# Patient Record
Sex: Female | Born: 1950
Health system: Southern US, Community
[De-identification: ages and names within clinical notes are randomized; demographics above are authoritative.]

## PROBLEM LIST (undated history)

## (undated) DIAGNOSIS — T8859XA Other complications of anesthesia, initial encounter: Secondary | ICD-10-CM

## (undated) DIAGNOSIS — G4730 Sleep apnea, unspecified: Secondary | ICD-10-CM

## (undated) DIAGNOSIS — J45909 Unspecified asthma, uncomplicated: Secondary | ICD-10-CM

## (undated) DIAGNOSIS — R112 Nausea with vomiting, unspecified: Secondary | ICD-10-CM

## (undated) DIAGNOSIS — I10 Essential (primary) hypertension: Secondary | ICD-10-CM

## (undated) DIAGNOSIS — Z0000 Encounter for general adult medical examination without abnormal findings: Secondary | ICD-10-CM

## (undated) DIAGNOSIS — B019 Varicella without complication: Secondary | ICD-10-CM

## (undated) DIAGNOSIS — I2510 Atherosclerotic heart disease of native coronary artery without angina pectoris: Secondary | ICD-10-CM

## (undated) DIAGNOSIS — T4145XA Adverse effect of unspecified anesthetic, initial encounter: Secondary | ICD-10-CM

## (undated) DIAGNOSIS — E049 Nontoxic goiter, unspecified: Secondary | ICD-10-CM

## (undated) DIAGNOSIS — Z87442 Personal history of urinary calculi: Secondary | ICD-10-CM

## (undated) DIAGNOSIS — Z9221 Personal history of antineoplastic chemotherapy: Secondary | ICD-10-CM

## (undated) DIAGNOSIS — K219 Gastro-esophageal reflux disease without esophagitis: Secondary | ICD-10-CM

## (undated) DIAGNOSIS — B059 Measles without complication: Secondary | ICD-10-CM

## (undated) DIAGNOSIS — K439 Ventral hernia without obstruction or gangrene: Secondary | ICD-10-CM

## (undated) DIAGNOSIS — J300 Vasomotor rhinitis: Secondary | ICD-10-CM

## (undated) DIAGNOSIS — F411 Generalized anxiety disorder: Secondary | ICD-10-CM

## (undated) DIAGNOSIS — M1710 Unilateral primary osteoarthritis, unspecified knee: Secondary | ICD-10-CM

## (undated) DIAGNOSIS — K5900 Constipation, unspecified: Secondary | ICD-10-CM

## (undated) DIAGNOSIS — Z860100 Personal history of colon polyps, unspecified: Secondary | ICD-10-CM

## (undated) DIAGNOSIS — G4700 Insomnia, unspecified: Secondary | ICD-10-CM

## (undated) DIAGNOSIS — F419 Anxiety disorder, unspecified: Secondary | ICD-10-CM

## (undated) DIAGNOSIS — R011 Cardiac murmur, unspecified: Secondary | ICD-10-CM

## (undated) DIAGNOSIS — E1165 Type 2 diabetes mellitus with hyperglycemia: Secondary | ICD-10-CM

## (undated) DIAGNOSIS — R062 Wheezing: Secondary | ICD-10-CM

## (undated) DIAGNOSIS — B269 Mumps without complication: Secondary | ICD-10-CM

## (undated) DIAGNOSIS — E785 Hyperlipidemia, unspecified: Secondary | ICD-10-CM

## (undated) DIAGNOSIS — I471 Supraventricular tachycardia: Secondary | ICD-10-CM

## (undated) DIAGNOSIS — Z923 Personal history of irradiation: Secondary | ICD-10-CM

## (undated) DIAGNOSIS — D649 Anemia, unspecified: Secondary | ICD-10-CM

## (undated) DIAGNOSIS — Z9889 Other specified postprocedural states: Secondary | ICD-10-CM

## (undated) DIAGNOSIS — B354 Tinea corporis: Secondary | ICD-10-CM

## (undated) DIAGNOSIS — M2550 Pain in unspecified joint: Secondary | ICD-10-CM

## (undated) DIAGNOSIS — IMO0001 Reserved for inherently not codable concepts without codable children: Secondary | ICD-10-CM

## (undated) DIAGNOSIS — C50412 Malignant neoplasm of upper-outer quadrant of left female breast: Secondary | ICD-10-CM

## (undated) DIAGNOSIS — I77819 Aortic ectasia, unspecified site: Secondary | ICD-10-CM

## (undated) DIAGNOSIS — R300 Dysuria: Secondary | ICD-10-CM

## (undated) DIAGNOSIS — C50919 Malignant neoplasm of unspecified site of unspecified female breast: Secondary | ICD-10-CM

## (undated) DIAGNOSIS — F32A Depression, unspecified: Secondary | ICD-10-CM

## (undated) DIAGNOSIS — I4719 Other supraventricular tachycardia: Secondary | ICD-10-CM

## (undated) DIAGNOSIS — I5032 Chronic diastolic (congestive) heart failure: Secondary | ICD-10-CM

## (undated) DIAGNOSIS — I77810 Thoracic aortic ectasia: Secondary | ICD-10-CM

## (undated) DIAGNOSIS — M549 Dorsalgia, unspecified: Secondary | ICD-10-CM

## (undated) DIAGNOSIS — I493 Ventricular premature depolarization: Secondary | ICD-10-CM

## (undated) DIAGNOSIS — E119 Type 2 diabetes mellitus without complications: Secondary | ICD-10-CM

## (undated) DIAGNOSIS — E669 Obesity, unspecified: Secondary | ICD-10-CM

## (undated) DIAGNOSIS — F329 Major depressive disorder, single episode, unspecified: Secondary | ICD-10-CM

## (undated) DIAGNOSIS — R945 Abnormal results of liver function studies: Secondary | ICD-10-CM

## (undated) HISTORY — DX: Unspecified asthma, uncomplicated: J45.909

## (undated) HISTORY — DX: Constipation, unspecified: K59.00

## (undated) HISTORY — DX: Atherosclerotic heart disease of native coronary artery without angina pectoris: I25.10

## (undated) HISTORY — DX: Major depressive disorder, single episode, unspecified: F32.9

## (undated) HISTORY — DX: Gastro-esophageal reflux disease without esophagitis: K21.9

## (undated) HISTORY — DX: Abnormal results of liver function studies: R94.5

## (undated) HISTORY — DX: Type 2 diabetes mellitus without complications: E11.9

## (undated) HISTORY — PX: CHOLECYSTECTOMY: SHX55

## (undated) HISTORY — DX: Anemia, unspecified: D64.9

## (undated) HISTORY — DX: Anxiety disorder, unspecified: F41.9

## (undated) HISTORY — DX: Pain in unspecified joint: M25.50

## (undated) HISTORY — DX: Other supraventricular tachycardia: I47.19

## (undated) HISTORY — PX: TUBAL LIGATION: SHX77

## (undated) HISTORY — DX: Ventricular premature depolarization: I49.3

## (undated) HISTORY — DX: Hyperlipidemia, unspecified: E78.5

## (undated) HISTORY — DX: Personal history of irradiation: Z92.3

## (undated) HISTORY — DX: Ventral hernia without obstruction or gangrene: K43.9

## (undated) HISTORY — DX: Thoracic aortic ectasia: I77.810

## (undated) HISTORY — DX: Supraventricular tachycardia: I47.1

## (undated) HISTORY — DX: Wheezing: R06.2

## (undated) HISTORY — DX: Measles without complication: B05.9

## (undated) HISTORY — DX: Encounter for general adult medical examination without abnormal findings: Z00.00

## (undated) HISTORY — DX: Malignant neoplasm of upper-outer quadrant of left female breast: C50.412

## (undated) HISTORY — DX: Personal history of colon polyps, unspecified: Z86.0100

## (undated) HISTORY — DX: Depression, unspecified: F32.A

## (undated) HISTORY — DX: Varicella without complication: B01.9

## (undated) HISTORY — PX: JOINT REPLACEMENT: SHX530

## (undated) HISTORY — DX: Tinea corporis: B35.4

## (undated) HISTORY — DX: Obesity, unspecified: E66.9

## (undated) HISTORY — DX: Personal history of colonic polyps: Z86.010

## (undated) HISTORY — DX: Essential (primary) hypertension: I10

## (undated) HISTORY — DX: Vasomotor rhinitis: J30.0

## (undated) HISTORY — DX: Mumps without complication: B26.9

## (undated) HISTORY — DX: Dorsalgia, unspecified: M54.9

## (undated) HISTORY — DX: Aortic ectasia, unspecified site: I77.819

## (undated) HISTORY — PX: CARDIAC CATHETERIZATION: SHX172

## (undated) HISTORY — PX: CARDIOVASCULAR STRESS TEST: SHX262

## (undated) HISTORY — DX: Generalized anxiety disorder: F41.1

## (undated) HISTORY — DX: Chronic diastolic (congestive) heart failure: I50.32

## (undated) HISTORY — DX: Dysuria: R30.0

## (undated) HISTORY — DX: Sleep apnea, unspecified: G47.30

## (undated) HISTORY — DX: Unilateral primary osteoarthritis, unspecified knee: M17.10

## (undated) HISTORY — DX: Malignant neoplasm of unspecified site of unspecified female breast: C50.919

## (undated) HISTORY — DX: Type 2 diabetes mellitus with hyperglycemia: E11.65

## (undated) HISTORY — DX: Insomnia, unspecified: G47.00

## (undated) HISTORY — PX: MOUTH SURGERY: SHX715

---

## 1998-06-24 HISTORY — PX: WISDOM TOOTH EXTRACTION: SHX21

## 1999-03-12 ENCOUNTER — Emergency Department (HOSPITAL_COMMUNITY): Admission: EM | Admit: 1999-03-12 | Discharge: 1999-03-12 | Payer: Self-pay | Admitting: Emergency Medicine

## 1999-11-14 ENCOUNTER — Encounter: Admission: RE | Admit: 1999-11-14 | Discharge: 1999-11-14 | Payer: Self-pay | Admitting: Obstetrics and Gynecology

## 1999-11-14 ENCOUNTER — Encounter: Payer: Self-pay | Admitting: Obstetrics and Gynecology

## 2001-03-12 ENCOUNTER — Encounter: Payer: Self-pay | Admitting: Obstetrics and Gynecology

## 2001-03-12 ENCOUNTER — Encounter: Admission: RE | Admit: 2001-03-12 | Discharge: 2001-03-12 | Payer: Self-pay | Admitting: Obstetrics and Gynecology

## 2001-10-21 ENCOUNTER — Encounter: Payer: Self-pay | Admitting: Internal Medicine

## 2002-04-12 ENCOUNTER — Encounter: Admission: RE | Admit: 2002-04-12 | Discharge: 2002-04-12 | Payer: Self-pay | Admitting: Internal Medicine

## 2002-04-12 ENCOUNTER — Encounter: Payer: Self-pay | Admitting: Internal Medicine

## 2002-08-17 ENCOUNTER — Encounter: Payer: Self-pay | Admitting: Internal Medicine

## 2002-09-29 ENCOUNTER — Encounter: Payer: Self-pay | Admitting: Cardiology

## 2002-09-29 ENCOUNTER — Ambulatory Visit (HOSPITAL_COMMUNITY): Admission: RE | Admit: 2002-09-29 | Discharge: 2002-09-29 | Payer: Self-pay | Admitting: Cardiology

## 2002-10-18 ENCOUNTER — Ambulatory Visit (HOSPITAL_COMMUNITY): Admission: RE | Admit: 2002-10-18 | Discharge: 2002-10-18 | Payer: Self-pay | Admitting: Cardiology

## 2002-10-18 ENCOUNTER — Encounter (INDEPENDENT_AMBULATORY_CARE_PROVIDER_SITE_OTHER): Payer: Self-pay | Admitting: Cardiology

## 2003-02-22 ENCOUNTER — Other Ambulatory Visit: Admission: RE | Admit: 2003-02-22 | Discharge: 2003-02-22 | Payer: Self-pay | Admitting: *Deleted

## 2004-03-13 ENCOUNTER — Other Ambulatory Visit: Admission: RE | Admit: 2004-03-13 | Discharge: 2004-03-13 | Payer: Self-pay | Admitting: Obstetrics and Gynecology

## 2004-05-02 ENCOUNTER — Ambulatory Visit: Payer: Self-pay | Admitting: Gastroenterology

## 2004-05-02 ENCOUNTER — Encounter: Payer: Self-pay | Admitting: Internal Medicine

## 2004-05-09 ENCOUNTER — Encounter (INDEPENDENT_AMBULATORY_CARE_PROVIDER_SITE_OTHER): Payer: Self-pay | Admitting: *Deleted

## 2004-05-09 ENCOUNTER — Ambulatory Visit (HOSPITAL_COMMUNITY): Admission: RE | Admit: 2004-05-09 | Discharge: 2004-05-09 | Payer: Self-pay | Admitting: Obstetrics and Gynecology

## 2004-06-01 ENCOUNTER — Encounter: Payer: Self-pay | Admitting: Internal Medicine

## 2004-06-01 ENCOUNTER — Ambulatory Visit: Payer: Self-pay | Admitting: Pulmonary Disease

## 2004-06-03 ENCOUNTER — Ambulatory Visit: Payer: Self-pay | Admitting: Pulmonary Disease

## 2004-06-03 ENCOUNTER — Ambulatory Visit (HOSPITAL_BASED_OUTPATIENT_CLINIC_OR_DEPARTMENT_OTHER): Admission: RE | Admit: 2004-06-03 | Discharge: 2004-06-03 | Payer: Self-pay | Admitting: Pulmonary Disease

## 2004-06-27 ENCOUNTER — Ambulatory Visit: Payer: Self-pay | Admitting: Pulmonary Disease

## 2004-07-26 ENCOUNTER — Ambulatory Visit: Payer: Self-pay | Admitting: Pulmonary Disease

## 2004-09-12 ENCOUNTER — Ambulatory Visit: Payer: Self-pay | Admitting: Internal Medicine

## 2004-09-24 ENCOUNTER — Ambulatory Visit: Payer: Self-pay | Admitting: Internal Medicine

## 2004-12-10 ENCOUNTER — Encounter (INDEPENDENT_AMBULATORY_CARE_PROVIDER_SITE_OTHER): Payer: Self-pay | Admitting: *Deleted

## 2004-12-10 ENCOUNTER — Encounter: Payer: Self-pay | Admitting: Internal Medicine

## 2004-12-10 ENCOUNTER — Ambulatory Visit (HOSPITAL_COMMUNITY): Admission: RE | Admit: 2004-12-10 | Discharge: 2004-12-10 | Payer: Self-pay | Admitting: Gastroenterology

## 2005-01-11 ENCOUNTER — Ambulatory Visit: Payer: Self-pay | Admitting: Internal Medicine

## 2005-04-17 ENCOUNTER — Ambulatory Visit: Payer: Self-pay | Admitting: Internal Medicine

## 2005-04-24 ENCOUNTER — Ambulatory Visit: Payer: Self-pay | Admitting: Internal Medicine

## 2005-05-08 ENCOUNTER — Other Ambulatory Visit: Admission: RE | Admit: 2005-05-08 | Discharge: 2005-05-08 | Payer: Self-pay | Admitting: Obstetrics and Gynecology

## 2005-05-15 ENCOUNTER — Ambulatory Visit: Payer: Self-pay | Admitting: Internal Medicine

## 2005-06-14 ENCOUNTER — Ambulatory Visit: Payer: Self-pay | Admitting: Internal Medicine

## 2005-08-05 ENCOUNTER — Ambulatory Visit: Payer: Self-pay | Admitting: Internal Medicine

## 2005-10-03 ENCOUNTER — Ambulatory Visit: Payer: Self-pay | Admitting: Internal Medicine

## 2006-01-22 ENCOUNTER — Ambulatory Visit: Payer: Self-pay | Admitting: Internal Medicine

## 2006-01-29 ENCOUNTER — Ambulatory Visit: Payer: Self-pay | Admitting: Internal Medicine

## 2006-05-20 ENCOUNTER — Emergency Department (HOSPITAL_COMMUNITY): Admission: EM | Admit: 2006-05-20 | Discharge: 2006-05-20 | Payer: Self-pay | Admitting: Emergency Medicine

## 2006-07-30 ENCOUNTER — Ambulatory Visit: Payer: Self-pay | Admitting: Internal Medicine

## 2006-07-30 LAB — CONVERTED CEMR LAB
ALT: 32 units/L (ref 0–40)
AST: 27 units/L (ref 0–37)
Albumin: 3.6 g/dL (ref 3.5–5.2)
Alkaline Phosphatase: 69 units/L (ref 39–117)
BUN: 17 mg/dL (ref 6–23)
Basophils Absolute: 0 10*3/uL (ref 0.0–0.1)
Basophils Relative: 0.4 % (ref 0.0–1.0)
Bilirubin, Direct: 0.1 mg/dL (ref 0.0–0.3)
CO2: 31 meq/L (ref 19–32)
Calcium: 9.6 mg/dL (ref 8.4–10.5)
Chloride: 102 meq/L (ref 96–112)
Cholesterol: 211 mg/dL (ref 0–200)
Creatinine, Ser: 0.8 mg/dL (ref 0.4–1.2)
Direct LDL: 130.6 mg/dL
Eosinophils Absolute: 0.1 10*3/uL (ref 0.0–0.6)
Eosinophils Relative: 1 % (ref 0.0–5.0)
GFR calc Af Amer: 96 mL/min
GFR calc non Af Amer: 79 mL/min
Glucose, Bld: 73 mg/dL (ref 70–99)
HCT: 40.1 % (ref 36.0–46.0)
HDL: 31.8 mg/dL — ABNORMAL LOW (ref >39.0)
Hemoglobin: 14.1 g/dL (ref 12.0–15.0)
Lymphocytes Relative: 30 % (ref 12.0–46.0)
MCHC: 35.2 g/dL (ref 30.0–36.0)
MCV: 90.7 fL (ref 78.0–100.0)
Monocytes Absolute: 0.3 10*3/uL (ref 0.2–0.7)
Monocytes Relative: 5.8 % (ref 3.0–11.0)
Neutro Abs: 3.4 10*3/uL (ref 1.4–7.7)
Neutrophils Relative %: 62.8 % (ref 43.0–77.0)
Platelets: 243 10*3/uL (ref 150–400)
Potassium: 3.7 meq/L (ref 3.5–5.1)
RBC: 4.42 M/uL (ref 3.87–5.11)
RDW: 12.7 % (ref 11.5–14.6)
Sodium: 143 meq/L (ref 135–145)
TSH: 1.66 microintl units/mL (ref 0.35–5.50)
Total Bilirubin: 0.6 mg/dL (ref 0.3–1.2)
Total CHOL/HDL Ratio: 6.6
Total Protein: 7.1 g/dL (ref 6.0–8.3)
Triglycerides: 193 mg/dL — ABNORMAL HIGH (ref 0–149)
VLDL: 39 mg/dL (ref 0–40)
WBC: 5.4 10*3/uL (ref 4.5–10.5)

## 2006-08-06 ENCOUNTER — Ambulatory Visit: Payer: Self-pay | Admitting: Internal Medicine

## 2006-08-20 ENCOUNTER — Encounter: Admission: RE | Admit: 2006-08-20 | Discharge: 2006-11-18 | Payer: Self-pay | Admitting: Internal Medicine

## 2006-09-17 ENCOUNTER — Ambulatory Visit: Payer: Self-pay | Admitting: Internal Medicine

## 2006-09-17 LAB — CONVERTED CEMR LAB
ALT: 28 units/L (ref 0–40)
AST: 22 units/L (ref 0–37)
Albumin: 3.7 g/dL (ref 3.5–5.2)
Alkaline Phosphatase: 70 units/L (ref 39–117)
Bilirubin, Direct: 0.1 mg/dL (ref 0.0–0.3)
Cholesterol: 180 mg/dL (ref 0–200)
Direct LDL: 114.7 mg/dL
HDL: 31.3 mg/dL — ABNORMAL LOW (ref >39.0)
Total Bilirubin: 0.7 mg/dL (ref 0.3–1.2)
Total CHOL/HDL Ratio: 5.8
Total Protein: 6.8 g/dL (ref 6.0–8.3)
Triglycerides: 218 mg/dL (ref 0–149)
VLDL: 44 mg/dL — ABNORMAL HIGH (ref 0–40)

## 2006-10-17 ENCOUNTER — Ambulatory Visit: Payer: Self-pay | Admitting: Internal Medicine

## 2007-03-26 ENCOUNTER — Telehealth: Payer: Self-pay | Admitting: Internal Medicine

## 2007-05-25 ENCOUNTER — Telehealth: Payer: Self-pay | Admitting: Internal Medicine

## 2007-06-25 HISTORY — PX: MENISCUS REPAIR: SHX5179

## 2007-06-25 LAB — CONVERTED CEMR LAB: Pap Smear: NORMAL

## 2007-09-02 ENCOUNTER — Ambulatory Visit: Payer: Self-pay | Admitting: Internal Medicine

## 2007-09-02 LAB — CONVERTED CEMR LAB
ALT: 25 units/L (ref 0–35)
Albumin: 3.8 g/dL (ref 3.5–5.2)
Alkaline Phosphatase: 81 units/L (ref 39–117)
BUN: 14 mg/dL (ref 6–23)
Basophils Absolute: 0 10*3/uL (ref 0.0–0.1)
Bilirubin, Direct: 0.1 mg/dL (ref 0.0–0.3)
Blood in Urine, dipstick: NEGATIVE
Calcium: 9.8 mg/dL (ref 8.4–10.5)
Chloride: 107 meq/L (ref 96–112)
Cholesterol: 190 mg/dL (ref 0–200)
Eosinophils Absolute: 0.1 10*3/uL (ref 0.0–0.6)
GFR calc Af Amer: 95 mL/min
GFR calc non Af Amer: 79 mL/min
Glucose, Bld: 98 mg/dL (ref 70–99)
HDL: 26.4 mg/dL — ABNORMAL LOW (ref >39.0)
Lymphocytes Relative: 22.8 % (ref 12.0–46.0)
MCHC: 33.7 g/dL (ref 30.0–36.0)
MCV: 90.6 fL (ref 78.0–100.0)
Monocytes Relative: 5.6 % (ref 3.0–11.0)
Neutro Abs: 5 10*3/uL (ref 1.4–7.7)
Nitrite: NEGATIVE
Platelets: 244 10*3/uL (ref 150–400)
RBC: 4.63 M/uL (ref 3.87–5.11)
Specific Gravity, Urine: 1.025
Triglycerides: 212 mg/dL (ref 0–149)
Urobilinogen, UA: 0.2

## 2007-09-11 DIAGNOSIS — N200 Calculus of kidney: Secondary | ICD-10-CM | POA: Insufficient documentation

## 2007-09-11 DIAGNOSIS — Z860100 Personal history of colon polyps, unspecified: Secondary | ICD-10-CM | POA: Insufficient documentation

## 2007-09-11 DIAGNOSIS — F329 Major depressive disorder, single episode, unspecified: Secondary | ICD-10-CM

## 2007-09-11 DIAGNOSIS — F419 Anxiety disorder, unspecified: Secondary | ICD-10-CM | POA: Insufficient documentation

## 2007-09-11 DIAGNOSIS — I10 Essential (primary) hypertension: Secondary | ICD-10-CM | POA: Insufficient documentation

## 2007-09-11 DIAGNOSIS — N281 Cyst of kidney, acquired: Secondary | ICD-10-CM | POA: Insufficient documentation

## 2007-09-11 DIAGNOSIS — F411 Generalized anxiety disorder: Secondary | ICD-10-CM

## 2007-09-11 DIAGNOSIS — K219 Gastro-esophageal reflux disease without esophagitis: Secondary | ICD-10-CM | POA: Insufficient documentation

## 2007-09-11 DIAGNOSIS — E785 Hyperlipidemia, unspecified: Secondary | ICD-10-CM | POA: Insufficient documentation

## 2007-09-11 DIAGNOSIS — F32A Depression, unspecified: Secondary | ICD-10-CM | POA: Insufficient documentation

## 2007-09-11 HISTORY — DX: Generalized anxiety disorder: F41.1

## 2007-09-14 ENCOUNTER — Ambulatory Visit: Payer: Self-pay | Admitting: Internal Medicine

## 2007-09-14 ENCOUNTER — Telehealth (INDEPENDENT_AMBULATORY_CARE_PROVIDER_SITE_OTHER): Payer: Self-pay | Admitting: *Deleted

## 2007-09-14 LAB — HM MAMMOGRAPHY

## 2007-11-06 ENCOUNTER — Ambulatory Visit: Payer: Self-pay | Admitting: Internal Medicine

## 2007-11-06 DIAGNOSIS — M25569 Pain in unspecified knee: Secondary | ICD-10-CM | POA: Insufficient documentation

## 2007-11-09 ENCOUNTER — Ambulatory Visit: Payer: Self-pay | Admitting: Internal Medicine

## 2007-11-11 ENCOUNTER — Telehealth (INDEPENDENT_AMBULATORY_CARE_PROVIDER_SITE_OTHER): Payer: Self-pay | Admitting: *Deleted

## 2007-11-27 ENCOUNTER — Ambulatory Visit (HOSPITAL_COMMUNITY): Admission: RE | Admit: 2007-11-27 | Discharge: 2007-11-27 | Payer: Self-pay | Admitting: Specialist

## 2007-12-06 ENCOUNTER — Emergency Department (HOSPITAL_COMMUNITY): Admission: EM | Admit: 2007-12-06 | Discharge: 2007-12-06 | Payer: Self-pay | Admitting: Family Medicine

## 2007-12-11 ENCOUNTER — Ambulatory Visit (HOSPITAL_BASED_OUTPATIENT_CLINIC_OR_DEPARTMENT_OTHER): Admission: RE | Admit: 2007-12-11 | Discharge: 2007-12-11 | Payer: Self-pay | Admitting: Specialist

## 2007-12-22 ENCOUNTER — Encounter: Admission: RE | Admit: 2007-12-22 | Discharge: 2008-03-01 | Payer: Self-pay | Admitting: Specialist

## 2008-06-08 ENCOUNTER — Encounter: Payer: Self-pay | Admitting: Internal Medicine

## 2008-06-14 ENCOUNTER — Encounter (HOSPITAL_COMMUNITY): Admission: RE | Admit: 2008-06-14 | Discharge: 2008-06-21 | Payer: Self-pay | Admitting: Cardiology

## 2008-06-24 LAB — HM COLONOSCOPY

## 2008-07-13 ENCOUNTER — Encounter: Payer: Self-pay | Admitting: Internal Medicine

## 2008-07-16 ENCOUNTER — Ambulatory Visit (HOSPITAL_BASED_OUTPATIENT_CLINIC_OR_DEPARTMENT_OTHER): Admission: RE | Admit: 2008-07-16 | Discharge: 2008-07-16 | Payer: Self-pay | Admitting: Cardiology

## 2008-07-23 ENCOUNTER — Ambulatory Visit: Payer: Self-pay | Admitting: Internal Medicine

## 2008-10-06 ENCOUNTER — Encounter: Payer: Self-pay | Admitting: Pulmonary Disease

## 2008-10-13 ENCOUNTER — Encounter: Payer: Self-pay | Admitting: Internal Medicine

## 2008-10-21 ENCOUNTER — Ambulatory Visit (HOSPITAL_COMMUNITY): Admission: RE | Admit: 2008-10-21 | Discharge: 2008-10-21 | Payer: Self-pay | Admitting: Cardiology

## 2008-10-26 ENCOUNTER — Ambulatory Visit (HOSPITAL_COMMUNITY): Admission: RE | Admit: 2008-10-26 | Discharge: 2008-10-26 | Payer: Self-pay | Admitting: Cardiology

## 2008-11-14 ENCOUNTER — Encounter: Payer: Self-pay | Admitting: Internal Medicine

## 2008-11-16 ENCOUNTER — Ambulatory Visit: Payer: Self-pay | Admitting: Internal Medicine

## 2008-11-16 LAB — CONVERTED CEMR LAB
Cholesterol, target level: 200 mg/dL
LDL Goal: 130 mg/dL

## 2008-11-22 LAB — CONVERTED CEMR LAB
Alkaline Phosphatase: 66 units/L (ref 39–117)
BUN: 25 mg/dL — ABNORMAL HIGH (ref 6–23)
Bilirubin, Direct: 0 mg/dL (ref 0.0–0.3)
Calcium: 9.6 mg/dL (ref 8.4–10.5)
Chloride: 107 meq/L (ref 96–112)
Cholesterol: 188 mg/dL (ref 0–200)
Creatinine, Ser: 0.8 mg/dL (ref 0.4–1.2)
LDL Cholesterol: 113 mg/dL — ABNORMAL HIGH (ref 0–99)
Rhuematoid fact SerPl-aCnc: <20 intl units/mL (ref 0.0–20.0)
Total CHOL/HDL Ratio: 5
Total Protein: 7.6 g/dL (ref 6.0–8.3)

## 2008-11-28 ENCOUNTER — Telehealth: Payer: Self-pay | Admitting: Internal Medicine

## 2008-11-29 ENCOUNTER — Telehealth: Payer: Self-pay | Admitting: Internal Medicine

## 2008-12-23 ENCOUNTER — Emergency Department (HOSPITAL_COMMUNITY): Admission: EM | Admit: 2008-12-23 | Discharge: 2008-12-23 | Payer: Self-pay | Admitting: Family Medicine

## 2009-01-27 ENCOUNTER — Telehealth: Payer: Self-pay | Admitting: Internal Medicine

## 2009-06-15 ENCOUNTER — Ambulatory Visit: Payer: Self-pay | Admitting: Internal Medicine

## 2009-06-21 LAB — CONVERTED CEMR LAB
Alkaline Phosphatase: 67 units/L (ref 39–117)
BUN: 19 mg/dL (ref 6–23)
Bilirubin, Direct: 0 mg/dL (ref 0.0–0.3)
CO2: 31 meq/L (ref 19–32)
Chloride: 104 meq/L (ref 96–112)
Creatinine, Ser: 0.8 mg/dL (ref 0.4–1.2)
LDL Cholesterol: 100 mg/dL — ABNORMAL HIGH (ref 0–99)
Total CHOL/HDL Ratio: 5

## 2009-06-24 HISTORY — PX: TOTAL KNEE ARTHROPLASTY: SHX125

## 2009-08-16 ENCOUNTER — Ambulatory Visit: Payer: Self-pay | Admitting: Family Medicine

## 2009-12-06 ENCOUNTER — Encounter: Payer: Self-pay | Admitting: Internal Medicine

## 2010-01-03 ENCOUNTER — Ambulatory Visit (HOSPITAL_COMMUNITY): Admission: RE | Admit: 2010-01-03 | Discharge: 2010-01-03 | Payer: Self-pay | Admitting: Gastroenterology

## 2010-02-06 ENCOUNTER — Telehealth: Payer: Self-pay | Admitting: Internal Medicine

## 2010-03-08 ENCOUNTER — Telehealth: Payer: Self-pay | Admitting: Internal Medicine

## 2010-03-09 ENCOUNTER — Emergency Department (HOSPITAL_COMMUNITY): Admission: EM | Admit: 2010-03-09 | Discharge: 2010-03-09 | Payer: Self-pay | Admitting: Family Medicine

## 2010-03-22 ENCOUNTER — Telehealth: Payer: Self-pay | Admitting: Internal Medicine

## 2010-03-27 ENCOUNTER — Ambulatory Visit: Payer: Self-pay | Admitting: Internal Medicine

## 2010-03-27 LAB — HM COLONOSCOPY

## 2010-04-18 ENCOUNTER — Ambulatory Visit (HOSPITAL_COMMUNITY): Admission: RE | Admit: 2010-04-18 | Discharge: 2010-04-18 | Payer: Self-pay | Admitting: Specialist

## 2010-04-27 ENCOUNTER — Inpatient Hospital Stay (HOSPITAL_COMMUNITY): Admission: RE | Admit: 2010-04-27 | Discharge: 2010-04-30 | Payer: Self-pay | Admitting: Specialist

## 2010-05-03 ENCOUNTER — Telehealth: Payer: Self-pay | Admitting: Internal Medicine

## 2010-05-21 ENCOUNTER — Encounter
Admission: RE | Admit: 2010-05-21 | Discharge: 2010-06-21 | Payer: Self-pay | Source: Home / Self Care | Attending: Specialist | Admitting: Specialist

## 2010-05-24 HISTORY — PX: KNEE ARTHROSCOPY: SUR90

## 2010-06-05 ENCOUNTER — Ambulatory Visit
Admission: RE | Admit: 2010-06-05 | Discharge: 2010-06-05 | Payer: Self-pay | Source: Home / Self Care | Attending: Specialist | Admitting: Specialist

## 2010-06-25 ENCOUNTER — Encounter
Admission: RE | Admit: 2010-06-25 | Discharge: 2010-07-24 | Payer: Self-pay | Source: Home / Self Care | Attending: Specialist | Admitting: Specialist

## 2010-07-26 NOTE — Assessment & Plan Note (Signed)
Summary: SURGICAL CLEARANCE PER DR SWORDS//CCM   Vital Signs:  Patient profile:   60 year old female Weight:      300 pounds BMI:     51.68 Temp:     98.7 degrees F oral Pulse rate:   60 / minute Pulse rhythm:   regular Resp:     12 per minute BP sitting:   160 / 94  (left arm) Cuff size:   large  Vitals Entered By: Sid Falcon LPN (March 27, 2010 8:34 AM)  Serial Vital Signs/Assessments:  Time      Position  BP       Pulse  Resp  Temp     By                     130/90                         Birdie Sons MD   History of Present Illness: referred for preop evaluation by dr Thomasena Edis She has endstage OA of left knee likely secondary to morbid obesity She is scheduled for surgery She has never had trouble with General Anesthesia she denies chest pain , SOB. She has DOE---chronic and unchanged  She uses a cane/crutch for ambulation  HTN---tolerating meds, monitoring BP at home 120s/mid-80s  All other systems reviewed and were negative   Current Problems (verified): 1)  Knee Pain  (ICD-719.46) 2)  Health Screening  (ICD-V70.0) 3)  Anxiety  (ICD-300.00) 4)  Renal Cyst  (ICD-593.2) 5)  Renal Calculus  (ICD-592.0) 6)  Hypertension  (ICD-401.9) 7)  Hyperlipidemia  (ICD-272.4) 8)  Gerd  (ICD-530.81) 9)  Colonic Polyps, Hx of  (ICD-V12.72)  Current Medications (verified): 1)  Aspir-81 81 Mg Tbec (Aspirin) .... Take 1 Tablet By Mouth Once A Day 2)  Celexa 20 Mg Tabs (Citalopram Hydrobromide) .... Take 1 Tablet By Mouth Once A Day 3)  Metoprolol Tartrate 50 Mg Tabs (Metoprolol Tartrate) .... Take 1 Tablet By Mouth Twice A Day 4)  Nexium 40 Mg Cpdr (Esomeprazole Magnesium) .... Take 1 Tab Each Morning 5)  Simvastatin 40 Mg Tabs (Simvastatin) .... Take 1 Tablet By Mouth At Bedtime 6)  Multivitamins   Tabs (Multiple Vitamin) .... Once Daily 7)  Hydralazine Hcl 25 Mg Tabs (Hydralazine Hcl) .Marland Kitchen.. 1 Two Times A Day 8)  Diclofenac Sodium 75 Mg Tbec (Diclofenac Sodium) .... One  Bid 9)  Losartan Potassium-Hctz 100-25 Mg Tabs (Losartan Potassium-Hctz) .... Take 1 Tablet By Mouth Once A Day 10)  Fluticasone Propionate 50 Mcg/act  Susp (Fluticasone Propionate) .... 2 Sprays Each Nostril Once Daily 11)  Hydrocodone-Homatropine 5-1.5 Mg/63ml Syrp (Hydrocodone-Homatropine) .... One Tsp By Mouth Q 4-6 Hours As Needed Cough  Allergies (verified): No Known Drug Allergies  Past History:  Past Medical History: Last updated: 09/11/2007 Colonic polyps, hx of GERD Hyperlipidemia Hypertension kidney stone "kidney cyst" Anxiety---insomnia  Past Surgical History: Last updated: 09/11/2007 Caesarean section x3 Cholecystectomy  Family History: Last updated: 11/16/2008 Father: MI age 63,  HTN< rheumatoid arthritis  alive 2002 Mother: thyroid problems,  high cholesterol  alive 2002 Siblings: 1 brother healthy Family History Hypertension Family History High cholesterol Family History of Colon CA 1st degree relative <60--grandmother and maternal aunt Father CAD Brother CAD  Social History: Last updated: 11/06/2007 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   Eyes:  pupils equal and pupils round.   Ears:  R ear normal and L ear normal.   Nose:  no external deformity and no external erythema.   Neck:  No deformities, masses, or tenderness noted. Chest Wall:  No deformities, masses, or tenderness noted. Lungs:  Normal respiratory effort, chest expands symmetrically. Lungs are clear to auscultation, no crackles or wheezes. Heart:  Normal rate and regular rhythm. S1 and S2 normal without gallop, murmur, click, rub or other extra sounds. Abdomen:  Actonel sounds, soft and nontender.  Morbidly  obese. Msk:  No deformity or scoliosis noted of thoracic or lumbar spine.   Neurologic:  cranial nerves II-XII intact and gait normal.   Skin:  turgor normal and color normal.   Psych:  good eye contact and not anxious appearing.     Impression & Recommendations:  Problem # 1:  KNEE PAIN (YQM-578.46) Assessment Deteriorated End stage OA scheduled for surgery she is cleared for surgery and sh accepts arr associated risks Her updated medication list for this problem includes:    Aspir-81 81 Mg Tbec (Aspirin) .Marland Kitchen... Take 1 tablet by mouth once a day    Diclofenac Sodium 75 Mg Tbec (Diclofenac sodium) ..... One two times a day  has also been evaluated by CV---cleared for surgery  Problem # 2:  HYPERTENSION (ICD-401.9)  repeat bp was ok continue current medications  Her updated medication list for this problem includes:    Metoprolol Tartrate 50 Mg Tabs (Metoprolol tartrate) .Marland Kitchen... Take 1 tablet by mouth twice a day    Hydralazine Hcl 25 Mg Tabs (Hydralazine hcl) .Marland Kitchen... 1 two times a day    Losartan Potassium-hctz 100-25 Mg Tabs (Losartan potassium-hctz) .Marland Kitchen... Take 1 tablet by mouth once a day  BP today: 160/94 Prior BP: 130/88 (08/16/2009)  Prior 10 Yr Risk Heart Disease: Not enough information (11/16/2008)  Labs Reviewed: K+: 3.9 (06/15/2009) Creat: : 0.8 (06/15/2009)   Chol: 172 (06/15/2009)   HDL: 32.80 (06/15/2009)   LDL: 100 (06/15/2009)   TG: 197.0 (06/15/2009)  Problem # 3:  HYPERLIPIDEMIA (ICD-272.4) controlled continue current medications  Her updated medication list for this problem includes:    Simvastatin 40 Mg Tabs (Simvastatin) .Marland Kitchen... Take 1 tablet by mouth at bedtime  Labs Reviewed: SGOT: 30 (06/15/2009)   SGPT: 33 (06/15/2009)  Lipid Goals: Chol Goal: 200 (11/16/2008)   HDL Goal: 40 (11/16/2008)   LDL Goal: 130 (11/16/2008)   TG Goal: 150 (11/16/2008)  Prior 10 Yr Risk Heart Disease: Not enough information (11/16/2008)   HDL:32.80 (06/15/2009), 35.20  (11/16/2008)  LDL:100 (06/15/2009), 113 (11/16/2008)  Chol:172 (06/15/2009), 188 (11/16/2008)  Trig:197.0 (06/15/2009), 197.0 (11/16/2008)  Complete Medication List: 1)  Aspir-81 81 Mg Tbec (Aspirin) .... Take 1 tablet by mouth once a day 2)  Celexa 20 Mg Tabs (Citalopram hydrobromide) .... Take 1 tablet by mouth once a day 3)  Metoprolol Tartrate 50 Mg Tabs (Metoprolol tartrate) .... Take 1 tablet by mouth twice a day 4)  Nexium 40 Mg Cpdr (Esomeprazole magnesium) .... Take 1 tab each morning 5)  Simvastatin 40 Mg Tabs (Simvastatin) .... Take 1 tablet by mouth at bedtime 6)  Multivitamins Tabs (Multiple vitamin) .... Once daily 7)  Hydralazine Hcl 25 Mg Tabs (Hydralazine hcl) .Marland Kitchen.. 1 two times a day 8)  Diclofenac Sodium 75 Mg Tbec (Diclofenac sodium) .... One bid 9)  Losartan Potassium-hctz 100-25 Mg Tabs (Losartan potassium-hctz) .... Take  1 tablet by mouth once a day 10)  Fluticasone Propionate 50 Mcg/act Susp (Fluticasone propionate) .... 2 sprays each nostril once daily  Preventive Care Screening  Colonoscopy:    Date:  12/22/2009    Next Due:  12/2019    Results:  normal-dr mann   Prescriptions: FLUTICASONE PROPIONATE 50 MCG/ACT  SUSP (FLUTICASONE PROPIONATE) 2 sprays each nostril once daily  #1 vial x 3   Entered and Authorized by:   Birdie Sons MD   Signed by:   Birdie Sons MD on 03/27/2010   Method used:   Electronically to        [facility]* (retail)       [address]       [address] Shipping/mailing       [place], [place]  [address]       Ph: [phone]       Fax: [phone]   RxID:   [id number] LOSARTAN POTASSIUM-HCTZ 100-25 MG TABS (LOSARTAN POTASSIUM-HCTZ) Take 1 tablet by mouth once a day  #90 x 3   Entered and Authorized by:   Birdie Sons MD   Signed by:   Birdie Sons MD on 03/27/2010   Method used:   Electronically to        [facility]* (retail)       [address]       [address]  Shipping/mailing       [place], [place]  [address]       Ph: [phone]       Fax: [phone]   RxID:   [id number] DICLOFENAC SODIUM 75 MG TBEC (DICLOFENAC SODIUM) One bid  #180 x 1   Entered and Authorized by:   Birdie Sons MD   Signed by:   Birdie Sons MD on 03/27/2010   Method used:   Electronically to        [facility]* (retail)       [address]       [address] Shipping/mailing       [place], [place]  [address]       Ph: [phone]       Fax: [phone]   RxID:   [id number] HYDRALAZINE HCL 25 MG TABS (HYDRALAZINE HCL) 1 two times a day  #180 Tablet x 3   Entered and Authorized by:   Birdie Sons MD   Signed by:   Birdie Sons MD on 03/27/2010   Method used:   Electronically to        [facility]* (retail)       [address]       [address] Shipping/mailing       [place], [place]  [address]       Ph: [phone]       Fax: [phone]   RxID:   [id number] SIMVASTATIN 40 MG TABS (SIMVASTATIN) Take 1 tablet by mouth at bedtime  #90 Tablet x 3   Entered and Authorized by:   Birdie Sons MD   Signed by:   Birdie Sons MD on 03/27/2010   Method used:   Electronically to        [facility]* (retail)       [address]       [address] Shipping/mailing       [place], [place]  [address]       Ph: [phone]  Fax: [phone]   RxID:   [id number] NEXIUM 40 MG CPDR (ESOMEPRAZOLE MAGNESIUM) Take 1 tab each morning  #90 Capsule x 3   Entered and Authorized by:   Birdie Sons MD   Signed by:   Birdie Sons MD on 03/27/2010   Method used:   Electronically to        [facility]* (retail)       [address]       [address] Shipping/mailing       [place], [place]  [address]       Ph: [phone]       Fax: [phone]   RxID:   [id number] METOPROLOL TARTRATE 50 MG TABS (METOPROLOL TARTRATE) Take 1 tablet by mouth twice a day  #180 Tablet x 3   Entered and  Authorized by:   Birdie Sons MD   Signed by:   Birdie Sons MD on 03/27/2010   Method used:   Electronically to        [facility]* (retail)       [address]       [address] Shipping/mailing       [place], [place]  [address]       Ph: [phone]       Fax: [phone]   RxID:   [id number] CELEXA 20 MG TABS (CITALOPRAM HYDROBROMIDE) Take 1 tablet by mouth once a day  #90 x 3   Entered and Authorized by:   Birdie Sons MD   Signed by:   Birdie Sons MD on 03/27/2010   Method used:   Electronically to        [facility]* (retail)       [address]       [address] Shipping/mailing       [place], [place]  [address]       Ph: [phone]       Fax: [phone]   RxID:   [id number]

## 2010-07-26 NOTE — Progress Notes (Signed)
Summary: diclofenac refill  Phone Note Refill Request Message from:  Fax from Pharmacy on March 08, 2010 5:12 PM  Refills Requested: Medication #1:  DICLOFENAC SODIUM 75 MG TBEC One bid Initial call taken by: Kern Reap CMA Duncan Dull),  March 08, 2010 5:13 PM    Prescriptions: DICLOFENAC SODIUM 75 MG TBEC (DICLOFENAC SODIUM) One bid  #60 Tablet x 0   Entered by:   Kern Reap CMA (AAMA)   Authorized by:   Birdie Sons MD   Signed by:   Kern Reap CMA (AAMA) on 03/08/2010   Method used:   Electronically to        [facility]* (retail)       [address]       [address] Shipping/mailing       [place], [place]  [address]       Ph: [phone]       Fax: [phone]   RxID:   [id number]

## 2010-07-26 NOTE — Progress Notes (Signed)
Summary: needs approval  Phone Note Call from Patient Call back at Work Phone [phone]   Caller: Patient--live call Summary of Call: need status of approval to have knee replacement. A form was dropped off to Dr Cato Mulligan on Friday. Initial call taken by: Warnell Forester,  March 22, 2010 9:52 AM  Follow-up for Phone Call        need OV for surgical clearance Follow-up by: Birdie Sons MD,  March 23, 2010 2:07 PM  Additional Follow-up for Phone Call Additional follow up Details #1::        Pt will come on Tuesday, 03-27-2010. Additional Follow-up by: Warnell Forester,  March 23, 2010 2:14 PM

## 2010-07-26 NOTE — Progress Notes (Signed)
Summary: Refill Diclofenac SOD  Phone Note Refill Request Message from:  Pharmacy on February 06, 2010 4:22 PM  Refills Requested: Medication #1:  DICLOFENAC SODIUM 75 MG TBEC One bid   Dosage confirmed as above?Dosage Confirmed Initial call taken by: Kathrynn Speed CMA,  February 06, 2010 4:23 PM    Prescriptions: DICLOFENAC SODIUM 75 MG TBEC (DICLOFENAC SODIUM) One bid  #60 Tablet x 0   Entered by:   Kathrynn Speed CMA   Authorized by:   Birdie Sons MD   Signed by:   Kathrynn Speed CMA on 02/06/2010   Method used:   Electronically to        [facility]* (retail)       [address]       [address] Shipping/mailing       [place], [place]  [address]       Ph: [phone]       Fax: [phone]   RxID:   [id number]

## 2010-07-26 NOTE — Progress Notes (Signed)
  Phone Note Call from Patient   Caller: Patient Call For: [name] Summary of Call: Pt had total knee replacement and wants a prescription of Xanax for anxiety. [phone] Initial call taken by: [facility] CMA AAMA,  May 03, 2010 1:04 PM  Follow-up for Phone Call        alprazolam 0.25 mg 1/2 - 1 by mouth every other day as needed anxiety. #15/0 refills Follow-up by: [name],  May 03, 2010 4:19 PM  Additional Follow-up for Phone Call Additional follow up Details #1::        Phone Call Completed, Rx Called In Additional Follow-up by: Alfred Levins, CMA,  May 04, 2010 8:46 AM    New/Updated Medications: ALPRAZOLAM 0.25 MG TABS (ALPRAZOLAM) 1 by mouth every other day as needed for anxiety Prescriptions: ALPRAZOLAM 0.25 MG TABS (ALPRAZOLAM) 1 by mouth every other day as needed for anxiety  #15 x 0   Entered by:   Alfred Levins, CMA   Authorized by:   [name]   Signed by:   Alfred Levins, CMA on 05/04/2010   Method used:   Telephoned to ...       [facility]* (retail)       [address]       [address] Shipping/mailing       [place], [place]  [address]       Ph: [phone]       Fax: [phone]   RxID:   [id number]

## 2010-07-26 NOTE — Assessment & Plan Note (Signed)
Summary: CONCERNED ABOUT STREP THROAT / SINUSITIS // RS   Vital Signs:  Patient profile:   60 year old female Temp:     98.8 degrees F oral BP sitting:   130 / 88  (left arm) Cuff size:   large  Vitals Entered By: Sid Falcon LPN (August 16, 2009 2:18 PM) CC: concerned about strep throat, cough   History of Present Illness: Acute visit. Onset Sunday of cough. Temperature up to 101 at night. Moderate to severe sore throat at times. Cough mostly nonproductive. Fever mostly at night.  has taken Delsym cough syrup without relief. Denies any nausea, vomiting, or diarrhea.  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  Otoscopic examination reveals clear canals, tympanic membranes are intact bilaterally without bulging, retraction, inflammation or discharge. Hearing is grossly normal bilaterally. Nose:  minimal nasal mucosa erythema otherwise normal Mouth:  Oral mucosa and oropharynx without lesions or exudates.  Teeth in good repair. Neck:  No deformities, masses, or tenderness noted. Lungs:  Normal respiratory effort, chest expands symmetrically. Lungs are clear to auscultation, no crackles or wheezes. Heart:  Normal rate and regular rhythm. S1 and S2 normal without gallop, murmur, click, rub or other extra sounds.   Impression & Recommendations:  Problem # 1:  VIRAL URI  (ICD-465.9) rapid strep negative. Prescribed cough medication for symptom relief Her updated medication list for this problem includes:    Aspir-81 81 Mg Tbec (Aspirin) .Marland Kitchen... Take 1 tablet by mouth once a day    Diclofenac Sodium 75 Mg Tbec (Diclofenac sodium) ..... One bid    Hydrocodone-homatropine 5-1.5 Mg/53ml Syrp (Hydrocodone-homatropine) ..... One tsp by mouth q 4-6 hours as needed cough  Complete Medication List: 1)  Aspir-81 81 Mg Tbec (Aspirin) .... Take 1 tablet by mouth once a day 2)  Celexa 20 Mg Tabs (Citalopram hydrobromide) .... Take 1 tablet by mouth once a day 3)  Metoprolol Tartrate 50 Mg Tabs (Metoprolol tartrate) .... Take 1 tablet by mouth twice a day 4)  Nexium 40 Mg Cpdr (Esomeprazole magnesium) .... Take 1 tab each morning 5)  Simvastatin 40 Mg Tabs (Simvastatin) .... Take 1 tablet by mouth at bedtime 6)  Multivitamins Tabs (Multiple vitamin) .... Once daily 7)  Hydralazine Hcl 25 Mg Tabs (Hydralazine hcl) .Marland Kitchen.. 1 two times a day 8)  Diclofenac Sodium 75 Mg Tbec (Diclofenac sodium) .... One bid 9)  Losartan Potassium-hctz 100-25 Mg Tabs (Losartan potassium-hctz) .... Take 1 tablet by mouth once a day 10)  Fluticasone Propionate 50 Mcg/act Susp (Fluticasone propionate) .... 2 sprays each nostril once daily 11)  Hydrocodone-homatropine 5-1.5 Mg/44ml Syrp (Hydrocodone-homatropine) .... One tsp by mouth q 4-6 hours as needed cough  Other Orders: Rapid Strep ([address])  Patient Instructions: 1)  Your rapid strep test was negative 2)  Get plenty of rest, drink lots of clear liquids, and use Tylenol or Ibuprofen for fever and comfort. Return in 7-10 days if you're not better: sooner if you'er feeling worse.  3)  Prescribed cough  medication may be sedating.  Avoid use with driving or work Prescriptions: HYDROCODONE-HOMATROPINE 5-1.5 MG/5ML SYRP (HYDROCODONE-HOMATROPINE) one tsp by mouth q 4-6 hours as needed cough  #120 ml x 0   Entered and Authorized by:   Evelena Peat  MD   Signed by:   Evelena Peat MD on 08/16/2009   Method used:   Print then Give to Patient   RxID:   [id number]   Laboratory Results    Other Tests  Rapid Strep: negative Comments: Rita Ohara  August 16, 2009 2:52 PM   Kit Test Internal QC: Negative   (Normal Range: Negative)

## 2010-08-23 LAB — HM PAP SMEAR

## 2010-09-04 LAB — BASIC METABOLIC PANEL
BUN: 12 mg/dL (ref 6–23)
BUN: 9 mg/dL (ref 6–23)
CO2: 28 mEq/L (ref 19–32)
Chloride: 100 mEq/L (ref 96–112)
GFR calc non Af Amer: 41 mL/min — ABNORMAL LOW (ref >60)
Glucose, Bld: 140 mg/dL — ABNORMAL HIGH (ref 70–99)
Glucose, Bld: 166 mg/dL — ABNORMAL HIGH (ref 70–99)
Potassium: 3.9 mEq/L (ref 3.5–5.1)
Potassium: 4 mEq/L (ref 3.5–5.1)

## 2010-09-04 LAB — CBC
HCT: 31.9 % — ABNORMAL LOW (ref 36.0–46.0)
HCT: 33.3 % — ABNORMAL LOW (ref 36.0–46.0)
HCT: 33.3 % — ABNORMAL LOW (ref 36.0–46.0)
MCH: 31 pg (ref 26.0–34.0)
MCHC: 33.7 g/dL (ref 30.0–36.0)
MCV: 91.8 fL (ref 78.0–100.0)
MCV: 91.9 fL (ref 78.0–100.0)
MCV: 92.3 fL (ref 78.0–100.0)
Platelets: 244 10*3/uL (ref 150–400)
RBC: 3.45 MIL/uL — ABNORMAL LOW (ref 3.87–5.11)
RDW: 13.5 % (ref 11.5–15.5)
RDW: 14 % (ref 11.5–15.5)
WBC: 13.8 10*3/uL — ABNORMAL HIGH (ref 4.0–10.5)
WBC: 8.2 10*3/uL (ref 4.0–10.5)

## 2010-09-04 LAB — POCT I-STAT 4, (NA,K, GLUC, HGB,HCT)
HCT: 45 % (ref 36.0–46.0)
Hemoglobin: 15.3 g/dL — ABNORMAL HIGH (ref 12.0–15.0)
Potassium: 3.7 mEq/L (ref 3.5–5.1)
Sodium: 141 mEq/L (ref 135–145)

## 2010-09-04 LAB — URINALYSIS, ROUTINE W REFLEX MICROSCOPIC
Nitrite: NEGATIVE
Specific Gravity, Urine: 1.02 (ref 1.005–1.030)
pH: 5.5 (ref 5.0–8.0)

## 2010-09-04 LAB — ABO/RH: ABO/RH(D): A POS

## 2010-09-05 LAB — URINALYSIS, ROUTINE W REFLEX MICROSCOPIC
Bilirubin Urine: NEGATIVE
Glucose, UA: NEGATIVE mg/dL
Hgb urine dipstick: NEGATIVE
Nitrite: NEGATIVE
Protein, ur: NEGATIVE mg/dL
Specific Gravity, Urine: 1.028 (ref 1.005–1.030)
Urobilinogen, UA: 1 mg/dL (ref 0.0–1.0)
pH: 6.5 (ref 5.0–8.0)

## 2010-09-05 LAB — COMPREHENSIVE METABOLIC PANEL
CO2: 31 mEq/L (ref 19–32)
Calcium: 9.9 mg/dL (ref 8.4–10.5)
Creatinine, Ser: 0.78 mg/dL (ref 0.4–1.2)
GFR calc non Af Amer: >60 mL/min (ref >60)
Glucose, Bld: 89 mg/dL (ref 70–99)
Total Bilirubin: 0.6 mg/dL (ref 0.3–1.2)

## 2010-09-05 LAB — CBC
HCT: 40.5 % (ref 36.0–46.0)
Hemoglobin: 14 g/dL (ref 12.0–15.0)
MCH: 31.4 pg (ref 26.0–34.0)
MCHC: 34.5 g/dL (ref 30.0–36.0)
MCV: 91 fL (ref 78.0–100.0)

## 2010-09-05 LAB — SURGICAL PCR SCREEN
MRSA, PCR: NEGATIVE
Staphylococcus aureus: NEGATIVE

## 2010-09-05 LAB — URINE MICROSCOPIC-ADD ON

## 2010-09-05 LAB — PROTIME-INR
INR: 0.89 (ref 0.00–1.49)
Prothrombin Time: 12.3 seconds (ref 11.6–15.2)

## 2010-09-05 LAB — DIFFERENTIAL
Basophils Absolute: 0 10*3/uL (ref 0.0–0.1)
Eosinophils Absolute: 0 10*3/uL (ref 0.0–0.7)
Lymphocytes Relative: 23 % (ref 12–46)
Lymphs Abs: 2 10*3/uL (ref 0.7–4.0)
Neutrophils Relative %: 71 % (ref 43–77)

## 2010-09-30 LAB — CULTURE, ROUTINE-ABSCESS

## 2010-10-03 LAB — CBC
HCT: 41.8 % (ref 36.0–46.0)
Hemoglobin: 14.6 g/dL (ref 12.0–15.0)
MCV: 91.4 fL (ref 78.0–100.0)
Platelets: 218 10*3/uL (ref 150–400)
WBC: 5.5 10*3/uL (ref 4.0–10.5)

## 2010-10-03 LAB — BASIC METABOLIC PANEL
BUN: 14 mg/dL (ref 6–23)
Chloride: 103 mEq/L (ref 96–112)
GFR calc non Af Amer: >60 mL/min (ref >60)
Potassium: 4.1 mEq/L (ref 3.5–5.1)
Sodium: 141 mEq/L (ref 135–145)

## 2010-10-03 LAB — URINALYSIS, ROUTINE W REFLEX MICROSCOPIC
Glucose, UA: NEGATIVE mg/dL
Nitrite: NEGATIVE
Specific Gravity, Urine: 1.017 (ref 1.005–1.030)
pH: 7 (ref 5.0–8.0)

## 2010-10-03 LAB — APTT: aPTT: 31 seconds (ref 24–37)

## 2010-10-10 ENCOUNTER — Encounter: Payer: Self-pay | Admitting: Internal Medicine

## 2010-10-11 ENCOUNTER — Ambulatory Visit (INDEPENDENT_AMBULATORY_CARE_PROVIDER_SITE_OTHER): Payer: 59 | Admitting: Internal Medicine

## 2010-10-11 ENCOUNTER — Encounter: Payer: Self-pay | Admitting: Internal Medicine

## 2010-10-11 DIAGNOSIS — K469 Unspecified abdominal hernia without obstruction or gangrene: Secondary | ICD-10-CM

## 2010-10-13 ENCOUNTER — Encounter: Payer: Self-pay | Admitting: Internal Medicine

## 2010-10-13 DIAGNOSIS — K469 Unspecified abdominal hernia without obstruction or gangrene: Secondary | ICD-10-CM | POA: Insufficient documentation

## 2010-10-13 NOTE — Progress Notes (Signed)
  Subjective:    [name], female    DOB: 1951/01/14, 60 y.o.   MRN: [id number]  HPI Patient presents to clinic for evaluation of possible hernia. Notes 2 week history of not noted above the umbilicus. Notices more upon standing. Area is nontender and is not changing in size. There's been no injury or trauma to the area. No erythema or warmth noted in the overlying skin. Denies fever or chills. No exacerbating or alleviating factors. Recently status post left TKR is doing well ambulating without assistance and denies falls.Denies other complaint.  Reviewed past medical history, medications and allergies  Review of Systems see history of present illness     Objective:   Physical Exam  [nursing notereviewed. Constitutional: She appears well-developed and well-nourished. No distress.  HENT:  Head: Normocephalic and atraumatic.  Right Ear: External ear normal.  Left Ear: External ear normal.  Eyes: Conjunctivae are normal. No scleral icterus.  Abdominal: Soft. Bowel sounds are normal. She exhibits no distension. There is no tenderness. There is no rebound and no guarding.       Mild soft tissue prominence noted above umbilicus. More noticeable upon standing. nontender and not mobile.  Neurological: She is alert.  Skin: Skin is warm and dry. She is not diaphoretic.  Psychiatric: She has a normal mood and affect.          Assessment & Plan:

## 2010-10-13 NOTE — Assessment & Plan Note (Signed)
Consider possible mild hernia symptoms versus soft tissue prominence. Given lack of symptoms recommend close observation. If area becomes tender or painful red hot or warm recommend immediate evaluation and possible surgical consultation. States understanding and agreement.

## 2010-11-06 NOTE — Op Note (Signed)
NAMEMELVA, FAUX             ACCOUNT NO.:  [id number]   MEDICAL RECORD NO.:  [id number]          PATIENT TYPE:  AMB   LOCATION:  [facility]                         FACILITY:  [facility]   PHYSICIAN:  Erasmo Leventhal, M.D.DATE OF BIRTH:  April 28, 1951   DATE OF PROCEDURE:  DATE OF DISCHARGE:                               OPERATIVE REPORT   PREOPERATIVE DIAGNOSIS:  Left knee torn medial meniscus, early  osteoarthritis.   POSTOPERATIVE DIAGNOSIS:  1. Left knee complex tear, posterior horn medial meniscus, early      osteoarthritis with grade 3 chondromalacia of the medial femoral      condyle.  2. Grade 3 femoral trochlea.  3. Grade 2 patella.   PROCEDURE:  Left knee posterior medial meniscectomy, chondroplasty of  femoral trochlea, chondroplasty of the patella, and chondroplasty of the  medial femoral condyle.   SURGEON:  Erasmo Leventhal, M.D.   ASSISTANT:  Jaquelyn Bitter. Chabon, P.A.   ANESTHESIA:  Local block with general.   ESTIMATED BLOOD LOSS:  10 cc.   DRAINS:  None.   COMPLICATIONS:  None.   TOURNIQUET TIME:  None.   DISPOSITION:  To PACU stable.   OPERATIVE DETAILS:  Patient and family counseled in the holding area.  Block had been administered.  IV antibiotics had been given.  Taken to  the operating room, placed in supine position under anesthesia.  The  left lower extremity was placed in a thigh holder.  The right leg was  placed in clinical position, well-padded, and bumped.  The left foot was  exsanguinated and prepped with DuraPrep and draped in a sterile fashion.   Arthroscopic portals were established, proximal medial, anterior medial,  and anterolateral.  Diagnostic arthroscopy with the following findings:  Normal patellofemoral tracking, grade 2 chondromalacia of the patella,  light mechanical chondroplasty performed, grade 3 femoral trochlea, 2 x  1 cm, light chondroplasty performed.  Hemorrhage was present in the  ligamentum mucosum.  It was  debrided back to healthy tissue.  ACL and  PCL appeared to be intact.  The lateral side was inspected with normal  articular cartilage on the lateral meniscus.  The medial side was  inspected.  There was grade 3 chondromalacia of the medial femoral  condyle, some unstable flaps.  Mechanical chondroplasty was performed  back to a stable base.  There was a large complex tear of the posterior  horn of the medial meniscus.  It was repaired, we utilized baskets and  motorized shaver.  A partial medial meniscectomy was performed back to a  stable base, properly beveled and contoured, and this smoothed down with  the ArthroCare MeniVac system.  I was very careful to touch the meniscus  only and not the articular cartilage of the knee back.  The knee was  then sequentially reinspected.  There were no other abnormalities noted.  All irrigation and arthroscopic equipment was removed.  The three  portals were closed with a #1 suture, and another 25 cc of 1:5% Marcaine  with epi, and 4 mg of morphine sulfate was injected into the knee for  planned hemostasis.  A sterile dressing was applied to the knee with TED  hose and an ice pack.  No complications.  She was awakened and taken to  the operating room and PACU in stable condition.   PLAN:  Stabilization to PACU and overnight stay due to sleep apnea.           ______________________________  Erasmo Leventhal, M.D.     RAC/MEDQ  D:  12/11/2007  T:  12/11/2007  Job:  [id number]

## 2010-11-06 NOTE — Cardiovascular Report (Signed)
[name], [name]             ACCOUNT NO.:  [id number]   MEDICAL RECORD NO.:  [id number]          PATIENT TYPE:  OIB   LOCATION:  2899                         FACILITY:  [facility]   PHYSICIAN:  Sheliah Mends, MD      DATE OF BIRTH:  Feb 05, 1951   DATE OF PROCEDURE:  10/26/2008  DATE OF DISCHARGE:  10/26/2008                            CARDIAC CATHETERIZATION   INDICATIONS:  [name] is a 61 year old lady with strong family  history of coronary artery disease, abnormal myocardial perfusion scan,  complains of chest pain and multiple risk factors for coronary artery  disease.  Given her symptoms, risk factors and abnormal ancillary  studies, the decision was made to proceed with left heart  catheterization.   PROCEDURE:  After informed consent was obtained, the patient was brought  to the Second Floor Cardiac Catheterization Lab at [facility]  in the postabsorptive state.  She was draped in a sterile fashion and a  5-French arterial sheath was placed using local anesthesia with 1%  Xylocaine.  Conscious sedation was started using Versed and fentanyl.  Subsequently, left heart catheterization including coronary angiography  and left ventriculography was performed using a 5-French Judkins 4 left  and right catheter as well as a pigtail catheter.   FINDINGS:  Left main artery.  The left main artery is a medium-size  vessel that bifurcates into the LAD and left circumflex artery.  The  left main coronary artery has a mild, 10% proximal lesion but no  hemodynamic significant lesions.   Left anterior descending artery.  The left anterior descending artery is  a medium-size artery that gives off a diagonal 1 and diagonal 2 branch.  The LAD has a mild, 20% lesion in the mid segment.  The diagonal 1 is a  medium size artery and the diagonal 2 is a small artery.  There is no  angiographic significant disease noted in the diagonal arteries.   Left circumflex artery.  The left  circumflex artery is a medium-size  vessel.  It gives a small obtuse marginal branch off.  There is a 10%  mild lesion in the mid segment of the left circumflex artery.  There is  no hemodynamic significant disease seen.   RCA.  The RCA is a medium sized vessel that gives off a PDA.  There is a  10% proximal lesion noted but no hemodynamic significant disease.   HEMODYNAMICS:  Left ventricular pressure 145/14, the aortic pressure  150/18 mmHg.   The left ventriculogram was obtained in the RAO projection and shows  normal LV function with an ejection fraction of 65%.   Aortic root.  The aortic root appears normal.   SUMMARY:  1. Mild coronary artery disease without hemodynamic significant      lesion.  2. Mildly elevated end-diastolic pressures.  3. Normal aortic root anatomy.   The procedure was completed without complications.      Sheliah Mends, MD  Electronically Signed     JE/MEDQ  D:  10/26/2008  T:  10/27/2008  Job:  [id number]

## 2010-11-06 NOTE — Procedures (Signed)
NAME:  [name], [name]             ACCOUNT NO.:  [id number]   MEDICAL RECORD NO.:  [id number]          PATIENT TYPE:  OUT   LOCATION:  SLEEP CENTER                 FACILITY:  [facility] [place] [facility]   PHYSICIAN:  Clinton D. Maple Hudson, MD, FCCP, FACPDATE OF BIRTH:  Dec 30, 1950   DATE OF STUDY:  07/16/2008                            NOCTURNAL POLYSOMNOGRAM   REFERRING PHYSICIAN:  Sheliah Mends, MD   INDICATION FOR STUDY:  Hypersomnia with sleep apnea.   EPWORTH SLEEPINESS SCORE:  Epworth sleepiness score 10/24.  BMI 46.  Weight 280 pounds.  Height 65 inches.  Neck 17 inches.   MEDICATIONS:  Home medications charted and reviewed.  A baseline  diagnostic NPSG on June 03, 2004, recorded an AHI of 40 per hour.  CPAP titration is requested.   SLEEP ARCHITECTURE:  Total sleep time 222 minutes with sleep efficiency  57.9%.  Stage I was 32%.  Stage II 68%.  Stages III and REM were absent.  Sleep latency 6.5 minutes.  Awake after sleep onset 150 minutes.  Arousal index 12.4.  No bedtime medication was taken.  Sleep was  markedly fragmented until nearly 2 a.m. with a sustained interval of  wakefulness between 3 and 3:45 a.m.   RESPIRATORY DATA:  CPAP titration protocol.  Titration was quite  difficult because of the prolonged multiple wakings.  Recommend initial  trial at 20 CWP, AHI 0 per hour.  She chose a medium ResMed full face  Quattro mask with heated humidifier.   OXYGEN DATA:  Snoring was suppressed by CPAP with mean oxygen saturation  on CPAP control at 94.7% on room air.   CARDIAC DATA:  Normal sinus rhythm.   MOVEMENT-PARASOMNIA:  No significant movement disturbance.  No bathroom  trips.   IMPRESSIONS-RECOMMENDATIONS:  1. Difficult continuous positive airway pressure titration because of      frequent and prolonged wakings.  The technician tried pressures up      to 24 centimeters of water pressure and tried some bilevel      pressures. Suggest initial trial at 20 centimeters of water  pressure, apnea-hypopnea index 0 per hour.  She used a medium      ResMed full face Quattro mask with heated humidifier.  2. Baseline diagnostic nocturnal polysomnogram on June 03, 2004,      had recorded an apnea-hypopnea index of 40 per hour.  The patient      had reported that she had tried continuous positive airway pressure      after her initial sleep study, but could not tolerate it because of      noisy machine.  3. Sleep architecture was markedly fragmented with difficulty in      maintaining sleep.  Consider providing an      appropriate sedative hypnotic during initial adjustment to home      continuous positive airway pressure, if clinically indicated.      Clinton D. Maple Hudson, MD, [facility], FACP  Diplomate, Biomedical engineer of Sleep Medicine  Electronically Signed     CDY/MEDQ  D:  07/23/2008 13:05:41  T:  07/24/2008 01:57:48  Job:  [id number]

## 2010-11-09 NOTE — Procedures (Signed)
NAMELORILEE, [name]             ACCOUNT NO.:  [id number]   MEDICAL RECORD NO.:  [id number]          PATIENT TYPE:  OUT   LOCATION:  SLEEP CENTER                 FACILITY[facility]  [facility]   PHYSICIAN:  Marcelyn Bruins, M.D. [facility] DATE OF BIRTH:  06/20/1951   DATE OF STUDY:  06/03/2004                              NOCTURNAL POLYSOMNOGRAM   REFERRING PHYSICIAN:  Marcelyn Bruins, MD   INDICATION FOR STUDY:  Hypersomnia with sleep apnea.  Epworth score is 14.   SLEEP ARCHITECTURE:  The patient had a total sleep time of 355 minutes but  never achieved REM or slow wave sleep.  Sleep efficiency was only 76%.  Sleep onset latency was prolonged at 42 minutes.   IMPRESSION:  1.  Severe obstructive sleep apnea/hypopnea syndrome with a respiratory      disturbance index of 40 events per hour and desaturation as low as 87%.      There was no REM or slow wave sleep achieved.  Events were not      positional.  2.  Very loud snoring noted throughout the study.  3.  No clinical significant cardiac arrhythmias.  4.  Small numbers of leg jerks with some sleep disruption.  Clinical      correlation is suggested if the patient fails to respond adequately to      treatment of her obstructive sleep apnea.      KC/MEDQ  D:  06/11/2004 13:59:56  T:  06/11/2004 22:32:44  Job:  [id number]

## 2010-11-09 NOTE — Op Note (Signed)
[name], [name]             ACCOUNT NO.:  [id number]   MEDICAL RECORD NO.:  [id number]          PATIENT TYPE:  AMB   LOCATION:  SDC                           [facility]  [facility]   PHYSICIAN:  Guy Sandifer. Tomblin II, M.D.DATE OF BIRTH:  [birth year]   DATE OF PROCEDURE:  05/09/2004  DATE OF DISCHARGE:                                 OPERATIVE REPORT   PREOPERATIVE DIAGNOSIS:  Postmenopausal bleeding.   POSTOPERATIVE DIAGNOSIS:  Postmenopausal bleeding.   PROCEDURE:  Hysteroscopy with dilatation and curettage and 1% Xylocaine  paracervical block.   SURGEON:  Guy Sandifer. Henderson Cloud, M.D.   ANESTHESIA:  LMA converted to general with endotracheal intubation, Raul Del, M.D.   ESTIMATED BLOOD LOSS:  Less than 50 mL.   SPECIMENS:  Endometrial curettings.   INTAKE AND OUTPUT OF DISTENDING MEDIA:  400 mL deficit with some of that on  the floor.   INDICATIONS AND CONSENT:  This patient is a 60 year old lady with irregular  perimenopausal bleeding.  Details are dictated in the history and physical.  Hysteroscopy with resectoscope, D&C is discussed with the patient.  The  potential risks and complications have been discussed, including but not  limited to infection, bowel, bladder, or ureteral damage, uterine  perforation, bleeding requiring transfusion of blood products with possible  transfusion reaction, HIV and hepatitis acquisition, DVT, PE, pneumonia,  hysterectomy, and recurrent abnormal bleeding.  All questions are answered  and consent is signed on the chart.   FINDINGS:  There is approximately a 1 cm submucous structure consistent with  a fibroid in the center of the anterior endometrial canal.   PROCEDURE:  The patient is taken to the operating room, where she is placed  in a dorsal supine position and general anesthesia is induced via LMA.  She  is then placed in the dorsal lithotomy position, where she is prepped, the  bladder is straight-catheterized, and she is draped  in a sterile fashion.  A  bivalve speculum is placed in the vagina and the anterior cervical lip is  infiltrated with 1% Xylocaine and grasped with a single-tooth tenaculum.  A  paracervical block is placed at the 2, 4, 8, and 10 o'clock positions with  approximately 10 mL total of 1% plain Xylocaine.  While beginning to dilate  the cervix, the patient apparently underwent a laryngospasm.  Instruments  were withdrawn.  The patient was converted to endotracheal intubation per  Dr. Tacy Dura.  The patient was then stable and the procedure continued.  The  cervix was gently progressively dilated to a 27 Pratt dilator.  The  diagnostic hysteroscope was placed in the endocervical canal and advanced  under direct visualization using distending media.  The cervix is acutely  anteflexed at the internal os.  In order to enter the endometrial cavity,  the hysteroscope is placed in the endocervical canal and the bivalve  speculum is removed to allow adequate movement of the scope.  However, the  endometrial canal is entered under direct visualization without difficulty  and the above findings are noted.  Due to the acute anteflexion of the  uterus, sharp curettage is then carried out initially in attempt to remove  the above structure.  Reinspection with the diagnostic hysteroscope reveals  the structure to be consistent with a submucous leiomyoma.  The cervix is  then dilated to a 33 Pratt dilator.  Repeated attempts at entering the  endometrial cavity with the resectoscope with a single right angle wire loop  are unsuccessful.  It is felt that the acute anteflexion of the uterus makes  it difficult to pass the scope.  By this time the deficit on the distending  media is approximately 400 mL.  Some of that is on the floor.  However, at  this point the procedure is ended.  All instruments are removed, all counts  are correct, and the patient is taken to the recovery room in stable  condition.       JET/MEDQ  D:  05/09/2004  T:  05/09/2004  Job:  [id number]

## 2010-11-09 NOTE — H&P (Signed)
[name], [name]             ACCOUNT NO.:  [id number]   MEDICAL RECORD NO.:  [id number]          PATIENT TYPE:  AMB   LOCATION:  SDC                           [facility]ACILITY:  [facility]   PHYSICIAN:  Guy Sandifer. Tomblin II, M.D.DATE OF BIRTH:  1950/11/30   DATE OF ADMISSION:  05/09/2004  DATE OF DISCHARGE:                                HISTORY & PHYSICAL   CHIEF COMPLAINT:  Heavy regular bleeding.   HISTORY OF PRESENT ILLNESS:  This patient is a 60 year old divorced white  female, G3, P3, who has been having menses about every 6 months for the past  2 years.  Pelvic ultrasound on March 29, 2004 revealed the uterus measuring  8.6 x 4.0 x 4.1 cm.  Sonohystogram was consistent with a thickened area on  the anterior wall of the endometrial lining, possibly consistent with a  polyp.  After discussion with the patient, she is being admitted for  hysteroscopy with resectoscope and dilatation and curettage.  The potential  risks and complications have been discussed with the patient preoperatively.   PAST MEDICAL HISTORY:  1.  Chronic hypertension.  2.  Esophageal reflux disease.  3.  History of kidney stone.   PAST SURGICAL HISTORY:  1.  Tubal ligation.  2.  Cholecystectomy.   OBSTETRIC HISTORY:  Cesarean section x3.   MEDICATIONS:  1.  Zocor.  2.  Toprol.  3.  Diovan.  4.  Prilosec.   ALLERGIES:  No known drug allergies.   FAMILY HISTORY:  Positive for colon cancer in aunt and paternal grandmother.  Chronic hypertension in mother and father.  Coronary artery disease in  father.  Asthma in daughter.  Migraine headaches in daughter.  Thyroid  abnormality in maternal grandmother, daughter, and mother.  Heavy menses in  maternal grandmother and mother.   REVIEW OF SYSTEMS:  NEUROLOGIC:  Denies change in headache.  CARDIOVASCULAR:  Denies recent chest pain.  PULMONARY:  Denies shortness of breath.   PHYSICAL EXAMINATION:  VITAL SIGNS:  Height 5 feet, 5 inches, weight 279  pounds.   Blood pressure 122/80.  HEENT:  Without thyromegaly.  LUNGS:  Clear to auscultation.  HEART:  Regular rate and rhythm.  BACK:  Without CVA tenderness.  BREASTS:  Without palpable mass, retraction, discharge.  ABDOMEN:  Obese, soft, nontender, without palpable masses.  PELVIC:  Vulva, vagina, and cervix contains a 2 cm dark brown nevus on the  mid portion of the left labia.  Uterus is normal size, mobile, nontender.  Adnexa nontender without masses.  Exam high compromised with patient  habitus.  EXTREMITIES/NEUROLOGIC:  Grossly within normal limits.   ASSESSMENT:  Heavy perimenopausal bleeding.   PLAN:  Hysteroscopy with resectoscope, dilatation, and curettage.      JET/MEDQ  D:  05/08/2004  T:  05/08/2004  Job:  [id number]

## 2010-11-09 NOTE — H&P (Signed)
NAMEYANETTE, TRIPOLI             ACCOUNT NO.:  [id number]   MEDICAL RECORD NO.:  [id number]           PATIENT TYPE:   LOCATION:                                [facility]   PHYSICIAN:  Guy Sandifer. Tomblin II, M.D.DATE OF BIRTH:  [birth year]   DATE OF ADMISSION:  DATE OF DISCHARGE:                                HISTORY & PHYSICAL   CHIEF COMPLAINT:  Postmenopausal bleeding.   HISTORY OF PRESENT ILLNESS:  This patient is a 60 year old female, G3, P3  who has been having menses about every six months for the past two years.  Ultrasound on March 29, 2004 revealed a uterus measuring 8.6 x 4.0 x 4.1  cm.  Sonohysterogram is consistent with a thickened area on the anterior  wall of the uterus possibly consistent with a polyp.  Ovaries appeared  normal.  Hysteroscopy with resectoscope dilation and curettage is discussed  with the patient.  The potential risks and complications have been reviewed  preoperatively.   PAST MEDICAL HISTORY:  1.  Reflux.  2.  Chronic hypertension.  3.  History of gallstones.   PAST SURGICAL HISTORY:  1.  Tubal ligation.  2.  Cholecystectomy.   OBSTETRIC HISTORY:  Cesarean section x3.   MEDICATIONS:  Zocor, Toprol, Diovan, Prilosec.   ALLERGIES:  No known drug allergies.   SOCIAL HISTORY:  The patient denies tobacco, alcohol or drug abuse.   FAMILY HISTORY:  Colon cancer paternal grandmother, colon cancer aunt.  Chronic hypertension mother and father.  Coronary artery disease father,  asthma in daughter, migraine headaches daughter.  Abnormal thyroid daughter,  mother and maternal grandmother.   REVIEW OF SYMPTOMS:  NEUROLOGIC:  Denies headache.  CARDIAC:  Denies recent  chest pain.  PULMONARY:  Denies shortness of breath.  GI:  Denies recent  changes in bowel habits.   PHYSICAL EXAMINATION:  VITAL SIGNS:  Height 5 feet 5 inches, weight 279  pounds, blood pressure 122/80.  HEENT:  Without thyromegaly.  LUNGS:  Clear to auscultation.  HEART:   Regular rate and rhythm.  BACK:  Without CVA tenderness.  BREASTS:  Without mass, traction or discharge.  ABDOMEN:  Obese, soft, nontender without palpable masses.  PELVIC:  Vulva, vagina, cervix without lesions. Uterus normal size, mobile,  nontender, adnexa nontender without masses. Exam compromised by patient  habitus.  EXTREMITIES:  Grossly within normal limits.  NEUROLOGIC:  Grossly within normal limits.   ASSESSMENT:  Postmenopausal bleeding, hysteroscopy with resectoscope,  dilatation curettage.      JET/MEDQ  D:  05/01/2004  T:  05/01/2004  Job:  [id number]

## 2010-11-09 NOTE — Op Note (Signed)
[name], [name]             ACCOUNT NO.:  [id number]   MEDICAL RECORD NO.:  [id number]          PATIENT TYPE:  AMB   LOCATION:  ENDO                         FACILITY:  [facility]   PHYSICIAN:  Anselmo Rod, M.D.  DATE OF BIRTH:  [birth year]   DATE OF PROCEDURE:  12/10/2004  DATE OF DISCHARGE:                                 OPERATIVE REPORT   PROCEDURE PERFORMED:  Colonoscopy with cold biopsies times two.   ENDOSCOPIST:  Charna Elizabeth, M.D.   INSTRUMENT USED:  Olympus video colonoscope.   INDICATIONS FOR PROCEDURE:  The patient is a 60 year old white female with a  family history of colon cancer in a maternal aunt, maternal grandmother and  personal history of rectal bleeding, undergoing screening colonoscopy to  rule out colonic polyps, masses, etc.   PREPROCEDURE PREPARATION:  Informed consent was procured from the patient.  The patient was fasted for eight hours prior to the procedure and prepped  with a bottle of magnesium citrate and a gallon of GoLYTELY the night prior  to the procedure.  The risks and benefits of the procedure including a 10%  miss rate for colon polyps or cancers was discussed with the patient as  well.   PREPROCEDURE PHYSICAL:  The patient had stable vital signs.  Neck supple.  Chest clear to auscultation.  S1 and S2 regular.  Abdomen soft with normal  bowel sounds.   DESCRIPTION OF PROCEDURE:  The patient was placed in left lateral decubitus  position and sedated with 80 mg of Demerol and 8 mg of Versed in slow  incremental doses.  Once the patient was adequately sedated and maintained  on low flow oxygen and continuous cardiac monitoring, the Olympus video  colonoscope was advanced from the rectum to the cecum.  The appendicular  orifice and ileocecal valve were visualized and photographed.  There was  evidence of diverticulosis throughout the colon with more prominent changes  in the sigmoid colon and the proximal right colon.  A cecal  diverticulum was  also noted.  A small sessile polyp was biopsied times two (cold biopsies)  from the proximal transverse colon.  Small internal hemorrhoids were seen on  retroflexion.  The patient tolerated the procedure well without  complication.   IMPRESSION:  1.  Scattered diverticulosis in both right and left colon and in the cecum.  2.  Small sessile polyp biopsied from proximal transverse colon (cold      biopsies times two).  3.  Small internal hemorrhoids seen on retroflexion.   RECOMMENDATIONS:  1.  Await pathology results.  2.  Avoid all nonsteroidals including aspirin for the next two weeks.  3.  Repeat colonoscopy depending on pathology results.  4.  Outpatient followup as need arises in the future.       JNM/MEDQ  D:  12/10/2004  T:  12/10/2004  Job:  [id number]   cc:   Valetta Mole. Swords, M.D. [facility]

## 2010-11-12 ENCOUNTER — Other Ambulatory Visit: Payer: Self-pay | Admitting: Internal Medicine

## 2010-11-20 ENCOUNTER — Inpatient Hospital Stay (INDEPENDENT_AMBULATORY_CARE_PROVIDER_SITE_OTHER)
Admission: RE | Admit: 2010-11-20 | Discharge: 2010-11-20 | Disposition: A | Discharge status: Home / Self Care | Payer: 59 | Source: Ambulatory Visit | Attending: Family Medicine | Admitting: Family Medicine

## 2010-11-20 DIAGNOSIS — J019 Acute sinusitis, unspecified: Secondary | ICD-10-CM

## 2010-11-20 DIAGNOSIS — I10 Essential (primary) hypertension: Secondary | ICD-10-CM

## 2010-12-06 ENCOUNTER — Encounter (INDEPENDENT_AMBULATORY_CARE_PROVIDER_SITE_OTHER): Payer: Self-pay | Admitting: General Surgery

## 2010-12-06 ENCOUNTER — Other Ambulatory Visit (INDEPENDENT_AMBULATORY_CARE_PROVIDER_SITE_OTHER): Payer: Self-pay | Admitting: General Surgery

## 2010-12-06 DIAGNOSIS — R1900 Intra-abdominal and pelvic swelling, mass and lump, unspecified site: Secondary | ICD-10-CM

## 2010-12-10 ENCOUNTER — Ambulatory Visit
Admission: RE | Admit: 2010-12-10 | Discharge: 2010-12-10 | Disposition: A | Discharge status: Home / Self Care | Payer: 59 | Source: Ambulatory Visit | Attending: General Surgery | Admitting: General Surgery

## 2010-12-10 DIAGNOSIS — R1900 Intra-abdominal and pelvic swelling, mass and lump, unspecified site: Secondary | ICD-10-CM

## 2010-12-10 MED ORDER — IOHEXOL 300 MG/ML  SOLN
125.0000 mL | Freq: Once | INTRAMUSCULAR | Status: AC | PRN
  Administered 2010-12-10: 125 mL via INTRAVENOUS

## 2011-01-10 ENCOUNTER — Encounter (INDEPENDENT_AMBULATORY_CARE_PROVIDER_SITE_OTHER): Payer: 59 | Admitting: General Surgery

## 2011-01-17 ENCOUNTER — Encounter (INDEPENDENT_AMBULATORY_CARE_PROVIDER_SITE_OTHER): Payer: Self-pay | Admitting: General Surgery

## 2011-01-17 ENCOUNTER — Ambulatory Visit (INDEPENDENT_AMBULATORY_CARE_PROVIDER_SITE_OTHER): Payer: Commercial Managed Care - PPO | Admitting: General Surgery

## 2011-01-17 VITALS — BP 144/90 | HR 78 | Temp 97.0°F

## 2011-01-17 DIAGNOSIS — K436 Other and unspecified ventral hernia with obstruction, without gangrene: Secondary | ICD-10-CM

## 2011-01-17 DIAGNOSIS — K430 Incisional hernia with obstruction, without gangrene: Secondary | ICD-10-CM

## 2011-01-17 NOTE — Patient Instructions (Signed)
The CT scan shows a hernia just above the umbilicus. There is only fatty tissue in this. It is relatively small. It is advisable to have this repaired at some point in the future. It was your desire to go ahead and schedule this at this time, and so we will set this up to be done laparoscopically either [place] along or [place].

## 2011-01-17 NOTE — Progress Notes (Signed)
Subjective:     [name], female   DOB: 09/28/1950, 60 y.o.   MRN: [id number]  HPI The CT scan shows a 2.6 cm hernia around the umbilicus containing fat only. Slight fatty infiltration of the liver was noted. Otherwise negative CT.  I have talked this over with the patient. She is having pain only when something presses against the hernia. She is advised that repair this is advisable but is selected. It was her decision to go ahead and have this repaired at this time before he gets any better. I feel that this is appropriate.  Past Medical History  Diagnosis Date  . GERD (gastroesophageal reflux disease)   . Anxiety   . Hyperlipidemia   . Hypertension   . Hx of colonic polyps   . Kidney stones     Current Outpatient Prescriptions  Medication Sig Dispense Refill  . aspirin 81 MG tablet Take 81 mg by mouth daily.        . citalopram (CELEXA) 20 MG tablet Take 20 mg by mouth daily.        . diclofenac (VOLTAREN) 75 MG EC tablet TAKE 1 TABLET BY MOUTH TWICE DAILY  180 tablet  1  . esomeprazole (NEXIUM) 40 MG capsule Take 40 mg by mouth daily before breakfast.        . hydrALAZINE (APRESOLINE) 25 MG tablet Take 25 mg by mouth 2 (two) times daily.        Marland Kitchen losartan-hydrochlorothiazide (HYZAAR) 100-25 MG per tablet Take 1 tablet by mouth daily.        . metoprolol (LOPRESSOR) 50 MG tablet Take 50 mg by mouth 2 (two) times daily.        . simvastatin (ZOCOR) 40 MG tablet Take 40 mg by mouth at bedtime.        . ALPRAZolam (XANAX) 0.25 MG tablet Take 0.25 mg by mouth. One tab po every other day prn for anxiety       . fluticasone (VERAMYST) 27.5 MCG/SPRAY nasal spray 2 sprays by Nasal route daily.        . Multiple Vitamin (MULTIVITAMIN) tablet Take 1 tablet by mouth daily.          No Known Allergies  Family History  Problem Relation Age of Onset  . Thyroid disease Mother   . Hyperlipidemia Mother   . Heart attack Father 29  . Hypertension Father   . Arthritis  Father     RA  . Coronary artery disease Father   . Coronary artery disease Brother   . Cancer Maternal Aunt     colon  . Cancer Maternal Grandmother     colon    History  Substance Use Topics  . Smoking status: Never Smoker   . Smokeless tobacco: Not on file  . Alcohol Use: No      Review of Systems 10 system review of systems is negative except as described above.   Objective:   Physical Exam     Assessment:     incarcerated periumbilical ventral incisional hernia.  Status post laparoscopic cholecystectomy.  Status post cesarean section x3.  Hypertension    Plan:     Scheduled for laparoscopic repair of incarcerated ventral incisional hernia with mesh.  I have discussed indications and details of surgery with her. Risks and complications have been outlined, including but not limited to bleeding, infection, conversion to open laparotomy, recurrence of the hernia,injury to adjacent organs such as intestine with major reconstructive  surgery, and other unforeseen problems. She issues well. At this time all of her questions are answered. She is in full agreement with this plan.

## 2011-02-05 ENCOUNTER — Encounter (HOSPITAL_COMMUNITY)
Admission: RE | Admit: 2011-02-05 | Discharge: 2011-02-05 | Disposition: A | Discharge status: Home / Self Care | Payer: 59 | Source: Ambulatory Visit | Attending: General Surgery | Admitting: General Surgery

## 2011-02-05 LAB — DIFFERENTIAL
Basophils Relative: 0 % (ref 0–1)
Lymphs Abs: 1.9 10*3/uL (ref 0.7–4.0)
Monocytes Absolute: 0.5 10*3/uL (ref 0.1–1.0)
Monocytes Relative: 6 % (ref 3–12)
Neutro Abs: 5.3 10*3/uL (ref 1.7–7.7)

## 2011-02-05 LAB — COMPREHENSIVE METABOLIC PANEL
BUN: 24 mg/dL — ABNORMAL HIGH (ref 6–23)
CO2: 32 mEq/L (ref 19–32)
Calcium: 10.3 mg/dL (ref 8.4–10.5)
Creatinine, Ser: 0.83 mg/dL (ref 0.50–1.10)
GFR calc Af Amer: >60 mL/min (ref >60)
GFR calc non Af Amer: >60 mL/min (ref >60)
Glucose, Bld: 105 mg/dL — ABNORMAL HIGH (ref 70–99)
Sodium: 137 mEq/L (ref 135–145)
Total Protein: 7.4 g/dL (ref 6.0–8.3)

## 2011-02-05 LAB — CBC
HCT: 43.4 % (ref 36.0–46.0)
Hemoglobin: 14.6 g/dL (ref 12.0–15.0)
MCH: 30.2 pg (ref 26.0–34.0)
MCHC: 33.6 g/dL (ref 30.0–36.0)
MCV: 89.7 fL (ref 78.0–100.0)
RBC: 4.84 MIL/uL (ref 3.87–5.11)

## 2011-02-05 LAB — SURGICAL PCR SCREEN
MRSA, PCR: NEGATIVE
Staphylococcus aureus: NEGATIVE

## 2011-02-08 ENCOUNTER — Ambulatory Visit (HOSPITAL_COMMUNITY): Admission: RE | Admit: 2011-02-08 | Payer: 59 | Source: Ambulatory Visit | Admitting: General Surgery

## 2011-02-08 HISTORY — PX: HERNIA REPAIR: SHX51

## 2011-02-12 ENCOUNTER — Ambulatory Visit (HOSPITAL_COMMUNITY)
Admission: RE | Admit: 2011-02-12 | Discharge: 2011-02-13 | Disposition: A | Discharge status: Home / Self Care | Payer: 59 | Source: Ambulatory Visit | Attending: General Surgery | Admitting: General Surgery

## 2011-02-12 DIAGNOSIS — K430 Incisional hernia with obstruction, without gangrene: Secondary | ICD-10-CM

## 2011-02-12 DIAGNOSIS — K219 Gastro-esophageal reflux disease without esophagitis: Secondary | ICD-10-CM | POA: Insufficient documentation

## 2011-02-12 DIAGNOSIS — I10 Essential (primary) hypertension: Secondary | ICD-10-CM | POA: Insufficient documentation

## 2011-02-12 DIAGNOSIS — G4733 Obstructive sleep apnea (adult) (pediatric): Secondary | ICD-10-CM | POA: Insufficient documentation

## 2011-02-12 DIAGNOSIS — M1990 Unspecified osteoarthritis, unspecified site: Secondary | ICD-10-CM | POA: Insufficient documentation

## 2011-02-12 DIAGNOSIS — Z01810 Encounter for preprocedural cardiovascular examination: Secondary | ICD-10-CM | POA: Insufficient documentation

## 2011-02-12 DIAGNOSIS — Z01812 Encounter for preprocedural laboratory examination: Secondary | ICD-10-CM | POA: Insufficient documentation

## 2011-02-12 LAB — URINALYSIS, ROUTINE W REFLEX MICROSCOPIC
Ketones, ur: NEGATIVE mg/dL
Protein, ur: NEGATIVE mg/dL
Urobilinogen, UA: 0.2 mg/dL (ref 0.0–1.0)

## 2011-02-12 LAB — URINE MICROSCOPIC-ADD ON

## 2011-02-13 ENCOUNTER — Telehealth (INDEPENDENT_AMBULATORY_CARE_PROVIDER_SITE_OTHER): Payer: Self-pay

## 2011-02-13 NOTE — Op Note (Signed)
[name], [name]             ACCOUNT NO.:  [id number]  MEDICAL RECORD NO.:  [id number]  LOCATION:  SDSC                         FACILITY:  [facility]  PHYSICIAN:  Angelia Mould. Derrell Lolling, M.D.DATE OF BIRTH:  04-21-1951  DATE OF PROCEDURE:  02/12/2011 DATE OF DISCHARGE:                              OPERATIVE REPORT   PREOPERATIVE DIAGNOSIS:  Incarcerated ventral incisional hernia.  POSTOPERATIVE DIAGNOSIS:  Incarcerated ventral incisional hernia.  OPERATION PERFORMED: 1. Laparoscopic lysis of adhesions (15-minute time). 2. Laparoscopic repair of incarcerated ventral incisional hernia with     Physio mesh.  SURGEON:  Angelia Mould. Derrell Lolling, MD  OPERATIVE INDICATIONS:  This is a 60 year old Caucasian female who has had 3 cesarean sections and a laparoscopic cholecystectomy.  She has hypertension and morbid obesity.  She presented in June 2012, with a bulge above her umbilicus.  It has been uncomfortable but she has not had any severe pain.  She wants to have this repaired before it gets much bigger.  She was evaluated in the office.  A CT scan was done which showed a small hernia above the umbilicus in the midline containing omentum and fat.  Otherwise, no abnormalities were noted.  She is brought to the operating room electively.  OPERATIVE TECHNIQUE:  Following the induction of general endotracheal anesthesia, intravenous antibiotics were given.  A Foley catheter was inserted.  The patient's abdomen was prepped and draped in a sterile fashion.  Surgical time-out was held identifying correct patient and correct procedure.  Marcaine 0.5% with epinephrine was used as a local infiltration anesthetic.  A 5-mm optical trocar was placed in the left subcostal region.  The optical entry was uneventful.  Pneumoperitoneum was created.  Video cam was inserted.  I found that she had an incisional hernia above the umbilicus in the midline.  The defect and weakness was probably about 4-5 cm in  size.  I put a 12-mm trocar in the left midabdomen and a 5-mm trocar in the left lower quadrant.  Using the Harmonic scalpel, I carefully reduced all of the omentum out of the hernia sac and debrided all of the adhesions of omentum back.  There was no bleeding from the omentum.  There were no other hernias.  In the suprapubic area, there was one adhesion of small bowel to midline but this was far away from the hernia sac and so I left that undissected.  I used a spinal needle to mark the limits of the hernia.  I chose to repair this with a Physio mesh.  I used a 15 x 20 cm piece of mesh and so I got at least 5 cm overlap in all directions and vertically I covered more than that.  Using a marking pen, I marked a template on the abdominal wall for suture fixation.  I planned 8 equidistant suture fixation sites along the mesh.  In each of these 8 suture fixation sites, I put a stitch of #1 Novafil.  The mesh was then rolled up and inserted into the abdominal cavity.  It was then positioned.  At each of the suture fixation sites, I made a small puncture wound, spread the tissues with a hemostat.  I drew  the sutures up through the suture fixation sites using a suture passer.  I tried to take at least a 1 cm bite of tissue.  After all 8 sutures were brought through the abdominal wall, I lifted them up and we seemed to have good coverage and no redundancy or folding of the mesh.  I reduced the pneumoperitoneum to 5 mmHg, then tied all of the sutures.  I then reestablished the pneumoperitoneum at 15 mmHg.  I used a Secure Strap device to further fix the mesh to the undersurface of the abdominal wall.  I did this in a double crown technique.  The outer rim was near the edge of the mesh and great care was taken to take the fixation device no more than 1 cm apart.  An inner crown was used.  I used 75 firings of the Secure Strap to fix the mesh.  The mesh was inspected.  There did not appear to  be any gaps or defects in the closure.  There was no bleeding.  The intestine and omentum looked good.  The pneumoperitoneum was released. The trocars were removed.  All the skin incisions were closed with subcuticular sutures of 4-0 Monocryl and Dermabond.  A Velcro binder was placed.  The patient tolerated the procedure well and was taken to recovery room in stable condition.  Estimated blood loss was about 20 mL or less.  Complications none.  Sponge, needle and instrument counts were correct.     Angelia Mould. Derrell Lolling, M.D.     HMI/MEDQ  D:  02/12/2011  T:  02/12/2011  Job:  [id number]  cc:   Guy Sandifer. Henderson Cloud, M.D. Bruce Rexene Edison Swords, MD  Electronically Signed by Claud Kelp M.D. on 02/13/2011 06:22:43 AM

## 2011-02-13 NOTE — Telephone Encounter (Signed)
Unable to reach pt at po call.LMOM with f/u ov and to call to confirm.

## 2011-02-27 ENCOUNTER — Encounter (INDEPENDENT_AMBULATORY_CARE_PROVIDER_SITE_OTHER): Payer: Self-pay | Admitting: General Surgery

## 2011-03-07 ENCOUNTER — Telehealth (INDEPENDENT_AMBULATORY_CARE_PROVIDER_SITE_OTHER): Payer: Self-pay

## 2011-03-07 NOTE — Telephone Encounter (Signed)
Pt called and left msg [id number] f/u appt. I called pt. Pt advised per Dr Derrell Lolling that 04-01-11 ov will be ok as long as pt doing well. Pt states she is doing fine and ok with this appt date.

## 2011-03-21 LAB — BASIC METABOLIC PANEL
BUN: 16
Chloride: 99
GFR calc non Af Amer: >60
Glucose, Bld: 92
Potassium: 4.8
Sodium: 139

## 2011-03-21 LAB — HEMOGLOBIN: Hemoglobin: 15

## 2011-03-29 ENCOUNTER — Encounter (INDEPENDENT_AMBULATORY_CARE_PROVIDER_SITE_OTHER): Payer: Self-pay | Admitting: General Surgery

## 2011-04-01 ENCOUNTER — Encounter (INDEPENDENT_AMBULATORY_CARE_PROVIDER_SITE_OTHER): Payer: Self-pay | Admitting: General Surgery

## 2011-04-01 ENCOUNTER — Ambulatory Visit (INDEPENDENT_AMBULATORY_CARE_PROVIDER_SITE_OTHER): Payer: 59 | Admitting: General Surgery

## 2011-04-01 VITALS — BP 142/94 | HR 72 | Temp 97.6°F | Resp 20 | Ht 66.0 in | Wt 281.2 lb

## 2011-04-01 DIAGNOSIS — Z9889 Other specified postprocedural states: Secondary | ICD-10-CM

## 2011-04-01 NOTE — Patient Instructions (Signed)
Your wounds have healed very well. The hernia repair appears solid. You may resume all normal physical activities without restriction. Return to see me if there are any further problems.

## 2011-04-01 NOTE — Progress Notes (Signed)
Subjective:     [name], female   DOB: December 08, 1950, 60 y.o.   MRN: [id number]  HPI This patient underwent laparoscopic repair of a ventral hernia with mesh on February 12, 2011. We used a large 15 x 20 cm piece of mesh because of the size of the hernia and her morbid obesity. She returned to work 2 weeks postop. She is having no pain now. She has no complaints about her wound. Review of Systems     Objective:   Physical Exam Patient looks well. She is in no distress.  Abdomen is  obese. All the trocar sites are well healed. The hernia repair appears solid. There is no weakness. There does not appear to be any seroma. She is nontender.    Assessment:     Incarcerated ventral incisional hernia, recovering uneventfully following laparoscopic repair with mesh.  Morbid obesity.  Hypertension  Hyperlipidemia  Status post cesarean section x3.  Status post laparoscopic cholecystectomy.    Plan:     Okay to resume all normal physical activities without restriction.  Return to see me p.r.n.

## 2011-04-11 ENCOUNTER — Other Ambulatory Visit: Payer: Self-pay | Admitting: Internal Medicine

## 2011-04-19 ENCOUNTER — Encounter: Payer: Self-pay | Admitting: Internal Medicine

## 2011-04-19 ENCOUNTER — Ambulatory Visit (INDEPENDENT_AMBULATORY_CARE_PROVIDER_SITE_OTHER): Payer: 59 | Admitting: Internal Medicine

## 2011-04-19 DIAGNOSIS — N200 Calculus of kidney: Secondary | ICD-10-CM

## 2011-04-19 DIAGNOSIS — E785 Hyperlipidemia, unspecified: Secondary | ICD-10-CM

## 2011-04-19 DIAGNOSIS — I10 Essential (primary) hypertension: Secondary | ICD-10-CM

## 2011-04-19 DIAGNOSIS — Z23 Encounter for immunization: Secondary | ICD-10-CM

## 2011-04-19 NOTE — Assessment & Plan Note (Signed)
lfts recently checked Will not check lipids at this time

## 2011-04-19 NOTE — Assessment & Plan Note (Signed)
Adequate control---continue meds  BP Readings from Last 3 Encounters:  04/19/11 126/90  04/01/11 142/94  01/17/11 144/90

## 2011-04-19 NOTE — Assessment & Plan Note (Signed)
No recurrence. 

## 2011-04-19 NOTE — Progress Notes (Signed)
  Subjective:    [name], female    DOB: September 03, 1950, 60 y.o.   MRN: [id number]  HPI  htn---tlerating meds, no home BPS  GERD---tolerating meds without difficulty  Kidney stones--no recurrence  Lipids---tolerating meds  Past Medical History  Diagnosis Date  . GERD (gastroesophageal reflux disease)   . Anxiety   . Hyperlipidemia   . Hypertension   . Hx of colonic polyps   . Kidney stones   . Ventral hernia    Past Surgical History  Procedure Date  . Cesarean section   . Cholecystectomy   . Meniscus repair 2009  . Total knee arthroplasty 2011    left  . Knee arthroscopy dec 2011    right  . Mouth surgery     teeth implants  . Hernia repair 02/08/11    ventral hernia    reports that she has never smoked. She does not have any smokeless tobacco history on file. She reports that she does not drink alcohol or use illicit drugs. family history includes Arthritis in her father; Cancer in her maternal aunt and maternal grandmother; Coronary artery disease in her brother and father; Heart attack (age of onset:69) in her father; Hyperlipidemia in her mother; Hypertension in her father; and Thyroid disease in her mother. No Known Allergies   Review of Systems  patient denies chest pain, shortness of breath, orthopnea. Denies lower extremity edema, abdominal pain, change in appetite, change in bowel movements. Patient denies rashes, musculoskeletal complaints. No other specific complaints in a complete review of systems.      Objective:   Physical Exam  Well-developed well-nourished female in no acute distress. HEENT exam atraumatic, normocephalic, extraocular muscles are intact. Neck is supple. No jugular venous distention no thyromegaly. Chest clear to auscultation without increased work of breathing. Cardiac exam S1 and S2 are regular. Abdominal exam active bowel sounds, soft, nontender. Extremities no edema. Neurologic exam she is alert without any motor  sensory deficits. Gait is normal.        Assessment & Plan:

## 2011-04-25 ENCOUNTER — Other Ambulatory Visit: Payer: Self-pay | Admitting: *Deleted

## 2011-04-25 MED ORDER — ESOMEPRAZOLE MAGNESIUM 40 MG PO CPDR: 40.0000 mg | DELAYED_RELEASE_CAPSULE | Freq: Every day | ORAL | Status: DC

## 2011-04-25 MED ORDER — METOPROLOL TARTRATE 50 MG PO TABS: 50.0000 mg | ORAL_TABLET | Freq: Two times a day (BID) | ORAL | Status: DC

## 2011-04-25 MED ORDER — DICLOFENAC SODIUM 75 MG PO TBEC: 75.0000 mg | DELAYED_RELEASE_TABLET | Freq: Two times a day (BID) | ORAL | Status: DC

## 2011-04-25 MED ORDER — LOSARTAN POTASSIUM-HCTZ 100-25 MG PO TABS: 1.0000 | ORAL_TABLET | Freq: Every day | ORAL | Status: DC

## 2011-04-25 MED ORDER — SIMVASTATIN 40 MG PO TABS: 40.0000 mg | ORAL_TABLET | Freq: Every day | ORAL | Status: DC

## 2011-04-25 MED ORDER — CITALOPRAM HYDROBROMIDE 20 MG PO TABS: 20.0000 mg | ORAL_TABLET | Freq: Every day | ORAL | Status: DC

## 2011-07-15 ENCOUNTER — Ambulatory Visit (INDEPENDENT_AMBULATORY_CARE_PROVIDER_SITE_OTHER): Payer: 59 | Admitting: Family

## 2011-07-15 ENCOUNTER — Encounter: Payer: Self-pay | Admitting: Family

## 2011-07-15 VITALS — BP 130/90 | Temp 98.7°F | Wt 294.0 lb

## 2011-07-15 DIAGNOSIS — H1045 Other chronic allergic conjunctivitis: Secondary | ICD-10-CM

## 2011-07-15 DIAGNOSIS — J019 Acute sinusitis, unspecified: Secondary | ICD-10-CM

## 2011-07-15 DIAGNOSIS — H6980 Other specified disorders of Eustachian tube, unspecified ear: Secondary | ICD-10-CM

## 2011-07-15 DIAGNOSIS — H1010 Acute atopic conjunctivitis, unspecified eye: Secondary | ICD-10-CM

## 2011-07-15 MED ORDER — AZELASTINE HCL 0.05 % OP SOLN: 1.0000 [drp] | Freq: Two times a day (BID) | OPHTHALMIC | Status: DC

## 2011-07-15 MED ORDER — AMOXICILLIN-POT CLAVULANATE 875-125 MG PO TABS: 1.0000 | ORAL_TABLET | Freq: Two times a day (BID) | ORAL | Status: DC

## 2011-07-15 NOTE — Progress Notes (Signed)
Subjective:    [name], female    DOB: 12/07/1950, 61 y.o.   MRN: [id number]  HPI  61 year old white female, nonsmoker, patient of Dr. Cato Mulligan in today with complaints of sinus pressure and pain, pain in her teeth ears hurting, sore throat, facial pain, and itching in her left eye that's been going on for 4 days, and worsening. She hasn't taken Zyrtec with no relief. She recently had knee replacement surgery. She denies any lightheadedness, dizziness, chest pain, shortness of breath or palpitations.  Review of Systems  Constitutional: Positive for fatigue.  HENT: Positive for ear pain, congestion, sore throat, rhinorrhea and sinus pressure.   Eyes: Positive for itching.       Left eye itching and mildly red.  Respiratory: Positive for cough.   Cardiovascular: Negative.   Gastrointestinal: Negative.   Genitourinary: Negative.   Musculoskeletal: Negative.   Skin: Negative.   Neurological: Negative.   Hematological: Negative.        Past Medical History  Diagnosis Date  . GERD (gastroesophageal reflux disease)   . Anxiety   . Hyperlipidemia   . Hypertension   . Hx of colonic polyps   . Kidney stones   . Ventral hernia     History   Social History  . Marital Status: Divorced    Spouse Name: N/A    Number of Children: N/A  . Years of Education: N/A   Occupational History  . Not on file.   Social History Main Topics  . Smoking status: Never Smoker   . Smokeless tobacco: Not on file  . Alcohol Use: No  . Drug Use: No  . Sexually Active:    Other Topics Concern  . Not on file   Social History Narrative  . No narrative on file    Past Surgical History  Procedure Date  . Cesarean section   . Cholecystectomy   . Meniscus repair 2009  . Total knee arthroplasty 2011    left  . Knee arthroscopy dec 2011    right  . Mouth surgery     teeth implants  . Hernia repair 02/08/11    ventral hernia    Family History  Problem Relation Age of  Onset  . Thyroid disease Mother   . Hyperlipidemia Mother   . Heart attack Father 58  . Hypertension Father   . Arthritis Father     RA  . Coronary artery disease Father   . Coronary artery disease Brother   . Cancer Maternal Aunt     colon  . Cancer Maternal Grandmother     colon    No Known Allergies  Current Outpatient Prescriptions on File Prior to Visit  Medication Sig Dispense Refill  . aspirin 81 MG tablet Take 81 mg by mouth daily.        . cetirizine (ZYRTEC) 10 MG tablet Take 10 mg by mouth daily.        . citalopram (CELEXA) 20 MG tablet Take 1 tablet (20 mg total) by mouth daily.  90 tablet  3  . diclofenac (VOLTAREN) 75 MG EC tablet Take 1 tablet (75 mg total) by mouth 2 (two) times daily.  180 tablet  3  . esomeprazole (NEXIUM) 40 MG capsule Take 1 capsule (40 mg total) by mouth daily before breakfast.  90 capsule  3  . losartan-hydrochlorothiazide (HYZAAR) 100-25 MG per tablet Take 1 tablet by mouth daily.  90 tablet  3  . metoprolol (LOPRESSOR) 50  MG tablet Take 1 tablet (50 mg total) by mouth 2 (two) times daily.  180 tablet  3  . simvastatin (ZOCOR) 40 MG tablet Take 1 tablet (40 mg total) by mouth at bedtime.  90 tablet  3    BP 130/90  Temp(Src) 98.7 F (37.1 C) (Oral)  Wt 294 lb (133.358 kg)chart Objective:   Physical Exam  Constitutional: She is oriented to person, place, and time. She appears well-developed and well-nourished.  HENT:  Right Ear: External ear normal.  Left Ear: External ear normal.  Nose: Nose normal.  Mouth/Throat: Oropharynx is clear and moist.       Small amount of fluid in the ears bilaterally. Maxillary sinus tenderness to palpation bilaterally.  Neck: Normal range of motion.  Cardiovascular: Normal rate, regular rhythm and normal heart sounds.   Pulmonary/Chest: Effort normal.  Musculoskeletal: Normal range of motion.  Neurological: She is alert and oriented to person, place, and time.  Skin: Skin is warm and dry.           Assessment & Plan:  Assessment: Acute sinusitis, eustachian tube dysfunction, allergic conjunctivitis of the left eye  Plan: Augmentin 875 one by mouth twice a day x10 days. Zyrtec over-the-counter. Optivar one drop to the affected eye daily. Rest. Drink plenty of fluids. Call the office if symptoms worsen or persist. Recheck as scheduled, and when necessary.

## 2011-07-15 NOTE — Patient Instructions (Signed)
Allergic Conjunctivitis The conjunctiva is a thin membrane that covers the visible white part of the eyeball and the underside of the eyelids. This membrane protects and lubricates the eye. The membrane has small blood vessels running through it that can normally be seen. When the conjunctiva becomes inflamed, the condition is called conjunctivitis. In response to the inflammation, the conjunctival blood vessels become swollen. The swelling results in redness in the normally white part of the eye. The blood vessels of this membrane also react when a person has allergies and is then called allergic conjunctivitis. This condition usually lasts for as long as the allergy persists. Allergic conjunctivitis cannot be passed to another person (non-contagious). The likelihood of bacterial infection is great and the cause is not likely due to allergies if the inflamed eye has:  A sticky discharge.   Discharge or sticking together of the lids in the morning.   Scaling or flaking of the eyelids where the eyelashes come out.   Red swollen eyelids.  CAUSES   Viruses.   Irritants such as foreign bodies.   Chemicals.   General allergic reactions.   Inflammation or serious diseases in the inside or the outside of the eye or the orbit (the boney cavity in which the eye sits) can cause a "red eye."  SYMPTOMS   Eye redness.   Tearing.   Itchy eyes.   Burning feeling in the eyes.   Clear drainage from the eye.   Allergic reaction due to pollens or ragweed sensitivity. Seasonal allergic conjunctivitis is frequent in the spring when pollens are in the air and in the fall.  DIAGNOSIS  This condition, in its many forms, is usually diagnosed based on the history and an ophthalmological exam. It usually involves both eyes. If your eyes react at the same time every year, allergies may be the cause. While most "red eyes" are due to allergy or an infection, the role of an eye (ophthalmological) exam is  important. The exam can rule out serious diseases of the eye or orbit. TREATMENT   Non-antibiotic eye drops, ointments, or medications by mouth may be prescribed if the ophthalmologist is sure the conjunctivitis is due to allergies alone.   Over-the-counter drops and ointments for allergic symptoms should be used only after other causes of conjunctivitis have been ruled out, or as your caregiver suggests.  Medications by mouth are often prescribed if other allergy-related symptoms are present. If the ophthalmologist is sure that the conjunctivitis is due to allergies alone, treatment is normally limited to drops or ointments to reduce itching and burning. HOME CARE INSTRUCTIONS   Wash hands before and after applying drops or ointments, or touching the inflamed eye(s) or eyelids.   Do not let the eye dropper tip or ointment tube touch the eyelid when putting medicine in your eye.   Stop using your soft contact lenses and throw them away. Use a new pair of lenses when recovery is complete. You should run through sterilizing cycles at least three times before use after complete recovery if the old soft contact lenses are to be used. Hard contact lenses should be stopped. They need to be thoroughly sterilized before use after recovery.   Itching and burning eyes due to allergies is often relieved by using a cool cloth applied to closed eye(s).  SEEK MEDICAL CARE IF:   Your problems do not go away after two or three days of treatment.   Your lids are sticky (especially in the morning when   you wake up) or stick together.   Discharge develops. Antibiotics may be needed either as drops, ointment, or by mouth.   You have extreme light sensitivity.   An oral temperature above 102 F (38.9 C) develops.   Pain in or around the eye or any other visual symptom develops.  MAKE SURE YOU:   Understand these instructions.   Will watch your condition.   Will get help right away if you are not doing  well or get worse.  Document Released: 08/31/2002 Document Revised: 02/20/2011 Document Reviewed: 07/27/2007 [facility] Patient Information 2012 [place], [place]. Sinusitis Sinuses are air pockets within the bones of your face. The growth of bacteria within a sinus leads to infection. The infection prevents the sinuses from draining. This infection is called sinusitis. SYMPTOMS  There will be different areas of pain depending on which sinuses have become infected.  The maxillary sinuses often produce pain beneath the eyes.   Frontal sinusitis may cause pain in the middle of the forehead and above the eyes.  Other problems (symptoms) include:  Toothaches.   Colored, pus-like (purulent) drainage from the nose.   Swelling, warmth, and tenderness over the sinus areas may be signs of infection.  TREATMENT  Sinusitis is most often determined by an exam.X-rays may be taken. If x-rays have been taken, make sure you obtain your results or find out how you are to obtain them. Your caregiver may give you medications (antibiotics). These are medications that will help kill the bacteria causing the infection. You may also be given a medication (decongestant) that helps to reduce sinus swelling.  HOME CARE INSTRUCTIONS   Only take over-the-counter or prescription medicines for pain, discomfort, or fever as directed by your caregiver.   Drink extra fluids. Fluids help thin the mucus so your sinuses can drain more easily.   Applying either moist heat or ice packs to the sinus areas may help relieve discomfort.   Use saline nasal sprays to help moisten your sinuses. The sprays can be found at your local drugstore.  SEEK IMMEDIATE MEDICAL CARE IF:  You have a fever.   You have increasing pain, severe headaches, or toothache.   You have nausea, vomiting, or drowsiness.   You develop unusual swelling around the face or trouble seeing.  MAKE SURE YOU:   Understand these instructions.   Will watch  your condition.   Will get help right away if you are not doing well or get worse.  Document Released: 06/10/2005 Document Revised: 02/20/2011 Document Reviewed: 01/07/2007 [facility] Patient Information 2012 [place], [place].

## 2011-07-29 ENCOUNTER — Other Ambulatory Visit: Payer: Self-pay | Admitting: Internal Medicine

## 2011-11-14 ENCOUNTER — Ambulatory Visit (INDEPENDENT_AMBULATORY_CARE_PROVIDER_SITE_OTHER): Payer: 59 | Admitting: Internal Medicine

## 2011-11-14 ENCOUNTER — Encounter: Payer: Self-pay | Admitting: Internal Medicine

## 2011-11-14 VITALS — BP 146/92 | Wt 303.0 lb

## 2011-11-14 DIAGNOSIS — I10 Essential (primary) hypertension: Secondary | ICD-10-CM

## 2011-11-14 DIAGNOSIS — J069 Acute upper respiratory infection, unspecified: Secondary | ICD-10-CM

## 2011-11-14 MED ORDER — DOXYCYCLINE HYCLATE 100 MG PO TABS: 100.0000 mg | ORAL_TABLET | Freq: Two times a day (BID) | ORAL | Status: AC

## 2011-11-14 NOTE — Patient Instructions (Signed)
Acute sinusitis symptoms for less than 10 days are generally not helped by antibiotic therapy.  Use saline irrigation, warm  moist compresses and over-the-counter decongestants only as directed.  Call if there is no improvement in 5 to 7 days, or sooner if you develop increasing pain, fever, or any new symptoms.  NASONEX  2 puffs each side once daily

## 2011-11-14 NOTE — Progress Notes (Signed)
  Subjective:    [name], female    DOB: 1951-03-08, 61 y.o.   MRN: [id number]  HPI  61 year old patient who has a history of treated hypertension who presents with a five-day history of sinus congestion facial pain including dental pain. She describes some nasal congestion as well as some rhinorrhea she has mild nonproductive cough no fever. She was seen 4 months ago and treated for acute sinusitis with Augmentin.   Review of Systems  Constitutional: Positive for fatigue.  HENT: Positive for congestion, rhinorrhea and sinus pressure. Negative for hearing loss, sore throat, dental problem and tinnitus.   Eyes: Negative for pain, discharge and visual disturbance.  Respiratory: Positive for cough. Negative for shortness of breath.   Cardiovascular: Negative for chest pain, palpitations and leg swelling.  Gastrointestinal: Negative for nausea, vomiting, abdominal pain, diarrhea, constipation, blood in stool and abdominal distention.  Genitourinary: Negative for dysuria, urgency, frequency, hematuria, flank pain, vaginal bleeding, vaginal discharge, difficulty urinating, vaginal pain and pelvic pain.  Musculoskeletal: Negative for joint swelling, arthralgias and gait problem.  Skin: Negative for rash.  Neurological: Negative for dizziness, syncope, speech difficulty, weakness, numbness and headaches.  Hematological: Negative for adenopathy.  Psychiatric/Behavioral: Negative for behavioral problems, dysphoric mood and agitation. The patient is not nervous/anxious.        Objective:   Physical Exam  Constitutional: She is oriented to person, place, and time. She appears well-developed and well-nourished.  HENT:  Head: Normocephalic.  Right Ear: External ear normal.  Left Ear: External ear normal.  Mouth/Throat: Oropharynx is clear and moist.       pansinus tenderness  Eyes: Conjunctivae and EOM are normal. Pupils are equal, round, and reactive to light.  Neck: Normal  range of motion. Neck supple. No thyromegaly present.  Cardiovascular: Normal rate, regular rhythm, normal heart sounds and intact distal pulses.   Pulmonary/Chest: Effort normal and breath sounds normal.  Abdominal: Soft. Bowel sounds are normal. She exhibits no mass. There is no tenderness.  Musculoskeletal: Normal range of motion.  Lymphadenopathy:    She has no cervical adenopathy.  Neurological: She is alert and oriented to person, place, and time.  Skin: Skin is warm and dry. No rash noted.  Psychiatric: She has a normal mood and affect. Her behavior is normal.          Assessment & Plan:   URI with sinus congestion and facial pain.  The patient was made aware that bacterial  infection unlikely with short duration of her illness. Will treat symptomatically and observe. With the long Memorial Day weekend upcoming we'll give a prescription for an antibiotic to take his symptoms worsen

## 2012-01-05 ENCOUNTER — Encounter (HOSPITAL_BASED_OUTPATIENT_CLINIC_OR_DEPARTMENT_OTHER): Payer: Self-pay | Admitting: Emergency Medicine

## 2012-01-05 ENCOUNTER — Emergency Department (HOSPITAL_BASED_OUTPATIENT_CLINIC_OR_DEPARTMENT_OTHER)
Admission: EM | Admit: 2012-01-05 | Discharge: 2012-01-05 | Disposition: A | Discharge status: Home / Self Care | Payer: 59 | Attending: Emergency Medicine | Admitting: Emergency Medicine

## 2012-01-05 ENCOUNTER — Emergency Department (HOSPITAL_BASED_OUTPATIENT_CLINIC_OR_DEPARTMENT_OTHER): Payer: 59

## 2012-01-05 DIAGNOSIS — I10 Essential (primary) hypertension: Secondary | ICD-10-CM | POA: Insufficient documentation

## 2012-01-05 DIAGNOSIS — E785 Hyperlipidemia, unspecified: Secondary | ICD-10-CM | POA: Insufficient documentation

## 2012-01-05 DIAGNOSIS — K219 Gastro-esophageal reflux disease without esophagitis: Secondary | ICD-10-CM | POA: Insufficient documentation

## 2012-01-05 DIAGNOSIS — M546 Pain in thoracic spine: Secondary | ICD-10-CM | POA: Insufficient documentation

## 2012-01-05 MED ORDER — NAPROXEN 500 MG PO TABS: 500.0000 mg | ORAL_TABLET | Freq: Two times a day (BID) | ORAL | Status: DC

## 2012-01-05 MED ORDER — KETOROLAC TROMETHAMINE 60 MG/2ML IM SOLN
60.0000 mg | Freq: Once | INTRAMUSCULAR | Status: AC
  Administered 2012-01-05: 60 mg via INTRAMUSCULAR
  Filled 2012-01-05: qty 2

## 2012-01-05 MED ORDER — HYDROCODONE-ACETAMINOPHEN 5-325 MG PO TABS
2.0000 | ORAL_TABLET | Freq: Once | ORAL | Status: AC
  Administered 2012-01-05: 2 via ORAL
  Filled 2012-01-05: qty 2

## 2012-01-05 MED ORDER — HYDROCODONE-ACETAMINOPHEN 5-500 MG PO TABS: 1.0000 | ORAL_TABLET | Freq: Four times a day (QID) | ORAL | Status: DC | PRN

## 2012-01-05 NOTE — ED Notes (Signed)
The patient is undressed and in a gown. Family is at the bedside and a the call light is within reach.

## 2012-01-05 NOTE — ED Notes (Addendum)
Pt c/o LT upper back pain since Fri pm; no known injury; describes as "pounding"; has been with grandchildren more than usual lately

## 2012-01-05 NOTE — ED Provider Notes (Signed)
History     CSN: [id number]  Arrival date & time 01/05/12  0944   First MD Initiated Contact with Patient 01/05/12 1027      Chief Complaint  Patient presents with  . Back Pain    (Consider location/radiation/quality/duration/timing/severity/associated sxs/prior treatment) HPI Comments: No known injury but she does report her grandkids have been at the house visiting and she has been playing with them.   No radiation or bowel or bladder complaints.    Patient is a 61 y.o. female presenting with back pain. The history is provided by the patient.  Back Pain  This is a new problem. The current episode started 2 days ago. The problem occurs constantly. The problem has been gradually worsening. The pain is associated with no known injury. The pain is present in the thoracic spine. The quality of the pain is described as stabbing. The pain does not radiate. The pain is moderate. The symptoms are aggravated by bending, twisting and certain positions. The pain is the same all the time.    Past Medical History  Diagnosis Date  . GERD (gastroesophageal reflux disease)   . Anxiety   . Hyperlipidemia   . Hypertension   . Hx of colonic polyps   . Kidney stones   . Ventral hernia     Past Surgical History  Procedure Date  . Cesarean section   . Cholecystectomy   . Meniscus repair 2009  . Total knee arthroplasty 2011    left  . Knee arthroscopy dec 2011    right  . Mouth surgery     teeth implants  . Hernia repair 02/08/11    ventral hernia    Family History  Problem Relation Age of Onset  . Thyroid disease Mother   . Hyperlipidemia Mother   . Heart attack Father 49  . Hypertension Father   . Arthritis Father     RA  . Coronary artery disease Father   . Coronary artery disease Brother   . Cancer Maternal Aunt     colon  . Cancer Maternal Grandmother     colon    History  Substance Use Topics  . Smoking status: Never Smoker   . Smokeless tobacco: Not on file  .  Alcohol Use: No    OB History    Grav Para Term Preterm Abortions TAB SAB Ect Mult Living                  Review of Systems  Musculoskeletal: Positive for back pain.  All other systems reviewed and are negative.    Allergies  Review of patient's allergies indicates no known allergies.  Home Medications   Current Outpatient Rx  Name Route Sig Dispense Refill  . ASPIRIN 81 MG PO TABS Oral Take 81 mg by mouth daily.      Marland Kitchen CETIRIZINE HCL 10 MG PO TABS Oral Take 10 mg by mouth daily.      Marland Kitchen CITALOPRAM HYDROBROMIDE 20 MG PO TABS Oral Take 1 tablet (20 mg total) by mouth daily. 90 tablet 3  . DICLOFENAC SODIUM 75 MG PO TBEC Oral Take 1 tablet (75 mg total) by mouth 2 (two) times daily. 180 tablet 3  . ESOMEPRAZOLE MAGNESIUM 40 MG PO CPDR Oral Take 1 capsule (40 mg total) by mouth daily before breakfast. 90 capsule 3  . HYDRALAZINE HCL 25 MG PO TABS  TAKE 1 TABLET BY MOUTH TWICE DAILY 180 tablet 3  . LOSARTAN POTASSIUM-HCTZ 100-25 MG PO  TABS Oral Take 1 tablet by mouth daily. 90 tablet 3  . METOPROLOL TARTRATE 50 MG PO TABS Oral Take 1 tablet (50 mg total) by mouth 2 (two) times daily. 180 tablet 3  . SIMVASTATIN 40 MG PO TABS Oral Take 1 tablet (40 mg total) by mouth at bedtime. 90 tablet 3    BP 176/90  Pulse 66  Temp 98 F (36.7 C) (Oral)  Resp 16  SpO2 96%  Physical Exam  Nursing note and vitals reviewed. Constitutional: She is oriented to person, place, and time. She appears well-developed and well-nourished. No distress.  HENT:  Head: Normocephalic and atraumatic.  Neck: Normal range of motion. Neck supple.  Cardiovascular: Normal rate and regular rhythm.   No murmur heard. Pulmonary/Chest: Effort normal and breath sounds normal. No respiratory distress. She has no wheezes.  Abdominal: Soft. Bowel sounds are normal. She exhibits no distension. There is no tenderness.  Musculoskeletal:       There is ttp in the left side of the mid-thoracic area just medial to the  scapula.  The lue is neurovascularly intact.    Neurological: She is alert and oriented to person, place, and time.  Skin: Skin is warm and dry. She is not diaphoretic.    ED Course  Procedures (including critical care time)  Labs Reviewed - No data to display No results found.   No diagnosis found.    MDM  The patient presents with pain in the upper back that sounds very musculoskeletal in nature.  Her chest xray is unremarkable not showing infiltrate or ptx.  She will be treated with nsaids, lortab, to return prn if she worsens.  I considered pe, but strongly doubt.  She is not hypoxic, tachypneic, or tachycardic.          Geoffery Lyons, MD 01/05/12 [phone]

## 2012-01-13 ENCOUNTER — Encounter: Payer: Self-pay | Admitting: Family

## 2012-01-13 ENCOUNTER — Ambulatory Visit (INDEPENDENT_AMBULATORY_CARE_PROVIDER_SITE_OTHER)
Admission: RE | Admit: 2012-01-13 | Discharge: 2012-01-13 | Disposition: A | Discharge status: Home / Self Care | Payer: 59 | Source: Ambulatory Visit | Attending: Family | Admitting: Family

## 2012-01-13 ENCOUNTER — Ambulatory Visit (INDEPENDENT_AMBULATORY_CARE_PROVIDER_SITE_OTHER): Payer: 59 | Admitting: Family

## 2012-01-13 VITALS — BP 142/90 | HR 87 | Temp 98.5°F | Wt 309.0 lb

## 2012-01-13 DIAGNOSIS — M7552 Bursitis of left shoulder: Secondary | ICD-10-CM

## 2012-01-13 DIAGNOSIS — M67919 Unspecified disorder of synovium and tendon, unspecified shoulder: Secondary | ICD-10-CM

## 2012-01-13 DIAGNOSIS — M719 Bursopathy, unspecified: Secondary | ICD-10-CM

## 2012-01-13 DIAGNOSIS — T148XXA Other injury of unspecified body region, initial encounter: Secondary | ICD-10-CM

## 2012-01-13 MED ORDER — PREDNISONE 20 MG PO TABS: ORAL_TABLET | ORAL | Status: AC

## 2012-01-13 MED ORDER — KETOROLAC TROMETHAMINE 60 MG/2ML IM SOLN
60.0000 mg | Freq: Once | INTRAMUSCULAR | Status: AC
  Administered 2012-01-13: 60 mg via INTRAMUSCULAR

## 2012-01-13 MED ORDER — HYDROCODONE-ACETAMINOPHEN 5-500 MG PO TABS: 1.0000 | ORAL_TABLET | Freq: Three times a day (TID) | ORAL | Status: AC | PRN

## 2012-01-13 NOTE — Progress Notes (Signed)
Subjective:    [name], female    DOB: 11-02-1950, 61 y.o.   MRN: [id number]  HPI 61 year old white female, nonsmoker, patient of Dr. Cato Mulligan is in today with complaints of left shoulder pain, left upper back pain x8 days. She was seen at urgent care clinic had a chest x-ray done that was negative. She rates her pain a 9/10, described as constant, dull ache and throbbing at times. She's taken Vicodin and ibuprofen with relief. She was given Soridol at urgent care clinic and also help. Reports her grandchildren being in town and she was a lot more active than she typically is. She denies any chest pain, shortness of breath, lightheadedness or dizziness, nausea, vomiting or diaphoresis.   Review of Systems  Constitutional: Negative.   Respiratory: Negative.   Cardiovascular: Negative.   Gastrointestinal: Negative.   Musculoskeletal: Positive for back pain and arthralgias. Negative for joint swelling.       Left shoulder pain and pain to the mid left back.  Skin: Negative.   Neurological: Negative.   Hematological: Negative.   Psychiatric/Behavioral: Negative.    Past Medical History  Diagnosis Date  . GERD (gastroesophageal reflux disease)   . Anxiety   . Hyperlipidemia   . Hypertension   . Hx of colonic polyps   . Kidney stones   . Ventral hernia     History   Social History  . Marital Status: Divorced    Spouse Name: N/A    Number of Children: N/A  . Years of Education: N/A   Occupational History  . Not on file.   Social History Main Topics  . Smoking status: Never Smoker   . Smokeless tobacco: Not on file  . Alcohol Use: No  . Drug Use: No  . Sexually Active:    Other Topics Concern  . Not on file   Social History Narrative  . No narrative on file    Past Surgical History  Procedure Date  . Cesarean section   . Cholecystectomy   . Meniscus repair 2009  . Total knee arthroplasty 2011    left  . Knee arthroscopy dec 2011    right  .  Mouth surgery     teeth implants  . Hernia repair 02/08/11    ventral hernia    Family History  Problem Relation Age of Onset  . Thyroid disease Mother   . Hyperlipidemia Mother   . Heart attack Father 68  . Hypertension Father   . Arthritis Father     RA  . Coronary artery disease Father   . Coronary artery disease Brother   . Cancer Maternal Aunt     colon  . Cancer Maternal Grandmother     colon    No Known Allergies  Current Outpatient Prescriptions on File Prior to Visit  Medication Sig Dispense Refill  . aspirin 81 MG tablet Take 81 mg by mouth daily.        . cetirizine (ZYRTEC) 10 MG tablet Take 10 mg by mouth daily.        . citalopram (CELEXA) 20 MG tablet Take 1 tablet (20 mg total) by mouth daily.  90 tablet  3  . diclofenac (VOLTAREN) 75 MG EC tablet Take 1 tablet (75 mg total) by mouth 2 (two) times daily.  180 tablet  3  . esomeprazole (NEXIUM) 40 MG capsule Take 1 capsule (40 mg total) by mouth daily before breakfast.  90 capsule  3  .  hydrALAZINE (APRESOLINE) 25 MG tablet TAKE 1 TABLET BY MOUTH TWICE DAILY  180 tablet  3  . losartan-hydrochlorothiazide (HYZAAR) 100-25 MG per tablet Take 1 tablet by mouth daily.  90 tablet  3  . metoprolol (LOPRESSOR) 50 MG tablet Take 1 tablet (50 mg total) by mouth 2 (two) times daily.  180 tablet  3  . naproxen (NAPROSYN) 500 MG tablet Take 1 tablet (500 mg total) by mouth 2 (two) times daily.  20 tablet  0  . simvastatin (ZOCOR) 40 MG tablet Take 1 tablet (40 mg total) by mouth at bedtime.  90 tablet  3    BP 142/90  Pulse 87  Temp 98.5 F (36.9 C) (Oral)  Wt 309 lb (140.161 kg)  SpO2 99%chart    Objective:   Physical Exam  Constitutional: She appears well-developed and well-nourished.  Neck: Normal range of motion. Neck supple.  Cardiovascular: Normal rate, regular rhythm and normal heart sounds.   Pulmonary/Chest: Effort normal and breath sounds normal.  Abdominal: Soft. Bowel sounds are normal.    Musculoskeletal: She exhibits tenderness.       Tenderness to palpation of the left showed at the bursa. Positive empty can test to the left. Radial pulses are 2 out of 2. No evidence of swelling.  Tenderness to palpation of the left mid back, adjacent to the spinosis process. No tenderness to the spine.  Neurological: She is alert.  Skin: Skin is warm and dry.  Psychiatric: She has a normal mood and affect.          Assessment & Plan:  Assessment: Left shoulder bursitis, thoracic muscle strain  Plan: Refilled Vicodin. Prednisone 60x3, 40x3, 20x3. Toradol 60 mg IM x1 was given in the office today. Patient call the office if her symptoms worsen or persist. Recheck with PCP as scheduled and sooner when necessary.

## 2012-01-13 NOTE — Patient Instructions (Addendum)
Bursitis  Bursitis is a swelling and soreness (inflammation) of a fluid-filled sac (bursa) that overlies and protects a joint. It can be caused by injury, overuse of the joint, arthritis or infection. The joints most likely to be affected are the elbows, shoulders, hips and knees.  HOME CARE INSTRUCTIONS    Apply ice to the affected area for 15 to 20 minutes each hour while awake for 2 days. Put the ice in a plastic bag and place a towel between the bag of ice and your skin.   Rest the injured joint as much as possible, but continue to put the joint through a full range of motion, 4 times per day. (The shoulder joint especially becomes rapidly "frozen" if not used.) When the pain lessens, begin normal slow movements and usual activities.   Only take over-the-counter or prescription medicines for pain, discomfort or fever as directed by your caregiver.   Your caregiver may recommend draining the bursa and injecting medicine into the bursa. This may help the healing process.   Follow all instructions for follow-up with your caregiver. This includes any orthopedic referrals, physical therapy and rehabilitation. Any delay in obtaining necessary care could result in a delay or failure of the bursitis to heal and chronic pain.  SEEK IMMEDIATE MEDICAL CARE IF:    Your pain increases even during treatment.   You develop an oral temperature above 102 F (38.9 C) and have heat and inflammation over the involved bursa.  MAKE SURE YOU:    Understand these instructions.   Will watch your condition.   Will get help right away if you are not doing well or get worse.  Document Released: 06/07/2000 Document Revised: 05/30/2011 Document Reviewed: 05/12/2009  [employer] Patient Information 2012 [employer]

## 2012-01-21 ENCOUNTER — Other Ambulatory Visit: Payer: Self-pay | Admitting: Family

## 2012-01-21 NOTE — Telephone Encounter (Signed)
This was rx'd by Oran Rein

## 2012-04-03 ENCOUNTER — Ambulatory Visit: Payer: 59 | Admitting: Internal Medicine

## 2012-04-03 ENCOUNTER — Ambulatory Visit (INDEPENDENT_AMBULATORY_CARE_PROVIDER_SITE_OTHER): Payer: 59 | Admitting: Family Medicine

## 2012-04-03 ENCOUNTER — Encounter: Payer: Self-pay | Admitting: Family Medicine

## 2012-04-03 VITALS — BP 161/107 | HR 69 | Temp 97.3°F | Ht 66.0 in | Wt 316.4 lb

## 2012-04-03 DIAGNOSIS — R351 Nocturia: Secondary | ICD-10-CM

## 2012-04-03 DIAGNOSIS — F411 Generalized anxiety disorder: Secondary | ICD-10-CM

## 2012-04-03 DIAGNOSIS — J300 Vasomotor rhinitis: Secondary | ICD-10-CM

## 2012-04-03 DIAGNOSIS — I10 Essential (primary) hypertension: Secondary | ICD-10-CM

## 2012-04-03 DIAGNOSIS — G4730 Sleep apnea, unspecified: Secondary | ICD-10-CM

## 2012-04-03 DIAGNOSIS — K219 Gastro-esophageal reflux disease without esophagitis: Secondary | ICD-10-CM

## 2012-04-03 DIAGNOSIS — R7989 Other specified abnormal findings of blood chemistry: Secondary | ICD-10-CM

## 2012-04-03 DIAGNOSIS — F3289 Other specified depressive episodes: Secondary | ICD-10-CM

## 2012-04-03 DIAGNOSIS — IMO0002 Reserved for concepts with insufficient information to code with codable children: Secondary | ICD-10-CM

## 2012-04-03 DIAGNOSIS — M1710 Unilateral primary osteoarthritis, unspecified knee: Secondary | ICD-10-CM

## 2012-04-03 DIAGNOSIS — F32A Depression, unspecified: Secondary | ICD-10-CM

## 2012-04-03 DIAGNOSIS — IMO0001 Reserved for inherently not codable concepts without codable children: Secondary | ICD-10-CM

## 2012-04-03 DIAGNOSIS — F329 Major depressive disorder, single episode, unspecified: Secondary | ICD-10-CM

## 2012-04-03 DIAGNOSIS — E785 Hyperlipidemia, unspecified: Secondary | ICD-10-CM

## 2012-04-03 DIAGNOSIS — M179 Osteoarthritis of knee, unspecified: Secondary | ICD-10-CM

## 2012-04-03 DIAGNOSIS — D649 Anemia, unspecified: Secondary | ICD-10-CM

## 2012-04-03 DIAGNOSIS — J309 Allergic rhinitis, unspecified: Secondary | ICD-10-CM

## 2012-04-03 DIAGNOSIS — M1990 Unspecified osteoarthritis, unspecified site: Secondary | ICD-10-CM

## 2012-04-03 LAB — POCT URINALYSIS DIPSTICK
Bilirubin, UA: NEGATIVE
Blood, UA: NEGATIVE
Glucose, UA: NEGATIVE
Ketones, UA: NEGATIVE
Spec Grav, UA: 1.02
Urobilinogen, UA: 0.2

## 2012-04-03 MED ORDER — ESCITALOPRAM OXALATE 20 MG PO TABS: 20.0000 mg | ORAL_TABLET | Freq: Every day | ORAL | Status: DC

## 2012-04-03 MED ORDER — DICLOFENAC SODIUM 75 MG PO TBEC: 75.0000 mg | DELAYED_RELEASE_TABLET | Freq: Every day | ORAL | Status: DC

## 2012-04-03 MED ORDER — AZELASTINE HCL 0.1 % NA SOLN: 1.0000 | Freq: Two times a day (BID) | NASAL | Status: DC

## 2012-04-03 MED ORDER — NEBIVOLOL HCL 10 MG PO TABS: 10.0000 mg | ORAL_TABLET | Freq: Every day | ORAL | Status: DC

## 2012-04-03 MED ORDER — ALPRAZOLAM 0.25 MG PO TABS: 0.2500 mg | ORAL_TABLET | Freq: Every evening | ORAL | Status: DC | PRN

## 2012-04-03 MED ORDER — RANITIDINE HCL 300 MG PO TABS: 300.0000 mg | ORAL_TABLET | Freq: Every day | ORAL | Status: DC

## 2012-04-03 NOTE — Patient Instructions (Addendum)
Vasomotor rhinitis   Preventive Care for Adults, Female A healthy lifestyle and preventive care can promote health and wellness. Preventive health guidelines for women include the following key practices.  A routine yearly physical is a good way to check with your caregiver about your health and preventive screening. It is a chance to share any concerns and updates on your health, and to receive a thorough exam.  Visit your dentist for a routine exam and preventive care every 6 months. Brush your teeth twice a day and floss once a day. Good oral hygiene prevents tooth decay and gum disease.  The frequency of eye exams is based on your age, health, family medical history, use of contact lenses, and other factors. Follow your caregiver's recommendations for frequency of eye exams.  Eat a healthy diet. Foods like vegetables, fruits, whole grains, low-fat dairy products, and lean protein foods contain the nutrients you need without too many calories. Decrease your intake of foods high in solid fats, added sugars, and salt. Eat the right amount of calories for you.Get information about a proper diet from your caregiver, if necessary.  Regular physical exercise is one of the most important things you can do for your health. Most adults should get at least 150 minutes of moderate-intensity exercise (any activity that increases your heart rate and causes you to sweat) each week. In addition, most adults need muscle-strengthening exercises on 2 or more days a week.  Maintain a healthy weight. The body mass index (BMI) is a screening tool to identify possible weight problems. It provides an estimate of body fat based on height and weight. Your caregiver can help determine your BMI, and can help you achieve or maintain a healthy weight.For adults 20 years and older:  A BMI below 18.5 is considered underweight.  A BMI of 18.5 to 24.9 is normal.  A BMI of 25 to 29.9 is considered overweight.  A BMI of  30 and above is considered obese.  Maintain normal blood lipids and cholesterol levels by exercising and minimizing your intake of saturated fat. Eat a balanced diet with plenty of fruit and vegetables. Blood tests for lipids and cholesterol should begin at age 61 and be repeated every 5 years. If your lipid or cholesterol levels are high, you are over 50, or you are at high risk for heart disease, you may need your cholesterol levels checked more frequently.Ongoing high lipid and cholesterol levels should be treated with medicines if diet and exercise are not effective.  If you smoke, find out from your caregiver how to quit. If you do not use tobacco, do not start.  If you are pregnant, do not drink alcohol. If you are breastfeeding, be very cautious about drinking alcohol. If you are not pregnant and choose to drink alcohol, do not exceed 1 drink per day. One drink is considered to be 12 ounces (355 mL) of beer, 5 ounces (148 mL) of wine, or 1.5 ounces (44 mL) of liquor.  Avoid use of street drugs. Do not share needles with anyone. Ask for help if you need support or instructions about stopping the use of drugs.  High blood pressure causes heart disease and increases the risk of stroke. Your blood pressure should be checked at least every 1 to 2 years. Ongoing high blood pressure should be treated with medicines if weight loss and exercise are not effective.  If you are 48 to 61 years old, ask your caregiver if you should take aspirin to  prevent strokes.  Diabetes screening involves taking a blood sample to check your fasting blood sugar level. This should be done once every 3 years, after age 95, if you are within normal weight and without risk factors for diabetes. Testing should be considered at a younger age or be carried out more frequently if you are overweight and have at least 1 risk factor for diabetes.  Breast cancer screening is essential preventive care for women. You should practice  "breast self-awareness." This means understanding the normal appearance and feel of your breasts and may include breast self-examination. Any changes detected, no matter how small, should be reported to a caregiver. Women in their 64s and 30s should have a clinical breast exam (CBE) by a caregiver as part of a regular health exam every 1 to 3 years. After age 72, women should have a CBE every year. Starting at age 72, women should consider having a mammography (breast X-ray test) every year. Women who have a family history of breast cancer should talk to their caregiver about genetic screening. Women at a high risk of breast cancer should talk to their caregivers about having magnetic resonance imaging (MRI) and a mammography every year.  The Pap test is a screening test for cervical cancer. A Pap test can show cell changes on the cervix that might become cervical cancer if left untreated. A Pap test is a procedure in which cells are obtained and examined from the lower end of the uterus (cervix).  Women should have a Pap test starting at age 60.  Between ages 80 and 62, Pap tests should be repeated every 2 years.  Beginning at age 25, you should have a Pap test every 3 years as long as the past 3 Pap tests have been normal.  Some women have medical problems that increase the chance of getting cervical cancer. Talk to your caregiver about these problems. It is especially important to talk to your caregiver if a new problem develops soon after your last Pap test. In these cases, your caregiver may recommend more frequent screening and Pap tests.  The above recommendations are the same for women who have or have not gotten the vaccine for human papillomavirus (HPV).  If you had a hysterectomy for a problem that was not cancer or a condition that could lead to cancer, then you no longer need Pap tests. Even if you no longer need a Pap test, a regular exam is a good idea to make sure no other problems are  starting.  If you are between ages 65 and 54, and you have had normal Pap tests going back 10 years, you no longer need Pap tests. Even if you no longer need a Pap test, a regular exam is a good idea to make sure no other problems are starting.  If you have had past treatment for cervical cancer or a condition that could lead to cancer, you need Pap tests and screening for cancer for at least 20 years after your treatment.  If Pap tests have been discontinued, risk factors (such as a new sexual partner) need to be reassessed to determine if screening should be resumed.  The HPV test is an additional test that may be used for cervical cancer screening. The HPV test looks for the virus that can cause the cell changes on the cervix. The cells collected during the Pap test can be tested for HPV. The HPV test could be used to screen women aged 30 years  and older, and should be used in women of any age who have unclear Pap test results. After the age of 81, women should have HPV testing at the same frequency as a Pap test.  Colorectal cancer can be detected and often prevented. Most routine colorectal cancer screening begins at the age of 37 and continues through age 83. However, your caregiver may recommend screening at an earlier age if you have risk factors for colon cancer. On a yearly basis, your caregiver may provide home test kits to check for hidden blood in the stool. Use of a small camera at the end of a tube, to directly examine the colon (sigmoidoscopy or colonoscopy), can detect the earliest forms of colorectal cancer. Talk to your caregiver about this at age 74, when routine screening begins. Direct examination of the colon should be repeated every 5 to 10 years through age 46, unless early forms of pre-cancerous polyps or small growths are found.  Hepatitis C blood testing is recommended for all people born from [birth year] through 1965 and any individual with known risks for hepatitis C.  Practice  safe sex. Use condoms and avoid high-risk sexual practices to reduce the spread of sexually transmitted infections (STIs). STIs include gonorrhea, chlamydia, syphilis, trichomonas, herpes, HPV, and human immunodeficiency virus (HIV). Herpes, HIV, and HPV are viral illnesses that have no cure. They can result in disability, cancer, and death. Sexually active women aged 59 and younger should be checked for chlamydia. Older women with new or multiple partners should also be tested for chlamydia. Testing for other STIs is recommended if you are sexually active and at increased risk.  Osteoporosis is a disease in which the bones lose minerals and strength with aging. This can result in serious bone fractures. The risk of osteoporosis can be identified using a bone density scan. Women ages 48 and over and women at risk for fractures or osteoporosis should discuss screening with their caregivers. Ask your caregiver whether you should take a calcium supplement or vitamin D to reduce the rate of osteoporosis.  Menopause can be associated with physical symptoms and risks. Hormone replacement therapy is available to decrease symptoms and risks. You should talk to your caregiver about whether hormone replacement therapy is right for you.  Use sunscreen with sun protection factor (SPF) of 30 or more. Apply sunscreen liberally and repeatedly throughout the day. You should seek shade when your shadow is shorter than you. Protect yourself by wearing long sleeves, pants, a wide-brimmed hat, and sunglasses year round, whenever you are outdoors.  Once a month, do a whole body skin exam, using a mirror to look at the skin on your back. Notify your caregiver of new moles, moles that have irregular borders, moles that are larger than a pencil eraser, or moles that have changed in shape or color.  Stay current with required immunizations.  Influenza. You need a dose every fall (or winter). The composition of the flu vaccine  changes each year, so being vaccinated once is not enough.  Pneumococcal polysaccharide. You need 1 to 2 doses if you smoke cigarettes or if you have certain chronic medical conditions. You need 1 dose at age 29 (or older) if you have never been vaccinated.  Tetanus, diphtheria, pertussis (Tdap, Td). Get 1 dose of Tdap vaccine if you are younger than age 71, are over 76 and have contact with an infant, are a Research scientist (physical sciences), are pregnant, or simply want to be protected from whooping cough. After that,  you need a Td booster dose every 10 years. Consult your caregiver if you have not had at least 3 tetanus and diphtheria-containing shots sometime in your life or have a deep or dirty wound.  HPV. You need this vaccine if you are a woman age 2 or younger. The vaccine is given in 3 doses over 6 months.  Measles, mumps, rubella (MMR). You need at least 1 dose of MMR if you were born in 1957 or later. You may also need a second dose.  Meningococcal. If you are age 29 to 40 and a first-year college student living in a residence hall, or have one of several medical conditions, you need to get vaccinated against meningococcal disease. You may also need additional booster doses.  Zoster (shingles). If you are age 57 or older, you should get this vaccine.  Varicella (chickenpox). If you have never had chickenpox or you were vaccinated but received only 1 dose, talk to your caregiver to find out if you need this vaccine.  Hepatitis A. You need this vaccine if you have a specific risk factor for hepatitis A virus infection or you simply wish to be protected from this disease. The vaccine is usually given as 2 doses, 6 to 18 months apart.  Hepatitis B. You need this vaccine if you have a specific risk factor for hepatitis B virus infection or you simply wish to be protected from this disease. The vaccine is given in 3 doses, usually over 6 months. Preventive Services / Frequency Ages 25 to 27  Blood  pressure check.** / Every 1 to 2 years.  Lipid and cholesterol check.** / Every 5 years beginning at age 61.  Clinical breast exam.** / Every 3 years for women in their 64s and 30s.  Pap test.** / Every 2 years from ages 69 through 43. Every 3 years starting at age 55 through age 42 or 19 with a history of 3 consecutive normal Pap tests.  HPV screening.** / Every 3 years from ages 64 through ages 63 to 71 with a history of 3 consecutive normal Pap tests.  Hepatitis C blood test.** / For any individual with known risks for hepatitis C.  Skin self-exam. / Monthly.  Influenza immunization.** / Every year.  Pneumococcal polysaccharide immunization.** / 1 to 2 doses if you smoke cigarettes or if you have certain chronic medical conditions.  Tetanus, diphtheria, pertussis (Tdap, Td) immunization. / A one-time dose of Tdap vaccine. After that, you need a Td booster dose every 10 years.  HPV immunization. / 3 doses over 6 months, if you are 16 and younger.  Measles, mumps, rubella (MMR) immunization. / You need at least 1 dose of MMR if you were born in 1957 or later. You may also need a second dose.  Meningococcal immunization. / 1 dose if you are age 38 to 88 and a first-year college student living in a residence hall, or have one of several medical conditions, you need to get vaccinated against meningococcal disease. You may also need additional booster doses.  Varicella immunization.** / Consult your caregiver.  Hepatitis A immunization.** / Consult your caregiver. 2 doses, 6 to 18 months apart.  Hepatitis B immunization.** / Consult your caregiver. 3 doses usually over 6 months. Ages 9 to 26  Blood pressure check.** / Every 1 to 2 years.  Lipid and cholesterol check.** / Every 5 years beginning at age 4.  Clinical breast exam.** / Every year after age 39.  Mammogram.** / Every year  beginning at age 79 and continuing for as long as you are in good health. Consult with your  caregiver.  Pap test.** / Every 3 years starting at age 33 through age 62 or 76 with a history of 3 consecutive normal Pap tests.  HPV screening.** / Every 3 years from ages 84 through ages 62 to 61 with a history of 3 consecutive normal Pap tests.  Fecal occult blood test (FOBT) of stool. / Every year beginning at age 52 and continuing until age 56. You may not need to do this test if you get a colonoscopy every 10 years.  Flexible sigmoidoscopy or colonoscopy.** / Every 5 years for a flexible sigmoidoscopy or every 10 years for a colonoscopy beginning at age 47 and continuing until age 29.  Hepatitis C blood test.** / For all people born from [birth year] through 1965 and any individual with known risks for hepatitis C.  Skin self-exam. / Monthly.  Influenza immunization.** / Every year.  Pneumococcal polysaccharide immunization.** / 1 to 2 doses if you smoke cigarettes or if you have certain chronic medical conditions.  Tetanus, diphtheria, pertussis (Tdap, Td) immunization.** / A one-time dose of Tdap vaccine. After that, you need a Td booster dose every 10 years.  Measles, mumps, rubella (MMR) immunization. / You need at least 1 dose of MMR if you were born in 1957 or later. You may also need a second dose.  Varicella immunization.** / Consult your caregiver.  Meningococcal immunization.** / Consult your caregiver.  Hepatitis A immunization.** / Consult your caregiver. 2 doses, 6 to 18 months apart.  Hepatitis B immunization.** / Consult your caregiver. 3 doses, usually over 6 months. Ages 46 and over  Blood pressure check.** / Every 1 to 2 years.  Lipid and cholesterol check.** / Every 5 years beginning at age 80.  Clinical breast exam.** / Every year after age 52.  Mammogram.** / Every year beginning at age 46 and continuing for as long as you are in good health. Consult with your caregiver.  Pap test.** / Every 3 years starting at age 66 through age 24 or 75 with a 3  consecutive normal Pap tests. Testing can be stopped between 65 and 70 with 3 consecutive normal Pap tests and no abnormal Pap or HPV tests in the past 10 years.  HPV screening.** / Every 3 years from ages 25 through ages 57 or 22 with a history of 3 consecutive normal Pap tests. Testing can be stopped between 65 and 70 with 3 consecutive normal Pap tests and no abnormal Pap or HPV tests in the past 10 years.  Fecal occult blood test (FOBT) of stool. / Every year beginning at age 84 and continuing until age 7. You may not need to do this test if you get a colonoscopy every 10 years.  Flexible sigmoidoscopy or colonoscopy.** / Every 5 years for a flexible sigmoidoscopy or every 10 years for a colonoscopy beginning at age 82 and continuing until age 54.  Hepatitis C blood test.** / For all people born from [birth year] through 1965 and any individual with known risks for hepatitis C.  Osteoporosis screening.** / A one-time screening for women ages 32 and over and women at risk for fractures or osteoporosis.  Skin self-exam. / Monthly.  Influenza immunization.** / Every year.  Pneumococcal polysaccharide immunization.** / 1 dose at age 63 (or older) if you have never been vaccinated.  Tetanus, diphtheria, pertussis (Tdap, Td) immunization. / A one-time dose of Tdap vaccine  if you are over 65 and have contact with an infant, are a Research scientist (physical sciences), or simply want to be protected from whooping cough. After that, you need a Td booster dose every 10 years.  Varicella immunization.** / Consult your caregiver.  Meningococcal immunization.** / Consult your caregiver.  Hepatitis A immunization.** / Consult your caregiver. 2 doses, 6 to 18 months apart.  Hepatitis B immunization.** / Check with your caregiver. 3 doses, usually over 6 months. ** Family history and personal history of risk and conditions may change your caregiver's recommendations. Document Released: 08/06/2001 Document Revised: 09/02/2011  Document Reviewed: 11/05/2010 [facility] Patient Information 2013 [place], [place].

## 2012-04-05 ENCOUNTER — Encounter: Payer: Self-pay | Admitting: Family Medicine

## 2012-04-05 DIAGNOSIS — R7989 Other specified abnormal findings of blood chemistry: Secondary | ICD-10-CM

## 2012-04-05 DIAGNOSIS — M1710 Unilateral primary osteoarthritis, unspecified knee: Secondary | ICD-10-CM

## 2012-04-05 DIAGNOSIS — J300 Vasomotor rhinitis: Secondary | ICD-10-CM

## 2012-04-05 DIAGNOSIS — D649 Anemia, unspecified: Secondary | ICD-10-CM

## 2012-04-05 DIAGNOSIS — G4730 Sleep apnea, unspecified: Secondary | ICD-10-CM

## 2012-04-05 DIAGNOSIS — M179 Osteoarthritis of knee, unspecified: Secondary | ICD-10-CM

## 2012-04-05 DIAGNOSIS — G4733 Obstructive sleep apnea (adult) (pediatric): Secondary | ICD-10-CM | POA: Insufficient documentation

## 2012-04-05 HISTORY — DX: Vasomotor rhinitis: J30.0

## 2012-04-05 HISTORY — DX: Other specified abnormal findings of blood chemistry: R79.89

## 2012-04-05 HISTORY — DX: Unilateral primary osteoarthritis, unspecified knee: M17.10

## 2012-04-05 HISTORY — DX: Sleep apnea, unspecified: G47.30

## 2012-04-05 HISTORY — DX: Osteoarthritis of knee, unspecified: M17.9

## 2012-04-05 HISTORY — DX: Anemia, unspecified: D64.9

## 2012-04-05 NOTE — Assessment & Plan Note (Signed)
switch to Lexapro 20 mg daily and may use Alprazolam prn, we will stop Citalopram. Reassess at next visit

## 2012-04-05 NOTE — Assessment & Plan Note (Signed)
Astelin is prescribed to see if it helps.

## 2012-04-05 NOTE — Assessment & Plan Note (Signed)
Continue Nexium and add Ranitidine 300 mg po qhs

## 2012-04-05 NOTE — Assessment & Plan Note (Signed)
Has not been using her CPAP machine, she agrees to try again. She will take out her machine and replace the mask and tubing and we will prescribe meds to help with sleep if she needs this.

## 2012-04-05 NOTE — Assessment & Plan Note (Signed)
Switch to Bystolic 10 mg daily, DASH diet, no other changes today

## 2012-04-05 NOTE — Assessment & Plan Note (Signed)
Tolerating Simvastatin, no changes today, avoid trans fats, start MegaRed.

## 2012-04-05 NOTE — Progress Notes (Signed)
[name], female   DOB: [birth year], 61 y.o.   MRN: [id number] ZAKYRIA METZINGER [id number] [birth year] 04/05/2012      Progress Note New Patient  Subjective  Chief Complaint  Chief Complaint  Patient presents with  . Establish Care    new patient  . Anxiety  . Medication Refill    HPI  This is a 61 year old Caucasian female who is in today to establish care. She does have a great tragedy in the last 2 weeks when her father was hit while driving his vehicle until. She is now living in the home with her mother to help care for her and she also works full-time. She's been under great stress in her citalopram is no longer working. We see multiple family members of hers and so she is transferring her care here to help her get through this rough time. She is struggling with her heartburn. Only partially controlled. She wakes up with dyspepsia and a brackish fluid in her throat frequently. No chest pain or palpitations no significant shortness of breath. She does have sleep apnea but is not using her machine she has restless sleep. She does know when she leans forward she frequently has clear rhinorrhea. She struggles with a lot of osteoarthritis and has significant stiffness and pain in her knees worse on the left than the right. She has trouble intermittently with peripheral edema but this has not worsened recently. She gets her flu shot at work.  Past Medical History  Diagnosis Date  . GERD (gastroesophageal reflux disease)   . Anxiety   . Hyperlipidemia   . Hypertension   . Hx of colonic polyps   . Kidney stones   . Ventral hernia   . Chicken pox as a child  . Measles as a child  . Mumps as a child  . Obesity   . Vasomotor rhinitis 04/05/2012  . Sleep apnea 04/05/2012    Past Surgical History  Procedure Date  . Cholecystectomy   . Meniscus repair 2009  . Total knee arthroplasty 2011    left  . Knee arthroscopy dec 2011    right  . Mouth surgery     teeth  implants  . Hernia repair 02/08/11    ventral hernia  . Cesarean section     X 3  . Wisdom tooth extraction 2000    Family History  Problem Relation Age of Onset  . Thyroid disease Mother   . Hyperlipidemia Mother   . Heart attack Father 92  . Hypertension Father   . Arthritis Father     RA  . Coronary artery disease Father   . Coronary artery disease Brother   . Cancer Maternal Aunt     colon  . Cancer Maternal Grandmother     colon  . Heart attack Maternal Grandfather   . Alcohol abuse Paternal Grandfather     History   Social History  . Marital Status: Divorced    Spouse Name: N/A    Number of Children: N/A  . Years of Education: N/A   Occupational History  . Not on file.   Social History Main Topics  . Smoking status: Never Smoker   . Smokeless tobacco: Never Used  . Alcohol Use: No  . Drug Use: No  . Sexually Active: No   Other Topics Concern  . Not on file   Social History Narrative  . No narrative on file    Current Outpatient Prescriptions on File  Prior to Visit  Medication Sig Dispense Refill  . aspirin 81 MG tablet Take 81 mg by mouth daily.        . cetirizine (ZYRTEC) 10 MG tablet Take 10 mg by mouth daily.        Marland Kitchen esomeprazole (NEXIUM) 40 MG capsule Take 1 capsule (40 mg total) by mouth daily before breakfast.  90 capsule  3  . hydrALAZINE (APRESOLINE) 25 MG tablet TAKE 1 TABLET BY MOUTH TWICE DAILY  180 tablet  3  . losartan-hydrochlorothiazide (HYZAAR) 100-25 MG per tablet Take 1 tablet by mouth daily.  90 tablet  3  . simvastatin (ZOCOR) 40 MG tablet Take 1 tablet (40 mg total) by mouth at bedtime.  90 tablet  3  . azelastine (ASTELIN) 137 MCG/SPRAY nasal spray Place 1 spray into the nose 2 (two) times daily. Use in each nostril as directed  30 mL  12  . escitalopram (LEXAPRO) 20 MG tablet Take 1 tablet (20 mg total) by mouth daily.  30 tablet  2  . nebivolol (BYSTOLIC) 10 MG tablet Take 1 tablet (10 mg total) by mouth daily.  56 tablet  0    . ranitidine (ZANTAC) 300 MG tablet Take 1 tablet (300 mg total) by mouth at bedtime.  30 tablet  5    No Known Allergies  Review of Systems  Review of Systems  Constitutional: Negative for fever, chills and malaise/fatigue.  HENT: Positive for congestion. Negative for hearing loss and nosebleeds.   Eyes: Negative for discharge.  Respiratory: Negative for cough, sputum production, shortness of breath and wheezing.   Cardiovascular: Negative for chest pain, palpitations and leg swelling.  Gastrointestinal: Positive for heartburn. Negative for nausea, vomiting, abdominal pain, diarrhea, constipation and blood in stool.  Genitourinary: Negative for dysuria, urgency, frequency and hematuria.  Musculoskeletal: Positive for joint pain. Negative for myalgias, back pain and falls.  Skin: Negative for rash.  Neurological: Negative for dizziness, tremors, sensory change, focal weakness, loss of consciousness, weakness and headaches.  Endo/Heme/Allergies: Negative for polydipsia. Does not bruise/bleed easily.  Psychiatric/Behavioral: Positive for depression. Negative for suicidal ideas. The patient is nervous/anxious. The patient does not have insomnia.     Objective  BP 161/107  Pulse 69  Temp 97.3 F (36.3 C) (Temporal)  Ht 5\' 6"  (1.676 m)  Wt 316 lb 6.4 oz (143.518 kg)  BMI 51.07 kg/m2  SpO2 92%  Physical Exam  Physical Exam  Constitutional: She is oriented to person, place, and time and well-developed, well-nourished, and in no distress. No distress.       obese  HENT:  Head: Normocephalic and atraumatic.  Right Ear: External ear normal.  Left Ear: External ear normal.  Nose: Nose normal.  Mouth/Throat: Oropharynx is clear and moist. No oropharyngeal exudate.  Eyes: Conjunctivae normal are normal. Pupils are equal, round, and reactive to light. Right eye exhibits no discharge. Left eye exhibits no discharge. No scleral icterus.  Neck: Normal range of motion. Neck supple. No  thyromegaly present.  Cardiovascular: Normal rate, regular rhythm, normal heart sounds and intact distal pulses.   Pulmonary/Chest: Effort normal and breath sounds normal. No respiratory distress. She has no wheezes. She has no rales.  Abdominal: Soft. Bowel sounds are normal. She exhibits no distension and no mass. There is no tenderness.  Musculoskeletal: Normal range of motion. She exhibits no edema and no tenderness.  Lymphadenopathy:    She has no cervical adenopathy.  Neurological: She is alert and oriented to person, place,  and time. She has normal reflexes. No cranial nerve deficit. Coordination normal.  Skin: Skin is warm and dry. No rash noted. She is not diaphoretic.  Psychiatric: Mood, memory and affect normal.       Assessment & Plan  Vasomotor rhinitis Astelin is prescribed to see if it helps.  ANXIETY switch to Lexapro 20 mg daily and may use Alprazolam prn, we will stop Citalopram. Reassess at next visit  HYPERTENSION Switch to Bystolic 10 mg daily, DASH diet, no other changes today  HYPERLIPIDEMIA Tolerating Simvastatin, no changes today, avoid trans fats, start MegaRed.  GERD Continue Nexium and add Ranitidine 300 mg po qhs  Sleep apnea Has not been using her CPAP machine, she agrees to try again. She will take out her machine and replace the mask and tubing and we will prescribe meds to help with sleep if she needs this.

## 2012-04-27 ENCOUNTER — Other Ambulatory Visit: Payer: Self-pay | Admitting: Internal Medicine

## 2012-05-06 ENCOUNTER — Telehealth: Payer: Self-pay | Admitting: Family Medicine

## 2012-05-06 NOTE — Telephone Encounter (Signed)
Patient is due to have bloodwork done. She would like to have it done at the lab at [facility]. Please contact patient to let her know when she can go.

## 2012-05-06 NOTE — Telephone Encounter (Signed)
Patient would like to change dosage back to what she used to take 2/day. She said she is having trouble walking when she only takes one a day due to the pain.

## 2012-05-06 NOTE — Telephone Encounter (Signed)
Please advise 

## 2012-05-06 NOTE — Telephone Encounter (Signed)
OK to give refill on Diclofenac with same strength and same sig to [facility] pharmacy, disp #180 with 1 rf. Looks like she needs annual labs I am willing to order them at [facility], please just check when she wants to do them and how I go ahead and order them and I will do it

## 2012-05-07 ENCOUNTER — Other Ambulatory Visit: Payer: Self-pay | Admitting: Family Medicine

## 2012-05-07 DIAGNOSIS — Z0000 Encounter for general adult medical examination without abnormal findings: Secondary | ICD-10-CM

## 2012-05-07 MED ORDER — DICLOFENAC SODIUM 75 MG PO TBEC: 75.0000 mg | DELAYED_RELEASE_TABLET | Freq: Two times a day (BID) | ORAL | Status: DC

## 2012-05-07 NOTE — Telephone Encounter (Signed)
I sent RX to pharmacy.   Pt would like to go tomorrow to do labs. Please advise

## 2012-05-11 ENCOUNTER — Other Ambulatory Visit: Payer: Self-pay | Admitting: Internal Medicine

## 2012-05-13 ENCOUNTER — Ambulatory Visit (INDEPENDENT_AMBULATORY_CARE_PROVIDER_SITE_OTHER): Payer: 59 | Admitting: Family Medicine

## 2012-05-13 ENCOUNTER — Encounter: Payer: Self-pay | Admitting: Family Medicine

## 2012-05-13 VITALS — BP 144/82 | HR 72 | Temp 97.0°F | Ht 66.0 in | Wt 305.1 lb

## 2012-05-13 DIAGNOSIS — E785 Hyperlipidemia, unspecified: Secondary | ICD-10-CM

## 2012-05-13 DIAGNOSIS — D649 Anemia, unspecified: Secondary | ICD-10-CM

## 2012-05-13 DIAGNOSIS — I10 Essential (primary) hypertension: Secondary | ICD-10-CM

## 2012-05-13 DIAGNOSIS — F411 Generalized anxiety disorder: Secondary | ICD-10-CM

## 2012-05-13 DIAGNOSIS — R7989 Other specified abnormal findings of blood chemistry: Secondary | ICD-10-CM

## 2012-05-13 MED ORDER — NEBIVOLOL HCL 10 MG PO TABS: 10.0000 mg | ORAL_TABLET | Freq: Every day | ORAL | Status: DC

## 2012-05-13 NOTE — Assessment & Plan Note (Signed)
Mild historically and resolved on recent labs. ALT 28, AST 22 on 05/08/12. Will check an acute hepatitis panel with next blood draw

## 2012-05-13 NOTE — Assessment & Plan Note (Signed)
Tolerated Bystolic well, brings in bp log to review systolic ranged from 100 to 126 and diastolic ranged from 56-78. Only exception is when she did not take her med and it was 141/102. Given more samples and a prescription today.

## 2012-05-13 NOTE — Patient Instructions (Addendum)

## 2012-05-13 NOTE — Progress Notes (Signed)
[name], female   DOB: [birth year], 61 y.o.   MRN: [id number] [name] [id number] 1950-07-09 05/13/2012      Progress Note-Follow Up  Subjective  Chief Complaint  Chief Complaint  Patient presents with  . Follow-up    6 week    HPI  Patient is a 60 year old Caucasian female who is in today for followup on her initial patient appointment. She generally feels well and is here today to review labs. She continues to struggle with the recent loss of her father but is doing well. Chest pain palpitations, illness, headache, shortness of breath, GI or GU complaints are noted today. She has tolerated the Bystolic well. Denies any constipation or fatigue.  Past Medical History  Diagnosis Date  . GERD (gastroesophageal reflux disease)   . Anxiety   . Hyperlipidemia   . Hypertension   . Hx of colonic polyps   . Kidney stones   . Ventral hernia   . Chicken pox as a child  . Measles as a child  . Mumps as a child  . Obesity   . Vasomotor rhinitis 04/05/2012  . Sleep apnea 04/05/2012  . Anemia 04/05/2012  . Elevated LFTs 04/05/2012  . OA (osteoarthritis) of knee 04/05/2012    Past Surgical History  Procedure Date  . Cholecystectomy   . Meniscus repair 2009  . Total knee arthroplasty 2011    left  . Knee arthroscopy dec 2011    right  . Mouth surgery     teeth implants  . Hernia repair 02/08/11    ventral hernia  . Cesarean section     X 3  . Wisdom tooth extraction 2000    Family History  Problem Relation Age of Onset  . Thyroid disease Mother   . Hyperlipidemia Mother   . Heart attack Father 15  . Hypertension Father   . Arthritis Father     RA  . Coronary artery disease Father   . Coronary artery disease Brother   . Cancer Maternal Aunt     colon  . Cancer Maternal Grandmother     colon  . Heart attack Maternal Grandfather   . Alcohol abuse Paternal Grandfather     History   Social History  . Marital Status: Divorced   Spouse Name: N/A    Number of Children: N/A  . Years of Education: N/A   Occupational History  . Not on file.   Social History Main Topics  . Smoking status: Never Smoker   . Smokeless tobacco: Never Used  . Alcohol Use: No  . Drug Use: No  . Sexually Active: No   Other Topics Concern  . Not on file   Social History Narrative  . No narrative on file    Current Outpatient Prescriptions on File Prior to Visit  Medication Sig Dispense Refill  . aspirin 81 MG tablet Take 81 mg by mouth daily.        Marland Kitchen azelastine (ASTELIN) 137 MCG/SPRAY nasal spray Place 1 spray into the nose 2 (two) times daily. Use in each nostril as directed  30 mL  12  . cetirizine (ZYRTEC) 10 MG tablet Take 10 mg by mouth daily.        . diclofenac (VOLTAREN) 75 MG EC tablet Take 1 tablet (75 mg total) by mouth 2 (two) times daily.  180 tablet  1  . escitalopram (LEXAPRO) 20 MG tablet Take 1 tablet (20 mg total) by mouth daily.  30  tablet  2  . hydrALAZINE (APRESOLINE) 25 MG tablet TAKE 1 TABLET BY MOUTH TWICE DAILY  180 tablet  3  . losartan-hydrochlorothiazide (HYZAAR) 100-25 MG per tablet TAKE 1 TABLET BY MOUTH DAILY.  90 tablet  3  . NEXIUM 40 MG capsule TAKE 1 CAPSULE BY MOUTH DAILY BEFORE BREAKFAST.  90 capsule  1  . ranitidine (ZANTAC) 300 MG tablet Take 1 tablet (300 mg total) by mouth at bedtime.  30 tablet  5  . simvastatin (ZOCOR) 40 MG tablet Take 1 tablet (40 mg total) by mouth at bedtime.  90 tablet  3  . [DISCONTINUED] nebivolol (BYSTOLIC) 10 MG tablet Take 1 tablet (10 mg total) by mouth daily.  56 tablet  0  . ALPRAZolam (XANAX) 0.25 MG tablet Take 1 tablet (0.25 mg total) by mouth at bedtime as needed for sleep.  30 tablet  0    No Known Allergies  Review of Systems  ROS  Objective  BP 144/82  Pulse 72  Temp 97 F (36.1 C) (Temporal)  Ht 5\' 6"  (1.676 m)  Wt 305 lb 1.9 oz (138.402 kg)  BMI 49.25 kg/m2  SpO2 96%  Physical Exam  Physical Exam  Constitutional: She is oriented to  person, place, and time and well-developed, well-nourished, and in no distress. No distress.  HENT:  Head: Normocephalic and atraumatic.  Eyes: Conjunctivae normal are normal.  Neck: Neck supple. No thyromegaly present.  Cardiovascular: Normal rate, regular rhythm and normal heart sounds.   No murmur heard. Pulmonary/Chest: Effort normal and breath sounds normal. She has no wheezes.  Abdominal: She exhibits no distension and no mass.  Musculoskeletal: She exhibits no edema.  Lymphadenopathy:    She has no cervical adenopathy.  Neurological: She is alert and oriented to person, place, and time.  Skin: Skin is warm and dry. No rash noted. She is not diaphoretic.  Psychiatric: Memory, affect and judgment normal.    Lab Results  Component Value Date   TSH 1.40 06/15/2009   Lab Results  Component Value Date   WBC 7.8 02/05/2011   HGB 14.6 02/05/2011   HCT 43.4 02/05/2011   MCV 89.7 02/05/2011   PLT 272 02/05/2011   Lab Results  Component Value Date   CREATININE 0.83 02/05/2011   BUN 24* 02/05/2011   NA 137 02/05/2011   K 4.6 02/05/2011   CL 99 02/05/2011   CO2 32 02/05/2011   Lab Results  Component Value Date   ALT 36* 02/05/2011   AST 24 02/05/2011   ALKPHOS 84 02/05/2011   BILITOT 0.3 02/05/2011   Lab Results  Component Value Date   CHOL 172 06/15/2009   Lab Results  Component Value Date   HDL 32.80* 06/15/2009   Lab Results  Component Value Date   LDLCALC 100* 06/15/2009   Lab Results  Component Value Date   TRIG 197.0* 06/15/2009   Lab Results  Component Value Date   CHOLHDL 5 06/15/2009     Assessment & Plan  Elevated LFTs Mild historically and resolved on recent labs. ALT 28, AST 22 on 05/08/12. Will check an acute hepatitis panel with next blood draw  HYPERLIPIDEMIA Looks good on lipid panel dated 05/08/12 Total cholesterol 152 TG 142 LDL86 HDL38 VLDL 28 Continue Simvastatin, avoid trans fasts, switch from fish oil to krill  oil  HYPERTENSION Tolerated Bystolic well, brings in bp log to review systolic ranged from 100 to 126 and diastolic ranged from 56-78. Only exception is when she did  not take her med and it was 141/102. Given more samples and a prescription today.  Anemia Resolved on labs HGB 13.3 and HCT 40.7 on 05/08/12  ANXIETY Doing well all things considered despite the recent death of her father

## 2012-05-13 NOTE — Assessment & Plan Note (Signed)
Looks good on lipid panel dated 05/08/12 Total cholesterol 152 TG 142 LDL86 HDL38 VLDL 28 Continue Simvastatin, avoid trans fasts, switch from fish oil to krill oil

## 2012-05-13 NOTE — Assessment & Plan Note (Signed)
Resolved on labs HGB 13.3 and HCT 40.7 on 05/08/12

## 2012-05-13 NOTE — Assessment & Plan Note (Signed)
Doing well all things considered despite the recent death of her father

## 2012-05-15 ENCOUNTER — Ambulatory Visit: Payer: 59 | Admitting: Family Medicine

## 2012-05-22 ENCOUNTER — Encounter: Payer: Self-pay | Admitting: Family Medicine

## 2012-06-25 ENCOUNTER — Other Ambulatory Visit: Payer: Self-pay

## 2012-06-25 DIAGNOSIS — I10 Essential (primary) hypertension: Secondary | ICD-10-CM

## 2012-06-25 MED ORDER — NEBIVOLOL HCL 10 MG PO TABS: 10.0000 mg | ORAL_TABLET | Freq: Every day | ORAL | Status: DC

## 2012-06-26 ENCOUNTER — Telehealth: Payer: Self-pay

## 2012-06-26 MED ORDER — PANTOPRAZOLE SODIUM 40 MG PO TBEC: 40.0000 mg | DELAYED_RELEASE_TABLET | Freq: Every day | ORAL | Status: DC

## 2012-06-26 NOTE — Telephone Encounter (Signed)
Nexium used to be free and is going to 25.00 a month. However Protonix will be available to patients with a co-pay of $0.00. [facility] is suggesting patient go to Protonix 40 mg 1 po qd with quantity 90.  I spoke to patient and she would like to make this change.  Please advise?

## 2012-06-26 NOTE — Telephone Encounter (Signed)
OK to change her to Protonix 40 mg po daily, disp 30 with 5 rf or 90 with 1 rf at her discretion

## 2012-06-26 NOTE — Telephone Encounter (Signed)
RX sent to pharmacy  

## 2012-06-29 ENCOUNTER — Other Ambulatory Visit: Payer: Self-pay

## 2012-06-29 DIAGNOSIS — I10 Essential (primary) hypertension: Secondary | ICD-10-CM

## 2012-06-29 MED ORDER — NEBIVOLOL HCL 10 MG PO TABS: 10.0000 mg | ORAL_TABLET | Freq: Every day | ORAL | Status: DC

## 2012-07-02 ENCOUNTER — Other Ambulatory Visit: Payer: Self-pay | Admitting: Internal Medicine

## 2012-07-06 ENCOUNTER — Other Ambulatory Visit: Payer: Self-pay | Admitting: Family Medicine

## 2012-07-24 ENCOUNTER — Other Ambulatory Visit: Payer: Self-pay

## 2012-07-24 MED ORDER — HYDRALAZINE HCL 25 MG PO TABS: 25.0000 mg | ORAL_TABLET | Freq: Two times a day (BID) | ORAL | Status: DC

## 2012-09-02 ENCOUNTER — Other Ambulatory Visit: Payer: 59

## 2012-09-02 ENCOUNTER — Other Ambulatory Visit (INDEPENDENT_AMBULATORY_CARE_PROVIDER_SITE_OTHER): Payer: 59

## 2012-09-02 DIAGNOSIS — E785 Hyperlipidemia, unspecified: Secondary | ICD-10-CM

## 2012-09-02 DIAGNOSIS — R7989 Other specified abnormal findings of blood chemistry: Secondary | ICD-10-CM

## 2012-09-02 DIAGNOSIS — I10 Essential (primary) hypertension: Secondary | ICD-10-CM

## 2012-09-02 LAB — HEPATIC FUNCTION PANEL
ALT: 28 U/L (ref 0–35)
AST: 25 U/L (ref 0–37)
Bilirubin, Direct: 0.1 mg/dL (ref 0.0–0.3)
Total Bilirubin: 0.6 mg/dL (ref 0.3–1.2)

## 2012-09-02 LAB — CBC
RBC: 4.55 Mil/uL (ref 3.87–5.11)
WBC: 6.3 10*3/uL (ref 4.5–10.5)

## 2012-09-02 LAB — RENAL FUNCTION PANEL
Albumin: 3.6 g/dL (ref 3.5–5.2)
Calcium: 9 mg/dL (ref 8.4–10.5)
Creatinine, Ser: 0.9 mg/dL (ref 0.4–1.2)
Glucose, Bld: 110 mg/dL — ABNORMAL HIGH (ref 70–99)
Sodium: 141 mEq/L (ref 135–145)

## 2012-09-02 LAB — LIPID PANEL
LDL Cholesterol: 94 mg/dL (ref 0–99)
Total CHOL/HDL Ratio: 5

## 2012-09-03 LAB — HEPATITIS PANEL, ACUTE
HCV Ab: NEGATIVE
Hep A IgM: NEGATIVE
Hep B C IgM: NEGATIVE
Hepatitis B Surface Ag: NEGATIVE

## 2012-09-04 NOTE — Progress Notes (Signed)
Quick Note:  Patient Informed and voiced understanding ______ 

## 2012-09-09 ENCOUNTER — Ambulatory Visit: Payer: 59 | Admitting: Family Medicine

## 2012-09-28 ENCOUNTER — Other Ambulatory Visit: Payer: Self-pay | Admitting: Family Medicine

## 2012-09-28 NOTE — Telephone Encounter (Signed)
Rx request to pharmacy; *PATIENT NEEDS OFFICE VISIT PRIOR TO FUTURE REFILLS*/SLS

## 2012-10-19 ENCOUNTER — Other Ambulatory Visit: Payer: Self-pay | Admitting: Family Medicine

## 2012-10-19 NOTE — Telephone Encounter (Signed)
Please advise...  Lexapro 20mg   Take 1 tablet by mouth daily #30 2rf

## 2012-11-02 ENCOUNTER — Other Ambulatory Visit: Payer: Self-pay | Admitting: Family Medicine

## 2012-11-10 ENCOUNTER — Ambulatory Visit (INDEPENDENT_AMBULATORY_CARE_PROVIDER_SITE_OTHER): Payer: 59 | Admitting: Family

## 2012-11-10 ENCOUNTER — Encounter: Payer: Self-pay | Admitting: Family

## 2012-11-10 VITALS — BP 146/96 | HR 58 | Temp 98.6°F | Resp 16 | Ht 66.0 in | Wt 310.1 lb

## 2012-11-10 DIAGNOSIS — R109 Unspecified abdominal pain: Secondary | ICD-10-CM

## 2012-11-10 DIAGNOSIS — M7918 Myalgia, other site: Secondary | ICD-10-CM | POA: Insufficient documentation

## 2012-11-10 LAB — POCT URINALYSIS DIPSTICK
Bilirubin, UA: NEGATIVE
Ketones, UA: NEGATIVE

## 2012-11-10 LAB — URINALYSIS, ROUTINE W REFLEX MICROSCOPIC
Bilirubin Urine: NEGATIVE
Glucose, UA: NEGATIVE mg/dL
Hgb urine dipstick: NEGATIVE
Protein, ur: NEGATIVE mg/dL
Urobilinogen, UA: 1 mg/dL (ref 0.0–1.0)

## 2012-11-10 MED ORDER — CYCLOBENZAPRINE HCL 5 MG PO TABS: 5.0000 mg | ORAL_TABLET | Freq: Every evening | ORAL | Status: DC | PRN

## 2012-11-10 NOTE — Patient Instructions (Addendum)
Back Pain, Adult Low back pain is very common. About 1 in 5 people have back pain.The cause of low back pain is rarely dangerous. The pain often gets better over time.About half of people with a sudden onset of back pain feel better in just 2 weeks. About 8 in 10 people feel better by 6 weeks.  CAUSES Some common causes of back pain include:  Strain of the muscles or ligaments supporting the spine.  Wear and tear (degeneration) of the spinal discs.  Arthritis.  Direct injury to the back. DIAGNOSIS Most of the time, the direct cause of low back pain is not known.However, back pain can be treated effectively even when the exact cause of the pain is unknown.Answering your caregiver's questions about your overall health and symptoms is one of the most accurate ways to make sure the cause of your pain is not dangerous. If your caregiver needs more information, he or she may order lab work or imaging tests (X-rays or MRIs).However, even if imaging tests show changes in your back, this usually does not require surgery. HOME CARE INSTRUCTIONS For many people, back pain returns.Since low back pain is rarely dangerous, it is often a condition that people can learn to manageon their own.   Remain active. It is stressful on the back to sit or stand in one place. Do not sit, drive, or stand in one place for more than 30 minutes at a time. Take short walks on level surfaces as soon as pain allows.Try to increase the length of time you walk each day.  Do not stay in bed.Resting more than 1 or 2 days can delay your recovery.  Do not avoid exercise or work.Your body is made to move.It is not dangerous to be active, even though your back may hurt.Your back will likely heal faster if you return to being active before your pain is gone.  Pay attention to your body when you bend and lift. Many people have less discomfortwhen lifting if they bend their knees, keep the load close to their bodies,and  avoid twisting. Often, the most comfortable positions are those that put less stress on your recovering back.  Find a comfortable position to sleep. Use a firm mattress and lie on your side with your knees slightly bent. If you lie on your back, put a pillow under your knees.  Only take over-the-counter or prescription medicines as directed by your caregiver. Over-the-counter medicines to reduce pain and inflammation are often the most helpful.Your caregiver may prescribe muscle relaxant drugs.These medicines help dull your pain so you can more quickly return to your normal activities and healthy exercise.  Put ice on the injured area.  Put ice in a plastic bag.  Place a towel between your skin and the bag.  Leave the ice on for 15 to 20 minutes, 3 to 4 times a day for the first 2 to 3 days. After that, ice and heat may be alternated to reduce pain and spasms.  Ask your caregiver about trying back exercises and gentle massage. This may be of some benefit.  Avoid feeling anxious or stressed.Stress increases muscle tension and can worsen back pain.It is important to recognize when you are anxious or stressed and learn ways to manage it.Exercise is a great option. SEEK MEDICAL CARE IF:  You have pain that is not relieved with rest or medicine.  You have pain that does not improve in 1 week.  You have new symptoms.  You are generally   not feeling well. SEEK IMMEDIATE MEDICAL CARE IF:   You have pain that radiates from your back into your legs.  You develop new bowel or bladder control problems.  You have unusual weakness or numbness in your arms or legs.  You develop nausea or vomiting.  You develop abdominal pain.  You feel faint. Document Released: 06/10/2005 Document Revised: 12/10/2011 Document Reviewed: 10/29/2010 Exit[employer] Patient Information 2013 [employer].  

## 2012-11-10 NOTE — Assessment & Plan Note (Addendum)
UA trace WBC and trace blood. No urinary symptoms. Reproducible low back pain, likely musculoskeletal. Will send urine for micro and culture to exclude infection.  Recommended that she continue diclofenac and add HS flexeril prn for short term.

## 2012-11-10 NOTE — Progress Notes (Signed)
Subjective:    [name], female    DOB: Jul 22, 1950, 62 y.o.   MRN: [id number]  HPI  [name] is a 62 yr old female who presents today with chief complaint of left sided flank pain.  She reports that pain has been present x 1 month. She has hx of renal cyst and renal calculus per chart review. Pain is intermittent.  Wakes her up from sleep at night.  During the day it is on/off.  Denies dysuria, frequency, hematuria. Notes some urgency at night with dribbling incontinence. Denies fever.   Leg pain- reports that she has had cramping in the right leg. Walking around helps briefly.  Did recently have right knee injections.     Review of Systems    see HPI  Past Medical History  Diagnosis Date  . GERD (gastroesophageal reflux disease)   . Anxiety   . Hyperlipidemia   . Hypertension   . Hx of colonic polyps   . Kidney stones   . Ventral hernia   . Chicken pox as a child  . Measles as a child  . Mumps as a child  . Obesity   . Vasomotor rhinitis 04/05/2012  . Sleep apnea 04/05/2012  . Anemia 04/05/2012  . Elevated LFTs 04/05/2012  . OA (osteoarthritis) of knee 04/05/2012    History   Social History  . Marital Status: Divorced    Spouse Name: N/A    Number of Children: N/A  . Years of Education: N/A   Occupational History  . Not on file.   Social History Main Topics  . Smoking status: Never Smoker   . Smokeless tobacco: Never Used  . Alcohol Use: No  . Drug Use: No  . Sexually Active: No   Other Topics Concern  . Not on file   Social History Narrative  . No narrative on file    Past Surgical History  Procedure Laterality Date  . Cholecystectomy    . Meniscus repair  2009  . Total knee arthroplasty  2011    left  . Knee arthroscopy  dec 2011    right  . Mouth surgery      teeth implants  . Hernia repair  02/08/11    ventral hernia  . Cesarean section      X 3  . Wisdom tooth extraction  2000    Family History  Problem  Relation Age of Onset  . Thyroid disease Mother   . Hyperlipidemia Mother   . Heart attack Father 18  . Hypertension Father   . Arthritis Father     RA  . Coronary artery disease Father   . Coronary artery disease Brother   . Cancer Maternal Aunt     colon  . Cancer Maternal Grandmother     colon  . Heart attack Maternal Grandfather   . Alcohol abuse Paternal Grandfather     No Known Allergies  Current Outpatient Prescriptions on File Prior to Visit  Medication Sig Dispense Refill  . ALPRAZolam (XANAX) 0.25 MG tablet Take 1 tablet (0.25 mg total) by mouth at bedtime as needed for sleep.  30 tablet  0  . aspirin 81 MG tablet Take 81 mg by mouth daily.        Marland Kitchen azelastine (ASTELIN) 137 MCG/SPRAY nasal spray Place 1 spray into the nose 2 (two) times daily. Use in each nostril as directed  30 mL  12  . BYSTOLIC 10 MG tablet TAKE 1 TABLET  BY MOUTH DAILY  30 tablet  1  . cetirizine (ZYRTEC) 10 MG tablet Take 10 mg by mouth daily.        . diclofenac (VOLTAREN) 75 MG EC tablet TAKE 1 TABLET BY MOUTH TWICE DAILY  180 tablet  1  . escitalopram (LEXAPRO) 20 MG tablet TAKE 1 TABLET BY MOUTH DAILY.  30 tablet  2  . hydrALAZINE (APRESOLINE) 25 MG tablet Take 1 tablet (25 mg total) by mouth 2 (two) times daily.  180 tablet  1  . losartan-hydrochlorothiazide (HYZAAR) 100-25 MG per tablet TAKE 1 TABLET BY MOUTH DAILY.  90 tablet  3  . pantoprazole (PROTONIX) 40 MG tablet Take 1 tablet (40 mg total) by mouth daily.  90 tablet  1  . ranitidine (ZANTAC) 300 MG tablet TAKE 1 TABLET BY MOUTH AT BEDTIME.  30 tablet  1  . simvastatin (ZOCOR) 40 MG tablet TAKE 1 TABLET BY MOUTH AT BEDTIME.  90 tablet  PRN   No current facility-administered medications on file prior to visit.    BP 146/96  Pulse 58  Temp(Src) 98.6 F (37 C) (Oral)  Resp 16  Ht 5\' 6"  (1.676 m)  Wt 310 lb 1.3 oz (140.651 kg)  BMI 50.07 kg/m2  SpO2 99%    Objective:   Physical Exam  Constitutional: She appears well-developed  and well-nourished. No distress.  Cardiovascular: Normal rate and regular rhythm.   No murmur heard. Pulmonary/Chest: Effort normal and breath sounds normal. No respiratory distress. She has no wheezes. She has no rales. She exhibits no tenderness.  Genitourinary:  Neg CVAT bilaterally  Musculoskeletal:  R lower back tender to palpation.   Bilateral LE strength is 5/5          Assessment & Plan:

## 2012-11-10 NOTE — Addendum Note (Signed)
Addended by: Mervin Kung A on: 11/10/2012 10:36 AM   Modules accepted: Orders

## 2012-12-03 ENCOUNTER — Other Ambulatory Visit: Payer: Self-pay | Admitting: Family Medicine

## 2012-12-07 ENCOUNTER — Other Ambulatory Visit: Payer: Self-pay | Admitting: Family Medicine

## 2012-12-07 ENCOUNTER — Telehealth: Payer: Self-pay | Admitting: Family Medicine

## 2012-12-07 MED ORDER — NEBIVOLOL HCL 10 MG PO TABS: 10.0000 mg | ORAL_TABLET | Freq: Every day | ORAL | Status: DC

## 2012-12-07 NOTE — Telephone Encounter (Signed)
BYSTOLIC 10 MG TABLET QTY 30 TAKE 1 TABLET BY MOUTH DAILY

## 2013-01-25 ENCOUNTER — Other Ambulatory Visit: Payer: Self-pay | Admitting: Family Medicine

## 2013-01-25 MED ORDER — HYDRALAZINE HCL 25 MG PO TABS: ORAL_TABLET | ORAL | Status: DC

## 2013-01-25 MED ORDER — ESCITALOPRAM OXALATE 20 MG PO TABS: ORAL_TABLET | ORAL | Status: DC

## 2013-01-25 NOTE — Telephone Encounter (Signed)
Rx request to pharmacy/[facility]  

## 2013-02-23 ENCOUNTER — Encounter: Payer: Self-pay | Admitting: Family Medicine

## 2013-02-23 ENCOUNTER — Ambulatory Visit (INDEPENDENT_AMBULATORY_CARE_PROVIDER_SITE_OTHER): Payer: 59 | Admitting: Family Medicine

## 2013-02-23 VITALS — BP 138/98 | HR 68 | Temp 98.4°F | Ht 66.0 in | Wt 316.0 lb

## 2013-02-23 DIAGNOSIS — F329 Major depressive disorder, single episode, unspecified: Secondary | ICD-10-CM

## 2013-02-23 DIAGNOSIS — M179 Osteoarthritis of knee, unspecified: Secondary | ICD-10-CM

## 2013-02-23 DIAGNOSIS — R52 Pain, unspecified: Secondary | ICD-10-CM

## 2013-02-23 DIAGNOSIS — M1710 Unilateral primary osteoarthritis, unspecified knee: Secondary | ICD-10-CM

## 2013-02-23 DIAGNOSIS — E785 Hyperlipidemia, unspecified: Secondary | ICD-10-CM

## 2013-02-23 DIAGNOSIS — F411 Generalized anxiety disorder: Secondary | ICD-10-CM

## 2013-02-23 DIAGNOSIS — Z5189 Encounter for other specified aftercare: Secondary | ICD-10-CM

## 2013-02-23 DIAGNOSIS — B354 Tinea corporis: Secondary | ICD-10-CM

## 2013-02-23 DIAGNOSIS — I10 Essential (primary) hypertension: Secondary | ICD-10-CM

## 2013-02-23 DIAGNOSIS — F3289 Other specified depressive episodes: Secondary | ICD-10-CM

## 2013-02-23 DIAGNOSIS — F32A Depression, unspecified: Secondary | ICD-10-CM

## 2013-02-23 DIAGNOSIS — G4700 Insomnia, unspecified: Secondary | ICD-10-CM

## 2013-02-23 DIAGNOSIS — R7989 Other specified abnormal findings of blood chemistry: Secondary | ICD-10-CM

## 2013-02-23 DIAGNOSIS — R10A0 Flank pain, unspecified side: Secondary | ICD-10-CM

## 2013-02-23 DIAGNOSIS — K219 Gastro-esophageal reflux disease without esophagitis: Secondary | ICD-10-CM

## 2013-02-23 DIAGNOSIS — IMO0002 Reserved for concepts with insufficient information to code with codable children: Secondary | ICD-10-CM

## 2013-02-23 DIAGNOSIS — T7840XD Allergy, unspecified, subsequent encounter: Secondary | ICD-10-CM

## 2013-02-23 DIAGNOSIS — D649 Anemia, unspecified: Secondary | ICD-10-CM

## 2013-02-23 DIAGNOSIS — R109 Unspecified abdominal pain: Secondary | ICD-10-CM

## 2013-02-23 HISTORY — DX: Tinea corporis: B35.4

## 2013-02-23 LAB — CBC
Hemoglobin: 14.1 g/dL (ref 12.0–15.0)
MCHC: 33.3 g/dL (ref 30.0–36.0)
Platelets: 309 10*3/uL (ref 150–400)
RBC: 4.83 MIL/uL (ref 3.87–5.11)

## 2013-02-23 LAB — RENAL FUNCTION PANEL
Albumin: 4.3 g/dL (ref 3.5–5.2)
Calcium: 10 mg/dL (ref 8.4–10.5)
Glucose, Bld: 87 mg/dL (ref 70–99)
Potassium: 3.9 mEq/L (ref 3.5–5.3)
Sodium: 139 mEq/L (ref 135–145)

## 2013-02-23 LAB — URIC ACID: Uric Acid, Serum: 6.2 mg/dL (ref 2.4–7.0)

## 2013-02-23 LAB — HEPATIC FUNCTION PANEL
AST: 19 U/L (ref 0–37)
Alkaline Phosphatase: 82 U/L (ref 39–117)
Bilirubin, Direct: 0.1 mg/dL (ref 0.0–0.3)
Total Bilirubin: 0.5 mg/dL (ref 0.3–1.2)

## 2013-02-23 LAB — LIPID PANEL
HDL: 39 mg/dL — ABNORMAL LOW (ref >39)
LDL Cholesterol: 100 mg/dL — ABNORMAL HIGH (ref 0–99)
Total CHOL/HDL Ratio: 4.5 Ratio

## 2013-02-23 MED ORDER — CYCLOBENZAPRINE HCL 5 MG PO TABS: 5.0000 mg | ORAL_TABLET | Freq: Every evening | ORAL | Status: DC | PRN

## 2013-02-23 MED ORDER — FLUCONAZOLE 150 MG PO TABS: 150.0000 mg | ORAL_TABLET | ORAL | Status: DC

## 2013-02-23 MED ORDER — CETIRIZINE HCL 10 MG PO TABS: 10.0000 mg | ORAL_TABLET | Freq: Every day | ORAL | Status: DC

## 2013-02-23 MED ORDER — CLOTRIMAZOLE-BETAMETHASONE 1-0.05 % EX CREA: TOPICAL_CREAM | Freq: Two times a day (BID) | CUTANEOUS | Status: DC

## 2013-02-23 MED ORDER — NEBIVOLOL HCL 10 MG PO TABS: 10.0000 mg | ORAL_TABLET | Freq: Every day | ORAL | Status: DC

## 2013-02-23 MED ORDER — RANITIDINE HCL 300 MG PO TABS: 300.0000 mg | ORAL_TABLET | Freq: Every day | ORAL | Status: DC

## 2013-02-23 MED ORDER — PANTOPRAZOLE SODIUM 40 MG PO TBEC: 40.0000 mg | DELAYED_RELEASE_TABLET | Freq: Every day | ORAL | Status: DC

## 2013-02-23 MED ORDER — DICLOFENAC SODIUM 75 MG PO TBEC: 75.0000 mg | DELAYED_RELEASE_TABLET | Freq: Two times a day (BID) | ORAL | Status: DC

## 2013-02-23 MED ORDER — ESCITALOPRAM OXALATE 20 MG PO TABS: ORAL_TABLET | ORAL | Status: DC

## 2013-02-23 MED ORDER — SIMVASTATIN 40 MG PO TABS: 40.0000 mg | ORAL_TABLET | Freq: Every day | ORAL | Status: DC

## 2013-02-23 MED ORDER — HYDRALAZINE HCL 25 MG PO TABS: ORAL_TABLET | ORAL | Status: DC

## 2013-02-23 MED ORDER — LOSARTAN POTASSIUM-HCTZ 100-25 MG PO TABS: 1.0000 | ORAL_TABLET | Freq: Every day | ORAL | Status: DC

## 2013-02-23 MED ORDER — ALPRAZOLAM 0.25 MG PO TABS: 0.2500 mg | ORAL_TABLET | Freq: Every evening | ORAL | Status: DC | PRN

## 2013-02-23 NOTE — Patient Instructions (Addendum)
Try Salon Pas patches or Cream for thumb or knee pain  Arthritis, Nonspecific Arthritis is pain, redness, warmth, or puffiness (inflammation) of a joint. The joint may be stiff or hurt when you move it. One or more joints may be affected. There are many types of arthritis. Your doctor may not know what type you have right away. The most common cause of arthritis is wear and tear on the joint (osteoarthritis). HOME CARE   Only take medicine as told by your doctor.  Rest the joint as much as possible.  Raise (elevate) your joint if it is puffy.  Use crutches if the painful joint is in your leg.  Drink enough fluids to keep your pee (urine) clear or pale yellow.  Follow your doctor's diet instructions.  Use cold packs for very bad joint pain for 10 to 15 minutes every hour. Ask your doctor if it is okay for you to use hot packs.  Exercise as told by your doctor.  Take a warm shower if you have stiffness in the morning.  Move your sore joints throughout the day. GET HELP RIGHT AWAY IF:   You have a fever.  You have very bad joint pain, puffiness, or redness.  You have many joints that are painful and puffy.  You are not getting better with treatment.  You have very bad back pain or leg weakness.  You cannot control when you poop (bowel movement) or pee (urinate).  You do not feel better in 24 hours or are getting worse.  You are having side effects from your medicine. MAKE SURE YOU:   Understand these instructions.  Will watch your condition.  Will get help right away if you are not doing well or get worse. Document Released: 09/04/2009 Document Revised: 12/10/2011 Document Reviewed: 09/04/2009 [facility] Patient Information 2014 [place], [place].

## 2013-02-23 NOTE — Assessment & Plan Note (Signed)
Well controlled on current meds, avoid offending foods and add a probiotic 

## 2013-02-23 NOTE — Assessment & Plan Note (Signed)
Check cbc 

## 2013-02-23 NOTE — Assessment & Plan Note (Signed)
Repeat lfts today 

## 2013-02-23 NOTE — Assessment & Plan Note (Signed)
Refill given on zocor and avoid trans fats, add a krill oil cap daily

## 2013-02-23 NOTE — Assessment & Plan Note (Signed)
Doing fairly well but living with her very difficult mother since her father died unexpectedly, continue Lexapro and given Alprazolam to use prn. Has used in past and tolerated well

## 2013-02-23 NOTE — Assessment & Plan Note (Addendum)
Mild elevation today and has been running up some, minimize sodium and caffeine and may need further meds in future, ahs appt with cardiology next week

## 2013-02-23 NOTE — Progress Notes (Signed)
[name], female   DOB: 14-Mar-1951, 62 y.o.   MRN: [id number] DHRUTI GHUMAN [id number] [birth year] 02/23/2013      Progress Note-Follow Up  Subjective  Chief Complaint  Chief Complaint  Patient presents with  . Follow-up    med check    HPI  Patient is a 62 year old Caucasian female who is in today for followup. She struggled with a great stress. Lives with her labile mother since her father died and acknowledges he is very stressful. Her mother yells frequently. She struggled with chronic pain. Has chronic knee pain but also chronic thumb pain sometimes sharp in others. Has myalgias and fatigue as well. Has recurrence every week itchy lesions sometimes on her abdomen. Right now and right lower quadrant. Has been present on her breasts and on her hips in the past. They tend to last several weeks then resolve. No chest pain, palpitations, shortness of breath, GI or GU concerns  Past Medical History  Diagnosis Date  . GERD (gastroesophageal reflux disease)   . Anxiety   . Hyperlipidemia   . Hypertension   . Hx of colonic polyps   . Kidney stones   . Ventral hernia   . Chicken pox as a child  . Measles as a child  . Mumps as a child  . Obesity   . Vasomotor rhinitis 04/05/2012  . Sleep apnea 04/05/2012  . Anemia 04/05/2012  . Elevated LFTs 04/05/2012  . OA (osteoarthritis) of knee 04/05/2012    Past Surgical History  Procedure Laterality Date  . Cholecystectomy    . Meniscus repair  2009  . Total knee arthroplasty  2011    left  . Knee arthroscopy  dec 2011    right  . Mouth surgery      teeth implants  . Hernia repair  02/08/11    ventral hernia  . Cesarean section      X 3  . Wisdom tooth extraction  2000    Family History  Problem Relation Age of Onset  . Thyroid disease Mother   . Hyperlipidemia Mother   . Heart attack Father 32  . Hypertension Father   . Arthritis Father     RA  . Coronary artery disease Father   . Coronary artery  disease Brother   . Cancer Maternal Aunt     colon  . Cancer Maternal Grandmother     colon  . Heart attack Maternal Grandfather   . Alcohol abuse Paternal Grandfather     History   Social History  . Marital Status: Divorced    Spouse Name: N/A    Number of Children: N/A  . Years of Education: N/A   Occupational History  . Not on file.   Social History Main Topics  . Smoking status: Never Smoker   . Smokeless tobacco: Never Used  . Alcohol Use: No  . Drug Use: No  . Sexual Activity: No   Other Topics Concern  . Not on file   Social History Narrative  . No narrative on file    Current Outpatient Prescriptions on File Prior to Visit  Medication Sig Dispense Refill  . aspirin 81 MG tablet Take 81 mg by mouth daily.         No current facility-administered medications on file prior to visit.    No Known Allergies  Review of Systems  Review of Systems  Constitutional: Negative for fever and malaise/fatigue.  HENT: Negative for congestion.   Eyes:  Negative for discharge.  Respiratory: Negative for shortness of breath.   Cardiovascular: Negative for chest pain, palpitations and leg swelling.  Gastrointestinal: Negative for nausea, abdominal pain and diarrhea.  Genitourinary: Negative for dysuria.  Musculoskeletal: Negative for falls.  Skin: Negative for rash.  Neurological: Negative for loss of consciousness and headaches.  Endo/Heme/Allergies: Negative for polydipsia.  Psychiatric/Behavioral: Negative for depression and suicidal ideas. The patient is not nervous/anxious and does not have insomnia.     Objective  BP 138/98  Pulse 68  Temp(Src) 98.4 F (36.9 C) (Oral)  Ht 5\' 6"  (1.676 m)  Wt 316 lb (143.337 kg)  BMI 51.03 kg/m2  SpO2 96%  Physical Exam  Physical Exam  Constitutional: She is oriented to person, place, and time and well-developed, well-nourished, and in no distress. No distress.  HENT:  Head: Normocephalic and atraumatic.  Eyes:  Conjunctivae are normal.  Neck: Neck supple. No thyromegaly present.  Cardiovascular: Normal rate, regular rhythm and normal heart sounds.   No murmur heard. Pulmonary/Chest: Effort normal and breath sounds normal. She has no wheezes.  Abdominal: She exhibits no distension and no mass.  Musculoskeletal: She exhibits no edema.  Lymphadenopathy:    She has no cervical adenopathy.  Neurological: She is alert and oriented to person, place, and time.  Skin: Skin is warm and dry. No rash noted. She is not diaphoretic.  Psychiatric: Memory, affect and judgment normal.    Lab Results  Component Value Date   TSH 0.83 09/02/2012   Lab Results  Component Value Date   WBC 6.3 09/02/2012   HGB 13.3 09/02/2012   HCT 39.9 09/02/2012   MCV 87.7 09/02/2012   PLT 237.0 09/02/2012   Lab Results  Component Value Date   CREATININE 0.9 09/02/2012   BUN 22 09/02/2012   NA 141 09/02/2012   K 4.0 09/02/2012   CL 104 09/02/2012   CO2 26 09/02/2012   Lab Results  Component Value Date   ALT 28 09/02/2012   AST 25 09/02/2012   ALKPHOS 75 09/02/2012   BILITOT 0.6 09/02/2012   Lab Results  Component Value Date   CHOL 159 09/02/2012   Lab Results  Component Value Date   HDL 32.00* 09/02/2012   Lab Results  Component Value Date   LDLCALC 94 09/02/2012   Lab Results  Component Value Date   TRIG 166.0* 09/02/2012   Lab Results  Component Value Date   CHOLHDL 5 09/02/2012     Assessment & Plan  GERD Well controlled on current meds, avoid offending foods and add a probiotic  HYPERLIPIDEMIA Refill given on zocor and avoid trans fats, add a krill oil cap daily  Elevated LFTs Repeat lfts today  Anemia Check cbc  ANXIETY Doing fairly well but living with her very difficult mother since her father died unexpectedly, continue Lexapro and given Alprazolam to use prn. Has used in past and tolerated well  HYPERTENSION Mild elevation today and has been running up some, minimize sodium and caffeine and  may need further meds in future, ahs appt with cardiology next week  OA (osteoarthritis) of knee Given refill on Voltaren, add Glucosamine and MegaRed and try Salon pas sparingly topically  Tinea corporis Diflucan weekly, Lotrisone bid add a probiotic

## 2013-02-23 NOTE — Assessment & Plan Note (Signed)
Given refill on Voltaren, add Glucosamine and MegaRed and try Salon pas sparingly topically

## 2013-02-23 NOTE — Assessment & Plan Note (Signed)
Diflucan weekly, Lotrisone bid add a probiotic

## 2013-02-24 NOTE — Progress Notes (Signed)
Quick Note:  Patient Informed and voiced understanding ______ 

## 2013-06-09 ENCOUNTER — Ambulatory Visit: Payer: 59 | Admitting: Physician Assistant

## 2013-07-25 LAB — HM PAP SMEAR: HM PAP: NORMAL

## 2013-07-25 LAB — HM MAMMOGRAPHY: HM MAMMO: NORMAL

## 2013-08-24 ENCOUNTER — Encounter: Payer: 59 | Admitting: Family Medicine

## 2013-09-02 ENCOUNTER — Encounter: Payer: Self-pay | Admitting: Family Medicine

## 2013-09-02 ENCOUNTER — Ambulatory Visit (INDEPENDENT_AMBULATORY_CARE_PROVIDER_SITE_OTHER): Payer: 59 | Admitting: Family Medicine

## 2013-09-02 ENCOUNTER — Telehealth: Payer: Self-pay | Admitting: Family Medicine

## 2013-09-02 VITALS — BP 138/100 | HR 65 | Temp 98.1°F | Ht 66.0 in | Wt 321.0 lb

## 2013-09-02 DIAGNOSIS — R945 Abnormal results of liver function studies: Secondary | ICD-10-CM

## 2013-09-02 DIAGNOSIS — R0789 Other chest pain: Secondary | ICD-10-CM

## 2013-09-02 DIAGNOSIS — E785 Hyperlipidemia, unspecified: Secondary | ICD-10-CM

## 2013-09-02 DIAGNOSIS — I10 Essential (primary) hypertension: Secondary | ICD-10-CM

## 2013-09-02 DIAGNOSIS — R011 Cardiac murmur, unspecified: Secondary | ICD-10-CM

## 2013-09-02 DIAGNOSIS — F411 Generalized anxiety disorder: Secondary | ICD-10-CM

## 2013-09-02 DIAGNOSIS — G4730 Sleep apnea, unspecified: Secondary | ICD-10-CM

## 2013-09-02 DIAGNOSIS — R7989 Other specified abnormal findings of blood chemistry: Secondary | ICD-10-CM

## 2013-09-02 DIAGNOSIS — D649 Anemia, unspecified: Secondary | ICD-10-CM

## 2013-09-02 DIAGNOSIS — Z0000 Encounter for general adult medical examination without abnormal findings: Secondary | ICD-10-CM

## 2013-09-02 DIAGNOSIS — K219 Gastro-esophageal reflux disease without esophagitis: Secondary | ICD-10-CM

## 2013-09-02 LAB — HEPATIC FUNCTION PANEL
ALT: 23 U/L (ref 0–35)
AST: 24 U/L (ref 0–37)
Albumin: 3.8 g/dL (ref 3.5–5.2)
Alkaline Phosphatase: 70 U/L (ref 39–117)
BILIRUBIN INDIRECT: 0.4 mg/dL (ref 0.2–1.2)
Bilirubin, Direct: 0.1 mg/dL (ref 0.0–0.3)
TOTAL PROTEIN: 6.7 g/dL (ref 6.0–8.3)
Total Bilirubin: 0.5 mg/dL (ref 0.2–1.2)

## 2013-09-02 LAB — RENAL FUNCTION PANEL
ALBUMIN: 3.8 g/dL (ref 3.5–5.2)
BUN: 15 mg/dL (ref 6–23)
CO2: 27 mEq/L (ref 19–32)
Calcium: 9 mg/dL (ref 8.4–10.5)
Chloride: 101 mEq/L (ref 96–112)
Creat: 0.73 mg/dL (ref 0.50–1.10)
GLUCOSE: 92 mg/dL (ref 70–99)
Phosphorus: 3.4 mg/dL (ref 2.3–4.6)
Potassium: 4.4 mEq/L (ref 3.5–5.3)
Sodium: 140 mEq/L (ref 135–145)

## 2013-09-02 LAB — CBC
HCT: 39 % (ref 36.0–46.0)
Hemoglobin: 13.1 g/dL (ref 12.0–15.0)
MCH: 29.2 pg (ref 26.0–34.0)
MCHC: 33.6 g/dL (ref 30.0–36.0)
MCV: 86.9 fL (ref 78.0–100.0)
PLATELETS: 262 10*3/uL (ref 150–400)
RBC: 4.49 MIL/uL (ref 3.87–5.11)
RDW: 14.5 % (ref 11.5–15.5)
WBC: 6.7 10*3/uL (ref 4.0–10.5)

## 2013-09-02 LAB — LIPID PANEL
CHOLESTEROL: 159 mg/dL (ref 0–200)
HDL: 34 mg/dL — ABNORMAL LOW (ref >39)
LDL Cholesterol: 98 mg/dL (ref 0–99)
Total CHOL/HDL Ratio: 4.7 Ratio
Triglycerides: 133 mg/dL (ref <150)
VLDL: 27 mg/dL (ref 0–40)

## 2013-09-02 NOTE — Telephone Encounter (Signed)
LAB ORDER WEEK OF 11-22-2013 LIVER RENAL CBC TSH LIPID

## 2013-09-02 NOTE — Progress Notes (Signed)
Pre visit review using our clinic review tool, if applicable. No additional management support is needed unless otherwise documented below in the visit note. 

## 2013-09-02 NOTE — Progress Notes (Signed)
[name], female   DOB: 27-Jul-1950, 63 y.o.   MRN: [id number] [name] [id number] [birth year] 09/02/2013      Progress Note-Follow Up  Subjective  Chief Complaint  Chief Complaint  Patient presents with  . Annual Exam    physical    HPI  Patient is a 63 year old female in today for routine medical care. Is recovering from a sinus infection. Has some persistent congestion but no f/c/cough/sob. Has been struggling with increased palpitations and reports being told in past that she had a widened qrs complex. Also noting increased indigestion with dyspepsia, epigastric and back pain. Feels most of her sob is related to congestion. Denies CP/GI or GU c/o. Taking meds as prescribed  Past Medical History  Diagnosis Date  . GERD (gastroesophageal reflux disease)   . Anxiety   . Hyperlipidemia   . Hypertension   . Hx of colonic polyps   . Kidney stones   . Ventral hernia   . Chicken pox as a child  . Measles as a child  . Mumps as a child  . Obesity   . Vasomotor rhinitis 04/05/2012  . Sleep apnea 04/05/2012  . Anemia 04/05/2012  . Elevated LFTs 04/05/2012  . OA (osteoarthritis) of knee 04/05/2012  . Tinea corporis 02/23/2013    Past Surgical History  Procedure Laterality Date  . Cholecystectomy    . Meniscus repair  2009  . Total knee arthroplasty  2011    left  . Knee arthroscopy  dec 2011    right  . Mouth surgery      teeth implants  . Hernia repair  02/08/11    ventral hernia  . Cesarean section      X 3  . Wisdom tooth extraction  2000    Family History  Problem Relation Age of Onset  . Thyroid disease Mother   . Hyperlipidemia Mother   . Heart attack Father 44  . Hypertension Father   . Arthritis Father     RA  . Coronary artery disease Father   . Coronary artery disease Brother   . Cancer Maternal Aunt     colon  . Cancer Maternal Grandmother     colon  . Heart attack Maternal Grandfather   . Alcohol abuse Paternal  Grandfather     History   Social History  . Marital Status: Divorced    Spouse Name: N/A    Number of Children: N/A  . Years of Education: N/A   Occupational History  . Not on file.   Social History Main Topics  . Smoking status: Never Smoker   . Smokeless tobacco: Never Used  . Alcohol Use: No  . Drug Use: No  . Sexual Activity: No   Other Topics Concern  . Not on file   Social History Narrative  . No narrative on file    Current Outpatient Prescriptions on File Prior to Visit  Medication Sig Dispense Refill  . ALPRAZolam (XANAX) 0.25 MG tablet Take 1 tablet (0.25 mg total) by mouth at bedtime as needed for sleep or anxiety.  60 tablet  1  . aspirin 81 MG tablet Take 81 mg by mouth daily.        . cetirizine (ZYRTEC) 10 MG tablet Take 1 tablet (10 mg total) by mouth daily.  90 tablet  3  . clotrimazole-betamethasone (LOTRISONE) cream Apply topically 2 (two) times daily.  45 g  2  . cyclobenzaprine (FLEXERIL) 5 MG  tablet Take 1 tablet (5 mg total) by mouth at bedtime as needed for muscle spasms.  30 tablet  5  . diclofenac (VOLTAREN) 75 MG EC tablet Take 1 tablet (75 mg total) by mouth 2 (two) times daily.  180 tablet  1  . escitalopram (LEXAPRO) 20 MG tablet TAKE 1 TABLET BY MOUTH DAILY.  90 tablet  3  . hydrALAZINE (APRESOLINE) 25 MG tablet TAKE 1 TABLET BY MOUTH 2 TIMES DAILY.  180 tablet  3  . losartan-hydrochlorothiazide (HYZAAR) 100-25 MG per tablet Take 1 tablet by mouth daily.  90 tablet  3  . nebivolol (BYSTOLIC) 10 MG tablet Take 1 tablet (10 mg total) by mouth daily.  90 tablet  3  . pantoprazole (PROTONIX) 40 MG tablet Take 1 tablet (40 mg total) by mouth daily.  90 tablet  3  . ranitidine (ZANTAC) 300 MG tablet Take 1 tablet (300 mg total) by mouth at bedtime.  90 tablet  3  . simvastatin (ZOCOR) 40 MG tablet Take 1 tablet (40 mg total) by mouth at bedtime.  90 tablet  3   No current facility-administered medications on file prior to visit.    No Known  Allergies  Review of Systems  Review of Systems  Constitutional: Negative for fever and malaise/fatigue.  HENT: Positive for congestion.   Eyes: Negative for discharge.  Respiratory: Positive for shortness of breath.   Cardiovascular: Positive for chest pain and palpitations. Negative for leg swelling.  Gastrointestinal: Negative for nausea, abdominal pain and diarrhea.  Genitourinary: Negative for dysuria.  Musculoskeletal: Negative for falls.  Skin: Negative for rash.  Neurological: Positive for headaches. Negative for loss of consciousness.  Endo/Heme/Allergies: Negative for polydipsia.  Psychiatric/Behavioral: Negative for depression and suicidal ideas. The patient is not nervous/anxious and does not have insomnia.     Objective  BP 138/100  Pulse 65  Temp(Src) 98.1 F (36.7 C) (Oral)  Ht 5\' 6"  (1.676 m)  Wt 321 lb (145.605 kg)  BMI 51.84 kg/m2  SpO2 96%  Physical Exam  Physical Exam  Constitutional: She is oriented to person, place, and time and well-developed, well-nourished, and in no distress. No distress.  HENT:  Head: Normocephalic and atraumatic.  Eyes: Conjunctivae are normal.  Neck: Neck supple. No thyromegaly present.  Cardiovascular: Normal rate, regular rhythm and normal heart sounds.   No murmur heard. Pulmonary/Chest: Effort normal and breath sounds normal. She has no wheezes.  Abdominal: She exhibits no distension and no mass.  Musculoskeletal: She exhibits no edema.  Lymphadenopathy:    She has no cervical adenopathy.  Neurological: She is alert and oriented to person, place, and time.  Skin: Skin is warm and dry. No rash noted. She is not diaphoretic.  Psychiatric: Memory, affect and judgment normal.    Lab Results  Component Value Date   TSH 0.864 02/23/2013   Lab Results  Component Value Date   WBC 9.0 02/23/2013   HGB 14.1 02/23/2013   HCT 42.4 02/23/2013   MCV 87.8 02/23/2013   PLT 309 02/23/2013   Lab Results  Component Value Date    CREATININE 0.89 02/23/2013   BUN 18 02/23/2013   NA 139 02/23/2013   K 3.9 02/23/2013   CL 100 02/23/2013   CO2 29 02/23/2013   Lab Results  Component Value Date   ALT 22 02/23/2013   AST 19 02/23/2013   ALKPHOS 82 02/23/2013   BILITOT 0.5 02/23/2013   Lab Results  Component Value Date   CHOL  177 02/23/2013   Lab Results  Component Value Date   HDL 39* 02/23/2013   Lab Results  Component Value Date   LDLCALC 100* 02/23/2013   Lab Results  Component Value Date   TRIG 191* 02/23/2013   Lab Results  Component Value Date   CHOLHDL 4.5 02/23/2013     Assessment & Plan  Preventative health care Patient encouraged to maintain heart healthy diet, regular exercise, adequate sleep. Consider daily probiotics. Take medications as prescribed  Undiagnosed cardiac murmurs Check 2 D echo.  Noting increased palpitations as well. Will refer to cardiology for further consideration. No palpitations at visit. Reports h/o wide QRS but computers down during her visit.   HYPERTENSION Poorly controlled will continue current medications, encouraged DASH diet, minimize caffeine and obtain adequate sleep. Report concerning symptoms and follow up as directed and as needed.   HYPERLIPIDEMIA Tolerating statin, encouraged heart healthy diet, avoid trans fats, minimize simple carbs and saturated fats. Increase exercise as tolerated  Sleep apnea Currently not using CPAP referred to pulmonology for further treatment  Anemia resolved  GERD Avoid offending foods, start probiotics. Do not eat large meals in late evening and consider raising head of bed. Continue PPI  ANXIETY Well treated with Lexapro, continue the same

## 2013-09-03 ENCOUNTER — Telehealth: Payer: Self-pay | Admitting: Family Medicine

## 2013-09-03 LAB — TSH: TSH: 0.997 u[IU]/mL (ref 0.350–4.500)

## 2013-09-03 NOTE — Telephone Encounter (Signed)
Relevant patient education assigned to patient using Emmi. ° °

## 2013-09-05 ENCOUNTER — Encounter: Payer: Self-pay | Admitting: Family Medicine

## 2013-09-05 DIAGNOSIS — R011 Cardiac murmur, unspecified: Secondary | ICD-10-CM | POA: Insufficient documentation

## 2013-09-05 DIAGNOSIS — Z0000 Encounter for general adult medical examination without abnormal findings: Secondary | ICD-10-CM

## 2013-09-05 HISTORY — DX: Encounter for general adult medical examination without abnormal findings: Z00.00

## 2013-09-05 NOTE — Assessment & Plan Note (Signed)
resolved 

## 2013-09-05 NOTE — Assessment & Plan Note (Signed)
Poorly controlled will continue current medications, encouraged DASH diet, minimize caffeine and obtain adequate sleep. Report concerning symptoms and follow up as directed and as needed.

## 2013-09-05 NOTE — Assessment & Plan Note (Signed)
Avoid offending foods, start probiotics. Do not eat large meals in late evening and consider raising head of bed. Continue PPI

## 2013-09-05 NOTE — Assessment & Plan Note (Signed)
Currently not using CPAP referred to pulmonology for further treatment

## 2013-09-05 NOTE — Assessment & Plan Note (Addendum)
Check 2 D echo.  Noting increased palpitations as well. Will refer to cardiology for further consideration. No palpitations at visit. Reports h/o wide QRS but computers down during her visit.

## 2013-09-05 NOTE — Assessment & Plan Note (Signed)
Tolerating statin, encouraged heart healthy diet, avoid trans fats, minimize simple carbs and saturated fats. Increase exercise as tolerated 

## 2013-09-05 NOTE — Assessment & Plan Note (Signed)
Well treated with Lexapro, continue the same

## 2013-09-05 NOTE — Assessment & Plan Note (Signed)
Patient encouraged to maintain heart healthy diet, regular exercise, adequate sleep. Consider daily probiotics. Take medications as prescribed 

## 2013-09-08 ENCOUNTER — Ambulatory Visit (HOSPITAL_BASED_OUTPATIENT_CLINIC_OR_DEPARTMENT_OTHER)
Admission: RE | Admit: 2013-09-08 | Discharge: 2013-09-08 | Disposition: A | Discharge status: Home / Self Care | Payer: 59 | Source: Ambulatory Visit | Attending: Family Medicine | Admitting: Family Medicine

## 2013-09-08 DIAGNOSIS — Z6841 Body Mass Index (BMI) 40.0 and over, adult: Secondary | ICD-10-CM | POA: Insufficient documentation

## 2013-09-08 DIAGNOSIS — I517 Cardiomegaly: Secondary | ICD-10-CM

## 2013-09-08 DIAGNOSIS — I10 Essential (primary) hypertension: Secondary | ICD-10-CM | POA: Insufficient documentation

## 2013-09-08 DIAGNOSIS — R011 Cardiac murmur, unspecified: Secondary | ICD-10-CM | POA: Insufficient documentation

## 2013-09-08 NOTE — Progress Notes (Signed)
Echocardiogram 2D Echocardiogram has been performed.  [name] 09/08/2013, 9:35 AM

## 2013-09-20 ENCOUNTER — Encounter: Payer: Self-pay | Admitting: Cardiology

## 2013-09-20 ENCOUNTER — Ambulatory Visit (INDEPENDENT_AMBULATORY_CARE_PROVIDER_SITE_OTHER): Payer: 59 | Admitting: Cardiology

## 2013-09-20 VITALS — BP 142/96 | HR 61 | Ht 66.0 in | Wt 317.0 lb

## 2013-09-20 DIAGNOSIS — I10 Essential (primary) hypertension: Secondary | ICD-10-CM

## 2013-09-20 DIAGNOSIS — R079 Chest pain, unspecified: Secondary | ICD-10-CM

## 2013-09-20 DIAGNOSIS — R002 Palpitations: Secondary | ICD-10-CM

## 2013-09-20 DIAGNOSIS — E785 Hyperlipidemia, unspecified: Secondary | ICD-10-CM

## 2013-09-20 NOTE — Patient Instructions (Signed)
Your physician recommends that you continue on your current medications as directed. Please refer to the Current Medication list given to you today.  Your physician has recommended that you wear an event monitor. Event monitors are medical devices that record the heart's electrical activity. Doctors most often [name] these monitors to diagnose arrhythmias. Arrhythmias are problems with the speed or rhythm of the heartbeat. The monitor is a small, portable device. You can wear one while you do your normal daily activities. This is usually used to diagnose what is causing palpitations/syncope (passing out).   Your physician has requested that you have a lexiscan myoview. For further information please visit [email or web address]. Please follow instruction sheet, as given.  Your physician wants you to follow-up in: 1 year with Dr Mallie Snooks will receive a reminder letter in the mail two months in advance. If you don't receive a letter, please call our office to schedule the follow-up appointment.

## 2013-09-20 NOTE — Progress Notes (Signed)
[place], [facility], [place]  [address] Phone: [phone] Fax:  [phone]  Date:  09/20/2013   ID:  [name], DOB 12-Jun-1951, MRN [id number]  PCP:  Penni Homans, MD  Cardiologist:  Fransico Him, MD     History of Present Illness: [name] is a 63 y.o. female with a history of HTN and dyslipidemia who presents today for evaluation of palpitations.  She says that she has some heart palpitations at times but recently had an episode of prolonged palpitations that was different in that it was a continous quivering in her heart while lying in bed. She says that at time she feels pressure in her chest that usually occurs when she is at work.  There is no radiation of the pressure except occasionally to her back.  She says that she is occasionally feels SOB but it mainly is a feeling that she cannot breathe through her nose and cannot get enough air breathing through her mouth.  She has a history of abnormal nuclear stress test in the past with cath in 2010 showing 20% mid LAD lesion.   She denies any dizziness or syncope.  She denies any LE edema.  She says that the palpitations occur when she is resting.  Both her dad and brother have a history of CAD.  She has been under a lot of stress. Her Dad was killed and she had to move in with her mom which has caused her a lot of stress.   Wt Readings from Last 3 Encounters:  09/20/13 317 lb (143.79 kg)  09/02/13 321 lb (145.605 kg)  02/23/13 316 lb (143.337 kg)     Past Medical History  Diagnosis Date  . GERD (gastroesophageal reflux disease)   . Anxiety   . Hyperlipidemia   . Hypertension   . Hx of colonic polyps   . Kidney stones   . Ventral hernia   . Chicken pox as a child  . Measles as a child  . Mumps as a child  . Obesity   . Vasomotor rhinitis 04/05/2012  . Sleep apnea 04/05/2012  . Anemia 04/05/2012  . Elevated LFTs 04/05/2012  . OA (osteoarthritis) of knee 04/05/2012  . Tinea corporis 02/23/2013    . Preventative health care 09/05/2013    Current Outpatient Prescriptions  Medication Sig Dispense Refill  . ALPRAZolam (XANAX) 0.25 MG tablet Take 1 tablet (0.25 mg total) by mouth at bedtime as needed for sleep or anxiety.  60 tablet  1  . aspirin 81 MG tablet Take 81 mg by mouth daily.        . cetirizine (ZYRTEC) 10 MG tablet Take 1 tablet (10 mg total) by mouth daily.  90 tablet  3  . clotrimazole-betamethasone (LOTRISONE) cream Apply topically 2 (two) times daily.  45 g  2  . diclofenac (VOLTAREN) 75 MG EC tablet Take 1 tablet (75 mg total) by mouth 2 (two) times daily.  180 tablet  1  . escitalopram (LEXAPRO) 20 MG tablet TAKE 1 TABLET BY MOUTH DAILY.  90 tablet  3  . fluticasone (FLONASE) 50 MCG/ACT nasal spray Place into both nostrils daily.      . hydrALAZINE (APRESOLINE) 25 MG tablet TAKE 1 TABLET BY MOUTH 2 TIMES DAILY.  180 tablet  3  . losartan-hydrochlorothiazide (HYZAAR) 100-25 MG per tablet Take 1 tablet by mouth daily.  90 tablet  3  . nebivolol (BYSTOLIC) 10 MG tablet Take 1 tablet (10 mg  total) by mouth daily.  90 tablet  3  . pantoprazole (PROTONIX) 40 MG tablet Take 1 tablet (40 mg total) by mouth daily.  90 tablet  3  . ranitidine (ZANTAC) 300 MG tablet Take 1 tablet (300 mg total) by mouth at bedtime.  90 tablet  3  . simvastatin (ZOCOR) 40 MG tablet Take 1 tablet (40 mg total) by mouth at bedtime.  90 tablet  3   No current facility-administered medications for this visit.    Allergies:   No Known Allergies  Social History:  The patient  reports that she has never smoked. She has never used smokeless tobacco. She reports that she does not drink alcohol or use illicit drugs.   Family History:  The patient's family history includes Alcohol abuse in her paternal grandfather; Arthritis in her father; Cancer in her maternal aunt and maternal grandmother; Coronary artery disease in her brother and father; Heart attack in her maternal grandfather; Heart attack (age of  onset: 75) in her father; Heart disease in her brother; Hyperlipidemia in her mother; Hypertension in her father; Thyroid disease in her mother.   ROS:  Please see the history of present illness.      All other systems reviewed and negative.   PHYSICAL EXAM: VS:  BP 142/96  Pulse 61  Ht 5\' 6"  (1.676 m)  Wt 317 lb (143.79 kg)  BMI 51.19 kg/m2 Well nourished, well developed, in no acute distress HEENT: normal Neck: no JVD Cardiac:  normal S1, S2; RRR; no murmur Lungs:  clear to auscultation bilaterally, no wheezing, rhonchi or rales Abd: soft, nontender, no hepatomegaly Ext: no edema Skin: warm and dry Neuro:  CNs 2-12 intact, no focal abnormalities noted  EKG:  NSR at 61bpm with incomplete RBBB and LAFB  - unchanged form 2012  ASSESSMENT AND PLAN:  1. Atypical chest pain with a strong family history of ASCAD.  She had nonobstructive disease at the time of her cath in 2010.   - 2 day Lexiscan myoview 2. HTN mildly elevated but she has white coat syndrome and is normal at home - continue Hydralazine/Hyzar/bystolic 3. Palpitations - event monitor to assess for arrhythmia 4. Dyslipidemia - continue statin 5. Nonobstructive ASCAD by cath 2010 - continue ASA  Followup with me in 1 year  Signed, Fransico Him, MD 09/20/2013 9:29 AM

## 2013-09-21 ENCOUNTER — Encounter: Payer: Self-pay | Admitting: *Deleted

## 2013-09-21 ENCOUNTER — Encounter (INDEPENDENT_AMBULATORY_CARE_PROVIDER_SITE_OTHER): Payer: 59

## 2013-09-21 DIAGNOSIS — R002 Palpitations: Secondary | ICD-10-CM

## 2013-09-21 NOTE — Progress Notes (Signed)
[name], female   DOB: Nov 16, 1950, 63 y.o.   MRN: [id number] [employer] 30 day cardiac event monitor applied to patient.

## 2013-10-05 ENCOUNTER — Ambulatory Visit (HOSPITAL_COMMUNITY): Payer: 59 | Attending: Cardiology | Admitting: Radiology

## 2013-10-05 VITALS — BP 128/74 | HR 57 | Ht 66.0 in | Wt 316.0 lb

## 2013-10-05 DIAGNOSIS — R0789 Other chest pain: Secondary | ICD-10-CM | POA: Insufficient documentation

## 2013-10-05 DIAGNOSIS — Z8249 Family history of ischemic heart disease and other diseases of the circulatory system: Secondary | ICD-10-CM | POA: Insufficient documentation

## 2013-10-05 DIAGNOSIS — R079 Chest pain, unspecified: Secondary | ICD-10-CM

## 2013-10-05 DIAGNOSIS — R0602 Shortness of breath: Secondary | ICD-10-CM | POA: Insufficient documentation

## 2013-10-05 DIAGNOSIS — R002 Palpitations: Secondary | ICD-10-CM | POA: Insufficient documentation

## 2013-10-05 DIAGNOSIS — I10 Essential (primary) hypertension: Secondary | ICD-10-CM | POA: Insufficient documentation

## 2013-10-05 DIAGNOSIS — E785 Hyperlipidemia, unspecified: Secondary | ICD-10-CM | POA: Insufficient documentation

## 2013-10-05 MED ORDER — REGADENOSON 0.4 MG/5ML IV SOLN
0.4000 mg | Freq: Once | INTRAVENOUS | Status: AC
  Administered 2013-10-05: 0.4 mg via INTRAVENOUS

## 2013-10-05 MED ORDER — TECHNETIUM TC 99M SESTAMIBI GENERIC - CARDIOLITE
30.0000 | Freq: Once | INTRAVENOUS | Status: AC | PRN
  Administered 2013-10-05: 30 via INTRAVENOUS

## 2013-10-05 NOTE — Progress Notes (Signed)
[name] [address] [place], [place] [address] [phone]    Cardiology Nuclear Med Study  [name] is a 63 y.o. female     MRN : [id number]     DOB: July 08, 1950  Procedure Date: 10/05/2013  Nuclear Med Background Indication for Stress Test:  Evaluation for Ischemia History:  CAD (n/o dz), Cath 2012, Echo 2014 EF 55-60%, MPI 2009 (ischemia) EF 68% Cardiac Risk Factors: Family History - CAD, Hypertension and Lipids  Symptoms:  Chest Pressure.  (last date of chest discomfort was last night), Palpitations and SOB   Nuclear Pre-Procedure Caffeine/Decaff Intake:  None > 12 hrs NPO After: 7:00pm   Lungs:  clear O2 Sat: 96% on room air. IV 0.9% NS with Angio Cath:  22g  IV Site: R Antecubital x 1, tolerated well IV Started by:  [name] Baltimore, RN  Chest Size (in):  44 Cup Size: DD  Height: 5\' 6"  (1.676 m)  Weight:  316 lb (143.337 kg)  BMI:  Body mass index is 51.03 kg/(m^2). 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    Nuclear Med Study 1 or 2 day study: 2 day  Stress Test Type:  Lexiscan  Reading MD: N/A  Order Authorizing Provider:  Fransico Him, MD  Resting Radionuclide: Technetium 66m Sestamibi  Resting Radionuclide Dose: 33.0 mCi   On     10-11-13  Stress Radionuclide:  Technetium 72m Sestamibi  Stress Radionuclide Dose: 33.0 mCi   On       10-05-13          Stress Protocol Rest HR: 57 Stress HR: 76  Rest BP: 128/74 Stress BP: 133/101  Exercise Time (min): n/a METS: n/a           Dose of Adenosine (mg):  n/a Dose of Lexiscan: 0.4 mg  Dose of Atropine (mg): n/a Dose of Dobutamine: n/a mcg/kg/min (at max HR)  Stress Test Technologist: [name], BS-ES  Nuclear Technologist:  [name], CNMT     Rest Procedure:  Myocardial perfusion imaging was performed at rest 45 minutes following the intravenous administration of Technetium 2m Sestamibi. Rest ECG: SB 57 bpm   LAFB.  Anteroseptal MI  Poor R wave  progression.  Stress Procedure:  The patient received IV Lexiscan 0.4 mg over 15-seconds.  Technetium 35m Sestamibi injected at 30-seconds.  Quantitative spect images were obtained after a 45 minute delay.  During the infusion of Lexiscan, the patient complained of slight SOB and chest pressure.  These symptoms resolved in recovery.  Stress ECG: No significant change from baseline ECG  QPS Raw Data Images:  Soft tissue (diaphragm, breast, subcutaneous fat) surround heart. Stress Images: Small defect in the distal anteroseptal and apical wall.  Otherwise normal perfusion.   Rest Images:  Normalization of apical defect.   Subtraction (SDS):  No evidence of ischemia. Transient Ischemic Dilatation (Normal <1.22):  1.24 Lung/Heart Ratio (Normal <0.45):  0.44  Quantitative Gated Spect Images QGS EDV:  168 ml QGS ESV:  81 ml  Impression Exercise Capacity:  Lexiscan with no exercise. BP Response:  Normal blood pressure response. Clinical Symptoms:  Mild chest pain/dyspnea. ECG Impression:  No significant ST segment change suggestive of ischemia. Comparison with Prior Nuclear Study:Scan from 2009 reported to show ischemia.   Overall Impression:  Small defect in very distal anteroseptal and apical wall that improves at rest.  Otherwise normal perfusion  May represent some shifting soft tissue (breast), cannot exclude trivial ischemia.  Overall low risk scan.  LV Ejection Fraction: 52%.  LV Wall Motion: Basal inferior/inferolateral hypokinesis.   [name]

## 2013-10-06 ENCOUNTER — Ambulatory Visit (INDEPENDENT_AMBULATORY_CARE_PROVIDER_SITE_OTHER): Payer: 59 | Admitting: Pulmonary Disease

## 2013-10-06 ENCOUNTER — Encounter: Payer: Self-pay | Admitting: Pulmonary Disease

## 2013-10-06 VITALS — BP 128/92 | HR 63 | Ht 66.0 in | Wt 320.0 lb

## 2013-10-06 DIAGNOSIS — G4733 Obstructive sleep apnea (adult) (pediatric): Secondary | ICD-10-CM

## 2013-10-06 NOTE — Progress Notes (Signed)
Chief Complaint  Patient presents with  . Sleep Consult    referred by Dr. Charlett Blake for OSA    History of Present Illness: [name] is a 63 y.o. female for evaluation of sleep problems.  She was diagnosed with sleep apnea in 2005.  She was last on CPAP in 2010.  She had a full face mask.  Her main issues was with noise from the machine.  She continues to have trouble with her sleep.  This has gotten worse since she had to move in with her mother after her father was killed in an accident.  She has been under a lot of stress from this.  She has also noticed palpitations recently and is being evaluated by cardiology for this.  She goes to sleep at 10 pm.  She falls asleep after about an hour.  She wakes up 2 to 3 times to use the bathroom.  She gets out of bed at 730 am.  She feels tired in the morning.  She gets morning headache.  She does not use anything to help her fall sleep or stay awake.  She does snore at night, and wakes up feeling like she can't breath.  She has been told she stops breathing while asleep.  She has fallen asleep at stop signs, and when she is watching TV.  She denies sleep walking, sleep talking, bruxism, or nightmares.  There is no history of restless legs.  She denies sleep hallucinations, sleep paralysis, or cataplexy.  The Epworth score is 17 out of 24.  Tests: PSG 06/03/04 >> AHI 40, SaO2 87% Echo 09/08/13 >> mild LVH, EF 55 to 26%, grade 2 diastolic dysfx    [name]  has a past medical history of GERD (gastroesophageal reflux disease); Anxiety; Hyperlipidemia; Hypertension; colonic polyps; Kidney stones; Ventral hernia; Chicken pox (as a child); Measles (as a child); Mumps (as a child); Obesity; Vasomotor rhinitis (04/05/2012); Sleep apnea (04/05/2012); Anemia (04/05/2012); Elevated LFTs (04/05/2012); OA (osteoarthritis) of knee (04/05/2012); Tinea corporis (02/23/2013); and Preventative health care (09/05/2013).  [name]  has past  surgical history that includes Cholecystectomy; Meniscus repair (2009); Total knee arthroplasty (2011); Knee arthroscopy (dec 2011); Mouth surgery; Hernia repair (02/08/11); Cesarean section; Wisdom tooth extraction (2000); and Cardiac catheterization.  Prior to Admission medications   Medication Sig Start Date End Date Taking? Authorizing Provider  ALPRAZolam (XANAX) 0.25 MG tablet Take 1 tablet (0.25 mg total) by mouth at bedtime as needed for sleep or anxiety. 02/23/13  Yes [name], MD  aspirin 81 MG tablet Take 81 mg by mouth daily.     Yes Historical Provider, MD  cetirizine (ZYRTEC) 10 MG tablet Take 1 tablet (10 mg total) by mouth daily. 02/23/13  Yes [name], MD  clotrimazole-betamethasone (LOTRISONE) cream Apply topically 2 (two) times daily. 02/23/13  Yes [name], MD  diclofenac (VOLTAREN) 75 MG EC tablet Take 1 tablet (75 mg total) by mouth 2 (two) times daily. 02/23/13  Yes [name], MD  escitalopram (LEXAPRO) 20 MG tablet TAKE 1 TABLET BY MOUTH DAILY. 02/23/13  Yes [name], MD  fluticasone (FLONASE) 50 MCG/ACT nasal spray Place into both nostrils daily.   Yes Historical Provider, MD  hydrALAZINE (APRESOLINE) 25 MG tablet TAKE 1 TABLET BY MOUTH 2 TIMES DAILY. 02/23/13  Yes [name], MD  losartan-hydrochlorothiazide (HYZAAR) 100-25 MG per tablet Take 1 tablet by mouth daily. 02/23/13  Yes [name], MD  nebivolol (  BYSTOLIC) 10 MG tablet Take 1 tablet (10 mg total) by mouth daily. 02/23/13  Yes [name], MD  pantoprazole (PROTONIX) 40 MG tablet Take 1 tablet (40 mg total) by mouth daily. 02/23/13  Yes [name], MD  ranitidine (ZANTAC) 300 MG tablet Take 1 tablet (300 mg total) by mouth at bedtime. 02/23/13  Yes [name], MD  simvastatin (ZOCOR) 40 MG tablet Take 1 tablet (40 mg total) by mouth at bedtime. 02/23/13  Yes [name], MD    No Known Allergies  Her family history includes Alcohol abuse in her paternal grandfather; Arthritis in her  father; Cancer in her maternal aunt and maternal grandmother; Coronary artery disease in her brother and father; Heart attack in her maternal grandfather; Heart attack (age of onset: 37) in her father; Heart disease in her brother; Hyperlipidemia in her mother; Hypertension in her father; Thyroid disease in her mother.  She  reports that she has never smoked. She has never used smokeless tobacco. She reports that she does not drink alcohol or use illicit drugs.  Review of Systems  Constitutional: Negative for fever, chills, diaphoresis, activity change, appetite change, fatigue and unexpected weight change.  HENT: Positive for congestion, postnasal drip, rhinorrhea and sneezing. Negative for dental problem, ear discharge, ear pain, facial swelling, hearing loss, mouth sores, nosebleeds, sinus pressure, sore throat, tinnitus, trouble swallowing and voice change.   Eyes: Negative for photophobia, discharge, itching and visual disturbance.  Respiratory: Positive for shortness of breath. Negative for apnea, cough, choking, chest tightness, wheezing and stridor.   Cardiovascular: Negative for chest pain, palpitations and leg swelling.  Gastrointestinal: Negative for nausea, vomiting, abdominal pain, constipation, blood in stool and abdominal distention.  Genitourinary: Negative for dysuria, urgency, frequency, hematuria, flank pain, decreased urine volume and difficulty urinating.  Musculoskeletal: Negative for arthralgias, back pain, gait problem, joint swelling, myalgias, neck pain and neck stiffness.  Skin: Negative for color change, pallor and rash.  Neurological: Negative for dizziness, tremors, seizures, syncope, speech difficulty, weakness, light-headedness, numbness and headaches.  Hematological: Negative for adenopathy. Does not bruise/bleed easily.  Psychiatric/Behavioral: Negative for confusion, sleep disturbance and agitation. The patient is not nervous/anxious.    Physical Exam:  General  - No distress ENT - No sinus tenderness, no oral exudate, no LAN, no thyromegaly, TM clear, pupils equal/reactive, MP 3, 2+ tonsils, elongated uvula Cardiac - s1s2 regular, no murmur, pulses symmetric Chest - No wheeze/rales/dullness, good air entry, normal respiratory excursion Back - No focal tenderness Abd - Soft, non-tender, no organomegaly, + bowel sounds Ext - No edema Neuro - Normal strength, cranial nerves intact Skin - No rashes Psych - Normal mood, and behavior  Assessment/plan:  Chesley Mires, M.D. Pager [phone]

## 2013-10-06 NOTE — Assessment & Plan Note (Signed)
She has prior history of severe sleep apnea.  She continues to have snoring, sleep disruption, witnessed apnea, and daytime sleepiness.  Her BMI is > 35.  I am concerned she still has sleep apnea.  We discussed how sleep apnea can affect various health problems including risks for hypertension, cardiovascular disease, and diabetes.  We also discussed how sleep disruption can increase risks for accident, such as while driving.  Weight loss as a means of improving sleep apnea was also reviewed.  Additional treatment options discussed were CPAP therapy, oral appliance, and surgical intervention.  To further assess will arrange for home sleep study.

## 2013-10-06 NOTE — Progress Notes (Deleted)
   Subjective:    [name], female    DOB: 01-23-1951, 63 y.o.   MRN: [id number]  HPI    Review of Systems  Constitutional: Negative for fever, chills, diaphoresis, activity change, appetite change, fatigue and unexpected weight change.  HENT: Positive for congestion, postnasal drip, rhinorrhea and sneezing. Negative for dental problem, ear discharge, ear pain, facial swelling, hearing loss, mouth sores, nosebleeds, sinus pressure, sore throat, tinnitus, trouble swallowing and voice change.   Eyes: Negative for photophobia, discharge, itching and visual disturbance.  Respiratory: Positive for shortness of breath. Negative for apnea, cough, choking, chest tightness, wheezing and stridor.   Cardiovascular: Negative for chest pain, palpitations and leg swelling.  Gastrointestinal: Negative for nausea, vomiting, abdominal pain, constipation, blood in stool and abdominal distention.  Genitourinary: Negative for dysuria, urgency, frequency, hematuria, flank pain, decreased urine volume and difficulty urinating.  Musculoskeletal: Negative for arthralgias, back pain, gait problem, joint swelling, myalgias, neck pain and neck stiffness.  Skin: Negative for color change, pallor and rash.  Neurological: Negative for dizziness, tremors, seizures, syncope, speech difficulty, weakness, light-headedness, numbness and headaches.  Hematological: Negative for adenopathy. Does not bruise/bleed easily.  Psychiatric/Behavioral: Negative for confusion, sleep disturbance and agitation. The patient is not nervous/anxious.        Objective:   Physical Exam        Assessment & Plan:

## 2013-10-06 NOTE — Patient Instructions (Signed)
Will arrange for home sleep study Will call to arrange for follow up after sleep study reviewed  

## 2013-10-11 ENCOUNTER — Encounter: Payer: Self-pay | Admitting: Cardiology

## 2013-10-11 ENCOUNTER — Ambulatory Visit (HOSPITAL_COMMUNITY): Payer: 59 | Attending: Cardiology

## 2013-10-11 DIAGNOSIS — R0989 Other specified symptoms and signs involving the circulatory and respiratory systems: Secondary | ICD-10-CM

## 2013-10-11 MED ORDER — TECHNETIUM TC 99M SESTAMIBI GENERIC - CARDIOLITE
30.0000 | Freq: Once | INTRAVENOUS | Status: AC | PRN
  Administered 2013-10-11: 30 via INTRAVENOUS

## 2013-10-12 ENCOUNTER — Telehealth: Payer: Self-pay | Admitting: Cardiology

## 2013-10-12 NOTE — Telephone Encounter (Signed)
Returned call to patient Dr.Turner advised to wear for 4 weeks.

## 2013-10-12 NOTE — Telephone Encounter (Signed)
Follow up    Pt would like a return call on her work # then cell.  Pt returning our call for her test result.

## 2013-10-12 NOTE — Telephone Encounter (Signed)
New message     Pt states that she talked to Biddle earlier and have one more question to ask

## 2013-10-12 NOTE — Telephone Encounter (Signed)
Returned call to patient.Myoview results given.Stated she has occasional chest tightness.Advised to call back if chest tightness continues.

## 2013-10-12 NOTE — Telephone Encounter (Signed)
Returned call to patient she stated she wanted to ask Dr.Turner if she can remove monitor.Stated she has been wearing for 3 weeks.Message sent to Dr.Turner for advice.

## 2013-10-12 NOTE — Telephone Encounter (Signed)
I would prefer her to finish out the 4 weeks

## 2013-10-19 ENCOUNTER — Telehealth: Payer: Self-pay | Admitting: Cardiology

## 2013-10-19 ENCOUNTER — Other Ambulatory Visit: Payer: Self-pay | Admitting: General Surgery

## 2013-10-19 DIAGNOSIS — K219 Gastro-esophageal reflux disease without esophagitis: Secondary | ICD-10-CM

## 2013-10-19 MED ORDER — PANTOPRAZOLE SODIUM 40 MG PO TBEC: 40.0000 mg | DELAYED_RELEASE_TABLET | Freq: Two times a day (BID) | ORAL | Status: DC

## 2013-10-19 NOTE — Telephone Encounter (Signed)
Spoke to pt and gave results.

## 2013-10-19 NOTE — Telephone Encounter (Signed)
New Message  Pt returned call//SR

## 2013-10-27 ENCOUNTER — Telehealth: Payer: Self-pay | Admitting: Pulmonary Disease

## 2013-10-27 ENCOUNTER — Telehealth: Payer: Self-pay | Admitting: Cardiology

## 2013-10-27 NOTE — Telephone Encounter (Signed)
auth put into pt's chart for HST Joellen Jersey

## 2013-10-27 NOTE — Telephone Encounter (Signed)
Please let patient know that heart monitor showed sinus bradycardia to NSR 54-78bpm with 1 run of nonsustained atrial tachycardia up to 10 beats corresponding to palpitations. She is already on Bystolic and cannot increase further due to resting bradycardia.  Please find out how symptomatic she is with her palptiations

## 2013-10-28 NOTE — Telephone Encounter (Signed)
The extra heart beats wont hurt her.  They are benign. Her SOB may be coming from diastolic dysfunction noted on echo (stiff heart muscle).  Please have her come in for a BNP and also have her check her BP daily for a week and call with results

## 2013-10-28 NOTE — Telephone Encounter (Signed)
LVM for pt to return call

## 2013-10-28 NOTE — Telephone Encounter (Signed)
Spoke with pt and she hasnt had a really bad spell of fast HR's since the run on the monitor. Pt has some mild palpitations and she wants to know if it is okay to have these palpitations. Pt is feeling okay and she just wanted to make sure she wasn't having anything life-threatening. The main thing that has bothered her lately is being out of breath. It normally happens in the mornings after getting up and she feels like she has to breathe out of her mouth to catch her breath. Sometimes pt does have the palpitations with the SOB.

## 2013-10-29 ENCOUNTER — Telehealth: Payer: Self-pay | Admitting: Pulmonary Disease

## 2013-10-29 NOTE — Telephone Encounter (Signed)
LVM for pt to return call

## 2013-10-29 NOTE — Telephone Encounter (Signed)
Pt HST order was placed 10/06/13. Please advise pcc's pt is checking on this thanks

## 2013-11-01 ENCOUNTER — Telehealth: Payer: Self-pay | Admitting: General Surgery

## 2013-11-01 ENCOUNTER — Other Ambulatory Visit: Payer: Self-pay | Admitting: Family Medicine

## 2013-11-01 NOTE — Telephone Encounter (Signed)
[name], CMA at 10/29/2013 4:25 PM   Status: Signed       LVM for pt to return call.        [name], CMA at 10/28/2013 2:26 PM    Status: Signed        LVM for pt to return call.         [name] Margarita, MD at 10/28/2013 2:20 PM    Status: Signed        The extra heart beats wont hurt her. They are benign. Her SOB may be coming from diastolic dysfunction noted on echo (stiff heart muscle). Please have her come in for a BNP and also have her check her BP daily for a week and call with results

## 2013-11-01 NOTE — Telephone Encounter (Signed)
Spoke to pt HST has been approved by [facility] and she will be called on this week to be set up Joellen Jersey

## 2013-11-02 NOTE — Telephone Encounter (Signed)
LVM for pt to return call

## 2013-11-03 ENCOUNTER — Encounter: Payer: Self-pay | Admitting: Cardiology

## 2013-11-03 ENCOUNTER — Ambulatory Visit (INDEPENDENT_AMBULATORY_CARE_PROVIDER_SITE_OTHER): Payer: 59 | Admitting: Cardiology

## 2013-11-03 ENCOUNTER — Other Ambulatory Visit: Payer: Self-pay | Admitting: Cardiology

## 2013-11-03 VITALS — BP 166/96 | HR 63 | Ht 66.0 in | Wt 322.4 lb

## 2013-11-03 DIAGNOSIS — R0609 Other forms of dyspnea: Secondary | ICD-10-CM | POA: Insufficient documentation

## 2013-11-03 DIAGNOSIS — I10 Essential (primary) hypertension: Secondary | ICD-10-CM

## 2013-11-03 DIAGNOSIS — R079 Chest pain, unspecified: Secondary | ICD-10-CM

## 2013-11-03 DIAGNOSIS — R0602 Shortness of breath: Secondary | ICD-10-CM

## 2013-11-03 DIAGNOSIS — R9439 Abnormal result of other cardiovascular function study: Secondary | ICD-10-CM

## 2013-11-03 LAB — BASIC METABOLIC PANEL
BUN: 16 mg/dL (ref 6–23)
CO2: 31 mEq/L (ref 19–32)
Calcium: 9.5 mg/dL (ref 8.4–10.5)
Chloride: 102 mEq/L (ref 96–112)
Creatinine, Ser: 0.8 mg/dL (ref 0.4–1.2)
GFR: 72.79 mL/min (ref >60.00)
Glucose, Bld: 106 mg/dL — ABNORMAL HIGH (ref 70–99)
POTASSIUM: 4.5 meq/L (ref 3.5–5.1)
SODIUM: 140 meq/L (ref 135–145)

## 2013-11-03 LAB — CBC WITH DIFFERENTIAL/PLATELET
BASOS ABS: 0 10*3/uL (ref 0.0–0.1)
Basophils Relative: 0.3 % (ref 0.0–3.0)
Eosinophils Absolute: 0.1 10*3/uL (ref 0.0–0.7)
Eosinophils Relative: 0.8 % (ref 0.0–5.0)
HCT: 39.8 % (ref 36.0–46.0)
Hemoglobin: 13.1 g/dL (ref 12.0–15.0)
Lymphocytes Relative: 24.4 % (ref 12.0–46.0)
Lymphs Abs: 1.9 10*3/uL (ref 0.7–4.0)
MCHC: 33 g/dL (ref 30.0–36.0)
MCV: 89.3 fl (ref 78.0–100.0)
MONO ABS: 0.4 10*3/uL (ref 0.1–1.0)
Monocytes Relative: 5.7 % (ref 3.0–12.0)
NEUTROS PCT: 68.8 % (ref 43.0–77.0)
Neutro Abs: 5.4 10*3/uL (ref 1.4–7.7)
PLATELETS: 260 10*3/uL (ref 150.0–400.0)
RBC: 4.46 Mil/uL (ref 3.87–5.11)
RDW: 14.5 % (ref 11.5–15.5)
WBC: 7.8 10*3/uL (ref 4.0–10.5)

## 2013-11-03 LAB — BRAIN NATRIURETIC PEPTIDE: PRO B NATRI PEPTIDE: 97 pg/mL (ref 0.0–100.0)

## 2013-11-03 MED ORDER — SODIUM CHLORIDE 0.9 % IV SOLN: INTRAVENOUS | Status: DC

## 2013-11-03 NOTE — H&P (Signed)
[place], [place], [place]  [address] Phone: [phone] Fax:  [phone]  Date:  11/03/2013   ID:  [name], DOB 10-25-1950, MRN [id number]  PCP:  Penni Homans, MD  Cardiologist:  Fransico Him, MD     History of Present Illness: [name] is a 63 y.o. female with a history of HTN and dyslipidemia who presents today for followup of palpitations. She says that she has some heart palpitations at times but recently had an episode of prolonged palpitations that was different in that it was a continous quivering in her heart while lying in bed. She says that at time she feels pressure in her chest that usually occurs when she is at work. There is no radiation of the pressure except occasionally to her back. She says that she is occasionally feels SOB but it mainly is a feeling that she cannot breathe through her nose and cannot get enough air breathing through her mouth. She has a history of abnormal nuclear stress test in the past with cath in 2010 showing 20% mid LAD lesion. She denies any dizziness or syncope. She denies any LE edema. She says that the palpitations occur when she is resting. Both her dad and brother have a history of CAD. She has been under a lot of stress. Her Dad was killed and she had to move in with her mom which has caused her a lot of stress. She underwent recent nuclear stress test which showed small defect in very distal anteroseptal and apical wall that improves at rest. Otherwise normal perfusion and felt to possibly  represent some shifting soft tissue (breast) but could not exclude trivial ischemia. Overall low risk scan.  Heart monitor showed nonsustained atrial tachycardia.  She thinks that the palpitations are better.  She continues to have SOB and chest discomfort.  She says she has never had SOB and is very concerned.  She says that she gets SOB with minimal activity and then gets chest tightness.     Wt Readings from Last 3  Encounters:  10/06/13 320 lb (145.151 kg)  10/05/13 316 lb (143.337 kg)  09/20/13 317 lb (143.79 kg)     Past Medical History  Diagnosis Date  . GERD (gastroesophageal reflux disease)   . Anxiety   . Hyperlipidemia   . Hypertension   . Hx of colonic polyps   . Kidney stones   . Ventral hernia   . Chicken pox as a child  . Measles as a child  . Mumps as a child  . Obesity   . Vasomotor rhinitis 04/05/2012  . Sleep apnea 04/05/2012  . Anemia 04/05/2012  . Elevated LFTs 04/05/2012  . OA (osteoarthritis) of knee 04/05/2012  . Tinea corporis 02/23/2013  . Preventative health care 09/05/2013   Current Outpatient Prescriptions  Medication Sig Dispense Refill  . ALPRAZolam (XANAX) 0.25 MG tablet Take 1 tablet (0.25 mg total) by mouth at bedtime as needed for sleep or anxiety.  60 tablet  1  . aspirin 81 MG tablet Take 81 mg by mouth daily.        . cetirizine (ZYRTEC) 10 MG tablet Take 1 tablet (10 mg total) by mouth daily.  90 tablet  3  . clotrimazole-betamethasone (LOTRISONE) cream Apply topically 2 (two) times daily.  45 g  2  . diclofenac (VOLTAREN) 75 MG EC tablet TAKE 1 TABLET BY MOUTH 2 TIMES DAILY  180 tablet  1  .  escitalopram (LEXAPRO) 20 MG tablet TAKE 1 TABLET BY MOUTH DAILY.  90 tablet  3  . fluticasone (FLONASE) 50 MCG/ACT nasal spray Place into both nostrils daily.      . hydrALAZINE (APRESOLINE) 25 MG tablet TAKE 1 TABLET BY MOUTH 2 TIMES DAILY.  180 tablet  3  . losartan-hydrochlorothiazide (HYZAAR) 100-25 MG per tablet Take 1 tablet by mouth daily.  90 tablet  3  . nebivolol (BYSTOLIC) 10 MG tablet Take 1 tablet (10 mg total) by mouth daily.  90 tablet  3  . pantoprazole (PROTONIX) 40 MG tablet Take 1 tablet (40 mg total) by mouth 2 (two) times daily.  180 tablet  3  . ranitidine (ZANTAC) 300 MG tablet Take 1 tablet (300 mg total) by mouth at bedtime.  90 tablet  3  . simvastatin (ZOCOR) 40 MG tablet Take 1 tablet (40 mg total) by mouth at bedtime.  90 tablet  3    No current facility-administered medications for this visit.    Allergies:   No Known Allergies  Social History:  The patient  reports that she has never smoked. She has never used smokeless tobacco. She reports that she does not drink alcohol or use illicit drugs.   Family History:  The patient's family history includes Alcohol abuse in her paternal grandfather; Arthritis in her father; Cancer in her maternal aunt and maternal grandmother; Coronary artery disease in her brother and father; Heart attack in her maternal grandfather; Heart attack (age of onset: 23) in her father; Heart disease in her brother; Hyperlipidemia in her mother; Hypertension in her father; Thyroid disease in her mother.   ROS:  Please see the history of present illness.      All other systems reviewed and negative.   PHYSICAL EXAM: VS:  There were no vitals taken for this visit. Well nourished, well developed, in no acute distress HEENT: normal Neck: no JVD Cardiac:  normal S1, S2; RRR; no murmur Lungs:  clear to auscultation bilaterally, no wheezing, rhonchi or rales Abd: soft, nontender, no hepatomegaly Ext: no edema Skin: warm and dry Neuro:  CNs 2-12 intact, no focal abnormalities noted  ASSESSMENT AND PLAN:  1.  Exertional SOB with chest pain with a strong family history of ASCAD. She had nonobstructive disease at the time of her cath in 2010. Leane Call showed a very small defect in the distal anteroseptal and apical wall that was felt to be most likely secondary to breast attenuation and was low risk.  This scan is similar to her scan in 2009 at which time cath showed 20% LAD lesion.  She is very concerned that her SOB is new and is very bothersome.  She says that she gets SOB with minimal exertion and then gets chest tightness.  I think that given her abnormal nuclear study and symptoms that are new that we should proceed with right and left heart cath.  I suspect that her SOB is due to obesity and  diastolic dysfunction but her symptoms have persisted and she does have significant CRF. Coronary CTA would be of low yield in her given her significant obesity.   If cath is normal then will refer to Pulmonary.  I will check a BNP today in the office and if elevated will d/c her HCTZ and start Lasix.  We will also be able to evaluate LVEDP at time of cath. 2.  HTN mildly elevated but she has white coat syndrome and is normal at home -  continue Hydralazine/Hyzar/bystolic  3.  Palpitations-with monitor showing nonsustained atrial tachycardia - palpitations have improved - continue beta blocker 4.  Dyslipidemia - continue statin  5.  Nonobstructive ASCAD by cath 2010 - continue ASA   Followup after cath      Signed, Fransico Him, MD 11/03/2013 8:16 AM

## 2013-11-03 NOTE — Patient Instructions (Addendum)
Will obtain labs today and call you with the results (bmet/cbc/bnp)  Your physician recommends that you continue on your current medications as directed. Please refer to the Current Medication list given to you today.  You are scheduled for a cardiac catheterization on Nov 08, 2013 with Dr. Angelena Form.  Go to [facility] 2nd Floor Short Stay on Monday May 18 at 5:30 am.  Enter thru the [place] entrance A No food or drink after midnight on Nov 07, 2013. You may take your medications with a sip of water on the day of your procedure.   Your physician recommends that you schedule a follow-up appointment in: [facility] OF June

## 2013-11-03 NOTE — Progress Notes (Signed)
  [address] [place], [place]  [address] Phone: [phone] Fax:  [phone]  Date:  11/03/2013   ID:  [name], DOB 04/05/1951, MRN [id number]  PCP:  BLYTH, STACEY, MD  Cardiologist:  Traci Turner, MD     History of Present Illness: [name] is a 63 y.o. female with a history of HTN and dyslipidemia who presents today for followup of palpitations. She says that she has some heart palpitations at times but recently had an episode of prolonged palpitations that was different in that it was a continous quivering in her heart while lying in bed. She says that at time she feels pressure in her chest that usually occurs when she is at work. There is no radiation of the pressure except occasionally to her back. She says that she is occasionally feels SOB but it mainly is a feeling that she cannot breathe through her nose and cannot get enough air breathing through her mouth. She has a history of abnormal nuclear stress test in the past with cath in 2010 showing 20% mid LAD lesion. She denies any dizziness or syncope. She denies any LE edema. She says that the palpitations occur when she is resting. Both her dad and brother have a history of CAD. She has been under a lot of stress. Her Dad was killed and she had to move in with her mom which has caused her a lot of stress. She underwent recent nuclear stress test which showed small defect in very distal anteroseptal and apical wall that improves at rest. Otherwise normal perfusion and felt to possibly  represent some shifting soft tissue (breast) but could not exclude trivial ischemia. Overall low risk scan.  Heart monitor showed nonsustained atrial tachycardia.  She thinks that the palpitations are better.  She continues to have SOB and chest discomfort.  She says she has never had SOB and is very concerned.  She says that she gets SOB with minimal activity and then gets chest tightness.     Wt Readings from Last 3  Encounters:  10/06/13 320 lb (145.151 kg)  10/05/13 316 lb (143.337 kg)  09/20/13 317 lb (143.79 kg)     Past Medical History  Diagnosis Date  . GERD (gastroesophageal reflux disease)   . Anxiety   . Hyperlipidemia   . Hypertension   . Hx of colonic polyps   . Kidney stones   . Ventral hernia   . Chicken pox as a child  . Measles as a child  . Mumps as a child  . Obesity   . Vasomotor rhinitis 04/05/2012  . Sleep apnea 04/05/2012  . Anemia 04/05/2012  . Elevated LFTs 04/05/2012  . OA (osteoarthritis) of knee 04/05/2012  . Tinea corporis 02/23/2013  . Preventative health care 09/05/2013   Current Outpatient Prescriptions  Medication Sig Dispense Refill  . ALPRAZolam (XANAX) 0.25 MG tablet Take 1 tablet (0.25 mg total) by mouth at bedtime as needed for sleep or anxiety.  60 tablet  1  . aspirin 81 MG tablet Take 81 mg by mouth daily.        . cetirizine (ZYRTEC) 10 MG tablet Take 1 tablet (10 mg total) by mouth daily.  90 tablet  3  . clotrimazole-betamethasone (LOTRISONE) cream Apply topically 2 (two) times daily.  45 g  2  . diclofenac (VOLTAREN) 75 MG EC tablet TAKE 1 TABLET BY MOUTH 2 TIMES DAILY  180 tablet  1  .   escitalopram (LEXAPRO) 20 MG tablet TAKE 1 TABLET BY MOUTH DAILY.  90 tablet  3  . fluticasone (FLONASE) 50 MCG/ACT nasal spray Place into both nostrils daily.      . hydrALAZINE (APRESOLINE) 25 MG tablet TAKE 1 TABLET BY MOUTH 2 TIMES DAILY.  180 tablet  3  . losartan-hydrochlorothiazide (HYZAAR) 100-25 MG per tablet Take 1 tablet by mouth daily.  90 tablet  3  . nebivolol (BYSTOLIC) 10 MG tablet Take 1 tablet (10 mg total) by mouth daily.  90 tablet  3  . pantoprazole (PROTONIX) 40 MG tablet Take 1 tablet (40 mg total) by mouth 2 (two) times daily.  180 tablet  3  . ranitidine (ZANTAC) 300 MG tablet Take 1 tablet (300 mg total) by mouth at bedtime.  90 tablet  3  . simvastatin (ZOCOR) 40 MG tablet Take 1 tablet (40 mg total) by mouth at bedtime.  90 tablet  3    No current facility-administered medications for this visit.    Allergies:   No Known Allergies  Social History:  The patient  reports that she has never smoked. She has never used smokeless tobacco. She reports that she does not drink alcohol or use illicit drugs.   Family History:  The patient's family history includes Alcohol abuse in her paternal grandfather; Arthritis in her father; Cancer in her maternal aunt and maternal grandmother; Coronary artery disease in her brother and father; Heart attack in her maternal grandfather; Heart attack (age of onset: 69) in her father; Heart disease in her brother; Hyperlipidemia in her mother; Hypertension in her father; Thyroid disease in her mother.   ROS:  Please see the history of present illness.      All other systems reviewed and negative.   PHYSICAL EXAM: VS:  There were no vitals taken for this visit. Well nourished, well developed, in no acute distress HEENT: normal Neck: no JVD Cardiac:  normal S1, S2; RRR; no murmur Lungs:  clear to auscultation bilaterally, no wheezing, rhonchi or rales Abd: soft, nontender, no hepatomegaly Ext: no edema Skin: warm and dry Neuro:  CNs 2-12 intact, no focal abnormalities noted  ASSESSMENT AND PLAN:  1.  Exertional SOB with chest pain with a strong family history of ASCAD. She had nonobstructive disease at the time of her cath in 2010. Lexiscan  myoview showed a very small defect in the distal anteroseptal and apical wall that was felt to be most likely secondary to breast attenuation and was low risk.  This scan is similar to her scan in 2009 at which time cath showed 20% LAD lesion.  She is very concerned that her SOB is new and is very bothersome.  She says that she gets SOB with minimal exertion and then gets chest tightness.  I think that given her abnormal nuclear study and symptoms that are new that we should proceed with right and left heart cath.  I suspect that her SOB is due to obesity and  diastolic dysfunction but her symptoms have persisted and she does have significant CRF. Coronary CTA would be of low yield in her given her significant obesity.   If cath is normal then will refer to Pulmonary.  I will check a BNP today in the office and if elevated will d/c her HCTZ and start Lasix.  We will also be able to evaluate LVEDP at time of cath. 2.  HTN mildly elevated but she has white coat syndrome and is normal at home -   continue Hydralazine/Hyzar/bystolic  3.  Palpitations-with monitor showing nonsustained atrial tachycardia - palpitations have improved - continue beta blocker 4.  Dyslipidemia - continue statin  5.  Nonobstructive ASCAD by cath 2010 - continue ASA   Followup after cath      Signed, Traci Turner, MD 11/03/2013 8:16 AM  

## 2013-11-04 ENCOUNTER — Encounter (HOSPITAL_COMMUNITY): Payer: Self-pay | Admitting: Pharmacy Technician

## 2013-11-05 ENCOUNTER — Other Ambulatory Visit: Payer: Self-pay | Admitting: *Deleted

## 2013-11-05 DIAGNOSIS — R079 Chest pain, unspecified: Secondary | ICD-10-CM

## 2013-11-05 DIAGNOSIS — R0602 Shortness of breath: Secondary | ICD-10-CM

## 2013-11-05 DIAGNOSIS — G4733 Obstructive sleep apnea (adult) (pediatric): Secondary | ICD-10-CM

## 2013-11-05 NOTE — Telephone Encounter (Signed)
Pt has already  Had this done. BNP was normal.

## 2013-11-08 ENCOUNTER — Telehealth: Payer: Self-pay | Admitting: Cardiology

## 2013-11-08 ENCOUNTER — Encounter (HOSPITAL_COMMUNITY)
Admission: RE | Disposition: A | Discharge status: Home / Self Care | Payer: Self-pay | Source: Ambulatory Visit | Attending: Cardiovascular Disease

## 2013-11-08 ENCOUNTER — Ambulatory Visit (HOSPITAL_COMMUNITY)
Admission: RE | Admit: 2013-11-08 | Discharge: 2013-11-08 | Disposition: A | Discharge status: Home / Self Care | Payer: 59 | Source: Ambulatory Visit | Attending: Cardiovascular Disease | Admitting: Cardiovascular Disease

## 2013-11-08 DIAGNOSIS — K219 Gastro-esophageal reflux disease without esophagitis: Secondary | ICD-10-CM | POA: Insufficient documentation

## 2013-11-08 DIAGNOSIS — M1710 Unilateral primary osteoarthritis, unspecified knee: Secondary | ICD-10-CM | POA: Insufficient documentation

## 2013-11-08 DIAGNOSIS — G4730 Sleep apnea, unspecified: Secondary | ICD-10-CM | POA: Insufficient documentation

## 2013-11-08 DIAGNOSIS — I2510 Atherosclerotic heart disease of native coronary artery without angina pectoris: Secondary | ICD-10-CM | POA: Insufficient documentation

## 2013-11-08 DIAGNOSIS — F411 Generalized anxiety disorder: Secondary | ICD-10-CM | POA: Insufficient documentation

## 2013-11-08 DIAGNOSIS — I10 Essential (primary) hypertension: Secondary | ICD-10-CM | POA: Insufficient documentation

## 2013-11-08 DIAGNOSIS — E785 Hyperlipidemia, unspecified: Secondary | ICD-10-CM | POA: Insufficient documentation

## 2013-11-08 DIAGNOSIS — R002 Palpitations: Secondary | ICD-10-CM | POA: Insufficient documentation

## 2013-11-08 DIAGNOSIS — Z7982 Long term (current) use of aspirin: Secondary | ICD-10-CM | POA: Insufficient documentation

## 2013-11-08 DIAGNOSIS — R0989 Other specified symptoms and signs involving the circulatory and respiratory systems: Secondary | ICD-10-CM | POA: Insufficient documentation

## 2013-11-08 DIAGNOSIS — R0609 Other forms of dyspnea: Secondary | ICD-10-CM

## 2013-11-08 DIAGNOSIS — R0602 Shortness of breath: Secondary | ICD-10-CM

## 2013-11-08 DIAGNOSIS — E669 Obesity, unspecified: Secondary | ICD-10-CM | POA: Insufficient documentation

## 2013-11-08 HISTORY — PX: LEFT AND RIGHT HEART CATHETERIZATION WITH CORONARY ANGIOGRAM: SHX5449

## 2013-11-08 LAB — POCT I-STAT 3, ART BLOOD GAS (G3+)
Acid-Base Excess: 4 mmol/L — ABNORMAL HIGH (ref 0.0–2.0)
BICARBONATE: 29.1 meq/L — AB (ref 20.0–24.0)
O2 SAT: 93 %
PO2 ART: 67 mmHg — AB (ref 80.0–100.0)
TCO2: 31 mmol/L (ref 0–100)
pCO2 arterial: 46 mmHg — ABNORMAL HIGH (ref 35.0–45.0)
pH, Arterial: 7.41 (ref 7.350–7.450)

## 2013-11-08 LAB — PROTIME-INR
INR: 0.94 (ref 0.00–1.49)
Prothrombin Time: 12.4 seconds (ref 11.6–15.2)

## 2013-11-08 LAB — POCT I-STAT 3, VENOUS BLOOD GAS (G3P V)
ACID-BASE EXCESS: 3 mmol/L — AB (ref 0.0–2.0)
Bicarbonate: 29 mEq/L — ABNORMAL HIGH (ref 20.0–24.0)
O2 SAT: 69 %
PO2 VEN: 37 mmHg (ref 30.0–45.0)
TCO2: 30 mmol/L (ref 0–100)
pCO2, Ven: 48.9 mmHg (ref 45.0–50.0)
pH, Ven: 7.381 — ABNORMAL HIGH (ref 7.250–7.300)

## 2013-11-08 LAB — POCT ACTIVATED CLOTTING TIME: ACTIVATED CLOTTING TIME: 166 s

## 2013-11-08 SURGERY — LEFT AND RIGHT HEART CATHETERIZATION WITH CORONARY ANGIOGRAM
Anesthesia: LOCAL

## 2013-11-08 MED ORDER — VERAPAMIL HCL 2.5 MG/ML IV SOLN
INTRAVENOUS | Status: AC
  Filled 2013-11-08: qty 2

## 2013-11-08 MED ORDER — MIDAZOLAM HCL 2 MG/2ML IJ SOLN
INTRAMUSCULAR | Status: AC
  Filled 2013-11-08: qty 2

## 2013-11-08 MED ORDER — SODIUM CHLORIDE 0.9 % IV SOLN: 250.0000 mL | INTRAVENOUS | Status: DC | PRN

## 2013-11-08 MED ORDER — LIDOCAINE HCL (PF) 1 % IJ SOLN
INTRAMUSCULAR | Status: AC
  Filled 2013-11-08: qty 30

## 2013-11-08 MED ORDER — FENTANYL CITRATE 0.05 MG/ML IJ SOLN
INTRAMUSCULAR | Status: AC
  Filled 2013-11-08: qty 2

## 2013-11-08 MED ORDER — HEPARIN (PORCINE) IN NACL 2-0.9 UNIT/ML-% IJ SOLN
INTRAMUSCULAR | Status: AC
  Filled 2013-11-08: qty 1000

## 2013-11-08 MED ORDER — SODIUM CHLORIDE 0.9 % IV SOLN: INTRAVENOUS | Status: AC

## 2013-11-08 MED ORDER — SODIUM CHLORIDE 0.9 % IJ SOLN: 3.0000 mL | INTRAMUSCULAR | Status: DC | PRN

## 2013-11-08 MED ORDER — HEPARIN SODIUM (PORCINE) 1000 UNIT/ML IJ SOLN
INTRAMUSCULAR | Status: AC
  Filled 2013-11-08: qty 1

## 2013-11-08 MED ORDER — NITROGLYCERIN 0.2 MG/ML ON CALL CATH LAB
INTRAVENOUS | Status: AC
  Filled 2013-11-08: qty 1

## 2013-11-08 MED ORDER — SODIUM CHLORIDE 0.9 % IJ SOLN: 3.0000 mL | Freq: Two times a day (BID) | INTRAMUSCULAR | Status: DC

## 2013-11-08 MED ORDER — ASPIRIN 81 MG PO CHEW
81.0000 mg | CHEWABLE_TABLET | ORAL | Status: AC
  Administered 2013-11-08: 81 mg via ORAL
  Filled 2013-11-08: qty 1

## 2013-11-08 NOTE — Telephone Encounter (Signed)
Patient had cath showing elevated LVEDP.  Please have her stop her HCTZ and start lasix and take 20mg  2 tablets daily for 3 days then 1 tablet daily.  BMET in 1 week

## 2013-11-08 NOTE — CV Procedure (Signed)
      Cardiac Catheterization Operative Report  [name] [id number] 5/18/20158:41 AM Penni Homans, MD  Procedure Performed:  1. Left Heart Catheterization 2. Selective Coronary Angiography 3. Right Heart Catheterization  Operator: Lauree Chandler, MD  Access: Right radial artery, right antecubital vein  Indication: 63 yo female with recent dyspnea. Cardiac cath to assess pressures and exclude obstructive CAD.                                  Procedure Details: The risks, benefits, complications, treatment options, and expected outcomes were discussed with the patient. The patient and/or family concurred with the proposed plan, giving informed consent. The patient was brought to the cath lab after IV hydration was begun and oral premedication was given. The patient was further sedated with Versed and Fentanyl. The right wrist and right arm were prepped and draped in the usual manner. Using the modified Seldinger access technique, a 5/6 French sheath was placed in the right radial artery. Under U/S guidance a 5 French sheath was placed in the right antecubital vein. A balloon tipped catheter was used to perform a right heart catheterization. Standard diagnostic catheters were used to perform selective coronary angiography. LV pressures were obtained with the JR4 catheter. I was unable to advance a pigtail catheter into the LV due to intense spasm of the radial artery at the end of the case. No LV gram was performed. There were no immediate complications. The patient was taken to the recovery area in stable condition.   Hemodynamic Findings: Ao: 135/84          LV: 134/15/19 RA: 11     RV: 37/13/18 PA:  47/27 (34)      PCWP: 28 Fick Cardiac Output: 7.4 L/min Fick Cardiac Index: 3.1 L/min/m2 Central Aortic Saturation: 93% Pulmonary Artery Saturation: 69%  Angiographic Findings:  Left main: No obstructive disease.   Left Anterior Descending Artery: Large caliber  vessel that courses to the apex. 20% mid stenosis. Moderate caliber first diagonal branch with ostial 20% stenosis.   Circumflex Artery: Large caliber vessel with two moderate caliber OM branches. 20% proximal stenosis.   Right Coronary Artery: Large dominant vessel with 20% ostial stenosis.   Left Ventricular Angiogram: Deferred.   Impression: 1. Mild non-obstructive CAD 2. Elevated filling pressures.  3. Dyspnea  Recommendations: Medical management of mild CAD with statin. Her dyspnea is likely multi-factorial but she may have some improvement in symptoms with mild diuresis. Will discuss with Dr. [name].        Complications:  None; patient tolerated the procedure well.

## 2013-11-08 NOTE — Discharge Instructions (Signed)

## 2013-11-08 NOTE — Interval H&P Note (Signed)
History and Physical Interval Note:  11/08/2013 7:45 AM  [name]  has presented today for cardiac cath with the diagnosis of shortness of breath  The various methods of treatment have been discussed with the patient and family. After consideration of risks, benefits and other options for treatment, the patient has consented to  Procedure(s): LEFT AND RIGHT HEART CATHETERIZATION WITH CORONARY ANGIOGRAM (N/A) as a surgical intervention .  The patient's history has been reviewed, patient examined, no change in status, stable for surgery.  I have reviewed the patient's chart and labs.  Questions were answered to the patient's satisfaction.    Cath Lab Visit (complete for each Cath Lab visit)  Clinical Evaluation Leading to the Procedure:   ACS: no  Non-ACS:    Anginal Classification: CCS II  Anti-ischemic medical therapy: Minimal Therapy (1 class of medications)  Non-Invasive Test Results: No non-invasive testing performed  Prior CABG: No previous CABG        [name]

## 2013-11-08 NOTE — H&P (View-Only) (Signed)
  [address] [place], [place]  [address] Phone: [phone] Fax:  [phone]  Date:  11/03/2013   ID:  [name], DOB 07/04/1950, MRN [id number]  PCP:  BLYTH, STACEY, MD  Cardiologist:  Itzel Lowrimore, MD     History of Present Illness: [name] is a 62 y.o. female with a history of HTN and dyslipidemia who presents today for followup of palpitations. She says that she has some heart palpitations at times but recently had an episode of prolonged palpitations that was different in that it was a continous quivering in her heart while lying in bed. She says that at time she feels pressure in her chest that usually occurs when she is at work. There is no radiation of the pressure except occasionally to her back. She says that she is occasionally feels SOB but it mainly is a feeling that she cannot breathe through her nose and cannot get enough air breathing through her mouth. She has a history of abnormal nuclear stress test in the past with cath in 2010 showing 20% mid LAD lesion. She denies any dizziness or syncope. She denies any LE edema. She says that the palpitations occur when she is resting. Both her dad and brother have a history of CAD. She has been under a lot of stress. Her Dad was killed and she had to move in with her mom which has caused her a lot of stress. She underwent recent nuclear stress test which showed small defect in very distal anteroseptal and apical wall that improves at rest. Otherwise normal perfusion and felt to possibly  represent some shifting soft tissue (breast) but could not exclude trivial ischemia. Overall low risk scan.  Heart monitor showed nonsustained atrial tachycardia.  She thinks that the palpitations are better.  She continues to have SOB and chest discomfort.  She says she has never had SOB and is very concerned.  She says that she gets SOB with minimal activity and then gets chest tightness.     Wt Readings from Last 3  Encounters:  10/06/13 320 lb (145.151 kg)  10/05/13 316 lb (143.337 kg)  09/20/13 317 lb (143.79 kg)     Past Medical History  Diagnosis Date  . GERD (gastroesophageal reflux disease)   . Anxiety   . Hyperlipidemia   . Hypertension   . Hx of colonic polyps   . Kidney stones   . Ventral hernia   . Chicken pox as a child  . Measles as a child  . Mumps as a child  . Obesity   . Vasomotor rhinitis 04/05/2012  . Sleep apnea 04/05/2012  . Anemia 04/05/2012  . Elevated LFTs 04/05/2012  . OA (osteoarthritis) of knee 04/05/2012  . Tinea corporis 02/23/2013  . Preventative health care 09/05/2013   Current Outpatient Prescriptions  Medication Sig Dispense Refill  . ALPRAZolam (XANAX) 0.25 MG tablet Take 1 tablet (0.25 mg total) by mouth at bedtime as needed for sleep or anxiety.  60 tablet  1  . aspirin 81 MG tablet Take 81 mg by mouth daily.        . cetirizine (ZYRTEC) 10 MG tablet Take 1 tablet (10 mg total) by mouth daily.  90 tablet  3  . clotrimazole-betamethasone (LOTRISONE) cream Apply topically 2 (two) times daily.  45 g  2  . diclofenac (VOLTAREN) 75 MG EC tablet TAKE 1 TABLET BY MOUTH 2 TIMES DAILY  180 tablet  1  .   escitalopram (LEXAPRO) 20 MG tablet TAKE 1 TABLET BY MOUTH DAILY.  90 tablet  3  . fluticasone (FLONASE) 50 MCG/ACT nasal spray Place into both nostrils daily.      . hydrALAZINE (APRESOLINE) 25 MG tablet TAKE 1 TABLET BY MOUTH 2 TIMES DAILY.  180 tablet  3  . losartan-hydrochlorothiazide (HYZAAR) 100-25 MG per tablet Take 1 tablet by mouth daily.  90 tablet  3  . nebivolol (BYSTOLIC) 10 MG tablet Take 1 tablet (10 mg total) by mouth daily.  90 tablet  3  . pantoprazole (PROTONIX) 40 MG tablet Take 1 tablet (40 mg total) by mouth 2 (two) times daily.  180 tablet  3  . ranitidine (ZANTAC) 300 MG tablet Take 1 tablet (300 mg total) by mouth at bedtime.  90 tablet  3  . simvastatin (ZOCOR) 40 MG tablet Take 1 tablet (40 mg total) by mouth at bedtime.  90 tablet  3    No current facility-administered medications for this visit.    Allergies:   No Known Allergies  Social History:  The patient  reports that she has never smoked. She has never used smokeless tobacco. She reports that she does not drink alcohol or use illicit drugs.   Family History:  The patient's family history includes Alcohol abuse in her paternal grandfather; Arthritis in her father; Cancer in her maternal aunt and maternal grandmother; Coronary artery disease in her brother and father; Heart attack in her maternal grandfather; Heart attack (age of onset: 69) in her father; Heart disease in her brother; Hyperlipidemia in her mother; Hypertension in her father; Thyroid disease in her mother.   ROS:  Please see the history of present illness.      All other systems reviewed and negative.   PHYSICAL EXAM: VS:  There were no vitals taken for this visit. Well nourished, well developed, in no acute distress HEENT: normal Neck: no JVD Cardiac:  normal S1, S2; RRR; no murmur Lungs:  clear to auscultation bilaterally, no wheezing, rhonchi or rales Abd: soft, nontender, no hepatomegaly Ext: no edema Skin: warm and dry Neuro:  CNs 2-12 intact, no focal abnormalities noted  ASSESSMENT AND PLAN:  1.  Exertional SOB with chest pain with a strong family history of ASCAD. She had nonobstructive disease at the time of her cath in 2010. Lexiscan  myoview showed a very small defect in the distal anteroseptal and apical wall that was felt to be most likely secondary to breast attenuation and was low risk.  This scan is similar to her scan in 2009 at which time cath showed 20% LAD lesion.  She is very concerned that her SOB is new and is very bothersome.  She says that she gets SOB with minimal exertion and then gets chest tightness.  I think that given her abnormal nuclear study and symptoms that are new that we should proceed with right and left heart cath.  I suspect that her SOB is due to obesity and  diastolic dysfunction but her symptoms have persisted and she does have significant CRF. Coronary CTA would be of low yield in her given her significant obesity.   If cath is normal then will refer to Pulmonary.  I will check a BNP today in the office and if elevated will d/c her HCTZ and start Lasix.  We will also be able to evaluate LVEDP at time of cath. 2.  HTN mildly elevated but she has white coat syndrome and is normal at home -   continue Hydralazine/Hyzar/bystolic  3.  Palpitations-with monitor showing nonsustained atrial tachycardia - palpitations have improved - continue beta blocker 4.  Dyslipidemia - continue statin  5.  Nonobstructive ASCAD by cath 2010 - continue ASA   Followup after cath      Signed, Fransico Him, MD 11/03/2013 8:16 AM

## 2013-11-09 ENCOUNTER — Telehealth: Payer: Self-pay | Admitting: Pulmonary Disease

## 2013-11-09 DIAGNOSIS — G4733 Obstructive sleep apnea (adult) (pediatric): Secondary | ICD-10-CM

## 2013-11-09 NOTE — Telephone Encounter (Signed)
ROV has been scheduled for 12/01/13 at 1:30pm.

## 2013-11-09 NOTE — Telephone Encounter (Signed)
HST 11/05/13 >> AHI 68.1, SaO2 low 62%.  Will have my nurse schedule ROV to review results.

## 2013-11-10 ENCOUNTER — Other Ambulatory Visit: Payer: Self-pay | Admitting: General Surgery

## 2013-11-10 MED ORDER — LOSARTAN POTASSIUM 100 MG PO TABS: 100.0000 mg | ORAL_TABLET | Freq: Every day | ORAL | Status: DC

## 2013-11-10 MED ORDER — FUROSEMIDE 20 MG PO TABS: ORAL_TABLET | ORAL | Status: DC

## 2013-11-10 NOTE — Telephone Encounter (Signed)
Continue Losartan 100mg  daily and stop HCTZ portion and start Lasix

## 2013-11-10 NOTE — Telephone Encounter (Signed)
Pt is aware. New meds called in. Bmet scheduled next weds 5/27

## 2013-11-10 NOTE — Telephone Encounter (Signed)
Pt is aware of med change

## 2013-11-10 NOTE — Telephone Encounter (Signed)
I have that pt is taking losartan-hctz. Is that what you want pt to stop and then start the lasix?

## 2013-11-11 ENCOUNTER — Encounter: Payer: Self-pay | Admitting: Pulmonary Disease

## 2013-11-12 ENCOUNTER — Telehealth: Payer: Self-pay | Admitting: Family Medicine

## 2013-11-12 NOTE — Telephone Encounter (Signed)
AZEKASTINE HCL 137 ,CG SPRAY  [facility]

## 2013-11-16 NOTE — Telephone Encounter (Signed)
Can you check if spelling is correct? Nothing is coming up with this name

## 2013-11-17 ENCOUNTER — Encounter: Payer: Self-pay | Admitting: Cardiology

## 2013-11-17 ENCOUNTER — Ambulatory Visit (INDEPENDENT_AMBULATORY_CARE_PROVIDER_SITE_OTHER): Payer: 59 | Admitting: *Deleted

## 2013-11-17 DIAGNOSIS — E785 Hyperlipidemia, unspecified: Secondary | ICD-10-CM

## 2013-11-17 DIAGNOSIS — I10 Essential (primary) hypertension: Secondary | ICD-10-CM

## 2013-11-17 LAB — BASIC METABOLIC PANEL
BUN: 21 mg/dL (ref 6–23)
CALCIUM: 9.4 mg/dL (ref 8.4–10.5)
CO2: 28 mEq/L (ref 19–32)
CREATININE: 0.9 mg/dL (ref 0.4–1.2)
Chloride: 104 mEq/L (ref 96–112)
GFR: 70.84 mL/min (ref >60.00)
GLUCOSE: 103 mg/dL — AB (ref 70–99)
Potassium: 4.3 mEq/L (ref 3.5–5.1)
SODIUM: 141 meq/L (ref 135–145)

## 2013-11-17 NOTE — Telephone Encounter (Signed)
New message      Returning a nurses call---pt had lab drawn today

## 2013-11-17 NOTE — Telephone Encounter (Signed)
This encounter was created in error - please disregard.

## 2013-11-19 ENCOUNTER — Telehealth: Payer: Self-pay | Admitting: Family Medicine

## 2013-11-19 MED ORDER — FLUTICASONE PROPIONATE 50 MCG/ACT NA SUSP
1.0000 | Freq: Every day | NASAL | Status: DC | PRN
Start: 2013-11-19 — End: 2014-07-11

## 2013-11-19 NOTE — Telephone Encounter (Signed)
AZELASTINE HCL SPRAY

## 2013-11-22 ENCOUNTER — Ambulatory Visit (INDEPENDENT_AMBULATORY_CARE_PROVIDER_SITE_OTHER): Payer: 59 | Admitting: Cardiology

## 2013-11-22 ENCOUNTER — Encounter: Payer: Self-pay | Admitting: Cardiology

## 2013-11-22 VITALS — BP 150/88 | HR 72 | Ht 65.0 in | Wt 324.0 lb

## 2013-11-22 DIAGNOSIS — I5032 Chronic diastolic (congestive) heart failure: Secondary | ICD-10-CM

## 2013-11-22 DIAGNOSIS — I10 Essential (primary) hypertension: Secondary | ICD-10-CM

## 2013-11-22 DIAGNOSIS — I2510 Atherosclerotic heart disease of native coronary artery without angina pectoris: Secondary | ICD-10-CM | POA: Insufficient documentation

## 2013-11-22 DIAGNOSIS — R0602 Shortness of breath: Secondary | ICD-10-CM

## 2013-11-22 MED ORDER — DILTIAZEM HCL ER COATED BEADS 120 MG PO CP24: 120.0000 mg | ORAL_CAPSULE | Freq: Every day | ORAL | Status: DC

## 2013-11-22 MED ORDER — FUROSEMIDE 40 MG PO TABS: 40.0000 mg | ORAL_TABLET | Freq: Every day | ORAL | Status: DC

## 2013-11-22 NOTE — Patient Instructions (Signed)
Your physician has recommended you make the following change in your medication: INCREASE LASIX TO 40 MG ONCE A DAY; BEGIN CARDIZEM CD [place]  Your physician recommends that you return for lab work in: [place]. (BMET)  Please make an appointment to return for a blood pressure check with the nurse for in 1 week.  Your physician recommends that you schedule a follow-up appointment in: [employer].

## 2013-11-22 NOTE — Progress Notes (Signed)
[address], [place], [place]  [address] Phone: [phone] Fax:  [phone]  Date:  11/22/2013   ID:  [name], DOB 1951-01-02, MRN [id number]  PCP:  Penni Homans, MD  Cardiologist:  Fransico Him, MD     History of Present Illness:  [name] is a 63 y.o. female with a history of HTN and dyslipidemia who presents today for followup. She saw me recently with complaints of chest pressure and SOB when at work as well as palpitations. Both her dad and brother have a history of CAD. She has been under a lot of stress. Her Dad was killed and she had to moved in with her mom which has caused her a lot of stress. She underwent recent nuclear stress test which showed small defect in very distal anteroseptal and apical wall that improves at rest. Otherwise normal perfusion and felt to possibly represent some shifting soft tissue (breast) but could not exclude trivial ischemia. Heart monitor showed nonsustained atrial tachycardia.  She continued to have SOB and underwent cardiac cath which revealed nonobstructive ASCAD with 20% mid LAD, 2o% prox left circ and 20% ostial RCA with elevated LVEDP.  She was started on Lasix and presents back today for followup.  She says that she has noticed a big difference after going on Lasix.  She still has occasional palpitations.  She says that her LE edema has significantly improved.  She says that she noticed the biggest improvement when she was on Lasix 40mg  daily and increased some after decreasing to 20mg  daily.     Wt Readings from Last 3 Encounters:  11/22/13 324 lb (146.965 kg)  11/08/13 315 lb (142.883 kg)  11/08/13 315 lb (142.883 kg)     Past Medical History  Diagnosis Date  . GERD (gastroesophageal reflux disease)   . Anxiety   . Hyperlipidemia   . Hypertension   . Hx of colonic polyps   . Kidney stones   . Ventral hernia   . Chicken pox as a child  . Measles as a child  . Mumps as a child  . Obesity   .  Vasomotor rhinitis 04/05/2012  . Sleep apnea 04/05/2012  . Anemia 04/05/2012  . Elevated LFTs 04/05/2012  . OA (osteoarthritis) of knee 04/05/2012  . Tinea corporis 02/23/2013  . Preventative health care 09/05/2013    Current Outpatient Prescriptions  Medication Sig Dispense Refill  . aspirin 81 MG tablet Take 81 mg by mouth daily.        . cetirizine (ZYRTEC) 10 MG tablet Take 1 tablet (10 mg total) by mouth daily.  90 tablet  3  . clotrimazole-betamethasone (LOTRISONE) cream Apply 1 application topically 2 (two) times daily as needed (for rash).      . diclofenac (VOLTAREN) 75 MG EC tablet Take 75 mg by mouth 2 (two) times daily.      Marland Kitchen escitalopram (LEXAPRO) 20 MG tablet Take 20 mg by mouth daily. TAKE 1 TABLET BY MOUTH DAILY.      . fluticasone (FLONASE) 50 MCG/ACT nasal spray Place 1 spray into both nostrils daily as needed for allergies.  16 g  1  . furosemide (LASIX) 20 MG tablet 20mg  2 tablets daily for 3 days then 1 tablet daily  96 tablet  3  . Glucosamine HCl (GLUCOSAMINE PO) Take 1 tablet by mouth daily.      Marland Kitchen losartan (COZAAR) 100 MG tablet Take 1 tablet (100 mg total) by mouth daily.  90 tablet  3  . nebivolol (BYSTOLIC) 10 MG tablet Take 1 tablet (10 mg total) by mouth daily.  90 tablet  3  . omega-3 acid ethyl esters (LOVAZA) 1 G capsule Take 1 g by mouth daily.      . pantoprazole (PROTONIX) 40 MG tablet Take 1 tablet (40 mg total) by mouth 2 (two) times daily.  180 tablet  3  . Polyethyl Glycol-Propyl Glycol (SYSTANE OP) Apply 1 drop to eye 2 (two) times daily as needed (for dry eyes).      . simvastatin (ZOCOR) 40 MG tablet Take 1 tablet (40 mg total) by mouth at bedtime.  90 tablet  3   No current facility-administered medications for this visit.    Allergies:   No Known Allergies  Social History:  The patient  reports that she has never smoked. She has never used smokeless tobacco. She reports that she does not drink alcohol or use illicit drugs.   Family History:   The patient's family history includes Alcohol abuse in her paternal grandfather; Arthritis in her father; Cancer in her maternal aunt and maternal grandmother; Coronary artery disease in her brother and father; Heart attack in her maternal grandfather; Heart attack (age of onset: 58) in her father; Heart disease in her brother; Hyperlipidemia in her mother; Hypertension in her father; Thyroid disease in her mother.   ROS:  Please see the history of present illness.      All other systems reviewed and negative.   PHYSICAL EXAM: VS:  BP 150/88  Pulse 72  Ht 5\' 5"  (1.651 m)  Wt 324 lb (146.965 kg)  BMI 53.92 kg/m2 Well nourished, well developed, in no acute distress HEENT: normal Neck: no JVD Cardiac:  normal S1, S2; RRR; no murmur Lungs:  clear to auscultation bilaterally, no wheezing, rhonchi or rales Abd: soft, nontender, no hepatomegaly Ext: no edema Skin: warm and dry Neuro:  CNs 2-12 intact, no focal abnormalities noted      ASSESSMENT AND PLAN:  1. Chronic diastolic CHF - symptoms much improved after going on Lasix although she was better on 40mg  daily - increase Lasix to 40mg  daily - BMET in 1 week 2. HTN with borderline control - continue Bystolic - start Cardizem CD 120mg  daily 3. SOB secondary to #1 - improved on Lasix  4.  Nonsustained atrial tachycardia - still with some palpitations. - starting Cardizem  Followup with nurse in 1 week for BP check  Followup with me in 3 months  Signed, Fransico Him, MD 11/22/2013 10:41 AM

## 2013-11-29 ENCOUNTER — Other Ambulatory Visit (INDEPENDENT_AMBULATORY_CARE_PROVIDER_SITE_OTHER): Payer: 59

## 2013-11-29 ENCOUNTER — Ambulatory Visit: Payer: 59 | Admitting: Family Medicine

## 2013-11-29 ENCOUNTER — Ambulatory Visit (INDEPENDENT_AMBULATORY_CARE_PROVIDER_SITE_OTHER): Payer: 59

## 2013-11-29 VITALS — BP 143/85 | HR 54 | Ht 65.0 in | Wt 322.0 lb

## 2013-11-29 DIAGNOSIS — I10 Essential (primary) hypertension: Secondary | ICD-10-CM

## 2013-11-29 DIAGNOSIS — I5032 Chronic diastolic (congestive) heart failure: Secondary | ICD-10-CM

## 2013-11-29 LAB — BASIC METABOLIC PANEL
BUN: 16 mg/dL (ref 6–23)
CHLORIDE: 104 meq/L (ref 96–112)
CO2: 30 mEq/L (ref 19–32)
Calcium: 9.4 mg/dL (ref 8.4–10.5)
Creatinine, Ser: 0.8 mg/dL (ref 0.4–1.2)
GFR: 74.83 mL/min (ref >60.00)
Glucose, Bld: 125 mg/dL — ABNORMAL HIGH (ref 70–99)
Potassium: 3.8 mEq/L (ref 3.5–5.1)
Sodium: 139 mEq/L (ref 135–145)

## 2013-11-29 NOTE — Progress Notes (Signed)
**Note De-Identified [name] Peaden Obfuscation** 1.) Reason for visit: BP check  2.) Name of MD requesting visit: Dr [name]  3.) H&P: increase lasix to 40 mg qd and start taking Cardizem CD 120 mg daily  4.) ROS related to problem: no complaints  5.) Assessment and plan per MD: will forward BP reading to Dr [name] and f/u with her in 3 months  Note reviewed and agree with above. BP much improved on Cardizem.  Fransico Him, MD 11/29/2013

## 2013-11-30 ENCOUNTER — Telehealth: Payer: Self-pay | Admitting: Cardiology

## 2013-11-30 NOTE — Telephone Encounter (Addendum)
Pt notified of bmet results.

## 2013-11-30 NOTE — Telephone Encounter (Signed)
New message ° ° ° °Returning Amy's call °

## 2013-12-01 ENCOUNTER — Ambulatory Visit (INDEPENDENT_AMBULATORY_CARE_PROVIDER_SITE_OTHER): Payer: 59 | Admitting: Pulmonary Disease

## 2013-12-01 ENCOUNTER — Encounter: Payer: Self-pay | Admitting: Pulmonary Disease

## 2013-12-01 VITALS — BP 124/82 | HR 63 | Ht 65.0 in | Wt 324.0 lb

## 2013-12-01 DIAGNOSIS — G4733 Obstructive sleep apnea (adult) (pediatric): Secondary | ICD-10-CM

## 2013-12-01 NOTE — Progress Notes (Signed)
Chief Complaint  Patient presents with  . Follow-up    review sleep study results.      History of Present Illness: [name] is a 63 y.o. female with OSA.  She is here to review her sleep study.  TESTS: HST 11/05/13 >> AHI 68.1, SaO2 low 62%.  [name]  has a past medical history of GERD (gastroesophageal reflux disease); Anxiety; Hyperlipidemia; Hypertension; colonic polyps; Kidney stones; Ventral hernia; Chicken pox (as a child); Measles (as a child); Mumps (as a child); Obesity; Vasomotor rhinitis (04/05/2012); Sleep apnea (04/05/2012); Anemia (04/05/2012); Elevated LFTs (04/05/2012); OA (osteoarthritis) of knee (04/05/2012); Tinea corporis (02/23/2013); and Preventative health care (09/05/2013).  [name]  has past surgical history that includes Cholecystectomy; Meniscus repair (2009); Total knee arthroplasty (2011); Knee arthroscopy (dec 2011); Mouth surgery; Hernia repair (02/08/11); Cesarean section; Wisdom tooth extraction (2000); and Cardiac catheterization.  Prior to Admission medications   Medication Sig Start Date End Date Taking? Authorizing Provider  aspirin 81 MG tablet Take 81 mg by mouth daily.     Yes Historical Provider, MD  cetirizine (ZYRTEC) 10 MG tablet Take 1 tablet (10 mg total) by mouth daily. 02/23/13  Yes [name], MD  clotrimazole-betamethasone (LOTRISONE) cream Apply 1 application topically 2 (two) times daily as needed (for rash). 02/23/13  Yes [name], MD  diclofenac (VOLTAREN) 75 MG EC tablet Take 75 mg by mouth 2 (two) times daily.   Yes Historical Provider, MD  diltiazem (CARDIZEM CD) 120 MG 24 hr capsule Take 1 capsule (120 mg total) by mouth daily. 11/22/13  Yes Sueanne Margarita, MD  escitalopram (LEXAPRO) 20 MG tablet Take 20 mg by mouth daily. TAKE 1 TABLET BY MOUTH DAILY. 02/23/13  Yes [name], MD  fluticasone (FLONASE) 50 MCG/ACT nasal spray Place 1 spray into both nostrils daily as needed for allergies. 11/19/13  Yes  [name], MD  furosemide (LASIX) 40 MG tablet Take 1 tablet (40 mg total) by mouth daily. 11/22/13  Yes Sueanne Margarita, MD  Glucosamine HCl (GLUCOSAMINE PO) Take 1 tablet by mouth daily.   Yes Historical Provider, MD  losartan (COZAAR) 100 MG tablet Take 1 tablet (100 mg total) by mouth daily. 11/10/13  Yes Sueanne Margarita, MD  nebivolol (BYSTOLIC) 10 MG tablet Take 1 tablet (10 mg total) by mouth daily. 02/23/13  Yes [name], MD  omega-3 acid ethyl esters (LOVAZA) 1 G capsule Take 1 g by mouth daily.   Yes Historical Provider, MD  pantoprazole (PROTONIX) 40 MG tablet Take 1 tablet (40 mg total) by mouth 2 (two) times daily. 10/19/13  Yes Sueanne Margarita, MD  Polyethyl Glycol-Propyl Glycol (SYSTANE OP) Apply 1 drop to eye 2 (two) times daily as needed (for dry eyes).   Yes Historical Provider, MD  simvastatin (ZOCOR) 40 MG tablet Take 1 tablet (40 mg total) by mouth at bedtime. 02/23/13  Yes [name], MD    No Known Allergies   Physical Exam:  General - No distress ENT - No sinus tenderness, no oral exudate, no LAN, MP 3, 2+ tonsils, elongated uvula Cardiac - s1s2 regular, no murmur Chest - No wheeze/rales/dullness Back - No focal tenderness Abd - Soft, non-tender Ext - No edema Neuro - Normal strength Skin - No rashes Psych - normal mood, and behavior   Assessment/Plan:  Chesley Mires, MD [facility]/Critical Care/Sleep Pager:  [phone]

## 2013-12-01 NOTE — Patient Instructions (Signed)
Will arrange for CPAP set up at home Follow up in 2 months 

## 2013-12-01 NOTE — Assessment & Plan Note (Signed)
She has severe sleep apnea.  I have reviewed the recent sleep study results with the patient.  We discussed how sleep apnea can affect various health problems including risks for hypertension, cardiovascular disease, and diabetes.  We also discussed how sleep disruption can increase risks for accident, such as while driving.  Weight loss as a means of improving sleep apnea was also reviewed.  Additional treatment options discussed were CPAP therapy, oral appliance, and surgical intervention.  Will arrange for auto CPAP set up.

## 2013-12-20 ENCOUNTER — Encounter: Payer: Self-pay | Admitting: Physician Assistant

## 2013-12-20 ENCOUNTER — Ambulatory Visit (INDEPENDENT_AMBULATORY_CARE_PROVIDER_SITE_OTHER): Payer: 59 | Admitting: Physician Assistant

## 2013-12-20 ENCOUNTER — Ambulatory Visit (HOSPITAL_BASED_OUTPATIENT_CLINIC_OR_DEPARTMENT_OTHER)
Admission: RE | Admit: 2013-12-20 | Discharge: 2013-12-20 | Disposition: A | Discharge status: Home / Self Care | Payer: 59 | Source: Ambulatory Visit | Attending: Physician Assistant | Admitting: Physician Assistant

## 2013-12-20 VITALS — BP 136/90 | HR 68 | Temp 98.3°F | Resp 20 | Ht 65.0 in | Wt 327.5 lb

## 2013-12-20 DIAGNOSIS — R05 Cough: Secondary | ICD-10-CM | POA: Insufficient documentation

## 2013-12-20 DIAGNOSIS — J019 Acute sinusitis, unspecified: Secondary | ICD-10-CM | POA: Insufficient documentation

## 2013-12-20 DIAGNOSIS — R062 Wheezing: Secondary | ICD-10-CM

## 2013-12-20 DIAGNOSIS — R059 Cough, unspecified: Secondary | ICD-10-CM | POA: Insufficient documentation

## 2013-12-20 MED ORDER — IPRATROPIUM-ALBUTEROL 0.5-2.5 (3) MG/3ML IN SOLN: 3.0000 mL | RESPIRATORY_TRACT | Status: DC

## 2013-12-20 MED ORDER — LEVOFLOXACIN 750 MG PO TABS: 750.0000 mg | ORAL_TABLET | Freq: Every day | ORAL | Status: DC

## 2013-12-20 MED ORDER — ALBUTEROL SULFATE HFA 108 (90 BASE) MCG/ACT IN AERS: 2.0000 | INHALATION_SPRAY | Freq: Four times a day (QID) | RESPIRATORY_TRACT | Status: DC | PRN

## 2013-12-20 MED ORDER — IPRATROPIUM-ALBUTEROL 0.5-2.5 (3) MG/3ML IN SOLN
3.0000 mL | Freq: Once | RESPIRATORY_TRACT | Status: AC
  Administered 2013-12-20: 3 mL via RESPIRATORY_TRACT

## 2013-12-20 NOTE — Assessment & Plan Note (Signed)
Rx Levaquin.  Increase fluids.  Rest.  Saline nasal spray.  Delsym for cough. Humidifier in bedroom.  Continue CPAP machine.

## 2013-12-20 NOTE — Assessment & Plan Note (Signed)
Duoneb given.  Repeat lung exam much improved.  O2 sats at 98%.  Rx Albuterol inhaler.  Will obtain CXR to r/o pneumonia.

## 2013-12-20 NOTE — Patient Instructions (Signed)
Please take Levaquin as directed.  Increase your fluid intake.  Rest.  Use saline nasal spray.  Take Delsym for cough.  Use Albuterol (see instructions below) as directed. Continue CPAP machine at night.  I want you to go to the [facility] for a chest x-ray.  I will call you with your results.  Call or return to clinic if symptoms are not improving within 24-48 hours.   Metered Dose Inhaler (No Spacer Used) Inhaled medicines are the basis of asthma treatment and other breathing problems. Inhaled medicine can only be effective if used properly. Good technique assures that the medicine reaches the lungs. Metered dose inhalers (MDIs) are used to deliver a variety of inhaled medicines. These include quick relief or rescue medicines (such as bronchodilators) and controller medicines (such as corticosteroids). The medicine is delivered by pushing down on a metal canister to release a set amount of spray. If you are using different kinds of inhalers, use your quick relief medicine to open the airways 10-15 minutes before using a steroid if instructed to do so by your health care provider. If you are unsure which inhalers to use and the order of using them, ask your health care provider, nurse, or respiratory therapist. HOW TO USE THE INHALER 1. Remove cap from inhaler. 2. If you are using the inhaler for the first time, you will need to prime it. Shake the inhaler for 5 seconds and release four puffs into the air, away from your face. Ask your health care provider or pharmacist if you have questions about priming your inhaler. 3. Shake inhaler for 5 seconds before each breath in (inhalation). 4. Position the inhaler so that the top of the canister faces up. 5. Put your index finger on the top of the medicine canister. Your thumb supports the bottom of the inhaler. 6. Open your mouth. 7. Either place the inhaler between your teeth and place your lips tightly around the mouthpiece, or hold the inhaler 1-2 inches  away from your open mouth. If you are unsure of which technique to use, ask your health care provider. 8. Breathe out (exhale) normally and as completely as possible. 9. Press the canister down with the index finger to release the medicine. 10. At the same time as the canister is pressed, inhale deeply and slowly until the lungs are completely filled. This should take 4-6 seconds. Keep your tongue down. 11. Hold the medicine in your lungs for up to 5-10 seconds (10 seconds is best). This helps the medicine get into the small airways of your lungs. 12. Breathe out slowly, through pursed lips. Whistling is an example of pursed lips. 13. Wait at least 1 minute between puffs. Continue with the above steps until you have taken the number of puffs your health care provider has ordered. Do not use the inhaler more than your health care provider directs you to. 14. Replace cap on inhaler. 15. Follow the directions from your health care provider or the inhaler insert for cleaning the inhaler. If you are using a steroid inhaler, rinse your mouth with water after your last puff, gargle, and spit out the water. Do not swallow the water. AVOID:  Inhaling before or after starting the spray of medicine. It takes practice to coordinate your breathing with triggering the spray.  Inhaling through the nose (rather than the mouth) when triggering the spray. HOW TO DETERMINE IF YOUR INHALER IS FULL OR NEARLY EMPTY You cannot know when an inhaler is empty by shaking  it. A few inhalers are now being made with dose counters. Ask your health care provider for a prescription that has a dose counter if you feel you need that extra help. If your inhaler does not have a counter, ask your health care provider to help you determine the date you need to refill your inhaler. Write the refill date on a calendar or your inhaler canister. Refill your inhaler 7-10 days before it runs out. Be sure to keep an adequate supply of medicine.  This includes making sure it is not expired, and you have a spare inhaler.  SEEK MEDICAL CARE IF:   Symptoms are only partially relieved with your inhaler.  You are having trouble using your inhaler.  You experience some increase in phlegm. SEEK IMMEDIATE MEDICAL CARE IF:   You feel little or no relief with your inhalers. You are still wheezing and are feeling shortness of breath or tightness in your chest or both.  You have dizziness, headaches, or fast heart rate.  You have chills, fever, or night sweats.  There is a noticeable increase in phlegm production, or there is blood in the phlegm. MAKE SURE YOU:  Understand these instructions.  Will watch your condition.  Will get help right away if you are not doing well or get worse. Document Released: 04/07/2007 Document Revised: 06/15/2013 Document Reviewed: 11/26/2012 [facility] Patient Information 2015 [place], [place]. This information is not intended to replace advice given to you by your health care provider. Make sure you discuss any questions you have with your health care provider.

## 2013-12-20 NOTE — Progress Notes (Signed)
Pre visit review using our clinic review tool, if applicable. No additional management support is needed unless otherwise documented below in the visit note/SLS  

## 2013-12-20 NOTE — Progress Notes (Signed)
Patient presents to clinic today c/o 2 weeks of sinus pressure, sinus pain and nasal congestion.  Patient now with tooth pain and non-productive cough.  Endorses wheezing over the past few days.  Denies hx of asthma.  Denies smoking or exposure to cigarette smoke.  Denies pleuritic chest pain.  Endorses history of allergies.  Past Medical History  Diagnosis Date  . GERD (gastroesophageal reflux disease)   . Anxiety   . Hyperlipidemia   . Hypertension   . Hx of colonic polyps   . Kidney stones   . Ventral hernia   . Chicken pox as a child  . Measles as a child  . Mumps as a child  . Obesity   . Vasomotor rhinitis 04/05/2012  . Sleep apnea 04/05/2012  . Anemia 04/05/2012  . Elevated LFTs 04/05/2012  . OA (osteoarthritis) of knee 04/05/2012  . Tinea corporis 02/23/2013  . Preventative health care 09/05/2013    Current Outpatient Prescriptions on File Prior to Visit  Medication Sig Dispense Refill  . aspirin 81 MG tablet Take 81 mg by mouth daily.        . cetirizine (ZYRTEC) 10 MG tablet Take 1 tablet (10 mg total) by mouth daily.  90 tablet  3  . clotrimazole-betamethasone (LOTRISONE) cream Apply 1 application topically 2 (two) times daily as needed (for rash).      . diclofenac (VOLTAREN) 75 MG EC tablet Take 75 mg by mouth 2 (two) times daily.      Marland Kitchen diltiazem (CARDIZEM CD) 120 MG 24 hr capsule Take 1 capsule (120 mg total) by mouth daily.  90 capsule  3  . escitalopram (LEXAPRO) 20 MG tablet Take 20 mg by mouth daily. TAKE 1 TABLET BY MOUTH DAILY.      . fluticasone (FLONASE) 50 MCG/ACT nasal spray Place 1 spray into both nostrils daily as needed for allergies.  16 g  1  . furosemide (LASIX) 40 MG tablet Take 1 tablet (40 mg total) by mouth daily.  90 tablet  3  . Glucosamine HCl (GLUCOSAMINE PO) Take 1 tablet by mouth daily.      Marland Kitchen losartan (COZAAR) 100 MG tablet Take 1 tablet (100 mg total) by mouth daily.  90 tablet  3  . nebivolol (BYSTOLIC) 10 MG tablet Take 1 tablet (10 mg  total) by mouth daily.  90 tablet  3  . omega-3 acid ethyl esters (LOVAZA) 1 G capsule Take 1 g by mouth daily.      . pantoprazole (PROTONIX) 40 MG tablet Take 1 tablet (40 mg total) by mouth 2 (two) times daily.  180 tablet  3  . Polyethyl Glycol-Propyl Glycol (SYSTANE OP) Apply 1 drop to eye 2 (two) times daily as needed (for dry eyes).      . simvastatin (ZOCOR) 40 MG tablet Take 1 tablet (40 mg total) by mouth at bedtime.  90 tablet  3   No current facility-administered medications on file prior to visit.    No Known Allergies  Family History  Problem Relation Age of Onset  . Thyroid disease Mother   . Hyperlipidemia Mother   . Heart attack Father 30  . Hypertension Father   . Arthritis Father     RA  . Coronary artery disease Father   . Coronary artery disease Brother   . Heart disease Brother   . Cancer Maternal Aunt     colon  . Cancer Maternal Grandmother     colon  . Heart attack Maternal  Grandfather   . Alcohol abuse Paternal Grandfather     History   Social History  . Marital Status: Divorced    Spouse Name: N/A    Number of Children: N/A  . Years of Education: N/A   Social History Main Topics  . Smoking status: Never Smoker   . Smokeless tobacco: Never Used  . Alcohol Use: No  . Drug Use: No  . Sexual Activity: No   Other Topics Concern  . None   Social History Narrative  . None   Review of Systems - See HPI.  All other ROS are negative.  BP 136/90  Pulse 68  Temp(Src) 98.3 F (36.8 C) (Oral)  Resp 20  Ht 5\' 5"  (1.651 m)  Wt 327 lb 8 oz (148.553 kg)  BMI 54.50 kg/m2  SpO2 98%  Physical Exam  Vitals reviewed. Constitutional: She is oriented to person, place, and time and well-developed, well-nourished, and in no distress.  HENT:  Head: Normocephalic and atraumatic.  Right Ear: External ear normal.  Left Ear: External ear normal.  Nose: Nose normal.  Mouth/Throat: Oropharynx is clear and moist. No oropharyngeal exudate.  TM within  normal limits bilaterally.  + TTP of sinuses noted on examination.  Eyes: Conjunctivae are normal. Pupils are equal, round, and reactive to light.  Neck: Neck supple.  Cardiovascular: Normal rate, regular rhythm, normal heart sounds and intact distal pulses.   Pulmonary/Chest: No respiratory distress. She has wheezes. She has no rales. She exhibits no tenderness.  Lymphadenopathy:    She has no cervical adenopathy.  Neurological: She is alert and oriented to person, place, and time.  Skin: Skin is warm and dry. No rash noted.  Psychiatric: Affect normal.    Recent Results (from the past 2160 hour(s))  CBC WITH DIFFERENTIAL     Status: None   Collection Time    11/03/13  8:53 AM      Result Value Ref Range   WBC 7.8  4.0 - 10.5 K/uL   RBC 4.46  3.87 - 5.11 Mil/uL   Hemoglobin 13.1  12.0 - 15.0 g/dL   HCT 39.8  36.0 - 46.0 %   MCV 89.3  78.0 - 100.0 fl   MCHC 33.0  30.0 - 36.0 g/dL   RDW 14.5  11.5 - 15.5 %   Platelets 260.0  150.0 - 400.0 K/uL   Neutrophils Relative % 68.8  43.0 - 77.0 %   Lymphocytes Relative 24.4  12.0 - 46.0 %   Monocytes Relative 5.7  3.0 - 12.0 %   Eosinophils Relative 0.8  0.0 - 5.0 %   Basophils Relative 0.3  0.0 - 3.0 %   Neutro Abs 5.4  1.4 - 7.7 K/uL   Lymphs Abs 1.9  0.7 - 4.0 K/uL   Monocytes Absolute 0.4  0.1 - 1.0 K/uL   Eosinophils Absolute 0.1  0.0 - 0.7 K/uL   Basophils Absolute 0.0  0.0 - 0.1 K/uL  BASIC METABOLIC PANEL     Status: Abnormal   Collection Time    11/03/13  8:53 AM      Result Value Ref Range   Sodium 140  135 - 145 mEq/L   Potassium 4.5  3.5 - 5.1 mEq/L   Chloride 102  96 - 112 mEq/L   CO2 31  19 - 32 mEq/L   Glucose, Bld 106 (*) 70 - 99 mg/dL   BUN 16  6 - 23 mg/dL   Creatinine, Ser 0.8  0.4 -  1.2 mg/dL   Calcium 9.5  8.4 - 10.5 mg/dL   GFR 72.79  >60.00 mL/min  BRAIN NATRIURETIC PEPTIDE     Status: None   Collection Time    11/03/13  8:53 AM      Result Value Ref Range   Pro B Natriuretic peptide (BNP) 97.0  0.0 -  100.0 pg/mL  PROTIME-INR     Status: None   Collection Time    11/08/13  5:58 AM      Result Value Ref Range   Prothrombin Time 12.4  11.6 - 15.2 seconds   INR 0.94  0.00 - 1.49  POCT I-STAT 3, VENOUS BLOOD GAS (G3P V)     Status: Abnormal   Collection Time    11/08/13  8:19 AM      Result Value Ref Range   pH, Ven 7.381 (*) 7.250 - 7.300   pCO2, Ven 48.9  45.0 - 50.0 mmHg   pO2, Ven 37.0  30.0 - 45.0 mmHg   Bicarbonate 29.0 (*) 20.0 - 24.0 mEq/L   TCO2 30  0 - 100 mmol/L   O2 Saturation 69.0     Acid-Base Excess 3.0 (*) 0.0 - 2.0 mmol/L   Sample type VENOUS    POCT I-STAT 3, ART BLOOD GAS (G3+)     Status: Abnormal   Collection Time    11/08/13  8:43 AM      Result Value Ref Range   pH, Arterial 7.410  7.350 - 7.450   pCO2 arterial 46.0 (*) 35.0 - 45.0 mmHg   pO2, Arterial 67.0 (*) 80.0 - 100.0 mmHg   Bicarbonate 29.1 (*) 20.0 - 24.0 mEq/L   TCO2 31  0 - 100 mmol/L   O2 Saturation 93.0     Acid-Base Excess 4.0 (*) 0.0 - 2.0 mmol/L   Sample type ARTERIAL    POCT ACTIVATED CLOTTING TIME     Status: None   Collection Time    11/08/13  8:43 AM      Result Value Ref Range   Activated Clotting Time 627    BASIC METABOLIC PANEL     Status: Abnormal   Collection Time    11/17/13  8:37 AM      Result Value Ref Range   Sodium 141  135 - 145 mEq/L   Potassium 4.3  3.5 - 5.1 mEq/L   Chloride 104  96 - 112 mEq/L   CO2 28  19 - 32 mEq/L   Glucose, Bld 103 (*) 70 - 99 mg/dL   BUN 21  6 - 23 mg/dL   Creatinine, Ser 0.9  0.4 - 1.2 mg/dL   Calcium 9.4  8.4 - 10.5 mg/dL   GFR 70.84  >60.00 mL/min  BASIC METABOLIC PANEL     Status: Abnormal   Collection Time    11/29/13  9:22 AM      Result Value Ref Range   Sodium 139  135 - 145 mEq/L   Potassium 3.8  3.5 - 5.1 mEq/L   Chloride 104  96 - 112 mEq/L   CO2 30  19 - 32 mEq/L   Glucose, Bld 125 (*) 70 - 99 mg/dL   BUN 16  6 - 23 mg/dL   Creatinine, Ser 0.8  0.4 - 1.2 mg/dL   Calcium 9.4  8.4 - 10.5 mg/dL   GFR 74.83  >60.00  mL/min   Assessment/Plan: Wheezing Duoneb given.  Repeat lung exam much improved.  O2 sats at 98%.  Rx  Albuterol inhaler.  Will obtain CXR to r/o pneumonia.  Acute sinusitis with symptoms > 10 days Rx Levaquin.  Increase fluids.  Rest.  Saline nasal spray.  Delsym for cough. Humidifier in bedroom.  Continue CPAP machine.

## 2013-12-20 NOTE — Addendum Note (Signed)
Addended by: Rockwell Germany on: 12/20/2013 05:58 PM   Modules accepted: Orders

## 2013-12-21 ENCOUNTER — Encounter: Payer: Self-pay | Admitting: Family Medicine

## 2013-12-31 ENCOUNTER — Encounter: Payer: Self-pay | Admitting: Family Medicine

## 2013-12-31 ENCOUNTER — Ambulatory Visit (INDEPENDENT_AMBULATORY_CARE_PROVIDER_SITE_OTHER): Payer: 59 | Admitting: Family Medicine

## 2013-12-31 VITALS — BP 148/100 | HR 79 | Temp 98.6°F | Ht 65.0 in | Wt 326.0 lb

## 2013-12-31 DIAGNOSIS — R7309 Other abnormal glucose: Secondary | ICD-10-CM

## 2013-12-31 DIAGNOSIS — J0100 Acute maxillary sinusitis, unspecified: Secondary | ICD-10-CM

## 2013-12-31 DIAGNOSIS — J019 Acute sinusitis, unspecified: Secondary | ICD-10-CM

## 2013-12-31 DIAGNOSIS — E1165 Type 2 diabetes mellitus with hyperglycemia: Secondary | ICD-10-CM

## 2013-12-31 DIAGNOSIS — I10 Essential (primary) hypertension: Secondary | ICD-10-CM

## 2013-12-31 DIAGNOSIS — J0101 Acute recurrent maxillary sinusitis: Secondary | ICD-10-CM

## 2013-12-31 DIAGNOSIS — K219 Gastro-esophageal reflux disease without esophagitis: Secondary | ICD-10-CM

## 2013-12-31 DIAGNOSIS — E785 Hyperlipidemia, unspecified: Secondary | ICD-10-CM

## 2013-12-31 DIAGNOSIS — R739 Hyperglycemia, unspecified: Secondary | ICD-10-CM

## 2013-12-31 DIAGNOSIS — G4733 Obstructive sleep apnea (adult) (pediatric): Secondary | ICD-10-CM

## 2013-12-31 HISTORY — DX: Type 2 diabetes mellitus with hyperglycemia: E11.65

## 2013-12-31 LAB — RENAL FUNCTION PANEL
Albumin: 4 g/dL (ref 3.5–5.2)
BUN: 12 mg/dL (ref 6–23)
CHLORIDE: 101 meq/L (ref 96–112)
CO2: 29 meq/L (ref 19–32)
Calcium: 9.6 mg/dL (ref 8.4–10.5)
Creat: 0.79 mg/dL (ref 0.50–1.10)
GLUCOSE: 102 mg/dL — AB (ref 70–99)
POTASSIUM: 4.8 meq/L (ref 3.5–5.3)
Phosphorus: 3.8 mg/dL (ref 2.3–4.6)
SODIUM: 140 meq/L (ref 135–145)

## 2013-12-31 LAB — CBC
HCT: 40.4 % (ref 36.0–46.0)
HEMOGLOBIN: 13.3 g/dL (ref 12.0–15.0)
MCH: 28.6 pg (ref 26.0–34.0)
MCHC: 32.9 g/dL (ref 30.0–36.0)
MCV: 86.9 fL (ref 78.0–100.0)
PLATELETS: 278 10*3/uL (ref 150–400)
RBC: 4.65 MIL/uL (ref 3.87–5.11)
RDW: 14.4 % (ref 11.5–15.5)
WBC: 7 10*3/uL (ref 4.0–10.5)

## 2013-12-31 LAB — HEPATIC FUNCTION PANEL
ALT: 25 U/L (ref 0–35)
AST: 25 U/L (ref 0–37)
Albumin: 4 g/dL (ref 3.5–5.2)
Alkaline Phosphatase: 80 U/L (ref 39–117)
BILIRUBIN DIRECT: 0.1 mg/dL (ref 0.0–0.3)
BILIRUBIN INDIRECT: 0.4 mg/dL (ref 0.2–1.2)
BILIRUBIN TOTAL: 0.5 mg/dL (ref 0.2–1.2)
Total Protein: 7.1 g/dL (ref 6.0–8.3)

## 2013-12-31 LAB — TSH: TSH: 0.557 u[IU]/mL (ref 0.350–4.500)

## 2013-12-31 LAB — LIPID PANEL
CHOL/HDL RATIO: 4 ratio
Cholesterol: 152 mg/dL (ref 0–200)
HDL: 38 mg/dL — ABNORMAL LOW (ref >39)
LDL Cholesterol: 79 mg/dL (ref 0–99)
Triglycerides: 177 mg/dL — ABNORMAL HIGH (ref <150)
VLDL: 35 mg/dL (ref 0–40)

## 2013-12-31 LAB — HEMOGLOBIN A1C
HEMOGLOBIN A1C: 5.9 % — AB (ref <5.7)
MEAN PLASMA GLUCOSE: 123 mg/dL — AB (ref <117)

## 2013-12-31 MED ORDER — CEFDINIR 300 MG PO CAPS: 300.0000 mg | ORAL_CAPSULE | Freq: Two times a day (BID) | ORAL | Status: DC

## 2013-12-31 NOTE — Assessment & Plan Note (Signed)
Avoid offending foods, start probiotics. Do not eat large meals in late evening and consider raising head of bed.  

## 2013-12-31 NOTE — Assessment & Plan Note (Signed)
Started on Cefdinir and Encouraged increased rest and hydration, add probiotics, zinc such as Coldeze or Xicam. Treat fevers as needed 

## 2013-12-31 NOTE — Progress Notes (Signed)
[name], female   DOB: 02-19-1951, 63 y.o.   MRN: [id number] [name] [id number] 1950-06-26 12/31/2013      Progress Note-Follow Up  Subjective  Chief Complaint  Chief Complaint  Patient presents with  . Follow-up    3 month    HPI  Patient is a 63 year old female in today for routine medical care. Doing much better on CPAP, sleeps better and eels more rested. No HA or malaise, has just been treated for a sinusitis with Levaquin which was helpful but now that it is done she is experiencing increased congestion, cough, wheeze, rhinorrhea. Underwent cardiac cath since last visit. Denies CP/palp/SOB/HA/congestion/fevers/GI or GU c/o. Taking meds as prescribed  Past Medical History  Diagnosis Date  . GERD (gastroesophageal reflux disease)   . Anxiety   . Hyperlipidemia   . Hypertension   . Hx of colonic polyps   . Kidney stones   . Ventral hernia   . Chicken pox as a child  . Measles as a child  . Mumps as a child  . Obesity   . Vasomotor rhinitis 04/05/2012  . Sleep apnea 04/05/2012  . Anemia 04/05/2012  . Elevated LFTs 04/05/2012  . OA (osteoarthritis) of knee 04/05/2012  . Tinea corporis 02/23/2013  . Preventative health care 09/05/2013    Past Surgical History  Procedure Laterality Date  . Cholecystectomy    . Meniscus repair  2009  . Total knee arthroplasty  2011    left  . Knee arthroscopy  dec 2011    right  . Mouth surgery      teeth implants  . Hernia repair  02/08/11    ventral hernia  . Cesarean section      X 3  . Wisdom tooth extraction  2000  . Cardiac catheterization      normal coroary arteries per patient    Family History  Problem Relation Age of Onset  . Thyroid disease Mother   . Hyperlipidemia Mother   . Heart attack Father 6  . Hypertension Father   . Arthritis Father     RA  . Coronary artery disease Father   . Coronary artery disease Brother   . Heart disease Brother   . Cancer Maternal Aunt     colon   . Cancer Maternal Grandmother     colon  . Heart attack Maternal Grandfather   . Alcohol abuse Paternal Grandfather     History   Social History  . Marital Status: Divorced    Spouse Name: N/A    Number of Children: N/A  . Years of Education: N/A   Occupational History  . Not on file.   Social History Main Topics  . Smoking status: Never Smoker   . Smokeless tobacco: Never Used  . Alcohol Use: No  . Drug Use: No  . Sexual Activity: No     Comment: lives with mother currently-stress   Other Topics Concern  . Not on file   Social History Narrative  . No narrative on file    Current Outpatient Prescriptions on File Prior to Visit  Medication Sig Dispense Refill  . albuterol (PROVENTIL HFA;VENTOLIN HFA) 108 (90 BASE) MCG/ACT inhaler Inhale 2 puffs into the lungs every 6 (six) hours as needed for wheezing or shortness of breath.  1 Inhaler  0  . aspirin 81 MG tablet Take 81 mg by mouth daily.        . cetirizine (ZYRTEC) 10 MG  tablet Take 1 tablet (10 mg total) by mouth daily.  90 tablet  3  . clotrimazole-betamethasone (LOTRISONE) cream Apply 1 application topically 2 (two) times daily as needed (for rash).      . diclofenac (VOLTAREN) 75 MG EC tablet Take 75 mg by mouth 2 (two) times daily.      Marland Kitchen diltiazem (CARDIZEM CD) 120 MG 24 hr capsule Take 1 capsule (120 mg total) by mouth daily.  90 capsule  3  . escitalopram (LEXAPRO) 20 MG tablet Take 20 mg by mouth daily. TAKE 1 TABLET BY MOUTH DAILY.      . fluticasone (FLONASE) 50 MCG/ACT nasal spray Place 1 spray into both nostrils daily as needed for allergies.  16 g  1  . furosemide (LASIX) 40 MG tablet Take 1 tablet (40 mg total) by mouth daily.  90 tablet  3  . Glucosamine HCl (GLUCOSAMINE PO) Take 1 tablet by mouth daily.      Marland Kitchen levofloxacin (LEVAQUIN) 750 MG tablet Take 1 tablet (750 mg total) by mouth daily.  7 tablet  0  . losartan (COZAAR) 100 MG tablet Take 1 tablet (100 mg total) by mouth daily.  90 tablet  3  .  nebivolol (BYSTOLIC) 10 MG tablet Take 1 tablet (10 mg total) by mouth daily.  90 tablet  3  . omega-3 acid ethyl esters (LOVAZA) 1 G capsule Take 1 g by mouth daily.      . pantoprazole (PROTONIX) 40 MG tablet Take 1 tablet (40 mg total) by mouth 2 (two) times daily.  180 tablet  3  . Polyethyl Glycol-Propyl Glycol (SYSTANE OP) Apply 1 drop to eye 2 (two) times daily as needed (for dry eyes).      . simvastatin (ZOCOR) 40 MG tablet Take 1 tablet (40 mg total) by mouth at bedtime.  90 tablet  3   No current facility-administered medications on file prior to visit.    No Known Allergies  Review of Systems  Review of Systems  Constitutional: Negative for fever and malaise/fatigue.  HENT: Positive for congestion.   Eyes: Negative for discharge.  Respiratory: Positive for cough, sputum production and wheezing. Negative for shortness of breath.   Cardiovascular: Negative for chest pain, palpitations and leg swelling.  Gastrointestinal: Negative for nausea, abdominal pain and diarrhea.  Genitourinary: Negative for dysuria.  Musculoskeletal: Negative for falls.  Skin: Negative for rash.  Neurological: Positive for headaches. Negative for loss of consciousness.  Endo/Heme/Allergies: Negative for polydipsia.  Psychiatric/Behavioral: Negative for depression and suicidal ideas. The patient is not nervous/anxious and does not have insomnia.     Objective  BP 148/100  Pulse 79  Temp(Src) 98.6 F (37 C) (Oral)  Ht 5\' 5"  (1.651 m)  Wt 326 lb (147.873 kg)  BMI 54.25 kg/m2  SpO2 92%  Physical Exam  Physical Exam  Constitutional: She is oriented to person, place, and time and well-developed, well-nourished, and in no distress. No distress.  HENT:  Head: Normocephalic and atraumatic.  Eyes: Conjunctivae are normal.  Neck: Neck supple. No thyromegaly present.  Cardiovascular: Normal rate, regular rhythm and normal heart sounds.   No murmur heard. Pulmonary/Chest: Effort normal and breath  sounds normal. She has no wheezes.  Abdominal: She exhibits no distension and no mass.  Musculoskeletal: She exhibits no edema.  Lymphadenopathy:    She has no cervical adenopathy.  Neurological: She is alert and oriented to person, place, and time.  Skin: Skin is warm and dry. No rash noted.  She is not diaphoretic.  Psychiatric: Memory, affect and judgment normal.    Lab Results  Component Value Date   TSH 0.997 09/02/2013   Lab Results  Component Value Date   WBC 7.8 11/03/2013   HGB 13.1 11/03/2013   HCT 39.8 11/03/2013   MCV 89.3 11/03/2013   PLT 260.0 11/03/2013   Lab Results  Component Value Date   CREATININE 0.8 11/29/2013   BUN 16 11/29/2013   NA 139 11/29/2013   K 3.8 11/29/2013   CL 104 11/29/2013   CO2 30 11/29/2013   Lab Results  Component Value Date   ALT 23 09/02/2013   AST 24 09/02/2013   ALKPHOS 70 09/02/2013   BILITOT 0.5 09/02/2013   Lab Results  Component Value Date   CHOL 159 09/02/2013   Lab Results  Component Value Date   HDL 34* 09/02/2013   Lab Results  Component Value Date   LDLCALC 98 09/02/2013   Lab Results  Component Value Date   TRIG 133 09/02/2013   Lab Results  Component Value Date   CHOLHDL 4.7 09/02/2013     Assessment & Plan  HYPERTENSION Improved on recheck somewhat, likely elevated due to acute illness. Will recheck at next visit  Acute sinusitis with symptoms > 10 days Started on Cefdinir and Encouraged increased rest and hydration, add probiotics, zinc such as Coldeze or Xicam. Treat fevers as needed  Obstructive sleep apnea Doing well on CPAP, using it nightly. Will continue with pulmonology.  GERD Avoid offending foods, start probiotics. Do not eat large meals in late evening and consider raising head of bed.   HYPERLIPIDEMIA Tolerating statin, encouraged heart healthy diet, avoid trans fats, minimize simple carbs and saturated fats. Increase exercise as tolerated  Hyperglycemia Check hgba1c, minimize simple carbs. Increase  exercise as tolerated. Continu

## 2013-12-31 NOTE — Assessment & Plan Note (Signed)
Doing well on CPAP, using it nightly. Will continue with pulmonology.

## 2013-12-31 NOTE — Progress Notes (Signed)
Pre visit review using our clinic review tool, if applicable. No additional management support is needed unless otherwise documented below in the visit note. 

## 2013-12-31 NOTE — Patient Instructions (Signed)
Encouraged increased rest and hydration, add probiotics, zinc such as Coldeze or Xicam. Treat fevers as needed.  Basic Carbohydrate Counting for Diabetes Mellitus Carbohydrate counting is a method for keeping track of the amount of carbohydrates you eat. Eating carbohydrates naturally increases the level of sugar (glucose) in your blood, so it is important for you to know the amount that is okay for you to have in every meal. Carbohydrate counting helps keep the level of glucose in your blood within normal limits. The amount of carbohydrates allowed is different for every person. A dietitian can help you calculate the amount that is right for you. Once you know the amount of carbohydrates you can have, you can count the carbohydrates in the foods you want to eat. Carbohydrates are found in the following foods:  Grains, such as breads and cereals.  Dried beans and soy products.  Starchy vegetables, such as potatoes, peas, and corn.  Fruit and fruit juices.  Milk and yogurt.  Sweets and snack foods, such as cake, cookies, candy, chips, soft drinks, and fruit drinks. CARBOHYDRATE COUNTING There are two ways to count the carbohydrates in your food. You can use either of the methods or a combination of both. Reading the "Nutrition Facts" on [place] The "Nutrition Facts" is an area that is included on the labels of almost all packaged food and beverages in the [place]. It includes the serving size of that food or beverage and information about the nutrients in each serving of the food, including the grams (g) of carbohydrate per serving.  Decide the number of servings of this food or beverage that you will be able to eat or drink. Multiply that number of servings by the number of grams of carbohydrate that is listed on the label for that serving. The total will be the amount of carbohydrates you will be having when you eat or drink this food or beverage. Learning Standard Serving Sizes of  Food When you eat food that is not packaged or does not include "Nutrition Facts" on the label, you need to measure the servings in order to count the amount of carbohydrates.A serving of most carbohydrate-rich foods contains about 15 g of carbohydrates. The following list includes serving sizes of carbohydrate-rich foods that provide 15 g ofcarbohydrate per serving:   1 slice of bread (1 oz) or 1 six-inch tortilla.    of a hamburger bun or English muffin.  4-6 crackers.   cup unsweetened dry cereal.    cup hot cereal.   cup rice or pasta.    cup mashed potatoes or  of a large baked potato.  1 cup fresh fruit or one small piece of fruit.    cup canned or frozen fruit or fruit juice.  1 cup milk.   cup plain fat-free yogurt or yogurt sweetened with artificial sweeteners.   cup cooked dried beans or starchy vegetable, such as peas, corn, or potatoes.  Decide the number of standard-size servings that you will eat. Multiply that number of servings by 15 (the grams of carbohydrates in that serving). For example, if you eat 2 cups of strawberries, you will have eaten 2 servings and 30 g of carbohydrates (2 servings x 15 g = 30 g). For foods such as soups and casseroles, in which more than one food is mixed in, you will need to count the carbohydrates in each food that is included. EXAMPLE OF CARBOHYDRATE COUNTING Sample Dinner  3 oz chicken breast.   cup  of brown rice.   cup of corn.  1 cup milk.   1 cup strawberries with sugar-free whipped topping.  Carbohydrate Calculation Step 1: Identify the foods that contain carbohydrates:   Rice.   Corn.   Milk.   Strawberries. Step 2:Calculate the number of servings eaten of each:   2 servings of rice.   1 serving of corn.   1 serving of milk.   1 serving of strawberries. Step 3: Multiply each of those number of servings by 15 g:   2 servings of rice x 15 g = 30 g.   1 serving of corn x 15 g  = 15 g.   1 serving of milk x 15 g = 15 g.   1 serving of strawberries x 15 g = 15 g. Step 4: Add together all of the amounts to find the total grams of carbohydrates eaten: 30 g + 15 g + 15 g + 15 g = 75 g. Document Released: 06/10/2005 Document Revised: 06/15/2013 Document Reviewed: 05/07/2013 [facility] Patient Information 2015 [place], [place]. This information is not intended to replace advice given to you by your health care provider. Make sure you discuss any questions you have with your health care provider.

## 2013-12-31 NOTE — Assessment & Plan Note (Signed)
Improved on recheck somewhat, likely elevated due to acute illness. Will recheck at next visit

## 2013-12-31 NOTE — Assessment & Plan Note (Signed)
Check hgba1c, minimize simple carbs. Increase exercise as tolerated. Continu

## 2013-12-31 NOTE — Assessment & Plan Note (Signed)
Tolerating statin, encouraged heart healthy diet, avoid trans fats, minimize simple carbs and saturated fats. Increase exercise as tolerated 

## 2014-01-03 ENCOUNTER — Telehealth: Payer: Self-pay | Admitting: Family Medicine

## 2014-01-03 NOTE — Telephone Encounter (Signed)
Relevant patient education assigned to patient using Emmi. ° °

## 2014-01-10 ENCOUNTER — Telehealth: Payer: Self-pay | Admitting: Family Medicine

## 2014-01-10 ENCOUNTER — Ambulatory Visit (INDEPENDENT_AMBULATORY_CARE_PROVIDER_SITE_OTHER): Payer: 59 | Admitting: Medical

## 2014-01-10 ENCOUNTER — Encounter: Payer: Self-pay | Admitting: Medical

## 2014-01-10 VITALS — BP 160/116 | HR 55 | Temp 98.4°F | Resp 18 | Ht 65.0 in | Wt 322.1 lb

## 2014-01-10 DIAGNOSIS — R062 Wheezing: Secondary | ICD-10-CM

## 2014-01-10 DIAGNOSIS — J019 Acute sinusitis, unspecified: Secondary | ICD-10-CM

## 2014-01-10 MED ORDER — BECLOMETHASONE DIPROPIONATE 80 MCG/ACT IN AERS: 1.0000 | INHALATION_SPRAY | Freq: Two times a day (BID) | RESPIRATORY_TRACT | Status: DC

## 2014-01-10 MED ORDER — CEFDINIR 300 MG PO CAPS: 300.0000 mg | ORAL_CAPSULE | Freq: Two times a day (BID) | ORAL | Status: DC

## 2014-01-10 NOTE — Assessment & Plan Note (Addendum)
Pt has ventolin rx. Advised to use. Add qvar rx 80 mcg. 1 inhalation po bid. If wheezing still then notify us and would add prednisone taper dose.

## 2014-01-10 NOTE — Telephone Encounter (Signed)
Pt scheduled at 2:30 to see Percell Miller

## 2014-01-10 NOTE — Progress Notes (Signed)
   Subjective:    [name], female    DOB: 05-Jun-1951, 63 y.o.   MRN: [id number]  HPI  Pt states prior provider she saw last considered giving her cefdinir.(On July 10th) Pt states she has had increasing wheezing.(Despite her using ventolin) Over weekend it was fairly severe. When she called earlier staff member on the phone could hear audible wheezing and advised her to come in. Pt has been taking mucinex. She notes sinus pressure, sore throat and some rt ear pain.    Review of Systems  Constitutional: Negative for fever, chills and fatigue.  HENT: Positive for congestion, ear pain, sinus pressure and sore throat.   Respiratory: Positive for cough, shortness of breath and wheezing.   Cardiovascular: Negative for chest pain and palpitations.  Genitourinary: Negative.   Musculoskeletal: Negative.   Neurological: Negative.        Objective:   Physical Exam  General  Mental Status - Alert. General Appearance - Well groomed. Not in acute distress.  Skin Rashes- No Rashes.  HEENT Head- Normal. Ear Auditory Canal - Left- Normal. Right - Normal.Tympanic Membrane- Left- Normal. Right- Normal. Eye Sclera/Conjunctiva- Left- Normal. Right- Normal. Nose & Sinuses Nasal Mucosa- Left- Not Boggy or Congested. Right- Not Boggy or Congested. (But bilateral maxillary sinus pressure to palpation) Mouth & Throat Lips: Upper Lip- Normal: no dryness, cracking, pallor, cyanosis, or vesicular eruption. Lower Lip-Normal: no dryness, cracking, pallor, cyanosis or vesicular eruption. Buccal Mucosa- Bilateral- No Aphthous ulcers. Oropharynx- No Discharge or Erythema. Tonsils: Characteristics- Bilateral- No Erythema or Congestion. Size/Enlargement- Bilateral- No enlargement. Discharge- bilateral-None.  Neck Neck- Supple. No Masses.   Chest and Lung Exam Auscultation: Breath Sounds:-Normal, clear even unlabored presently.  Cardiovascular Auscultation:Rythm- Regular.    Murmurs & Other Heart Sounds:Ausculatation of the heart reveal- No Murmurs.  Lymphatic Head & Neck General Head & Neck Lymphatics: Bilateral: Description- No Localized lymphadenopathy.  Lower ext- no pedal edema. Negative homans signs.         Assessment & Plan:  Note bp  Rechecked and 155/95 rt arm sitting ESPAC.

## 2014-01-10 NOTE — Telephone Encounter (Signed)
Patient called stating that she is not feeling any better since last visit and would like something called in.

## 2014-01-10 NOTE — Patient Instructions (Signed)
Pt advised to go ahead and start cefdinir rx. Continue ventolin and add qvar. If she worses with wheezing notify us and could add tapered days of prednisone. Follow up 7-10 days any persisting symptoms or prn worsening symptoms.  Bronchospasm A bronchospasm is when the tubes that carry air in and out of your lungs (airways) spasm or tighten. During a bronchospasm it is hard to breathe. This is because the airways get smaller. A bronchospasm can be triggered by:  Allergies. These may be to animals, pollen, food, or mold.  Infection. This is a common cause of bronchospasm.  Exercise.  Irritants. These include pollution, cigarette smoke, strong odors, aerosol sprays, and paint fumes.  Weather changes.  Stress.  Being emotional. HOME CARE   Always have a plan for getting help. Know when to call your doctor and local emergency services (911 in the [place] Know where you can get emergency care.  Only take medicines as told by your doctor.  If you were prescribed an inhaler or nebulizer machine, ask your doctor how to use it correctly. Always use a spacer with your inhaler if you were given one.  Stay calm during an attack. Try to relax and breathe more slowly.  Control your home environment:  Change your heating and air conditioning filter at least once a month.  Limit your use of fireplaces and wood stoves.  Do not  smoke. Do not  allow smoking in your home.  Avoid perfumes and fragrances.  Get rid of pests (such as roaches and mice) and their droppings.  Throw away plants if you see mold on them.  Keep your house clean and dust free.  Replace carpet with wood, tile, or vinyl flooring. Carpet can trap dander and dust.  Use allergy-proof pillows, mattress covers, and box spring covers.  Wash bed sheets and blankets every week in hot water. Dry them in a dryer.  Use blankets that are made of polyester or cotton.  Wash hands frequently. GET HELP IF:  You have muscle  aches.  You have chest pain.  The thick spit you spit or cough up (sputum) changes from clear or white to yellow, green, gray, or bloody.  The thick spit you spit or cough up gets thicker.  There are problems that may be related to the medicine you are given such as:  A rash.  Itching.  Swelling.  Trouble breathing. GET HELP RIGHT AWAY IF:  You feel you cannot breathe or catch your breath.  You cannot stop coughing.  Your treatment is not helping you breathe better.  You have very bad chest pain. MAKE SURE YOU:   Understand these instructions.  Will watch your condition.  Will get help right away if you are not doing well or get worse. Document Released: 04/07/2009 Document Revised: 06/15/2013 Document Reviewed: 12/01/2012 [facility] Patient Information 2015 [place], [place]. This information is not intended to replace advice given to you by your health care provider. Make sure you discuss any questions you have with your health care provider.  Sinusitis Sinusitis is redness, soreness, and swelling (inflammation) of the paranasal sinuses. Paranasal sinuses are air pockets within the bones of your face (beneath the eyes, the middle of the forehead, or above the eyes). In healthy paranasal sinuses, mucus is able to drain out, and air is able to circulate through them by way of your nose. However, when your paranasal sinuses are inflamed, mucus and air can become trapped. This can allow bacteria and other germs to  grow and cause infection. Sinusitis can develop quickly and last only a short time (acute) or continue over a long period (chronic). Sinusitis that lasts for more than 12 weeks is considered chronic.  CAUSES  Causes of sinusitis include:  Allergies.  Structural abnormalities, such as displacement of the cartilage that separates your nostrils (deviated septum), which can decrease the air flow through your nose and sinuses and affect sinus drainage.  Functional  abnormalities, such as when the small hairs (cilia) that line your sinuses and help remove mucus do not work properly or are not present. SYMPTOMS  Symptoms of acute and chronic sinusitis are the same. The primary symptoms are pain and pressure around the affected sinuses. Other symptoms include:  Upper toothache.  Earache.  Headache.  Bad breath.  Decreased sense of smell and taste.  A cough, which worsens when you are lying flat.  Fatigue.  Fever.  Thick drainage from your nose, which often is green and may contain pus (purulent).  Swelling and warmth over the affected sinuses. DIAGNOSIS  Your caregiver will perform a physical exam. During the exam, your caregiver may:  Look in your nose for signs of abnormal growths in your nostrils (nasal polyps).  Tap over the affected sinus to check for signs of infection.  View the inside of your sinuses (endoscopy) with a special imaging device with a light attached (endoscope), which is inserted into your sinuses. If your caregiver suspects that you have chronic sinusitis, one or more of the following tests may be recommended:  Allergy tests.  Nasal culture--A sample of mucus is taken from your nose and sent to a lab and screened for bacteria.  Nasal cytology--A sample of mucus is taken from your nose and examined by your caregiver to determine if your sinusitis is related to an allergy. TREATMENT  Most cases of acute sinusitis are related to a viral infection and will resolve on their own within 10 days. Sometimes medicines are prescribed to help relieve symptoms (pain medicine, decongestants, nasal steroid sprays, or saline sprays).  However, for sinusitis related to a bacterial infection, your caregiver will prescribe antibiotic medicines. These are medicines that will help kill the bacteria causing the infection.  Rarely, sinusitis is caused by a fungal infection. In theses cases, your caregiver will prescribe antifungal  medicine. For some cases of chronic sinusitis, surgery is needed. Generally, these are cases in which sinusitis recurs more than 3 times per year, despite other treatments. HOME CARE INSTRUCTIONS   Drink plenty of water. Water helps thin the mucus so your sinuses can drain more easily.  Use a humidifier.  Inhale steam 3 to 4 times a day (for example, sit in the bathroom with the shower running).  Apply a warm, moist washcloth to your face 3 to 4 times a day, or as directed by your caregiver.  Use saline nasal sprays to help moisten and clean your sinuses.  Take over-the-counter or prescription medicines for pain, discomfort, or fever only as directed by your caregiver. SEEK IMMEDIATE MEDICAL CARE IF:  You have increasing pain or severe headaches.  You have nausea, vomiting, or drowsiness.  You have swelling around your face.  You have vision problems.  You have a stiff neck.  You have difficulty breathing. MAKE SURE YOU:   Understand these instructions.  Will watch your condition.  Will get help right away if you are not doing well or get worse. Document Released: 06/10/2005 Document Revised: 09/02/2011 Document Reviewed: 06/25/2011 [employer] Patient Information  2015 [employer]. This information is not intended to replace advice given to you by your health care provider. Make sure you discuss any questions you have with your health care provider.  

## 2014-01-10 NOTE — Assessment & Plan Note (Addendum)
Pt states never started cefdini(She tells me she never got rx) So will rx today. Also her rt TM looks suspicious for early om. Take probiotic otc.

## 2014-01-10 NOTE — Progress Notes (Signed)
Pre visit review using our clinic review tool, if applicable. No additional management support is needed unless otherwise documented below in the visit note. 

## 2014-01-17 ENCOUNTER — Encounter: Payer: Self-pay | Admitting: Pulmonary Disease

## 2014-01-27 ENCOUNTER — Other Ambulatory Visit: Payer: Self-pay

## 2014-01-27 MED ORDER — ATORVASTATIN CALCIUM 20 MG PO TABS: 20.0000 mg | ORAL_TABLET | Freq: Every day | ORAL | Status: DC

## 2014-01-28 ENCOUNTER — Telehealth: Payer: Self-pay

## 2014-01-28 NOTE — Telephone Encounter (Signed)
We received paperwork from [facility] stating that patient recently started Cartia XT 120 mg from her cardiologist, Dr [name]  There saying there is a drug interaction between simvastatin and diltizem.   They would like to change simvastatin to atorvastatin 20 mg po daily. They stated Atorvatatin doesn't carry as much of a risk with diltiazem and is a zero copay for patient?  Please advise? Pt would like a 90 day supply

## 2014-01-29 NOTE — Telephone Encounter (Signed)
OK to switch meds I thought I already did this on paper but if not please d/c Simvastatin and start Atorvastatin 20 mg po qhs, disp #30 with 3 rf

## 2014-01-31 ENCOUNTER — Encounter: Payer: Self-pay | Admitting: Pulmonary Disease

## 2014-01-31 ENCOUNTER — Ambulatory Visit (INDEPENDENT_AMBULATORY_CARE_PROVIDER_SITE_OTHER): Payer: 59 | Admitting: Pulmonary Disease

## 2014-01-31 VITALS — BP 122/86 | HR 62 | Temp 98.3°F | Ht 66.0 in | Wt 321.6 lb

## 2014-01-31 DIAGNOSIS — G4733 Obstructive sleep apnea (adult) (pediatric): Secondary | ICD-10-CM

## 2014-01-31 MED ORDER — ATORVASTATIN CALCIUM 20 MG PO TABS: 20.0000 mg | ORAL_TABLET | Freq: Every day | ORAL | Status: DC

## 2014-01-31 NOTE — Patient Instructions (Signed)
Follow up in 1 year.

## 2014-01-31 NOTE — Assessment & Plan Note (Signed)
She is doing well CPAP.  She is compliant and reports benefit.  Will continue auto CPAP set up.

## 2014-01-31 NOTE — Progress Notes (Signed)
Chief Complaint  Patient presents with  . Follow-up    CPAP>> Pt denies problems with mask fit and pressure. Wears CPAP nightly x 7 hrs.     History of Present Illness: [name] is a 63 y.o. female with OSA.  She has done well with CPAP.  She is surprised how much better she is sleeping.  She uses nasal pillows.  She had issues with more allergies when she first started, but this is better now.  She can't sleep w/o using CPAP.  TESTS: HST 11/05/13 >> AHI 68.1, SaO2 low 62%. Auto CPAP 12/06/13 to 01/04/14 >> used on 30 of 30 nights with average 8 hrs and 53 min.  Average AHI is 1 with median CPAP 10 cm H2O and 95 th percentile CPAP 13 cm H20.  PMHx, PSHx, Medications, Allergies, Fhx, Shx reviewed.  Physical Exam:  General - No distress ENT - No sinus tenderness, no oral exudate, no LAN, MP 3, 2+ tonsils, elongated uvula Cardiac - s1s2 regular, no murmur Chest - No wheeze/rales/dullness Back - No focal tenderness Abd - Soft, non-tender Ext - No edema Neuro - Normal strength Skin - No rashes Psych - normal mood, and behavior   Assessment/Plan:  Chesley Mires, MD [place] [facility] Pager:  [phone]

## 2014-02-22 ENCOUNTER — Ambulatory Visit (INDEPENDENT_AMBULATORY_CARE_PROVIDER_SITE_OTHER): Payer: 59 | Admitting: Cardiology

## 2014-02-22 ENCOUNTER — Encounter: Payer: Self-pay | Admitting: Cardiology

## 2014-02-22 VITALS — BP 128/102 | HR 66 | Ht 66.0 in | Wt 322.8 lb

## 2014-02-22 DIAGNOSIS — I5032 Chronic diastolic (congestive) heart failure: Secondary | ICD-10-CM

## 2014-02-22 DIAGNOSIS — I10 Essential (primary) hypertension: Secondary | ICD-10-CM

## 2014-02-22 DIAGNOSIS — I471 Supraventricular tachycardia: Secondary | ICD-10-CM | POA: Insufficient documentation

## 2014-02-22 DIAGNOSIS — R0602 Shortness of breath: Secondary | ICD-10-CM

## 2014-02-22 DIAGNOSIS — I2510 Atherosclerotic heart disease of native coronary artery without angina pectoris: Secondary | ICD-10-CM

## 2014-02-22 NOTE — Patient Instructions (Addendum)
Your physician recommends that you continue on your current medications as directed. Please refer to the Current Medication list given to you today.  Your physician recommends that you return for a FASTING lipid profile and ALT in four weeks. Please schedule with Check out before leaving today.  Your physician wants you to follow-up in: 6 months with Dr Mallie Snooks will receive a reminder letter in the mail two months in advance. If you don't receive a letter, please call our office to schedule the follow-up appointment.

## 2014-02-22 NOTE — Progress Notes (Signed)
[name], [name]  [address] Phone: [phone] Fax:  [phone]  Date:  02/22/2014   ID:  [name], DOB 1951/04/18, MRN [id number]  PCP:  Penni Homans, MD  Cardiologist:  Fransico Him, MD     History of Present Illness: [name] is a 63 y.o. female with a history of HTN, chronic diastolic CHF and dyslipidemia who presents today for followup. She saw me recently with complaints of chest pressure and SOB when at work as well as palpitations. Both her dad and brother have a history of CAD. She has been under a lot of stress. Her Dad was killed and she had to moved in with her mom which has caused her a lot of stress. She underwent recent nuclear stress test which showed small defect in very distal anteroseptal and apical wall that improves at rest. Otherwise normal perfusion and felt to possibly represent some shifting soft tissue (breast) but could not exclude trivial ischemia. Heart monitor showed nonsustained atrial tachycardia. She continued to have SOB and underwent cardiac cath which revealed nonobstructive ASCAD with 20% mid LAD, 2o% prox left circ and 20% ostial RCA with elevated LVEDP. She was started on Lasix and presents back today for followup. She says that she has noticed a big difference after going on Lasix. She denies any chest pain, SOB, DOE, dizziness, palpitations or syncope.  Rarely she will have some LE edema when it gets hot out.    Wt Readings from Last 3 Encounters:  02/22/14 322 lb 12.8 oz (146.421 kg)  01/31/14 321 lb 9.6 oz (145.877 kg)  01/10/14 322 lb 1.3 oz (146.095 kg)     Past Medical History  Diagnosis Date  . GERD (gastroesophageal reflux disease)   . Anxiety   . Hyperlipidemia   . Hypertension   . Hx of colonic polyps   . Kidney stones   . Ventral hernia   . Chicken pox as a child  . Measles as a child  . Mumps as a child  . Obesity   . Vasomotor rhinitis 04/05/2012  . Sleep apnea 04/05/2012  . Anemia  04/05/2012  . Elevated LFTs 04/05/2012  . OA (osteoarthritis) of knee 04/05/2012  . Tinea corporis 02/23/2013  . Preventative health care 09/05/2013  . Chronic diastolic CHF (congestive heart failure), NYHA class 1     Current Outpatient Prescriptions  Medication Sig Dispense Refill  . albuterol (PROVENTIL HFA;VENTOLIN HFA) 108 (90 BASE) MCG/ACT inhaler Inhale 2 puffs into the lungs every 6 (six) hours as needed for wheezing or shortness of breath.  1 Inhaler  0  . aspirin 81 MG tablet Take 81 mg by mouth daily.        Marland Kitchen atorvastatin (LIPITOR) 20 MG tablet Take 1 tablet (20 mg total) by mouth daily.  90 tablet  1  . beclomethasone (QVAR) 80 MCG/ACT inhaler Inhale 1 puff into the lungs 2 (two) times daily.  1 Inhaler  0  . cetirizine (ZYRTEC) 10 MG tablet Take 1 tablet (10 mg total) by mouth daily.  90 tablet  3  . clotrimazole-betamethasone (LOTRISONE) cream Apply 1 application topically 2 (two) times daily as needed (for rash).      . diclofenac (VOLTAREN) 75 MG EC tablet Take 75 mg by mouth 2 (two) times daily.      Marland Kitchen diltiazem (CARDIZEM CD) 120 MG 24 hr capsule Take 1 capsule (120 mg total) by mouth daily.  90 capsule  3  .  escitalopram (LEXAPRO) 20 MG tablet Take 20 mg by mouth daily. TAKE 1 TABLET BY MOUTH DAILY.      . fluticasone (FLONASE) 50 MCG/ACT nasal spray Place 1 spray into both nostrils daily as needed for allergies.  16 g  1  . furosemide (LASIX) 40 MG tablet Take 1 tablet (40 mg total) by mouth daily.  90 tablet  3  . Glucosamine HCl (GLUCOSAMINE PO) Take 1 tablet by mouth daily.      Marland Kitchen losartan (COZAAR) 100 MG tablet Take 1 tablet (100 mg total) by mouth daily.  90 tablet  3  . nebivolol (BYSTOLIC) 10 MG tablet Take 1 tablet (10 mg total) by mouth daily.  90 tablet  3  . omega-3 acid ethyl esters (LOVAZA) 1 G capsule Take 1 g by mouth daily.      . pantoprazole (PROTONIX) 40 MG tablet Take 1 tablet (40 mg total) by mouth 2 (two) times daily.  180 tablet  3  . Polyethyl  Glycol-Propyl Glycol (SYSTANE OP) Apply 1 drop to eye 2 (two) times daily as needed (for dry eyes).       No current facility-administered medications for this visit.    Allergies:   No Known Allergies  Social History:  The patient  reports that she has never smoked. She has never used smokeless tobacco. She reports that she does not drink alcohol or use illicit drugs.   Family History:  The patient's family history includes Alcohol abuse in her paternal grandfather; Arthritis in her father; Cancer in her maternal aunt and maternal grandmother; Coronary artery disease in her brother and father; Heart attack in her maternal grandfather; Heart attack (age of onset: 56) in her father; Heart disease in her brother; Hyperlipidemia in her mother; Hypertension in her father; Thyroid disease in her mother.   ROS:  Please see the history of present illness.      All other systems reviewed and negative.   PHYSICAL EXAM: VS:  BP 128/102  Pulse 66  Ht 5\' 6"  (1.676 m)  Wt 322 lb 12.8 oz (146.421 kg)  BMI 52.13 kg/m2 Well nourished, well developed, in no acute distress HEENT: normal Neck: no JVD Cardiac:  normal S1, S2; RRR; no murmur Lungs:  clear to auscultation bilaterally, no wheezing, rhonchi or rales Abd: soft, nontender, no hepatomegaly Ext: no edema Skin: warm and dry Neuro:  CNs 2-12 intact, no focal abnormalities noted  ASSESSMENT AND PLAN:       1.  Chronic diastolic CHF - symptoms much improved after going on Lasix  - continue Lasix/BB/CCB 2. HTN with borderline control today.  She has not take her Cardizem this am.  She checks her BP at home and it runs around 130/60's.  I have asked her to check her BP daily at home for a week and call with the results. - continue Bystolic/Cardizem/Cozaar 3. SOB secondary to #1 - improved on Lasix        4. Nonsustained atrial tachycardia        5  Nonobstructive ASCAD - continue ASA/statin - recheck lipids after changing to Lipitor  Followup  with me in 6 months       Signed, Fransico Him, MD 02/22/2014 8:25 AM

## 2014-03-01 ENCOUNTER — Other Ambulatory Visit: Payer: Self-pay | Admitting: Family Medicine

## 2014-03-24 ENCOUNTER — Other Ambulatory Visit (INDEPENDENT_AMBULATORY_CARE_PROVIDER_SITE_OTHER): Payer: 59 | Admitting: *Deleted

## 2014-03-24 DIAGNOSIS — I2510 Atherosclerotic heart disease of native coronary artery without angina pectoris: Secondary | ICD-10-CM

## 2014-03-24 LAB — LDL CHOLESTEROL, DIRECT: Direct LDL: 91.5 mg/dL

## 2014-03-24 LAB — LIPID PANEL
CHOL/HDL RATIO: 5
CHOLESTEROL: 164 mg/dL (ref 0–200)
HDL: 31.8 mg/dL — ABNORMAL LOW (ref >39.00)
NonHDL: 132.2
TRIGLYCERIDES: 240 mg/dL — AB (ref 0.0–149.0)
VLDL: 48 mg/dL — ABNORMAL HIGH (ref 0.0–40.0)

## 2014-03-24 LAB — ALT: ALT: 29 U/L (ref 0–35)

## 2014-04-01 ENCOUNTER — Other Ambulatory Visit: Payer: Self-pay | Admitting: Cardiology

## 2014-04-01 DIAGNOSIS — E785 Hyperlipidemia, unspecified: Secondary | ICD-10-CM

## 2014-04-04 ENCOUNTER — Ambulatory Visit (INDEPENDENT_AMBULATORY_CARE_PROVIDER_SITE_OTHER): Payer: 59 | Admitting: Family Medicine

## 2014-04-04 ENCOUNTER — Encounter: Payer: Self-pay | Admitting: Family Medicine

## 2014-04-04 VITALS — BP 134/82 | HR 59 | Temp 98.1°F | Ht 66.0 in | Wt 317.2 lb

## 2014-04-04 DIAGNOSIS — G4733 Obstructive sleep apnea (adult) (pediatric): Secondary | ICD-10-CM

## 2014-04-04 DIAGNOSIS — I2510 Atherosclerotic heart disease of native coronary artery without angina pectoris: Secondary | ICD-10-CM

## 2014-04-04 DIAGNOSIS — M1711 Unilateral primary osteoarthritis, right knee: Secondary | ICD-10-CM

## 2014-04-04 DIAGNOSIS — I10 Essential (primary) hypertension: Secondary | ICD-10-CM

## 2014-04-04 DIAGNOSIS — M179 Osteoarthritis of knee, unspecified: Secondary | ICD-10-CM

## 2014-04-04 DIAGNOSIS — E785 Hyperlipidemia, unspecified: Secondary | ICD-10-CM

## 2014-04-04 DIAGNOSIS — R739 Hyperglycemia, unspecified: Secondary | ICD-10-CM

## 2014-04-04 MED ORDER — ROSUVASTATIN CALCIUM 20 MG PO TABS: 20.0000 mg | ORAL_TABLET | Freq: Every day | ORAL | Status: DC

## 2014-04-04 NOTE — Patient Instructions (Signed)

## 2014-04-04 NOTE — Progress Notes (Signed)
Pre visit review using our clinic review tool, if applicable. No additional management support is needed unless otherwise documented below in the visit note. 

## 2014-04-04 NOTE — Assessment & Plan Note (Addendum)
hgba1c acceptable, minimize simple carbs. Increase exercise as tolerated. Took flu shot 2 weeks ago

## 2014-04-04 NOTE — Assessment & Plan Note (Signed)
Improved on recheck. Well controlled, no changes to meds. Encouraged heart healthy diet such as the DASH diet and exercise as tolerated.  

## 2014-04-04 NOTE — Assessment & Plan Note (Signed)
Tolerating statin, encouraged heart healthy diet, avoid trans fats, minimize simple carbs and saturated fats. Increase exercise as tolerated 

## 2014-04-10 ENCOUNTER — Encounter: Payer: Self-pay | Admitting: Family Medicine

## 2014-04-10 NOTE — Assessment & Plan Note (Signed)
Is scheduled for right TKR on 05/10/2014 with Dr Alvan Dame. She is anxious to proceed and otherwise feels well at this time. Is provided pre op clearance at this time pending cardiology approval

## 2014-04-10 NOTE — Assessment & Plan Note (Addendum)
Is following with pulmonology Uses CPAP nightly

## 2014-04-10 NOTE — Progress Notes (Signed)
[name], female   DOB: 20-Nov-1950, 63 y.o.   MRN: [id number] [name] [id number] March 29, 1951 04/10/2014      Progress Note-Follow Up  Subjective  Chief Complaint  Chief Complaint  Patient presents with  . Follow-up    3 month    HPI  Patient is a 63 year old female in today for routine medical care. She is in today for routine follow up is requesting preop clearance. She is anxious to proceed with right total knee on 05/10/2004 with Dr Alvan Dame. Her pain is very limiting and she is anxious to become more active again. She reports she uses her CPAP nightly. Rested. No recent illness. Denies CP/palp/SOB/HA/congestion/fevers/GI or GU c/o. Taking meds as prescribed  Past Medical History  Diagnosis Date  . GERD (gastroesophageal reflux disease)   . Anxiety   . Hyperlipidemia   . Hypertension   . Hx of colonic polyps   . Kidney stones   . Ventral hernia   . Chicken pox as a child  . Measles as a child  . Mumps as a child  . Obesity   . Vasomotor rhinitis 04/05/2012  . Sleep apnea 04/05/2012  . Anemia 04/05/2012  . Elevated LFTs 04/05/2012  . OA (osteoarthritis) of knee 04/05/2012  . Tinea corporis 02/23/2013  . Preventative health care 09/05/2013  . Chronic diastolic CHF (congestive heart failure), NYHA class 1     Past Surgical History  Procedure Laterality Date  . Cholecystectomy    . Meniscus repair  2009  . Total knee arthroplasty  2011    left  . Knee arthroscopy  dec 2011    right  . Mouth surgery      teeth implants  . Hernia repair  02/08/11    ventral hernia  . Cesarean section      X 3  . Wisdom tooth extraction  2000  . Cardiac catheterization      normal coroary arteries per patient    Family History  Problem Relation Age of Onset  . Thyroid disease Mother   . Hyperlipidemia Mother   . Heart attack Father 46  . Hypertension Father   . Arthritis Father     RA  . Coronary artery disease Father   . Coronary artery disease  Brother   . Heart disease Brother   . Cancer Maternal Aunt     colon  . Cancer Maternal Grandmother     colon  . Heart attack Maternal Grandfather   . Alcohol abuse Paternal Grandfather     History   Social History  . Marital Status: Divorced    Spouse Name: N/A    Number of Children: N/A  . Years of Education: N/A   Occupational History  . Not on file.   Social History Main Topics  . Smoking status: Never Smoker   . Smokeless tobacco: Never Used  . Alcohol Use: No  . Drug Use: No  . Sexual Activity: No     Comment: lives with mother currently-stress   Other Topics Concern  . Not on file   Social History Narrative  . No narrative on file    Current Outpatient Prescriptions on File Prior to Visit  Medication Sig Dispense Refill  . albuterol (PROVENTIL HFA;VENTOLIN HFA) 108 (90 BASE) MCG/ACT inhaler Inhale 2 puffs into the lungs every 6 (six) hours as needed for wheezing or shortness of breath.  1 Inhaler  0  . aspirin 81 MG tablet Take 81  mg by mouth daily.        . beclomethasone (QVAR) 80 MCG/ACT inhaler Inhale 1 puff into the lungs 2 (two) times daily.  1 Inhaler  0  . BYSTOLIC 10 MG tablet TAKE 1 TABLET BY MOUTH DAILY.  90 tablet  PRN  . cetirizine (ZYRTEC) 10 MG tablet Take 1 tablet (10 mg total) by mouth daily.  90 tablet  3  . clotrimazole-betamethasone (LOTRISONE) cream Apply 1 application topically 2 (two) times daily as needed (for rash).      . diclofenac (VOLTAREN) 75 MG EC tablet Take 75 mg by mouth 2 (two) times daily.      Marland Kitchen diltiazem (CARDIZEM CD) 120 MG 24 hr capsule Take 1 capsule (120 mg total) by mouth daily.  90 capsule  3  . escitalopram (LEXAPRO) 20 MG tablet Take 20 mg by mouth daily. TAKE 1 TABLET BY MOUTH DAILY.      . fluticasone (FLONASE) 50 MCG/ACT nasal spray Place 1 spray into both nostrils daily as needed for allergies.  16 g  1  . furosemide (LASIX) 40 MG tablet Take 1 tablet (40 mg total) by mouth daily.  90 tablet  3  . Glucosamine  HCl (GLUCOSAMINE PO) Take 1 tablet by mouth daily.      Marland Kitchen losartan (COZAAR) 100 MG tablet Take 1 tablet (100 mg total) by mouth daily.  90 tablet  3  . omega-3 acid ethyl esters (LOVAZA) 1 G capsule Take 1 g by mouth daily.      . pantoprazole (PROTONIX) 40 MG tablet Take 1 tablet (40 mg total) by mouth 2 (two) times daily.  180 tablet  3  . Polyethyl Glycol-Propyl Glycol (SYSTANE OP) Apply 1 drop to eye 2 (two) times daily as needed (for dry eyes).       No current facility-administered medications on file prior to visit.    No Known Allergies  Review of Systems  Review of Systems  Constitutional: Negative for fever and malaise/fatigue.  HENT: Negative for congestion.   Eyes: Negative for discharge.  Respiratory: Negative for shortness of breath.   Cardiovascular: Negative for chest pain, palpitations and leg swelling.  Gastrointestinal: Negative for nausea, abdominal pain and diarrhea.  Genitourinary: Negative for dysuria.  Musculoskeletal: Positive for joint pain. Negative for falls.  Skin: Negative for rash.  Neurological: Negative for loss of consciousness and headaches.  Endo/Heme/Allergies: Negative for polydipsia.  Psychiatric/Behavioral: Negative for depression and suicidal ideas. The patient is not nervous/anxious and does not have insomnia.     Objective  BP 134/82  Pulse 59  Temp(Src) 98.1 F (36.7 C) (Oral)  Ht 5\' 6"  (1.676 m)  Wt 317 lb 3.2 oz (143.881 kg)  BMI 51.22 kg/m2  SpO2 96%  Physical Exam  Physical Exam  Constitutional: She is oriented to person, place, and time and well-developed, well-nourished, and in no distress. No distress.  HENT:  Head: Normocephalic and atraumatic.  Eyes: Conjunctivae are normal.  Neck: Neck supple. No thyromegaly present.  Cardiovascular: Normal rate, regular rhythm and normal heart sounds.   No murmur heard. Pulmonary/Chest: Effort normal and breath sounds normal. She has no wheezes.  Abdominal: She exhibits no  distension and no mass.  Musculoskeletal: She exhibits no edema.  Lymphadenopathy:    She has no cervical adenopathy.  Neurological: She is alert and oriented to person, place, and time.  Skin: Skin is warm and dry. No rash noted. She is not diaphoretic.  Psychiatric: Memory, affect and judgment normal.  Lab Results  Component Value Date   TSH 0.557 12/31/2013   Lab Results  Component Value Date   WBC 7.0 12/31/2013   HGB 13.3 12/31/2013   HCT 40.4 12/31/2013   MCV 86.9 12/31/2013   PLT 278 12/31/2013   Lab Results  Component Value Date   CREATININE 0.79 12/31/2013   BUN 12 12/31/2013   NA 140 12/31/2013   K 4.8 12/31/2013   CL 101 12/31/2013   CO2 29 12/31/2013   Lab Results  Component Value Date   ALT 29 03/24/2014   AST 25 12/31/2013   ALKPHOS 80 12/31/2013   BILITOT 0.5 12/31/2013   Lab Results  Component Value Date   CHOL 164 03/24/2014   Lab Results  Component Value Date   HDL 31.80* 03/24/2014   Lab Results  Component Value Date   LDLCALC 79 12/31/2013   Lab Results  Component Value Date   TRIG 240.0* 03/24/2014   Lab Results  Component Value Date   CHOLHDL 5 03/24/2014     Assessment & Plan  Essential hypertension Improved on recheck. Well controlled, no changes to meds. Encouraged heart healthy diet such as the DASH diet and exercise as tolerated.   Hyperglycemia hgba1c acceptable, minimize simple carbs. Increase exercise as tolerated. Took flu shot 2 weeks ago  Hyperlipidemia Tolerating statin, encouraged heart healthy diet, avoid trans fats, minimize simple carbs and saturated fats. Increase exercise as tolerated  OA (osteoarthritis) of knee Is scheduled for right TKR on 05/10/2014 with Dr Alvan Dame. She is anxious to proceed and otherwise feels well at this time. Is provided pre op clearance at this time pending cardiology approval  Obstructive sleep apnea Is following with pulmonology Uses CPAP nightly

## 2014-04-21 ENCOUNTER — Telehealth: Payer: Self-pay | Admitting: Cardiology

## 2014-04-21 NOTE — Telephone Encounter (Signed)
New message    Patient calling in reference to fax that was sent over - knee replacement.    [phone] attention Sherri .Dr.Olin.

## 2014-04-21 NOTE — Telephone Encounter (Signed)
Patient asking if surgical clearance fax was received. Informed patient that fax was received, but Dr. [name] will not be in the office until tomorrow afternoon to review the paperwork. Patient agreeable.

## 2014-04-22 NOTE — H&P (Signed)
TOTAL KNEE ADMISSION H&P  Patient is being admitted for right total knee arthroplasty.  Subjective:  Chief Complaint:    Right knee primary OA / pain.  HPI: CINDIA HUSTEAD, 63 y.o. female, has a history of pain and functional disability in the right knee due to arthritis and has failed non-surgical conservative treatments for greater than 12 weeks to includeNSAID's and/or analgesics, corticosteriod injections, viscosupplementation injections, use of assistive devices and activity modification.  Onset of symptoms was gradual, starting 5+ years ago with gradually worsening course since that time. The patient noted prior procedures on the knee to include  arthroscopy and menisectomy on the right knee(s).  Patient currently rates pain in the right knee(s) at 10 out of 10 with activity. Patient has night pain, worsening of pain with activity and weight bearing, pain that interferes with activities of daily living, pain with passive range of motion, crepitus and joint swelling.  Patient has evidence of periarticular osteophytes and joint space narrowing by imaging studies. There is no active infection.  Risks, benefits and expectations were discussed with the patient.  Risks including but not limited to the risk of anesthesia, blood clots, nerve damage, blood vessel damage, failure of the prosthesis, infection and up to and including death.  Patient understand the risks, benefits and expectations and wishes to proceed with surgery.  PCP: Penni Homans, MD  D/C Plans:      Home with HHPT (Daughter's house)  Post-op Meds:       No Rx given   Tranexamic Acid:      To be given - IV    Decadron:      Is to be given  FYI:     ASA post-op   Norco post-op  CPAP    Patient Active Problem List   Diagnosis Date Noted  . Atrial tachycardia, paroxysmal 02/22/2014  . Hyperglycemia 12/31/2013  . Acute sinusitis with symptoms > 10 days 12/20/2013  . Wheezing 12/20/2013  . Chronic diastolic CHF  (congestive heart failure), NYHA class 1 11/22/2013  . CAD (coronary artery disease), native coronary artery 11/22/2013  . SOB (shortness of breath) 11/03/2013  . Undiagnosed cardiac murmurs 09/05/2013  . Preventative health care 09/05/2013  . Tinea corporis 02/23/2013  . Musculoskeletal pain 11/10/2012  . Vasomotor rhinitis 04/05/2012  . Obstructive sleep apnea 04/05/2012  . Anemia 04/05/2012  . Elevated LFTs 04/05/2012  . OA (osteoarthritis) of knee 04/05/2012  . Hernia 10/13/2010  . Hyperlipidemia 09/11/2007  . ANXIETY 09/11/2007  . Essential hypertension 09/11/2007  . GERD 09/11/2007  . RENAL CALCULUS 09/11/2007  . RENAL CYST 09/11/2007  . COLONIC POLYPS, HX OF 09/11/2007   Past Medical History  Diagnosis Date  . GERD (gastroesophageal reflux disease)   . Anxiety   . Hyperlipidemia   . Hypertension   . Hx of colonic polyps   . Kidney stones   . Ventral hernia   . Chicken pox as a child  . Measles as a child  . Mumps as a child  . Obesity   . Vasomotor rhinitis 04/05/2012  . Sleep apnea 04/05/2012  . Anemia 04/05/2012  . Elevated LFTs 04/05/2012  . OA (osteoarthritis) of knee 04/05/2012  . Tinea corporis 02/23/2013  . Preventative health care 09/05/2013  . Chronic diastolic CHF (congestive heart failure), NYHA class 1     Past Surgical History  Procedure Laterality Date  . Cholecystectomy    . Meniscus repair  2009  . Total knee arthroplasty  2011  left  . Knee arthroscopy  dec 2011    right  . Mouth surgery      teeth implants  . Hernia repair  02/08/11    ventral hernia  . Cesarean section      X 3  . Wisdom tooth extraction  2000  . Cardiac catheterization      normal coroary arteries per patient    No prescriptions prior to admission   No Known Allergies  History  Substance Use Topics  . Smoking status: Never Smoker   . Smokeless tobacco: Never Used  . Alcohol Use: No    Family History  Problem Relation Age of Onset  . Thyroid disease  Mother   . Hyperlipidemia Mother   . Heart attack Father 92  . Hypertension Father   . Arthritis Father     RA  . Coronary artery disease Father   . Coronary artery disease Brother   . Heart disease Brother   . Cancer Maternal Aunt     colon  . Cancer Maternal Grandmother     colon  . Heart attack Maternal Grandfather   . Alcohol abuse Paternal Grandfather      Review of Systems  Constitutional: Positive for malaise/fatigue.  HENT: Negative.   Eyes: Negative.   Respiratory: Positive for shortness of breath.   Cardiovascular: Negative.   Gastrointestinal: Positive for heartburn.  Genitourinary: Negative.   Musculoskeletal: Positive for back pain, joint pain and myalgias.  Skin: Positive for rash.  Neurological: Negative.   Endo/Heme/Allergies: Positive for environmental allergies.  Psychiatric/Behavioral: The patient is nervous/anxious.     Objective:  Physical Exam  Constitutional: She is oriented to person, place, and time. She appears well-developed and well-nourished.  HENT:  Head: Normocephalic and atraumatic.  Eyes: Pupils are equal, round, and reactive to light.  Neck: Neck supple. No JVD present. No tracheal deviation present. No thyromegaly present.  Cardiovascular: Normal rate, regular rhythm and intact distal pulses.   Respiratory: Effort normal and breath sounds normal.  GI: Soft. There is no tenderness. There is no guarding.  Musculoskeletal:       Right knee: She exhibits decreased range of motion, swelling and bony tenderness. She exhibits no ecchymosis, no deformity, no laceration and no erythema. Tenderness found.  Lymphadenopathy:    She has no cervical adenopathy.  Neurological: She is alert and oriented to person, place, and time.  Skin: Skin is warm and dry.  Psychiatric: She has a normal mood and affect.     Labs:  Estimated body mass index is 52.13 kg/(m^2) as calculated from the following:   Height as of 02/22/14: 5\' 6"  (1.676 m).   Weight  as of 02/22/14: 146.421 kg (322 lb 12.8 oz).   Imaging Review Plain radiographs demonstrate severe degenerative joint disease of the right knee(s). The overall alignment is neutral. The bone quality appears to be good for age and reported activity level.  Assessment/Plan:  End stage arthritis, right knee   The patient history, physical examination, clinical judgment of the provider and imaging studies are consistent with end stage degenerative joint disease of the right knee(s) and total knee arthroplasty is deemed medically necessary. The treatment options including medical management, injection therapy arthroscopy and arthroplasty were discussed at length. The risks and benefits of total knee arthroplasty were presented and reviewed. The risks due to aseptic loosening, infection, stiffness, patella tracking problems, thromboembolic complications and other imponderables were discussed. The patient acknowledged the explanation, agreed to proceed with the plan  and consent was signed. Patient is being admitted for inpatient treatment for surgery, pain control, PT, OT, prophylactic antibiotics, VTE prophylaxis, progressive ambulation and ADL's and discharge planning. The patient is planning to be discharged home with home health services.       West Pugh Judyann Casasola   PA-C  04/22/2014, 12:47 PM

## 2014-04-25 ENCOUNTER — Other Ambulatory Visit: Payer: Self-pay | Admitting: Family Medicine

## 2014-05-03 ENCOUNTER — Encounter (HOSPITAL_COMMUNITY): Payer: Self-pay

## 2014-05-03 ENCOUNTER — Encounter (HOSPITAL_COMMUNITY)
Admission: RE | Admit: 2014-05-03 | Discharge: 2014-05-03 | Disposition: A | Discharge status: Home / Self Care | Payer: 59 | Source: Ambulatory Visit | Attending: Orthopedic Surgery | Admitting: Orthopedic Surgery

## 2014-05-03 DIAGNOSIS — Z01812 Encounter for preprocedural laboratory examination: Secondary | ICD-10-CM | POA: Insufficient documentation

## 2014-05-03 HISTORY — DX: Other complications of anesthesia, initial encounter: T88.59XA

## 2014-05-03 HISTORY — DX: Nausea with vomiting, unspecified: R11.2

## 2014-05-03 HISTORY — DX: Reserved for inherently not codable concepts without codable children: IMO0001

## 2014-05-03 HISTORY — DX: Cardiac murmur, unspecified: R01.1

## 2014-05-03 HISTORY — DX: Other specified postprocedural states: Z98.890

## 2014-05-03 HISTORY — DX: Adverse effect of unspecified anesthetic, initial encounter: T41.45XA

## 2014-05-03 LAB — PROTIME-INR
INR: 1.01 (ref 0.00–1.49)
Prothrombin Time: 13.4 seconds (ref 11.6–15.2)

## 2014-05-03 LAB — BASIC METABOLIC PANEL
ANION GAP: 14 (ref 5–15)
BUN: 21 mg/dL (ref 6–23)
CALCIUM: 10 mg/dL (ref 8.4–10.5)
CO2: 28 meq/L (ref 19–32)
Chloride: 101 mEq/L (ref 96–112)
Creatinine, Ser: 0.84 mg/dL (ref 0.50–1.10)
GFR calc non Af Amer: 72 mL/min — ABNORMAL LOW (ref >90)
GFR, EST AFRICAN AMERICAN: 84 mL/min — AB (ref >90)
Glucose, Bld: 120 mg/dL — ABNORMAL HIGH (ref 70–99)
Potassium: 4.6 mEq/L (ref 3.7–5.3)
Sodium: 143 mEq/L (ref 137–147)

## 2014-05-03 LAB — URINALYSIS, ROUTINE W REFLEX MICROSCOPIC
Bilirubin Urine: NEGATIVE
Glucose, UA: NEGATIVE mg/dL
Hgb urine dipstick: NEGATIVE
KETONES UR: NEGATIVE mg/dL
LEUKOCYTES UA: NEGATIVE
Nitrite: NEGATIVE
PH: 7 (ref 5.0–8.0)
Protein, ur: NEGATIVE mg/dL
Specific Gravity, Urine: 1.009 (ref 1.005–1.030)
Urobilinogen, UA: 0.2 mg/dL (ref 0.0–1.0)

## 2014-05-03 LAB — APTT: APTT: 31 s (ref 24–37)

## 2014-05-03 LAB — SURGICAL PCR SCREEN
MRSA, PCR: NEGATIVE
Staphylococcus aureus: NEGATIVE

## 2014-05-03 LAB — CBC
HEMATOCRIT: 42.6 % (ref 36.0–46.0)
Hemoglobin: 13.7 g/dL (ref 12.0–15.0)
MCH: 29.1 pg (ref 26.0–34.0)
MCHC: 32.2 g/dL (ref 30.0–36.0)
MCV: 90.6 fL (ref 78.0–100.0)
Platelets: 285 10*3/uL (ref 150–400)
RBC: 4.7 MIL/uL (ref 3.87–5.11)
RDW: 14.4 % (ref 11.5–15.5)
WBC: 7.8 10*3/uL (ref 4.0–10.5)

## 2014-05-03 NOTE — Patient Instructions (Addendum)
[name]  05/03/2014   Your procedure is scheduled on:    Tuesday-- November 17,2015  Report to [facility] Main Entrance and follow signs to  [place] arrive at 10:00 AM.  Call this number if you have problems the morning of surgery [phone] or Presurgical Testing [phone].   Remember:  Do not eat food After Midnight but may have clear liquids up to 7:00am morning of surgery then nothing by mouth.  For Living Will and/or Health Care Power Attorney Forms: please provide copy for your medical record, may bring AM of surgery (forms should be already notarized-we do not provide this service).   For Cpap use: bring mask and tubing only.     Take these medicines the morning of surgery with A SIP OF WATER: Albuterol Inhaler,Qvar Inhaler (bring inhalers with you day of surgery);Certrizine(Zyrtec);Cartizem;Lexapro;Flonase (bring with you day of surgery);Protonix.                               You may not have any metal on your body including hair pins and piercings  Do not wear jewelry, make-up, lotions, powders, or deodorant.  Do not shave body hair  48 hours(2 days) of CHG soap use.                Do not bring valuables to the hospital. [place].  Contacts, dentures or bridgework may not be worn into surgery.  Leave suitcase in the car. After surgery it may be brought to your room.  For patients admitted to the hospital, checkout time is 11:00 AM the day of discharge.     Special Instructions: review fact sheets for MRSA information, Blood Transfusion fact sheet, Incentive Spirometry.  Remember: Type/Screen "Blue armsbands"- may not be removed once applied(would result in being retested AM of surgery, if removed). ________________________________________________________________________  [facility] - Preparing for Surgery Before surgery, you can play an important role.  Because skin is not sterile, your skin needs  to be as free of germs as possible.  You can reduce the number of germs on your skin by washing with CHG (chlorahexidine gluconate) soap before surgery.  CHG is an antiseptic cleaner which kills germs and bonds with the skin to continue killing germs even after washing. Please DO NOT use if you have an allergy to CHG or antibacterial soaps.  If your skin becomes reddened/irritated stop using the CHG and inform your nurse when you arrive at Short Stay. Do not shave (including legs and underarms) for at least 48 hours prior to the first CHG shower.  You may shave your face/neck. Please follow these instructions carefully:  1.  Shower with CHG Soap the night before surgery and the  morning of Surgery.  2.  If you choose to wash your hair, wash your hair first as usual with your  normal  shampoo.  3.  After you shampoo, rinse your hair and body thoroughly to remove the  shampoo.                           4.  Use CHG as you would any other liquid soap.  You can apply chg directly  to the skin and wash                       Gently with a  scrungie or clean washcloth.  5.  Apply the CHG Soap to your body ONLY FROM THE NECK DOWN.   Do not use on face/ open                           Wound or open sores. Avoid contact with eyes, ears mouth and genitals (private parts).                       Wash face,  Genitals (private parts) with your normal soap.             6.  Wash thoroughly, paying special attention to the area where your surgery  will be performed.  7.  Thoroughly rinse your body with warm water from the neck down.  8.  DO NOT shower/wash with your normal soap after using and rinsing off  the CHG Soap.                9.  Pat yourself dry with a clean towel.            10.  Wear clean pajamas.            11.  Place clean sheets on your bed the night of your first shower and do not  sleep with pets. Day of Surgery : Do not apply any lotions/deodorants the morning of surgery.  Please wear clean clothes  to the hospital/surgery center.  FAILURE TO FOLLOW THESE INSTRUCTIONS MAY RESULT IN THE CANCELLATION OF YOUR SURGERY PATIENT SIGNATURE_________________________________  NURSE SIGNATURE__________________________________  ________________________________________________________________________    CLEAR LIQUID DIET   Foods Allowed                                                                     Foods Excluded  Coffee and tea, regular and decaf                             liquids that you cannot  Plain Jell-O in any flavor                                             see through such as: Fruit ices (not with fruit pulp)                                     milk, soups, orange juice  Iced Popsicles                                    All solid food Carbonated beverages, regular and diet                                    Cranberry, grape and apple juices Sports drinks like Gatorade Lightly seasoned clear broth or consume(fat free) Sugar, honey syrup  Sample Menu Breakfast  Lunch                                     Supper Cranberry juice                    Beef broth                            Chicken broth Jell-O                                     Grape juice                           Apple juice Coffee or tea                        Jell-O                                      Popsicle                                                Coffee or tea                        Coffee or tea  _____________________________________________________________________    Incentive Spirometer  An incentive spirometer is a tool that can help keep your lungs clear and active. This tool measures how well you are filling your lungs with each breath. Taking long deep breaths may help reverse or decrease the chance of developing breathing (pulmonary) problems (especially infection) following:  A long period of time when you are unable to move or be active. BEFORE THE  PROCEDURE   If the spirometer includes an indicator to show your best effort, your nurse or respiratory therapist will set it to a desired goal.  If possible, sit up straight or lean slightly forward. Try not to slouch.  Hold the incentive spirometer in an upright position. INSTRUCTIONS FOR USE   Sit on the edge of your bed if possible, or sit up as far as you can in bed or on a chair.  Hold the incentive spirometer in an upright position.  Breathe out normally.  Place the mouthpiece in your mouth and seal your lips tightly around it.  Breathe in slowly and as deeply as possible, raising the piston or the ball toward the top of the column.  Hold your breath for 3-5 seconds or for as long as possible. Allow the piston or ball to fall to the bottom of the column.  Remove the mouthpiece from your mouth and breathe out normally.  Rest for a few seconds and repeat Steps 1 through 7 at least 10 times every 1-2 hours when you are awake. Take your time and take a few normal breaths between deep breaths.  The spirometer may include an indicator to show your best effort. Use the indicator as a goal to work toward during each repetition.  After each set of 10 deep breaths,  practice coughing to be sure your lungs are clear. If you have an incision (the cut made at the time of surgery), support your incision when coughing by placing a pillow or rolled up towels firmly against it. Once you are able to get out of bed, walk around indoors and cough well. You may stop using the incentive spirometer when instructed by your caregiver.  RISKS AND COMPLICATIONS  Take your time so you do not get dizzy or light-headed.  If you are in pain, you may need to take or ask for pain medication before doing incentive spirometry. It is harder to take a deep breath if you are having pain. AFTER USE  Rest and breathe slowly and easily.  It can be helpful to keep track of a log of your progress. Your caregiver  can provide you with a simple table to help with this. If you are using the spirometer at home, follow these instructions: [place] IF:   You are having difficultly using the spirometer.  You have trouble using the spirometer as often as instructed.  Your pain medication is not giving enough relief while using the spirometer.  You develop fever of 100.5 F (38.1 C) or higher. SEEK IMMEDIATE MEDICAL CARE IF:   You cough up bloody sputum that had not been present before.  You develop fever of 102 F (38.9 C) or greater.  You develop worsening pain at or near the incision site. MAKE SURE YOU:   Understand these instructions.  Will watch your condition.  Will get help right away if you are not doing well or get worse. Document Released: 10/21/2006 Document Revised: 09/02/2011 Document Reviewed: 12/22/2006 Exit[employer] Patient Information 2014 [employer], [place].   ________________________________________________________________________  WHAT IS A BLOOD TRANSFUSION? Blood Transfusion Information  A transfusion is the replacement of blood or some of its parts. Blood is made up of multiple cells which provide different functions.  Red blood cells carry oxygen and are used for blood loss replacement.  White blood cells fight against infection.  Platelets control bleeding.  Plasma helps clot blood.  Other blood products are available for specialized needs, such as hemophilia or other clotting disorders. BEFORE THE TRANSFUSION  Who gives blood for transfusions?   Healthy volunteers who are fully evaluated to make sure their blood is safe. This is blood bank blood. Transfusion therapy is the safest it has ever been in the practice of medicine. Before blood is taken from a donor, a complete history is taken to make sure that person has no history of diseases nor engages in risky social behavior (examples are intravenous drug use or sexual activity with multiple partners). The  donor's travel history is screened to minimize risk of transmitting infections, such as malaria. The donated blood is tested for signs of infectious diseases, such as HIV and hepatitis. The blood is then tested to be sure it is compatible with you in order to minimize the chance of a transfusion reaction. If you or a relative donates blood, this is often done in anticipation of surgery and is not appropriate for emergency situations. It takes many days to process the donated blood. RISKS AND COMPLICATIONS Although transfusion therapy is very safe and saves many lives, the main dangers of transfusion include:   Getting an infectious disease.  Developing a transfusion reaction. This is an allergic reaction to something in the blood you were given. Every precaution is taken to prevent this. The decision to have a blood transfusion has been  considered carefully by your caregiver before blood is given. Blood is not given unless the benefits outweigh the risks. AFTER THE TRANSFUSION  Right after receiving a blood transfusion, you will usually feel much better and more energetic. This is especially true if your red blood cells have gotten low (anemic). The transfusion raises the level of the red blood cells which carry oxygen, and this usually causes an energy increase.  The nurse administering the transfusion will monitor you carefully for complications. HOME CARE INSTRUCTIONS  No special instructions are needed after a transfusion. You may find your energy is better. Speak with your caregiver about any limitations on activity for underlying diseases you may have. SEEK MEDICAL CARE IF:   Your condition is not improving after your transfusion.  You develop redness or irritation at the intravenous (IV) site. SEEK IMMEDIATE MEDICAL CARE IF:  Any of the following symptoms occur over the next 12 hours:  Shaking chills.  You have a temperature by mouth above 102 F (38.9 C), not controlled by  medicine.  Chest, back, or muscle pain.  People around you feel you are not acting correctly or are confused.  Shortness of breath or difficulty breathing.  Dizziness and fainting.  You get a rash or develop hives.  You have a decrease in urine output.  Your urine turns a dark color or changes to pink, red, or brown. Any of the following symptoms occur over the next 10 days:  You have a temperature by mouth above 102 F (38.9 C), not controlled by medicine.  Shortness of breath.  Weakness after normal activity.  The white part of the eye turns yellow (jaundice).  You have a decrease in the amount of urine or are urinating less often.  Your urine turns a dark color or changes to pink, red, or brown. Document Released: 06/07/2000 Document Revised: 09/02/2011 Document Reviewed: 01/25/2008 [facility] Patient Information 2014 [place], [place].  _______________________________________________________________________

## 2014-05-03 NOTE — Progress Notes (Signed)
CXR epic 12/20/2013 EKG epic 11/08/2013 ECHO epic 09/08/2013 Stress test 10/12/2013 LOV Dr [name] epic 02/22/2014 (cardiology) Pulmonary Dr Halford Chessman epic 01/31/2014  LOV 04/04/2014 Dr Charlett Blake epic  Clearance note chart Dr [name] 04/18/2014 Clearance note chart Dr Gracelyn Nurse 04/12/2014

## 2014-05-09 MED ORDER — CEFAZOLIN SODIUM 10 G IJ SOLR
3.0000 g | INTRAMUSCULAR | Status: AC
  Administered 2014-05-10: 3 g via INTRAVENOUS
  Filled 2014-05-09: qty 3000

## 2014-05-10 ENCOUNTER — Inpatient Hospital Stay (HOSPITAL_COMMUNITY)
Admission: RE | Admit: 2014-05-10 | Discharge: 2014-05-11 | DRG: 470 | Disposition: A | Discharge status: Home Health | Payer: 59 | Source: Ambulatory Visit | Attending: Orthopedic Surgery | Admitting: Orthopedic Surgery

## 2014-05-10 ENCOUNTER — Encounter (HOSPITAL_COMMUNITY): Payer: Self-pay | Admitting: *Deleted

## 2014-05-10 ENCOUNTER — Inpatient Hospital Stay (HOSPITAL_COMMUNITY): Payer: 59 | Admitting: *Deleted

## 2014-05-10 ENCOUNTER — Encounter (HOSPITAL_COMMUNITY)
Admission: RE | Disposition: A | Discharge status: Home Health | Payer: Self-pay | Source: Ambulatory Visit | Attending: Orthopedic Surgery

## 2014-05-10 DIAGNOSIS — Z96659 Presence of unspecified artificial knee joint: Secondary | ICD-10-CM

## 2014-05-10 DIAGNOSIS — E785 Hyperlipidemia, unspecified: Secondary | ICD-10-CM | POA: Diagnosis present

## 2014-05-10 DIAGNOSIS — Z6841 Body Mass Index (BMI) 40.0 and over, adult: Secondary | ICD-10-CM | POA: Diagnosis not present

## 2014-05-10 DIAGNOSIS — G4733 Obstructive sleep apnea (adult) (pediatric): Secondary | ICD-10-CM | POA: Diagnosis present

## 2014-05-10 DIAGNOSIS — Z01812 Encounter for preprocedural laboratory examination: Secondary | ICD-10-CM

## 2014-05-10 DIAGNOSIS — K219 Gastro-esophageal reflux disease without esophagitis: Secondary | ICD-10-CM | POA: Diagnosis present

## 2014-05-10 DIAGNOSIS — M659 Synovitis and tenosynovitis, unspecified: Secondary | ICD-10-CM | POA: Diagnosis present

## 2014-05-10 DIAGNOSIS — M1711 Unilateral primary osteoarthritis, right knee: Secondary | ICD-10-CM | POA: Diagnosis present

## 2014-05-10 DIAGNOSIS — I5032 Chronic diastolic (congestive) heart failure: Secondary | ICD-10-CM | POA: Diagnosis present

## 2014-05-10 DIAGNOSIS — I10 Essential (primary) hypertension: Secondary | ICD-10-CM | POA: Diagnosis present

## 2014-05-10 DIAGNOSIS — Z96651 Presence of right artificial knee joint: Secondary | ICD-10-CM

## 2014-05-10 DIAGNOSIS — I2510 Atherosclerotic heart disease of native coronary artery without angina pectoris: Secondary | ICD-10-CM | POA: Diagnosis present

## 2014-05-10 DIAGNOSIS — M25561 Pain in right knee: Secondary | ICD-10-CM | POA: Diagnosis present

## 2014-05-10 HISTORY — PX: TOTAL KNEE ARTHROPLASTY: SHX125

## 2014-05-10 LAB — TYPE AND SCREEN
ABO/RH(D): A POS
ANTIBODY SCREEN: NEGATIVE

## 2014-05-10 SURGERY — ARTHROPLASTY, KNEE, TOTAL
Anesthesia: Spinal | Site: Knee | Laterality: Right

## 2014-05-10 MED ORDER — ESCITALOPRAM OXALATE 20 MG PO TABS
20.0000 mg | ORAL_TABLET | Freq: Every day | ORAL | Status: DC
  Administered 2014-05-11: 20 mg via ORAL
  Filled 2014-05-10: qty 1

## 2014-05-10 MED ORDER — SODIUM CHLORIDE 0.9 % IR SOLN
Status: DC | PRN
  Administered 2014-05-10: 1000 mL

## 2014-05-10 MED ORDER — LOSARTAN POTASSIUM 50 MG PO TABS
100.0000 mg | ORAL_TABLET | Freq: Every day | ORAL | Status: DC
  Administered 2014-05-10 – 2014-05-11 (×2): 100 mg via ORAL
  Filled 2014-05-10 (×2): qty 2

## 2014-05-10 MED ORDER — DILTIAZEM HCL ER COATED BEADS 120 MG PO CP24
120.0000 mg | ORAL_CAPSULE | Freq: Every day | ORAL | Status: DC
  Administered 2014-05-11: 120 mg via ORAL
  Filled 2014-05-10: qty 1

## 2014-05-10 MED ORDER — HYDROMORPHONE HCL 1 MG/ML IJ SOLN
0.5000 mg | INTRAMUSCULAR | Status: DC | PRN
  Administered 2014-05-10 (×2): 1 mg via INTRAVENOUS
  Filled 2014-05-10 (×2): qty 1

## 2014-05-10 MED ORDER — METHOCARBAMOL 500 MG PO TABS
500.0000 mg | ORAL_TABLET | Freq: Four times a day (QID) | ORAL | Status: DC | PRN
  Administered 2014-05-10 – 2014-05-11 (×2): 500 mg via ORAL
  Filled 2014-05-10 (×2): qty 1

## 2014-05-10 MED ORDER — DEXAMETHASONE SODIUM PHOSPHATE 10 MG/ML IJ SOLN
INTRAMUSCULAR | Status: AC
  Filled 2014-05-10: qty 1

## 2014-05-10 MED ORDER — POTASSIUM CHLORIDE 2 MEQ/ML IV SOLN
INTRAVENOUS | Status: DC
  Administered 2014-05-10: 19:00:00 via INTRAVENOUS
  Filled 2014-05-10 (×3): qty 1000

## 2014-05-10 MED ORDER — PANTOPRAZOLE SODIUM 40 MG PO TBEC
40.0000 mg | DELAYED_RELEASE_TABLET | Freq: Two times a day (BID) | ORAL | Status: DC
  Administered 2014-05-10 – 2014-05-11 (×2): 40 mg via ORAL
  Filled 2014-05-10 (×3): qty 1

## 2014-05-10 MED ORDER — NEBIVOLOL HCL 10 MG PO TABS
10.0000 mg | ORAL_TABLET | Freq: Every day | ORAL | Status: DC
  Administered 2014-05-10: 10 mg via ORAL
  Filled 2014-05-10 (×2): qty 1

## 2014-05-10 MED ORDER — KETOROLAC TROMETHAMINE 30 MG/ML IJ SOLN
INTRAMUSCULAR | Status: DC | PRN
  Administered 2014-05-10: 30 mg via INTRAVENOUS

## 2014-05-10 MED ORDER — ALBUTEROL SULFATE (2.5 MG/3ML) 0.083% IN NEBU: 2.5000 mg | INHALATION_SOLUTION | Freq: Four times a day (QID) | RESPIRATORY_TRACT | Status: DC | PRN

## 2014-05-10 MED ORDER — METOPROLOL TARTRATE 1 MG/ML IV SOLN
INTRAVENOUS | Status: DC | PRN
Start: 2014-05-10 — End: 2014-05-10
  Administered 2014-05-10 (×2): 1 mg via INTRAVENOUS
  Administered 2014-05-10: 2 mg via INTRAVENOUS
  Administered 2014-05-10 (×4): 1 mg via INTRAVENOUS

## 2014-05-10 MED ORDER — FLUTICASONE PROPIONATE HFA 44 MCG/ACT IN AERO: 2.0000 | INHALATION_SPRAY | Freq: Two times a day (BID) | RESPIRATORY_TRACT | Status: DC | PRN

## 2014-05-10 MED ORDER — CEFAZOLIN SODIUM-DEXTROSE 2-3 GM-% IV SOLR
2.0000 g | Freq: Four times a day (QID) | INTRAVENOUS | Status: AC
  Administered 2014-05-10 – 2014-05-11 (×2): 2 g via INTRAVENOUS
  Filled 2014-05-10 (×2): qty 50

## 2014-05-10 MED ORDER — PHENOL 1.4 % MT LIQD: 1.0000 | OROMUCOSAL | Status: DC | PRN

## 2014-05-10 MED ORDER — ALUM & MAG HYDROXIDE-SIMETH 200-200-20 MG/5ML PO SUSP: 30.0000 mL | ORAL | Status: DC | PRN

## 2014-05-10 MED ORDER — ASPIRIN EC 325 MG PO TBEC
325.0000 mg | DELAYED_RELEASE_TABLET | Freq: Two times a day (BID) | ORAL | Status: DC
  Administered 2014-05-11: 325 mg via ORAL
  Filled 2014-05-10 (×3): qty 1

## 2014-05-10 MED ORDER — METOPROLOL TARTRATE 1 MG/ML IV SOLN: INTRAVENOUS | Status: DC | PRN

## 2014-05-10 MED ORDER — ONDANSETRON HCL 4 MG/2ML IJ SOLN
INTRAMUSCULAR | Status: DC | PRN
  Administered 2014-05-10: 4 mg via INTRAVENOUS

## 2014-05-10 MED ORDER — BUPIVACAINE-EPINEPHRINE (PF) 0.25% -1:200000 IJ SOLN
INTRAMUSCULAR | Status: DC | PRN
  Administered 2014-05-10: 30 mL

## 2014-05-10 MED ORDER — ONDANSETRON HCL 4 MG/2ML IJ SOLN
4.0000 mg | Freq: Four times a day (QID) | INTRAMUSCULAR | Status: DC | PRN
Start: 2014-05-10 — End: 2014-05-11

## 2014-05-10 MED ORDER — BISACODYL 10 MG RE SUPP: 10.0000 mg | Freq: Every day | RECTAL | Status: DC | PRN

## 2014-05-10 MED ORDER — FERROUS SULFATE 325 (65 FE) MG PO TABS
325.0000 mg | ORAL_TABLET | Freq: Three times a day (TID) | ORAL | Status: DC
  Administered 2014-05-11 (×2): 325 mg via ORAL
  Filled 2014-05-10 (×5): qty 1

## 2014-05-10 MED ORDER — HYDROCODONE-ACETAMINOPHEN 7.5-325 MG PO TABS
1.0000 | ORAL_TABLET | ORAL | Status: DC
  Administered 2014-05-10 – 2014-05-11 (×6): 2 via ORAL
  Filled 2014-05-10 (×6): qty 2

## 2014-05-10 MED ORDER — MIDAZOLAM HCL 5 MG/5ML IJ SOLN
INTRAMUSCULAR | Status: DC | PRN
  Administered 2014-05-10: .1 mg via INTRAVENOUS
  Administered 2014-05-10 (×2): 1 mg via INTRAVENOUS
  Administered 2014-05-10: .1 mg via INTRAVENOUS
  Administered 2014-05-10: .2 mg via INTRAVENOUS
  Administered 2014-05-10: .1 mg via INTRAVENOUS
  Administered 2014-05-10: .2 mg via INTRAVENOUS
  Administered 2014-05-10: .1 mg via INTRAVENOUS
  Administered 2014-05-10: .2 mg via INTRAVENOUS

## 2014-05-10 MED ORDER — SODIUM CHLORIDE 0.9 % IJ SOLN
INTRAMUSCULAR | Status: DC | PRN
  Administered 2014-05-10: 30 mL via INTRAVENOUS

## 2014-05-10 MED ORDER — 0.9 % SODIUM CHLORIDE (POUR BTL) OPTIME
TOPICAL | Status: DC | PRN
Start: 2014-05-10 — End: 2014-05-10
  Administered 2014-05-10: 1000 mL

## 2014-05-10 MED ORDER — LACTATED RINGERS IV SOLN
INTRAVENOUS | Status: DC
  Administered 2014-05-10: 1000 mL via INTRAVENOUS

## 2014-05-10 MED ORDER — METOCLOPRAMIDE HCL 5 MG/ML IJ SOLN
INTRAMUSCULAR | Status: DC | PRN
  Administered 2014-05-10: 10 mg via INTRAVENOUS

## 2014-05-10 MED ORDER — DIPHENHYDRAMINE HCL 25 MG PO CAPS: 25.0000 mg | ORAL_CAPSULE | Freq: Four times a day (QID) | ORAL | Status: DC | PRN

## 2014-05-10 MED ORDER — MIDAZOLAM HCL 2 MG/2ML IJ SOLN
INTRAMUSCULAR | Status: AC
  Filled 2014-05-10: qty 2

## 2014-05-10 MED ORDER — METOCLOPRAMIDE HCL 10 MG PO TABS: 5.0000 mg | ORAL_TABLET | Freq: Three times a day (TID) | ORAL | Status: DC | PRN

## 2014-05-10 MED ORDER — ONDANSETRON HCL 4 MG PO TABS: 4.0000 mg | ORAL_TABLET | Freq: Four times a day (QID) | ORAL | Status: DC | PRN

## 2014-05-10 MED ORDER — GLYCOPYRROLATE 0.2 MG/ML IJ SOLN
INTRAMUSCULAR | Status: AC
  Filled 2014-05-10: qty 1

## 2014-05-10 MED ORDER — CLOTRIMAZOLE-BETAMETHASONE 1-0.05 % EX CREA
1.0000 "application " | TOPICAL_CREAM | Freq: Two times a day (BID) | CUTANEOUS | Status: DC | PRN
  Filled 2014-05-10: qty 15

## 2014-05-10 MED ORDER — GLYCOPYRROLATE 0.2 MG/ML IJ SOLN
INTRAMUSCULAR | Status: DC | PRN
  Administered 2014-05-10 (×2): 0.1 mg via INTRAVENOUS

## 2014-05-10 MED ORDER — TRANEXAMIC ACID 100 MG/ML IV SOLN
1000.0000 mg | Freq: Once | INTRAVENOUS | Status: AC
  Administered 2014-05-10: 1000 mg via INTRAVENOUS
  Filled 2014-05-10: qty 10

## 2014-05-10 MED ORDER — HYDROMORPHONE HCL 1 MG/ML IJ SOLN: 0.2500 mg | INTRAMUSCULAR | Status: DC | PRN

## 2014-05-10 MED ORDER — POLYETHYLENE GLYCOL 3350 17 G PO PACK
17.0000 g | PACK | Freq: Two times a day (BID) | ORAL | Status: DC
  Administered 2014-05-10 – 2014-05-11 (×2): 17 g via ORAL

## 2014-05-10 MED ORDER — SODIUM CHLORIDE 0.9 % IJ SOLN
INTRAMUSCULAR | Status: AC
  Filled 2014-05-10: qty 50

## 2014-05-10 MED ORDER — CHLORHEXIDINE GLUCONATE 4 % EX LIQD: 60.0000 mL | Freq: Once | CUTANEOUS | Status: DC

## 2014-05-10 MED ORDER — PROPOFOL 10 MG/ML IV BOLUS
INTRAVENOUS | Status: AC
  Filled 2014-05-10: qty 20

## 2014-05-10 MED ORDER — DEXAMETHASONE SODIUM PHOSPHATE 10 MG/ML IJ SOLN
10.0000 mg | Freq: Once | INTRAMUSCULAR | Status: AC
  Administered 2014-05-11: 10 mg via INTRAVENOUS
  Filled 2014-05-10: qty 1

## 2014-05-10 MED ORDER — LIDOCAINE HCL (CARDIAC) 20 MG/ML IV SOLN
INTRAVENOUS | Status: DC | PRN
  Administered 2014-05-10: 50 mg via INTRAVENOUS

## 2014-05-10 MED ORDER — METOCLOPRAMIDE HCL 5 MG/ML IJ SOLN
5.0000 mg | Freq: Three times a day (TID) | INTRAMUSCULAR | Status: DC | PRN
Start: 2014-05-10 — End: 2014-05-11

## 2014-05-10 MED ORDER — KETOROLAC TROMETHAMINE 30 MG/ML IJ SOLN
INTRAMUSCULAR | Status: AC
  Filled 2014-05-10: qty 1

## 2014-05-10 MED ORDER — METOPROLOL TARTRATE 1 MG/ML IV SOLN
INTRAVENOUS | Status: AC
  Filled 2014-05-10: qty 5

## 2014-05-10 MED ORDER — LORATADINE 10 MG PO TABS
10.0000 mg | ORAL_TABLET | Freq: Every day | ORAL | Status: DC
  Administered 2014-05-11: 10 mg via ORAL
  Filled 2014-05-10: qty 1

## 2014-05-10 MED ORDER — ROSUVASTATIN CALCIUM 20 MG PO TABS
20.0000 mg | ORAL_TABLET | Freq: Every day | ORAL | Status: DC
  Administered 2014-05-10: 20 mg via ORAL
  Filled 2014-05-10 (×2): qty 1

## 2014-05-10 MED ORDER — BUPIVACAINE IN DEXTROSE 0.75-8.25 % IT SOLN
INTRATHECAL | Status: DC | PRN
  Administered 2014-05-10: 2 mL via INTRATHECAL

## 2014-05-10 MED ORDER — BUPIVACAINE-EPINEPHRINE (PF) 0.25% -1:200000 IJ SOLN
INTRAMUSCULAR | Status: AC
  Filled 2014-05-10: qty 30

## 2014-05-10 MED ORDER — FUROSEMIDE 40 MG PO TABS
40.0000 mg | ORAL_TABLET | Freq: Every day | ORAL | Status: DC
  Administered 2014-05-11: 40 mg via ORAL
  Filled 2014-05-10 (×2): qty 1

## 2014-05-10 MED ORDER — ONDANSETRON HCL 4 MG/2ML IJ SOLN
INTRAMUSCULAR | Status: AC
  Filled 2014-05-10: qty 2

## 2014-05-10 MED ORDER — METOCLOPRAMIDE HCL 5 MG/ML IJ SOLN
INTRAMUSCULAR | Status: AC
  Filled 2014-05-10: qty 2

## 2014-05-10 MED ORDER — DEXAMETHASONE SODIUM PHOSPHATE 10 MG/ML IJ SOLN
INTRAMUSCULAR | Status: DC | PRN
  Administered 2014-05-10: 10 mg via INTRAVENOUS

## 2014-05-10 MED ORDER — PROMETHAZINE HCL 25 MG/ML IJ SOLN: 6.2500 mg | INTRAMUSCULAR | Status: DC | PRN

## 2014-05-10 MED ORDER — PROPOFOL INFUSION 10 MG/ML OPTIME
INTRAVENOUS | Status: DC | PRN
  Administered 2014-05-10: 200 ug/kg/min via INTRAVENOUS

## 2014-05-10 MED ORDER — MAGNESIUM CITRATE PO SOLN: 1.0000 | Freq: Once | ORAL | Status: AC | PRN

## 2014-05-10 MED ORDER — MENTHOL 3 MG MT LOZG
1.0000 | LOZENGE | OROMUCOSAL | Status: DC | PRN
Start: 2014-05-10 — End: 2014-05-11

## 2014-05-10 MED ORDER — DEXAMETHASONE SODIUM PHOSPHATE 10 MG/ML IJ SOLN: 10.0000 mg | Freq: Once | INTRAMUSCULAR | Status: DC

## 2014-05-10 MED ORDER — PHENYLEPHRINE HCL 10 MG/ML IJ SOLN
INTRAMUSCULAR | Status: AC
  Filled 2014-05-10: qty 1

## 2014-05-10 MED ORDER — CELECOXIB 200 MG PO CAPS
200.0000 mg | ORAL_CAPSULE | Freq: Two times a day (BID) | ORAL | Status: DC
  Administered 2014-05-10 – 2014-05-11 (×2): 200 mg via ORAL
  Filled 2014-05-10 (×3): qty 1

## 2014-05-10 MED ORDER — DEXTROSE 5 % IV SOLN
10.0000 mg | INTRAVENOUS | Status: DC | PRN
  Administered 2014-05-10: 10 ug/min via INTRAVENOUS

## 2014-05-10 MED ORDER — DOCUSATE SODIUM 100 MG PO CAPS
100.0000 mg | ORAL_CAPSULE | Freq: Two times a day (BID) | ORAL | Status: DC
  Administered 2014-05-10 – 2014-05-11 (×2): 100 mg via ORAL

## 2014-05-10 MED ORDER — LIDOCAINE HCL (CARDIAC) 20 MG/ML IV SOLN
INTRAVENOUS | Status: AC
  Filled 2014-05-10: qty 5

## 2014-05-10 MED ORDER — METHOCARBAMOL 1000 MG/10ML IJ SOLN
500.0000 mg | Freq: Four times a day (QID) | INTRAVENOUS | Status: DC | PRN
  Filled 2014-05-10: qty 5

## 2014-05-10 MED ORDER — FLUTICASONE PROPIONATE 50 MCG/ACT NA SUSP
1.0000 | Freq: Every day | NASAL | Status: DC | PRN
  Filled 2014-05-10: qty 16

## 2014-05-10 SURGICAL SUPPLY — 54 items
ADH SKN CLS APL DERMABOND .7 (GAUZE/BANDAGES/DRESSINGS) ×1
BAG ZIPLOCK 12X15 (MISCELLANEOUS) IMPLANT
BANDAGE ELASTIC 6 VELCRO ST LF (GAUZE/BANDAGES/DRESSINGS) ×2 IMPLANT
BANDAGE ESMARK 6X9 LF (GAUZE/BANDAGES/DRESSINGS) ×1 IMPLANT
BLADE SAW SGTL 13.0X1.19X90.0M (BLADE) ×2 IMPLANT
BNDG ESMARK 6X9 LF (GAUZE/BANDAGES/DRESSINGS) ×2
BONE CEMENT GENTAMICIN (Cement) ×4 IMPLANT
BOWL SMART MIX CTS (DISPOSABLE) ×2 IMPLANT
CAP UPCHARGE REVISION TRAY ×2 IMPLANT
CAPT RP KNEE ×2 IMPLANT
CEMENT BONE GENTAMICIN 40 (Cement) ×2 IMPLANT
CUFF TOURN SGL QUICK 34 (TOURNIQUET CUFF) ×1
CUFF TRNQT CYL 34X4X40X1 (TOURNIQUET CUFF) ×1 IMPLANT
DECANTER SPIKE VIAL GLASS SM (MISCELLANEOUS) ×2 IMPLANT
DERMABOND ADVANCED (GAUZE/BANDAGES/DRESSINGS) ×1
DERMABOND ADVANCED .7 DNX12 (GAUZE/BANDAGES/DRESSINGS) ×1 IMPLANT
DRAPE EXTREMITY TIBURON (DRAPES) ×2 IMPLANT
DRAPE POUCH INSTRU U-SHP 10X18 (DRAPES) ×2 IMPLANT
DRAPE U-SHAPE 47X51 STRL (DRAPES) ×2 IMPLANT
DRSG AQUACEL AG ADV 3.5X10 (GAUZE/BANDAGES/DRESSINGS) ×2 IMPLANT
DURAPREP 26ML APPLICATOR (WOUND CARE) ×4 IMPLANT
ELECT REM PT RETURN 9FT ADLT (ELECTROSURGICAL) ×2
ELECTRODE REM PT RTRN 9FT ADLT (ELECTROSURGICAL) ×1 IMPLANT
FACESHIELD WRAPAROUND (MASK) ×10 IMPLANT
GLOVE BIOGEL PI IND STRL 7.5 (GLOVE) ×1 IMPLANT
GLOVE BIOGEL PI IND STRL 8.5 (GLOVE) ×1 IMPLANT
GLOVE BIOGEL PI INDICATOR 7.5 (GLOVE) ×1
GLOVE BIOGEL PI INDICATOR 8.5 (GLOVE) ×1
GLOVE ECLIPSE 8.0 STRL XLNG CF (GLOVE) ×2 IMPLANT
GLOVE ORTHO TXT STRL SZ7.5 (GLOVE) ×4 IMPLANT
GOWN SPEC L3 XXLG W/TWL (GOWN DISPOSABLE) ×2 IMPLANT
GOWN STRL REUS W/TWL LRG LVL3 (GOWN DISPOSABLE) ×2 IMPLANT
HANDPIECE INTERPULSE COAX TIP (DISPOSABLE) ×1
KIT BASIN OR (CUSTOM PROCEDURE TRAY) ×2 IMPLANT
LIQUID BAND (GAUZE/BANDAGES/DRESSINGS) ×2 IMPLANT
MANIFOLD NEPTUNE II (INSTRUMENTS) ×2 IMPLANT
NDL SAFETY ECLIPSE 18X1.5 (NEEDLE) ×1 IMPLANT
NEEDLE HYPO 18GX1.5 SHARP (NEEDLE) ×1
PACK TOTAL JOINT (CUSTOM PROCEDURE TRAY) ×2 IMPLANT
POSITIONER SURGICAL ARM (MISCELLANEOUS) ×2 IMPLANT
SET HNDPC FAN SPRY TIP SCT (DISPOSABLE) ×1 IMPLANT
SET PAD KNEE POSITIONER (MISCELLANEOUS) ×2 IMPLANT
SUCTION FRAZIER 12FR DISP (SUCTIONS) ×2 IMPLANT
SUT MNCRL AB 4-0 PS2 18 (SUTURE) ×2 IMPLANT
SUT VIC AB 1 CT1 36 (SUTURE) ×2 IMPLANT
SUT VIC AB 2-0 CT1 27 (SUTURE) ×3
SUT VIC AB 2-0 CT1 TAPERPNT 27 (SUTURE) ×3 IMPLANT
SUT VLOC 180 0 24IN GS25 (SUTURE) ×2 IMPLANT
SYR 50ML LL SCALE MARK (SYRINGE) ×2 IMPLANT
TOWEL OR 17X26 10 PK STRL BLUE (TOWEL DISPOSABLE) ×2 IMPLANT
TOWEL OR NON WOVEN STRL DISP B (DISPOSABLE) IMPLANT
TRAY FOLEY CATH 14FRSI W/METER (CATHETERS) ×2 IMPLANT
WATER STERILE IRR 1500ML POUR (IV SOLUTION) ×2 IMPLANT
WRAP KNEE MAXI GEL POST OP (GAUZE/BANDAGES/DRESSINGS) ×2 IMPLANT

## 2014-05-10 NOTE — Plan of Care (Signed)
Problem: Consults Goal: Total Joint Replacement Patient Education See Patient Education Module for education specifics. Outcome: Completed/Met Date Met:  05/10/14     

## 2014-05-10 NOTE — Anesthesia Preprocedure Evaluation (Addendum)
Anesthesia Evaluation  Patient identified by MRN, date of birth, ID band Patient awake    Reviewed: Allergy & Precautions, H&P , NPO status , Patient's Chart, lab work & pertinent test results  History of Anesthesia Complications (+) PONV and history of anesthetic complications  Airway Mallampati: II  TM Distance: >3 FB Neck ROM: Full    Dental no notable dental hx.    Pulmonary shortness of breath and with exertion, sleep apnea ,  breath sounds clear to auscultation  Pulmonary exam normal       Cardiovascular hypertension, Pt. on medications and Pt. on home beta blockers + CAD and +CHF + dysrhythmias + Valvular Problems/Murmurs Rhythm:Regular Rate:Normal     Neuro/Psych Anxiety negative neurological ROS     GI/Hepatic Neg liver ROS, GERD-  ,  Endo/Other  negative endocrine ROS  Renal/GU Renal disease  negative genitourinary   Musculoskeletal  (+) Arthritis -,   Abdominal   Peds negative pediatric ROS (+)  Hematology  (+) anemia ,   Anesthesia Other Findings   Reproductive/Obstetrics negative OB ROS                            Anesthesia Physical Anesthesia Plan  ASA: III  Anesthesia Plan: Spinal   Post-op Pain Management:    Induction: Intravenous  Airway Management Planned:   Additional Equipment:   Intra-op Plan:   Post-operative Plan:   Informed Consent: I have reviewed the patients History and Physical, chart, labs and discussed the procedure including the risks, benefits and alternatives for the proposed anesthesia with the patient or authorized representative who has indicated his/her understanding and acceptance.   Dental advisory given  Plan Discussed with: CRNA  Anesthesia Plan Comments: (Discussed spinal and general. Discussed risks/benefits of spinal including headache, backache, failure, bleeding, infection, and nerve damage. Patient consents to spinal.  Questions answered. Coagulation studies and platelet count acceptable.)        Anesthesia Quick Evaluation

## 2014-05-10 NOTE — Interval H&P Note (Signed)
History and Physical Interval Note:  05/10/2014 11:36 AM  [name]  has presented today for surgery, with the diagnosis of RIGHT KNEE OA  The various methods of treatment have been discussed with the patient and family. After consideration of risks, benefits and other options for treatment, the patient has consented to  Procedure(s): RIGHT TOTAL KNEE ARTHROPLASTY (Right) as a surgical intervention .  The patient's history has been reviewed, patient examined, no change in status, stable for surgery.  I have reviewed the patient's chart and labs.  Questions were answered to the patient's satisfaction.     Mauri Pole

## 2014-05-10 NOTE — Anesthesia Postprocedure Evaluation (Signed)
  Anesthesia Post-op Note  Patient: [name]  Procedure(s) Performed: Procedure(s) (LRB): RIGHT TOTAL KNEE ARTHROPLASTY (Right)  Patient Location: PACU  Anesthesia Type: Spinal  Level of Consciousness: awake and alert   Airway and Oxygen Therapy: Patient Spontanous Breathing  Post-op Pain: mild  Post-op Assessment: Post-op Vital signs reviewed, Patient's Cardiovascular Status Stable, Respiratory Function Stable, Patent Airway and No signs of Nausea or vomiting  Last Vitals:  Filed Vitals:   05/10/14 1646  BP: 137/76  Pulse: 70  Temp: 36.8 C  Resp: 16    Post-op Vital Signs: stable   Complications: No apparent anesthesia complications

## 2014-05-10 NOTE — Progress Notes (Signed)
RT placed patient on CPAP. Home setting is Auto titrate ( min 5 and max 15 ). Sterile water added to water chamber for humidification. 2 liters oxygen bleed into tubing.Patient tolerating well at this time. RT will continue to monitor as needed.

## 2014-05-10 NOTE — Op Note (Signed)
NAME:  [name] RECORD NO.:  [id number]                             FACILITY:  [facility]      PHYSICIAN:  Pietro Cassis. Alvan Dame, M.D.  DATE OF BIRTH:  08-Jan-1951      DATE OF PROCEDURE:  05/10/2014                                     OPERATIVE REPORT         PREOPERATIVE DIAGNOSIS:  Right knee primary osteoarthritis.      POSTOPERATIVE DIAGNOSIS: 1.  Right knee primary osteoarthritis. 2. Morbid obesity (BMI - 51)     FINDINGS:  The patient was noted to have complete loss of cartilage and   bone-on-bone arthritis with associated osteophytes in all three compartments of   the knee with a significant synovitis and associated effusion.      PROCEDURE:  Right total knee replacement.      COMPONENTS USED:  DePuy rotating platform posterior stabilized knee   system, a size 3 PS femur, 3 MBT revision tibia, 15 mm PS insert, and 35 patellar   button.      SURGEON:  Pietro Cassis. Alvan Dame, M.D.      ASSISTANT:  Danae Orleans, PA-C.      ANESTHESIA:  Spinal.      SPECIMENS:  None.      COMPLICATION:  None.      DRAINS:  None.  EBL: <100cc      TOURNIQUET TIME:   Total Tourniquet Time Documented: Thigh (Right) - 41 minutes Total: Thigh (Right) - 41 minutes  .      The patient was stable to the recovery room.      INDICATION FOR PROCEDURE:  [name] is a 63 y.o. female patient of   mine.  The patient had been seen, evaluated, and treated conservatively in the   office with medication, activity modification, and injections.  The patient had   radiographic changes of bone-on-bone arthritis with endplate sclerosis and osteophytes noted.      The patient failed conservative measures including medication, injections, and activity modification, and at this point was ready for more definitive measures.   Based on the radiographic changes and failed conservative measures, the patient   decided to proceed with total knee replacement.  Risks of  infection,   DVT, component failure, need for revision surgery, postop course, and   expectations were all   discussed and reviewed.  Consent was obtained for benefit of pain   relief.      PROCEDURE IN DETAIL:  The patient was brought to the operative theater.   Once adequate anesthesia, preoperative antibiotics, 3 gm of Ancef administered, the patient was positioned supine with the right thigh tourniquet placed.  The  right lower extremity was prepped and draped in sterile fashion.  A time-   out was performed identifying the patient, planned procedure, and   extremity.      The right lower extremity was placed in the [facility] leg holder.  The leg was   exsanguinated, tourniquet elevated to 250 mmHg.  A midline incision was   made followed by median parapatellar arthrotomy.  Following initial   exposure, attention was first directed to the patella.  Precut   measurement was noted to be 22 mm.  I resected down to 14 mm and used a   35 patellar button to restore patellar height as well as cover the cut   surface.      The lug holes were drilled and a metal shim was placed to protect the   patella from retractors and saw blades.      At this point, attention was now directed to the femur.  The femoral   canal was opened with a drill, irrigated to try to prevent fat emboli.  An   intramedullary rod was passed at 3 degrees valgus, 10 mm of bone was   resected off the distal femur.  Following this resection, the tibia was   subluxated anteriorly.  Using the extramedullary guide, 2 mm of bone was resected off   the proximal medial tibia.  We confirmed the gap would be   stable medially and laterally with a 10 mm insert as well as confirmed   the cut was perpendicular in the coronal plane, checking with an alignment rod.      Once this was done, I sized the femur to be a size 3 in the anterior-   posterior dimension, chose a standard component based on medial and   lateral dimension.  The  size 3 rotation block was then pinned in   position anterior referenced using the C-clamp to set rotation.  The   anterior, posterior, and  chamfer cuts were made without difficulty nor   notching making certain that I was along the anterior cortex to help   with flexion gap stability.      The final box cut was made off the lateral aspect of distal femur.      At this point, the tibia was sized to be a size 3, the size 3 tray was   then pinned in position through the medial third of the tubercle,   drilled, and prepared for the revision tray due to her size.  Trial reduction was now carried with a 3 femur,  3 MBT revision tibia, a 12.5 the 15 mm insert, and the 35 patella botton.  The knee was brought to   extension, full extension with good flexion stability with the patella   tracking through the trochlea without application of pressure.  Given   all these findings, the trial components removed.  Final components were   opened and cement was mixed.  The knee was irrigated with normal saline   solution and pulse lavage.  The synovial lining was   then injected with 30cc of 0.25% Marcaine with epinephrine and 1 cc of Toradol plus 30cc of NS for a  total of 61 cc.      The knee was irrigated.  Final implants were then cemented onto clean and   dried cut surfaces of bone with the knee brought to extension with a 15 mm trial insert.      Once the cement had fully cured, the excess cement was removed   throughout the knee.  I confirmed I was satisfied with the range of   motion and stability, and the final 15 mm PS insert was chosen.  It was   placed into the knee.      The tourniquet had been let down at 41 minutes.  No significant   hemostasis required.  The   extensor  mechanism was then reapproximated using #1 Vicryl and #0 V-lock sutureswith the knee   in flexion.  The   remaining wound was closed with 2-0 Vicryl and running 4-0 Monocryl.   The knee was cleaned, dried, dressed  sterilely using Dermabond and   Aquacel dressing.  The patient was then   brought to recovery room in stable condition, tolerating the procedure   well.   Please note that Physician Assistant, Danae Orleans, PA-C, was present for the entirety of the case, and was utilized for pre-operative positioning, peri-operative retractor management, general facilitation of the procedure.  He was also utilized for primary wound closure at the end of the case.              Pietro Cassis Alvan Dame, M.D.    05/10/2014 2:16 PM

## 2014-05-10 NOTE — Transfer of Care (Signed)
Immediate Anesthesia Transfer of Care Note  Patient: [name]  Procedure(s) Performed: Procedure(s): RIGHT TOTAL KNEE ARTHROPLASTY (Right)  Patient Location: PACU  Anesthesia Type:MAC and Spinal  Level of Consciousness: Patient easily awoken, sedated, comfortable, cooperative, following commands, responds to stimulation.   Airway & Oxygen Therapy: Patient spontaneously breathing, ventilating well, oxygen via simple oxygen mask.  Post-op Assessment: Report given to PACU RN, vital signs reviewed and stable, spinal L1.   Post vital signs: Reviewed and stable.  Complications: No apparent anesthesia complications

## 2014-05-10 NOTE — Anesthesia Procedure Notes (Signed)
Spinal Patient location during procedure: OR Staffing Anesthesiologist: Salley Scarlet Performed by: anesthesiologist  Preanesthetic Checklist Completed: patient identified, site marked, surgical consent, pre-op evaluation, timeout performed, IV checked, risks and benefits discussed and monitors and equipment checked Spinal Block Patient position: sitting Prep: Betadine Patient monitoring: heart rate, continuous pulse ox and blood pressure Approach: midline Location: L3-4 Injection technique: single-shot Needle Needle type: Sprotte  Needle gauge: 24 G Needle length: 10 cm Additional Notes Expiration date of kit checked and confirmed. Patient tolerated procedure well, without complications. Meaningful verbal contact maintained throughout spinal administration. CSF clear. No paresthesia.

## 2014-05-11 LAB — BASIC METABOLIC PANEL
Anion gap: 12 (ref 5–15)
BUN: 11 mg/dL (ref 6–23)
CHLORIDE: 100 meq/L (ref 96–112)
CO2: 27 mEq/L (ref 19–32)
CREATININE: 0.71 mg/dL (ref 0.50–1.10)
Calcium: 9.4 mg/dL (ref 8.4–10.5)
GFR calc Af Amer: >90 mL/min (ref >90)
GFR calc non Af Amer: 90 mL/min — ABNORMAL LOW (ref >90)
GLUCOSE: 161 mg/dL — AB (ref 70–99)
Potassium: 4.7 mEq/L (ref 3.7–5.3)
Sodium: 139 mEq/L (ref 137–147)

## 2014-05-11 LAB — CBC
HEMATOCRIT: 38.6 % (ref 36.0–46.0)
HEMOGLOBIN: 12.1 g/dL (ref 12.0–15.0)
MCH: 28.5 pg (ref 26.0–34.0)
MCHC: 31.3 g/dL (ref 30.0–36.0)
MCV: 90.8 fL (ref 78.0–100.0)
Platelets: 245 10*3/uL (ref 150–400)
RBC: 4.25 MIL/uL (ref 3.87–5.11)
RDW: 14.2 % (ref 11.5–15.5)
WBC: 12.5 10*3/uL — ABNORMAL HIGH (ref 4.0–10.5)

## 2014-05-11 MED ORDER — DSS 100 MG PO CAPS: 100.0000 mg | ORAL_CAPSULE | Freq: Two times a day (BID) | ORAL | Status: DC

## 2014-05-11 MED ORDER — ASPIRIN 325 MG PO TBEC: 325.0000 mg | DELAYED_RELEASE_TABLET | Freq: Two times a day (BID) | ORAL | Status: AC

## 2014-05-11 MED ORDER — METHOCARBAMOL 500 MG PO TABS: 500.0000 mg | ORAL_TABLET | Freq: Four times a day (QID) | ORAL | Status: DC | PRN

## 2014-05-11 MED ORDER — FERROUS SULFATE 325 (65 FE) MG PO TABS: 325.0000 mg | ORAL_TABLET | Freq: Three times a day (TID) | ORAL | Status: DC

## 2014-05-11 MED ORDER — POLYETHYLENE GLYCOL 3350 17 G PO PACK: 17.0000 g | PACK | Freq: Two times a day (BID) | ORAL | Status: DC

## 2014-05-11 MED ORDER — HYDROCODONE-ACETAMINOPHEN 7.5-325 MG PO TABS: 1.0000 | ORAL_TABLET | ORAL | Status: DC | PRN

## 2014-05-11 NOTE — Plan of Care (Signed)
Problem: Consults Goal: Diagnosis- Total Joint Replacement Outcome: Completed/Met Date Met:  05/11/14 Primary Total Knee RIGHT Goal: Skin Care Protocol Initiated - if Braden Score 18 or less If consults are not indicated, leave blank or document N/A  Outcome: Not Applicable Date Met:  95/32/02 Goal: Nutrition Consult-if indicated Outcome: Not Applicable Date Met:  33/43/56 Goal: Diabetes Guidelines if Diabetic/Glucose > 140 If diabetic or lab glucose is > 140 mg/dl - Initiate Diabetes/Hyperglycemia Guidelines & Document Interventions  Outcome: Not Applicable Date Met:  86/16/83  Problem: Phase I Progression Outcomes Goal: CMS/Neurovascular status WDL Outcome: Completed/Met Date Met:  05/11/14 Goal: Pain controlled with appropriate interventions Outcome: Completed/Met Date Met:  05/11/14 Goal: Dangle or out of bed evening of surgery Outcome: Completed/Met Date Met:  05/11/14 Goal: Initial discharge plan identified Outcome: Completed/Met Date Met:  05/11/14 Goal: Hemodynamically stable Outcome: Completed/Met Date Met:  05/11/14 Goal: Other Phase I Outcomes/Goals Outcome: Not Applicable Date Met:  72/90/21

## 2014-05-11 NOTE — Progress Notes (Signed)
     Subjective: 1 Day Post-Op Procedure(s) (LRB): RIGHT TOTAL KNEE ARTHROPLASTY (Right)   Seen by Dr. Alvan Dame. Patient reports pain as mild, pain controlled. No events throughout. Ready to be discharged home if she does well with PT and pain stays well controlled.   Objective:   VITALS:   Filed Vitals:   05/11/14 0615  BP: 134/68  Pulse: 56  Temp: 97.6 F (36.4 C)  Resp: 20    Dorsiflexion/Plantar flexion intact Incision: dressing C/D/I No cellulitis present Compartment soft  LABS  Recent Labs  05/11/14 0435  HGB 12.1  HCT 38.6  WBC 12.5*  PLT 245     Recent Labs  05/11/14 0435  NA 139  K 4.7  BUN 11  CREATININE 0.71  GLUCOSE 161*     Assessment/Plan: 1 Day Post-Op Procedure(s) (LRB): RIGHT TOTAL KNEE ARTHROPLASTY (Right) Foley cath d/c'ed Advance diet Up with therapy D/C IV fluids Discharge home with home health  Morbid Obesity (BMI >40)  Estimated body mass index is 51.35 kg/(m^2) as calculated from the following:   Height as of this encounter: 5\' 6"  (1.676 m).   Weight as of this encounter: 144.244 kg (318 lb). Patient also counseled that weight may inhibit the healing process Patient counseled that losing weight will help with future health issues       West Pugh. Detrick Dani   PAC  05/11/2014, 9:12 AM

## 2014-05-11 NOTE — Progress Notes (Signed)
OT Cancellation Note  Patient Details Name: [name] MRN: [id number] DOB: 09/05/1950   Cancelled Treatment:    Reason Eval/Treat Not Completed: OT screened, no needs identified, will sign off  Spoke with pt regarding OT role.  No needs at this time.    Betsy Pries 05/11/2014, 12:25 PM

## 2014-05-11 NOTE — Progress Notes (Signed)
Physical Therapy Treatment Patient Details Name: [name] MRN: [id number] DOB: 09-Jun-1951 Today's Date: 06-05-2014    History of Present Illness R TKR    PT Comments    Pt progressing well.  Reviewed stairs with written instructions provided.  Follow Up Recommendations  Home health PT     Equipment Recommendations  None recommended by PT    Recommendations for Other Services OT consult     Precautions / Restrictions Precautions Precautions: Knee;Fall Restrictions Weight Bearing Restrictions: No Other Position/Activity Restrictions: WBAT    Mobility  Bed Mobility Overal bed mobility: Needs Assistance Bed Mobility: Supine to Sit     Supine to sit: Min guard     General bed mobility comments: cues for sequence and use of L LE to self assist.   Transfers Overall transfer level: Needs assistance Equipment used: Rolling walker (2 wheeled) Transfers: Sit to/from Stand Sit to Stand: Min guard;Supervision         General transfer comment: cues for LE management and use of UEs to self assist  Ambulation/Gait Ambulation/Gait assistance: Min guard;Supervision Ambulation Distance (Feet): 100 Feet (and 20' from bathroom) Assistive device: Rolling walker (2 wheeled) Gait Pattern/deviations: Step-to pattern;Decreased step length - right;Decreased step length - left;Shuffle;Trunk flexed Gait velocity: decr   General Gait Details: cues for posture, position from RW and sequence   Stairs            Wheelchair Mobility    Modified Rankin (Stroke Patients Only)       Balance                                    Cognition Arousal/Alertness: Awake/alert Behavior During Therapy: WFL for tasks assessed/performed Overall Cognitive Status: Within Functional Limits for tasks assessed                      Exercises      General Comments        Pertinent Vitals/Pain Pain Assessment: 0-10 Pain Score: 6  Pain Location: R  knee Pain Descriptors / Indicators: Aching;Sore Pain Intervention(s): Limited activity within patient's tolerance;Monitored during session;Premedicated before session;Ice applied    Home Living                      Prior Function            PT Goals (current goals can now be found in the care plan section) Acute Rehab PT Goals Patient Stated Goal: Resume previous lifestyle with decreased pain PT Goal Formulation: With patient Time For Goal Achievement: 05/18/14 Potential to Achieve Goals: Good    Frequency  7X/week    PT Plan Current plan remains appropriate    Co-evaluation             End of Session Equipment Utilized During Treatment: Gait belt Activity Tolerance: Patient tolerated treatment well Patient left: in chair;with call bell/phone within reach;with family/visitor present     Time: 1417-1440 PT Time Calculation (min) (ACUTE ONLY): 23 min  Charges:  $Gait Training: 8-22 mins $Therapeutic Activity: 8-22 mins                    G Codes:      Melbert Botelho 06/05/14, 5:42 PM

## 2014-05-11 NOTE — Care Management Note (Addendum)
    Page 1 of 2   05/11/2014     2:07:38 PM CARE MANAGEMENT NOTE 05/11/2014  Patient:  Lohr,Locklyn A   Account Number:  [id number]  Date Initiated:  05/11/2014  Documentation initiated by:  [facility]  Subjective/Objective Assessment:   adm: RIGHT TOTAL KNEE ARTHROPLASTY (Right)     Action/Plan:   discharge planning   Anticipated DC Date:  05/11/2014   Anticipated DC Plan:  [place]  CM consult      [facility] Choice  HOME HEALTH   Choice offered to / List presented to:  C-1 Patient   DME arranged  3-N-1  Vassie Moselle      DME agency  [place] arranged  [place].   Status of service:  Completed, signed off Medicare Important Message given?   (If response is "NO", the following Medicare IM given date fields will be blank) Date Medicare IM given:   Medicare IM given by:   Date Additional Medicare IM given:   Additional Medicare IM given by:    Discharge Disposition:  [place]  Per UR Regulation:  Reviewed for med. necessity/level of care/duration of stay  If discussed at [place] of Stay Meetings, dates discussed:    Comments:  05/11/14 07:40 CM met with pt in room to offer choice of home health agency. Pt chooses [facility] to render HHPT. Address and contact information verified with pt.  Pt will be going to  [place].   Lecretia of [facility] delivered 3n1 and rolling walker to room.  Referral called to [facility] for HHPT.  No other CM needs were communicated.  Mariane Masters, BSN, CM [phone].

## 2014-05-11 NOTE — Evaluation (Signed)
Physical Therapy Evaluation Patient Details Name: [name] MRN: [id number] DOB: 1951/02/06 Today's Date: 05/11/2014   History of Present Illness  R TKR  Clinical Impression  Pt s/p R TKR presents with decreased R LE strength/ROM and post op pain limiting functional mobility.  Pt should progress to dc home with family assist and HHPT follow up.   Follow Up Recommendations Home health PT    Equipment Recommendations  None recommended by PT    Recommendations for Other Services OT consult     Precautions / Restrictions Precautions Precautions: Knee;Fall Restrictions Weight Bearing Restrictions: No Other Position/Activity Restrictions: WBAT      Mobility  Bed Mobility Overal bed mobility: Needs Assistance Bed Mobility: Supine to Sit     Supine to sit: Min assist;Mod assist     General bed mobility comments: cues for sequence and use of L LE to self assist.  Physical assist with R LE and to bring trunk to upright  Transfers Overall transfer level: Needs assistance Equipment used: Rolling walker (2 wheeled) Transfers: Sit to/from Stand Sit to Stand: Min assist         General transfer comment: cues for LE management and use of UEs to self assist  Ambulation/Gait Ambulation/Gait assistance: Min assist Ambulation Distance (Feet): 60 Feet Assistive device: Rolling walker (2 wheeled) Gait Pattern/deviations: Step-to pattern;Decreased step length - right;Decreased step length - left;Shuffle;Trunk flexed Gait velocity: decr   General Gait Details: cues for posture, position from RW and sequence  Stairs            Wheelchair Mobility    Modified Rankin (Stroke Patients Only)       Balance                                             Pertinent Vitals/Pain Pain Assessment: 0-10 Pain Score: 5  Pain Location: R knee Pain Descriptors / Indicators: Aching;Sore Pain Intervention(s): Limited activity within patient's  tolerance;Monitored during session;Premedicated before session;Ice applied    Home Living Family/patient expects to be discharged to:: Private residence Living Arrangements: Parent Available Help at Discharge: Family Type of Home: House Home Access: Stairs to enter Entrance Stairs-Rails: None Technical brewer of Steps: 2 Home Layout: One level Home Equipment: Environmental consultant - 2 wheels Additional Comments: Going to daughters home    Prior Function Level of Independence: Independent               Hand Dominance        Extremity/Trunk Assessment   Upper Extremity Assessment: Overall WFL for tasks assessed           Lower Extremity Assessment: RLE deficits/detail RLE Deficits / Details: 3/5 quads with AAROM at knee -10 - 90    Cervical / Trunk Assessment: Normal  Communication   Communication: No difficulties  Cognition Arousal/Alertness: Awake/alert Behavior During Therapy: WFL for tasks assessed/performed Overall Cognitive Status: Within Functional Limits for tasks assessed                      General Comments      Exercises Total Joint Exercises Ankle Circles/Pumps: AROM;10 reps;Supine;Both Quad Sets: AROM;Both;Supine;10 reps Heel Slides: AAROM;15 reps;Supine;Right Straight Leg Raises: Supine;15 reps;AAROM;AROM;Right      Assessment/Plan    PT Assessment Patient needs continued PT services  PT Diagnosis Difficulty walking   PT Problem List Decreased strength;Decreased range  of motion;Decreased activity tolerance;Decreased mobility;Decreased knowledge of use of DME;Pain  PT Treatment Interventions DME instruction;Gait training;Stair training;Functional mobility training;Therapeutic activities;Therapeutic exercise;Patient/family education   PT Goals (Current goals can be found in the Care Plan section) Acute Rehab PT Goals Patient Stated Goal: Resume previous lifestyle with decreased pain PT Goal Formulation: With patient Time For Goal  Achievement: 05/18/14 Potential to Achieve Goals: Good    Frequency 7X/week   Barriers to discharge        Co-evaluation               End of Session Equipment Utilized During Treatment: Gait belt Activity Tolerance: Patient tolerated treatment well Patient left: in chair;with call bell/phone within reach;with family/visitor present Nurse Communication: Mobility status         Time: 7408-1448 PT Time Calculation (min) (ACUTE ONLY): 34 min   Charges:   PT Evaluation $Initial PT Evaluation Tier I: 1 Procedure PT Treatments $Gait Training: 8-22 mins $Therapeutic Exercise: 8-22 mins   PT G Codes:          Louetta Hollingshead 05/11/2014, 12:30 PM

## 2014-05-12 ENCOUNTER — Encounter (HOSPITAL_COMMUNITY): Payer: Self-pay | Admitting: Orthopedic Surgery

## 2014-05-17 NOTE — Discharge Summary (Signed)
Physician Discharge Summary  Patient ID: NEHA WAIGHT MRN: [id number] DOB/AGE: 1951/04/08 63 y.o.  Admit date: 05/10/2014 Discharge date: 05/11/2014   Procedures:  Procedure(s) (LRB): RIGHT TOTAL KNEE ARTHROPLASTY (Right)  Attending Physician:  Dr. Paralee Cancel   Admission Diagnoses:   Right knee primary OA / pain  Discharge Diagnoses:  Principal Problem:   S/P right TKA Active Problems:   S/P knee replacement  Past Medical History  Diagnosis Date  . GERD (gastroesophageal reflux disease)   . Anxiety   . Hyperlipidemia   . Hypertension   . Hx of colonic polyps   . Kidney stones   . Ventral hernia   . Chicken pox as a child  . Measles as a child  . Mumps as a child  . Obesity   . Vasomotor rhinitis 04/05/2012  . Sleep apnea 04/05/2012  . Anemia 04/05/2012  . Elevated LFTs 04/05/2012  . OA (osteoarthritis) of knee 04/05/2012  . Tinea corporis 02/23/2013  . Preventative health care 09/05/2013  . Chronic diastolic CHF (congestive heart failure), NYHA class 1   . Complication of anesthesia     pt states feels different in her body after anesthesia when waking up and also experiences a smell of burnt plastic for approx a wk   . PONV (postoperative nausea and vomiting)   . Dysrhythmia     occas palpitations  . Heart murmur     hx of one at birth   . Shortness of breath dyspnea     walking distances / climbing stairs    HPI: [name], 63 y.o. female, has a history of pain and functional disability in the right knee due to arthritis and has failed non-surgical conservative treatments for greater than 12 weeks to includeNSAID's and/or analgesics, corticosteriod injections, viscosupplementation injections, use of assistive devices and activity modification. Onset of symptoms was gradual, starting 5+ years ago with gradually worsening course since that time. The patient noted prior procedures on the knee to include arthroscopy and menisectomy on the right  knee(s). Patient currently rates pain in the right knee(s) at 10 out of 10 with activity. Patient has night pain, worsening of pain with activity and weight bearing, pain that interferes with activities of daily living, pain with passive range of motion, crepitus and joint swelling. Patient has evidence of periarticular osteophytes and joint space narrowing by imaging studies. There is no active infection. Risks, benefits and expectations were discussed with the patient. Risks including but not limited to the risk of anesthesia, blood clots, nerve damage, blood vessel damage, failure of the prosthesis, infection and up to and including death. Patient understand the risks, benefits and expectations and wishes to proceed with surgery.  PCP: Penni Homans, MD   Discharged Condition: good  Hospital Course:  Patient underwent the above stated procedure on 05/10/2014. Patient tolerated the procedure well and brought to the recovery room in good condition and subsequently to the floor.  POD #1 BP: 134/68 ; Pulse: 56 ; Temp: 97.6 F (36.4 C) ; Resp: 20 Patient reports pain as mild, pain controlled. No events throughout. Ready to be discharged home. Dorsiflexion/plantar flexion intact, incision: dressing C/D/I, no cellulitis present and compartment soft.   LABS  Basename    HGB  12.1  HCT  38.6    Discharge Exam: General appearance: alert, cooperative and no distress Extremities: Homans sign is negative, no sign of DVT, no edema, redness or tenderness in the calves or thighs and no ulcers, gangrene or  trophic changes  Disposition: Home with follow up in 2 weeks   Follow-up Information    Follow up with [facility].   Why:  3n1 (commode) and rolling walker   Contact information:   [address] [place] [address] [phone]       Follow up with Mauri Pole, MD. Schedule an appointment as soon as possible for a visit in 2 weeks.   Specialty:  Orthopedic  Surgery   Contact information:   [address] [place] [address] [phone]       Follow up with [facility].   Why:  home health physical therapy   Contact information:   [address] [place] [address] [phone]       Discharge Instructions    Call MD / Call 911    Complete by:  As directed   If you experience chest pain or shortness of breath, CALL 911 and be transported to the hospital emergency room.  If you develope a fever above 101 F, pus (white drainage) or increased drainage or redness at the wound, or calf pain, call your surgeon's office.     Change dressing    Complete by:  As directed   Maintain surgical dressing for 10-14 days, or until follow up in the clinic.     Constipation Prevention    Complete by:  As directed   Drink plenty of fluids.  Prune juice may be helpful.  You may use a stool softener, such as Colace (over the counter) 100 mg twice a day.  Use MiraLax (over the counter) for constipation as needed.     Diet - low sodium heart healthy    Complete by:  As directed      Discharge instructions    Complete by:  As directed   Maintain surgical dressing for 10-14 days, or until follow up in the clinic. Follow up in 2 weeks at [facility]. Call with any questions or concerns.     Driving restrictions    Complete by:  As directed   No driving for 4 weeks     Increase activity slowly as tolerated    Complete by:  As directed      TED hose    Complete by:  As directed   Use stockings (TED hose) for 2 weeks on both leg(s).  You may remove them at night for sleeping.     Weight bearing as tolerated    Complete by:  As directed   Laterality:  right  Extremity:  Lower             Medication List    STOP taking these medications        aspirin 81 MG tablet  Replaced by:  aspirin 325 MG EC tablet     diclofenac 75 MG EC tablet  Commonly known as:  VOLTAREN      TAKE these medications         albuterol 108 (90 BASE) MCG/ACT inhaler  Commonly known as:  PROVENTIL HFA;VENTOLIN HFA  Inhale 2 puffs into the lungs every 6 (six) hours as needed for wheezing or shortness of breath.     aspirin 325 MG EC tablet  Take 1 tablet (325 mg total) by mouth 2 (two) times daily.     beclomethasone 80 MCG/ACT inhaler  Commonly known as:  QVAR  Inhale 1 puff into the lungs 2 (two) times daily.  cetirizine 10 MG tablet  Commonly known as:  ZYRTEC  Take 1 tablet (10 mg total) by mouth daily.     clotrimazole-betamethasone cream  Commonly known as:  LOTRISONE  Apply 1 application topically 2 (two) times daily as needed (for rash).     diltiazem 120 MG 24 hr capsule  Commonly known as:  CARDIZEM CD  Take 1 capsule (120 mg total) by mouth daily.     DSS 100 MG Caps  Take 100 mg by mouth 2 (two) times daily.     escitalopram 20 MG tablet  Commonly known as:  LEXAPRO  TAKE 1 TABLET BY MOUTH DAILY.     ferrous sulfate 325 (65 FE) MG tablet  Take 1 tablet (325 mg total) by mouth 3 (three) times daily after meals.     fluticasone 50 MCG/ACT nasal spray  Commonly known as:  FLONASE  Place 1 spray into both nostrils daily as needed for allergies.     furosemide 40 MG tablet  Commonly known as:  LASIX  Take 1 tablet (40 mg total) by mouth daily.     GLUCOSAMINE PO  Take 1 tablet by mouth at bedtime.     HYDROcodone-acetaminophen 7.5-325 MG per tablet  Commonly known as:  NORCO  Take 1-2 tablets by mouth every 4 (four) hours as needed for moderate pain.     losartan 100 MG tablet  Commonly known as:  COZAAR  Take 1 tablet (100 mg total) by mouth daily.     methocarbamol 500 MG tablet  Commonly known as:  ROBAXIN  Take 1 tablet (500 mg total) by mouth every 6 (six) hours as needed for muscle spasms.     nebivolol 10 MG tablet  Commonly known as:  BYSTOLIC  Take 10 mg by mouth at bedtime.     omega-3 acid ethyl esters 1 G capsule  Commonly known as:  LOVAZA  Take 1  g by mouth at bedtime.     pantoprazole 40 MG tablet  Commonly known as:  PROTONIX  Take 1 tablet (40 mg total) by mouth 2 (two) times daily.     polyethylene glycol packet  Commonly known as:  MIRALAX / GLYCOLAX  Take 17 g by mouth 2 (two) times daily.     rosuvastatin 20 MG tablet  Commonly known as:  CRESTOR  Take 1 tablet (20 mg total) by mouth daily.     SYSTANE OP  Apply 1 drop to eye 2 (two) times daily as needed (for dry eyes).         Signed: West Pugh. Hasset Chaviano   PA-C  05/17/2014, 3:08 PM

## 2014-06-02 ENCOUNTER — Encounter (HOSPITAL_COMMUNITY): Payer: Self-pay | Admitting: Cardiovascular Disease

## 2014-06-08 ENCOUNTER — Ambulatory Visit: Payer: 59 | Attending: Orthopedic Surgery | Admitting: Physical Therapy

## 2014-06-08 DIAGNOSIS — M25661 Stiffness of right knee, not elsewhere classified: Secondary | ICD-10-CM | POA: Insufficient documentation

## 2014-06-08 DIAGNOSIS — M25561 Pain in right knee: Secondary | ICD-10-CM | POA: Insufficient documentation

## 2014-06-10 ENCOUNTER — Ambulatory Visit: Payer: 59 | Admitting: *Deleted

## 2014-06-10 DIAGNOSIS — M25561 Pain in right knee: Secondary | ICD-10-CM | POA: Diagnosis not present

## 2014-06-13 ENCOUNTER — Ambulatory Visit: Payer: 59 | Admitting: Physical Therapy

## 2014-06-13 DIAGNOSIS — M25561 Pain in right knee: Secondary | ICD-10-CM | POA: Diagnosis not present

## 2014-06-21 ENCOUNTER — Ambulatory Visit: Payer: 59 | Admitting: Physical Therapy

## 2014-06-21 DIAGNOSIS — M25561 Pain in right knee: Secondary | ICD-10-CM | POA: Diagnosis not present

## 2014-06-23 ENCOUNTER — Ambulatory Visit: Payer: 59 | Admitting: Physical Therapy

## 2014-06-23 DIAGNOSIS — M25561 Pain in right knee: Secondary | ICD-10-CM | POA: Diagnosis not present

## 2014-06-28 ENCOUNTER — Ambulatory Visit: Payer: 59 | Attending: Orthopedic Surgery | Admitting: Physical Therapy

## 2014-06-28 DIAGNOSIS — M25661 Stiffness of right knee, not elsewhere classified: Secondary | ICD-10-CM | POA: Diagnosis not present

## 2014-06-28 DIAGNOSIS — M25561 Pain in right knee: Secondary | ICD-10-CM | POA: Diagnosis present

## 2014-06-30 ENCOUNTER — Ambulatory Visit: Payer: 59 | Admitting: Physical Therapy

## 2014-06-30 DIAGNOSIS — M25561 Pain in right knee: Secondary | ICD-10-CM | POA: Diagnosis not present

## 2014-07-04 ENCOUNTER — Encounter: Payer: Self-pay | Admitting: Cardiology

## 2014-07-04 ENCOUNTER — Other Ambulatory Visit: Payer: 59

## 2014-07-11 ENCOUNTER — Ambulatory Visit (INDEPENDENT_AMBULATORY_CARE_PROVIDER_SITE_OTHER): Payer: 59 | Admitting: Family Medicine

## 2014-07-11 ENCOUNTER — Encounter: Payer: Self-pay | Admitting: Family Medicine

## 2014-07-11 VITALS — BP 136/80 | HR 69 | Temp 98.6°F | Ht 66.0 in | Wt 321.2 lb

## 2014-07-11 DIAGNOSIS — E669 Obesity, unspecified: Secondary | ICD-10-CM | POA: Insufficient documentation

## 2014-07-11 DIAGNOSIS — K219 Gastro-esophageal reflux disease without esophagitis: Secondary | ICD-10-CM

## 2014-07-11 DIAGNOSIS — I471 Supraventricular tachycardia: Secondary | ICD-10-CM

## 2014-07-11 DIAGNOSIS — M179 Osteoarthritis of knee, unspecified: Secondary | ICD-10-CM

## 2014-07-11 DIAGNOSIS — E785 Hyperlipidemia, unspecified: Secondary | ICD-10-CM

## 2014-07-11 DIAGNOSIS — I2510 Atherosclerotic heart disease of native coronary artery without angina pectoris: Secondary | ICD-10-CM

## 2014-07-11 DIAGNOSIS — G4733 Obstructive sleep apnea (adult) (pediatric): Secondary | ICD-10-CM

## 2014-07-11 DIAGNOSIS — I10 Essential (primary) hypertension: Secondary | ICD-10-CM

## 2014-07-11 DIAGNOSIS — M1711 Unilateral primary osteoarthritis, right knee: Secondary | ICD-10-CM

## 2014-07-11 DIAGNOSIS — R739 Hyperglycemia, unspecified: Secondary | ICD-10-CM

## 2014-07-11 HISTORY — DX: Obesity, unspecified: E66.9

## 2014-07-11 LAB — CBC
HCT: 40.5 % (ref 36.0–46.0)
Hemoglobin: 12.8 g/dL (ref 12.0–15.0)
MCHC: 31.5 g/dL (ref 30.0–36.0)
MCV: 88 fl (ref 78.0–100.0)
PLATELETS: 289 10*3/uL (ref 150.0–400.0)
RBC: 4.6 Mil/uL (ref 3.87–5.11)
RDW: 15.1 % (ref 11.5–15.5)
WBC: 7.3 10*3/uL (ref 4.0–10.5)

## 2014-07-11 LAB — COMPREHENSIVE METABOLIC PANEL
ALBUMIN: 4 g/dL (ref 3.5–5.2)
ALT: 28 U/L (ref 0–35)
AST: 28 U/L (ref 0–37)
Alkaline Phosphatase: 98 U/L (ref 39–117)
BILIRUBIN TOTAL: 0.4 mg/dL (ref 0.2–1.2)
BUN: 17 mg/dL (ref 6–23)
CALCIUM: 9.8 mg/dL (ref 8.4–10.5)
CO2: 28 mEq/L (ref 19–32)
CREATININE: 0.78 mg/dL (ref 0.40–1.20)
Chloride: 103 mEq/L (ref 96–112)
GFR: 79.12 mL/min (ref >60.00)
GLUCOSE: 124 mg/dL — AB (ref 70–99)
Potassium: 4.6 mEq/L (ref 3.5–5.1)
Sodium: 138 mEq/L (ref 135–145)
Total Protein: 7.4 g/dL (ref 6.0–8.3)

## 2014-07-11 LAB — LIPID PANEL
Cholesterol: 163 mg/dL (ref 0–200)
HDL: 39 mg/dL — ABNORMAL LOW (ref >39.00)
LDL Cholesterol: 88 mg/dL (ref 0–99)
NonHDL: 124
Total CHOL/HDL Ratio: 4
Triglycerides: 182 mg/dL — ABNORMAL HIGH (ref 0.0–149.0)
VLDL: 36.4 mg/dL (ref 0.0–40.0)

## 2014-07-11 LAB — TSH: TSH: 1.34 u[IU]/mL (ref 0.35–4.50)

## 2014-07-11 LAB — HEMOGLOBIN A1C: HEMOGLOBIN A1C: 6.4 % (ref 4.6–6.5)

## 2014-07-11 MED ORDER — FLUTICASONE PROPIONATE 50 MCG/ACT NA SUSP: 1.0000 | Freq: Every day | NASAL | Status: DC | PRN

## 2014-07-11 MED ORDER — CLOTRIMAZOLE-BETAMETHASONE 1-0.05 % EX CREA: 1.0000 "application " | TOPICAL_CREAM | Freq: Two times a day (BID) | CUTANEOUS | Status: DC | PRN

## 2014-07-11 NOTE — Progress Notes (Signed)
[name]  [id number] 1950/09/17 07/11/2014      Progress Note-Follow Up  Subjective  Chief Complaint  Chief Complaint  Patient presents with  . Follow-up    3 mos    HPI  Patient is a 64 y.o. female in today for routine medical care. Patient is doing well. Had TKR in November and has healed well. No recent illness. No acute concerns. Denies CP/palp/SOB/HA/congestion/fevers/GI or GU c/o. Taking meds as prescribed  Past Medical History  Diagnosis Date  . GERD (gastroesophageal reflux disease)   . Anxiety   . Hyperlipidemia   . Hypertension   . Hx of colonic polyps   . Kidney stones   . Ventral hernia   . Chicken pox as a child  . Measles as a child  . Mumps as a child  . Obesity   . Vasomotor rhinitis 04/05/2012  . Sleep apnea 04/05/2012  . Anemia 04/05/2012  . Elevated LFTs 04/05/2012  . OA (osteoarthritis) of knee 04/05/2012  . Tinea corporis 02/23/2013  . Preventative health care 09/05/2013  . Chronic diastolic CHF (congestive heart failure), NYHA class 1   . Complication of anesthesia     pt states feels different in her body after anesthesia when waking up and also experiences a smell of burnt plastic for approx a wk   . PONV (postoperative nausea and vomiting)   . Dysrhythmia     occas palpitations  . Heart murmur     hx of one at birth   . Shortness of breath dyspnea     walking distances / climbing stairs    Past Surgical History  Procedure Laterality Date  . Cholecystectomy    . Meniscus repair  2009  . Total knee arthroplasty  2011    left  . Knee arthroscopy  dec 2011    right  . Mouth surgery      teeth implants  . Hernia repair  02/08/11    ventral hernia  . Cesarean section      X 3  . Wisdom tooth extraction  2000  . Cardiac catheterization      normal coroary arteries per patient  . Tubal ligation    . Cardiovascular stress test      10/12/2013  . Total knee arthroplasty Right 05/10/2014    Procedure: RIGHT TOTAL KNEE  ARTHROPLASTY;  Surgeon: Mauri Pole, MD;  Location: [facility];  Service: Orthopedics;  Laterality: Right;  . Left and right heart catheterization with coronary angiogram N/A 11/08/2013    Procedure: LEFT AND RIGHT HEART CATHETERIZATION WITH CORONARY ANGIOGRAM;  Surgeon: [name], MD;  Location: [facility] CATH LAB;  Service: Cardiovascular;  Laterality: N/A;    Family History  Problem Relation Age of Onset  . Thyroid disease Mother   . Hyperlipidemia Mother   . Heart attack Father 62  . Hypertension Father   . Arthritis Father     RA  . Coronary artery disease Father   . Coronary artery disease Brother   . Heart disease Brother   . Cancer Maternal Aunt     colon  . Cancer Maternal Grandmother     colon  . Heart attack Maternal Grandfather   . Alcohol abuse Paternal Grandfather     History   Social History  . Marital Status: Divorced    Spouse Name: N/A    Number of Children: N/A  . Years of Education: N/A   Occupational History  . Not on file.  Social History Main Topics  . Smoking status: Never Smoker   . Smokeless tobacco: Never Used  . Alcohol Use: No  . Drug Use: No  . Sexual Activity: No     Comment: lives with mother currently-stress   Other Topics Concern  . Not on file   Social History Narrative    Current Outpatient Prescriptions on File Prior to Visit  Medication Sig Dispense Refill  . albuterol (PROVENTIL HFA;VENTOLIN HFA) 108 (90 BASE) MCG/ACT inhaler Inhale 2 puffs into the lungs every 6 (six) hours as needed for wheezing or shortness of breath. 1 Inhaler 0  . beclomethasone (QVAR) 80 MCG/ACT inhaler Inhale 1 puff into the lungs 2 (two) times daily. (Patient taking differently: Inhale 1 puff into the lungs 2 (two) times daily as needed (shortness of breath.). ) 1 Inhaler 0  . cetirizine (ZYRTEC) 10 MG tablet Take 1 tablet (10 mg total) by mouth daily. 90 tablet 3  . clotrimazole-betamethasone (LOTRISONE) cream Apply 1 application topically 2  (two) times daily as needed (for rash).    Marland Kitchen diltiazem (CARDIZEM CD) 120 MG 24 hr capsule Take 1 capsule (120 mg total) by mouth daily. 90 capsule 3  . escitalopram (LEXAPRO) 20 MG tablet TAKE 1 TABLET BY MOUTH DAILY. 90 tablet 0  . ferrous sulfate 325 (65 FE) MG tablet Take 1 tablet (325 mg total) by mouth 3 (three) times daily after meals.  3  . fluticasone (FLONASE) 50 MCG/ACT nasal spray Place 1 spray into both nostrils daily as needed for allergies. 16 g 1  . furosemide (LASIX) 40 MG tablet Take 1 tablet (40 mg total) by mouth daily. 90 tablet 3  . Glucosamine HCl (GLUCOSAMINE PO) Take 1 tablet by mouth at bedtime.     Marland Kitchen losartan (COZAAR) 100 MG tablet Take 1 tablet (100 mg total) by mouth daily. 90 tablet 3  . nebivolol (BYSTOLIC) 10 MG tablet Take 10 mg by mouth at bedtime.    Marland Kitchen omega-3 acid ethyl esters (LOVAZA) 1 G capsule Take 1 g by mouth at bedtime.     . pantoprazole (PROTONIX) 40 MG tablet Take 1 tablet (40 mg total) by mouth 2 (two) times daily. 180 tablet 3  . Polyethyl Glycol-Propyl Glycol (SYSTANE OP) Apply 1 drop to eye 2 (two) times daily as needed (for dry eyes).    . rosuvastatin (CRESTOR) 20 MG tablet Take 1 tablet (20 mg total) by mouth daily. (Patient taking differently: Take 20 mg by mouth at bedtime. ) 90 tablet 1   No current facility-administered medications on file prior to visit.    No Known Allergies  Review of Systems  Review of Systems  Constitutional: Negative for fever, chills and malaise/fatigue.  HENT: Positive for congestion. Negative for sore throat.   Eyes: Negative for discharge.  Respiratory: Negative for shortness of breath.   Cardiovascular: Negative for chest pain, palpitations and leg swelling.  Gastrointestinal: Negative for nausea, abdominal pain and diarrhea.  Genitourinary: Negative for dysuria.  Musculoskeletal: Positive for joint pain. Negative for falls.       Right knee TKR on 05/10/14 doing well  Skin: Negative for rash.    Neurological: Negative for loss of consciousness and headaches.  Endo/Heme/Allergies: Negative for polydipsia.  Psychiatric/Behavioral: Negative for depression and suicidal ideas. The patient is not nervous/anxious and does not have insomnia.     Objective  BP 171/101 mmHg  Pulse 69  Temp(Src) 98.6 F (37 C) (Oral)  Ht 5\' 6"  (1.676 m)  Wt  321 lb 3.2 oz (145.695 kg)  BMI 51.87 kg/m2  SpO2 94%  Physical Exam  Physical Exam  Constitutional: She is oriented to person, place, and time and well-developed, well-nourished, and in no distress. No distress.  HENT:  Head: Normocephalic and atraumatic.  TMs dull and retracted  Eyes: Conjunctivae are normal.  Neck: Neck supple. No thyromegaly present.  Cardiovascular: Normal rate, regular rhythm and normal heart sounds.   No murmur heard. Pulmonary/Chest: Effort normal and breath sounds normal. She has no wheezes.  Abdominal: Soft. Bowel sounds are normal. She exhibits no distension and no mass.  Musculoskeletal: She exhibits no edema.   Right knee well healed scar vertically. No erythema  Lymphadenopathy:    She has no cervical adenopathy.  Neurological: She is alert and oriented to person, place, and time.  Skin: Skin is warm and dry. No rash noted. She is not diaphoretic.  Psychiatric: Memory, affect and judgment normal.    Lab Results  Component Value Date   TSH 0.557 12/31/2013   Lab Results  Component Value Date   WBC 12.5* 05/11/2014   HGB 12.1 05/11/2014   HCT 38.6 05/11/2014   MCV 90.8 05/11/2014   PLT 245 05/11/2014   Lab Results  Component Value Date   CREATININE 0.71 05/11/2014   BUN 11 05/11/2014   NA 139 05/11/2014   K 4.7 05/11/2014   CL 100 05/11/2014   CO2 27 05/11/2014   Lab Results  Component Value Date   ALT 29 03/24/2014   AST 25 12/31/2013   ALKPHOS 80 12/31/2013   BILITOT 0.5 12/31/2013   Lab Results  Component Value Date   CHOL 164 03/24/2014   Lab Results  Component Value Date    HDL 31.80* 03/24/2014   Lab Results  Component Value Date   LDLCALC 79 12/31/2013   Lab Results  Component Value Date   TRIG 240.0* 03/24/2014   Lab Results  Component Value Date   CHOLHDL 5 03/24/2014     Assessment & Plan   Atrial tachycardia, paroxysmal RRR today   Essential hypertension Well controlled, no changes to meds. Encouraged heart healthy diet such as the DASH diet and exercise as tolerated.    Obstructive sleep apnea Uses CPAP regularly   GERD Avoid offending foods, start probiotics. Do not eat large meals in late evening and consider raising head of bed.    Hyperlipidemia Tolerating Crestor. Tolerating statin, encouraged heart healthy diet, avoid trans fats, minimize simple carbs and saturated fats. Increase exercise as tolerated   Hyperglycemia Check an A1C   Obesity Encouraged DASH diet, decrease po intake and increase exercise as tolerated. Needs 7-8 hours of sleep nightly. Avoid trans fats, eat small, frequent meals every 4-5 hours with lean proteins, complex carbs and healthy fats. Minimize simple carbs, GMO foods.   CAD (coronary artery disease), native coronary artery Doing well a this time   OA (osteoarthritis) of knee Doing well s/p TKR

## 2014-07-11 NOTE — Progress Notes (Signed)
Pre visit review using our clinic review tool, if applicable. No additional management support is needed unless otherwise documented below in the visit note. 

## 2014-07-11 NOTE — Assessment & Plan Note (Signed)
Well controlled, no changes to meds. Encouraged heart healthy diet such as the DASH diet and exercise as tolerated.  °

## 2014-07-11 NOTE — Assessment & Plan Note (Signed)
Check an A1C

## 2014-07-11 NOTE — Assessment & Plan Note (Signed)
Encouraged DASH diet, decrease po intake and increase exercise as tolerated. Needs 7-8 hours of sleep nightly. Avoid trans fats, eat small, frequent meals every 4-5 hours with lean proteins, complex carbs and healthy fats. Minimize simple carbs, GMO foods. 

## 2014-07-11 NOTE — Assessment & Plan Note (Signed)
Uses CPAP regularly °

## 2014-07-11 NOTE — Assessment & Plan Note (Signed)
Tolerating Crestor. Tolerating statin, encouraged heart healthy diet, avoid trans fats, minimize simple carbs and saturated fats. Increase exercise as tolerated

## 2014-07-11 NOTE — Assessment & Plan Note (Signed)
RRR today 

## 2014-07-11 NOTE — Patient Instructions (Signed)
Minimize sodium increase sleep to 6-8 hours  Hypertension Hypertension, commonly called high blood pressure, is when the force of blood pumping through your arteries is too strong. Your arteries are the blood vessels that carry blood from your heart throughout your body. A blood pressure reading consists of a higher number over a lower number, such as 110/72. The higher number (systolic) is the pressure inside your arteries when your heart pumps. The lower number (diastolic) is the pressure inside your arteries when your heart relaxes. Ideally you want your blood pressure below 120/80. Hypertension forces your heart to work harder to pump blood. Your arteries may become narrow or stiff. Having hypertension puts you at risk for heart disease, stroke, and other problems.  RISK FACTORS Some risk factors for high blood pressure are controllable. Others are not.  Risk factors you cannot control include:   Race. You may be at higher risk if you are African American.  Age. Risk increases with age.  Gender. Men are at higher risk than women before age 31 years. After age 43, women are at higher risk than men. Risk factors you can control include:  Not getting enough exercise or physical activity.  Being overweight.  Getting too much fat, sugar, calories, or salt in your diet.  Drinking too much alcohol. SIGNS AND SYMPTOMS Hypertension does not usually cause signs or symptoms. Extremely high blood pressure (hypertensive crisis) may cause headache, anxiety, shortness of breath, and nosebleed. DIAGNOSIS  To check if you have hypertension, your health care provider will measure your blood pressure while you are seated, with your arm held at the level of your heart. It should be measured at least twice using the same arm. Certain conditions can cause a difference in blood pressure between your right and left arms. A blood pressure reading that is higher than normal on one occasion does not mean that  you need treatment. If one blood pressure reading is high, ask your health care provider about having it checked again. TREATMENT  Treating high blood pressure includes making lifestyle changes and possibly taking medicine. Living a healthy lifestyle can help lower high blood pressure. You may need to change some of your habits. Lifestyle changes may include:  Following the DASH diet. This diet is high in fruits, vegetables, and whole grains. It is low in salt, red meat, and added sugars.  Getting at least 2 hours of brisk physical activity every week.  Losing weight if necessary.  Not smoking.  Limiting alcoholic beverages.  Learning ways to reduce stress. If lifestyle changes are not enough to get your blood pressure under control, your health care provider may prescribe medicine. You may need to take more than one. Work closely with your health care provider to understand the risks and benefits. HOME CARE INSTRUCTIONS  Have your blood pressure rechecked as directed by your health care provider.   Take medicines only as directed by your health care provider. Follow the directions carefully. Blood pressure medicines must be taken as prescribed. The medicine does not work as well when you skip doses. Skipping doses also puts you at risk for problems.   Do not smoke.   Monitor your blood pressure at home as directed by your health care provider. SEEK MEDICAL CARE IF:   You think you are having a reaction to medicines taken.  You have recurrent headaches or feel dizzy.  You have swelling in your ankles.  You have trouble with your vision. SEEK IMMEDIATE MEDICAL CARE IF:  You develop a severe headache or confusion.  You have unusual weakness, numbness, or feel faint.  You have severe chest or abdominal pain.  You vomit repeatedly.  You have trouble breathing. MAKE SURE YOU:   Understand these instructions.  Will watch your condition.  Will get help right away if  you are not doing well or get worse. Document Released: 06/10/2005 Document Revised: 10/25/2013 Document Reviewed: 04/02/2013 [facility] Patient Information 2015 [place], [place]. This information is not intended to replace advice given to you by your health care provider. Make sure you discuss any questions you have with your health care provider.

## 2014-07-11 NOTE — Assessment & Plan Note (Signed)
Avoid offending foods, start probiotics. Do not eat large meals in late evening and consider raising head of bed.  

## 2014-07-17 NOTE — Assessment & Plan Note (Signed)
Doing well s/p TKR

## 2014-07-17 NOTE — Assessment & Plan Note (Signed)
Doing well a this time

## 2014-08-08 ENCOUNTER — Other Ambulatory Visit: Payer: Self-pay | Admitting: Family Medicine

## 2014-09-13 NOTE — Progress Notes (Signed)
Cardiology Office Note   Date:  09/14/2014   ID:  [name], DOB May 27, 1951, MRN [id number]  PCP:  Penni Homans, MD  Cardiologist:   Sueanne Margarita, MD   Chief Complaint  Patient presents with  . Coronary Artery Disease  . Hypertension      History of Present Illness: [name] is a 64 y.o. female with a history of HTN, chronic diastolic CHF and dyslipidemia who presents today for followup. She saw me recently with complaints of chest pressure and SOB when at work as well as palpitations. Both her dad and brother have a history of CAD. She has been under a lot of stress. Her Dad was killed and she had to moved in with her mom which has caused her a lot of stress. She underwent recent nuclear stress test which showed small defect in very distal anteroseptal and apical wall that improves at rest. Otherwise normal perfusion and felt to possibly represent some shifting soft tissue (breast) but could not exclude trivial ischemia. Heart monitor showed nonsustained atrial tachycardia. She continued to have SOB and underwent cardiac cath which revealed nonobstructive ASCAD with 20% mid LAD, 20% prox left circ and 20% ostial RCA with elevated LVEDP. She was started on Lasix and presents back today for followup. She says that she has noticed a big difference after going on Lasix. Since I saw her last she had knee surgery and had some problems with SOB after that but it has resolved.  She denies any chest pain, SOB, DOE, palpitations or syncope. Rarely she will have some LE edema when it gets hot out.  She occasionally has some dizziness in the am when going from sitting to standing.       Past Medical History  Diagnosis Date  . GERD (gastroesophageal reflux disease)   . Anxiety   . Hyperlipidemia   . Hypertension   . Hx of colonic polyps   . Kidney stones   . Ventral hernia   . Chicken pox as a child  . Measles as a child  . Mumps as a child  . Obesity   . Vasomotor  rhinitis 04/05/2012  . Sleep apnea 04/05/2012  . Anemia 04/05/2012  . Elevated LFTs 04/05/2012  . OA (osteoarthritis) of knee 04/05/2012  . Tinea corporis 02/23/2013  . Preventative health care 09/05/2013  . Chronic diastolic CHF (congestive heart failure), NYHA class 1   . Complication of anesthesia     pt states feels different in her body after anesthesia when waking up and also experiences a smell of burnt plastic for approx a wk   . PONV (postoperative nausea and vomiting)   . Dysrhythmia     occas palpitations  . Heart murmur     hx of one at birth   . Shortness of breath dyspnea     walking distances / climbing stairs  . Obesity 07/11/2014    Past Surgical History  Procedure Laterality Date  . Cholecystectomy    . Meniscus repair  2009  . Total knee arthroplasty  2011    left  . Knee arthroscopy  dec 2011    right  . Mouth surgery      teeth implants  . Hernia repair  02/08/11    ventral hernia  . Cesarean section      X 3  . Wisdom tooth extraction  2000  . Cardiac catheterization      normal coroary arteries per patient  .  Tubal ligation    . Cardiovascular stress test      10/12/2013  . Total knee arthroplasty Right 05/10/2014    Procedure: RIGHT TOTAL KNEE ARTHROPLASTY;  Surgeon: Mauri Pole, MD;  Location: [facility];  Service: Orthopedics;  Laterality: Right;  . Left and right heart catheterization with coronary angiogram N/A 11/08/2013    Procedure: LEFT AND RIGHT HEART CATHETERIZATION WITH CORONARY ANGIOGRAM;  Surgeon: [name], MD;  Location: [facility] CATH LAB;  Service: Cardiovascular;  Laterality: N/A;     Current Outpatient Prescriptions  Medication Sig Dispense Refill  . albuterol (PROVENTIL HFA;VENTOLIN HFA) 108 (90 BASE) MCG/ACT inhaler Inhale 2 puffs into the lungs every 6 (six) hours as needed for wheezing or shortness of breath. 1 Inhaler 0  . beclomethasone (QVAR) 80 MCG/ACT inhaler Inhale 1 puff into the lungs 2 (two) times daily. (Patient  taking differently: Inhale 1 puff into the lungs 2 (two) times daily as needed (shortness of breath.). ) 1 Inhaler 0  . cetirizine (ZYRTEC) 10 MG tablet Take 1 tablet (10 mg total) by mouth daily. 90 tablet 3  . clotrimazole-betamethasone (LOTRISONE) cream Apply 1 application topically 2 (two) times daily as needed (for rash). 30 g 0  . diclofenac (VOLTAREN) 75 MG EC tablet TAKE 1 TABLET BY MOUTH 2 TIMES DAILY 180 tablet 1  . diltiazem (CARDIZEM CD) 120 MG 24 hr capsule Take 1 capsule (120 mg total) by mouth daily. 90 capsule 3  . escitalopram (LEXAPRO) 20 MG tablet TAKE 1 TABLET BY MOUTH DAILY. 90 tablet 3  . fluticasone (FLONASE) 50 MCG/ACT nasal spray Place 1 spray into both nostrils daily as needed for allergies. 16 g 1  . furosemide (LASIX) 40 MG tablet Take 1 tablet (40 mg total) by mouth daily. 90 tablet 3  . Glucosamine HCl (GLUCOSAMINE PO) Take 1 tablet by mouth at bedtime.     Marland Kitchen losartan (COZAAR) 100 MG tablet Take 1 tablet (100 mg total) by mouth daily. 90 tablet 3  . nebivolol (BYSTOLIC) 10 MG tablet Take 10 mg by mouth at bedtime.    Marland Kitchen omega-3 acid ethyl esters (LOVAZA) 1 G capsule Take 1 g by mouth at bedtime.     . pantoprazole (PROTONIX) 40 MG tablet Take 1 tablet (40 mg total) by mouth 2 (two) times daily. 180 tablet 3  . Polyethyl Glycol-Propyl Glycol (SYSTANE OP) Apply 1 drop to eye 2 (two) times daily as needed (for dry eyes).    . rosuvastatin (CRESTOR) 20 MG tablet Take 1 tablet (20 mg total) by mouth daily. (Patient taking differently: Take 20 mg by mouth at bedtime. ) 90 tablet 1   No current facility-administered medications for this visit.    Allergies:   Review of patient's allergies indicates no known allergies.    Social History:  The patient  reports that she has never smoked. She has never used smokeless tobacco. She reports that she does not drink alcohol or use illicit drugs.   Family History:  The patient's family history includes Alcohol abuse in her  paternal grandfather; Arthritis in her father; Cancer in her maternal aunt and maternal grandmother; Coronary artery disease in her brother and father; Heart attack in her maternal grandfather; Heart attack (age of onset: 71) in her father; Heart disease in her brother; Hyperlipidemia in her mother; Hypertension in her father; Thyroid disease in her mother.    ROS:  Please see the history of present illness.   Otherwise, review of systems are positive  for none.   All other systems are reviewed and negative.    PHYSICAL EXAM: VS:  BP 132/80 mmHg  Pulse 58  Ht 5\' 6"  (1.676 m)  Wt 304 lb 9.6 oz (138.166 kg)  BMI 49.19 kg/m2 , BMI Body mass index is 49.19 kg/(m^2). GEN: Well nourished, well developed, in no acute distress HEENT: normal Neck: no JVD, carotid bruits, or masses Cardiac: RRR; no murmurs, rubs, or gallops,no edema  Respiratory:  clear to auscultation bilaterally, normal work of breathing GI: soft, nontender, nondistended, + BS MS: no deformity or atrophy Skin: warm and dry, no rash Neuro:  Strength and sensation are intact Psych: euthymic mood, full affect   EKG:  EKG is not ordered today.    Recent Labs: 11/03/2013: Pro B Natriuretic peptide (BNP) 97.0 07/11/2014: ALT 28; BUN 17; Creatinine 0.78; Hemoglobin 12.8; Platelets 289.0; Potassium 4.6; Sodium 138; TSH 1.34    Lipid Panel    Component Value Date/Time   CHOL 163 07/11/2014 0834   TRIG 182.0* 07/11/2014 0834   HDL 39.00* 07/11/2014 0834   CHOLHDL 4 07/11/2014 0834   VLDL 36.4 07/11/2014 0834   LDLCALC 88 07/11/2014 0834   LDLDIRECT 91.5 03/24/2014 0738      Wt Readings from Last 3 Encounters:  09/14/14 304 lb 9.6 oz (138.166 kg)  07/11/14 321 lb 3.2 oz (145.695 kg)  05/10/14 318 lb (144.244 kg)    ASSESSMENT AND PLAN:   1. Chronic diastolic CHF - symptoms much improved after going on Lasix  - continue Lasix/BB/CCB - decrease Lasix to 20mg  daily since she has some dizziness with standing and has  lost weight.  She is euvolemic on exam.        2.  HTN  controlled today. - continue Bystolic/Cardizem/Cozaar 3. SOB secondary to #1 - resolved  4. Nonsustained atrial tachycardia - controlled on BB   5 Nonobstructive ASCAD - continue ASA/statin - recheck FLP and ALT in 6 months since she is dieting       6.  Obesity - she is on a diet and has lost 17 pounds.  I have encouraged her to increase her exercise.     Current medicines are reviewed at length with the patient today.  The patient does not have concerns regarding medicines.  The following changes have been made:  no change  Labs/ tests ordered today include:None No orders of the defined types were placed in this encounter.     Disposition:   FU with me in 6 months   Signed, Sueanne Margarita, MD  09/14/2014 8:50 AM    [facility] [place], [place], [place]  [address] Phone: [phone]; Fax: [phone]

## 2014-09-14 ENCOUNTER — Ambulatory Visit (INDEPENDENT_AMBULATORY_CARE_PROVIDER_SITE_OTHER): Payer: 59 | Admitting: Cardiology

## 2014-09-14 ENCOUNTER — Encounter: Payer: Self-pay | Admitting: Cardiology

## 2014-09-14 VITALS — BP 132/80 | HR 58 | Ht 66.0 in | Wt 304.6 lb

## 2014-09-14 DIAGNOSIS — I4719 Other supraventricular tachycardia: Secondary | ICD-10-CM

## 2014-09-14 DIAGNOSIS — E785 Hyperlipidemia, unspecified: Secondary | ICD-10-CM

## 2014-09-14 DIAGNOSIS — I5032 Chronic diastolic (congestive) heart failure: Secondary | ICD-10-CM | POA: Diagnosis not present

## 2014-09-14 DIAGNOSIS — I2510 Atherosclerotic heart disease of native coronary artery without angina pectoris: Secondary | ICD-10-CM

## 2014-09-14 DIAGNOSIS — I471 Supraventricular tachycardia: Secondary | ICD-10-CM

## 2014-09-14 DIAGNOSIS — I10 Essential (primary) hypertension: Secondary | ICD-10-CM

## 2014-09-14 MED ORDER — FUROSEMIDE 20 MG PO TABS: 20.0000 mg | ORAL_TABLET | Freq: Every day | ORAL | Status: DC

## 2014-09-14 NOTE — Patient Instructions (Addendum)
Your physician has recommended you make the following change in your medication:  1) DECREASE LASIX to 20 mg daily  Your physician recommends that you return for FASTING lab work in: 6 months.  Your physician wants you to follow-up in: 6 months with Dr. [name]. You will receive a reminder letter in the mail two months in advance. If you don't receive a letter, please call our office to schedule the follow-up appointment.

## 2014-09-27 ENCOUNTER — Ambulatory Visit (INDEPENDENT_AMBULATORY_CARE_PROVIDER_SITE_OTHER): Payer: 59 | Admitting: Family Medicine

## 2014-09-27 ENCOUNTER — Encounter: Payer: Self-pay | Admitting: Family Medicine

## 2014-09-27 ENCOUNTER — Ambulatory Visit (HOSPITAL_BASED_OUTPATIENT_CLINIC_OR_DEPARTMENT_OTHER)
Admission: RE | Admit: 2014-09-27 | Discharge: 2014-09-27 | Disposition: A | Discharge status: Home / Self Care | Payer: 59 | Source: Ambulatory Visit | Attending: Family Medicine | Admitting: Family Medicine

## 2014-09-27 VITALS — BP 134/78 | HR 59 | Temp 98.1°F | Wt 298.1 lb

## 2014-09-27 DIAGNOSIS — M79605 Pain in left leg: Secondary | ICD-10-CM | POA: Diagnosis not present

## 2014-09-27 DIAGNOSIS — M79604 Pain in right leg: Secondary | ICD-10-CM | POA: Diagnosis not present

## 2014-09-27 DIAGNOSIS — M5134 Other intervertebral disc degeneration, thoracic region: Secondary | ICD-10-CM | POA: Diagnosis not present

## 2014-09-27 DIAGNOSIS — E669 Obesity, unspecified: Secondary | ICD-10-CM

## 2014-09-27 DIAGNOSIS — M5489 Other dorsalgia: Secondary | ICD-10-CM

## 2014-09-27 DIAGNOSIS — I10 Essential (primary) hypertension: Secondary | ICD-10-CM

## 2014-09-27 DIAGNOSIS — R739 Hyperglycemia, unspecified: Secondary | ICD-10-CM

## 2014-09-27 DIAGNOSIS — M545 Low back pain: Secondary | ICD-10-CM | POA: Insufficient documentation

## 2014-09-27 DIAGNOSIS — K219 Gastro-esophageal reflux disease without esophagitis: Secondary | ICD-10-CM | POA: Diagnosis not present

## 2014-09-27 MED ORDER — TRAMADOL HCL 50 MG PO TABS: 50.0000 mg | ORAL_TABLET | Freq: Three times a day (TID) | ORAL | Status: DC | PRN

## 2014-09-27 MED ORDER — METHOCARBAMOL 500 MG PO TABS: 500.0000 mg | ORAL_TABLET | Freq: Three times a day (TID) | ORAL | Status: DC | PRN

## 2014-09-27 NOTE — Progress Notes (Signed)
Pre visit review using our clinic review tool, if applicable. No additional management support is needed unless otherwise documented below in the visit note. 

## 2014-09-27 NOTE — Patient Instructions (Signed)
Salon pas patches twice daily, moist heat to backBack Pain, Adult Low back pain is very common. About 1 in 5 people have back pain.The cause of low back pain is rarely dangerous. The pain often gets better over time.About half of people with a sudden onset of back pain feel better in just 2 weeks. About 8 in 10 people feel better by 6 weeks.  CAUSES Some common causes of back pain include:  Strain of the muscles or ligaments supporting the spine.  Wear and tear (degeneration) of the spinal discs.  Arthritis.  Direct injury to the back. DIAGNOSIS Most of the time, the direct cause of low back pain is not known.However, back pain can be treated effectively even when the exact cause of the pain is unknown.Answering your caregiver's questions about your overall health and symptoms is one of the most accurate ways to make sure the cause of your pain is not dangerous. If your caregiver needs more information, he or she may order lab work or imaging tests (X-rays or MRIs).However, even if imaging tests show changes in your back, this usually does not require surgery. HOME CARE INSTRUCTIONS For many people, back pain returns.Since low back pain is rarely dangerous, it is often a condition that people can learn to [facility] their own.   Remain active. It is stressful on the back to sit or stand in one place. Do not sit, drive, or stand in one place for more than 30 minutes at a time. Take short walks on level surfaces as soon as pain allows.Try to increase the length of time you walk each day.  Do not stay in bed.Resting more than 1 or 2 days can delay your recovery.  Do not avoid exercise or work.Your body is made to move.It is not dangerous to be active, even though your back may hurt.Your back will likely heal faster if you return to being active before your pain is gone.  Pay attention to your body when you bend and lift. Many people have less discomfortwhen lifting if they bend their  knees, keep the load close to their bodies,and avoid twisting. Often, the most comfortable positions are those that put less stress on your recovering back.  Find a comfortable position to sleep. Use a firm mattress and lie on your side with your knees slightly bent. If you lie on your back, put a pillow under your knees.  Only take over-the-counter or prescription medicines as directed by your caregiver. Over-the-counter medicines to reduce pain and inflammation are often the most helpful.Your caregiver may prescribe muscle relaxant drugs.These medicines help dull your pain so you can more quickly return to your normal activities and healthy exercise.  Put ice on the injured area.  Put ice in a plastic bag.  Place a towel between your skin and the bag.  Leave the ice on for 15-20 minutes, 03-04 times a day for the first 2 to 3 days. After that, ice and heat may be alternated to reduce pain and spasms.  Ask your caregiver about trying back exercises and gentle massage. This may be of some benefit.  Avoid feeling anxious or stressed.Stress increases muscle tension and can worsen back pain.It is important to recognize when you are anxious or stressed and learn ways to manage it.Exercise is a great option. SEEK MEDICAL CARE IF:  You have pain that is not relieved with rest or medicine.  You have pain that does not improve in 1 week.  You have new symptoms.  You are generally not feeling well. SEEK IMMEDIATE MEDICAL CARE IF:   You have pain that radiates from your back into your legs.  You develop new bowel or bladder control problems.  You have unusual weakness or numbness in your arms or legs.  You develop nausea or vomiting.  You develop abdominal pain.  You feel faint. Document Released: 06/10/2005 Document Revised: 12/10/2011 Document Reviewed: 10/12/2013 [facility] Patient Information 2015 [place], [place]. This information is not intended to replace advice given to you  by your health care provider. Make sure you discuss any questions you have with your health care provider.

## 2014-09-28 LAB — CBC
HEMATOCRIT: 41.2 % (ref 36.0–46.0)
Hemoglobin: 13.5 g/dL (ref 12.0–15.0)
MCHC: 32.9 g/dL (ref 30.0–36.0)
MCV: 83.8 fl (ref 78.0–100.0)
PLATELETS: 310 10*3/uL (ref 150.0–400.0)
RBC: 4.91 Mil/uL (ref 3.87–5.11)
RDW: 15.4 % (ref 11.5–15.5)
WBC: 8.9 10*3/uL (ref 4.0–10.5)

## 2014-09-28 LAB — URINALYSIS, ROUTINE W REFLEX MICROSCOPIC
BILIRUBIN URINE: NEGATIVE
Hgb urine dipstick: NEGATIVE
KETONES UR: NEGATIVE
NITRITE: NEGATIVE
RBC / HPF: NONE SEEN (ref 0–?)
Specific Gravity, Urine: 1.02 (ref 1.000–1.030)
TOTAL PROTEIN, URINE-UPE24: NEGATIVE
Urine Glucose: NEGATIVE
Urobilinogen, UA: 0.2 (ref 0.0–1.0)
pH: 5.5 (ref 5.0–8.0)

## 2014-09-28 LAB — COMPREHENSIVE METABOLIC PANEL
ALBUMIN: 4.1 g/dL (ref 3.5–5.2)
ALT: 26 U/L (ref 0–35)
AST: 25 U/L (ref 0–37)
Alkaline Phosphatase: 92 U/L (ref 39–117)
BUN: 21 mg/dL (ref 6–23)
CALCIUM: 10 mg/dL (ref 8.4–10.5)
CO2: 28 mEq/L (ref 19–32)
Chloride: 103 mEq/L (ref 96–112)
Creatinine, Ser: 0.98 mg/dL (ref 0.40–1.20)
GFR: 60.76 mL/min (ref >60.00)
Glucose, Bld: 95 mg/dL (ref 70–99)
POTASSIUM: 4.8 meq/L (ref 3.5–5.1)
Sodium: 137 mEq/L (ref 135–145)
Total Bilirubin: 0.5 mg/dL (ref 0.2–1.2)
Total Protein: 7.7 g/dL (ref 6.0–8.3)

## 2014-09-28 LAB — URINE CULTURE: Colony Count: 50000

## 2014-10-02 ENCOUNTER — Encounter: Payer: Self-pay | Admitting: Family Medicine

## 2014-10-02 DIAGNOSIS — M549 Dorsalgia, unspecified: Secondary | ICD-10-CM | POA: Insufficient documentation

## 2014-10-02 HISTORY — DX: Dorsalgia, unspecified: M54.9

## 2014-10-02 NOTE — Assessment & Plan Note (Signed)
Discussed need for weight loss likely contributes to back pain. Encouraged DASH diet, decrease po intake and increase exercise as tolerated. Needs 7-8 hours of sleep nightly. Avoid trans fats, eat small, frequent meals every 4-5 hours with lean proteins, complex carbs and healthy fats. Minimize simple carbs, GMO foods.

## 2014-10-02 NOTE — Progress Notes (Signed)
[name]  [id number] 1951-05-19 10/02/2014      Progress Note-Follow Up  Subjective  Chief Complaint  Chief Complaint  Patient presents with  . Back Pain    x1 week, bilateral back pain, Was informed years ago (unsure exact date) that she had Kidney cyst  . Leg Heavines    Started around same time as back pain  . Nausea    Morningtime only, denies fever, chills, vomiting    HPI  Patient is a 64 y.o. female in today for routine medical care. Patient is in today for evaluation of back pain she has had for about the last week. She denies any falls or injury. Pain is more right-sided than left-sided and is mid to low back. No recent fevers or chills. No recent injury or trauma. Takes Diclofenac routinely for chronic pain, no new meds. Denies CP/palp/SOB/HA/congestion/fevers/GI or GU c/o. Taking meds as prescribed  Past Medical History  Diagnosis Date  . GERD (gastroesophageal reflux disease)   . Anxiety   . Hyperlipidemia   . Hypertension   . Hx of colonic polyps   . Kidney stones   . Ventral hernia   . Chicken pox as a child  . Measles as a child  . Mumps as a child  . Obesity   . Vasomotor rhinitis 04/05/2012  . Sleep apnea 04/05/2012  . Anemia 04/05/2012  . Elevated LFTs 04/05/2012  . OA (osteoarthritis) of knee 04/05/2012  . Tinea corporis 02/23/2013  . Preventative health care 09/05/2013  . Chronic diastolic CHF (congestive heart failure), NYHA class 1   . Complication of anesthesia     pt states feels different in her body after anesthesia when waking up and also experiences a smell of burnt plastic for approx a wk   . PONV (postoperative nausea and vomiting)   . Dysrhythmia     occas palpitations  . Heart murmur     hx of one at birth   . Shortness of breath dyspnea     walking distances / climbing stairs  . Obesity 07/11/2014  . Back pain 10/02/2014    Past Surgical History  Procedure Laterality Date  . Cholecystectomy    . Meniscus repair  2009    . Total knee arthroplasty  2011    left  . Knee arthroscopy  dec 2011    right  . Mouth surgery      teeth implants  . Hernia repair  02/08/11    ventral hernia  . Cesarean section      X 3  . Wisdom tooth extraction  2000  . Cardiac catheterization      normal coroary arteries per patient  . Tubal ligation    . Cardiovascular stress test      10/12/2013  . Total knee arthroplasty Right 05/10/2014    Procedure: RIGHT TOTAL KNEE ARTHROPLASTY;  Surgeon: Mauri Pole, MD;  Location: [facility];  Service: Orthopedics;  Laterality: Right;  . Left and right heart catheterization with coronary angiogram N/A 11/08/2013    Procedure: LEFT AND RIGHT HEART CATHETERIZATION WITH CORONARY ANGIOGRAM;  Surgeon: [name], MD;  Location: [facility] CATH LAB;  Service: Cardiovascular;  Laterality: N/A;    Family History  Problem Relation Age of Onset  . Thyroid disease Mother   . Hyperlipidemia Mother   . Heart attack Father 40  . Hypertension Father   . Arthritis Father     RA  . Coronary artery disease Father   .  Coronary artery disease Brother   . Heart disease Brother   . Cancer Maternal Aunt     colon  . Cancer Maternal Grandmother     colon  . Heart attack Maternal Grandfather   . Alcohol abuse Paternal Grandfather     History   Social History  . Marital Status: Divorced    Spouse Name: N/A  . Number of Children: N/A  . Years of Education: N/A   Occupational History  . Not on file.   Social History Main Topics  . Smoking status: Never Smoker   . Smokeless tobacco: Never Used  . Alcohol Use: No  . Drug Use: No  . Sexual Activity: No     Comment: lives with mother currently-stress   Other Topics Concern  . Not on file   Social History Narrative    Current Outpatient Prescriptions on File Prior to Visit  Medication Sig Dispense Refill  . cetirizine (ZYRTEC) 10 MG tablet Take 1 tablet (10 mg total) by mouth daily. 90 tablet 3  . diclofenac (VOLTAREN) 75 MG EC  tablet TAKE 1 TABLET BY MOUTH 2 TIMES DAILY 180 tablet 1  . diltiazem (CARDIZEM CD) 120 MG 24 hr capsule Take 1 capsule (120 mg total) by mouth daily. 90 capsule 3  . escitalopram (LEXAPRO) 20 MG tablet TAKE 1 TABLET BY MOUTH DAILY. 90 tablet 3  . fluticasone (FLONASE) 50 MCG/ACT nasal spray Place 1 spray into both nostrils daily as needed for allergies. 16 g 1  . furosemide (LASIX) 20 MG tablet Take 1 tablet (20 mg total) by mouth daily. 180 tablet 2  . Glucosamine HCl (GLUCOSAMINE PO) Take 1 tablet by mouth at bedtime.     Marland Kitchen losartan (COZAAR) 100 MG tablet Take 1 tablet (100 mg total) by mouth daily. 90 tablet 3  . nebivolol (BYSTOLIC) 10 MG tablet Take 10 mg by mouth at bedtime.    Marland Kitchen omega-3 acid ethyl esters (LOVAZA) 1 G capsule Take 1 g by mouth at bedtime.     . pantoprazole (PROTONIX) 40 MG tablet Take 1 tablet (40 mg total) by mouth 2 (two) times daily. 180 tablet 3  . Polyethyl Glycol-Propyl Glycol (SYSTANE OP) Apply 1 drop to eye 2 (two) times daily as needed (for dry eyes).    . rosuvastatin (CRESTOR) 20 MG tablet Take 1 tablet (20 mg total) by mouth daily. (Patient taking differently: Take 20 mg by mouth at bedtime. ) 90 tablet 1  . albuterol (PROVENTIL HFA;VENTOLIN HFA) 108 (90 BASE) MCG/ACT inhaler Inhale 2 puffs into the lungs every 6 (six) hours as needed for wheezing or shortness of breath. (Patient not taking: Reported on 09/27/2014) 1 Inhaler 0  . beclomethasone (QVAR) 80 MCG/ACT inhaler Inhale 1 puff into the lungs 2 (two) times daily. (Patient not taking: Reported on 09/27/2014) 1 Inhaler 0  . clotrimazole-betamethasone (LOTRISONE) cream Apply 1 application topically 2 (two) times daily as needed (for rash). (Patient not taking: Reported on 09/27/2014) 30 g 0   No current facility-administered medications on file prior to visit.    No Known Allergies  Review of Systems  Review of Systems  Constitutional: Negative for fever and malaise/fatigue.  HENT: Negative for congestion.     Eyes: Negative for discharge.  Respiratory: Negative for shortness of breath.   Cardiovascular: Negative for chest pain, palpitations and leg swelling.  Gastrointestinal: Negative for nausea, abdominal pain and diarrhea.  Genitourinary: Negative for dysuria.  Musculoskeletal: Positive for back pain. Negative for joint pain,  falls and neck pain.  Skin: Negative for rash.  Neurological: Negative for loss of consciousness and headaches.  Endo/Heme/Allergies: Negative for polydipsia.  Psychiatric/Behavioral: Negative for depression and suicidal ideas. The patient is not nervous/anxious and does not have insomnia.     Objective  BP 134/78 mmHg  Pulse 59  Temp(Src) 98.1 F (36.7 C) (Oral)  Wt 298 lb 2 oz (135.229 kg)  SpO2 97%  Physical Exam  Physical Exam  Constitutional: She is oriented to person, place, and time and well-developed, well-nourished, and in no distress. No distress.  HENT:  Head: Normocephalic and atraumatic.  Eyes: Conjunctivae are normal.  Neck: Neck supple. No thyromegaly present.  Cardiovascular: Normal rate, regular rhythm and normal heart sounds.   No murmur heard. Pulmonary/Chest: Effort normal and breath sounds normal. She has no wheezes.  Abdominal: She exhibits no distension and no mass.  Musculoskeletal: She exhibits tenderness. She exhibits no edema.  along paravertebral muscles of right lumbar spine tenderness with palpation  Lymphadenopathy:    She has no cervical adenopathy.  Neurological: She is alert and oriented to person, place, and time.  Skin: Skin is warm and dry. No rash noted. She is not diaphoretic.  Psychiatric: Memory, affect and judgment normal.    Lab Results  Component Value Date   TSH 1.34 07/11/2014   Lab Results  Component Value Date   WBC 8.9 09/27/2014   HGB 13.5 09/27/2014   HCT 41.2 09/27/2014   MCV 83.8 09/27/2014   PLT 310.0 09/27/2014   Lab Results  Component Value Date   CREATININE 0.98 09/27/2014   BUN 21  09/27/2014   NA 137 09/27/2014   K 4.8 09/27/2014   CL 103 09/27/2014   CO2 28 09/27/2014   Lab Results  Component Value Date   ALT 26 09/27/2014   AST 25 09/27/2014   ALKPHOS 92 09/27/2014   BILITOT 0.5 09/27/2014   Lab Results  Component Value Date   CHOL 163 07/11/2014   Lab Results  Component Value Date   HDL 39.00* 07/11/2014   Lab Results  Component Value Date   LDLCALC 88 07/11/2014   Lab Results  Component Value Date   TRIG 182.0* 07/11/2014   Lab Results  Component Value Date   CHOLHDL 4 07/11/2014     Assessment & Plan  Essential hypertension Well controlled, no changes to meds. Encouraged heart healthy diet such as the DASH diet and exercise as tolerated.    GERD Avoid offending foods, start probiotics. Do not eat large meals in late evening and consider raising head of bed. Will need to minimize NSAIDs   Obesity Discussed need for weight loss likely contributes to back pain. Encouraged DASH diet, decrease po intake and increase exercise as tolerated. Needs 7-8 hours of sleep nightly. Avoid trans fats, eat small, frequent meals every 4-5 hours with lean proteins, complex carbs and healthy fats. Minimize simple carbs, GMO foods.   Hyperglycemia hgba1c acceptable, minimize simple carbs. Increase exercise as tolerated. Continue current meds.   Back pain Encouraged moist heat and gentle stretching as tolerated. May try NSAIDs and prescription meds as directed and report if symptoms worsen or seek immediate care. Given Tramadol, muscle relaxer and try Salon pas topically.

## 2014-10-02 NOTE — Assessment & Plan Note (Signed)
Well controlled, no changes to meds. Encouraged heart healthy diet such as the DASH diet and exercise as tolerated.  °

## 2014-10-02 NOTE — Assessment & Plan Note (Signed)
Encouraged moist heat and gentle stretching as tolerated. May try NSAIDs and prescription meds as directed and report if symptoms worsen or seek immediate care. Given Tramadol, muscle relaxer and try Salon pas topically.

## 2014-10-02 NOTE — Assessment & Plan Note (Addendum)
hgba1c acceptable, minimize simple carbs. Increase exercise as tolerated. Continue current meds 

## 2014-10-02 NOTE — Assessment & Plan Note (Signed)
Avoid offending foods, start probiotics. Do not eat large meals in late evening and consider raising head of bed. Will need to minimize NSAIDs

## 2014-10-10 ENCOUNTER — Ambulatory Visit: Payer: 59 | Admitting: Family Medicine

## 2014-10-13 ENCOUNTER — Encounter: Payer: Self-pay | Admitting: Family Medicine

## 2014-10-13 ENCOUNTER — Ambulatory Visit: Payer: 59 | Admitting: Family Medicine

## 2014-10-13 ENCOUNTER — Ambulatory Visit (INDEPENDENT_AMBULATORY_CARE_PROVIDER_SITE_OTHER): Payer: 59 | Admitting: Family Medicine

## 2014-10-13 VITALS — BP 117/69 | HR 58 | Temp 98.7°F | Resp 20 | Ht 65.0 in | Wt 297.0 lb

## 2014-10-13 DIAGNOSIS — E785 Hyperlipidemia, unspecified: Secondary | ICD-10-CM

## 2014-10-13 DIAGNOSIS — M170 Bilateral primary osteoarthritis of knee: Secondary | ICD-10-CM

## 2014-10-13 DIAGNOSIS — F419 Anxiety disorder, unspecified: Secondary | ICD-10-CM

## 2014-10-13 DIAGNOSIS — F32A Depression, unspecified: Secondary | ICD-10-CM

## 2014-10-13 DIAGNOSIS — K219 Gastro-esophageal reflux disease without esophagitis: Secondary | ICD-10-CM

## 2014-10-13 DIAGNOSIS — F418 Other specified anxiety disorders: Secondary | ICD-10-CM

## 2014-10-13 DIAGNOSIS — D649 Anemia, unspecified: Secondary | ICD-10-CM

## 2014-10-13 DIAGNOSIS — R739 Hyperglycemia, unspecified: Secondary | ICD-10-CM | POA: Diagnosis not present

## 2014-10-13 DIAGNOSIS — F329 Major depressive disorder, single episode, unspecified: Secondary | ICD-10-CM

## 2014-10-13 DIAGNOSIS — I10 Essential (primary) hypertension: Secondary | ICD-10-CM | POA: Diagnosis not present

## 2014-10-13 DIAGNOSIS — E669 Obesity, unspecified: Secondary | ICD-10-CM

## 2014-10-13 LAB — HEMOGLOBIN A1C: HEMOGLOBIN A1C: 6 % (ref 4.6–6.5)

## 2014-10-13 LAB — CBC
HCT: 39.7 % (ref 36.0–46.0)
Hemoglobin: 13 g/dL (ref 12.0–15.0)
MCHC: 32.9 g/dL (ref 30.0–36.0)
MCV: 83.3 fl (ref 78.0–100.0)
Platelets: 275 10*3/uL (ref 150.0–400.0)
RBC: 4.77 Mil/uL (ref 3.87–5.11)
RDW: 15.6 % — ABNORMAL HIGH (ref 11.5–15.5)
WBC: 6.9 10*3/uL (ref 4.0–10.5)

## 2014-10-13 LAB — COMPREHENSIVE METABOLIC PANEL
ALK PHOS: 94 U/L (ref 39–117)
ALT: 25 U/L (ref 0–35)
AST: 23 U/L (ref 0–37)
Albumin: 4 g/dL (ref 3.5–5.2)
BUN: 19 mg/dL (ref 6–23)
CHLORIDE: 103 meq/L (ref 96–112)
CO2: 30 mEq/L (ref 19–32)
Calcium: 9.9 mg/dL (ref 8.4–10.5)
Creatinine, Ser: 0.89 mg/dL (ref 0.40–1.20)
GFR: 67.89 mL/min (ref >60.00)
Glucose, Bld: 98 mg/dL (ref 70–99)
POTASSIUM: 4 meq/L (ref 3.5–5.1)
SODIUM: 139 meq/L (ref 135–145)
TOTAL PROTEIN: 7.5 g/dL (ref 6.0–8.3)
Total Bilirubin: 0.5 mg/dL (ref 0.2–1.2)

## 2014-10-13 LAB — TSH: TSH: 1.2 u[IU]/mL (ref 0.35–4.50)

## 2014-10-13 LAB — LIPID PANEL
CHOL/HDL RATIO: 5
Cholesterol: 151 mg/dL (ref 0–200)
HDL: 32.7 mg/dL — AB (ref >39.00)
NonHDL: 118.3
Triglycerides: 223 mg/dL — ABNORMAL HIGH (ref 0.0–149.0)
VLDL: 44.6 mg/dL — AB (ref 0.0–40.0)

## 2014-10-13 LAB — LDL CHOLESTEROL, DIRECT: LDL DIRECT: 84 mg/dL

## 2014-10-13 MED ORDER — ESCITALOPRAM OXALATE 20 MG PO TABS
20.0000 mg | ORAL_TABLET | Freq: Every day | ORAL | Status: DC
Start: 2014-10-13 — End: 2015-10-16

## 2014-10-13 MED ORDER — DILTIAZEM HCL ER COATED BEADS 120 MG PO CP24: 120.0000 mg | ORAL_CAPSULE | Freq: Every day | ORAL | Status: DC

## 2014-10-13 MED ORDER — LOSARTAN POTASSIUM 100 MG PO TABS: 100.0000 mg | ORAL_TABLET | Freq: Every day | ORAL | Status: DC

## 2014-10-13 MED ORDER — PANTOPRAZOLE SODIUM 40 MG PO TBEC: 40.0000 mg | DELAYED_RELEASE_TABLET | Freq: Two times a day (BID) | ORAL | Status: DC

## 2014-10-13 MED ORDER — NEBIVOLOL HCL 10 MG PO TABS: 10.0000 mg | ORAL_TABLET | Freq: Every day | ORAL | Status: DC

## 2014-10-13 MED ORDER — DICLOFENAC SODIUM 75 MG PO TBEC: 75.0000 mg | DELAYED_RELEASE_TABLET | Freq: Two times a day (BID) | ORAL | Status: DC

## 2014-10-13 NOTE — Patient Instructions (Signed)
Try 1/2 Lasix daily for next week and see if that helps your heavy legs and fatigue, can hold med completely if needed  Hypertension Hypertension, commonly called high blood pressure, is when the force of blood pumping through your arteries is too strong. Your arteries are the blood vessels that carry blood from your heart throughout your body. A blood pressure reading consists of a higher number over a lower number, such as 110/72. The higher number (systolic) is the pressure inside your arteries when your heart pumps. The lower number (diastolic) is the pressure inside your arteries when your heart relaxes. Ideally you want your blood pressure below 120/80. Hypertension forces your heart to work harder to pump blood. Your arteries may become narrow or stiff. Having hypertension puts you at risk for heart disease, stroke, and other problems.  RISK FACTORS Some risk factors for high blood pressure are controllable. Others are not.  Risk factors you cannot control include:   Race. You may be at higher risk if you are African American.  Age. Risk increases with age.  Gender. Men are at higher risk than women before age 79 years. After age 29, women are at higher risk than men. Risk factors you can control include:  Not getting enough exercise or physical activity.  Being overweight.  Getting too much fat, sugar, calories, or salt in your diet.  Drinking too much alcohol. SIGNS AND SYMPTOMS Hypertension does not usually cause signs or symptoms. Extremely high blood pressure (hypertensive crisis) may cause headache, anxiety, shortness of breath, and nosebleed. DIAGNOSIS  To check if you have hypertension, your health care provider will measure your blood pressure while you are seated, with your arm held at the level of your heart. It should be measured at least twice using the same arm. Certain conditions can cause a difference in blood pressure between your right and left arms. A blood  pressure reading that is higher than normal on one occasion does not mean that you need treatment. If one blood pressure reading is high, ask your health care provider about having it checked again. TREATMENT  Treating high blood pressure includes making lifestyle changes and possibly taking medicine. Living a healthy lifestyle can help lower high blood pressure. You may need to change some of your habits. Lifestyle changes may include:  Following the DASH diet. This diet is high in fruits, vegetables, and whole grains. It is low in salt, red meat, and added sugars.  Getting at least 2 hours of brisk physical activity every week.  Losing weight if necessary.  Not smoking.  Limiting alcoholic beverages.  Learning ways to reduce stress. If lifestyle changes are not enough to get your blood pressure under control, your health care provider may prescribe medicine. You may need to take more than one. Work closely with your health care provider to understand the risks and benefits. HOME CARE INSTRUCTIONS  Have your blood pressure rechecked as directed by your health care provider.   Take medicines only as directed by your health care provider. Follow the directions carefully. Blood pressure medicines must be taken as prescribed. The medicine does not work as well when you skip doses. Skipping doses also puts you at risk for problems.   Do not smoke.   Monitor your blood pressure at home as directed by your health care provider. SEEK MEDICAL CARE IF:   You think you are having a reaction to medicines taken.  You have recurrent headaches or feel dizzy.  You have  swelling in your ankles.  You have trouble with your vision. SEEK IMMEDIATE MEDICAL CARE IF:  You develop a severe headache or confusion.  You have unusual weakness, numbness, or feel faint.  You have severe chest or abdominal pain.  You vomit repeatedly.  You have trouble breathing. MAKE SURE YOU:   Understand  these instructions.  Will watch your condition.  Will get help right away if you are not doing well or get worse. Document Released: 06/10/2005 Document Revised: 10/25/2013 Document Reviewed: 04/02/2013 [facility] Patient Information 2015 [place], [place]. This information is not intended to replace advice given to you by your health care provider. Make sure you discuss any questions you have with your health care provider.

## 2014-10-13 NOTE — Progress Notes (Signed)
Pre visit review using our clinic review tool, if applicable. No additional management support is needed unless otherwise documented below in the visit note. 

## 2014-10-14 ENCOUNTER — Other Ambulatory Visit: Payer: Self-pay | Admitting: Family Medicine

## 2014-10-14 DIAGNOSIS — E785 Hyperlipidemia, unspecified: Secondary | ICD-10-CM

## 2014-10-14 MED ORDER — ROSUVASTATIN CALCIUM 20 MG PO TABS: ORAL_TABLET | ORAL | Status: DC

## 2014-10-23 ENCOUNTER — Encounter: Payer: Self-pay | Admitting: Family Medicine

## 2014-10-23 NOTE — Assessment & Plan Note (Signed)
Continues to struggle with significant life stressors with work and caring for her parents. Lexapro 20 mg daily and consider adding counseling.

## 2014-10-23 NOTE — Assessment & Plan Note (Signed)
Increase leafy greens, consider increased lean red meat and using cast iron cookware. Continue to monitor, report any concerns 

## 2014-10-23 NOTE — Progress Notes (Signed)
[name]  [id number] 1950/10/24 10/23/2014      Progress Note-Follow Up  Subjective  Chief Complaint  Chief Complaint  Patient presents with  . Follow-up    BP. Also feels like theres a crawling sensation to the right shoulder for at least 6 months.     HPI  Patient is a 64 y.o. female in today for routine medical care. Patient is in today for follow-up. Has been continually dealing with stressors both at work and at home. Overall she is managing but she acknowledges it feels overwhelming at times. No suicidal ideation but there is some anhedonia. No recent acute illness. Continues to struggle with knee pain although it is slowly improving. Denies CP/palp/SOB/HA/congestion/fevers/GI or GU c/o. Taking meds as prescribed  Past Medical History  Diagnosis Date  . GERD (gastroesophageal reflux disease)   . Anxiety   . Hyperlipidemia   . Hypertension   . Hx of colonic polyps   . Kidney stones   . Ventral hernia   . Chicken pox as a child  . Measles as a child  . Mumps as a child  . Obesity   . Vasomotor rhinitis 04/05/2012  . Sleep apnea 04/05/2012  . Anemia 04/05/2012  . Elevated LFTs 04/05/2012  . OA (osteoarthritis) of knee 04/05/2012  . Tinea corporis 02/23/2013  . Preventative health care 09/05/2013  . Chronic diastolic CHF (congestive heart failure), NYHA class 1   . Complication of anesthesia     pt states feels different in her body after anesthesia when waking up and also experiences a smell of burnt plastic for approx a wk   . PONV (postoperative nausea and vomiting)   . Dysrhythmia     occas palpitations  . Heart murmur     hx of one at birth   . Shortness of breath dyspnea     walking distances / climbing stairs  . Obesity 07/11/2014  . Back pain 10/02/2014  . Anxiety state 09/11/2007    Qualifier: Diagnosis of  By: Scherrie Gerlach      Past Surgical History  Procedure Laterality Date  . Cholecystectomy    . Meniscus repair  2009  . Total knee  arthroplasty  2011    left  . Knee arthroscopy  dec 2011    right  . Mouth surgery      teeth implants  . Hernia repair  02/08/11    ventral hernia  . Cesarean section      X 3  . Wisdom tooth extraction  2000  . Cardiac catheterization      normal coroary arteries per patient  . Tubal ligation    . Cardiovascular stress test      10/12/2013  . Total knee arthroplasty Right 05/10/2014    Procedure: RIGHT TOTAL KNEE ARTHROPLASTY;  Surgeon: Mauri Pole, MD;  Location: [facility];  Service: Orthopedics;  Laterality: Right;  . Left and right heart catheterization with coronary angiogram N/A 11/08/2013    Procedure: LEFT AND RIGHT HEART CATHETERIZATION WITH CORONARY ANGIOGRAM;  Surgeon: [name], MD;  Location: [facility] CATH LAB;  Service: Cardiovascular;  Laterality: N/A;    Family History  Problem Relation Age of Onset  . Thyroid disease Mother   . Hyperlipidemia Mother   . Heart attack Father 37  . Hypertension Father   . Arthritis Father     RA  . Coronary artery disease Father   . Coronary artery disease Brother   . Heart disease  Brother   . Cancer Maternal Aunt     colon  . Cancer Maternal Grandmother     colon  . Heart attack Maternal Grandfather   . Alcohol abuse Paternal Grandfather     History   Social History  . Marital Status: Divorced    Spouse Name: N/A  . Number of Children: N/A  . Years of Education: N/A   Occupational History  . Not on file.   Social History Main Topics  . Smoking status: Never Smoker   . Smokeless tobacco: Never Used  . Alcohol Use: No  . Drug Use: No  . Sexual Activity: No     Comment: lives with mother currently-stress   Other Topics Concern  . Not on file   Social History Narrative    Current Outpatient Prescriptions on File Prior to Visit  Medication Sig Dispense Refill  . albuterol (PROVENTIL HFA;VENTOLIN HFA) 108 (90 BASE) MCG/ACT inhaler Inhale 2 puffs into the lungs every 6 (six) hours as needed for wheezing  or shortness of breath. (Patient not taking: Reported on 09/27/2014) 1 Inhaler 0  . beclomethasone (QVAR) 80 MCG/ACT inhaler Inhale 1 puff into the lungs 2 (two) times daily. (Patient not taking: Reported on 09/27/2014) 1 Inhaler 0  . cetirizine (ZYRTEC) 10 MG tablet Take 1 tablet (10 mg total) by mouth daily. 90 tablet 3  . clotrimazole-betamethasone (LOTRISONE) cream Apply 1 application topically 2 (two) times daily as needed (for rash). (Patient not taking: Reported on 09/27/2014) 30 g 0  . fluticasone (FLONASE) 50 MCG/ACT nasal spray Place 1 spray into both nostrils daily as needed for allergies. 16 g 1  . furosemide (LASIX) 20 MG tablet Take 1 tablet (20 mg total) by mouth daily. 180 tablet 2  . Glucosamine HCl (GLUCOSAMINE PO) Take 1 tablet by mouth at bedtime.     . methocarbamol (ROBAXIN) 500 MG tablet Take 1 tablet (500 mg total) by mouth every 8 (eight) hours as needed for muscle spasms. 60 tablet 1  . omega-3 acid ethyl esters (LOVAZA) 1 G capsule Take 1 g by mouth at bedtime.     Vladimir Faster Glycol-Propyl Glycol (SYSTANE OP) Apply 1 drop to eye 2 (two) times daily as needed (for dry eyes).    . traMADol (ULTRAM) 50 MG tablet Take 1 tablet (50 mg total) by mouth every 8 (eight) hours as needed for moderate pain or severe pain. 60 tablet 0   No current facility-administered medications on file prior to visit.    No Known Allergies  Review of Systems  Review of Systems  Constitutional: Positive for malaise/fatigue. Negative for fever.  HENT: Negative for congestion.   Eyes: Negative for discharge.  Respiratory: Negative for shortness of breath.   Cardiovascular: Negative for chest pain, palpitations and leg swelling.  Gastrointestinal: Negative for nausea, abdominal pain and diarrhea.  Genitourinary: Negative for dysuria.  Musculoskeletal: Positive for joint pain. Negative for falls.  Skin: Negative for rash.  Neurological: Negative for loss of consciousness and headaches.    Endo/Heme/Allergies: Negative for polydipsia.  Psychiatric/Behavioral: Positive for depression. Negative for suicidal ideas. The patient is nervous/anxious. The patient does not have insomnia.     Objective  BP 117/69 mmHg  Pulse 58  Temp(Src) 98.7 F (37.1 C) (Oral)  Resp 20  Ht 5\' 5"  (1.651 m)  Wt 297 lb (134.718 kg)  BMI 49.42 kg/m2  SpO2 97%  Physical Exam  Physical Exam  Constitutional: She is oriented to person, place, and time and  well-developed, well-nourished, and in no distress. No distress.  HENT:  Head: Normocephalic and atraumatic.  Right Ear: External ear normal.  Left Ear: External ear normal.  Nose: Nose normal.  Mouth/Throat: Oropharynx is clear and moist. No oropharyngeal exudate.  Eyes: Conjunctivae are normal. Pupils are equal, round, and reactive to light. Right eye exhibits no discharge. Left eye exhibits no discharge. No scleral icterus.  Neck: Normal range of motion. Neck supple. No thyromegaly present.  Cardiovascular: Normal rate, regular rhythm, normal heart sounds and intact distal pulses.   No murmur heard. Pulmonary/Chest: Effort normal and breath sounds normal. No respiratory distress. She has no wheezes. She has no rales.  Abdominal: Soft. Bowel sounds are normal. She exhibits no distension and no mass. There is no tenderness.  Musculoskeletal: Normal range of motion. She exhibits no edema or tenderness.  Lymphadenopathy:    She has no cervical adenopathy.  Neurological: She is alert and oriented to person, place, and time. She has normal reflexes. No cranial nerve deficit. Coordination normal.  Skin: Skin is warm and dry. No rash noted. She is not diaphoretic.  Psychiatric: Mood, memory and affect normal.    Lab Results  Component Value Date   TSH 1.20 10/13/2014   Lab Results  Component Value Date   WBC 6.9 10/13/2014   HGB 13.0 10/13/2014   HCT 39.7 10/13/2014   MCV 83.3 10/13/2014   PLT 275.0 10/13/2014   Lab Results   Component Value Date   CREATININE 0.89 10/13/2014   BUN 19 10/13/2014   NA 139 10/13/2014   K 4.0 10/13/2014   CL 103 10/13/2014   CO2 30 10/13/2014   Lab Results  Component Value Date   ALT 25 10/13/2014   AST 23 10/13/2014   ALKPHOS 94 10/13/2014   BILITOT 0.5 10/13/2014   Lab Results  Component Value Date   CHOL 151 10/13/2014   Lab Results  Component Value Date   HDL 32.70* 10/13/2014   Lab Results  Component Value Date   LDLCALC 88 07/11/2014   Lab Results  Component Value Date   TRIG 223.0* 10/13/2014   Lab Results  Component Value Date   CHOLHDL 5 10/13/2014     Assessment & Plan  Essential hypertension Well controlled, no changes to meds. Encouraged heart healthy diet such as the DASH diet and exercise as tolerated.    GERD Avoid offending foods, start probiotics. Do not eat large meals in late evening and consider raising head of bed. Continue PPI for now.   Obesity Encouraged DASH diet, decrease po intake and increase exercise as tolerated. Needs 7-8 hours of sleep nightly. Avoid trans fats, eat small, frequent meals every 4-5 hours with lean proteins, complex carbs and healthy fats. Minimize simple carbs, GMO foods.   Anxiety and depression Continues to struggle with significant life stressors with work and caring for her parents. Lexapro 20 mg daily and consider adding counseling.   Anemia Increase leafy greens, consider increased lean red meat and using cast iron cookware. Continue to monitor, report any concerns   OA (osteoarthritis) of knee Healing gradually from her TKR

## 2014-10-23 NOTE — Assessment & Plan Note (Signed)
Well controlled, no changes to meds. Encouraged heart healthy diet such as the DASH diet and exercise as tolerated.  °

## 2014-10-23 NOTE — Assessment & Plan Note (Signed)
Encouraged DASH diet, decrease po intake and increase exercise as tolerated. Needs 7-8 hours of sleep nightly. Avoid trans fats, eat small, frequent meals every 4-5 hours with lean proteins, complex carbs and healthy fats. Minimize simple carbs, GMO foods. 

## 2014-10-23 NOTE — Assessment & Plan Note (Signed)
Healing gradually from her TKR

## 2014-10-23 NOTE — Assessment & Plan Note (Signed)
Avoid offending foods, start probiotics. Do not eat large meals in late evening and consider raising head of bed. Continue PPI for now.

## 2014-12-23 ENCOUNTER — Encounter: Payer: 59 | Admitting: Family Medicine

## 2014-12-29 ENCOUNTER — Encounter: Payer: Self-pay | Admitting: Cardiology

## 2015-01-13 ENCOUNTER — Encounter: Payer: Self-pay | Admitting: Gastroenterology

## 2015-01-18 ENCOUNTER — Other Ambulatory Visit: Payer: Self-pay | Admitting: Gastroenterology

## 2015-02-06 ENCOUNTER — Encounter: Payer: 59 | Admitting: Family Medicine

## 2015-03-16 ENCOUNTER — Encounter (HOSPITAL_COMMUNITY): Payer: Self-pay | Admitting: *Deleted

## 2015-03-27 ENCOUNTER — Telehealth: Payer: Self-pay | Admitting: Family Medicine

## 2015-03-27 NOTE — Telephone Encounter (Signed)
Pre visit letter mailed 03/27/15  °

## 2015-03-28 ENCOUNTER — Ambulatory Visit (HOSPITAL_COMMUNITY): Payer: 59 | Admitting: Anesthesiology

## 2015-03-28 ENCOUNTER — Encounter (HOSPITAL_COMMUNITY)
Admission: RE | Disposition: A | Discharge status: Home / Self Care | Payer: Self-pay | Source: Ambulatory Visit | Attending: Gastroenterology

## 2015-03-28 ENCOUNTER — Encounter (HOSPITAL_COMMUNITY): Payer: Self-pay

## 2015-03-28 ENCOUNTER — Ambulatory Visit (HOSPITAL_COMMUNITY)
Admission: RE | Admit: 2015-03-28 | Discharge: 2015-03-28 | Disposition: A | Discharge status: Home / Self Care | Payer: 59 | Source: Ambulatory Visit | Attending: Gastroenterology | Admitting: Gastroenterology

## 2015-03-28 DIAGNOSIS — M179 Osteoarthritis of knee, unspecified: Secondary | ICD-10-CM | POA: Insufficient documentation

## 2015-03-28 DIAGNOSIS — Z791 Long term (current) use of non-steroidal anti-inflammatories (NSAID): Secondary | ICD-10-CM | POA: Insufficient documentation

## 2015-03-28 DIAGNOSIS — E785 Hyperlipidemia, unspecified: Secondary | ICD-10-CM | POA: Insufficient documentation

## 2015-03-28 DIAGNOSIS — Z7951 Long term (current) use of inhaled steroids: Secondary | ICD-10-CM | POA: Insufficient documentation

## 2015-03-28 DIAGNOSIS — I5032 Chronic diastolic (congestive) heart failure: Secondary | ICD-10-CM | POA: Diagnosis not present

## 2015-03-28 DIAGNOSIS — Z86010 Personal history of colonic polyps: Secondary | ICD-10-CM | POA: Diagnosis not present

## 2015-03-28 DIAGNOSIS — F419 Anxiety disorder, unspecified: Secondary | ICD-10-CM | POA: Diagnosis not present

## 2015-03-28 DIAGNOSIS — I10 Essential (primary) hypertension: Secondary | ICD-10-CM | POA: Diagnosis not present

## 2015-03-28 DIAGNOSIS — Z6841 Body Mass Index (BMI) 40.0 and over, adult: Secondary | ICD-10-CM | POA: Insufficient documentation

## 2015-03-28 DIAGNOSIS — Z79899 Other long term (current) drug therapy: Secondary | ICD-10-CM | POA: Insufficient documentation

## 2015-03-28 DIAGNOSIS — G4730 Sleep apnea, unspecified: Secondary | ICD-10-CM | POA: Insufficient documentation

## 2015-03-28 DIAGNOSIS — Z800 Family history of malignant neoplasm of digestive organs: Secondary | ICD-10-CM | POA: Insufficient documentation

## 2015-03-28 DIAGNOSIS — Z1211 Encounter for screening for malignant neoplasm of colon: Secondary | ICD-10-CM | POA: Insufficient documentation

## 2015-03-28 DIAGNOSIS — I2510 Atherosclerotic heart disease of native coronary artery without angina pectoris: Secondary | ICD-10-CM | POA: Insufficient documentation

## 2015-03-28 DIAGNOSIS — K219 Gastro-esophageal reflux disease without esophagitis: Secondary | ICD-10-CM | POA: Insufficient documentation

## 2015-03-28 DIAGNOSIS — K5730 Diverticulosis of large intestine without perforation or abscess without bleeding: Secondary | ICD-10-CM | POA: Insufficient documentation

## 2015-03-28 DIAGNOSIS — Z96651 Presence of right artificial knee joint: Secondary | ICD-10-CM | POA: Diagnosis not present

## 2015-03-28 DIAGNOSIS — D123 Benign neoplasm of transverse colon: Secondary | ICD-10-CM | POA: Insufficient documentation

## 2015-03-28 HISTORY — PX: COLONOSCOPY WITH PROPOFOL: SHX5780

## 2015-03-28 SURGERY — COLONOSCOPY WITH PROPOFOL
Anesthesia: Monitor Anesthesia Care

## 2015-03-28 MED ORDER — ONDANSETRON HCL 4 MG/2ML IJ SOLN
INTRAMUSCULAR | Status: AC
  Filled 2015-03-28: qty 2

## 2015-03-28 MED ORDER — ONDANSETRON HCL 4 MG/2ML IJ SOLN
INTRAMUSCULAR | Status: DC | PRN
Start: 2015-03-28 — End: 2015-03-28
  Administered 2015-03-28: 4 mg via INTRAVENOUS

## 2015-03-28 MED ORDER — LACTATED RINGERS IV SOLN
INTRAVENOUS | Status: DC
  Administered 2015-03-28: 07:00:00 via INTRAVENOUS

## 2015-03-28 MED ORDER — PROPOFOL 500 MG/50ML IV EMUL
INTRAVENOUS | Status: DC | PRN
  Administered 2015-03-28: 200 ug/kg/min via INTRAVENOUS

## 2015-03-28 MED ORDER — PROPOFOL 10 MG/ML IV BOLUS
INTRAVENOUS | Status: AC
  Filled 2015-03-28: qty 20

## 2015-03-28 MED ORDER — SODIUM CHLORIDE 0.9 % IV SOLN: INTRAVENOUS | Status: DC

## 2015-03-28 SURGICAL SUPPLY — 21 items

## 2015-03-28 NOTE — Anesthesia Postprocedure Evaluation (Signed)
  Anesthesia Post-op Note  Patient: [name]  Procedure(s) Performed: Procedure(s) (LRB): COLONOSCOPY WITH PROPOFOL (N/A)  Patient Location: PACU  Anesthesia Type: MAC  Level of Consciousness: awake and alert   Airway and Oxygen Therapy: Patient Spontanous Breathing  Post-op Pain: mild  Post-op Assessment: Post-op Vital signs reviewed, Patient's Cardiovascular Status Stable, Respiratory Function Stable, Patent Airway and No signs of Nausea or vomiting  Last Vitals:  Filed Vitals:   03/28/15 0759  BP: 120/53  Pulse:   Temp:   Resp: 14    Post-op Vital Signs: stable   Complications: No apparent anesthesia complications

## 2015-03-28 NOTE — Op Note (Signed)
[facility] [place], [address]   OPERATIVE PROCEDURE REPORT  PATIENT: [name] [name]  MR#: [id number] BIRTHDATE: Jul 03, 1950 GENDER: female ENDOSCOPIST: Edmonia James, MD ASSISTANT:   Sharon Mt, technician & Tory Emerald, RN. PROCEDURE DATE: April 27, 2015 PRE-PROCEDURE PREPARATION: The patient was prepped with a gallon of Golytely the night prior to the procedure.  The patient has fasted for 4 hours prior to the procedure. PRE-PROCEDURE PHYSICAL: Patient has stable vital signs.  Neck is supple.  There is no JVD, thyromegaly or LAD.  Chest clear to auscultation.  S1 and S2 regular.  Abdomen soft, morbidly obese, non-distended, non-tender with NABS. PROCEDURE:     Colonoscopy with cold biopsy x 1. ASA CLASS:     Class III INDICATIONS:     1.  Colorectal cancer screening 2. Family history of colon cancer. MEDICATIONS:     Monitored anesthesia care  DESCRIPTION OF PROCEDURE: After the risks, benefits, and alternatives of the procedure were thoroughly explained [including a 10% missed rate of cancer and polyps], informed consent was obtained. Digital rectal exam was performed. he Pentax adult Colonscope [id number]  was introduced through the anus  and advanced to the terminal ileum which was intubated for a short distance , limited by No adverse events experienced. The quality of the prep was adequate . Multiple washes were done. Small lesions could be missed. The instrument was then slowly withdrawn as the colon was fully examined.     COLON FINDINGS: There were scattered diverticula, small and large, noted throughout the entire examined colon. Inspissated stool was noted in xseveral of the diverticula in the sigmoid colon. A single dimunitive polyp was found in the distal transverse colon and was removed by cold biopsy x 1. The examined terminal ileum appeared to be normal.  The rest of the colonic mucosa appeared healthy with a normal  vascular pattern. No masses or AVMs were noted. The appendiceal orifice and the ICV were identified and photographed. The terminal ileum appeared normal.  Retroflexed views revealed no abnormalities.  The patient tolerated the procedure without immediate complications.  The scope was then withdrawn from the patient and the procedure terminated.  TIME TO CECUM:   6 minutes 00 seconds WITHDRAW TIME:  6 minutes 00 seconds  IMPRESSION:     1) Diverticulosis was noted throughout the entire examined colon 2) One dimunitive polyp removed by cold biopsy x 1, from the distal transverse colon. 3) Otherwise normal exam upto the terminal ileum.  RECOMMENDATIONS:     1.  Await pathology results. 2.  Continue current medications. 3.  Continue surveillance. 4.  High fiber diet with liberal fluid intake. 5.  OP follow-up is advised on a PRN basis.  REPEAT EXAM:      In 7 years  for a repeat colonoscopy.  If the patient has any abnormal GI symptoms in the interim, she has been advised to contact the office as soon as possible for further recommendations.   REFERRED ZD:GUYQI Charlett Blake, M.D. Everlene Farrier, M.D. eSigned:  Edmonia James, MD 04/27/15 7:56 AM  CPT CODES:     [phone] Colonoscopy, flexible, proximal to splenic flexure; with biopsy, single or multiple ICD CODES:     Z12.11 CRC screening Z80.0 Family history of colon cancer K63.5 colonic polyp K57.30 Diverticulosis  The ICD and CPT codes recommended by this software are interpretations from the data that the clinical staff has captured with the software.  The verification of the translation of this report  to the ICD and CPT codes and modifiers is the sole responsibility of the health care institution and practicing physician where this report was generated.  [facility] will not be held responsible for the validity of the ICD and CPT codes included on this report.  AMA assumes no liability for data contained or not  contained herein. CPT is a Designer, television/film set of the [employer].  PATIENT NAME:  [name] [name] MR#: [id number]

## 2015-03-28 NOTE — Anesthesia Preprocedure Evaluation (Addendum)
Anesthesia Evaluation  Patient identified by MRN, date of birth, ID band Patient awake    Reviewed: Allergy & Precautions, H&P , NPO status , Patient's Chart, lab work & pertinent test results  History of Anesthesia Complications (+) PONV  Airway Mallampati: II  TM Distance: >3 FB Neck ROM: Full    Dental no notable dental hx.    Pulmonary shortness of breath and with exertion, sleep apnea ,    Pulmonary exam normal breath sounds clear to auscultation       Cardiovascular hypertension, Pt. on medications and Pt. on home beta blockers + CAD and +CHF  Normal cardiovascular exam+ dysrhythmias + Valvular Problems/Murmurs  Rhythm:Regular Rate:Normal     Neuro/Psych Anxiety negative neurological ROS     GI/Hepatic Neg liver ROS, GERD  ,  Endo/Other  Morbid obesity  Renal/GU Renal disease  negative genitourinary   Musculoskeletal  (+) Arthritis ,   Abdominal   Peds negative pediatric ROS (+)  Hematology  (+) anemia ,   Anesthesia Other Findings   Reproductive/Obstetrics negative OB ROS                            Anesthesia Physical  Anesthesia Plan  ASA: III  Anesthesia Plan: MAC   Post-op Pain Management:    Induction:   Airway Management Planned: Simple Face Mask  Additional Equipment:   Intra-op Plan:   Post-operative Plan:   Informed Consent: I have reviewed the patients History and Physical, chart, labs and discussed the procedure including the risks, benefits and alternatives for the proposed anesthesia with the patient or authorized representative who has indicated his/her understanding and acceptance.   Dental advisory given  Plan Discussed with: CRNA  Anesthesia Plan Comments: (Discussed spinal and general. Discussed risks/benefits of spinal including headache, backache, failure, bleeding, infection, and nerve damage. Patient consents to spinal. Questions answered.  Coagulation studies and platelet count acceptable.)        Anesthesia Quick Evaluation

## 2015-03-28 NOTE — H&P (Signed)
[name] is an 64 y.o. female.   Chief Complaint: Colorectal cancer screening. HPI: 64 year old white female, here for colorectal cancer screening. She has severe sleep apnea along with multiple medical problems listed below. See office notes for details.  Past Medical History  Diagnosis Date  . GERD (gastroesophageal reflux disease)   . Anxiety   . Hyperlipidemia   . Hypertension   . Hx of colonic polyps   . Ventral hernia   . Chicken pox as a child  . Measles as a child  . Mumps as a child  . Obesity   . Vasomotor rhinitis 04/05/2012  . Sleep apnea 04/05/2012  . Elevated LFTs 04/05/2012  . OA (osteoarthritis) of knee 04/05/2012  . Tinea corporis 02/23/2013  . Preventative health care 09/05/2013  . Chronic diastolic CHF (congestive heart failure), NYHA class 1 ([place])   . Complication of anesthesia     pt states feels different in her body after anesthesia when waking up and also experiences a smell of burnt plastic for approx a wk   . PONV (postoperative nausea and vomiting)   . Dysrhythmia     occas palpitations  . Heart murmur     hx of one at birth   . Shortness of breath dyspnea     walking distances / climbing stairs  . Obesity 07/11/2014  . Back pain 10/02/2014  . Anxiety state 09/11/2007    Qualifier: Diagnosis of  By: Scherrie Gerlach    . Kidney stones     cyst on kidney Dr Dorina Hoyer  . Anemia 04/05/2012   Past Surgical History  Procedure Laterality Date  . Cholecystectomy    . Meniscus repair  2009  . Total knee arthroplasty  2011    left  . Knee arthroscopy  dec 2011    right  . Mouth surgery      teeth implants  . Hernia repair  02/08/11    ventral hernia  . Cesarean section      X 3  . Wisdom tooth extraction  2000  . Cardiac catheterization      normal coroary arteries per patient  . Tubal ligation    . Cardiovascular stress test      10/12/2013  . Total knee arthroplasty Right 05/10/2014    Procedure: RIGHT TOTAL KNEE ARTHROPLASTY;  Surgeon:  Mauri Pole, MD;  Location: [facility];  Service: Orthopedics;  Laterality: Right;  . Left and right heart catheterization with coronary angiogram N/A 11/08/2013    Procedure: LEFT AND RIGHT HEART CATHETERIZATION WITH CORONARY ANGIOGRAM;  Surgeon: [name], MD;  Location: [facility] CATH LAB;  Service: Cardiovascular;  Laterality: N/A;  . Joint replacement      bilateral   Family History  Problem Relation Age of Onset  . Thyroid disease Mother   . Hyperlipidemia Mother   . Heart attack Father 55  . Hypertension Father   . Arthritis Father     RA  . Coronary artery disease Father   . Coronary artery disease Brother   . Heart disease Brother   . Cancer Maternal Aunt     colon  . Cancer Maternal Grandmother     colon  . Heart attack Maternal Grandfather   . Alcohol abuse Paternal Grandfather    Social History:  reports that she has never smoked. She has never used smokeless tobacco. She reports that she does not drink alcohol or use illicit drugs.  Allergies: No Known Allergies  Medications Prior to Admission  Medication Sig Dispense Refill  . diclofenac (VOLTAREN) 75 MG EC tablet Take 1 tablet (75 mg total) by mouth 2 (two) times daily. 180 tablet 1  . diltiazem (CARDIZEM CD) 120 MG 24 hr capsule Take 1 capsule (120 mg total) by mouth daily. (Patient taking differently: Take 120 mg by mouth every morning. ) 90 capsule 3  . escitalopram (LEXAPRO) 20 MG tablet Take 1 tablet (20 mg total) by mouth daily. 90 tablet 3  . fexofenadine (ALLEGRA) 180 MG tablet Take 180 mg by mouth daily.    . furosemide (LASIX) 20 MG tablet Take 1 tablet (20 mg total) by mouth daily. (Patient taking differently: Take 20 mg by mouth every morning. ) 180 tablet 2  . losartan (COZAAR) 100 MG tablet Take 1 tablet (100 mg total) by mouth daily. (Patient taking differently: Take 100 mg by mouth every morning. ) 90 tablet 3  . LOTEMAX 0.5 % GEL INSTILLL 1 DROP INTO BOTH EYES THREE TIMES DAILY  0  . nebivolol  (BYSTOLIC) 10 MG tablet Take 1 tablet (10 mg total) by mouth at bedtime. Take 10 mg by mouth at bedtime. 90 tablet 1  . pantoprazole (PROTONIX) 40 MG tablet Take 1 tablet (40 mg total) by mouth 2 (two) times daily. 180 tablet 3  . Polyethyl Glycol-Propyl Glycol (SYSTANE OP) Apply 1 drop to eye 2 (two) times daily as needed (for dry eyes).    . rosuvastatin (CRESTOR) 20 MG tablet Take 1 tablet by 5 days a week and add an extra 1/2 by mouth twice a week. (Patient taking differently: Take 20 mg by mouth at bedtime. -.) 135 tablet 1  . traMADol (ULTRAM) 50 MG tablet Take 1 tablet (50 mg total) by mouth every 8 (eight) hours as needed for moderate pain or severe pain. 60 tablet 0  . albuterol (PROVENTIL HFA;VENTOLIN HFA) 108 (90 BASE) MCG/ACT inhaler Inhale 2 puffs into the lungs every 6 (six) hours as needed for wheezing or shortness of breath. (Patient not taking: Reported on 03/08/2015) 1 Inhaler 0  . beclomethasone (QVAR) 80 MCG/ACT inhaler Inhale 1 puff into the lungs 2 (two) times daily. (Patient not taking: Reported on 09/27/2014) 1 Inhaler 0  . cetirizine (ZYRTEC) 10 MG tablet Take 1 tablet (10 mg total) by mouth daily. (Patient not taking: Reported on 03/08/2015) 90 tablet 3  . clotrimazole-betamethasone (LOTRISONE) cream Apply 1 application topically 2 (two) times daily as needed (for rash). (Patient not taking: Reported on 09/27/2014) 30 g 0  . fluticasone (FLONASE) 50 MCG/ACT nasal spray Place 1 spray into both nostrils daily as needed for allergies. (Patient not taking: Reported on 03/08/2015) 16 g 1  . methocarbamol (ROBAXIN) 500 MG tablet Take 1 tablet (500 mg total) by mouth every 8 (eight) hours as needed for muscle spasms. (Patient not taking: Reported on 03/08/2015) 60 tablet 1   Review of Systems  Constitutional: Negative.   HENT: Negative.   Eyes: Negative.   Respiratory: Negative.   Cardiovascular: Negative.   Gastrointestinal: Positive for heartburn.  Musculoskeletal: Positive for joint  pain.  Skin: Negative.   Neurological: Negative.   Endo/Heme/Allergies: Negative.    Blood pressure 169/83, pulse 67, temperature 98.5 F (36.9 C), temperature source Oral, resp. rate 19, height 5\' 5"  (1.651 m), weight 134.718 kg (297 lb), SpO2 96 %. Physical Exam  Constitutional: She is oriented to person, place, and time. She appears well-developed and well-nourished.  HENT:  Head: Normocephalic and atraumatic.  Eyes:  Conjunctivae and EOM are normal. Pupils are equal, round, and reactive to light.  Neck: Normal range of motion. Neck supple.  Cardiovascular: Normal rate and regular rhythm.   Respiratory: Effort normal and breath sounds normal.  GI: Soft. Bowel sounds are normal.  Morbidly obese  Musculoskeletal: Normal range of motion.  Neurological: She is alert and oriented to person, place, and time.  Skin: Skin is warm and dry.  Psychiatric: She has a normal mood and affect. Her behavior is normal. Judgment and thought content normal.   Assessment/Plan Colorectal cancer screening/Family history of colon cancer: proceed with a colonoscopy at this time.   Azel Gumina 03/28/2015, 7:06 AM

## 2015-03-28 NOTE — Transfer of Care (Signed)
Immediate Anesthesia Transfer of Care Note  Patient: [name]  Procedure(s) Performed: Procedure(s): COLONOSCOPY WITH PROPOFOL (N/A)  Patient Location: PACU  Anesthesia Type:MAC  Level of Consciousness: awake, alert , oriented and patient cooperative  Airway & Oxygen Therapy: Patient Spontanous Breathing and Patient connected to face mask oxygen  Post-op Assessment: Report given to RN, Post -op Vital signs reviewed and stable and Patient moving all extremities X 4  Post vital signs: stable  Last Vitals:  Filed Vitals:   03/28/15 0747  BP: 119/49  Pulse: 67  Temp:   Resp: 20    Complications: No apparent anesthesia complications

## 2015-03-28 NOTE — Discharge Instructions (Signed)
Colonoscopy, Care After °These instructions give you information on caring for yourself after your procedure. Your doctor may also give you more specific instructions. Call your doctor if you have any problems or questions after your procedure. °HOME CARE °· Do not drive for 24 hours. °· Do not sign important papers or use machinery for 24 hours. °· You may shower. °· You may go back to your usual activities, but go slower for the first 24 hours. °· Take rest breaks often during the first 24 hours. °· Walk around or use warm packs on your belly (abdomen) if you have belly cramping or gas. °· Drink enough fluids to keep your pee (urine) clear or pale yellow. °· Resume your normal diet. Avoid heavy or fried foods. °· Avoid drinking alcohol for 24 hours or as told by your doctor. °· Only take medicines as told by your doctor. °If a tissue sample (biopsy) was taken during the procedure:  °· Do not take aspirin or blood thinners for 7 days, or as told by your doctor. °· Do not drink alcohol for 7 days, or as told by your doctor. °· Eat soft foods for the first 24 hours. °GET HELP IF: °You still have a small amount of blood in your poop (stool) 2-3 days after the procedure. °GET HELP RIGHT AWAY IF: °· You have more than a small amount of blood in your poop. °· You see clumps of tissue (blood clots) in your poop. °· Your belly is puffy (swollen). °· You feel sick to your stomach (nauseous) or throw up (vomit). °· You have a fever. °· You have belly pain that gets worse and medicine does not help. °MAKE SURE YOU: °· Understand these instructions. °· Will watch your condition. °· Will get help right away if you are not doing well or get worse. °Document Released: 07/13/2010 Document Revised: 06/15/2013 Document Reviewed: 02/15/2013 °[employer]® Patient Information ©2015 [employer]. This information is not intended to replace advice given to you by your health care provider. Make sure you discuss any questions you have with  your health care provider. ° °

## 2015-03-29 ENCOUNTER — Encounter (HOSPITAL_COMMUNITY): Payer: Self-pay | Admitting: Gastroenterology

## 2015-04-17 ENCOUNTER — Encounter: Payer: Self-pay | Admitting: Family Medicine

## 2015-04-17 ENCOUNTER — Ambulatory Visit (INDEPENDENT_AMBULATORY_CARE_PROVIDER_SITE_OTHER): Payer: 59 | Admitting: Family Medicine

## 2015-04-17 VITALS — BP 140/88 | HR 77 | Temp 98.4°F | Ht 65.0 in | Wt 310.1 lb

## 2015-04-17 DIAGNOSIS — I10 Essential (primary) hypertension: Secondary | ICD-10-CM | POA: Diagnosis not present

## 2015-04-17 DIAGNOSIS — E785 Hyperlipidemia, unspecified: Secondary | ICD-10-CM | POA: Diagnosis not present

## 2015-04-17 DIAGNOSIS — Z1239 Encounter for other screening for malignant neoplasm of breast: Secondary | ICD-10-CM

## 2015-04-17 DIAGNOSIS — T7840XD Allergy, unspecified, subsequent encounter: Secondary | ICD-10-CM

## 2015-04-17 DIAGNOSIS — R739 Hyperglycemia, unspecified: Secondary | ICD-10-CM

## 2015-04-17 DIAGNOSIS — J4520 Mild intermittent asthma, uncomplicated: Secondary | ICD-10-CM

## 2015-04-17 DIAGNOSIS — E669 Obesity, unspecified: Secondary | ICD-10-CM

## 2015-04-17 DIAGNOSIS — K219 Gastro-esophageal reflux disease without esophagitis: Secondary | ICD-10-CM

## 2015-04-17 DIAGNOSIS — Z0000 Encounter for general adult medical examination without abnormal findings: Secondary | ICD-10-CM

## 2015-04-17 DIAGNOSIS — G4733 Obstructive sleep apnea (adult) (pediatric): Secondary | ICD-10-CM

## 2015-04-17 DIAGNOSIS — L578 Other skin changes due to chronic exposure to nonionizing radiation: Secondary | ICD-10-CM

## 2015-04-17 MED ORDER — FUROSEMIDE 20 MG PO TABS: 20.0000 mg | ORAL_TABLET | Freq: Every day | ORAL | Status: DC

## 2015-04-17 MED ORDER — LOSARTAN POTASSIUM 100 MG PO TABS: 100.0000 mg | ORAL_TABLET | Freq: Every day | ORAL | Status: DC

## 2015-04-17 MED ORDER — BECLOMETHASONE DIPROPIONATE 80 MCG/ACT IN AERS: 1.0000 | INHALATION_SPRAY | Freq: Two times a day (BID) | RESPIRATORY_TRACT | Status: DC

## 2015-04-17 MED ORDER — ROSUVASTATIN CALCIUM 20 MG PO TABS: 20.0000 mg | ORAL_TABLET | Freq: Every day | ORAL | Status: DC

## 2015-04-17 MED ORDER — PANTOPRAZOLE SODIUM 40 MG PO TBEC: 40.0000 mg | DELAYED_RELEASE_TABLET | Freq: Two times a day (BID) | ORAL | Status: DC

## 2015-04-17 MED ORDER — DICLOFENAC SODIUM 75 MG PO TBEC: 75.0000 mg | DELAYED_RELEASE_TABLET | Freq: Two times a day (BID) | ORAL | Status: DC

## 2015-04-17 MED ORDER — NEBIVOLOL HCL 10 MG PO TABS: 10.0000 mg | ORAL_TABLET | Freq: Every day | ORAL | Status: DC

## 2015-04-17 MED ORDER — MONTELUKAST SODIUM 10 MG PO TABS: 10.0000 mg | ORAL_TABLET | Freq: Every day | ORAL | Status: DC

## 2015-04-17 NOTE — Assessment & Plan Note (Signed)
Using CPAP daily now

## 2015-04-17 NOTE — Patient Instructions (Signed)
Preventive Care for Adults, Female A healthy lifestyle and preventive care can promote health and wellness. Preventive health guidelines for women include the following key practices.  A routine yearly physical is a good way to check with your health care provider about your health and preventive screening. It is a chance to share any concerns and updates on your health and to receive a thorough exam.  Visit your dentist for a routine exam and preventive care every 6 months. Brush your teeth twice a day and floss once a day. Good oral hygiene prevents tooth decay and gum disease.  The frequency of eye exams is based on your age, health, family medical history, use of contact lenses, and other factors. Follow your health care provider's recommendations for frequency of eye exams.  Eat a healthy diet. Foods like vegetables, fruits, whole grains, low-fat dairy products, and lean protein foods contain the nutrients you need without too many calories. Decrease your intake of foods high in solid fats, added sugars, and salt. Eat the right amount of calories for you.Get information about a proper diet from your health care provider, if necessary.  Regular physical exercise is one of the most important things you can do for your health. Most adults should get at least 150 minutes of moderate-intensity exercise (any activity that increases your heart rate and causes you to sweat) each week. In addition, most adults need muscle-strengthening exercises on 2 or more days a week.  Maintain a healthy weight. The body mass index (BMI) is a screening tool to identify possible weight problems. It provides an estimate of body fat based on height and weight. Your health care provider can find your BMI and can help you achieve or maintain a healthy weight.For adults 20 years and older:  A BMI below 18.5 is considered underweight.  A BMI of 18.5 to 24.9 is normal.  A BMI of 25 to 29.9 is considered overweight.  A  BMI of 30 and above is considered obese.  Maintain normal blood lipids and cholesterol levels by exercising and minimizing your intake of saturated fat. Eat a balanced diet with plenty of fruit and vegetables. Blood tests for lipids and cholesterol should begin at age 20 and be repeated every 5 years. If your lipid or cholesterol levels are high, you are over 50, or you are at high risk for heart disease, you may need your cholesterol levels checked more frequently.Ongoing high lipid and cholesterol levels should be treated with medicines if diet and exercise are not working.  If you smoke, find out from your health care provider how to quit. If you do not use tobacco, do not start.  Lung cancer screening is recommended for adults aged 13-80 years who are at high risk for developing lung cancer because of a history of smoking. A yearly low-dose CT scan of the lungs is recommended for people who have at least a 30-pack-year history of smoking and are a current smoker or have quit within the past 15 years. A pack year of smoking is smoking an average of 1 pack of cigarettes a day for 1 year (for example: 1 pack a day for 30 years or 2 packs a day for 15 years). Yearly screening should continue until the smoker has stopped smoking for at least 15 years. Yearly screening should be stopped for people who develop a health problem that would prevent them from having lung cancer treatment.  If you are pregnant, do not drink alcohol. If you are  breastfeeding, be very cautious about drinking alcohol. If you are not pregnant and choose to drink alcohol, do not have more than 1 drink per day. One drink is considered to be 12 ounces (355 mL) of beer, 5 ounces (148 mL) of wine, or 1.5 ounces (44 mL) of liquor.  Avoid use of street drugs. Do not share needles with anyone. Ask for help if you need support or instructions about stopping the use of drugs.  High blood pressure causes heart disease and increases the risk  of stroke. Your blood pressure should be checked at least every 1 to 2 years. Ongoing high blood pressure should be treated with medicines if weight loss and exercise do not work.  If you are 27-32 years old, ask your health care provider if you should take aspirin to prevent strokes.  Diabetes screening is done by taking a blood sample to check your blood glucose level after you have not eaten for a certain period of time (fasting). If you are not overweight and you do not have risk factors for diabetes, you should be screened once every 3 years starting at age 46. If you are overweight or obese and you are 88-45 years of age, you should be screened for diabetes every year as part of your cardiovascular risk assessment.  Breast cancer screening is essential preventive care for women. You should practice "breast self-awareness." This means understanding the normal appearance and feel of your breasts and may include breast self-examination. Any changes detected, no matter how small, should be reported to a health care provider. Women in their 41s and 30s should have a clinical breast exam (CBE) by a health care provider as part of a regular health exam every 1 to 3 years. After age 64, women should have a CBE every year. Starting at age 56, women should consider having a mammogram (breast X-ray test) every year. Women who have a family history of breast cancer should talk to their health care provider about genetic screening. Women at a high risk of breast cancer should talk to their health care providers about having an MRI and a mammogram every year.  Breast cancer gene (BRCA)-related cancer risk assessment is recommended for women who have family members with BRCA-related cancers. BRCA-related cancers include breast, ovarian, tubal, and peritoneal cancers. Having family members with these cancers may be associated with an increased risk for harmful changes (mutations) in the breast cancer genes BRCA1 and  BRCA2. Results of the assessment will determine the need for genetic counseling and BRCA1 and BRCA2 testing.  Your health care provider may recommend that you be screened regularly for cancer of the pelvic organs (ovaries, uterus, and vagina). This screening involves a pelvic examination, including checking for microscopic changes to the surface of your cervix (Pap test). You may be encouraged to have this screening done every 3 years, beginning at age 58.  For women ages 75-65, health care providers may recommend pelvic exams and Pap testing every 3 years, or they may recommend the Pap and pelvic exam, combined with testing for human papilloma virus (HPV), every 5 years. Some types of HPV increase your risk of cervical cancer. Testing for HPV may also be done on women of any age with unclear Pap test results.  Other health care providers may not recommend any screening for nonpregnant women who are considered low risk for pelvic cancer and who do not have symptoms. Ask your health care provider if a screening pelvic exam is right for  you.  If you have had past treatment for cervical cancer or a condition that could lead to cancer, you need Pap tests and screening for cancer for at least 20 years after your treatment. If Pap tests have been discontinued, your risk factors (such as having a new sexual partner) need to be reassessed to determine if screening should resume. Some women have medical problems that increase the chance of getting cervical cancer. In these cases, your health care provider may recommend more frequent screening and Pap tests.  Colorectal cancer can be detected and often prevented. Most routine colorectal cancer screening begins at the age of 50 years and continues through age 75 years. However, your health care provider may recommend screening at an earlier age if you have risk factors for colon cancer. On a yearly basis, your health care provider may provide home test kits to check  for hidden blood in the stool. Use of a small camera at the end of a tube, to directly examine the colon (sigmoidoscopy or colonoscopy), can detect the earliest forms of colorectal cancer. Talk to your health care provider about this at age 50, when routine screening begins. Direct exam of the colon should be repeated every 5-10 years through age 75 years, unless early forms of precancerous polyps or small growths are found.  People who are at an increased risk for hepatitis B should be screened for this virus. You are considered at high risk for hepatitis B if:  You were born in a country where hepatitis B occurs often. Talk with your health care provider about which countries are considered high risk.  Your parents were born in a high-risk country and you have not received a shot to protect against hepatitis B (hepatitis B vaccine).  You have HIV or AIDS.  You use needles to inject street drugs.  You live with, or have sex with, someone who has hepatitis B.  You get hemodialysis treatment.  You take certain medicines for conditions like cancer, organ transplantation, and autoimmune conditions.  Hepatitis C blood testing is recommended for all people born from 1945 through 1965 and any individual with known risks for hepatitis C.  Practice safe sex. Use condoms and avoid high-risk sexual practices to reduce the spread of sexually transmitted infections (STIs). STIs include gonorrhea, chlamydia, syphilis, trichomonas, herpes, HPV, and human immunodeficiency virus (HIV). Herpes, HIV, and HPV are viral illnesses that have no cure. They can result in disability, cancer, and death.  You should be screened for sexually transmitted illnesses (STIs) including gonorrhea and chlamydia if:  You are sexually active and are younger than 24 years.  You are older than 24 years and your health care provider tells you that you are at risk for this type of infection.  Your sexual activity has changed  since you were last screened and you are at an increased risk for chlamydia or gonorrhea. Ask your health care provider if you are at risk.  If you are at risk of being infected with HIV, it is recommended that you take a prescription medicine daily to prevent HIV infection. This is called preexposure prophylaxis (PrEP). You are considered at risk if:  You are sexually active and do not regularly use condoms or know the HIV status of your partner(s).  You take drugs by injection.  You are sexually active with a partner who has HIV.  Talk with your health care provider about whether you are at high risk of being infected with HIV. If   you choose to begin PrEP, you should first be tested for HIV. You should then be tested every 3 months for as long as you are taking PrEP.  Osteoporosis is a disease in which the bones lose minerals and strength with aging. This can result in serious bone fractures or breaks. The risk of osteoporosis can be identified using a bone density scan. Women ages 67 years and over and women at risk for fractures or osteoporosis should discuss screening with their health care providers. Ask your health care provider whether you should take a calcium supplement or vitamin D to reduce the rate of osteoporosis.  Menopause can be associated with physical symptoms and risks. Hormone replacement therapy is available to decrease symptoms and risks. You should talk to your health care provider about whether hormone replacement therapy is right for you.  Use sunscreen. Apply sunscreen liberally and repeatedly throughout the day. You should seek shade when your shadow is shorter than you. Protect yourself by wearing long sleeves, pants, a wide-brimmed hat, and sunglasses year round, whenever you are outdoors.  Once a month, do a whole body skin exam, using a mirror to look at the skin on your back. Tell your health care provider of new moles, moles that have irregular borders, moles that  are larger than a pencil eraser, or moles that have changed in shape or color.  Stay current with required vaccines (immunizations).  Influenza vaccine. All adults should be immunized every year.  Tetanus, diphtheria, and acellular pertussis (Td, Tdap) vaccine. Pregnant women should receive 1 dose of Tdap vaccine during each pregnancy. The dose should be obtained regardless of the length of time since the last dose. Immunization is preferred during the 27th-36th week of gestation. An adult who has not previously received Tdap or who does not know her vaccine status should receive 1 dose of Tdap. This initial dose should be followed by tetanus and diphtheria toxoids (Td) booster doses every 10 years. Adults with an unknown or incomplete history of completing a 3-dose immunization series with Td-containing vaccines should begin or complete a primary immunization series including a Tdap dose. Adults should receive a Td booster every 10 years.  Varicella vaccine. An adult without evidence of immunity to varicella should receive 2 doses or a second dose if she has previously received 1 dose. Pregnant females who do not have evidence of immunity should receive the first dose after pregnancy. This first dose should be obtained before leaving the health care facility. The second dose should be obtained 4-8 weeks after the first dose.  Human papillomavirus (HPV) vaccine. Females aged 13-26 years who have not received the vaccine previously should obtain the 3-dose series. The vaccine is not recommended for use in pregnant females. However, pregnancy testing is not needed before receiving a dose. If a female is found to be pregnant after receiving a dose, no treatment is needed. In that case, the remaining doses should be delayed until after the pregnancy. Immunization is recommended for any person with an immunocompromised condition through the age of 61 years if she did not get any or all doses earlier. During the  3-dose series, the second dose should be obtained 4-8 weeks after the first dose. The third dose should be obtained 24 weeks after the first dose and 16 weeks after the second dose.  Zoster vaccine. One dose is recommended for adults aged 30 years or older unless certain conditions are present.  Measles, mumps, and rubella (MMR) vaccine. Adults born  before 1957 generally are considered immune to measles and mumps. Adults born in 1957 or later should have 1 or more doses of MMR vaccine unless there is a contraindication to the vaccine or there is laboratory evidence of immunity to each of the three diseases. A routine second dose of MMR vaccine should be obtained at least 28 days after the first dose for students attending postsecondary schools, health care workers, or international travelers. People who received inactivated measles vaccine or an unknown type of measles vaccine during 1963-1967 should receive 2 doses of MMR vaccine. People who received inactivated mumps vaccine or an unknown type of mumps vaccine before 1979 and are at high risk for mumps infection should consider immunization with 2 doses of MMR vaccine. For females of childbearing age, rubella immunity should be determined. If there is no evidence of immunity, females who are not pregnant should be vaccinated. If there is no evidence of immunity, females who are pregnant should delay immunization until after pregnancy. Unvaccinated health care workers born before 1957 who lack laboratory evidence of measles, mumps, or rubella immunity or laboratory confirmation of disease should consider measles and mumps immunization with 2 doses of MMR vaccine or rubella immunization with 1 dose of MMR vaccine.  Pneumococcal 13-valent conjugate (PCV13) vaccine. When indicated, a person who is uncertain of his immunization history and has no record of immunization should receive the PCV13 vaccine. All adults 65 years of age and older should receive this  vaccine. An adult aged 19 years or older who has certain medical conditions and has not been previously immunized should receive 1 dose of PCV13 vaccine. This PCV13 should be followed with a dose of pneumococcal polysaccharide (PPSV23) vaccine. Adults who are at high risk for pneumococcal disease should obtain the PPSV23 vaccine at least 8 weeks after the dose of PCV13 vaccine. Adults older than 65 years of age who have normal immune system function should obtain the PPSV23 vaccine dose at least 1 year after the dose of PCV13 vaccine.  Pneumococcal polysaccharide (PPSV23) vaccine. When PCV13 is also indicated, PCV13 should be obtained first. All adults aged 65 years and older should be immunized. An adult younger than age 65 years who has certain medical conditions should be immunized. Any person who resides in a nursing home or long-term care facility should be immunized. An adult smoker should be immunized. People with an immunocompromised condition and certain other conditions should receive both PCV13 and PPSV23 vaccines. People with human immunodeficiency virus (HIV) infection should be immunized as soon as possible after diagnosis. Immunization during chemotherapy or radiation therapy should be avoided. Routine use of PPSV23 vaccine is not recommended for American Indians, [place] Natives, or people younger than 65 years unless there are medical conditions that require PPSV23 vaccine. When indicated, people who have unknown immunization and have no record of immunization should receive PPSV23 vaccine. One-time revaccination 5 years after the first dose of PPSV23 is recommended for people aged 19-64 years who have chronic kidney failure, nephrotic syndrome, asplenia, or immunocompromised conditions. People who received 1-2 doses of PPSV23 before age 65 years should receive another dose of PPSV23 vaccine at age 65 years or later if at least 5 years have passed since the previous dose. Doses of PPSV23 are not  needed for people immunized with PPSV23 at or after age 65 years.  Meningococcal vaccine. Adults with asplenia or persistent complement component deficiencies should receive 2 doses of quadrivalent meningococcal conjugate (MenACWY-D) vaccine. The doses should be obtained   at least 2 months apart. Microbiologists working with certain meningococcal bacteria, [employer] recruits, people at risk during an outbreak, and people who travel to or live in countries with a high rate of meningitis should be immunized. A first-year college student up through age 41 years who is living in a residence hall should receive a dose if she did not receive a dose on or after her 16th birthday. Adults who have certain high-risk conditions should receive one or more doses of vaccine.  Hepatitis A vaccine. Adults who wish to be protected from this disease, have certain high-risk conditions, work with hepatitis A-infected animals, work in hepatitis A research labs, or travel to or work in countries with a high rate of hepatitis A should be immunized. Adults who were previously unvaccinated and who anticipate close contact with an international adoptee during the first 60 days after arrival in the [place] from a country with a high rate of hepatitis A should be immunized.  Hepatitis B vaccine. Adults who wish to be protected from this disease, have certain high-risk conditions, may be exposed to blood or other infectious body fluids, are household contacts or sex partners of hepatitis B positive people, are clients or workers in certain care facilities, or travel to or work in countries with a high rate of hepatitis B should be immunized.  Haemophilus influenzae type b (Hib) vaccine. A previously unvaccinated person with asplenia or sickle cell disease or having a scheduled splenectomy should receive 1 dose of Hib vaccine. Regardless of previous immunization, a recipient of a hematopoietic stem cell transplant should receive a  3-dose series 6-12 months after her successful transplant. Hib vaccine is not recommended for adults with HIV infection. Preventive Services / Frequency Ages 61 to 14 years  Blood pressure check.** / Every 3-5 years.  Lipid and cholesterol check.** / Every 5 years beginning at age 73.  Clinical breast exam.** / Every 3 years for women in their 68s and 76s.  BRCA-related cancer risk assessment.** / For women who have family members with a BRCA-related cancer (breast, ovarian, tubal, or peritoneal cancers).  Pap test.** / Every 2 years from ages 86 through 55. Every 3 years starting at age 19 through age 40 or 90 with a history of 3 consecutive normal Pap tests.  HPV screening.** / Every 3 years from ages 31 through ages 11 to 57 with a history of 3 consecutive normal Pap tests.  Hepatitis C blood test.** / For any individual with known risks for hepatitis C.  Skin self-exam. / Monthly.  Influenza vaccine. / Every year.  Tetanus, diphtheria, and acellular pertussis (Tdap, Td) vaccine.** / Consult your health care provider. Pregnant women should receive 1 dose of Tdap vaccine during each pregnancy. 1 dose of Td every 10 years.  Varicella vaccine.** / Consult your health care provider. Pregnant females who do not have evidence of immunity should receive the first dose after pregnancy.  HPV vaccine. / 3 doses over 6 months, if 44 and younger. The vaccine is not recommended for use in pregnant females. However, pregnancy testing is not needed before receiving a dose.  Measles, mumps, rubella (MMR) vaccine.** / You need at least 1 dose of MMR if you were born in 1957 or later. You may also need a 2nd dose. For females of childbearing age, rubella immunity should be determined. If there is no evidence of immunity, females who are not pregnant should be vaccinated. If there is no evidence of immunity, females who are  pregnant should delay immunization until after pregnancy.  Pneumococcal  13-valent conjugate (PCV13) vaccine.** / Consult your health care provider.  Pneumococcal polysaccharide (PPSV23) vaccine.** / 1 to 2 doses if you smoke cigarettes or if you have certain conditions.  Meningococcal vaccine.** / 1 dose if you are age 68 to 8 years and a Market researcher living in a residence hall, or have one of several medical conditions, you need to get vaccinated against meningococcal disease. You may also need additional booster doses.  Hepatitis A vaccine.** / Consult your health care provider.  Hepatitis B vaccine.** / Consult your health care provider.  Haemophilus influenzae type b (Hib) vaccine.** / Consult your health care provider. Ages 7 to 53 years  Blood pressure check.** / Every year.  Lipid and cholesterol check.** / Every 5 years beginning at age 25 years.  Lung cancer screening. / Every year if you are aged 11-80 years and have a 30-pack-year history of smoking and currently smoke or have quit within the past 15 years. Yearly screening is stopped once you have quit smoking for at least 15 years or develop a health problem that would prevent you from having lung cancer treatment.  Clinical breast exam.** / Every year after age 48 years.  BRCA-related cancer risk assessment.** / For women who have family members with a BRCA-related cancer (breast, ovarian, tubal, or peritoneal cancers).  Mammogram.** / Every year beginning at age 41 years and continuing for as long as you are in good health. Consult with your health care provider.  Pap test.** / Every 3 years starting at age 65 years through age 37 or 70 years with a history of 3 consecutive normal Pap tests.  HPV screening.** / Every 3 years from ages 72 years through ages 60 to 40 years with a history of 3 consecutive normal Pap tests.  Fecal occult blood test (FOBT) of stool. / Every year beginning at age 21 years and continuing until age 5 years. You may not need to do this test if you get  a colonoscopy every 10 years.  Flexible sigmoidoscopy or colonoscopy.** / Every 5 years for a flexible sigmoidoscopy or every 10 years for a colonoscopy beginning at age 35 years and continuing until age 48 years.  Hepatitis C blood test.** / For all people born from [birth year] through 1965 and any individual with known risks for hepatitis C.  Skin self-exam. / Monthly.  Influenza vaccine. / Every year.  Tetanus, diphtheria, and acellular pertussis (Tdap/Td) vaccine.** / Consult your health care provider. Pregnant women should receive 1 dose of Tdap vaccine during each pregnancy. 1 dose of Td every 10 years.  Varicella vaccine.** / Consult your health care provider. Pregnant females who do not have evidence of immunity should receive the first dose after pregnancy.  Zoster vaccine.** / 1 dose for adults aged 30 years or older.  Measles, mumps, rubella (MMR) vaccine.** / You need at least 1 dose of MMR if you were born in 1957 or later. You may also need a second dose. For females of childbearing age, rubella immunity should be determined. If there is no evidence of immunity, females who are not pregnant should be vaccinated. If there is no evidence of immunity, females who are pregnant should delay immunization until after pregnancy.  Pneumococcal 13-valent conjugate (PCV13) vaccine.** / Consult your health care provider.  Pneumococcal polysaccharide (PPSV23) vaccine.** / 1 to 2 doses if you smoke cigarettes or if you have certain conditions.  Meningococcal vaccine.** /  Consult your health care provider.  Hepatitis A vaccine.** / Consult your health care provider.  Hepatitis B vaccine.** / Consult your health care provider.  Haemophilus influenzae type b (Hib) vaccine.** / Consult your health care provider. Ages 64 years and over  Blood pressure check.** / Every year.  Lipid and cholesterol check.** / Every 5 years beginning at age 23 years.  Lung cancer screening. / Every year if you  are aged 16-80 years and have a 30-pack-year history of smoking and currently smoke or have quit within the past 15 years. Yearly screening is stopped once you have quit smoking for at least 15 years or develop a health problem that would prevent you from having lung cancer treatment.  Clinical breast exam.** / Every year after age 74 years.  BRCA-related cancer risk assessment.** / For women who have family members with a BRCA-related cancer (breast, ovarian, tubal, or peritoneal cancers).  Mammogram.** / Every year beginning at age 44 years and continuing for as long as you are in good health. Consult with your health care provider.  Pap test.** / Every 3 years starting at age 58 years through age 22 or 39 years with 3 consecutive normal Pap tests. Testing can be stopped between 65 and 70 years with 3 consecutive normal Pap tests and no abnormal Pap or HPV tests in the past 10 years.  HPV screening.** / Every 3 years from ages 64 years through ages 70 or 61 years with a history of 3 consecutive normal Pap tests. Testing can be stopped between 65 and 70 years with 3 consecutive normal Pap tests and no abnormal Pap or HPV tests in the past 10 years.  Fecal occult blood test (FOBT) of stool. / Every year beginning at age 40 years and continuing until age 27 years. You may not need to do this test if you get a colonoscopy every 10 years.  Flexible sigmoidoscopy or colonoscopy.** / Every 5 years for a flexible sigmoidoscopy or every 10 years for a colonoscopy beginning at age 7 years and continuing until age 32 years.  Hepatitis C blood test.** / For all people born from [birth year] through 1965 and any individual with known risks for hepatitis C.  Osteoporosis screening.** / A one-time screening for women ages 30 years and over and women at risk for fractures or osteoporosis.  Skin self-exam. / Monthly.  Influenza vaccine. / Every year.  Tetanus, diphtheria, and acellular pertussis (Tdap/Td)  vaccine.** / 1 dose of Td every 10 years.  Varicella vaccine.** / Consult your health care provider.  Zoster vaccine.** / 1 dose for adults aged 35 years or older.  Pneumococcal 13-valent conjugate (PCV13) vaccine.** / Consult your health care provider.  Pneumococcal polysaccharide (PPSV23) vaccine.** / 1 dose for all adults aged 46 years and older.  Meningococcal vaccine.** / Consult your health care provider.  Hepatitis A vaccine.** / Consult your health care provider.  Hepatitis B vaccine.** / Consult your health care provider.  Haemophilus influenzae type b (Hib) vaccine.** / Consult your health care provider. ** Family history and personal history of risk and conditions may change your health care provider's recommendations.   This information is not intended to replace advice given to you by your health care provider. Make sure you discuss any questions you have with your health care provider.   Document Released: 08/06/2001 Document Revised: 07/01/2014 Document Reviewed: 11/05/2010 Elsevier Interactive Patient Education [employer].

## 2015-04-17 NOTE — Assessment & Plan Note (Signed)
Well controlled, no changes to meds. Encouraged heart healthy diet such as the DASH diet and exercise as tolerated.  °

## 2015-04-17 NOTE — Progress Notes (Signed)
Subjective:    [name], female    DOB: 04/27/1951, 63 y.o.   MRN: [id number]  Chief Complaint  Patient presents with  . Annual Exam    HPI Patient is in today for annual exam. She has struggled with some congestion and notes some wheezing at times. She has been trying to use her CPAP with her wheezing and congestion in his been more difficult. As a general rule she uses her CPAP regularly. She follows with [facility] for her GYN care. She had a colonoscopy with Dr. Collene Mares earlier this month. One benign polyp was found and she was told to repeat in 10 years. No febrile illness. Denies CP/palp/SOB/HA/congestion/fevers/GI or GU c/o. Taking meds as prescribed  Past Medical History  Diagnosis Date  . GERD (gastroesophageal reflux disease)   . Anxiety   . Hyperlipidemia   . Hypertension   . Hx of colonic polyps   . Ventral hernia   . Chicken pox as a child  . Measles as a child  . Mumps as a child  . Obesity   . Vasomotor rhinitis 04/05/2012  . Sleep apnea 04/05/2012  . Elevated LFTs 04/05/2012  . OA (osteoarthritis) of knee 04/05/2012  . Tinea corporis 02/23/2013  . Preventative health care 09/05/2013  . Chronic diastolic CHF (congestive heart failure), NYHA class 1 ([place])   . Complication of anesthesia     pt states feels different in her body after anesthesia when waking up and also experiences a smell of burnt plastic for approx a wk   . PONV (postoperative nausea and vomiting)   . Dysrhythmia     occas palpitations  . Heart murmur     hx of one at birth   . Shortness of breath dyspnea     walking distances / climbing stairs  . Obesity 07/11/2014  . Back pain 10/02/2014  . Anxiety state 09/11/2007    Qualifier: Diagnosis of  By: Scherrie Gerlach    . Kidney stones     cyst on kidney Dr Dorina Hoyer  . Anemia 04/05/2012    Past Surgical History  Procedure Laterality Date  . Cholecystectomy    . Meniscus repair  2009  . Total knee arthroplasty   2011    left  . Knee arthroscopy  dec 2011    right  . Mouth surgery      teeth implants  . Hernia repair  02/08/11    ventral hernia  . Cesarean section      X 3  . Wisdom tooth extraction  2000  . Cardiac catheterization      normal coroary arteries per patient  . Tubal ligation    . Cardiovascular stress test      10/12/2013  . Total knee arthroplasty Right 05/10/2014    Procedure: RIGHT TOTAL KNEE ARTHROPLASTY;  Surgeon: Mauri Pole, MD;  Location: [facility];  Service: Orthopedics;  Laterality: Right;  . Left and right heart catheterization with coronary angiogram N/A 11/08/2013    Procedure: LEFT AND RIGHT HEART CATHETERIZATION WITH CORONARY ANGIOGRAM;  Surgeon: [name], MD;  Location: [facility] CATH LAB;  Service: Cardiovascular;  Laterality: N/A;  . Joint replacement      bilateral  . Colonoscopy with propofol N/A 03/28/2015    Procedure: COLONOSCOPY WITH PROPOFOL;  Surgeon: Juanita Craver, MD;  Location: [facility] ENDOSCOPY;  Service: Endoscopy;  Laterality: N/A;    Family History  Problem Relation Age of Onset  . Thyroid  disease Mother   . Hyperlipidemia Mother   . Heart attack Father 70  . Hypertension Father   . Arthritis Father     RA  . Coronary artery disease Father   . Coronary artery disease Brother   . Heart disease Brother   . Cancer Maternal Aunt     colon  . Cancer Maternal Grandmother     colon  . Heart attack Maternal Grandfather   . Alcohol abuse Paternal Grandfather     Social History   Social History  . Marital Status: Divorced    Spouse Name: N/A  . Number of Children: N/A  . Years of Education: N/A   Occupational History  . Not on file.   Social History Main Topics  . Smoking status: Never Smoker   . Smokeless tobacco: Never Used  . Alcohol Use: No  . Drug Use: No  . Sexual Activity: No     Comment: lives with mother currently-stress   Other Topics Concern  . Not on file   Social History Narrative    Outpatient Prescriptions  Prior to Visit  Medication Sig Dispense Refill  . albuterol (PROVENTIL HFA;VENTOLIN HFA) 108 (90 BASE) MCG/ACT inhaler Inhale 2 puffs into the lungs every 6 (six) hours as needed for wheezing or shortness of breath. 1 Inhaler 0  . diltiazem (CARDIZEM CD) 120 MG 24 hr capsule Take 1 capsule (120 mg total) by mouth daily. (Patient taking differently: Take 120 mg by mouth every morning. ) 90 capsule 3  . escitalopram (LEXAPRO) 20 MG tablet Take 1 tablet (20 mg total) by mouth daily. 90 tablet 3  . fexofenadine (ALLEGRA) 180 MG tablet Take 180 mg by mouth daily.    . fluticasone (FLONASE) 50 MCG/ACT nasal spray Place 1 spray into both nostrils daily as needed for allergies. 16 g 1  . beclomethasone (QVAR) 80 MCG/ACT inhaler Inhale 1 puff into the lungs 2 (two) times daily. 1 Inhaler 0  . cetirizine (ZYRTEC) 10 MG tablet Take 1 tablet (10 mg total) by mouth daily. 90 tablet 3  . diclofenac (VOLTAREN) 75 MG EC tablet Take 1 tablet (75 mg total) by mouth 2 (two) times daily. 180 tablet 1  . furosemide (LASIX) 20 MG tablet Take 1 tablet (20 mg total) by mouth daily. (Patient taking differently: Take 20 mg by mouth every morning. ) 180 tablet 2  . losartan (COZAAR) 100 MG tablet Take 1 tablet (100 mg total) by mouth daily. (Patient taking differently: Take 100 mg by mouth every morning. ) 90 tablet 3  . LOTEMAX 0.5 % GEL INSTILLL 1 DROP INTO BOTH EYES THREE TIMES DAILY  0  . methocarbamol (ROBAXIN) 500 MG tablet Take 1 tablet (500 mg total) by mouth every 8 (eight) hours as needed for muscle spasms. 60 tablet 1  . nebivolol (BYSTOLIC) 10 MG tablet Take 1 tablet (10 mg total) by mouth at bedtime. Take 10 mg by mouth at bedtime. 90 tablet 1  . pantoprazole (PROTONIX) 40 MG tablet Take 1 tablet (40 mg total) by mouth 2 (two) times daily. 180 tablet 3  . Polyethyl Glycol-Propyl Glycol (SYSTANE OP) Apply 1 drop to eye 2 (two) times daily as needed (for dry eyes).    . rosuvastatin (CRESTOR) 20 MG tablet Take 1  tablet by 5 days a week and add an extra 1/2 by mouth twice a week. (Patient taking differently: Take 20 mg by mouth at bedtime. -.) 135 tablet 1  . traMADol (ULTRAM) 50 MG  tablet Take 1 tablet (50 mg total) by mouth every 8 (eight) hours as needed for moderate pain or severe pain. 60 tablet 0  . clotrimazole-betamethasone (LOTRISONE) cream Apply 1 application topically 2 (two) times daily as needed (for rash). (Patient not taking: Reported on 04/17/2015) 30 g 0   No facility-administered medications prior to visit.    No Known Allergies  Review of Systems  Constitutional: Negative for fever, chills and malaise/fatigue.  HENT: Negative for congestion and hearing loss.   Eyes: Negative for discharge.  Respiratory: Positive for shortness of breath and wheezing. Negative for cough and sputum production.   Cardiovascular: Negative for chest pain, palpitations and leg swelling.  Gastrointestinal: Negative for heartburn, nausea, vomiting, abdominal pain, diarrhea, constipation and blood in stool.  Genitourinary: Negative for dysuria, urgency, frequency and hematuria.  Musculoskeletal: Negative for myalgias, back pain and falls.  Skin: Negative for rash.  Neurological: Negative for dizziness, sensory change, loss of consciousness, weakness and headaches.  Endo/Heme/Allergies: Negative for environmental allergies. Does not bruise/bleed easily.  Psychiatric/Behavioral: Negative for depression and suicidal ideas. The patient is not nervous/anxious and does not have insomnia.        Objective:    Physical Exam  Constitutional: She is oriented to person, place, and time. She appears well-developed and well-nourished. No distress.  HENT:  Head: Normocephalic and atraumatic.  Eyes: Conjunctivae are normal.  Neck: Neck supple. No thyromegaly present.  Cardiovascular: Normal rate, regular rhythm and normal heart sounds.   No murmur heard. Pulmonary/Chest: Effort normal and breath sounds normal. No  respiratory distress.  Abdominal: Soft. Bowel sounds are normal. She exhibits no distension and no mass. There is no tenderness.  Musculoskeletal: She exhibits no edema.  Lymphadenopathy:    She has no cervical adenopathy.  Neurological: She is alert and oriented to person, place, and time.  Skin: Skin is warm and dry.  Psychiatric: She has a normal mood and affect. Her behavior is normal.    BP 140/88 mmHg  Pulse 77  Temp(Src) 98.4 F (36.9 C) (Oral)  Ht 5\' 5"  (1.651 m)  Wt 310 lb 2 oz (140.672 kg)  BMI 51.61 kg/m2  SpO2 94% Wt Readings from Last 3 Encounters:  04/17/15 310 lb 2 oz (140.672 kg)  03/28/15 297 lb (134.718 kg)  10/13/14 297 lb (134.718 kg)     Lab Results  Component Value Date   WBC 8.5 04/17/2015   HGB 13.1 04/17/2015   HCT 40.6 04/17/2015   PLT 283.0 04/17/2015   GLUCOSE 79 04/17/2015   CHOL 164 04/17/2015   TRIG 149.0 04/17/2015   HDL 43.90 04/17/2015   LDLDIRECT 84.0 10/13/2014   LDLCALC 90 04/17/2015   ALT 18 04/17/2015   AST 16 04/17/2015   NA 142 04/17/2015   K 4.3 04/17/2015   CL 103 04/17/2015   CREATININE 0.69 04/17/2015   BUN 12 04/17/2015   CO2 26 04/17/2015   TSH 0.89 04/17/2015   INR 1.01 05/03/2014   HGBA1C 6.1 04/17/2015    Lab Results  Component Value Date   TSH 0.89 04/17/2015   Lab Results  Component Value Date   WBC 8.5 04/17/2015   HGB 13.1 04/17/2015   HCT 40.6 04/17/2015   MCV 83.5 04/17/2015   PLT 283.0 04/17/2015   Lab Results  Component Value Date   NA 142 04/17/2015   K 4.3 04/17/2015   CO2 26 04/17/2015   GLUCOSE 79 04/17/2015   BUN 12 04/17/2015   CREATININE 0.69 04/17/2015  BILITOT 0.5 04/17/2015   ALKPHOS 92 04/17/2015   AST 16 04/17/2015   ALT 18 04/17/2015   PROT 7.4 04/17/2015   ALBUMIN 3.9 04/17/2015   CALCIUM 9.8 04/17/2015   ANIONGAP 12 05/11/2014   GFR 90.92 04/17/2015   Lab Results  Component Value Date   CHOL 164 04/17/2015   Lab Results  Component Value Date   HDL 43.90  04/17/2015   Lab Results  Component Value Date   LDLCALC 90 04/17/2015   Lab Results  Component Value Date   TRIG 149.0 04/17/2015   Lab Results  Component Value Date   CHOLHDL 4 04/17/2015   Lab Results  Component Value Date   HGBA1C 6.1 04/17/2015       Assessment & Plan:   Problem List Items Addressed This Visit    Preventative health care    Patient encouraged to maintain heart healthy diet, regular exercise, adequate sleep. Consider daily probiotics. Take medications as prescribed. Labs reviewed.sees GYN for paps      Obstructive sleep apnea    Using CPAP daily now      Obesity    Encouraged DASH diet, decrease po intake and increase exercise as tolerated. Needs 7-8 hours of sleep nightly. Avoid trans fats, eat small, frequent meals every 4-5 hours with lean proteins, complex carbs and healthy fats. Minimize simple carbs, GMO foods.      Hyperlipidemia - Primary   Relevant Medications   nebivolol (BYSTOLIC) 10 MG tablet   losartan (COZAAR) 100 MG tablet   furosemide (LASIX) 20 MG tablet   rosuvastatin (CRESTOR) 20 MG tablet   diclofenac (VOLTAREN) 75 MG EC tablet   Other Relevant Orders   TSH (Completed)   CBC (Completed)   Hemoglobin A1c (Completed)   Lipid panel (Completed)   Comprehensive metabolic panel (Completed)   MM SCREENING BREAST TOMO BILATERAL   Hyperglycemia    hgba1c acceptable, minimize simple carbs. Increase exercise as tolerated.       Relevant Medications   diclofenac (VOLTAREN) 75 MG EC tablet   Other Relevant Orders   TSH (Completed)   CBC (Completed)   Hemoglobin A1c (Completed)   Lipid panel (Completed)   Comprehensive metabolic panel (Completed)   MM SCREENING BREAST TOMO BILATERAL   GERD   Relevant Medications   pantoprazole (PROTONIX) 40 MG tablet   diclofenac (VOLTAREN) 75 MG EC tablet   Other Relevant Orders   TSH (Completed)   CBC (Completed)   Hemoglobin A1c (Completed)   Lipid panel (Completed)   Comprehensive  metabolic panel (Completed)   MM SCREENING BREAST TOMO BILATERAL   Essential hypertension    Well controlled, no changes to meds. Encouraged heart healthy diet such as the DASH diet and exercise as tolerated.       Relevant Medications   nebivolol (BYSTOLIC) 10 MG tablet   losartan (COZAAR) 100 MG tablet   furosemide (LASIX) 20 MG tablet   rosuvastatin (CRESTOR) 20 MG tablet   diclofenac (VOLTAREN) 75 MG EC tablet   Other Relevant Orders   TSH (Completed)   CBC (Completed)   Hemoglobin A1c (Completed)   Lipid panel (Completed)   Comprehensive metabolic panel (Completed)   MM SCREENING BREAST TOMO BILATERAL    Other Visit Diagnoses    Breast cancer screening        Relevant Medications    diclofenac (VOLTAREN) 75 MG EC tablet    Other Relevant Orders    TSH (Completed)    CBC (Completed)  Hemoglobin A1c (Completed)    Lipid panel (Completed)    Comprehensive metabolic panel (Completed)    MM SCREENING BREAST TOMO BILATERAL    Sun-damaged skin        Relevant Orders    Ambulatory referral to Dermatology    Asthma, mild intermittent, uncomplicated        Relevant Medications    beclomethasone (QVAR) 80 MCG/ACT inhaler    montelukast (SINGULAIR) 10 MG tablet    Allergic state, subsequent encounter        Relevant Medications    montelukast (SINGULAIR) 10 MG tablet       I have discontinued [name] cetirizine, Polyethyl Glycol-Propyl Glycol (SYSTANE OP), methocarbamol, traMADol, and LOTEMAX. I have also changed her nebivolol, rosuvastatin, and beclomethasone. Additionally, I am having her start on montelukast. Lastly, I am having her maintain her albuterol, clotrimazole-betamethasone, fluticasone, diltiazem, escitalopram, fexofenadine, losartan, furosemide, pantoprazole, and diclofenac.  Meds ordered this encounter  Medications  . nebivolol (BYSTOLIC) 10 MG tablet    Sig: Take 1 tablet (10 mg total) by mouth at bedtime.    Dispense:  90 tablet    Refill:  3     DISCONTINUE ALL PREVIOUS REFILLS FOR THIS MEDICATION  . losartan (COZAAR) 100 MG tablet    Sig: Take 1 tablet (100 mg total) by mouth daily.    Dispense:  90 tablet    Refill:  3    DISCONTINUE ALL PREVIOUS REFILLS FOR THIS MEDICATION  . furosemide (LASIX) 20 MG tablet    Sig: Take 1 tablet (20 mg total) by mouth daily.    Dispense:  90 tablet    Refill:  3  . rosuvastatin (CRESTOR) 20 MG tablet    Sig: Take 1 tablet (20 mg total) by mouth at bedtime. -.    Dispense:  90 tablet    Refill:  3    D/C PREVIOUS SCRIPTS FOR THIS MEDICATION  . pantoprazole (PROTONIX) 40 MG tablet    Sig: Take 1 tablet (40 mg total) by mouth 2 (two) times daily.    Dispense:  180 tablet    Refill:  3    DISCONTINUE ALL PREVIOUS REFILLS FOR THIS MEDICATION  . diclofenac (VOLTAREN) 75 MG EC tablet    Sig: Take 1 tablet (75 mg total) by mouth 2 (two) times daily.    Dispense:  180 tablet    Refill:  1    DISCONTINUE ALL PREVIOUS REFILLS FOR THIS MEDICATION  . beclomethasone (QVAR) 80 MCG/ACT inhaler    Sig: Inhale 1-2 puffs into the lungs 2 (two) times daily.    Dispense:  1 Inhaler    Refill:  5  . montelukast (SINGULAIR) 10 MG tablet    Sig: Take 1 tablet (10 mg total) by mouth at bedtime.    Dispense:  30 tablet    Refill:  3     Penni Homans, MD

## 2015-04-17 NOTE — Progress Notes (Signed)
Pre visit review using our clinic review tool, if applicable. No additional management support is needed unless otherwise documented below in the visit note. 

## 2015-04-17 NOTE — Assessment & Plan Note (Signed)
Encouraged DASH diet, decrease po intake and increase exercise as tolerated. Needs 7-8 hours of sleep nightly. Avoid trans fats, eat small, frequent meals every 4-5 hours with lean proteins, complex carbs and healthy fats. Minimize simple carbs, GMO foods. 

## 2015-04-18 LAB — LIPID PANEL
CHOL/HDL RATIO: 4
Cholesterol: 164 mg/dL (ref 0–200)
HDL: 43.9 mg/dL (ref >39.00)
LDL CALC: 90 mg/dL (ref 0–99)
NonHDL: 119.92
TRIGLYCERIDES: 149 mg/dL (ref 0.0–149.0)
VLDL: 29.8 mg/dL (ref 0.0–40.0)

## 2015-04-18 LAB — TSH: TSH: 0.89 u[IU]/mL (ref 0.35–4.50)

## 2015-04-18 LAB — COMPREHENSIVE METABOLIC PANEL
ALT: 18 U/L (ref 0–35)
AST: 16 U/L (ref 0–37)
Albumin: 3.9 g/dL (ref 3.5–5.2)
Alkaline Phosphatase: 92 U/L (ref 39–117)
BILIRUBIN TOTAL: 0.5 mg/dL (ref 0.2–1.2)
BUN: 12 mg/dL (ref 6–23)
CALCIUM: 9.8 mg/dL (ref 8.4–10.5)
CHLORIDE: 103 meq/L (ref 96–112)
CO2: 26 meq/L (ref 19–32)
Creatinine, Ser: 0.69 mg/dL (ref 0.40–1.20)
GFR: 90.92 mL/min (ref >60.00)
Glucose, Bld: 79 mg/dL (ref 70–99)
Potassium: 4.3 mEq/L (ref 3.5–5.1)
Sodium: 142 mEq/L (ref 135–145)
Total Protein: 7.4 g/dL (ref 6.0–8.3)

## 2015-04-18 LAB — CBC
HCT: 40.6 % (ref 36.0–46.0)
HEMOGLOBIN: 13.1 g/dL (ref 12.0–15.0)
MCHC: 32.3 g/dL (ref 30.0–36.0)
MCV: 83.5 fl (ref 78.0–100.0)
PLATELETS: 283 10*3/uL (ref 150.0–400.0)
RBC: 4.87 Mil/uL (ref 3.87–5.11)
RDW: 15.5 % (ref 11.5–15.5)
WBC: 8.5 10*3/uL (ref 4.0–10.5)

## 2015-04-18 LAB — HEMOGLOBIN A1C: Hgb A1c MFr Bld: 6.1 % (ref 4.6–6.5)

## 2015-04-24 NOTE — Assessment & Plan Note (Signed)
hgba1c acceptable, minimize simple carbs. Increase exercise as tolerated.  

## 2015-04-24 NOTE — Assessment & Plan Note (Addendum)
Patient encouraged to maintain heart healthy diet, regular exercise, adequate sleep. Consider daily probiotics. Take medications as prescribed. Labs reviewed.sees GYN for paps

## 2015-04-25 ENCOUNTER — Ambulatory Visit (HOSPITAL_BASED_OUTPATIENT_CLINIC_OR_DEPARTMENT_OTHER)
Admission: RE | Admit: 2015-04-25 | Discharge: 2015-04-25 | Disposition: A | Discharge status: Home / Self Care | Payer: 59 | Source: Ambulatory Visit | Attending: Family Medicine | Admitting: Family Medicine

## 2015-04-25 DIAGNOSIS — I10 Essential (primary) hypertension: Secondary | ICD-10-CM

## 2015-04-25 DIAGNOSIS — R739 Hyperglycemia, unspecified: Secondary | ICD-10-CM

## 2015-04-25 DIAGNOSIS — K219 Gastro-esophageal reflux disease without esophagitis: Secondary | ICD-10-CM

## 2015-04-25 DIAGNOSIS — Z1239 Encounter for other screening for malignant neoplasm of breast: Secondary | ICD-10-CM

## 2015-04-25 DIAGNOSIS — Z1231 Encounter for screening mammogram for malignant neoplasm of breast: Secondary | ICD-10-CM | POA: Diagnosis present

## 2015-04-25 DIAGNOSIS — E785 Hyperlipidemia, unspecified: Secondary | ICD-10-CM

## 2015-05-05 ENCOUNTER — Encounter: Payer: Self-pay | Admitting: Family Medicine

## 2015-06-25 HISTORY — PX: BREAST LUMPECTOMY: SHX2

## 2015-06-27 ENCOUNTER — Encounter: Payer: Self-pay | Admitting: Physician Assistant

## 2015-06-27 ENCOUNTER — Ambulatory Visit (INDEPENDENT_AMBULATORY_CARE_PROVIDER_SITE_OTHER): Payer: 59 | Admitting: Physician Assistant

## 2015-06-27 VITALS — BP 148/80 | HR 63 | Temp 98.4°F | Ht 65.0 in | Wt 314.2 lb

## 2015-06-27 DIAGNOSIS — J019 Acute sinusitis, unspecified: Secondary | ICD-10-CM | POA: Diagnosis not present

## 2015-06-27 DIAGNOSIS — R062 Wheezing: Secondary | ICD-10-CM

## 2015-06-27 DIAGNOSIS — B9689 Other specified bacterial agents as the cause of diseases classified elsewhere: Secondary | ICD-10-CM

## 2015-06-27 LAB — POCT RAPID STREP A (OFFICE): Rapid Strep A Screen: NEGATIVE

## 2015-06-27 MED ORDER — METHYLPREDNISOLONE ACETATE 80 MG/ML IJ SUSP
80.0000 mg | Freq: Once | INTRAMUSCULAR | Status: AC
  Administered 2015-06-27: 80 mg via INTRAMUSCULAR

## 2015-06-27 MED ORDER — ALBUTEROL SULFATE HFA 108 (90 BASE) MCG/ACT IN AERS: 2.0000 | INHALATION_SPRAY | Freq: Four times a day (QID) | RESPIRATORY_TRACT | Status: DC | PRN

## 2015-06-27 MED ORDER — AMOXICILLIN-POT CLAVULANATE 875-125 MG PO TABS: 1.0000 | ORAL_TABLET | Freq: Two times a day (BID) | ORAL | Status: DC

## 2015-06-27 MED FILL — AMOX-CLAV 875-125 MG TABLET: 875-125 | 7 days supply | Qty: 14 | Fill #0

## 2015-06-27 MED FILL — VENTOLIN HFA 90 MCG INHALER: 108 (90 BAS | 25 days supply | Qty: 18 | Fill #0

## 2015-06-27 NOTE — Assessment & Plan Note (Signed)
Rapid strep negative. R otitis media noted as well. Rx Augmenting. Depo medrol shot given due to wheezing and tonsillar enlargement. Supportive measures reviewed. Follow-up 1 week.

## 2015-06-27 NOTE — Progress Notes (Signed)
Pre visit review using our clinic review tool, if applicable. No additional management support is needed unless otherwise documented below in the visit note. 

## 2015-06-27 NOTE — Addendum Note (Signed)
Addended by: Harl Bowie on: 06/27/2015 05:09 PM   Modules accepted: Orders

## 2015-06-27 NOTE — Progress Notes (Signed)
Patient presents to clinic today c/o sore throat x 5 days severe in nature associated with headache. Since that time, she has noted nasal congestion and rhinorrhea with PND. Has noted some wheezing over the past couple of days with productive cough. Is using CPAP to help with breathing. Endorses fever at onset of symptoms that has since resolved. Endorses some mild aching.   Past Medical History  Diagnosis Date  . GERD (gastroesophageal reflux disease)   . Anxiety   . Hyperlipidemia   . Hypertension   . Hx of colonic polyps   . Ventral hernia   . Chicken pox as a child  . Measles as a child  . Mumps as a child  . Obesity   . Vasomotor rhinitis 04/05/2012  . Sleep apnea 04/05/2012  . Elevated LFTs 04/05/2012  . OA (osteoarthritis) of knee 04/05/2012  . Tinea corporis 02/23/2013  . Preventative health care 09/05/2013  . Chronic diastolic CHF (congestive heart failure), NYHA class 1 ([place])   . Complication of anesthesia     pt states feels different in her body after anesthesia when waking up and also experiences a smell of burnt plastic for approx a wk   . PONV (postoperative nausea and vomiting)   . Dysrhythmia     occas palpitations  . Heart murmur     hx of one at birth   . Shortness of breath dyspnea     walking distances / climbing stairs  . Obesity 07/11/2014  . Back pain 10/02/2014  . Anxiety state 09/11/2007    Qualifier: Diagnosis of  By: Scherrie Gerlach    . Kidney stones     cyst on kidney Dr Dorina Hoyer  . Anemia 04/05/2012    Current Outpatient Prescriptions on File Prior to Visit  Medication Sig Dispense Refill  . beclomethasone (QVAR) 80 MCG/ACT inhaler Inhale 1-2 puffs into the lungs 2 (two) times daily. 1 Inhaler 5  . clotrimazole-betamethasone (LOTRISONE) cream Apply 1 application topically 2 (two) times daily as needed (for rash). 30 g 0  . diclofenac (VOLTAREN) 75 MG EC tablet Take 1 tablet (75 mg total) by mouth 2 (two) times daily. 180 tablet 1  . diltiazem  (CARDIZEM CD) 120 MG 24 hr capsule Take 1 capsule (120 mg total) by mouth daily. (Patient taking differently: Take 120 mg by mouth every morning. ) 90 capsule 3  . escitalopram (LEXAPRO) 20 MG tablet Take 1 tablet (20 mg total) by mouth daily. 90 tablet 3  . fexofenadine (ALLEGRA) 180 MG tablet Take 180 mg by mouth daily.    . fluticasone (FLONASE) 50 MCG/ACT nasal spray Place 1 spray into both nostrils daily as needed for allergies. 16 g 1  . furosemide (LASIX) 20 MG tablet Take 1 tablet (20 mg total) by mouth daily. 90 tablet 3  . losartan (COZAAR) 100 MG tablet Take 1 tablet (100 mg total) by mouth daily. 90 tablet 3  . montelukast (SINGULAIR) 10 MG tablet Take 1 tablet (10 mg total) by mouth at bedtime. 30 tablet 3  . nebivolol (BYSTOLIC) 10 MG tablet Take 1 tablet (10 mg total) by mouth at bedtime. 90 tablet 3  . pantoprazole (PROTONIX) 40 MG tablet Take 1 tablet (40 mg total) by mouth 2 (two) times daily. 180 tablet 3  . rosuvastatin (CRESTOR) 20 MG tablet Take 1 tablet (20 mg total) by mouth at bedtime. -. 90 tablet 3   No current facility-administered medications on file prior to visit.  No Known Allergies  Family History  Problem Relation Age of Onset  . Thyroid disease Mother   . Hyperlipidemia Mother   . Heart attack Father 24  . Hypertension Father   . Arthritis Father     RA  . Coronary artery disease Father   . Coronary artery disease Brother   . Heart disease Brother   . Cancer Maternal Aunt     colon  . Cancer Maternal Grandmother     colon  . Heart attack Maternal Grandfather   . Alcohol abuse Paternal Grandfather     Social History   Social History  . Marital Status: Divorced    Spouse Name: N/A  . Number of Children: N/A  . Years of Education: N/A   Social History Main Topics  . Smoking status: Never Smoker   . Smokeless tobacco: Never Used  . Alcohol Use: No  . Drug Use: No  . Sexual Activity: No     Comment: lives with mother currently-stress    Other Topics Concern  . None   Social History Narrative   Review of Systems - See HPI.  All other ROS are negative.  BP 148/80 mmHg  Pulse 63  Temp(Src) 98.4 F (36.9 C) (Oral)  Ht 5\' 5"  (1.651 m)  Wt 314 lb 3.2 oz (142.52 kg)  BMI 52.29 kg/m2  SpO2 96%  Physical Exam  Constitutional: She is oriented to person, place, and time and well-developed, well-nourished, and in no distress.  HENT:  Head: Normocephalic and atraumatic.  Right Ear: External ear normal. Tympanic membrane is erythematous and bulging.  Left Ear: Tympanic membrane and external ear normal.  Nose: Nose normal.  Mouth/Throat: Uvula is midline and mucous membranes are normal. Posterior oropharyngeal edema and posterior oropharyngeal erythema present. No oropharyngeal exudate or tonsillar abscesses.  Eyes: Conjunctivae are normal.  Neck: Neck supple.  Cardiovascular: Normal rate, regular rhythm, normal heart sounds and intact distal pulses.   Pulmonary/Chest: Effort normal. No respiratory distress. She has wheezes. She has no rales. She exhibits no tenderness.  Neurological: She is alert and oriented to person, place, and time.  Skin: Skin is warm and dry. No rash noted.  Psychiatric: Affect normal.    Recent Results (from the past 2160 hour(s))  TSH     Status: None   Collection Time: 04/17/15  2:14 PM  Result Value Ref Range   TSH 0.89 0.35 - 4.50 uIU/mL  CBC     Status: None   Collection Time: 04/17/15  2:14 PM  Result Value Ref Range   WBC 8.5 4.0 - 10.5 K/uL   RBC 4.87 3.87 - 5.11 Mil/uL   Platelets 283.0 150.0 - 400.0 K/uL   Hemoglobin 13.1 12.0 - 15.0 g/dL   HCT 40.6 36.0 - 46.0 %   MCV 83.5 78.0 - 100.0 fl   MCHC 32.3 30.0 - 36.0 g/dL   RDW 15.5 11.5 - 15.5 %  Hemoglobin A1c     Status: None   Collection Time: 04/17/15  2:14 PM  Result Value Ref Range   Hgb A1c MFr Bld 6.1 4.6 - 6.5 %    Comment: Glycemic Control Guidelines for People with Diabetes:Non Diabetic:  <6%Goal of Therapy:  <7%Additional Action Suggested:  >8%   Lipid panel     Status: None   Collection Time: 04/17/15  2:14 PM  Result Value Ref Range   Cholesterol 164 0 - 200 mg/dL    Comment: ATP III Classification  Desirable:  < 200 mg/dL               Borderline High:  200 - 239 mg/dL          High:  > = 240 mg/dL   Triglycerides 149.0 0.0 - 149.0 mg/dL    Comment: Normal:  <150 mg/dLBorderline High:  150 - 199 mg/dL   HDL 43.90 >39.00 mg/dL   VLDL 29.8 0.0 - 40.0 mg/dL   LDL Cholesterol 90 0 - 99 mg/dL   Total CHOL/HDL Ratio 4     Comment:                Men          Women1/2 Average Risk     3.4          3.3Average Risk          5.0          4.42X Average Risk          9.6          7.13X Average Risk          15.0          11.0                       NonHDL 119.92     Comment: NOTE:  Non-HDL goal should be 30 mg/dL higher than patient's LDL goal (i.e. LDL goal of < 70 mg/dL, would have non-HDL goal of < 100 mg/dL)  Comprehensive metabolic panel     Status: None   Collection Time: 04/17/15  2:14 PM  Result Value Ref Range   Sodium 142 135 - 145 mEq/L   Potassium 4.3 3.5 - 5.1 mEq/L   Chloride 103 96 - 112 mEq/L   CO2 26 19 - 32 mEq/L   Glucose, Bld 79 70 - 99 mg/dL   BUN 12 6 - 23 mg/dL   Creatinine, Ser 0.69 0.40 - 1.20 mg/dL   Total Bilirubin 0.5 0.2 - 1.2 mg/dL   Alkaline Phosphatase 92 39 - 117 U/L   AST 16 0 - 37 U/L   ALT 18 0 - 35 U/L   Total Protein 7.4 6.0 - 8.3 g/dL   Albumin 3.9 3.5 - 5.2 g/dL   Calcium 9.8 8.4 - 10.5 mg/dL   GFR 90.92 >60.00 mL/min  POCT rapid strep A     Status: Normal   Collection Time: 06/27/15  4:49 PM  Result Value Ref Range   Rapid Strep A Screen Negative Negative    Assessment/Plan: Acute sinusitis with symptoms > 10 days Rapid strep negative. R otitis media noted as well. Rx Augmenting. Depo medrol shot given due to wheezing and tonsillar enlargement. Supportive measures reviewed. Follow-up 1 week.  Wheezing Continue QVar. Restart Albuterol.  Depomedrol shot given to open airways. Follow-up as scheduled.

## 2015-06-27 NOTE — Assessment & Plan Note (Signed)
Continue QVar. Restart Albuterol. Depomedrol shot given to open airways. Follow-up as scheduled.

## 2015-06-27 NOTE — Patient Instructions (Signed)
Please take antibiotic as directed. The steroid shot given today should help breathing and shrink your tonsils. Please get some Mucinex and use twice daily. Tylenol for fever and aches. Resume your Qvar as directed by Dr. Charlett Blake. Do not forget to use your albuterol if needed.  Follow-up 1 week. If anything is worsening, please return to clinic.

## 2015-07-07 ENCOUNTER — Ambulatory Visit (HOSPITAL_BASED_OUTPATIENT_CLINIC_OR_DEPARTMENT_OTHER)
Admission: RE | Admit: 2015-07-07 | Discharge: 2015-07-07 | Disposition: A | Discharge status: Home / Self Care | Payer: 59 | Source: Ambulatory Visit | Attending: Family Medicine | Admitting: Family Medicine

## 2015-07-07 ENCOUNTER — Other Ambulatory Visit: Payer: Self-pay | Admitting: Family Medicine

## 2015-07-07 ENCOUNTER — Ambulatory Visit (INDEPENDENT_AMBULATORY_CARE_PROVIDER_SITE_OTHER): Payer: 59 | Admitting: Family Medicine

## 2015-07-07 ENCOUNTER — Encounter: Payer: Self-pay | Admitting: Family Medicine

## 2015-07-07 VITALS — BP 120/82 | HR 62 | Temp 98.3°F | Ht 66.0 in | Wt 317.1 lb

## 2015-07-07 DIAGNOSIS — J351 Hypertrophy of tonsils: Secondary | ICD-10-CM | POA: Diagnosis not present

## 2015-07-07 DIAGNOSIS — R739 Hyperglycemia, unspecified: Secondary | ICD-10-CM | POA: Diagnosis not present

## 2015-07-07 DIAGNOSIS — R062 Wheezing: Secondary | ICD-10-CM | POA: Insufficient documentation

## 2015-07-07 DIAGNOSIS — I10 Essential (primary) hypertension: Secondary | ICD-10-CM | POA: Diagnosis not present

## 2015-07-07 DIAGNOSIS — R0602 Shortness of breath: Secondary | ICD-10-CM | POA: Diagnosis not present

## 2015-07-07 DIAGNOSIS — J984 Other disorders of lung: Secondary | ICD-10-CM | POA: Diagnosis not present

## 2015-07-07 DIAGNOSIS — G4733 Obstructive sleep apnea (adult) (pediatric): Secondary | ICD-10-CM

## 2015-07-07 DIAGNOSIS — R05 Cough: Secondary | ICD-10-CM | POA: Diagnosis not present

## 2015-07-07 DIAGNOSIS — R918 Other nonspecific abnormal finding of lung field: Secondary | ICD-10-CM | POA: Insufficient documentation

## 2015-07-07 DIAGNOSIS — E785 Hyperlipidemia, unspecified: Secondary | ICD-10-CM

## 2015-07-07 DIAGNOSIS — E669 Obesity, unspecified: Secondary | ICD-10-CM

## 2015-07-07 DIAGNOSIS — R911 Solitary pulmonary nodule: Secondary | ICD-10-CM

## 2015-07-07 MED ORDER — AZITHROMYCIN 250 MG PO TABS: ORAL_TABLET | ORAL | Status: DC

## 2015-07-07 MED FILL — AZITHROMYCIN 250 MG TABLET: 250 | 5 days supply | Qty: 6 | Fill #0

## 2015-07-07 NOTE — Assessment & Plan Note (Signed)
Well controlled, no changes to meds. Encouraged heart healthy diet such as the DASH diet and exercise as tolerated.  °

## 2015-07-07 NOTE — Assessment & Plan Note (Signed)
3 + tonsils b/l with sob and wheezing. Will refer to ENT for further consideration and start a course of azithromycin

## 2015-07-07 NOTE — Patient Instructions (Signed)
Tonsillitis °Tonsillitis is an infection of the throat that causes the tonsils to become red, tender, and swollen. Tonsils are collections of lymphoid tissue at the back of the throat. Each tonsil has crevices (crypts). Tonsils help fight nose and throat infections and keep infection from spreading to other parts of the body for the first 18 months of life.  °CAUSES °Sudden (acute) tonsillitis is usually caused by infection with streptococcal bacteria. Long-lasting (chronic) tonsillitis occurs when the crypts of the tonsils become filled with pieces of food and bacteria, which makes it easy for the tonsils to become repeatedly infected. °SYMPTOMS  °Symptoms of tonsillitis include: °· A sore throat, with possible difficulty swallowing. °· White patches on the tonsils. °· Fever. °· Tiredness. °· New episodes of snoring during sleep, when you did not snore before. °· Small, foul-smelling, yellowish-white pieces of material (tonsilloliths) that you occasionally cough up or spit out. The tonsilloliths can also cause you to have bad breath. °DIAGNOSIS °Tonsillitis can be diagnosed through a physical exam. Diagnosis can be confirmed with the results of lab tests, including a throat culture. °TREATMENT  °The goals of tonsillitis treatment include the reduction of the severity and duration of symptoms and prevention of associated conditions. Symptoms of tonsillitis can be improved with the use of steroids to reduce the swelling. Tonsillitis caused by bacteria can be treated with antibiotic medicines. Usually, treatment with antibiotic medicines is started before the cause of the tonsillitis is known. However, if it is determined that the cause is not bacterial, antibiotic medicines will not treat the tonsillitis. If attacks of tonsillitis are severe and frequent, your health care provider may recommend surgery to remove the tonsils (tonsillectomy). °HOME CARE INSTRUCTIONS  °· Rest as much as possible and get plenty of  sleep. °· Drink plenty of fluids. While the throat is very sore, eat soft foods or liquids, such as sherbet, soups, or instant breakfast drinks. °· Eat frozen ice pops. °· Gargle with a warm or cold liquid to help soothe the throat. Mix 1/4 teaspoon of salt and 1/4 teaspoon of baking soda in 8 oz of water. °SEEK MEDICAL CARE IF:  °· Large, tender lumps develop in your neck. °· A rash develops. °· A green, yellow-brown, or bloody substance is coughed up. °· You are unable to swallow liquids or food for 24 hours. °· You notice that only one of the tonsils is swollen. °SEEK IMMEDIATE MEDICAL CARE IF:  °· You develop any new symptoms such as vomiting, severe headache, stiff neck, chest pain, or trouble breathing or swallowing. °· You have severe throat pain along with drooling or voice changes. °· You have severe pain, unrelieved with recommended medications. °· You are unable to fully open the mouth. °· You develop redness, swelling, or severe pain anywhere in the neck. °· You have a fever. °MAKE SURE YOU:  °· Understand these instructions. °· Will watch your condition. °· Will get help right away if you are not doing well or get worse. °  °This information is not intended to replace advice given to you by your health care provider. Make sure you discuss any questions you have with your health care provider. °  °Document Released: 03/20/2005 Document Revised: 07/01/2014 Document Reviewed: 11/27/2012 °Elsevier Interactive Patient Education ©2016 [employer] ° °

## 2015-07-07 NOTE — Progress Notes (Signed)
Pre visit review using our clinic review tool, if applicable. No additional management support is needed unless otherwise documented below in the visit note. 

## 2015-07-07 NOTE — Progress Notes (Signed)
Subjective:    [name], female    DOB: [birth year], 65 y.o.   MRN: [id number]  Chief Complaint  Patient presents with  . Wheezing  . Shortness of Breath    HPI Patient is in today for follow up on recent SOB, Wheezing and tonsillitis. Is feeling much better but not completely back to baseline. No fevers and chills. No acute concern today. Previously nose was procductive of sphlemg. Marland Kitchen   Past Medical History  Diagnosis Date  . GERD (gastroesophageal reflux disease)   . Anxiety   . Hyperlipidemia   . Hypertension   . Hx of colonic polyps   . Ventral hernia   . Chicken pox as a child  . Measles as a child  . Mumps as a child  . Obesity   . Vasomotor rhinitis 04/05/2012  . Sleep apnea 04/05/2012  . Elevated LFTs 04/05/2012  . OA (osteoarthritis) of knee 04/05/2012  . Tinea corporis 02/23/2013  . Preventative health care 09/05/2013  . Chronic diastolic CHF (congestive heart failure), NYHA class 1 ([place])   . Complication of anesthesia     pt states feels different in her body after anesthesia when waking up and also experiences a smell of burnt plastic for approx a wk   . PONV (postoperative nausea and vomiting)   . Dysrhythmia     occas palpitations  . Heart murmur     hx of one at birth   . Shortness of breath dyspnea     walking distances / climbing stairs  . Obesity 07/11/2014  . Back pain 10/02/2014  . Anxiety state 09/11/2007    Qualifier: Diagnosis of  By: Scherrie Gerlach    . Kidney stones     cyst on kidney Dr Dorina Hoyer  . Anemia 04/05/2012    Past Surgical History  Procedure Laterality Date  . Cholecystectomy    . Meniscus repair  2009  . Total knee arthroplasty  2011    left  . Knee arthroscopy  dec 2011    right  . Mouth surgery      teeth implants  . Hernia repair  02/08/11    ventral hernia  . Cesarean section      X 3  . Wisdom tooth extraction  2000  . Cardiac catheterization      normal coroary arteries per patient  . Tubal  ligation    . Cardiovascular stress test      10/12/2013  . Total knee arthroplasty Right 05/10/2014    Procedure: RIGHT TOTAL KNEE ARTHROPLASTY;  Surgeon: Mauri Pole, MD;  Location: [facility];  Service: Orthopedics;  Laterality: Right;  . Left and right heart catheterization with coronary angiogram N/A 11/08/2013    Procedure: LEFT AND RIGHT HEART CATHETERIZATION WITH CORONARY ANGIOGRAM;  Surgeon: [name], MD;  Location: [facility];  Service: Cardiovascular;  Laterality: N/A;  . Joint replacement      bilateral  . Colonoscopy with propofol N/A 03/28/2015    Procedure: COLONOSCOPY WITH PROPOFOL;  Surgeon: Juanita Craver, MD;  Location: [facility] END[facility]COPY;  Service: Endoscopy;  Laterality: N/A;    Family History  Problem Relation Age of Onset  . Thyroid disease Mother   . Hyperlipidemia Mother   . Heart attack Father 43  . Hypertension Father   . Arthritis Father     RA  . Coronary artery disease Father   . Coronary artery disease Brother   . Heart disease Brother   .  Cancer Maternal Aunt     colon  . Cancer Maternal Grandmother     colon  . Heart attack Maternal Grandfather   . Alcohol abuse Paternal Grandfather     Social History   Social History  . Marital Status: Divorced    Spouse Name: N/A  . Number of Children: N/A  . Years of Education: N/A   Occupational History  . Not on file.   Social History Main Topics  . Smoking status: Never Smoker   . Smokeless tobacco: Never Used  . Alcohol Use: No  . Drug Use: No  . Sexual Activity: No     Comment: lives with mother currently-stress   Other Topics Concern  . Not on file   Social History Narrative    Outpatient Prescriptions Prior to Visit  Medication Sig Dispense Refill  . albuterol (PROVENTIL HFA;VENTOLIN HFA) 108 (90 Base) MCG/ACT inhaler Inhale 2 puffs into the lungs every 6 (six) hours as needed for wheezing or shortness of breath. 1 Inhaler 0  . beclomethasone (QVAR) 80 MCG/ACT inhaler Inhale 1-2  puffs into the lungs 2 (two) times daily. 1 Inhaler 5  . clotrimazole-betamethasone (LOTRISONE) cream Apply 1 application topically 2 (two) times daily as needed (for rash). 30 g 0  . diclofenac (VOLTAREN) 75 MG EC tablet Take 1 tablet (75 mg total) by mouth 2 (two) times daily. 180 tablet 1  . diltiazem (CARDIZEM CD) 120 MG 24 hr capsule Take 1 capsule (120 mg total) by mouth daily. (Patient taking differently: Take 120 mg by mouth every morning. ) 90 capsule 3  . escitalopram (LEXAPRO) 20 MG tablet Take 1 tablet (20 mg total) by mouth daily. 90 tablet 3  . fexofenadine (ALLEGRA) 180 MG tablet Take 180 mg by mouth daily.    . fluticasone (FLONASE) 50 MCG/ACT nasal spray Place 1 spray into both nostrils daily as needed for allergies. 16 g 1  . furosemide (LASIX) 20 MG tablet Take 1 tablet (20 mg total) by mouth daily. 90 tablet 3  . losartan (COZAAR) 100 MG tablet Take 1 tablet (100 mg total) by mouth daily. 90 tablet 3  . montelukast (SINGULAIR) 10 MG tablet Take 1 tablet (10 mg total) by mouth at bedtime. 30 tablet 3  . nebivolol (BYSTOLIC) 10 MG tablet Take 1 tablet (10 mg total) by mouth at bedtime. 90 tablet 3  . pantoprazole (PROTONIX) 40 MG tablet Take 1 tablet (40 mg total) by mouth 2 (two) times daily. 180 tablet 3  . rosuvastatin (CRESTOR) 20 MG tablet Take 1 tablet (20 mg total) by mouth at bedtime. -. 90 tablet 3  . amoxicillin-clavulanate (AUGMENTIN) 875-125 MG tablet Take 1 tablet by mouth 2 (two) times daily. 14 tablet 0   No facility-administered medications prior to visit.    No Known Allergies  Review of Systems  Constitutional: Negative for fever and malaise/fatigue.  HENT: Negative for congestion.   Eyes: Negative for discharge.  Respiratory: Positive for shortness of breath and wheezing.   Cardiovascular: Negative for chest pain, palpitations and leg swelling.  Gastrointestinal: Negative for nausea and abdominal pain.  Genitourinary: Negative for dysuria.    Musculoskeletal: Negative for falls.  Skin: Negative for rash.  Neurological: Negative for loss of consciousness and headaches.  Endo/Heme/Allergies: Negative for environmental allergies.  Psychiatric/Behavioral: Negative for depression. The patient is not nervous/anxious.        Objective:    Physical Exam  Constitutional: She is oriented to person, place, and time. She appears  well-developed and well-nourished. No distress.  HENT:  Head: Normocephalic and atraumatic.  Right Ear: External ear normal.  Left Ear: External ear normal.  Nose: Nose normal.  Mouth/Throat: Oropharynx is clear and moist.  Eyes: Conjunctivae and EOM are normal. Pupils are equal, round, and reactive to light. Right eye exhibits no discharge. Left eye exhibits no discharge.  Neck: Normal range of motion. Neck supple. No JVD present. No thyromegaly present.  Cardiovascular: Normal rate, regular rhythm and intact distal pulses.   No murmur heard. Pulmonary/Chest: Effort normal and breath sounds normal. No respiratory distress. She has no wheezes. She has no rales. She exhibits no tenderness.  Abdominal: Soft. Bowel sounds are normal. She exhibits no distension and no mass. There is no tenderness. There is no rebound and no guarding.  Genitourinary: Vagina normal and uterus normal. Guaiac negative stool. No vaginal discharge found.  Musculoskeletal: Normal range of motion. She exhibits no edema or tenderness.  Lymphadenopathy:    She has no cervical adenopathy.  Neurological: She is alert and oriented to person, place, and time. She has normal reflexes. No cranial nerve deficit.  Skin: Skin is warm and dry. No rash noted. She is not diaphoretic. No erythema.  Psychiatric: She has a normal mood and affect. Her behavior is normal. Judgment and thought content normal.  Nursing note and vitals reviewed.   BP 120/82 mmHg  Pulse 62  Temp(Src) 98.3 F (36.8 C) (Oral)  Ht 5\' 6"  (1.676 m)  Wt 317 lb 2 oz (143.847  kg)  BMI 51.21 kg/m2  SpO2 97% Wt Readings from Last 3 Encounters:  07/07/15 317 lb 2 oz (143.847 kg)  06/27/15 314 lb 3.2 oz (142.52 kg)  04/17/15 310 lb 2 oz (140.672 kg)     Lab Results  Component Value Date   WBC 8.5 04/17/2015   HGB 13.1 04/17/2015   HCT 40.6 04/17/2015   PLT 283.0 04/17/2015   GLUCOSE 79 04/17/2015   CHOL 164 04/17/2015   TRIG 149.0 04/17/2015   HDL 43.90 04/17/2015   LDLDIRECT 84.0 10/13/2014   LDLCALC 90 04/17/2015   ALT 18 04/17/2015   AST 16 04/17/2015   NA 142 04/17/2015   K 4.3 04/17/2015   CL 103 04/17/2015   CREATININE 0.69 04/17/2015   BUN 12 04/17/2015   CO2 26 04/17/2015   TSH 0.89 04/17/2015   INR 1.01 05/03/2014   HGBA1C 6.1 04/17/2015    Lab Results  Component Value Date   TSH 0.89 04/17/2015   Lab Results  Component Value Date   WBC 8.5 04/17/2015   HGB 13.1 04/17/2015   HCT 40.6 04/17/2015   MCV 83.5 04/17/2015   PLT 283.0 04/17/2015   Lab Results  Component Value Date   NA 142 04/17/2015   K 4.3 04/17/2015   CO2 26 04/17/2015   GLUCOSE 79 04/17/2015   BUN 12 04/17/2015   CREATININE 0.69 04/17/2015   BILITOT 0.5 04/17/2015   ALKPHOS 92 04/17/2015   AST 16 04/17/2015   ALT 18 04/17/2015   PROT 7.4 04/17/2015   ALBUMIN 3.9 04/17/2015   CALCIUM 9.8 04/17/2015   ANIONGAP 12 05/11/2014   GFR 90.92 04/17/2015   Lab Results  Component Value Date   CHOL 164 04/17/2015   Lab Results  Component Value Date   HDL 43.90 04/17/2015   Lab Results  Component Value Date   LDLCALC 90 04/17/2015   Lab Results  Component Value Date   TRIG 149.0 04/17/2015   Lab Results  Component Value Date   CHOLHDL 4 04/17/2015   Lab Results  Component Value Date   HGBA1C 6.1 04/17/2015       Assessment & Plan:   Problem List Items Addressed This Visit    Essential hypertension    Well controlled, no changes to meds. Encouraged heart healthy diet such as the DASH diet and exercise as tolerated.       Hyperglycemia       minimize simple carbs. Increase exercise as tolerated.       Hyperlipidemia    Tolerating statin, encouraged heart healthy diet, avoid trans fats, minimize simple carbs and saturated fats. Increase exercise as tolerated      Obesity    Encouraged DASH diet, decrease po intake and increase exercise as tolerated. Needs 7-8 hours of sleep nightly. Avoid trans fats, eat small, frequent meals every 4-5 hours with lean proteins, complex carbs and healthy fats. Minimize simple carbs      Obstructive sleep apnea    Continue routine CPAP use      SOB (shortness of breath) - Primary   Relevant Orders   Ambulatory referral to ENT   DG Chest 2 View (Completed)   Tonsillar hypertrophy    3 + tonsils b/l with sob and wheezing. Will refer to ENT for further consideration and start a course of azithromycin      Relevant Orders   Ambulatory referral to ENT   Wheezing    Concern is it is multifactorial, likely bronchitis. Start Zpak continue Albuterol prn and continue Qvar 80, 2 puffs po bid         I have discontinued Ms. Shippy's amoxicillin-clavulanate. I am also having her start on azithromycin. Additionally, I am having her maintain her clotrimazole-betamethasone, fluticasone, diltiazem, escitalopram, fexofenadine, nebivolol, losartan, furosemide, rosuvastatin, pantoprazole, diclofenac, beclomethasone, montelukast, and albuterol.  Meds ordered this encounter  Medications  . azithromycin (ZITHROMAX) 250 MG tablet    Sig: Take 2 pills the first day and then one tablet daily for the next 4 days.    Dispense:  6 tablet    Refill:  0     Willette Alma, MD

## 2015-07-07 NOTE — Assessment & Plan Note (Signed)
minimize simple carbs. Increase exercise as tolerated.  

## 2015-07-07 NOTE — Assessment & Plan Note (Signed)
Continue routine CPAP use

## 2015-07-07 NOTE — Assessment & Plan Note (Signed)
Concern is it is multifactorial, likely bronchitis. Start Zpak continue Albuterol prn and continue Qvar 80, 2 puffs po bid

## 2015-07-10 ENCOUNTER — Ambulatory Visit (HOSPITAL_BASED_OUTPATIENT_CLINIC_OR_DEPARTMENT_OTHER)
Admission: RE | Admit: 2015-07-10 | Discharge: 2015-07-10 | Disposition: A | Discharge status: Home / Self Care | Payer: 59 | Source: Ambulatory Visit | Attending: Family Medicine | Admitting: Family Medicine

## 2015-07-10 DIAGNOSIS — R911 Solitary pulmonary nodule: Secondary | ICD-10-CM | POA: Insufficient documentation

## 2015-07-10 DIAGNOSIS — R05 Cough: Secondary | ICD-10-CM | POA: Diagnosis not present

## 2015-07-10 DIAGNOSIS — E010 Iodine-deficiency related diffuse (endemic) goiter: Secondary | ICD-10-CM | POA: Diagnosis not present

## 2015-07-10 DIAGNOSIS — R062 Wheezing: Secondary | ICD-10-CM | POA: Insufficient documentation

## 2015-07-16 ENCOUNTER — Encounter: Payer: Self-pay | Admitting: Family Medicine

## 2015-07-16 NOTE — Assessment & Plan Note (Signed)
Encouraged DASH diet, decrease po intake and increase exercise as tolerated. Needs 7-8 hours of sleep nightly. Avoid trans fats, eat small, frequent meals every 4-5 hours with lean proteins, complex carbs and healthy fats. Minimize simple carbs 

## 2015-07-16 NOTE — Assessment & Plan Note (Signed)
Tolerating statin, encouraged heart healthy diet, avoid trans fats, minimize simple carbs and saturated fats. Increase exercise as tolerated 

## 2015-07-19 DIAGNOSIS — K210 Gastro-esophageal reflux disease with esophagitis: Secondary | ICD-10-CM | POA: Diagnosis not present

## 2015-07-28 DIAGNOSIS — G4733 Obstructive sleep apnea (adult) (pediatric): Secondary | ICD-10-CM | POA: Diagnosis not present

## 2015-07-28 DIAGNOSIS — Z96651 Presence of right artificial knee joint: Secondary | ICD-10-CM | POA: Diagnosis not present

## 2015-08-03 MED FILL — DICLOFENAC SOD EC 75 MG TAB: 75 | 90 days supply | Qty: 180 | Fill #1

## 2015-08-03 MED FILL — LOSARTAN POTASSIUM 100 MG T: 100 | 90 days supply | Qty: 90 | Fill #3

## 2015-08-03 MED FILL — ESCITALOPRAM 20 MG TABLET: 20 | 90 days supply | Qty: 90 | Fill #0

## 2015-08-15 ENCOUNTER — Ambulatory Visit: Payer: 59 | Admitting: Family Medicine

## 2015-08-23 ENCOUNTER — Other Ambulatory Visit: Payer: Self-pay | Admitting: Family Medicine

## 2015-08-23 MED FILL — QVAR 80 MCG ORAL INHALER: 80 | 30 days supply | Qty: 9 | Fill #2

## 2015-08-23 MED FILL — AMOXICILLIN 500 MG CAPSULE: 500 | 30 days supply | Qty: 12 | Fill #0

## 2015-08-24 MED FILL — MONTELUKAST SOD 10 MG TAB: 10 | 90 days supply | Qty: 90 | Fill #0

## 2015-08-29 ENCOUNTER — Ambulatory Visit: Payer: 59 | Admitting: Family Medicine

## 2015-08-31 MED FILL — FUROSEMIDE 20 MG TABLET: 20 | 90 days supply | Qty: 90 | Fill #1

## 2015-08-31 MED FILL — CARTIA XT 120 MG CAPSULE: 120 | 90 days supply | Qty: 90 | Fill #3

## 2015-08-31 MED FILL — BYSTOLIC 10 MG TABLET: 10 | 90 days supply | Qty: 90 | Fill #0

## 2015-08-31 MED FILL — ROSUVASTATIN CALCIUM 20 MG: 20 | 90 days supply | Qty: 90 | Fill #1

## 2015-10-16 ENCOUNTER — Encounter: Payer: Self-pay | Admitting: Family Medicine

## 2015-10-16 ENCOUNTER — Ambulatory Visit (INDEPENDENT_AMBULATORY_CARE_PROVIDER_SITE_OTHER): Payer: 59 | Admitting: Family Medicine

## 2015-10-16 VITALS — BP 132/86 | HR 63 | Temp 98.3°F | Ht 66.0 in | Wt 326.5 lb

## 2015-10-16 DIAGNOSIS — F329 Major depressive disorder, single episode, unspecified: Secondary | ICD-10-CM

## 2015-10-16 DIAGNOSIS — R739 Hyperglycemia, unspecified: Secondary | ICD-10-CM

## 2015-10-16 DIAGNOSIS — Z1239 Encounter for other screening for malignant neoplasm of breast: Secondary | ICD-10-CM | POA: Diagnosis not present

## 2015-10-16 DIAGNOSIS — R7989 Other specified abnormal findings of blood chemistry: Secondary | ICD-10-CM

## 2015-10-16 DIAGNOSIS — F32A Depression, unspecified: Secondary | ICD-10-CM

## 2015-10-16 DIAGNOSIS — R062 Wheezing: Secondary | ICD-10-CM

## 2015-10-16 DIAGNOSIS — I10 Essential (primary) hypertension: Secondary | ICD-10-CM

## 2015-10-16 DIAGNOSIS — K219 Gastro-esophageal reflux disease without esophagitis: Secondary | ICD-10-CM | POA: Diagnosis not present

## 2015-10-16 DIAGNOSIS — E785 Hyperlipidemia, unspecified: Secondary | ICD-10-CM

## 2015-10-16 DIAGNOSIS — G4733 Obstructive sleep apnea (adult) (pediatric): Secondary | ICD-10-CM

## 2015-10-16 DIAGNOSIS — R945 Abnormal results of liver function studies: Secondary | ICD-10-CM

## 2015-10-16 DIAGNOSIS — F418 Other specified anxiety disorders: Secondary | ICD-10-CM

## 2015-10-16 DIAGNOSIS — F419 Anxiety disorder, unspecified: Secondary | ICD-10-CM

## 2015-10-16 DIAGNOSIS — E669 Obesity, unspecified: Secondary | ICD-10-CM

## 2015-10-16 LAB — CBC
HCT: 42.5 % (ref 36.0–46.0)
Hemoglobin: 13.7 g/dL (ref 12.0–15.0)
MCHC: 32.3 g/dL (ref 30.0–36.0)
MCV: 85.8 fl (ref 78.0–100.0)
Platelets: 291 10*3/uL (ref 150.0–400.0)
RBC: 4.96 Mil/uL (ref 3.87–5.11)
RDW: 15.7 % — ABNORMAL HIGH (ref 11.5–15.5)
WBC: 9.5 10*3/uL (ref 4.0–10.5)

## 2015-10-16 LAB — LIPID PANEL
CHOL/HDL RATIO: 4
Cholesterol: 172 mg/dL (ref 0–200)
HDL: 38.6 mg/dL — ABNORMAL LOW (ref >39.00)
NonHDL: 133.11
TRIGLYCERIDES: 205 mg/dL — AB (ref 0.0–149.0)
VLDL: 41 mg/dL — ABNORMAL HIGH (ref 0.0–40.0)

## 2015-10-16 LAB — COMPREHENSIVE METABOLIC PANEL
ALT: 26 U/L (ref 0–35)
AST: 21 U/L (ref 0–37)
Albumin: 4 g/dL (ref 3.5–5.2)
Alkaline Phosphatase: 92 U/L (ref 39–117)
BUN: 16 mg/dL (ref 6–23)
CHLORIDE: 100 meq/L (ref 96–112)
CO2: 29 meq/L (ref 19–32)
CREATININE: 0.75 mg/dL (ref 0.40–1.20)
Calcium: 9.5 mg/dL (ref 8.4–10.5)
GFR: 82.45 mL/min (ref >60.00)
Glucose, Bld: 128 mg/dL — ABNORMAL HIGH (ref 70–99)
Potassium: 4.1 mEq/L (ref 3.5–5.1)
SODIUM: 139 meq/L (ref 135–145)
Total Bilirubin: 0.4 mg/dL (ref 0.2–1.2)
Total Protein: 7.5 g/dL (ref 6.0–8.3)

## 2015-10-16 LAB — HEMOGLOBIN A1C: Hgb A1c MFr Bld: 6.5 % (ref 4.6–6.5)

## 2015-10-16 LAB — TSH: TSH: 1.22 u[IU]/mL (ref 0.35–4.50)

## 2015-10-16 LAB — LDL CHOLESTEROL, DIRECT: LDL DIRECT: 102 mg/dL

## 2015-10-16 MED ORDER — DICLOFENAC SODIUM 75 MG PO TBEC: 75.0000 mg | DELAYED_RELEASE_TABLET | Freq: Two times a day (BID) | ORAL | Status: DC

## 2015-10-16 MED ORDER — LOSARTAN POTASSIUM 100 MG PO TABS: 100.0000 mg | ORAL_TABLET | Freq: Every day | ORAL | Status: DC

## 2015-10-16 MED ORDER — BECLOMETHASONE DIPROPIONATE 80 MCG/ACT IN AERS: 1.0000 | INHALATION_SPRAY | Freq: Two times a day (BID) | RESPIRATORY_TRACT | Status: DC

## 2015-10-16 MED ORDER — FLUTICASONE PROPIONATE 50 MCG/ACT NA SUSP: 1.0000 | Freq: Every day | NASAL | Status: DC | PRN

## 2015-10-16 MED ORDER — MONTELUKAST SODIUM 10 MG PO TABS: 10.0000 mg | ORAL_TABLET | Freq: Every day | ORAL | Status: DC

## 2015-10-16 MED ORDER — DILTIAZEM HCL ER COATED BEADS 120 MG PO CP24: 120.0000 mg | ORAL_CAPSULE | Freq: Every day | ORAL | Status: DC

## 2015-10-16 MED ORDER — NEBIVOLOL HCL 10 MG PO TABS: 10.0000 mg | ORAL_TABLET | Freq: Every day | ORAL | Status: DC

## 2015-10-16 MED ORDER — FUROSEMIDE 20 MG PO TABS: 20.0000 mg | ORAL_TABLET | Freq: Every day | ORAL | Status: DC

## 2015-10-16 MED ORDER — ALBUTEROL SULFATE HFA 108 (90 BASE) MCG/ACT IN AERS: 2.0000 | INHALATION_SPRAY | Freq: Four times a day (QID) | RESPIRATORY_TRACT | Status: DC | PRN

## 2015-10-16 MED ORDER — ESCITALOPRAM OXALATE 20 MG PO TABS: 20.0000 mg | ORAL_TABLET | Freq: Every day | ORAL | Status: DC

## 2015-10-16 MED ORDER — ESOMEPRAZOLE MAGNESIUM 40 MG PO CPDR: 40.0000 mg | DELAYED_RELEASE_CAPSULE | Freq: Every day | ORAL | Status: DC

## 2015-10-16 MED FILL — ESOMEPRAZOLE MAG DR 40 MG C: 40 | 90 days supply | Qty: 90 | Fill #0

## 2015-10-16 MED FILL — VENTOLIN HFA 90 MCG INHALER: 108 (90 BAS | 25 days supply | Qty: 18 | Fill #0

## 2015-10-16 MED FILL — QVAR 80 MCG ORAL INHALER: 80 | 30 days supply | Qty: 9 | Fill #0

## 2015-10-16 NOTE — Assessment & Plan Note (Signed)
Check CMP, minimize simple carbs

## 2015-10-16 NOTE — Progress Notes (Signed)
Pre visit review using our clinic review tool, if applicable. No additional management support is needed unless otherwise documented below in the visit note. 

## 2015-10-16 NOTE — Assessment & Plan Note (Signed)
hgba1c acceptable, minimize simple carbs. Increase exercise as tolerated.  

## 2015-10-16 NOTE — Assessment & Plan Note (Signed)
Encouraged DASH diet, decrease po intake and increase exercise as tolerated. Needs 7-8 hours of sleep nightly. Avoid trans fats, eat small, frequent meals every 4-5 hours with lean proteins, complex carbs and healthy fats. Minimize simple carbs, GMO foods. 

## 2015-10-16 NOTE — Patient Instructions (Signed)
Allergic Rhinitis Allergic rhinitis is when the mucous membranes in the nose respond to allergens. Allergens are particles in the air that cause your body to have an allergic reaction. This causes you to release allergic antibodies. Through a chain of events, these eventually cause you to release histamine into the blood stream. Although meant to protect the body, it is this release of histamine that causes your discomfort, such as frequent sneezing, congestion, and an itchy, runny nose.  CAUSES Seasonal allergic rhinitis (hay fever) is caused by pollen allergens that may come from grasses, trees, and weeds. Year-round allergic rhinitis (perennial allergic rhinitis) is caused by allergens such as house dust mites, pet dander, and mold spores. SYMPTOMS  Nasal stuffiness (congestion).  Itchy, runny nose with sneezing and tearing of the eyes. DIAGNOSIS Your health care provider can help you determine the allergen or allergens that trigger your symptoms. If you and your health care provider are unable to determine the allergen, skin or blood testing may be used. Your health care provider will diagnose your condition after taking your health history and performing a physical exam. Your health care provider may assess you for other related conditions, such as asthma, pink eye, or an ear infection. TREATMENT Allergic rhinitis does not have a cure, but it can be controlled by:  Medicines that block allergy symptoms. These may include allergy shots, nasal sprays, and oral antihistamines.  Avoiding the allergen. Hay fever may often be treated with antihistamines in pill or nasal spray forms. Antihistamines block the effects of histamine. There are over-the-counter medicines that may help with nasal congestion and swelling around the eyes. Check with your health care provider before taking or giving this medicine. If avoiding the allergen or the medicine prescribed do not work, there are many new medicines  your health care provider can prescribe. Stronger medicine may be used if initial measures are ineffective. Desensitizing injections can be used if medicine and avoidance does not work. Desensitization is when a patient is given ongoing shots until the body becomes less sensitive to the allergen. Make sure you follow up with your health care provider if problems continue. HOME CARE INSTRUCTIONS It is not possible to completely avoid allergens, but you can reduce your symptoms by taking steps to limit your exposure to them. It helps to know exactly what you are allergic to so that you can avoid your specific triggers. SEEK MEDICAL CARE IF:  You have a fever.  You develop a cough that does not stop easily (persistent).  You have shortness of breath.  You start wheezing.  Symptoms interfere with normal daily activities.   This information is not intended to replace advice given to you by your health care provider. Make sure you discuss any questions you have with your health care provider.   Document Released: 03/05/2001 Document Revised: 07/01/2014 Document Reviewed: 02/15/2013 Elsevier Interactive Patient Education 2016 [employer]  

## 2015-10-16 NOTE — Assessment & Plan Note (Signed)
Well controlled, no changes to meds. Encouraged heart healthy diet such as the DASH diet and exercise as tolerated.  °

## 2015-10-16 NOTE — Assessment & Plan Note (Signed)
Avoid offending foods, start probiotics. Do not eat large meals in late evening and consider raising head of bed. Had to switch from Protonix back to Nexium and is doing better with dyspepsia and wheezing

## 2015-10-16 NOTE — Assessment & Plan Note (Signed)
Has been struggling with myalgias on Crestor will hold for a month and see if she improves. Will try to rechallenge next month and go from there.

## 2015-10-16 NOTE — Assessment & Plan Note (Signed)
Doing much better with switch back to Nexium.

## 2015-10-16 NOTE — Assessment & Plan Note (Signed)
Is doing wellm is retiring soon and is looking forward to more time with her kids

## 2015-10-16 NOTE — Assessment & Plan Note (Signed)
Uses CPAP regularly °

## 2015-10-16 NOTE — Progress Notes (Signed)
[name], female   DOB: [birth year], 65 y.o.   MRN: [id number]   Subjective:    [name], female    DOB: 20-Dec-1950, 65 y.o.   MRN: [id number]  Chief Complaint  Patient presents with  . Follow-up    HPI Patient is in today for follow up. She is struggling with right arm pain, she has had trouble off and on for months but recently pain has become more persistent. She does a great deal of heavy lifting at work but she is preparing to retire. She has completed work up with ENT and they have confirmed the exam was c/w reflux, she does note dyspepsia at times but it is improving with switch back to Nexium. Denies CP/palp/SOB/HA/congestion/fevers or GU c/o. Taking meds as prescribed  Past Medical History  Diagnosis Date  . GERD (gastroesophageal reflux disease)   . Anxiety   . Hyperlipidemia   . Hypertension   . Hx of colonic polyps   . Ventral hernia   . Chicken pox as a child  . Measles as a child  . Mumps as a child  . Obesity   . Vasomotor rhinitis 04/05/2012  . Sleep apnea 04/05/2012  . Elevated LFTs 04/05/2012  . OA (osteoarthritis) of knee 04/05/2012  . Tinea corporis 02/23/2013  . Preventative health care 09/05/2013  . Chronic diastolic CHF (congestive heart failure), NYHA class 1 ([place])   . Complication of anesthesia     pt states feels different in her body after anesthesia when waking up and also experiences a smell of burnt plastic for approx a wk   . PONV (postoperative nausea and vomiting)   . Dysrhythmia     occas palpitations  . Heart murmur     hx of one at birth   . Shortness of breath dyspnea     walking distances / climbing stairs  . Obesity 07/11/2014  . Back pain 10/02/2014  . Anxiety state 09/11/2007    Qualifier: Diagnosis of  By: Scherrie Gerlach    . Kidney stones     cyst on kidney Dr Dorina Hoyer  . Anemia 04/05/2012    Past Surgical History  Procedure Laterality Date  . Cholecystectomy    . Meniscus repair  2009    . Total knee arthroplasty  2011    left  . Knee arthroscopy  dec 2011    right  . Mouth surgery      teeth implants  . Hernia repair  02/08/11    ventral hernia  . Cesarean section      X 3  . Wisdom tooth extraction  2000  . Cardiac catheterization      normal coroary arteries per patient  . Tubal ligation    . Cardiovascular stress test      10/12/2013  . Total knee arthroplasty Right 05/10/2014    Procedure: RIGHT TOTAL KNEE ARTHROPLASTY;  Surgeon: Mauri Pole, MD;  Location: [facility];  Service: Orthopedics;  Laterality: Right;  . Left and right heart catheterization with coronary angiogram N/A 11/08/2013    Procedure: LEFT AND RIGHT HEART CATHETERIZATION WITH CORONARY ANGIOGRAM;  Surgeon: [name], MD;  Location: [facility] CATH LAB;  Service: Cardiovascular;  Laterality: N/A;  . Joint replacement      bilateral  . Colonoscopy with propofol N/A 03/28/2015    Procedure: COLONOSCOPY WITH PROPOFOL;  Surgeon: Juanita Craver, MD;  Location: [facility] ENDOSCOPY;  Service: Endoscopy;  Laterality: N/A;  Family History  Problem Relation Age of Onset  . Thyroid disease Mother   . Hyperlipidemia Mother   . Heart attack Father 60  . Hypertension Father   . Arthritis Father     RA  . Coronary artery disease Father   . Coronary artery disease Brother   . Heart disease Brother   . Cancer Maternal Aunt     colon  . Cancer Maternal Grandmother     colon  . Heart attack Maternal Grandfather   . Alcohol abuse Paternal Grandfather     Social History   Social History  . Marital Status: Divorced    Spouse Name: N/A  . Number of Children: N/A  . Years of Education: N/A   Occupational History  . Not on file.   Social History Main Topics  . Smoking status: Never Smoker   . Smokeless tobacco: Never Used  . Alcohol Use: No  . Drug Use: No  . Sexual Activity: No     Comment: lives with mother currently-stress   Other Topics Concern  . Not on file   Social History Narrative     Outpatient Prescriptions Prior to Visit  Medication Sig Dispense Refill  . clotrimazole-betamethasone (LOTRISONE) cream Apply 1 application topically 2 (two) times daily as needed (for rash). 30 g 0  . rosuvastatin (CRESTOR) 20 MG tablet Take 1 tablet (20 mg total) by mouth at bedtime. -. 90 tablet 3  . albuterol (PROVENTIL HFA;VENTOLIN HFA) 108 (90 Base) MCG/ACT inhaler Inhale 2 puffs into the lungs every 6 (six) hours as needed for wheezing or shortness of breath. 1 Inhaler 0  . beclomethasone (QVAR) 80 MCG/ACT inhaler Inhale 1-2 puffs into the lungs 2 (two) times daily. 1 Inhaler 5  . diclofenac (VOLTAREN) 75 MG EC tablet Take 1 tablet (75 mg total) by mouth 2 (two) times daily. 180 tablet 1  . diltiazem (CARDIZEM CD) 120 MG 24 hr capsule Take 1 capsule (120 mg total) by mouth daily. (Patient taking differently: Take 120 mg by mouth every morning. ) 90 capsule 3  . escitalopram (LEXAPRO) 20 MG tablet Take 1 tablet (20 mg total) by mouth daily. 90 tablet 3  . fluticasone (FLONASE) 50 MCG/ACT nasal spray Place 1 spray into both nostrils daily as needed for allergies. 16 g 1  . furosemide (LASIX) 20 MG tablet Take 1 tablet (20 mg total) by mouth daily. 90 tablet 3  . losartan (COZAAR) 100 MG tablet Take 1 tablet (100 mg total) by mouth daily. 90 tablet 3  . montelukast (SINGULAIR) 10 MG tablet TAKE 1 TABLET BY MOUTH AT BEDTIME. 30 tablet 3  . nebivolol (BYSTOLIC) 10 MG tablet Take 1 tablet (10 mg total) by mouth at bedtime. 90 tablet 3  . azithromycin (ZITHROMAX) 250 MG tablet Take 2 pills the first day and then one tablet daily for the next 4 days. 6 tablet 0  . fexofenadine (ALLEGRA) 180 MG tablet Take 180 mg by mouth daily.    . pantoprazole (PROTONIX) 40 MG tablet Take 1 tablet (40 mg total) by mouth 2 (two) times daily. 180 tablet 3   No facility-administered medications prior to visit.    No Known Allergies  Review of Systems  Constitutional: Positive for malaise/fatigue. Negative  for fever.  HENT: Positive for congestion.   Eyes: Negative for blurred vision.  Respiratory: Positive for wheezing. Negative for shortness of breath.   Cardiovascular: Negative for chest pain, palpitations and leg swelling.  Gastrointestinal: Negative for nausea, abdominal  pain and blood in stool.  Genitourinary: Negative for dysuria and frequency.  Musculoskeletal: Positive for myalgias. Negative for falls.  Skin: Negative for rash.  Neurological: Negative for dizziness, loss of consciousness and headaches.  Endo/Heme/Allergies: Negative for environmental allergies.  Psychiatric/Behavioral: Negative for depression. The patient is not nervous/anxious.        Objective:    Physical Exam  Constitutional: She is oriented to person, place, and time. She appears well-developed and well-nourished. No distress.  HENT:  Head: Normocephalic and atraumatic.  Nose: Nose normal.  Eyes: Right eye exhibits no discharge. Left eye exhibits no discharge.  Neck: Normal range of motion. Neck supple.  Cardiovascular: Normal rate and regular rhythm.   No murmur heard. Pulmonary/Chest: Effort normal and breath sounds normal.  Abdominal: Soft. Bowel sounds are normal. There is no tenderness.  Musculoskeletal: She exhibits no edema.  Neurological: She is alert and oriented to person, place, and time.  Skin: Skin is warm and dry.  Psychiatric: She has a normal mood and affect.  Nursing note and vitals reviewed.   BP 132/86 mmHg  Pulse 63  Temp(Src) 98.3 F (36.8 C) (Oral)  Ht 5\' 6"  (1.676 m)  Wt 326 lb 8 oz (148.099 kg)  BMI 52.72 kg/m2  SpO2 94% Wt Readings from Last 3 Encounters:  10/16/15 326 lb 8 oz (148.099 kg)  07/07/15 317 lb 2 oz (143.847 kg)  06/27/15 314 lb 3.2 oz (142.52 kg)     Lab Results  Component Value Date   WBC 8.5 04/17/2015   HGB 13.1 04/17/2015   HCT 40.6 04/17/2015   PLT 283.0 04/17/2015   GLUCOSE 79 04/17/2015   CHOL 164 04/17/2015   TRIG 149.0 04/17/2015    HDL 43.90 04/17/2015   LDLDIRECT 84.0 10/13/2014   LDLCALC 90 04/17/2015   ALT 18 04/17/2015   AST 16 04/17/2015   NA 142 04/17/2015   K 4.3 04/17/2015   CL 103 04/17/2015   CREATININE 0.69 04/17/2015   BUN 12 04/17/2015   CO2 26 04/17/2015   TSH 0.89 04/17/2015   INR 1.01 05/03/2014   HGBA1C 6.1 04/17/2015    Lab Results  Component Value Date   TSH 0.89 04/17/2015   Lab Results  Component Value Date   WBC 8.5 04/17/2015   HGB 13.1 04/17/2015   HCT 40.6 04/17/2015   MCV 83.5 04/17/2015   PLT 283.0 04/17/2015   Lab Results  Component Value Date   NA 142 04/17/2015   K 4.3 04/17/2015   CO2 26 04/17/2015   GLUCOSE 79 04/17/2015   BUN 12 04/17/2015   CREATININE 0.69 04/17/2015   BILITOT 0.5 04/17/2015   ALKPHOS 92 04/17/2015   AST 16 04/17/2015   ALT 18 04/17/2015   PROT 7.4 04/17/2015   ALBUMIN 3.9 04/17/2015   CALCIUM 9.8 04/17/2015   ANIONGAP 12 05/11/2014   GFR 90.92 04/17/2015   Lab Results  Component Value Date   CHOL 164 04/17/2015   Lab Results  Component Value Date   HDL 43.90 04/17/2015   Lab Results  Component Value Date   LDLCALC 90 04/17/2015   Lab Results  Component Value Date   TRIG 149.0 04/17/2015   Lab Results  Component Value Date   CHOLHDL 4 04/17/2015   Lab Results  Component Value Date   HGBA1C 6.1 04/17/2015       Assessment & Plan:   Problem List Items Addressed This Visit    Anxiety and depression    Is doing  wellm is retiring soon and is looking forward to more time with her kids      Elevated LFTs    Check CMP, minimize simple carbs      Essential hypertension    Well controlled, no changes to meds. Encouraged heart healthy diet such as the DASH diet and exercise as tolerated.       Relevant Medications   nebivolol (BYSTOLIC) 10 MG tablet   losartan (COZAAR) 100 MG tablet   furosemide (LASIX) 20 MG tablet   diltiazem (CARDIZEM CD) 120 MG 24 hr capsule   Other Relevant Orders   TSH   CBC    Comprehensive metabolic panel   GERD    Avoid offending foods, start probiotics. Do not eat large meals in late evening and consider raising head of bed. Had to switch from Protonix back to Nexium and is doing better with dyspepsia and wheezing      Relevant Medications   esomeprazole (NEXIUM) 40 MG capsule   esomeprazole (NEXIUM) 40 MG capsule   Hyperglycemia    hgba1c acceptable, minimize simple carbs. Increase exercise as tolerated.       Relevant Orders   Hemoglobin A1c   Hyperlipidemia - Primary    Has been struggling with myalgias on Crestor will hold for a month and see if she improves. Will try to rechallenge next month and go from there.      Relevant Medications   nebivolol (BYSTOLIC) 10 MG tablet   losartan (COZAAR) 100 MG tablet   furosemide (LASIX) 20 MG tablet   diltiazem (CARDIZEM CD) 120 MG 24 hr capsule   Other Relevant Orders   Lipid panel   Obesity    Encouraged DASH diet, decrease po intake and increase exercise as tolerated. Needs 7-8 hours of sleep nightly. Avoid trans fats, eat small, frequent meals every 4-5 hours with lean proteins, complex carbs and healthy fats. Minimize simple carbs, GMO foods.      Obstructive sleep apnea    Uses CPAP regularly      Wheezing    Doing much better with switch back to Nexium.       Other Visit Diagnoses    Breast cancer screening           I have discontinued Ms. Killilea fexofenadine, pantoprazole, and azithromycin. I have also changed her montelukast. Additionally, I am having her start on esomeprazole. Lastly, I am having her maintain her clotrimazole-betamethasone, rosuvastatin, cetirizine, esomeprazole, nebivolol, losartan, furosemide, fluticasone, escitalopram, diltiazem, diclofenac, beclomethasone, and albuterol.  Meds ordered this encounter  Medications  . cetirizine (ZYRTEC) 10 MG tablet    Sig: Take 10 mg by mouth daily.  Marland Kitchen esomeprazole (NEXIUM) 40 MG capsule    Sig: Take 40 mg by mouth daily at  12 noon.  . nebivolol (BYSTOLIC) 10 MG tablet    Sig: Take 1 tablet (10 mg total) by mouth at bedtime.    Dispense:  90 tablet    Refill:  1    DISCONTINUE ALL PREVIOUS REFILLS FOR THIS MEDICATION  . montelukast (SINGULAIR) 10 MG tablet    Sig: Take 1 tablet (10 mg total) by mouth at bedtime.    Dispense:  90 tablet    Refill:  1  . losartan (COZAAR) 100 MG tablet    Sig: Take 1 tablet (100 mg total) by mouth daily.    Dispense:  90 tablet    Refill:  1    DISCONTINUE ALL PREVIOUS REFILLS FOR THIS MEDICATION  . furosemide (  LASIX) 20 MG tablet    Sig: Take 1 tablet (20 mg total) by mouth daily.    Dispense:  90 tablet    Refill:  1  . fluticasone (FLONASE) 50 MCG/ACT nasal spray    Sig: Place 1 spray into both nostrils daily as needed for allergies.    Dispense:  48 g    Refill:  1  . escitalopram (LEXAPRO) 20 MG tablet    Sig: Take 1 tablet (20 mg total) by mouth daily.    Dispense:  90 tablet    Refill:  3    DISCONTINUE ALL PREVIOUS REFILLS FOR THIS MEDICATION  . diltiazem (CARDIZEM CD) 120 MG 24 hr capsule    Sig: Take 1 capsule (120 mg total) by mouth daily.    Dispense:  90 capsule    Refill:  1    DISCONTINUE ALL PREVIOUS REFILLS FOR THIS MEDICATION  . diclofenac (VOLTAREN) 75 MG EC tablet    Sig: Take 1 tablet (75 mg total) by mouth 2 (two) times daily.    Dispense:  180 tablet    Refill:  1    DISCONTINUE ALL PREVIOUS REFILLS FOR THIS MEDICATION  . beclomethasone (QVAR) 80 MCG/ACT inhaler    Sig: Inhale 1-2 puffs into the lungs 2 (two) times daily.    Dispense:  1 Inhaler    Refill:  5  . albuterol (PROVENTIL HFA;VENTOLIN HFA) 108 (90 Base) MCG/ACT inhaler    Sig: Inhale 2 puffs into the lungs every 6 (six) hours as needed for wheezing or shortness of breath.    Dispense:  1 Inhaler    Refill:  6  . esomeprazole (NEXIUM) 40 MG capsule    Sig: Take 1 capsule (40 mg total) by mouth daily.    Dispense:  90 capsule    Refill:  1     Leemon Ayala, MD

## 2015-11-06 MED FILL — DICLOFENAC SOD EC 75 MG TAB: 75 | 90 days supply | Qty: 180 | Fill #0

## 2015-11-06 MED FILL — LOSARTAN POTASSIUM 100 MG T: 100 | 90 days supply | Qty: 90 | Fill #0

## 2015-11-15 ENCOUNTER — Encounter: Payer: Self-pay | Admitting: Medical

## 2015-11-15 ENCOUNTER — Ambulatory Visit (INDEPENDENT_AMBULATORY_CARE_PROVIDER_SITE_OTHER): Payer: 59 | Admitting: Medical

## 2015-11-15 VITALS — BP 128/86 | HR 69 | Temp 98.1°F | Ht 65.0 in | Wt 324.2 lb

## 2015-11-15 DIAGNOSIS — H8110 Benign paroxysmal vertigo, unspecified ear: Secondary | ICD-10-CM

## 2015-11-15 NOTE — Progress Notes (Signed)
Subjective:    [name], female    DOB: 06/26/1950, 65 y.o.   MRN: [id number]  HPI  Pt in today describing some vertigo for about one week. Pt states she is using her meclizine. It does not seem to help much. Pt feels like her head is spinning. Looking to left seems to have spinning sensation. Turning her head to left as well. Pt states she has meclizine on hand for history of motion sickness. Last Tuesday night also mentioned turned over in bed and had spinning sensation.  No association of vertigo with ha, vision changes, no slurred speech, extremity weakness or any gross motor or sensory function deficits.  Pt bp is good today.  No history of strokes. No chest pain. No gross motor or sensory functin deficits on review. Seee ros.  Overall new bout of vertigo little worse than usual since last Tuesday.  Vertigo is transient after head maneuver turning left. Then resolves usually in about 2 minutes.   Review of Systems  Constitutional: Negative for fever, chills, activity change and fatigue.  Respiratory: Negative for cough, chest tightness and shortness of breath.   Cardiovascular: Negative for chest pain, palpitations and leg swelling.  Gastrointestinal: Negative for nausea, vomiting and abdominal pain.  Musculoskeletal: Negative for neck pain and neck stiffness.  Skin: Negative for rash.  Neurological: Positive for dizziness. Negative for syncope, facial asymmetry, speech difficulty, weakness, light-headedness, numbness and headaches.  Psychiatric/Behavioral: Negative for behavioral problems, confusion and agitation. The patient is not nervous/anxious.     Past Medical History  Diagnosis Date  . GERD (gastroesophageal reflux disease)   . Anxiety   . Hyperlipidemia   . Hypertension   . Hx of colonic polyps   . Ventral hernia   . Chicken pox as a child  . Measles as a child  . Mumps as a child  . Obesity   . Vasomotor rhinitis 04/05/2012  . Sleep  apnea 04/05/2012  . Elevated LFTs 04/05/2012  . OA (osteoarthritis) of knee 04/05/2012  . Tinea corporis 02/23/2013  . Preventative health care 09/05/2013  . Chronic diastolic CHF (congestive heart failure), NYHA class 1 ([place])   . Complication of anesthesia     pt states feels different in her body after anesthesia when waking up and also experiences a smell of burnt plastic for approx a wk   . PONV (postoperative nausea and vomiting)   . Dysrhythmia     occas palpitations  . Heart murmur     hx of one at birth   . Shortness of breath dyspnea     walking distances / climbing stairs  . Obesity 07/11/2014  . Back pain 10/02/2014  . Anxiety state 09/11/2007    Qualifier: Diagnosis of  By: Scherrie Gerlach    . Kidney stones     cyst on kidney Dr Dorina Hoyer  . Anemia 04/05/2012     Social History   Social History  . Marital Status: Divorced    Spouse Name: N/A  . Number of Children: N/A  . Years of Education: N/A   Occupational History  . Not on file.   Social History Main Topics  . Smoking status: Never Smoker   . Smokeless tobacco: Never Used  . Alcohol Use: No  . Drug Use: No  . Sexual Activity: No     Comment: lives with mother currently-stress   Other Topics Concern  . Not on file   Social History Narrative  Past Surgical History  Procedure Laterality Date  . Cholecystectomy    . Meniscus repair  2009  . Total knee arthroplasty  2011    left  . Knee arthroscopy  dec 2011    right  . Mouth surgery      teeth implants  . Hernia repair  02/08/11    ventral hernia  . Cesarean section      X 3  . Wisdom tooth extraction  2000  . Cardiac catheterization      normal coroary arteries per patient  . Tubal ligation    . Cardiovascular stress test      10/12/2013  . Total knee arthroplasty Right 05/10/2014    Procedure: RIGHT TOTAL KNEE ARTHROPLASTY;  Surgeon: Mauri Pole, MD;  Location: [facility];  Service: Orthopedics;  Laterality: Right;  . Left and right heart  catheterization with coronary angiogram N/A 11/08/2013    Procedure: LEFT AND RIGHT HEART CATHETERIZATION WITH CORONARY ANGIOGRAM;  Surgeon: [name], MD;  Location: [facility] CATH LAB;  Service: Cardiovascular;  Laterality: N/A;  . Joint replacement      bilateral  . Colonoscopy with propofol N/A 03/28/2015    Procedure: COLONOSCOPY WITH PROPOFOL;  Surgeon: Juanita Craver, MD;  Location: [facility] ENDOS[facility];  Service: Endoscopy;  Laterality: N/A;    Family History  Problem Relation Age of Onset  . Thyroid disease Mother   . Hyperlipidemia Mother   . Heart attack Father 55  . Hypertension Father   . Arthritis Father     RA  . Coronary artery disease Father   . Coronary artery disease Brother   . Heart disease Brother   . Cancer Maternal Aunt     colon  . Cancer Maternal Grandmother     colon  . Heart attack Maternal Grandfather   . Alcohol abuse Paternal Grandfather     No Known Allergies  Current Outpatient Prescriptions on File Prior to Visit  Medication Sig Dispense Refill  . albuterol (PROVENTIL HFA;VENTOLIN HFA) 108 (90 Base) MCG/ACT inhaler Inhale 2 puffs into the lungs every 6 (six) hours as needed for wheezing or shortness of breath. 1 Inhaler 6  . beclomethasone (QVAR) 80 MCG/ACT inhaler Inhale 1-2 puffs into the lungs 2 (two) times daily. 1 Inhaler 5  . cetirizine (ZYRTEC) 10 MG tablet Take 10 mg by mouth daily.    . clotrimazole-betamethasone (LOTRISONE) cream Apply 1 application topically 2 (two) times daily as needed (for rash). 30 g 0  . diclofenac (VOLTAREN) 75 MG EC tablet Take 1 tablet (75 mg total) by mouth 2 (two) times daily. 180 tablet 1  . diltiazem (CARDIZEM CD) 120 MG 24 hr capsule Take 1 capsule (120 mg total) by mouth daily. 90 capsule 1  . esomeprazole (NEXIUM) 40 MG capsule Take 1 capsule (40 mg total) by mouth daily. 90 capsule 1  . fluticasone (FLONASE) 50 MCG/ACT nasal spray Place 1 spray into both nostrils daily as needed for allergies. 48 g 1  .  furosemide (LASIX) 20 MG tablet Take 1 tablet (20 mg total) by mouth daily. 90 tablet 1  . losartan (COZAAR) 100 MG tablet Take 1 tablet (100 mg total) by mouth daily. 90 tablet 1  . montelukast (SINGULAIR) 10 MG tablet Take 1 tablet (10 mg total) by mouth at bedtime. 90 tablet 1  . nebivolol (BYSTOLIC) 10 MG tablet Take 1 tablet (10 mg total) by mouth at bedtime. 90 tablet 1  . rosuvastatin (CRESTOR) 20 MG tablet Take 1  tablet (20 mg total) by mouth at bedtime. -. 90 tablet 3   No current facility-administered medications on file prior to visit.    BP 128/86 mmHg  Pulse 69  Temp(Src) 98.1 F (36.7 C) (Oral)  Ht 5\' 5"  (1.651 m)  Wt 324 lb 3.2 oz (147.056 kg)  BMI 53.95 kg/m2  SpO2 98%       Objective:   Physical Exam  General Mental Status- Alert. General Appearance- Not in acute distress.   Skin General: Color- Normal Color. Moisture- Normal Moisture.  Neck Carotid Arteries- Normal color. Moisture- Normal Moisture. No carotid bruits. No JVD.  Chest and Lung Exam Auscultation: Breath Sounds:-Normal.  Cardiovascular Auscultation:Rythm- Regular. Murmurs & Other Heart Sounds:Auscultation of the heart reveals- No Murmurs.  Abdomen Inspection:-Inspeection Normal. Palpation/Percussion:Note:No mass. Palpation and Percussion of the abdomen reveal- Non Tender, Non Distended + BS, no rebound or guarding.    Neurologic Cranial Nerve exam:- CN III-XII intact(No nystagmus), symmetric smile. Drift Test:- No drift. Romberg Exam:- Negative.  Heal to Toe Gait exam:-for the steps did fine then gait looked shaky. Finger to Nose:- Normal/Intact Strength:- 5/5 equal and symmetric strength both upper and lower extremities. On standing after sitting some vertigo. Head movement to the left and eye movement to left cause vertigo.      Assessment & Plan:  For your recent vertigo on position change and head turning will refer you to PT.  I want you to continue meclizine.  If  during the interim you get neurologic type signs and symptoms as we discussed today then go to ED immediately.  Follow up in 7-10 days or as needed.(after therapy you could call and update [name] on how you are doing)  Marvie Brevik, Percell Miller, [employer]

## 2015-11-15 NOTE — Progress Notes (Signed)
Pre visit review using our clinic review tool, if applicable. No additional management support is needed unless otherwise documented below in the visit note. 

## 2015-11-15 NOTE — Patient Instructions (Signed)
For your recent vertigo on position change and head turning will refer you to PT.  I want you to continue meclizine.  If during the interim you get neurologic type signs and symptoms as we discussed today then go to ED immediately.  Follow up in 7-10 days or as needed.(after therapy you could call and update [name] on how you are doing)

## 2015-11-24 MED FILL — MONTELUKAST SOD 10 MG TAB: 10 | 90 days supply | Qty: 90 | Fill #0

## 2015-11-24 MED FILL — ESCITALOPRAM 20 MG TABLET: 20 | 90 days supply | Qty: 90 | Fill #0

## 2015-11-24 MED FILL — FUROSEMIDE 20 MG TABLET: 20 | 90 days supply | Qty: 90 | Fill #2

## 2015-11-24 MED FILL — BYSTOLIC 10 MG TABLET: 10 | 90 days supply | Qty: 90 | Fill #1

## 2015-11-24 MED FILL — CARTIA XT 120 MG CAPSULE SA: 120 | 90 days supply | Qty: 90 | Fill #0

## 2015-11-28 ENCOUNTER — Ambulatory Visit: Payer: 59 | Attending: Medical | Admitting: Physical Therapy

## 2015-11-28 ENCOUNTER — Encounter: Payer: Self-pay | Admitting: Physical Therapy

## 2015-11-28 DIAGNOSIS — H8111 Benign paroxysmal vertigo, right ear: Secondary | ICD-10-CM | POA: Insufficient documentation

## 2015-11-28 DIAGNOSIS — R42 Dizziness and giddiness: Secondary | ICD-10-CM | POA: Diagnosis not present

## 2015-11-28 DIAGNOSIS — H8112 Benign paroxysmal vertigo, left ear: Secondary | ICD-10-CM | POA: Diagnosis not present

## 2015-11-28 NOTE — Therapy (Signed)
[place] [address] [place], [place], [address] Phone: [phone]   Fax:  [phone]  Physical Therapy Evaluation  Patient Details  Name: [name] MRN: [id number] Date of Birth: 1951/04/27 Referring Provider: Mackie Pai, PA-C  Encounter Date: 11/28/2015      PT End of Session - 11/28/15 1117    Visit Number 1   Number of Visits 9  eval + 8 visits   Date for PT Re-Evaluation 01/12/16   Authorization Type UMR   Authorization Time Period G Codes required   PT Start Time 1021   PT Stop Time 1100   PT Time Calculation (min) 39 min   Activity Tolerance Patient tolerated treatment well   Behavior During Therapy [facility] for tasks assessed/performed      Past Medical History  Diagnosis Date  . GERD (gastroesophageal reflux disease)   . Anxiety   . Hyperlipidemia   . Hypertension   . Hx of colonic polyps   . Ventral hernia   . Chicken pox as a child  . Measles as a child  . Mumps as a child  . Obesity   . Vasomotor rhinitis 04/05/2012  . Sleep apnea 04/05/2012  . Elevated LFTs 04/05/2012  . OA (osteoarthritis) of knee 04/05/2012  . Tinea corporis 02/23/2013  . Preventative health care 09/05/2013  . Chronic diastolic CHF (congestive heart failure), NYHA class 1 ([place])   . Complication of anesthesia     pt states feels different in her body after anesthesia when waking up and also experiences a smell of burnt plastic for approx a wk   . PONV (postoperative nausea and vomiting)   . Dysrhythmia     occas palpitations  . Heart murmur     hx of one at birth   . Shortness of breath dyspnea     walking distances / climbing stairs  . Obesity 07/11/2014  . Back pain 10/02/2014  . Anxiety state 09/11/2007    Qualifier: Diagnosis of  By: Scherrie Gerlach    . Kidney stones     cyst on kidney Dr Dorina Hoyer  . Anemia 04/05/2012    Past Surgical History  Procedure Laterality Date  . Cholecystectomy    .  Meniscus repair  2009  . Total knee arthroplasty  2011    left  . Knee arthroscopy  dec 2011    right  . Mouth surgery      teeth implants  . Hernia repair  02/08/11    ventral hernia  . Cesarean section      X 3  . Wisdom tooth extraction  2000  . Cardiac catheterization      normal coroary arteries per patient  . Tubal ligation    . Cardiovascular stress test      10/12/2013  . Total knee arthroplasty Right 05/10/2014    Procedure: RIGHT TOTAL KNEE ARTHROPLASTY;  Surgeon: Mauri Pole, MD;  Location: [facility];  Service: Orthopedics;  Laterality: Right;  . Left and right heart catheterization with coronary angiogram N/A 11/08/2013    Procedure: LEFT AND RIGHT HEART CATHETERIZATION WITH CORONARY ANGIOGRAM;  Surgeon: [name], MD;  Location: [facility] CATH LAB;  Service: Cardiovascular;  Laterality: N/A;  . Joint replacement      bilateral  . Colonoscopy with propofol N/A 03/28/2015    Procedure: COLONOSCOPY WITH PROPOFOL;  Surgeon: Juanita Craver, MD;  Location: [facility];  Service: Endoscopy;  Laterality: N/A;    There were  no vitals filed for this visit.       Subjective Assessment - 11/28/15 1027    Subjective Vertigo began approx. 1 month ago when pt turned over in bed. Pt reports "heavy feelings of nausea and the room started spinning." Pt saw PA; prescribed more meclizine after office exam. Pt reports dizziness has gotten a lot better in recent 1-2 weeks. Pt has not taken meclizine in a few days.    Pertinent History PMG significant for: HTN, HLD, CHF (per chart, but pt unaware), OSA with CPAP, B knee arthroscopy, B TKR, cardiac catheterization   Patient Stated Goals "I want to find out the steps to fix this vertigo if it comes back."   Currently in Pain? No/denies            [facility] PT Assessment - 11/28/15 0001    Assessment   Medical Diagnosis Benign paroxysmal positional vertigo, unspecified laterality   Referring Provider Mackie Pai, PA-C   Onset  Date/Surgical Date 10/28/15   Precautions   Precautions None   Restrictions   Weight Bearing Restrictions No   Balance Screen   Has the patient fallen in the past 6 months No   Has the patient had a decrease in activity level because of a fear of falling?  No   Is the patient reluctant to leave their home because of a fear of falling?  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   Cognition   Overall Cognitive Status Within Functional Limits for tasks assessed            Vestibular Assessment - 11/28/15 0001    Symptom Behavior   Type of Dizziness Spinning   Frequency of Dizziness daily   Duration of Dizziness < 1 minute   Aggravating Factors Turning body quickly;Turning head quickly;Rolling to left   Relieving Factors Head stationary   Occulomotor Exam   Occulomotor Alignment Normal   Spontaneous Absent   Gaze-induced Right beating nystagmus with R gaze  and symptomatic   Smooth Pursuits Intact   Comment (+) and symptomatic R Head Thrust.   Vestibulo-Occular Reflex   VOR 1 Head Only (x 1 viewing) Visual target blurred with horizontal head turns   Comment Convergence appears WNL.   Positional Testing   Dix-Hallpike Dix-Hallpike Left   Sidelying Test --   Horizontal Canal Testing Horizontal Canal Right;Horizontal Canal Left;Horizontal Canal Right Intensity;Horizontal Canal Left Intensity   Dix-Hallpike Left   Dix-Hallpike Left Duration Approx. 45 seconds   Dix-Hallpike Left Symptoms Upbeat, left rotatory nystagmus   Horizontal Canal Right   Horizontal Canal Right Duration Approx 5 seconds   Horizontal Canal Right Symptoms Geotrophic;Nystagmus;Other (comment)   Horizontal Canal Left   Horizontal Canal Left Duration Approx 10-15 seconds   Horizontal Canal Left  Symptoms Geotrophic;Nystagmus;Other (comment)  appears L torsional with gaze in directon of fast component   Horizontal Canal Right Intensity   Horizontal Canal Right Intensity Mild   Horizontal Canal Left Intensity   Horizontal Canal Left Intensity Moderate                Vestibular Treatment/Exercise - 11/28/15 0001    Vestibular Treatment/Exercise   Vestibular Treatment Provided Canalith Repositioning   Canalith Repositioning Epley Manuever Left    EPLEY MANUEVER LEFT   Number of Reps  2   Overall Response  Improved Symptoms    RESPONSE DETAILS LEFT Reassessment of L Dix-Hallpike revealed decreased duration of nystagmus to approx. 30 seconds with less severe vertigo, per pt. Did not reassess after second Epley maneuver due to time constraint.               PT Education - 11/28/15 1251    Education provided Yes   Education Details PT eval findings, goals, and POC. Educated on BPPV and what to expect after this session.    Person(s) Educated Patient   Methods Explanation   Comprehension Verbalized understanding          PT Short Term Goals - 11/28/15 1252    PT SHORT TERM GOAL #1   Title STG's = LTG's           PT Long Term Goals - 11/28/15 1253    PT LONG TERM GOAL #1   Title Positional vertigo testing will be negative to indicate resolved BPPV.   (Target date: 12/29/15)   PT LONG TERM GOAL #2   Title Pt will independently perform vestibular HEP to maximize functional gains made in PT.    PT LONG TERM GOAL #3   Title Pt will decrease DHI score from 74 to < / =  56 to indicate significant decrease in pt-perceived disability due to dizziness.    PT LONG TERM GOAL #4   Title Assess strength and balance, if indicated, after vertigo clears.                Plan - 11/28/15 1302    Clinical Impression Statement Pt is a 65 y/o F referred to vestibular PT to address vertigo. PMH significant for: HTN, HLD, CHF (per chart, but pt unaware), OSA with CPAP,  B knee arthroscopy, B TKR, cardiac catheterization. PT evaluation reveals the following: (+) and symptomatic R Head Thrust Test; R gaze-evoked nystagmus; impaired VOR; L Dix-Hallpike with L upbeating torsional nystagmus x45 seconds; horizontal canal testing with geotropic nystagmus (intensity of symptoms, nystagmus on L > R). Based on findings, unable to rule out L posterior canalithasis, L horizontal canalithasis, and R vestibular hypofunction. Therefore, treated with L Epley x2 trials with improved symptoms. Pt will continue to benefit from skilled outpatient PT 2x/week for 4 weeks to address said impairments.    Rehab Potential Good   Clinical Impairments Affecting Rehab Potential Positive: pt motivation and compliance; Negative: unable to rule out multi-canal BPPV on evaluation; complex PMH   PT Frequency 2x / week   PT Duration 4 weeks   PT Treatment/Interventions Canalith Repostioning;ADLs/Self Care Home Management;Balance training;Therapeutic exercise;Therapeutic activities;Patient/family education;Functional mobility training;Neuromuscular re-education;Stair training;Gait training;Vestibular   Consulted and Agree with Plan of Care Patient      Patient will benefit from skilled therapeutic intervention in order to improve the following deficits and impairments:  Dizziness  Visit Diagnosis: BPPV (benign paroxysmal positional vertigo), left - Plan: PT plan of care cert/re-cert  Dizziness and giddiness - Plan: PT plan of care cert/re-cert      G-Codes - [id number]    Functional Assessment Tool Used DHI = 74   Functional Limitation Self care   Self Care Current Status [id number]) At least 60 percent but less than 80 percent impaired, limited or restricted   Self Care Goal Status [id number]) At least 40 percent but less than 60 percent impaired, limited or restricted       Problem List Patient Active Problem List   Diagnosis Date Noted  .  Tonsillar hypertrophy 07/07/2015  . Back pain  10/02/2014  . Obesity 07/11/2014  . S/P right TKA 05/10/2014  . S/P knee replacement 05/10/2014  . Atrial tachycardia, paroxysmal ([place]) 02/22/2014  . Hyperglycemia 12/31/2013  . Acute sinusitis with symptoms > 10 days 12/20/2013  . Wheezing 12/20/2013  . CAD (coronary artery disease), native coronary artery 11/22/2013  . SOB (shortness of breath) 11/03/2013  . Undiagnosed cardiac murmurs 09/05/2013  . Preventative health care 09/05/2013  . Tinea corporis 02/23/2013  . Musculoskeletal pain 11/10/2012  . Vasomotor rhinitis 04/05/2012  . Obstructive sleep apnea 04/05/2012  . Anemia 04/05/2012  . Elevated LFTs 04/05/2012  . OA (osteoarthritis) of knee 04/05/2012  . Hernia 10/13/2010  . Hyperlipidemia 09/11/2007  . Anxiety and depression 09/11/2007  . Essential hypertension 09/11/2007  . GERD 09/11/2007  . RENAL CALCULUS 09/11/2007  . RENAL CYST 09/11/2007  . COLONIC POLYPS, HX OF 09/11/2007   Billie Ruddy, PT, DPT [facility] [address] [place], [place], [address] Phone: [phone]   Fax:  [phone] 11/28/2015, 1:15 PM  Name: [name] MRN: [id number] Date of Birth: 10/20/1950

## 2015-11-29 ENCOUNTER — Ambulatory Visit: Payer: 59 | Admitting: Physical Therapy

## 2015-11-29 DIAGNOSIS — H8111 Benign paroxysmal vertigo, right ear: Secondary | ICD-10-CM | POA: Diagnosis not present

## 2015-11-29 DIAGNOSIS — R42 Dizziness and giddiness: Secondary | ICD-10-CM | POA: Diagnosis not present

## 2015-11-29 DIAGNOSIS — H8112 Benign paroxysmal vertigo, left ear: Secondary | ICD-10-CM

## 2015-11-29 NOTE — Patient Instructions (Signed)
Tip Card 1.The goal of habituation training is to assist in decreasing symptoms of vertigo, dizziness, or nausea provoked by specific head and body motions. 2.These exercises may initially increase symptoms; however, be persistent and work through symptoms. With repetition and time, the exercises will assist in reducing or eliminating symptoms. 3.Exercises should be stopped and discussed with the therapist if you experience any of the following: - Sudden change or fluctuation in hearing - New onset of ringing in the ears, or increase in current intensity - Any fluid discharge from the ear - Severe pain in neck or back - Extreme nausea   Copyright  VHI. All rights reserved.  Sit to Side-Lying   Sit on edge of bed. Lie down onto the right side and hold until dizziness stops, plus 20 seconds.  Return to sitting and wait until dizziness stops, plus 20 seconds.  Repeat to the left side. Repeat sequence 5 times per session. Do 2 sessions per day.     

## 2015-11-29 NOTE — Therapy (Signed)
[facility] [address] [place], [place], [address] Phone: [phone]   Fax:  [phone]  Physical Therapy Treatment  Patient Details  Name: [name] MRN: [id number] Date of Birth: 08-28-1950 Referring Provider: Mackie Pai, PA-C  Encounter Date: 11/29/2015      PT End of Session - 11/29/15 1633    Visit Number 2   Number of Visits 9   Date for PT Re-Evaluation 01/12/16   Authorization Type UMR   Authorization Time Period G Codes required   PT Start Time 1016   PT Stop Time 1102   PT Time Calculation (min) 46 min   Activity Tolerance Other (comment)  Limited by nausea today.   Behavior During Therapy [facility] for tasks assessed/performed      Past Medical History  Diagnosis Date  . GERD (gastroesophageal reflux disease)   . Anxiety   . Hyperlipidemia   . Hypertension   . Hx of colonic polyps   . Ventral hernia   . Chicken pox as a child  . Measles as a child  . Mumps as a child  . Obesity   . Vasomotor rhinitis 04/05/2012  . Sleep apnea 04/05/2012  . Elevated LFTs 04/05/2012  . OA (osteoarthritis) of knee 04/05/2012  . Tinea corporis 02/23/2013  . Preventative health care 09/05/2013  . Chronic diastolic CHF (congestive heart failure), NYHA class 1 ([place])   . Complication of anesthesia     pt states feels different in her body after anesthesia when waking up and also experiences a smell of burnt plastic for approx a wk   . PONV (postoperative nausea and vomiting)   . Dysrhythmia     occas palpitations  . Heart murmur     hx of one at birth   . Shortness of breath dyspnea     walking distances / climbing stairs  . Obesity 07/11/2014  . Back pain 10/02/2014  . Anxiety state 09/11/2007    Qualifier: Diagnosis of  By: Scherrie Gerlach    . Kidney stones     cyst on kidney Dr Dorina Hoyer  . Anemia 04/05/2012    Past Surgical History  Procedure Laterality Date  . Cholecystectomy    . Meniscus  repair  2009  . Total knee arthroplasty  2011    left  . Knee arthroscopy  dec 2011    right  . Mouth surgery      teeth implants  . Hernia repair  02/08/11    ventral hernia  . Cesarean section      X 3  . Wisdom tooth extraction  2000  . Cardiac catheterization      normal coroary arteries per patient  . Tubal ligation    . Cardiovascular stress test      10/12/2013  . Total knee arthroplasty Right 05/10/2014    Procedure: RIGHT TOTAL KNEE ARTHROPLASTY;  Surgeon: Mauri Pole, MD;  Location: [facility] ORS;  Service: Orthopedics;  Laterality: Right;  . Left and right heart catheterization with coronary angiogram N/A 11/08/2013    Procedure: LEFT AND RIGHT HEART CATHETERIZATION WITH CORONARY ANGIOGRAM;  Surgeon: [name], MD;  Location: [facility] CATH LAB;  Service: Cardiovascular;  Laterality: N/A;  . Joint replacement      bilateral  . Colonoscopy with propofol N/A 03/28/2015    Procedure: COLONOSCOPY WITH PROPOFOL;  Surgeon: Juanita Craver, MD;  Location: [facility] ENDOSCOPY;  Service: Endoscopy;  Laterality: N/A;    There were no vitals  filed for this visit.      Subjective Assessment - 11/29/15 1018    Subjective "My head actually feels crazier today than it did yesterday."   Pertinent History PMH significant for: HTN, HLD, CHF (per chart, but pt unaware), OSA with CPAP, B knee arthroscopy, B TKR, cardiac catheterization   Patient Stated Goals "I want to find out the steps to fix this vertigo if it comes back."   Currently in Pain? No/denies                Vestibular Assessment - 11/29/15 0001    Symptom Behavior   Type of Dizziness "Funny feeling in head"   Positional Testing   Dix-Hallpike Dix-Hallpike Right;Dix-Hallpike Left   Dix-Hallpike Right   Dix-Hallpike Right Duration >50 seconds   Dix-Hallpike Right Symptoms Upbeat, right rotatory nystagmus   Dix-Hallpike Left   Dix-Hallpike Left Duration Approx. 5 seconds of nystagmus; vertiginous symptoms for approx. 10  seconds   Dix-Hallpike Left Symptoms Upbeat, left rotatory nystagmus                  Vestibular Treatment/Exercise - 11/29/15 0001    Vestibular Treatment/Exercise   Vestibular Treatment Provided Canalith Repositioning;Habituation   Canalith Repositioning Epley Manuever Left;Semont Procedure Right Posterior   Habituation Exercises Brandt Daroff    EPLEY MANUEVER LEFT   Number of Reps  1   Overall Response  No change    RESPONSE DETAILS LEFT When in position 2 of Epley (R Dix-Hallpike position), noted R upbeating torsional nystagmus >50 seconds accompanied by vertigo.   Semont Procedure Right Posterior   Number of Reps  1   Overall Response  Symptoms Worsened   Response Details  After prolonged seated rest break (due to nausea), reassessment of R Dix-Hallpike revealed R upbeating torsional nystagmus x45 seconds. Therefore, educated pt on Nestor Lewandowsky for habituation; see below.   Nestor Lewandowsky   Number of Reps  5   Symptom Description  Initial trial: approx. 35 seconds of R upbeating torsional nystagmus in R [employer]; approx. 30 seconds of L upbeating torsional nystagmus in L Nestor Lewandowsky. On final trial, no nystagmus and pt asymptomatic.               PT Education - 11/29/15 1638    Education provided Yes          PT Short Term Goals - 11/28/15 1252    PT SHORT TERM GOAL #1   Title STG's = LTG's           PT Long Term Goals - 11/28/15 1253    PT LONG TERM GOAL #1   Title Positional vertigo testing will be negative to indicate resolved BPPV.   (Target date: 12/29/15)   PT LONG TERM GOAL #2   Title Pt will independently perform vestibular HEP to maximize functional gains made in PT.    PT LONG TERM GOAL #3   Title Pt will decrease DHI score from 74 to < / =  56 to indicate significant decrease in pt-perceived disability due to dizziness.    PT LONG TERM GOAL #4   Title Assess strength and balance, if indicated, after vertigo clears.                 Plan - 11/29/15 1634    Clinical Impression Statement Testing of posterior canals reveals R upbeating torsional nystagmus > 50 seconds in R Dix Hallpike, 5 seconds of L upbeating torsional nystagmus in L Dix-Hallpike. Unable  to rule out R posterior canal cupulolithasis. Therefore, treated with R PC Semont Maneuver x1 trial, then educated pt on Nestor Lewandowsky for habituation. After Nestor Lewandowsky x5 reps per side, noted no nystagmus and pt asymptomatic.  Pt required multiple prolonged seated rest breaks due to nausea. Contacted referring provider to determine if it would be appropriate for patient to be pre-medicated for nausea prior to next PT session.    Rehab Potential Good   Clinical Impairments Affecting Rehab Potential Positive: pt motivation and compliance; Negative: unable to rule out multi-canal BPPV on evaluation; complex PMH   PT Frequency 2x / week   PT Duration 4 weeks   PT Treatment/Interventions Canalith Repostioning;ADLs/Self Care Home Management;Balance training;Therapeutic exercise;Therapeutic activities;Patient/family education;Functional mobility training;Neuromuscular re-education;Stair training;Gait training;Vestibular   PT Next Visit Plan Ask if pt premedicated for nausea. Reassess for BPPV (R PC > L PC > L HC) and treat accordingly.   Consulted and Agree with Plan of Care Patient      Patient will benefit from skilled therapeutic intervention in order to improve the following deficits and impairments:  Dizziness  Visit Diagnosis: BPPV (benign paroxysmal positional vertigo), right  BPPV (benign paroxysmal positional vertigo), left  Dizziness and giddiness       G-Codes - 12/27/15 1256    Functional Assessment Tool Used DHI = 74   Functional Limitation Self care   Self Care Current Status [id number]) At least 60 percent but less than 80 percent impaired, limited or restricted   Self Care Goal Status [id number]) At least 40 percent but less than 60 percent  impaired, limited or restricted      Problem List Patient Active Problem List   Diagnosis Date Noted  . Tonsillar hypertrophy 07/07/2015  . Back pain 10/02/2014  . Obesity 07/11/2014  . S/P right TKA 05/10/2014  . S/P knee replacement 05/10/2014  . Atrial tachycardia, paroxysmal ([place]) 02/22/2014  . Hyperglycemia 12/31/2013  . Acute sinusitis with symptoms > 10 days 12/20/2013  . Wheezing 12/20/2013  . CAD (coronary artery disease), native coronary artery 11/22/2013  . SOB (shortness of breath) 11/03/2013  . Undiagnosed cardiac murmurs 09/05/2013  . Preventative health care 09/05/2013  . Tinea corporis 02/23/2013  . Musculoskeletal pain 11/10/2012  . Vasomotor rhinitis 04/05/2012  . Obstructive sleep apnea 04/05/2012  . Anemia 04/05/2012  . Elevated LFTs 04/05/2012  . OA (osteoarthritis) of knee 04/05/2012  . Hernia 10/13/2010  . Hyperlipidemia 09/11/2007  . Anxiety and depression 09/11/2007  . Essential hypertension 09/11/2007  . GERD 09/11/2007  . RENAL CALCULUS 09/11/2007  . RENAL CYST 09/11/2007  . COLONIC POLYPS, HX OF 09/11/2007    Billie Ruddy, PT, DPT [facility] [address] [place], [place], [address] Phone: [phone]   Fax:  [phone] 11/29/2015, 4:41 PM  Name: [name] MRN: [id number] Date of Birth: 1950/11/10

## 2015-12-13 ENCOUNTER — Ambulatory Visit: Payer: 59 | Admitting: Physical Therapy

## 2015-12-15 ENCOUNTER — Encounter: Payer: 59 | Admitting: Physical Therapy

## 2015-12-20 ENCOUNTER — Encounter: Payer: Self-pay | Admitting: Physical Therapy

## 2015-12-20 ENCOUNTER — Ambulatory Visit: Payer: 59 | Admitting: Physical Therapy

## 2015-12-20 NOTE — Therapy (Signed)
[facility] [address] [place], [place], [address] Phone: [phone]   Fax:  [phone]  Patient Details  Name: [name] MRN: [id number] Date of Birth: 06-Mar-1951 Referring Provider:  Mackie Pai, PA-C  Encounter Date: 12/20/2015   PHYSICAL THERAPY DISCHARGE SUMMARY  Visits from Start of Care: 2  Current functional level related to goals / functional outcomes: Unknown, as patient did not return to PT after initial 2 sessions due to pt perception of vertigo resolving.      PT Long Term Goals - 11/28/15 1253    PT LONG TERM GOAL #1   Title Positional vertigo testing will be negative to indicate resolved BPPV.   (Target date: 12/29/15)   PT LONG TERM GOAL #2   Title Pt will independently perform vestibular HEP to maximize functional gains made in PT.    PT LONG TERM GOAL #3   Title Pt will decrease DHI score from 74 to < / =  56 to indicate significant decrease in pt-perceived disability due to dizziness.    PT LONG TERM GOAL #4   Title Assess strength and balance, if indicated, after vertigo clears.          Remaining deficits: Unknown, as patient cancelled remaining PT sessions due to feeling better.   Education / Equipment: HEP for habituation; education on BPPV  Plan: Patient agrees to discharge.    Patient is being discharged due to being pleased with the current functional level.  ?????     Billie Ruddy, PT, DPT [facility] [address] [place], [place], [address] Phone: [phone]   Fax:  [phone] 12/20/2015, 1:58 PM

## 2015-12-22 ENCOUNTER — Encounter: Payer: 59 | Admitting: Physical Therapy

## 2015-12-27 ENCOUNTER — Encounter: Payer: 59 | Admitting: Physical Therapy

## 2015-12-29 ENCOUNTER — Encounter: Payer: 59 | Admitting: Physical Therapy

## 2016-01-26 MED FILL — LOSARTAN POTASSIUM 100 MG T: 100 | 90 days supply | Qty: 90 | Fill #1

## 2016-01-26 MED FILL — DICLOFENAC SOD EC 75 MG TAB: 75 | 90 days supply | Qty: 180 | Fill #1

## 2016-01-26 MED FILL — ESOMEPRAZOLE MAG DR 40 MG C: 40 | 90 days supply | Qty: 90 | Fill #1

## 2016-01-26 MED FILL — ROSUVASTATIN CALCIUM 20 MG: 20 | 90 days supply | Qty: 90 | Fill #2

## 2016-02-15 ENCOUNTER — Ambulatory Visit: Payer: 59 | Admitting: Family Medicine

## 2016-02-27 MED FILL — FUROSEMIDE 20 MG TABLET: 20 | 90 days supply | Qty: 90 | Fill #3

## 2016-02-27 MED FILL — ESCITALOPRAM 20 MG TABLET: 20 | 90 days supply | Qty: 90 | Fill #1

## 2016-02-27 MED FILL — MONTELUKAST SOD 10 MG TAB: 10 | 90 days supply | Qty: 90 | Fill #1

## 2016-02-27 MED FILL — CARTIA XT 120 MG CAPSULE SA: 120 | 90 days supply | Qty: 90 | Fill #1

## 2016-02-27 MED FILL — BYSTOLIC 10 MG TABLET: 10 | 90 days supply | Qty: 90 | Fill #2

## 2016-04-18 ENCOUNTER — Encounter: Payer: Self-pay | Admitting: Family Medicine

## 2016-04-18 ENCOUNTER — Ambulatory Visit (INDEPENDENT_AMBULATORY_CARE_PROVIDER_SITE_OTHER): Payer: Commercial Managed Care - HMO | Admitting: Family Medicine

## 2016-04-18 VITALS — BP 143/83 | HR 67 | Temp 98.9°F | Ht 65.0 in | Wt 328.2 lb

## 2016-04-18 DIAGNOSIS — Z1239 Encounter for other screening for malignant neoplasm of breast: Secondary | ICD-10-CM

## 2016-04-18 DIAGNOSIS — E6609 Other obesity due to excess calories: Secondary | ICD-10-CM

## 2016-04-18 DIAGNOSIS — Z0000 Encounter for general adult medical examination without abnormal findings: Secondary | ICD-10-CM | POA: Diagnosis not present

## 2016-04-18 DIAGNOSIS — Z23 Encounter for immunization: Secondary | ICD-10-CM

## 2016-04-18 DIAGNOSIS — D508 Other iron deficiency anemias: Secondary | ICD-10-CM

## 2016-04-18 DIAGNOSIS — E782 Mixed hyperlipidemia: Secondary | ICD-10-CM | POA: Diagnosis not present

## 2016-04-18 DIAGNOSIS — IMO0001 Reserved for inherently not codable concepts without codable children: Secondary | ICD-10-CM

## 2016-04-18 DIAGNOSIS — Z1231 Encounter for screening mammogram for malignant neoplasm of breast: Secondary | ICD-10-CM

## 2016-04-18 DIAGNOSIS — R0602 Shortness of breath: Secondary | ICD-10-CM

## 2016-04-18 DIAGNOSIS — R739 Hyperglycemia, unspecified: Secondary | ICD-10-CM | POA: Diagnosis not present

## 2016-04-18 DIAGNOSIS — I110 Hypertensive heart disease with heart failure: Secondary | ICD-10-CM

## 2016-04-18 DIAGNOSIS — R221 Localized swelling, mass and lump, neck: Secondary | ICD-10-CM

## 2016-04-18 DIAGNOSIS — K219 Gastro-esophageal reflux disease without esophagitis: Secondary | ICD-10-CM

## 2016-04-18 DIAGNOSIS — E2839 Other primary ovarian failure: Secondary | ICD-10-CM

## 2016-04-18 DIAGNOSIS — I10 Essential (primary) hypertension: Secondary | ICD-10-CM | POA: Diagnosis not present

## 2016-04-18 DIAGNOSIS — I5030 Unspecified diastolic (congestive) heart failure: Secondary | ICD-10-CM

## 2016-04-18 MED ORDER — DICLOFENAC SODIUM 75 MG PO TBEC: 75.0000 mg | DELAYED_RELEASE_TABLET | Freq: Two times a day (BID) | ORAL | 1 refills | Status: DC

## 2016-04-18 MED ORDER — ESOMEPRAZOLE MAGNESIUM 40 MG PO CPDR: 40.0000 mg | DELAYED_RELEASE_CAPSULE | Freq: Every day | ORAL | 2 refills | Status: DC

## 2016-04-18 MED ORDER — DILTIAZEM HCL ER COATED BEADS 120 MG PO CP24: 120.0000 mg | ORAL_CAPSULE | Freq: Every day | ORAL | 2 refills | Status: DC

## 2016-04-18 MED ORDER — NEBIVOLOL HCL 10 MG PO TABS: 10.0000 mg | ORAL_TABLET | Freq: Every day | ORAL | 2 refills | Status: DC

## 2016-04-18 MED ORDER — LOSARTAN POTASSIUM 100 MG PO TABS: 100.0000 mg | ORAL_TABLET | Freq: Every day | ORAL | 2 refills | Status: DC

## 2016-04-18 MED ORDER — MONTELUKAST SODIUM 10 MG PO TABS: 10.0000 mg | ORAL_TABLET | Freq: Every day | ORAL | 2 refills | Status: DC

## 2016-04-18 MED ORDER — ROSUVASTATIN CALCIUM 20 MG PO TABS: 20.0000 mg | ORAL_TABLET | Freq: Every day | ORAL | 2 refills | Status: DC

## 2016-04-18 MED ORDER — FUROSEMIDE 20 MG PO TABS: 20.0000 mg | ORAL_TABLET | Freq: Every day | ORAL | 2 refills | Status: DC

## 2016-04-18 NOTE — Assessment & Plan Note (Signed)
Improving as her mobility improves

## 2016-04-18 NOTE — Assessment & Plan Note (Signed)
Patient encouraged to maintain heart healthy diet, regular exercise, adequate sleep. Consider daily probiotics. Take medications as prescribed 

## 2016-04-18 NOTE — Patient Instructions (Signed)
Preventive Care for Adults, Female A healthy lifestyle and preventive care can promote health and wellness. Preventive health guidelines for women include the following key practices.  A routine yearly physical is a good way to check with your health care provider about your health and preventive screening. It is a chance to share any concerns and updates on your health and to receive a thorough exam.  Visit your dentist for a routine exam and preventive care every 6 months. Brush your teeth twice a day and floss once a day. Good oral hygiene prevents tooth decay and gum disease.  The frequency of eye exams is based on your age, health, family medical history, use of contact lenses, and other factors. Follow your health care provider's recommendations for frequency of eye exams.  Eat a healthy diet. Foods like vegetables, fruits, whole grains, low-fat dairy products, and lean protein foods contain the nutrients you need without too many calories. Decrease your intake of foods high in solid fats, added sugars, and salt. Eat the right amount of calories for you.Get information about a proper diet from your health care provider, if necessary.  Regular physical exercise is one of the most important things you can do for your health. Most adults should get at least 150 minutes of moderate-intensity exercise (any activity that increases your heart rate and causes you to sweat) each week. In addition, most adults need muscle-strengthening exercises on 2 or more days a week.  Maintain a healthy weight. The body mass index (BMI) is a screening tool to identify possible weight problems. It provides an estimate of body fat based on height and weight. Your health care provider can find your BMI and can help you achieve or maintain a healthy weight.For adults 20 years and older:  A BMI below 18.5 is considered underweight.  A BMI of 18.5 to 24.9 is normal.  A BMI of 25 to 29.9 is considered overweight.  A  BMI of 30 and above is considered obese.  Maintain normal blood lipids and cholesterol levels by exercising and minimizing your intake of saturated fat. Eat a balanced diet with plenty of fruit and vegetables. Blood tests for lipids and cholesterol should begin at age 45 and be repeated every 5 years. If your lipid or cholesterol levels are high, you are over 50, or you are at high risk for heart disease, you may need your cholesterol levels checked more frequently.Ongoing high lipid and cholesterol levels should be treated with medicines if diet and exercise are not working.  If you smoke, find out from your health care provider how to quit. If you do not use tobacco, do not start.  Lung cancer screening is recommended for adults aged 45-80 years who are at high risk for developing lung cancer because of a history of smoking. A yearly low-dose CT scan of the lungs is recommended for people who have at least a 30-pack-year history of smoking and are a current smoker or have quit within the past 15 years. A pack year of smoking is smoking an average of 1 pack of cigarettes a day for 1 year (for example: 1 pack a day for 30 years or 2 packs a day for 15 years). Yearly screening should continue until the smoker has stopped smoking for at least 15 years. Yearly screening should be stopped for people who develop a health problem that would prevent them from having lung cancer treatment.  If you are pregnant, do not drink alcohol. If you are  breastfeeding, be very cautious about drinking alcohol. If you are not pregnant and choose to drink alcohol, do not have more than 1 drink per day. One drink is considered to be 12 ounces (355 mL) of beer, 5 ounces (148 mL) of wine, or 1.5 ounces (44 mL) of liquor.  Avoid use of street drugs. Do not share needles with anyone. Ask for help if you need support or instructions about stopping the use of drugs.  High blood pressure causes heart disease and increases the risk  of stroke. Your blood pressure should be checked at least every 1 to 2 years. Ongoing high blood pressure should be treated with medicines if weight loss and exercise do not work.  If you are 71-70 years old, ask your health care provider if you should take aspirin to prevent strokes.  Diabetes screening is done by taking a blood sample to check your blood glucose level after you have not eaten for a certain period of time (fasting). If you are not overweight and you do not have risk factors for diabetes, you should be screened once every 3 years starting at age 65. If you are overweight or obese and you are 9-73 years of age, you should be screened for diabetes every year as part of your cardiovascular risk assessment.  Breast cancer screening is essential preventive care for women. You should practice "breast self-awareness." This means understanding the normal appearance and feel of your breasts and may include breast self-examination. Any changes detected, no matter how small, should be reported to a health care provider. Women in their 32s and 30s should have a clinical breast exam (CBE) by a health care provider as part of a regular health exam every 1 to 3 years. After age 28, women should have a CBE every year. Starting at age 70, women should consider having a mammogram (breast X-ray test) every year. Women who have a family history of breast cancer should talk to their health care provider about genetic screening. Women at a high risk of breast cancer should talk to their health care providers about having an MRI and a mammogram every year.  Breast cancer gene (BRCA)-related cancer risk assessment is recommended for women who have family members with BRCA-related cancers. BRCA-related cancers include breast, ovarian, tubal, and peritoneal cancers. Having family members with these cancers may be associated with an increased risk for harmful changes (mutations) in the breast cancer genes BRCA1 and  BRCA2. Results of the assessment will determine the need for genetic counseling and BRCA1 and BRCA2 testing.  Your health care provider may recommend that you be screened regularly for cancer of the pelvic organs (ovaries, uterus, and vagina). This screening involves a pelvic examination, including checking for microscopic changes to the surface of your cervix (Pap test). You may be encouraged to have this screening done every 3 years, beginning at age 61.  For women ages 45-65, health care providers may recommend pelvic exams and Pap testing every 3 years, or they may recommend the Pap and pelvic exam, combined with testing for human papilloma virus (HPV), every 5 years. Some types of HPV increase your risk of cervical cancer. Testing for HPV may also be done on women of any age with unclear Pap test results.  Other health care providers may not recommend any screening for nonpregnant women who are considered low risk for pelvic cancer and who do not have symptoms. Ask your health care provider if a screening pelvic exam is right for  you.  If you have had past treatment for cervical cancer or a condition that could lead to cancer, you need Pap tests and screening for cancer for at least 20 years after your treatment. If Pap tests have been discontinued, your risk factors (such as having a new sexual partner) need to be reassessed to determine if screening should resume. Some women have medical problems that increase the chance of getting cervical cancer. In these cases, your health care provider may recommend more frequent screening and Pap tests.  Colorectal cancer can be detected and often prevented. Most routine colorectal cancer screening begins at the age of 50 years and continues through age 75 years. However, your health care provider may recommend screening at an earlier age if you have risk factors for colon cancer. On a yearly basis, your health care provider may provide home test kits to check  for hidden blood in the stool. Use of a small camera at the end of a tube, to directly examine the colon (sigmoidoscopy or colonoscopy), can detect the earliest forms of colorectal cancer. Talk to your health care provider about this at age 50, when routine screening begins. Direct exam of the colon should be repeated every 5-10 years through age 75 years, unless early forms of precancerous polyps or small growths are found.  People who are at an increased risk for hepatitis B should be screened for this virus. You are considered at high risk for hepatitis B if:  You were born in a country where hepatitis B occurs often. Talk with your health care provider about which countries are considered high risk.  Your parents were born in a high-risk country and you have not received a shot to protect against hepatitis B (hepatitis B vaccine).  You have HIV or AIDS.  You use needles to inject street drugs.  You live with, or have sex with, someone who has hepatitis B.  You get hemodialysis treatment.  You take certain medicines for conditions like cancer, organ transplantation, and autoimmune conditions.  Hepatitis C blood testing is recommended for all people born from 1945 through 1965 and any individual with known risks for hepatitis C.  Practice safe sex. Use condoms and avoid high-risk sexual practices to reduce the spread of sexually transmitted infections (STIs). STIs include gonorrhea, chlamydia, syphilis, trichomonas, herpes, HPV, and human immunodeficiency virus (HIV). Herpes, HIV, and HPV are viral illnesses that have no cure. They can result in disability, cancer, and death.  You should be screened for sexually transmitted illnesses (STIs) including gonorrhea and chlamydia if:  You are sexually active and are younger than 24 years.  You are older than 24 years and your health care provider tells you that you are at risk for this type of infection.  Your sexual activity has changed  since you were last screened and you are at an increased risk for chlamydia or gonorrhea. Ask your health care provider if you are at risk.  If you are at risk of being infected with HIV, it is recommended that you take a prescription medicine daily to prevent HIV infection. This is called preexposure prophylaxis (PrEP). You are considered at risk if:  You are sexually active and do not regularly use condoms or know the HIV status of your partner(s).  You take drugs by injection.  You are sexually active with a partner who has HIV.  Talk with your health care provider about whether you are at high risk of being infected with HIV. If   you choose to begin PrEP, you should first be tested for HIV. You should then be tested every 3 months for as long as you are taking PrEP.  Osteoporosis is a disease in which the bones lose minerals and strength with aging. This can result in serious bone fractures or breaks. The risk of osteoporosis can be identified using a bone density scan. Women ages 67 years and over and women at risk for fractures or osteoporosis should discuss screening with their health care providers. Ask your health care provider whether you should take a calcium supplement or vitamin D to reduce the rate of osteoporosis.  Menopause can be associated with physical symptoms and risks. Hormone replacement therapy is available to decrease symptoms and risks. You should talk to your health care provider about whether hormone replacement therapy is right for you.  Use sunscreen. Apply sunscreen liberally and repeatedly throughout the day. You should seek shade when your shadow is shorter than you. Protect yourself by wearing long sleeves, pants, a wide-brimmed hat, and sunglasses year round, whenever you are outdoors.  Once a month, do a whole body skin exam, using a mirror to look at the skin on your back. Tell your health care provider of new moles, moles that have irregular borders, moles that  are larger than a pencil eraser, or moles that have changed in shape or color.  Stay current with required vaccines (immunizations).  Influenza vaccine. All adults should be immunized every year.  Tetanus, diphtheria, and acellular pertussis (Td, Tdap) vaccine. Pregnant women should receive 1 dose of Tdap vaccine during each pregnancy. The dose should be obtained regardless of the length of time since the last dose. Immunization is preferred during the 27th-36th week of gestation. An adult who has not previously received Tdap or who does not know her vaccine status should receive 1 dose of Tdap. This initial dose should be followed by tetanus and diphtheria toxoids (Td) booster doses every 10 years. Adults with an unknown or incomplete history of completing a 3-dose immunization series with Td-containing vaccines should begin or complete a primary immunization series including a Tdap dose. Adults should receive a Td booster every 10 years.  Varicella vaccine. An adult without evidence of immunity to varicella should receive 2 doses or a second dose if she has previously received 1 dose. Pregnant females who do not have evidence of immunity should receive the first dose after pregnancy. This first dose should be obtained before leaving the health care facility. The second dose should be obtained 4-8 weeks after the first dose.  Human papillomavirus (HPV) vaccine. Females aged 13-26 years who have not received the vaccine previously should obtain the 3-dose series. The vaccine is not recommended for use in pregnant females. However, pregnancy testing is not needed before receiving a dose. If a female is found to be pregnant after receiving a dose, no treatment is needed. In that case, the remaining doses should be delayed until after the pregnancy. Immunization is recommended for any person with an immunocompromised condition through the age of 61 years if she did not get any or all doses earlier. During the  3-dose series, the second dose should be obtained 4-8 weeks after the first dose. The third dose should be obtained 24 weeks after the first dose and 16 weeks after the second dose.  Zoster vaccine. One dose is recommended for adults aged 30 years or older unless certain conditions are present.  Measles, mumps, and rubella (MMR) vaccine. Adults born  before 1957 generally are considered immune to measles and mumps. Adults born in 1957 or later should have 1 or more doses of MMR vaccine unless there is a contraindication to the vaccine or there is laboratory evidence of immunity to each of the three diseases. A routine second dose of MMR vaccine should be obtained at least 28 days after the first dose for students attending postsecondary schools, health care workers, or international travelers. People who received inactivated measles vaccine or an unknown type of measles vaccine during 1963-1967 should receive 2 doses of MMR vaccine. People who received inactivated mumps vaccine or an unknown type of mumps vaccine before 1979 and are at high risk for mumps infection should consider immunization with 2 doses of MMR vaccine. For females of childbearing age, rubella immunity should be determined. If there is no evidence of immunity, females who are not pregnant should be vaccinated. If there is no evidence of immunity, females who are pregnant should delay immunization until after pregnancy. Unvaccinated health care workers born before 1957 who lack laboratory evidence of measles, mumps, or rubella immunity or laboratory confirmation of disease should consider measles and mumps immunization with 2 doses of MMR vaccine or rubella immunization with 1 dose of MMR vaccine.  Pneumococcal 13-valent conjugate (PCV13) vaccine. When indicated, a person who is uncertain of his immunization history and has no record of immunization should receive the PCV13 vaccine. All adults 65 years of age and older should receive this  vaccine. An adult aged 19 years or older who has certain medical conditions and has not been previously immunized should receive 1 dose of PCV13 vaccine. This PCV13 should be followed with a dose of pneumococcal polysaccharide (PPSV23) vaccine. Adults who are at high risk for pneumococcal disease should obtain the PPSV23 vaccine at least 8 weeks after the dose of PCV13 vaccine. Adults older than 65 years of age who have normal immune system function should obtain the PPSV23 vaccine dose at least 1 year after the dose of PCV13 vaccine.  Pneumococcal polysaccharide (PPSV23) vaccine. When PCV13 is also indicated, PCV13 should be obtained first. All adults aged 65 years and older should be immunized. An adult younger than age 65 years who has certain medical conditions should be immunized. Any person who resides in a nursing home or long-term care facility should be immunized. An adult smoker should be immunized. People with an immunocompromised condition and certain other conditions should receive both PCV13 and PPSV23 vaccines. People with human immunodeficiency virus (HIV) infection should be immunized as soon as possible after diagnosis. Immunization during chemotherapy or radiation therapy should be avoided. Routine use of PPSV23 vaccine is not recommended for American Indians, [place] Natives, or people younger than 65 years unless there are medical conditions that require PPSV23 vaccine. When indicated, people who have unknown immunization and have no record of immunization should receive PPSV23 vaccine. One-time revaccination 5 years after the first dose of PPSV23 is recommended for people aged 19-64 years who have chronic kidney failure, nephrotic syndrome, asplenia, or immunocompromised conditions. People who received 1-2 doses of PPSV23 before age 65 years should receive another dose of PPSV23 vaccine at age 65 years or later if at least 5 years have passed since the previous dose. Doses of PPSV23 are not  needed for people immunized with PPSV23 at or after age 65 years.  Meningococcal vaccine. Adults with asplenia or persistent complement component deficiencies should receive 2 doses of quadrivalent meningococcal conjugate (MenACWY-D) vaccine. The doses should be obtained   at least 2 months apart. Microbiologists working with certain meningococcal bacteria, [place] recruits, people at risk during an outbreak, and people who travel to or live in countries with a high rate of meningitis should be immunized. A first-year college student up through age 34 years who is living in a residence hall should receive a dose if she did not receive a dose on or after her 16th birthday. Adults who have certain high-risk conditions should receive one or more doses of vaccine.  Hepatitis A vaccine. Adults who wish to be protected from this disease, have certain high-risk conditions, work with hepatitis A-infected animals, work in hepatitis A research labs, or travel to or work in countries with a high rate of hepatitis A should be immunized. Adults who were previously unvaccinated and who anticipate close contact with an international adoptee during the first 60 days after arrival in the [place] from a country with a high rate of hepatitis A should be immunized.  Hepatitis B vaccine. Adults who wish to be protected from this disease, have certain high-risk conditions, may be exposed to blood or other infectious body fluids, are household contacts or sex partners of hepatitis B positive people, are clients or workers in certain care facilities, or travel to or work in countries with a high rate of hepatitis B should be immunized.  Haemophilus influenzae type b (Hib) vaccine. A previously unvaccinated person with asplenia or sickle cell disease or having a scheduled splenectomy should receive 1 dose of Hib vaccine. Regardless of previous immunization, a recipient of a hematopoietic stem cell transplant should receive a  3-dose series 6-12 months after her successful transplant. Hib vaccine is not recommended for adults with HIV infection. Preventive Services / Frequency Ages 35 to 4 years  Blood pressure check.** / Every 3-5 years.  Lipid and cholesterol check.** / Every 5 years beginning at age 60.  Clinical breast exam.** / Every 3 years for women in their 71s and 10s.  BRCA-related cancer risk assessment.** / For women who have family members with a BRCA-related cancer (breast, ovarian, tubal, or peritoneal cancers).  Pap test.** / Every 2 years from ages 76 through 26. Every 3 years starting at age 61 through age 76 or 93 with a history of 3 consecutive normal Pap tests.  HPV screening.** / Every 3 years from ages 37 through ages 60 to 51 with a history of 3 consecutive normal Pap tests.  Hepatitis C blood test.** / For any individual with known risks for hepatitis C.  Skin self-exam. / Monthly.  Influenza vaccine. / Every year.  Tetanus, diphtheria, and acellular pertussis (Tdap, Td) vaccine.** / Consult your health care provider. Pregnant women should receive 1 dose of Tdap vaccine during each pregnancy. 1 dose of Td every 10 years.  Varicella vaccine.** / Consult your health care provider. Pregnant females who do not have evidence of immunity should receive the first dose after pregnancy.  HPV vaccine. / 3 doses over 6 months, if 93 and younger. The vaccine is not recommended for use in pregnant females. However, pregnancy testing is not needed before receiving a dose.  Measles, mumps, rubella (MMR) vaccine.** / You need at least 1 dose of MMR if you were born in 1957 or later. You may also need a 2nd dose. For females of childbearing age, rubella immunity should be determined. If there is no evidence of immunity, females who are not pregnant should be vaccinated. If there is no evidence of immunity, females who are  pregnant should delay immunization until after pregnancy.  Pneumococcal  13-valent conjugate (PCV13) vaccine.** / Consult your health care provider.  Pneumococcal polysaccharide (PPSV23) vaccine.** / 1 to 2 doses if you smoke cigarettes or if you have certain conditions.  Meningococcal vaccine.** / 1 dose if you are age 23 to 53 years and a Market researcher living in a residence hall, or have one of several medical conditions, you need to get vaccinated against meningococcal disease. You may also need additional booster doses.  Hepatitis A vaccine.** / Consult your health care provider.  Hepatitis B vaccine.** / Consult your health care provider.  Haemophilus influenzae type b (Hib) vaccine.** / Consult your health care provider. Ages 16 to 76 years  Blood pressure check.** / Every year.  Lipid and cholesterol check.** / Every 5 years beginning at age 54 years.  Lung cancer screening. / Every year if you are aged 82-80 years and have a 30-pack-year history of smoking and currently smoke or have quit within the past 15 years. Yearly screening is stopped once you have quit smoking for at least 15 years or develop a health problem that would prevent you from having lung cancer treatment.  Clinical breast exam.** / Every year after age 31 years.  BRCA-related cancer risk assessment.** / For women who have family members with a BRCA-related cancer (breast, ovarian, tubal, or peritoneal cancers).  Mammogram.** / Every year beginning at age 105 years and continuing for as long as you are in good health. Consult with your health care provider.  Pap test.** / Every 3 years starting at age 15 years through age 53 or 4 years with a history of 3 consecutive normal Pap tests.  HPV screening.** / Every 3 years from ages 9 years through ages 68 to 91 years with a history of 3 consecutive normal Pap tests.  Fecal occult blood test (FOBT) of stool. / Every year beginning at age 51 years and continuing until age 23 years. You may not need to do this test if you get  a colonoscopy every 10 years.  Flexible sigmoidoscopy or colonoscopy.** / Every 5 years for a flexible sigmoidoscopy or every 10 years for a colonoscopy beginning at age 55 years and continuing until age 52 years.  Hepatitis C blood test.** / For all people born from [birth year] through 1965 and any individual with known risks for hepatitis C.  Skin self-exam. / Monthly.  Influenza vaccine. / Every year.  Tetanus, diphtheria, and acellular pertussis (Tdap/Td) vaccine.** / Consult your health care provider. Pregnant women should receive 1 dose of Tdap vaccine during each pregnancy. 1 dose of Td every 10 years.  Varicella vaccine.** / Consult your health care provider. Pregnant females who do not have evidence of immunity should receive the first dose after pregnancy.  Zoster vaccine.** / 1 dose for adults aged 25 years or older.  Measles, mumps, rubella (MMR) vaccine.** / You need at least 1 dose of MMR if you were born in 1957 or later. You may also need a second dose. For females of childbearing age, rubella immunity should be determined. If there is no evidence of immunity, females who are not pregnant should be vaccinated. If there is no evidence of immunity, females who are pregnant should delay immunization until after pregnancy.  Pneumococcal 13-valent conjugate (PCV13) vaccine.** / Consult your health care provider.  Pneumococcal polysaccharide (PPSV23) vaccine.** / 1 to 2 doses if you smoke cigarettes or if you have certain conditions.  Meningococcal vaccine.** /  Consult your health care provider.  Hepatitis A vaccine.** / Consult your health care provider.  Hepatitis B vaccine.** / Consult your health care provider.  Haemophilus influenzae type b (Hib) vaccine.** / Consult your health care provider. Ages 1 years and over  Blood pressure check.** / Every year.  Lipid and cholesterol check.** / Every 5 years beginning at age 45 years.  Lung cancer screening. / Every year if you  are aged 44-80 years and have a 30-pack-year history of smoking and currently smoke or have quit within the past 15 years. Yearly screening is stopped once you have quit smoking for at least 15 years or develop a health problem that would prevent you from having lung cancer treatment.  Clinical breast exam.** / Every year after age 48 years.  BRCA-related cancer risk assessment.** / For women who have family members with a BRCA-related cancer (breast, ovarian, tubal, or peritoneal cancers).  Mammogram.** / Every year beginning at age 38 years and continuing for as long as you are in good health. Consult with your health care provider.  Pap test.** / Every 3 years starting at age 79 years through age 78 or 46 years with 3 consecutive normal Pap tests. Testing can be stopped between 65 and 70 years with 3 consecutive normal Pap tests and no abnormal Pap or HPV tests in the past 10 years.  HPV screening.** / Every 3 years from ages 108 years through ages 14 or 75 years with a history of 3 consecutive normal Pap tests. Testing can be stopped between 65 and 70 years with 3 consecutive normal Pap tests and no abnormal Pap or HPV tests in the past 10 years.  Fecal occult blood test (FOBT) of stool. / Every year beginning at age 17 years and continuing until age 41 years. You may not need to do this test if you get a colonoscopy every 10 years.  Flexible sigmoidoscopy or colonoscopy.** / Every 5 years for a flexible sigmoidoscopy or every 10 years for a colonoscopy beginning at age 54 years and continuing until age 39 years.  Hepatitis C blood test.** / For all people born from [birth year] through 1965 and any individual with known risks for hepatitis C.  Osteoporosis screening.** / A one-time screening for women ages 53 years and over and women at risk for fractures or osteoporosis.  Skin self-exam. / Monthly.  Influenza vaccine. / Every year.  Tetanus, diphtheria, and acellular pertussis (Tdap/Td)  vaccine.** / 1 dose of Td every 10 years.  Varicella vaccine.** / Consult your health care provider.  Zoster vaccine.** / 1 dose for adults aged 25 years or older.  Pneumococcal 13-valent conjugate (PCV13) vaccine.** / Consult your health care provider.  Pneumococcal polysaccharide (PPSV23) vaccine.** / 1 dose for all adults aged 58 years and older.  Meningococcal vaccine.** / Consult your health care provider.  Hepatitis A vaccine.** / Consult your health care provider.  Hepatitis B vaccine.** / Consult your health care provider.  Haemophilus influenzae type b (Hib) vaccine.** / Consult your health care provider. ** Family history and personal history of risk and conditions may change your health care provider's recommendations.   This information is not intended to replace advice given to you by your health care provider. Make sure you discuss any questions you have with your health care provider.   Document Released: 08/06/2001 Document Revised: 07/01/2014 Document Reviewed: 11/05/2010 Elsevier Interactive Patient Education [employer].

## 2016-04-18 NOTE — Assessment & Plan Note (Signed)
Ranitidine added qhs due to symptoms that includes wheezing and cough after meals. If no improvement in a month call for referral to gastroenterology. Pap today, no concerns on exam.

## 2016-04-18 NOTE — Assessment & Plan Note (Signed)
Well controlled, no changes to meds. Encouraged heart healthy diet such as the DASH diet and exercise as tolerated.  °

## 2016-04-18 NOTE — Progress Notes (Signed)
Pre visit review using our clinic review tool, if applicable. No additional management support is needed unless otherwise documented below in the visit note. 

## 2016-04-18 NOTE — Assessment & Plan Note (Addendum)
Encouraged DASH diet, decrease po intake and increase exercise as tolerated. Needs 7-8 hours of sleep nightly. Avoid trans fats, eat small, frequent meals every 4-5 hours with lean proteins, complex carbs and healthy fats. Minimize simple carbs. Is interested in bariatric referral when available

## 2016-04-21 DIAGNOSIS — R221 Localized swelling, mass and lump, neck: Secondary | ICD-10-CM | POA: Insufficient documentation

## 2016-04-21 NOTE — Assessment & Plan Note (Addendum)
Referred to cardiology for further consideration due to worsening SOB

## 2016-04-21 NOTE — Assessment & Plan Note (Signed)
Tolerating statin, encouraged heart healthy diet, avoid trans fats, minimize simple carbs and saturated fats. Increase exercise as tolerated 

## 2016-04-21 NOTE — Assessment & Plan Note (Signed)
minimize simple carbs. Increase exercise as tolerated.  

## 2016-04-21 NOTE — Progress Notes (Signed)
[name], female   DOB: 1950/11/28, 65 y.o.   MRN: [id number]   Subjective:    [name], female    DOB: 1951/04/07, 65 y.o.   MRN: [id number]  Chief Complaint  Patient presents with  . Annual Exam    HPI Patient is in today for annual exam and follow upon numerous medical concerns. No recent hospitalization. Denies polyuria or polydipsia. Has not been following a a heart healthy diet routinely. Denies CP/palp/SOB/HA/congestion/fevers/GI or GU c/o. Taking meds as prescribed. Notes some swelling in her right anterior neck in the past month. No infury or fever.   Past Medical History:  Diagnosis Date  . Anemia 04/05/2012  . Anxiety   . Anxiety state 09/11/2007   Qualifier: Diagnosis of  By: Scherrie Gerlach    . Back pain 10/02/2014  . Chicken pox as a child  . Chronic diastolic CHF (congestive heart failure), NYHA class 1 ([place])   . Complication of anesthesia    pt states feels different in her body after anesthesia when waking up and also experiences a smell of burnt plastic for approx a wk   . Dysrhythmia    occas palpitations  . Elevated LFTs 04/05/2012  . GERD (gastroesophageal reflux disease)   . Heart murmur    hx of one at birth   . Hx of colonic polyps   . Hyperlipidemia   . Hypertension   . Kidney stones    cyst on kidney Dr Dorina Hoyer  . LVH (left ventricular hypertrophy) due to hypertensive disease, with heart failure ([place]) [id number]   Grade diastolic dysfunction  . Measles as a child  . Mumps as a child  . OA (osteoarthritis) of knee 04/05/2012  . Obesity   . Obesity 07/11/2014  . PONV (postoperative nausea and vomiting)   . Preventative health care 09/05/2013  . Shortness of breath dyspnea    walking distances / climbing stairs  . Sleep apnea 04/05/2012  . Tinea corporis 02/23/2013  . Vasomotor rhinitis 04/05/2012  . Ventral hernia     Past Surgical History:  Procedure Laterality Date  . CARDIAC CATHETERIZATION     normal coroary arteries per patient  . CARDIOVASCULAR STRESS TEST     10/12/2013  . CESAREAN SECTION     X 3  . CHOLECYSTECTOMY    . COLONOSCOPY WITH PROPOFOL N/A 03/28/2015   Procedure: COLONOSCOPY WITH PROPOFOL;  Surgeon: Juanita Craver, MD;  Location: [facility];  Service: Endoscopy;  Laterality: N/A;  . HERNIA REPAIR  02/08/11   ventral hernia  . JOINT REPLACEMENT     bilateral  . KNEE ARTHROSCOPY  dec 2011   right  . LEFT AND RIGHT HEART CATHETERIZATION WITH CORONARY ANGIOGRAM N/A 11/08/2013   Procedure: LEFT AND RIGHT HEART CATHETERIZATION WITH CORONARY ANGIOGRAM;  Surgeon: [name], MD;  Location[facility];  Service: Cardiovascular;  Laterality: N/A;  . MENISCUS REPAIR  2009  . MOUTH SURGERY     teeth implants  . TOTAL KNEE ARTHROPLASTY  2011   left  . TOTAL KNEE ARTHROPLASTY Right 05/10/2014   Procedure: RIGHT TOTAL KNEE ARTHROPLASTY;  Surgeon: Mauri Pole, MD;  Location: [facility];  Service: Orthopedics;  Laterality: Right;  . TUBAL LIGATION    . WISDOM TOOTH EXTRACTION  2000    Family History  Problem Relation Age of Onset  . Thyroid disease Mother   . Hyperlipidemia Mother   . Heart attack Father 22  . Hypertension  Father   . Arthritis Father     RA  . Coronary artery disease Father   . Coronary artery disease Brother   . Heart disease Brother   . Cancer Maternal Aunt     colon  . Cancer Maternal Grandmother     colon  . Heart attack Maternal Grandfather   . Alcohol abuse Paternal Grandfather     Social History   Social History  . Marital status: Divorced    Spouse name: N/A  . Number of children: N/A  . Years of education: N/A   Occupational History  . Not on file.   Social History Main Topics  . Smoking status: Never Smoker  . Smokeless tobacco: Never Used  . Alcohol use No  . Drug use: No  . Sexual activity: No     Comment: lives with mother currently-stress   Other Topics Concern  . Not on file   Social History Narrative    . No narrative on file    Outpatient Medications Prior to Visit  Medication Sig Dispense Refill  . albuterol (PROVENTIL HFA;VENTOLIN HFA) 108 (90 Base) MCG/ACT inhaler Inhale 2 puffs into the lungs every 6 (six) hours as needed for wheezing or shortness of breath. 1 Inhaler 6  . beclomethasone (QVAR) 80 MCG/ACT inhaler Inhale 1-2 puffs into the lungs 2 (two) times daily. 1 Inhaler 5  . cetirizine (ZYRTEC) 10 MG tablet Take 10 mg by mouth daily.    . clotrimazole-betamethasone (LOTRISONE) cream Apply 1 application topically 2 (two) times daily as needed (for rash). 30 g 0  . cycloSPORINE (RESTASIS) 0.05 % ophthalmic emulsion 1 drop 2 (two) times daily.    . fluticasone (FLONASE) 50 MCG/ACT nasal spray Place 1 spray into both nostrils daily as needed for allergies. 48 g 1  . diclofenac (VOLTAREN) 75 MG EC tablet Take 1 tablet (75 mg total) by mouth 2 (two) times daily. 180 tablet 1  . diltiazem (CARDIZEM CD) 120 MG 24 hr capsule Take 1 capsule (120 mg total) by mouth daily. 90 capsule 1  . esomeprazole (NEXIUM) 40 MG capsule Take 1 capsule (40 mg total) by mouth daily. 90 capsule 1  . furosemide (LASIX) 20 MG tablet Take 1 tablet (20 mg total) by mouth daily. 90 tablet 1  . losartan (COZAAR) 100 MG tablet Take 1 tablet (100 mg total) by mouth daily. 90 tablet 1  . montelukast (SINGULAIR) 10 MG tablet Take 1 tablet (10 mg total) by mouth at bedtime. 90 tablet 1  . nebivolol (BYSTOLIC) 10 MG tablet Take 1 tablet (10 mg total) by mouth at bedtime. 90 tablet 1  . rosuvastatin (CRESTOR) 20 MG tablet Take 1 tablet (20 mg total) by mouth at bedtime. -. 90 tablet 3   No facility-administered medications prior to visit.     No Known Allergies  Review of Systems  Constitutional: Negative for fever, malaise/fatigue and weight loss.  HENT: Negative for congestion.   Eyes: Negative for blurred vision.  Respiratory: Positive for shortness of breath.   Cardiovascular: Negative for chest pain,  palpitations and leg swelling.  Gastrointestinal: Negative for abdominal pain, blood in stool and nausea.  Genitourinary: Negative for dysuria and frequency.  Musculoskeletal: Positive for neck pain. Negative for falls.  Skin: Negative for rash.  Neurological: Negative for dizziness, loss of consciousness and headaches.  Endo/Heme/Allergies: Negative for environmental allergies.  Psychiatric/Behavioral: Negative for depression. The patient is not nervous/anxious.        Objective:  Physical Exam  Constitutional: She is oriented to person, place, and time. She appears well-developed and well-nourished. No distress.  HENT:  Head: Normocephalic and atraumatic.  Eyes: Conjunctivae are normal.  Neck: Neck supple. No thyromegaly present.  Right anterior neck roughly 1 x 3 inch mobile, nontender lesion.   Cardiovascular: Normal rate, regular rhythm and normal heart sounds.   No murmur heard. Pulmonary/Chest: Effort normal and breath sounds normal. No respiratory distress.  Abdominal: Soft. Bowel sounds are normal. She exhibits no distension and no mass. There is no tenderness. There is no rebound and no guarding.  Periumbilical hernia, nontender.   Musculoskeletal: She exhibits no edema.  Lymphadenopathy:    She has no cervical adenopathy.  Neurological: She is alert and oriented to person, place, and time.  Skin: Skin is warm and dry.  Psychiatric: She has a normal mood and affect. Her behavior is normal.    BP (!) 143/83 (BP Location: Right Arm, Patient Position: Sitting, Cuff Size: Large)   Pulse 67   Temp 98.9 F (37.2 C) (Oral)   Ht 5\' 5"  (1.651 m)   Wt (!) 328 lb 4 oz (148.9 kg)   SpO2 94%   BMI 54.62 kg/m  Wt Readings from Last 3 Encounters:  04/18/16 (!) 328 lb 4 oz (148.9 kg)  11/15/15 (!) 324 lb 3.2 oz (147.1 kg)  10/16/15 (!) 326 lb 8 oz (148.1 kg)     Lab Results  Component Value Date   WBC 9.5 10/16/2015   HGB 13.7 10/16/2015   HCT 42.5 10/16/2015   PLT  291.0 10/16/2015   GLUCOSE 128 (H) 10/16/2015   CHOL 172 10/16/2015   TRIG 205.0 (H) 10/16/2015   HDL 38.60 (L) 10/16/2015   LDLDIRECT 102.0 10/16/2015   LDLCALC 90 04/17/2015   ALT 26 10/16/2015   AST 21 10/16/2015   NA 139 10/16/2015   K 4.1 10/16/2015   CL 100 10/16/2015   CREATININE 0.75 10/16/2015   BUN 16 10/16/2015   CO2 29 10/16/2015   TSH 1.22 10/16/2015   INR 1.01 05/03/2014   HGBA1C 6.5 10/16/2015    Lab Results  Component Value Date   TSH 1.22 10/16/2015   Lab Results  Component Value Date   WBC 9.5 10/16/2015   HGB 13.7 10/16/2015   HCT 42.5 10/16/2015   MCV 85.8 10/16/2015   PLT 291.0 10/16/2015   Lab Results  Component Value Date   NA 139 10/16/2015   K 4.1 10/16/2015   CO2 29 10/16/2015   GLUCOSE 128 (H) 10/16/2015   BUN 16 10/16/2015   CREATININE 0.75 10/16/2015   BILITOT 0.4 10/16/2015   ALKPHOS 92 10/16/2015   AST 21 10/16/2015   ALT 26 10/16/2015   PROT 7.5 10/16/2015   ALBUMIN 4.0 10/16/2015   CALCIUM 9.5 10/16/2015   ANIONGAP 12 05/11/2014   GFR 82.45 10/16/2015   Lab Results  Component Value Date   CHOL 172 10/16/2015   Lab Results  Component Value Date   HDL 38.60 (L) 10/16/2015   Lab Results  Component Value Date   LDLCALC 90 04/17/2015   Lab Results  Component Value Date   TRIG 205.0 (H) 10/16/2015   Lab Results  Component Value Date   CHOLHDL 4 10/16/2015   Lab Results  Component Value Date   HGBA1C 6.5 10/16/2015       Assessment & Plan:   Problem List Items Addressed This Visit    Hyperlipidemia    Tolerating statin, encouraged heart  healthy diet, avoid trans fats, minimize simple carbs and saturated fats. Increase exercise as tolerated      Relevant Medications   rosuvastatin (CRESTOR) 20 MG tablet   nebivolol (BYSTOLIC) 10 MG tablet   losartan (COZAAR) 100 MG tablet   furosemide (LASIX) 20 MG tablet   diltiazem (CARDIZEM CD) 120 MG 24 hr capsule   Other Relevant Orders   Lipid panel   Essential  hypertension    Well controlled, no changes to meds. Encouraged heart healthy diet such as the DASH diet and exercise as tolerated.       Relevant Medications   rosuvastatin (CRESTOR) 20 MG tablet   nebivolol (BYSTOLIC) 10 MG tablet   losartan (COZAAR) 100 MG tablet   furosemide (LASIX) 20 MG tablet   diltiazem (CARDIZEM CD) 120 MG 24 hr capsule   Other Relevant Orders   TSH   CBC   Comprehensive metabolic panel   Ambulatory referral to Cardiology   GERD    Ranitidine added qhs due to symptoms that includes wheezing and cough after meals. If no improvement in a month call for referral to gastroenterology. Pap today, no concerns on exam.       Relevant Medications   esomeprazole (NEXIUM) 40 MG capsule   Anemia - Primary   Preventative health care    Patient encouraged to maintain heart healthy diet, regular exercise, adequate sleep. Consider daily probiotics. Take medications as prescribed      SOB (shortness of breath)    Improving as her mobility improves      Hyperglycemia    minimize simple carbs. Increase exercise as tolerated.       Relevant Orders   Hemoglobin A1c   Obesity    Encouraged DASH diet, decrease po intake and increase exercise as tolerated. Needs 7-8 hours of sleep nightly. Avoid trans fats, eat small, frequent meals every 4-5 hours with lean proteins, complex carbs and healthy fats. Minimize simple carbs. Is interested in bariatric referral when available      LVH (left ventricular hypertrophy) due to hypertensive disease, with heart failure ([place])    Referred to cardiologyf or further consideration.       Relevant Medications   rosuvastatin (CRESTOR) 20 MG tablet   nebivolol (BYSTOLIC) 10 MG tablet   losartan (COZAAR) 100 MG tablet   furosemide (LASIX) 20 MG tablet   diltiazem (CARDIZEM CD) 120 MG 24 hr capsule   Other Relevant Orders   Ambulatory referral to Cardiology    Other Visit Diagnoses    Encounter for immunization       Relevant  Medications   rosuvastatin (CRESTOR) 20 MG tablet   nebivolol (BYSTOLIC) 10 MG tablet   montelukast (SINGULAIR) 10 MG tablet   losartan (COZAAR) 100 MG tablet   furosemide (LASIX) 20 MG tablet   esomeprazole (NEXIUM) 40 MG capsule   diltiazem (CARDIZEM CD) 120 MG 24 hr capsule   diclofenac (VOLTAREN) 75 MG EC tablet   Other Relevant Orders   Flu vaccine HIGH DOSE PF (Completed)   TSH   CBC   Comprehensive metabolic panel   Hemoglobin A1c   Lipid panel   DG Bone Density   MM Digital Screening   Ambulatory referral to Cardiology   US Soft Tissue Head/Neck   Breast cancer screening       Relevant Orders   MM Digital Screening   Estrogen deficiency       Relevant Orders   DG Bone Density   Diastolic congestive heart  failure, unspecified congestive heart failure chronicity (HCC)       Relevant Medications   rosuvastatin (CRESTOR) 20 MG tablet   nebivolol (BYSTOLIC) 10 MG tablet   losartan (COZAAR) 100 MG tablet   furosemide (LASIX) 20 MG tablet   diltiazem (CARDIZEM CD) 120 MG 24 hr capsule   Other Relevant Orders   Ambulatory referral to Cardiology   Neck mass       Relevant Orders   US Soft Tissue Head/Neck   Need for vaccination with 13-polyvalent pneumococcal conjugate vaccine       Relevant Orders   Pneumococcal conjugate vaccine 13-valent (Completed)      I am having Ms. Tow maintain her clotrimazole-betamethasone, cetirizine, fluticasone, beclomethasone, albuterol, cycloSPORINE, rosuvastatin, nebivolol, montelukast, losartan, furosemide, esomeprazole, diltiazem, and diclofenac.  Meds ordered this encounter  Medications  . rosuvastatin (CRESTOR) 20 MG tablet    Sig: Take 1 tablet (20 mg total) by mouth at bedtime. -.    Dispense:  90 tablet    Refill:  2    D/C PREVIOUS SCRIPTS FOR THIS MEDICATION  . nebivolol (BYSTOLIC) 10 MG tablet    Sig: Take 1 tablet (10 mg total) by mouth at bedtime.    Dispense:  90 tablet    Refill:  2    DISCONTINUE ALL PREVIOUS  REFILLS FOR THIS MEDICATION  . montelukast (SINGULAIR) 10 MG tablet    Sig: Take 1 tablet (10 mg total) by mouth at bedtime.    Dispense:  90 tablet    Refill:  2  . losartan (COZAAR) 100 MG tablet    Sig: Take 1 tablet (100 mg total) by mouth daily.    Dispense:  90 tablet    Refill:  2    DISCONTINUE ALL PREVIOUS REFILLS FOR THIS MEDICATION  . furosemide (LASIX) 20 MG tablet    Sig: Take 1 tablet (20 mg total) by mouth daily.    Dispense:  90 tablet    Refill:  2  . esomeprazole (NEXIUM) 40 MG capsule    Sig: Take 1 capsule (40 mg total) by mouth daily.    Dispense:  90 capsule    Refill:  2  . diltiazem (CARDIZEM CD) 120 MG 24 hr capsule    Sig: Take 1 capsule (120 mg total) by mouth daily.    Dispense:  90 capsule    Refill:  2    DISCONTINUE ALL PREVIOUS REFILLS FOR THIS MEDICATION  . diclofenac (VOLTAREN) 75 MG EC tablet    Sig: Take 1 tablet (75 mg total) by mouth 2 (two) times daily.    Dispense:  180 tablet    Refill:  1    DISCONTINUE ALL PREVIOUS REFILLS FOR THIS MEDICATION     Penni Homans, MD

## 2016-04-21 NOTE — Assessment & Plan Note (Signed)
Right sided.proceed with ultrasound

## 2016-04-24 ENCOUNTER — Encounter: Payer: Self-pay | Admitting: Family Medicine

## 2016-04-25 ENCOUNTER — Telehealth: Payer: Self-pay | Admitting: Family Medicine

## 2016-04-25 MED ORDER — ESCITALOPRAM OXALATE 20 MG PO TABS: 20.0000 mg | ORAL_TABLET | Freq: Every day | ORAL | 3 refills | Status: DC

## 2016-04-25 NOTE — Telephone Encounter (Signed)
Called the patient to question if on medication as is not on current list

## 2016-04-25 NOTE — Telephone Encounter (Signed)
She called back and is taking lexapro, sent in to mail order.

## 2016-04-25 NOTE — Telephone Encounter (Signed)
Pharmacy is call requesting refill of patient's Lexapro 20MG . Please advise   Pharmacy: [place], [place] contact: [phone]

## 2016-04-26 ENCOUNTER — Ambulatory Visit: Payer: Commercial Managed Care - HMO | Admitting: Cardiology

## 2016-04-29 ENCOUNTER — Ambulatory Visit (HOSPITAL_BASED_OUTPATIENT_CLINIC_OR_DEPARTMENT_OTHER)
Admission: RE | Admit: 2016-04-29 | Discharge: 2016-04-29 | Disposition: A | Discharge status: Home / Self Care | Payer: Commercial Managed Care - HMO | Source: Ambulatory Visit | Attending: Family Medicine | Admitting: Family Medicine

## 2016-04-29 ENCOUNTER — Other Ambulatory Visit: Payer: Self-pay | Admitting: Family Medicine

## 2016-04-29 DIAGNOSIS — Z23 Encounter for immunization: Secondary | ICD-10-CM

## 2016-04-29 DIAGNOSIS — E2839 Other primary ovarian failure: Secondary | ICD-10-CM

## 2016-04-29 DIAGNOSIS — R221 Localized swelling, mass and lump, neck: Secondary | ICD-10-CM

## 2016-04-29 DIAGNOSIS — M85852 Other specified disorders of bone density and structure, left thigh: Secondary | ICD-10-CM | POA: Diagnosis not present

## 2016-04-29 DIAGNOSIS — E042 Nontoxic multinodular goiter: Secondary | ICD-10-CM | POA: Diagnosis not present

## 2016-04-29 DIAGNOSIS — E049 Nontoxic goiter, unspecified: Secondary | ICD-10-CM | POA: Diagnosis not present

## 2016-04-29 DIAGNOSIS — Z1231 Encounter for screening mammogram for malignant neoplasm of breast: Secondary | ICD-10-CM | POA: Diagnosis not present

## 2016-04-29 DIAGNOSIS — Z1239 Encounter for other screening for malignant neoplasm of breast: Secondary | ICD-10-CM

## 2016-05-01 ENCOUNTER — Other Ambulatory Visit: Payer: Self-pay | Admitting: Family Medicine

## 2016-05-01 DIAGNOSIS — R928 Other abnormal and inconclusive findings on diagnostic imaging of breast: Secondary | ICD-10-CM

## 2016-05-02 ENCOUNTER — Encounter: Payer: Self-pay | Admitting: Cardiology

## 2016-05-02 ENCOUNTER — Ambulatory Visit (INDEPENDENT_AMBULATORY_CARE_PROVIDER_SITE_OTHER): Payer: Commercial Managed Care - HMO | Admitting: Cardiology

## 2016-05-02 VITALS — BP 138/88 | HR 56 | Ht 65.0 in | Wt 327.8 lb

## 2016-05-02 DIAGNOSIS — I471 Supraventricular tachycardia: Secondary | ICD-10-CM

## 2016-05-02 DIAGNOSIS — I493 Ventricular premature depolarization: Secondary | ICD-10-CM | POA: Diagnosis not present

## 2016-05-02 DIAGNOSIS — I2510 Atherosclerotic heart disease of native coronary artery without angina pectoris: Secondary | ICD-10-CM

## 2016-05-02 DIAGNOSIS — I5032 Chronic diastolic (congestive) heart failure: Secondary | ICD-10-CM | POA: Diagnosis not present

## 2016-05-02 DIAGNOSIS — I10 Essential (primary) hypertension: Secondary | ICD-10-CM

## 2016-05-02 DIAGNOSIS — E7800 Pure hypercholesterolemia, unspecified: Secondary | ICD-10-CM | POA: Diagnosis not present

## 2016-05-02 HISTORY — DX: Ventricular premature depolarization: I49.3

## 2016-05-02 LAB — BASIC METABOLIC PANEL
BUN: 19 mg/dL (ref 7–25)
CALCIUM: 9.3 mg/dL (ref 8.6–10.4)
CO2: 25 mmol/L (ref 20–31)
Chloride: 103 mmol/L (ref 98–110)
Creat: 0.77 mg/dL (ref 0.50–0.99)
GLUCOSE: 117 mg/dL — AB (ref 65–99)
POTASSIUM: 4.2 mmol/L (ref 3.5–5.3)
SODIUM: 140 mmol/L (ref 135–146)

## 2016-05-02 LAB — MAGNESIUM: MAGNESIUM: 2 mg/dL (ref 1.5–2.5)

## 2016-05-02 LAB — TSH: TSH: 0.95 m[IU]/L

## 2016-05-02 NOTE — Progress Notes (Signed)
Cardiology Office Note    Date:  05/02/2016   ID:  [name], DOB [birth year], MRN [id number]  PCP:  Penni Homans, MD  Cardiologist:  Fransico Him, MD   Chief Complaint  Patient presents with  . Coronary Artery Disease  . Hypertension  . Hyperlipidemia    History of Present Illness:  [name] is a 65 y.o. female with a history of HTN, chronic diastolic CHF, nonsustained atrial tachycardia,  Cath with nonobstructive ASCAD with 20% mid LAD, 20% prox left circ and 20% ostial RCA with elevated LVEDP and dyslipidemia who presents today for followup. She is doing well.  She denies any chest pain,  Palpitations, dizziness or syncope.She has chronic DOE that she attributes to asthma and obesity.  It is better when she uses her inhaler.    Past Medical History:  Diagnosis Date  . Anemia 04/05/2012  . Anxiety   . Anxiety state 09/11/2007   Qualifier: Diagnosis of  By: Scherrie Gerlach    . Atrial tachycardia, paroxysmal ([place])   . Back pain 10/02/2014  . Chicken pox as a child  . Chronic diastolic CHF (congestive heart failure), NYHA class 1 ([place])   . Complication of anesthesia    pt states feels different in her body after anesthesia when waking up and also experiences a smell of burnt plastic for approx a wk   . Elevated LFTs 04/05/2012  . GERD (gastroesophageal reflux disease)   . Heart murmur    hx of one at birth   . Hx of colonic polyps   . Hyperlipidemia   . Hypertension   . Kidney stones    cyst on kidney Dr Dorina Hoyer  . Measles as a child  . Mumps as a child  . OA (osteoarthritis) of knee 04/05/2012  . Obesity 07/11/2014  . PONV (postoperative nausea and vomiting)   . Preventative health care 09/05/2013  . PVC's (premature ventricular contractions) 05/02/2016  . Shortness of breath dyspnea    walking distances / climbing stairs  . Sleep apnea 04/05/2012  . Tinea corporis 02/23/2013  . Vasomotor rhinitis 04/05/2012  . Ventral hernia     Past Surgical  History:  Procedure Laterality Date  . CARDIAC CATHETERIZATION     normal coroary arteries per patient  . CARDIOVASCULAR STRESS TEST     10/12/2013  . CESAREAN SECTION     X 3  . CHOLECYSTECTOMY    . COLONOSCOPY WITH PROPOFOL N/A 03/28/2015   Procedure: COLONOSCOPY WITH PROPOFOL;  Surgeon: Juanita Craver, MD;  Location: [facility];  Service: Endoscopy;  Laterality: N/A;  . HERNIA REPAIR  02/08/11   ventral hernia  . JOINT REPLACEMENT     bilateral  . KNEE ARTHROSCOPY  dec 2011   right  . LEFT AND RIGHT HEART CATHETERIZATION WITH CORONARY ANGIOGRAM N/A 11/08/2013   Procedure: LEFT AND RIGHT HEART CATHETERIZATION WITH CORONARY ANGIOGRAM;  Surgeon: [name], MD;  Location: [facility] CATH LAB;  Service: Cardiovascular;  Laterality: N/A;  . MENISCUS REPAIR  2009  . MOUTH SURGERY     teeth implants  . TOTAL KNEE ARTHROPLASTY  2011   left  . TOTAL KNEE ARTHROPLASTY Right 05/10/2014   Procedure: RIGHT TOTAL KNEE ARTHROPLASTY;  Surgeon: Mauri Pole, MD;  Location: [facility] ORS;  Service: Orthopedics;  Laterality: Right;  . TUBAL LIGATION    . WISDOM TOOTH EXTRACTION  2000    Current Medications: Outpatient Medications Prior to Visit  Medication Sig Dispense Refill  .  albuterol (PROVENTIL HFA;VENTOLIN HFA) 108 (90 Base) MCG/ACT inhaler Inhale 2 puffs into the lungs every 6 (six) hours as needed for wheezing or shortness of breath. 1 Inhaler 6  . beclomethasone (QVAR) 80 MCG/ACT inhaler Inhale 1-2 puffs into the lungs 2 (two) times daily. (Patient taking differently: Inhale 1-2 puffs into the lungs as needed. ) 1 Inhaler 5  . clotrimazole-betamethasone (LOTRISONE) cream Apply 1 application topically 2 (two) times daily as needed (for rash). 30 g 0  . cycloSPORINE (RESTASIS) 0.05 % ophthalmic emulsion 1 drop 2 (two) times daily.    . diclofenac (VOLTAREN) 75 MG EC tablet Take 1 tablet (75 mg total) by mouth 2 (two) times daily. 180 tablet 1  . diltiazem (CARDIZEM CD) 120 MG 24 hr capsule  Take 1 capsule (120 mg total) by mouth daily. 90 capsule 2  . escitalopram (LEXAPRO) 20 MG tablet TAKE 1 TABLET ONE TIME DAILY 90 tablet 4  . fluticasone (FLONASE) 50 MCG/ACT nasal spray Place 1 spray into both nostrils daily as needed for allergies. 48 g 1  . furosemide (LASIX) 20 MG tablet Take 1 tablet (20 mg total) by mouth daily. 90 tablet 2  . losartan (COZAAR) 100 MG tablet Take 1 tablet (100 mg total) by mouth daily. 90 tablet 2  . montelukast (SINGULAIR) 10 MG tablet Take 1 tablet (10 mg total) by mouth at bedtime. 90 tablet 2  . nebivolol (BYSTOLIC) 10 MG tablet Take 1 tablet (10 mg total) by mouth at bedtime. 90 tablet 2  . rosuvastatin (CRESTOR) 20 MG tablet Take 1 tablet (20 mg total) by mouth at bedtime. -. 90 tablet 2  . cetirizine (ZYRTEC) 10 MG tablet Take 10 mg by mouth daily.    Marland Kitchen esomeprazole (NEXIUM) 40 MG capsule Take 1 capsule (40 mg total) by mouth daily. (Patient not taking: Reported on 05/02/2016) 90 capsule 2   No facility-administered medications prior to visit.      Allergies:   Patient has no known allergies.   Social History   Social History  . Marital status: Divorced    Spouse name: N/A  . Number of children: N/A  . Years of education: N/A   Social History Main Topics  . Smoking status: Never Smoker  . Smokeless tobacco: Never Used  . Alcohol use No  . Drug use: No  . Sexual activity: No     Comment: lives with mother currently-stress   Other Topics Concern  . None   Social History Narrative  . None     Family History:  The patient's family history includes Alcohol abuse in her paternal grandfather; Arthritis in her father; Cancer in her maternal aunt and maternal grandmother; Coronary artery disease in her brother and father; Heart attack in her maternal grandfather; Heart attack (age of onset: 3) in her father; Heart disease in her brother; Hyperlipidemia in her mother; Hypertension in her father; Thyroid disease in her mother.   ROS:     Please see the history of present illness.    ROS All other systems reviewed and are negative.  No flowsheet data found.     PHYSICAL EXAM:   VS:  BP 138/88   Pulse (!) 56   Ht 5\' 5"  (1.651 m)   Wt (!) 327 lb 12.8 oz (148.7 kg)   BMI 54.55 kg/m    GEN: Well nourished, well developed, in no acute distress  HEENT: normal  Neck: no JVD, carotid bruits, or masses Cardiac: RRR; no murmurs, rubs, or  gallops,no edema.  Intact distal pulses bilaterally.  Respiratory:  clear to auscultation bilaterally, normal work of breathing GI: soft, nontender, nondistended, + BS MS: no deformity or atrophy  Skin: warm and dry, no rash Neuro:  Alert and Oriented x 3, Strength and sensation are intact Psych: euthymic mood, full affect  Wt Readings from Last 3 Encounters:  05/02/16 (!) 327 lb 12.8 oz (148.7 kg)  04/18/16 (!) 328 lb 4 oz (148.9 kg)  11/15/15 (!) 324 lb 3.2 oz (147.1 kg)      Studies/Labs Reviewed:   EKG:  EKG is  ordered today and showed sinus bradycardia at 56bpm with bigeminal PVCs, incomplete RBBB and septal infarct and LAFB Recent Labs: 10/16/2015: ALT 26; BUN 16; Creatinine, Ser 0.75; Hemoglobin 13.7; Platelets 291.0; Potassium 4.1; Sodium 139; TSH 1.22   Lipid Panel    Component Value Date/Time   CHOL 172 10/16/2015 0830   TRIG 205.0 (H) 10/16/2015 0830   HDL 38.60 (L) 10/16/2015 0830   CHOLHDL 4 10/16/2015 0830   VLDL 41.0 (H) 10/16/2015 0830   LDLCALC 90 04/17/2015 1414   LDLDIRECT 102.0 10/16/2015 0830    Additional studies/ records that were reviewed today include:  none    ASSESSMENT:    1. Atrial tachycardia, paroxysmal ([place])   2. Coronary artery disease involving native coronary artery of native heart without angina pectoris   3. Essential hypertension   4. Pure hypercholesterolemia   5. Chronic diastolic CHF (congestive heart failure) ([place])   6. PVC's (premature ventricular contractions)      PLAN:  In order of problems listed  above:  1. Paroxysmal atrial tachycardia - suppressed on CCB and BB.  2. Non obstructive ASCAD with no angina.  Continue statin/BB and start ASA 81mg  daily.  3. HTN - BP controlled on current meds.  Continue CCB/ARB/BB. 4. Hyperlipidemia - LDL goal < 70.  Continue statin.  Check FLP and ALT.  5. Chronic diastolic CHF - she appears euvolemic on exam.  Continue Lasix and BB. Check BMET. 6. PVC's - she has bigeminal PVCs on monitor today.  I will get a 24 hour holter to assess PVC load.  Check 2D echo to assess LVF and make sure EF is normal. These are asymptomatic.  Continue BB and CCB.      Medication Adjustments/Labs and Tests Ordered: Current medicines are reviewed at length with the patient today.  Concerns regarding medicines are outlined above.  Medication changes, Labs and Tests ordered today are listed in the Patient Instructions below.  There are no Patient Instructions on file for this visit.   Signed, Fransico Him, MD  05/02/2016 9:20 AM    [facility] [address], [place], [place]  [address] Phone: [phone]; Fax: [phone]

## 2016-05-02 NOTE — Patient Instructions (Signed)
Medication Instructions:  Your physician recommends that you continue on your current medications as directed. Please refer to the Current Medication list given to you today.   Labwork: TODAY: BMET, Mag, TSH  Your physician recommends that you return for FASTING lab work. (LFTs, Lipids)  Testing/Procedures: Your physician has requested that you have an echocardiogram. Echocardiography is a painless test that uses sound waves to create images of your heart. It provides your doctor with information about the size and shape of your heart and how well your heart's chambers and valves are working. This procedure takes approximately one hour. There are no restrictions for this procedure.   Your physician has recommended that you wear a holter monitor. Holter monitors are medical devices that record the heart's electrical activity. Doctors most often use these monitors to diagnose arrhythmias. Arrhythmias are problems with the speed or rhythm of the heartbeat. The monitor is a small, portable device. You can wear one while you do your normal daily activities. This is usually used to diagnose what is causing palpitations/syncope (passing out).  Follow-Up: Your physician wants you to follow-up in: 6 months with Dr. [name]. You will receive a reminder letter in the mail two months in advance. If you don't receive a letter, please call our office to schedule the follow-up appointment.   Any Other Special Instructions Will Be Listed Below (If Applicable).     If you need a refill on your cardiac medications before your next appointment, please call your pharmacy.

## 2016-05-08 ENCOUNTER — Ambulatory Visit
Admission: RE | Admit: 2016-05-08 | Discharge: 2016-05-08 | Disposition: A | Discharge status: Home / Self Care | Payer: Commercial Managed Care - HMO | Source: Ambulatory Visit | Attending: Family Medicine | Admitting: Family Medicine

## 2016-05-08 ENCOUNTER — Other Ambulatory Visit: Payer: Self-pay | Admitting: Family Medicine

## 2016-05-08 DIAGNOSIS — R928 Other abnormal and inconclusive findings on diagnostic imaging of breast: Secondary | ICD-10-CM | POA: Diagnosis not present

## 2016-05-08 DIAGNOSIS — N6320 Unspecified lump in the left breast, unspecified quadrant: Secondary | ICD-10-CM | POA: Diagnosis not present

## 2016-05-13 ENCOUNTER — Ambulatory Visit
Admission: RE | Admit: 2016-05-13 | Discharge: 2016-05-13 | Disposition: A | Discharge status: Home / Self Care | Payer: Commercial Managed Care - HMO | Source: Ambulatory Visit | Attending: Family Medicine | Admitting: Family Medicine

## 2016-05-13 ENCOUNTER — Other Ambulatory Visit: Payer: Self-pay | Admitting: Family Medicine

## 2016-05-13 DIAGNOSIS — R928 Other abnormal and inconclusive findings on diagnostic imaging of breast: Secondary | ICD-10-CM | POA: Diagnosis not present

## 2016-05-13 DIAGNOSIS — N6320 Unspecified lump in the left breast, unspecified quadrant: Secondary | ICD-10-CM

## 2016-05-13 DIAGNOSIS — C50812 Malignant neoplasm of overlapping sites of left female breast: Secondary | ICD-10-CM | POA: Diagnosis not present

## 2016-05-13 DIAGNOSIS — Z170 Estrogen receptor positive status [ER+]: Secondary | ICD-10-CM | POA: Diagnosis not present

## 2016-05-14 ENCOUNTER — Telehealth: Payer: Self-pay | Admitting: *Deleted

## 2016-05-14 ENCOUNTER — Encounter: Payer: Self-pay | Admitting: *Deleted

## 2016-05-14 DIAGNOSIS — C50412 Malignant neoplasm of upper-outer quadrant of left female breast: Secondary | ICD-10-CM

## 2016-05-14 HISTORY — DX: Malignant neoplasm of upper-outer quadrant of left female breast: C50.412

## 2016-05-14 NOTE — Telephone Encounter (Signed)
Confirmed [facility] for 05/22/16 at 1215 .  Instructions and contact information given.

## 2016-05-17 ENCOUNTER — Telehealth: Payer: Self-pay | Admitting: *Deleted

## 2016-05-17 NOTE — Telephone Encounter (Signed)
Mailed [facility] packet to pt. 

## 2016-05-19 ENCOUNTER — Encounter: Payer: Self-pay | Admitting: General Surgery

## 2016-05-21 ENCOUNTER — Telehealth: Payer: Self-pay | Admitting: Cardiology

## 2016-05-21 NOTE — Telephone Encounter (Signed)
New message      Pt had to cancel her upcoming appts because she was newly diagnosed with breast cancer and need to attend other appts.  Talk to the nurse to give update on her health.  Please call

## 2016-05-21 NOTE — Telephone Encounter (Signed)
Called patient. She wants Dr. [name] to know that she is going to appts tomorrow to plan for her treatment of her newly diagnosed breast cancer.  They are all scheduled tomorrow, which was when her echo, labs and holter were scheduled.  She wants to let Dr. [name] know she will call to reschedule these asap.  She wants to have breast surgery asap, and will know more details after tomorrow.

## 2016-05-22 ENCOUNTER — Encounter: Payer: Self-pay | Admitting: Hematology and Oncology

## 2016-05-22 ENCOUNTER — Encounter: Payer: Self-pay | Admitting: Physical Therapy

## 2016-05-22 ENCOUNTER — Other Ambulatory Visit: Payer: Commercial Managed Care - HMO

## 2016-05-22 ENCOUNTER — Other Ambulatory Visit: Payer: Self-pay | Admitting: General Surgery

## 2016-05-22 ENCOUNTER — Ambulatory Visit
Admission: RE | Admit: 2016-05-22 | Discharge: 2016-05-22 | Disposition: A | Discharge status: Home / Self Care | Payer: Commercial Managed Care - HMO | Source: Ambulatory Visit | Attending: Radiation Oncology | Admitting: Radiation Oncology

## 2016-05-22 ENCOUNTER — Ambulatory Visit: Payer: Commercial Managed Care - HMO | Attending: General Surgery | Admitting: Physical Therapy

## 2016-05-22 ENCOUNTER — Other Ambulatory Visit (HOSPITAL_COMMUNITY): Payer: Commercial Managed Care - HMO

## 2016-05-22 ENCOUNTER — Other Ambulatory Visit (HOSPITAL_BASED_OUTPATIENT_CLINIC_OR_DEPARTMENT_OTHER): Payer: Commercial Managed Care - HMO

## 2016-05-22 ENCOUNTER — Ambulatory Visit (HOSPITAL_BASED_OUTPATIENT_CLINIC_OR_DEPARTMENT_OTHER): Payer: Commercial Managed Care - HMO | Admitting: Hematology and Oncology

## 2016-05-22 DIAGNOSIS — G4730 Sleep apnea, unspecified: Secondary | ICD-10-CM | POA: Diagnosis not present

## 2016-05-22 DIAGNOSIS — Z170 Estrogen receptor positive status [ER+]: Secondary | ICD-10-CM | POA: Diagnosis not present

## 2016-05-22 DIAGNOSIS — C50412 Malignant neoplasm of upper-outer quadrant of left female breast: Secondary | ICD-10-CM

## 2016-05-22 DIAGNOSIS — Z800 Family history of malignant neoplasm of digestive organs: Secondary | ICD-10-CM

## 2016-05-22 DIAGNOSIS — E040 Nontoxic diffuse goiter: Secondary | ICD-10-CM | POA: Diagnosis not present

## 2016-05-22 DIAGNOSIS — I110 Hypertensive heart disease with heart failure: Secondary | ICD-10-CM | POA: Diagnosis not present

## 2016-05-22 DIAGNOSIS — M1711 Unilateral primary osteoarthritis, right knee: Secondary | ICD-10-CM | POA: Diagnosis not present

## 2016-05-22 DIAGNOSIS — I10 Essential (primary) hypertension: Secondary | ICD-10-CM | POA: Diagnosis not present

## 2016-05-22 DIAGNOSIS — R293 Abnormal posture: Secondary | ICD-10-CM | POA: Diagnosis not present

## 2016-05-22 DIAGNOSIS — Z9049 Acquired absence of other specified parts of digestive tract: Secondary | ICD-10-CM | POA: Diagnosis not present

## 2016-05-22 DIAGNOSIS — J45909 Unspecified asthma, uncomplicated: Secondary | ICD-10-CM | POA: Diagnosis not present

## 2016-05-22 LAB — COMPREHENSIVE METABOLIC PANEL
ALBUMIN: 3.5 g/dL (ref 3.5–5.0)
ALK PHOS: 110 U/L (ref 40–150)
ALT: 39 U/L (ref 0–55)
ANION GAP: 10 meq/L (ref 3–11)
AST: 49 U/L — ABNORMAL HIGH (ref 5–34)
BILIRUBIN TOTAL: 0.67 mg/dL (ref 0.20–1.20)
BUN: 17.6 mg/dL (ref 7.0–26.0)
CALCIUM: 9.7 mg/dL (ref 8.4–10.4)
CO2: 27 mEq/L (ref 22–29)
Chloride: 103 mEq/L (ref 98–109)
Creatinine: 1 mg/dL (ref 0.6–1.1)
EGFR: 62 mL/min/{1.73_m2} — AB (ref >90)
Glucose: 128 mg/dl (ref 70–140)
Potassium: 4.3 mEq/L (ref 3.5–5.1)
Sodium: 140 mEq/L (ref 136–145)
TOTAL PROTEIN: 7.9 g/dL (ref 6.4–8.3)

## 2016-05-22 LAB — CBC WITH DIFFERENTIAL/PLATELET
BASO%: 0.3 % (ref 0.0–2.0)
Basophils Absolute: 0 10*3/uL (ref 0.0–0.1)
EOS ABS: 0.1 10*3/uL (ref 0.0–0.5)
EOS%: 0.7 % (ref 0.0–7.0)
HCT: 43.1 % (ref 34.8–46.6)
HEMOGLOBIN: 13.9 g/dL (ref 11.6–15.9)
LYMPH%: 21 % (ref 14.0–49.7)
MCH: 27.7 pg (ref 25.1–34.0)
MCHC: 32.3 g/dL (ref 31.5–36.0)
MCV: 85.9 fL (ref 79.5–101.0)
MONO#: 0.4 10*3/uL (ref 0.1–0.9)
MONO%: 4.8 % (ref 0.0–14.0)
NEUT%: 73.2 % (ref 38.4–76.8)
NEUTROS ABS: 6.2 10*3/uL (ref 1.5–6.5)
PLATELETS: 284 10*3/uL (ref 145–400)
RBC: 5.02 10*6/uL (ref 3.70–5.45)
RDW: 15 % — ABNORMAL HIGH (ref 11.2–14.5)
WBC: 8.4 10*3/uL (ref 3.9–10.3)
lymph#: 1.8 10*3/uL (ref 0.9–3.3)

## 2016-05-22 NOTE — Progress Notes (Signed)
[place] NOTE  Patient Care Team: [name], MD as PCP - General (Family Medicine) Fanny Skates, MD as Consulting Physician (General Surgery) Nicholas Lose, MD as Consulting Physician (Hematology and Oncology) Gery Pray, MD as Consulting Physician (Radiation Oncology)  CHIEF COMPLAINTS/PURPOSE OF CONSULTATION:  Newly diagnosed breast cancer  HISTORY OF PRESENTING ILLNESS:  [name] 65 y.o. female is here because of recent diagnosis of left breast invasive ductal carcinoma. Patient had a screening mammogram the detected a left breast mass at 3:00 position measuring 9 mm in size. Axilla was negative. Ultrasound guided biopsy revealed a grade 3 invasive ductal carcinoma that was ER moderate distending the 30% PR 0% Ki-67 40% and HER-2 was positive. She was presented at the multidisciplinary tumor board and she is here today accompanied by her family to discuss the treatment plan.  I reviewed her records extensively and collaborated the history with the patient.  SUMMARY OF ONCOLOGIC HISTORY:   Malignant neoplasm of upper-outer quadrant of left female breast ([place])   05/13/2016 Initial Diagnosis    Left breast biopsy 3:00: IDC, grade 3, ER 30%, PR 0%, Ki-67 40%, HER-2 positive ratio 2.71, screening detected left breast mass 9 x 7 x 6 mm, axilla negative, T1 BN 0 stage IA clinical stage       In terms of breast cancer risk profile:  She menarched at early age of 73 and went to menopause at age 14  She had 3 pregnancy, her first child was born at age 30  She has received birth control pills for approximately 10 years.  She was never exposed to fertility medications or hormone replacement therapy.  She has  family history of Breast/GYN/GI cancer Maternal grandmother and maternal aunt and 28s with colon cancer  MEDICAL HISTORY:  Past Medical History:  Diagnosis Date  . Anemia 04/05/2012  . Anxiety   . Anxiety state 09/11/2007   Qualifier:  Diagnosis of  By: Scherrie Gerlach    . Atrial tachycardia, paroxysmal ([place])   . Back pain 10/02/2014  . Chicken pox as a child  . Chronic diastolic CHF (congestive heart failure), NYHA class 1 ([place])   . Complication of anesthesia    pt states feels different in her body after anesthesia when waking up and also experiences a smell of burnt plastic for approx a wk   . Elevated LFTs 04/05/2012  . GERD (gastroesophageal reflux disease)   . Heart murmur    hx of one at birth   . Hx of colonic polyps   . Hyperlipidemia   . Hypertension   . Kidney stones    cyst on kidney Dr Dorina Hoyer  . Malignant neoplasm of upper-outer quadrant of left female breast ([place]) 05/14/2016  . Measles as a child  . Mumps as a child  . OA (osteoarthritis) of knee 04/05/2012  . Obesity 07/11/2014  . PONV (postoperative nausea and vomiting)   . Preventative health care 09/05/2013  . PVC's (premature ventricular contractions) 05/02/2016  . Shortness of breath dyspnea    walking distances / climbing stairs  . Sleep apnea 04/05/2012  . Tinea corporis 02/23/2013  . Vasomotor rhinitis 04/05/2012  . Ventral hernia     SURGICAL HISTORY: Past Surgical History:  Procedure Laterality Date  . CARDIAC CATHETERIZATION     normal coroary arteries per patient  . CARDIOVASCULAR STRESS TEST     10/12/2013  . CESAREAN SECTION     X 3  . CHOLECYSTECTOMY    .  COLONOSCOPY WITH PROPOFOL N/A 03/28/2015   Procedure: COLONOSCOPY WITH PROPOFOL;  Surgeon: Juanita Craver, MD;  Location: [facility];  Service: Endoscopy;  Laterality: N/A;  . HERNIA REPAIR  02/08/11   ventral hernia  . JOINT REPLACEMENT     bilateral  . KNEE ARTHROSCOPY  dec 2011   right  . LEFT AND RIGHT HEART CATHETERIZATION WITH CORONARY ANGIOGRAM N/A 11/08/2013   Procedure: LEFT AND RIGHT HEART CATHETERIZATION WITH CORONARY ANGIOGRAM;  Surgeon: [name], MD;  Location: [facility] CATH LAB;  Service: Cardiovascular;  Laterality: N/A;  . MENISCUS REPAIR  2009  .  MOUTH SURGERY     teeth implants  . TOTAL KNEE ARTHROPLASTY  2011   left  . TOTAL KNEE ARTHROPLASTY Right 05/10/2014   Procedure: RIGHT TOTAL KNEE ARTHROPLASTY;  Surgeon: Mauri Pole, MD;  Location: [facility];  Service: Orthopedics;  Laterality: Right;  . TUBAL LIGATION    . WISDOM TOOTH EXTRACTION  2000    SOCIAL HISTORY: Social History   Social History  . Marital status: Divorced    Spouse name: N/A  . Number of children: N/A  . Years of education: N/A   Occupational History  . Not on file.   Social History Main Topics  . Smoking status: Never Smoker  . Smokeless tobacco: Never Used  . Alcohol use Yes  . Drug use: No  . Sexual activity: No     Comment: lives with mother currently-stress   Other Topics Concern  . Not on file   Social History Narrative  . No narrative on file    FAMILY HISTORY: Family History  Problem Relation Age of Onset  . Thyroid disease Mother   . Hyperlipidemia Mother   . Heart attack Father 35  . Hypertension Father   . Arthritis Father     RA  . Coronary artery disease Father   . Coronary artery disease Brother   . Heart disease Brother   . Cancer Maternal Aunt     colon  . Cancer Maternal Grandmother     colon  . Heart attack Maternal Grandfather   . Alcohol abuse Paternal Grandfather     ALLERGIES:  has No Known Allergies.  MEDICATIONS:  Current Outpatient Prescriptions  Medication Sig Dispense Refill  . aspirin EC 81 MG tablet Take 81 mg by mouth daily.    . diclofenac (VOLTAREN) 75 MG EC tablet Take 1 tablet (75 mg total) by mouth 2 (two) times daily. 180 tablet 1  . diltiazem (CARDIZEM CD) 120 MG 24 hr capsule Take 1 capsule (120 mg total) by mouth daily. 90 capsule 2  . escitalopram (LEXAPRO) 20 MG tablet TAKE 1 TABLET ONE TIME DAILY 90 tablet 4  . esomeprazole (NEXIUM) 40 MG capsule Take 40 mg by mouth daily at 12 noon.    . furosemide (LASIX) 20 MG tablet Take 1 tablet (20 mg total) by mouth daily. 90 tablet 2  .  losartan (COZAAR) 100 MG tablet Take 1 tablet (100 mg total) by mouth daily. 90 tablet 2  . montelukast (SINGULAIR) 10 MG tablet Take 1 tablet (10 mg total) by mouth at bedtime. 90 tablet 2  . nebivolol (BYSTOLIC) 10 MG tablet Take 1 tablet (10 mg total) by mouth at bedtime. 90 tablet 2  . rosuvastatin (CRESTOR) 20 MG tablet Take 1 tablet (20 mg total) by mouth at bedtime. -. 90 tablet 2  . albuterol (PROVENTIL HFA;VENTOLIN HFA) 108 (90 Base) MCG/ACT inhaler Inhale 2 puffs into the lungs  every 6 (six) hours as needed for wheezing or shortness of breath. (Patient not taking: Reported on 05/22/2016) 1 Inhaler 6  . beclomethasone (QVAR) 80 MCG/ACT inhaler Inhale 1-2 puffs into the lungs 2 (two) times daily. (Patient not taking: Reported on 05/22/2016) 1 Inhaler 5  . clotrimazole-betamethasone (LOTRISONE) cream Apply 1 application topically 2 (two) times daily as needed (for rash). (Patient not taking: Reported on 05/22/2016) 30 g 0  . cycloSPORINE (RESTASIS) 0.05 % ophthalmic emulsion 1 drop 2 (two) times daily.    . fexofenadine (ALLEGRA) 30 MG tablet Take 30 mg by mouth daily.    . fluticasone (FLONASE) 50 MCG/ACT nasal spray Place 1 spray into both nostrils daily as needed for allergies. (Patient not taking: Reported on 05/22/2016) 48 g 1  . pantoprazole (PROTONIX) 40 MG tablet Take 40 mg by mouth daily.     No current facility-administered medications for this visit.     REVIEW OF SYSTEMS:   Constitutional: Denies fevers, chills or abnormal night sweats Eyes: Denies blurriness of vision, double vision or watery eyes Ears, nose, mouth, throat, and face: Denies mucositis or sore throat Respiratory: Denies cough, dyspnea or wheezes Cardiovascular: Denies palpitation, chest discomfort or lower extremity swelling Gastrointestinal:  Denies nausea, heartburn or change in bowel habits Skin: Denies abnormal skin rashes Lymphatics: Denies new lymphadenopathy or easy bruising Neurological:Denies  numbness, tingling or new weaknesses Behavioral/Psych: Mood is stable, no new changes  Breast:  Denies any palpable lumps or discharge All other systems were reviewed with the patient and are negative.  PHYSICAL EXAMINATION: ECOG PERFORMANCE STATUS: 0 - Asymptomatic  Vitals:   05/22/16 1232  BP: (!) 168/92  Pulse: 69  Resp: 18  Temp: 98.9 F (37.2 C)   Filed Weights   05/22/16 1232  Weight: (!) 324 lb 6.4 oz (147.1 kg)    GENERAL:alert, no distress and comfortable SKIN: skin color, texture, turgor are normal, no rashes or significant lesions EYES: normal, conjunctiva are pink and non-injected, sclera clear OROPHARYNX:no exudate, no erythema and lips, buccal mucosa, and tongue normal  NECK: supple, thyroid normal size, non-tender, without nodularity LYMPH:  no palpable lymphadenopathy in the cervical, axillary or inguinal LUNGS: clear to auscultation and percussion with normal breathing effort HEART: regular rate & rhythm and no murmurs and no lower extremity edema ABDOMEN:abdomen soft, non-tender and normal bowel sounds Musculoskeletal:no cyanosis of digits and no clubbing  PSYCH: alert & oriented x 3 with fluent speech NEURO: no focal motor/sensory deficits BREAST: No palpable nodules in breast. No palpable axillary or supraclavicular lymphadenopathy (exam performed in the presence of a chaperone)   LABORATORY DATA:  I have reviewed the data as listed Lab Results  Component Value Date   WBC 8.4 05/22/2016   HGB 13.9 05/22/2016   HCT 43.1 05/22/2016   MCV 85.9 05/22/2016   PLT 284 05/22/2016   Lab Results  Component Value Date   NA 140 05/22/2016   K 4.3 05/22/2016   CL 103 05/02/2016   CO2 27 05/22/2016    RADIOGRAPHIC STUDIES: I have personally reviewed the radiological reports and agreed with the findings in the report.  ASSESSMENT AND PLAN:  Malignant neoplasm of upper-outer quadrant of left female breast ([place]) 05/13/2016: Left breast biopsy 3:00: IDC,  grade 3, ER 30%, PR 0%, Ki-67 40%, HER-2 positive ratio 2.71, screening detected left breast mass 9 x 7 x 6 mm, axilla negative, T1 BN 0 stage IA clinical stage Patient has multiple comorbidities including history of LVH with congestive  heart failure  Pathology and radiology counseling: Discussed with the patient, the details of pathology including the type of breast cancer,the clinical staging, the significance of ER, PR and HER-2/neu receptors and the implications for treatment. After reviewing the pathology in detail, we proceeded to discuss the different treatment options between surgery, radiation, chemotherapy, antiestrogen therapies.  Recommendation based on multidisciplinary tumor board: 1. Breast conserving surgery 2  consideration for adjuvant Taxol with Herceptin if her cardiologist allows [name] to treat her with anti-HER-2 therapy this is only if the final tumor is greater than 5 mm in size. 3. Followed by adjuvant radiation 4. Followed by adjuvant antiestrogen therapy with anastrozole 1 mg daily 5 years (bone density 04/29/2016 T score -1.2) ---------------------------------------------------------------------------------------------- Return to clinic after surgery to discuss the final pathology report. Patient will not undergo port placement until we get more clarity on the final tumor size and cardiologist clearance.    All questions were answered. The patient knows to call the clinic with any problems, questions or concerns.    Rulon Eisenmenger, MD 05/22/16

## 2016-05-22 NOTE — Progress Notes (Signed)
Nutrition Assessment  Reason for Assessment:  Pt seen in Breast Clinic  ASSESSMENT:   65 year old female with new diagnosis of left breast mass. Past medical history of CHF, HTN, CAD, HLD  Patient reports normal intake.  Medications:  reviewed  Labs: reviewed  Anthropometrics:   Height: 65 inches Weight: 324 lb BMI: 54.1   NUTRITION DIAGNOSIS: Food and nutrition related knowledge deficit related to new diagnosis of breast cancer as evidenced by no prior need for nutrition related information.  INTERVENTION:    Discussed and provided packet of information regarding nutritional tips for breast cancer patients.  Questions answered.  Teachback method used.      MONITORING, EVALUATION, and GOAL: Pt will consume a healthy plant based diet to maintain lean body mass throughout treatment.   Delaynie Stetzer B. Zenia Resides, [place], [place] (pager)

## 2016-05-22 NOTE — Therapy (Signed)
[place], [place], [address] Phone: [phone]   Fax:  [phone]  Physical Therapy Evaluation  Patient Details  Name: [name] MRN: [id number] Date of Birth: Sep 14, 1950 Referring Provider: Dr. Fanny Skates  Encounter Date: 05/22/2016      PT End of Session - 05/22/16 1720    Visit Number 1   Number of Visits 1   PT Start Time 6861   PT Stop Time 6837  Also saw pt from [phone] for a total of 27 minutes   PT Time Calculation (min) 19 min   Activity Tolerance Patient tolerated treatment well   Behavior During Therapy [facility] for tasks assessed/performed      Past Medical History:  Diagnosis Date  . Anemia 04/05/2012  . Anxiety   . Anxiety state 09/11/2007   Qualifier: Diagnosis of  By: Scherrie Gerlach    . Atrial tachycardia, paroxysmal ([place])   . Back pain 10/02/2014  . Chicken pox as a child  . Chronic diastolic CHF (congestive heart failure), NYHA class 1 ([place])   . Complication of anesthesia    pt states feels different in her body after anesthesia when waking up and also experiences a smell of burnt plastic for approx a wk   . Elevated LFTs 04/05/2012  . GERD (gastroesophageal reflux disease)   . Heart murmur    hx of one at birth   . Hx of colonic polyps   . Hyperlipidemia   . Hypertension   . Kidney stones    cyst on kidney Dr Dorina Hoyer  . Malignant neoplasm of upper-outer quadrant of left female breast ([place]) 05/14/2016  . Measles as a child  . Mumps as a child  . OA (osteoarthritis) of knee 04/05/2012  . Obesity 07/11/2014  . PONV (postoperative nausea and vomiting)   . Preventative health care 09/05/2013  . PVC's (premature ventricular contractions) 05/02/2016  . Shortness of breath dyspnea    walking distances / climbing stairs  . Sleep apnea 04/05/2012  . Tinea corporis 02/23/2013  . Vasomotor rhinitis 04/05/2012  . Ventral hernia     Past Surgical History:   Procedure Laterality Date  . CARDIAC CATHETERIZATION     normal coroary arteries per patient  . CARDIOVASCULAR STRESS TEST     10/12/2013  . CESAREAN SECTION     X 3  . CHOLECYSTECTOMY    . COLONOSCOPY WITH PROPOFOL N/A 03/28/2015   Procedure: COLONOSCOPY WITH PROPOFOL;  Surgeon: Juanita Craver, MD;  Location: [facility] ENDOSCOPY;  Service: Endoscopy;  Laterality: N/A;  . HERNIA REPAIR  02/08/11   ventral hernia  . JOINT REPLACEMENT     bilateral  . KNEE ARTHROSCOPY  dec 2011   right  . LEFT AND RIGHT HEART CATHETERIZATION WITH CORONARY ANGIOGRAM N/A 11/08/2013   Procedure: LEFT AND RIGHT HEART CATHETERIZATION WITH CORONARY ANGIOGRAM;  Surgeon: [name], MD;  [facility]: [facility] CATH LAB;  Service: Cardiovascular;  Laterality: N/A;  . MENISCUS REPAIR  2009  . MOUTH SURGERY     teeth implants  . TOTAL KNEE ARTHROPLASTY  2011   left  . TOTAL KNEE ARTHROPLASTY Right 05/10/2014   Procedure: RIGHT TOTAL KNEE ARTHROPLASTY;  Surgeon: Mauri Pole, MD;  Location: [facility] ORS;  Service: Orthopedics;  Laterality: Right;  . TUBAL LIGATION    . WISDOM TOOTH EXTRACTION  2000    There were no vitals filed for this visit.       Subjective Assessment - 05/22/16  1709    Subjective Patient reports she is here today to be seen by her medical team for her nely diagnosed left breast cancer.   Patient is accompained by: Family member   Pertinent History Patient was diagnosed on 04/29/16 with left grade 3 invasive ductal carcinoma breast cancer.  It is located in the upper inner quadrant and measures 9 mm.  It is ER positive, PR negative, and HER2 positive.    Patient Stated Goals Reduce lymphedema risk and learn post op shoulder ROM HEP   Currently in Pain? No/denies            [facility] PT Assessment - 05/22/16 0001      Assessment   Medical Diagnosis Left breast cancer   Referring Provider Dr. Fanny Skates   Onset Date/Surgical Date 04/29/16   Hand Dominance Right   Prior Therapy none      Precautions   Precautions Other (comment)   Precaution Comments active breast cancer     Restrictions   Weight Bearing Restrictions No     Balance Screen   Has the patient fallen in the past 6 months No   Has the patient had a decrease in activity level because of a fear of falling?  No   Is the patient reluctant to leave their home because of a fear of falling?  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 and in the [place] She does not exercise     Cognition   Overall Cognitive Status Within Functional Limits for tasks assessed     Posture/Postural Control   Posture/Postural Control Postural limitations   Postural Limitations Rounded Shoulders;Forward head     ROM / Strength   AROM / PROM / Strength AROM;Strength     AROM   AROM Assessment Site Shoulder;Cervical   Right/Left Shoulder Right;Left   Right Shoulder Extension 51 Degrees   Right Shoulder Flexion 142 Degrees   Right Shoulder ABduction 149 Degrees   Right Shoulder Internal Rotation 60 Degrees   Right Shoulder External Rotation 82 Degrees   Left Shoulder Extension 42 Degrees   Left Shoulder Flexion 135 Degrees   Left Shoulder ABduction 128 Degrees   Left Shoulder Internal Rotation 78 Degrees   Left Shoulder External Rotation 80 Degrees   Cervical Flexion WNL   Cervical Extension WNL   Cervical - Right Side Bend WNL   Cervical - Left Side Bend WNL   Cervical - Right Rotation WNL   Cervical - Left Rotation WNL     Strength   Overall Strength Within functional limits for tasks performed           LYMPHEDEMA/ONCOLOGY QUESTIONNAIRE - 05/22/16 1718      Type   Cancer Type Left breast cancer     Lymphedema Assessments   Lymphedema Assessments  Upper extremities     Right Upper Extremity Lymphedema   10 cm Proximal to Olecranon Process 39.3 cm   Olecranon Process 33.2 cm   10 cm Proximal to Ulnar Styloid Process 31.7 cm   Just Proximal to Ulnar Styloid Process 24.1 cm   Across Hand at [employer] 21.7 cm   At [place] of 2nd Digit 7.3 cm  Left Upper Extremity Lymphedema   10 cm Proximal to Olecranon Process 38.6 cm   Olecranon Process 33.6 cm   10 cm Proximal to Ulnar Styloid Process 33 cm   Just Proximal to Ulnar Styloid Process 24.1 cm   Across Hand at [employer] 23.3 cm   At [place] of 2nd Digit 7.2 cm              Patient was instructed today in a home exercise program today for post op shoulder range of motion. These included active assist shoulder flexion in sitting, scapular retraction, wall walking with shoulder abduction, and hands behind head external rotation.  She was encouraged to do these twice a day, holding 3 seconds and repeating 5 times when permitted by her physician.              PT Education - 05/22/16 1719    Education provided Yes   Education Details Lymphedema risk reduction and post op shoulder ROM HEP   Person(s) Educated Patient   Methods Explanation;Demonstration;Handout   Comprehension Returned demonstration;Verbalized understanding              Breast Clinic Goals - 05/22/16 1722      Patient will be able to verbalize understanding of pertinent lymphedema risk reduction practices relevant to her diagnosis specifically related to skin care.   Time 1   Period Days   Status Achieved     Patient will be able to return demonstrate and/or verbalize understanding of the post-op home exercise program related to regaining shoulder range of motion.   Time 1   Period Days   Status Achieved     Patient will be able to verbalize understanding of the importance of attending the postoperative After Breast Cancer Class for further lymphedema risk reduction education and  therapeutic exercise.   Time 1   Period Days   Status Achieved              Plan - 05/22/16 1720    Clinical Impression Statement Patient was diagnosed on 04/29/16 with left grade 3 invasive ductal carcinoma breast cancer.  It is located in the upper inner quadrant and measures 9 mm.  It is ER positive, PR negative, and HER2 positive.  Her multidisciplinary medical team met prior to her assessments to detemine a recommended treatment plan.  She is planning to have a left lumpectomy and sentinel node biopsy followed by radiation and anti-estrogen therapy.  She may benefit from post op PT to regain shoulder ROM and reduce lymphedema risk.  Due to her lack of comorbidities, her eval is of low complexity.   Rehab Potential Excellent   Clinical Impairments Affecting Rehab Potential none   PT Frequency One time visit   PT Treatment/Interventions Patient/family education;Therapeutic exercise   PT Next Visit Plan Will f.u after [place] to determine PT needs   PT Home Exercise Plan Post op shoulder ROM HEP   Consulted and Agree with Plan of Care Patient;Family member/caregiver   Family Member Consulted daughter      Patient will benefit from skilled therapeutic intervention in order to improve the following deficits and impairments:  Postural dysfunction, Decreased knowledge of precautions, Pain, Impaired UE functional use, Decreased range of motion  Visit Diagnosis: Carcinoma of upper-outer quadrant of left breast in female, estrogen receptor positive ([place]) - Plan: PT plan of care cert/re-cert  Abnormal posture - Plan: PT plan of care cert/re-cert      G-Codes - 79/15/05 1722  Functional Assessment Tool Used Clinical Judgement   Functional Limitation Other PT primary   Other PT Primary Current Status (J0929) At least 1 percent but less than 20 percent impaired, limited or restricted   Other PT Primary Goal Status (V7473) At least 1 percent but less than 20 percent impaired, limited or  restricted   Other PT Primary Discharge Status (U0370) At least 1 percent but less than 20 percent impaired, limited or restricted     Patient will follow up at outpatient cancer rehab if needed following surgery.  If the patient requires physical therapy at that time, a specific plan will be dictated and sent to the referring physician for approval. The patient was educated today on appropriate basic range of motion exercises to begin post operatively and the importance of attending the After Breast Cancer class following surgery.  Patient was educated today on lymphedema risk reduction practices as it pertains to recommendations that will benefit the patient immediately following surgery.  She verbalized good understanding.  No additional physical therapy is indicated at this time.     Problem List Patient Active Problem List   Diagnosis Date Noted  . Malignant neoplasm of upper-outer quadrant of left female breast ([place]) 05/14/2016  . PVC's (premature ventricular contractions) 05/02/2016  . Neck mass 04/21/2016  . LVH (left ventricular hypertrophy) due to hypertensive disease, with heart failure ([place]) 04/18/2016  . Tonsillar hypertrophy 07/07/2015  . Back pain 10/02/2014  . Obesity 07/11/2014  . S/P right TKA 05/10/2014  . S/P knee replacement 05/10/2014  . Atrial tachycardia, paroxysmal ([place]) 02/22/2014  . Hyperglycemia 12/31/2013  . Acute sinusitis with symptoms > 10 days 12/20/2013  . Chronic diastolic CHF (congestive heart failure) ([place]) 11/22/2013  . CAD (coronary artery disease), native coronary artery 11/22/2013  . SOB (shortness of breath) 11/03/2013  . Undiagnosed cardiac murmurs 09/05/2013  . Preventative health care 09/05/2013  . Tinea corporis 02/23/2013  . Musculoskeletal pain 11/10/2012  . Vasomotor rhinitis 04/05/2012  . Obstructive sleep apnea 04/05/2012  . Anemia 04/05/2012  . Elevated LFTs 04/05/2012  . OA (osteoarthritis) of knee 04/05/2012  . Hernia 10/13/2010   . Hyperlipidemia 09/11/2007  . Anxiety and depression 09/11/2007  . Essential hypertension 09/11/2007  . GERD 09/11/2007  . RENAL CALCULUS 09/11/2007  . RENAL CYST 09/11/2007  . COLONIC POLYPS, HX OF 09/11/2007   Annia Friendly, PT 05/22/16 5:24 PM  [place], [place], [address] Phone: [phone]   Fax:  [phone]  Name: [name] MRN: [id number] Date of Birth: 07/19/1950

## 2016-05-22 NOTE — Progress Notes (Signed)
ONCBCN DISTRESS SCREENING 05/22/2016  Screening Type Initial Screening  Distress experienced in past week (1-10) 5  Practical problem type Insurance  Family Problem type Children  Emotional problem type Nervousness/Anxiety;Adjusting to illness  Spiritual/Religous concerns type Facing my mortality  Information Concerns Type Lack of info about treatment  Physical Problem type Sleep/insomnia;Breathing;Skin dry/itchy  Referral to support programs Yes   Patient attended Breast Multidisciplinary Clinic. Counselor was not able to meet with patient before she left but packet of support center resources were given to patient.  The patient scored a 5 on the Psychosocial Distress Thermometer which indicates moderate distress.   Rosana Fret, Counseling Intern Supervisor, Lorrin Jackson, lead Chaplain

## 2016-05-22 NOTE — Patient Instructions (Signed)

## 2016-05-22 NOTE — Assessment & Plan Note (Signed)
05/13/2016: Left breast biopsy 3:00: IDC, grade 3, ER 30%, PR 0%, Ki-67 40%, HER-2 positive ratio 2.71, screening detected left breast mass 9 x 7 x 6 mm, axilla negative, T1 BN 0 stage IA clinical stage Patient has multiple comorbidities including history of LVH with congestive heart failure  Pathology and radiology counseling: Discussed with the patient, the details of pathology including the type of breast cancer,the clinical staging, the significance of ER, PR and HER-2/neu receptors and the implications for treatment. After reviewing the pathology in detail, we proceeded to discuss the different treatment options between surgery, radiation, chemotherapy, antiestrogen therapies.  Recommendation based on multidisciplinary tumor board: 1. Breast conserving surgery 2  consideration for adjuvant Taxol with Herceptin if her cardiologist allows [name] to treat her with anti-HER-2 therapy this is only if the final tumor is greater than 5 mm in size. 3. Followed by adjuvant radiation 4. Followed by adjuvant antiestrogen therapy with anastrozole 1 mg daily 5 years (bone density 04/29/2016 T score -1.2) ---------------------------------------------------------------------------------------------- Return to clinic after surgery to discuss the final pathology report. Patient will not undergo port placement until we get more clarity on the final tumor size and cardiologist clearance.

## 2016-05-22 NOTE — Progress Notes (Signed)
Radiation Oncology         [phone] ________________________________  Initial Outpatient Consultation  Name: [name] MRN: [id number]  Date: 05/22/2016  DOB: [birth year]  PN:TIRWE, Erline Levine, MD  Fanny Skates, MD   REFERRING PHYSICIAN: Fanny Skates, MD   CHIEF COMPLAINTS/PURPOSE OF CONSULTATION:  Newly diagnosed breast cancer  DIAGNOSIS: The encounter diagnosis was Malignant neoplasm of upper-outer quadrant of left breast in female, estrogen receptor positive ([place]).   Clinical T1bNxMx, grade 3, invasive ductal carcinoma of the left breast (ER+, PR-, HER2+)  HISTORY OF PRESENT ILLNESS::[name] is a 65 y.o. female who had a screening mammogram on 04/29/16 showing a possible left breast mass.  Diagnostic left breast mammogram on 05/08/16 showed a 0.8 cm mass in the 3:00 position posteriorly. No mass was palpable on physical exam. Ultrasound showed a 0.9 x 0.7 x 0.6 cm mass in the 3:00 position 14 cm from the nipple. US of the left axilla was negative for adenopathy.  Biopsy of the mass revealed grade 3 invasive ductal carcinoma (ER 30% positive, PR 0% negative, HER2 positive, Ki67 40%).  The patient presents today in multidisciplinary breast clinic to discuss treatment options for the management of her disease.  Gynecologic History  Age at first menstrual period? 12  Are you still having periods? No Approximate date of last period? 65 years old  If you no longer have periods: Have you used hormone replacement? No Obstetric History:  How many children have you carried to term? 3 Your age at first live birth? 24  Pregnant now or trying to get pregnant? No  Have you used birth control pills or hormone shots for contraception? Yes  If so, for how long (or approximate dates)? 10 years  Would you be interested in learning more about the options to preserve fertility? No Health Maintenance:  Have you ever had a colonoscopy? Yes If yes, date? Records w/ Dr. Verdia Kuba  Have you ever had a bone density? Yes If yes, date? This month  Date of your last PAP smear? 2 years ago  PREVIOUS RADIATION THERAPY: No  PAST MEDICAL HISTORY:  has a past medical history of Anemia (04/05/2012); Anxiety; Anxiety state (09/11/2007); Atrial tachycardia, paroxysmal ([place]); Back pain (10/02/2014); Chicken pox (as a child); Chronic diastolic CHF (congestive heart failure), NYHA class 1 ([place]); Complication of anesthesia; Elevated LFTs (04/05/2012); GERD (gastroesophageal reflux disease); Heart murmur; colonic polyps; Hyperlipidemia; Hypertension; Kidney stones; Malignant neoplasm of upper-outer quadrant of left female breast ([place]) (05/14/2016); Measles (as a child); Mumps (as a child); OA (osteoarthritis) of knee (04/05/2012); Obesity (07/11/2014); PONV (postoperative nausea and vomiting); Preventative health care (09/05/2013); PVC's (premature ventricular contractions) (05/02/2016); Shortness of breath dyspnea; Sleep apnea (04/05/2012); Tinea corporis (02/23/2013); Vasomotor rhinitis (04/05/2012); and Ventral hernia.    PAST SURGICAL HISTORY: Past Surgical History:  Procedure Laterality Date  . CARDIAC CATHETERIZATION     normal coroary arteries per patient  . CARDIOVASCULAR STRESS TEST     10/12/2013  . CESAREAN SECTION     X 3  . CHOLECYSTECTOMY    . COLONOSCOPY WITH PROPOFOL N/A 03/28/2015   Procedure: COLONOSCOPY WITH PROPOFOL;  Surgeon: Juanita Craver, MD;  Location: [facility];  Service: Endoscopy;  Laterality: N/A;  . HERNIA REPAIR  02/08/11   ventral hernia  . JOINT REPLACEMENT     bilateral  . KNEE ARTHROSCOPY  dec 2011   right  . LEFT AND RIGHT HEART CATHETERIZATION WITH CORONARY ANGIOGRAM N/A 11/08/2013   Procedure: LEFT AND  RIGHT HEART CATHETERIZATION WITH CORONARY ANGIOGRAM;  Surgeon: Christopher D McAlhany, MD;  Location: [facility];  Service: Cardiovascular;  Laterality: N/A;  . MENISCUS REPAIR  2009  . MOUTH SURGERY     teeth implants  . TOTAL KNEE ARTHROPLASTY   2011   left  . TOTAL KNEE ARTHROPLASTY Right 05/10/2014   Procedure: RIGHT TOTAL KNEE ARTHROPLASTY;  Surgeon: Matthew D Olin, MD;  Location: [facility];  Service: Orthopedics;  Laterality: Right;  . TUBAL LIGATION    . WISDOM TOOTH EXTRACTION  2000    FAMILY HISTORY: family history includes Alcohol abuse in her paternal grandfather; Arthritis in her father; Cancer in her maternal aunt and maternal grandmother; Coronary artery disease in her brother and father; Heart attack in her maternal grandfather; Heart attack (age of onset: 69) in her father; Heart disease in her brother; Hyperlipidemia in her mother; Hypertension in her father; Thyroid disease in her mother.  SOCIAL HISTORY:  reports that she has never smoked. She has never used smokeless tobacco. She reports that she drinks alcohol. She reports that she does not use drugs.  ALLERGIES: Patient has no known allergies.  MEDICATIONS:  Current Outpatient Prescriptions  Medication Sig Dispense Refill  . albuterol (PROVENTIL HFA;VENTOLIN HFA) 108 (90 Base) MCG/ACT inhaler Inhale 2 puffs into the lungs every 6 (six) hours as needed for wheezing or shortness of breath. (Patient not taking: Reported on 05/22/2016) 1 Inhaler 6  . aspirin EC 81 MG tablet Take 81 mg by mouth daily.    . beclomethasone (QVAR) 80 MCG/ACT inhaler Inhale 1-2 puffs into the lungs 2 (two) times daily. (Patient not taking: Reported on 05/22/2016) 1 Inhaler 5  . clotrimazole-betamethasone (LOTRISONE) cream Apply 1 application topically 2 (two) times daily as needed (for rash). (Patient not taking: Reported on 05/22/2016) 30 g 0  . cycloSPORINE (RESTASIS) 0.05 % ophthalmic emulsion 1 drop 2 (two) times daily.    . diclofenac (VOLTAREN) 75 MG EC tablet Take 1 tablet (75 mg total) by mouth 2 (two) times daily. 180 tablet 1  . diltiazem (CARDIZEM CD) 120 MG 24 hr capsule Take 1 capsule (120 mg total) by mouth daily. 90 capsule 2  . escitalopram (LEXAPRO) 20 MG tablet TAKE 1 TABLET  ONE TIME DAILY 90 tablet 4  . esomeprazole (NEXIUM) 40 MG capsule Take 40 mg by mouth daily at 12 noon.    . fexofenadine (ALLEGRA) 30 MG tablet Take 30 mg by mouth daily.    . fluticasone (FLONASE) 50 MCG/ACT nasal spray Place 1 spray into both nostrils daily as needed for allergies. (Patient not taking: Reported on 05/22/2016) 48 g 1  . furosemide (LASIX) 20 MG tablet Take 1 tablet (20 mg total) by mouth daily. 90 tablet 2  . losartan (COZAAR) 100 MG tablet Take 1 tablet (100 mg total) by mouth daily. 90 tablet 2  . montelukast (SINGULAIR) 10 MG tablet Take 1 tablet (10 mg total) by mouth at bedtime. 90 tablet 2  . nebivolol (BYSTOLIC) 10 MG tablet Take 1 tablet (10 mg total) by mouth at bedtime. 90 tablet 2  . pantoprazole (PROTONIX) 40 MG tablet Take 40 mg by mouth daily.    . rosuvastatin (CRESTOR) 20 MG tablet Take 1 tablet (20 mg total) by mouth at bedtime. -. 90 tablet 2   No current facility-administered medications for this encounter.     REVIEW OF SYSTEMS:  A 15 point review of systems is documented in the electronic medical record. This was obtained by   the nursing staff. However, I reviewed this with the patient to discuss relevant findings and make appropriate changes.  Pertinent items noted in HPI and remainder of comprehensive ROS otherwise negative.  The patient reports night sweats, fatigue, joint pain, hearing loss, sinus problems, irregular heartbeat, SOB at rest, sleeps on more than one pillow, dry cough, heartburn, hernia, dribbling urine, left breast lump, skin rash, bruise easily, back pain, arthritis, weakness, anxiety, thyroid problem, hot flashes, and anemia.  PHYSICAL EXAM:  Vitals with BMI 05/22/2016  Height 5' 5"  Weight 324 lbs 6 oz  BMI 10.1  Systolic 751  Diastolic 92  Pulse 69  Respirations 18  General: Alert and oriented, in no acute distress HEENT: Head is normocephalic. Extraocular movements are intact. Oropharynx is clear. Neck: Neck is supple, no  palpable cervical or supraclavicular lymphadenopathy. Heart: Regular in rate and rhythm with no murmurs, rubs, or gallops. Chest: Clear to auscultation bilaterally, with no rhonchi, wheezes, or rales. Abdomen: Soft, nontender, nondistended, with no rigidity or guarding. Lymphatics: see Neck Exam Skin: No concerning lesions. Psychiatric: Judgment and insight are intact. Affect is appropriate. Breast exam: Right breast no palpable mass or nipple discharge. Left breast significant bruising in the lateral aspect of the breast, no palpable mass or nipple discharge.  ECOG = 1  LABORATORY DATA:  Lab Results  Component Value Date   WBC 8.4 05/22/2016   HGB 13.9 05/22/2016   HCT 43.1 05/22/2016   MCV 85.9 05/22/2016   PLT 284 05/22/2016   NEUTROABS 6.2 05/22/2016   Lab Results  Component Value Date   NA 140 05/22/2016   K 4.3 05/22/2016   CL 103 05/02/2016   CO2 27 05/22/2016   GLUCOSE 128 05/22/2016   CREATININE 1.0 05/22/2016   CALCIUM 9.7 05/22/2016      RADIOGRAPHY: US Soft Tissue Head/neck  Result Date: 04/29/2016 CLINICAL DATA:  65 year old female with a history of right neck mass EXAM: THYROID ULTRASOUND TECHNIQUE: Ultrasound examination of the thyroid gland and adjacent soft tissues was performed. COMPARISON:  None. FINDINGS: Parenchymal Echotexture: Markedly heterogenous Estimated total number of nodules >/= 1 cm: 0 Number of spongiform nodules >/=  2 cm not described below (TR1): 0 Number of mixed cystic and solid nodules >/= 1.5 cm not described below (TR2): 0 _________________________________________________________ Isthmus: 1.3 cm Heterogeneous parenchyma, enlarged. _________________________________________________________ Right lobe: 11.1 cm x 5.2 cm x 4.6 cm Heterogeneous lobulated parenchyma with no focal nodule. _________________________________________________________ Left lobe: 9.0 cm x 3.6 cm x 6.0 cm Heterogeneous lobulated parenchyma with no focal nodule. IMPRESSION:  Enlarged multilobulated thyroid compatible with multinodular goiter and medical thyroid disease. No focal nodule identified. Signed, Dulcy Fanny. Earleen Newport, DO [facility] Electronically Signed   By: Corrie Mckusick D.O.   On: 04/29/2016 12:01   Dg Bone Density  Result Date: 04/29/2016 EXAM: DUAL X-RAY ABSORPTIOMETRY (DXA) FOR BONE MINERAL DENSITY IMPRESSION: Referring Physician:  [name] PATIENT: Name: Rebeka, Kimble Patient ID: [id number] Birth Date: May 21, 1951 Height: 65.0 in. Sex: Female Measured: 04/29/2016 Weight: 325.0 lbs. Indications: Caucasian, Estrogen Deficiency, Height Loss, Post Menopausal Fractures: Treatments: ASSESSMENT: The BMD measured at Femur Neck Left is 0.867 g/cm2 with a T-score of -1.2. This patient is considered osteopenic according to [facility]) criteria. L- 4 was excluded due to degenerative changes. Site Region Measured Date Measured Age WHO YA BMD Classification T-score AP Spine L1-L3 04/29/2016 65.4 Normal 2.1 1.428 g/cm2 DualFemur Neck Left 4/29/2016 65.4 years Osteopenia -1.2 0.867  g/cm2 World Health Organization [facility]) criteria for post-menopausal, Caucasian Women: Normal       T-score at or above -1 SD Osteopenia   T-score between -1 and -2.5 SD Osteoporosis T-score at or below -2.5 SD RECOMMENDATION: [place] recommends that FDA-approved medical therapies be considered in postmenopausal women and men age 67 or older with a: 1. Hip or vertebral (clinical or morphometric) fracture. 2. T-score of < -2.5 at the spine or hip. 3. Ten-year fracture probability by FRAX of 3% or greater for hip fracture or 20% or greater for major osteoporotic fracture. All treatment decisions require clinical judgment and consideration of individual patient factors, including patient preferences, co-morbidities, previous drug use, risk factors not captured in the FRAX model (e.g. falls, vitamin D  deficiency, increased bone turnover, interval significant decline in bone density) and possible under - or over-estimation of fracture risk by FRAX. All patients should ensure an adequate intake of dietary calcium (1200 mg/d) and vitamin D (800 IU daily) unless contraindicated. FOLLOW-UP: People with diagnosed cases of osteoporosis or at high risk for fracture should have regular bone mineral density tests. For patients eligible for Medicare, routine testing is allowed once every 2 years. The testing frequency can be increased to one year for patients who have rapidly progressing disease, those who are receiving or discontinuing medical therapy to restore bone mass, or have additional risk factors. I have reviewed this report and agree with the above findings. [facility] Patient: [name] Referring Physician: [name] Birth Date: 1951/01/31 [age] Patient ID: [id number] Height: 65.0 in. Weight: 325.0 lbs. Measured: 04/29/2016 10:02:15 AM (16 SP 2) Sex: Female Ethnicity: White Analyzed: 04/29/2016 10:12:23 AM (16 SP 2) FRAX* 10-year Probability of Fracture Based on femoral neck BMD: DualFemur (Left) Major Osteoporotic Fracture: 6.6% Hip Fracture:                0.5% Population:                  [place] (Caucasian) Risk Factors:                None *FRAX is a Materials engineer of the [employer] for Metabolic Bone Disease, a World Pharmacologist (WHO) [facility]. ASSESSMENT: The probability of a major osteoporotic fracture is 6.6% within the next ten years. The probability of a hip fracture is 0.5% within the next ten years. Electronically Signed   By: Lahoma Crocker M.D.   On: 04/29/2016 10:50   US Breast Ltd Uni Left Inc Axilla  Result Date: 05/08/2016 CLINICAL DATA:  Possible mass in the upper outer left breast on a recent screening mammogram. EXAM: 2D DIGITAL DIAGNOSTIC LEFT MAMMOGRAM WITH ADJUNCT TOMO ULTRASOUND LEFT BREAST COMPARISON:   Previous exam(s). ACR Breast Density Category b: There are scattered areas of fibroglandular density. FINDINGS: 2D and 3D tomographic spot compression views of the left breast were obtained. These confirm an 8 mm oval mass in the lateral aspect of the breast posteriorly, in approximately the 3 o'clock position. Some margins are circumscribed and others appears slightly irregular. On physical exam, no mass is palpable in the outer left breast or left axilla. Targeted ultrasound is performed, showing a 9 x 7 x 6 mm oval, irregular, hypoechoic mass with surrounding ill-defined increased echogenicity in the 3 o'clock position of the left breast, 14 cm from the nipple. This corresponds to the mammographic mass. Ultrasound of the left axilla  demonstrated normal appearing left axillary lymph nodes. IMPRESSION: 9 mm mass in the 3 o'clock position of the left breast with imaging features suspicious for malignancy. RECOMMENDATION: Ultrasound-guided core needle biopsy of the 9 mm mass in the 3 o'clock position of the left breast. This has been scheduled at 7:30 a.m. on 05/13/2016. I have discussed the findings and recommendations with the patient. Results were also provided in writing at the conclusion of the visit. If applicable, a reminder letter will be sent to the patient regarding the next appointment. BI-RADS CATEGORY  4: Suspicious. Electronically Signed   By: Steven  Reid M.D.   On: 05/08/2016 10:42   Mm Diag Breast Tomo Uni Left  Result Date: 05/08/2016 CLINICAL DATA:  Possible mass in the upper outer left breast on a recent screening mammogram. EXAM: 2D DIGITAL DIAGNOSTIC LEFT MAMMOGRAM WITH ADJUNCT TOMO ULTRASOUND LEFT BREAST COMPARISON:  Previous exam(s). ACR Breast Density Category b: There are scattered areas of fibroglandular density. FINDINGS: 2D and 3D tomographic spot compression views of the left breast were obtained. These confirm an 8 mm oval mass in the lateral aspect of the breast posteriorly, in  approximately the 3 o'clock position. Some margins are circumscribed and others appears slightly irregular. On physical exam, no mass is palpable in the outer left breast or left axilla. Targeted ultrasound is performed, showing a 9 x 7 x 6 mm oval, irregular, hypoechoic mass with surrounding ill-defined increased echogenicity in the 3 o'clock position of the left breast, 14 cm from the nipple. This corresponds to the mammographic mass. Ultrasound of the left axilla demonstrated normal appearing left axillary lymph nodes. IMPRESSION: 9 mm mass in the 3 o'clock position of the left breast with imaging features suspicious for malignancy. RECOMMENDATION: Ultrasound-guided core needle biopsy of the 9 mm mass in the 3 o'clock position of the left breast. This has been scheduled at 7:30 a.m. on 05/13/2016. I have discussed the findings and recommendations with the patient. Results were also provided in writing at the conclusion of the visit. If applicable, a reminder letter will be sent to the patient regarding the next appointment. BI-RADS CATEGORY  4: Suspicious. Electronically Signed   By: Steven  Reid M.D.   On: 05/08/2016 10:42   Mm Screening Breast Tomo Bilateral  Result Date: 04/30/2016 CLINICAL DATA:  Screening. EXAM: 2D DIGITAL SCREENING BILATERAL MAMMOGRAM WITH CAD AND ADJUNCT TOMO COMPARISON:  Previous exam(s). ACR Breast Density Category b: There are scattered areas of fibroglandular density. FINDINGS: In the left breast, a possible mass warrants further evaluation. In the right breast, no findings suspicious for malignancy. Images were processed with CAD. IMPRESSION: Further evaluation is suggested for possible mass in the left breast. RECOMMENDATION: Diagnostic mammogram and possibly ultrasound of the left breast. (Code:[id number]-L-00M) The patient will be contacted regarding the findings, and additional imaging will be scheduled. BI-RADS CATEGORY  0: Incomplete. Need additional imaging evaluation and/or prior  mammograms for comparison. Electronically Signed   By: Steven  Reid M.D.   On: 04/30/2016 17:01   Mm Clip Placement Left  Result Date: 05/13/2016 CLINICAL DATA:  Post biopsy mammogram of the left breast for clip placement. EXAM: DIAGNOSTIC LEFT MAMMOGRAM POST ULTRASOUND BIOPSY COMPARISON:  Previous exam(s). FINDINGS: Mammographic images were obtained following ultrasound guided biopsy of left breast mass at 3 o'clock. The ribbon shaped biopsy marking clip is visible in the lateral aspect of the left breast at the approximate 3 o'clock position. However, due to the hematoma, the small mass itself is obscured, and it   cannot be confirmed that the clip is within the mass. At the time of localization, a repeat mammogram should be performed, or alternatively, localization can be done with ultrasound guidance. IMPRESSION: Though the ribbon shaped biopsy marking clip is positioned in the left breast at 3 o'clock, due to the overlying hematoma the mass itself cannot be visualized. An additional mammogram may be necessary prior to localization, versus an ultrasound-guided localization to ensure that the mass is localized. Final Assessment: Post Procedure Mammograms for Marker Placement Electronically Signed   By: Ammie Ferrier M.D.   On: 05/13/2016 09:46   [name] Lt Breast Bx W Loc Dev 1st Lesion Img Bx Spec US Guide  Addendum Date: 05/15/2016   ADDENDUM REPORT: 05/14/2016 13:46 ADDENDUM: Pathology revealed GRADE III INVASIVE DUCTAL CARCINOMA of the Left breast at the 3:00 o'clock location. This was found to be concordant by Dr. Ammie Ferrier. Pathology results were discussed with the patient by telephone. The patient reported doing well after the biopsy with tenderness and bruising at the site. Post biopsy instructions and care were reviewed and questions were answered. The patient was encouraged to call [facility] for any additional concerns. The patient was referred to [facility] on May 22, 2016. Pathology results reported by Terie Purser, RN on 05/14/2016. Electronically Signed   By: Ammie Ferrier M.D.   On: 05/14/2016 13:46   Result Date: 05/15/2016 CLINICAL DATA:  65 year old female presenting for ultrasound-guided biopsy of a left breast mass. EXAM: ULTRASOUND GUIDED LEFT BREAST CORE NEEDLE BIOPSY COMPARISON:  Previous exam(s). FINDINGS: I met with the patient and we discussed the procedure of ultrasound-guided biopsy, including benefits and alternatives. We discussed the high likelihood of a successful procedure. We discussed the risks of the procedure, including infection, bleeding, tissue injury, clip migration, and inadequate sampling. Informed written consent was given. The usual time-out protocol was performed immediately prior to the procedure. Using sterile technique and 1% Lidocaine as local anesthetic, under direct ultrasound visualization, a 14 gauge spring-loaded device was used to perform biopsy of mass in the left breast at 3 o'clock using a lateral approach. At the conclusion of the procedure a ribbon shaped tissue marker clip was deployed into the biopsy cavity. Follow up 2 view mammogram was performed and dictated separately. IMPRESSION: Ultrasound guided biopsy of left breast mass at 3 o'clock. The patient did have significant bleeding at the time of biopsy which was controlled with manual compression. There was a broad palpable hematoma adjacent to the biopsy site. Otherwise, no apparent complications. Electronically Signed: By: Ammie Ferrier M.D. On: 05/13/2016 09:39    IMPRESSION: Clinical T1bNxMx, grade 3, invasive ductal carcinoma of the left breast (ER+, PR-, HER2+)  The patient is a candidate for breast conservation therapy with a left lumpectomy and sentinel lymph node biopsy and adjuvant radiation therapy. Even though the patient's tumor is HER2 positive, given the  patient's cardiac issues, she may not be a good candidate for chemotherapy.  I spoke to the patient today regarding her diagnosis and options for treatment. We discussed the equivalence in terms of survival and local failure between mastectomy and breast conservation. We discussed the role of radiation in decreasing local failures in patients who undergo lumpectomy. We discussed the process of simulation and the placement tattoos. We discussed 4-6 weeks of treatment as an outpatient. We discussed the possibility of asymptomatic lung damage. We discussed the low likelihood of secondary  malignancies. We discussed the possible side effects including but not limited to skin redness, fatigue, permanent skin darkening, and breast swelling.  We discussed the use of cardiac sparing with deep inspiration breath hold if needed.  PLAN: The patient will be scheduled for a left lumpectomy and sentinel lymph node biopsy. She will return to me after surgery to further discuss radiation. If she does have chemotherapy, then it would delay her radiation therapy.   ------------------------------------------------  Blair Promise, PhD, MD  This document serves as a record of services personally performed by Gery Pray, MD. It was created on his behalf by Darcus Austin, a trained medical scribe. The creation of this record is based on the scribe's personal observations and the provider's statements to them. This document has been checked and approved by the attending provider.

## 2016-05-23 NOTE — Telephone Encounter (Signed)
Holter has been scheduled 12/1.

## 2016-05-23 NOTE — Telephone Encounter (Signed)
Fasting labs and ECHO have been rescheduled to 12/6.

## 2016-05-24 ENCOUNTER — Other Ambulatory Visit: Payer: Commercial Managed Care - HMO | Admitting: *Deleted

## 2016-05-24 ENCOUNTER — Ambulatory Visit (INDEPENDENT_AMBULATORY_CARE_PROVIDER_SITE_OTHER): Payer: Commercial Managed Care - HMO

## 2016-05-24 DIAGNOSIS — E7800 Pure hypercholesterolemia, unspecified: Secondary | ICD-10-CM

## 2016-05-24 DIAGNOSIS — I493 Ventricular premature depolarization: Secondary | ICD-10-CM

## 2016-05-24 LAB — HEPATIC FUNCTION PANEL
ALBUMIN: 3.9 g/dL (ref 3.6–5.1)
ALT: 37 U/L — ABNORMAL HIGH (ref 6–29)
AST: 49 U/L — ABNORMAL HIGH (ref 10–35)
Alkaline Phosphatase: 90 U/L (ref 33–130)
BILIRUBIN DIRECT: 0.1 mg/dL (ref <=0.2)
BILIRUBIN TOTAL: 0.4 mg/dL (ref 0.2–1.2)
Indirect Bilirubin: 0.3 mg/dL (ref 0.2–1.2)
Total Protein: 6.9 g/dL (ref 6.1–8.1)

## 2016-05-24 LAB — LIPID PANEL
CHOL/HDL RATIO: 4.7 ratio (ref <5.0)
CHOLESTEROL: 169 mg/dL (ref <200)
HDL: 36 mg/dL — AB (ref >50)
LDL Cholesterol: 94 mg/dL (ref <100)
Triglycerides: 194 mg/dL — ABNORMAL HIGH (ref <150)
VLDL: 39 mg/dL — ABNORMAL HIGH (ref <30)

## 2016-05-27 DIAGNOSIS — G4733 Obstructive sleep apnea (adult) (pediatric): Secondary | ICD-10-CM | POA: Diagnosis not present

## 2016-05-27 DIAGNOSIS — Z96651 Presence of right artificial knee joint: Secondary | ICD-10-CM | POA: Diagnosis not present

## 2016-05-28 ENCOUNTER — Telehealth: Payer: Self-pay | Admitting: *Deleted

## 2016-05-28 ENCOUNTER — Telehealth: Payer: Self-pay

## 2016-05-28 DIAGNOSIS — E785 Hyperlipidemia, unspecified: Secondary | ICD-10-CM

## 2016-05-28 MED ORDER — ROSUVASTATIN CALCIUM 40 MG PO TABS: 40.0000 mg | ORAL_TABLET | Freq: Every day | ORAL | 3 refills | Status: DC

## 2016-05-28 NOTE — Telephone Encounter (Signed)
Informed patient of results and verbal understanding expressed.  Instructed patient to INCREASE CRESTOR to 40 mg daily. LFTs scheduled 06/28/16. Patient agrees with treatment plan.

## 2016-05-28 NOTE — Telephone Encounter (Signed)
Spoke to pt concerning [place] from 05/22/16. Denies questions or concerns regarding dx or treatment care plan. Encourage pt to call with needs. Received verbal understanding. Contact information provided.

## 2016-05-28 NOTE — Telephone Encounter (Signed)
-----   Message from Leeroy Bock, [facility] sent at 05/27/2016  5:03 PM EST ----- ALT chronically mildly elevated, does not seem to be statin related. Would recommend increasing Crestor to 40mg  daily and recheck lipids and LFTs in 1 month to ensure that LFTs remain stable.

## 2016-05-28 NOTE — Addendum Note (Signed)
Addended by: Harland German A on: 05/28/2016 06:18 PM   Modules accepted: Orders

## 2016-05-29 ENCOUNTER — Other Ambulatory Visit: Payer: Commercial Managed Care - HMO

## 2016-05-29 ENCOUNTER — Ambulatory Visit (HOSPITAL_COMMUNITY)
Admission: RE | Admit: 2016-05-29 | Discharge: 2016-05-29 | Disposition: A | Discharge status: Home / Self Care | Payer: Commercial Managed Care - HMO | Source: Ambulatory Visit | Attending: Cardiology | Admitting: Cardiology

## 2016-05-29 ENCOUNTER — Telehealth: Payer: Self-pay | Admitting: Cardiology

## 2016-05-29 DIAGNOSIS — Z853 Personal history of malignant neoplasm of breast: Secondary | ICD-10-CM | POA: Diagnosis not present

## 2016-05-29 DIAGNOSIS — E785 Hyperlipidemia, unspecified: Secondary | ICD-10-CM | POA: Insufficient documentation

## 2016-05-29 DIAGNOSIS — Z6841 Body Mass Index (BMI) 40.0 and over, adult: Secondary | ICD-10-CM | POA: Diagnosis not present

## 2016-05-29 DIAGNOSIS — I5032 Chronic diastolic (congestive) heart failure: Secondary | ICD-10-CM | POA: Diagnosis not present

## 2016-05-29 DIAGNOSIS — I493 Ventricular premature depolarization: Secondary | ICD-10-CM | POA: Insufficient documentation

## 2016-05-29 DIAGNOSIS — E669 Obesity, unspecified: Secondary | ICD-10-CM | POA: Diagnosis not present

## 2016-05-29 DIAGNOSIS — G4730 Sleep apnea, unspecified: Secondary | ICD-10-CM | POA: Insufficient documentation

## 2016-05-29 DIAGNOSIS — M549 Dorsalgia, unspecified: Secondary | ICD-10-CM | POA: Diagnosis not present

## 2016-05-29 DIAGNOSIS — I110 Hypertensive heart disease with heart failure: Secondary | ICD-10-CM | POA: Diagnosis not present

## 2016-05-29 NOTE — Telephone Encounter (Signed)
Called patient.  Advised of Holter Monitor result.  Asked if she had any palpatations during or since the test.  She responded that she had not.

## 2016-05-29 NOTE — Progress Notes (Signed)
  Echocardiogram 2D Echocardiogram has been performed.  [name] 05/29/2016, 10:19 AM

## 2016-05-29 NOTE — Telephone Encounter (Signed)
New message  Pt is returning call from today/maybe echo results  Please call back

## 2016-06-03 ENCOUNTER — Other Ambulatory Visit: Payer: Self-pay | Admitting: General Surgery

## 2016-06-03 DIAGNOSIS — C50412 Malignant neoplasm of upper-outer quadrant of left female breast: Secondary | ICD-10-CM

## 2016-06-03 DIAGNOSIS — Z170 Estrogen receptor positive status [ER+]: Principal | ICD-10-CM

## 2016-06-05 ENCOUNTER — Encounter: Payer: Self-pay | Admitting: Radiation Oncology

## 2016-06-12 ENCOUNTER — Other Ambulatory Visit (HOSPITAL_COMMUNITY): Payer: Commercial Managed Care - HMO

## 2016-06-12 NOTE — Pre-Procedure Instructions (Signed)
    [name]  06/12/2016      [facility] - [address], [place] [address] Phone: [phone] Fax: [phone]    Your procedure is scheduled on Friday, December 29th   Report to [facility] Admitting at 9:30 am.             (posted surgery time 11:20 am - 1:10 pm)   Call this number if you have problems the [facility] of surgery:  [phone], otherwise, you can call [phone] between 8 - 4:30.   Remember:  Do not eat food or drink liquids after midnight Thursday.  Take these medicines the morning of surgery with A SIP OF WATER : Lexapro, Nexium. Please use your inhalers that morning.   May use your Flonase if needed.               4-5 days prior to surgery, STOP TAKING any Vitamins, Herbal Supplements, Anti-inflammatories   Do not wear jewelry, make-up or nail polish.  Do not wear lotions, powders, perfumes, or deoderant.   Do not shave underarms & legs 48 hours prior to surgery.    Do not bring valuables to the hospital.  [facility] is not responsible for any belongings or valuables.  Contacts, dentures or bridgework may not be worn into surgery.  Leave your suitcase in the car.  After surgery it may be brought to your room.  For patients admitted to the hospital, discharge time will be determined by your treatment team.  Patients discharged the day of surgery will not be allowed to drive home.   Please read over the following fact sheets that you were given. Pain Booklet and Surgical Site Infection Prevention

## 2016-06-13 ENCOUNTER — Encounter (HOSPITAL_COMMUNITY)
Admission: RE | Admit: 2016-06-13 | Discharge: 2016-06-13 | Disposition: A | Discharge status: Home / Self Care | Payer: Commercial Managed Care - HMO | Source: Ambulatory Visit | Attending: General Surgery | Admitting: General Surgery

## 2016-06-13 ENCOUNTER — Encounter (HOSPITAL_COMMUNITY): Payer: Self-pay

## 2016-06-13 DIAGNOSIS — C50912 Malignant neoplasm of unspecified site of left female breast: Secondary | ICD-10-CM | POA: Diagnosis not present

## 2016-06-13 DIAGNOSIS — Z01812 Encounter for preprocedural laboratory examination: Secondary | ICD-10-CM | POA: Diagnosis not present

## 2016-06-13 HISTORY — DX: Nontoxic goiter, unspecified: E04.9

## 2016-06-13 LAB — CBC
HEMATOCRIT: 42.4 % (ref 36.0–46.0)
Hemoglobin: 13.4 g/dL (ref 12.0–15.0)
MCH: 28 pg (ref 26.0–34.0)
MCHC: 31.6 g/dL (ref 30.0–36.0)
MCV: 88.7 fL (ref 78.0–100.0)
PLATELETS: 279 10*3/uL (ref 150–400)
RBC: 4.78 MIL/uL (ref 3.87–5.11)
RDW: 14.5 % (ref 11.5–15.5)
WBC: 7.1 10*3/uL (ref 4.0–10.5)

## 2016-06-13 LAB — COMPREHENSIVE METABOLIC PANEL
ALBUMIN: 3.7 g/dL (ref 3.5–5.0)
ALT: 43 U/L (ref 14–54)
ANION GAP: 11 (ref 5–15)
AST: 51 U/L — AB (ref 15–41)
Alkaline Phosphatase: 89 U/L (ref 38–126)
BILIRUBIN TOTAL: 0.6 mg/dL (ref 0.3–1.2)
BUN: 14 mg/dL (ref 6–20)
CHLORIDE: 105 mmol/L (ref 101–111)
CO2: 24 mmol/L (ref 22–32)
Calcium: 9.2 mg/dL (ref 8.9–10.3)
Creatinine, Ser: 0.81 mg/dL (ref 0.44–1.00)
GFR calc Af Amer: >60 mL/min (ref >60)
GFR calc non Af Amer: >60 mL/min (ref >60)
GLUCOSE: 114 mg/dL — AB (ref 65–99)
POTASSIUM: 4.2 mmol/L (ref 3.5–5.1)
SODIUM: 140 mmol/L (ref 135–145)
Total Protein: 7.2 g/dL (ref 6.5–8.1)

## 2016-06-13 NOTE — Progress Notes (Signed)
PCP is Dr. Azalee Course  LOV 03/2016 Cardio is/was Dr. Ashok Norris  LOV 04/2016.  Was having some palptations and went to get check out.  Most recent echo was 05/2016 Boost drink given to patient with instructions to drink 2 hrs prior to arrival time (ERAS)

## 2016-06-16 NOTE — H&P (Signed)
[name] Location: [facility] Patient #: [id number] DOB: 03-07-1951 Divorced / Language: Cleophus Molt / Race: White Female        History of Present Illness       This is a 65 year old Caucasian female, referred by Ammie Ferrier at the breast center of [facility] for evaluation and management of a new breast cancer of the left breast at the 9:00 position. Dr. Vivien Rossetti is her PCP. Dr. Fransico Him is her cardiologist. She is being evaluated in the [facility] today by Dr. Lindi Adie, Dr. Sondra Come and me       She has no prior breast problems. Gets annual mammograms. Recent mammograms showed category B density, 9 mm mass in the left breast at the 3:00 position, 14 cm from the nipple. Axillary ultrasound negative. Image guided biopsy shows grade 3 invasive ductal carcinoma, ER 30%, PR negative, Ki-67 40%, HER-2 positive.       Comorbidities include prior cholecystectomy by me. Prior umbilical hernia repair by me. History LVH with CHF, cardiac catheterization. Obesity. Right total knee replacement. Obstructive sleep apnea. Asthma. Depression. Benign hypertension. C-section. Maternal grandmother and maternal aunt had colon cancer. No family history of breast or ovarian cancer. She is divorced. Lives in [place]. Has 3 children. Denies tobacco. Denies alcohol. She is retired but used to work on the [employer] and then after that worked in [employer] for a while.       We have discussed her options for intervention. We've talked about mastectomy with or without reconstruction, lumpectomy, sentinel lymph node biopsy. She clearly wants breast conservation I think she is a good candidate for that.       She is going to be referred to Dr. Fransico Him for cardiac risk assessment and clearance and echocardiogram. Dr. Lindi Adie will consider Herceptin chemotherapy if her cardiac status is good, otherwise he will probably hold off. Likely she will receive adjuvant whole  breast radiation therapy. She is not a good candidate for MammoSite because the tumor is so close to the skin.       Once we receive cardiac clearance, she will be scheduled for left breast lumpectomy with radioactive seed localization and left axillary sentinel lymph node biopsy I discussed the indications, details, techniques, and numerous risk of the surgery with her and her family members. She is aware the risk of bleeding, infection, cosmetic deformity, reoperation for positive margins or positive nodes, nerve damage with chronic pain, and other unforeseen problems. She understands all these issues well. All of her questions are answered. She agrees with this plan.  We will proceed with scheduling as soon as we get cardiac clearance.   Other Problems  Arthritis  Back Pain  Bladder Problems  Breast Cancer  Chest pain  Cholelithiasis  Diverticulosis  Gastroesophageal Reflux Disease  General anesthesia - complications  Heart murmur  Hemorrhoids  High blood pressure  Hypercholesterolemia  Lump In Breast  Sleep Apnea  Thyroid Disease  Umbilical Hernia Repair   Past Surgical History  Breast Biopsy  Left. Cesarean Section - Multiple  Gallbladder Surgery - Laparoscopic  Knee Surgery  Bilateral. Oral Surgery  Ventral / Umbilical Hernia Surgery  Bilateral.  Diagnostic Studies History  Colonoscopy  1-5 years ago Mammogram  within last year Pap Smear  1-5 years ago  Medication History  Medications Reconciled  Social History Alcohol use  Occasional alcohol use. No caffeine use  No drug use  Tobacco use  Never smoker.  Family History  Alcohol Abuse  Family Members In General. Arthritis  Father. Bleeding disorder  Mother. Colon Cancer  Family Members In General. Colon Polyps  Brother. Depression  Mother. Diabetes Mellitus  Family Members In General. Heart Disease  Brother, Father. Hypertension  Brother, Family Members In  [place], Father, Mother. Migraine Headache  Daughter. Respiratory Condition  Brother. Thyroid problems  Daughter, Family Members In General, Mother.  Pregnancy / Birth History  Age at menarche  21 years. Age of menopause  [age] Contraceptive History  Oral contraceptives. Gravida  3 Irregular periods  Maternal age  22-20 Para  3    Review of Systems  General Present- Night Sweats. Not Present- Appetite Loss, Chills, Fatigue, Fever, Weight Gain and Weight Loss. Skin Present- Dryness. Not Present- Change in Wart/Mole, Hives, Jaundice, New Lesions, Non-Healing Wounds, Rash and Ulcer. HEENT Present- Hearing Loss, Seasonal Allergies, Sinus Pain and Wears glasses/contact lenses. Not Present- Earache, Hoarseness, Nose Bleed, Oral Ulcers, Ringing in the Ears, Sore Throat, Visual Disturbances and Yellow Eyes. Respiratory Present- Snoring and Wheezing. Not Present- Bloody sputum, Chronic Cough and Difficulty Breathing. Breast Not Present- Breast Mass, Breast Pain, Nipple Discharge and Skin Changes. Cardiovascular Not Present- Chest Pain, Difficulty Breathing Lying Down, Leg Cramps, Palpitations, Rapid Heart Rate, Shortness of Breath and Swelling of Extremities. Gastrointestinal Present- Indigestion. Not Present- Abdominal Pain, Bloating, Bloody Stool, Change in Bowel Habits, Chronic diarrhea, Constipation, Difficulty Swallowing, Excessive gas, Gets full quickly at meals, Hemorrhoids, Nausea, Rectal Pain and Vomiting. Female Genitourinary Not Present- Frequency, Nocturia, Painful Urination, Pelvic Pain and Urgency. Musculoskeletal Present- Joint Pain and Joint Stiffness. Not Present- Back Pain, Muscle Pain, Muscle Weakness and Swelling of Extremities. Neurological Present- Headaches. Not Present- Decreased Memory, Fainting, Numbness, Seizures, Tingling, Tremor, Trouble walking and Weakness. Psychiatric Not Present- Anxiety, Bipolar, Change in Sleep Pattern, Depression, Fearful and Frequent  crying. Endocrine Present- Hot flashes. Not Present- Cold Intolerance, Excessive Hunger, Hair Changes, Heat Intolerance and New Diabetes. Hematology Present- Easy Bruising. Not Present- Blood Thinners, Excessive bleeding, Gland problems, HIV and Persistent Infections.   Physical Exam  General Mental Status-Alert. General Appearance-Consistent with stated age. Hydration-Well hydrated. Voice-Normal.  Head and Neck Head-normocephalic, atraumatic with no lesions or palpable masses. Trachea-midline. Thyroid Gland Characteristics - normal size and consistency.  Eye Eyeball - Bilateral-Extraocular movements intact. Sclera/Conjunctiva - Bilateral-No scleral icterus.  Chest and Lung Exam Chest and lung exam reveals -quiet, even and easy respiratory effort with no use of accessory muscles and on auscultation, normal breath sounds, no adventitious sounds and normal vocal resonance. Inspection Chest Wall - Normal. Back - normal.  Breast Note: Breasts are large. Small palpable hematoma left breast laterally and inferolaterally. No other mass in either breast. Nipple and areolar complex is normal. No axillary adenopathy.   Cardiovascular Cardiovascular examination reveals -normal heart sounds, regular rate and rhythm with no murmurs and normal pedal pulses bilaterally.  Abdomen Inspection Inspection of the abdomen reveals - No Hernias. Skin - Scar - Note: Laparoscopic scars. Transverse scar at umbilicus. Lower midline scar. No hernia. Palpation/Percussion Palpation and Percussion of the abdomen reveal - Soft, Non Tender, No Rebound tenderness, No Rigidity (guarding) and No hepatosplenomegaly. Auscultation Auscultation of the abdomen reveals - Bowel sounds normal.  Neurologic Neurologic evaluation reveals -alert and oriented x 3 with no impairment of recent or remote memory. Mental Status-Normal.  Musculoskeletal Normal Exam - Left-Upper Extremity Strength  Normal and Lower Extremity Strength Normal. Normal Exam - Right-Upper Extremity Strength Normal and Lower Extremity Strength Normal.  Lymphatic Head & Neck  General Head & Neck Lymphatics: Bilateral - Description - Normal. Axillary  General Axillary Region: Bilateral - Description - Normal. Tenderness - Non Tender. Femoral & Inguinal  Generalized Femoral & Inguinal Lymphatics: Bilateral - Description - Normal. Tenderness - Non Tender.    Assessment & Plan  PRIMARY CANCER OF UPPER OUTER QUADRANT OF LEFT FEMALE BREAST (C50.412).    Your recent imaging studies and left breast biopsy show a grade 3 invasive ductal carcinoma at about the 9:00 position of the left breast. This tumor has estrogen receptor 30%, HER-2 positive.  We have discussed the options for surgical intervention, including mastectomy with or without reconstruction, lumpectomy, and sentinel lymph node biopsy You have decided to proceed with left breast lumpectomy with radioactive seed localization and left axillary sentinel lymph node biopsy I agree that you are an excellent candidate for that We have discussed the indications, techniques, and numerous risk of that surgery. Please read the printed information that we have given you.  You'll be referred to Dr. Fransico Him, your cardiologist, for cardiac risk assessment and clearance for this surgery  Dr. Lindi Adie states that he will need an echocardiogram to decide whether or not you are a candidate for Herceptin chemotherapy. Those decisions will be made after surgery.    HYPERTENSIVE LEFT VENTRICULAR HYPERTROPHY, WITH HEART FAILURE (I11.0) HYPERTENSION, BENIGN (I10) SLEEP APNEA IN ADULT (G47.30) ASTHMA IN ADULT (J45.909) OSTEOARTHRITIS OF RIGHT KNEE, UNSPECIFIED OSTEOARTHRITIS TYPE (M17.11) Impression: History right total knee replacement GOITER, SIMPLE (E04.0) FAMILY HISTORY OF COLON CANCER (Z80.0) Impression: Maternal grandmother and maternal aunt had  colon cancer HISTORY OF LAPAROSCOPIC CHOLECYSTECTOMY (Z90.49) HISTORY OF UMBILICAL HERNIA REPAIR (Z98.890) BMI 38.0-38.9,ADULT (Z68.38)    Philopater Mucha M. Dalbert Batman, M.D., [facility] General and Minimally invasive Surgery Breast and Colorectal Surgery Office:   [phone] Pager:   [phone]

## 2016-06-18 ENCOUNTER — Ambulatory Visit
Admission: RE | Admit: 2016-06-18 | Discharge: 2016-06-18 | Disposition: A | Discharge status: Home / Self Care | Payer: Commercial Managed Care - HMO | Source: Ambulatory Visit | Attending: General Surgery | Admitting: General Surgery

## 2016-06-18 DIAGNOSIS — C50912 Malignant neoplasm of unspecified site of left female breast: Secondary | ICD-10-CM | POA: Diagnosis not present

## 2016-06-18 DIAGNOSIS — C50412 Malignant neoplasm of upper-outer quadrant of left female breast: Secondary | ICD-10-CM

## 2016-06-18 DIAGNOSIS — Z170 Estrogen receptor positive status [ER+]: Principal | ICD-10-CM

## 2016-06-20 MED ORDER — DEXTROSE 5 % IV SOLN
3.0000 g | INTRAVENOUS | Status: AC
  Administered 2016-06-21: 3 g via INTRAVENOUS
  Filled 2016-06-20: qty 3000

## 2016-06-21 ENCOUNTER — Encounter (HOSPITAL_COMMUNITY): Payer: Self-pay | Admitting: *Deleted

## 2016-06-21 ENCOUNTER — Encounter (HOSPITAL_COMMUNITY)
Admission: RE | Admit: 2016-06-21 | Discharge: 2016-06-21 | Disposition: A | Discharge status: Home / Self Care | Payer: Commercial Managed Care - HMO | Source: Ambulatory Visit | Attending: General Surgery | Admitting: General Surgery

## 2016-06-21 ENCOUNTER — Ambulatory Visit (HOSPITAL_COMMUNITY)
Admission: RE | Admit: 2016-06-21 | Discharge: 2016-06-21 | Disposition: A | Discharge status: Home / Self Care | Payer: Commercial Managed Care - HMO | Source: Ambulatory Visit | Attending: General Surgery | Admitting: General Surgery

## 2016-06-21 ENCOUNTER — Ambulatory Visit (HOSPITAL_COMMUNITY): Payer: Commercial Managed Care - HMO | Admitting: Certified Registered Nurse Anesthetist

## 2016-06-21 ENCOUNTER — Ambulatory Visit
Admission: RE | Admit: 2016-06-21 | Discharge: 2016-06-21 | Disposition: A | Discharge status: Home / Self Care | Payer: Commercial Managed Care - HMO | Source: Ambulatory Visit | Attending: General Surgery | Admitting: General Surgery

## 2016-06-21 ENCOUNTER — Encounter (HOSPITAL_COMMUNITY)
Admission: RE | Disposition: A | Discharge status: Home / Self Care | Payer: Self-pay | Source: Ambulatory Visit | Attending: General Surgery

## 2016-06-21 DIAGNOSIS — G8918 Other acute postprocedural pain: Secondary | ICD-10-CM | POA: Diagnosis not present

## 2016-06-21 DIAGNOSIS — Z7982 Long term (current) use of aspirin: Secondary | ICD-10-CM | POA: Diagnosis not present

## 2016-06-21 DIAGNOSIS — E049 Nontoxic goiter, unspecified: Secondary | ICD-10-CM | POA: Diagnosis not present

## 2016-06-21 DIAGNOSIS — I509 Heart failure, unspecified: Secondary | ICD-10-CM | POA: Diagnosis not present

## 2016-06-21 DIAGNOSIS — Z8719 Personal history of other diseases of the digestive system: Secondary | ICD-10-CM | POA: Insufficient documentation

## 2016-06-21 DIAGNOSIS — J45909 Unspecified asthma, uncomplicated: Secondary | ICD-10-CM | POA: Insufficient documentation

## 2016-06-21 DIAGNOSIS — G4733 Obstructive sleep apnea (adult) (pediatric): Secondary | ICD-10-CM | POA: Diagnosis not present

## 2016-06-21 DIAGNOSIS — Z9049 Acquired absence of other specified parts of digestive tract: Secondary | ICD-10-CM | POA: Diagnosis not present

## 2016-06-21 DIAGNOSIS — I5032 Chronic diastolic (congestive) heart failure: Secondary | ICD-10-CM | POA: Diagnosis not present

## 2016-06-21 DIAGNOSIS — F329 Major depressive disorder, single episode, unspecified: Secondary | ICD-10-CM | POA: Diagnosis not present

## 2016-06-21 DIAGNOSIS — F419 Anxiety disorder, unspecified: Secondary | ICD-10-CM | POA: Insufficient documentation

## 2016-06-21 DIAGNOSIS — Z170 Estrogen receptor positive status [ER+]: Principal | ICD-10-CM

## 2016-06-21 DIAGNOSIS — E7800 Pure hypercholesterolemia, unspecified: Secondary | ICD-10-CM | POA: Insufficient documentation

## 2016-06-21 DIAGNOSIS — Z96651 Presence of right artificial knee joint: Secondary | ICD-10-CM | POA: Insufficient documentation

## 2016-06-21 DIAGNOSIS — Z800 Family history of malignant neoplasm of digestive organs: Secondary | ICD-10-CM | POA: Insufficient documentation

## 2016-06-21 DIAGNOSIS — C50412 Malignant neoplasm of upper-outer quadrant of left female breast: Secondary | ICD-10-CM | POA: Diagnosis present

## 2016-06-21 DIAGNOSIS — I110 Hypertensive heart disease with heart failure: Secondary | ICD-10-CM | POA: Insufficient documentation

## 2016-06-21 DIAGNOSIS — M1990 Unspecified osteoarthritis, unspecified site: Secondary | ICD-10-CM | POA: Insufficient documentation

## 2016-06-21 DIAGNOSIS — Z6841 Body Mass Index (BMI) 40.0 and over, adult: Secondary | ICD-10-CM | POA: Insufficient documentation

## 2016-06-21 DIAGNOSIS — C773 Secondary and unspecified malignant neoplasm of axilla and upper limb lymph nodes: Secondary | ICD-10-CM | POA: Insufficient documentation

## 2016-06-21 DIAGNOSIS — C50912 Malignant neoplasm of unspecified site of left female breast: Secondary | ICD-10-CM | POA: Diagnosis not present

## 2016-06-21 DIAGNOSIS — K219 Gastro-esophageal reflux disease without esophagitis: Secondary | ICD-10-CM | POA: Diagnosis not present

## 2016-06-21 HISTORY — PX: BREAST LUMPECTOMY WITH RADIOACTIVE SEED AND SENTINEL LYMPH NODE BIOPSY: SHX6550

## 2016-06-21 HISTORY — DX: Personal history of urinary calculi: Z87.442

## 2016-06-21 SURGERY — BREAST LUMPECTOMY WITH RADIOACTIVE SEED AND SENTINEL LYMPH NODE BIOPSY
Anesthesia: General | Site: Breast | Laterality: Left

## 2016-06-21 MED ORDER — ROCURONIUM BROMIDE 100 MG/10ML IV SOLN
INTRAVENOUS | Status: DC | PRN
  Administered 2016-06-21: 50 mg via INTRAVENOUS

## 2016-06-21 MED ORDER — MIDAZOLAM HCL 2 MG/2ML IJ SOLN
INTRAMUSCULAR | Status: AC
  Filled 2016-06-21: qty 2

## 2016-06-21 MED ORDER — BUPIVACAINE HCL (PF) 0.25 % IJ SOLN
INTRAMUSCULAR | Status: AC
  Filled 2016-06-21: qty 30

## 2016-06-21 MED ORDER — LIDOCAINE HCL (CARDIAC) 20 MG/ML IV SOLN
INTRAVENOUS | Status: DC | PRN
  Administered 2016-06-21: 60 mg via INTRAVENOUS

## 2016-06-21 MED ORDER — SUGAMMADEX SODIUM 500 MG/5ML IV SOLN
INTRAVENOUS | Status: DC | PRN
  Administered 2016-06-21: 297.8 mg via INTRAVENOUS

## 2016-06-21 MED ORDER — OXYCODONE HCL 5 MG PO TABS: 5.0000 mg | ORAL_TABLET | ORAL | Status: DC | PRN

## 2016-06-21 MED ORDER — CHLORHEXIDINE GLUCONATE CLOTH 2 % EX PADS: 6.0000 | MEDICATED_PAD | Freq: Once | CUTANEOUS | Status: DC

## 2016-06-21 MED ORDER — DEXAMETHASONE SODIUM PHOSPHATE 10 MG/ML IJ SOLN
INTRAMUSCULAR | Status: AC
  Filled 2016-06-21: qty 2

## 2016-06-21 MED ORDER — FENTANYL CITRATE (PF) 100 MCG/2ML IJ SOLN: 25.0000 ug | INTRAMUSCULAR | Status: DC | PRN

## 2016-06-21 MED ORDER — BUPIVACAINE-EPINEPHRINE (PF) 0.5% -1:200000 IJ SOLN
INTRAMUSCULAR | Status: DC | PRN
  Administered 2016-06-21: 25 mL via PERINEURAL

## 2016-06-21 MED ORDER — SCOPOLAMINE 1 MG/3DAYS TD PT72: 1.0000 | MEDICATED_PATCH | TRANSDERMAL | Status: DC

## 2016-06-21 MED ORDER — ONDANSETRON HCL 4 MG/2ML IJ SOLN
INTRAMUSCULAR | Status: DC | PRN
Start: 2016-06-21 — End: 2016-06-21
  Administered 2016-06-21: 4 mg via INTRAVENOUS

## 2016-06-21 MED ORDER — SODIUM CHLORIDE 0.9 % IJ SOLN
INTRAVENOUS | Status: DC | PRN
  Administered 2016-06-21: 5 mL via INTRAMUSCULAR

## 2016-06-21 MED ORDER — FENTANYL CITRATE (PF) 100 MCG/2ML IJ SOLN
INTRAMUSCULAR | Status: AC
  Filled 2016-06-21: qty 2

## 2016-06-21 MED ORDER — LACTATED RINGERS IV SOLN
INTRAVENOUS | Status: DC
  Administered 2016-06-21: 10:00:00 via INTRAVENOUS

## 2016-06-21 MED ORDER — SCOPOLAMINE 1 MG/3DAYS TD PT72
MEDICATED_PATCH | TRANSDERMAL | Status: DC | PRN
  Administered 2016-06-21: 1 via TRANSDERMAL

## 2016-06-21 MED ORDER — PROPOFOL 10 MG/ML IV BOLUS
INTRAVENOUS | Status: AC
  Filled 2016-06-21: qty 20

## 2016-06-21 MED ORDER — SODIUM CHLORIDE 0.9 % IJ SOLN
INTRAMUSCULAR | Status: AC
  Filled 2016-06-21: qty 10

## 2016-06-21 MED ORDER — SODIUM CHLORIDE 0.9% FLUSH: 3.0000 mL | Freq: Two times a day (BID) | INTRAVENOUS | Status: DC

## 2016-06-21 MED ORDER — MIDAZOLAM HCL 2 MG/2ML IJ SOLN
1.0000 mg | Freq: Once | INTRAMUSCULAR | Status: AC
  Administered 2016-06-21: 1 mg via INTRAVENOUS

## 2016-06-21 MED ORDER — 0.9 % SODIUM CHLORIDE (POUR BTL) OPTIME
TOPICAL | Status: DC | PRN
  Administered 2016-06-21: 1000 mL

## 2016-06-21 MED ORDER — LIDOCAINE 2% (20 MG/ML) 5 ML SYRINGE
INTRAMUSCULAR | Status: AC
  Filled 2016-06-21: qty 10

## 2016-06-21 MED ORDER — EPHEDRINE SULFATE 50 MG/ML IJ SOLN
INTRAMUSCULAR | Status: DC | PRN
  Administered 2016-06-21 (×3): 5 mg via INTRAVENOUS

## 2016-06-21 MED ORDER — ONDANSETRON HCL 4 MG/2ML IJ SOLN: 4.0000 mg | Freq: Four times a day (QID) | INTRAMUSCULAR | Status: DC | PRN

## 2016-06-21 MED ORDER — FENTANYL CITRATE (PF) 100 MCG/2ML IJ SOLN
INTRAMUSCULAR | Status: AC
  Administered 2016-06-21: 50 ug via INTRAVENOUS
  Filled 2016-06-21: qty 2

## 2016-06-21 MED ORDER — SUGAMMADEX SODIUM 500 MG/5ML IV SOLN
INTRAVENOUS | Status: AC
  Filled 2016-06-21: qty 5

## 2016-06-21 MED ORDER — PHENYLEPHRINE HCL 10 MG/ML IJ SOLN
INTRAMUSCULAR | Status: DC | PRN
  Administered 2016-06-21: 25 ug/min via INTRAVENOUS

## 2016-06-21 MED ORDER — OXYCODONE HCL 5 MG/5ML PO SOLN: 5.0000 mg | Freq: Once | ORAL | Status: DC | PRN

## 2016-06-21 MED ORDER — PHENYLEPHRINE 40 MCG/ML (10ML) SYRINGE FOR IV PUSH (FOR BLOOD PRESSURE SUPPORT)
PREFILLED_SYRINGE | INTRAVENOUS | Status: AC
  Filled 2016-06-21: qty 10

## 2016-06-21 MED ORDER — EPHEDRINE 5 MG/ML INJ
INTRAVENOUS | Status: AC
  Filled 2016-06-21: qty 10

## 2016-06-21 MED ORDER — CELECOXIB 200 MG PO CAPS
400.0000 mg | ORAL_CAPSULE | ORAL | Status: AC
  Administered 2016-06-21: 400 mg via ORAL
  Filled 2016-06-21: qty 2

## 2016-06-21 MED ORDER — OXYCODONE HCL 5 MG PO TABS: 5.0000 mg | ORAL_TABLET | Freq: Once | ORAL | Status: DC | PRN

## 2016-06-21 MED ORDER — ROCURONIUM BROMIDE 50 MG/5ML IV SOSY
PREFILLED_SYRINGE | INTRAVENOUS | Status: AC
  Filled 2016-06-21: qty 10

## 2016-06-21 MED ORDER — SODIUM CHLORIDE 0.9% FLUSH: 3.0000 mL | INTRAVENOUS | Status: DC | PRN

## 2016-06-21 MED ORDER — ACETAMINOPHEN 650 MG RE SUPP: 650.0000 mg | RECTAL | Status: DC | PRN

## 2016-06-21 MED ORDER — ONDANSETRON HCL 4 MG/2ML IJ SOLN
INTRAMUSCULAR | Status: AC
  Filled 2016-06-21: qty 2

## 2016-06-21 MED ORDER — METHYLENE BLUE 0.5 % INJ SOLN
INTRAVENOUS | Status: AC
  Filled 2016-06-21: qty 10

## 2016-06-21 MED ORDER — HYDROMORPHONE HCL 1 MG/ML IJ SOLN: 0.2500 mg | INTRAMUSCULAR | Status: DC | PRN

## 2016-06-21 MED ORDER — SUCCINYLCHOLINE CHLORIDE 20 MG/ML IJ SOLN
INTRAMUSCULAR | Status: DC | PRN
  Administered 2016-06-21: 120 mg via INTRAVENOUS

## 2016-06-21 MED ORDER — ACETAMINOPHEN 500 MG PO TABS
1000.0000 mg | ORAL_TABLET | ORAL | Status: AC
  Administered 2016-06-21: 1000 mg via ORAL
  Filled 2016-06-21: qty 2

## 2016-06-21 MED ORDER — MIDAZOLAM HCL 2 MG/2ML IJ SOLN
INTRAMUSCULAR | Status: AC
  Administered 2016-06-21: 1 mg via INTRAVENOUS
  Filled 2016-06-21: qty 2

## 2016-06-21 MED ORDER — LACTATED RINGERS IV SOLN
INTRAVENOUS | Status: DC | PRN
  Administered 2016-06-21: 11:00:00 via INTRAVENOUS

## 2016-06-21 MED ORDER — SUCCINYLCHOLINE CHLORIDE 200 MG/10ML IV SOSY
PREFILLED_SYRINGE | INTRAVENOUS | Status: AC
  Filled 2016-06-21: qty 10

## 2016-06-21 MED ORDER — HYDROCODONE-ACETAMINOPHEN 5-325 MG PO TABS: 1.0000 | ORAL_TABLET | Freq: Four times a day (QID) | ORAL | 0 refills | Status: DC | PRN

## 2016-06-21 MED ORDER — FENTANYL CITRATE (PF) 100 MCG/2ML IJ SOLN
INTRAMUSCULAR | Status: DC | PRN
  Administered 2016-06-21: 100 ug via INTRAVENOUS

## 2016-06-21 MED ORDER — SODIUM CHLORIDE 0.9 % IV SOLN: 250.0000 mL | INTRAVENOUS | Status: DC | PRN

## 2016-06-21 MED ORDER — ACETAMINOPHEN 325 MG PO TABS: 650.0000 mg | ORAL_TABLET | ORAL | Status: DC | PRN

## 2016-06-21 MED ORDER — FENTANYL CITRATE (PF) 100 MCG/2ML IJ SOLN
50.0000 ug | Freq: Once | INTRAMUSCULAR | Status: AC
  Administered 2016-06-21: 50 ug via INTRAVENOUS

## 2016-06-21 MED ORDER — DEXAMETHASONE SODIUM PHOSPHATE 10 MG/ML IJ SOLN
INTRAMUSCULAR | Status: DC | PRN
  Administered 2016-06-21: 10 mg via INTRAVENOUS

## 2016-06-21 MED ORDER — PROPOFOL 10 MG/ML IV BOLUS
INTRAVENOUS | Status: DC | PRN
  Administered 2016-06-21: 200 mg via INTRAVENOUS

## 2016-06-21 MED ORDER — BUPIVACAINE HCL (PF) 0.25 % IJ SOLN
INTRAMUSCULAR | Status: DC | PRN
  Administered 2016-06-21: 15 mL

## 2016-06-21 MED ORDER — TECHNETIUM TC 99M SULFUR COLLOID FILTERED
1.0000 | Freq: Once | INTRAVENOUS | Status: AC | PRN
  Administered 2016-06-21: 1 via INTRADERMAL

## 2016-06-21 MED ORDER — LACTATED RINGERS IV SOLN: INTRAVENOUS | Status: DC

## 2016-06-21 MED ORDER — GABAPENTIN 300 MG PO CAPS
300.0000 mg | ORAL_CAPSULE | ORAL | Status: AC
  Administered 2016-06-21: 300 mg via ORAL
  Filled 2016-06-21: qty 1

## 2016-06-21 SURGICAL SUPPLY — 52 items
ADH SKN CLS APL DERMABOND .7 (GAUZE/BANDAGES/DRESSINGS) ×2
APPLIER CLIP 9.375 MED OPEN (MISCELLANEOUS) ×2
BINDER BREAST 3XL (BIND) ×2 IMPLANT
BLADE SURG 15 STRL LF DISP TIS (BLADE) ×1 IMPLANT
BLADE SURG 15 STRL SS (BLADE) ×1
CANISTER SUCTION 2500CC (MISCELLANEOUS) ×2 IMPLANT
CHLORAPREP W/TINT 26ML (MISCELLANEOUS) ×2 IMPLANT
CLIP APPLIE 9.375 MED OPEN (MISCELLANEOUS) ×1 IMPLANT
CONT SPEC 4OZ CLIKSEAL STRL BL (MISCELLANEOUS) ×6 IMPLANT
COVER PROBE W GEL 5X96 (DRAPES) ×2 IMPLANT
COVER SURGICAL LIGHT HANDLE (MISCELLANEOUS) ×2 IMPLANT
DERMABOND ADVANCED (GAUZE/BANDAGES/DRESSINGS) ×2
DERMABOND ADVANCED .7 DNX12 (GAUZE/BANDAGES/DRESSINGS) ×2 IMPLANT
DEVICE DUBIN SPECIMEN MAMMOGRA (MISCELLANEOUS) ×2 IMPLANT
DRAPE CHEST BREAST 15X10 FENES (DRAPES) ×2 IMPLANT
DRAPE PROXIMA HALF (DRAPES) ×2 IMPLANT
DRAPE UTILITY XL STRL (DRAPES) ×2 IMPLANT
DRSG PAD ABDOMINAL 8X10 ST (GAUZE/BANDAGES/DRESSINGS) ×4 IMPLANT
ELECT CAUTERY BLADE 6.4 (BLADE) ×2 IMPLANT
ELECT REM PT RETURN 9FT ADLT (ELECTROSURGICAL) ×2
ELECTRODE REM PT RTRN 9FT ADLT (ELECTROSURGICAL) ×1 IMPLANT
GAUZE SPONGE 4X4 12PLY STRL (GAUZE/BANDAGES/DRESSINGS) ×2 IMPLANT
GLOVE BIO SURGEON STRL SZ8 (GLOVE) ×2 IMPLANT
GLOVE BIOGEL PI IND STRL 7.0 (GLOVE) ×1 IMPLANT
GLOVE BIOGEL PI IND STRL 8.5 (GLOVE) ×1 IMPLANT
GLOVE BIOGEL PI INDICATOR 7.0 (GLOVE) ×1
GLOVE BIOGEL PI INDICATOR 8.5 (GLOVE) ×1
GLOVE EUDERMIC 7 POWDERFREE (GLOVE) ×2 IMPLANT
GOWN STRL REUS W/ TWL XL LVL3 (GOWN DISPOSABLE) ×2 IMPLANT
GOWN STRL REUS W/TWL XL LVL3 (GOWN DISPOSABLE) ×4
KIT BASIN OR (CUSTOM PROCEDURE TRAY) ×2 IMPLANT
KIT MARKER MARGIN INK (KITS) ×2 IMPLANT
NDL SAFETY ECLIPSE 18X1.5 (NEEDLE) ×1 IMPLANT
NEEDLE FILTER BLUNT 18X 1/2SAF (NEEDLE) ×1
NEEDLE FILTER BLUNT 18X1 1/2 (NEEDLE) ×1 IMPLANT
NEEDLE HYPO 18GX1.5 SHARP (NEEDLE) ×1
NEEDLE HYPO 25X1 1.5 SAFETY (NEEDLE) ×4 IMPLANT
NS IRRIG 1000ML POUR BTL (IV SOLUTION) ×2 IMPLANT
PACK SURGICAL SETUP 50X90 (CUSTOM PROCEDURE TRAY) ×2 IMPLANT
PENCIL BUTTON HOLSTER BLD 10FT (ELECTRODE) ×2 IMPLANT
SPONGE LAP 18X18 X RAY DECT (DISPOSABLE) ×2 IMPLANT
SPONGE LAP 4X18 X RAY DECT (DISPOSABLE) ×2 IMPLANT
SUT MNCRL AB 4-0 PS2 18 (SUTURE) ×4 IMPLANT
SUT SILK 2 0 SH (SUTURE) ×2 IMPLANT
SUT VIC AB 2-0 SH 27 (SUTURE) ×2
SUT VIC AB 2-0 SH 27X BRD (SUTURE) ×2 IMPLANT
SUT VIC AB 3-0 SH 18 (SUTURE) ×4 IMPLANT
SYR BULB 3OZ (MISCELLANEOUS) ×2 IMPLANT
SYR CONTROL 10ML LL (SYRINGE) ×4 IMPLANT
TOWEL OR 17X26 10 PK STRL BLUE (TOWEL DISPOSABLE) ×2 IMPLANT
TUBE CONNECTING 12X1/4 (SUCTIONS) ×2 IMPLANT
YANKAUER SUCT BULB TIP NO VENT (SUCTIONS) ×2 IMPLANT

## 2016-06-21 NOTE — Anesthesia Preprocedure Evaluation (Signed)
Anesthesia Evaluation  Patient identified by MRN, date of birth, ID band Patient awake    Reviewed: Allergy & Precautions, H&P , NPO status , Patient's Chart, lab work & pertinent test results  History of Anesthesia Complications (+) PONV and history of anesthetic complications  Airway Mallampati: II   Neck ROM: full    Dental   Pulmonary sleep apnea ,    breath sounds clear to auscultation       Cardiovascular hypertension, +CHF   Rhythm:regular Rate:Normal     Neuro/Psych PSYCHIATRIC DISORDERS Anxiety    GI/Hepatic GERD  ,  Endo/Other  Morbid obesity  Renal/GU      Musculoskeletal  (+) Arthritis ,   Abdominal   Peds  Hematology   Anesthesia Other Findings   Reproductive/Obstetrics                             Anesthesia Physical Anesthesia Plan  ASA: III  Anesthesia Plan: General   Post-op Pain Management:  Regional for Post-op pain   Induction: Intravenous  Airway Management Planned: Oral ETT  Additional Equipment:   Intra-op Plan:   Post-operative Plan: Extubation in OR  Informed Consent: I have reviewed the patients History and Physical, chart, labs and discussed the procedure including the risks, benefits and alternatives for the proposed anesthesia with the patient or authorized representative who has indicated his/her understanding and acceptance.     Plan Discussed with: CRNA, Anesthesiologist and Surgeon  Anesthesia Plan Comments:         Anesthesia Quick Evaluation

## 2016-06-21 NOTE — Discharge Instructions (Signed)
[facility] °Office Phone Number [phone] ° °BREAST BIOPSY/ PARTIAL MASTECTOMY: POST OP INSTRUCTIONS ° °Always review your discharge instruction sheet given to you by the facility where your surgery was performed. ° °IF YOU HAVE DISABILITY OR FAMILY LEAVE FORMS, YOU MUST BRING THEM TO THE OFFICE FOR PROCESSING.  DO NOT GIVE THEM TO YOUR DOCTOR. ° °1. A prescription for pain medication may be given to you upon discharge.  Take your pain medication as prescribed, if needed.  If narcotic pain medicine is not needed, then you may take acetaminophen (Tylenol) or ibuprofen (Advil) as needed. °2. Take your usually prescribed medications unless otherwise directed °3. If you need a refill on your pain medication, please contact your pharmacy.  They will contact our office to request authorization.  Prescriptions will not be filled after 5pm or on week-ends. °4. You should eat very light the first 24 hours after surgery, such as soup, crackers, pudding, etc.  Resume your normal diet the day after surgery. °5. Most patients will experience some swelling and bruising in the breast.  Ice packs and a good support bra will help.  Swelling and bruising can take several days to resolve.  °6. It is common to experience some constipation if taking pain medication after surgery.  Increasing fluid intake and taking a stool softener will usually help or prevent this problem from occurring.  A mild laxative (Milk of Magnesia or Miralax) should be taken according to package directions if there are no bowel movements after 48 hours. °7. Unless discharge instructions indicate otherwise, you may remove your bandages 24-48 hours after surgery, and you may shower at that time.  You may have steri-strips (small skin tapes) in place directly over the incision.  These strips should be left on the skin for 7-10 days.  If your surgeon used skin glue on the incision, you may shower in 24 hours.  The glue will flake off over the  next 2-3 weeks.  Any sutures or staples will be removed at the office during your follow-up visit. °8. ACTIVITIES:  You may resume regular daily activities (gradually increasing) beginning the next day.  Wearing a good support bra or sports bra minimizes pain and swelling.  You may have sexual intercourse when it is comfortable. °a. You may drive when you no longer are taking prescription pain medication, you can comfortably wear a seatbelt, and you can safely maneuver your car and apply brakes. °b. RETURN TO WORK:  ______________________________________________________________________________________ °9. You should see your doctor in the office for a follow-up appointment approximately two weeks after your surgery.  Your doctor’s nurse will typically make your follow-up appointment when she calls you with your pathology report.  Expect your pathology report 2-3 business days after your surgery.  You may call to check if you do not hear from us after three days. °10. OTHER INSTRUCTIONS: _______________________________________________________________________________________________ _____________________________________________________________________________________________________________________________________ °_____________________________________________________________________________________________________________________________________ °_____________________________________________________________________________________________________________________________________ ° °WHEN TO CALL YOUR DOCTOR: °1. Fever over 101.0 °2. Nausea and/or vomiting. °3. Extreme swelling or bruising. °4. Continued bleeding from incision. °5. Increased pain, redness, or drainage from the incision. ° °The clinic staff is available to answer your questions during regular business hours.  Please don’t hesitate to call and ask to speak to one of the nurses for clinical concerns.  If you have a medical emergency, go to the nearest  emergency room or call 911.  A surgeon from [facility] is always on call at the hospital. ° °For further questions, please visit [email or web address]  °

## 2016-06-21 NOTE — Transfer of Care (Signed)
Immediate Anesthesia Transfer of Care Note  Patient: [name]  Procedure(s) Performed: Procedure(s): LEFT BREAST LUMPECTOMY WITH RADIOACTIVE SEED AND SENTINEL LYMPH NODE BIOPSY, INJECT BLUE DYE LEFT BREAST (Left)  Patient Location: PACU  Anesthesia Type:General  Level of Consciousness: awake, patient cooperative and responds to stimulation  Airway & Oxygen Therapy: Patient Spontanous Breathing and Patient connected to face mask oxygen  Post-op Assessment: Report given to RN, Post -op Vital signs reviewed and stable and Patient moving all extremities X 4  Post vital signs: Reviewed and stable  Last Vitals:  Vitals:   06/21/16 1106 06/21/16 1305  BP: 130/74   Pulse: 70   Resp: (!) 23   Temp:  37 C    Last Pain:  Vitals:   06/21/16 1105  TempSrc:   PainSc: 0-No pain         Complications: No apparent anesthesia complications

## 2016-06-21 NOTE — Anesthesia Procedure Notes (Signed)
Procedure Name: Intubation Date/Time: 06/21/2016 11:36 AM Performed by: Tressia Miners LEFFEW Pre-anesthesia Checklist: Patient identified, Patient being monitored, Timeout performed, Emergency Drugs available and Suction available Patient Re-evaluated:Patient Re-evaluated prior to inductionOxygen Delivery Method: Circle System Utilized Preoxygenation: Pre-oxygenation with 100% oxygen Intubation Type: IV induction Laryngoscope Size: 3 and Glidescope Grade View: Grade I Tube type: Oral Tube size: 7.5 mm Number of attempts: 1 Airway Equipment and Method: Rigid stylet Placement Confirmation: ETT inserted through vocal cords under direct vision,  positive ETCO2 and breath sounds checked- equal and bilateral Secured at: 22 cm Tube secured with: Tape Dental Injury: Teeth and Oropharynx as per pre-operative assessment

## 2016-06-21 NOTE — Anesthesia Procedure Notes (Signed)
Anesthesia Regional Block:  Pectoralis block  Pre-Anesthetic Checklist: ,, timeout performed, Correct Patient, Correct Site, Correct Laterality, Correct Procedure, Correct Position, site marked, Risks and benefits discussed,  Surgical consent,  Pre-op evaluation,  At surgeon's request and post-op pain management  Laterality: Left  Prep: chloraprep       Needles:  Injection technique: Single-shot  Needle Type: Echogenic Needle     Needle Length: 9cm 9 cm Needle Gauge: 21 and 21 G    Additional Needles:  Procedures: ultrasound guided (picture in chart) Pectoralis block Narrative:  Start time: 06/21/2016 10:52 AM End time: 06/21/2016 10:59 AM Injection made incrementally with aspirations every 5 mL.  Performed by: Personally  Anesthesiologist: Georgeanna Radziewicz  Additional Notes: Pt tolerated the procedure well.

## 2016-06-21 NOTE — Interval H&P Note (Signed)
History and Physical Interval Note:  06/21/2016 9:45 AM  [name]  has presented today for surgery, with the diagnosis of LEFT BREAST CANCER  The various methods of treatment have been discussed with the patient and family. After consideration of risks, benefits and other options for treatment, the patient has consented to  Procedure(s): LEFT BREAST LUMPECTOMY WITH RADIOACTIVE SEED AND SENTINEL LYMPH NODE BIOPSY, INJECT BLUE DYE LEFT BREAST (Left) as a surgical intervention .  The patient's history has been reviewed, patient examined, no change in status, stable for surgery.  I have reviewed the patient's chart and labs.  Questions were answered to the patient's satisfaction.     [name]

## 2016-06-21 NOTE — Anesthesia Postprocedure Evaluation (Signed)
Anesthesia Post Note  Patient: [name]  Procedure(s) Performed: Procedure(s) (LRB): LEFT BREAST LUMPECTOMY WITH RADIOACTIVE SEED AND SENTINEL LYMPH NODE BIOPSY, INJECT BLUE DYE LEFT BREAST (Left)  Patient location during evaluation: PACU Anesthesia Type: General Level of consciousness: awake and alert and patient cooperative Pain management: pain level controlled Vital Signs Assessment: post-procedure vital signs reviewed and stable Respiratory status: spontaneous breathing and respiratory function stable Cardiovascular status: stable Anesthetic complications: no       Last Vitals:  Vitals:   06/21/16 1415 06/21/16 1422  BP:  129/79  Pulse: 73 75  Resp: 20   Temp:      Last Pain:  Vitals:   06/21/16 1105  TempSrc:   PainSc: 0-No pain                 Remington Skalsky S

## 2016-06-21 NOTE — Op Note (Signed)
Patient Name:           [name]   Date of Surgery:        06/21/2016  Pre op Diagnosis:      Grade 3 invasive ductal carcinoma left breast, upper outer quadrant, ER 30%, HER-2 positive, clinical stage TIb, N0  Post op Diagnosis:    Same  Procedure:                 Inject blue dye left breast                                      Left breast lumpectomy with radioactive seed localization                                      Left axillary deep sentinel lymph node mapping and biopsy  Surgeon:                     Edsel Petrin. Dalbert Batman, M.D., FACS  Assistant:                      OR staff   Indication for Assistant: n/a  Operative Indications:   This is a 65 year old Caucasian female, referred by Ammie Ferrier at the breast center of [facility] for evaluation and management of a new breast cancer of the left breast at the 9:00 position. Dr. Vivien Rossetti is her PCP. Dr. Fransico Him is her cardiologist. She is being evaluated in the [facility] today by Dr. Lindi Adie, Dr. Sondra Come and me       She has no prior breast problems. Gets annual mammograms. Recent mammograms showed category B density, 9 mm mass in the left breast at the 3:00 position, 14 cm from the nipple. Axillary ultrasound negative. Image guided biopsy shows grade 3 invasive ductal carcinoma, ER 30%, PR negative, Ki-67 40%, HER-2 positive.       Comorbidities include prior cholecystectomy by me. Prior umbilical hernia repair by me. History LVH with CHF, cardiac catheterization. Obesity. Right total knee replacement. Obstructive sleep apnea. Asthma. Depression. Benign hypertension. C-section. Maternal grandmother and maternal aunt had colon cancer. No family history of breast or ovarian cancer. She is divorced. Lives in [place]. Has 3 children. Denies tobacco. Denies alcohol. She is retired but used to work on the [employer] and then after that worked in [employer] for a while.       We have discussed her  options for intervention. We've talked about mastectomy with or without reconstruction, lumpectomy, sentinel lymph node biopsy. She clearly wants breast conservation I think she is a good candidate for that.     Dr. Lindi Adie will consider Herceptin chemotherapy if her cardiac status is good, otherwise he will probably hold off. Likely she will receive adjuvant whole breast radiation therapy. She is not a good candidate for MammoSite because the tumor is so close to the skin.       Once we receive cardiac clearance, she will be scheduled for left breast lumpectomy with radioactive seed localization and left axillary sentinel lymph node biopsy I discussed the indications, details, techniques, and numerous risk of the surgery with her and her family members. She is aware the risk of bleeding, infection, cosmetic deformity, reoperation for positive margins or positive nodes,  nerve damage with chronic pain, and other unforeseen problems. She understands all these issues well. All of her questions are answered. She agrees with this plan.   Operative Findings:      I was able to hear the radioactive seed with the neoprobe preop in the holding area.  The patient underwent left pectoral block by the anesthesia department.  Technetium 99 was injected into the left breast subareolar area by the nuclear medicine technician preop.  The breast cancer containing the radioactive seed was very superficial, less than 1 cm to the skin surface.  For this rate reason I took a fairly generous ellipse of skin overlying the tumor and took the dissection widely around this in hopes of getting a negative margin at the first operation.  The specimen mammogram looked good showing the clip and seed present within the specimen and the closest margin was the skin.  I discussed this with pathology.  I found 4 or 5 sentinel lymph nodes.  These were noticeably enlarged but clearly had lots of radioactivity in them.  Procedure in  Detail:          Following the induction of general endotracheal anesthesia, surgical timeout was performed, intravenous antibiotics were given.      Following alcohol prep I injected 5 mL of dilute methylene blue into the left breast subareolar area and massaged breast for a few minutes.      The left breast and axilla were then prepped and draped in a sterile fashion.  0.5% Marcaine was used as local infiltration anesthetic into both incisions       I made a transverse, curvilinear incision in the far lateral breast after defining the radioactivity with the neoprobe.  Dissection was carried widely around the radioactivity.  The specimen was removed and marked with silk sutures in a 6 color ink kit to orient the pathologist.  Specimen mammogram looked good as mentioned above.  Hemostasis excellent.  Wound was irrigated with saline.  5 metal clips were placed in the lumpectomy cavity.  The breast tissues were closed with 2-0 Vicryl, then 3-0 Vicryl, and the skin with subcuticular 4-0 Monocryl and Dermabond.      A transverse incision was made in a skin crease at the hairline of the left axilla.  Dissection was carried down through the subcutaneous tissue.  The clavipectoral fascia was incised.  Using the neoprobe I found several enlarged sentinel lymph nodes which had strong radioactivity and some blue color.  These were very deep in the axilla.  These were removed.  After removing about 5 sentinel lymph nodes I completed this dissection.  Hemostasis was excellent.  We had one venous bleeder that was controlled metal clips.  One blue liter was controlled with Vicryl suture ligature.  Wound was irrigated and the wound looked very clean.  The clavipectoral fascia was closed with 3-0 Vicryl sutures and skin closed with a running subcuticular 4-0 Monocryl and Dermabond.  Dry bandages, breast binder were placed.  The patient tolerated the procedure well was taken to PACU in stable condition.  EBL 30 mL.  Counts  correct.  Complications none.     Edsel Petrin. Dalbert Batman, M.D., FACS General and Minimally Invasive Surgery Breast and Colorectal Surgery  06/21/2016 12:48 PM

## 2016-06-22 ENCOUNTER — Encounter (HOSPITAL_COMMUNITY): Payer: Self-pay | Admitting: General Surgery

## 2016-06-25 ENCOUNTER — Telehealth: Payer: Commercial Managed Care - HMO | Admitting: Family

## 2016-06-25 DIAGNOSIS — J069 Acute upper respiratory infection, unspecified: Secondary | ICD-10-CM

## 2016-06-25 DIAGNOSIS — J029 Acute pharyngitis, unspecified: Secondary | ICD-10-CM

## 2016-06-25 MED ORDER — AMOXICILLIN-POT CLAVULANATE 875-125 MG PO TABS: 1.0000 | ORAL_TABLET | Freq: Two times a day (BID) | ORAL | 0 refills | Status: DC

## 2016-06-25 NOTE — Progress Notes (Signed)

## 2016-06-28 ENCOUNTER — Other Ambulatory Visit: Payer: Commercial Managed Care - HMO

## 2016-07-01 ENCOUNTER — Encounter: Payer: Self-pay | Admitting: Radiation Oncology

## 2016-07-01 NOTE — Progress Notes (Signed)
Location of Breast Cancer: upper-outer quadrant of left female breast   Histology per Pathology Report:   06/21/16 Diagnosis 1. Breast, lumpectomy, Left - INVASIVE DUCTAL CARCINOMA, GRADE III/III, SPANNING 1.1 CM. - THE SURGICAL RESECTION MARGINS ARE NEGATIVE FOR CARCINOMA. - LYMPHOVASCULAR INVASION IS IDENTIFIED. - SEE ONCOLOGY TABLE BELOW. 2. Lymph node, sentinel, biopsy, Left axillary - METASTATIC CARCINOMA IN 1 OF 1 LYMPH NODE (1/1), WITH FOCAL EXTRACAPSULAR EXTENSION. 3. Lymph node, sentinel, biopsy, Left axillary - THERE IS NO EVIDENCE OF CARCINOMA IN 1 OF 1 LYMPH NODE (0/1). 4. Lymph node, sentinel, biopsy, Left axillary - THERE IS NO EVIDENCE OF CARCINOMA IN 1 OF 1 LYMPH NODE (0/1). 5. Lymph node, sentinel, biopsy, Left axillary - THERE IS NO EVIDENCE OF CARCINOMA IN 1 OF 1 LYMPH NODE (0/1). 6. Lymph node, sentinel, biopsy, Left axillary - THERE IS NO EVIDENCE OF CARCINOMA IN 1 OF 1 LYMPH NODE (0/1). 7. Lymph node, sentinel, biopsy, Left axillary - THERE IS NO EVIDENCE OF CARCINOMA IN 1 OF 1 LYMPH NODE (0/1).  05/13/16 Diagnosis Breast, left, needle core biopsy, 3:00 o'clock - INVASIVE DUCTAL CARCINOMA.  Receptor Status: ER(30%), PR (0%), Her2-neu (positive), Ki-(40%)  Did patient present with symptoms (if so, please note symptoms) or was this found on screening mammography?: screening mammogram  Past/Anticipated interventions by surgeon, if any: 06/21/16 - Procedure: LEFT BREAST LUMPECTOMY WITH RADIOACTIVE SEED AND SENTINEL LYMPH NODE BIOPSY, INJECT BLUE DYE LEFT BREAST;  Surgeon: Fanny Skates, MD  Past/Anticipated interventions by medical oncology, if any: Per Dr. Lindi Adie "consideration for adjuvant Taxol with Herceptin if her cardiologist allows [name] to treat her with anti-HER-2 therapy this is only if the final tumor is greater than 5 mm in size. Followed by adjuvant radiation. Followed by adjuvant antiestrogen therapy with anastrozole 1 mg daily 5 years (bone density  04/29/2016 T score -1.2)."  She will start chemo on 07/25/16.  Lymphedema issues, if any:  no    Pain issues, if any:  Reports having pain underneath her left arm.  SAFETY ISSUES:  Prior radiation? no  Pacemaker/ICD? no  Possible current pregnancy?no  Is the patient on methotrexate? no  Current Complaints / other details:  Patient is here with her daughter.  She reports she had strep throat the day of surgery and is taking augmentin.  She has 1 day left and reports her throat is sore.  Vitals were taken in Dr. Geralyn Flash office today:  bp 161/84, hr 67, temperature 98.  Wt Readings from Last 3 Encounters:  07/03/16 (!) 326 lb 6.4 oz (148.1 kg)  07/03/16 (!) 326 lb 14.4 oz (148.3 kg)  06/21/16 (!) 328 lb 4.8 oz (148.9 kg)      Hess, Craige Cotta, RN 07/01/2016,2:52 PM

## 2016-07-02 NOTE — Assessment & Plan Note (Signed)
06/21/16: Left lumpectomy: IDC grade 3, 1.1 cm, 1/5 LN Positive with ECE, Margins Neg, LVI Present; Er 30%, PR 0%, Ki 67 40%, Her 2 Positive Ratio 2.71; T1CN1 (Stage 2B)  Patient has multiple comorbidities including history of LVH with congestive heart failure  Pathology counseling: I discussed the final pathology report of the patient provided  a copy of this report. I discussed the margins as well as lymph node surgeries. We also discussed the final staging along with previously performed ER/PR and HER-2/neu testing.  Treatment Plan: 1. consideration for adjuvant Taxol with Herceptin if her cardiologist allows [name] to treat her with anti-HER-2 therapy this is only if the final tumor is greater than 5 mm in size. 2. Followed by adjuvant radiation 3. Followed by adjuvant antiestrogen therapy with anastrozole 1 mg daily 5 years (bone density 04/29/2016 T score -1.2) -----------------------------------------------------------------------------------

## 2016-07-03 ENCOUNTER — Other Ambulatory Visit: Payer: Self-pay | Admitting: Hematology and Oncology

## 2016-07-03 ENCOUNTER — Ambulatory Visit
Admission: RE | Admit: 2016-07-03 | Discharge: 2016-07-03 | Disposition: A | Discharge status: Home / Self Care | Payer: Medicare HMO | Source: Ambulatory Visit | Attending: Radiation Oncology | Admitting: Radiation Oncology

## 2016-07-03 ENCOUNTER — Telehealth: Payer: Self-pay | Admitting: Oncology

## 2016-07-03 ENCOUNTER — Encounter: Payer: Self-pay | Admitting: Radiation Oncology

## 2016-07-03 ENCOUNTER — Ambulatory Visit (HOSPITAL_BASED_OUTPATIENT_CLINIC_OR_DEPARTMENT_OTHER): Payer: Medicare HMO | Admitting: Hematology and Oncology

## 2016-07-03 VITALS — BP 161/84 | HR 67 | Temp 98.0°F | Resp 17 | Ht 65.0 in | Wt 326.9 lb

## 2016-07-03 VITALS — Ht 65.0 in | Wt 326.4 lb

## 2016-07-03 DIAGNOSIS — C50412 Malignant neoplasm of upper-outer quadrant of left female breast: Secondary | ICD-10-CM

## 2016-07-03 DIAGNOSIS — Z7982 Long term (current) use of aspirin: Secondary | ICD-10-CM | POA: Diagnosis not present

## 2016-07-03 DIAGNOSIS — Z79899 Other long term (current) drug therapy: Secondary | ICD-10-CM | POA: Insufficient documentation

## 2016-07-03 DIAGNOSIS — I501 Left ventricular failure: Secondary | ICD-10-CM | POA: Diagnosis not present

## 2016-07-03 DIAGNOSIS — C773 Secondary and unspecified malignant neoplasm of axilla and upper limb lymph nodes: Secondary | ICD-10-CM

## 2016-07-03 DIAGNOSIS — Z170 Estrogen receptor positive status [ER+]: Secondary | ICD-10-CM | POA: Insufficient documentation

## 2016-07-03 DIAGNOSIS — I509 Heart failure, unspecified: Secondary | ICD-10-CM

## 2016-07-03 DIAGNOSIS — Z7951 Long term (current) use of inhaled steroids: Secondary | ICD-10-CM | POA: Insufficient documentation

## 2016-07-03 DIAGNOSIS — Z9889 Other specified postprocedural states: Secondary | ICD-10-CM | POA: Diagnosis not present

## 2016-07-03 MED ORDER — PROCHLORPERAZINE MALEATE 10 MG PO TABS: 10.0000 mg | ORAL_TABLET | Freq: Four times a day (QID) | ORAL | 1 refills | Status: DC | PRN

## 2016-07-03 MED ORDER — LIDOCAINE-PRILOCAINE 2.5-2.5 % EX CREA: TOPICAL_CREAM | CUTANEOUS | 3 refills | Status: DC

## 2016-07-03 MED ORDER — LORAZEPAM 0.5 MG PO TABS: 0.5000 mg | ORAL_TABLET | Freq: Four times a day (QID) | ORAL | 0 refills | Status: DC | PRN

## 2016-07-03 MED ORDER — ONDANSETRON HCL 8 MG PO TABS: 8.0000 mg | ORAL_TABLET | Freq: Two times a day (BID) | ORAL | 1 refills | Status: DC | PRN

## 2016-07-03 NOTE — Progress Notes (Signed)
Please see the Nurse Progress Note in the MD Initial Consult Encounter for this patient. 

## 2016-07-03 NOTE — Telephone Encounter (Signed)
Left a message for Woodward regarding her appointment with Dr. Sondra Come today.  Also talked to Ailene Ravel, RN, Dr. Geralyn Flash nurse to see if patient is in the [place].

## 2016-07-03 NOTE — Progress Notes (Signed)
Patient Care Team: [name], MD as PCP - General (Family Medicine) Fanny Skates, MD as Consulting Physician (General Surgery) Nicholas Lose, MD as Consulting Physician (Hematology and Oncology) Gery Pray, MD as Consulting Physician (Radiation Oncology)  DIAGNOSIS:  Encounter Diagnosis  Name Primary?  . Malignant neoplasm of upper-outer quadrant of left breast in female, estrogen receptor positive ([place])     SUMMARY OF ONCOLOGIC HISTORY:   Malignant neoplasm of upper-outer quadrant of left female breast ([place])   05/13/2016 Initial Diagnosis    Left breast biopsy 3:00: IDC, grade 3, ER 30%, PR 0%, Ki-67 40%, HER-2 positive ratio 2.71, screening detected left breast mass 9 x 7 x 6 mm, axilla negative, T1 BN 0 stage IA clinical stage      06/21/2016 Surgery    Left lumpectomy: IDC grade 3, 1.1 cm, 1/5 LN Positive with ECE, Margins Neg, LVI Present; Er 30%, PR 0%, Ki 67 40%, Her 2 Positive Ratio 2.71; T1CN1 (Stage 2B)       CHIEF COMPLIANT: Follow-up after recent lumpectomy  INTERVAL HISTORY: [name] is a 66 year old with above-mentioned history of left breast cancer treated with lumpectomy and is here today to discuss the pathology report. She is complaining of soreness from the recent surgery but otherwise doing quite well.  REVIEW OF SYSTEMS:   Constitutional: Denies fevers, chills or abnormal weight loss Eyes: Denies blurriness of vision Ears, nose, mouth, throat, and face: Denies mucositis or sore throat Respiratory: Denies cough, dyspnea or wheezes Cardiovascular: Denies palpitation, chest discomfort Gastrointestinal:  Denies nausea, heartburn or change in bowel habits Skin: Denies abnormal skin rashes Lymphatics: Denies new lymphadenopathy or easy bruising Neurological:Denies numbness, tingling or new weaknesses Behavioral/Psych: Mood is stable, no new changes  Extremities: No lower extremity edema Breast: Recent lumpectomy with sentinel lymph node  biopsy on 06/21/2006 on the left breast All other systems were reviewed with the patient and are negative.  I have reviewed the past medical history, past surgical history, social history and family history with the patient and they are unchanged from previous note.  ALLERGIES:  is allergic to no known allergies.  MEDICATIONS:  Current Outpatient Prescriptions  Medication Sig Dispense Refill  . acetaminophen (TYLENOL) 500 MG tablet Take 1,000 mg by mouth 2 (two) times daily as needed for moderate pain or headache.    . albuterol (PROVENTIL HFA;VENTOLIN HFA) 108 (90 Base) MCG/ACT inhaler Inhale 2 puffs into the lungs every 6 (six) hours as needed for wheezing or shortness of breath. 1 Inhaler 6  . amoxicillin-clavulanate (AUGMENTIN) 875-125 MG tablet Take 1 tablet by mouth 2 (two) times daily. 20 tablet 0  . aspirin EC 81 MG tablet Take 81 mg by mouth at bedtime.     . beclomethasone (QVAR) 80 MCG/ACT inhaler Inhale 1-2 puffs into the lungs 2 (two) times daily. (Patient taking differently: Inhale 1-2 puffs into the lungs 2 (two) times daily as needed (shortness of breath). ) 1 Inhaler 5  . cetirizine (ZYRTEC) 10 MG tablet Take 10 mg by mouth daily.    . clotrimazole-betamethasone (LOTRISONE) cream Apply 1 application topically 2 (two) times daily as needed (for rash). 30 g 0  . diclofenac (VOLTAREN) 75 MG EC tablet Take 1 tablet (75 mg total) by mouth 2 (two) times daily. 180 tablet 1  . diltiazem (CARDIZEM CD) 120 MG 24 hr capsule Take 1 capsule (120 mg total) by mouth daily. 90 capsule 2  . escitalopram (LEXAPRO) 20 MG tablet TAKE 1 TABLET ONE  TIME DAILY 90 tablet 4  . esomeprazole (NEXIUM) 40 MG capsule Take 40 mg by mouth daily.     . fexofenadine (ALLEGRA) 180 MG tablet Take 180 mg by mouth daily.    . fluticasone (FLONASE) 50 MCG/ACT nasal spray Place 1 spray into both nostrils daily as needed for allergies. 48 g 1  . furosemide (LASIX) 20 MG tablet Take 1 tablet (20 mg total) by mouth  daily. 90 tablet 2  . HYDROcodone-acetaminophen (NORCO) 5-325 MG tablet Take 1-2 tablets by mouth every 6 (six) hours as needed for moderate pain or severe pain. 30 tablet 0  . Hypromellose (ARTIFICIAL TEARS OP) Apply 1 drop to eye daily as needed (dry eyes).    Marland Kitchen losartan (COZAAR) 100 MG tablet Take 1 tablet (100 mg total) by mouth daily. 90 tablet 2  . montelukast (SINGULAIR) 10 MG tablet Take 1 tablet (10 mg total) by mouth at bedtime. 90 tablet 2  . nebivolol (BYSTOLIC) 10 MG tablet Take 1 tablet (10 mg total) by mouth at bedtime. 90 tablet 2  . rosuvastatin (CRESTOR) 40 MG tablet Take 1 tablet (40 mg total) by mouth daily. (Patient taking differently: Take 40 mg by mouth at bedtime. ) 90 tablet 3   No current facility-administered medications for this visit.     PHYSICAL EXAMINATION: ECOG PERFORMANCE STATUS: 1 - Symptomatic but completely ambulatory  Vitals:   07/03/16 1109  BP: (!) 161/84  Pulse: 67  Resp: 17  Temp: 98 F (36.7 C)   Filed Weights   07/03/16 1109  Weight: (!) 326 lb 14.4 oz (148.3 kg)    GENERAL:alert, no distress and comfortable SKIN: skin color, texture, turgor are normal, no rashes or significant lesions EYES: normal, Conjunctiva are pink and non-injected, sclera clear OROPHARYNX:no exudate, no erythema and lips, buccal mucosa, and tongue normal  NECK: supple, thyroid normal size, non-tender, without nodularity LYMPH:  no palpable lymphadenopathy in the cervical, axillary or inguinal LUNGS: clear to auscultation and percussion with normal breathing effort HEART: regular rate & [name] and no murmurs and no lower extremity edema ABDOMEN:abdomen soft, non-tender and normal bowel sounds MUSCULOSKELETAL:no cyanosis of digits and no clubbing  NEURO: alert & oriented x 3 with fluent speech, no focal motor/sensory deficits EXTREMITIES: No lower extremity edema  LABORATORY DATA:  I have reviewed the data as listed   Chemistry      Component Value Date/Time     NA 140 06/13/2016 1047   NA 140 05/22/2016 1211   K 4.2 06/13/2016 1047   K 4.3 05/22/2016 1211   CL 105 06/13/2016 1047   CO2 24 06/13/2016 1047   CO2 27 05/22/2016 1211   BUN 14 06/13/2016 1047   BUN 17.6 05/22/2016 1211   CREATININE 0.81 06/13/2016 1047   CREATININE 1.0 05/22/2016 1211      Component Value Date/Time   CALCIUM 9.2 06/13/2016 1047   CALCIUM 9.7 05/22/2016 1211   ALKPHOS 89 06/13/2016 1047   ALKPHOS 110 05/22/2016 1211   AST 51 (H) 06/13/2016 1047   AST 49 (H) 05/22/2016 1211   ALT 43 06/13/2016 1047   ALT 39 05/22/2016 1211   BILITOT 0.6 06/13/2016 1047   BILITOT 0.67 05/22/2016 1211       Lab Results  Component Value Date   WBC 7.1 06/13/2016   HGB 13.4 06/13/2016   HCT 42.4 06/13/2016   MCV 88.7 06/13/2016   PLT 279 06/13/2016   NEUTROABS 6.2 05/22/2016    ASSESSMENT & PLAN:  Malignant neoplasm of upper-outer quadrant of left female breast ([place]) 06/21/16: Left lumpectomy: IDC grade 3, 1.1 cm, 1/5 LN Positive with ECE, Margins Neg, LVI Present; Er 30%, PR 0%, Ki 67 40%, Her 2 Positive Ratio 2.71; T1CN1 (Stage 2B)  Patient has multiple comorbidities including history of LVH with congestive heart failure  Pathology counseling: I discussed the final pathology report of the patient provided  a copy of this report. I discussed the margins as well as lymph node surgeries. We also discussed the final staging along with previously performed ER/PR and HER-2/neu testing.  Treatment Plan: 1. consideration for adjuvant Taxol with Herceptin And Perjeta if her cardiologist allows [name] to treat her with anti-HER-2 therapy. (Last echocardiogram showed an ejection fraction of 55-60%) 2. Followed by adjuvant radiation 3. Followed by adjuvant antiestrogen therapy with anastrozole 1 mg daily 5 years (bone density 04/29/2016 T score -1.2) ----------------------------------------------------------------------------------- Chemotherapy counseling: I discussed with the  patient that we will give her a low-dose of Taxol at 50 mg/m. I discussed the risks and benefits of Taxol. Risks include risk of infection, fatigue, taste changes, hair loss, neuropathy as well as fatigue. I also discussed the risks and benefits of Herceptin and Perjeta including risk or heart heart function as well as Perjeta related diarrhea. Given instructions on how to take Imodium.  If Dr. [name] is agreeable with the chemotherapy plan then I will plan to start her on 07/25/2016. Taxol weekly and Herceptin and Perjeta will be every 3 weeks.  Radiation to start after chemotherapy is completed.   I spent 25 minutes talking to the patient of which more than half was spent in counseling and coordination of care.  No orders of the defined types were placed in this encounter.  The patient has a good understanding of the overall plan. she agrees with it. she will call with any problems that may develop before the next visit here.   Rulon Eisenmenger, MD 07/03/16

## 2016-07-03 NOTE — Addendum Note (Signed)
Addended by: Nicholas Lose on: 07/03/2016 03:48 PM   Modules accepted: Orders

## 2016-07-03 NOTE — Progress Notes (Signed)
START OFF PATHWAY REGIMEN - Breast  Off Pathway: Weekly paclitaxel + (trastuzumab + pertuzumab q21 days)  [id number]:Weekly paclitaxel + (trastuzumab + pertuzumab q21 days):   A cycle is every 21 days:     Pertuzumab (Perjeta(R)) 840 mg flat dose IV in 250 mL NS over 60 mins as a load cycle 1 only.  See observation note below. Dose Mod: None     Pertuzumab (Perjeta(R)) 420 mg flat dose IV in 250 mL NS over 30 mins.  An observation period of 30-60 mins is recommended after each dose prior to subsequent infusion of trastuzumab. Dose Mod: None     Trastuzumab (Herceptin(R)) 8 mg/kg IV in 250 mL NS over 90 mins as a load cycle 1 only. LVEF assessment recommended at baseline. Dose Mod: None     Trastuzumab (Herceptin(R)) 6 mg/kg IV in 250 mL NS over 30 mins. Recommended Monitoring: LVEF q3-4 months for adjuvant treatment, or with the development of cardiac symptoms and regimen changes for metastatic treatment. Dose Mod: None     Paclitaxel (Taxol(R)) 80 mg/m2 in 250 mL NS IV over 1 hour weekly Dose Mod: None  **Always confirm dose/schedule in your pharmacy ordering system**    Patient Characteristics: Adjuvant Therapy, Node Positive, HER2/neu Positive, ER Positive AJCC Stage Grouping: IIA Current Disease Status: No Distant Mets or Local Recurrence AJCC M Stage: 0 ER Status: Positive (+) AJCC N Stage: 1 AJCC T Stage: 1c HER2/neu: Positive (+) PR Status: Negative (-) Node Status: Positive (+) (1-3 Nodes)  Intent of Therapy: Curative Intent, Discussed with Patient

## 2016-07-03 NOTE — Progress Notes (Signed)
Radiation Oncology         [phone] ________________________________  Name: [name] MRN: [id number]  Date: 07/03/2016  DOB: 07-25-1950  Re-evaluation Note  CC: Penni Homans, MD  Nicholas Lose, MD    ICD-9-CM ICD-10-CM   1. Malignant neoplasm of upper-outer quadrant of left breast in female, estrogen receptor positive ([place]) 174.4 C50.412 Ambulatory referral to Social Work   V86.0 Z17.0     Diagnosis: Stage II-B (T1c, N1), grade 3, invasive ductal carcinoma of the left breast (ER+, PR-, HER2+)  Narrative:  The patient returns today for re-evaluation following breast clinic on 05/22/16. Since clinic, the patient underwent left breast lumpectomy this revealed invasive ductal carcinoma, grade III/III, spanning 1.1 cm, The margins were negative and lymphovascular invasion was identified. Left axillary sentinel lymph node biopsy was positive for metastatic carcinoma with focal extracapsular extension (1/1). Remaining sentinel lymph node biopsies revealed five benign lymph nodes with no evidence of carcinoma (0/5).  Dr. Lindi Adie recommends consideration for adjuvant Taxol with Herceptin if her cardiologist allows [name] to treat her with anti-HER-2 therapy.  Followed by adjuvant antiestrogen therapy with anastrozole 1 mg daily for five years.     Patient reports pain underneath her left arm. She denies lymphedema at this time. She reports contracting strep throat the day of her surgery and she is taking Augmentin with one day left. She is positive for a sore throat.               ALLERGIES:  is allergic to no known allergies.  Meds: Current Outpatient Prescriptions  Medication Sig Dispense Refill  . acetaminophen (TYLENOL) 500 MG tablet Take 1,000 mg by mouth 2 (two) times daily as needed for moderate pain or headache.    . albuterol (PROVENTIL HFA;VENTOLIN HFA) 108 (90 Base) MCG/ACT inhaler Inhale 2 puffs into the lungs every 6 (six) hours as needed for wheezing or shortness of  breath. 1 Inhaler 6  . amoxicillin-clavulanate (AUGMENTIN) 875-125 MG tablet Take 1 tablet by mouth 2 (two) times daily. 20 tablet 0  . aspirin EC 81 MG tablet Take 81 mg by mouth at bedtime.     . beclomethasone (QVAR) 80 MCG/ACT inhaler Inhale 1-2 puffs into the lungs 2 (two) times daily. (Patient taking differently: Inhale 1-2 puffs into the lungs 2 (two) times daily as needed (shortness of breath). ) 1 Inhaler 5  . clotrimazole-betamethasone (LOTRISONE) cream Apply 1 application topically 2 (two) times daily as needed (for rash). 30 g 0  . diclofenac (VOLTAREN) 75 MG EC tablet Take 1 tablet (75 mg total) by mouth 2 (two) times daily. 180 tablet 1  . diltiazem (CARDIZEM CD) 120 MG 24 hr capsule Take 1 capsule (120 mg total) by mouth daily. 90 capsule 2  . escitalopram (LEXAPRO) 20 MG tablet TAKE 1 TABLET ONE TIME DAILY 90 tablet 4  . esomeprazole (NEXIUM) 40 MG capsule Take 40 mg by mouth daily.     . fexofenadine (ALLEGRA) 180 MG tablet Take 180 mg by mouth daily.    . fluticasone (FLONASE) 50 MCG/ACT nasal spray Place 1 spray into both nostrils daily as needed for allergies. 48 g 1  . furosemide (LASIX) 20 MG tablet Take 1 tablet (20 mg total) by mouth daily. 90 tablet 2  . Hypromellose (ARTIFICIAL TEARS OP) Apply 1 drop to eye daily as needed (dry eyes).    Marland Kitchen losartan (COZAAR) 100 MG tablet Take 1 tablet (100 mg total) by mouth daily. 90 tablet 2  .  montelukast (SINGULAIR) 10 MG tablet Take 1 tablet (10 mg total) by mouth at bedtime. 90 tablet 2  . nebivolol (BYSTOLIC) 10 MG tablet Take 1 tablet (10 mg total) by mouth at bedtime. 90 tablet 2  . rosuvastatin (CRESTOR) 40 MG tablet Take 1 tablet (40 mg total) by mouth daily. (Patient taking differently: Take 40 mg by mouth at bedtime. ) 90 tablet 3  . cetirizine (ZYRTEC) 10 MG tablet Take 10 mg by mouth daily.    Marland Kitchen HYDROcodone-acetaminophen (NORCO) 5-325 MG tablet Take 1-2 tablets by mouth every 6 (six) hours as needed for moderate pain or  severe pain. (Patient not taking: Reported on 07/03/2016) 30 tablet 0   No current facility-administered medications for this encounter.     Physical Findings: The patient is in no acute distress. Patient is alert and oriented.  height is '5\' 5"'  (1.651 m) and weight is 326 lb 6.4 oz (148.1 kg) (abnormal). .  No significant changes.  Lungs are clear to auscultation bilaterally. Heart has regular rate and rhythm. No palpable cervical, supraclavicular, or axillary adenopathy. Abdomen soft, non-tender, normal bowel sounds. Left breast well healing scar in LOQ without signs of drainage or infection. Separate scar noted in the axillary region from sentinel lymph node procedure.  Lab Findings: Lab Results  Component Value Date   WBC 7.1 06/13/2016   HGB 13.4 06/13/2016   HCT 42.4 06/13/2016   MCV 88.7 06/13/2016   PLT 279 06/13/2016    Radiographic Findings: Nm Sentinel Node Inj-no Rpt (breast)  Result Date: 07/03/2016 Sulfur colloid was injected intradermally by the nuclear medicine technologist for breast cancer sentinel node localization  Mm Breast Surgical Specimen  Result Date: 06/21/2016 CLINICAL DATA:  Biopsy-proven grade 3 invasive ductal carcinoma involving the outer left breast, superficial in location. Radioactive seed localization was performed 06/18/2016. Lumpectomy is performed today. EXAM: SPECIMEN RADIOGRAPH OF THE LEFT BREAST COMPARISON:  Previous exam(s). FINDINGS: Status post excision of the left breast. The radioactive seed and ribbon shaped biopsy marker clip are present, completely intact, and are marked for pathology. This was discussed by telephone with the operating room nurse and with Dr. Dalbert Batman at the time of interpretation on 06/21/2016 at 12:10 p.m. IMPRESSION: Specimen radiograph of the left breast. Electronically Signed   By: Evangeline Dakin M.D.   On: 06/21/2016 12:14   Mm Lt Radioactive Seed Loc Mammo Guide  Result Date: 06/18/2016 CLINICAL DATA:  The patient  presents for radioactive seed localization of a left breast carcinoma. EXAM: MAMMOGRAPHIC GUIDED RADIOACTIVE SEED LOCALIZATION OF THE LEFT BREAST COMPARISON:  Previous exam(s). FINDINGS: Patient presents for radioactive seed localization prior to surgical excision. I met with the patient and we discussed the procedure of seed localization including benefits and alternatives. We discussed the high likelihood of a successful procedure. We discussed the risks of the procedure including infection, bleeding, tissue injury and further surgery. We discussed the low dose of radioactivity involved in the procedure. Informed, written consent was given. The usual time-out protocol was performed immediately prior to the procedure. Using mammographic guidance, sterile technique, 1% lidocaine and an I-125 radioactive seed, the small mass and biopsy clip were localized using a lateral approach. The follow-up mammogram images confirm the seed in the expected location and were marked for Dr. Dalbert Batman. Follow-up survey of the patient confirms presence of the radioactive seed. Order number of I-125 seed:  [id number]. Total activity:  0.962 millicuries  Reference Date: 05/28/2016 The patient tolerated the procedure well and was released from  the [facility]. She was given instructions regarding seed removal. IMPRESSION: Radioactive seed localization of the left breast. No apparent complications. Electronically Signed   By: Lajean Manes M.D.   On: 06/18/2016 15:51    Impression: Stage II-B (T1c, N1), grade 3, invasive ductal carcinoma of the left breast (ER+, PR-, HER2+). The patient is a good candidate for breast conservation therapy with radiation therapy directed to the left breast. In light of the positive lymph node, I would recommend coverage of the axillary region at the same time of her breast radiation treatments. I discussed the course of treatment, side effects, and potential toxicities with the patient and her daughter.  She appears to understand and wishes to proceed with treatment. A consent form was signed and a copy was placed in the patient's chart.  Plan:  The patient will be seen again once she finishes the Taxol-based portion of her chemotherapy.  ____________________________________    This document serves as a record of services personally performed by Gery Pray, MD. It was created on his behalf by Bethann Humble, a trained medical scribe. The creation of this record is based on the scribe's personal observations and the provider's statements to them. This document has been checked and approved by the attending provider.

## 2016-07-04 ENCOUNTER — Ambulatory Visit: Payer: Commercial Managed Care - HMO | Admitting: Hematology and Oncology

## 2016-07-04 ENCOUNTER — Other Ambulatory Visit: Payer: Medicare HMO | Admitting: *Deleted

## 2016-07-04 DIAGNOSIS — E785 Hyperlipidemia, unspecified: Secondary | ICD-10-CM | POA: Diagnosis not present

## 2016-07-04 DIAGNOSIS — I10 Essential (primary) hypertension: Secondary | ICD-10-CM

## 2016-07-04 DIAGNOSIS — E7800 Pure hypercholesterolemia, unspecified: Secondary | ICD-10-CM

## 2016-07-05 ENCOUNTER — Other Ambulatory Visit: Payer: Self-pay | Admitting: General Surgery

## 2016-07-05 ENCOUNTER — Encounter: Payer: Self-pay | Admitting: *Deleted

## 2016-07-05 NOTE — Progress Notes (Signed)
[facility] Psychosocial Distress Screening Clinical Social Work  Clinical Social Work was referred by distress screening protocol.  The patient scored a 5 on the Psychosocial Distress Thermometer which indicates moderate distress. Clinical Social Worker reviewed chart and contacted pt via phone to assess for distress and other psychosocial needs. CSW introduced self and explained role of CSW/Support Team. Pt shared she has been quite anxious, but this has decreased with her now having a better understanding of her treatment plan. She shared she is sleeping better as a result. She is not interested in taking medication for anxiety at this time and has been managing her anxiety through reading and support from friends and family. Pt has strong support from her daughters and church family. They have provided her with journaling tools and inspirational reading that has been helpful to her. CSW reviewed additional Support Programs to assist and how to contact CSW team as needed.   ONCBCN DISTRESS SCREENING 07/03/2016  Screening Type Initial Screening  Distress experienced in past week (1-10) 5  Practical problem type   Family Problem type   Emotional problem type Nervousness/Anxiety;Adjusting to illness  Spiritual/Religous concerns type   Information Concerns Type   Physical Problem type Sleep/insomnia  Referral to support programs      Clinical Social Worker follow up needed: No.  If yes, follow up plan:  Loren Racer, [place]  [name] Phone: [phone] Fax: [phone]

## 2016-07-11 ENCOUNTER — Other Ambulatory Visit: Payer: Medicare HMO

## 2016-07-11 NOTE — Pre-Procedure Instructions (Signed)
TENAJA OHLE  07/11/2016      [facility] [place], [place] [address] Phone: [phone] Fax: [phone] [place], [place] - [name] [facility] [place] [address] Phone: [phone] Fax: [phone]    Your procedure is scheduled on January 23  Report to [place] at 0600 A.M.  Call this number if you have problems the morning of surgery:  [phone]   Remember:  Do not eat food or drink liquids after midnight.   Take these medicines the morning of surgery with A SIP OF WATER acetaminophen (TYLENOL), albuterol (PROVENTIL HFA;VENTOLIN HFA), beclomethasone (QVAR), diltiazem (CARDIZEM CD), escitalopram (LEXAPRO), esomeprazole (NEXIUM), fexofenadine (ALLEGRA), fluticasone (FLONASE), HYDROcodone-acetaminophen (NORCO), eye drops, ondansetron (ZOFRAN)  7 days prior to surgery STOP taking any Aspirin, Aleve, Naproxen, Ibuprofen, Motrin, Advil, Goody's, BC's, all herbal medications, fish oil, and all vitamins     Do not wear jewelry, make-up or nail polish.  Do not wear lotions, powders, or perfumes, or deoderant.  Do not shave 48 hours prior to surgery.    Do not bring valuables to the hospital.  [facility] is not responsible for any belongings or valuables.  Contacts, dentures or bridgework may not be worn into surgery.  Leave your suitcase in the car.  After surgery it may be brought to your room.  For patients admitted to the hospital, discharge time will be determined by your treatment team.  Patients discharged the day of surgery will not be allowed to drive home.    Special instructions:   [facility]- Preparing For Surgery  Before surgery, you can play an important role. Because skin is not sterile, your skin needs to be as free of germs as possible. You can reduce the number of germs on your skin by washing with CHG  (chlorahexidine gluconate) Soap before surgery.  CHG is an antiseptic cleaner which kills germs and bonds with the skin to continue killing germs even after washing.  Please do not use if you have an allergy to CHG or antibacterial soaps. If your skin becomes reddened/irritated stop using the CHG.  Do not shave (including legs and underarms) for at least 48 hours prior to first CHG shower. It is OK to shave your face.  Please follow these instructions carefully.   1. Shower the NIGHT BEFORE SURGERY and the MORNING OF SURGERY with CHG.   2. If you chose to wash your hair, wash your hair first as usual with your normal shampoo.  3. After you shampoo, rinse your hair and body thoroughly to remove the shampoo.  4. Use CHG as you would any other liquid soap. You can apply CHG directly to the skin and wash gently with a scrungie or a clean washcloth.   5. Apply the CHG Soap to your body ONLY FROM THE NECK DOWN.  Do not use on open wounds or open sores. Avoid contact with your eyes, ears, mouth and genitals (private parts). Wash genitals (private parts) with your normal soap.  6. Wash thoroughly, paying special attention to the area where your surgery will be performed.  7. Thoroughly rinse your body with warm water from the neck down.  8. DO NOT shower/wash with your normal soap after using and rinsing off the CHG Soap.  9. Pat yourself dry with a CLEAN TOWEL.   10. Wear CLEAN PAJAMAS   11.  Place CLEAN SHEETS on your bed the night of your first shower and DO NOT SLEEP WITH PETS.    Day of Surgery: Do not apply any deodorants/lotions. Please wear clean clothes to the hospital/surgery center.      Please read over the following fact sheets that you were given.

## 2016-07-12 ENCOUNTER — Encounter (HOSPITAL_COMMUNITY): Payer: Self-pay

## 2016-07-12 ENCOUNTER — Inpatient Hospital Stay (HOSPITAL_COMMUNITY)
Admission: RE | Admit: 2016-07-12 | Discharge: 2016-07-12 | Disposition: A | Discharge status: Home / Self Care | Payer: Medicare HMO | Source: Ambulatory Visit

## 2016-07-12 ENCOUNTER — Telehealth: Payer: Self-pay

## 2016-07-12 DIAGNOSIS — I5032 Chronic diastolic (congestive) heart failure: Secondary | ICD-10-CM

## 2016-07-12 NOTE — Telephone Encounter (Signed)
-----  Message from Traci R Turner, MD sent at 07/03/2016  2:17 PM EST ----- Please order an echo for 10/23/2016 for chemotherapy followup  Traci TUrner, MD ----- Message ----- From: Vinay Gudena, MD Sent: 07/03/2016  12:04 PM To: Traci R Turner, MD  Dr. Turner, I am the breast oncologist taking care of [name]. She did very well from the recent lumpectomy surgery. She has HER-2 positive disease and I recommended chemotherapy with Taxol with Herceptin and Perjeta. I reviewed her last echocardiogram which showed an ejection fraction of 55-60%. I want to make sure that she is okay to receive Herceptin and Perjeta antibody therapy along with Taxol. I intend to start treatment on 07/25/2016. She will need every 3 month echocardiograms under your guidance. Please let me know your thoughts Thank you very much Vinay  

## 2016-07-12 NOTE — Telephone Encounter (Signed)
ECHO ordered to be scheduled 10/23/16.  Left message for patient that schedulers will be in touch to arrange appointment for ECHO.

## 2016-07-12 NOTE — Progress Notes (Signed)
Patient called to verify history and instructions before surgery.  Patient verbalized understanding. Patient had CHG Soap from last surgery in December gave instructions on how to use that   Patient had lab work drew at Dr. Landis Gandy office on 07/03/16 results are not in [place] yet.  Will continue to follow up with those labs may need to call office if labs are not resulted in Epic soon  Patient wears CPAP at home

## 2016-07-14 NOTE — H&P (Signed)
[name] Location: [facility] Surgery Patient #: [id number] DOB: [birth year] Divorced / Language: English / Race: White Female         History of Present Illness        This is a 66 year old female who returns with her daughter for her first postop visit following definitive surgical management of her left breast cancer. Dr. Lindi Adie and Dr. Sondra Come have evaluated her at the [facility]. Dr. Fransico Him is her cardiologist. Dr. Gwyneth Revels is her PCP.      On June 21, 2016 she underwent left lumpectomy and sentinel node biopsy. Final pathology shows a 1.1 cm invasive ductal carcinoma, high-grade, negative margins, positive LVI. Metastatic cancer in 1 out of 6 lymph nodes but there was positive extracapsular extension. ER 30%. PR negative. HER-2 positive. Stage TIc, N1.        Dr. Lindi Adie has advised chemotherapy and she agrees with this She is scheduled for Port-A-Cath insertion on January 23. She is symptomatically doing well from her left breast and axillary surgery.      I have discussed indications, details, techniques, and numerous risk of Port-A-Cath insertion. She is aware the risks of bleeding, infection, air embolus, pneumothorax, failure to insert the catheter requiring bilateral attempts. Interval malfunction requiring revision, total failure of attempt requiring interventional radiology assistance. She knows that we might choose the subclavian or the internal jugular route. She knows this will be somewhat difficult because of her large body habitus. She understands all these issues. All of her questions are answered. She agrees with this plan.  If everything goes well, I will see her back in 6 months   Allergies  No Known Drug Allergies Allergies Reconciled   Medication History  Allegra (30MG Tablet, Oral) Active. Protonix (40MG Tablet DR, Oral) Active. Lexapro (10MG Tablet, Oral) Active. Voltaren (75MG Tablet DR, Oral) Active. Cardizem  CD (120MG Capsule ER 24HR, Oral) Active. Lasix (20MG Tablet, Oral) Active. Cozaar (100MG Tablet, Oral) Active. Singulair (10MG Tablet, Oral) Active. Bystolic (41DQ Tablet, Oral) Active. Crestor (20MG Tablet, Oral) Active. Albuterol (90MCG/ACT Aerosol Soln, Inhalation) Active. Qvar (40MCG/ACT Aerosol Soln, Inhalation) Active. Medications Reconciled  Vitals  Weight: 328.6 lb Height: 65in Body Surface Area: 2.44 m Body Mass Index: 54.68 kg/m  Temp.: 98.71F  Pulse: 72 (Regular)  BP: 142/78 (Sitting, Left Arm, Standard)     Physical Exam  General Note: Very pleasant. No distress. Daughter is with her. Mental status normal.   Head and Neck Note: Neck is soft. Thick. Thyroid slightly enlarged. No adenopathy. Reasonable range of motion. Short neck.   Chest and Lung Exam Note: Clear to auscultation bilaterally. No chest wall tenderness.   Breast Note: Breasts are large. Lumpectomy incision laterally healing nicely. Axillary incision healing nicely. Tissues are soft. No sign of infection, seroma, or palpable hematoma. Range of motion left shoulder good.   Musculoskeletal Note: Chronic edema all 4 extremities.     Assessment & Plan PRIMARY CANCER OF UPPER OUTER QUADRANT OF LEFT FEMALE BREAST (C50.412)    You are recovering from your left breast lumpectomy and axillary sentinel node biopsy without any obvious surgical complications Dr. Lindi Adie has advised you to undergo chemotherapy and you have agreed to this You are scheduled for Port-A-Cath insertion on January 23 We have discussed the indications, techniques, and risks of the surgery in detail.  Assuming that all of this goes well, I will see you back in approximately 6 months. Sooner if needed.  Advised patient to stop ASA, anticoagulant,  blood thinners, and NSAIDs Three (3) days prior to surgery.  HYPERTENSIVE LEFT VENTRICULAR HYPERTROPHY, WITH HEART FAILURE (I11.0) HYPERTENSION, BENIGN  (I10) SLEEP APNEA IN ADULT (G47.30) ASTHMA IN ADULT (J45.909) OSTEOARTHRITIS OF RIGHT KNEE, UNSPECIFIED OSTEOARTHRITIS TYPE (M17.11) GOITER, SIMPLE (E04.0) FAMILY HISTORY OF COLON CANCER (Z80.0) HISTORY OF UMBILICAL HERNIA REPAIR (Z98.890) BMI 38.0-38.9,ADULT (Z68.38)   Edsel Petrin. Dalbert Batman, M.D., [facility] General and Minimally invasive Surgery Breast and Colorectal Surgery Office:   [phone] Pager:   [phone]

## 2016-07-15 ENCOUNTER — Other Ambulatory Visit: Payer: Medicare HMO

## 2016-07-15 ENCOUNTER — Encounter (HOSPITAL_COMMUNITY): Payer: Self-pay | Admitting: Anesthesiology

## 2016-07-15 MED ORDER — DEXTROSE 5 % IV SOLN
3.0000 g | INTRAVENOUS | Status: AC
  Administered 2016-07-16: 3 g via INTRAVENOUS
  Filled 2016-07-15: qty 3000

## 2016-07-15 NOTE — Anesthesia Preprocedure Evaluation (Addendum)
Anesthesia Evaluation    History of Anesthesia Complications (+) PONV and history of anesthetic complications  Airway Mallampati: II       Dental  (+) Teeth Intact, Caps,    Pulmonary shortness of breath, with exertion, at rest and lying, asthma , sleep apnea ,    Pulmonary exam normal breath sounds clear to auscultation       Cardiovascular hypertension, Pt. on medications and Pt. on home beta blockers + CAD and +CHF  Normal cardiovascular examSupra Ventricular Tachycardia + Valvular Problems/Murmurs  Rhythm:Regular Rate:Normal     Neuro/Psych PSYCHIATRIC DISORDERS Anxiety negative neurological ROS     GI/Hepatic GERD  Medicated and Controlled,  Endo/Other  Hyperlipidemia Left Breast Ca   Renal/GU Renal disease  negative genitourinary   Musculoskeletal  (+) Arthritis , Osteoarthritis,    Abdominal (+) + obese,   Peds  Hematology  (+) anemia ,   Anesthesia Other Findings   Reproductive/Obstetrics                             Lab Results  Component Value Date   WBC 5.9 07/16/2016   HGB 13.2 07/16/2016   HCT 42.3 07/16/2016   MCV 88.1 07/16/2016   PLT 260 07/16/2016     Chemistry      Component Value Date/Time   NA 140 06/13/2016 1047   NA 140 05/22/2016 1211   K 4.2 06/13/2016 1047   K 4.3 05/22/2016 1211   CL 105 06/13/2016 1047   CO2 24 06/13/2016 1047   CO2 27 05/22/2016 1211   BUN 14 06/13/2016 1047   BUN 17.6 05/22/2016 1211   CREATININE 0.81 06/13/2016 1047   CREATININE 1.0 05/22/2016 1211      Component Value Date/Time   CALCIUM 9.2 06/13/2016 1047   CALCIUM 9.7 05/22/2016 1211   ALKPHOS 89 06/13/2016 1047   ALKPHOS 110 05/22/2016 1211   AST 51 (H) 06/13/2016 1047   AST 49 (H) 05/22/2016 1211   ALT 43 06/13/2016 1047   ALT 39 05/22/2016 1211   BILITOT 0.6 06/13/2016 1047   BILITOT 0.67 05/22/2016 1211     EKG: sinus bradycardia, incomplete RBBB, frequent PVC's  noted, septal infarct .  Anesthesia Physical Anesthesia Plan  ASA: III  Anesthesia Plan: General   Post-op Pain Management:    Induction: Intravenous  Airway Management Planned: Oral ETT  Additional Equipment:   Intra-op Plan:   Post-operative Plan: Extubation in OR  Informed Consent: I have reviewed the patients History and Physical, chart, labs and discussed the procedure including the risks, benefits and alternatives for the proposed anesthesia with the patient or authorized representative who has indicated his/her understanding and acceptance.   Dental advisory given  Plan Discussed with: CRNA, Anesthesiologist and Surgeon  Anesthesia Plan Comments:        Anesthesia Quick Evaluation

## 2016-07-15 NOTE — Progress Notes (Signed)
Anesthesia chart review: Same day workup. (Patient missed 07/12/2016 PAT visit.)  Patient is a 66 year old female scheduled for insertion of Port-A-Cath with ultrasound on 07/16/2016 by Dr. Dalbert Batman.  History includes left breast cancer s/p lumpectomy 06/21/16, never smoker, post-operative N/V, GERD, chronic dialstolic CHF, infant murmur, exertional dyspnea, atrial tachycardia (suppressed on CCB and BB), PVCs (asymptomatic; on BB and CCB), mild CAD (20% mid LAD, 20% proximal CX, 20% ostial RCA '15), HTN, HLD, anxiety, OSA (CPAP), morbid obesity, elevated LFTs '13, goiter, ventral hernia, cholecystectomy, left TKA '11, right TKA '15.   PCP is Dr. Penni Homans. Cardiologist is Dr. Fransico Him. Hem-Onc is Dr. Lindi Adie. She will be getting an echo in 3 months for chemotherapy follow-up (to start Taxol, Herceptin, Perjeta). Rad-Onc is Dr. Sondra Come.  Meds include albuterol, aspirin 81 mg, Qvar, Cardizem CD, Lexapro, Nexium, Allegra, Flonase, Lasix, losartan, Ativan, Singulair, Bystolic, Crestor.  EKG 05/02/16 ([facility]): SB at 56 bpm, frequent PVC in a bigeminy pattern, LAFB, septal infarct (age undetermined).  Echo 05/29/16: Impressions: - Normal LV size with mild LV hypertrophy. EF 55-60%. Normal RV   size and systolic function. No significant valvular   abnormalities.  24 Hour Holter monitor 05/24/16:  Normal Sinus Rhythm with average heart rate 61bpm. The heart rate ranged from 45 to 99bpm.  Frequent PACs, atrial couplets and nonsustained atrial tachycardia up to 8 beats.  Occasional PVCs and ventricular couplets.  Cardiac cath 11/08/13: Left main: No obstructive disease.  Left Anterior Descending Artery: Large caliber vessel that courses to the apex. 20% mid stenosis. Moderate caliber first diagonal branch with ostial 20% stenosis.  Circumflex Artery: Large caliber vessel with two moderate caliber OM branches. 20% proximal stenosis.  Right Coronary Artery: Large dominant vessel with 20%  ostial stenosis.  Left Ventricular Angiogram: Deferred.  Impression: 1. Mild non-obstructive CAD 2. Elevated filling pressures.  3. Dyspnea Recommendations: Medical management of mild CAD with statin. Her dyspnea is likely multi-factorial but she may have some improvement in symptoms with mild diuresis. Will discuss with Dr. [name].   She will need labs on arrival. If labs acceptable and otherwise no acute changes then I would anticipate that she can perceive as planned.  George Hugh [facility] Short Stay Center/Anesthesiology Phone [phone] 07/15/2016 11:15 AM

## 2016-07-15 NOTE — Telephone Encounter (Signed)
[facility] has scheduled ECHO 5/2.

## 2016-07-16 ENCOUNTER — Encounter (HOSPITAL_COMMUNITY): Payer: Self-pay | Admitting: Anesthesiology

## 2016-07-16 ENCOUNTER — Ambulatory Visit (HOSPITAL_COMMUNITY): Payer: Medicare HMO

## 2016-07-16 ENCOUNTER — Encounter (HOSPITAL_COMMUNITY)
Admission: RE | Disposition: A | Discharge status: Home / Self Care | Payer: Self-pay | Source: Ambulatory Visit | Attending: General Surgery

## 2016-07-16 ENCOUNTER — Ambulatory Visit (HOSPITAL_COMMUNITY): Payer: Medicare HMO | Admitting: Vascular Surgery

## 2016-07-16 ENCOUNTER — Ambulatory Visit (HOSPITAL_COMMUNITY)
Admission: RE | Admit: 2016-07-16 | Discharge: 2016-07-16 | Disposition: A | Discharge status: Home / Self Care | Payer: Medicare HMO | Source: Ambulatory Visit | Attending: General Surgery | Admitting: General Surgery

## 2016-07-16 DIAGNOSIS — C50412 Malignant neoplasm of upper-outer quadrant of left female breast: Secondary | ICD-10-CM | POA: Diagnosis present

## 2016-07-16 DIAGNOSIS — I2510 Atherosclerotic heart disease of native coronary artery without angina pectoris: Secondary | ICD-10-CM | POA: Insufficient documentation

## 2016-07-16 DIAGNOSIS — E785 Hyperlipidemia, unspecified: Secondary | ICD-10-CM | POA: Insufficient documentation

## 2016-07-16 DIAGNOSIS — Z95828 Presence of other vascular implants and grafts: Secondary | ICD-10-CM

## 2016-07-16 DIAGNOSIS — I5032 Chronic diastolic (congestive) heart failure: Secondary | ICD-10-CM | POA: Insufficient documentation

## 2016-07-16 DIAGNOSIS — I110 Hypertensive heart disease with heart failure: Secondary | ICD-10-CM | POA: Insufficient documentation

## 2016-07-16 DIAGNOSIS — J45909 Unspecified asthma, uncomplicated: Secondary | ICD-10-CM | POA: Diagnosis not present

## 2016-07-16 DIAGNOSIS — D649 Anemia, unspecified: Secondary | ICD-10-CM | POA: Diagnosis not present

## 2016-07-16 DIAGNOSIS — Z96653 Presence of artificial knee joint, bilateral: Secondary | ICD-10-CM | POA: Diagnosis not present

## 2016-07-16 DIAGNOSIS — M1990 Unspecified osteoarthritis, unspecified site: Secondary | ICD-10-CM | POA: Insufficient documentation

## 2016-07-16 DIAGNOSIS — Z170 Estrogen receptor positive status [ER+]: Secondary | ICD-10-CM | POA: Diagnosis not present

## 2016-07-16 DIAGNOSIS — Z79899 Other long term (current) drug therapy: Secondary | ICD-10-CM | POA: Diagnosis not present

## 2016-07-16 DIAGNOSIS — C50911 Malignant neoplasm of unspecified site of right female breast: Secondary | ICD-10-CM | POA: Diagnosis not present

## 2016-07-16 DIAGNOSIS — Z4682 Encounter for fitting and adjustment of non-vascular catheter: Secondary | ICD-10-CM | POA: Diagnosis not present

## 2016-07-16 DIAGNOSIS — G4733 Obstructive sleep apnea (adult) (pediatric): Secondary | ICD-10-CM | POA: Diagnosis not present

## 2016-07-16 DIAGNOSIS — Z6841 Body Mass Index (BMI) 40.0 and over, adult: Secondary | ICD-10-CM | POA: Diagnosis not present

## 2016-07-16 DIAGNOSIS — I10 Essential (primary) hypertension: Secondary | ICD-10-CM | POA: Diagnosis not present

## 2016-07-16 DIAGNOSIS — Z419 Encounter for procedure for purposes other than remedying health state, unspecified: Secondary | ICD-10-CM

## 2016-07-16 DIAGNOSIS — Z452 Encounter for adjustment and management of vascular access device: Secondary | ICD-10-CM | POA: Diagnosis not present

## 2016-07-16 HISTORY — PX: PORTACATH PLACEMENT: SHX2246

## 2016-07-16 LAB — COMPREHENSIVE METABOLIC PANEL
ALBUMIN: 3.5 g/dL (ref 3.5–5.0)
ALT: 44 U/L (ref 14–54)
AST: 50 U/L — AB (ref 15–41)
Alkaline Phosphatase: 84 U/L (ref 38–126)
Anion gap: 9 (ref 5–15)
BILIRUBIN TOTAL: 0.5 mg/dL (ref 0.3–1.2)
BUN: 15 mg/dL (ref 6–20)
CHLORIDE: 104 mmol/L (ref 101–111)
CO2: 26 mmol/L (ref 22–32)
CREATININE: 0.85 mg/dL (ref 0.44–1.00)
Calcium: 9.9 mg/dL (ref 8.9–10.3)
GFR calc Af Amer: >60 mL/min (ref >60)
GLUCOSE: 133 mg/dL — AB (ref 65–99)
Potassium: 3.9 mmol/L (ref 3.5–5.1)
Sodium: 139 mmol/L (ref 135–145)
TOTAL PROTEIN: 7.5 g/dL (ref 6.5–8.1)

## 2016-07-16 LAB — CBC
HEMATOCRIT: 42.3 % (ref 36.0–46.0)
Hemoglobin: 13.2 g/dL (ref 12.0–15.0)
MCH: 27.5 pg (ref 26.0–34.0)
MCHC: 31.2 g/dL (ref 30.0–36.0)
MCV: 88.1 fL (ref 78.0–100.0)
Platelets: 260 10*3/uL (ref 150–400)
RBC: 4.8 MIL/uL (ref 3.87–5.11)
RDW: 15.1 % (ref 11.5–15.5)
WBC: 5.9 10*3/uL (ref 4.0–10.5)

## 2016-07-16 SURGERY — INSERTION, TUNNELED CENTRAL VENOUS DEVICE, WITH PORT
Anesthesia: General | Site: Chest | Laterality: Right

## 2016-07-16 MED ORDER — ROCURONIUM BROMIDE 50 MG/5ML IV SOSY
PREFILLED_SYRINGE | INTRAVENOUS | Status: AC
  Filled 2016-07-16: qty 5

## 2016-07-16 MED ORDER — LIDOCAINE HCL (CARDIAC) 20 MG/ML IV SOLN
INTRAVENOUS | Status: DC | PRN
  Administered 2016-07-16: 100 mg via INTRAVENOUS

## 2016-07-16 MED ORDER — FENTANYL CITRATE (PF) 100 MCG/2ML IJ SOLN
INTRAMUSCULAR | Status: AC
  Filled 2016-07-16: qty 2

## 2016-07-16 MED ORDER — ARTIFICIAL TEARS OP OINT
TOPICAL_OINTMENT | OPHTHALMIC | Status: DC | PRN
  Administered 2016-07-16: 1 via OPHTHALMIC

## 2016-07-16 MED ORDER — HEPARIN SOD (PORK) LOCK FLUSH 100 UNIT/ML IV SOLN
INTRAVENOUS | Status: AC
  Filled 2016-07-16: qty 5

## 2016-07-16 MED ORDER — EPINEPHRINE PF 1 MG/ML IJ SOLN
INTRAMUSCULAR | Status: AC
Start: 2016-07-16 — End: 2016-07-16
  Filled 2016-07-16: qty 1

## 2016-07-16 MED ORDER — SCOPOLAMINE 1 MG/3DAYS TD PT72
MEDICATED_PATCH | TRANSDERMAL | Status: AC
  Filled 2016-07-16: qty 2

## 2016-07-16 MED ORDER — SUGAMMADEX SODIUM 200 MG/2ML IV SOLN
INTRAVENOUS | Status: AC
  Filled 2016-07-16: qty 2

## 2016-07-16 MED ORDER — DEXTROSE 5 % IV SOLN
INTRAVENOUS | Status: DC | PRN
  Administered 2016-07-16: 08:00:00 via INTRAVENOUS

## 2016-07-16 MED ORDER — MIDAZOLAM HCL 5 MG/5ML IJ SOLN
INTRAMUSCULAR | Status: DC | PRN
  Administered 2016-07-16: 2 mg via INTRAVENOUS

## 2016-07-16 MED ORDER — METOCLOPRAMIDE HCL 5 MG/ML IJ SOLN
10.0000 mg | Freq: Once | INTRAMUSCULAR | Status: AC | PRN
  Administered 2016-07-16: 10 mg via INTRAVENOUS

## 2016-07-16 MED ORDER — LACTATED RINGERS IV SOLN: INTRAVENOUS | Status: DC

## 2016-07-16 MED ORDER — HYDROCODONE-ACETAMINOPHEN 5-325 MG PO TABS: 1.0000 | ORAL_TABLET | Freq: Four times a day (QID) | ORAL | 0 refills | Status: DC | PRN

## 2016-07-16 MED ORDER — ONDANSETRON HCL 4 MG/2ML IJ SOLN
INTRAMUSCULAR | Status: DC | PRN
  Administered 2016-07-16 (×2): 4 mg via INTRAVENOUS

## 2016-07-16 MED ORDER — ACETAMINOPHEN 325 MG PO TABS: 650.0000 mg | ORAL_TABLET | ORAL | Status: DC | PRN

## 2016-07-16 MED ORDER — SODIUM CHLORIDE 0.9 % IV SOLN: 250.0000 mL | INTRAVENOUS | Status: DC | PRN

## 2016-07-16 MED ORDER — MIDAZOLAM HCL 2 MG/2ML IJ SOLN
INTRAMUSCULAR | Status: AC
  Filled 2016-07-16: qty 2

## 2016-07-16 MED ORDER — ARTIFICIAL TEARS OP OINT
TOPICAL_OINTMENT | OPHTHALMIC | Status: AC
  Filled 2016-07-16: qty 3.5

## 2016-07-16 MED ORDER — SUGAMMADEX SODIUM 200 MG/2ML IV SOLN: INTRAVENOUS | Status: DC | PRN

## 2016-07-16 MED ORDER — BUPIVACAINE HCL (PF) 0.5 % IJ SOLN
INTRAMUSCULAR | Status: AC
  Filled 2016-07-16: qty 30

## 2016-07-16 MED ORDER — LACTATED RINGERS IV SOLN
INTRAVENOUS | Status: DC
  Administered 2016-07-16: 08:00:00 via INTRAVENOUS

## 2016-07-16 MED ORDER — FENTANYL CITRATE (PF) 100 MCG/2ML IJ SOLN: 25.0000 ug | INTRAMUSCULAR | Status: DC | PRN

## 2016-07-16 MED ORDER — ACETAMINOPHEN 650 MG RE SUPP: 650.0000 mg | RECTAL | Status: DC | PRN

## 2016-07-16 MED ORDER — MEPERIDINE HCL 25 MG/ML IJ SOLN: 6.2500 mg | INTRAMUSCULAR | Status: DC | PRN

## 2016-07-16 MED ORDER — LIDOCAINE 2% (20 MG/ML) 5 ML SYRINGE
INTRAMUSCULAR | Status: AC
  Filled 2016-07-16: qty 5

## 2016-07-16 MED ORDER — OXYCODONE HCL 5 MG PO TABS: 5.0000 mg | ORAL_TABLET | ORAL | Status: DC | PRN

## 2016-07-16 MED ORDER — ROCURONIUM BROMIDE 100 MG/10ML IV SOLN
INTRAVENOUS | Status: DC | PRN
  Administered 2016-07-16: 30 mg via INTRAVENOUS

## 2016-07-16 MED ORDER — PROPOFOL 10 MG/ML IV BOLUS
INTRAVENOUS | Status: DC | PRN
  Administered 2016-07-16: 200 mg via INTRAVENOUS

## 2016-07-16 MED ORDER — SCOPOLAMINE 1 MG/3DAYS TD PT72
MEDICATED_PATCH | TRANSDERMAL | Status: DC | PRN
  Administered 2016-07-16: 1 via TRANSDERMAL

## 2016-07-16 MED ORDER — CHLORHEXIDINE GLUCONATE CLOTH 2 % EX PADS: 6.0000 | MEDICATED_PAD | Freq: Once | CUTANEOUS | Status: DC

## 2016-07-16 MED ORDER — HEPARIN SOD (PORK) LOCK FLUSH 100 UNIT/ML IV SOLN
INTRAVENOUS | Status: DC | PRN
  Administered 2016-07-16: 500 [IU] via INTRAVENOUS

## 2016-07-16 MED ORDER — HEPARIN SODIUM (PORCINE) 5000 UNIT/ML IJ SOLN
INTRAMUSCULAR | Status: DC | PRN
  Administered 2016-07-16: 09:00:00

## 2016-07-16 MED ORDER — SUCCINYLCHOLINE CHLORIDE 20 MG/ML IJ SOLN
INTRAMUSCULAR | Status: DC | PRN
  Administered 2016-07-16: 140 mg via INTRAVENOUS

## 2016-07-16 MED ORDER — SODIUM CHLORIDE 0.9% FLUSH: 3.0000 mL | Freq: Two times a day (BID) | INTRAVENOUS | Status: DC

## 2016-07-16 MED ORDER — LACTATED RINGERS IV SOLN
INTRAVENOUS | Status: DC | PRN
  Administered 2016-07-16: 08:00:00 via INTRAVENOUS

## 2016-07-16 MED ORDER — BUPIVACAINE HCL (PF) 0.5 % IJ SOLN
INTRAMUSCULAR | Status: DC | PRN
  Administered 2016-07-16: 30 mL

## 2016-07-16 MED ORDER — SUCCINYLCHOLINE CHLORIDE 200 MG/10ML IV SOSY
PREFILLED_SYRINGE | INTRAVENOUS | Status: AC
  Filled 2016-07-16: qty 10

## 2016-07-16 MED ORDER — 0.9 % SODIUM CHLORIDE (POUR BTL) OPTIME
TOPICAL | Status: DC | PRN
  Administered 2016-07-16: 1000 mL

## 2016-07-16 MED ORDER — METOCLOPRAMIDE HCL 5 MG/ML IJ SOLN
INTRAMUSCULAR | Status: AC
  Filled 2016-07-16: qty 2

## 2016-07-16 MED ORDER — LIDOCAINE HCL (PF) 1 % IJ SOLN
INTRAMUSCULAR | Status: AC
  Filled 2016-07-16: qty 30

## 2016-07-16 MED ORDER — SUGAMMADEX SODIUM 200 MG/2ML IV SOLN
INTRAVENOUS | Status: DC | PRN
  Administered 2016-07-16: 200 mg via INTRAVENOUS

## 2016-07-16 MED ORDER — EPHEDRINE SULFATE 50 MG/ML IJ SOLN
INTRAMUSCULAR | Status: DC | PRN
  Administered 2016-07-16: 10 mg via INTRAVENOUS

## 2016-07-16 MED ORDER — SODIUM CHLORIDE 0.9% FLUSH: 3.0000 mL | INTRAVENOUS | Status: DC | PRN

## 2016-07-16 MED ORDER — FENTANYL CITRATE (PF) 100 MCG/2ML IJ SOLN
INTRAMUSCULAR | Status: DC | PRN
  Administered 2016-07-16: 100 ug via INTRAVENOUS

## 2016-07-16 MED ORDER — ONDANSETRON HCL 4 MG/2ML IJ SOLN
INTRAMUSCULAR | Status: AC
  Filled 2016-07-16: qty 4

## 2016-07-16 SURGICAL SUPPLY — 49 items
BAG DECANTER FOR FLEXI CONT (MISCELLANEOUS) ×2 IMPLANT
BLADE SURG 11 STRL SS (BLADE) ×2 IMPLANT
BLADE SURG 15 STRL LF DISP TIS (BLADE) ×1 IMPLANT
BLADE SURG 15 STRL SS (BLADE) ×1
BLADE SURG ROTATE 9660 (MISCELLANEOUS) IMPLANT
CANISTER SUCTION 2500CC (MISCELLANEOUS) IMPLANT
CHLORAPREP W/TINT 26ML (MISCELLANEOUS) ×2 IMPLANT
COVER SURGICAL LIGHT HANDLE (MISCELLANEOUS) ×2 IMPLANT
COVER TRANSDUCER ULTRASND GEL (DRAPE) IMPLANT
COVER ULTRASOUND SURGICAL (MISCELLANEOUS) ×2 IMPLANT
CRADLE DONUT ADULT HEAD (MISCELLANEOUS) ×2 IMPLANT
DERMABOND ADVANCED (GAUZE/BANDAGES/DRESSINGS) ×1
DERMABOND ADVANCED .7 DNX12 (GAUZE/BANDAGES/DRESSINGS) ×1 IMPLANT
DRAPE C-ARM 42X72 X-RAY (DRAPES) ×2 IMPLANT
DRAPE LAPAROSCOPIC ABDOMINAL (DRAPES) ×2 IMPLANT
DRAPE UTILITY XL STRL (DRAPES) ×4 IMPLANT
ELECT CAUTERY BLADE 6.4 (BLADE) ×2 IMPLANT
ELECT REM PT RETURN 9FT ADLT (ELECTROSURGICAL) ×2
ELECTRODE REM PT RTRN 9FT ADLT (ELECTROSURGICAL) ×1 IMPLANT
GAUZE SPONGE 4X4 16PLY XRAY LF (GAUZE/BANDAGES/DRESSINGS) ×2 IMPLANT
GLOVE EUDERMIC 7 POWDERFREE (GLOVE) ×2 IMPLANT
GOWN STRL REUS W/ TWL LRG LVL3 (GOWN DISPOSABLE) ×1 IMPLANT
GOWN STRL REUS W/ TWL XL LVL3 (GOWN DISPOSABLE) ×1 IMPLANT
GOWN STRL REUS W/TWL LRG LVL3 (GOWN DISPOSABLE) ×1
GOWN STRL REUS W/TWL XL LVL3 (GOWN DISPOSABLE) ×1
INTRODUCER 13FR (MISCELLANEOUS) IMPLANT
INTRODUCER COOK 11FR (CATHETERS) IMPLANT
KIT BASIN OR (CUSTOM PROCEDURE TRAY) ×2 IMPLANT
KIT PORT POWER 8FR ISP CVUE (Catheter) ×2 IMPLANT
KIT ROOM TURNOVER OR (KITS) ×2 IMPLANT
NEEDLE HYPO 25GX1X1/2 BEV (NEEDLE) ×4 IMPLANT
NS IRRIG 1000ML POUR BTL (IV SOLUTION) ×2 IMPLANT
PACK SURGICAL SETUP 50X90 (CUSTOM PROCEDURE TRAY) ×2 IMPLANT
PAD ARMBOARD 7.5X6 YLW CONV (MISCELLANEOUS) ×2 IMPLANT
PENCIL BUTTON HOLSTER BLD 10FT (ELECTRODE) ×2 IMPLANT
SET INTRODUCER 12FR PACEMAKER (SHEATH) IMPLANT
SET SHEATH INTRODUCER 10FR (MISCELLANEOUS) IMPLANT
SHEATH COOK PEEL AWAY SET 9F (SHEATH) IMPLANT
SURGILUBE 3G PEEL PACK STRL (MISCELLANEOUS) IMPLANT
SUT MNCRL AB 4-0 PS2 18 (SUTURE) ×4 IMPLANT
SUT PROLENE 2 0 CT2 30 (SUTURE) ×2 IMPLANT
SUT VIC AB 3-0 SH 18 (SUTURE) ×2 IMPLANT
SYR 5ML LUER SLIP (SYRINGE) ×2 IMPLANT
SYR CONTROL 10ML LL (SYRINGE) ×2 IMPLANT
SYRINGE 10CC LL (SYRINGE) ×4 IMPLANT
TOWEL OR 17X24 6PK STRL BLUE (TOWEL DISPOSABLE) ×2 IMPLANT
TOWEL OR 17X26 10 PK STRL BLUE (TOWEL DISPOSABLE) ×2 IMPLANT
TUBE CONNECTING 12X1/4 (SUCTIONS) IMPLANT
YANKAUER SUCT BULB TIP NO VENT (SUCTIONS) IMPLANT

## 2016-07-16 NOTE — Anesthesia Procedure Notes (Addendum)
Procedure Name: Intubation Date/Time: 07/16/2016 8:03 AM Performed by: Jacquiline Doe A Pre-anesthesia Checklist: Patient identified, Emergency Drugs available, Suction available and Patient being monitored Patient Re-evaluated:Patient Re-evaluated prior to inductionOxygen Delivery Method: Circle System Utilized and Circle system utilized Preoxygenation: Pre-oxygenation with 100% oxygen Intubation Type: IV induction and Cricoid Pressure applied Ventilation: Mask ventilation without difficulty Laryngoscope Size: Mac, 4 and Glidescope Grade View: Grade I Tube type: Oral Tube size: 7.5 mm Number of attempts: 1 Airway Equipment and Method: Video-laryngoscopy and Rigid stylet Placement Confirmation: ETT inserted through vocal cords under direct vision,  positive ETCO2 and breath sounds checked- equal and bilateral Secured at: 22 cm Tube secured with: Tape Dental Injury: Teeth and Oropharynx as per pre-operative assessment  Difficulty Due To: Difficulty was anticipated, Difficult Airway- due to large tongue, Difficult Airway- due to reduced neck mobility, Difficult Airway- due to anterior larynx and Difficult Airway- due to dentition Future Recommendations: Recommend- induction with short-acting agent, and alternative techniques readily available

## 2016-07-16 NOTE — Transfer of Care (Signed)
Immediate Anesthesia Transfer of Care Note  Patient: [name]  Procedure(s) Performed: Procedure(s): INSERTION PORT-A-CATH RIGHT INTERNAL JUGULAR WITH ULTRASOUND (Right)  Patient Location: PACU  Anesthesia Type:General  Level of Consciousness: awake, alert , oriented, sedated, patient cooperative and responds to stimulation  Airway & Oxygen Therapy: Patient Spontanous Breathing and Patient connected to face mask oxygen  Post-op Assessment: Report given to RN, Post -op Vital signs reviewed and stable, Patient moving all extremities and Patient moving all extremities X 4  Post vital signs: Reviewed and stable  Last Vitals:  Vitals:   07/16/16 0635 07/16/16 0930  BP: (!) 173/101 (!) 144/85  Pulse:    Resp:    Temp:  36.3 C    Last Pain:  Vitals:   07/16/16 0930  TempSrc:   PainSc: 0-No pain      Patients Stated Pain Goal: 5 ([phone] [phone])  Complications: No apparent anesthesia complications

## 2016-07-16 NOTE — Discharge Instructions (Signed)
    PORT-A-CATH: POST OP INSTRUCTIONS  Always review your discharge instruction sheet given to you by the facility where your surgery was performed.   1. A prescription for pain medication may be given to you upon discharge. Take your pain medication as prescribed, if needed. If narcotic pain medicine is not needed, then you make take acetaminophen (Tylenol) or ibuprofen (Advil) as needed.  2. Take your usually prescribed medications unless otherwise directed. 3. If you need a refill on your pain medication, please contact our office. All narcotic pain medicine now requires a paper prescription.  Phoned in and fax refills are no longer allowed by law.  Prescriptions will not be filled after 5 pm or on weekends.  4. You should follow a light diet for the remainder of the day after your procedure. 5. Most patients will experience some mild swelling and/or bruising in the area of the incision. It may take several days to resolve. 6. It is common to experience some constipation if taking pain medication after surgery. Increasing fluid intake and taking a stool softener (such as Colace) will usually help or prevent this problem from occurring. A mild laxative (Milk of Magnesia or Miralax) should be taken according to package directions if there are no bowel movements after 48 hours.  7. Unless discharge instructions indicate otherwise, you may remove your bandages 48 hours after surgery, and you may shower at that time. You may have steri-strips (small white skin tapes) in place directly over the incision.  These strips should be left on the skin for 7-10 days.  If your surgeon used Dermabond (skin glue) on the incision, you may shower in 24 hours.  The glue will flake off over the next 2-3 weeks.  8. If your port is left accessed at the end of surgery (needle left in port), the dressing cannot get wet and should only by changed by a healthcare professional. When the port is no longer accessed (when the  needle has been removed), follow step 7.   9. ACTIVITIES:  Limit activity involving your arms for the next 72 hours. Do no strenuous exercise or activity for 1 week. You may drive when you are no longer taking prescription pain medication, you can comfortably wear a seatbelt, and you can maneuver your car. 10.You may need to see your doctor in the office for a follow-up appointment.  Please       check with your doctor.  11.When you receive a new Port-a-Cath, you will get a product guide and        ID card.  Please keep them in case you need them.  WHEN TO CALL YOUR DOCTOR ([phone]): 1. Fever over 101.0 2. Chills 3. Continued bleeding from incision 4. Increased redness and tenderness at the site 5. Shortness of breath, difficulty breathing   The clinic staff is available to answer your questions during regular business hours. Please don't hesitate to call and ask to speak to one of the nurses or medical assistants for clinical concerns. If you have a medical emergency, go to the nearest emergency room or call 911.  A surgeon from [facility] is always on call at the hospital.     For further information, please visit [email or web address]      

## 2016-07-16 NOTE — Interval H&P Note (Signed)
History and Physical Interval Note:  07/16/2016 7:30 AM  [name]  has presented today for surgery, with the diagnosis of LEFT BREAST CANCER  The various methods of treatment have been discussed with the patient and family. After consideration of risks, benefits and other options for treatment, the patient has consented to  Procedure(s): INSERTION PORT-A-CATH WITH [name] (N/A) as a surgical intervention .  The patient's history has been reviewed, patient examined, no change in status, stable for surgery.  I have reviewed the patient's chart and labs.  Questions were answered to the patient's satisfaction.     [name]

## 2016-07-16 NOTE — Anesthesia Postprocedure Evaluation (Signed)
Anesthesia Post Note  Patient: [name]  Procedure(s) Performed: Procedure(s) (LRB): INSERTION PORT-A-CATH RIGHT INTERNAL JUGULAR WITH ULTRASOUND (Right)  Patient location during evaluation: PACU Anesthesia Type: General Level of consciousness: awake and alert and oriented Pain management: pain level controlled Vital Signs Assessment: post-procedure vital signs reviewed and stable Respiratory status: spontaneous breathing, nonlabored ventilation and respiratory function stable Cardiovascular status: blood pressure returned to baseline and stable Postop Assessment: no signs of nausea or vomiting Anesthetic complications: no       Last Vitals:  Vitals:   07/16/16 1015 07/16/16 1030  BP: 138/63 (!) 144/85  Pulse: 67 67  Resp: (!) 23 (!) 24  Temp:  36.5 C    Last Pain:  Vitals:   07/16/16 1015  TempSrc:   PainSc: 0-No pain    LLE Motor Response: Responds to commands;Purposeful movement (07/16/16 1030)   RLE Motor Response: Responds to commands;Purposeful movement (07/16/16 1030)        Caroll Cunnington A.

## 2016-07-16 NOTE — Op Note (Signed)
Patient Name:           [name]   Date of Surgery:        07/16/2016  Pre op Diagnosis:      Invasive ductal carcinoma left breast, upper outer quadrant, ER positive, stage TIc, N1  Post op Diagnosis:    Same  Procedure:                 Insertion of power port clear view 8 French tunneled venous vascular access device                                      Use of ultrasound for guidance of venipuncture                                      Use of fluoroscopy for guidance and positioning  Surgeon:                     Edsel Petrin. Dalbert Batman, M.D., FACS  Assistant:                      OR staff   Indication for Assistant: n/a  Operative Indications:     This is a 66 year old female who returns for her first postop visit following definitive surgical management of her left breast cancer. Dr. Lindi Adie and Dr. Sondra Come have evaluated her at the [place]. Dr. Fransico Him is her cardiologist. Dr. Gwyneth Revels is her PCP.      On June 21, 2016 she underwent left lumpectomy and sentinel node biopsy. Final pathology shows a 1.1 cm invasive ductal carcinoma, high-grade, negative margins, positive LVI. Metastatic cancer in 1 out of 6 lymph nodes but there was positive extracapsular extension. ER 30%. PR negative. HER-2 positive. Stage TIc, N1.        Dr. Lindi Adie has advised chemotherapy and she agrees with this She is scheduled for Port-A-Cath insertion on January 23. She is symptomatically doing well from her left breast and axillary surgery.      I have discussed indications, details, techniques, and numerous risk of Port-A-Cath insertion. She is aware the risks of bleeding, infection, air embolus, pneumothorax, failure to insert the catheter requiring bilateral attempts. Interval malfunction requiring revision, total failure of attempt requiring interventional radiology assistance. She knows that we might choose the subclavian or the internal jugular route. She knows this will be  somewhat difficult because of her large body habitus. She understands all these issues. All of her questions are answered. She agrees with this plan.   Operative Findings:       The ultrasound was used to guide the venipuncture.  I initially entered a smaller vein more medially but it would not thread properly.  I ultimately identified the right internal jugular vein and entered that easily and threaded the wire and the catheter through this without difficulty.  The catheter tip appeared to be just above the right atrium.  At the completion of the case there was no deformity of the catheter anywhere along its course and we had excellent blood return and the catheter flushed well.  Procedure in Detail:          Following the induction of general endotracheal anesthesia the patient was positioned with a roll behind her shoulders and  her arms at her sides.  The neck and chest was prepped and draped in a sterile fashion.  Surgical timeout was performed.  Intravenous antibiotics were given.  0.5% Marcaine was used as local infiltration anesthetic.     The patient had a very large body habitus.  We had tapered the breasts down to the foot of the bed.  The ultrasound was used from the beginning of the case.  I found what looked like a small right internal jugular vein and accessed that through a venipuncture needle but the wire would not thread distally.  I determined that I was in an accessory vein.  I pulled this out and found the large internal jugular vein somewhat more laterally.      Under ultrasound guidance I performed internal jugular venipuncture and threaded the guidewire into the central venous circulation under fluoroscopic guidance.  Using a marking pen and the C-arm I drew a template on the chest wall for proper  positioning of the catheter.  A small incision was made at the wire insertion site.  I then made a transverse incision in the right parasternal area.  The subcutaneous tissue was debrided  to make it easier to palpate and accessed the port.  Using a tunneling device I passed the catheter from the wire insertion site to the port pocket site.  Using the template on the chest wall I measured and cut the catheter 33 cm in length.  I then secured the port to the catheter with the locking device.  The port and catheter were flushed with heparinized saline.  The port was sutured to the deep subcutaneous tissue with 3 interrupted sutures of 2-0 Prolene.  The dilator and peel-away sheath was inserted over the guidewire into the central venous circulation without difficulty.  The wire and dilator were removed, the catheter threaded easily, and the peel-away sheath removed.  The catheter flushed easily and had excellent blood return.  The catheter was flushed with concentrated heparin.  Fluoroscopy was performed and confirmed that the catheter tip was in the superior  vena cava and there is no deformity of the catheter anywhere along its course.  The subcutaneous tissue was closed with 3-0 Vicryl sutures and the skin incisions were closed with 4-0 Monocryl and Dermabond.  Patient tolerated the procedure well was taken to PACU in stable condition.  EBL 20 mL.  Counts correct.  Complications none.  Portable chest x-ray is planned in PACU.     Edsel Petrin. Dalbert Batman, M.D., FACS General and Minimally Invasive Surgery Breast and Colorectal Surgery  07/16/2016 9:18 AM

## 2016-07-17 ENCOUNTER — Encounter (HOSPITAL_COMMUNITY): Payer: Self-pay | Admitting: General Surgery

## 2016-07-17 ENCOUNTER — Encounter: Payer: Self-pay | Admitting: Hematology and Oncology

## 2016-07-17 NOTE — Progress Notes (Signed)
Talked w/ pt regarding copay assistance.  Pt gave me consent to apply in her behalf so I applied to the [employer].  She was approved for $5,000 for 12 months from 07/17/16;  $1,650 is her standard award, $3,350 is accessible on a [employer] basis as long as funding remains available.  I emailed copy of approval letter and POE to Pearletha Furl, Waldon Merl and Perry in billing and gave a copy to HIM to scan in pt's chart.  I also informed her of the [employer] and went over what it covers.  She is approved for the $1000 grant so I will give her an expense sheet on 07/25/16.

## 2016-07-22 ENCOUNTER — Telehealth: Payer: Self-pay | Admitting: *Deleted

## 2016-07-22 NOTE — Addendum Note (Signed)
Encounter addended by: Jacqulyn Liner, RN on: 07/22/2016  9:19 AM<BR>    Actions taken: Charge Capture section accepted

## 2016-07-22 NOTE — Telephone Encounter (Signed)
FYI "I am to begin chemotherapy this week.  Today I have a dental cleaning appointment.  I also have to take there  Amoxicillin due to history of knee replacements.  Is it okay to have my teeth cleaned and am I allowed to take this antibiotic?"  Yes.  We advise teeth cleaning before chemotherapy begins.  Otherwise, she will have to wait until after completing treatment.  The antibiotic will interact with chemotherapy. No further questions.

## 2016-07-24 ENCOUNTER — Telehealth: Payer: Self-pay | Admitting: Hematology and Oncology

## 2016-07-24 NOTE — Assessment & Plan Note (Signed)
06/21/16: Left lumpectomy: IDC grade 3, 1.1 cm, 1/5 LN Positive with ECE, Margins Neg, LVI Present; Er 30%, PR 0%, Ki 67 40%, Her 2 Positive Ratio 2.71; T1CN1 (Stage 2B)  Patient has multiple comorbidities including history of LVH with congestive heart failure Treatment Plan: 1. consideration for adjuvant Taxol with Herceptin And Perjeta if her cardiologist allows [name] to treat her with anti-HER-2 therapy. (Last echocardiogram showed an ejection fraction of 55-60%) 2. Followed by adjuvant radiation 3. Followed by adjuvant antiestrogen therapy with anastrozole 1 mg daily 5 years (bone density 04/29/2016 T score -1.2) ----------------------------------------------------------------------------------- Current Treatment: Cycle 1 day 1 Taxol Herceptin and Perjeta Anti emetics reviewed Consent obtained ECHO 05/29/16: EF 55-60% Labs reviewed RTC in 1 week for tox check

## 2016-07-24 NOTE — Telephone Encounter (Signed)
faxed paperwork for PAF Co-Pay Relief program to [phone]

## 2016-07-25 ENCOUNTER — Encounter: Payer: Self-pay | Admitting: Hematology and Oncology

## 2016-07-25 ENCOUNTER — Ambulatory Visit (HOSPITAL_BASED_OUTPATIENT_CLINIC_OR_DEPARTMENT_OTHER): Payer: Medicare HMO | Admitting: Hematology and Oncology

## 2016-07-25 ENCOUNTER — Other Ambulatory Visit (HOSPITAL_BASED_OUTPATIENT_CLINIC_OR_DEPARTMENT_OTHER): Payer: Medicare HMO

## 2016-07-25 ENCOUNTER — Encounter: Payer: Self-pay | Admitting: *Deleted

## 2016-07-25 ENCOUNTER — Ambulatory Visit (HOSPITAL_BASED_OUTPATIENT_CLINIC_OR_DEPARTMENT_OTHER): Payer: Medicare HMO

## 2016-07-25 ENCOUNTER — Ambulatory Visit: Payer: Medicare HMO

## 2016-07-25 VITALS — BP 135/58 | HR 77 | Temp 98.6°F | Resp 20

## 2016-07-25 DIAGNOSIS — C773 Secondary and unspecified malignant neoplasm of axilla and upper limb lymph nodes: Secondary | ICD-10-CM

## 2016-07-25 DIAGNOSIS — C50412 Malignant neoplasm of upper-outer quadrant of left female breast: Secondary | ICD-10-CM

## 2016-07-25 DIAGNOSIS — Z5112 Encounter for antineoplastic immunotherapy: Secondary | ICD-10-CM

## 2016-07-25 DIAGNOSIS — Z5111 Encounter for antineoplastic chemotherapy: Secondary | ICD-10-CM | POA: Diagnosis not present

## 2016-07-25 DIAGNOSIS — Z170 Estrogen receptor positive status [ER+]: Principal | ICD-10-CM

## 2016-07-25 DIAGNOSIS — I509 Heart failure, unspecified: Secondary | ICD-10-CM | POA: Diagnosis not present

## 2016-07-25 LAB — COMPREHENSIVE METABOLIC PANEL
ALT: 50 U/L (ref 0–55)
AST: 66 U/L — AB (ref 5–34)
Albumin: 3.5 g/dL (ref 3.5–5.0)
Alkaline Phosphatase: 99 U/L (ref 40–150)
Anion Gap: 10 mEq/L (ref 3–11)
BUN: 17.2 mg/dL (ref 7.0–26.0)
CHLORIDE: 103 meq/L (ref 98–109)
CO2: 27 mEq/L (ref 22–29)
Calcium: 10.1 mg/dL (ref 8.4–10.4)
Creatinine: 0.8 mg/dL (ref 0.6–1.1)
EGFR: 75 mL/min/{1.73_m2} — ABNORMAL LOW (ref >90)
GLUCOSE: 140 mg/dL (ref 70–140)
POTASSIUM: 3.7 meq/L (ref 3.5–5.1)
SODIUM: 139 meq/L (ref 136–145)
Total Bilirubin: 0.49 mg/dL (ref 0.20–1.20)
Total Protein: 7.8 g/dL (ref 6.4–8.3)

## 2016-07-25 LAB — CBC WITH DIFFERENTIAL/PLATELET
BASO%: 0.1 % (ref 0.0–2.0)
BASOS ABS: 0 10*3/uL (ref 0.0–0.1)
EOS%: 0.5 % (ref 0.0–7.0)
Eosinophils Absolute: 0 10*3/uL (ref 0.0–0.5)
HCT: 39.1 % (ref 34.8–46.6)
HEMOGLOBIN: 13.2 g/dL (ref 11.6–15.9)
LYMPH%: 18.7 % (ref 14.0–49.7)
MCH: 28 pg (ref 25.1–34.0)
MCHC: 33.8 g/dL (ref 31.5–36.0)
MCV: 82.8 fL (ref 79.5–101.0)
MONO#: 0.6 10*3/uL (ref 0.1–0.9)
MONO%: 6.7 % (ref 0.0–14.0)
NEUT#: 6.1 10*3/uL (ref 1.5–6.5)
NEUT%: 74 % (ref 38.4–76.8)
Platelets: 234 10*3/uL (ref 145–400)
RBC: 4.72 10*6/uL (ref 3.70–5.45)
RDW: 14.6 % — AB (ref 11.2–14.5)
WBC: 8.2 10*3/uL (ref 3.9–10.3)
lymph#: 1.5 10*3/uL (ref 0.9–3.3)

## 2016-07-25 MED ORDER — SODIUM CHLORIDE 0.9 % IV SOLN
Freq: Once | INTRAVENOUS | Status: AC
  Administered 2016-07-25: 11:00:00 via INTRAVENOUS

## 2016-07-25 MED ORDER — HEPARIN SOD (PORK) LOCK FLUSH 100 UNIT/ML IV SOLN
500.0000 [IU] | Freq: Once | INTRAVENOUS | Status: AC | PRN
  Administered 2016-07-25: 500 [IU]
  Filled 2016-07-25: qty 5

## 2016-07-25 MED ORDER — SODIUM CHLORIDE 0.9 % IJ SOLN
10.0000 mL | Freq: Once | INTRAMUSCULAR | Status: AC
  Administered 2016-07-25: 10 mL via INTRAVENOUS
  Filled 2016-07-25: qty 10

## 2016-07-25 MED ORDER — SODIUM CHLORIDE 0.9 % IV SOLN
20.0000 mg | Freq: Once | INTRAVENOUS | Status: AC
  Administered 2016-07-25: 20 mg via INTRAVENOUS
  Filled 2016-07-25: qty 2

## 2016-07-25 MED ORDER — FAMOTIDINE IN NACL 20-0.9 MG/50ML-% IV SOLN
INTRAVENOUS | Status: AC
  Filled 2016-07-25: qty 50

## 2016-07-25 MED ORDER — ACETAMINOPHEN 325 MG PO TABS
ORAL_TABLET | ORAL | Status: AC
  Filled 2016-07-25: qty 2

## 2016-07-25 MED ORDER — SODIUM CHLORIDE 0.9 % IV SOLN
8.0000 mg/kg | Freq: Once | INTRAVENOUS | Status: AC
  Administered 2016-07-25: 1176 mg via INTRAVENOUS
  Filled 2016-07-25: qty 56

## 2016-07-25 MED ORDER — PROMETHAZINE HCL 25 MG/ML IJ SOLN
12.5000 mg | Freq: Once | INTRAMUSCULAR | Status: AC
Start: 2016-07-25 — End: 2016-07-25
  Administered 2016-07-25: 12.5 mg via INTRAVENOUS
  Filled 2016-07-25: qty 1

## 2016-07-25 MED ORDER — SODIUM CHLORIDE 0.9 % IV SOLN
840.0000 mg | Freq: Once | INTRAVENOUS | Status: AC
  Administered 2016-07-25: 840 mg via INTRAVENOUS
  Filled 2016-07-25: qty 28

## 2016-07-25 MED ORDER — FAMOTIDINE IN NACL 20-0.9 MG/50ML-% IV SOLN
20.0000 mg | Freq: Once | INTRAVENOUS | Status: AC
  Administered 2016-07-25: 20 mg via INTRAVENOUS

## 2016-07-25 MED ORDER — DIPHENHYDRAMINE HCL 50 MG/ML IJ SOLN
50.0000 mg | Freq: Once | INTRAMUSCULAR | Status: AC
Start: 2016-07-25 — End: 2016-07-25
  Administered 2016-07-25: 25 mg via INTRAVENOUS

## 2016-07-25 MED ORDER — DIPHENHYDRAMINE HCL 50 MG/ML IJ SOLN
INTRAMUSCULAR | Status: AC
Start: 2016-07-25 — End: 2016-07-25
  Filled 2016-07-25: qty 1

## 2016-07-25 MED ORDER — DIPHENHYDRAMINE HCL 25 MG PO CAPS
50.0000 mg | ORAL_CAPSULE | Freq: Once | ORAL | Status: AC
  Administered 2016-07-25: 50 mg via ORAL

## 2016-07-25 MED ORDER — ACETAMINOPHEN 325 MG PO TABS
650.0000 mg | ORAL_TABLET | Freq: Once | ORAL | Status: AC
  Administered 2016-07-25: 650 mg via ORAL

## 2016-07-25 MED ORDER — DIPHENHYDRAMINE HCL 25 MG PO CAPS
ORAL_CAPSULE | ORAL | Status: AC
  Filled 2016-07-25: qty 2

## 2016-07-25 MED ORDER — SODIUM CHLORIDE 0.9% FLUSH
10.0000 mL | INTRAVENOUS | Status: DC | PRN
  Administered 2016-07-25: 10 mL
  Filled 2016-07-25: qty 10

## 2016-07-25 MED ORDER — PACLITAXEL CHEMO INJECTION 300 MG/50ML
50.0000 mg/m2 | Freq: Once | INTRAVENOUS | Status: AC
  Administered 2016-07-25: 132 mg via INTRAVENOUS
  Filled 2016-07-25: qty 22

## 2016-07-25 NOTE — Progress Notes (Signed)
Approximately 15 minutes post perjeta infusion at 1430 during one hour observation period, pt got up, walked to bathroom and began to complain of nausea and hurting all over. MD contacted and advised to give phenergan 12.5 IV. Vital signs stable. Pt is resting on side and states that symptoms are improved at this time, 1500  At one hour into Taxol, pt temp increased to 100.4, and BP to 165/91. Taxol stopped until MD could be contacted. Dr. Lindi Adie was made aware of increase in vitals and advised to proceed with taxol treatment.

## 2016-07-25 NOTE — Patient Instructions (Addendum)
[facility] Discharge Instructions for Patients Receiving Chemotherapy  Today you received the following chemotherapy agents Taxol, Herceptin, Perjeta  To help prevent nausea and vomiting after your treatment, we encourage you to take your nausea medication as prescribed.   If you develop nausea and vomiting that is not controlled by your nausea medication, call the clinic.   BELOW ARE SYMPTOMS THAT SHOULD BE REPORTED IMMEDIATELY:  *FEVER GREATER THAN 100.5 F  *CHILLS WITH OR WITHOUT FEVER  NAUSEA AND VOMITING THAT IS NOT CONTROLLED WITH YOUR NAUSEA MEDICATION  *UNUSUAL SHORTNESS OF BREATH  *UNUSUAL BRUISING OR BLEEDING  TENDERNESS IN MOUTH AND THROAT WITH OR WITHOUT PRESENCE OF ULCERS  *URINARY PROBLEMS  *BOWEL PROBLEMS  UNUSUAL RASH Items with * indicate a potential emergency and should be followed up as soon as possible.  Feel free to call the clinic you have any questions or concerns. The clinic phone number is [phone].  Please show the [facility] at check-in to the Emergency Department and triage nurse.  Trastuzumab injection for infusion What is this medicine? TRASTUZUMAB (tras TOO zoo mab) is a monoclonal antibody. It is used to treat breast cancer and stomach cancer. This medicine may be used for other purposes; ask your health care provider or pharmacist if you have questions. COMMON BRAND NAME(S): Herceptin What should I tell my health care provider before I take this medicine? They need to know if you have any of these conditions: -heart disease -heart failure -infection (especially a virus infection such as chickenpox, cold sores, or herpes) -lung or breathing disease, like asthma -recent or ongoing radiation therapy -an unusual or allergic reaction to trastuzumab, benzyl alcohol, or other medications, foods, dyes, or preservatives -pregnant or trying to get pregnant -breast-feeding How should I use this medicine? This drug is  given as an infusion into a vein. It is administered in a hospital or clinic by a specially trained health care professional. Talk to your pediatrician regarding the use of this medicine in children. This medicine is not approved for use in children. Overdosage: If you think you have taken too much of this medicine contact a poison control center or emergency room at once. NOTE: This medicine is only for you. Do not share this medicine with others. What if I miss a dose? It is important not to miss a dose. Call your doctor or health care professional if you are unable to keep an appointment. What may interact with this medicine? -doxorubicin -warfarin This list may not describe all possible interactions. Give your health care provider a list of all the medicines, herbs, non-prescription drugs, or dietary supplements you use. Also tell them if you smoke, drink alcohol, or use illegal drugs. Some items may interact with your medicine. What should I watch for while using this medicine? Visit your doctor for checks on your progress. Report any side effects. Continue your course of treatment even though you feel ill unless your doctor tells you to stop. Call your doctor or health care professional for advice if you get a fever, chills or sore throat, or other symptoms of a cold or flu. Do not treat yourself. Try to avoid being around people who are sick. You may experience fever, chills and shaking during your first infusion. These effects are usually mild and can be treated with other medicines. Report any side effects during the infusion to your health care professional. Fever and chills usually do not happen with later infusions. Do not become pregnant  while taking this medicine or for 7 months after stopping it. Women should inform their doctor if they wish to become pregnant or think they might be pregnant. Women of child-bearing potential will need to have a negative pregnancy test before starting  this medicine. There is a potential for serious side effects to an unborn child. Talk to your health care professional or pharmacist for more information. Do not breast-feed an infant while taking this medicine or for 7 months after stopping it. Women must use effective birth control with this medicine. What side effects may I notice from receiving this medicine? Side effects that you should report to your doctor or health care professional as soon as possible: -breathing difficulties -chest pain or palpitations -cough -dizziness or fainting -fever or chills, sore throat -skin rash, itching or hives -swelling of the legs or ankles -unusually weak or tired Side effects that usually do not require medical attention (report to your doctor or health care professional if they continue or are bothersome): -loss of appetite -headache -muscle aches -nausea This list may not describe all possible side effects. Call your doctor for medical advice about side effects. You may report side effects to FDA at 1-800-FDA-1088. Where should I keep my medicine? This drug is given in a hospital or clinic and will not be stored at home. NOTE: This sheet is a summary. It may not cover all possible information. If you have questions about this medicine, talk to your doctor, pharmacist, or health care provider.  2017 Elsevier/Gold Standard (2015-07-12 17:16:44)  Pertuzumab injection What is this medicine? PERTUZUMAB (per TOOZ ue mab) is a monoclonal antibody. It is used to treat breast cancer. COMMON BRAND NAME(S): PERJETA What should I tell my health care provider before I take this medicine? They need to know if you have any of these conditions: -heart disease -heart failure -high blood pressure -history of irregular heart beat -recent or ongoing radiation therapy -an unusual or allergic reaction to pertuzumab, other medicines, foods, dyes, or preservatives -pregnant or trying to get  pregnant -breast-feeding How should I use this medicine? This medicine is for infusion into a vein. It is given by a health care professional in a hospital or clinic setting. Talk to your pediatrician regarding the use of this medicine in children. Special care may be needed. What if I miss a dose? It is important not to miss your dose. Call your doctor or health care professional if you are unable to keep an appointment. What may interact with this medicine? Interactions are not expected. Give your health care provider a list of all the medicines, herbs, non-prescription drugs, or dietary supplements you use. Also tell them if you smoke, drink alcohol, or use illegal drugs. Some items may interact with your medicine. What should I watch for while using this medicine? Your condition will be monitored carefully while you are receiving this medicine. Report any side effects. Continue your course of treatment even though you feel ill unless your doctor tells you to stop. Do not become pregnant while taking this medicine or for 7 months after stopping it. Women should inform their doctor if they wish to become pregnant or think they might be pregnant. Women of child-bearing potential will need to have a negative pregnancy test before starting this medicine. There is a potential for serious side effects to an unborn child. Talk to your health care professional or pharmacist for more information. Do not breast-feed an infant while taking this medicine or for 7  months after stopping it. Women must use effective birth control with this medicine. Call your doctor or health care professional for advice if you get a fever, chills or sore throat, or other symptoms of a cold or flu. Do not treat yourself. Try to avoid being around people who are sick. You may experience fever, chills, and headache during the infusion. Report any side effects during the infusion to your health care professional. What side effects  may I notice from receiving this medicine? Side effects that you should report to your doctor or health care professional as soon as possible: -breathing problems -chest pain or palpitations -dizziness -feeling faint or lightheaded -fever or chills -skin rash, itching or hives -sore throat -swelling of the face, lips, or tongue -swelling of the legs or ankles -unusually weak or tired Side effects that usually do not require medical attention (report to your doctor or health care professional if they continue or are bothersome): -diarrhea -hair loss -nausea, vomiting -tiredness Where should I keep my medicine? This drug is given in a hospital or clinic and will not be stored at home.  2017 Elsevier/Gold Standard (2015-07-13 12:08:50)    Paclitaxel injection What is this medicine? PACLITAXEL (PAK li TAX el) is a chemotherapy drug. It targets fast dividing cells, like cancer cells, and causes these cells to die. This medicine is used to treat ovarian cancer, breast cancer, and other cancers. This medicine may be used for other purposes; ask your health care provider or pharmacist if you have questions. COMMON BRAND NAME(S): Onxol, Taxol What should I tell my health care provider before I take this medicine? They need to know if you have any of these conditions: -blood disorders -irregular heartbeat -infection (especially a virus infection such as chickenpox, cold sores, or herpes) -liver disease -previous or ongoing radiation therapy -an unusual or allergic reaction to paclitaxel, alcohol, polyoxyethylated castor oil, other chemotherapy agents, other medicines, foods, dyes, or preservatives -pregnant or trying to get pregnant -breast-feeding How should I use this medicine? This drug is given as an infusion into a vein. It is administered in a hospital or clinic by a specially trained health care professional. Talk to your pediatrician regarding the use of this medicine in  children. Special care may be needed. Overdosage: If you think you have taken too much of this medicine contact a poison control center or emergency room at once. NOTE: This medicine is only for you. Do not share this medicine with others. What if I miss a dose? It is important not to miss your dose. Call your doctor or health care professional if you are unable to keep an appointment. What may interact with this medicine? Do not take this medicine with any of the following medications: -disulfiram -metronidazole This medicine may also interact with the following medications: -cyclosporine -diazepam -ketoconazole -medicines to increase blood counts like filgrastim, pegfilgrastim, sargramostim -other chemotherapy drugs like cisplatin, doxorubicin, epirubicin, etoposide, teniposide, vincristine -quinidine -testosterone -vaccines -verapamil Talk to your doctor or health care professional before taking any of these medicines: -acetaminophen -aspirin -ibuprofen -ketoprofen -naproxen This list may not describe all possible interactions. Give your health care provider a list of all the medicines, herbs, non-prescription drugs, or dietary supplements you use. Also tell them if you smoke, drink alcohol, or use illegal drugs. Some items may interact with your medicine. What should I watch for while using this medicine? Your condition will be monitored carefully while you are receiving this medicine. You will need important blood work  done while you are taking this medicine. This medicine can cause serious allergic reactions. To reduce your risk you will need to take other medicine(s) before treatment with this medicine. If you experience allergic reactions like skin rash, itching or hives, swelling of the face, lips, or tongue, tell your doctor or health care professional right away. In some cases, you may be given additional medicines to help with side effects. Follow all directions for their  use. This drug may make you feel generally unwell. This is not uncommon, as chemotherapy can affect healthy cells as well as cancer cells. Report any side effects. Continue your course of treatment even though you feel ill unless your doctor tells you to stop. Call your doctor or health care professional for advice if you get a fever, chills or sore throat, or other symptoms of a cold or flu. Do not treat yourself. This drug decreases your body's ability to fight infections. Try to avoid being around people who are sick. This medicine may increase your risk to bruise or bleed. Call your doctor or health care professional if you notice any unusual bleeding. Be careful brushing and flossing your teeth or using a toothpick because you may get an infection or bleed more easily. If you have any dental work done, tell your dentist you are receiving this medicine. Avoid taking products that contain aspirin, acetaminophen, ibuprofen, naproxen, or ketoprofen unless instructed by your doctor. These medicines may hide a fever. Do not become pregnant while taking this medicine. Women should inform their doctor if they wish to become pregnant or think they might be pregnant. There is a potential for serious side effects to an unborn child. Talk to your health care professional or pharmacist for more information. Do not breast-feed an infant while taking this medicine. Men are advised not to father a child while receiving this medicine. This product may contain alcohol. Ask your pharmacist or healthcare provider if this medicine contains alcohol. Be sure to tell all healthcare providers you are taking this medicine. Certain medicines, like metronidazole and disulfiram, can cause an unpleasant reaction when taken with alcohol. The reaction includes flushing, headache, nausea, vomiting, sweating, and increased thirst. The reaction can last from 30 minutes to several hours. What side effects may I notice from receiving this  medicine? Side effects that you should report to your doctor or health care professional as soon as possible: -allergic reactions like skin rash, itching or hives, swelling of the face, lips, or tongue -low blood counts - This drug may decrease the number of white blood cells, red blood cells and platelets. You may be at increased risk for infections and bleeding. -signs of infection - fever or chills, cough, sore throat, pain or difficulty passing urine -signs of decreased platelets or bleeding - bruising, pinpoint red spots on the skin, black, tarry stools, nosebleeds -signs of decreased red blood cells - unusually weak or tired, fainting spells, lightheadedness -breathing problems -chest pain -high or low blood pressure -mouth sores -nausea and vomiting -pain, swelling, redness or irritation at the injection site -pain, tingling, numbness in the hands or feet -slow or irregular heartbeat -swelling of the ankle, feet, hands Side effects that usually do not require medical attention (report to your doctor or health care professional if they continue or are bothersome): -bone pain -complete hair loss including hair on your head, underarms, pubic hair, eyebrows, and eyelashes -changes in the color of fingernails -diarrhea -loosening of the fingernails -loss of appetite -muscle or joint  pain -red flush to skin -sweating This list may not describe all possible side effects. Call your doctor for medical advice about side effects. You may report side effects to FDA at 1-800-FDA-1088. Where should I keep my medicine? This drug is given in a hospital or clinic and will not be stored at home. NOTE: This sheet is a summary. It may not cover all possible information. If you have questions about this medicine, talk to your doctor, pharmacist, or health care provider.  2017 Elsevier/Gold Standard (2015-04-11 19:58:00)

## 2016-07-25 NOTE — Progress Notes (Signed)
Patient Care Team: [name], MD as PCP - General (Family Medicine) Fanny Skates, MD as Consulting Physician (General Surgery) Nicholas Lose, MD as Consulting Physician (Hematology and Oncology) Gery Pray, MD as Consulting Physician (Radiation Oncology)  DIAGNOSIS:  Encounter Diagnosis  Name Primary?  . Malignant neoplasm of upper-outer quadrant of left breast in female, estrogen receptor positive ([place])     SUMMARY OF ONCOLOGIC HISTORY:   Malignant neoplasm of upper-outer quadrant of left female breast ([place])   05/13/2016 Initial Diagnosis    Left breast biopsy 3:00: IDC, grade 3, ER 30%, PR 0%, Ki-67 40%, HER-2 positive ratio 2.71, screening detected left breast mass 9 x 7 x 6 mm, axilla negative, T1 BN 0 stage IA clinical stage      06/21/2016 Surgery    Left lumpectomy: IDC grade 3, 1.1 cm, 1/5 LN Positive with ECE, Margins Neg, LVI Present; Er 30%, PR 0%, Ki 67 40%, Her 2 Positive Ratio 2.71; T1CN1 (Stage 2B)       CHIEF COMPLIANT: Cycle 1 day 1 Taxol Herceptin Perjeta  INTERVAL HISTORY: [name] is a 66 year old with above-mentioned history of left breast cancer treated with lumpectomy and was found to have 1 positive lymph node with extracapsular extension. There was lymphovascular invasion. The tumor was 30% ER positive but HER-2/neu positive. She is here today to initiate chemotherapy with Taxol Herceptin and Perjeta. We felt that she not be able to tolerate the full chemotherapy with [place]. She has a history of cardiac problems in the past but her most recent echocardiogram showed good ejection fraction. Because of this a cardiologist has given [name] permission to initiate anti her-2 therapy. She will need very close monitoring from cardiology standpoint.  REVIEW OF SYSTEMS:   Constitutional: Denies fevers, chills or abnormal weight loss Eyes: Denies blurriness of vision Ears, nose, mouth, throat, and face: Denies mucositis or sore throat, Bruising  around the neck from the recent port swelling has subsided. Respiratory: Denies cough, dyspnea or wheezes Cardiovascular: Denies palpitation, chest discomfort Gastrointestinal:  Denies nausea, heartburn or change in bowel habits Skin: Denies abnormal skin rashes Lymphatics: Denies new lymphadenopathy or easy bruising Neurological:Denies numbness, tingling or new weaknesses Behavioral/Psych: Mood is stable, no new changes  Extremities: No lower extremity edema Breast:  denies any pain or lumps or nodules in either breasts All other systems were reviewed with the patient and are negative.  I have reviewed the past medical history, past surgical history, social history and family history with the patient and they are unchanged from previous note.  ALLERGIES:  is allergic to no known allergies.  MEDICATIONS:  Current Outpatient Prescriptions  Medication Sig Dispense Refill  . acetaminophen (TYLENOL) 500 MG tablet Take 1,000 mg by mouth 2 (two) times daily as needed for moderate pain or headache.    . albuterol (PROVENTIL HFA;VENTOLIN HFA) 108 (90 Base) MCG/ACT inhaler Inhale 2 puffs into the lungs every 6 (six) hours as needed for wheezing or shortness of breath. 1 Inhaler 6  . aspirin EC 81 MG tablet Take 81 mg by mouth at bedtime.     . beclomethasone (QVAR) 80 MCG/ACT inhaler Inhale 1-2 puffs into the lungs 2 (two) times daily. (Patient taking differently: Inhale 1-2 puffs into the lungs 2 (two) times daily as needed (shortness of breath). ) 1 Inhaler 5  . clotrimazole-betamethasone (LOTRISONE) cream Apply 1 application topically 2 (two) times daily as needed (for rash). 30 g 0  . diclofenac (VOLTAREN) 75 MG EC  tablet Take 1 tablet (75 mg total) by mouth 2 (two) times daily. 180 tablet 1  . diltiazem (CARDIZEM CD) 120 MG 24 hr capsule Take 1 capsule (120 mg total) by mouth daily. 90 capsule 2  . escitalopram (LEXAPRO) 20 MG tablet TAKE 1 TABLET ONE TIME DAILY 90 tablet 4  . esomeprazole  (NEXIUM) 40 MG capsule Take 40 mg by mouth daily.     . fexofenadine (ALLEGRA) 180 MG tablet Take 180 mg by mouth daily.    . fluticasone (FLONASE) 50 MCG/ACT nasal spray Place 1 spray into both nostrils daily as needed for allergies. (Patient taking differently: Place 1 spray into both nostrils daily as needed (FOR SINUS ISSUE). ) 48 g 1  . furosemide (LASIX) 20 MG tablet Take 1 tablet (20 mg total) by mouth daily. 90 tablet 2  . HYDROcodone-acetaminophen (NORCO) 5-325 MG tablet Take 1-2 tablets by mouth every 6 (six) hours as needed for moderate pain or severe pain. 30 tablet 0  . Hypromellose (ARTIFICIAL TEARS OP) Apply 1 drop to eye daily as needed (dry eyes).    Marland Kitchen lidocaine-prilocaine (EMLA) cream Apply to affected area once 30 g 3  . LORazepam (ATIVAN) 0.5 MG tablet Take 1 tablet (0.5 mg total) by mouth every 6 (six) hours as needed (Nausea or vomiting). 30 tablet 0  . losartan (COZAAR) 100 MG tablet Take 1 tablet (100 mg total) by mouth daily. 90 tablet 2  . montelukast (SINGULAIR) 10 MG tablet Take 1 tablet (10 mg total) by mouth at bedtime. 90 tablet 2  . nebivolol (BYSTOLIC) 10 MG tablet Take 1 tablet (10 mg total) by mouth at bedtime. 90 tablet 2  . ondansetron (ZOFRAN) 8 MG tablet Take 1 tablet (8 mg total) by mouth 2 (two) times daily as needed (Nausea or vomiting). 30 tablet 1  . prochlorperazine (COMPAZINE) 10 MG tablet Take 1 tablet (10 mg total) by mouth every 6 (six) hours as needed (Nausea or vomiting). 30 tablet 1  . rosuvastatin (CRESTOR) 40 MG tablet Take 1 tablet (40 mg total) by mouth daily. (Patient taking differently: Take 40 mg by mouth at bedtime. ) 90 tablet 3   No current facility-administered medications for this visit.     PHYSICAL EXAMINATION: ECOG PERFORMANCE STATUS: 1 - Symptomatic but completely ambulatory  Vitals:   07/25/16 0951  BP: 138/69  Pulse: 69  Resp: (!) 22  Temp: 98.1 F (36.7 C)   Filed Weights   07/25/16 0951  Weight: (!) 325 lb 1.6 oz  (147.5 kg)    GENERAL:alert, no distress and comfortable SKIN: skin color, texture, turgor are normal, no rashes or significant lesions EYES: normal, Conjunctiva are pink and non-injected, sclera clear OROPHARYNX:no exudate, no erythema and lips, buccal mucosa, and tongue normal  NECK: supple, thyroid normal size, non-tender, without nodularity LYMPH:  no palpable lymphadenopathy in the cervical, axillary or inguinal LUNGS: clear to auscultation and percussion with normal breathing effort HEART: regular rate & rhythm and no murmurs and no lower extremity edema ABDOMEN:abdomen soft, non-tender and normal bowel sounds MUSCULOSKELETAL:no cyanosis of digits and no clubbing  NEURO: alert & oriented x 3 with fluent speech, no focal motor/sensory deficits EXTREMITIES: No lower extremity edema  LABORATORY DATA:  I have reviewed the data as listed   Chemistry      Component Value Date/Time   NA 139 07/25/2016 0853   K 3.7 07/25/2016 0853   CL 104 07/16/2016 0716   CO2 27 07/25/2016 0853   BUN  17.2 07/25/2016 0853   CREATININE 0.8 07/25/2016 0853      Component Value Date/Time   CALCIUM 10.1 07/25/2016 0853   ALKPHOS 99 07/25/2016 0853   AST 66 (H) 07/25/2016 0853   ALT 50 07/25/2016 0853   BILITOT 0.49 07/25/2016 0853       Lab Results  Component Value Date   WBC 8.2 07/25/2016   HGB 13.2 07/25/2016   HCT 39.1 07/25/2016   MCV 82.8 07/25/2016   PLT 234 07/25/2016   NEUTROABS 6.1 07/25/2016    ASSESSMENT & PLAN:  Malignant neoplasm of upper-outer quadrant of left female breast ([place]) 06/21/16: Left lumpectomy: IDC grade 3, 1.1 cm, 1/5 LN Positive with ECE, Margins Neg, LVI Present; Er 30%, PR 0%, Ki 67 40%, Her 2 Positive Ratio 2.71; T1CN1 (Stage 2B)  Patient has multiple comorbidities including history of LVH with congestive heart failure Treatment Plan: 1. consideration for adjuvant Taxol with Herceptin And Perjeta if her cardiologist allows [name] to treat her with  anti-HER-2 therapy. (Last echocardiogram showed an ejection fraction of 55-60%) 2. Followed by adjuvant radiation 3. Followed by adjuvant antiestrogen therapy with anastrozole 1 mg daily 5 years (bone density 04/29/2016 T score -1.2) ----------------------------------------------------------------------------------- Current Treatment: Cycle 1 day 1 Taxol Herceptin and Perjeta Anti emetics reviewed Consent obtained ECHO 05/29/16: EF 55-60% Labs reviewed RTC in 1 week for tox check   I spent 25 minutes talking to the patient of which more than half was spent in counseling and coordination of care.  No orders of the defined types were placed in this encounter.  The patient has a good understanding of the overall plan. she agrees with it. she will call with any problems that may develop before the next visit here.   Rulon Eisenmenger, MD 07/25/16

## 2016-07-26 ENCOUNTER — Encounter: Payer: Self-pay | Admitting: *Deleted

## 2016-07-26 ENCOUNTER — Telehealth: Payer: Self-pay | Admitting: *Deleted

## 2016-07-26 NOTE — Telephone Encounter (Signed)
-----   Message from Suzan Nailer, RN sent at 07/26/2016  9:17 AM EST ----- First time herceptin, perjeta and Taxol yesterday 07/25/16. She had some nausea after perjeta, and during taxol fever got up to 100.6. Contacted DR Lindi Adie who advised to proceed with completion of Taxol.

## 2016-07-26 NOTE — Telephone Encounter (Signed)
This RN called and spoke to patient after first chemotherapy. Documentation under chemo follow up call flowsheets.

## 2016-08-01 ENCOUNTER — Ambulatory Visit (HOSPITAL_BASED_OUTPATIENT_CLINIC_OR_DEPARTMENT_OTHER): Payer: Medicare HMO

## 2016-08-01 ENCOUNTER — Other Ambulatory Visit: Payer: Medicare HMO

## 2016-08-01 ENCOUNTER — Encounter: Payer: Self-pay | Admitting: Hematology and Oncology

## 2016-08-01 ENCOUNTER — Ambulatory Visit: Payer: Medicare HMO

## 2016-08-01 ENCOUNTER — Ambulatory Visit (HOSPITAL_BASED_OUTPATIENT_CLINIC_OR_DEPARTMENT_OTHER): Payer: Medicare HMO | Admitting: Hematology and Oncology

## 2016-08-01 ENCOUNTER — Encounter: Payer: Self-pay | Admitting: *Deleted

## 2016-08-01 VITALS — BP 134/80 | HR 66 | Temp 98.6°F | Resp 18

## 2016-08-01 DIAGNOSIS — Z5111 Encounter for antineoplastic chemotherapy: Secondary | ICD-10-CM | POA: Diagnosis not present

## 2016-08-01 DIAGNOSIS — C50412 Malignant neoplasm of upper-outer quadrant of left female breast: Secondary | ICD-10-CM

## 2016-08-01 DIAGNOSIS — Z95828 Presence of other vascular implants and grafts: Secondary | ICD-10-CM

## 2016-08-01 DIAGNOSIS — Z170 Estrogen receptor positive status [ER+]: Principal | ICD-10-CM

## 2016-08-01 DIAGNOSIS — C773 Secondary and unspecified malignant neoplasm of axilla and upper limb lymph nodes: Secondary | ICD-10-CM | POA: Diagnosis not present

## 2016-08-01 LAB — COMPREHENSIVE METABOLIC PANEL
ALT: 71 U/L — AB (ref 0–55)
ANION GAP: 8 meq/L (ref 3–11)
AST: 52 U/L — ABNORMAL HIGH (ref 5–34)
Albumin: 3.4 g/dL — ABNORMAL LOW (ref 3.5–5.0)
Alkaline Phosphatase: 97 U/L (ref 40–150)
BUN: 14.9 mg/dL (ref 7.0–26.0)
CHLORIDE: 105 meq/L (ref 98–109)
CO2: 26 meq/L (ref 22–29)
Calcium: 9.6 mg/dL (ref 8.4–10.4)
Creatinine: 0.8 mg/dL (ref 0.6–1.1)
EGFR: 83 mL/min/{1.73_m2} — ABNORMAL LOW (ref >90)
Glucose: 116 mg/dl (ref 70–140)
Potassium: 4.2 mEq/L (ref 3.5–5.1)
Sodium: 139 mEq/L (ref 136–145)
Total Bilirubin: 0.39 mg/dL (ref 0.20–1.20)
Total Protein: 7.4 g/dL (ref 6.4–8.3)

## 2016-08-01 LAB — CBC WITH DIFFERENTIAL/PLATELET
BASO%: 0.4 % (ref 0.0–2.0)
Basophils Absolute: 0 10*3/uL (ref 0.0–0.1)
EOS%: 1.2 % (ref 0.0–7.0)
Eosinophils Absolute: 0.1 10*3/uL (ref 0.0–0.5)
HCT: 40.4 % (ref 34.8–46.6)
HGB: 13.3 g/dL (ref 11.6–15.9)
LYMPH%: 18 % (ref 14.0–49.7)
MCH: 28 pg (ref 25.1–34.0)
MCHC: 32.8 g/dL (ref 31.5–36.0)
MCV: 85.4 fL (ref 79.5–101.0)
MONO#: 0.3 10*3/uL (ref 0.1–0.9)
MONO%: 3.6 % (ref 0.0–14.0)
NEUT#: 6.6 10*3/uL — ABNORMAL HIGH (ref 1.5–6.5)
NEUT%: 76.8 % (ref 38.4–76.8)
Platelets: 291 10*3/uL (ref 145–400)
RBC: 4.73 10*6/uL (ref 3.70–5.45)
RDW: 15.3 % — ABNORMAL HIGH (ref 11.2–14.5)
WBC: 8.6 10*3/uL (ref 3.9–10.3)
lymph#: 1.5 10*3/uL (ref 0.9–3.3)

## 2016-08-01 MED ORDER — FAMOTIDINE IN NACL 20-0.9 MG/50ML-% IV SOLN
20.0000 mg | Freq: Once | INTRAVENOUS | Status: AC
  Administered 2016-08-01: 20 mg via INTRAVENOUS

## 2016-08-01 MED ORDER — DIPHENHYDRAMINE HCL 50 MG/ML IJ SOLN
INTRAMUSCULAR | Status: AC
  Filled 2016-08-01: qty 1

## 2016-08-01 MED ORDER — SODIUM CHLORIDE 0.9% FLUSH
10.0000 mL | INTRAVENOUS | Status: DC | PRN
  Administered 2016-08-01: 10 mL via INTRAVENOUS
  Filled 2016-08-01: qty 10

## 2016-08-01 MED ORDER — SODIUM CHLORIDE 0.9 % IV SOLN
Freq: Once | INTRAVENOUS | Status: AC
  Administered 2016-08-01: 11:00:00 via INTRAVENOUS

## 2016-08-01 MED ORDER — PACLITAXEL CHEMO INJECTION 300 MG/50ML
50.0000 mg/m2 | Freq: Once | INTRAVENOUS | Status: AC
  Administered 2016-08-01: 132 mg via INTRAVENOUS
  Filled 2016-08-01: qty 22

## 2016-08-01 MED ORDER — SODIUM CHLORIDE 0.9% FLUSH
10.0000 mL | INTRAVENOUS | Status: DC | PRN
Start: 2016-08-01 — End: 2016-08-01
  Administered 2016-08-01: 10 mL
  Filled 2016-08-01: qty 10

## 2016-08-01 MED ORDER — HEPARIN SOD (PORK) LOCK FLUSH 100 UNIT/ML IV SOLN
500.0000 [IU] | Freq: Once | INTRAVENOUS | Status: AC | PRN
  Administered 2016-08-01: 500 [IU]
  Filled 2016-08-01: qty 5

## 2016-08-01 MED ORDER — SODIUM CHLORIDE 0.9 % IV SOLN
20.0000 mg | Freq: Once | INTRAVENOUS | Status: AC
  Administered 2016-08-01: 20 mg via INTRAVENOUS
  Filled 2016-08-01: qty 2

## 2016-08-01 MED ORDER — DIPHENHYDRAMINE HCL 50 MG/ML IJ SOLN
50.0000 mg | Freq: Once | INTRAMUSCULAR | Status: AC
  Administered 2016-08-01: 50 mg via INTRAVENOUS

## 2016-08-01 MED ORDER — FAMOTIDINE IN NACL 20-0.9 MG/50ML-% IV SOLN
INTRAVENOUS | Status: AC
  Filled 2016-08-01: qty 50

## 2016-08-01 NOTE — Assessment & Plan Note (Signed)
06/21/16: Left lumpectomy: IDC grade 3, 1.1 cm, 1/5 LN Positive with ECE, Margins Neg, LVI Present; Er 30%, PR 0%, Ki 67 40%, Her 2 Positive Ratio 2.71; T1CN1 (Stage 2B)  Patient has multiple comorbidities including history of LVH with congestive heart failure Treatment Plan: 1. consideration for adjuvant Taxol with Herceptin And Perjetaif her cardiologist allows [name] to treat her with anti-HER-2 therapy. (Last echocardiogram showed an ejection fraction of 55-60%) 2. Followed by adjuvant radiation 3. Followed by adjuvant antiestrogen therapy with anastrozole 1 mg daily 5 years (bone density 04/29/2016 T score -1.2) ----------------------------------------------------------------------------------------------------------------------------------------------------- Current Treatment: Cycle 2 weekly Taxol; Herceptin and Perjeta Q 3 weeks Chemotherapy toxicities:  Return to clinic in 2 weeks for cycle 4 Taxol and cycle 2 Herceptin and Perjeta

## 2016-08-01 NOTE — Progress Notes (Signed)
Patient Care Team: [name], MD as PCP - General (Family Medicine) Fanny Skates, MD as Consulting Physician (General Surgery) Nicholas Lose, MD as Consulting Physician (Hematology and Oncology) Gery Pray, MD as Consulting Physician (Radiation Oncology)  DIAGNOSIS:  Encounter Diagnosis  Name Primary?  . Malignant neoplasm of upper-outer quadrant of left breast in female, estrogen receptor positive ([place])     SUMMARY OF ONCOLOGIC HISTORY:   Malignant neoplasm of upper-outer quadrant of left female breast ([place])   05/13/2016 Initial Diagnosis    Left breast biopsy 3:00: IDC, grade 3, ER 30%, PR 0%, Ki-67 40%, HER-2 positive ratio 2.71, screening detected left breast mass 9 x 7 x 6 mm, axilla negative, T1 BN 0 stage IA clinical stage      06/21/2016 Surgery    Left lumpectomy: IDC grade 3, 1.1 cm, 1/5 LN Positive with ECE, Margins Neg, LVI Present; Er 30%, PR 0%, Ki 67 40%, Her 2 Positive Ratio 2.71; T1CN1 (Stage 2B)      07/25/2016 -  Chemotherapy    Taxol weekly 12; Herceptin and Perjeta every 3 weeks        CHIEF COMPLIANT: Cycle 2 Taxol  INTERVAL HISTORY: [name] is a 66 year old with above-mentioned history left breast cancer to lumpectomy and is now on adjuvant chemotherapy. Today is cycle 2 of Taxol. She received Herceptin and Perjeta last week along with Taxol. She reports that she tolerated the treatment fairly well. She had nausea and vomiting during chemotherapy. Did not have any reaction to Herceptin and Perjeta.  REVIEW OF SYSTEMS:   Constitutional: Denies fevers, chills or abnormal weight loss Eyes: Denies blurriness of vision Ears, nose, mouth, throat, and face: Denies mucositis or sore throat Respiratory: Denies cough, dyspnea or wheezes Cardiovascular: Denies palpitation, chest discomfort Gastrointestinal:  Denies nausea, heartburn or change in bowel habits Skin: Denies abnormal skin rashes Lymphatics: Denies new lymphadenopathy or easy  bruising Neurological:Denies numbness, tingling or new weaknesses Behavioral/Psych: Mood is stable, no new changes  Extremities: No lower extremity edema Breast:  denies any pain or lumps or nodules in either breasts All other systems were reviewed with the patient and she is afebrile he is well as a [place] are negative.  I have reviewed the past medical history, past surgical history, social history and family history with the patient and they are unchanged from previous note.  ALLERGIES:  is allergic to no known allergies.  MEDICATIONS:  Current Outpatient Prescriptions  Medication Sig Dispense Refill  . acetaminophen (TYLENOL) 500 MG tablet Take 1,000 mg by mouth 2 (two) times daily as needed for moderate pain or headache.    . albuterol (PROVENTIL HFA;VENTOLIN HFA) 108 (90 Base) MCG/ACT inhaler Inhale 2 puffs into the lungs every 6 (six) hours as needed for wheezing or shortness of breath. 1 Inhaler 6  . aspirin EC 81 MG tablet Take 81 mg by mouth at bedtime.     . beclomethasone (QVAR) 80 MCG/ACT inhaler Inhale 1-2 puffs into the lungs 2 (two) times daily. (Patient taking differently: Inhale 1-2 puffs into the lungs 2 (two) times daily as needed (shortness of breath). ) 1 Inhaler 5  . clotrimazole-betamethasone (LOTRISONE) cream Apply 1 application topically 2 (two) times daily as needed (for rash). 30 g 0  . diclofenac (VOLTAREN) 75 MG EC tablet Take 1 tablet (75 mg total) by mouth 2 (two) times daily. 180 tablet 1  . diltiazem (CARDIZEM CD) 120 MG 24 hr capsule Take 1 capsule (120 mg total) by  mouth daily. 90 capsule 2  . escitalopram (LEXAPRO) 20 MG tablet TAKE 1 TABLET ONE TIME DAILY 90 tablet 4  . esomeprazole (NEXIUM) 40 MG capsule Take 40 mg by mouth daily.     . fluticasone (FLONASE) 50 MCG/ACT nasal spray Place 1 spray into both nostrils daily as needed for allergies. (Patient taking differently: Place 1 spray into both nostrils daily as needed (FOR SINUS ISSUE). ) 48 g 1  .  furosemide (LASIX) 20 MG tablet Take 1 tablet (20 mg total) by mouth daily. 90 tablet 2  . HYDROcodone-acetaminophen (NORCO) 5-325 MG tablet Take 1-2 tablets by mouth every 6 (six) hours as needed for moderate pain or severe pain. 30 tablet 0  . Hypromellose (ARTIFICIAL TEARS OP) Apply 1 drop to eye daily as needed (dry eyes).    Marland Kitchen lidocaine-prilocaine (EMLA) cream Apply to affected area once 30 g 3  . LORazepam (ATIVAN) 0.5 MG tablet Take 1 tablet (0.5 mg total) by mouth every 6 (six) hours as needed (Nausea or vomiting). 30 tablet 0  . losartan (COZAAR) 100 MG tablet Take 1 tablet (100 mg total) by mouth daily. 90 tablet 2  . montelukast (SINGULAIR) 10 MG tablet Take 1 tablet (10 mg total) by mouth at bedtime. 90 tablet 2  . nebivolol (BYSTOLIC) 10 MG tablet Take 1 tablet (10 mg total) by mouth at bedtime. 90 tablet 2  . ondansetron (ZOFRAN) 8 MG tablet Take 1 tablet (8 mg total) by mouth 2 (two) times daily as needed (Nausea or vomiting). 30 tablet 1  . prochlorperazine (COMPAZINE) 10 MG tablet Take 1 tablet (10 mg total) by mouth every 6 (six) hours as needed (Nausea or vomiting). 30 tablet 1  . rosuvastatin (CRESTOR) 40 MG tablet Take 1 tablet (40 mg total) by mouth daily. (Patient taking differently: Take 40 mg by mouth at bedtime. ) 90 tablet 3   No current facility-administered medications for this visit.     PHYSICAL EXAMINATION: ECOG PERFORMANCE STATUS: 1 - Symptomatic but completely ambulatory  Vitals:   08/01/16 1013  BP: 134/88  Pulse: (!) 57  Resp: 19  Temp: 97.9 F (36.6 C)   Filed Weights   08/01/16 1013  Weight: (!) 321 lb 11.2 oz (145.9 kg)    GENERAL:alert, no distress and comfortable SKIN: skin color, texture, turgor are normal, no rashes or significant lesions EYES: normal, Conjunctiva are pink and non-injected, sclera clear OROPHARYNX:no exudate, no erythema and lips, buccal mucosa, and tongue normal  NECK: supple, thyroid normal size, non-tender, without  nodularity LYMPH:  no palpable lymphadenopathy in the cervical, axillary or inguinal LUNGS: clear to auscultation and percussion with normal breathing effort HEART: regular rate & rhythm and no murmurs and no lower extremity edema ABDOMEN:abdomen soft, non-tender and normal bowel sounds MUSCULOSKELETAL:no cyanosis of digits and no clubbing  NEURO: alert & oriented x 3 with fluent speech, no focal motor/sensory deficits EXTREMITIES: No lower extremity edema   LABORATORY DATA:  I have reviewed the data as listed   Chemistry      Component Value Date/Time   NA 139 08/01/2016 0935   K 4.2 08/01/2016 0935   CL 104 07/16/2016 0716   CO2 26 08/01/2016 0935   BUN 14.9 08/01/2016 0935   CREATININE 0.8 08/01/2016 0935      Component Value Date/Time   CALCIUM 9.6 08/01/2016 0935   ALKPHOS 97 08/01/2016 0935   AST 52 (H) 08/01/2016 0935   ALT 71 (H) 08/01/2016 0935   BILITOT 0.39  08/01/2016 0935       Lab Results  Component Value Date   WBC 8.6 08/01/2016   HGB 13.3 08/01/2016   HCT 40.4 08/01/2016   MCV 85.4 08/01/2016   PLT 291 08/01/2016   NEUTROABS 6.6 (H) 08/01/2016    ASSESSMENT & PLAN:  Malignant neoplasm of upper-outer quadrant of left female breast ([place]) 06/21/16: Left lumpectomy: IDC grade 3, 1.1 cm, 1/5 LN Positive with ECE, Margins Neg, LVI Present; Er 30%, PR 0%, Ki 67 40%, Her 2 Positive Ratio 2.71; T1CN1 (Stage 2B)  Patient has multiple comorbidities including history of LVH with congestive heart failure Treatment Plan: 1. consideration for adjuvant Taxol with Herceptin And Perjetaif her cardiologist allows [name] to treat her with anti-HER-2 therapy. (Last echocardiogram showed an ejection fraction of 55-60%) 2. Followed by adjuvant radiation 3. Followed by adjuvant antiestrogen therapy with anastrozole 1 mg daily 5 years (bone density 04/29/2016 T score  -1.2) ----------------------------------------------------------------------------------------------------------------------------------------------------- Current Treatment: Cycle 2 weekly Taxol; Herceptin and Perjeta Q 3 weeks Chemotherapy toxicities: 1. Nausea vomiting during chemotherapy  Blood counts are reviewed and are adequate for treatment.  Denies any diarrhea. Denies neuropathy   Return to clinic in 2 weeks for cycle 4 Taxol and cycle 2 Herceptin and Perjeta  I spent 25 minutes talking to the patient of which more than half was spent in counseling and coordination of care.  No orders of the defined types were placed in this encounter.  The patient has a good understanding of the overall plan. she agrees with it. she will call with any problems that may develop before the next visit here.   Rulon Eisenmenger, MD 08/01/16

## 2016-08-01 NOTE — Patient Instructions (Signed)
[facility] Discharge Instructions for Patients Receiving Chemotherapy  Today you received the following chemotherapy agents: Taxol.  To help prevent nausea and vomiting after your treatment, we encourage you to take your nausea medication as prescribed.   If you develop nausea and vomiting that is not controlled by your nausea medication, call the clinic.   BELOW ARE SYMPTOMS THAT SHOULD BE REPORTED IMMEDIATELY:  *FEVER GREATER THAN 100.5 F  *CHILLS WITH OR WITHOUT FEVER  NAUSEA AND VOMITING THAT IS NOT CONTROLLED WITH YOUR NAUSEA MEDICATION  *UNUSUAL SHORTNESS OF BREATH  *UNUSUAL BRUISING OR BLEEDING  TENDERNESS IN MOUTH AND THROAT WITH OR WITHOUT PRESENCE OF ULCERS  *URINARY PROBLEMS  *BOWEL PROBLEMS  UNUSUAL RASH Items with * indicate a potential emergency and should be followed up as soon as possible.  Feel free to call the clinic you have any questions or concerns. The clinic phone number is [phone].  Please show the [facility] at check-in to the Emergency Department and triage nurse.  Trastuzumab injection for infusion What is this medicine? TRASTUZUMAB (tras TOO zoo mab) is a monoclonal antibody. It is used to treat breast cancer and stomach cancer. This medicine may be used for other purposes; ask your health care provider or pharmacist if you have questions. COMMON BRAND NAME(S): Herceptin What should I tell my health care provider before I take this medicine? They need to know if you have any of these conditions: -heart disease -heart failure -infection (especially a virus infection such as chickenpox, cold sores, or herpes) -lung or breathing disease, like asthma -recent or ongoing radiation therapy -an unusual or allergic reaction to trastuzumab, benzyl alcohol, or other medications, foods, dyes, or preservatives -pregnant or trying to get pregnant -breast-feeding How should I use this medicine? This drug is given as an  infusion into a vein. It is administered in a hospital or clinic by a specially trained health care professional. Talk to your pediatrician regarding the use of this medicine in children. This medicine is not approved for use in children. Overdosage: If you think you have taken too much of this medicine contact a poison control center or emergency room at once. NOTE: This medicine is only for you. Do not share this medicine with others. What if I miss a dose? It is important not to miss a dose. Call your doctor or health care professional if you are unable to keep an appointment. What may interact with this medicine? -doxorubicin -warfarin This list may not describe all possible interactions. Give your health care provider a list of all the medicines, herbs, non-prescription drugs, or dietary supplements you use. Also tell them if you smoke, drink alcohol, or use illegal drugs. Some items may interact with your medicine. What should I watch for while using this medicine? Visit your doctor for checks on your progress. Report any side effects. Continue your course of treatment even though you feel ill unless your doctor tells you to stop. Call your doctor or health care professional for advice if you get a fever, chills or sore throat, or other symptoms of a cold or flu. Do not treat yourself. Try to avoid being around people who are sick. You may experience fever, chills and shaking during your first infusion. These effects are usually mild and can be treated with other medicines. Report any side effects during the infusion to your health care professional. Fever and chills usually do not happen with later infusions. Do not become pregnant while taking  this medicine or for 7 months after stopping it. Women should inform their doctor if they wish to become pregnant or think they might be pregnant. Women of child-bearing potential will need to have a negative pregnancy test before starting this medicine.  There is a potential for serious side effects to an unborn child. Talk to your health care professional or pharmacist for more information. Do not breast-feed an infant while taking this medicine or for 7 months after stopping it. Women must use effective birth control with this medicine. What side effects may I notice from receiving this medicine? Side effects that you should report to your doctor or health care professional as soon as possible: -breathing difficulties -chest pain or palpitations -cough -dizziness or fainting -fever or chills, sore throat -skin rash, itching or hives -swelling of the legs or ankles -unusually weak or tired Side effects that usually do not require medical attention (report to your doctor or health care professional if they continue or are bothersome): -loss of appetite -headache -muscle aches -nausea This list may not describe all possible side effects. Call your doctor for medical advice about side effects. You may report side effects to FDA at 1-800-FDA-1088. Where should I keep my medicine? This drug is given in a hospital or clinic and will not be stored at home. NOTE: This sheet is a summary. It may not cover all possible information. If you have questions about this medicine, talk to your doctor, pharmacist, or health care provider.  2017 [employer] Standard (2015-07-12 17:16:44)  Pertuzumab injection What is this medicine? PERTUZUMAB (per TOOZ ue mab) is a monoclonal antibody. It is used to treat breast cancer. COMMON BRAND NAME(S): PERJETA What should I tell my health care provider before I take this medicine? They need to know if you have any of these conditions: -heart disease -heart failure -high blood pressure -history of irregular heart beat -recent or ongoing radiation therapy -an unusual or allergic reaction to pertuzumab, other medicines, foods, dyes, or preservatives -pregnant or trying to get pregnant -breast-feeding How  should I use this medicine? This medicine is for infusion into a vein. It is given by a health care professional in a hospital or clinic setting. Talk to your pediatrician regarding the use of this medicine in children. Special care may be needed. What if I miss a dose? It is important not to miss your dose. Call your doctor or health care professional if you are unable to keep an appointment. What may interact with this medicine? Interactions are not expected. Give your health care provider a list of all the medicines, herbs, non-prescription drugs, or dietary supplements you use. Also tell them if you smoke, drink alcohol, or use illegal drugs. Some items may interact with your medicine. What should I watch for while using this medicine? Your condition will be monitored carefully while you are receiving this medicine. Report any side effects. Continue your course of treatment even though you feel ill unless your doctor tells you to stop. Do not become pregnant while taking this medicine or for 7 months after stopping it. Women should inform their doctor if they wish to become pregnant or think they might be pregnant. Women of child-bearing potential will need to have a negative pregnancy test before starting this medicine. There is a potential for serious side effects to an unborn child. Talk to your health care professional or pharmacist for more information. Do not breast-feed an infant while taking this medicine or for 7 months after  stopping it. Women must use effective birth control with this medicine. Call your doctor or health care professional for advice if you get a fever, chills or sore throat, or other symptoms of a cold or flu. Do not treat yourself. Try to avoid being around people who are sick. You may experience fever, chills, and headache during the infusion. Report any side effects during the infusion to your health care professional. What side effects may I notice from receiving  this medicine? Side effects that you should report to your doctor or health care professional as soon as possible: -breathing problems -chest pain or palpitations -dizziness -feeling faint or lightheaded -fever or chills -skin rash, itching or hives -sore throat -swelling of the face, lips, or tongue -swelling of the legs or ankles -unusually weak or tired Side effects that usually do not require medical attention (report to your doctor or health care professional if they continue or are bothersome): -diarrhea -hair loss -nausea, vomiting -tiredness Where should I keep my medicine? This drug is given in a hospital or clinic and will not be stored at home.  2017 [employer]vier/Gold Standard (2015-07-13 12:08:50)    Paclitaxel injection What is this medicine? PACLITAXEL (PAK li TAX el) is a chemotherapy drug. It targets fast dividing cells, like cancer cells, and causes these cells to die. This medicine is used to treat ovarian cancer, breast cancer, and other cancers. This medicine may be used for other purposes; ask your health care provider or pharmacist if you have questions. COMMON BRAND NAME(S): Onxol, Taxol What should I tell my health care provider before I take this medicine? They need to know if you have any of these conditions: -blood disorders -irregular heartbeat -infection (especially a virus infection such as chickenpox, cold sores, or herpes) -liver disease -previous or ongoing radiation therapy -an unusual or allergic reaction to paclitaxel, alcohol, polyoxyethylated castor oil, other chemotherapy agents, other medicines, foods, dyes, or preservatives -pregnant or trying to get pregnant -breast-feeding How should I use this medicine? This drug is given as an infusion into a vein. It is administered in a hospital or clinic by a specially trained health care professional. Talk to your pediatrician regarding the use of this medicine in children. Special care may be  needed. Overdosage: If you think you have taken too much of this medicine contact a poison control center or emergency room at once. NOTE: This medicine is only for you. Do not share this medicine with others. What if I miss a dose? It is important not to miss your dose. Call your doctor or health care professional if you are unable to keep an appointment. What may interact with this medicine? Do not take this medicine with any of the following medications: -disulfiram -metronidazole This medicine may also interact with the following medications: -cyclosporine -diazepam -ketoconazole -medicines to increase blood counts like filgrastim, pegfilgrastim, sargramostim -other chemotherapy drugs like cisplatin, doxorubicin, epirubicin, etoposide, teniposide, vincristine -quinidine -testosterone -vaccines -verapamil Talk to your doctor or health care professional before taking any of these medicines: -acetaminophen -aspirin -ibuprofen -ketoprofen -naproxen This list may not describe all possible interactions. Give your health care provider a list of all the medicines, herbs, non-prescription drugs, or dietary supplements you use. Also tell them if you smoke, drink alcohol, or use illegal drugs. Some items may interact with your medicine. What should I watch for while using this medicine? Your condition will be monitored carefully while you are receiving this medicine. You will need important blood work done while  you are taking this medicine. This medicine can cause serious allergic reactions. To reduce your risk you will need to take other medicine(s) before treatment with this medicine. If you experience allergic reactions like skin rash, itching or hives, swelling of the face, lips, or tongue, tell your doctor or health care professional right away. In some cases, you may be given additional medicines to help with side effects. Follow all directions for their use. This drug may make you feel  generally unwell. This is not uncommon, as chemotherapy can affect healthy cells as well as cancer cells. Report any side effects. Continue your course of treatment even though you feel ill unless your doctor tells you to stop. Call your doctor or health care professional for advice if you get a fever, chills or sore throat, or other symptoms of a cold or flu. Do not treat yourself. This drug decreases your body's ability to fight infections. Try to avoid being around people who are sick. This medicine may increase your risk to bruise or bleed. Call your doctor or health care professional if you notice any unusual bleeding. Be careful brushing and flossing your teeth or using a toothpick because you may get an infection or bleed more easily. If you have any dental work done, tell your dentist you are receiving this medicine. Avoid taking products that contain aspirin, acetaminophen, ibuprofen, naproxen, or ketoprofen unless instructed by your doctor. These medicines may hide a fever. Do not become pregnant while taking this medicine. Women should inform their doctor if they wish to become pregnant or think they might be pregnant. There is a potential for serious side effects to an unborn child. Talk to your health care professional or pharmacist for more information. Do not breast-feed an infant while taking this medicine. Men are advised not to father a child while receiving this medicine. This product may contain alcohol. Ask your pharmacist or healthcare provider if this medicine contains alcohol. Be sure to tell all healthcare providers you are taking this medicine. Certain medicines, like metronidazole and disulfiram, can cause an unpleasant reaction when taken with alcohol. The reaction includes flushing, headache, nausea, vomiting, sweating, and increased thirst. The reaction can last from 30 minutes to several hours. What side effects may I notice from receiving this medicine? Side effects that you  should report to your doctor or health care professional as soon as possible: -allergic reactions like skin rash, itching or hives, swelling of the face, lips, or tongue -low blood counts - This drug may decrease the number of white blood cells, red blood cells and platelets. You may be at increased risk for infections and bleeding. -signs of infection - fever or chills, cough, sore throat, pain or difficulty passing urine -signs of decreased platelets or bleeding - bruising, pinpoint red spots on the skin, black, tarry stools, nosebleeds -signs of decreased red blood cells - unusually weak or tired, fainting spells, lightheadedness -breathing problems -chest pain -high or low blood pressure -mouth sores -nausea and vomiting -pain, swelling, redness or irritation at the injection site -pain, tingling, numbness in the hands or feet -slow or irregular heartbeat -swelling of the ankle, feet, hands Side effects that usually do not require medical attention (report to your doctor or health care professional if they continue or are bothersome): -bone pain -complete hair loss including hair on your head, underarms, pubic hair, eyebrows, and eyelashes -changes in the color of fingernails -diarrhea -loosening of the fingernails -loss of appetite -muscle or joint pain -red  flush to skin -sweating This list may not describe all possible side effects. Call your doctor for medical advice about side effects. You may report side effects to FDA at 1-800-FDA-1088. Where should I keep my medicine? This drug is given in a hospital or clinic and will not be stored at home. NOTE: This sheet is a summary. It may not cover all possible information. If you have questions about this medicine, talk to your doctor, pharmacist, or health care provider.  2017 [employer] Standard (2015-04-11 19:58:00)

## 2016-08-08 ENCOUNTER — Encounter: Payer: Self-pay | Admitting: Oncology

## 2016-08-08 ENCOUNTER — Other Ambulatory Visit: Payer: Medicare HMO

## 2016-08-08 ENCOUNTER — Ambulatory Visit: Payer: Medicare HMO

## 2016-08-08 ENCOUNTER — Other Ambulatory Visit: Payer: Self-pay | Admitting: Oncology

## 2016-08-08 ENCOUNTER — Ambulatory Visit (HOSPITAL_BASED_OUTPATIENT_CLINIC_OR_DEPARTMENT_OTHER): Payer: Medicare HMO | Admitting: Oncology

## 2016-08-08 ENCOUNTER — Ambulatory Visit (HOSPITAL_BASED_OUTPATIENT_CLINIC_OR_DEPARTMENT_OTHER): Payer: Medicare HMO

## 2016-08-08 VITALS — BP 132/95 | HR 74 | Temp 98.4°F | Resp 20

## 2016-08-08 DIAGNOSIS — Z170 Estrogen receptor positive status [ER+]: Principal | ICD-10-CM

## 2016-08-08 DIAGNOSIS — C773 Secondary and unspecified malignant neoplasm of axilla and upper limb lymph nodes: Secondary | ICD-10-CM

## 2016-08-08 DIAGNOSIS — Z5111 Encounter for antineoplastic chemotherapy: Secondary | ICD-10-CM | POA: Diagnosis not present

## 2016-08-08 DIAGNOSIS — R21 Rash and other nonspecific skin eruption: Secondary | ICD-10-CM | POA: Diagnosis not present

## 2016-08-08 DIAGNOSIS — C50412 Malignant neoplasm of upper-outer quadrant of left female breast: Secondary | ICD-10-CM

## 2016-08-08 DIAGNOSIS — Z95828 Presence of other vascular implants and grafts: Secondary | ICD-10-CM

## 2016-08-08 LAB — CBC WITH DIFFERENTIAL/PLATELET
BASO%: 0.2 % (ref 0.0–2.0)
Basophils Absolute: 0 10*3/uL (ref 0.0–0.1)
EOS%: 1.1 % (ref 0.0–7.0)
Eosinophils Absolute: 0.1 10*3/uL (ref 0.0–0.5)
HEMATOCRIT: 40.4 % (ref 34.8–46.6)
HGB: 13.1 g/dL (ref 11.6–15.9)
LYMPH#: 1.2 10*3/uL (ref 0.9–3.3)
LYMPH%: 21 % (ref 14.0–49.7)
MCH: 28.4 pg (ref 25.1–34.0)
MCHC: 32.4 g/dL (ref 31.5–36.0)
MCV: 87.4 fL (ref 79.5–101.0)
MONO#: 0.3 10*3/uL (ref 0.1–0.9)
MONO%: 5.5 % (ref 0.0–14.0)
NEUT#: 4.1 10*3/uL (ref 1.5–6.5)
NEUT%: 72.2 % (ref 38.4–76.8)
Platelets: 268 10*3/uL (ref 145–400)
RBC: 4.62 10*6/uL (ref 3.70–5.45)
RDW: 15 % — ABNORMAL HIGH (ref 11.2–14.5)
WBC: 5.7 10*3/uL (ref 3.9–10.3)

## 2016-08-08 LAB — COMPREHENSIVE METABOLIC PANEL
ALT: 65 U/L — AB (ref 0–55)
AST: 56 U/L — ABNORMAL HIGH (ref 5–34)
Albumin: 3.3 g/dL — ABNORMAL LOW (ref 3.5–5.0)
Alkaline Phosphatase: 88 U/L (ref 40–150)
Anion Gap: 10 mEq/L (ref 3–11)
BUN: 16.2 mg/dL (ref 7.0–26.0)
CHLORIDE: 104 meq/L (ref 98–109)
CO2: 24 mEq/L (ref 22–29)
CREATININE: 0.7 mg/dL (ref 0.6–1.1)
Calcium: 9.8 mg/dL (ref 8.4–10.4)
EGFR: 86 mL/min/{1.73_m2} — ABNORMAL LOW (ref >90)
GLUCOSE: 125 mg/dL (ref 70–140)
Potassium: 4.1 mEq/L (ref 3.5–5.1)
Sodium: 139 mEq/L (ref 136–145)
Total Bilirubin: 0.45 mg/dL (ref 0.20–1.20)
Total Protein: 6.9 g/dL (ref 6.4–8.3)

## 2016-08-08 MED ORDER — SODIUM CHLORIDE 0.9 % IV SOLN
20.0000 mg | Freq: Once | INTRAVENOUS | Status: AC
  Administered 2016-08-08: 20 mg via INTRAVENOUS
  Filled 2016-08-08: qty 2

## 2016-08-08 MED ORDER — DIPHENHYDRAMINE HCL 50 MG/ML IJ SOLN
INTRAMUSCULAR | Status: AC
  Filled 2016-08-08: qty 1

## 2016-08-08 MED ORDER — SODIUM CHLORIDE 0.9 % IV SOLN
Freq: Once | INTRAVENOUS | Status: AC
  Administered 2016-08-08: 10:00:00 via INTRAVENOUS

## 2016-08-08 MED ORDER — TRIAMCINOLONE ACETONIDE 0.025 % EX OINT: 1.0000 "application " | TOPICAL_OINTMENT | Freq: Two times a day (BID) | CUTANEOUS | 0 refills | Status: DC

## 2016-08-08 MED ORDER — FAMOTIDINE IN NACL 20-0.9 MG/50ML-% IV SOLN
INTRAVENOUS | Status: AC
  Filled 2016-08-08: qty 50

## 2016-08-08 MED ORDER — SODIUM CHLORIDE 0.9% FLUSH
10.0000 mL | INTRAVENOUS | Status: DC | PRN
  Administered 2016-08-08: 10 mL
  Filled 2016-08-08: qty 10

## 2016-08-08 MED ORDER — SODIUM CHLORIDE 0.9% FLUSH
10.0000 mL | INTRAVENOUS | Status: DC | PRN
  Administered 2016-08-08: 10 mL via INTRAVENOUS
  Filled 2016-08-08: qty 10

## 2016-08-08 MED ORDER — FAMOTIDINE IN NACL 20-0.9 MG/50ML-% IV SOLN
20.0000 mg | Freq: Once | INTRAVENOUS | Status: AC
  Administered 2016-08-08: 20 mg via INTRAVENOUS

## 2016-08-08 MED ORDER — HEPARIN SOD (PORK) LOCK FLUSH 100 UNIT/ML IV SOLN
500.0000 [IU] | Freq: Once | INTRAVENOUS | Status: AC | PRN
  Administered 2016-08-08: 500 [IU]
  Filled 2016-08-08: qty 5

## 2016-08-08 MED ORDER — PACLITAXEL CHEMO INJECTION 300 MG/50ML
50.0000 mg/m2 | Freq: Once | INTRAVENOUS | Status: AC
  Administered 2016-08-08: 132 mg via INTRAVENOUS
  Filled 2016-08-08: qty 22

## 2016-08-08 MED ORDER — DIPHENHYDRAMINE HCL 50 MG/ML IJ SOLN
50.0000 mg | Freq: Once | INTRAMUSCULAR | Status: AC
  Administered 2016-08-08: 50 mg via INTRAVENOUS

## 2016-08-08 MED FILL — TRIAMCINOLONE 0.025% OINT: 0.025 | 10 days supply | Qty: 30 | Fill #0

## 2016-08-08 NOTE — Patient Instructions (Signed)
[facility] Discharge Instructions for Patients Receiving Chemotherapy  Today you received the following chemotherapy agents: Taxol.  To help prevent nausea and vomiting after your treatment, we encourage you to take your nausea medication as prescribed.   If you develop nausea and vomiting that is not controlled by your nausea medication, call the clinic.   BELOW ARE SYMPTOMS THAT SHOULD BE REPORTED IMMEDIATELY:  *FEVER GREATER THAN 100.5 F  *CHILLS WITH OR WITHOUT FEVER  NAUSEA AND VOMITING THAT IS NOT CONTROLLED WITH YOUR NAUSEA MEDICATION  *UNUSUAL SHORTNESS OF BREATH  *UNUSUAL BRUISING OR BLEEDING  TENDERNESS IN MOUTH AND THROAT WITH OR WITHOUT PRESENCE OF ULCERS  *URINARY PROBLEMS  *BOWEL PROBLEMS  UNUSUAL RASH Items with * indicate a potential emergency and should be followed up as soon as possible.  Feel free to call the clinic you have any questions or concerns. The clinic phone number is [phone].  Please show the [facility] at check-in to the Emergency Department and triage nurse.  Trastuzumab injection for infusion What is this medicine? TRASTUZUMAB (tras TOO zoo mab) is a monoclonal antibody. It is used to treat breast cancer and stomach cancer. This medicine may be used for other purposes; ask your health care provider or pharmacist if you have questions. COMMON BRAND NAME(S): Herceptin What should I tell my health care provider before I take this medicine? They need to know if you have any of these conditions: -heart disease -heart failure -infection (especially a virus infection such as chickenpox, cold sores, or herpes) -lung or breathing disease, like asthma -recent or ongoing radiation therapy -an unusual or allergic reaction to trastuzumab, benzyl alcohol, or other medications, foods, dyes, or preservatives -pregnant or trying to get pregnant -breast-feeding How should I use this medicine? This drug is given as an  infusion into a vein. It is administered in a hospital or clinic by a specially trained health care professional. Talk to your pediatrician regarding the use of this medicine in children. This medicine is not approved for use in children. Overdosage: If you think you have taken too much of this medicine contact a poison control center or emergency room at once. NOTE: This medicine is only for you. Do not share this medicine with others. What if I miss a dose? It is important not to miss a dose. Call your doctor or health care professional if you are unable to keep an appointment. What may interact with this medicine? -doxorubicin -warfarin This list may not describe all possible interactions. Give your health care provider a list of all the medicines, herbs, non-prescription drugs, or dietary supplements you use. Also tell them if you smoke, drink alcohol, or use illegal drugs. Some items may interact with your medicine. What should I watch for while using this medicine? Visit your doctor for checks on your progress. Report any side effects. Continue your course of treatment even though you feel ill unless your doctor tells you to stop. Call your doctor or health care professional for advice if you get a fever, chills or sore throat, or other symptoms of a cold or flu. Do not treat yourself. Try to avoid being around people who are sick. You may experience fever, chills and shaking during your first infusion. These effects are usually mild and can be treated with other medicines. Report any side effects during the infusion to your health care professional. Fever and chills usually do not happen with later infusions. Do not become pregnant while taking  this medicine or for 7 months after stopping it. Women should inform their doctor if they wish to become pregnant or think they might be pregnant. Women of child-bearing potential will need to have a negative pregnancy test before starting this medicine.  There is a potential for serious side effects to an unborn child. Talk to your health care professional or pharmacist for more information. Do not breast-feed an infant while taking this medicine or for 7 months after stopping it. Women must use effective birth control with this medicine. What side effects may I notice from receiving this medicine? Side effects that you should report to your doctor or health care professional as soon as possible: -breathing difficulties -chest pain or palpitations -cough -dizziness or fainting -fever or chills, sore throat -skin rash, itching or hives -swelling of the legs or ankles -unusually weak or tired Side effects that usually do not require medical attention (report to your doctor or health care professional if they continue or are bothersome): -loss of appetite -headache -muscle aches -nausea This list may not describe all possible side effects. Call your doctor for medical advice about side effects. You may report side effects to FDA at 1-800-FDA-1088. Where should I keep my medicine? This drug is given in a hospital or clinic and will not be stored at home. NOTE: This sheet is a summary. It may not cover all possible information. If you have questions about this medicine, talk to your doctor, pharmacist, or health care provider.  2017 [employer] Standard (2015-07-12 17:16:44)  Pertuzumab injection What is this medicine? PERTUZUMAB (per TOOZ ue mab) is a monoclonal antibody. It is used to treat breast cancer. COMMON BRAND NAME(S): PERJETA What should I tell my health care provider before I take this medicine? They need to know if you have any of these conditions: -heart disease -heart failure -high blood pressure -history of irregular heart beat -recent or ongoing radiation therapy -an unusual or allergic reaction to pertuzumab, other medicines, foods, dyes, or preservatives -pregnant or trying to get pregnant -breast-feeding How  should I use this medicine? This medicine is for infusion into a vein. It is given by a health care professional in a hospital or clinic setting. Talk to your pediatrician regarding the use of this medicine in children. Special care may be needed. What if I miss a dose? It is important not to miss your dose. Call your doctor or health care professional if you are unable to keep an appointment. What may interact with this medicine? Interactions are not expected. Give your health care provider a list of all the medicines, herbs, non-prescription drugs, or dietary supplements you use. Also tell them if you smoke, drink alcohol, or use illegal drugs. Some items may interact with your medicine. What should I watch for while using this medicine? Your condition will be monitored carefully while you are receiving this medicine. Report any side effects. Continue your course of treatment even though you feel ill unless your doctor tells you to stop. Do not become pregnant while taking this medicine or for 7 months after stopping it. Women should inform their doctor if they wish to become pregnant or think they might be pregnant. Women of child-bearing potential will need to have a negative pregnancy test before starting this medicine. There is a potential for serious side effects to an unborn child. Talk to your health care professional or pharmacist for more information. Do not breast-feed an infant while taking this medicine or for 7 months after  stopping it. Women must use effective birth control with this medicine. Call your doctor or health care professional for advice if you get a fever, chills or sore throat, or other symptoms of a cold or flu. Do not treat yourself. Try to avoid being around people who are sick. You may experience fever, chills, and headache during the infusion. Report any side effects during the infusion to your health care professional. What side effects may I notice from receiving  this medicine? Side effects that you should report to your doctor or health care professional as soon as possible: -breathing problems -chest pain or palpitations -dizziness -feeling faint or lightheaded -fever or chills -skin rash, itching or hives -sore throat -swelling of the face, lips, or tongue -swelling of the legs or ankles -unusually weak or tired Side effects that usually do not require medical attention (report to your doctor or health care professional if they continue or are bothersome): -diarrhea -hair loss -nausea, vomiting -tiredness Where should I keep my medicine? This drug is given in a hospital or clinic and will not be stored at home.  2017 [employer]/Gold Standard (2015-07-13 12:08:50)    Paclitaxel injection What is this medicine? PACLITAXEL (PAK li TAX el) is a chemotherapy drug. It targets fast dividing cells, like cancer cells, and causes these cells to die. This medicine is used to treat ovarian cancer, breast cancer, and other cancers. This medicine may be used for other purposes; ask your health care provider or pharmacist if you have questions. COMMON BRAND NAME(S): Onxol, Taxol What should I tell my health care provider before I take this medicine? They need to know if you have any of these conditions: -blood disorders -irregular heartbeat -infection (especially a virus infection such as chickenpox, cold sores, or herpes) -liver disease -previous or ongoing radiation therapy -an unusual or allergic reaction to paclitaxel, alcohol, polyoxyethylated castor oil, other chemotherapy agents, other medicines, foods, dyes, or preservatives -pregnant or trying to get pregnant -breast-feeding How should I use this medicine? This drug is given as an infusion into a vein. It is administered in a hospital or clinic by a specially trained health care professional. Talk to your pediatrician regarding the use of this medicine in children. Special care may be  needed. Overdosage: If you think you have taken too much of this medicine contact a poison control center or emergency room at once. NOTE: This medicine is only for you. Do not share this medicine with others. What if I miss a dose? It is important not to miss your dose. Call your doctor or health care professional if you are unable to keep an appointment. What may interact with this medicine? Do not take this medicine with any of the following medications: -disulfiram -metronidazole This medicine may also interact with the following medications: -cyclosporine -diazepam -ketoconazole -medicines to increase blood counts like filgrastim, pegfilgrastim, sargramostim -other chemotherapy drugs like cisplatin, doxorubicin, epirubicin, etoposide, teniposide, vincristine -quinidine -testosterone -vaccines -verapamil Talk to your doctor or health care professional before taking any of these medicines: -acetaminophen -aspirin -ibuprofen -ketoprofen -naproxen This list may not describe all possible interactions. Give your health care provider a list of all the medicines, herbs, non-prescription drugs, or dietary supplements you use. Also tell them if you smoke, drink alcohol, or use illegal drugs. Some items may interact with your medicine. What should I watch for while using this medicine? Your condition will be monitored carefully while you are receiving this medicine. You will need important blood work done while  you are taking this medicine. This medicine can cause serious allergic reactions. To reduce your risk you will need to take other medicine(s) before treatment with this medicine. If you experience allergic reactions like skin rash, itching or hives, swelling of the face, lips, or tongue, tell your doctor or health care professional right away. In some cases, you may be given additional medicines to help with side effects. Follow all directions for their use. This drug may make you feel  generally unwell. This is not uncommon, as chemotherapy can affect healthy cells as well as cancer cells. Report any side effects. Continue your course of treatment even though you feel ill unless your doctor tells you to stop. Call your doctor or health care professional for advice if you get a fever, chills or sore throat, or other symptoms of a cold or flu. Do not treat yourself. This drug decreases your body's ability to fight infections. Try to avoid being around people who are sick. This medicine may increase your risk to bruise or bleed. Call your doctor or health care professional if you notice any unusual bleeding. Be careful brushing and flossing your teeth or using a toothpick because you may get an infection or bleed more easily. If you have any dental work done, tell your dentist you are receiving this medicine. Avoid taking products that contain aspirin, acetaminophen, ibuprofen, naproxen, or ketoprofen unless instructed by your doctor. These medicines may hide a fever. Do not become pregnant while taking this medicine. Women should inform their doctor if they wish to become pregnant or think they might be pregnant. There is a potential for serious side effects to an unborn child. Talk to your health care professional or pharmacist for more information. Do not breast-feed an infant while taking this medicine. Men are advised not to father a child while receiving this medicine. This product may contain alcohol. Ask your pharmacist or healthcare provider if this medicine contains alcohol. Be sure to tell all healthcare providers you are taking this medicine. Certain medicines, like metronidazole and disulfiram, can cause an unpleasant reaction when taken with alcohol. The reaction includes flushing, headache, nausea, vomiting, sweating, and increased thirst. The reaction can last from 30 minutes to several hours. What side effects may I notice from receiving this medicine? Side effects that you  should report to your doctor or health care professional as soon as possible: -allergic reactions like skin rash, itching or hives, swelling of the face, lips, or tongue -low blood counts - This drug may decrease the number of white blood cells, red blood cells and platelets. You may be at increased risk for infections and bleeding. -signs of infection - fever or chills, cough, sore throat, pain or difficulty passing urine -signs of decreased platelets or bleeding - bruising, pinpoint red spots on the skin, black, tarry stools, nosebleeds -signs of decreased red blood cells - unusually weak or tired, fainting spells, lightheadedness -breathing problems -chest pain -high or low blood pressure -mouth sores -nausea and vomiting -pain, swelling, redness or irritation at the injection site -pain, tingling, numbness in the hands or feet -slow or irregular heartbeat -swelling of the ankle, feet, hands Side effects that usually do not require medical attention (report to your doctor or health care professional if they continue or are bothersome): -bone pain -complete hair loss including hair on your head, underarms, pubic hair, eyebrows, and eyelashes -changes in the color of fingernails -diarrhea -loosening of the fingernails -loss of appetite -muscle or joint pain -red  flush to skin -sweating This list may not describe all possible side effects. Call your doctor for medical advice about side effects. You may report side effects to FDA at 1-800-FDA-1088. Where should I keep my medicine? This drug is given in a hospital or clinic and will not be stored at home. NOTE: This sheet is a summary. It may not cover all possible information. If you have questions about this medicine, talk to your doctor, pharmacist, or health care provider.  2017 [employer] Standard (2015-04-11 19:58:00)

## 2016-08-08 NOTE — Patient Instructions (Signed)
Use Neutrogena T-Gel for dry scalp daily.  Use Triamcinolone cream BID to the rash on the scalp.

## 2016-08-08 NOTE — Progress Notes (Signed)
SYMPTOM MANAGEMENT CLINIC    Chief Complaint: Rash  HPI:  [name] 66 y.o. female diagnosed with left breast cancer currently undergoing chemotherapy with Taxol, Herceptin, and Perjeta. The patient is seen today to request of nursing for reports of a rash to the back of her head. The patient reports a three-day history of dry skin as well as itching to the back of her head. It is painful at times when she is scratching. The left side feels worse than the right side. Denies rash anywhere else on her body. Has been putting tea tree oil on her skin on the scalp. States that she is prone to dry skin but denies any history of eczema or psoriasis.    Malignant neoplasm of upper-outer quadrant of left female breast ([place])   05/13/2016 Initial Diagnosis    Left breast biopsy 3:00: IDC, grade 3, ER 30%, PR 0%, Ki-67 40%, HER-2 positive ratio 2.71, screening detected left breast mass 9 x 7 x 6 mm, axilla negative, T1 BN 0 stage IA clinical stage      06/21/2016 Surgery    Left lumpectomy: IDC grade 3, 1.1 cm, 1/5 LN Positive with ECE, Margins Neg, LVI Present; Er 30%, PR 0%, Ki 67 40%, Her 2 Positive Ratio 2.71; T1CN1 (Stage 2B)      07/25/2016 -  Chemotherapy    Taxol weekly 12; Herceptin and Perjeta every 3 weeks        Review of Systems  Constitutional: Negative.   HENT: Negative.   Eyes: Negative.   Respiratory: Negative.   Cardiovascular: Negative.   Gastrointestinal: Negative.   Genitourinary: Negative.   Musculoskeletal: Negative.   Skin: Positive for itching and rash.       Dry skin as noted in HPI.  Neurological: Negative.   Endo/Heme/Allergies: Negative.   Psychiatric/Behavioral: Negative.     Past Medical History:  Diagnosis Date  . Anemia 04/05/2012  . Anxiety   . Anxiety state 09/11/2007   Qualifier: Diagnosis of  By: Scherrie Gerlach    . Atrial tachycardia, paroxysmal ([place])   . Back pain 10/02/2014  . Chicken pox as a child  . Chronic diastolic CHF  (congestive heart failure), NYHA class 1 ([place])   . Complication of anesthesia    pt states feels different in her body after anesthesia when waking up and also experiences a smell of burnt plastic for approx a wk   . Elevated LFTs 04/05/2012  . GERD (gastroesophageal reflux disease)   . Goiter   . Heart murmur    hx of one at birth   . History of kidney stones   . Hx of colonic polyps   . Hyperlipidemia   . Hypertension   . Malignant neoplasm of upper-outer quadrant of left female breast ([place]) 05/14/2016   not with patient  . Measles as a child  . Mumps as a child  . OA (osteoarthritis) of knee 04/05/2012  . Obesity 07/11/2014  . PONV (postoperative nausea and vomiting)   . Preventative health care 09/05/2013  . PVC's (premature ventricular contractions) 05/02/2016  . Shortness of breath dyspnea    walking distances / climbing stairs  . Sleep apnea 04/05/2012  . Tinea corporis 02/23/2013  . Vasomotor rhinitis 04/05/2012  . Ventral hernia     Past Surgical History:  Procedure Laterality Date  . BREAST LUMPECTOMY WITH RADIOACTIVE SEED AND SENTINEL LYMPH NODE BIOPSY Left 06/21/2016   Procedure: LEFT BREAST LUMPECTOMY WITH RADIOACTIVE SEED AND SENTINEL LYMPH  NODE BIOPSY, INJECT BLUE DYE LEFT BREAST;  Surgeon: Fanny Skates, MD;  Location: [facility];  Service: General;  Laterality: Left;  . CARDIAC CATHETERIZATION     normal coroary arteries per patient  . CARDIOVASCULAR STRESS TEST     10/12/2013  . CESAREAN SECTION     X 3  . CHOLECYSTECTOMY    . COLONOSCOPY WITH PROPOFOL N/A 03/28/2015   Procedure: COLONOSCOPY WITH PROPOFOL;  Surgeon: Juanita Craver, MD;  Location: [facility];  Service: Endoscopy;  Laterality: N/A;  . HERNIA REPAIR  02/08/11   ventral hernia  . JOINT REPLACEMENT     bilateral  . KNEE ARTHROSCOPY  05/2010   bilateral  . LEFT AND RIGHT HEART CATHETERIZATION WITH CORONARY ANGIOGRAM N/A 11/08/2013   Procedure: LEFT AND RIGHT HEART CATHETERIZATION WITH CORONARY  ANGIOGRAM;  Surgeon: [name], MD;  Location: [facility] CATH LAB;  Service: Cardiovascular;  Laterality: N/A;  . MENISCUS REPAIR  2009  . MOUTH SURGERY     teeth implants  . PORTACATH PLACEMENT Right 07/16/2016   Procedure: INSERTION PORT-A-CATH RIGHT INTERNAL JUGULAR WITH ULTRASOUND;  Surgeon: Fanny Skates, MD;  Location: [facility];  Service: General;  Laterality: Right;  . TOTAL KNEE ARTHROPLASTY  2011   left  . TOTAL KNEE ARTHROPLASTY Right 05/10/2014   Procedure: RIGHT TOTAL KNEE ARTHROPLASTY;  Surgeon: Mauri Pole, MD;  Location: [facility];  Service: Orthopedics;  Laterality: Right;  . TUBAL LIGATION    . [place]    has Hyperlipidemia; Anxiety and depression; Essential hypertension; GERD; RENAL CALCULUS; RENAL CYST; COLONIC POLYPS, HX OF; Hernia; Vasomotor rhinitis; Obstructive sleep apnea; Anemia; Elevated LFTs; OA (osteoarthritis) of knee; Musculoskeletal pain; Tinea corporis; Undiagnosed cardiac murmurs; Preventative health care; SOB (shortness of breath); Chronic diastolic CHF (congestive heart failure) ([place]); CAD (coronary artery disease), native coronary artery; Acute sinusitis with symptoms > 10 days; Hyperglycemia; Atrial tachycardia, paroxysmal ([place]); S/P right TKA; S/P knee replacement; Obesity; Back pain; Tonsillar hypertrophy; LVH (left ventricular hypertrophy) due to hypertensive disease, with heart failure ([place]); Neck mass; PVC's (premature ventricular contractions); Malignant neoplasm of upper-outer quadrant of left female breast ([place]); and Port catheter in place on her problem list.    is allergic to no known allergies.  Allergies as of 08/08/2016      Reactions   No Known Allergies       Medication List       Accurate as of 08/08/16  2:04 PM. Always use your most recent med list.          acetaminophen 500 MG tablet Commonly known as:  TYLENOL Take 1,000 mg by mouth 2 (two) times daily as needed for moderate pain or headache.     albuterol 108 (90 Base) MCG/ACT inhaler Commonly known as:  PROVENTIL HFA;VENTOLIN HFA Inhale 2 puffs into the lungs every 6 (six) hours as needed for wheezing or shortness of breath.   ARTIFICIAL TEARS OP Apply 1 drop to eye daily as needed (dry eyes).   aspirin EC 81 MG tablet Take 81 mg by mouth at bedtime.   beclomethasone 80 MCG/ACT inhaler Commonly known as:  QVAR Inhale 1-2 puffs into the lungs 2 (two) times daily.   clotrimazole-betamethasone cream Commonly known as:  LOTRISONE Apply 1 application topically 2 (two) times daily as needed (for rash).   diclofenac 75 MG EC tablet Commonly known as:  VOLTAREN Take 1 tablet (75 mg total) by mouth 2 (two) times daily.   diltiazem 120 MG 24  hr capsule Commonly known as:  CARDIZEM CD Take 1 capsule (120 mg total) by mouth daily.   escitalopram 20 MG tablet Commonly known as:  LEXAPRO TAKE 1 TABLET ONE TIME DAILY   esomeprazole 40 MG capsule Commonly known as:  NEXIUM Take 40 mg by mouth daily.   fluticasone 50 MCG/ACT nasal spray Commonly known as:  FLONASE Place 1 spray into both nostrils daily as needed for allergies.   furosemide 20 MG tablet Commonly known as:  LASIX Take 1 tablet (20 mg total) by mouth daily.   HYDROcodone-acetaminophen 5-325 MG tablet Commonly known as:  NORCO Take 1-2 tablets by mouth every 6 (six) hours as needed for moderate pain or severe pain.   lidocaine-prilocaine cream Commonly known as:  EMLA Apply to affected area once   LORazepam 0.5 MG tablet Commonly known as:  ATIVAN Take 1 tablet (0.5 mg total) by mouth every 6 (six) hours as needed (Nausea or vomiting).   losartan 100 MG tablet Commonly known as:  COZAAR Take 1 tablet (100 mg total) by mouth daily.   montelukast 10 MG tablet Commonly known as:  SINGULAIR Take 1 tablet (10 mg total) by mouth at bedtime.   nebivolol 10 MG tablet Commonly known as:  BYSTOLIC Take 1 tablet (10 mg total) by mouth at bedtime.    ondansetron 8 MG tablet Commonly known as:  ZOFRAN Take 1 tablet (8 mg total) by mouth 2 (two) times daily as needed (Nausea or vomiting).   prochlorperazine 10 MG tablet Commonly known as:  COMPAZINE Take 1 tablet (10 mg total) by mouth every 6 (six) hours as needed (Nausea or vomiting).   rosuvastatin 40 MG tablet Commonly known as:  CRESTOR Take 1 tablet (40 mg total) by mouth daily.   triamcinolone 0.025 % ointment Commonly known as:  KENALOG Apply 1 application topically 2 (two) times daily.        PHYSICAL EXAMINATION  Oncology Vitals 08/08/2016 08/08/2016  Height - -  Weight - -  Weight (lbs) - -  BMI (kg/m2) - -  Temp - 98.4  Pulse 74 70  Resp - 20  SpO2 - 99  BSA (m2) - -   BP Readings from Last 2 Encounters:  08/08/16 (!) 132/95  08/01/16 134/80    Physical Exam  Constitutional: She is well-developed, well-nourished, and in no distress. No distress.  Skin: Skin is warm and dry. Rash noted. She is not diaphoretic. There is erythema.  Red rash to her scalp. Mild excoriation noted. No drainage noted.   Psychiatric: Mood, memory, affect and judgment normal.  Vitals reviewed.   LABORATORY DATA:. No visits with results within 3 Day(s) from this visit.  Latest known visit with results is:  Infusion on 08/01/2016  Component Date Value Ref Range Status  . WBC 08/08/2016 5.7  3.9 - 10.3 10e3/uL Final  . NEUT# 08/08/2016 4.1  1.5 - 6.5 10e3/uL Final  . HGB 08/08/2016 13.1  11.6 - 15.9 g/dL Final  . HCT 08/08/2016 40.4  34.8 - 46.6 % Final  . Platelets 08/08/2016 268  145 - 400 10e3/uL Final  . MCV 08/08/2016 87.4  79.5 - 101.0 fL Final  . MCH 08/08/2016 28.4  25.1 - 34.0 pg Final  . MCHC 08/08/2016 32.4  31.5 - 36.0 g/dL Final  . RBC 08/08/2016 4.62  3.70 - 5.45 10e6/uL Final  . RDW 08/08/2016 15.0* 11.2 - 14.5 % Final  . lymph# 08/08/2016 1.2  0.9 - 3.3 10e3/uL Final  .  MONO# 08/08/2016 0.3  0.1 - 0.9 10e3/uL Final  . Eosinophils Absolute 08/08/2016 0.1   0.0 - 0.5 10e3/uL Final  . Basophils Absolute 08/08/2016 0.0  0.0 - 0.1 10e3/uL Final  . NEUT% 08/08/2016 72.2  38.4 - 76.8 % Final  . LYMPH% 08/08/2016 21.0  14.0 - 49.7 % Final  . MONO% 08/08/2016 5.5  0.0 - 14.0 % Final  . EOS% 08/08/2016 1.1  0.0 - 7.0 % Final  . BASO% 08/08/2016 0.2  0.0 - 2.0 % Final  . Sodium 08/08/2016 139  136 - 145 mEq/L Final  . Potassium 08/08/2016 4.1  3.5 - 5.1 mEq/L Final  . Chloride 08/08/2016 104  98 - 109 mEq/L Final  . CO2 08/08/2016 24  22 - 29 mEq/L Final  . Glucose 08/08/2016 125  70 - 140 mg/dl Final  . BUN 08/08/2016 16.2  7.0 - 26.0 mg/dL Final  . Creatinine 08/08/2016 0.7  0.6 - 1.1 mg/dL Final  . Total Bilirubin 08/08/2016 0.45  0.20 - 1.20 mg/dL Final  . Alkaline Phosphatase 08/08/2016 88  40 - 150 U/L Final  . AST 08/08/2016 56* 5 - 34 U/L Final  . ALT 08/08/2016 65* 0 - 55 U/L Final  . Total Protein 08/08/2016 6.9  6.4 - 8.3 g/dL Final  . Albumin 08/08/2016 3.3* 3.5 - 5.0 g/dL Final  . Calcium 08/08/2016 9.8  8.4 - 10.4 mg/dL Final  . Anion Gap 08/08/2016 10  3 - 11 mEq/L Final  . EGFR 08/08/2016 86* >90 ml/min/1.73 m2 Final    RADIOGRAPHIC STUDIES: No results found.  ASSESSMENT/PLAN:    No problem-specific Assessment & Plan notes found for this encounter.  This is a 66 year old female currently being treated for breast cancer. She has now developed a rash to her scalp. This may be consistent with dry skin versus eczema. I have instructed the patient to stop using tea tree oil to this area. She should use Neutrogena T-Gel shampoo on her scalp. I have also prescription for triamcinolone cream to be used for itching twice a day. She will keep her follow-up with Dr. Lindi Adie as previously scheduled.   Patient stated understanding of all instructions; and was in agreement with this plan of care. The patient knows to call the clinic with any problems, questions or concerns.   Total time spent with patient was 15 minutes;  with greater than  50 percent of that time spent in face to face counseling regarding patient's symptoms,  and coordination of care and follow up.   Mikey Bussing, NP 08/08/2016

## 2016-08-15 ENCOUNTER — Other Ambulatory Visit (HOSPITAL_BASED_OUTPATIENT_CLINIC_OR_DEPARTMENT_OTHER): Payer: Medicare HMO

## 2016-08-15 ENCOUNTER — Ambulatory Visit (HOSPITAL_BASED_OUTPATIENT_CLINIC_OR_DEPARTMENT_OTHER): Payer: Medicare HMO

## 2016-08-15 ENCOUNTER — Ambulatory Visit: Payer: Medicare HMO

## 2016-08-15 ENCOUNTER — Ambulatory Visit (HOSPITAL_BASED_OUTPATIENT_CLINIC_OR_DEPARTMENT_OTHER): Payer: Medicare HMO | Admitting: Hematology and Oncology

## 2016-08-15 ENCOUNTER — Encounter: Payer: Self-pay | Admitting: Hematology and Oncology

## 2016-08-15 DIAGNOSIS — C50412 Malignant neoplasm of upper-outer quadrant of left female breast: Secondary | ICD-10-CM | POA: Diagnosis not present

## 2016-08-15 DIAGNOSIS — Z5112 Encounter for antineoplastic immunotherapy: Secondary | ICD-10-CM | POA: Diagnosis not present

## 2016-08-15 DIAGNOSIS — Z170 Estrogen receptor positive status [ER+]: Principal | ICD-10-CM

## 2016-08-15 DIAGNOSIS — Z5111 Encounter for antineoplastic chemotherapy: Secondary | ICD-10-CM

## 2016-08-15 DIAGNOSIS — I509 Heart failure, unspecified: Secondary | ICD-10-CM

## 2016-08-15 LAB — COMPREHENSIVE METABOLIC PANEL
ALT: 69 U/L — AB (ref 0–55)
AST: 47 U/L — AB (ref 5–34)
Albumin: 3.4 g/dL — ABNORMAL LOW (ref 3.5–5.0)
Alkaline Phosphatase: 93 U/L (ref 40–150)
Anion Gap: 11 mEq/L (ref 3–11)
BILIRUBIN TOTAL: 0.36 mg/dL (ref 0.20–1.20)
BUN: 18.1 mg/dL (ref 7.0–26.0)
CALCIUM: 9.6 mg/dL (ref 8.4–10.4)
CHLORIDE: 106 meq/L (ref 98–109)
CO2: 23 meq/L (ref 22–29)
CREATININE: 0.8 mg/dL (ref 0.6–1.1)
EGFR: 83 mL/min/{1.73_m2} — ABNORMAL LOW (ref >90)
Glucose: 143 mg/dl — ABNORMAL HIGH (ref 70–140)
Potassium: 4 mEq/L (ref 3.5–5.1)
Sodium: 140 mEq/L (ref 136–145)
TOTAL PROTEIN: 7.1 g/dL (ref 6.4–8.3)

## 2016-08-15 LAB — CBC WITH DIFFERENTIAL/PLATELET
BASO%: 0.3 % (ref 0.0–2.0)
Basophils Absolute: 0 10*3/uL (ref 0.0–0.1)
EOS%: 1.1 % (ref 0.0–7.0)
Eosinophils Absolute: 0.1 10*3/uL (ref 0.0–0.5)
HEMATOCRIT: 41 % (ref 34.8–46.6)
HGB: 13.2 g/dL (ref 11.6–15.9)
LYMPH#: 1.5 10*3/uL (ref 0.9–3.3)
LYMPH%: 19.3 % (ref 14.0–49.7)
MCH: 28.4 pg (ref 25.1–34.0)
MCHC: 32.2 g/dL (ref 31.5–36.0)
MCV: 88.2 fL (ref 79.5–101.0)
MONO#: 0.5 10*3/uL (ref 0.1–0.9)
MONO%: 6.3 % (ref 0.0–14.0)
NEUT%: 73 % (ref 38.4–76.8)
NEUTROS ABS: 5.5 10*3/uL (ref 1.5–6.5)
Platelets: 308 10*3/uL (ref 145–400)
RBC: 4.65 10*6/uL (ref 3.70–5.45)
RDW: 15.4 % — ABNORMAL HIGH (ref 11.2–14.5)
WBC: 7.5 10*3/uL (ref 3.9–10.3)

## 2016-08-15 MED ORDER — FAMOTIDINE IN NACL 20-0.9 MG/50ML-% IV SOLN
INTRAVENOUS | Status: AC
  Filled 2016-08-15: qty 50

## 2016-08-15 MED ORDER — SODIUM CHLORIDE 0.9 % IV SOLN
420.0000 mg | Freq: Once | INTRAVENOUS | Status: AC
  Administered 2016-08-15: 420 mg via INTRAVENOUS
  Filled 2016-08-15: qty 14

## 2016-08-15 MED ORDER — SODIUM CHLORIDE 0.9% FLUSH
10.0000 mL | INTRAVENOUS | Status: DC | PRN
Start: 2016-08-15 — End: 2016-08-15
  Administered 2016-08-15: 10 mL
  Filled 2016-08-15: qty 10

## 2016-08-15 MED ORDER — DIPHENHYDRAMINE HCL 50 MG/ML IJ SOLN
INTRAMUSCULAR | Status: AC
  Filled 2016-08-15: qty 1

## 2016-08-15 MED ORDER — DIPHENHYDRAMINE HCL 25 MG PO CAPS: 50.0000 mg | ORAL_CAPSULE | Freq: Once | ORAL | Status: DC

## 2016-08-15 MED ORDER — DEXTROSE 5 % IV SOLN
50.0000 mg/m2 | Freq: Once | INTRAVENOUS | Status: AC
  Administered 2016-08-15: 132 mg via INTRAVENOUS
  Filled 2016-08-15: qty 22

## 2016-08-15 MED ORDER — FAMOTIDINE IN NACL 20-0.9 MG/50ML-% IV SOLN
20.0000 mg | Freq: Once | INTRAVENOUS | Status: AC
  Administered 2016-08-15: 20 mg via INTRAVENOUS

## 2016-08-15 MED ORDER — ACETAMINOPHEN 325 MG PO TABS
650.0000 mg | ORAL_TABLET | Freq: Once | ORAL | Status: AC
  Administered 2016-08-15: 650 mg via ORAL

## 2016-08-15 MED ORDER — TRASTUZUMAB CHEMO 150 MG IV SOLR
6.0000 mg/kg | Freq: Once | INTRAVENOUS | Status: AC
  Administered 2016-08-15: 882 mg via INTRAVENOUS
  Filled 2016-08-15: qty 42

## 2016-08-15 MED ORDER — ACETAMINOPHEN 325 MG PO TABS
ORAL_TABLET | ORAL | Status: AC
  Filled 2016-08-15: qty 2

## 2016-08-15 MED ORDER — SODIUM CHLORIDE 0.9 % IV SOLN
Freq: Once | INTRAVENOUS | Status: AC
  Administered 2016-08-15: 09:00:00 via INTRAVENOUS

## 2016-08-15 MED ORDER — HEPARIN SOD (PORK) LOCK FLUSH 100 UNIT/ML IV SOLN
500.0000 [IU] | Freq: Once | INTRAVENOUS | Status: AC | PRN
  Administered 2016-08-15: 500 [IU]
  Filled 2016-08-15: qty 5

## 2016-08-15 MED ORDER — DIPHENHYDRAMINE HCL 50 MG/ML IJ SOLN
50.0000 mg | Freq: Once | INTRAMUSCULAR | Status: AC
  Administered 2016-08-15: 50 mg via INTRAVENOUS

## 2016-08-15 MED ORDER — SODIUM CHLORIDE 0.9 % IV SOLN
20.0000 mg | Freq: Once | INTRAVENOUS | Status: AC
  Administered 2016-08-15: 20 mg via INTRAVENOUS
  Filled 2016-08-15: qty 2

## 2016-08-15 NOTE — Assessment & Plan Note (Signed)
06/21/16: Left lumpectomy: IDC grade 3, 1.1 cm, 1/5 LN Positive with ECE, Margins Neg, LVI Present; Er 30%, PR 0%, Ki 67 40%, Her 2 Positive Ratio 2.71; T1CN1 (Stage 2B)  Patient has multiple comorbidities including history of LVH with congestive heart failure Treatment Plan: 1. consideration for adjuvant Taxol with Herceptin And Perjetaif her cardiologist allows [name] to treat her with anti-HER-2 therapy. (Last echocardiogram showed an ejection fraction of 55-60%) 2. Followed by adjuvant radiation 3. Followed by adjuvant antiestrogen therapy with anastrozole 1 mg daily 5 years (bone density 04/29/2016 T score -1.2) ----------------------------------------------------------------------------------------------------------------------------------------------------- Current Treatment: Cycle 4 weekly Taxol; Herceptin and Perjeta Q 3 weeks cycle 2 Chemotherapy toxicities: 1. Nausea vomiting during chemotherapy  Blood counts are reviewed and are adequate for treatment.  Denies any diarrhea. Denies neuropathy   Return to clinic in 3 weeks for cycle 7 Taxol and cycle 3 Herceptin and Perjeta

## 2016-08-15 NOTE — Progress Notes (Signed)
Patient Care Team: [name], MD as PCP - General (Family Medicine) Fanny Skates, MD as Consulting Physician (General Surgery) Nicholas Lose, MD as Consulting Physician (Hematology and Oncology) Gery Pray, MD as Consulting Physician (Radiation Oncology)  DIAGNOSIS:  Encounter Diagnosis  Name Primary?  . Malignant neoplasm of upper-outer quadrant of left breast in female, estrogen receptor positive ([place])     SUMMARY OF ONCOLOGIC HISTORY:   Malignant neoplasm of upper-outer quadrant of left female breast ([place])   05/13/2016 Initial Diagnosis    Left breast biopsy 3:00: IDC, grade 3, ER 30%, PR 0%, Ki-67 40%, HER-2 positive ratio 2.71, screening detected left breast mass 9 x 7 x 6 mm, axilla negative, T1 BN 0 stage IA clinical stage      06/21/2016 Surgery    Left lumpectomy: IDC grade 3, 1.1 cm, 1/5 LN Positive with ECE, Margins Neg, LVI Present; Er 30%, PR 0%, Ki 67 40%, Her 2 Positive Ratio 2.71; T1CN1 (Stage 2B)      07/25/2016 -  Chemotherapy    Taxol weekly 12; Herceptin and Perjeta every 3 weeks       CHIEF COMPLIANT: Cycle 4 Taxol and cycle to Herceptin Perjeta  INTERVAL HISTORY: [name] is a 66 year old with above-mentioned history of left breast cancer currently on adjuvant chemotherapy. Today is cycle 4 of Taxol. She is tolerating chemotherapy extremely well. She does not have any nausea vomiting. She had diarrhea from Herceptin and Perjeta for a couple of days which got better with Imodium. She has excellent appetite. She is drinking enough water. Denies neuropathy.  REVIEW OF SYSTEMS:   Constitutional: Denies fevers, chills or abnormal weight loss Eyes: Denies blurriness of vision Ears, nose, mouth, throat, and face: Denies mucositis or sore throat Respiratory: Denies cough, dyspnea or wheezes Cardiovascular: Denies palpitation, chest discomfort Gastrointestinal:  Denies nausea, heartburn or change in bowel habits Skin: Denies abnormal skin  rashes Lymphatics: Denies new lymphadenopathy or easy bruising Neurological:Denies numbness, tingling or new weaknesses Behavioral/Psych: Mood is stable, no new changes  Extremities: No lower extremity edema Breast:  denies any pain or lumps or nodules in either breasts All other systems were reviewed with the patient and are negative.  I have reviewed the past medical history, past surgical history, social history and family history with the patient and they are unchanged from previous note.  ALLERGIES:  is allergic to no known allergies.  MEDICATIONS:  Current Outpatient Prescriptions  Medication Sig Dispense Refill  . acetaminophen (TYLENOL) 500 MG tablet Take 1,000 mg by mouth 2 (two) times daily as needed for moderate pain or headache.    . albuterol (PROVENTIL HFA;VENTOLIN HFA) 108 (90 Base) MCG/ACT inhaler Inhale 2 puffs into the lungs every 6 (six) hours as needed for wheezing or shortness of breath. 1 Inhaler 6  . aspirin EC 81 MG tablet Take 81 mg by mouth at bedtime.     . beclomethasone (QVAR) 80 MCG/ACT inhaler Inhale 1-2 puffs into the lungs 2 (two) times daily. (Patient taking differently: Inhale 1-2 puffs into the lungs 2 (two) times daily as needed (shortness of breath). ) 1 Inhaler 5  . clotrimazole-betamethasone (LOTRISONE) cream Apply 1 application topically 2 (two) times daily as needed (for rash). 30 g 0  . diclofenac (VOLTAREN) 75 MG EC tablet Take 1 tablet (75 mg total) by mouth 2 (two) times daily. 180 tablet 1  . diltiazem (CARDIZEM CD) 120 MG 24 hr capsule Take 1 capsule (120 mg total) by mouth daily.  90 capsule 2  . escitalopram (LEXAPRO) 20 MG tablet TAKE 1 TABLET ONE TIME DAILY 90 tablet 4  . esomeprazole (NEXIUM) 40 MG capsule Take 40 mg by mouth daily.     . fluticasone (FLONASE) 50 MCG/ACT nasal spray Place 1 spray into both nostrils daily as needed for allergies. (Patient taking differently: Place 1 spray into both nostrils daily as needed (FOR SINUS ISSUE).  ) 48 g 1  . furosemide (LASIX) 20 MG tablet Take 1 tablet (20 mg total) by mouth daily. 90 tablet 2  . HYDROcodone-acetaminophen (NORCO) 5-325 MG tablet Take 1-2 tablets by mouth every 6 (six) hours as needed for moderate pain or severe pain. 30 tablet 0  . Hypromellose (ARTIFICIAL TEARS OP) Apply 1 drop to eye daily as needed (dry eyes).    Marland Kitchen lidocaine-prilocaine (EMLA) cream Apply to affected area once 30 g 3  . LORazepam (ATIVAN) 0.5 MG tablet Take 1 tablet (0.5 mg total) by mouth every 6 (six) hours as needed (Nausea or vomiting). 30 tablet 0  . losartan (COZAAR) 100 MG tablet Take 1 tablet (100 mg total) by mouth daily. 90 tablet 2  . montelukast (SINGULAIR) 10 MG tablet Take 1 tablet (10 mg total) by mouth at bedtime. 90 tablet 2  . nebivolol (BYSTOLIC) 10 MG tablet Take 1 tablet (10 mg total) by mouth at bedtime. 90 tablet 2  . ondansetron (ZOFRAN) 8 MG tablet Take 1 tablet (8 mg total) by mouth 2 (two) times daily as needed (Nausea or vomiting). 30 tablet 1  . prochlorperazine (COMPAZINE) 10 MG tablet Take 1 tablet (10 mg total) by mouth every 6 (six) hours as needed (Nausea or vomiting). 30 tablet 1  . rosuvastatin (CRESTOR) 40 MG tablet Take 1 tablet (40 mg total) by mouth daily. (Patient taking differently: Take 40 mg by mouth at bedtime. ) 90 tablet 3  . triamcinolone (KENALOG) 0.025 % ointment Apply 1 application topically 2 (two) times daily. 30 g 0   No current facility-administered medications for this visit.     PHYSICAL EXAMINATION: ECOG PERFORMANCE STATUS: 1 - Symptomatic but completely ambulatory  Vitals:   08/15/16 0837  BP: (!) 153/77  Pulse: 65  Resp: 18  Temp: 98.1 F (36.7 C)   Filed Weights   08/15/16 0837  Weight: (!) 325 lb 3.2 oz (147.5 kg)    GENERAL:alert, no distress and comfortable SKIN: skin color, texture, turgor are normal, no rashes or significant lesions EYES: normal, Conjunctiva are pink and non-injected, sclera clear OROPHARYNX:no exudate,  no erythema and lips, buccal mucosa, and tongue normal  NECK: supple, thyroid normal size, non-tender, without nodularity LYMPH:  no palpable lymphadenopathy in the cervical, axillary or inguinal LUNGS: clear to auscultation and percussion with normal breathing effort HEART: regular rate & rhythm and no murmurs and no lower extremity edema ABDOMEN:abdomen soft, non-tender and normal bowel sounds MUSCULOSKELETAL:no cyanosis of digits and no clubbing  NEURO: alert & oriented x 3 with fluent speech, no focal motor/sensory deficits EXTREMITIES: No lower extremity edema  LABORATORY DATA:  I have reviewed the data as listed   Chemistry      Component Value Date/Time   NA 139 08/08/2016 0817   K 4.1 08/08/2016 0817   CL 104 07/16/2016 0716   CO2 24 08/08/2016 0817   BUN 16.2 08/08/2016 0817   CREATININE 0.7 08/08/2016 0817      Component Value Date/Time   CALCIUM 9.8 08/08/2016 0817   ALKPHOS 88 08/08/2016 0817   AST  56 (H) 08/08/2016 0817   ALT 65 (H) 08/08/2016 0817   BILITOT 0.45 08/08/2016 0817     Lab Results  Component Value Date   WBC 7.5 08/15/2016   HGB 13.2 08/15/2016   HCT 41.0 08/15/2016   MCV 88.2 08/15/2016   PLT 308 08/15/2016   NEUTROABS 5.5 08/15/2016   ASSESSMENT & PLAN:  Malignant neoplasm of upper-outer quadrant of left female breast ([place]) 06/21/16: Left lumpectomy: IDC grade 3, 1.1 cm, 1/5 LN Positive with ECE, Margins Neg, LVI Present; Er 30%, PR 0%, Ki 67 40%, Her 2 Positive Ratio 2.71; T1CN1 (Stage 2B)  Patient has multiple comorbidities including history of LVH with congestive heart failure Treatment Plan: 1. consideration for adjuvant Taxol with Herceptin And Perjetaif her cardiologist allows [name] to treat her with anti-HER-2 therapy. (Last echocardiogram showed an ejection fraction of 55-60%) 2. Followed by adjuvant radiation 3. Followed by adjuvant antiestrogen therapy with anastrozole 1 mg daily 5 years (bone density 04/29/2016 T score  -1.2) ----------------------------------------------------------------------------------------------------------------------------------------------------- Current Treatment: Cycle 4 weekly Taxol; Herceptin and Perjeta Q 3 weeks cycle 2 Chemotherapy toxicities: 1. Nausea vomiting during chemotherapy  Blood counts are reviewed and are adequate for treatment. 2. Intermittent loose stools up to 3 a day  Denies neuropathy   Return to clinic in 3 weeks for cycle 7 Taxol and cycle 3 Herceptin and Perjeta  I spent 25 minutes talking to the patient of which more than half was spent in counseling and coordination of care.  No orders of the defined types were placed in this encounter.  The patient has a good understanding of the overall plan. she agrees with it. she will call with any problems that may develop before the next visit here.   Rulon Eisenmenger, MD 08/15/16

## 2016-08-15 NOTE — Patient Instructions (Signed)
[facility] Discharge Instructions for Patients Receiving Chemotherapy  Today you received the following chemotherapy agents:  Herceptin, Perjeta, Taxol  To help prevent nausea and vomiting after your treatment, we encourage you to take your nausea medication as prescribed.   If you develop nausea and vomiting that is not controlled by your nausea medication, call the clinic.   BELOW ARE SYMPTOMS THAT SHOULD BE REPORTED IMMEDIATELY:  *FEVER GREATER THAN 100.5 F  *CHILLS WITH OR WITHOUT FEVER  NAUSEA AND VOMITING THAT IS NOT CONTROLLED WITH YOUR NAUSEA MEDICATION  *UNUSUAL SHORTNESS OF BREATH  *UNUSUAL BRUISING OR BLEEDING  TENDERNESS IN MOUTH AND THROAT WITH OR WITHOUT PRESENCE OF ULCERS  *URINARY PROBLEMS  *BOWEL PROBLEMS  UNUSUAL RASH Items with * indicate a potential emergency and should be followed up as soon as possible.  Feel free to call the clinic you have any questions or concerns. The clinic phone number is [phone].  Please show the CHEMO ALERT CARD at check-in to the Emergency Department and triage nurse.   

## 2016-08-22 ENCOUNTER — Ambulatory Visit (HOSPITAL_BASED_OUTPATIENT_CLINIC_OR_DEPARTMENT_OTHER): Payer: Medicare HMO

## 2016-08-22 ENCOUNTER — Other Ambulatory Visit (HOSPITAL_BASED_OUTPATIENT_CLINIC_OR_DEPARTMENT_OTHER): Payer: Medicare HMO

## 2016-08-22 VITALS — BP 142/71 | HR 73 | Temp 98.3°F | Resp 20

## 2016-08-22 DIAGNOSIS — C50412 Malignant neoplasm of upper-outer quadrant of left female breast: Secondary | ICD-10-CM | POA: Diagnosis not present

## 2016-08-22 DIAGNOSIS — Z170 Estrogen receptor positive status [ER+]: Principal | ICD-10-CM

## 2016-08-22 DIAGNOSIS — Z5111 Encounter for antineoplastic chemotherapy: Secondary | ICD-10-CM

## 2016-08-22 LAB — COMPREHENSIVE METABOLIC PANEL
ALBUMIN: 3.3 g/dL — AB (ref 3.5–5.0)
ALK PHOS: 100 U/L (ref 40–150)
ALT: 65 U/L — AB (ref 0–55)
AST: 41 U/L — AB (ref 5–34)
Anion Gap: 12 mEq/L — ABNORMAL HIGH (ref 3–11)
BILIRUBIN TOTAL: 0.43 mg/dL (ref 0.20–1.20)
BUN: 16.4 mg/dL (ref 7.0–26.0)
CO2: 24 mEq/L (ref 22–29)
CREATININE: 0.8 mg/dL (ref 0.6–1.1)
Calcium: 9.7 mg/dL (ref 8.4–10.4)
Chloride: 103 mEq/L (ref 98–109)
EGFR: 74 mL/min/{1.73_m2} — ABNORMAL LOW (ref >90)
GLUCOSE: 169 mg/dL — AB (ref 70–140)
Potassium: 3.8 mEq/L (ref 3.5–5.1)
SODIUM: 139 meq/L (ref 136–145)
TOTAL PROTEIN: 6.9 g/dL (ref 6.4–8.3)

## 2016-08-22 LAB — CBC WITH DIFFERENTIAL/PLATELET
BASO%: 0.1 % (ref 0.0–2.0)
Basophils Absolute: 0 10*3/uL (ref 0.0–0.1)
EOS ABS: 0.1 10*3/uL (ref 0.0–0.5)
EOS%: 0.7 % (ref 0.0–7.0)
HCT: 41.1 % (ref 34.8–46.6)
HEMOGLOBIN: 13.3 g/dL (ref 11.6–15.9)
LYMPH%: 18.3 % (ref 14.0–49.7)
MCH: 28.5 pg (ref 25.1–34.0)
MCHC: 32.4 g/dL (ref 31.5–36.0)
MCV: 88 fL (ref 79.5–101.0)
MONO#: 0.4 10*3/uL (ref 0.1–0.9)
MONO%: 5.9 % (ref 0.0–14.0)
NEUT%: 75 % (ref 38.4–76.8)
NEUTROS ABS: 5.1 10*3/uL (ref 1.5–6.5)
Platelets: 257 10*3/uL (ref 145–400)
RBC: 4.67 10*6/uL (ref 3.70–5.45)
RDW: 15.5 % — AB (ref 11.2–14.5)
WBC: 6.8 10*3/uL (ref 3.9–10.3)
lymph#: 1.2 10*3/uL (ref 0.9–3.3)

## 2016-08-22 MED ORDER — SODIUM CHLORIDE 0.9% FLUSH
10.0000 mL | INTRAVENOUS | Status: DC | PRN
  Administered 2016-08-22: 10 mL
  Filled 2016-08-22: qty 10

## 2016-08-22 MED ORDER — SODIUM CHLORIDE 0.9 % IV SOLN
20.0000 mg | Freq: Once | INTRAVENOUS | Status: AC
  Administered 2016-08-22: 20 mg via INTRAVENOUS
  Filled 2016-08-22: qty 2

## 2016-08-22 MED ORDER — DIPHENHYDRAMINE HCL 50 MG/ML IJ SOLN
50.0000 mg | Freq: Once | INTRAMUSCULAR | Status: AC
  Administered 2016-08-22: 50 mg via INTRAVENOUS

## 2016-08-22 MED ORDER — DIPHENHYDRAMINE HCL 50 MG/ML IJ SOLN
INTRAMUSCULAR | Status: AC
  Filled 2016-08-22: qty 1

## 2016-08-22 MED ORDER — FAMOTIDINE IN NACL 20-0.9 MG/50ML-% IV SOLN
20.0000 mg | Freq: Once | INTRAVENOUS | Status: AC
  Administered 2016-08-22: 20 mg via INTRAVENOUS

## 2016-08-22 MED ORDER — FAMOTIDINE IN NACL 20-0.9 MG/50ML-% IV SOLN
INTRAVENOUS | Status: AC
  Filled 2016-08-22: qty 50

## 2016-08-22 MED ORDER — PACLITAXEL CHEMO INJECTION 300 MG/50ML
50.0000 mg/m2 | Freq: Once | INTRAVENOUS | Status: AC
  Administered 2016-08-22: 132 mg via INTRAVENOUS
  Filled 2016-08-22: qty 22

## 2016-08-22 MED ORDER — SODIUM CHLORIDE 0.9 % IV SOLN
Freq: Once | INTRAVENOUS | Status: AC
  Administered 2016-08-22: 08:00:00 via INTRAVENOUS

## 2016-08-22 MED ORDER — HEPARIN SOD (PORK) LOCK FLUSH 100 UNIT/ML IV SOLN
500.0000 [IU] | Freq: Once | INTRAVENOUS | Status: AC | PRN
  Administered 2016-08-22: 500 [IU]
  Filled 2016-08-22: qty 5

## 2016-08-22 NOTE — Patient Instructions (Signed)
[facility] Discharge Instructions for Patients Receiving Chemotherapy  Today you received the following chemotherapy agents:  Taxol  To help prevent nausea and vomiting after your treatment, we encourage you to take your nausea medication as prescribed.   If you develop nausea and vomiting that is not controlled by your nausea medication, call the clinic.   BELOW ARE SYMPTOMS THAT SHOULD BE REPORTED IMMEDIATELY:  *FEVER GREATER THAN 100.5 F  *CHILLS WITH OR WITHOUT FEVER  NAUSEA AND VOMITING THAT IS NOT CONTROLLED WITH YOUR NAUSEA MEDICATION  *UNUSUAL SHORTNESS OF BREATH  *UNUSUAL BRUISING OR BLEEDING  TENDERNESS IN MOUTH AND THROAT WITH OR WITHOUT PRESENCE OF ULCERS  *URINARY PROBLEMS  *BOWEL PROBLEMS  UNUSUAL RASH Items with * indicate a potential emergency and should be followed up as soon as possible.  Feel free to call the clinic you have any questions or concerns. The clinic phone number is [phone].  Please show the CHEMO ALERT CARD at check-in to the Emergency Department and triage nurse.   

## 2016-08-23 ENCOUNTER — Encounter: Payer: Self-pay | Admitting: *Deleted

## 2016-08-29 ENCOUNTER — Other Ambulatory Visit (HOSPITAL_BASED_OUTPATIENT_CLINIC_OR_DEPARTMENT_OTHER): Payer: Medicare HMO

## 2016-08-29 ENCOUNTER — Encounter: Payer: Self-pay | Admitting: Hematology and Oncology

## 2016-08-29 ENCOUNTER — Ambulatory Visit (HOSPITAL_BASED_OUTPATIENT_CLINIC_OR_DEPARTMENT_OTHER): Payer: Medicare HMO | Admitting: Hematology and Oncology

## 2016-08-29 ENCOUNTER — Ambulatory Visit (HOSPITAL_BASED_OUTPATIENT_CLINIC_OR_DEPARTMENT_OTHER): Payer: Medicare HMO

## 2016-08-29 ENCOUNTER — Ambulatory Visit: Payer: Medicare HMO

## 2016-08-29 DIAGNOSIS — C773 Secondary and unspecified malignant neoplasm of axilla and upper limb lymph nodes: Secondary | ICD-10-CM | POA: Diagnosis not present

## 2016-08-29 DIAGNOSIS — Z170 Estrogen receptor positive status [ER+]: Principal | ICD-10-CM

## 2016-08-29 DIAGNOSIS — Z5111 Encounter for antineoplastic chemotherapy: Secondary | ICD-10-CM | POA: Diagnosis not present

## 2016-08-29 DIAGNOSIS — I509 Heart failure, unspecified: Secondary | ICD-10-CM

## 2016-08-29 DIAGNOSIS — C50412 Malignant neoplasm of upper-outer quadrant of left female breast: Secondary | ICD-10-CM | POA: Diagnosis not present

## 2016-08-29 DIAGNOSIS — Z95828 Presence of other vascular implants and grafts: Secondary | ICD-10-CM

## 2016-08-29 DIAGNOSIS — R197 Diarrhea, unspecified: Secondary | ICD-10-CM | POA: Diagnosis not present

## 2016-08-29 DIAGNOSIS — R530 Neoplastic (malignant) related fatigue: Secondary | ICD-10-CM | POA: Diagnosis not present

## 2016-08-29 LAB — COMPREHENSIVE METABOLIC PANEL
ALT: 67 U/L — AB (ref 0–55)
ANION GAP: 9 meq/L (ref 3–11)
AST: 49 U/L — ABNORMAL HIGH (ref 5–34)
Albumin: 3.3 g/dL — ABNORMAL LOW (ref 3.5–5.0)
Alkaline Phosphatase: 95 U/L (ref 40–150)
BILIRUBIN TOTAL: 0.34 mg/dL (ref 0.20–1.20)
BUN: 17.6 mg/dL (ref 7.0–26.0)
CALCIUM: 10.2 mg/dL (ref 8.4–10.4)
CHLORIDE: 104 meq/L (ref 98–109)
CO2: 26 mEq/L (ref 22–29)
CREATININE: 0.7 mg/dL (ref 0.6–1.1)
EGFR: 86 mL/min/{1.73_m2} — AB (ref >90)
Glucose: 126 mg/dl (ref 70–140)
Potassium: 4.1 mEq/L (ref 3.5–5.1)
Sodium: 138 mEq/L (ref 136–145)
Total Protein: 6.8 g/dL (ref 6.4–8.3)

## 2016-08-29 LAB — CBC WITH DIFFERENTIAL/PLATELET
BASO%: 0.2 % (ref 0.0–2.0)
BASOS ABS: 0 10*3/uL (ref 0.0–0.1)
EOS ABS: 0.1 10*3/uL (ref 0.0–0.5)
EOS%: 1.3 % (ref 0.0–7.0)
HEMATOCRIT: 40.8 % (ref 34.8–46.6)
HGB: 13.5 g/dL (ref 11.6–15.9)
LYMPH#: 1.6 10*3/uL (ref 0.9–3.3)
LYMPH%: 18.9 % (ref 14.0–49.7)
MCH: 29 pg (ref 25.1–34.0)
MCHC: 33.1 g/dL (ref 31.5–36.0)
MCV: 87.6 fL (ref 79.5–101.0)
MONO#: 0.4 10*3/uL (ref 0.1–0.9)
MONO%: 5 % (ref 0.0–14.0)
NEUT#: 6.2 10*3/uL (ref 1.5–6.5)
NEUT%: 74.6 % (ref 38.4–76.8)
PLATELETS: 275 10*3/uL (ref 145–400)
RBC: 4.66 10*6/uL (ref 3.70–5.45)
RDW: 16 % — ABNORMAL HIGH (ref 11.2–14.5)
WBC: 8.4 10*3/uL (ref 3.9–10.3)

## 2016-08-29 MED ORDER — FAMOTIDINE IN NACL 20-0.9 MG/50ML-% IV SOLN
INTRAVENOUS | Status: AC
  Filled 2016-08-29: qty 50

## 2016-08-29 MED ORDER — DIPHENOXYLATE-ATROPINE 2.5-0.025 MG PO TABS: 1.0000 | ORAL_TABLET | Freq: Four times a day (QID) | ORAL | 0 refills | Status: DC | PRN

## 2016-08-29 MED ORDER — HEPARIN SOD (PORK) LOCK FLUSH 100 UNIT/ML IV SOLN
500.0000 [IU] | Freq: Once | INTRAVENOUS | Status: AC | PRN
  Administered 2016-08-29: 500 [IU]
  Filled 2016-08-29: qty 5

## 2016-08-29 MED ORDER — FAMOTIDINE IN NACL 20-0.9 MG/50ML-% IV SOLN
20.0000 mg | Freq: Once | INTRAVENOUS | Status: AC
  Administered 2016-08-29: 20 mg via INTRAVENOUS

## 2016-08-29 MED ORDER — LORAZEPAM 0.5 MG PO TABS: 0.5000 mg | ORAL_TABLET | Freq: Four times a day (QID) | ORAL | 0 refills | Status: DC | PRN

## 2016-08-29 MED ORDER — SODIUM CHLORIDE 0.9% FLUSH
10.0000 mL | INTRAVENOUS | Status: DC | PRN
  Administered 2016-08-29: 10 mL
  Filled 2016-08-29: qty 10

## 2016-08-29 MED ORDER — DIPHENHYDRAMINE HCL 50 MG/ML IJ SOLN
INTRAMUSCULAR | Status: AC
  Filled 2016-08-29: qty 1

## 2016-08-29 MED ORDER — SODIUM CHLORIDE 0.9% FLUSH
10.0000 mL | INTRAVENOUS | Status: DC | PRN
  Administered 2016-08-29: 10 mL via INTRAVENOUS
  Filled 2016-08-29: qty 10

## 2016-08-29 MED ORDER — PACLITAXEL CHEMO INJECTION 300 MG/50ML
50.0000 mg/m2 | Freq: Once | INTRAVENOUS | Status: AC
  Administered 2016-08-29: 132 mg via INTRAVENOUS
  Filled 2016-08-29: qty 22

## 2016-08-29 MED ORDER — SODIUM CHLORIDE 0.9 % IV SOLN
Freq: Once | INTRAVENOUS | Status: AC
  Administered 2016-08-29: 13:00:00 via INTRAVENOUS

## 2016-08-29 MED ORDER — PROCHLORPERAZINE MALEATE 10 MG PO TABS: 10.0000 mg | ORAL_TABLET | Freq: Four times a day (QID) | ORAL | 3 refills | Status: DC | PRN

## 2016-08-29 MED ORDER — DIPHENHYDRAMINE HCL 50 MG/ML IJ SOLN
50.0000 mg | Freq: Once | INTRAMUSCULAR | Status: AC
  Administered 2016-08-29: 50 mg via INTRAVENOUS

## 2016-08-29 MED ORDER — SODIUM CHLORIDE 0.9 % IV SOLN
20.0000 mg | Freq: Once | INTRAVENOUS | Status: AC
  Administered 2016-08-29: 20 mg via INTRAVENOUS
  Filled 2016-08-29: qty 2

## 2016-08-29 MED FILL — PROCHLORPERAZINE 10 MG TAB: 10 | 7 days supply | Qty: 30 | Fill #0

## 2016-08-29 MED FILL — LORazepam 0.5 MG TABS: 0.5 | 9 days supply | Qty: 30 | Fill #0

## 2016-08-29 MED FILL — DIPHENOXYLATE/ATROPINE TAB: 2.5-0.025 | 15 days supply | Qty: 60 | Fill #0

## 2016-08-29 NOTE — Assessment & Plan Note (Signed)
06/21/16: Left lumpectomy: IDC grade 3, 1.1 cm, 1/5 LN Positive with ECE, Margins Neg, LVI Present; Er 30%, PR 0%, Ki 67 40%, Her 2 Positive Ratio 2.71; T1CN1 (Stage 2B)  Patient has multiple comorbidities including history of LVH with congestive heart failure  Treatment Plan: 1. consideration for adjuvant Taxol with Herceptin And Perjetaif her cardiologist allows [name] to treat her with anti-HER-2 therapy. (Last echocardiogram showed an ejection fraction of 55-60%) 2. Followed by adjuvant radiation 3. Followed by adjuvant antiestrogen therapy with anastrozole 1 mg daily 5 years (bone density 04/29/2016 T score -1.2) ----------------------------------------------------------------------------------------------------------------------------------------------------- Current Treatment: Cycle 6 weeklyTaxol;Herceptin and Perjeta Q 3 weeks cycle 3 to be given next week Chemotherapy toxicities: 1. Nausea vomiting during chemotherapy  Blood counts are reviewed and are adequate for treatment. 2. Intermittent loose stools up to 3 a day  Denies neuropathy   Return to clinic in 2 weeks for cycle 8 Taxol

## 2016-08-29 NOTE — Patient Instructions (Signed)
Cancer Center Discharge Instructions for Patients Receiving Chemotherapy  Today you received the following chemotherapy agents:  Taxol  To help prevent nausea and vomiting after your treatment, we encourage you to take your nausea medication as prescribed.   If you develop nausea and vomiting that is not controlled by your nausea medication, call the clinic.   BELOW ARE SYMPTOMS THAT SHOULD BE REPORTED IMMEDIATELY:  *FEVER GREATER THAN 100.5 F  *CHILLS WITH OR WITHOUT FEVER  NAUSEA AND VOMITING THAT IS NOT CONTROLLED WITH YOUR NAUSEA MEDICATION  *UNUSUAL SHORTNESS OF BREATH  *UNUSUAL BRUISING OR BLEEDING  TENDERNESS IN MOUTH AND THROAT WITH OR WITHOUT PRESENCE OF ULCERS  *URINARY PROBLEMS  *BOWEL PROBLEMS  UNUSUAL RASH Items with * indicate a potential emergency and should be followed up as soon as possible.  Feel free to call the clinic you have any questions or concerns. The clinic phone number is [phone].  Please show the CHEMO ALERT CARD at check-in to the Emergency Department and triage nurse.   

## 2016-08-29 NOTE — Progress Notes (Signed)
Patient Care Team: [name], MD as PCP - General (Family Medicine) Fanny Skates, MD as Consulting Physician (General Surgery) Nicholas Lose, MD as Consulting Physician (Hematology and Oncology) Gery Pray, MD as Consulting Physician (Radiation Oncology)  DIAGNOSIS:  Encounter Diagnosis  Name Primary?  . Malignant neoplasm of upper-outer quadrant of left breast in female, estrogen receptor positive ([place])     SUMMARY OF ONCOLOGIC HISTORY:   Malignant neoplasm of upper-outer quadrant of left female breast ([place])   05/13/2016 Initial Diagnosis    Left breast biopsy 3:00: IDC, grade 3, ER 30%, PR 0%, Ki-67 40%, HER-2 positive ratio 2.71, screening detected left breast mass 9 x 7 x 6 mm, axilla negative, T1 BN 0 stage IA clinical stage      06/21/2016 Surgery    Left lumpectomy: IDC grade 3, 1.1 cm, 1/5 LN Positive with ECE, Margins Neg, LVI Present; Er 30%, PR 0%, Ki 67 40%, Her 2 Positive Ratio 2.71; T1CN1 (Stage 2B)      07/25/2016 -  Chemotherapy    Taxol weekly 12; Herceptin and Perjeta every 3 weeks        CHIEF COMPLIANT: Taxol cycle 6, Herceptin and Perjeta be given next week  INTERVAL HISTORY: ILINA XU is a 66 year old with above-mentioned C left breast cancer treated with lumpectomy and is currently on adjuvant chemotherapy. She is currently on cycle 6 of Taxol. Herceptin Perjeta to be given every 3 weeks. She tolerated the chemotherapy well. She does have fatigue related to chemotherapy. Denies any nausea vomiting. Denies any neuropathy. She continues to have profound diarrhea multiple times over the week. She also has intermittent nausea vomiting.  REVIEW OF SYSTEMS:   Constitutional: Denies fevers, chills or abnormal weight loss Eyes: Denies blurriness of vision Ears, nose, mouth, throat, and face: Denies mucositis or sore throat Respiratory: Denies cough, dyspnea or wheezes Cardiovascular: Denies palpitation, chest discomfort Gastrointestinal:   Nausea vomiting and diarrhea Skin: Denies abnormal skin rashes Lymphatics: Denies new lymphadenopathy or easy bruising Neurological:Denies numbness, tingling or new weaknesses Behavioral/Psych: Mood is stable, no new changes  Extremities: No lower extremity edema Breast:  denies any pain or lumps or nodules in either breasts All other systems were reviewed with the patient and are negative.  I have reviewed the past medical history, past surgical history, social history and family history with the patient and they are unchanged from previous note.  ALLERGIES:  is allergic to no known allergies.  MEDICATIONS:  Current Outpatient Prescriptions  Medication Sig Dispense Refill  . acetaminophen (TYLENOL) 500 MG tablet Take 1,000 mg by mouth 2 (two) times daily as needed for moderate pain or headache.    . albuterol (PROVENTIL HFA;VENTOLIN HFA) 108 (90 Base) MCG/ACT inhaler Inhale 2 puffs into the lungs every 6 (six) hours as needed for wheezing or shortness of breath. 1 Inhaler 6  . aspirin EC 81 MG tablet Take 81 mg by mouth at bedtime.     . beclomethasone (QVAR) 80 MCG/ACT inhaler Inhale 1-2 puffs into the lungs 2 (two) times daily. (Patient taking differently: Inhale 1-2 puffs into the lungs 2 (two) times daily as needed (shortness of breath). ) 1 Inhaler 5  . clotrimazole-betamethasone (LOTRISONE) cream Apply 1 application topically 2 (two) times daily as needed (for rash). 30 g 0  . diclofenac (VOLTAREN) 75 MG EC tablet Take 1 tablet (75 mg total) by mouth 2 (two) times daily. 180 tablet 1  . diltiazem (CARDIZEM CD) 120 MG 24 hr capsule Take  1 capsule (120 mg total) by mouth daily. 90 capsule 2  . escitalopram (LEXAPRO) 20 MG tablet TAKE 1 TABLET ONE TIME DAILY 90 tablet 4  . esomeprazole (NEXIUM) 40 MG capsule Take 40 mg by mouth daily.     . fluticasone (FLONASE) 50 MCG/ACT nasal spray Place 1 spray into both nostrils daily as needed for allergies. (Patient taking differently: Place 1  spray into both nostrils daily as needed (FOR SINUS ISSUE). ) 48 g 1  . furosemide (LASIX) 20 MG tablet Take 1 tablet (20 mg total) by mouth daily. 90 tablet 2  . HYDROcodone-acetaminophen (NORCO) 5-325 MG tablet Take 1-2 tablets by mouth every 6 (six) hours as needed for moderate pain or severe pain. 30 tablet 0  . Hypromellose (ARTIFICIAL TEARS OP) Apply 1 drop to eye daily as needed (dry eyes).    Marland Kitchen lidocaine-prilocaine (EMLA) cream Apply to affected area once 30 g 3  . LORazepam (ATIVAN) 0.5 MG tablet Take 1 tablet (0.5 mg total) by mouth every 6 (six) hours as needed (Nausea or vomiting). 30 tablet 0  . losartan (COZAAR) 100 MG tablet Take 1 tablet (100 mg total) by mouth daily. 90 tablet 2  . montelukast (SINGULAIR) 10 MG tablet Take 1 tablet (10 mg total) by mouth at bedtime. 90 tablet 2  . nebivolol (BYSTOLIC) 10 MG tablet Take 1 tablet (10 mg total) by mouth at bedtime. 90 tablet 2  . ondansetron (ZOFRAN) 8 MG tablet Take 1 tablet (8 mg total) by mouth 2 (two) times daily as needed (Nausea or vomiting). 30 tablet 1  . prochlorperazine (COMPAZINE) 10 MG tablet Take 1 tablet (10 mg total) by mouth every 6 (six) hours as needed (Nausea or vomiting). 30 tablet 1  . rosuvastatin (CRESTOR) 40 MG tablet Take 1 tablet (40 mg total) by mouth daily. (Patient taking differently: Take 40 mg by mouth at bedtime. ) 90 tablet 3  . triamcinolone (KENALOG) 0.025 % ointment Apply 1 application topically 2 (two) times daily. 30 g 0   No current facility-administered medications for this visit.     PHYSICAL EXAMINATION: ECOG PERFORMANCE STATUS: 1 - Symptomatic but completely ambulatory  Vitals:   08/29/16 1148  BP: 134/86  Pulse: 79  Resp: 18  Temp: 97.9 F (36.6 C)   Filed Weights   08/29/16 1148  Weight: (!) 327 lb 1.6 oz (148.4 kg)    GENERAL:alert, no distress and comfortable SKIN: skin color, texture, turgor are normal, no rashes or significant lesions EYES: normal, Conjunctiva are pink  and non-injected, sclera clear OROPHARYNX:no exudate, no erythema and lips, buccal mucosa, and tongue normal  NECK: supple, thyroid normal size, non-tender, without nodularity LYMPH:  no palpable lymphadenopathy in the cervical, axillary or inguinal LUNGS: clear to auscultation and percussion with normal breathing effort HEART: regular rate & rhythm and no murmurs and no lower extremity edema ABDOMEN:abdomen soft, non-tender and normal bowel sounds MUSCULOSKELETAL:no cyanosis of digits and no clubbing  NEURO: alert & oriented x 3 with fluent speech, no focal motor/sensory deficits EXTREMITIES: No lower extremity edema  LABORATORY DATA:  I have reviewed the data as listed   Chemistry      Component Value Date/Time   NA 139 08/22/2016 0742   K 3.8 08/22/2016 0742   CL 104 07/16/2016 0716   CO2 24 08/22/2016 0742   BUN 16.4 08/22/2016 0742   CREATININE 0.8 08/22/2016 0742      Component Value Date/Time   CALCIUM 9.7 08/22/2016 0742  ALKPHOS 100 08/22/2016 0742   AST 41 (H) 08/22/2016 0742   ALT 65 (H) 08/22/2016 0742   BILITOT 0.43 08/22/2016 0742       Lab Results  Component Value Date   WBC 8.4 08/29/2016   HGB 13.5 08/29/2016   HCT 40.8 08/29/2016   MCV 87.6 08/29/2016   PLT 275 08/29/2016   NEUTROABS 6.2 08/29/2016    ASSESSMENT & PLAN:  Malignant neoplasm of upper-outer quadrant of left female breast ([place]) 06/21/16: Left lumpectomy: IDC grade 3, 1.1 cm, 1/5 LN Positive with ECE, Margins Neg, LVI Present; Er 30%, PR 0%, Ki 67 40%, Her 2 Positive Ratio 2.71; T1CN1 (Stage 2B)  Patient has multiple comorbidities including history of LVH with congestive heart failure  Treatment Plan: 1. consideration for adjuvant Taxol with Herceptin And Perjetaif her cardiologist allows [name] to treat her with anti-HER-2 therapy. (Last echocardiogram showed an ejection fraction of 55-60%) 2. Followed by adjuvant radiation 3. Followed by adjuvant antiestrogen therapy with anastrozole  1 mg daily 5 years (bone density 04/29/2016 T score -1.2) ----------------------------------------------------------------------------------------------------------------------------------------------------- Current Treatment: Cycle 6 weeklyTaxol;Herceptin and Perjeta Q 3 weeks cycle 3 to be given next week Chemotherapy toxicities: 1. Nausea vomiting during chemotherapy  Blood counts are reviewed and are adequate for treatment. 2. Intermittent loose stools up to 3 a day 3. Diarrhea: I sent a prescription for Lomotil. I also asked her to take probiotics. Denies neuropathy   Return to clinic in 2 weeks for cycle 8 Taxol   I spent 25 minutes talking to the patient of which more than half was spent in counseling and coordination of care.  No orders of the defined types were placed in this encounter.  The patient has a good understanding of the overall plan. she agrees with it. she will call with any problems that may develop before the next visit here.   Rulon Eisenmenger, MD 08/29/16

## 2016-09-05 ENCOUNTER — Ambulatory Visit: Payer: Medicare HMO

## 2016-09-05 ENCOUNTER — Ambulatory Visit (HOSPITAL_BASED_OUTPATIENT_CLINIC_OR_DEPARTMENT_OTHER): Payer: Medicare HMO

## 2016-09-05 ENCOUNTER — Other Ambulatory Visit (HOSPITAL_BASED_OUTPATIENT_CLINIC_OR_DEPARTMENT_OTHER): Payer: Medicare HMO

## 2016-09-05 VITALS — BP 118/73 | HR 66 | Temp 98.5°F | Resp 20

## 2016-09-05 DIAGNOSIS — Z5112 Encounter for antineoplastic immunotherapy: Secondary | ICD-10-CM

## 2016-09-05 DIAGNOSIS — Z95828 Presence of other vascular implants and grafts: Secondary | ICD-10-CM

## 2016-09-05 DIAGNOSIS — Z170 Estrogen receptor positive status [ER+]: Principal | ICD-10-CM

## 2016-09-05 DIAGNOSIS — C773 Secondary and unspecified malignant neoplasm of axilla and upper limb lymph nodes: Secondary | ICD-10-CM

## 2016-09-05 DIAGNOSIS — C50412 Malignant neoplasm of upper-outer quadrant of left female breast: Secondary | ICD-10-CM

## 2016-09-05 DIAGNOSIS — Z5111 Encounter for antineoplastic chemotherapy: Secondary | ICD-10-CM

## 2016-09-05 LAB — CBC WITH DIFFERENTIAL/PLATELET
BASO%: 0.2 % (ref 0.0–2.0)
BASOS ABS: 0 10*3/uL (ref 0.0–0.1)
EOS%: 1.1 % (ref 0.0–7.0)
Eosinophils Absolute: 0.1 10*3/uL (ref 0.0–0.5)
HCT: 40 % (ref 34.8–46.6)
HEMOGLOBIN: 12.9 g/dL (ref 11.6–15.9)
LYMPH%: 15.4 % (ref 14.0–49.7)
MCH: 28.7 pg (ref 25.1–34.0)
MCHC: 32.3 g/dL (ref 31.5–36.0)
MCV: 88.9 fL (ref 79.5–101.0)
MONO#: 0.5 10*3/uL (ref 0.1–0.9)
MONO%: 6 % (ref 0.0–14.0)
NEUT#: 6.5 10*3/uL (ref 1.5–6.5)
NEUT%: 77.3 % — ABNORMAL HIGH (ref 38.4–76.8)
Platelets: 252 10*3/uL (ref 145–400)
RBC: 4.5 10*6/uL (ref 3.70–5.45)
RDW: 16.7 % — AB (ref 11.2–14.5)
WBC: 8.4 10*3/uL (ref 3.9–10.3)
lymph#: 1.3 10*3/uL (ref 0.9–3.3)

## 2016-09-05 LAB — COMPREHENSIVE METABOLIC PANEL
ALT: 53 U/L (ref 0–55)
AST: 31 U/L (ref 5–34)
Albumin: 3.1 g/dL — ABNORMAL LOW (ref 3.5–5.0)
Alkaline Phosphatase: 99 U/L (ref 40–150)
Anion Gap: 12 mEq/L — ABNORMAL HIGH (ref 3–11)
BUN: 18.1 mg/dL (ref 7.0–26.0)
CHLORIDE: 105 meq/L (ref 98–109)
CO2: 22 meq/L (ref 22–29)
Calcium: 9.3 mg/dL (ref 8.4–10.4)
Creatinine: 0.8 mg/dL (ref 0.6–1.1)
EGFR: 76 mL/min/{1.73_m2} — ABNORMAL LOW (ref >90)
GLUCOSE: 244 mg/dL — AB (ref 70–140)
POTASSIUM: 3.7 meq/L (ref 3.5–5.1)
SODIUM: 139 meq/L (ref 136–145)
Total Bilirubin: 0.49 mg/dL (ref 0.20–1.20)
Total Protein: 6.7 g/dL (ref 6.4–8.3)

## 2016-09-05 MED ORDER — SODIUM CHLORIDE 0.9% FLUSH
10.0000 mL | INTRAVENOUS | Status: DC | PRN
  Administered 2016-09-05: 10 mL via INTRAVENOUS
  Filled 2016-09-05: qty 10

## 2016-09-05 MED ORDER — FAMOTIDINE IN NACL 20-0.9 MG/50ML-% IV SOLN
20.0000 mg | Freq: Once | INTRAVENOUS | Status: AC
Start: 2016-09-05 — End: 2016-09-05
  Administered 2016-09-05: 20 mg via INTRAVENOUS

## 2016-09-05 MED ORDER — TRASTUZUMAB CHEMO 150 MG IV SOLR
6.0000 mg/kg | Freq: Once | INTRAVENOUS | Status: AC
  Administered 2016-09-05: 882 mg via INTRAVENOUS
  Filled 2016-09-05: qty 42

## 2016-09-05 MED ORDER — SODIUM CHLORIDE 0.9% FLUSH
10.0000 mL | INTRAVENOUS | Status: DC | PRN
  Administered 2016-09-05: 10 mL
  Filled 2016-09-05: qty 10

## 2016-09-05 MED ORDER — SODIUM CHLORIDE 0.9 % IV SOLN
Freq: Once | INTRAVENOUS | Status: AC
  Administered 2016-09-05: 09:00:00 via INTRAVENOUS

## 2016-09-05 MED ORDER — DIPHENHYDRAMINE HCL 50 MG/ML IJ SOLN
INTRAMUSCULAR | Status: AC
  Filled 2016-09-05: qty 1

## 2016-09-05 MED ORDER — DIPHENHYDRAMINE HCL 25 MG PO CAPS: 50.0000 mg | ORAL_CAPSULE | Freq: Once | ORAL | Status: DC

## 2016-09-05 MED ORDER — FAMOTIDINE IN NACL 20-0.9 MG/50ML-% IV SOLN
INTRAVENOUS | Status: AC
  Filled 2016-09-05: qty 50

## 2016-09-05 MED ORDER — ACETAMINOPHEN 325 MG PO TABS
650.0000 mg | ORAL_TABLET | Freq: Once | ORAL | Status: AC
  Administered 2016-09-05: 650 mg via ORAL

## 2016-09-05 MED ORDER — DEXAMETHASONE SODIUM PHOSPHATE 100 MG/10ML IJ SOLN
20.0000 mg | Freq: Once | INTRAMUSCULAR | Status: AC
  Administered 2016-09-05: 20 mg via INTRAVENOUS
  Filled 2016-09-05: qty 2

## 2016-09-05 MED ORDER — HEPARIN SOD (PORK) LOCK FLUSH 100 UNIT/ML IV SOLN
500.0000 [IU] | Freq: Once | INTRAVENOUS | Status: AC | PRN
  Administered 2016-09-05: 500 [IU]
  Filled 2016-09-05: qty 5

## 2016-09-05 MED ORDER — SODIUM CHLORIDE 0.9 % IV SOLN
50.0000 mg/m2 | Freq: Once | INTRAVENOUS | Status: AC
  Administered 2016-09-05: 132 mg via INTRAVENOUS
  Filled 2016-09-05: qty 22

## 2016-09-05 MED ORDER — PERTUZUMAB CHEMO INJECTION 420 MG/14ML
420.0000 mg | Freq: Once | INTRAVENOUS | Status: AC
  Administered 2016-09-05: 420 mg via INTRAVENOUS
  Filled 2016-09-05: qty 14

## 2016-09-05 MED ORDER — ACETAMINOPHEN 325 MG PO TABS
ORAL_TABLET | ORAL | Status: AC
  Filled 2016-09-05: qty 2

## 2016-09-05 MED ORDER — DIPHENHYDRAMINE HCL 50 MG/ML IJ SOLN
50.0000 mg | Freq: Once | INTRAMUSCULAR | Status: AC
  Administered 2016-09-05: 50 mg via INTRAVENOUS

## 2016-09-05 MED ORDER — HEPARIN SOD (PORK) LOCK FLUSH 100 UNIT/ML IV SOLN
500.0000 [IU] | Freq: Once | INTRAVENOUS | Status: DC | PRN
  Filled 2016-09-05: qty 5

## 2016-09-05 NOTE — Progress Notes (Signed)
Ok to treat with catheter tip placement in lower right atrium per Dr. Lindi Adie.

## 2016-09-05 NOTE — Patient Instructions (Signed)
[facility] Discharge Instructions for Patients Receiving Chemotherapy  Today you received the following chemotherapy agents:  Herceptin, Perjeta, Taxol  To help prevent nausea and vomiting after your treatment, we encourage you to take your nausea medication as prescribed.   If you develop nausea and vomiting that is not controlled by your nausea medication, call the clinic.   BELOW ARE SYMPTOMS THAT SHOULD BE REPORTED IMMEDIATELY:  *FEVER GREATER THAN 100.5 F  *CHILLS WITH OR WITHOUT FEVER  NAUSEA AND VOMITING THAT IS NOT CONTROLLED WITH YOUR NAUSEA MEDICATION  *UNUSUAL SHORTNESS OF BREATH  *UNUSUAL BRUISING OR BLEEDING  TENDERNESS IN MOUTH AND THROAT WITH OR WITHOUT PRESENCE OF ULCERS  *URINARY PROBLEMS  *BOWEL PROBLEMS  UNUSUAL RASH Items with * indicate a potential emergency and should be followed up as soon as possible.  Feel free to call the clinic you have any questions or concerns. The clinic phone number is [phone].  Please show the [facility] at check-in to the Emergency Department and triage nurse.

## 2016-09-05 NOTE — Progress Notes (Signed)
Per Dr. Lindi Adie, ok to treat with echo from 12/6.

## 2016-09-05 NOTE — Patient Instructions (Signed)
Implanted Port Home Guide An implanted port is a type of central line that is placed under the skin. Central lines are used to provide IV access when treatment or nutrition needs to be given through a person's veins. Implanted ports are used for long-term IV access. An implanted port may be placed because:  You need IV medicine that would be irritating to the small veins in your hands or arms.  You need long-term IV medicines, such as antibiotics.  You need IV nutrition for a long period.  You need frequent blood draws for lab tests.  You need dialysis.  Implanted ports are usually placed in the chest area, but they can also be placed in the upper arm, the abdomen, or the leg. An implanted port has two main parts:  Reservoir. The reservoir is round and will appear as a small, raised area under your skin. The reservoir is the part where a needle is inserted to give medicines or draw blood.  Catheter. The catheter is a thin, flexible tube that extends from the reservoir. The catheter is placed into a large vein. Medicine that is inserted into the reservoir goes into the catheter and then into the vein.  How will I care for my incision site? Do not get the incision site wet. Bathe or shower as directed by your health care provider. How is my port accessed? Special steps must be taken to access the port:  Before the port is accessed, a numbing cream can be placed on the skin. This helps numb the skin over the port site.  Your health care provider uses a sterile technique to access the port. ? Your health care provider must put on a mask and sterile gloves. ? The skin over your port is cleaned carefully with an antiseptic and allowed to dry. ? The port is gently pinched between sterile gloves, and a needle is inserted into the port.  Only "non-coring" port needles should be used to access the port. Once the port is accessed, a blood return should be checked. This helps ensure that the port  is in the vein and is not clogged.  If your port needs to remain accessed for a constant infusion, a clear (transparent) bandage will be placed over the needle site. The bandage and needle will need to be changed every week, or as directed by your health care provider.  Keep the bandage covering the needle clean and dry. Do not get it wet. Follow your health care provider's instructions on how to take a shower or bath while the port is accessed.  If your port does not need to stay accessed, no bandage is needed over the port.  What is flushing? Flushing helps keep the port from getting clogged. Follow your health care provider's instructions on how and when to flush the port. Ports are usually flushed with saline solution or a medicine called heparin. The need for flushing will depend on how the port is used.  If the port is used for intermittent medicines or blood draws, the port will need to be flushed: ? After medicines have been given. ? After blood has been drawn. ? As part of routine maintenance.  If a constant infusion is running, the port may not need to be flushed.  How long will my port stay implanted? The port can stay in for as long as your health care provider thinks it is needed. When it is time for the port to come out, surgery will be   done to remove it. The procedure is similar to the one performed when the port was put in. When should I seek immediate medical care? When you have an implanted port, you should seek immediate medical care if:  You notice a bad smell coming from the incision site.  You have swelling, redness, or drainage at the incision site.  You have more swelling or pain at the port site or the surrounding area.  You have a fever that is not controlled with medicine.  This information is not intended to replace advice given to you by your health care provider. Make sure you discuss any questions you have with your health care provider. Document  Released: 06/10/2005 Document Revised: 11/16/2015 Document Reviewed: 02/15/2013 Elsevier Interactive Patient Education  2017 [employer]  

## 2016-09-10 ENCOUNTER — Other Ambulatory Visit: Payer: Self-pay

## 2016-09-10 MED ORDER — AZITHROMYCIN 250 MG PO TABS: ORAL_TABLET | ORAL | 0 refills | Status: DC

## 2016-09-10 NOTE — Progress Notes (Signed)
Pt called regarding having sinus infection. Pt states that she gets this around this time of year. Pt is scheduled for chemo next week and would like to inquire Dr.Gudena about getting an antibiotic prescription. Pt states that she has had this for 1 week and has green mucos drainage. Pt complains of facial pain and tenderness around her sinus area and around her upper gums and teeth. Denies any fevers, chills, sob or chest pain at this time. Discussed with Dr.Gudena and obtained ZPAK antibiotic for pt.Sent medication to [facility]. Called pt to confirm and verbalized understanding.

## 2016-09-12 ENCOUNTER — Ambulatory Visit: Payer: Medicare HMO

## 2016-09-12 ENCOUNTER — Other Ambulatory Visit (HOSPITAL_BASED_OUTPATIENT_CLINIC_OR_DEPARTMENT_OTHER): Payer: Medicare HMO

## 2016-09-12 ENCOUNTER — Ambulatory Visit (HOSPITAL_BASED_OUTPATIENT_CLINIC_OR_DEPARTMENT_OTHER): Payer: Medicare HMO

## 2016-09-12 ENCOUNTER — Ambulatory Visit (HOSPITAL_BASED_OUTPATIENT_CLINIC_OR_DEPARTMENT_OTHER): Payer: Medicare HMO | Admitting: Adult Health

## 2016-09-12 ENCOUNTER — Encounter: Payer: Self-pay | Admitting: Adult Health

## 2016-09-12 VITALS — BP 151/83 | HR 65 | Temp 98.6°F | Resp 16

## 2016-09-12 DIAGNOSIS — C50411 Malignant neoplasm of upper-outer quadrant of right female breast: Secondary | ICD-10-CM

## 2016-09-12 DIAGNOSIS — Z95828 Presence of other vascular implants and grafts: Secondary | ICD-10-CM

## 2016-09-12 DIAGNOSIS — Z5111 Encounter for antineoplastic chemotherapy: Secondary | ICD-10-CM | POA: Diagnosis not present

## 2016-09-12 DIAGNOSIS — J3489 Other specified disorders of nose and nasal sinuses: Secondary | ICD-10-CM | POA: Diagnosis not present

## 2016-09-12 DIAGNOSIS — C50412 Malignant neoplasm of upper-outer quadrant of left female breast: Secondary | ICD-10-CM | POA: Diagnosis not present

## 2016-09-12 DIAGNOSIS — Z170 Estrogen receptor positive status [ER+]: Principal | ICD-10-CM

## 2016-09-12 LAB — COMPREHENSIVE METABOLIC PANEL
ALBUMIN: 3.1 g/dL — AB (ref 3.5–5.0)
ALK PHOS: 98 U/L (ref 40–150)
ALT: 59 U/L — ABNORMAL HIGH (ref 0–55)
ANION GAP: 12 meq/L — AB (ref 3–11)
AST: 33 U/L (ref 5–34)
BILIRUBIN TOTAL: 0.43 mg/dL (ref 0.20–1.20)
BUN: 17.1 mg/dL (ref 7.0–26.0)
CALCIUM: 10 mg/dL (ref 8.4–10.4)
CO2: 22 mEq/L (ref 22–29)
Chloride: 107 mEq/L (ref 98–109)
Creatinine: 0.9 mg/dL (ref 0.6–1.1)
EGFR: 67 mL/min/{1.73_m2} — AB (ref >90)
GLUCOSE: 168 mg/dL — AB (ref 70–140)
Potassium: 3.5 mEq/L (ref 3.5–5.1)
SODIUM: 142 meq/L (ref 136–145)
TOTAL PROTEIN: 6.7 g/dL (ref 6.4–8.3)

## 2016-09-12 LAB — CBC WITH DIFFERENTIAL/PLATELET
BASO%: 0.3 % (ref 0.0–2.0)
Basophils Absolute: 0 10*3/uL (ref 0.0–0.1)
EOS ABS: 0.1 10*3/uL (ref 0.0–0.5)
EOS%: 1.2 % (ref 0.0–7.0)
HCT: 40.1 % (ref 34.8–46.6)
HEMOGLOBIN: 12.9 g/dL (ref 11.6–15.9)
LYMPH%: 19.8 % (ref 14.0–49.7)
MCH: 28.5 pg (ref 25.1–34.0)
MCHC: 32.2 g/dL (ref 31.5–36.0)
MCV: 88.7 fL (ref 79.5–101.0)
MONO#: 0.5 10*3/uL (ref 0.1–0.9)
MONO%: 6.5 % (ref 0.0–14.0)
NEUT%: 72.2 % (ref 38.4–76.8)
NEUTROS ABS: 5.7 10*3/uL (ref 1.5–6.5)
PLATELETS: 292 10*3/uL (ref 145–400)
RBC: 4.52 10*6/uL (ref 3.70–5.45)
RDW: 17 % — AB (ref 11.2–14.5)
WBC: 7.8 10*3/uL (ref 3.9–10.3)
lymph#: 1.6 10*3/uL (ref 0.9–3.3)

## 2016-09-12 MED ORDER — SODIUM CHLORIDE 0.9% FLUSH
10.0000 mL | INTRAVENOUS | Status: DC | PRN
  Administered 2016-09-12: 10 mL via INTRAVENOUS
  Filled 2016-09-12: qty 10

## 2016-09-12 MED ORDER — SODIUM CHLORIDE 0.9 % IV SOLN
Freq: Once | INTRAVENOUS | Status: AC
  Administered 2016-09-12: 09:00:00 via INTRAVENOUS

## 2016-09-12 MED ORDER — SODIUM CHLORIDE 0.9 % IV SOLN
20.0000 mg | Freq: Once | INTRAVENOUS | Status: AC
  Administered 2016-09-12: 20 mg via INTRAVENOUS
  Filled 2016-09-12: qty 2

## 2016-09-12 MED ORDER — DIPHENHYDRAMINE HCL 50 MG/ML IJ SOLN
INTRAMUSCULAR | Status: AC
Start: 2016-09-12 — End: 2016-09-12
  Filled 2016-09-12: qty 1

## 2016-09-12 MED ORDER — HEPARIN SOD (PORK) LOCK FLUSH 100 UNIT/ML IV SOLN
500.0000 [IU] | Freq: Once | INTRAVENOUS | Status: AC | PRN
  Administered 2016-09-12: 500 [IU]
  Filled 2016-09-12: qty 5

## 2016-09-12 MED ORDER — DIPHENHYDRAMINE HCL 50 MG/ML IJ SOLN
50.0000 mg | Freq: Once | INTRAMUSCULAR | Status: AC
  Administered 2016-09-12: 50 mg via INTRAVENOUS

## 2016-09-12 MED ORDER — PACLITAXEL CHEMO INJECTION 300 MG/50ML
50.0000 mg/m2 | Freq: Once | INTRAVENOUS | Status: AC
  Administered 2016-09-12: 132 mg via INTRAVENOUS
  Filled 2016-09-12: qty 22

## 2016-09-12 MED ORDER — FAMOTIDINE IN NACL 20-0.9 MG/50ML-% IV SOLN
20.0000 mg | Freq: Once | INTRAVENOUS | Status: AC
  Administered 2016-09-12: 20 mg via INTRAVENOUS

## 2016-09-12 MED ORDER — FAMOTIDINE IN NACL 20-0.9 MG/50ML-% IV SOLN
INTRAVENOUS | Status: AC
  Filled 2016-09-12: qty 50

## 2016-09-12 MED ORDER — SODIUM CHLORIDE 0.9% FLUSH
10.0000 mL | INTRAVENOUS | Status: DC | PRN
  Administered 2016-09-12: 10 mL
  Filled 2016-09-12: qty 10

## 2016-09-12 NOTE — Patient Instructions (Signed)
[facility] Discharge Instructions for Patients Receiving Chemotherapy  Today you received the following chemotherapy agents Taxol   To help prevent nausea and vomiting after your treatment, we encourage you to take your nausea medication as directed.   If you develop nausea and vomiting that is not controlled by your nausea medication, call the clinic.   BELOW ARE SYMPTOMS THAT SHOULD BE REPORTED IMMEDIATELY:  *FEVER GREATER THAN 100.5 F  *CHILLS WITH OR WITHOUT FEVER  NAUSEA AND VOMITING THAT IS NOT CONTROLLED WITH YOUR NAUSEA MEDICATION  *UNUSUAL SHORTNESS OF BREATH  *UNUSUAL BRUISING OR BLEEDING  TENDERNESS IN MOUTH AND THROAT WITH OR WITHOUT PRESENCE OF ULCERS  *URINARY PROBLEMS  *BOWEL PROBLEMS  UNUSUAL RASH Items with * indicate a potential emergency and should be followed up as soon as possible.  Feel free to call the clinic you have any questions or concerns. The clinic phone number is [phone].  Please show the CHEMO ALERT CARD at check-in to the Emergency Department and triage nurse.   

## 2016-09-12 NOTE — Patient Instructions (Signed)
Implanted Port Home Guide An implanted port is a type of central line that is placed under the skin. Central lines are used to provide IV access when treatment or nutrition needs to be given through a person's veins. Implanted ports are used for long-term IV access. An implanted port may be placed because:  You need IV medicine that would be irritating to the small veins in your hands or arms.  You need long-term IV medicines, such as antibiotics.  You need IV nutrition for a long period.  You need frequent blood draws for lab tests.  You need dialysis.  Implanted ports are usually placed in the chest area, but they can also be placed in the upper arm, the abdomen, or the leg. An implanted port has two main parts:  Reservoir. The reservoir is round and will appear as a small, raised area under your skin. The reservoir is the part where a needle is inserted to give medicines or draw blood.  Catheter. The catheter is a thin, flexible tube that extends from the reservoir. The catheter is placed into a large vein. Medicine that is inserted into the reservoir goes into the catheter and then into the vein.  How will I care for my incision site? Do not get the incision site wet. Bathe or shower as directed by your health care provider. How is my port accessed? Special steps must be taken to access the port:  Before the port is accessed, a numbing cream can be placed on the skin. This helps numb the skin over the port site.  Your health care provider uses a sterile technique to access the port. ? Your health care provider must put on a mask and sterile gloves. ? The skin over your port is cleaned carefully with an antiseptic and allowed to dry. ? The port is gently pinched between sterile gloves, and a needle is inserted into the port.  Only "non-coring" port needles should be used to access the port. Once the port is accessed, a blood return should be checked. This helps ensure that the port  is in the vein and is not clogged.  If your port needs to remain accessed for a constant infusion, a clear (transparent) bandage will be placed over the needle site. The bandage and needle will need to be changed every week, or as directed by your health care provider.  Keep the bandage covering the needle clean and dry. Do not get it wet. Follow your health care provider's instructions on how to take a shower or bath while the port is accessed.  If your port does not need to stay accessed, no bandage is needed over the port.  What is flushing? Flushing helps keep the port from getting clogged. Follow your health care provider's instructions on how and when to flush the port. Ports are usually flushed with saline solution or a medicine called heparin. The need for flushing will depend on how the port is used.  If the port is used for intermittent medicines or blood draws, the port will need to be flushed: ? After medicines have been given. ? After blood has been drawn. ? As part of routine maintenance.  If a constant infusion is running, the port may not need to be flushed.  How long will my port stay implanted? The port can stay in for as long as your health care provider thinks it is needed. When it is time for the port to come out, surgery will be   done to remove it. The procedure is similar to the one performed when the port was put in. When should I seek immediate medical care? When you have an implanted port, you should seek immediate medical care if:  You notice a bad smell coming from the incision site.  You have swelling, redness, or drainage at the incision site.  You have more swelling or pain at the port site or the surrounding area.  You have a fever that is not controlled with medicine.  This information is not intended to replace advice given to you by your health care provider. Make sure you discuss any questions you have with your health care provider. Document  Released: 06/10/2005 Document Revised: 11/16/2015 Document Reviewed: 02/15/2013 Elsevier Interactive Patient Education  2017 [employer]  

## 2016-09-12 NOTE — Progress Notes (Signed)
Patient Care Team: [name], MD as PCP - General (Family Medicine) Fanny Skates, MD as Consulting Physician (General Surgery) Nicholas Lose, MD as Consulting Physician (Hematology and Oncology) Gery Pray, MD as Consulting Physician (Radiation Oncology) Gardenia Phlegm, NP as Nurse Practitioner (Hematology and Oncology)  DIAGNOSIS:  No diagnosis found.  SUMMARY OF ONCOLOGIC HISTORY:   Malignant neoplasm of upper-outer quadrant of left female breast ([place])   05/13/2016 Initial Diagnosis    Left breast biopsy 3:00: IDC, grade 3, ER 30%, PR 0%, Ki-67 40%, HER-2 positive ratio 2.71, screening detected left breast mass 9 x 7 x 6 mm, axilla negative, T1 BN 0 stage IA clinical stage      06/21/2016 Surgery    Left lumpectomy: IDC grade 3, 1.1 cm, 1/5 LN Positive with ECE, Margins Neg, LVI Present; Er 30%, PR 0%, Ki 67 40%, Her 2 Positive Ratio 2.71; T1CN1 (Stage 2B)      07/25/2016 -  Chemotherapy    Taxol weekly 12; Herceptin and Perjeta every 3 weeks        CHIEF COMPLIANT: Taxol cycle 6, Herceptin and Perjeta be given next week  INTERVAL HISTORY: [name] is a 66 year old woman who is currently undergoing adjuvant weekly Taxol, along with Herceptin and Perjeta every 3 weeks for her stage IIB Her2 positive breast cancer.  Today she will receive cycle 8 of her weekly Taxol as well as the Herceptin and Perjeta.  She is tolerating these treatments well.  She does not c/o any peripheral neuropathy.  She did take Lomotil for diarrhea and her diarrhea resolved.  She did have sinusitis and completed a course of antibiotics.  She does have some residual sinus pain/pressure from her sinusitis.  She denies any cardiac issues and her next echocardiogram is on 09/24/2016.   Otherwise she is well and without any questions or concerns.    REVIEW OF SYSTEMS:   Review of Systems  Constitutional: Negative for chills, fever and malaise/fatigue.  HENT: Positive for sinus pain.  Negative for hearing loss and tinnitus.   Eyes: Negative for blurred vision and double vision.  Respiratory: Negative for cough and shortness of breath.   Cardiovascular: Negative for chest pain, palpitations and leg swelling.  Gastrointestinal: Positive for diarrhea (x 1). Negative for abdominal pain, blood in stool, constipation, heartburn, nausea and vomiting.  Genitourinary: Negative for dysuria.  Neurological: Negative for dizziness, tingling and headaches.     I have reviewed the past medical history, past surgical history, social history and family history with the patient and they are unchanged from previous note.  ALLERGIES:  is allergic to no known allergies.  MEDICATIONS:  Current Outpatient Prescriptions  Medication Sig Dispense Refill  . acetaminophen (TYLENOL) 500 MG tablet Take 1,000 mg by mouth 2 (two) times daily as needed for moderate pain or headache.    . albuterol (PROVENTIL HFA;VENTOLIN HFA) 108 (90 Base) MCG/ACT inhaler Inhale 2 puffs into the lungs every 6 (six) hours as needed for wheezing or shortness of breath. 1 Inhaler 6  . aspirin EC 81 MG tablet Take 81 mg by mouth at bedtime.     Marland Kitchen azithromycin (ZITHROMAX Z-PAK) 250 MG tablet As directed 6 each 0  . beclomethasone (QVAR) 80 MCG/ACT inhaler Inhale 1-2 puffs into the lungs 2 (two) times daily. (Patient taking differently: Inhale 1-2 puffs into the lungs 2 (two) times daily as needed (shortness of breath). ) 1 Inhaler 5  . clotrimazole-betamethasone (LOTRISONE) cream Apply 1 application topically  2 (two) times daily as needed (for rash). 30 g 0  . diclofenac (VOLTAREN) 75 MG EC tablet Take 1 tablet (75 mg total) by mouth 2 (two) times daily. 180 tablet 1  . diltiazem (CARDIZEM CD) 120 MG 24 hr capsule Take 1 capsule (120 mg total) by mouth daily. 90 capsule 2  . diphenoxylate-atropine (LOMOTIL) 2.5-0.025 MG tablet Take 1 tablet by mouth 4 (four) times daily as needed for diarrhea or loose stools. 60 tablet 0  .  escitalopram (LEXAPRO) 20 MG tablet TAKE 1 TABLET ONE TIME DAILY 90 tablet 4  . esomeprazole (NEXIUM) 40 MG capsule Take 40 mg by mouth daily.     . fluticasone (FLONASE) 50 MCG/ACT nasal spray Place 1 spray into both nostrils daily as needed for allergies. (Patient taking differently: Place 1 spray into both nostrils daily as needed (FOR SINUS ISSUE). ) 48 g 1  . furosemide (LASIX) 20 MG tablet Take 1 tablet (20 mg total) by mouth daily. 90 tablet 2  . HYDROcodone-acetaminophen (NORCO) 5-325 MG tablet Take 1-2 tablets by mouth every 6 (six) hours as needed for moderate pain or severe pain. 30 tablet 0  . Hypromellose (ARTIFICIAL TEARS OP) Apply 1 drop to eye daily as needed (dry eyes).    Marland Kitchen lidocaine-prilocaine (EMLA) cream Apply to affected area once 30 g 3  . LORazepam (ATIVAN) 0.5 MG tablet Take 1 tablet (0.5 mg total) by mouth every 6 (six) hours as needed (Nausea or vomiting). 30 tablet 0  . losartan (COZAAR) 100 MG tablet Take 1 tablet (100 mg total) by mouth daily. 90 tablet 2  . montelukast (SINGULAIR) 10 MG tablet Take 1 tablet (10 mg total) by mouth at bedtime. 90 tablet 2  . nebivolol (BYSTOLIC) 10 MG tablet Take 1 tablet (10 mg total) by mouth at bedtime. 90 tablet 2  . ondansetron (ZOFRAN) 8 MG tablet Take 1 tablet (8 mg total) by mouth 2 (two) times daily as needed (Nausea or vomiting). 30 tablet 1  . prochlorperazine (COMPAZINE) 10 MG tablet Take 1 tablet (10 mg total) by mouth every 6 (six) hours as needed (Nausea or vomiting). 30 tablet 3  . rosuvastatin (CRESTOR) 40 MG tablet Take 1 tablet (40 mg total) by mouth daily. (Patient taking differently: Take 40 mg by mouth at bedtime. ) 90 tablet 3  . triamcinolone (KENALOG) 0.025 % ointment Apply 1 application topically 2 (two) times daily. 30 g 0   No current facility-administered medications for this visit.    Facility-Administered Medications Ordered in Other Visits  Medication Dose Route Frequency Provider Last Rate Last Dose  .  sodium chloride flush (NS) 0.9 % injection 10 mL  10 mL Intracatheter PRN Nicholas Lose, MD   10 mL at 09/12/16 1049    PHYSICAL EXAMINATION: ECOG PERFORMANCE STATUS: 1 - Symptomatic but completely ambulatory Pt seen in infusion, please see their copy of VS There were no vitals filed for this visit. There were no vitals filed for this visit.  GENERAL: Patient is a well appearing female in no acute distress HEENT:  Sclerae anicteric.  Oropharynx clear and moist. No ulcerations or evidence of oropharyngeal candidiasis. Neck is supple.  NODES:  No cervical, supraclavicular, or axillary lymphadenopathy palpated.  BREAST EXAM:  Deferred. LUNGS:  Clear to auscultation bilaterally.  No wheezes or rhonchi. HEART:  Regular rate and rhythm. No murmur appreciated. ABDOMEN:  Soft, nontender.  Positive, normoactive bowel sounds. No organomegaly palpated. MSK:  No focal spinal tenderness to palpation. Full  range of motion bilaterally in the upper extremities. EXTREMITIES:  No peripheral edema.   SKIN:  Clear with no obvious rashes or skin changes. No nail dyscrasia. NEURO:  Nonfocal. Well oriented.  Appropriate affect.    LABORATORY DATA:  I have reviewed the data as listed   Chemistry      Component Value Date/Time   NA 142 09/12/2016 0759   K 3.5 09/12/2016 0759   CL 104 07/16/2016 0716   CO2 22 09/12/2016 0759   BUN 17.1 09/12/2016 0759   CREATININE 0.9 09/12/2016 0759      Component Value Date/Time   CALCIUM 10.0 09/12/2016 0759   ALKPHOS 98 09/12/2016 0759   AST 33 09/12/2016 0759   ALT 59 (H) 09/12/2016 0759   BILITOT 0.43 09/12/2016 0759       Lab Results  Component Value Date   WBC 7.8 09/12/2016   HGB 12.9 09/12/2016   HCT 40.1 09/12/2016   MCV 88.7 09/12/2016   PLT 292 09/12/2016   NEUTROABS 5.7 09/12/2016    ASSESSMENT & PLAN:  Malignant neoplasm of upper-outer quadrant of left female breast ([place]) 06/21/16: Left lumpectomy: IDC grade 3, 1.1 cm, 1/5 LN Positive with  ECE, Margins Neg, LVI Present; Er 30%, PR 0%, Ki 67 40%, Her 2 Positive Ratio 2.71; T1CN1 (Stage 2B)  Patient has multiple comorbidities including history of LVH with congestive heart failure  Treatment Plan: 1. consideration for adjuvant Taxol with Herceptin And Perjetaif her cardiologist allows [name] to treat her with anti-HER-2 therapy. (Last echocardiogram showed an ejection fraction of 55-60%--due for repeat on 09/24/2016) 2. Followed by adjuvant radiation 3. Followed by adjuvant antiestrogen therapy with anastrozole 1 mg daily 5 years (bone density 04/29/2016 T score -1.2) ----------------------------------------------------------------------------------------------------------------------------------------------------- Current Treatment: Cycle 8 weeklyTaxol;Herceptin and Perjeta Q 3 weeks cycle 3 due today Chemotherapy toxicities: Patient is doing well today.  Her labs are normal and she will proceed with treatment.  Her diarrhea is resolved after taking lomotil once that Dr. Lindi Adie prescribed.  We will monitor her sinus symptoms, as she may need a prednisone taper if she continues to have sinus pressure.    She will return next week for labs and taxol, I requested further future appointments with [name] for April, 2018.  She will need to be scheduled with radiation oncology soon.     The patient has a good understanding of the overall plan. she agrees with it. she will call with any problems that may develop before the next visit here.  A total of (30) minutes of face-to-face time was spent with this patient with greater than 50% of that time in counseling and care-coordination.    Scot Dock, NP 09/12/16

## 2016-09-19 ENCOUNTER — Ambulatory Visit: Payer: Medicare HMO

## 2016-09-19 ENCOUNTER — Ambulatory Visit (HOSPITAL_BASED_OUTPATIENT_CLINIC_OR_DEPARTMENT_OTHER): Payer: Medicare HMO

## 2016-09-19 ENCOUNTER — Encounter: Payer: Self-pay | Admitting: *Deleted

## 2016-09-19 ENCOUNTER — Other Ambulatory Visit (HOSPITAL_BASED_OUTPATIENT_CLINIC_OR_DEPARTMENT_OTHER): Payer: Medicare HMO

## 2016-09-19 VITALS — BP 156/91 | HR 78 | Temp 98.6°F | Resp 20

## 2016-09-19 DIAGNOSIS — Z5111 Encounter for antineoplastic chemotherapy: Secondary | ICD-10-CM

## 2016-09-19 DIAGNOSIS — Z170 Estrogen receptor positive status [ER+]: Principal | ICD-10-CM

## 2016-09-19 DIAGNOSIS — C50412 Malignant neoplasm of upper-outer quadrant of left female breast: Secondary | ICD-10-CM | POA: Diagnosis not present

## 2016-09-19 DIAGNOSIS — C773 Secondary and unspecified malignant neoplasm of axilla and upper limb lymph nodes: Secondary | ICD-10-CM | POA: Diagnosis not present

## 2016-09-19 LAB — COMPREHENSIVE METABOLIC PANEL
ALBUMIN: 3.3 g/dL — AB (ref 3.5–5.0)
ALT: 68 U/L — ABNORMAL HIGH (ref 0–55)
ANION GAP: 13 meq/L — AB (ref 3–11)
AST: 40 U/L — AB (ref 5–34)
Alkaline Phosphatase: 91 U/L (ref 40–150)
BUN: 25.4 mg/dL (ref 7.0–26.0)
CALCIUM: 9.9 mg/dL (ref 8.4–10.4)
CO2: 22 mEq/L (ref 22–29)
CREATININE: 0.9 mg/dL (ref 0.6–1.1)
Chloride: 104 mEq/L (ref 98–109)
EGFR: 63 mL/min/{1.73_m2} — ABNORMAL LOW (ref >90)
Glucose: 220 mg/dl — ABNORMAL HIGH (ref 70–140)
Potassium: 4 mEq/L (ref 3.5–5.1)
Sodium: 139 mEq/L (ref 136–145)
Total Bilirubin: 0.36 mg/dL (ref 0.20–1.20)
Total Protein: 6.8 g/dL (ref 6.4–8.3)

## 2016-09-19 LAB — CBC WITH DIFFERENTIAL/PLATELET
BASO%: 0.9 % (ref 0.0–2.0)
BASOS ABS: 0.1 10*3/uL (ref 0.0–0.1)
EOS%: 0.6 % (ref 0.0–7.0)
Eosinophils Absolute: 0.1 10*3/uL (ref 0.0–0.5)
HEMATOCRIT: 40 % (ref 34.8–46.6)
HEMOGLOBIN: 13.3 g/dL (ref 11.6–15.9)
LYMPH#: 2.1 10*3/uL (ref 0.9–3.3)
LYMPH%: 24 % (ref 14.0–49.7)
MCH: 29.4 pg (ref 25.1–34.0)
MCHC: 33.2 g/dL (ref 31.5–36.0)
MCV: 88.4 fL (ref 79.5–101.0)
MONO#: 0.5 10*3/uL (ref 0.1–0.9)
MONO%: 5.8 % (ref 0.0–14.0)
NEUT#: 6 10*3/uL (ref 1.5–6.5)
NEUT%: 68.7 % (ref 38.4–76.8)
PLATELETS: 304 10*3/uL (ref 145–400)
RBC: 4.52 10*6/uL (ref 3.70–5.45)
RDW: 18.6 % — AB (ref 11.2–14.5)
WBC: 8.8 10*3/uL (ref 3.9–10.3)

## 2016-09-19 MED ORDER — SODIUM CHLORIDE 0.9 % IV SOLN
Freq: Once | INTRAVENOUS | Status: AC
  Administered 2016-09-19: 09:00:00 via INTRAVENOUS

## 2016-09-19 MED ORDER — DIPHENHYDRAMINE HCL 50 MG/ML IJ SOLN
50.0000 mg | Freq: Once | INTRAMUSCULAR | Status: AC
  Administered 2016-09-19: 50 mg via INTRAVENOUS

## 2016-09-19 MED ORDER — DIPHENHYDRAMINE HCL 50 MG/ML IJ SOLN
INTRAMUSCULAR | Status: AC
  Filled 2016-09-19: qty 1

## 2016-09-19 MED ORDER — HEPARIN SOD (PORK) LOCK FLUSH 100 UNIT/ML IV SOLN
500.0000 [IU] | Freq: Once | INTRAVENOUS | Status: AC | PRN
  Administered 2016-09-19: 500 [IU]
  Filled 2016-09-19: qty 5

## 2016-09-19 MED ORDER — FAMOTIDINE IN NACL 20-0.9 MG/50ML-% IV SOLN
20.0000 mg | Freq: Once | INTRAVENOUS | Status: AC
  Administered 2016-09-19: 20 mg via INTRAVENOUS

## 2016-09-19 MED ORDER — FAMOTIDINE IN NACL 20-0.9 MG/50ML-% IV SOLN
INTRAVENOUS | Status: AC
  Filled 2016-09-19: qty 50

## 2016-09-19 MED ORDER — SODIUM CHLORIDE 0.9 % IV SOLN
20.0000 mg | Freq: Once | INTRAVENOUS | Status: AC
  Administered 2016-09-19: 20 mg via INTRAVENOUS
  Filled 2016-09-19: qty 2

## 2016-09-19 MED ORDER — PACLITAXEL CHEMO INJECTION 300 MG/50ML
50.0000 mg/m2 | Freq: Once | INTRAVENOUS | Status: AC
  Administered 2016-09-19: 132 mg via INTRAVENOUS
  Filled 2016-09-19: qty 22

## 2016-09-19 MED ORDER — SODIUM CHLORIDE 0.9% FLUSH
10.0000 mL | INTRAVENOUS | Status: DC | PRN
  Administered 2016-09-19: 10 mL
  Filled 2016-09-19: qty 10

## 2016-09-19 NOTE — Patient Instructions (Signed)
[facility] Discharge Instructions for Patients Receiving Chemotherapy  Today you received the following chemotherapy agents Taxol   To help prevent nausea and vomiting after your treatment, we encourage you to take your nausea medication as directed.   If you develop nausea and vomiting that is not controlled by your nausea medication, call the clinic.   BELOW ARE SYMPTOMS THAT SHOULD BE REPORTED IMMEDIATELY:  *FEVER GREATER THAN 100.5 F  *CHILLS WITH OR WITHOUT FEVER  NAUSEA AND VOMITING THAT IS NOT CONTROLLED WITH YOUR NAUSEA MEDICATION  *UNUSUAL SHORTNESS OF BREATH  *UNUSUAL BRUISING OR BLEEDING  TENDERNESS IN MOUTH AND THROAT WITH OR WITHOUT PRESENCE OF ULCERS  *URINARY PROBLEMS  *BOWEL PROBLEMS  UNUSUAL RASH Items with * indicate a potential emergency and should be followed up as soon as possible.  Feel free to call the clinic you have any questions or concerns. The clinic phone number is [phone].  Please show the CHEMO ALERT CARD at check-in to the Emergency Department and triage nurse.   

## 2016-09-19 NOTE — Patient Instructions (Signed)
Implanted Port Home Guide An implanted port is a type of central line that is placed under the skin. Central lines are used to provide IV access when treatment or nutrition needs to be given through a person's veins. Implanted ports are used for long-term IV access. An implanted port may be placed because:  You need IV medicine that would be irritating to the small veins in your hands or arms.  You need long-term IV medicines, such as antibiotics.  You need IV nutrition for a long period.  You need frequent blood draws for lab tests.  You need dialysis.  Implanted ports are usually placed in the chest area, but they can also be placed in the upper arm, the abdomen, or the leg. An implanted port has two main parts:  Reservoir. The reservoir is round and will appear as a small, raised area under your skin. The reservoir is the part where a needle is inserted to give medicines or draw blood.  Catheter. The catheter is a thin, flexible tube that extends from the reservoir. The catheter is placed into a large vein. Medicine that is inserted into the reservoir goes into the catheter and then into the vein.  How will I care for my incision site? Do not get the incision site wet. Bathe or shower as directed by your health care provider. How is my port accessed? Special steps must be taken to access the port:  Before the port is accessed, a numbing cream can be placed on the skin. This helps numb the skin over the port site.  Your health care provider uses a sterile technique to access the port. ? Your health care provider must put on a mask and sterile gloves. ? The skin over your port is cleaned carefully with an antiseptic and allowed to dry. ? The port is gently pinched between sterile gloves, and a needle is inserted into the port.  Only "non-coring" port needles should be used to access the port. Once the port is accessed, a blood return should be checked. This helps ensure that the port  is in the vein and is not clogged.  If your port needs to remain accessed for a constant infusion, a clear (transparent) bandage will be placed over the needle site. The bandage and needle will need to be changed every week, or as directed by your health care provider.  Keep the bandage covering the needle clean and dry. Do not get it wet. Follow your health care provider's instructions on how to take a shower or bath while the port is accessed.  If your port does not need to stay accessed, no bandage is needed over the port.  What is flushing? Flushing helps keep the port from getting clogged. Follow your health care provider's instructions on how and when to flush the port. Ports are usually flushed with saline solution or a medicine called heparin. The need for flushing will depend on how the port is used.  If the port is used for intermittent medicines or blood draws, the port will need to be flushed: ? After medicines have been given. ? After blood has been drawn. ? As part of routine maintenance.  If a constant infusion is running, the port may not need to be flushed.  How long will my port stay implanted? The port can stay in for as long as your health care provider thinks it is needed. When it is time for the port to come out, surgery will be   done to remove it. The procedure is similar to the one performed when the port was put in. When should I seek immediate medical care? When you have an implanted port, you should seek immediate medical care if:  You notice a bad smell coming from the incision site.  You have swelling, redness, or drainage at the incision site.  You have more swelling or pain at the port site or the surrounding area.  You have a fever that is not controlled with medicine.  This information is not intended to replace advice given to you by your health care provider. Make sure you discuss any questions you have with your health care provider. Document  Released: 06/10/2005 Document Revised: 11/16/2015 Document Reviewed: 02/15/2013 Elsevier Interactive Patient Education  2017 [employer]  

## 2016-09-24 ENCOUNTER — Encounter: Payer: Self-pay | Admitting: Cardiology

## 2016-09-24 ENCOUNTER — Ambulatory Visit (HOSPITAL_COMMUNITY): Payer: Medicare HMO | Attending: Cardiology

## 2016-09-24 ENCOUNTER — Other Ambulatory Visit: Payer: Self-pay

## 2016-09-24 DIAGNOSIS — Z6841 Body Mass Index (BMI) 40.0 and over, adult: Secondary | ICD-10-CM | POA: Insufficient documentation

## 2016-09-24 DIAGNOSIS — Z8249 Family history of ischemic heart disease and other diseases of the circulatory system: Secondary | ICD-10-CM | POA: Diagnosis not present

## 2016-09-24 DIAGNOSIS — I77810 Thoracic aortic ectasia: Secondary | ICD-10-CM | POA: Insufficient documentation

## 2016-09-24 DIAGNOSIS — I5032 Chronic diastolic (congestive) heart failure: Secondary | ICD-10-CM | POA: Diagnosis not present

## 2016-09-24 DIAGNOSIS — E669 Obesity, unspecified: Secondary | ICD-10-CM | POA: Insufficient documentation

## 2016-09-24 DIAGNOSIS — C50912 Malignant neoplasm of unspecified site of left female breast: Secondary | ICD-10-CM | POA: Diagnosis not present

## 2016-09-24 DIAGNOSIS — E785 Hyperlipidemia, unspecified: Secondary | ICD-10-CM | POA: Insufficient documentation

## 2016-09-24 DIAGNOSIS — I77819 Aortic ectasia, unspecified site: Secondary | ICD-10-CM | POA: Insufficient documentation

## 2016-09-24 DIAGNOSIS — G4733 Obstructive sleep apnea (adult) (pediatric): Secondary | ICD-10-CM | POA: Diagnosis not present

## 2016-09-24 DIAGNOSIS — I110 Hypertensive heart disease with heart failure: Secondary | ICD-10-CM | POA: Insufficient documentation

## 2016-09-26 ENCOUNTER — Ambulatory Visit (HOSPITAL_BASED_OUTPATIENT_CLINIC_OR_DEPARTMENT_OTHER): Payer: Medicare HMO

## 2016-09-26 ENCOUNTER — Other Ambulatory Visit (HOSPITAL_BASED_OUTPATIENT_CLINIC_OR_DEPARTMENT_OTHER): Payer: Medicare HMO

## 2016-09-26 ENCOUNTER — Ambulatory Visit: Payer: Medicare HMO

## 2016-09-26 VITALS — BP 135/79 | HR 79 | Temp 98.6°F | Resp 20

## 2016-09-26 DIAGNOSIS — Z95828 Presence of other vascular implants and grafts: Secondary | ICD-10-CM

## 2016-09-26 DIAGNOSIS — Z170 Estrogen receptor positive status [ER+]: Principal | ICD-10-CM

## 2016-09-26 DIAGNOSIS — C50412 Malignant neoplasm of upper-outer quadrant of left female breast: Secondary | ICD-10-CM | POA: Diagnosis not present

## 2016-09-26 DIAGNOSIS — C773 Secondary and unspecified malignant neoplasm of axilla and upper limb lymph nodes: Secondary | ICD-10-CM | POA: Diagnosis not present

## 2016-09-26 DIAGNOSIS — Z5111 Encounter for antineoplastic chemotherapy: Secondary | ICD-10-CM

## 2016-09-26 DIAGNOSIS — Z5112 Encounter for antineoplastic immunotherapy: Secondary | ICD-10-CM

## 2016-09-26 LAB — CBC WITH DIFFERENTIAL/PLATELET
BASO%: 0.6 % (ref 0.0–2.0)
BASOS ABS: 0 10*3/uL (ref 0.0–0.1)
EOS ABS: 0 10*3/uL (ref 0.0–0.5)
EOS%: 0.5 % (ref 0.0–7.0)
HEMATOCRIT: 39.1 % (ref 34.8–46.6)
HGB: 13.1 g/dL (ref 11.6–15.9)
LYMPH%: 24.1 % (ref 14.0–49.7)
MCH: 29.6 pg (ref 25.1–34.0)
MCHC: 33.6 g/dL (ref 31.5–36.0)
MCV: 88.3 fL (ref 79.5–101.0)
MONO#: 0.3 10*3/uL (ref 0.1–0.9)
MONO%: 4.4 % (ref 0.0–14.0)
NEUT%: 70.4 % (ref 38.4–76.8)
NEUTROS ABS: 5 10*3/uL (ref 1.5–6.5)
PLATELETS: 272 10*3/uL (ref 145–400)
RBC: 4.43 10*6/uL (ref 3.70–5.45)
RDW: 19.6 % — ABNORMAL HIGH (ref 11.2–14.5)
WBC: 7.1 10*3/uL (ref 3.9–10.3)
lymph#: 1.7 10*3/uL (ref 0.9–3.3)

## 2016-09-26 LAB — COMPREHENSIVE METABOLIC PANEL
ALT: 64 U/L — ABNORMAL HIGH (ref 0–55)
ANION GAP: 10 meq/L (ref 3–11)
AST: 47 U/L — ABNORMAL HIGH (ref 5–34)
Albumin: 3.3 g/dL — ABNORMAL LOW (ref 3.5–5.0)
Alkaline Phosphatase: 90 U/L (ref 40–150)
BUN: 14.1 mg/dL (ref 7.0–26.0)
CALCIUM: 9.5 mg/dL (ref 8.4–10.4)
CHLORIDE: 106 meq/L (ref 98–109)
CO2: 25 meq/L (ref 22–29)
CREATININE: 0.7 mg/dL (ref 0.6–1.1)
EGFR: 85 mL/min/{1.73_m2} — AB (ref >90)
Glucose: 173 mg/dl — ABNORMAL HIGH (ref 70–140)
POTASSIUM: 4 meq/L (ref 3.5–5.1)
Sodium: 142 mEq/L (ref 136–145)
Total Bilirubin: 0.45 mg/dL (ref 0.20–1.20)
Total Protein: 6.6 g/dL (ref 6.4–8.3)

## 2016-09-26 MED ORDER — SODIUM CHLORIDE 0.9% FLUSH
10.0000 mL | INTRAVENOUS | Status: DC | PRN
  Administered 2016-09-26: 10 mL
  Filled 2016-09-26: qty 10

## 2016-09-26 MED ORDER — ACETAMINOPHEN 325 MG PO TABS
ORAL_TABLET | ORAL | Status: AC
  Filled 2016-09-26: qty 2

## 2016-09-26 MED ORDER — DEXAMETHASONE SODIUM PHOSPHATE 100 MG/10ML IJ SOLN
20.0000 mg | Freq: Once | INTRAMUSCULAR | Status: AC
  Administered 2016-09-26: 20 mg via INTRAVENOUS
  Filled 2016-09-26: qty 2

## 2016-09-26 MED ORDER — SODIUM CHLORIDE 0.9% FLUSH
10.0000 mL | INTRAVENOUS | Status: DC | PRN
  Administered 2016-09-26: 10 mL via INTRAVENOUS
  Filled 2016-09-26: qty 10

## 2016-09-26 MED ORDER — HEPARIN SOD (PORK) LOCK FLUSH 100 UNIT/ML IV SOLN
500.0000 [IU] | Freq: Once | INTRAVENOUS | Status: AC | PRN
Start: 2016-09-26 — End: 2016-09-26
  Administered 2016-09-26: 500 [IU]
  Filled 2016-09-26: qty 5

## 2016-09-26 MED ORDER — TRASTUZUMAB CHEMO 150 MG IV SOLR
6.0000 mg/kg | Freq: Once | INTRAVENOUS | Status: AC
  Administered 2016-09-26: 882 mg via INTRAVENOUS
  Filled 2016-09-26: qty 42

## 2016-09-26 MED ORDER — DIPHENHYDRAMINE HCL 25 MG PO CAPS: 50.0000 mg | ORAL_CAPSULE | Freq: Once | ORAL | Status: DC

## 2016-09-26 MED ORDER — PACLITAXEL CHEMO INJECTION 300 MG/50ML
50.0000 mg/m2 | Freq: Once | INTRAVENOUS | Status: AC
  Administered 2016-09-26: 132 mg via INTRAVENOUS
  Filled 2016-09-26: qty 22

## 2016-09-26 MED ORDER — ACETAMINOPHEN 325 MG PO TABS
650.0000 mg | ORAL_TABLET | Freq: Once | ORAL | Status: AC
  Administered 2016-09-26: 650 mg via ORAL

## 2016-09-26 MED ORDER — SODIUM CHLORIDE 0.9 % IV SOLN
420.0000 mg | Freq: Once | INTRAVENOUS | Status: AC
  Administered 2016-09-26: 420 mg via INTRAVENOUS
  Filled 2016-09-26: qty 14

## 2016-09-26 MED ORDER — FAMOTIDINE IN NACL 20-0.9 MG/50ML-% IV SOLN
INTRAVENOUS | Status: AC
  Filled 2016-09-26: qty 50

## 2016-09-26 MED ORDER — FAMOTIDINE IN NACL 20-0.9 MG/50ML-% IV SOLN
20.0000 mg | Freq: Once | INTRAVENOUS | Status: AC
  Administered 2016-09-26: 20 mg via INTRAVENOUS

## 2016-09-26 MED ORDER — SODIUM CHLORIDE 0.9 % IV SOLN
Freq: Once | INTRAVENOUS | Status: AC
  Administered 2016-09-26: 14:00:00 via INTRAVENOUS

## 2016-09-26 MED ORDER — DIPHENHYDRAMINE HCL 50 MG/ML IJ SOLN
INTRAMUSCULAR | Status: AC
  Filled 2016-09-26: qty 1

## 2016-09-26 MED ORDER — DIPHENHYDRAMINE HCL 50 MG/ML IJ SOLN
INTRAMUSCULAR | Status: AC
Start: 2016-09-26 — End: 2016-09-26
  Filled 2016-09-26: qty 1

## 2016-09-26 MED ORDER — DIPHENHYDRAMINE HCL 50 MG/ML IJ SOLN
50.0000 mg | Freq: Once | INTRAMUSCULAR | Status: AC
  Administered 2016-09-26: 50 mg via INTRAVENOUS

## 2016-09-26 NOTE — Patient Instructions (Signed)
[facility] Discharge Instructions for Patients Receiving Chemotherapy  Today you received the following chemotherapy agents: Taxol, Herceptin and Perjeta  To help prevent nausea and vomiting after your treatment, we encourage you to take your nausea medication as directed.    If you develop nausea and vomiting that is not controlled by your nausea medication, call the clinic.   BELOW ARE SYMPTOMS THAT SHOULD BE REPORTED IMMEDIATELY:  *FEVER GREATER THAN 100.5 F  *CHILLS WITH OR WITHOUT FEVER  NAUSEA AND VOMITING THAT IS NOT CONTROLLED WITH YOUR NAUSEA MEDICATION  *UNUSUAL SHORTNESS OF BREATH  *UNUSUAL BRUISING OR BLEEDING  TENDERNESS IN MOUTH AND THROAT WITH OR WITHOUT PRESENCE OF ULCERS  *URINARY PROBLEMS  *BOWEL PROBLEMS  UNUSUAL RASH Items with * indicate a potential emergency and should be followed up as soon as possible.  Feel free to call the clinic you have any questions or concerns. The clinic phone number is [phone].  Please show the [facility] at check-in to the Emergency Department and triage nurse.

## 2016-09-30 ENCOUNTER — Encounter: Payer: Self-pay | Admitting: *Deleted

## 2016-10-01 ENCOUNTER — Telehealth: Payer: Self-pay

## 2016-10-01 DIAGNOSIS — I77810 Thoracic aortic ectasia: Secondary | ICD-10-CM

## 2016-10-01 NOTE — Telephone Encounter (Signed)
-----   Message from Sueanne Margarita, MD sent at 09/24/2016  7:39 PM EDT ----- Echo showed normal LVF with moderate LVH, increased stiffness of heart muscle, mildly dilated ascending aorta, mild LAE - repeat echo in 1 year to follow aorta

## 2016-10-01 NOTE — Telephone Encounter (Signed)
Informed patient of results and verbal understanding expressed.  Repeat ECHO ordered to be scheduled in 1 year. Patient agrees with treatment plan. 

## 2016-10-03 ENCOUNTER — Ambulatory Visit (HOSPITAL_BASED_OUTPATIENT_CLINIC_OR_DEPARTMENT_OTHER): Payer: Medicare HMO

## 2016-10-03 ENCOUNTER — Other Ambulatory Visit: Payer: Medicare HMO

## 2016-10-03 ENCOUNTER — Ambulatory Visit: Payer: Medicare HMO

## 2016-10-03 VITALS — BP 141/87 | HR 77 | Temp 98.4°F | Resp 20

## 2016-10-03 DIAGNOSIS — Z95828 Presence of other vascular implants and grafts: Secondary | ICD-10-CM

## 2016-10-03 DIAGNOSIS — C50412 Malignant neoplasm of upper-outer quadrant of left female breast: Secondary | ICD-10-CM | POA: Diagnosis not present

## 2016-10-03 DIAGNOSIS — Z5111 Encounter for antineoplastic chemotherapy: Secondary | ICD-10-CM

## 2016-10-03 DIAGNOSIS — C773 Secondary and unspecified malignant neoplasm of axilla and upper limb lymph nodes: Secondary | ICD-10-CM | POA: Diagnosis not present

## 2016-10-03 DIAGNOSIS — Z170 Estrogen receptor positive status [ER+]: Principal | ICD-10-CM

## 2016-10-03 LAB — CBC WITH DIFFERENTIAL/PLATELET
BASO%: 0.5 % (ref 0.0–2.0)
BASOS ABS: 0 10*3/uL (ref 0.0–0.1)
EOS%: 0.8 % (ref 0.0–7.0)
Eosinophils Absolute: 0.1 10*3/uL (ref 0.0–0.5)
HCT: 39.6 % (ref 34.8–46.6)
HGB: 13.2 g/dL (ref 11.6–15.9)
LYMPH%: 21 % (ref 14.0–49.7)
MCH: 30 pg (ref 25.1–34.0)
MCHC: 33.4 g/dL (ref 31.5–36.0)
MCV: 89.8 fL (ref 79.5–101.0)
MONO#: 0.4 10*3/uL (ref 0.1–0.9)
MONO%: 5.5 % (ref 0.0–14.0)
NEUT#: 5.7 10*3/uL (ref 1.5–6.5)
NEUT%: 72.2 % (ref 38.4–76.8)
Platelets: 270 10*3/uL (ref 145–400)
RBC: 4.41 10*6/uL (ref 3.70–5.45)
RDW: 20.7 % — ABNORMAL HIGH (ref 11.2–14.5)
WBC: 7.9 10*3/uL (ref 3.9–10.3)
lymph#: 1.6 10*3/uL (ref 0.9–3.3)

## 2016-10-03 LAB — COMPREHENSIVE METABOLIC PANEL
ALT: 70 U/L — AB (ref 0–55)
ANION GAP: 13 meq/L — AB (ref 3–11)
AST: 48 U/L — AB (ref 5–34)
Albumin: 3.3 g/dL — ABNORMAL LOW (ref 3.5–5.0)
Alkaline Phosphatase: 85 U/L (ref 40–150)
BUN: 16.1 mg/dL (ref 7.0–26.0)
CHLORIDE: 106 meq/L (ref 98–109)
CO2: 25 meq/L (ref 22–29)
CREATININE: 0.8 mg/dL (ref 0.6–1.1)
Calcium: 9.8 mg/dL (ref 8.4–10.4)
EGFR: 78 mL/min/{1.73_m2} — ABNORMAL LOW (ref >90)
Glucose: 169 mg/dl — ABNORMAL HIGH (ref 70–140)
POTASSIUM: 3.9 meq/L (ref 3.5–5.1)
Sodium: 143 mEq/L (ref 136–145)
Total Bilirubin: 0.56 mg/dL (ref 0.20–1.20)
Total Protein: 6.5 g/dL (ref 6.4–8.3)

## 2016-10-03 MED ORDER — FAMOTIDINE IN NACL 20-0.9 MG/50ML-% IV SOLN
20.0000 mg | Freq: Once | INTRAVENOUS | Status: AC
Start: 2016-10-03 — End: 2016-10-03
  Administered 2016-10-03: 20 mg via INTRAVENOUS

## 2016-10-03 MED ORDER — SODIUM CHLORIDE 0.9 % IV SOLN
20.0000 mg | Freq: Once | INTRAVENOUS | Status: AC
  Administered 2016-10-03: 20 mg via INTRAVENOUS
  Filled 2016-10-03: qty 2

## 2016-10-03 MED ORDER — HEPARIN SOD (PORK) LOCK FLUSH 100 UNIT/ML IV SOLN
500.0000 [IU] | Freq: Once | INTRAVENOUS | Status: AC | PRN
  Administered 2016-10-03: 500 [IU]
  Filled 2016-10-03: qty 5

## 2016-10-03 MED ORDER — FAMOTIDINE IN NACL 20-0.9 MG/50ML-% IV SOLN
INTRAVENOUS | Status: AC
  Filled 2016-10-03: qty 50

## 2016-10-03 MED ORDER — SODIUM CHLORIDE 0.9 % IV SOLN
Freq: Once | INTRAVENOUS | Status: AC
  Administered 2016-10-03: 09:00:00 via INTRAVENOUS

## 2016-10-03 MED ORDER — DIPHENHYDRAMINE HCL 50 MG/ML IJ SOLN
50.0000 mg | Freq: Once | INTRAMUSCULAR | Status: AC
  Administered 2016-10-03: 50 mg via INTRAVENOUS

## 2016-10-03 MED ORDER — SODIUM CHLORIDE 0.9% FLUSH
10.0000 mL | INTRAVENOUS | Status: DC | PRN
  Administered 2016-10-03: 10 mL
  Filled 2016-10-03: qty 10

## 2016-10-03 MED ORDER — SODIUM CHLORIDE 0.9 % IV SOLN
50.0000 mg/m2 | Freq: Once | INTRAVENOUS | Status: AC
  Administered 2016-10-03: 132 mg via INTRAVENOUS
  Filled 2016-10-03: qty 22

## 2016-10-03 MED ORDER — DIPHENHYDRAMINE HCL 50 MG/ML IJ SOLN
INTRAMUSCULAR | Status: AC
  Filled 2016-10-03: qty 1

## 2016-10-03 MED ORDER — SODIUM CHLORIDE 0.9% FLUSH
10.0000 mL | INTRAVENOUS | Status: DC | PRN
  Administered 2016-10-03: 10 mL via INTRAVENOUS
  Filled 2016-10-03: qty 10

## 2016-10-03 NOTE — Patient Instructions (Signed)
[facility] Discharge Instructions for Patients Receiving Chemotherapy  Today you received the following chemotherapy agents Taxol   To help prevent nausea and vomiting after your treatment, we encourage you to take your nausea medication as directed.   If you develop nausea and vomiting that is not controlled by your nausea medication, call the clinic.   BELOW ARE SYMPTOMS THAT SHOULD BE REPORTED IMMEDIATELY:  *FEVER GREATER THAN 100.5 F  *CHILLS WITH OR WITHOUT FEVER  NAUSEA AND VOMITING THAT IS NOT CONTROLLED WITH YOUR NAUSEA MEDICATION  *UNUSUAL SHORTNESS OF BREATH  *UNUSUAL BRUISING OR BLEEDING  TENDERNESS IN MOUTH AND THROAT WITH OR WITHOUT PRESENCE OF ULCERS  *URINARY PROBLEMS  *BOWEL PROBLEMS  UNUSUAL RASH Items with * indicate a potential emergency and should be followed up as soon as possible.  Feel free to call the clinic you have any questions or concerns. The clinic phone number is [phone].  Please show the CHEMO ALERT CARD at check-in to the Emergency Department and triage nurse.   

## 2016-10-08 ENCOUNTER — Encounter: Payer: Self-pay | Admitting: Family Medicine

## 2016-10-08 ENCOUNTER — Ambulatory Visit (INDEPENDENT_AMBULATORY_CARE_PROVIDER_SITE_OTHER): Payer: Medicare HMO | Admitting: Family Medicine

## 2016-10-08 VITALS — BP 134/92 | HR 87 | Temp 98.1°F | Resp 16 | Ht 65.0 in | Wt 331.0 lb

## 2016-10-08 DIAGNOSIS — C50412 Malignant neoplasm of upper-outer quadrant of left female breast: Secondary | ICD-10-CM

## 2016-10-08 DIAGNOSIS — E6609 Other obesity due to excess calories: Secondary | ICD-10-CM

## 2016-10-08 DIAGNOSIS — Z170 Estrogen receptor positive status [ER+]: Secondary | ICD-10-CM

## 2016-10-08 DIAGNOSIS — F419 Anxiety disorder, unspecified: Secondary | ICD-10-CM

## 2016-10-08 DIAGNOSIS — R739 Hyperglycemia, unspecified: Secondary | ICD-10-CM | POA: Diagnosis not present

## 2016-10-08 DIAGNOSIS — R7989 Other specified abnormal findings of blood chemistry: Secondary | ICD-10-CM | POA: Diagnosis not present

## 2016-10-08 DIAGNOSIS — G4733 Obstructive sleep apnea (adult) (pediatric): Secondary | ICD-10-CM | POA: Diagnosis not present

## 2016-10-08 DIAGNOSIS — R945 Abnormal results of liver function studies: Secondary | ICD-10-CM

## 2016-10-08 DIAGNOSIS — I10 Essential (primary) hypertension: Secondary | ICD-10-CM | POA: Diagnosis not present

## 2016-10-08 DIAGNOSIS — G4700 Insomnia, unspecified: Secondary | ICD-10-CM | POA: Diagnosis not present

## 2016-10-08 DIAGNOSIS — E7800 Pure hypercholesterolemia, unspecified: Secondary | ICD-10-CM

## 2016-10-08 DIAGNOSIS — F32A Depression, unspecified: Secondary | ICD-10-CM

## 2016-10-08 DIAGNOSIS — F329 Major depressive disorder, single episode, unspecified: Secondary | ICD-10-CM

## 2016-10-08 HISTORY — DX: Insomnia, unspecified: G47.00

## 2016-10-08 LAB — HEMOGLOBIN A1C: Hgb A1c MFr Bld: 7.8 % — ABNORMAL HIGH (ref 4.6–6.5)

## 2016-10-08 LAB — LIPID PANEL
CHOL/HDL RATIO: 6
CHOLESTEROL: 213 mg/dL — AB (ref 0–200)
HDL: 35.4 mg/dL — AB (ref >39.00)

## 2016-10-08 LAB — LDL CHOLESTEROL, DIRECT: LDL DIRECT: 108 mg/dL

## 2016-10-08 MED ORDER — ESCITALOPRAM OXALATE 20 MG PO TABS: 20.0000 mg | ORAL_TABLET | Freq: Every day | ORAL | 1 refills | Status: DC

## 2016-10-08 MED ORDER — CEFDINIR 300 MG PO CAPS: 300.0000 mg | ORAL_CAPSULE | Freq: Two times a day (BID) | ORAL | 0 refills | Status: AC

## 2016-10-08 MED ORDER — DILTIAZEM HCL ER COATED BEADS 120 MG PO CP24: 120.0000 mg | ORAL_CAPSULE | Freq: Every day | ORAL | 1 refills | Status: DC

## 2016-10-08 MED ORDER — LOSARTAN POTASSIUM 100 MG PO TABS
100.0000 mg | ORAL_TABLET | Freq: Every day | ORAL | 3 refills | Status: DC
Start: 2016-10-08 — End: 2016-12-24

## 2016-10-08 MED ORDER — MONTELUKAST SODIUM 10 MG PO TABS: 10.0000 mg | ORAL_TABLET | Freq: Every day | ORAL | 1 refills | Status: DC

## 2016-10-08 MED ORDER — NEBIVOLOL HCL 10 MG PO TABS: 10.0000 mg | ORAL_TABLET | Freq: Every day | ORAL | 1 refills | Status: DC

## 2016-10-08 MED ORDER — DICLOFENAC SODIUM 75 MG PO TBEC: 75.0000 mg | DELAYED_RELEASE_TABLET | Freq: Two times a day (BID) | ORAL | 1 refills | Status: DC

## 2016-10-08 MED ORDER — FUROSEMIDE 20 MG PO TABS
20.0000 mg | ORAL_TABLET | Freq: Every day | ORAL | 1 refills | Status: DC
Start: 2016-10-08 — End: 2016-12-24

## 2016-10-08 MED FILL — CEFDINIR 300 MG CAPSULE: 300 | 10 days supply | Qty: 20 | Fill #0

## 2016-10-08 NOTE — Progress Notes (Signed)
[name], female   DOB: 1950-07-07, 66 y.o.   MRN: [id number]   Subjective:    [name], female    DOB: 1951/02/04, 66 y.o.   MRN: [id number]  No chief complaint on file.   HPI Patient is in today for Follow-up on a recent diagnosis of breast cancer, hypertension, CHF, obesity, sleep apnea. She has been undergoing chemotherapy regularly with Dr. Payton Mccallum. Her initial treatment was very difficult with lots of nausea but with pretreatment including steroids her symptoms are improved. She does note intermittent trouble with diarrhea but is responsive to Lomotil. She does note with each dose of steroid she does not sleep for nearly 48 hours but otherwise she tolerates fine. She continues to use her CPAP regularly but unfortunately notes a lot of trouble with her breathing since starting chemotherapy. She notes increased nasal congestion and produces green to bloody sputum from her nose at times. She also notes wheezing and tightness in her chest at the moment specimen lying down and a sense of hoarseness and irritation in her throat as well. Denies fevers or chills. Is due for her last chemotherapy treatment next week and will want to 28-32 rounds of radiation as tolerated. She acknowledges this has been a stressful period but overall feels she is managing well with the support of her daughters and her church community. No febrile ailments since her first chemotherapy treatment and no other acute complaints noted today. Denies CP/palp/SOB/HA/congestion/fevers. Taking meds as prescribed  Past Medical History:  Diagnosis Date  . Anemia 04/05/2012  . Anxiety   . Anxiety state 09/11/2007   Qualifier: Diagnosis of  By: Scherrie Gerlach    . Atrial tachycardia, paroxysmal ([place])   . Back pain 10/02/2014  . Chicken pox as a child  . Chronic diastolic CHF (congestive heart failure), NYHA class 1 ([place])   . Complication of anesthesia    pt states feels different in her body  after anesthesia when waking up and also experiences a smell of burnt plastic for approx a wk   . Dilated aortic root ([place])    22m by echo 09/2016  . Elevated LFTs 04/05/2012  . GERD (gastroesophageal reflux disease)   . Goiter   . Heart murmur    hx of one at birth   . History of kidney stones   . Hx of colonic polyps   . Hyperlipidemia   . Hypertension   . Insomnia 10/08/2016  . Malignant neoplasm of upper-outer quadrant of left female breast ([employer] 05/14/2016   not with patient  . Measles as a child  . Mumps as a child  . OA (osteoarthritis) of knee 04/05/2012  . Obesity 07/11/2014  . PONV (postoperative nausea and vomiting)   . Preventative health care 09/05/2013  . PVC's (premature ventricular contractions) 05/02/2016  . Shortness of breath dyspnea    walking distances / climbing stairs  . Sleep apnea 04/05/2012  . Tinea corporis 02/23/2013  . Vasomotor rhinitis 04/05/2012  . Ventral hernia     Past Surgical History:  Procedure Laterality Date  . BREAST LUMPECTOMY WITH RADIOACTIVE SEED AND SENTINEL LYMPH NODE BIOPSY Left 06/21/2016   Procedure: LEFT BREAST LUMPECTOMY WITH RADIOACTIVE SEED AND SENTINEL LYMPH NODE BIOPSY, INJECT BLUE DYE LEFT BREAST;  Surgeon: HFanny Skates MD;  [facility]  Service: General;  Laterality: Left;  . CARDIAC CATHETERIZATION     normal coroary arteries per patient  . CARDIOVASCULAR STRESS TEST  10/12/2013  . CESAREAN SECTION     X 3  . CHOLECYSTECTOMY    . COLONOSCOPY WITH PROPOFOL N/A 03/28/2015   Procedure: COLONOSCOPY WITH PROPOFOL;  Surgeon: Juanita Craver, MD;  Location: [facility];  Service: Endoscopy;  Laterality: N/A;  . HERNIA REPAIR  02/08/11   ventral hernia  . JOINT REPLACEMENT     bilateral  . KNEE ARTHROSCOPY  05/2010   bilateral  . LEFT AND RIGHT HEART CATHETERIZATION WITH CORONARY ANGIOGRAM N/A 11/08/2013   Procedure: LEFT AND RIGHT HEART CATHETERIZATION WITH CORONARY ANGIOGRAM;  Surgeon: [name], MD;   Location: [facility] CATH LAB;  Service: Cardiovascular;  Laterality: N/A;  . MENISCUS REPAIR  2009  . MOUTH SURGERY     teeth implants  . PORTACATH PLACEMENT Right 07/16/2016   Procedure: INSERTION PORT-A-CATH RIGHT INTERNAL JUGULAR WITH ULTRASOUND;  Surgeon: Fanny Skates, MD;  Location: [facility];  Service: General;  Laterality: Right;  . TOTAL KNEE ARTHROPLASTY  2011   left  . TOTAL KNEE ARTHROPLASTY Right 05/10/2014   Procedure: RIGHT TOTAL KNEE ARTHROPLASTY;  Surgeon: Mauri Pole, MD;  Location: [facility];  Service: Orthopedics;  Laterality: Right;  . TUBAL LIGATION    . WISDOM TOOTH EXTRACTION  2000    Family History  Problem Relation Age of Onset  . Thyroid disease Mother   . Hyperlipidemia Mother   . Heart attack Father 58  . Hypertension Father   . Arthritis Father     RA  . Coronary artery disease Father   . Coronary artery disease Brother   . Heart disease Brother   . Cancer Maternal Aunt     colon  . Cancer Maternal Grandmother     colon  . Heart attack Maternal Grandfather   . Alcohol abuse Paternal Grandfather     Social History   Social History  . Marital status: Divorced    Spouse name: N/A  . Number of children: 3  . Years of education: N/A   Occupational History  . retired    Social History Main Topics  . Smoking status: Never Smoker  . Smokeless tobacco: Never Used  . Alcohol use Yes     Comment: rarely  . Drug use: No  . Sexual activity: No     Comment: lives with mother currently-stress   Other Topics Concern  . Not on file   Social History Narrative  . No narrative on file    Outpatient Medications Prior to Visit  Medication Sig Dispense Refill  . acetaminophen (TYLENOL) 500 MG tablet Take 1,000 mg by mouth 2 (two) times daily as needed for moderate pain or headache.    . albuterol (PROVENTIL HFA;VENTOLIN HFA) 108 (90 Base) MCG/ACT inhaler Inhale 2 puffs into the lungs every 6 (six) hours as needed for wheezing or shortness of breath. 1  Inhaler 6  . aspirin EC 81 MG tablet Take 81 mg by mouth at bedtime.     . beclomethasone (QVAR) 80 MCG/ACT inhaler Inhale 1-2 puffs into the lungs 2 (two) times daily. (Patient taking differently: Inhale 1-2 puffs into the lungs 2 (two) times daily as needed (shortness of breath). ) 1 Inhaler 5  . clotrimazole-betamethasone (LOTRISONE) cream Apply 1 application topically 2 (two) times daily as needed (for rash). 30 g 0  . diclofenac (VOLTAREN) 75 MG EC tablet Take 1 tablet (75 mg total) by mouth 2 (two) times daily. 180 tablet 1  . diltiazem (CARDIZEM CD) 120 MG 24 hr capsule Take 1  capsule (120 mg total) by mouth daily. 90 capsule 2  . diphenoxylate-atropine (LOMOTIL) 2.5-0.025 MG tablet Take 1 tablet by mouth 4 (four) times daily as needed for diarrhea or loose stools. 60 tablet 0  . escitalopram (LEXAPRO) 20 MG tablet TAKE 1 TABLET ONE TIME DAILY 90 tablet 4  . esomeprazole (NEXIUM) 40 MG capsule Take 40 mg by mouth daily.     . fluticasone (FLONASE) 50 MCG/ACT nasal spray Place 1 spray into both nostrils daily as needed for allergies. (Patient taking differently: Place 1 spray into both nostrils daily as needed (FOR SINUS ISSUE). ) 48 g 1  . furosemide (LASIX) 20 MG tablet Take 1 tablet (20 mg total) by mouth daily. 90 tablet 2  . Hypromellose (ARTIFICIAL TEARS OP) Apply 1 drop to eye daily as needed (dry eyes).    Marland Kitchen lidocaine-prilocaine (EMLA) cream Apply to affected area once 30 g 3  . LORazepam (ATIVAN) 0.5 MG tablet Take 1 tablet (0.5 mg total) by mouth every 6 (six) hours as needed (Nausea or vomiting). 30 tablet 0  . losartan (COZAAR) 100 MG tablet Take 1 tablet (100 mg total) by mouth daily. 90 tablet 2  . montelukast (SINGULAIR) 10 MG tablet Take 1 tablet (10 mg total) by mouth at bedtime. 90 tablet 2  . nebivolol (BYSTOLIC) 10 MG tablet Take 1 tablet (10 mg total) by mouth at bedtime. 90 tablet 2  . ondansetron (ZOFRAN) 8 MG tablet Take 1 tablet (8 mg total) by mouth 2 (two) times  daily as needed (Nausea or vomiting). 30 tablet 1  . prochlorperazine (COMPAZINE) 10 MG tablet Take 1 tablet (10 mg total) by mouth every 6 (six) hours as needed (Nausea or vomiting). 30 tablet 3  . rosuvastatin (CRESTOR) 40 MG tablet Take 1 tablet (40 mg total) by mouth daily. (Patient taking differently: Take 40 mg by mouth at bedtime. ) 90 tablet 3  . triamcinolone (KENALOG) 0.025 % ointment Apply 1 application topically 2 (two) times daily. 30 g 0  . azithromycin (ZITHROMAX Z-PAK) 250 MG tablet As directed (Patient not taking: Reported on 10/08/2016) 6 each 0  . HYDROcodone-acetaminophen (NORCO) 5-325 MG tablet Take 1-2 tablets by mouth every 6 (six) hours as needed for moderate pain or severe pain. (Patient not taking: Reported on 10/08/2016) 30 tablet 0   No facility-administered medications prior to visit.     Allergies  Allergen Reactions  . No Known Allergies     Review of Systems  Constitutional: Positive for malaise/fatigue. Negative for fever.  HENT: Negative for congestion.   Eyes: Negative for blurred vision.  Respiratory: Negative for shortness of breath.   Cardiovascular: Negative for chest pain, palpitations and leg swelling.  Gastrointestinal: Positive for nausea. Negative for abdominal pain and blood in stool.  Genitourinary: Negative for dysuria and frequency.  Musculoskeletal: Negative for falls.  Skin: Negative for rash.  Neurological: Negative for dizziness, loss of consciousness and headaches.  Endo/Heme/Allergies: Negative for environmental allergies.  Psychiatric/Behavioral: Negative for depression. The patient has insomnia. The patient is not nervous/anxious.        Objective:    Physical Exam  Constitutional: She is oriented to person, place, and time. She appears well-developed and well-nourished. No distress.  HENT:  Head: Normocephalic and atraumatic.  Nose: Nose normal.  Eyes: Right eye exhibits no discharge. Left eye exhibits no discharge.  Neck:  Normal range of motion. Neck supple.  Cardiovascular: Normal rate and regular rhythm.   No murmur heard. Pulmonary/Chest: Effort normal  and breath sounds normal.  Abdominal: Soft. Bowel sounds are normal. There is no tenderness.  Musculoskeletal: She exhibits no edema.  Neurological: She is alert and oriented to person, place, and time.  Skin: Skin is warm and dry.  Psychiatric: She has a normal mood and affect.  Nursing note and vitals reviewed.   BP (!) 134/92 (BP Location: Left Arm, Patient Position: Sitting, Cuff Size: Large)   Pulse 87   Temp 98.1 F (36.7 C) (Oral)   Resp 16   Ht '5\' 5"'  (1.651 m)   Wt (!) 331 lb (150.1 kg)   SpO2 97%   BMI 55.08 kg/m  Wt Readings from Last 3 Encounters:  10/08/16 (!) 331 lb (150.1 kg)  08/29/16 (!) 327 lb 1.6 oz (148.4 kg)  08/15/16 (!) 325 lb 3.2 oz (147.5 kg)     Lab Results  Component Value Date   WBC 7.9 10/03/2016   HGB 13.2 10/03/2016   HCT 39.6 10/03/2016   PLT 270 10/03/2016   GLUCOSE 169 (H) 10/03/2016   CHOL 169 05/24/2016   TRIG 194 (H) 05/24/2016   HDL 36 (L) 05/24/2016   LDLDIRECT 102.0 10/16/2015   LDLCALC 94 05/24/2016   ALT 70 (H) 10/03/2016   AST 48 (H) 10/03/2016   NA 143 10/03/2016   K 3.9 10/03/2016   CL 104 07/16/2016   CREATININE 0.8 10/03/2016   BUN 16.1 10/03/2016   CO2 25 10/03/2016   TSH 0.95 05/02/2016   INR 1.01 05/03/2014   HGBA1C 6.5 10/16/2015    Lab Results  Component Value Date   TSH 0.95 05/02/2016   Lab Results  Component Value Date   WBC 7.9 10/03/2016   HGB 13.2 10/03/2016   HCT 39.6 10/03/2016   MCV 89.8 10/03/2016   PLT 270 10/03/2016   Lab Results  Component Value Date   NA 143 10/03/2016   K 3.9 10/03/2016   CHLORIDE 106 10/03/2016   CO2 25 10/03/2016   GLUCOSE 169 (H) 10/03/2016   BUN 16.1 10/03/2016   CREATININE 0.8 10/03/2016   BILITOT 0.56 10/03/2016   ALKPHOS 85 10/03/2016   AST 48 (H) 10/03/2016   ALT 70 (H) 10/03/2016   PROT 6.5 10/03/2016   ALBUMIN  3.3 (L) 10/03/2016   CALCIUM 9.8 10/03/2016   ANIONGAP 13 (H) 10/03/2016   EGFR 78 (L) 10/03/2016   GFR 82.45 10/16/2015   Lab Results  Component Value Date   CHOL 169 05/24/2016   Lab Results  Component Value Date   HDL 36 (L) 05/24/2016   Lab Results  Component Value Date   LDLCALC 94 05/24/2016   Lab Results  Component Value Date   TRIG 194 (H) 05/24/2016   Lab Results  Component Value Date   CHOLHDL 4.7 05/24/2016   Lab Results  Component Value Date   HGBA1C 6.5 10/16/2015       Assessment & Plan:   Problem List Items Addressed This Visit    Hyperlipidemia    Encouraged heart healthy diet, increase exercise, avoid trans fats, consider a krill oil cap daily. Crestor      Relevant Orders   Lipid panel   Essential hypertension    Well controlled, no changes to meds. Encouraged heart healthy diet such as the DASH diet and exercise as tolerated.       Obstructive sleep apnea    Is using her CPAP nightly, is noting some trouble with wheezing and more trouble with breathing when lying flat since starting chemo.  She is encouraged to hydrate better, start mucinex, clean cpap equipment with infrared treatment and will refer back to pulmonology after her radiation treatments are done      Elevated LFTs    Mild, stable, no concerns      Hyperglycemia    hgba1c acceptable, minimize simple carbs. Increase exercise as tolerated.       Relevant Orders   Hemoglobin A1c   Obesity    Encouraged DASH diet, decrease po intake and increase exercise as tolerated. Needs 7-8 hours of sleep nightly. Avoid trans fats, eat small, frequent meals every 4-5 hours with lean proteins, complex carbs and healthy fats. Minimize simple carbs, consider bariatrics      Malignant neoplasm of upper-outer quadrant of left female breast ([place])    Is tolerating chemo, had a lot of side effects initially but now managing with meds. Can try ginger for nausea. Radiation will be for 28 to 32  days      Relevant Medications   cefdinir (OMNICEF) 300 MG capsule   Insomnia    Steroid induced. Occurs after getting steroids prior to chemo due to her side effects. Instructed to try taking a full 1 mg of the Lorazepam if not effective let us know and we can consider Ambien for those nights. She is almost done with chemo         I have discontinued Ms. Atkerson HYDROcodone-acetaminophen. I am also having her start on cefdinir. Additionally, I am having her maintain her clotrimazole-betamethasone, fluticasone, beclomethasone, albuterol, nebivolol, montelukast, losartan, furosemide, diltiazem, diclofenac, escitalopram, esomeprazole, aspirin EC, rosuvastatin, Hypromellose (ARTIFICIAL TEARS OP), acetaminophen, ondansetron, lidocaine-prilocaine, triamcinolone, diphenoxylate-atropine, prochlorperazine, LORazepam, and azithromycin.  Meds ordered this encounter  Medications  . cefdinir (OMNICEF) 300 MG capsule    Sig: Take 1 capsule (300 mg total) by mouth 2 (two) times daily.    Dispense:  20 capsule    Refill:  0     Penni Homans, MD

## 2016-10-08 NOTE — Assessment & Plan Note (Signed)
Mild, stable, no concerns

## 2016-10-08 NOTE — Assessment & Plan Note (Addendum)
Encouraged heart healthy diet, increase exercise, avoid trans fats, consider a krill oil cap daily. Crestor

## 2016-10-08 NOTE — Progress Notes (Signed)
Pre visit review using our clinic review tool, if applicable. No additional management support is needed unless otherwise documented below in the visit note. 

## 2016-10-08 NOTE — Assessment & Plan Note (Signed)
Is using her CPAP nightly, is noting some trouble with wheezing and more trouble with breathing when lying flat since starting chemo. She is encouraged to hydrate better, start mucinex, clean cpap equipment with infrared treatment and will refer back to pulmonology after her radiation treatments are done

## 2016-10-08 NOTE — Assessment & Plan Note (Signed)
Steroid induced. Occurs after getting steroids prior to chemo due to her side effects. Instructed to try taking a full 1 mg of the Lorazepam if not effective let us know and we can consider Ambien for those nights. She is almost done with chemo

## 2016-10-08 NOTE — Assessment & Plan Note (Signed)
Encouraged DASH diet, decrease po intake and increase exercise as tolerated. Needs 7-8 hours of sleep nightly. Avoid trans fats, eat small, frequent meals every 4-5 hours with lean proteins, complex carbs and healthy fats. Minimize simple carbs, consider bariatrics 

## 2016-10-08 NOTE — Assessment & Plan Note (Signed)
Is tolerating chemo, had a lot of side effects initially but now managing with meds. Can try ginger for nausea. Radiation will be for 28 to 32 days

## 2016-10-08 NOTE — Assessment & Plan Note (Signed)
Well controlled, no changes to meds. Encouraged heart healthy diet such as the DASH diet and exercise as tolerated.  °

## 2016-10-08 NOTE — Assessment & Plan Note (Signed)
hgba1c acceptable, minimize simple carbs. Increase exercise as tolerated.  

## 2016-10-08 NOTE — Patient Instructions (Addendum)
[email or web address].com for natural shampoos etc Witch hazel Astringent and desitin with zinc Benefiber twice daily Ginger caps Mucinex twice daily x 10 to 14 days  Nausea, Adult Nausea is the feeling of an upset stomach or having to vomit. Nausea on its own is not usually a serious concern, but it may be an early sign of a more serious medical problem. As nausea gets worse, it can lead to vomiting. If vomiting develops, or if you are not able to drink enough fluids, you are at risk of becoming dehydrated. Dehydration can make you tired and thirsty, cause you to have a dry mouth, and decrease how often you urinate. Older adults and people with other diseases or a weak immune system are at higher risk for dehydration. The main goals of treating your nausea are:  To limit repeated nausea episodes.  To prevent vomiting and dehydration. Follow these instructions at home: Follow instructions from your health care provider about how to care for yourself at home. Eating and drinking  Follow these recommendations as told by your health care provider:  Take an oral rehydration solution (ORS). This is a drink that is sold at pharmacies and retail stores.  Drink clear fluids in small amounts as you are able. Clear fluids include water, ice chips, diluted fruit juice, and low-calorie sports drinks.  Eat bland, easy-to-digest foods in small amounts as you are able. These foods include bananas, applesauce, rice, lean meats, toast, and crackers.  Avoid drinking fluids that contain a lot of sugar or caffeine, such as energy drinks, sports drinks, and soda.  Avoid alcohol.  Avoid spicy or fatty foods. General instructions   Drink enough fluid to keep your urine clear or pale yellow.  Wash your hands often. If soap and water are not available, use hand sanitizer.  Make sure that all people in your household wash their hands well and often.  Rest at home while you recover.  Take over-the-counter and  prescription medicines only as told by your health care provider.  Breathe slowly and deeply when you feel nauseous.  Watch your condition for any changes.  Keep all follow-up visits as told by your health care provider. This is important. Contact a health care provider if:  You have a headache.  You have new symptoms.  Your nausea gets worse.  You have a fever.  You feel light-headed or dizzy.  You vomit.  You cannot keep fluids down. Get help right away if:  You have pain in your chest, neck, arm, or jaw.  You feel extremely weak or you faint.  You have vomit that is bright red or looks like coffee grounds.  You have bloody or black stools or stools that look like tar.  You have a severe headache, a stiff neck, or both.  You have severe pain, cramping, or bloating in your abdomen.  You have a rash.  You have difficulty breathing or are breathing very quickly.  Your heart is beating very quickly.  Your skin feels cold and clammy.  You feel confused.  You have pain when you urinate.  You have signs of dehydration, such as:  Dark urine, very little, or no urine.  Cracked lips.  Dry mouth.  Sunken eyes.  Sleepiness.  Weakness. These symptoms may represent a serious problem that is an emergency. Do not wait to see if the symptoms will go away. Get medical help right away. Call your local emergency services (911 in the [place] Do not drive yourself to  the hospital. This information is not intended to replace advice given to you by your health care provider. Make sure you discuss any questions you have with your health care provider. Document Released: 07/18/2004 Document Revised: 11/13/2015 Document Reviewed: 02/14/2015 Elsevier Interactive Patient Education  2017 [employer].

## 2016-10-09 ENCOUNTER — Other Ambulatory Visit: Payer: Self-pay | Admitting: Family Medicine

## 2016-10-10 ENCOUNTER — Encounter: Payer: Self-pay | Admitting: *Deleted

## 2016-10-10 ENCOUNTER — Ambulatory Visit: Payer: Medicare HMO

## 2016-10-10 ENCOUNTER — Ambulatory Visit (HOSPITAL_BASED_OUTPATIENT_CLINIC_OR_DEPARTMENT_OTHER): Payer: Medicare HMO

## 2016-10-10 ENCOUNTER — Other Ambulatory Visit (HOSPITAL_BASED_OUTPATIENT_CLINIC_OR_DEPARTMENT_OTHER): Payer: Medicare HMO

## 2016-10-10 VITALS — BP 127/80 | HR 66 | Temp 98.9°F | Resp 17

## 2016-10-10 DIAGNOSIS — C50412 Malignant neoplasm of upper-outer quadrant of left female breast: Secondary | ICD-10-CM

## 2016-10-10 DIAGNOSIS — Z95828 Presence of other vascular implants and grafts: Secondary | ICD-10-CM

## 2016-10-10 DIAGNOSIS — C773 Secondary and unspecified malignant neoplasm of axilla and upper limb lymph nodes: Secondary | ICD-10-CM | POA: Diagnosis not present

## 2016-10-10 DIAGNOSIS — Z170 Estrogen receptor positive status [ER+]: Principal | ICD-10-CM

## 2016-10-10 DIAGNOSIS — Z5111 Encounter for antineoplastic chemotherapy: Secondary | ICD-10-CM | POA: Diagnosis not present

## 2016-10-10 LAB — CBC WITH DIFFERENTIAL/PLATELET
BASO%: 0.4 % (ref 0.0–2.0)
BASOS ABS: 0 10*3/uL (ref 0.0–0.1)
EOS ABS: 0 10*3/uL (ref 0.0–0.5)
EOS%: 0.5 % (ref 0.0–7.0)
HEMATOCRIT: 38.7 % (ref 34.8–46.6)
HGB: 12.9 g/dL (ref 11.6–15.9)
LYMPH#: 1.7 10*3/uL (ref 0.9–3.3)
LYMPH%: 22.5 % (ref 14.0–49.7)
MCH: 30.2 pg (ref 25.1–34.0)
MCHC: 33.4 g/dL (ref 31.5–36.0)
MCV: 90.2 fL (ref 79.5–101.0)
MONO#: 0.5 10*3/uL (ref 0.1–0.9)
MONO%: 6 % (ref 0.0–14.0)
NEUT#: 5.4 10*3/uL (ref 1.5–6.5)
NEUT%: 70.6 % (ref 38.4–76.8)
PLATELETS: 254 10*3/uL (ref 145–400)
RBC: 4.29 10*6/uL (ref 3.70–5.45)
RDW: 20 % — ABNORMAL HIGH (ref 11.2–14.5)
WBC: 7.7 10*3/uL (ref 3.9–10.3)

## 2016-10-10 LAB — COMPREHENSIVE METABOLIC PANEL
ALT: 69 U/L — AB (ref 0–55)
ANION GAP: 14 meq/L — AB (ref 3–11)
AST: 38 U/L — ABNORMAL HIGH (ref 5–34)
Albumin: 3.3 g/dL — ABNORMAL LOW (ref 3.5–5.0)
Alkaline Phosphatase: 78 U/L (ref 40–150)
BUN: 18.8 mg/dL (ref 7.0–26.0)
CALCIUM: 9.9 mg/dL (ref 8.4–10.4)
CHLORIDE: 104 meq/L (ref 98–109)
CO2: 24 meq/L (ref 22–29)
Creatinine: 0.8 mg/dL (ref 0.6–1.1)
EGFR: 82 mL/min/{1.73_m2} — AB (ref >90)
Glucose: 153 mg/dl — ABNORMAL HIGH (ref 70–140)
POTASSIUM: 4.2 meq/L (ref 3.5–5.1)
Sodium: 142 mEq/L (ref 136–145)
TOTAL PROTEIN: 6.5 g/dL (ref 6.4–8.3)
Total Bilirubin: 0.52 mg/dL (ref 0.20–1.20)

## 2016-10-10 MED ORDER — FAMOTIDINE IN NACL 20-0.9 MG/50ML-% IV SOLN
20.0000 mg | Freq: Once | INTRAVENOUS | Status: AC
  Administered 2016-10-10: 20 mg via INTRAVENOUS

## 2016-10-10 MED ORDER — FAMOTIDINE IN NACL 20-0.9 MG/50ML-% IV SOLN
INTRAVENOUS | Status: AC
  Filled 2016-10-10: qty 50

## 2016-10-10 MED ORDER — SODIUM CHLORIDE 0.9% FLUSH
10.0000 mL | INTRAVENOUS | Status: DC | PRN
  Administered 2016-10-10: 10 mL
  Filled 2016-10-10: qty 10

## 2016-10-10 MED ORDER — DEXAMETHASONE SODIUM PHOSPHATE 100 MG/10ML IJ SOLN
20.0000 mg | Freq: Once | INTRAMUSCULAR | Status: AC
  Administered 2016-10-10: 20 mg via INTRAVENOUS
  Filled 2016-10-10: qty 2

## 2016-10-10 MED ORDER — SODIUM CHLORIDE 0.9 % IV SOLN
Freq: Once | INTRAVENOUS | Status: AC
  Administered 2016-10-10: 09:00:00 via INTRAVENOUS

## 2016-10-10 MED ORDER — HEPARIN SOD (PORK) LOCK FLUSH 100 UNIT/ML IV SOLN
500.0000 [IU] | Freq: Once | INTRAVENOUS | Status: DC | PRN
  Filled 2016-10-10: qty 5

## 2016-10-10 MED ORDER — HEPARIN SOD (PORK) LOCK FLUSH 100 UNIT/ML IV SOLN
500.0000 [IU] | Freq: Once | INTRAVENOUS | Status: AC | PRN
  Administered 2016-10-10: 500 [IU]
  Filled 2016-10-10: qty 5

## 2016-10-10 MED ORDER — SODIUM CHLORIDE 0.9% FLUSH
10.0000 mL | INTRAVENOUS | Status: DC | PRN
  Administered 2016-10-10: 10 mL via INTRAVENOUS
  Filled 2016-10-10: qty 10

## 2016-10-10 MED ORDER — DIPHENHYDRAMINE HCL 50 MG/ML IJ SOLN
50.0000 mg | Freq: Once | INTRAMUSCULAR | Status: AC
  Administered 2016-10-10: 50 mg via INTRAVENOUS

## 2016-10-10 MED ORDER — DIPHENHYDRAMINE HCL 50 MG/ML IJ SOLN
INTRAMUSCULAR | Status: AC
  Filled 2016-10-10: qty 1

## 2016-10-10 MED ORDER — DEXTROSE 5 % IV SOLN
50.0000 mg/m2 | Freq: Once | INTRAVENOUS | Status: AC
  Administered 2016-10-10: 132 mg via INTRAVENOUS
  Filled 2016-10-10: qty 22

## 2016-10-10 NOTE — Patient Instructions (Signed)
[facility] Discharge Instructions for Patients Receiving Chemotherapy  Today you received the following chemotherapy agents Taxol   To help prevent nausea and vomiting after your treatment, we encourage you to take your nausea medication as directed.   If you develop nausea and vomiting that is not controlled by your nausea medication, call the clinic.   BELOW ARE SYMPTOMS THAT SHOULD BE REPORTED IMMEDIATELY:  *FEVER GREATER THAN 100.5 F  *CHILLS WITH OR WITHOUT FEVER  NAUSEA AND VOMITING THAT IS NOT CONTROLLED WITH YOUR NAUSEA MEDICATION  *UNUSUAL SHORTNESS OF BREATH  *UNUSUAL BRUISING OR BLEEDING  TENDERNESS IN MOUTH AND THROAT WITH OR WITHOUT PRESENCE OF ULCERS  *URINARY PROBLEMS  *BOWEL PROBLEMS  UNUSUAL RASH Items with * indicate a potential emergency and should be followed up as soon as possible.  Feel free to call the clinic you have any questions or concerns. The clinic phone number is [phone].  Please show the CHEMO ALERT CARD at check-in to the Emergency Department and triage nurse.   

## 2016-10-10 NOTE — Patient Instructions (Signed)

## 2016-10-14 ENCOUNTER — Telehealth: Payer: Self-pay | Admitting: *Deleted

## 2016-10-14 NOTE — Telephone Encounter (Signed)
"  I'm calling to request an earlier appointment  Time on Thursday 10-17-2016 if anything is available."  Notified [name] her provider does not have any openings at anytime of day on 10-17-2016.  "Okay, I was just checking."  No further questions.

## 2016-10-17 ENCOUNTER — Ambulatory Visit: Payer: Medicare HMO

## 2016-10-17 ENCOUNTER — Ambulatory Visit: Payer: Commercial Managed Care - HMO | Admitting: Family Medicine

## 2016-10-17 ENCOUNTER — Ambulatory Visit (HOSPITAL_BASED_OUTPATIENT_CLINIC_OR_DEPARTMENT_OTHER): Payer: Medicare HMO

## 2016-10-17 ENCOUNTER — Other Ambulatory Visit (HOSPITAL_BASED_OUTPATIENT_CLINIC_OR_DEPARTMENT_OTHER): Payer: Medicare HMO

## 2016-10-17 ENCOUNTER — Encounter: Payer: Self-pay | Admitting: Hematology and Oncology

## 2016-10-17 ENCOUNTER — Ambulatory Visit (HOSPITAL_BASED_OUTPATIENT_CLINIC_OR_DEPARTMENT_OTHER): Payer: Medicare HMO | Admitting: Hematology and Oncology

## 2016-10-17 DIAGNOSIS — Z170 Estrogen receptor positive status [ER+]: Secondary | ICD-10-CM | POA: Diagnosis not present

## 2016-10-17 DIAGNOSIS — C50412 Malignant neoplasm of upper-outer quadrant of left female breast: Secondary | ICD-10-CM | POA: Diagnosis not present

## 2016-10-17 DIAGNOSIS — Z95828 Presence of other vascular implants and grafts: Secondary | ICD-10-CM

## 2016-10-17 DIAGNOSIS — Z5112 Encounter for antineoplastic immunotherapy: Secondary | ICD-10-CM

## 2016-10-17 LAB — CBC WITH DIFFERENTIAL/PLATELET
BASO%: 0.9 % (ref 0.0–2.0)
Basophils Absolute: 0.1 10*3/uL (ref 0.0–0.1)
EOS%: 0.7 % (ref 0.0–7.0)
Eosinophils Absolute: 0.1 10*3/uL (ref 0.0–0.5)
HEMATOCRIT: 38.2 % (ref 34.8–46.6)
HGB: 12.7 g/dL (ref 11.6–15.9)
LYMPH%: 20.5 % (ref 14.0–49.7)
MCH: 30.4 pg (ref 25.1–34.0)
MCHC: 33.4 g/dL (ref 31.5–36.0)
MCV: 91.3 fL (ref 79.5–101.0)
MONO#: 0.5 10*3/uL (ref 0.1–0.9)
MONO%: 5.6 % (ref 0.0–14.0)
NEUT%: 72.3 % (ref 38.4–76.8)
NEUTROS ABS: 5.9 10*3/uL (ref 1.5–6.5)
Platelets: 276 10*3/uL (ref 145–400)
RBC: 4.18 10*6/uL (ref 3.70–5.45)
RDW: 20.3 % — ABNORMAL HIGH (ref 11.2–14.5)
WBC: 8.1 10*3/uL (ref 3.9–10.3)
lymph#: 1.7 10*3/uL (ref 0.9–3.3)

## 2016-10-17 LAB — COMPREHENSIVE METABOLIC PANEL
ALBUMIN: 3.3 g/dL — AB (ref 3.5–5.0)
ALT: 79 U/L — ABNORMAL HIGH (ref 0–55)
AST: 62 U/L — AB (ref 5–34)
Alkaline Phosphatase: 77 U/L (ref 40–150)
Anion Gap: 12 mEq/L — ABNORMAL HIGH (ref 3–11)
BUN: 18.4 mg/dL (ref 7.0–26.0)
CHLORIDE: 105 meq/L (ref 98–109)
CO2: 26 mEq/L (ref 22–29)
Calcium: 9.9 mg/dL (ref 8.4–10.4)
Creatinine: 0.8 mg/dL (ref 0.6–1.1)
EGFR: 75 mL/min/{1.73_m2} — ABNORMAL LOW (ref >90)
GLUCOSE: 156 mg/dL — AB (ref 70–140)
POTASSIUM: 4.3 meq/L (ref 3.5–5.1)
SODIUM: 143 meq/L (ref 136–145)
Total Bilirubin: 0.54 mg/dL (ref 0.20–1.20)
Total Protein: 6.4 g/dL (ref 6.4–8.3)

## 2016-10-17 MED ORDER — SODIUM CHLORIDE 0.9 % IV SOLN
Freq: Once | INTRAVENOUS | Status: AC
  Administered 2016-10-17: 13:00:00 via INTRAVENOUS

## 2016-10-17 MED ORDER — HEPARIN SOD (PORK) LOCK FLUSH 100 UNIT/ML IV SOLN
500.0000 [IU] | Freq: Once | INTRAVENOUS | Status: DC | PRN
  Filled 2016-10-17: qty 5

## 2016-10-17 MED ORDER — SODIUM CHLORIDE 0.9 % IV SOLN
420.0000 mg | Freq: Once | INTRAVENOUS | Status: AC
  Administered 2016-10-17: 420 mg via INTRAVENOUS
  Filled 2016-10-17: qty 14

## 2016-10-17 MED ORDER — ACETAMINOPHEN 325 MG PO TABS
ORAL_TABLET | ORAL | Status: AC
  Filled 2016-10-17: qty 2

## 2016-10-17 MED ORDER — DIPHENHYDRAMINE HCL 25 MG PO CAPS
ORAL_CAPSULE | ORAL | Status: AC
  Filled 2016-10-17: qty 2

## 2016-10-17 MED ORDER — SODIUM CHLORIDE 0.9% FLUSH
10.0000 mL | INTRAVENOUS | Status: DC | PRN
  Filled 2016-10-17: qty 10

## 2016-10-17 MED ORDER — ACETAMINOPHEN 325 MG PO TABS
650.0000 mg | ORAL_TABLET | Freq: Once | ORAL | Status: AC
  Administered 2016-10-17: 650 mg via ORAL

## 2016-10-17 MED ORDER — DIPHENHYDRAMINE HCL 25 MG PO CAPS
50.0000 mg | ORAL_CAPSULE | Freq: Once | ORAL | Status: AC
Start: 2016-10-17 — End: 2016-10-17
  Administered 2016-10-17: 50 mg via ORAL

## 2016-10-17 MED ORDER — SODIUM CHLORIDE 0.9% FLUSH
10.0000 mL | INTRAVENOUS | Status: DC | PRN
  Administered 2016-10-17: 10 mL via INTRAVENOUS
  Filled 2016-10-17: qty 10

## 2016-10-17 MED ORDER — TRASTUZUMAB CHEMO 150 MG IV SOLR
6.0000 mg/kg | Freq: Once | INTRAVENOUS | Status: AC
  Administered 2016-10-17: 882 mg via INTRAVENOUS
  Filled 2016-10-17: qty 42

## 2016-10-17 NOTE — Assessment & Plan Note (Signed)
06/21/16: Left lumpectomy: IDC grade 3, 1.1 cm, 1/5 LN Positive with ECE, Margins Neg, LVI Present; Er 30%, PR 0%, Ki 67 40%, Her 2 Positive Ratio 2.71; T1CN1 (Stage 2B)  Patient has multiple comorbidities including history of LVH with congestive heart failure  Treatment Plan: 1. consideration for adjuvant Taxol with Herceptin And Perjetaif her cardiologist allows [name] to treat her with anti-HER-2 therapy. (Last echocardiogram showed an ejection fraction of 55-60%--due for repeat on 09/24/2016) 2. Followed by adjuvant radiation 3. Followed by adjuvant antiestrogen therapy with anastrozole 1 mg daily 5 years (bone density 04/29/2016 T score -1.2) ----------------------------------------------------------------------------------------------------------------------------------------------------- Current Treatment: Completed 12 cycles ofweeklyTaxol on 10/10/2016;Herceptin and Perjeta Q 3 weeks  Continue with Herceptin and Perjeta every 3 weeks Radiation oncology appointment Return to clinic every 6 weeks for follow-up with me

## 2016-10-17 NOTE — Progress Notes (Signed)
Patient Care Team: [name], MD as PCP - General (Family Medicine) Fanny Skates, MD as Consulting Physician (General Surgery) Nicholas Lose, MD as Consulting Physician (Hematology and Oncology) Gery Pray, MD as Consulting Physician (Radiation Oncology) Gardenia Phlegm, NP as Nurse Practitioner (Hematology and Oncology)  DIAGNOSIS:  Encounter Diagnosis  Name Primary?  . Malignant neoplasm of upper-outer quadrant of left breast in female, estrogen receptor positive ([place])     SUMMARY OF ONCOLOGIC HISTORY:   Malignant neoplasm of upper-outer quadrant of left female breast ([place])   05/13/2016 Initial Diagnosis    Left breast biopsy 3:00: IDC, grade 3, ER 30%, PR 0%, Ki-67 40%, HER-2 positive ratio 2.71, screening detected left breast mass 9 x 7 x 6 mm, axilla negative, T1 BN 0 stage IA clinical stage      06/21/2016 Surgery    Left lumpectomy: IDC grade 3, 1.1 cm, 1/5 LN Positive with ECE, Margins Neg, LVI Present; Er 30%, PR 0%, Ki 67 40%, Her 2 Positive Ratio 2.71; T1CN1 (Stage 2B)      07/25/2016 -  Chemotherapy    Taxol weekly 12; Herceptin and Perjeta every 3 weeks        CHIEF COMPLIANT: Follow-up on Herceptin and Perjeta  INTERVAL HISTORY: OLIVINE [name] is a 66 year old with above-mentioned history left breast cancer treated with lumpectomy and she completed adjuvant chemotherapy. She is currently on Herceptin and Perjeta maintenance therapy. She had a recent echocardiogram which was normal. She has been tolerating these 2 treatments extremely well. Denies any nausea vomiting or diarrhea. She recently had upper respiratory infection and sinus infection. She was treated with antibiotics.  REVIEW OF SYSTEMS:   Constitutional: Denies fevers, chills or abnormal weight loss Eyes: Denies blurriness of vision Ears, nose, mouth, throat, and face: Denies mucositis or sore throat Respiratory: Cough expectoration and wheezing and sinus drainage Cardiovascular:  Denies palpitation, chest discomfort Gastrointestinal:  Denies nausea, heartburn or change in bowel habits Skin: Denies abnormal skin rashes Lymphatics: Denies new lymphadenopathy or easy bruising Neurological:Denies numbness, tingling or new weaknesses Behavioral/Psych: Mood is stable, no new changes  Extremities: No lower extremity edema Breast:  denies any pain or lumps or nodules in either breasts All other systems were reviewed with the patient and are negative.  I have reviewed the past medical history, past surgical history, social history and family history with the patient and they are unchanged from previous note.  ALLERGIES:  is allergic to no known allergies.  MEDICATIONS:  Current Outpatient Prescriptions  Medication Sig Dispense Refill  . acetaminophen (TYLENOL) 500 MG tablet Take 1,000 mg by mouth 2 (two) times daily as needed for moderate pain or headache.    . albuterol (PROVENTIL HFA;VENTOLIN HFA) 108 (90 Base) MCG/ACT inhaler Inhale 2 puffs into the lungs every 6 (six) hours as needed for wheezing or shortness of breath. 1 Inhaler 6  . aspirin EC 81 MG tablet Take 81 mg by mouth at bedtime.     Marland Kitchen azithromycin (ZITHROMAX Z-PAK) 250 MG tablet As directed (Patient not taking: Reported on 10/08/2016) 6 each 0  . beclomethasone (QVAR) 80 MCG/ACT inhaler Inhale 1-2 puffs into the lungs 2 (two) times daily. (Patient taking differently: Inhale 1-2 puffs into the lungs 2 (two) times daily as needed (shortness of breath). ) 1 Inhaler 5  . cefdinir (OMNICEF) 300 MG capsule Take 1 capsule (300 mg total) by mouth 2 (two) times daily. 20 capsule 0  . clotrimazole-betamethasone (LOTRISONE) cream Apply 1 application topically  2 (two) times daily as needed (for rash). 30 g 0  . diclofenac (VOLTAREN) 75 MG EC tablet TAKE 1 TABLET TWICE DAILY 180 tablet 1  . diltiazem (CARDIZEM CD) 120 MG 24 hr capsule Take 1 capsule (120 mg total) by mouth daily. 90 capsule 1  . diphenoxylate-atropine  (LOMOTIL) 2.5-0.025 MG tablet Take 1 tablet by mouth 4 (four) times daily as needed for diarrhea or loose stools. 60 tablet 0  . escitalopram (LEXAPRO) 20 MG tablet Take 1 tablet (20 mg total) by mouth daily. 90 tablet 1  . esomeprazole (NEXIUM) 40 MG capsule Take 40 mg by mouth daily.     . fluticasone (FLONASE) 50 MCG/ACT nasal spray Place 1 spray into both nostrils daily as needed for allergies. (Patient taking differently: Place 1 spray into both nostrils daily as needed (FOR SINUS ISSUE). ) 48 g 1  . furosemide (LASIX) 20 MG tablet Take 1 tablet (20 mg total) by mouth daily. 90 tablet 1  . Hypromellose (ARTIFICIAL TEARS OP) Apply 1 drop to eye daily as needed (dry eyes).    Marland Kitchen lidocaine-prilocaine (EMLA) cream Apply to affected area once 30 g 3  . LORazepam (ATIVAN) 0.5 MG tablet Take 1 tablet (0.5 mg total) by mouth every 6 (six) hours as needed (Nausea or vomiting). 30 tablet 0  . losartan (COZAAR) 100 MG tablet Take 1 tablet (100 mg total) by mouth daily. 90 tablet 3  . montelukast (SINGULAIR) 10 MG tablet Take 1 tablet (10 mg total) by mouth at bedtime. 90 tablet 1  . nebivolol (BYSTOLIC) 10 MG tablet Take 1 tablet (10 mg total) by mouth daily. 90 tablet 1  . ondansetron (ZOFRAN) 8 MG tablet Take 1 tablet (8 mg total) by mouth 2 (two) times daily as needed (Nausea or vomiting). 30 tablet 1  . prochlorperazine (COMPAZINE) 10 MG tablet Take 1 tablet (10 mg total) by mouth every 6 (six) hours as needed (Nausea or vomiting). 30 tablet 3  . rosuvastatin (CRESTOR) 40 MG tablet Take 1 tablet (40 mg total) by mouth daily. (Patient taking differently: Take 40 mg by mouth at bedtime. ) 90 tablet 3  . triamcinolone (KENALOG) 0.025 % ointment Apply 1 application topically 2 (two) times daily. 30 g 0   No current facility-administered medications for this visit.     PHYSICAL EXAMINATION: ECOG PERFORMANCE STATUS: 1 - Symptomatic but completely ambulatory  Vitals:   10/17/16 1147  BP: 138/79    Pulse: 78  Resp: 18  Temp: 98.7 F (37.1 C)   Filed Weights   10/17/16 1147  Weight: (!) 332 lb 14.4 oz (151 kg)    GENERAL:alert, no distress and comfortable SKIN: skin color, texture, turgor are normal, no rashes or significant lesions EYES: normal, Conjunctiva are pink and non-injected, sclera clear OROPHARYNX:no exudate, no erythema and lips, buccal mucosa, and tongue normal  NECK: supple, thyroid normal size, non-tender, without nodularity LYMPH:  no palpable lymphadenopathy in the cervical, axillary or inguinal LUNGS: Wheezing HEART: regular rate & rhythm and no murmurs and no lower extremity edema ABDOMEN:abdomen soft, non-tender and normal bowel sounds MUSCULOSKELETAL:no cyanosis of digits and no clubbing  NEURO: alert & oriented x 3 with fluent speech, no focal motor/sensory deficits EXTREMITIES: No lower extremity edema  LABORATORY DATA:  I have reviewed the data as listed   Chemistry      Component Value Date/Time   NA 143 10/17/2016 1055   K 4.3 10/17/2016 1055   CL 104 07/16/2016 0716  CO2 26 10/17/2016 1055   BUN 18.4 10/17/2016 1055   CREATININE 0.8 10/17/2016 1055      Component Value Date/Time   CALCIUM 9.9 10/17/2016 1055   ALKPHOS 77 10/17/2016 1055   AST 62 (H) 10/17/2016 1055   ALT 79 (H) 10/17/2016 1055   BILITOT 0.54 10/17/2016 1055       Lab Results  Component Value Date   WBC 8.1 10/17/2016   HGB 12.7 10/17/2016   HCT 38.2 10/17/2016   MCV 91.3 10/17/2016   PLT 276 10/17/2016   NEUTROABS 5.9 10/17/2016    ASSESSMENT & PLAN:  Malignant neoplasm of upper-outer quadrant of left female breast ([place]) 06/21/16: Left lumpectomy: IDC grade 3, 1.1 cm, 1/5 LN Positive with ECE, Margins Neg, LVI Present; Er 30%, PR 0%, Ki 67 40%, Her 2 Positive Ratio 2.71; T1CN1 (Stage 2B)  Patient has multiple comorbidities including history of LVH with congestive heart failure  Treatment Plan: 1. Adjuvant Taxol with Herceptin And Perjeta (Last  echocardiogram showed an ejection fraction of 55-60%-- repeat on 4/3/2018EF 60-65%) severe she can vegetables) 2. Followed by adjuvant radiation 3. Followed by adjuvant antiestrogen therapy with anastrozole 1 mg daily 5 years (bone density 04/29/2016 T score -1.2) ----------------------------------------------------------------------------------------------------------------------------------------------------- Current Treatment: Completed 12 cycles ofweeklyTaxol on 10/10/2016;Herceptin and Perjeta Q 3 weeks  Continue with Herceptin and Perjeta every 3 weeks Echocardiogram showed EF of 60-65% Radiation oncology appointment Return to clinic every 6 weeks for follow-up with me  Respiratory problems: Patient had sinus infection and is currently on antibiotics. She attributes all these symptoms of sleep apnea machine.  I spent 25 minutes talking to the patient of which more than half was spent in counseling and coordination of care.  No orders of the defined types were placed in this encounter.  The patient has a good understanding of the overall plan. she agrees with it. she will call with any problems that may develop before the next visit here.   Rulon Eisenmenger, MD 10/17/16

## 2016-10-18 ENCOUNTER — Other Ambulatory Visit: Payer: Self-pay

## 2016-10-18 ENCOUNTER — Encounter: Payer: Self-pay | Admitting: Family Medicine

## 2016-10-18 MED ORDER — METFORMIN HCL 500 MG PO TABS: 500.0000 mg | ORAL_TABLET | Freq: Every day | ORAL | 3 refills | Status: DC

## 2016-10-18 NOTE — Progress Notes (Signed)
Location of Breast Cancer: upper-outer quadrant of left female breast   Histology per Pathology Report:   06/21/16 Diagnosis 1. Breast, lumpectomy, Left - INVASIVE DUCTAL CARCINOMA, GRADE III/III, SPANNING 1.1 CM. - THE SURGICAL RESECTION MARGINS ARE NEGATIVE FOR CARCINOMA. - LYMPHOVASCULAR INVASION IS IDENTIFIED. - SEE ONCOLOGY TABLE BELOW. 2. Lymph node, sentinel, biopsy, Left axillary - METASTATIC CARCINOMA IN 1 OF 1 LYMPH NODE (1/1), WITH FOCAL EXTRACAPSULAR EXTENSION. 3. Lymph node, sentinel, biopsy, Left axillary - THERE IS NO EVIDENCE OF CARCINOMA IN 1 OF 1 LYMPH NODE (0/1). 4. Lymph node, sentinel, biopsy, Left axillary - THERE IS NO EVIDENCE OF CARCINOMA IN 1 OF 1 LYMPH NODE (0/1). 5. Lymph node, sentinel, biopsy, Left axillary - THERE IS NO EVIDENCE OF CARCINOMA IN 1 OF 1 LYMPH NODE (0/1). 6. Lymph node, sentinel, biopsy, Left axillary - THERE IS NO EVIDENCE OF CARCINOMA IN 1 OF 1 LYMPH NODE (0/1). 7. Lymph node, sentinel, biopsy, Left axillary - THERE IS NO EVIDENCE OF CARCINOMA IN 1 OF 1 LYMPH NODE (0/1).  05/13/16 Diagnosis Breast, left, needle core biopsy, 3:00 o'clock - INVASIVE DUCTAL CARCINOMA.  Receptor Status: ER(30%), PR (0%), Her2-neu (positive), Ki-(40%)  Did patient present with symptoms (if so, please note symptoms) or was this found on screening mammography?: screening mammogram  Past/Anticipated interventions by surgeon, if any: 06/21/16 - Procedure: LEFT BREAST LUMPECTOMY WITH RADIOACTIVE SEED AND SENTINEL LYMPH NODE BIOPSY, INJECT BLUE DYE LEFT BREAST;  Surgeon: Fanny Skates, MD  Past/Anticipated interventions by medical oncology, if any: Completed 12 cycles ofweeklyTaxol on 10/10/2016;Herceptin and Perjeta Q 3 weeks. Continue with Herceptin and Perjeta every 3 weeks. Adjuvant antiestrogen therapy with anastrozole 1 mg daily 5 years after radiation.  Lymphedema issues, if any:  no    Pain issues, if any: no  SAFETY ISSUES:  Prior radiation?  no  Pacemaker/ICD? no  Possible current pregnancy?no  Is the patient on methotrexate? no  Current Complaints / other details:    BP 133/88 (BP Location: Right Arm, Patient Position: Sitting)   Pulse 73   Temp 98.3 F (36.8 C) (Oral)   Ht '5\' 5"'  (1.651 m)   Wt (!) 336 lb 12.8 oz (152.8 kg)   SpO2 97%   BMI 56.05 kg/m    Wt Readings from Last 3 Encounters:  10/23/16 (!) 336 lb 12.8 oz (152.8 kg)  10/17/16 (!) 332 lb 14.4 oz (151 kg)  10/08/16 (!) 331 lb (150.1 kg)

## 2016-10-18 NOTE — Telephone Encounter (Signed)
error:[id number] ° °

## 2016-10-23 ENCOUNTER — Ambulatory Visit
Admission: RE | Admit: 2016-10-23 | Discharge: 2016-10-23 | Disposition: A | Discharge status: Home / Self Care | Payer: Medicare HMO | Source: Ambulatory Visit | Attending: Radiation Oncology | Admitting: Radiation Oncology

## 2016-10-23 ENCOUNTER — Other Ambulatory Visit (HOSPITAL_COMMUNITY): Payer: Medicare HMO

## 2016-10-23 ENCOUNTER — Encounter: Payer: Self-pay | Admitting: Radiation Oncology

## 2016-10-23 VITALS — BP 133/88 | HR 73 | Temp 98.3°F | Ht 65.0 in | Wt 336.8 lb

## 2016-10-23 DIAGNOSIS — C50412 Malignant neoplasm of upper-outer quadrant of left female breast: Secondary | ICD-10-CM | POA: Diagnosis not present

## 2016-10-23 DIAGNOSIS — Z79899 Other long term (current) drug therapy: Secondary | ICD-10-CM | POA: Insufficient documentation

## 2016-10-23 DIAGNOSIS — Z7951 Long term (current) use of inhaled steroids: Secondary | ICD-10-CM | POA: Diagnosis not present

## 2016-10-23 DIAGNOSIS — Z9221 Personal history of antineoplastic chemotherapy: Secondary | ICD-10-CM | POA: Diagnosis not present

## 2016-10-23 DIAGNOSIS — Z170 Estrogen receptor positive status [ER+]: Secondary | ICD-10-CM | POA: Diagnosis not present

## 2016-10-23 DIAGNOSIS — Z7982 Long term (current) use of aspirin: Secondary | ICD-10-CM | POA: Insufficient documentation

## 2016-10-23 DIAGNOSIS — C773 Secondary and unspecified malignant neoplasm of axilla and upper limb lymph nodes: Secondary | ICD-10-CM | POA: Diagnosis not present

## 2016-10-23 NOTE — Progress Notes (Signed)
Radiation Oncology         [phone] ________________________________  Name: [name] MRN: [id number]  Date: 10/23/2016  DOB: 1950/08/17  Follow-Up Visit Note  CC: Penni Homans, MD  Nicholas Lose, MD    ICD-9-CM ICD-10-CM   1. Malignant neoplasm of upper-outer quadrant of left breast in female, estrogen receptor positive ([place]) 174.4 C50.412    V86.0 Z17.0     Diagnosis:   Stage II-B (T1c, N1), grade 3, invasive ductal carcinoma of the left breast (ER+, PR-, HER2+)    Narrative:  The patient returns today for routine follow-up.  She has recently completed her adjuvant chemotherapy. Overall she tolerated this well. She will continue on Herceptin and pergeta maintenance therapy. She denies any pain within the left breast nipple discharge or bleeding. She denies any swelling in her left arm numbness or tingling. sHe denies any cough or breathing problems. She does report some nasal congestion related to pollen.  denies any new bony pain headaches dizziness or blurred vision.                            ALLERGIES:  is allergic to no known allergies.  Meds: Current Outpatient Prescriptions  Medication Sig Dispense Refill  . acetaminophen (TYLENOL) 500 MG tablet Take 1,000 mg by mouth 2 (two) times daily as needed for moderate pain or headache.    . albuterol (PROVENTIL HFA;VENTOLIN HFA) 108 (90 Base) MCG/ACT inhaler Inhale 2 puffs into the lungs every 6 (six) hours as needed for wheezing or shortness of breath. 1 Inhaler 6  . aspirin EC 81 MG tablet Take 81 mg by mouth at bedtime.     . beclomethasone (QVAR) 80 MCG/ACT inhaler Inhale 1-2 puffs into the lungs 2 (two) times daily. (Patient taking differently: Inhale 1-2 puffs into the lungs 2 (two) times daily as needed (shortness of breath). ) 1 Inhaler 5  . diclofenac (VOLTAREN) 75 MG EC tablet TAKE 1 TABLET TWICE DAILY 180 tablet 1  . diltiazem (CARDIZEM CD) 120 MG 24 hr capsule Take 1 capsule (120 mg total) by mouth daily. 90  capsule 1  . escitalopram (LEXAPRO) 20 MG tablet Take 1 tablet (20 mg total) by mouth daily. 90 tablet 1  . esomeprazole (NEXIUM) 40 MG capsule Take 40 mg by mouth daily.     . fluticasone (FLONASE) 50 MCG/ACT nasal spray Place 1 spray into both nostrils daily as needed for allergies. (Patient taking differently: Place 1 spray into both nostrils daily as needed (FOR SINUS ISSUE). ) 48 g 1  . furosemide (LASIX) 20 MG tablet Take 1 tablet (20 mg total) by mouth daily. 90 tablet 1  . Hypromellose (ARTIFICIAL TEARS OP) Apply 1 drop to eye daily as needed (dry eyes).    Marland Kitchen lidocaine-prilocaine (EMLA) cream Apply to affected area once 30 g 3  . losartan (COZAAR) 100 MG tablet Take 1 tablet (100 mg total) by mouth daily. 90 tablet 3  . metFORMIN (GLUCOPHAGE) 500 MG tablet Take 1 tablet (500 mg total) by mouth daily. 30 tablet 3  . montelukast (SINGULAIR) 10 MG tablet Take 1 tablet (10 mg total) by mouth at bedtime. 90 tablet 1  . nebivolol (BYSTOLIC) 10 MG tablet Take 1 tablet (10 mg total) by mouth daily. 90 tablet 1  . rosuvastatin (CRESTOR) 40 MG tablet Take 1 tablet (40 mg total) by mouth daily. (Patient taking differently: Take 40 mg by mouth at bedtime. ) 90  tablet 3  . clotrimazole-betamethasone (LOTRISONE) cream Apply 1 application topically 2 (two) times daily as needed (for rash). (Patient not taking: Reported on 10/23/2016) 30 g 0  . diphenoxylate-atropine (LOMOTIL) 2.5-0.025 MG tablet Take 1 tablet by mouth 4 (four) times daily as needed for diarrhea or loose stools. (Patient not taking: Reported on 10/23/2016) 60 tablet 0  . LORazepam (ATIVAN) 0.5 MG tablet Take 1 tablet (0.5 mg total) by mouth every 6 (six) hours as needed (Nausea or vomiting). (Patient not taking: Reported on 10/23/2016) 30 tablet 0  . ondansetron (ZOFRAN) 8 MG tablet Take 1 tablet (8 mg total) by mouth 2 (two) times daily as needed (Nausea or vomiting). (Patient not taking: Reported on 10/23/2016) 30 tablet 1  . prochlorperazine  (COMPAZINE) 10 MG tablet Take 1 tablet (10 mg total) by mouth every 6 (six) hours as needed (Nausea or vomiting). (Patient not taking: Reported on 10/23/2016) 30 tablet 3  . triamcinolone (KENALOG) 0.025 % ointment Apply 1 application topically 2 (two) times daily. (Patient not taking: Reported on 10/23/2016) 30 g 0   No current facility-administered medications for this encounter.     Physical Findings: The patient is in no acute distress. Patient is alert and oriented.  height is '5\' 5"'  (1.651 m) and weight is 336 lb 12.8 oz (152.8 kg) (abnormal). Her oral temperature is 98.3 F (36.8 C). Her blood pressure is 133/88 and her pulse is 73. Her oxygen saturation is 97%. .   No supraclavicular or axillary adenopathy. The lungs are clear to auscultation. The heart has regular rhythm and rate. Examination of the right breast reveals no mass or nipple discharge. Examination left breast reveals some mild edema in the nipple areolar complex area. No dominant masses appreciated within the breast. The laterally placed lumpectomy scar is well-healed without signs of drainage or infection. The patient's axillary scar is also healed well.  Lab Findings: Lab Results  Component Value Date   WBC 8.1 10/17/2016   HGB 12.7 10/17/2016   HCT 38.2 10/17/2016   MCV 91.3 10/17/2016   PLT 276 10/17/2016    Radiographic Findings: No results found.  Impression: Patient is ready to proceed with adjuvant radiation therapy. Given the findings of node positivity on her axillary sentinel node procedure would recommend full coverage of axillary region with her breast radiation treatments. Patient will receive approximately 6 and half weeks of radiation therapy. Patient is not a candidate for hypo-fractionated treatment given the coverage of axillary region as well as the patient's body size.  Plan:  Patient will proceed with simulation and treatment later this morning. She will begin her treatments in approximately a week  to 10 days. This will be adequate time for recovery from her recent completion of adjuvant chemotherapy.  ____________________________________ Gery Pray, MD

## 2016-10-23 NOTE — Progress Notes (Signed)
Please see the Nurse Progress Note in the MD Initial Consult Encounter for this patient. 

## 2016-10-29 DIAGNOSIS — C50412 Malignant neoplasm of upper-outer quadrant of left female breast: Secondary | ICD-10-CM | POA: Diagnosis not present

## 2016-10-29 DIAGNOSIS — Z7982 Long term (current) use of aspirin: Secondary | ICD-10-CM | POA: Diagnosis not present

## 2016-10-29 DIAGNOSIS — Z7951 Long term (current) use of inhaled steroids: Secondary | ICD-10-CM | POA: Diagnosis not present

## 2016-10-29 DIAGNOSIS — Z170 Estrogen receptor positive status [ER+]: Secondary | ICD-10-CM | POA: Diagnosis not present

## 2016-10-29 DIAGNOSIS — Z79899 Other long term (current) drug therapy: Secondary | ICD-10-CM | POA: Diagnosis not present

## 2016-10-29 NOTE — Addendum Note (Signed)
Encounter addended by: Gery Pray, MD on: 10/29/2016 10:49 AM<BR>    Actions taken: Problem List reviewed, Sign clinical note

## 2016-10-29 NOTE — Progress Notes (Signed)
  Radiation Oncology         [phone] ________________________________  Name: [name] MRN: [id number]  Date: 10/23/2016  DOB: April 25, 1951  SIMULATION AND TREATMENT PLANNING NOTE    ICD-9-CM ICD-10-CM   1. Malignant neoplasm of upper-outer quadrant of left breast in female, estrogen receptor positive ([place]) 174.4 C50.412    V86.0 Z17.0     DIAGNOSIS:  Stage II-B (T1c, N1), grade 3, invasive ductal carcinoma of the left breast (ER+, PR-, HER2+)  NARRATIVE:  The patient was brought to the [place].  Identity was confirmed.  All relevant records and images related to the planned course of therapy were reviewed.  The patient freely provided informed written consent to proceed with treatment after reviewing the details related to the planned course of therapy. The consent form was witnessed and verified by the simulation staff.  Then, the patient was set-up in a stable reproducible  supine position for radiation therapy.  CT images were obtained.  Surface markings were placed.  The CT images were loaded into the planning software.  Then the target and avoidance structures were contoured.  Treatment planning then occurred.  The radiation prescription was entered and confirmed.  Then, I designed and supervised the construction of a total of 5 medically necessary complex treatment devices.  I have requested : 3D Simulation  I have requested a DVH of the following structures: heart, lungs, CTV, .  I have ordered:CBC  PLAN:  The patient will receive 45 Gy in 25 fractions Using a 4 field set up covering the left breast axillary and supraclavicular region. The patient will then proceed with a boost directed at the lumpectomy cavity and continue for 8 fractions at 2 gray per fraction for a boost dose of 16 gray and a cumulative dose of 61 gray to the lumpectomy cavity.  -----------------------------------  Blair Promise, PhD, MD

## 2016-10-30 ENCOUNTER — Ambulatory Visit
Admission: RE | Admit: 2016-10-30 | Discharge: 2016-10-30 | Disposition: A | Discharge status: Home / Self Care | Payer: Medicare HMO | Source: Ambulatory Visit | Attending: Radiation Oncology | Admitting: Radiation Oncology

## 2016-10-30 DIAGNOSIS — Z79899 Other long term (current) drug therapy: Secondary | ICD-10-CM | POA: Diagnosis not present

## 2016-10-30 DIAGNOSIS — C50412 Malignant neoplasm of upper-outer quadrant of left female breast: Secondary | ICD-10-CM

## 2016-10-30 DIAGNOSIS — Z7951 Long term (current) use of inhaled steroids: Secondary | ICD-10-CM | POA: Diagnosis not present

## 2016-10-30 DIAGNOSIS — Z170 Estrogen receptor positive status [ER+]: Secondary | ICD-10-CM | POA: Diagnosis not present

## 2016-10-30 DIAGNOSIS — Z7982 Long term (current) use of aspirin: Secondary | ICD-10-CM | POA: Diagnosis not present

## 2016-10-30 NOTE — Progress Notes (Signed)
  Radiation Oncology         [phone] ________________________________  Name: [name] MRN: [id number]  Date: 10/30/2016  DOB: 06/09/1951  Simulation Verification Note    ICD-9-CM ICD-10-CM   1. Malignant neoplasm of upper-outer quadrant of left breast in female, estrogen receptor positive ([place]) 174.4 C50.412    V86.0 Z17.0     Status: outpatient  NARRATIVE: The patient was brought to the treatment unit and placed in the planned treatment position. The clinical setup was verified. Then port films were obtained and uploaded to the radiation oncology medical record software.  The treatment beams were carefully compared against the planned radiation fields. The position location and shape of the radiation fields was reviewed. They targeted volume of tissue appears to be appropriately covered by the radiation beams. Organs at risk appear to be excluded as planned.  Based on my personal review, I approved the simulation verification. The patient's treatment will proceed as planned.  -----------------------------------  Blair Promise, PhD, MD

## 2016-10-31 ENCOUNTER — Ambulatory Visit
Admission: RE | Admit: 2016-10-31 | Discharge: 2016-10-31 | Disposition: A | Discharge status: Home / Self Care | Payer: Medicare HMO | Source: Ambulatory Visit | Attending: Radiation Oncology | Admitting: Radiation Oncology

## 2016-10-31 DIAGNOSIS — Z79899 Other long term (current) drug therapy: Secondary | ICD-10-CM | POA: Diagnosis not present

## 2016-10-31 DIAGNOSIS — C50412 Malignant neoplasm of upper-outer quadrant of left female breast: Secondary | ICD-10-CM | POA: Diagnosis not present

## 2016-10-31 DIAGNOSIS — Z7951 Long term (current) use of inhaled steroids: Secondary | ICD-10-CM | POA: Diagnosis not present

## 2016-10-31 DIAGNOSIS — Z7982 Long term (current) use of aspirin: Secondary | ICD-10-CM | POA: Diagnosis not present

## 2016-10-31 DIAGNOSIS — Z170 Estrogen receptor positive status [ER+]: Secondary | ICD-10-CM | POA: Diagnosis not present

## 2016-11-01 ENCOUNTER — Ambulatory Visit
Admission: RE | Admit: 2016-11-01 | Discharge: 2016-11-01 | Disposition: A | Discharge status: Home / Self Care | Payer: Medicare HMO | Source: Ambulatory Visit | Attending: Radiation Oncology | Admitting: Radiation Oncology

## 2016-11-01 ENCOUNTER — Inpatient Hospital Stay
Admission: RE | Admit: 2016-11-01 | Discharge: 2016-11-01 | Disposition: A | Discharge status: Home / Self Care | Payer: Self-pay | Source: Ambulatory Visit | Attending: Radiation Oncology | Admitting: Radiation Oncology

## 2016-11-01 DIAGNOSIS — Z170 Estrogen receptor positive status [ER+]: Principal | ICD-10-CM

## 2016-11-01 DIAGNOSIS — Z7951 Long term (current) use of inhaled steroids: Secondary | ICD-10-CM | POA: Diagnosis not present

## 2016-11-01 DIAGNOSIS — C50412 Malignant neoplasm of upper-outer quadrant of left female breast: Secondary | ICD-10-CM

## 2016-11-01 DIAGNOSIS — Z7982 Long term (current) use of aspirin: Secondary | ICD-10-CM | POA: Diagnosis not present

## 2016-11-01 DIAGNOSIS — Z79899 Other long term (current) drug therapy: Secondary | ICD-10-CM | POA: Diagnosis not present

## 2016-11-01 MED ORDER — ALRA NON-METALLIC DEODORANT (RAD-ONC)
1.0000 "application " | Freq: Once | TOPICAL | Status: AC
  Administered 2016-11-01: 1 via TOPICAL

## 2016-11-01 MED ORDER — RADIAPLEXRX EX GEL
Freq: Once | CUTANEOUS | Status: AC
  Administered 2016-11-01: 12:00:00 via TOPICAL

## 2016-11-01 NOTE — Progress Notes (Signed)
Pt here for patient teaching.  Pt given Radiation and You booklet, skin care instructions, Alra deodorant and Radiaplex gel. Reviewed areas of pertinence such as fatigue and skin changes . Pt able to give teach back of to pat skin and use unscented/gentle soap,apply Radiaplex bid and avoid applying anything to skin within 4 hours of treatment. Pt demonstrated understanding and verbalizes understanding of information given and will contact nursing with any questions or concerns.        

## 2016-11-04 ENCOUNTER — Ambulatory Visit
Admission: RE | Admit: 2016-11-04 | Discharge: 2016-11-04 | Disposition: A | Discharge status: Home / Self Care | Payer: Medicare HMO | Source: Ambulatory Visit | Attending: Radiation Oncology | Admitting: Radiation Oncology

## 2016-11-04 DIAGNOSIS — Z7982 Long term (current) use of aspirin: Secondary | ICD-10-CM | POA: Diagnosis not present

## 2016-11-04 DIAGNOSIS — Z7951 Long term (current) use of inhaled steroids: Secondary | ICD-10-CM | POA: Diagnosis not present

## 2016-11-04 DIAGNOSIS — Z170 Estrogen receptor positive status [ER+]: Secondary | ICD-10-CM | POA: Diagnosis not present

## 2016-11-04 DIAGNOSIS — Z79899 Other long term (current) drug therapy: Secondary | ICD-10-CM | POA: Diagnosis not present

## 2016-11-04 DIAGNOSIS — C50412 Malignant neoplasm of upper-outer quadrant of left female breast: Secondary | ICD-10-CM | POA: Diagnosis not present

## 2016-11-05 ENCOUNTER — Ambulatory Visit
Admission: RE | Admit: 2016-11-05 | Discharge: 2016-11-05 | Disposition: A | Discharge status: Home / Self Care | Payer: Medicare HMO | Source: Ambulatory Visit | Attending: Radiation Oncology | Admitting: Radiation Oncology

## 2016-11-05 DIAGNOSIS — Z170 Estrogen receptor positive status [ER+]: Secondary | ICD-10-CM | POA: Diagnosis not present

## 2016-11-05 DIAGNOSIS — C50412 Malignant neoplasm of upper-outer quadrant of left female breast: Secondary | ICD-10-CM | POA: Diagnosis not present

## 2016-11-05 DIAGNOSIS — Z7982 Long term (current) use of aspirin: Secondary | ICD-10-CM | POA: Diagnosis not present

## 2016-11-05 DIAGNOSIS — Z7951 Long term (current) use of inhaled steroids: Secondary | ICD-10-CM | POA: Diagnosis not present

## 2016-11-05 DIAGNOSIS — Z79899 Other long term (current) drug therapy: Secondary | ICD-10-CM | POA: Diagnosis not present

## 2016-11-06 ENCOUNTER — Ambulatory Visit
Admission: RE | Admit: 2016-11-06 | Discharge: 2016-11-06 | Disposition: A | Discharge status: Home / Self Care | Payer: Medicare HMO | Source: Ambulatory Visit | Attending: Radiation Oncology | Admitting: Radiation Oncology

## 2016-11-06 DIAGNOSIS — Z7982 Long term (current) use of aspirin: Secondary | ICD-10-CM | POA: Diagnosis not present

## 2016-11-06 DIAGNOSIS — Z7951 Long term (current) use of inhaled steroids: Secondary | ICD-10-CM | POA: Diagnosis not present

## 2016-11-06 DIAGNOSIS — Z79899 Other long term (current) drug therapy: Secondary | ICD-10-CM | POA: Diagnosis not present

## 2016-11-06 DIAGNOSIS — C50412 Malignant neoplasm of upper-outer quadrant of left female breast: Secondary | ICD-10-CM | POA: Diagnosis not present

## 2016-11-06 DIAGNOSIS — Z170 Estrogen receptor positive status [ER+]: Secondary | ICD-10-CM | POA: Diagnosis not present

## 2016-11-07 ENCOUNTER — Ambulatory Visit
Admission: RE | Admit: 2016-11-07 | Discharge: 2016-11-07 | Disposition: A | Discharge status: Home / Self Care | Payer: Medicare HMO | Source: Ambulatory Visit | Attending: Radiation Oncology | Admitting: Radiation Oncology

## 2016-11-07 ENCOUNTER — Other Ambulatory Visit: Payer: Medicare HMO

## 2016-11-07 ENCOUNTER — Ambulatory Visit (HOSPITAL_BASED_OUTPATIENT_CLINIC_OR_DEPARTMENT_OTHER): Payer: Medicare HMO

## 2016-11-07 VITALS — BP 161/86 | HR 59 | Temp 98.6°F | Resp 18

## 2016-11-07 DIAGNOSIS — Z5112 Encounter for antineoplastic immunotherapy: Secondary | ICD-10-CM | POA: Diagnosis not present

## 2016-11-07 DIAGNOSIS — Z170 Estrogen receptor positive status [ER+]: Secondary | ICD-10-CM | POA: Diagnosis not present

## 2016-11-07 DIAGNOSIS — Z79899 Other long term (current) drug therapy: Secondary | ICD-10-CM | POA: Diagnosis not present

## 2016-11-07 DIAGNOSIS — Z7982 Long term (current) use of aspirin: Secondary | ICD-10-CM | POA: Diagnosis not present

## 2016-11-07 DIAGNOSIS — C50412 Malignant neoplasm of upper-outer quadrant of left female breast: Secondary | ICD-10-CM | POA: Diagnosis not present

## 2016-11-07 DIAGNOSIS — Z7951 Long term (current) use of inhaled steroids: Secondary | ICD-10-CM | POA: Diagnosis not present

## 2016-11-07 MED ORDER — SODIUM CHLORIDE 0.9 % IV SOLN
Freq: Once | INTRAVENOUS | Status: AC
  Administered 2016-11-07: 10:00:00 via INTRAVENOUS

## 2016-11-07 MED ORDER — DIPHENHYDRAMINE HCL 25 MG PO CAPS
ORAL_CAPSULE | ORAL | Status: AC
  Filled 2016-11-07: qty 2

## 2016-11-07 MED ORDER — TRASTUZUMAB CHEMO 150 MG IV SOLR: 6.0000 mg/kg | Freq: Once | INTRAVENOUS | Status: DC

## 2016-11-07 MED ORDER — SODIUM CHLORIDE 0.9 % IV SOLN
420.0000 mg | Freq: Once | INTRAVENOUS | Status: AC
  Administered 2016-11-07: 420 mg via INTRAVENOUS
  Filled 2016-11-07: qty 14

## 2016-11-07 MED ORDER — DIPHENHYDRAMINE HCL 25 MG PO CAPS
50.0000 mg | ORAL_CAPSULE | Freq: Once | ORAL | Status: AC
  Administered 2016-11-07: 50 mg via ORAL

## 2016-11-07 MED ORDER — ACETAMINOPHEN 325 MG PO TABS
650.0000 mg | ORAL_TABLET | Freq: Once | ORAL | Status: AC
  Administered 2016-11-07: 650 mg via ORAL

## 2016-11-07 MED ORDER — SODIUM CHLORIDE 0.9 % IV SOLN
900.0000 mg | Freq: Once | INTRAVENOUS | Status: AC
  Administered 2016-11-07: 900 mg via INTRAVENOUS
  Filled 2016-11-07: qty 42.86

## 2016-11-07 MED ORDER — SODIUM CHLORIDE 0.9% FLUSH
10.0000 mL | INTRAVENOUS | Status: DC | PRN
  Administered 2016-11-07: 10 mL
  Filled 2016-11-07: qty 10

## 2016-11-07 MED ORDER — ACETAMINOPHEN 325 MG PO TABS
ORAL_TABLET | ORAL | Status: AC
  Filled 2016-11-07: qty 2

## 2016-11-07 MED ORDER — HEPARIN SOD (PORK) LOCK FLUSH 100 UNIT/ML IV SOLN
500.0000 [IU] | Freq: Once | INTRAVENOUS | Status: AC | PRN
  Administered 2016-11-07: 500 [IU]
  Filled 2016-11-07: qty 5

## 2016-11-07 NOTE — Patient Instructions (Signed)
[facility] Discharge Instructions for Patients Receiving Chemotherapy  Today you received the following chemotherapy agents Herceptin/Perjeta To help prevent nausea and vomiting after your treatment, we encourage you to take your nausea medication as prescribed.   If you develop nausea and vomiting that is not controlled by your nausea medication, call the clinic.   BELOW ARE SYMPTOMS THAT SHOULD BE REPORTED IMMEDIATELY:  *FEVER GREATER THAN 100.5 F  *CHILLS WITH OR WITHOUT FEVER  NAUSEA AND VOMITING THAT IS NOT CONTROLLED WITH YOUR NAUSEA MEDICATION  *UNUSUAL SHORTNESS OF BREATH  *UNUSUAL BRUISING OR BLEEDING  TENDERNESS IN MOUTH AND THROAT WITH OR WITHOUT PRESENCE OF ULCERS  *URINARY PROBLEMS  *BOWEL PROBLEMS  UNUSUAL RASH Items with * indicate a potential emergency and should be followed up as soon as possible.  Feel free to call the clinic you have any questions or concerns. The clinic phone number is [phone].  Please show the CHEMO ALERT CARD at check-in to the Emergency Department and triage nurse.  

## 2016-11-08 ENCOUNTER — Ambulatory Visit
Admission: RE | Admit: 2016-11-08 | Discharge: 2016-11-08 | Disposition: A | Discharge status: Home / Self Care | Payer: Medicare HMO | Source: Ambulatory Visit | Attending: Radiation Oncology | Admitting: Radiation Oncology

## 2016-11-08 DIAGNOSIS — C50412 Malignant neoplasm of upper-outer quadrant of left female breast: Secondary | ICD-10-CM | POA: Diagnosis not present

## 2016-11-08 DIAGNOSIS — Z7951 Long term (current) use of inhaled steroids: Secondary | ICD-10-CM | POA: Diagnosis not present

## 2016-11-08 DIAGNOSIS — Z7982 Long term (current) use of aspirin: Secondary | ICD-10-CM | POA: Diagnosis not present

## 2016-11-08 DIAGNOSIS — Z170 Estrogen receptor positive status [ER+]: Secondary | ICD-10-CM | POA: Diagnosis not present

## 2016-11-08 DIAGNOSIS — Z79899 Other long term (current) drug therapy: Secondary | ICD-10-CM | POA: Diagnosis not present

## 2016-11-11 ENCOUNTER — Ambulatory Visit
Admission: RE | Admit: 2016-11-11 | Discharge: 2016-11-11 | Disposition: A | Discharge status: Home / Self Care | Payer: Medicare HMO | Source: Ambulatory Visit | Attending: Radiation Oncology | Admitting: Radiation Oncology

## 2016-11-11 ENCOUNTER — Other Ambulatory Visit: Payer: Self-pay | Admitting: Family Medicine

## 2016-11-11 ENCOUNTER — Telehealth: Payer: Self-pay | Admitting: Family Medicine

## 2016-11-11 DIAGNOSIS — C50412 Malignant neoplasm of upper-outer quadrant of left female breast: Secondary | ICD-10-CM | POA: Diagnosis not present

## 2016-11-11 DIAGNOSIS — Z7982 Long term (current) use of aspirin: Secondary | ICD-10-CM | POA: Diagnosis not present

## 2016-11-11 DIAGNOSIS — Z7951 Long term (current) use of inhaled steroids: Secondary | ICD-10-CM | POA: Diagnosis not present

## 2016-11-11 DIAGNOSIS — Z170 Estrogen receptor positive status [ER+]: Secondary | ICD-10-CM | POA: Diagnosis not present

## 2016-11-11 DIAGNOSIS — Z79899 Other long term (current) drug therapy: Secondary | ICD-10-CM | POA: Diagnosis not present

## 2016-11-11 DIAGNOSIS — R0602 Shortness of breath: Secondary | ICD-10-CM

## 2016-11-11 NOTE — Telephone Encounter (Signed)
Caller name:Chelsy Tomasita Crumble  Relationship to patient: Can be reached:[phone] Pharmacy:  Reason for call:Would like to go ahead and be referred to see Dr Halford Chessman. States this has been spoken about before. Believes she may of had a asthma attack. Please advise

## 2016-11-12 ENCOUNTER — Telehealth: Payer: Self-pay | Admitting: *Deleted

## 2016-11-12 ENCOUNTER — Ambulatory Visit
Admission: RE | Admit: 2016-11-12 | Discharge: 2016-11-12 | Disposition: A | Discharge status: Home / Self Care | Payer: Medicare HMO | Source: Ambulatory Visit | Attending: Radiation Oncology | Admitting: Radiation Oncology

## 2016-11-12 ENCOUNTER — Ambulatory Visit (HOSPITAL_BASED_OUTPATIENT_CLINIC_OR_DEPARTMENT_OTHER): Payer: Medicare HMO | Admitting: Adult Health

## 2016-11-12 ENCOUNTER — Ambulatory Visit (HOSPITAL_COMMUNITY)
Admission: RE | Admit: 2016-11-12 | Discharge: 2016-11-12 | Disposition: A | Discharge status: Home / Self Care | Payer: Medicare HMO | Source: Ambulatory Visit | Attending: Adult Health | Admitting: Adult Health

## 2016-11-12 ENCOUNTER — Encounter: Payer: Self-pay | Admitting: Adult Health

## 2016-11-12 ENCOUNTER — Ambulatory Visit (HOSPITAL_BASED_OUTPATIENT_CLINIC_OR_DEPARTMENT_OTHER): Payer: Medicare HMO

## 2016-11-12 ENCOUNTER — Other Ambulatory Visit: Payer: Self-pay

## 2016-11-12 VITALS — BP 148/83 | HR 70 | Temp 98.9°F | Ht 65.0 in | Wt 337.0 lb

## 2016-11-12 VITALS — BP 141/78 | HR 72 | Temp 98.2°F | Resp 20 | Ht 65.0 in | Wt 337.0 lb

## 2016-11-12 DIAGNOSIS — C50412 Malignant neoplasm of upper-outer quadrant of left female breast: Secondary | ICD-10-CM

## 2016-11-12 DIAGNOSIS — Z170 Estrogen receptor positive status [ER+]: Secondary | ICD-10-CM | POA: Diagnosis not present

## 2016-11-12 DIAGNOSIS — J3089 Other allergic rhinitis: Secondary | ICD-10-CM

## 2016-11-12 DIAGNOSIS — Z79899 Other long term (current) drug therapy: Secondary | ICD-10-CM | POA: Diagnosis not present

## 2016-11-12 DIAGNOSIS — Z7951 Long term (current) use of inhaled steroids: Secondary | ICD-10-CM | POA: Diagnosis not present

## 2016-11-12 DIAGNOSIS — R0602 Shortness of breath: Secondary | ICD-10-CM

## 2016-11-12 DIAGNOSIS — E119 Type 2 diabetes mellitus without complications: Secondary | ICD-10-CM | POA: Diagnosis not present

## 2016-11-12 DIAGNOSIS — J45909 Unspecified asthma, uncomplicated: Secondary | ICD-10-CM | POA: Diagnosis not present

## 2016-11-12 DIAGNOSIS — R05 Cough: Secondary | ICD-10-CM | POA: Diagnosis not present

## 2016-11-12 DIAGNOSIS — Z7982 Long term (current) use of aspirin: Secondary | ICD-10-CM | POA: Diagnosis not present

## 2016-11-12 LAB — COMPREHENSIVE METABOLIC PANEL
ALT: 54 U/L (ref 0–55)
AST: 56 U/L — AB (ref 5–34)
Albumin: 3.7 g/dL (ref 3.5–5.0)
Alkaline Phosphatase: 94 U/L (ref 40–150)
Anion Gap: 9 mEq/L (ref 3–11)
BUN: 12.8 mg/dL (ref 7.0–26.0)
CHLORIDE: 105 meq/L (ref 98–109)
CO2: 27 meq/L (ref 22–29)
Calcium: 10.1 mg/dL (ref 8.4–10.4)
Creatinine: 0.8 mg/dL (ref 0.6–1.1)
EGFR: 78 mL/min/{1.73_m2} — ABNORMAL LOW (ref >90)
GLUCOSE: 130 mg/dL (ref 70–140)
Potassium: 4.4 mEq/L (ref 3.5–5.1)
SODIUM: 141 meq/L (ref 136–145)
Total Bilirubin: 0.56 mg/dL (ref 0.20–1.20)
Total Protein: 7.1 g/dL (ref 6.4–8.3)

## 2016-11-12 LAB — CBC WITH DIFFERENTIAL/PLATELET
BASO%: 0.3 % (ref 0.0–2.0)
Basophils Absolute: 0 10*3/uL (ref 0.0–0.1)
EOS%: 0.7 % (ref 0.0–7.0)
Eosinophils Absolute: 0.1 10*3/uL (ref 0.0–0.5)
HCT: 38.3 % (ref 34.8–46.6)
HGB: 12.5 g/dL (ref 11.6–15.9)
LYMPH%: 22.3 % (ref 14.0–49.7)
MCH: 30 pg (ref 25.1–34.0)
MCHC: 32.6 g/dL (ref 31.5–36.0)
MCV: 92 fL (ref 79.5–101.0)
MONO#: 0.5 10*3/uL (ref 0.1–0.9)
MONO%: 7.3 % (ref 0.0–14.0)
NEUT#: 5.2 10*3/uL (ref 1.5–6.5)
NEUT%: 69.4 % (ref 38.4–76.8)
Platelets: 286 10*3/uL (ref 145–400)
RBC: 4.17 10*6/uL (ref 3.70–5.45)
RDW: 17.4 % — ABNORMAL HIGH (ref 11.2–14.5)
WBC: 7.5 10*3/uL (ref 3.9–10.3)
lymph#: 1.7 10*3/uL (ref 0.9–3.3)

## 2016-11-12 MED ORDER — AZELASTINE & FLUTICASONE 137 & 50 MCG/ACT NA THPK: 1.0000 | PACK | Freq: Every day | NASAL | 5 refills | Status: DC

## 2016-11-12 NOTE — Telephone Encounter (Signed)
Called pt and will be coming in to see [name]. Obtained cxr/labs prior to appt. Pt confirmed appt verbally over the phone.

## 2016-11-12 NOTE — Telephone Encounter (Signed)
Voice mail: "I was there yesterday and had what I believe was an asthma attack after my radiation appointment.  I'd like to be seen to find out why I'm having trouble breathing."  Call: "I am struggling to breath.  It was very hard trying to hold my breath yesterday for radiation.  After the radiation appointment my arms were beet red.  I couldn't get on the elevator but had to sit in the chair five to ten minutes to get my breath.  I always wheeze.  I blow my green stuff through my nose.  Dry cough.  I used my albuterol inhaler yesterday with no help.  I use Qvar daily and take Singulair.  I want to see the Nurse Practitioner to see what's going on."  RT scheduled at 11:45, Dr. Sondra Come at 12:00.

## 2016-11-12 NOTE — Progress Notes (Signed)
  Radiation Oncology         [phone] ________________________________  Name: [name] MRN: [id number]  Date: 11/12/2016  DOB: Jun 02, 1951  Weekly Radiation Therapy Management    ICD-9-CM ICD-10-CM   1. Malignant neoplasm of upper-outer quadrant of left breast in female, estrogen receptor positive (HCC) 174.4 C50.412    V86.0 Z17.0      Current Dose: 16.2 Gy     Planned Dose:  59.4 Gy  Narrative . . . . . . . . The patient presents for routine under treatment assessment.                                   Patient is having breathing issues as noted above in the nursing notes. Patient will see medical oncology later today for blood work and will undergo a chest x-ray. The patient sounds like she has upper airway congestion and nasal congestion which may be contributing to her symptoms.                                 Set-up films were reviewed.                                 The chart was checked. Physical Findings. . .  height is 5\' 5"  (1.651 m) and weight is 337 lb (152.9 kg) (abnormal). Her oral temperature is 98.9 F (37.2 C). Her blood pressure is 148/83 (abnormal) and her pulse is 70. Her oxygen saturation is 97%. . Patient has a lot of upper airway congestion on exam. The heart has regular rhythm and rate. The left breast and supraclavicular/axillary region shows some mild erythema with her treatments.  Impression . . . . . . . The patient is tolerating radiation. Plan . . . . . . . . . . . . Continue treatment as planned.  ________________________________   Blair Promise, PhD, MD

## 2016-11-12 NOTE — Progress Notes (Signed)
Called pt to let her know that she may come after her radiation tx today. Ordered chest xray, per Lindsey,NP, labs and to see np at 130pm. Pt appreciative of call & confirmed time and date today.

## 2016-11-12 NOTE — Progress Notes (Signed)
[name] has completed 9 fractions to her left breast.  She denies having pain.  She said yesterday she was feeling short of breath.  She said she was not able to hold her breath during treatment and after treatment, had to sit for 5 minutes before she was able to get on the elevator.  She reports that the skin on her arms was bright red and she used her rescue inhaler without relief.  She took ativan today before treatment and said that helped.  She was able to hold her breath today. She reports feeling short of breath before she started chemotherapy.  She reports that now she can walk from room to room in her house and becomes out of breath.  She will see the Charlestine Massed, NP and will have labs and chest x ray done.  She also reports having sinus drainage and pressure.    She is using radiaplex as directed.  The skin on her left breast is red.  BP (!) 148/83 (BP Location: Right Wrist, Patient Position: Sitting)   Pulse 70   Temp 98.9 F (37.2 C) (Oral)   Ht 5\' 5"  (1.651 m)   Wt (!) 337 lb (152.9 kg)   SpO2 97%   BMI 56.08 kg/m    Wt Readings from Last 3 Encounters:  11/12/16 (!) 337 lb (152.9 kg)  10/23/16 (!) 336 lb 12.8 oz (152.8 kg)  10/17/16 (!) 332 lb 14.4 oz (151 kg)

## 2016-11-12 NOTE — Progress Notes (Signed)
[facility] Follow up:    [name], MD [place] [id number]   DIAGNOSIS: Cancer Staging Malignant neoplasm of upper-outer quadrant of left female breast [facility]) Staging form: Breast, AJCC 7th Edition - Clinical stage from 05/22/2016: Stage IA (T1b, N0, M0) - Unsigned Staging comments: Staged at breast conference on 11.29.17   SUMMARY OF ONCOLOGIC HISTORY:   Malignant neoplasm of upper-outer quadrant of left female breast ([place])   05/13/2016 Initial Diagnosis    Left breast biopsy 3:00: IDC, grade 3, ER 30%, PR 0%, Ki-67 40%, HER-2 positive ratio 2.71, screening detected left breast mass 9 x 7 x 6 mm, axilla negative, T1 BN 0 stage IA clinical stage      06/21/2016 Surgery    Left lumpectomy: IDC grade 3, 1.1 cm, 1/5 LN Positive with ECE, Margins Neg, LVI Present; Er 30%, PR 0%, Ki 67 40%, Her 2 Positive Ratio 2.71; T1CN1 (Stage 2B)      07/25/2016 -  Chemotherapy    Taxol weekly 12; Herceptin and Perjeta every 3 weeks        CURRENT THERAPY:Herceptin/Perjeta  INTERVAL HISTORY: [name] 66 y.o. female returns for an urgent evaluation of shortness of breath.  She says that she has had shortness of breath prior to starting chemotherapy, however it has slowly worsened.  She has had increased dyspnea on exertion.  She says that she has had difficulty with holding her breath for 20-30 seconds for them to do her treatment.  She says that she has had to sit down to take breaks after walking short distances just to catch her breath.  She does have ? H/o asthma as diagnosed by her PCP, and uses a CPAP at night, however has been using this more since her treatment.  She is up 5 pounds since her last appt on 10/17/2016.  She is also a newly diagnosed diabetic.     Patient Active Problem List   Diagnosis Date Noted  . Insomnia 10/08/2016  . Dilated aortic root ([place])   . Skin rash 08/08/2016  . Port catheter in place  08/01/2016  . Malignant neoplasm of upper-outer quadrant of left female breast ([place]) 05/14/2016  . PVC's (premature ventricular contractions) 05/02/2016  . Neck mass 04/21/2016  . LVH (left ventricular hypertrophy) due to hypertensive disease, with heart failure ([place]) 04/18/2016  . Tonsillar hypertrophy 07/07/2015  . Back pain 10/02/2014  . Obesity 07/11/2014  . S/P right TKA 05/10/2014  . S/P knee replacement 05/10/2014  . Atrial tachycardia, paroxysmal ([place]) 02/22/2014  . Hyperglycemia 12/31/2013  . Acute sinusitis with symptoms > 10 days 12/20/2013  . Chronic diastolic CHF (congestive heart failure) ([place]) 11/22/2013  . CAD (coronary artery disease), native coronary artery 11/22/2013  . SOB (shortness of breath) 11/03/2013  . Undiagnosed cardiac murmurs 09/05/2013  . Preventative health care 09/05/2013  . Tinea corporis 02/23/2013  . Musculoskeletal pain 11/10/2012  . Vasomotor rhinitis 04/05/2012  . Obstructive sleep apnea 04/05/2012  . Anemia 04/05/2012  . Elevated LFTs 04/05/2012  . OA (osteoarthritis) of knee 04/05/2012  . Hernia 10/13/2010  . Hyperlipidemia 09/11/2007  . Anxiety and depression 09/11/2007  . Essential hypertension 09/11/2007  . GERD 09/11/2007  . RENAL CALCULUS 09/11/2007  . RENAL CYST 09/11/2007  . COLONIC POLYPS, HX OF 09/11/2007    is allergic to no known allergies.  MEDICAL HISTORY: Past Medical History:  Diagnosis Date  . Anemia 04/05/2012  . Anxiety   .  Anxiety state 09/11/2007   Qualifier: Diagnosis of  By: Scherrie Gerlach    . Atrial tachycardia, paroxysmal ([place])   . Back pain 10/02/2014  . Chicken pox as a child  . Chronic diastolic CHF (congestive heart failure), NYHA class 1 ([place])   . Complication of anesthesia    pt states feels different in her body after anesthesia when waking up and also experiences a smell of burnt plastic for approx a wk   . Dilated aortic root ([place])    78m by echo 09/2016  . Elevated LFTs 04/05/2012  . GERD  (gastroesophageal reflux disease)   . Goiter   . Heart murmur    hx of one at birth   . History of kidney stones   . Hx of colonic polyps   . Hyperlipidemia   . Hypertension   . Insomnia 10/08/2016  . Malignant neoplasm of upper-outer quadrant of left female breast ([place] 05/14/2016   not with patient  . Measles as a child  . Mumps as a child  . OA (osteoarthritis) of knee 04/05/2012  . Obesity 07/11/2014  . PONV (postoperative nausea and vomiting)   . Preventative health care 09/05/2013  . PVC's (premature ventricular contractions) 05/02/2016  . Shortness of breath dyspnea    walking distances / climbing stairs  . Sleep apnea 04/05/2012  . Tinea corporis 02/23/2013  . Vasomotor rhinitis 04/05/2012  . Ventral hernia     SURGICAL HISTORY: Past Surgical History:  Procedure Laterality Date  . BREAST LUMPECTOMY WITH RADIOACTIVE SEED AND SENTINEL LYMPH NODE BIOPSY Left 06/21/2016   Procedure: LEFT BREAST LUMPECTOMY WITH RADIOACTIVE SEED AND SENTINEL LYMPH NODE BIOPSY, INJECT BLUE DYE LEFT BREAST;  Surgeon: HFanny Skates MD;  [facility]  Service: General;  Laterality: Left;  . CARDIAC CATHETERIZATION     normal coroary arteries per patient  . CARDIOVASCULAR STRESS TEST     10/12/2013  . CESAREAN SECTION     X 3  . CHOLECYSTECTOMY    . COLONOSCOPY WITH PROPOFOL N/A 03/28/2015   Procedure: COLONOSCOPY WITH PROPOFOL;  Surgeon: JJuanita Craver MD;  Location: [facility];  Service: Endoscopy;  Laterality: N/A;  . HERNIA REPAIR  02/08/11   ventral hernia  . JOINT REPLACEMENT     bilateral  . KNEE ARTHROSCOPY  05/2010   bilateral  . LEFT AND RIGHT HEART CATHETERIZATION WITH CORONARY ANGIOGRAM N/A 11/08/2013   Procedure: LEFT AND RIGHT HEART CATHETERIZATION WITH CORONARY ANGIOGRAM;  Surgeon: CBurnell Blanks MD;  [facility];  Service: Cardiovascular;  Laterality: N/A;  . MENISCUS REPAIR  2009  . MOUTH SURGERY     teeth implants  . PORTACATH PLACEMENT Right 07/16/2016    Procedure: INSERTION PORT-A-CATH RIGHT INTERNAL JUGULAR WITH ULTRASOUND;  Surgeon: HFanny Skates MD;  [facility]  Service: General;  Laterality: Right;  . TOTAL KNEE ARTHROPLASTY  2011   left  . TOTAL KNEE ARTHROPLASTY Right 05/10/2014   Procedure: RIGHT TOTAL KNEE ARTHROPLASTY;  Surgeon: MMauri Pole MD;  Location: [facility];  Service: Orthopedics;  Laterality: Right;  . TUBAL LIGATION    . WISDOM TOOTH EXTRACTION  2000    SOCIAL HISTORY: Social History   Social History  . Marital status: Divorced    Spouse name: N/A  . Number of children: 3  . Years of education: N/A   Occupational History  . retired - [employer]    Social History Main Topics  . Smoking status: Never Smoker  . Smokeless  tobacco: Never Used  . Alcohol use Yes     Comment: rarely  . Drug use: No  . Sexual activity: No     Comment: lives with mother currently-stress   Other Topics Concern  . Not on file   Social History Narrative  . No narrative on file    FAMILY HISTORY: Family History  Problem Relation Age of Onset  . Thyroid disease Mother   . Hyperlipidemia Mother   . Heart attack Father 15  . Hypertension Father   . Arthritis Father        RA  . Coronary artery disease Father   . Coronary artery disease Brother   . Heart disease Brother   . Cancer Maternal Aunt        colon  . Cancer Maternal Grandmother        colon  . Heart attack Maternal Grandfather   . Alcohol abuse Paternal Grandfather     Review of Systems  Constitutional: Positive for fatigue. Negative for appetite change, chills and diaphoresis.  HENT:   Negative for hearing loss, lump/mass, sore throat, tinnitus and trouble swallowing.   Respiratory: Positive for chest tightness, cough, shortness of breath and wheezing.   Cardiovascular: Negative for chest pain, leg swelling and palpitations.  Neurological: Negative for dizziness and headaches.      PHYSICAL EXAMINATION  ECOG PERFORMANCE STATUS: 1 -  Symptomatic but completely ambulatory  Vitals:   11/12/16 1353  BP: (!) 141/78  Pulse: 72  Resp: 20  Temp: 98.2 F (36.8 C)    Physical Exam  Constitutional: She is oriented to person, place, and time and well-developed, well-nourished, and in no distress.  HENT:  Head: Normocephalic and atraumatic.  Right Ear: External ear normal.  Left Ear: External ear normal.  Nose: Nose normal.  Mouth/Throat: Oropharynx is clear and moist. No oropharyngeal exudate.  + frontal and maxillary sinus tenderness   Eyes: Pupils are equal, round, and reactive to light. No scleral icterus.  Neck: Neck supple.  Cardiovascular: Normal rate, regular rhythm and normal heart sounds.  Exam reveals no gallop and no friction rub.   No murmur heard. Pulmonary/Chest: Effort normal and breath sounds normal. No respiratory distress. She has no wheezes. She has no rales. She exhibits no tenderness.  Abdominal: Soft. Bowel sounds are normal. She exhibits no distension and no mass. There is no tenderness. There is no rebound and no guarding.  Musculoskeletal: She exhibits no edema.  Lymphadenopathy:    She has no cervical adenopathy.  Neurological: She is alert and oriented to person, place, and time.  Psychiatric: Mood and affect normal.    LABORATORY DATA:  CBC    Component Value Date/Time   WBC 7.5 11/12/2016 1320   WBC 5.9 07/16/2016 0715   RBC 4.17 11/12/2016 1320   RBC 4.80 07/16/2016 0715   HGB 12.5 11/12/2016 1320   HCT 38.3 11/12/2016 1320   PLT 286 11/12/2016 1320   MCV 92.0 11/12/2016 1320   MCH 30.0 11/12/2016 1320   MCH 27.5 07/16/2016 0715   MCHC 32.6 11/12/2016 1320   MCHC 31.2 07/16/2016 0715   RDW 17.4 (H) 11/12/2016 1320   LYMPHSABS 1.7 11/12/2016 1320   MONOABS 0.5 11/12/2016 1320   EOSABS 0.1 11/12/2016 1320   BASOSABS 0.0 11/12/2016 1320    CMP     Component Value Date/Time   NA 141 11/12/2016 1320   K 4.4 11/12/2016 1320   CL 104 07/16/2016 0716   CO2 27 11/12/2016  1320   GLUCOSE 130 11/12/2016 1320   BUN 12.8 11/12/2016 1320   CREATININE 0.8 11/12/2016 1320   CALCIUM 10.1 11/12/2016 1320   PROT 7.1 11/12/2016 1320   ALBUMIN 3.7 11/12/2016 1320   AST 56 (H) 11/12/2016 1320   ALT 54 11/12/2016 1320   ALKPHOS 94 11/12/2016 1320   BILITOT 0.56 11/12/2016 1320   GFRNONAA >60 07/16/2016 0716   GFRAA >60 07/16/2016 0716     RADIOGRAPHIC STUDIES:  Dg Chest 2 View  Result Date: 11/12/2016 CLINICAL DATA:  Short of breath and cough EXAM: CHEST  2 VIEW COMPARISON:  07/16/2016 FINDINGS: Cardiac enlargement without heart failure. Mild atelectasis the bases. Negative for pneumonia or effusion. Port-A-Cath tip in the mid right atrium unchanged IMPRESSION: No active cardiopulmonary disease. Electronically Signed   By: Franchot Gallo M.D.   On: 11/12/2016 13:17    ASSESSMENT and PLAN:   Malignant neoplasm of upper-outer quadrant of left female breast [facility]) Patient is here for shortness of breath. She underwent xray that was normal.  She is on singulair, allegra, Qvar, and albuterol.  She uses flonase.  I changed her nasal spray to dymista.  She has upcoming evaluation by pulmonology.  She will try this for the next couple of days and call me on Thursday.  We may need to go ahead and do echo if she isn't better to keep her on time with her treatments.     All questions were answered. The patient knows to call the clinic with any problems, questions or concerns. We can certainly see the patient much sooner if necessary.  A total of (30) minutes of face-to-face time was spent with this patient with greater than 50% of that time in counseling and care-coordination.  Scot Dock, NP 11/12/2016

## 2016-11-12 NOTE — Assessment & Plan Note (Signed)
Patient is here for shortness of breath. She underwent xray that was normal.  She is on singulair, allegra, Qvar, and albuterol.  She uses flonase.  I changed her nasal spray to dymista.  She has upcoming evaluation by pulmonology.  She will try this for the next couple of days and call me on Thursday.  We may need to go ahead and do echo if she isn't better to keep her on time with her treatments.

## 2016-11-13 ENCOUNTER — Ambulatory Visit
Admission: RE | Admit: 2016-11-13 | Discharge: 2016-11-13 | Disposition: A | Discharge status: Home / Self Care | Payer: Medicare HMO | Source: Ambulatory Visit | Attending: Radiation Oncology | Admitting: Radiation Oncology

## 2016-11-13 ENCOUNTER — Other Ambulatory Visit: Payer: Self-pay | Admitting: Adult Health

## 2016-11-13 DIAGNOSIS — C50412 Malignant neoplasm of upper-outer quadrant of left female breast: Secondary | ICD-10-CM | POA: Diagnosis not present

## 2016-11-13 DIAGNOSIS — Z170 Estrogen receptor positive status [ER+]: Secondary | ICD-10-CM | POA: Diagnosis not present

## 2016-11-13 DIAGNOSIS — Z79899 Other long term (current) drug therapy: Secondary | ICD-10-CM | POA: Diagnosis not present

## 2016-11-13 DIAGNOSIS — Z7951 Long term (current) use of inhaled steroids: Secondary | ICD-10-CM | POA: Diagnosis not present

## 2016-11-13 DIAGNOSIS — Z7982 Long term (current) use of aspirin: Secondary | ICD-10-CM | POA: Diagnosis not present

## 2016-11-13 MED ORDER — AZELASTINE HCL 137 MCG/SPRAY NA SOLN: 1.0000 | Freq: Two times a day (BID) | NASAL | 1 refills | Status: DC

## 2016-11-13 MED FILL — AZELASTINE 0.1% (137 MCG) S: 0.1 | 30 days supply | Qty: 30 | Fill #0

## 2016-11-14 ENCOUNTER — Ambulatory Visit
Admission: RE | Admit: 2016-11-14 | Discharge: 2016-11-14 | Disposition: A | Discharge status: Home / Self Care | Payer: Medicare HMO | Source: Ambulatory Visit | Attending: Radiation Oncology | Admitting: Radiation Oncology

## 2016-11-14 DIAGNOSIS — Z79899 Other long term (current) drug therapy: Secondary | ICD-10-CM | POA: Diagnosis not present

## 2016-11-14 DIAGNOSIS — Z170 Estrogen receptor positive status [ER+]: Secondary | ICD-10-CM | POA: Diagnosis not present

## 2016-11-14 DIAGNOSIS — Z7951 Long term (current) use of inhaled steroids: Secondary | ICD-10-CM | POA: Diagnosis not present

## 2016-11-14 DIAGNOSIS — Z7982 Long term (current) use of aspirin: Secondary | ICD-10-CM | POA: Diagnosis not present

## 2016-11-14 DIAGNOSIS — C50412 Malignant neoplasm of upper-outer quadrant of left female breast: Secondary | ICD-10-CM | POA: Diagnosis not present

## 2016-11-15 ENCOUNTER — Ambulatory Visit
Admission: RE | Admit: 2016-11-15 | Discharge: 2016-11-15 | Disposition: A | Discharge status: Home / Self Care | Payer: Medicare HMO | Source: Ambulatory Visit | Attending: Radiation Oncology | Admitting: Radiation Oncology

## 2016-11-15 DIAGNOSIS — Z7982 Long term (current) use of aspirin: Secondary | ICD-10-CM | POA: Diagnosis not present

## 2016-11-15 DIAGNOSIS — Z7951 Long term (current) use of inhaled steroids: Secondary | ICD-10-CM | POA: Diagnosis not present

## 2016-11-15 DIAGNOSIS — Z170 Estrogen receptor positive status [ER+]: Secondary | ICD-10-CM | POA: Diagnosis not present

## 2016-11-15 DIAGNOSIS — Z79899 Other long term (current) drug therapy: Secondary | ICD-10-CM | POA: Diagnosis not present

## 2016-11-15 DIAGNOSIS — C50412 Malignant neoplasm of upper-outer quadrant of left female breast: Secondary | ICD-10-CM | POA: Diagnosis not present

## 2016-11-19 ENCOUNTER — Ambulatory Visit
Admission: RE | Admit: 2016-11-19 | Discharge: 2016-11-19 | Disposition: A | Discharge status: Home / Self Care | Payer: Medicare HMO | Source: Ambulatory Visit | Attending: Radiation Oncology | Admitting: Radiation Oncology

## 2016-11-19 VITALS — BP 138/87 | HR 74 | Temp 98.6°F | Ht 65.0 in | Wt 338.6 lb

## 2016-11-19 DIAGNOSIS — Z79899 Other long term (current) drug therapy: Secondary | ICD-10-CM | POA: Diagnosis not present

## 2016-11-19 DIAGNOSIS — Z170 Estrogen receptor positive status [ER+]: Principal | ICD-10-CM

## 2016-11-19 DIAGNOSIS — Z7982 Long term (current) use of aspirin: Secondary | ICD-10-CM | POA: Diagnosis not present

## 2016-11-19 DIAGNOSIS — C50412 Malignant neoplasm of upper-outer quadrant of left female breast: Secondary | ICD-10-CM | POA: Diagnosis not present

## 2016-11-19 DIAGNOSIS — Z7951 Long term (current) use of inhaled steroids: Secondary | ICD-10-CM | POA: Diagnosis not present

## 2016-11-19 NOTE — Progress Notes (Signed)
  Radiation Oncology         [phone] ________________________________  Name: [name] MRN: [id number]  Date: 11/19/2016  DOB: 11/30/1950  Weekly Radiation Therapy Management    ICD-9-CM ICD-10-CM   1. Malignant neoplasm of upper-outer quadrant of left breast in female, estrogen receptor positive ([place]) 174.4 C50.412    V86.0 Z17.0      Current Dose: 23.4 Gy     Planned Dose:  61.0 Gy  Narrative . . . . . . . . The patient presents for routine under treatment assessment.                                   Sheriece has completed 13 fractions to her left breast.  She denies having pain other than occasional sharp pains in her left breast.  She reports having fatigue.  She is using radiaplex as directed.                                  Set-up films were reviewed.                                 The chart was checked. Physical Findings. . .  height is 5\' 5"  (1.651 m) and weight is 338 lb 9.6 oz (153.6 kg) (abnormal). Her oral temperature is 98.6 F (37 C). Her blood pressure is 138/87 and her pulse is 74. Her oxygen saturation is 97%. . Weight essentially stable.  The lungs are clear. The heart has a regular rhythm and rate. Examination of left breast reveals some erythema but no significant radiation reaction at this point. Impression . . . . . . . The patient is tolerating radiation. Plan . . . . . . . . . . . . Continue treatment as planned.  ________________________________   Blair Promise, PhD, MD

## 2016-11-19 NOTE — Progress Notes (Signed)
[name] has completed 13 fractions to her left breast.  She denies having pain other than occasional sharp pains in her left breast.  She reports having fatigue.  She is using radiaplex as directed.  The skin on her left breast and subclavian area is pink.  BP 138/87 (BP Location: Right Wrist, Patient Position: Sitting)   Pulse 74   Temp 98.6 F (37 C) (Oral)   Ht 5\' 5"  (1.651 m)   Wt (!) 338 lb 9.6 oz (153.6 kg)   SpO2 97%   BMI 56.35 kg/m    Wt Readings from Last 3 Encounters:  11/19/16 (!) 338 lb 9.6 oz (153.6 kg)  11/12/16 (!) 337 lb (152.9 kg)  11/12/16 (!) 337 lb (152.9 kg)

## 2016-11-20 ENCOUNTER — Ambulatory Visit
Admission: RE | Admit: 2016-11-20 | Discharge: 2016-11-20 | Disposition: A | Discharge status: Home / Self Care | Payer: Medicare HMO | Source: Ambulatory Visit | Attending: Radiation Oncology | Admitting: Radiation Oncology

## 2016-11-20 DIAGNOSIS — Z7982 Long term (current) use of aspirin: Secondary | ICD-10-CM | POA: Diagnosis not present

## 2016-11-20 DIAGNOSIS — Z79899 Other long term (current) drug therapy: Secondary | ICD-10-CM | POA: Diagnosis not present

## 2016-11-20 DIAGNOSIS — Z7951 Long term (current) use of inhaled steroids: Secondary | ICD-10-CM | POA: Diagnosis not present

## 2016-11-20 DIAGNOSIS — C50412 Malignant neoplasm of upper-outer quadrant of left female breast: Secondary | ICD-10-CM | POA: Diagnosis not present

## 2016-11-20 DIAGNOSIS — Z170 Estrogen receptor positive status [ER+]: Secondary | ICD-10-CM | POA: Diagnosis not present

## 2016-11-21 ENCOUNTER — Ambulatory Visit
Admission: RE | Admit: 2016-11-21 | Discharge: 2016-11-21 | Disposition: A | Discharge status: Home / Self Care | Payer: Medicare HMO | Source: Ambulatory Visit | Attending: Radiation Oncology | Admitting: Radiation Oncology

## 2016-11-21 DIAGNOSIS — Z79899 Other long term (current) drug therapy: Secondary | ICD-10-CM | POA: Diagnosis not present

## 2016-11-21 DIAGNOSIS — Z7951 Long term (current) use of inhaled steroids: Secondary | ICD-10-CM | POA: Diagnosis not present

## 2016-11-21 DIAGNOSIS — Z7982 Long term (current) use of aspirin: Secondary | ICD-10-CM | POA: Diagnosis not present

## 2016-11-21 DIAGNOSIS — C50412 Malignant neoplasm of upper-outer quadrant of left female breast: Secondary | ICD-10-CM | POA: Diagnosis not present

## 2016-11-21 DIAGNOSIS — Z170 Estrogen receptor positive status [ER+]: Secondary | ICD-10-CM | POA: Diagnosis not present

## 2016-11-22 ENCOUNTER — Ambulatory Visit
Admission: RE | Admit: 2016-11-22 | Discharge: 2016-11-22 | Disposition: A | Discharge status: Home / Self Care | Payer: Medicare HMO | Source: Ambulatory Visit | Attending: Radiation Oncology | Admitting: Radiation Oncology

## 2016-11-22 DIAGNOSIS — Z7951 Long term (current) use of inhaled steroids: Secondary | ICD-10-CM | POA: Diagnosis not present

## 2016-11-22 DIAGNOSIS — Z7982 Long term (current) use of aspirin: Secondary | ICD-10-CM | POA: Diagnosis not present

## 2016-11-22 DIAGNOSIS — Z170 Estrogen receptor positive status [ER+]: Secondary | ICD-10-CM | POA: Diagnosis not present

## 2016-11-22 DIAGNOSIS — C50412 Malignant neoplasm of upper-outer quadrant of left female breast: Secondary | ICD-10-CM | POA: Diagnosis not present

## 2016-11-22 DIAGNOSIS — Z79899 Other long term (current) drug therapy: Secondary | ICD-10-CM | POA: Diagnosis not present

## 2016-11-25 ENCOUNTER — Ambulatory Visit
Admission: RE | Admit: 2016-11-25 | Discharge: 2016-11-25 | Disposition: A | Discharge status: Home / Self Care | Payer: Medicare HMO | Source: Ambulatory Visit | Attending: Radiation Oncology | Admitting: Radiation Oncology

## 2016-11-25 DIAGNOSIS — Z170 Estrogen receptor positive status [ER+]: Secondary | ICD-10-CM | POA: Diagnosis not present

## 2016-11-25 DIAGNOSIS — C50412 Malignant neoplasm of upper-outer quadrant of left female breast: Secondary | ICD-10-CM | POA: Diagnosis not present

## 2016-11-25 DIAGNOSIS — Z7951 Long term (current) use of inhaled steroids: Secondary | ICD-10-CM | POA: Diagnosis not present

## 2016-11-25 DIAGNOSIS — Z7982 Long term (current) use of aspirin: Secondary | ICD-10-CM | POA: Diagnosis not present

## 2016-11-25 DIAGNOSIS — Z79899 Other long term (current) drug therapy: Secondary | ICD-10-CM | POA: Diagnosis not present

## 2016-11-26 ENCOUNTER — Ambulatory Visit
Admission: RE | Admit: 2016-11-26 | Discharge: 2016-11-26 | Disposition: A | Discharge status: Home / Self Care | Payer: Medicare HMO | Source: Ambulatory Visit | Attending: Radiation Oncology | Admitting: Radiation Oncology

## 2016-11-26 ENCOUNTER — Encounter: Payer: Self-pay | Admitting: Radiation Oncology

## 2016-11-26 VITALS — BP 142/79 | HR 71 | Temp 98.5°F | Ht 65.0 in | Wt 336.8 lb

## 2016-11-26 DIAGNOSIS — Z79899 Other long term (current) drug therapy: Secondary | ICD-10-CM | POA: Diagnosis not present

## 2016-11-26 DIAGNOSIS — Z170 Estrogen receptor positive status [ER+]: Principal | ICD-10-CM

## 2016-11-26 DIAGNOSIS — Z7951 Long term (current) use of inhaled steroids: Secondary | ICD-10-CM | POA: Diagnosis not present

## 2016-11-26 DIAGNOSIS — C50412 Malignant neoplasm of upper-outer quadrant of left female breast: Secondary | ICD-10-CM | POA: Diagnosis not present

## 2016-11-26 DIAGNOSIS — Z7982 Long term (current) use of aspirin: Secondary | ICD-10-CM | POA: Diagnosis not present

## 2016-11-26 MED ORDER — BIAFINE EX EMUL
Freq: Once | CUTANEOUS | Status: AC
  Administered 2016-11-26: 10:00:00 via TOPICAL

## 2016-11-26 NOTE — Progress Notes (Signed)
[name] has completed 18 fractions to her left breast and subclavian area.  She reports having soreness in her right breast and fatigue.  She said the skin under her right breast feels "like it is pulling."  She put neosporin on that area.  She is using radiaplex gel and neosporin.  The skin on her left subclavian area is red and she said it is itching. She has been given biafine to try.  The skin on her left breast is red.  BP (!) 142/79 (BP Location: Right Wrist, Patient Position: Sitting)   Pulse 71   Temp 98.5 F (36.9 C) (Oral)   Ht 5\' 5"  (1.651 m)   Wt (!) 336 lb 12.8 oz (152.8 kg)   SpO2 97%   BMI 56.05 kg/m    Wt Readings from Last 3 Encounters:  11/26/16 (!) 336 lb 12.8 oz (152.8 kg)  11/19/16 (!) 338 lb 9.6 oz (153.6 kg)  11/12/16 (!) 337 lb (152.9 kg)

## 2016-11-26 NOTE — Addendum Note (Signed)
Encounter addended by: Jacqulyn Liner, RN on: 11/26/2016 10:25 AM<BR>    Actions taken: [facility] administration accepted

## 2016-11-26 NOTE — Progress Notes (Signed)
  Radiation Oncology         [phone] ________________________________  Name: [name] MRN: [id number]  Date: 11/26/2016  DOB: [birth year]  Weekly Radiation Therapy Management    ICD-9-CM ICD-10-CM   1. Malignant neoplasm of upper-outer quadrant of left breast in female, estrogen receptor positive (HCC) 174.4 C50.412 topical emolient (BIAFINE) emulsion   V86.0 Z17.0      Current Dose: 32.4 Gy     Planned Dose:  61.0 Gy  Narrative . . . . . . . . The patient presents for routine under treatment assessment.                                   [name] has completed 18 fractions to her left breast and supraclavian area.  She reports having soreness in her left breast and fatigue.  She said the skin under her left breast feels "like it is pulling."  She put neosporin on that area.  She is using radiaplex gel and neosporin.  The skin on her left supraclavian area is red and she said it is itching. She has been given biafine to try.                                 Set-up films were reviewed.                                 The chart was checked. Physical Findings. . .  height is 5\' 5"  (1.651 m) and weight is 336 lb 12.8 oz (152.8 kg) (abnormal). Her oral temperature is 98.5 F (36.9 C). Her blood pressure is 142/79 (abnormal) and her pulse is 71. Her oxygen saturation is 97%. . The lungs are clear. The heart has a regular rhythm and rate. Examination of the left breast and axillary area reveals erythema and hyperpigmentation changes. No moist desquamation. Impression . . . . . . . The patient is tolerating radiation. Plan . . . . . . . . . . . . Continue treatment as planned.  ________________________________   Blair Promise, PhD, MD

## 2016-11-27 ENCOUNTER — Ambulatory Visit
Admission: RE | Admit: 2016-11-27 | Discharge: 2016-11-27 | Disposition: A | Discharge status: Home / Self Care | Payer: Medicare HMO | Source: Ambulatory Visit | Attending: Radiation Oncology | Admitting: Radiation Oncology

## 2016-11-27 DIAGNOSIS — Z7982 Long term (current) use of aspirin: Secondary | ICD-10-CM | POA: Diagnosis not present

## 2016-11-27 DIAGNOSIS — Z170 Estrogen receptor positive status [ER+]: Secondary | ICD-10-CM | POA: Diagnosis not present

## 2016-11-27 DIAGNOSIS — Z79899 Other long term (current) drug therapy: Secondary | ICD-10-CM | POA: Diagnosis not present

## 2016-11-27 DIAGNOSIS — Z7951 Long term (current) use of inhaled steroids: Secondary | ICD-10-CM | POA: Diagnosis not present

## 2016-11-27 DIAGNOSIS — C50412 Malignant neoplasm of upper-outer quadrant of left female breast: Secondary | ICD-10-CM | POA: Diagnosis not present

## 2016-11-28 ENCOUNTER — Ambulatory Visit: Payer: Medicare HMO

## 2016-11-28 ENCOUNTER — Ambulatory Visit (HOSPITAL_BASED_OUTPATIENT_CLINIC_OR_DEPARTMENT_OTHER): Payer: Medicare HMO

## 2016-11-28 ENCOUNTER — Encounter: Payer: Self-pay | Admitting: Hematology and Oncology

## 2016-11-28 ENCOUNTER — Other Ambulatory Visit (HOSPITAL_BASED_OUTPATIENT_CLINIC_OR_DEPARTMENT_OTHER): Payer: Medicare HMO

## 2016-11-28 ENCOUNTER — Ambulatory Visit
Admission: RE | Admit: 2016-11-28 | Discharge: 2016-11-28 | Disposition: A | Discharge status: Home / Self Care | Payer: Medicare HMO | Source: Ambulatory Visit | Attending: Radiation Oncology | Admitting: Radiation Oncology

## 2016-11-28 ENCOUNTER — Ambulatory Visit (HOSPITAL_BASED_OUTPATIENT_CLINIC_OR_DEPARTMENT_OTHER): Payer: Medicare HMO | Admitting: Hematology and Oncology

## 2016-11-28 DIAGNOSIS — Z5112 Encounter for antineoplastic immunotherapy: Secondary | ICD-10-CM

## 2016-11-28 DIAGNOSIS — Z79899 Other long term (current) drug therapy: Secondary | ICD-10-CM | POA: Diagnosis not present

## 2016-11-28 DIAGNOSIS — Z170 Estrogen receptor positive status [ER+]: Secondary | ICD-10-CM

## 2016-11-28 DIAGNOSIS — Z7951 Long term (current) use of inhaled steroids: Secondary | ICD-10-CM | POA: Diagnosis not present

## 2016-11-28 DIAGNOSIS — R5383 Other fatigue: Secondary | ICD-10-CM

## 2016-11-28 DIAGNOSIS — I509 Heart failure, unspecified: Secondary | ICD-10-CM

## 2016-11-28 DIAGNOSIS — C773 Secondary and unspecified malignant neoplasm of axilla and upper limb lymph nodes: Secondary | ICD-10-CM

## 2016-11-28 DIAGNOSIS — C50412 Malignant neoplasm of upper-outer quadrant of left female breast: Secondary | ICD-10-CM

## 2016-11-28 DIAGNOSIS — R197 Diarrhea, unspecified: Secondary | ICD-10-CM | POA: Diagnosis not present

## 2016-11-28 DIAGNOSIS — Z95828 Presence of other vascular implants and grafts: Secondary | ICD-10-CM

## 2016-11-28 DIAGNOSIS — Z7982 Long term (current) use of aspirin: Secondary | ICD-10-CM | POA: Diagnosis not present

## 2016-11-28 LAB — CBC WITH DIFFERENTIAL/PLATELET
BASO%: 0.4 % (ref 0.0–2.0)
Basophils Absolute: 0 10*3/uL (ref 0.0–0.1)
EOS ABS: 0 10*3/uL (ref 0.0–0.5)
EOS%: 0.5 % (ref 0.0–7.0)
HCT: 38.3 % (ref 34.8–46.6)
HGB: 12.6 g/dL (ref 11.6–15.9)
LYMPH%: 14.5 % (ref 14.0–49.7)
MCH: 29.6 pg (ref 25.1–34.0)
MCHC: 32.8 g/dL (ref 31.5–36.0)
MCV: 90.1 fL (ref 79.5–101.0)
MONO#: 0.5 10*3/uL (ref 0.1–0.9)
MONO%: 6.7 % (ref 0.0–14.0)
NEUT%: 77.9 % — ABNORMAL HIGH (ref 38.4–76.8)
NEUTROS ABS: 5.6 10*3/uL (ref 1.5–6.5)
Platelets: 273 10*3/uL (ref 145–400)
RBC: 4.25 10*6/uL (ref 3.70–5.45)
RDW: 16.3 % — ABNORMAL HIGH (ref 11.2–14.5)
WBC: 7.2 10*3/uL (ref 3.9–10.3)
lymph#: 1 10*3/uL (ref 0.9–3.3)

## 2016-11-28 LAB — COMPREHENSIVE METABOLIC PANEL
ALT: 53 U/L (ref 0–55)
AST: 69 U/L — ABNORMAL HIGH (ref 5–34)
Albumin: 3.5 g/dL (ref 3.5–5.0)
Alkaline Phosphatase: 88 U/L (ref 40–150)
Anion Gap: 12 mEq/L — ABNORMAL HIGH (ref 3–11)
BUN: 11.6 mg/dL (ref 7.0–26.0)
CHLORIDE: 104 meq/L (ref 98–109)
CO2: 26 meq/L (ref 22–29)
Calcium: 10.1 mg/dL (ref 8.4–10.4)
Creatinine: 0.8 mg/dL (ref 0.6–1.1)
EGFR: 80 mL/min/{1.73_m2} — AB (ref >90)
GLUCOSE: 142 mg/dL — AB (ref 70–140)
POTASSIUM: 4 meq/L (ref 3.5–5.1)
Sodium: 142 mEq/L (ref 136–145)
TOTAL PROTEIN: 7.2 g/dL (ref 6.4–8.3)
Total Bilirubin: 0.49 mg/dL (ref 0.20–1.20)

## 2016-11-28 MED ORDER — SODIUM CHLORIDE 0.9% FLUSH
10.0000 mL | INTRAVENOUS | Status: DC | PRN
  Administered 2016-11-28: 10 mL via INTRAVENOUS
  Filled 2016-11-28: qty 10

## 2016-11-28 MED ORDER — HEPARIN SOD (PORK) LOCK FLUSH 100 UNIT/ML IV SOLN
500.0000 [IU] | Freq: Once | INTRAVENOUS | Status: AC | PRN
  Administered 2016-11-28: 500 [IU]
  Filled 2016-11-28: qty 5

## 2016-11-28 MED ORDER — PERTUZUMAB CHEMO INJECTION 420 MG/14ML
420.0000 mg | Freq: Once | INTRAVENOUS | Status: AC
  Administered 2016-11-28: 420 mg via INTRAVENOUS
  Filled 2016-11-28: qty 14

## 2016-11-28 MED ORDER — SODIUM CHLORIDE 0.9 % IV SOLN
Freq: Once | INTRAVENOUS | Status: AC
  Administered 2016-11-28: 11:00:00 via INTRAVENOUS

## 2016-11-28 MED ORDER — ACETAMINOPHEN 325 MG PO TABS
ORAL_TABLET | ORAL | Status: AC
  Filled 2016-11-28: qty 2

## 2016-11-28 MED ORDER — SODIUM CHLORIDE 0.9% FLUSH
10.0000 mL | INTRAVENOUS | Status: DC | PRN
  Administered 2016-11-28: 10 mL
  Filled 2016-11-28: qty 10

## 2016-11-28 MED ORDER — DIPHENHYDRAMINE HCL 25 MG PO CAPS
50.0000 mg | ORAL_CAPSULE | Freq: Once | ORAL | Status: AC
  Administered 2016-11-28: 50 mg via ORAL

## 2016-11-28 MED ORDER — TRASTUZUMAB CHEMO 150 MG IV SOLR
900.0000 mg | Freq: Once | INTRAVENOUS | Status: AC
  Administered 2016-11-28: 900 mg via INTRAVENOUS
  Filled 2016-11-28: qty 42.86

## 2016-11-28 MED ORDER — ACETAMINOPHEN 325 MG PO TABS
650.0000 mg | ORAL_TABLET | Freq: Once | ORAL | Status: AC
  Administered 2016-11-28: 650 mg via ORAL

## 2016-11-28 MED ORDER — DIPHENHYDRAMINE HCL 25 MG PO CAPS
ORAL_CAPSULE | ORAL | Status: AC
  Filled 2016-11-28: qty 2

## 2016-11-28 NOTE — Progress Notes (Signed)
Patient Care Team: [name], MD as PCP - General (Family Medicine) Fanny Skates, MD as Consulting Physician (General Surgery) Nicholas Lose, MD as Consulting Physician (Hematology and Oncology) Gery Pray, MD as Consulting Physician (Radiation Oncology) [name], NP as Nurse Practitioner (Hematology and Oncology)  DIAGNOSIS:  Encounter Diagnosis  Name Primary?  . Malignant neoplasm of upper-outer quadrant of left breast in female, estrogen receptor positive ([place])     SUMMARY OF ONCOLOGIC HISTORY:   Malignant neoplasm of upper-outer quadrant of left female breast ([name])   05/13/2016 Initial Diagnosis    Left breast biopsy 3:00: IDC, grade 3, ER 30%, PR 0%, Ki-67 40%, HER-2 positive ratio 2.71, screening detected left breast mass 9 x 7 x 6 mm, axilla negative, T1 BN 0 stage IA clinical stage      06/21/2016 Surgery    Left lumpectomy: IDC grade 3, 1.1 cm, 1/5 LN Positive with ECE, Margins Neg, LVI Present; Er 30%, PR 0%, Ki 67 40%, Her 2 Positive Ratio 2.71; T1CN1 (Stage 2B)      07/25/2016 - 10/10/2016 Chemotherapy    Taxol weekly 12; Herceptin and Perjeta every 3 weeks       10/31/2016 - 11/28/2016 Radiation Therapy    Adjuvant radiation therapy       CHIEF COMPLIANT: Follow-up on Herceptin and Perjeta along with radiation  INTERVAL HISTORY: [name] is a 66 year old with above-mentioned history of left breast cancer currently on adjuvant therapy with Herceptin and Perjeta. She complains of intermittent diarrhea. Denies any nausea vomiting. She is also currently taking radiation and is down to 2 weeks. She complains of fatigue due to radiation therapy.  REVIEW OF SYSTEMS:   Constitutional: Denies fevers, chills or abnormal weight loss Eyes: Denies blurriness of vision Ears, nose, mouth, throat, and face: Denies mucositis or sore throat Respiratory: Denies cough, dyspnea or wheezes Cardiovascular: Denies palpitation, chest  discomfort Gastrointestinal:  Denies nausea, heartburn or change in bowel habits Skin: Denies abnormal skin rashes Lymphatics: Denies new lymphadenopathy or easy bruising Neurological:Denies numbness, tingling or new weaknesses Behavioral/Psych: Mood is stable, no new changes  Extremities: No lower extremity edema Breast:  denies any pain or lumps or nodules in either breasts All other systems were reviewed with the patient and are negative.  I have reviewed the past medical history, past surgical history, social history and family history with the patient and they are unchanged from previous note.  ALLERGIES:  is allergic to no known allergies.  MEDICATIONS:  Current Outpatient Prescriptions  Medication Sig Dispense Refill  . acetaminophen (TYLENOL) 500 MG tablet Take 1,000 mg by mouth 2 (two) times daily as needed for moderate pain or headache.    . albuterol (PROVENTIL HFA;VENTOLIN HFA) 108 (90 Base) MCG/ACT inhaler Inhale 2 puffs into the lungs every 6 (six) hours as needed for wheezing or shortness of breath. 1 Inhaler 6  . aspirin EC 81 MG tablet Take 81 mg by mouth at bedtime.     . Azelastine & Fluticasone 137 & 50 MCG/ACT THPK Place 1 spray into the nose daily. 1 each 5  . Azelastine HCl 137 MCG/SPRAY SOLN Place 1 spray into the nose 2 (two) times daily. 30 mL 1  . beclomethasone (QVAR) 80 MCG/ACT inhaler Inhale 1-2 puffs into the lungs 2 (two) times daily. (Patient taking differently: Inhale 1-2 puffs into the lungs 2 (two) times daily as needed (shortness of breath). ) 1 Inhaler 5  . clotrimazole-betamethasone (LOTRISONE) cream Apply 1 application topically  2 (two) times daily as needed (for rash). 30 g 0  . diclofenac (VOLTAREN) 75 MG EC tablet TAKE 1 TABLET TWICE DAILY 180 tablet 1  . diltiazem (CARDIZEM CD) 120 MG 24 hr capsule Take 1 capsule (120 mg total) by mouth daily. 90 capsule 1  . diphenoxylate-atropine (LOMOTIL) 2.5-0.025 MG tablet Take 1 tablet by mouth 4 (four)  times daily as needed for diarrhea or loose stools. 60 tablet 0  . emollient (BIAFINE) cream Apply topically 2 (two) times daily.    Marland Kitchen escitalopram (LEXAPRO) 20 MG tablet Take 1 tablet (20 mg total) by mouth daily. 90 tablet 1  . esomeprazole (NEXIUM) 40 MG capsule Take 40 mg by mouth daily.     . fluticasone (FLONASE) 50 MCG/ACT nasal spray Place 1 spray into both nostrils daily as needed for allergies. (Patient taking differently: Place 1 spray into both nostrils daily as needed (FOR SINUS ISSUE). ) 48 g 1  . furosemide (LASIX) 20 MG tablet Take 1 tablet (20 mg total) by mouth daily. 90 tablet 1  . hyaluronate sodium (RADIAPLEXRX) GEL Apply 1 application topically 2 (two) times daily.    . Hypromellose (ARTIFICIAL TEARS OP) Apply 1 drop to eye daily as needed (dry eyes).    Marland Kitchen lidocaine-prilocaine (EMLA) cream Apply to affected area once 30 g 3  . LORazepam (ATIVAN) 0.5 MG tablet Take 1 tablet (0.5 mg total) by mouth every 6 (six) hours as needed (Nausea or vomiting). 30 tablet 0  . losartan (COZAAR) 100 MG tablet Take 1 tablet (100 mg total) by mouth daily. 90 tablet 3  . metFORMIN (GLUCOPHAGE) 500 MG tablet Take 1 tablet (500 mg total) by mouth daily. 30 tablet 3  . montelukast (SINGULAIR) 10 MG tablet Take 1 tablet (10 mg total) by mouth at bedtime. 90 tablet 1  . nebivolol (BYSTOLIC) 10 MG tablet Take 1 tablet (10 mg total) by mouth daily. 90 tablet 1  . non-metallic deodorant (ALRA) MISC Apply 1 application topically daily as needed.    . ondansetron (ZOFRAN) 8 MG tablet Take 1 tablet (8 mg total) by mouth 2 (two) times daily as needed (Nausea or vomiting). 30 tablet 1  . prochlorperazine (COMPAZINE) 10 MG tablet Take 1 tablet (10 mg total) by mouth every 6 (six) hours as needed (Nausea or vomiting). (Patient not taking: Reported on 10/23/2016) 30 tablet 3  . rosuvastatin (CRESTOR) 40 MG tablet Take 1 tablet (40 mg total) by mouth daily. (Patient taking differently: Take 40 mg by mouth at  bedtime. ) 90 tablet 3  . triamcinolone (KENALOG) 0.025 % ointment Apply 1 application topically 2 (two) times daily. (Patient not taking: Reported on 10/23/2016) 30 g 0   No current facility-administered medications for this visit.     PHYSICAL EXAMINATION: ECOG PERFORMANCE STATUS: 1 - Symptomatic but completely ambulatory  Vitals:   11/28/16 1012  BP: (!) 143/81  Pulse: 94  Resp: (!) 22  Temp: 98.5 F (36.9 C)   Filed Weights   11/28/16 1012  Weight: (!) 335 lb 3.2 oz (152 kg)    GENERAL:alert, no distress and comfortable SKIN: skin color, texture, turgor are normal, no rashes or significant lesions EYES: normal, Conjunctiva are pink and non-injected, sclera clear OROPHARYNX:no exudate, no erythema and lips, buccal mucosa, and tongue normal  NECK: supple, thyroid normal size, non-tender, without nodularity LYMPH:  no palpable lymphadenopathy in the cervical, axillary or inguinal LUNGS: clear to auscultation and percussion with normal breathing effort HEART: regular rate &  rhythm and no murmurs and no lower extremity edema ABDOMEN:abdomen soft, non-tender and normal bowel sounds MUSCULOSKELETAL:no cyanosis of digits and no clubbing  NEURO: alert & oriented x 3 with fluent speech, no focal motor/sensory deficits EXTREMITIES: No lower extremity edema  LABORATORY DATA:  I have reviewed the data as listed   Chemistry      Component Value Date/Time   NA 141 11/12/2016 1320   K 4.4 11/12/2016 1320   CL 104 07/16/2016 0716   CO2 27 11/12/2016 1320   BUN 12.8 11/12/2016 1320   CREATININE 0.8 11/12/2016 1320      Component Value Date/Time   CALCIUM 10.1 11/12/2016 1320   ALKPHOS 94 11/12/2016 1320   AST 56 (H) 11/12/2016 1320   ALT 54 11/12/2016 1320   BILITOT 0.56 11/12/2016 1320       Lab Results  Component Value Date   WBC 7.2 11/28/2016   HGB 12.6 11/28/2016   HCT 38.3 11/28/2016   MCV 90.1 11/28/2016   PLT 273 11/28/2016   NEUTROABS 5.6 11/28/2016     ASSESSMENT & PLAN:  Malignant neoplasm of upper-outer quadrant of left female breast ([place]) 06/21/16: Left lumpectomy: IDC grade 3, 1.1 cm, 1/5 LN Positive with ECE, Margins Neg, LVI Present; Er 30%, PR 0%, Ki 67 40%, Her 2 Positive Ratio 2.71; T1CN1 (Stage 2B)  Patient has multiple comorbidities including history of LVH with congestive heart failure  Treatment Plan: 1. Adjuvant Taxol with Herceptin And Perjeta (Last echocardiogram showed an ejection fraction of 55-60%-- repeat on 4/3/2018EF 60-65%) severe she can vegetables) 2. Followed by adjuvant radiation 3. Followed by adjuvant antiestrogen therapy with anastrozole 1 mg daily 5 years (bone density 04/29/2016 T score -1.2) ----------------------------------------------------------------------------------------------------------------------------------------------------- Current Treatment: Completed 12 cycles ofweeklyTaxol on 10/10/2016;Herceptin and Perjeta Q 3 weeks  Continue with Herceptin and Perjeta every 3 weeks Echocardiogram showed EF of 60-65% Currently on adjuvant radiation.  Shortness of breath: I recommended that we obtain an echocardiogram Return to clinic every 6 weeks for follow-up with me   I spent 25 minutes talking to the patient of which more than half was spent in counseling and coordination of care.  No orders of the defined types were placed in this encounter.  The patient has a good understanding of the overall plan. she agrees with it. she will call with any problems that may develop before the next visit here.   Rulon Eisenmenger, MD 11/28/16

## 2016-11-28 NOTE — Assessment & Plan Note (Addendum)
06/21/16: Left lumpectomy: IDC grade 3, 1.1 cm, 1/5 LN Positive with ECE, Margins Neg, LVI Present; Er 30%, PR 0%, Ki 67 40%, Her 2 Positive Ratio 2.71; T1CN1 (Stage 2B)  Patient has multiple comorbidities including history of LVH with congestive heart failure  Treatment Plan: 1. Adjuvant Taxol with Herceptin And Perjeta (Last echocardiogram showed an ejection fraction of 55-60%-- repeat on 4/3/2018EF 60-65%) severe she can vegetables) 2. Followed by adjuvant radiation 3. Followed by adjuvant antiestrogen therapy with anastrozole 1 mg daily 5 years (bone density 04/29/2016 T score -1.2) ----------------------------------------------------------------------------------------------------------------------------------------------------- Current Treatment: Completed 12 cycles ofweeklyTaxol on 10/10/2016;Herceptin and Perjeta Q 3 weeks  Continue with Herceptin and Perjeta every 3 weeks Echocardiogram showed EF of 60-65% Currently on adjuvant radiation.  Shortness of breath: I recommended that we obtain an echocardiogram Return to clinic every 6 weeks for follow-up with me

## 2016-11-28 NOTE — Patient Instructions (Signed)
[facility] Discharge Instructions for Patients Receiving Chemotherapy  Today you received the following chemotherapy agents:  Perjeta, Herceptin  To help prevent nausea and vomiting after your treatment, we encourage you to take your nausea medication as prescribed.   If you develop nausea and vomiting that is not controlled by your nausea medication, call the clinic.   BELOW ARE SYMPTOMS THAT SHOULD BE REPORTED IMMEDIATELY:  *FEVER GREATER THAN 100.5 F  *CHILLS WITH OR WITHOUT FEVER  NAUSEA AND VOMITING THAT IS NOT CONTROLLED WITH YOUR NAUSEA MEDICATION  *UNUSUAL SHORTNESS OF BREATH  *UNUSUAL BRUISING OR BLEEDING  TENDERNESS IN MOUTH AND THROAT WITH OR WITHOUT PRESENCE OF ULCERS  *URINARY PROBLEMS  *BOWEL PROBLEMS  UNUSUAL RASH Items with * indicate a potential emergency and should be followed up as soon as possible.  Feel free to call the clinic you have any questions or concerns. The clinic phone number is [phone].  Please show the [facility] at check-in to the Emergency Department and triage nurse.

## 2016-11-28 NOTE — Patient Instructions (Signed)

## 2016-11-29 ENCOUNTER — Ambulatory Visit
Admission: RE | Admit: 2016-11-29 | Discharge: 2016-11-29 | Disposition: A | Discharge status: Home / Self Care | Payer: Medicare HMO | Source: Ambulatory Visit | Attending: Radiation Oncology | Admitting: Radiation Oncology

## 2016-11-29 DIAGNOSIS — Z7982 Long term (current) use of aspirin: Secondary | ICD-10-CM | POA: Diagnosis not present

## 2016-11-29 DIAGNOSIS — Z79899 Other long term (current) drug therapy: Secondary | ICD-10-CM | POA: Diagnosis not present

## 2016-11-29 DIAGNOSIS — C50412 Malignant neoplasm of upper-outer quadrant of left female breast: Secondary | ICD-10-CM | POA: Diagnosis not present

## 2016-11-29 DIAGNOSIS — Z7951 Long term (current) use of inhaled steroids: Secondary | ICD-10-CM | POA: Diagnosis not present

## 2016-11-29 DIAGNOSIS — Z170 Estrogen receptor positive status [ER+]: Secondary | ICD-10-CM | POA: Diagnosis not present

## 2016-12-02 ENCOUNTER — Ambulatory Visit
Admission: RE | Admit: 2016-12-02 | Discharge: 2016-12-02 | Disposition: A | Discharge status: Home / Self Care | Payer: Medicare HMO | Source: Ambulatory Visit | Attending: Radiation Oncology | Admitting: Radiation Oncology

## 2016-12-02 DIAGNOSIS — C50412 Malignant neoplasm of upper-outer quadrant of left female breast: Secondary | ICD-10-CM | POA: Diagnosis not present

## 2016-12-02 DIAGNOSIS — Z7951 Long term (current) use of inhaled steroids: Secondary | ICD-10-CM | POA: Diagnosis not present

## 2016-12-02 DIAGNOSIS — Z7982 Long term (current) use of aspirin: Secondary | ICD-10-CM | POA: Diagnosis not present

## 2016-12-02 DIAGNOSIS — Z170 Estrogen receptor positive status [ER+]: Secondary | ICD-10-CM | POA: Diagnosis not present

## 2016-12-02 DIAGNOSIS — Z79899 Other long term (current) drug therapy: Secondary | ICD-10-CM | POA: Diagnosis not present

## 2016-12-03 ENCOUNTER — Ambulatory Visit
Admission: RE | Admit: 2016-12-03 | Discharge: 2016-12-03 | Disposition: A | Discharge status: Home / Self Care | Payer: Medicare HMO | Source: Ambulatory Visit | Attending: Radiation Oncology | Admitting: Radiation Oncology

## 2016-12-03 ENCOUNTER — Ambulatory Visit: Payer: Medicare HMO | Admitting: Radiation Oncology

## 2016-12-03 ENCOUNTER — Telehealth: Payer: Self-pay

## 2016-12-03 DIAGNOSIS — Z7951 Long term (current) use of inhaled steroids: Secondary | ICD-10-CM | POA: Diagnosis not present

## 2016-12-03 DIAGNOSIS — Z170 Estrogen receptor positive status [ER+]: Principal | ICD-10-CM

## 2016-12-03 DIAGNOSIS — R0602 Shortness of breath: Secondary | ICD-10-CM

## 2016-12-03 DIAGNOSIS — Z79899 Other long term (current) drug therapy: Secondary | ICD-10-CM | POA: Diagnosis not present

## 2016-12-03 DIAGNOSIS — C50412 Malignant neoplasm of upper-outer quadrant of left female breast: Secondary | ICD-10-CM

## 2016-12-03 DIAGNOSIS — Z7982 Long term (current) use of aspirin: Secondary | ICD-10-CM | POA: Diagnosis not present

## 2016-12-03 MED ORDER — RADIAPLEXRX EX GEL
Freq: Once | CUTANEOUS | Status: AC
  Administered 2016-12-03: 09:00:00 via TOPICAL

## 2016-12-03 NOTE — Telephone Encounter (Signed)
ECHO ordered for scheduling. Patient agrees with treatment plan. 

## 2016-12-03 NOTE — Telephone Encounter (Signed)
-----   Message from Sueanne Margarita, MD sent at 11/28/2016  6:45 PM EDT ----- Please repeat echo with strain  Traci ----- Message ----- From: Nicholas Lose, MD Sent: 11/28/2016  10:40 AM To: Sueanne Margarita, MD  Dr.Turner, Ms Sigman is on Herceptin and Perjeta. Her last ECHO was on 09/24/16. But shes had increasing SOB (could be related to airway issues) but I was wondering if you can assess her heart and make sure Herceptin is not impacting her heart function. Thanks Nicholas Lose Oncology

## 2016-12-04 ENCOUNTER — Ambulatory Visit
Admission: RE | Admit: 2016-12-04 | Discharge: 2016-12-04 | Disposition: A | Discharge status: Home / Self Care | Payer: Medicare HMO | Source: Ambulatory Visit | Attending: Radiation Oncology | Admitting: Radiation Oncology

## 2016-12-04 ENCOUNTER — Other Ambulatory Visit: Payer: Self-pay

## 2016-12-04 ENCOUNTER — Ambulatory Visit (HOSPITAL_COMMUNITY): Payer: Medicare HMO | Attending: Cardiology

## 2016-12-04 ENCOUNTER — Ambulatory Visit: Payer: Medicare HMO | Admitting: Radiation Oncology

## 2016-12-04 DIAGNOSIS — I5030 Unspecified diastolic (congestive) heart failure: Secondary | ICD-10-CM | POA: Insufficient documentation

## 2016-12-04 DIAGNOSIS — C50412 Malignant neoplasm of upper-outer quadrant of left female breast: Secondary | ICD-10-CM | POA: Diagnosis not present

## 2016-12-04 DIAGNOSIS — I420 Dilated cardiomyopathy: Secondary | ICD-10-CM | POA: Diagnosis not present

## 2016-12-04 DIAGNOSIS — Z170 Estrogen receptor positive status [ER+]: Secondary | ICD-10-CM | POA: Diagnosis not present

## 2016-12-04 DIAGNOSIS — Z79899 Other long term (current) drug therapy: Secondary | ICD-10-CM | POA: Diagnosis not present

## 2016-12-04 DIAGNOSIS — I059 Rheumatic mitral valve disease, unspecified: Secondary | ICD-10-CM | POA: Diagnosis not present

## 2016-12-04 DIAGNOSIS — Z7951 Long term (current) use of inhaled steroids: Secondary | ICD-10-CM | POA: Diagnosis not present

## 2016-12-04 DIAGNOSIS — Z7982 Long term (current) use of aspirin: Secondary | ICD-10-CM | POA: Diagnosis not present

## 2016-12-04 DIAGNOSIS — R0602 Shortness of breath: Secondary | ICD-10-CM

## 2016-12-05 ENCOUNTER — Ambulatory Visit
Admission: RE | Admit: 2016-12-05 | Discharge: 2016-12-05 | Disposition: A | Discharge status: Home / Self Care | Payer: Medicare HMO | Source: Ambulatory Visit | Attending: Radiation Oncology | Admitting: Radiation Oncology

## 2016-12-05 DIAGNOSIS — Z7951 Long term (current) use of inhaled steroids: Secondary | ICD-10-CM | POA: Diagnosis not present

## 2016-12-05 DIAGNOSIS — Z7982 Long term (current) use of aspirin: Secondary | ICD-10-CM | POA: Diagnosis not present

## 2016-12-05 DIAGNOSIS — Z170 Estrogen receptor positive status [ER+]: Secondary | ICD-10-CM | POA: Diagnosis not present

## 2016-12-05 DIAGNOSIS — C50412 Malignant neoplasm of upper-outer quadrant of left female breast: Secondary | ICD-10-CM | POA: Diagnosis not present

## 2016-12-05 DIAGNOSIS — Z79899 Other long term (current) drug therapy: Secondary | ICD-10-CM | POA: Diagnosis not present

## 2016-12-05 NOTE — Progress Notes (Signed)
  Radiation Oncology         [phone] ________________________________  Name: [name] MRN: [id number]  Date: 12/05/2016  DOB: 1951/02/10  Electron Simulation Note    ICD-10-CM   1. Malignant neoplasm of upper-outer quadrant of left breast in female, estrogen receptor positive ([place]) C50.412    Z17.0     Stage II-B (T1c, N1), grade 3, invasive ductal carcinoma of the left breast (ER+, PR-, HER2+)  Status: Outpatient  NARRATIVE: The patient was brought to the treatment unit and placed in the planned treatment position. The clinical setup was verified. The patient then had set up of her custom electron cutout field encompassing the target  area. The patient will be treated with 15MeV electrons. . A special port plan is requested for treatment.  PLAN: The patient will receive 8 treatments for a boost dose of 16 Gy and cumulative dose of 61Gy. -----------------------------------  Blair Promise, PhD, MD This document serves as a record of services personally performed by Gery Pray, MD. It was created on his behalf by Bethann Humble, a trained medical scribe. The creation of this record is based on the scribe's personal observations and the provider's statements to them. This document has been checked and approved by the attending provider.

## 2016-12-06 ENCOUNTER — Telehealth: Payer: Self-pay

## 2016-12-06 ENCOUNTER — Ambulatory Visit
Admission: RE | Admit: 2016-12-06 | Discharge: 2016-12-06 | Disposition: A | Discharge status: Home / Self Care | Payer: Medicare HMO | Source: Ambulatory Visit | Attending: Radiation Oncology | Admitting: Radiation Oncology

## 2016-12-06 DIAGNOSIS — Z7951 Long term (current) use of inhaled steroids: Secondary | ICD-10-CM | POA: Diagnosis not present

## 2016-12-06 DIAGNOSIS — R0602 Shortness of breath: Secondary | ICD-10-CM

## 2016-12-06 DIAGNOSIS — Z7982 Long term (current) use of aspirin: Secondary | ICD-10-CM | POA: Diagnosis not present

## 2016-12-06 DIAGNOSIS — C50412 Malignant neoplasm of upper-outer quadrant of left female breast: Secondary | ICD-10-CM | POA: Diagnosis not present

## 2016-12-06 DIAGNOSIS — Z170 Estrogen receptor positive status [ER+]: Secondary | ICD-10-CM | POA: Diagnosis not present

## 2016-12-06 DIAGNOSIS — R931 Abnormal findings on diagnostic imaging of heart and coronary circulation: Secondary | ICD-10-CM

## 2016-12-06 DIAGNOSIS — Z79899 Other long term (current) drug therapy: Secondary | ICD-10-CM | POA: Diagnosis not present

## 2016-12-06 NOTE — Telephone Encounter (Signed)
-----   Message from Sueanne Margarita, MD sent at 12/04/2016  3:54 PM EDT ----- Echo showed low normal LVF with EF 50-55% with moderate LVH, mild LAE.  Compared to prior echo EF appears mildly reduced and patient is getting Chemo with new SOB.  Please order cardiac MRI to assess EF - patient has portacath so please make sure it is ok with MRI

## 2016-12-06 NOTE — Telephone Encounter (Signed)
Informed patient of results and verbal understanding expressed.  Confirmed with Dr. Meda Coffee that portacath is compatible with MRI machine. MRI ordered for scheduling. Patient agrees with treatment plan.

## 2016-12-09 ENCOUNTER — Ambulatory Visit
Admission: RE | Admit: 2016-12-09 | Discharge: 2016-12-09 | Disposition: A | Discharge status: Home / Self Care | Payer: Medicare HMO | Source: Ambulatory Visit | Attending: Radiation Oncology | Admitting: Radiation Oncology

## 2016-12-09 ENCOUNTER — Encounter: Payer: Self-pay | Admitting: Family Medicine

## 2016-12-09 ENCOUNTER — Ambulatory Visit (INDEPENDENT_AMBULATORY_CARE_PROVIDER_SITE_OTHER): Payer: Medicare HMO | Admitting: Family Medicine

## 2016-12-09 DIAGNOSIS — F329 Major depressive disorder, single episode, unspecified: Secondary | ICD-10-CM

## 2016-12-09 DIAGNOSIS — I10 Essential (primary) hypertension: Secondary | ICD-10-CM

## 2016-12-09 DIAGNOSIS — F419 Anxiety disorder, unspecified: Secondary | ICD-10-CM | POA: Diagnosis not present

## 2016-12-09 DIAGNOSIS — C50412 Malignant neoplasm of upper-outer quadrant of left female breast: Secondary | ICD-10-CM

## 2016-12-09 DIAGNOSIS — E7800 Pure hypercholesterolemia, unspecified: Secondary | ICD-10-CM

## 2016-12-09 DIAGNOSIS — Z170 Estrogen receptor positive status [ER+]: Secondary | ICD-10-CM

## 2016-12-09 DIAGNOSIS — E6609 Other obesity due to excess calories: Secondary | ICD-10-CM

## 2016-12-09 DIAGNOSIS — R945 Abnormal results of liver function studies: Secondary | ICD-10-CM

## 2016-12-09 DIAGNOSIS — E1165 Type 2 diabetes mellitus with hyperglycemia: Secondary | ICD-10-CM

## 2016-12-09 DIAGNOSIS — F32A Depression, unspecified: Secondary | ICD-10-CM

## 2016-12-09 DIAGNOSIS — Z79899 Other long term (current) drug therapy: Secondary | ICD-10-CM | POA: Diagnosis not present

## 2016-12-09 DIAGNOSIS — Z7951 Long term (current) use of inhaled steroids: Secondary | ICD-10-CM | POA: Diagnosis not present

## 2016-12-09 DIAGNOSIS — R7989 Other specified abnormal findings of blood chemistry: Secondary | ICD-10-CM | POA: Diagnosis not present

## 2016-12-09 DIAGNOSIS — Z7982 Long term (current) use of aspirin: Secondary | ICD-10-CM | POA: Diagnosis not present

## 2016-12-09 MED ORDER — METFORMIN HCL 500 MG PO TABS: 500.0000 mg | ORAL_TABLET | Freq: Two times a day (BID) | ORAL | 3 refills | Status: DC

## 2016-12-09 MED ORDER — ALBUTEROL SULFATE HFA 108 (90 BASE) MCG/ACT IN AERS: 2.0000 | INHALATION_SPRAY | Freq: Four times a day (QID) | RESPIRATORY_TRACT | 6 refills | Status: DC | PRN

## 2016-12-09 MED ORDER — TRAMADOL HCL 50 MG PO TABS: 50.0000 mg | ORAL_TABLET | Freq: Every evening | ORAL | 0 refills | Status: DC | PRN

## 2016-12-09 MED ORDER — BECLOMETHASONE DIPROPIONATE 80 MCG/ACT IN AERS: 1.0000 | INHALATION_SPRAY | Freq: Two times a day (BID) | RESPIRATORY_TRACT | 1 refills | Status: DC | PRN

## 2016-12-09 NOTE — Assessment & Plan Note (Addendum)
Likely fatty liver disease minimize carbs.

## 2016-12-09 NOTE — Assessment & Plan Note (Signed)
Encouraged DASH diet, decrease po intake and increase exercise as tolerated. Needs 7-8 hours of sleep nightly. Avoid trans fats, eat small, frequent meals every 4-5 hours with lean proteins, complex carbs and healthy fats. Minimize simple carbs. Bariatric referral suggested

## 2016-12-09 NOTE — Assessment & Plan Note (Signed)
Is managing the stress of the cancer treatment but misses her daughter who lives in [place] and she also has to help to take care of her elderly mother. No changes of meds.

## 2016-12-09 NOTE — Assessment & Plan Note (Addendum)
hgba1c unacceptable, minimize simple carbs. Increase exercise as tolerated. Is tolerating Metformin but am sugars in the 170s frequently. After a meal can be as low as 104. Increase metformin to bid

## 2016-12-09 NOTE — Assessment & Plan Note (Signed)
Encouraged heart healthy diet, increase exercise, avoid trans fats, consider a krill oil cap daily 

## 2016-12-09 NOTE — Assessment & Plan Note (Signed)
Well controlled, no changes to meds. Encouraged heart healthy diet such as the DASH diet and exercise as tolerated.  °

## 2016-12-09 NOTE — Patient Instructions (Signed)

## 2016-12-09 NOTE — Assessment & Plan Note (Signed)
Managing her Mon to Fri radiation treatments she will have completed her treatments. Is noting some nausea each morning bu it resolves and it is not severe enough to take meds. Encouraged ginger. Is about to start antibody treatments every 3rd week til January and will start weekly chemotherapy tabs every day for 5 years. Is having skin disruption and pain at sites of radiation, using Tylenol and position changes to manage the pain minimally at night. Will give Tramadol 50 mg po qhs prn severe pain when she cannot sleep

## 2016-12-09 NOTE — Progress Notes (Signed)
 Subjective:  I acted as a scribe for Dr. Blyth. , RMA  [name], female    DOB: 08/02/1950, 66 y.o.   MRN: [id number]  No chief complaint on file.   HPI  Patient is in today for a 2 month follow up. Patient has no acute concerns. She states she has been doing well on her Metformin. No recent febrile illness or acute hospitalizations. Denies CP/palp/SOB/HA/congestion/fevers or GU c/o. Taking meds as prescribed. She is tearful about fatigue of treatments and the stress of caring for her mother and not being able to see her daughter. Is very sore in her breast and axillae fromt he radiation treatements but will be done next week. Notes nasuea each morning but resolves soon.   Patient Care Team: Blyth, Stacey A, MD as PCP - General (Family Medicine) Ingram, Haywood, MD as Consulting Physician (General Surgery) Gudena, Vinay, MD as Consulting Physician (Hematology and Oncology) Kinard, James, MD as Consulting Physician (Radiation Oncology) Causey, Lindsey Cornetto, NP as Nurse Practitioner (Hematology and Oncology)   Past Medical History:  Diagnosis Date  . Anemia 04/05/2012  . Anxiety   . Anxiety state 09/11/2007   Qualifier: Diagnosis of  By: Todd, RN, Ellen    . Atrial tachycardia, paroxysmal (HCC)   . Back pain 10/02/2014  . Chicken pox as a child  . Chronic diastolic CHF (congestive heart failure), NYHA class 1 (HCC)   . Complication of anesthesia    pt states feels different in her body after anesthesia when waking up and also experiences a smell of burnt plastic for approx a wk   . Dilated aortic root (HCC)    41mm by echo 09/2016  . Elevated LFTs 04/05/2012  . GERD (gastroesophageal reflux disease)   . Goiter   . Heart murmur    hx of one at birth   . History of kidney stones   . Hx of colonic polyps   . Hyperlipidemia   . Hypertension   . Insomnia 10/08/2016  . Malignant neoplasm of upper-outer quadrant of left female breast (HCC) 05/14/2016     not with patient  . Measles as a child  . Mumps as a child  . OA (osteoarthritis) of knee 04/05/2012  . Obesity 07/11/2014  . PONV (postoperative nausea and vomiting)   . Preventative health care 09/05/2013  . PVC's (premature ventricular contractions) 05/02/2016  . Shortness of breath dyspnea    walking distances / climbing stairs  . Sleep apnea 04/05/2012  . Tinea corporis 02/23/2013  . Vasomotor rhinitis 04/05/2012  . Ventral hernia     Past Surgical History:  Procedure Laterality Date  . BREAST LUMPECTOMY WITH RADIOACTIVE SEED AND SENTINEL LYMPH NODE BIOPSY Left 06/21/2016   Procedure: LEFT BREAST LUMPECTOMY WITH RADIOACTIVE SEED AND SENTINEL LYMPH NODE BIOPSY, INJECT BLUE DYE LEFT BREAST;  Surgeon: Haywood Ingram, MD;  Location: [facility] OR;  Service: General;  Laterality: Left;  . CARDIAC CATHETERIZATION     normal coroary arteries per patient  . CARDIOVASCULAR STRESS TEST     10/12/2013  . CESAREAN SECTION     X 3  . CHOLECYSTECTOMY    . COLONOSCOPY WITH PROPOFOL N/A 03/28/2015   Procedure: COLONOSCOPY WITH PROPOFOL;  Surgeon: Jyothi Mann, MD;  Location: [facility];  Service: Endoscopy;  Laterality: N/A;  . HERNIA REPAIR  02/08/11   ventral hernia  . JOINT REPLACEMENT     bilateral  . KNEE ARTHROSCOPY  05/2010   bilateral  . LEFT AND   RIGHT HEART CATHETERIZATION WITH CORONARY ANGIOGRAM N/A 11/08/2013   Procedure: LEFT AND RIGHT HEART CATHETERIZATION WITH CORONARY ANGIOGRAM;  Surgeon: [name], MD;  Location: [facility] CATH LAB;  Service: Cardiovascular;  Laterality: N/A;  . MENISCUS REPAIR  2009  . MOUTH SURGERY     teeth implants  . PORTACATH PLACEMENT Right 07/16/2016   Procedure: INSERTION PORT-A-CATH RIGHT INTERNAL JUGULAR WITH ULTRASOUND;  Surgeon: Fanny Skates, MD;  Location: [place] [facility];  Service: General;  Laterality: Right;  . TOTAL KNEE ARTHROPLASTY  2011   left  . TOTAL KNEE ARTHROPLASTY Right 05/10/2014   Procedure: RIGHT TOTAL KNEE ARTHROPLASTY;  Surgeon:  Mauri Pole, MD;  Location: [facility] ORS;  Service: Orthopedics;  Laterality: Right;  . TUBAL LIGATION    . WISDOM TOOTH EXTRACTION  2000    Family History  Problem Relation Age of Onset  . Thyroid disease Mother   . Hyperlipidemia Mother   . Heart attack Father 33  . Hypertension Father   . Arthritis Father        RA  . Coronary artery disease Father   . Coronary artery disease Brother   . Heart disease Brother   . Cancer Maternal Aunt        colon  . Cancer Maternal Grandmother        colon  . Heart attack Maternal Grandfather   . Alcohol abuse Paternal Grandfather     Social History   Social History  . Marital status: Divorced    Spouse name: N/A  . Number of children: 3  . Years of education: N/A   Occupational History  . retired - [employer]    Social History Main Topics  . Smoking status: Never Smoker  . Smokeless tobacco: Never Used  . Alcohol use Yes     Comment: rarely  . Drug use: No  . Sexual activity: No     Comment: lives with mother currently-stress   Other Topics Concern  . Not on file   Social History Narrative  . No narrative on file    Outpatient Medications Prior to Visit  Medication Sig Dispense Refill  . acetaminophen (TYLENOL) 500 MG tablet Take 1,000 mg by mouth 2 (two) times daily as needed for moderate pain or headache.    Marland Kitchen aspirin EC 81 MG tablet Take 81 mg by mouth at bedtime.     . Azelastine & Fluticasone 137 & 50 MCG/ACT THPK Place 1 spray into the nose daily. 1 each 5  . Azelastine HCl 137 MCG/SPRAY SOLN Place 1 spray into the nose 2 (two) times daily. 30 mL 1  . clotrimazole-betamethasone (LOTRISONE) cream Apply 1 application topically 2 (two) times daily as needed (for rash). 30 g 0  . diclofenac (VOLTAREN) 75 MG EC tablet TAKE 1 TABLET TWICE DAILY 180 tablet 1  . diltiazem (CARDIZEM CD) 120 MG 24 hr capsule Take 1 capsule (120 mg total) by mouth daily. 90 capsule 1  . diphenoxylate-atropine (LOMOTIL) 2.5-0.025 MG tablet Take  1 tablet by mouth 4 (four) times daily as needed for diarrhea or loose stools. 60 tablet 0  . emollient (BIAFINE) cream Apply topically 2 (two) times daily.    Marland Kitchen escitalopram (LEXAPRO) 20 MG tablet Take 1 tablet (20 mg total) by mouth daily. 90 tablet 1  . esomeprazole (NEXIUM) 40 MG capsule Take 40 mg by mouth daily.     . fluticasone (FLONASE) 50 MCG/ACT nasal spray Place 1 spray into both nostrils daily as needed for  allergies. (Patient taking differently: Place 1 spray into both nostrils daily as needed (FOR SINUS ISSUE). ) 48 g 1  . furosemide (LASIX) 20 MG tablet Take 1 tablet (20 mg total) by mouth daily. 90 tablet 1  . hyaluronate sodium (RADIAPLEXRX) GEL Apply 1 application topically 2 (two) times daily.    . Hypromellose (ARTIFICIAL TEARS OP) Apply 1 drop to eye daily as needed (dry eyes).    Marland Kitchen lidocaine-prilocaine (EMLA) cream Apply to affected area once 30 g 3  . LORazepam (ATIVAN) 0.5 MG tablet Take 1 tablet (0.5 mg total) by mouth every 6 (six) hours as needed (Nausea or vomiting). 30 tablet 0  . losartan (COZAAR) 100 MG tablet Take 1 tablet (100 mg total) by mouth daily. 90 tablet 3  . metFORMIN (GLUCOPHAGE) 500 MG tablet Take 1 tablet (500 mg total) by mouth daily. 30 tablet 3  . montelukast (SINGULAIR) 10 MG tablet Take 1 tablet (10 mg total) by mouth at bedtime. 90 tablet 1  . nebivolol (BYSTOLIC) 10 MG tablet Take 1 tablet (10 mg total) by mouth daily. 90 tablet 1  . non-metallic deodorant (ALRA) MISC Apply 1 application topically daily as needed.    . ondansetron (ZOFRAN) 8 MG tablet Take 1 tablet (8 mg total) by mouth 2 (two) times daily as needed (Nausea or vomiting). 30 tablet 1  . prochlorperazine (COMPAZINE) 10 MG tablet Take 1 tablet (10 mg total) by mouth every 6 (six) hours as needed (Nausea or vomiting). 30 tablet 3  . rosuvastatin (CRESTOR) 40 MG tablet Take 1 tablet (40 mg total) by mouth daily. (Patient taking differently: Take 40 mg by mouth at bedtime. ) 90 tablet 3   . triamcinolone (KENALOG) 0.025 % ointment Apply 1 application topically 2 (two) times daily. 30 g 0  . albuterol (PROVENTIL HFA;VENTOLIN HFA) 108 (90 Base) MCG/ACT inhaler Inhale 2 puffs into the lungs every 6 (six) hours as needed for wheezing or shortness of breath. 1 Inhaler 6  . beclomethasone (QVAR) 80 MCG/ACT inhaler Inhale 1-2 puffs into the lungs 2 (two) times daily. (Patient taking differently: Inhale 1-2 puffs into the lungs 2 (two) times daily as needed (shortness of breath). ) 1 Inhaler 5   No facility-administered medications prior to visit.     Allergies  Allergen Reactions  . No Known Allergies     Review of Systems  Constitutional: Negative for fever and malaise/fatigue.  HENT: Negative for congestion.   Eyes: Negative for blurred vision.  Respiratory: Negative for cough and shortness of breath.   Cardiovascular: Negative for chest pain, palpitations and leg swelling.  Gastrointestinal: Negative for vomiting.  Musculoskeletal: Negative for back pain.  Skin: Negative for rash.  Neurological: Negative for loss of consciousness and headaches.       Objective:    Physical Exam  Constitutional: She is oriented to person, place, and time. She appears well-developed and well-nourished. No distress.  HENT:  Head: Normocephalic and atraumatic.  Eyes: Conjunctivae are normal.  Neck: Normal range of motion. No thyromegaly present.  Cardiovascular: Normal rate and regular rhythm.   Pulmonary/Chest: Effort normal and breath sounds normal. She has no wheezes.  Abdominal: Soft. Bowel sounds are normal. There is no tenderness.  Musculoskeletal: Normal range of motion. She exhibits no edema or deformity.  Neurological: She is alert and oriented to person, place, and time.  Skin: Skin is warm and dry. She is not diaphoretic.  Psychiatric: She has a normal mood and affect.    BP  136/82 (BP Location: Left Arm, Patient Position: Sitting, Cuff Size: Normal)   Pulse 71   Temp  98.4 F (36.9 C) (Oral)   Resp 20   Wt (!) 335 lb 9.6 oz (152.2 kg)   SpO2 98%   BMI 55.85 kg/m  Wt Readings from Last 3 Encounters:  12/09/16 (!) 335 lb 9.6 oz (152.2 kg)  11/28/16 (!) 335 lb 3.2 oz (152 kg)  11/26/16 (!) 336 lb 12.8 oz (152.8 kg)   BP Readings from Last 3 Encounters:  12/09/16 136/82  11/28/16 (!) 143/81  11/26/16 (!) 142/79     Immunization History  Administered Date(s) Administered  . Influenza Split 03/24/2012, 03/24/2013  . Influenza, High Dose Seasonal PF 04/18/2016  . Influenza-Unspecified 03/21/2014, 03/15/2015, 03/18/2015  . Pneumococcal Conjugate-13 04/18/2016  . Tdap 06/24/2008  . Zoster 04/19/2011    Health Maintenance  Topic Date Due  . INFLUENZA VACCINE  01/22/2017  . PNA vac Low Risk Adult (2 of 2 - PPSV23) 04/18/2017  . MAMMOGRAM  04/29/2018  . TETANUS/TDAP  06/24/2018  . COLONOSCOPY  03/27/2025  . DEXA SCAN  Completed  . Hepatitis C Screening  Completed    Lab Results  Component Value Date   WBC 7.2 11/28/2016   HGB 12.6 11/28/2016   HCT 38.3 11/28/2016   PLT 273 11/28/2016   GLUCOSE 142 (H) 11/28/2016   CHOL 213 (H) 10/08/2016   TRIG (H) 10/08/2016    401.0 Triglyceride is over 400; calculations on Lipids are invalid.   HDL 35.40 (L) 10/08/2016   LDLDIRECT 108.0 10/08/2016   LDLCALC 94 05/24/2016   ALT 53 11/28/2016   AST 69 (H) 11/28/2016   NA 142 11/28/2016   K 4.0 11/28/2016   CL 104 07/16/2016   CREATININE 0.8 11/28/2016   BUN 11.6 11/28/2016   CO2 26 11/28/2016   TSH 0.95 05/02/2016   INR 1.01 05/03/2014   HGBA1C 7.8 (H) 10/08/2016    Lab Results  Component Value Date   TSH 0.95 05/02/2016   Lab Results  Component Value Date   WBC 7.2 11/28/2016   HGB 12.6 11/28/2016   HCT 38.3 11/28/2016   MCV 90.1 11/28/2016   PLT 273 11/28/2016   Lab Results  Component Value Date   NA 142 11/28/2016   K 4.0 11/28/2016   CHLORIDE 104 11/28/2016   CO2 26 11/28/2016   GLUCOSE 142 (H) 11/28/2016   BUN 11.6  11/28/2016   CREATININE 0.8 11/28/2016   BILITOT 0.49 11/28/2016   ALKPHOS 88 11/28/2016   AST 69 (H) 11/28/2016   ALT 53 11/28/2016   PROT 7.2 11/28/2016   ALBUMIN 3.5 11/28/2016   CALCIUM 10.1 11/28/2016   ANIONGAP 12 (H) 11/28/2016   EGFR 80 (L) 11/28/2016   GFR 82.45 10/16/2015   Lab Results  Component Value Date   CHOL 213 (H) 10/08/2016   Lab Results  Component Value Date   HDL 35.40 (L) 10/08/2016   Lab Results  Component Value Date   LDLCALC 94 05/24/2016   Lab Results  Component Value Date   TRIG (H) 10/08/2016    401.0 Triglyceride is over 400; calculations on Lipids are invalid.   Lab Results  Component Value Date   CHOLHDL 6 10/08/2016   Lab Results  Component Value Date   HGBA1C 7.8 (H) 10/08/2016         Assessment & Plan:   Problem List Items Addressed This Visit    None      I have   changed Ms. Harts's beclomethasone. I am also having her maintain her clotrimazole-betamethasone, fluticasone, esomeprazole, aspirin EC, rosuvastatin, Hypromellose (ARTIFICIAL TEARS OP), acetaminophen, ondansetron, lidocaine-prilocaine, triamcinolone, diphenoxylate-atropine, prochlorperazine, LORazepam, diltiazem, escitalopram, furosemide, losartan, montelukast, nebivolol, diclofenac, metFORMIN, hyaluronate sodium, non-metallic deodorant, Azelastine & Fluticasone, Azelastine HCl, emollient, and albuterol.  Meds ordered this encounter  Medications  . beclomethasone (QVAR) 80 MCG/ACT inhaler    Sig: Inhale 1-2 puffs into the lungs 2 (two) times daily as needed (shortness of breath).    Dispense:  8.7 g    Refill:  1  . albuterol (PROVENTIL HFA;VENTOLIN HFA) 108 (90 Base) MCG/ACT inhaler    Sig: Inhale 2 puffs into the lungs every 6 (six) hours as needed for wheezing or shortness of breath.    Dispense:  1 Inhaler    Refill:  6    CMA served as scribe during this visit. History, Physical and Plan performed by medical provider. Documentation and orders reviewed  and attested to.   , RMA 

## 2016-12-10 ENCOUNTER — Ambulatory Visit
Admission: RE | Admit: 2016-12-10 | Discharge: 2016-12-10 | Disposition: A | Discharge status: Home / Self Care | Payer: Medicare HMO | Source: Ambulatory Visit | Attending: Radiation Oncology | Admitting: Radiation Oncology

## 2016-12-10 ENCOUNTER — Telehealth: Payer: Self-pay

## 2016-12-10 DIAGNOSIS — Z7982 Long term (current) use of aspirin: Secondary | ICD-10-CM | POA: Diagnosis not present

## 2016-12-10 DIAGNOSIS — C50412 Malignant neoplasm of upper-outer quadrant of left female breast: Secondary | ICD-10-CM | POA: Diagnosis not present

## 2016-12-10 DIAGNOSIS — Z7951 Long term (current) use of inhaled steroids: Secondary | ICD-10-CM | POA: Diagnosis not present

## 2016-12-10 DIAGNOSIS — Z79899 Other long term (current) drug therapy: Secondary | ICD-10-CM | POA: Diagnosis not present

## 2016-12-10 DIAGNOSIS — Z170 Estrogen receptor positive status [ER+]: Secondary | ICD-10-CM | POA: Diagnosis not present

## 2016-12-10 NOTE — Telephone Encounter (Signed)
PA initiated via Covermymeds; KEY: [id number]. Awaiting determination.

## 2016-12-11 ENCOUNTER — Encounter: Payer: Self-pay | Admitting: Nurse Practitioner

## 2016-12-11 ENCOUNTER — Ambulatory Visit
Admission: RE | Admit: 2016-12-11 | Discharge: 2016-12-11 | Disposition: A | Discharge status: Home / Self Care | Payer: Medicare HMO | Source: Ambulatory Visit | Attending: Radiation Oncology | Admitting: Radiation Oncology

## 2016-12-11 DIAGNOSIS — Z170 Estrogen receptor positive status [ER+]: Secondary | ICD-10-CM | POA: Diagnosis not present

## 2016-12-11 DIAGNOSIS — C50412 Malignant neoplasm of upper-outer quadrant of left female breast: Secondary | ICD-10-CM

## 2016-12-11 DIAGNOSIS — Z79899 Other long term (current) drug therapy: Secondary | ICD-10-CM | POA: Diagnosis not present

## 2016-12-11 DIAGNOSIS — Z7951 Long term (current) use of inhaled steroids: Secondary | ICD-10-CM | POA: Diagnosis not present

## 2016-12-11 DIAGNOSIS — Z7982 Long term (current) use of aspirin: Secondary | ICD-10-CM | POA: Diagnosis not present

## 2016-12-11 MED ORDER — SILVER SULFADIAZINE 1 % EX CREA: TOPICAL_CREAM | Freq: Two times a day (BID) | CUTANEOUS | Status: DC

## 2016-12-11 NOTE — Telephone Encounter (Signed)
Patient called to check the status of authorization stated she really needs this medication because of the heat. I let her know her provider will be back tomorrow but would route this to her nurse. Call back number is [phone]

## 2016-12-11 NOTE — Telephone Encounter (Signed)
PA  For Qvar was denied under her Medicare Part D benefit due to medicare rule stating that an off label use of a drug is a use that is not included on the drugs label as approved by the FDA.

## 2016-12-11 NOTE — Telephone Encounter (Signed)
I have never managed asthma in her but check with her and see if anyone else has ever diagnosed her with asthma and copd. For now try PA for wheezing

## 2016-12-12 ENCOUNTER — Ambulatory Visit
Admission: RE | Admit: 2016-12-12 | Discharge: 2016-12-12 | Disposition: A | Discharge status: Home / Self Care | Payer: Medicare HMO | Source: Ambulatory Visit | Attending: Radiation Oncology | Admitting: Radiation Oncology

## 2016-12-12 DIAGNOSIS — Z170 Estrogen receptor positive status [ER+]: Secondary | ICD-10-CM | POA: Diagnosis not present

## 2016-12-12 DIAGNOSIS — Z7951 Long term (current) use of inhaled steroids: Secondary | ICD-10-CM | POA: Diagnosis not present

## 2016-12-12 DIAGNOSIS — C50412 Malignant neoplasm of upper-outer quadrant of left female breast: Secondary | ICD-10-CM | POA: Diagnosis not present

## 2016-12-12 DIAGNOSIS — Z79899 Other long term (current) drug therapy: Secondary | ICD-10-CM | POA: Diagnosis not present

## 2016-12-12 DIAGNOSIS — Z7982 Long term (current) use of aspirin: Secondary | ICD-10-CM | POA: Diagnosis not present

## 2016-12-12 MED ORDER — FLUTICASONE PROPIONATE HFA 110 MCG/ACT IN AERO: 1.0000 | INHALATION_SPRAY | Freq: Two times a day (BID) | RESPIRATORY_TRACT | 3 refills | Status: DC

## 2016-12-12 NOTE — Telephone Encounter (Signed)
PA has been done already and denied--see previous message.

## 2016-12-12 NOTE — Telephone Encounter (Signed)
Patient informed of PCP instructions. Sent in flovent to [facility]

## 2016-12-12 NOTE — Telephone Encounter (Signed)
I saw the message but need you to check with her and see if anyone has ever diagnosed her with asthma cuz then they might pay, if no asthma try Flovent 110, 1 puff po bid for copd and wheezing. If they turn that down she will need to askt he pharmacist what is similar her insurance will cover or I will refer to pulmonology for consult.

## 2016-12-13 ENCOUNTER — Ambulatory Visit
Admission: RE | Admit: 2016-12-13 | Discharge: 2016-12-13 | Disposition: A | Discharge status: Home / Self Care | Payer: Medicare HMO | Source: Ambulatory Visit | Attending: Radiation Oncology | Admitting: Radiation Oncology

## 2016-12-13 DIAGNOSIS — Z7951 Long term (current) use of inhaled steroids: Secondary | ICD-10-CM | POA: Diagnosis not present

## 2016-12-13 DIAGNOSIS — C50412 Malignant neoplasm of upper-outer quadrant of left female breast: Secondary | ICD-10-CM | POA: Diagnosis not present

## 2016-12-13 DIAGNOSIS — Z79899 Other long term (current) drug therapy: Secondary | ICD-10-CM | POA: Diagnosis not present

## 2016-12-13 DIAGNOSIS — Z7982 Long term (current) use of aspirin: Secondary | ICD-10-CM | POA: Diagnosis not present

## 2016-12-13 DIAGNOSIS — Z170 Estrogen receptor positive status [ER+]: Secondary | ICD-10-CM | POA: Diagnosis not present

## 2016-12-16 ENCOUNTER — Ambulatory Visit
Admission: RE | Admit: 2016-12-16 | Discharge: 2016-12-16 | Disposition: A | Discharge status: Home / Self Care | Payer: Medicare HMO | Source: Ambulatory Visit | Attending: Radiation Oncology | Admitting: Radiation Oncology

## 2016-12-16 DIAGNOSIS — Z170 Estrogen receptor positive status [ER+]: Secondary | ICD-10-CM | POA: Diagnosis not present

## 2016-12-16 DIAGNOSIS — Z79899 Other long term (current) drug therapy: Secondary | ICD-10-CM | POA: Diagnosis not present

## 2016-12-16 DIAGNOSIS — Z7982 Long term (current) use of aspirin: Secondary | ICD-10-CM | POA: Diagnosis not present

## 2016-12-16 DIAGNOSIS — Z7951 Long term (current) use of inhaled steroids: Secondary | ICD-10-CM | POA: Diagnosis not present

## 2016-12-16 DIAGNOSIS — C50412 Malignant neoplasm of upper-outer quadrant of left female breast: Secondary | ICD-10-CM | POA: Diagnosis not present

## 2016-12-17 ENCOUNTER — Ambulatory Visit
Admission: RE | Admit: 2016-12-17 | Discharge: 2016-12-17 | Disposition: A | Discharge status: Home / Self Care | Payer: Medicare HMO | Source: Ambulatory Visit | Attending: Radiation Oncology | Admitting: Radiation Oncology

## 2016-12-17 DIAGNOSIS — Z7951 Long term (current) use of inhaled steroids: Secondary | ICD-10-CM | POA: Diagnosis not present

## 2016-12-17 DIAGNOSIS — Z170 Estrogen receptor positive status [ER+]: Secondary | ICD-10-CM | POA: Diagnosis not present

## 2016-12-17 DIAGNOSIS — Z7982 Long term (current) use of aspirin: Secondary | ICD-10-CM | POA: Diagnosis not present

## 2016-12-17 DIAGNOSIS — C50412 Malignant neoplasm of upper-outer quadrant of left female breast: Secondary | ICD-10-CM | POA: Diagnosis not present

## 2016-12-17 DIAGNOSIS — Z79899 Other long term (current) drug therapy: Secondary | ICD-10-CM | POA: Diagnosis not present

## 2016-12-19 ENCOUNTER — Ambulatory Visit (HOSPITAL_BASED_OUTPATIENT_CLINIC_OR_DEPARTMENT_OTHER): Payer: Medicare HMO

## 2016-12-19 ENCOUNTER — Other Ambulatory Visit: Payer: Medicare HMO

## 2016-12-19 VITALS — BP 151/81 | HR 65 | Temp 98.2°F | Resp 20

## 2016-12-19 DIAGNOSIS — Z170 Estrogen receptor positive status [ER+]: Principal | ICD-10-CM

## 2016-12-19 DIAGNOSIS — Z5112 Encounter for antineoplastic immunotherapy: Secondary | ICD-10-CM | POA: Diagnosis not present

## 2016-12-19 DIAGNOSIS — C50412 Malignant neoplasm of upper-outer quadrant of left female breast: Secondary | ICD-10-CM

## 2016-12-19 DIAGNOSIS — C773 Secondary and unspecified malignant neoplasm of axilla and upper limb lymph nodes: Secondary | ICD-10-CM

## 2016-12-19 MED ORDER — SODIUM CHLORIDE 0.9 % IV SOLN
Freq: Once | INTRAVENOUS | Status: AC
  Administered 2016-12-19: 11:00:00 via INTRAVENOUS

## 2016-12-19 MED ORDER — HEPARIN SOD (PORK) LOCK FLUSH 100 UNIT/ML IV SOLN
500.0000 [IU] | Freq: Once | INTRAVENOUS | Status: AC | PRN
  Administered 2016-12-19: 500 [IU]
  Filled 2016-12-19: qty 5

## 2016-12-19 MED ORDER — ACETAMINOPHEN 325 MG PO TABS
650.0000 mg | ORAL_TABLET | Freq: Once | ORAL | Status: AC
  Administered 2016-12-19: 650 mg via ORAL

## 2016-12-19 MED ORDER — ACETAMINOPHEN 325 MG PO TABS
ORAL_TABLET | ORAL | Status: AC
  Filled 2016-12-19: qty 2

## 2016-12-19 MED ORDER — TRASTUZUMAB CHEMO 150 MG IV SOLR
900.0000 mg | Freq: Once | INTRAVENOUS | Status: AC
  Administered 2016-12-19: 900 mg via INTRAVENOUS
  Filled 2016-12-19: qty 42.86

## 2016-12-19 MED ORDER — SODIUM CHLORIDE 0.9% FLUSH
10.0000 mL | INTRAVENOUS | Status: DC | PRN
  Administered 2016-12-19: 10 mL
  Filled 2016-12-19: qty 10

## 2016-12-19 MED ORDER — DIPHENHYDRAMINE HCL 25 MG PO CAPS
ORAL_CAPSULE | ORAL | Status: AC
  Filled 2016-12-19: qty 2

## 2016-12-19 MED ORDER — DIPHENHYDRAMINE HCL 25 MG PO CAPS
50.0000 mg | ORAL_CAPSULE | Freq: Once | ORAL | Status: AC
  Administered 2016-12-19: 50 mg via ORAL

## 2016-12-19 MED ORDER — PERTUZUMAB CHEMO INJECTION 420 MG/14ML
420.0000 mg | Freq: Once | INTRAVENOUS | Status: AC
  Administered 2016-12-19: 420 mg via INTRAVENOUS
  Filled 2016-12-19: qty 14

## 2016-12-19 NOTE — Patient Instructions (Signed)
[facility] Discharge Instructions for Patients Receiving Chemotherapy  Today you received the following chemotherapy agents Herceptin and Perjeta   To help prevent nausea and vomiting after your treatment, we encourage you to take your nausea medication as directed.    If you develop nausea and vomiting that is not controlled by your nausea medication, call the clinic.   BELOW ARE SYMPTOMS THAT SHOULD BE REPORTED IMMEDIATELY:  *FEVER GREATER THAN 100.5 F  *CHILLS WITH OR WITHOUT FEVER  NAUSEA AND VOMITING THAT IS NOT CONTROLLED WITH YOUR NAUSEA MEDICATION  *UNUSUAL SHORTNESS OF BREATH  *UNUSUAL BRUISING OR BLEEDING  TENDERNESS IN MOUTH AND THROAT WITH OR WITHOUT PRESENCE OF ULCERS  *URINARY PROBLEMS  *BOWEL PROBLEMS  UNUSUAL RASH Items with * indicate a potential emergency and should be followed up as soon as possible.  Feel free to call the clinic you have any questions or concerns. The clinic phone number is [phone].  Please show the CHEMO ALERT CARD at check-in to the Emergency Department and triage nurse.   

## 2016-12-20 ENCOUNTER — Other Ambulatory Visit: Payer: Self-pay

## 2016-12-20 DIAGNOSIS — C50412 Malignant neoplasm of upper-outer quadrant of left female breast: Secondary | ICD-10-CM

## 2016-12-20 DIAGNOSIS — Z170 Estrogen receptor positive status [ER+]: Principal | ICD-10-CM

## 2016-12-20 NOTE — Progress Notes (Signed)
Pt recent echo shows decreased ef. Pt sent for cardiology referral per Dr.Gudena request.

## 2016-12-23 ENCOUNTER — Encounter: Payer: Self-pay | Admitting: Radiation Oncology

## 2016-12-23 NOTE — Progress Notes (Signed)
  Radiation Oncology         [phone] ________________________________  Name: [name] MRN: [id number]  Date: 12/23/2016  DOB: 07/06/1950  End of Treatment Note  Diagnosis:   Stage II-B (T1c, N1), grade 3, invasive ductal carcinoma of the left breast (ER+, PR-, HER2+)  Indication for treatment:  Curative       Radiation treatment dates:   10/31/16 - 12/17/16  Site/dose:    1) Left Breast treated to 45 Gy in 25 fractions 2) Left  Breast Boosted an additional 16 Gy in 8 fractions.  Beams/energy:    1)  3D  //  15X, 10X 2)  3D  //  15E  Narrative: The patient tolerated radiation treatment relatively well. She reported worsening fatigue throughout treatment, which she attributed to concurrent chemotherapy. The patient experienced radiation related skin changes including erythema over the left breast, low neck, and shoulder, as well as resolving peeling at the inframammary fold and axilla.   Plan: The patient has completed radiation treatment. The patient will return to radiation oncology clinic for routine followup in one month. I advised them to call or return sooner if they have any questions or concerns related to their recovery or treatment.  -----------------------------------  Blair Promise, PhD, MD  This document serves as a record of services personally performed by Gery Pray, MD. It was created on his behalf by Maryla Morrow, a trained medical scribe. The creation of this record is based on the scribe's personal observations and the provider's statements to them. This document has been checked and approved by the attending provider.

## 2016-12-24 ENCOUNTER — Other Ambulatory Visit: Payer: Self-pay | Admitting: Family Medicine

## 2016-12-27 ENCOUNTER — Ambulatory Visit (HOSPITAL_COMMUNITY)
Admission: RE | Admit: 2016-12-27 | Discharge: 2016-12-27 | Disposition: A | Discharge status: Home / Self Care | Payer: Medicare HMO | Source: Ambulatory Visit | Attending: Cardiology | Admitting: Cardiology

## 2016-12-27 ENCOUNTER — Encounter (HOSPITAL_COMMUNITY): Payer: Self-pay

## 2016-12-27 DIAGNOSIS — R0602 Shortness of breath: Secondary | ICD-10-CM

## 2016-12-27 DIAGNOSIS — R931 Abnormal findings on diagnostic imaging of heart and coronary circulation: Secondary | ICD-10-CM

## 2016-12-31 ENCOUNTER — Telehealth: Payer: Self-pay | Admitting: Cardiology

## 2016-12-31 NOTE — Telephone Encounter (Signed)
To Dr. [name] for further instruction since MRI could not be done due to body size.

## 2016-12-31 NOTE — Telephone Encounter (Signed)
-----   Message from [place] sent at 12/30/2016  7:58 AM EDT ----- Regarding: MRI   [name]  This patient did not have her cardiac MRI due to body habitus so it had to be cancelled.  [facility]

## 2016-12-31 NOTE — Telephone Encounter (Signed)
To [facility] to arrange Cardiac MRI at [facility].

## 2016-12-31 NOTE — Telephone Encounter (Signed)
Patient calling states that she could not have "heart MRI" completed on Friday and that she will have to go to a different facility with a larger machine.

## 2016-12-31 NOTE — Telephone Encounter (Signed)
Refer patient to [facility] for Cardiac MRI

## 2017-01-02 NOTE — Telephone Encounter (Signed)
Paperwork received and completed from [place].  Patient notified [facility] will call to schedule cardiac MRI. She was grateful for call.

## 2017-01-07 ENCOUNTER — Other Ambulatory Visit: Payer: Self-pay | Admitting: Family Medicine

## 2017-01-07 MED ORDER — ROSUVASTATIN CALCIUM 40 MG PO TABS: 40.0000 mg | ORAL_TABLET | Freq: Every day | ORAL | 0 refills | Status: DC

## 2017-01-07 NOTE — Telephone Encounter (Signed)
Sent in #20 of crestor to [facility] to cover her until mail order comes in/going on vacation and will not have enough to take with her.

## 2017-01-08 ENCOUNTER — Other Ambulatory Visit: Payer: Self-pay | Admitting: Family Medicine

## 2017-01-09 ENCOUNTER — Ambulatory Visit (HOSPITAL_BASED_OUTPATIENT_CLINIC_OR_DEPARTMENT_OTHER): Payer: Medicare HMO | Admitting: Hematology and Oncology

## 2017-01-09 ENCOUNTER — Ambulatory Visit: Payer: Medicare HMO

## 2017-01-09 ENCOUNTER — Encounter: Payer: Self-pay | Admitting: *Deleted

## 2017-01-09 ENCOUNTER — Other Ambulatory Visit (HOSPITAL_BASED_OUTPATIENT_CLINIC_OR_DEPARTMENT_OTHER): Payer: Medicare HMO

## 2017-01-09 ENCOUNTER — Ambulatory Visit (HOSPITAL_BASED_OUTPATIENT_CLINIC_OR_DEPARTMENT_OTHER): Payer: Medicare HMO

## 2017-01-09 ENCOUNTER — Encounter: Payer: Self-pay | Admitting: Hematology and Oncology

## 2017-01-09 VITALS — BP 138/66 | HR 62 | Temp 97.8°F | Resp 18

## 2017-01-09 DIAGNOSIS — Z170 Estrogen receptor positive status [ER+]: Principal | ICD-10-CM

## 2017-01-09 DIAGNOSIS — C50412 Malignant neoplasm of upper-outer quadrant of left female breast: Secondary | ICD-10-CM

## 2017-01-09 DIAGNOSIS — I509 Heart failure, unspecified: Secondary | ICD-10-CM

## 2017-01-09 DIAGNOSIS — Z5112 Encounter for antineoplastic immunotherapy: Secondary | ICD-10-CM

## 2017-01-09 DIAGNOSIS — R0602 Shortness of breath: Secondary | ICD-10-CM

## 2017-01-09 DIAGNOSIS — Z95828 Presence of other vascular implants and grafts: Secondary | ICD-10-CM

## 2017-01-09 LAB — COMPREHENSIVE METABOLIC PANEL
ALBUMIN: 3.2 g/dL — AB (ref 3.5–5.0)
ALK PHOS: 97 U/L (ref 40–150)
ALT: 36 U/L (ref 0–55)
ANION GAP: 13 meq/L — AB (ref 3–11)
AST: 40 U/L — AB (ref 5–34)
BUN: 16 mg/dL (ref 7.0–26.0)
CALCIUM: 9.6 mg/dL (ref 8.4–10.4)
CO2: 22 mEq/L (ref 22–29)
CREATININE: 0.9 mg/dL (ref 0.6–1.1)
Chloride: 105 mEq/L (ref 98–109)
EGFR: 67 mL/min/{1.73_m2} — ABNORMAL LOW (ref >90)
Glucose: 188 mg/dl — ABNORMAL HIGH (ref 70–140)
POTASSIUM: 3.5 meq/L (ref 3.5–5.1)
Sodium: 139 mEq/L (ref 136–145)
Total Bilirubin: 0.4 mg/dL (ref 0.20–1.20)
Total Protein: 7 g/dL (ref 6.4–8.3)

## 2017-01-09 LAB — CBC WITH DIFFERENTIAL/PLATELET
BASO%: 0.3 % (ref 0.0–2.0)
BASOS ABS: 0 10*3/uL (ref 0.0–0.1)
EOS ABS: 0.1 10*3/uL (ref 0.0–0.5)
EOS%: 1.1 % (ref 0.0–7.0)
HEMATOCRIT: 37.3 % (ref 34.8–46.6)
HEMOGLOBIN: 11.7 g/dL (ref 11.6–15.9)
LYMPH#: 1 10*3/uL (ref 0.9–3.3)
LYMPH%: 15.2 % (ref 14.0–49.7)
MCH: 26.6 pg (ref 25.1–34.0)
MCHC: 31.4 g/dL — ABNORMAL LOW (ref 31.5–36.0)
MCV: 84.7 fL (ref 79.5–101.0)
MONO#: 0.4 10*3/uL (ref 0.1–0.9)
MONO%: 5.7 % (ref 0.0–14.0)
NEUT#: 5.1 10*3/uL (ref 1.5–6.5)
NEUT%: 77.7 % — ABNORMAL HIGH (ref 38.4–76.8)
PLATELETS: 255 10*3/uL (ref 145–400)
RBC: 4.4 10*6/uL (ref 3.70–5.45)
RDW: 16.9 % — AB (ref 11.2–14.5)
WBC: 6.5 10*3/uL (ref 3.9–10.3)

## 2017-01-09 MED ORDER — TRASTUZUMAB CHEMO 150 MG IV SOLR
900.0000 mg | Freq: Once | INTRAVENOUS | Status: AC
  Administered 2017-01-09: 900 mg via INTRAVENOUS
  Filled 2017-01-09: qty 42.86

## 2017-01-09 MED ORDER — ACETAMINOPHEN 325 MG PO TABS
650.0000 mg | ORAL_TABLET | Freq: Once | ORAL | Status: AC
  Administered 2017-01-09: 650 mg via ORAL

## 2017-01-09 MED ORDER — PERTUZUMAB CHEMO INJECTION 420 MG/14ML
420.0000 mg | Freq: Once | INTRAVENOUS | Status: AC
  Administered 2017-01-09: 420 mg via INTRAVENOUS
  Filled 2017-01-09: qty 14

## 2017-01-09 MED ORDER — DIPHENHYDRAMINE HCL 25 MG PO CAPS
ORAL_CAPSULE | ORAL | Status: AC
  Filled 2017-01-09: qty 2

## 2017-01-09 MED ORDER — HEPARIN SOD (PORK) LOCK FLUSH 100 UNIT/ML IV SOLN
500.0000 [IU] | Freq: Once | INTRAVENOUS | Status: AC | PRN
  Administered 2017-01-09: 500 [IU]
  Filled 2017-01-09: qty 5

## 2017-01-09 MED ORDER — ACETAMINOPHEN 325 MG PO TABS
ORAL_TABLET | ORAL | Status: AC
  Filled 2017-01-09: qty 2

## 2017-01-09 MED ORDER — ANASTROZOLE 1 MG PO TABS: 1.0000 mg | ORAL_TABLET | Freq: Every day | ORAL | 3 refills | Status: DC

## 2017-01-09 MED ORDER — SODIUM CHLORIDE 0.9% FLUSH
10.0000 mL | INTRAVENOUS | Status: DC | PRN
  Administered 2017-01-09: 10 mL
  Filled 2017-01-09: qty 10

## 2017-01-09 MED ORDER — SODIUM CHLORIDE 0.9 % IV SOLN
Freq: Once | INTRAVENOUS | Status: AC
  Administered 2017-01-09: 11:00:00 via INTRAVENOUS

## 2017-01-09 MED ORDER — SODIUM CHLORIDE 0.9% FLUSH
10.0000 mL | INTRAVENOUS | Status: DC | PRN
  Administered 2017-01-09: 10 mL via INTRAVENOUS
  Filled 2017-01-09: qty 10

## 2017-01-09 MED ORDER — DIPHENHYDRAMINE HCL 25 MG PO CAPS
50.0000 mg | ORAL_CAPSULE | Freq: Once | ORAL | Status: AC
  Administered 2017-01-09: 50 mg via ORAL

## 2017-01-09 NOTE — Assessment & Plan Note (Signed)
06/21/16: Left lumpectomy: IDC grade 3, 1.1 cm, 1/5 LN Positive with ECE, Margins Neg, LVI Present; Er 30%, PR 0%, Ki 67 40%, Her 2 Positive Ratio 2.71; T1CN1 (Stage 2B)  Patient has multiple comorbidities including history of LVH with congestive heart failure  Treatment Plan: 1. Adjuvant Taxol with Herceptin And Perjeta (Last echocardiogram showed an ejection fraction of 55-60%-- repeat on 4/3/2018EF 60-65%)  2. Followed by adjuvant radiation 3. Followed by adjuvant antiestrogen therapy with anastrozole 1 mg daily 5 years (bone density 04/29/2016 T score -1.2) ----------------------------------------------------------------------------------------------------------------------------------------------------- Current Treatment: Completed 12 cycles ofweeklyTaxol on 10/10/2016;Herceptin and Perjeta Q 3 weeks  Continue with Herceptin and Perjeta every 3 weeks Echocardiogram showed EF of 50-55 % on 12/09/2016 Currently on adjuvant radiation.  Shortness of breath:   I discussed with her the recently presented clinical trial PERSEPHONE suggests that there was no difference between 12 months versus 6 months of adjuvant Herceptin. I believe that we can extrapolate these results to the Herceptin and Perjeta maintenance as well. We could stop her treatment in August based on these trial results.

## 2017-01-09 NOTE — Patient Instructions (Signed)
[facility] Discharge Instructions for Patients Receiving Chemotherapy  Today you received the following chemotherapy agents Herceptin and Perjeta   To help prevent nausea and vomiting after your treatment, we encourage you to take your nausea medication as directed.    If you develop nausea and vomiting that is not controlled by your nausea medication, call the clinic.   BELOW ARE SYMPTOMS THAT SHOULD BE REPORTED IMMEDIATELY:  *FEVER GREATER THAN 100.5 F  *CHILLS WITH OR WITHOUT FEVER  NAUSEA AND VOMITING THAT IS NOT CONTROLLED WITH YOUR NAUSEA MEDICATION  *UNUSUAL SHORTNESS OF BREATH  *UNUSUAL BRUISING OR BLEEDING  TENDERNESS IN MOUTH AND THROAT WITH OR WITHOUT PRESENCE OF ULCERS  *URINARY PROBLEMS  *BOWEL PROBLEMS  UNUSUAL RASH Items with * indicate a potential emergency and should be followed up as soon as possible.  Feel free to call the clinic you have any questions or concerns. The clinic phone number is [phone].  Please show the CHEMO ALERT CARD at check-in to the Emergency Department and triage nurse.   

## 2017-01-09 NOTE — Progress Notes (Signed)
Patient Care Team: [name], MD as PCP - General (Family Medicine) Fanny Skates, MD as Consulting Physician (General Surgery) Nicholas Lose, MD as Consulting Physician (Hematology and Oncology) Gery Pray, MD as Consulting Physician (Radiation Oncology) [name], NP as Nurse Practitioner (Hematology and Oncology)  DIAGNOSIS:  Encounter Diagnosis  Name Primary?  . Malignant neoplasm of upper-outer quadrant of left breast in female, estrogen receptor positive ([place])     SUMMARY OF ONCOLOGIC HISTORY:   Malignant neoplasm of upper-outer quadrant of left female breast ([place])   05/13/2016 Initial Diagnosis    Left breast biopsy 3:00: IDC, grade 3, ER 30%, PR 0%, Ki-67 40%, HER-2 positive ratio 2.71, screening detected left breast mass 9 x 7 x 6 mm, axilla negative, T1 BN 0 stage IA clinical stage      06/21/2016 Surgery    Left lumpectomy: IDC grade 3, 1.1 cm, 1/5 LN Positive with ECE, Margins Neg, LVI Present; Er 30%, PR 0%, Ki 67 40%, Her 2 Positive Ratio 2.71; T1CN1 (Stage 2B)      07/25/2016 - 10/10/2016 Chemotherapy    Taxol weekly 12; Herceptin and Perjeta every 3 weeks       10/31/2016 - 11/28/2016 Radiation Therapy    Adjuvant radiation therapy       CHIEF COMPLIANT: Shortness of breath  INTERVAL HISTORY: [name] is a 66 year old with above-mentioned history of left breast cancer who underwent lumpectomy and is currently on adjuvant chemotherapy. Today will be her last dose of Herceptin and Perjeta based upon the new clinical trial results with suggestive that there was no difference in 6 months versus one year of anti-HER-2 therapy. She continues to have issues with shortness of breath. She has seen cardiology. A recent echocardiogram showed an EF of 50-55% which is a decline from before.  REVIEW OF SYSTEMS:   Constitutional: Denies fevers, chills or abnormal weight loss Eyes: Denies blurriness of vision Ears, nose, mouth, throat, and  face: Denies mucositis or sore throat Respiratory: Shortness of breath Cardiovascular: Denies palpitation, chest discomfort Gastrointestinal:  Denies nausea, heartburn or change in bowel habits Skin: Denies abnormal skin rashes Lymphatics: Denies new lymphadenopathy or easy bruising Neurological:Denies numbness, tingling or new weaknesses Behavioral/Psych: Mood is stable, no new changes  Extremities: No lower extremity edema Breast: Radiation dermatitis All other systems were reviewed with the patient and are negative.  I have reviewed the past medical history, past surgical history, social history and family history with the patient and they are unchanged from previous note.  ALLERGIES:  is allergic to no known allergies.  MEDICATIONS:  Current Outpatient Prescriptions  Medication Sig Dispense Refill  . acetaminophen (TYLENOL) 500 MG tablet Take 1,000 mg by mouth 2 (two) times daily as needed for moderate pain or headache.    . albuterol (PROVENTIL HFA;VENTOLIN HFA) 108 (90 Base) MCG/ACT inhaler Inhale 2 puffs into the lungs every 6 (six) hours as needed for wheezing or shortness of breath. 1 Inhaler 6  . aspirin EC 81 MG tablet Take 81 mg by mouth at bedtime.     . Azelastine & Fluticasone 137 & 50 MCG/ACT THPK Place 1 spray into the nose daily. 1 each 5  . beclomethasone (QVAR) 80 MCG/ACT inhaler Inhale 1-2 puffs into the lungs 2 (two) times daily as needed (shortness of breath). 8.7 g 1  . BYSTOLIC 10 MG tablet TAKE 1 TABLET AT BEDTIME 90 tablet 2  . CARTIA XT 120 MG 24 hr capsule TAKE 1 CAPSULE EVERY  DAY 90 capsule 2  . clotrimazole-betamethasone (LOTRISONE) cream Apply 1 application topically 2 (two) times daily as needed (for rash). 30 g 0  . diclofenac (VOLTAREN) 75 MG EC tablet TAKE 1 TABLET TWICE DAILY 180 tablet 1  . emollient (BIAFINE) cream Apply topically 2 (two) times daily.    Marland Kitchen escitalopram (LEXAPRO) 20 MG tablet Take 1 tablet (20 mg total) by mouth daily. 90 tablet 1    . esomeprazole (NEXIUM) 40 MG capsule Take 40 mg by mouth daily.     . fluticasone (FLOVENT HFA) 110 MCG/ACT inhaler Inhale 1 puff into the lungs 2 (two) times daily. For Wheezing and COPD 1 Inhaler 3  . furosemide (LASIX) 20 MG tablet TAKE 1 TABLET EVERY DAY 90 tablet 2  . hyaluronate sodium (RADIAPLEXRX) GEL Apply 1 application topically 2 (two) times daily.    . Hypromellose (ARTIFICIAL TEARS OP) Apply 1 drop to eye daily as needed (dry eyes).    Marland Kitchen lidocaine-prilocaine (EMLA) cream Apply to affected area once 30 g 3  . LORazepam (ATIVAN) 0.5 MG tablet Take 1 tablet (0.5 mg total) by mouth every 6 (six) hours as needed (Nausea or vomiting). 30 tablet 0  . losartan (COZAAR) 100 MG tablet TAKE 1 TABLET EVERY DAY 90 tablet 2  . metFORMIN (GLUCOPHAGE) 500 MG tablet Take 1 tablet (500 mg total) by mouth 2 (two) times daily with a meal. 60 tablet 3  . montelukast (SINGULAIR) 10 MG tablet TAKE 1 TABLET AT BEDTIME 90 tablet 2  . non-metallic deodorant (ALRA) MISC Apply 1 application topically daily as needed.    . ondansetron (ZOFRAN) 8 MG tablet Take 1 tablet (8 mg total) by mouth 2 (two) times daily as needed (Nausea or vomiting). 30 tablet 1  . prochlorperazine (COMPAZINE) 10 MG tablet Take 1 tablet (10 mg total) by mouth every 6 (six) hours as needed (Nausea or vomiting). 30 tablet 3  . rosuvastatin (CRESTOR) 20 MG tablet TAKE 1 TABLET AT BEDTIME 90 tablet 0  . rosuvastatin (CRESTOR) 40 MG tablet Take 1 tablet (40 mg total) by mouth daily. 20 tablet 0  . silver sulfADIAZINE (SILVADENE) 1 % cream Apply 1 application topically 2 (two) times daily.    . traMADol (ULTRAM) 50 MG tablet Take 1 tablet (50 mg total) by mouth at bedtime as needed. 20 tablet 0  . triamcinolone (KENALOG) 0.025 % ointment Apply 1 application topically 2 (two) times daily. 30 g 0   No current facility-administered medications for this visit.     PHYSICAL EXAMINATION: ECOG PERFORMANCE STATUS: 1 - Symptomatic but completely  ambulatory  Vitals:   01/09/17 0945  BP: (!) 152/71  Pulse: 77  Resp: 19  Temp: 98.4 F (36.9 C)   Filed Weights   01/09/17 0945  Weight: (!) 336 lb 3.2 oz (152.5 kg)    GENERAL:alert, no distress and comfortable SKIN: skin color, texture, turgor are normal, no rashes or significant lesions EYES: normal, Conjunctiva are pink and non-injected, sclera clear OROPHARYNX:no exudate, no erythema and lips, buccal mucosa, and tongue normal  NECK: supple, thyroid normal size, non-tender, without nodularity LYMPH:  no palpable lymphadenopathy in the cervical, axillary or inguinal LUNGS: clear to auscultation and percussion with normal breathing effort HEART: regular rate & rhythm and no murmurs and no lower extremity edema ABDOMEN:abdomen soft, non-tender and normal bowel sounds MUSCULOSKELETAL:no cyanosis of digits and no clubbing  NEURO: alert & oriented x 3 with fluent speech, no focal motor/sensory deficits EXTREMITIES: No lower extremity edema  BREAST: Radiation dermatitis  LABORATORY DATA:  I have reviewed the data as listed   Chemistry      Component Value Date/Time   NA 139 01/09/2017 0904   K 3.5 01/09/2017 0904   CL 104 07/16/2016 0716   CO2 22 01/09/2017 0904   BUN 16.0 01/09/2017 0904   CREATININE 0.9 01/09/2017 0904      Component Value Date/Time   CALCIUM 9.6 01/09/2017 0904   ALKPHOS 97 01/09/2017 0904   AST 40 (H) 01/09/2017 0904   ALT 36 01/09/2017 0904   BILITOT 0.40 01/09/2017 0904       Lab Results  Component Value Date   WBC 6.5 01/09/2017   HGB 11.7 01/09/2017   HCT 37.3 01/09/2017   MCV 84.7 01/09/2017   PLT 255 01/09/2017   NEUTROABS 5.1 01/09/2017    ASSESSMENT & PLAN:  Malignant neoplasm of upper-outer quadrant of left female breast ([place]) 06/21/16: Left lumpectomy: IDC grade 3, 1.1 cm, 1/5 LN Positive with ECE, Margins Neg, LVI Present; Er 30%, PR 0%, Ki 67 40%, Her 2 Positive Ratio 2.71; T1CN1 (Stage 2B)  Patient has multiple  comorbidities including history of LVH with congestive heart failure  Treatment Plan: 1. Adjuvant Taxol with Herceptin And Perjeta (Last echocardiogram showed an ejection fraction of 55-60%-- repeat on 4/3/2018EF 60-65%)  2. Followed by adjuvant radiation 3. Followed by adjuvant antiestrogen therapy with anastrozole 1 mg daily 5 years (bone density 04/29/2016 T score -1.2) ----------------------------------------------------------------------------------------------------------------------------------------------------- Current Treatment: Completed 12 cycles ofweeklyTaxol on 10/10/2016;Herceptin and Perjeta Q 3 weeks  Continue with Herceptin and Perjeta every 3 weeks Echocardiogram showed EF of 50-55 % on 12/09/2016 Currently on adjuvant radiation.  Shortness of breath: Getting workup with cardiology.   I discussed with her the recently presented clinical trial PERSEPHONE suggests that there was no difference between 12 months versus 6 months of adjuvant Herceptin. I believe that we can extrapolate these results to the Herceptin and Perjeta maintenance as well.   Today will be the last of her treatments with Herceptin and Perjeta. Treatment plan: Anastrozole 1 mg daily started 01/22/2017 We discussed the risks and benefits of anti-estrogen therapy with aromatase inhibitors. These include but not limited to insomnia, hot flashes, mood changes, vaginal dryness, bone density loss, and weight gain. We strongly believe that the benefits far outweigh the risks. Patient understands these risks and consented to starting treatment. Planned treatment duration is 5 years.  I spent 25 minutes talking to the patient of which more than half was spent in counseling and coordination of care.  No orders of the defined types were placed in this encounter.  The patient has a good understanding of the overall plan. she agrees with it. she will call with any problems that may develop before the next visit  here.   Rulon Eisenmenger, MD 01/09/17

## 2017-01-09 NOTE — Progress Notes (Signed)
Patient refused to stay for 30 min observation. Patient and VS stable upon discharge.

## 2017-01-14 NOTE — Telephone Encounter (Signed)
[name] has an appointment on 01-29-17 @ 3 pm for Cardiac MRI.

## 2017-01-16 ENCOUNTER — Other Ambulatory Visit: Payer: Self-pay | Admitting: General Surgery

## 2017-01-17 ENCOUNTER — Encounter: Payer: Self-pay | Admitting: Oncology

## 2017-01-20 ENCOUNTER — Ambulatory Visit
Admission: RE | Admit: 2017-01-20 | Discharge: 2017-01-20 | Disposition: A | Discharge status: Home / Self Care | Payer: Medicare HMO | Source: Ambulatory Visit | Attending: Radiation Oncology | Admitting: Radiation Oncology

## 2017-01-20 DIAGNOSIS — Z7951 Long term (current) use of inhaled steroids: Secondary | ICD-10-CM | POA: Insufficient documentation

## 2017-01-20 DIAGNOSIS — Z79899 Other long term (current) drug therapy: Secondary | ICD-10-CM | POA: Diagnosis not present

## 2017-01-20 DIAGNOSIS — C50412 Malignant neoplasm of upper-outer quadrant of left female breast: Secondary | ICD-10-CM

## 2017-01-20 DIAGNOSIS — Z170 Estrogen receptor positive status [ER+]: Secondary | ICD-10-CM | POA: Insufficient documentation

## 2017-01-20 DIAGNOSIS — Z7982 Long term (current) use of aspirin: Secondary | ICD-10-CM | POA: Insufficient documentation

## 2017-01-20 NOTE — Progress Notes (Signed)
Radiation Oncology         [phone] ________________________________  Name: [name] MRN: [id number]  Date: 01/20/2017  DOB: [birth year]  Follow-Up Visit Note  CC: [name], MD  Nicholas Lose, MD    ICD-10-CM   1. Malignant neoplasm of upper-outer quadrant of left breast in female, estrogen receptor positive ([place]) C50.412    Z17.0     Diagnosis:   66 y.o. woman with Stage II-B (T1c, N1), grade 3, invasive ductal carcinoma of the left breast (ER+, PR-, HER2+).  Interval Since Last Radiation:  4 weeks   10/31/16 - 12/17/16  Site/dose:    1) Left Breast treated to 45 Gy in 25 fractions 2) Left  Breast Boosted an additional 16 Gy in 8 fractions.  Beams/energy:    1)  3D  //  15X, 10X 2)  3D  //  15E  Narrative:  The patient returns today for routine follow-up.  She reported that things were improving, and feels much better. She just started on Armidex last night (01/19/17). She reports feeling calm and skin has not been giving her trouble. Breast does feel sore at times, with ocasional  discomfort, does not take pain medication for the breast discomfort.                   ALLERGIES:  is allergic to no known allergies.  Meds: Current Outpatient Prescriptions  Medication Sig Dispense Refill  . acetaminophen (TYLENOL) 500 MG tablet Take 1,000 mg by mouth 2 (two) times daily as needed for moderate pain or headache.    . albuterol (PROVENTIL HFA;VENTOLIN HFA) 108 (90 Base) MCG/ACT inhaler Inhale 2 puffs into the lungs every 6 (six) hours as needed for wheezing or shortness of breath. 1 Inhaler 6  . anastrozole (ARIMIDEX) 1 MG tablet Take 1 tablet (1 mg total) by mouth daily. 90 tablet 3  . aspirin EC 81 MG tablet Take 81 mg by mouth at bedtime.     . Azelastine & Fluticasone 137 & 50 MCG/ACT THPK Place 1 spray into the nose daily. 1 each 5  . BYSTOLIC 10 MG tablet TAKE 1 TABLET AT BEDTIME 90 tablet 2  . CARTIA XT 120 MG 24 hr capsule TAKE 1 CAPSULE EVERY DAY 90  capsule 2  . clotrimazole-betamethasone (LOTRISONE) cream Apply 1 application topically 2 (two) times daily as needed (for rash). 30 g 0  . diclofenac (VOLTAREN) 75 MG EC tablet TAKE 1 TABLET TWICE DAILY 180 tablet 1  . emollient (BIAFINE) cream Apply topically 2 (two) times daily.    Marland Kitchen escitalopram (LEXAPRO) 20 MG tablet Take 1 tablet (20 mg total) by mouth daily. 90 tablet 1  . esomeprazole (NEXIUM) 40 MG capsule Take 40 mg by mouth daily.     . fluticasone (FLOVENT HFA) 110 MCG/ACT inhaler Inhale 1 puff into the lungs 2 (two) times daily. For Wheezing and COPD 1 Inhaler 3  . furosemide (LASIX) 20 MG tablet TAKE 1 TABLET EVERY DAY 90 tablet 2  . hyaluronate sodium (RADIAPLEXRX) GEL Apply 1 application topically 2 (two) times daily.    . Hypromellose (ARTIFICIAL TEARS OP) Apply 1 drop to eye daily as needed (dry eyes).    Marland Kitchen lidocaine-prilocaine (EMLA) cream Apply to affected area once 30 g 3  . LORazepam (ATIVAN) 0.5 MG tablet Take 1 tablet (0.5 mg total) by mouth every 6 (six) hours as needed (Nausea or vomiting). 30 tablet 0  . losartan (COZAAR) 100 MG tablet TAKE  1 TABLET EVERY DAY 90 tablet 2  . metFORMIN (GLUCOPHAGE) 500 MG tablet Take 1 tablet (500 mg total) by mouth 2 (two) times daily with a meal. 60 tablet 3  . montelukast (SINGULAIR) 10 MG tablet TAKE 1 TABLET AT BEDTIME 90 tablet 2  . rosuvastatin (CRESTOR) 40 MG tablet Take 1 tablet (40 mg total) by mouth daily. 20 tablet 0  . beclomethasone (QVAR) 80 MCG/ACT inhaler Inhale 1-2 puffs into the lungs 2 (two) times daily as needed (shortness of breath). (Patient not taking: Reported on 01/20/2017) 8.7 g 1  . non-metallic deodorant (ALRA) MISC Apply 1 application topically daily as needed.    . ondansetron (ZOFRAN) 8 MG tablet Take 1 tablet (8 mg total) by mouth 2 (two) times daily as needed (Nausea or vomiting). (Patient not taking: Reported on 01/20/2017) 30 tablet 1  . prochlorperazine (COMPAZINE) 10 MG tablet Take 1 tablet (10 mg total)  by mouth every 6 (six) hours as needed (Nausea or vomiting). (Patient not taking: Reported on 01/20/2017) 30 tablet 3  . silver sulfADIAZINE (SILVADENE) 1 % cream Apply 1 application topically 2 (two) times daily.    . traMADol (ULTRAM) 50 MG tablet Take 1 tablet (50 mg total) by mouth at bedtime as needed. (Patient not taking: Reported on 01/20/2017) 20 tablet 0  . triamcinolone (KENALOG) 0.025 % ointment Apply 1 application topically 2 (two) times daily. (Patient not taking: Reported on 01/20/2017) 30 g 0   No current facility-administered medications for this encounter.     Physical Findings: The patient is in no acute distress. Patient is alert and oriented.  weight is 335 lb 4 oz (152.1 kg) (abnormal). Her oral temperature is 98.4 F (36.9 C). Her blood pressure is 113/51 (abnormal) and her pulse is 70. Her respiration is 18 and oxygen saturation is 97%. . No significant changes.  Left breast has no palpable mass, skin has healed well with some hyperpigmentation and mild edema.    Lungs are clear to auscultation bilaterally. Heart has regular rate and rhythm. No palpable cervical, supraclavicular, or axillary adenopathy. Abdomen soft, non-tender, normal bowel sounds.   Lab Findings: Lab Results  Component Value Date   WBC 6.5 01/09/2017   HGB 11.7 01/09/2017   HCT 37.3 01/09/2017   MCV 84.7 01/09/2017   PLT 255 01/09/2017    Radiographic Findings: No results found.  Impression:  The patient is recovering from the effects of radiation.  No evidence on reoccurrence in clinical exam  Plan:  PRN follow up with Radiation Oncology and will continue close follow up with Medical Oncology and Surgery.  ____________________________________ -----------------------------------  Blair Promise, PhD, MD  This document serves as a record of services personally performed by Gery Pray MD. It was created on his behalf by Delton Coombes, a trained medical scribe. The creation of this  record is based on the scribe's personal observations and the provider's statements to them. This document has been checked and approved by the attending provider.

## 2017-01-20 NOTE — Progress Notes (Signed)
[name] is here for follow up.  She reports having some pain in her left breast especially when bending over.  She reports her energy level is improving.  She continues to have shortness of breath.  She is started taking Arimidex yesterday.  The skin on her left shoulder and subclavian is intact.  The skin on her left breast is pink.  She has some dry peeling around her nipple area.  BP (!) 113/51 (BP Location: Right Arm, Patient Position: Sitting)   Pulse 70   Temp 98.4 F (36.9 C) (Oral)   Resp 18   Wt (!) 335 lb 4 oz (152.1 kg)   SpO2 97%   BMI 55.79 kg/m    Wt Readings from Last 3 Encounters:  01/20/17 (!) 335 lb 4 oz (152.1 kg)  01/09/17 (!) 336 lb 3.2 oz (152.5 kg)  12/09/16 (!) 335 lb 9.6 oz (152.2 kg)

## 2017-01-21 NOTE — Pre-Procedure Instructions (Signed)
[name]  01/21/2017      [facility] - [place], [place] [address] Phone: [phone] Fax: [phone] - [place], [place] - [name] [place] [address] Phone: [phone] Fax: [phone]  [place], [place] - [place] [address] Phone: [phone] Fax: [phone]    Your procedure is scheduled on August 3  Report to [name] at [place].M.  Call this number if you have problems the morning of surgery:  [phone]   Remember:  Do not eat food or drink liquids after midnight.   Take these medicines the morning of surgery with A SIP OF WATER acetaminophen (TYLENOL), albuterol (PROVENTIL HFA;VENTOLIN HFA), anastrozole (ARIMIDEX), Azelastine & Fluticasone, beclomethasone (QVAR, escitalopram (LEXAPRO), esomeprazole (NEXIUM), fluticasone (FLOVENT HFA) , eye drops if needed, LORazepam (ATIVAN),    7 days prior to surgery STOP taking any Aleve, Naproxen, Ibuprofen, Motrin, Advil, Goody's, BC's, all herbal medications, fish oil, and all vitamins  Follow your doctors instructions regarding your Aspirin.  If no instructions were given by the doctor you will need to call the office to get instructions.  Your pre admission RN will also call for those instructions  WHAT DO I DO ABOUT MY DIABETES MEDICATION?   Marland Kitchen Do not take oral diabetes medicines (pills) the morning of surgery.metFORMIN (GLUCOPHAGE)    How to Manage Your Diabetes Before and After Surgery  Why is it important to control my blood sugar before and after surgery? . Improving blood sugar levels before and after surgery helps healing and can limit problems. . A way of improving blood sugar control is eating a healthy diet by: o  Eating less sugar and carbohydrates o  Increasing  activity/exercise o  Talking with your doctor about reaching your blood sugar goals . High blood sugars (greater than 180 mg/dL) can raise your risk of infections and slow your recovery, so you will need to focus on controlling your diabetes during the weeks before surgery. . Make sure that the doctor who takes care of your diabetes knows about your planned surgery including the date and location.  How do I manage my blood sugar before surgery? . Check your blood sugar at least 4 times a day, starting 2 days before surgery, to make sure that the level is not too high or low. o Check your blood sugar the morning of your surgery when you wake up and every 2 hours until you get to the Short Stay unit. . If your blood sugar is less than 70 mg/dL, you will need to treat for low blood sugar: o Do not take insulin. o Treat a low blood sugar (less than 70 mg/dL) with  cup of clear juice (cranberry or apple), 4 glucose tablets, OR glucose gel. o Recheck blood sugar in 15 minutes after treatment (to make sure it is greater than 70 mg/dL). If your blood sugar is not greater than 70 mg/dL on recheck, call [phone] for further instructions. . Report your blood sugar to the short stay nurse when you get to Short Stay.  . If you are admitted to the hospital after surgery: o Your blood sugar will be checked by the staff and you will probably be given insulin after surgery (instead of oral diabetes medicines) to make sure you have good blood sugar  levels. o The goal for blood sugar control after surgery is 80-180 mg/dL     Do not wear jewelry, make-up or nail polish.  Do not wear lotions, powders, or perfumes, or deoderant.  Do not shave 48 hours prior to surgery.  Men may shave face and neck.  Do not bring valuables to the hospital.  [facility] is not responsible for any belongings or valuables.  Contacts, dentures or bridgework may not be worn into surgery.  Leave your suitcase in the car.  After  surgery it may be brought to your room.  For patients admitted to the hospital, discharge time will be determined by your treatment team.  Patients discharged the day of surgery will not be allowed to drive home.    Special instructions:   [place]- Preparing For Surgery  Before surgery, you can play an important role. Because skin is not sterile, your skin needs to be as free of germs as possible. You can reduce the number of germs on your skin by washing with CHG (chlorahexidine gluconate) Soap before surgery.  CHG is an antiseptic cleaner which kills germs and bonds with the skin to continue killing germs even after washing.  Please do not use if you have an allergy to CHG or antibacterial soaps. If your skin becomes reddened/irritated stop using the CHG.  Do not shave (including legs and underarms) for at least 48 hours prior to first CHG shower. It is OK to shave your face.  Please follow these instructions carefully.   1. Shower the NIGHT BEFORE SURGERY and the MORNING OF SURGERY with CHG.   2. If you chose to wash your hair, wash your hair first as usual with your normal shampoo.  3. After you shampoo, rinse your hair and body thoroughly to remove the shampoo.  4. Use CHG as you would any other liquid soap. You can apply CHG directly to the skin and wash gently with a scrungie or a clean washcloth.   5. Apply the CHG Soap to your body ONLY FROM THE NECK DOWN.  Do not use on open wounds or open sores. Avoid contact with your eyes, ears, mouth and genitals (private parts). Wash genitals (private parts) with your normal soap.  6. Wash thoroughly, paying special attention to the area where your surgery will be performed.  7. Thoroughly rinse your body with warm water from the neck down.  8. DO NOT shower/wash with your normal soap after using and rinsing off the CHG Soap.  9. Pat yourself dry with a CLEAN TOWEL.   10. Wear CLEAN PAJAMAS   11. Place CLEAN SHEETS on your bed  the night of your first shower and DO NOT SLEEP WITH PETS.    Day of Surgery: Do not apply any deodorants/lotions. Please wear clean clothes to the hospital/surgery center.      Please read over the following fact sheets that you were given.

## 2017-01-22 ENCOUNTER — Encounter (HOSPITAL_COMMUNITY)
Admission: RE | Admit: 2017-01-22 | Discharge: 2017-01-22 | Disposition: A | Discharge status: Home / Self Care | Payer: Medicare HMO | Source: Ambulatory Visit | Attending: General Surgery | Admitting: General Surgery

## 2017-01-22 ENCOUNTER — Encounter (HOSPITAL_COMMUNITY): Payer: Self-pay

## 2017-01-22 DIAGNOSIS — Z7951 Long term (current) use of inhaled steroids: Secondary | ICD-10-CM | POA: Diagnosis not present

## 2017-01-22 DIAGNOSIS — Z923 Personal history of irradiation: Secondary | ICD-10-CM | POA: Diagnosis not present

## 2017-01-22 DIAGNOSIS — Z7982 Long term (current) use of aspirin: Secondary | ICD-10-CM | POA: Diagnosis not present

## 2017-01-22 DIAGNOSIS — Z7984 Long term (current) use of oral hypoglycemic drugs: Secondary | ICD-10-CM | POA: Diagnosis not present

## 2017-01-22 DIAGNOSIS — E1151 Type 2 diabetes mellitus with diabetic peripheral angiopathy without gangrene: Secondary | ICD-10-CM | POA: Diagnosis not present

## 2017-01-22 DIAGNOSIS — Z791 Long term (current) use of non-steroidal anti-inflammatories (NSAID): Secondary | ICD-10-CM | POA: Diagnosis not present

## 2017-01-22 DIAGNOSIS — C773 Secondary and unspecified malignant neoplasm of axilla and upper limb lymph nodes: Secondary | ICD-10-CM | POA: Diagnosis not present

## 2017-01-22 DIAGNOSIS — C50412 Malignant neoplasm of upper-outer quadrant of left female breast: Secondary | ICD-10-CM | POA: Diagnosis not present

## 2017-01-22 DIAGNOSIS — Z170 Estrogen receptor positive status [ER+]: Secondary | ICD-10-CM | POA: Diagnosis not present

## 2017-01-22 DIAGNOSIS — Z79899 Other long term (current) drug therapy: Secondary | ICD-10-CM | POA: Diagnosis not present

## 2017-01-22 DIAGNOSIS — Z9221 Personal history of antineoplastic chemotherapy: Secondary | ICD-10-CM | POA: Diagnosis not present

## 2017-01-22 DIAGNOSIS — I509 Heart failure, unspecified: Secondary | ICD-10-CM | POA: Diagnosis not present

## 2017-01-22 DIAGNOSIS — G4730 Sleep apnea, unspecified: Secondary | ICD-10-CM | POA: Diagnosis not present

## 2017-01-22 DIAGNOSIS — M1711 Unilateral primary osteoarthritis, right knee: Secondary | ICD-10-CM | POA: Diagnosis not present

## 2017-01-22 DIAGNOSIS — I110 Hypertensive heart disease with heart failure: Secondary | ICD-10-CM | POA: Diagnosis not present

## 2017-01-22 DIAGNOSIS — K219 Gastro-esophageal reflux disease without esophagitis: Secondary | ICD-10-CM | POA: Diagnosis not present

## 2017-01-22 DIAGNOSIS — J45909 Unspecified asthma, uncomplicated: Secondary | ICD-10-CM | POA: Diagnosis not present

## 2017-01-22 DIAGNOSIS — Z452 Encounter for adjustment and management of vascular access device: Secondary | ICD-10-CM | POA: Diagnosis not present

## 2017-01-22 DIAGNOSIS — I2510 Atherosclerotic heart disease of native coronary artery without angina pectoris: Secondary | ICD-10-CM | POA: Diagnosis not present

## 2017-01-22 DIAGNOSIS — Z79811 Long term (current) use of aromatase inhibitors: Secondary | ICD-10-CM | POA: Diagnosis not present

## 2017-01-22 DIAGNOSIS — Z800 Family history of malignant neoplasm of digestive organs: Secondary | ICD-10-CM | POA: Diagnosis not present

## 2017-01-22 DIAGNOSIS — E049 Nontoxic goiter, unspecified: Secondary | ICD-10-CM | POA: Diagnosis not present

## 2017-01-22 LAB — BASIC METABOLIC PANEL
ANION GAP: 9 (ref 5–15)
BUN: 11 mg/dL (ref 6–20)
CALCIUM: 9.4 mg/dL (ref 8.9–10.3)
CO2: 26 mmol/L (ref 22–32)
Chloride: 103 mmol/L (ref 101–111)
Creatinine, Ser: 0.86 mg/dL (ref 0.44–1.00)
Glucose, Bld: 166 mg/dL — ABNORMAL HIGH (ref 65–99)
POTASSIUM: 4.4 mmol/L (ref 3.5–5.1)
SODIUM: 138 mmol/L (ref 135–145)

## 2017-01-22 LAB — CBC
HEMATOCRIT: 38.7 % (ref 36.0–46.0)
HEMOGLOBIN: 11.8 g/dL — AB (ref 12.0–15.0)
MCH: 25.8 pg — ABNORMAL LOW (ref 26.0–34.0)
MCHC: 30.5 g/dL (ref 30.0–36.0)
MCV: 84.5 fL (ref 78.0–100.0)
Platelets: 308 10*3/uL (ref 150–400)
RBC: 4.58 MIL/uL (ref 3.87–5.11)
RDW: 15.8 % — ABNORMAL HIGH (ref 11.5–15.5)
WBC: 7 10*3/uL (ref 4.0–10.5)

## 2017-01-22 LAB — GLUCOSE, CAPILLARY: GLUCOSE-CAPILLARY: 155 mg/dL — AB (ref 65–99)

## 2017-01-22 MED ORDER — CHLORHEXIDINE GLUCONATE CLOTH 2 % EX PADS: 6.0000 | MEDICATED_PAD | Freq: Once | CUTANEOUS | Status: DC

## 2017-01-22 NOTE — Progress Notes (Addendum)
IPP:NDLOP, Bonnita Levan, MD  Cardiologist: Fransico Him, MD  EKG: 05/02/2016 in [facility]  Stress test: 09/2013 in [facility]  ECHO: 12/04/2016 in [facility]  Cardiac Cath: 10/2013 in [facility]  Chest [facility]-ray: 11/12/16 in [facility]

## 2017-01-23 ENCOUNTER — Encounter (HOSPITAL_COMMUNITY): Payer: Self-pay | Admitting: Anesthesiology

## 2017-01-23 MED ORDER — DEXTROSE 5 % IV SOLN
3.0000 g | INTRAVENOUS | Status: AC
  Administered 2017-01-24: 3 g via INTRAVENOUS
  Filled 2017-01-23: qty 3000

## 2017-01-23 NOTE — Anesthesia Preprocedure Evaluation (Addendum)
Anesthesia Evaluation  Patient identified by MRN, date of birth, ID band Patient awake    Reviewed: Allergy & Precautions, NPO status , Patient's Chart, lab work & pertinent test results, reviewed documented beta blocker date and time   History of Anesthesia Complications (+) PONV and history of anesthetic complications  Airway Mallampati: II       Dental  (+) Teeth Intact, Caps,    Pulmonary shortness of breath, with exertion, at rest and lying, asthma , sleep apnea ,    Pulmonary exam normal breath sounds clear to auscultation       Cardiovascular hypertension, Pt. on medications and Pt. on home beta blockers + CAD, + Peripheral Vascular Disease and +CHF  Normal cardiovascular exam+ Valvular Problems/Murmurs  Rhythm:Regular Rate:Normal     Neuro/Psych PSYCHIATRIC DISORDERS Anxiety negative neurological ROS     GI/Hepatic GERD  Medicated and Controlled,  Endo/Other  diabetes, Well Controlled, Type 2Hyperlipidemia Left Breast Ca   Renal/GU Renal disease  negative genitourinary   Musculoskeletal  (+) Arthritis , Osteoarthritis,    Abdominal (+) + obese,   Peds  Hematology  (+) anemia ,   Anesthesia Other Findings   Reproductive/Obstetrics                             Lab Results  Component Value Date   WBC 7.0 01/22/2017   HGB 11.8 (L) 01/22/2017   HCT 38.7 01/22/2017   MCV 84.5 01/22/2017   PLT 308 01/22/2017     Chemistry      Component Value Date/Time   NA 138 01/22/2017 0954   NA 139 01/09/2017 0904   K 4.4 01/22/2017 0954   K 3.5 01/09/2017 0904   CL 103 01/22/2017 0954   CO2 26 01/22/2017 0954   CO2 22 01/09/2017 0904   BUN 11 01/22/2017 0954   BUN 16.0 01/09/2017 0904   CREATININE 0.86 01/22/2017 0954   CREATININE 0.9 01/09/2017 0904      Component Value Date/Time   CALCIUM 9.4 01/22/2017 0954   CALCIUM 9.6 01/09/2017 0904   ALKPHOS 97 01/09/2017 0904   AST 40  (H) 01/09/2017 0904   ALT 36 01/09/2017 0904   BILITOT 0.40 01/09/2017 0904     EKG: sinus bradycardia, incomplete RBBB, frequent PVC's noted, septal infarct .  Anesthesia Physical  Anesthesia Plan  ASA: III  Anesthesia Plan: MAC   Post-op Pain Management:    Induction: Intravenous  PONV Risk Score and Plan: 3 and Ondansetron, Dexamethasone, Midazolam and Propofol infusion  Airway Management Planned: Natural Airway, Simple Face Mask and Nasal Cannula  Additional Equipment:   Intra-op Plan:   Post-operative Plan: Extubation in OR  Informed Consent: I have reviewed the patients History and Physical, chart, labs and discussed the procedure including the risks, benefits and alternatives for the proposed anesthesia with the patient or authorized representative who has indicated his/her understanding and acceptance.   Dental advisory given  Plan Discussed with: CRNA, Anesthesiologist and Surgeon  Anesthesia Plan Comments:       Anesthesia Quick Evaluation

## 2017-01-23 NOTE — H&P (Signed)
[name] Location: [facility] Patient #: [id number] DOB: [birth year] Divorced / Language: Cleophus Molt / Race: White Female      History of Present Illness  The patient is a 66 year old female who presents with breast cancer. This is a 66 year old female who returns with her daughter for her first postop visit following definitive surgical management of her left breast cancer. Dr. Lindi Adie and Dr. Sondra Come have evaluated her at the [facility]. Dr. Fransico Him is her cardiologist. Dr. Gwyneth Revels is her PCP.  On June 21, 2016 she underwent left lumpectomy and sentinel node biopsy. Final pathology shows a 1.1 cm invasive ductal carcinoma, high-grade, negative margins, positive LVI. Metastatic cancer in 1 out of 6 lymph nodes but there was positive extracapsular extension. ER 30%. PR negative. HER-2 positive. Stage TIc, N1. Dr. Lindi Adie has advised chemotherapy and she agrees with this Port-A-Cath was inserted on January 23. She has tolerated her chemotherapy.  All of her wounds have healed.  She is referred back for removal of her Port-A-Cath.  This has been discussed. She is aware of the techniques and risks. We are going to do this in the operating room under sedation because of her large body habitus.    No Known Drug Allergies  Allergies Reconciled   Medication History  Allegra (30MG Tablet, Oral) Active. Protonix (40MG Tablet DR, Oral) Active. Lexapro (10MG Tablet, Oral) Active. Voltaren (75MG Tablet DR, Oral) Active. Cardizem CD (120MG Capsule ER 24HR, Oral) Active. Lasix (20MG Tablet, Oral) Active. Cozaar (100MG Tablet, Oral) Active. Singulair (10MG Tablet, Oral) Active. Bystolic (25OI Tablet, Oral) Active. Crestor (20MG Tablet, Oral) Active. Albuterol (90MCG/ACT Aerosol Soln, Inhalation) Active. Qvar (40MCG/ACT Aerosol Soln, Inhalation) Active. Medications Reconciled  Vitals  Weight: 328.6 lb Height: 65in Body Surface Area:  2.44 m Body Mass Index: 54.68 kg/m  Temp.: 98.2F  Pulse: 72 (Regular)  BP: 142/78 (Sitting, Left Arm, Standard)       Physical Exam  General Note: Very pleasant. No distress. Daughter is with her. Mental status normal.  Head and Neck Note: Neck is soft. Thick. Thyroid slightly enlarged. No adenopathy. Reasonable range of motion. Short neck.  Chest and Lung Exam Note: Clear to auscultation bilaterally. No chest wall tenderness.port palpable right parasternal area Cardiovascular: Regular rate and rhythm.  No murmur or ectopy.  Radial pulses palpable.  Breast Note: Breasts are large. Lumpectomy incision laterally healing nicely. Axillary incision healing nicely. Tissues are soft. No sign of infection, seroma, or palpable hematoma. Range of motion left shoulder good.  Musculoskeletal Note: Chronic edema all 4 extremities.     Assessment & Plan  PRIMARY CANCER OF UPPER OUTER QUADRANT OF LEFT FEMALE BREAST (C50.412)   You have completed your chemotherapy.  You have been referred back to me by Dr. Lindi Adie for removal of your Port-A-Cath.  Your scheduled for that surgery..  Advised patient to stop ASA, anticoagulant, blood thinners, and NSAIDs Three (3) days prior to surgery.  HYPERTENSIVE LEFT VENTRICULAR HYPERTROPHY, WITH HEART FAILURE (I11.0) HYPERTENSION, BENIGN (I10) SLEEP APNEA IN ADULT (G47.30) ASTHMA IN ADULT (J45.909) OSTEOARTHRITIS OF RIGHT KNEE, UNSPECIFIED OSTEOARTHRITIS TYPE (M17.11) GOITER, SIMPLE (E04.0) FAMILY HISTORY OF COLON CANCER (Z80.0) HISTORY OF UMBILICAL HERNIA REPAIR (Z98.890) BMI 38.0-38.9,ADULT (Z68.38)    Edsel Petrin. Dalbert Batman, M.D., [facility] General and Minimally invasive Surgery Breast and Colorectal Surgery Office:   [phone] Pager:   [phone]

## 2017-01-23 NOTE — Progress Notes (Signed)
Anesthesia chart review: Patient is a 66 year old female scheduled for removal of Port-a-cath on 01/24/17 by Dr. Dalbert Batman. Procedure is posted for MAC.  History includes left breast cancer s/p lumpectomy 06/21/16 (s/p chemotherapy: Taxol X 12; Herceptin and Perjeta every 3 weeks, last dose 01/09/17; s/p radiation 10/31/16-11/28/16), s/p Port-a-cath 07/16/16, never smoker, post-operative N/V, GERD, chronic dialstolic CHF, infant murmur, exertional dyspnea, atrial tachycardia (suppressed on CCB and BB), PVCs (asymptomatic; on BB and CCB), mild CAD (20% mid LAD, 20% proximal CX, 20% ostial RCA '15), dilated ascending thoracic aorta (41 mm 09/2016 echo), HTN, HLD, anxiety, OSA (CPAP), morbid obesity, elevated LFTs '13, goiter, ventral hernia, cholecystectomy, left TKA '11, right TKA '15.   - PCP is Dr. Penni Homans, last visit 12/09/16. - Cardiologist is Dr. Fransico Him, last visit 05/02/16. - Hem-Onc is Dr. Lindi Adie, last visit 01/09/17. - Rad-Onc is Dr. Sondra Come, last 01/20/17.  Meds include albuterol, Arimidex, Azelastine/Fluticasone, aspirin 81 mg, Qvar, Bystolic, Cartia XT, Lexapro, Nexium, Flovent HFA, Lasix, losartan, Ativan, metformin, Zofran.   BP (!) 160/81   Pulse 77   Temp 36.8 C   Resp 18   Ht 5' 6"  (1.676 m)   Wt (!) 334 lb 14.4 oz (151.9 kg)   SpO2 96%   BMI 54.05 kg/m   EKG 05/02/16 ([facility]): SB at 56 bpm, frequent PVC in a bigeminy pattern, LAFB, septal infarct (age undetermined).  Echo 12/04/16: Study Conclusions - Left ventricle: The cavity size was normal. There was moderate   concentric hypertrophy. Systolic function was normal. The   estimated ejection fraction was in the range of 50% to 55%.   Doppler parameters are consistent with abnormal left ventricular   relaxation (grade 1 diastolic dysfunction). Doppler parameters   are consistent with high ventricular filling pressure. - Mitral valve: Moderately calcified annulus. - Left atrium: The atrium was mildly  dilated. Impressions: - When compared to prior, EF appears mildly reduced, although image   quality is challenging. (Comparison EF 60-65% 09/24/16. Since patient on chemotherapy with SOB and had a drop in her EF, Dr. [name] recommended a cardiac MRI which was initially scheduled for 12/27/16 at [facility]; however, MRI canceled due to body size is now being moved to [place] on 01/29/17.)  24 Hour Holter monitor 05/24/16:  Normal Sinus Rhythm with average heart rate 61bpm. The heart rate ranged from 45 to 99bpm.  Frequent PACs, atrial couplets and nonsustained atrial tachycardia up to 8 beats.  Occasional PVCs and ventricular couplets.  Cardiac cath 11/08/13: Left main: No obstructive disease.  Left Anterior Descending Artery: Large caliber vessel that courses to the apex. 20% mid stenosis. Moderate caliber first diagonal branch with ostial 20% stenosis.  Circumflex Artery: Large caliber vessel with two moderate caliber OM branches. 20% proximal stenosis.  Right Coronary Artery: Large dominant vessel with 20% ostial stenosis.  Left Ventricular Angiogram: Deferred.  Impression: 1. Mild non-obstructive CAD 2. Elevated filling pressures.  3. Dyspnea Recommendations: Medical management of mild CAD with statin. Her dyspnea is likely multi-factorial but she may have some improvement in symptoms with mild diuresis. Will discuss with Dr. [name].   Preoperative labs noted. Cr 0.86. Glucose 166. H/H 11.8/38.7, PLT 308K.  Last echo (done for surveillance while on Herceptin) showed a mild drop in her EF (now 50-55% with challenging images). Cardiac MRI is pending next week. Can hopefully do procedure with MAC. If unable to lie relatively flat would have to consider general anesthesia. Reviewed above with anesthesiologist Dr. Linna Caprice. Anesthesiologist to  evaluate on the day of surgery.   George Hugh [facility]/Anesthesiology Phone [phone] 01/23/2017 3:37 PM

## 2017-01-24 ENCOUNTER — Ambulatory Visit (HOSPITAL_COMMUNITY)
Admission: RE | Admit: 2017-01-24 | Discharge: 2017-01-24 | Disposition: A | Discharge status: Home / Self Care | Payer: Medicare HMO | Source: Ambulatory Visit | Attending: General Surgery | Admitting: General Surgery

## 2017-01-24 ENCOUNTER — Ambulatory Visit (HOSPITAL_COMMUNITY): Payer: Medicare HMO | Admitting: Emergency Medicine

## 2017-01-24 ENCOUNTER — Encounter (HOSPITAL_COMMUNITY)
Admission: RE | Disposition: A | Discharge status: Home / Self Care | Payer: Self-pay | Source: Ambulatory Visit | Attending: General Surgery

## 2017-01-24 ENCOUNTER — Ambulatory Visit (HOSPITAL_COMMUNITY): Payer: Medicare HMO | Admitting: Certified Registered"

## 2017-01-24 DIAGNOSIS — Z79899 Other long term (current) drug therapy: Secondary | ICD-10-CM | POA: Insufficient documentation

## 2017-01-24 DIAGNOSIS — I110 Hypertensive heart disease with heart failure: Secondary | ICD-10-CM | POA: Diagnosis not present

## 2017-01-24 DIAGNOSIS — E1151 Type 2 diabetes mellitus with diabetic peripheral angiopathy without gangrene: Secondary | ICD-10-CM | POA: Insufficient documentation

## 2017-01-24 DIAGNOSIS — K219 Gastro-esophageal reflux disease without esophagitis: Secondary | ICD-10-CM | POA: Insufficient documentation

## 2017-01-24 DIAGNOSIS — Z7951 Long term (current) use of inhaled steroids: Secondary | ICD-10-CM | POA: Insufficient documentation

## 2017-01-24 DIAGNOSIS — I509 Heart failure, unspecified: Secondary | ICD-10-CM | POA: Insufficient documentation

## 2017-01-24 DIAGNOSIS — I2510 Atherosclerotic heart disease of native coronary artery without angina pectoris: Secondary | ICD-10-CM | POA: Diagnosis not present

## 2017-01-24 DIAGNOSIS — Z923 Personal history of irradiation: Secondary | ICD-10-CM | POA: Insufficient documentation

## 2017-01-24 DIAGNOSIS — Z7984 Long term (current) use of oral hypoglycemic drugs: Secondary | ICD-10-CM | POA: Insufficient documentation

## 2017-01-24 DIAGNOSIS — Z791 Long term (current) use of non-steroidal anti-inflammatories (NSAID): Secondary | ICD-10-CM | POA: Diagnosis not present

## 2017-01-24 DIAGNOSIS — C773 Secondary and unspecified malignant neoplasm of axilla and upper limb lymph nodes: Secondary | ICD-10-CM | POA: Insufficient documentation

## 2017-01-24 DIAGNOSIS — Z79811 Long term (current) use of aromatase inhibitors: Secondary | ICD-10-CM | POA: Insufficient documentation

## 2017-01-24 DIAGNOSIS — M1711 Unilateral primary osteoarthritis, right knee: Secondary | ICD-10-CM | POA: Insufficient documentation

## 2017-01-24 DIAGNOSIS — C50412 Malignant neoplasm of upper-outer quadrant of left female breast: Secondary | ICD-10-CM | POA: Insufficient documentation

## 2017-01-24 DIAGNOSIS — Z452 Encounter for adjustment and management of vascular access device: Secondary | ICD-10-CM | POA: Diagnosis not present

## 2017-01-24 DIAGNOSIS — Z7982 Long term (current) use of aspirin: Secondary | ICD-10-CM | POA: Insufficient documentation

## 2017-01-24 DIAGNOSIS — Z170 Estrogen receptor positive status [ER+]: Secondary | ICD-10-CM | POA: Insufficient documentation

## 2017-01-24 DIAGNOSIS — I10 Essential (primary) hypertension: Secondary | ICD-10-CM | POA: Diagnosis not present

## 2017-01-24 DIAGNOSIS — C50919 Malignant neoplasm of unspecified site of unspecified female breast: Secondary | ICD-10-CM | POA: Diagnosis not present

## 2017-01-24 DIAGNOSIS — G4730 Sleep apnea, unspecified: Secondary | ICD-10-CM | POA: Insufficient documentation

## 2017-01-24 DIAGNOSIS — Z800 Family history of malignant neoplasm of digestive organs: Secondary | ICD-10-CM | POA: Insufficient documentation

## 2017-01-24 DIAGNOSIS — E049 Nontoxic goiter, unspecified: Secondary | ICD-10-CM | POA: Insufficient documentation

## 2017-01-24 DIAGNOSIS — Z9221 Personal history of antineoplastic chemotherapy: Secondary | ICD-10-CM | POA: Insufficient documentation

## 2017-01-24 DIAGNOSIS — J45909 Unspecified asthma, uncomplicated: Secondary | ICD-10-CM | POA: Insufficient documentation

## 2017-01-24 HISTORY — PX: PORT-A-CATH REMOVAL: SHX5289

## 2017-01-24 LAB — GLUCOSE, CAPILLARY
GLUCOSE-CAPILLARY: 151 mg/dL — AB (ref 65–99)
GLUCOSE-CAPILLARY: 128 mg/dL — AB (ref 65–99)

## 2017-01-24 SURGERY — REMOVAL PORT-A-CATH
Anesthesia: Monitor Anesthesia Care | Site: Chest

## 2017-01-24 MED ORDER — MIDAZOLAM HCL 5 MG/5ML IJ SOLN
INTRAMUSCULAR | Status: DC | PRN
  Administered 2017-01-24 (×2): 2 mg via INTRAVENOUS

## 2017-01-24 MED ORDER — FENTANYL CITRATE (PF) 100 MCG/2ML IJ SOLN: 25.0000 ug | INTRAMUSCULAR | Status: DC | PRN

## 2017-01-24 MED ORDER — MIDAZOLAM HCL 2 MG/2ML IJ SOLN
INTRAMUSCULAR | Status: AC
  Filled 2017-01-24: qty 2

## 2017-01-24 MED ORDER — ONDANSETRON HCL 4 MG/2ML IJ SOLN
INTRAMUSCULAR | Status: DC | PRN
  Administered 2017-01-24: 4 mg via INTRAVENOUS

## 2017-01-24 MED ORDER — LIDOCAINE-EPINEPHRINE (PF) 1 %-1:200000 IJ SOLN
INTRAMUSCULAR | Status: AC
  Filled 2017-01-24: qty 30

## 2017-01-24 MED ORDER — ONDANSETRON HCL 4 MG/2ML IJ SOLN
INTRAMUSCULAR | Status: AC
  Filled 2017-01-24: qty 2

## 2017-01-24 MED ORDER — PROPOFOL 500 MG/50ML IV EMUL
INTRAVENOUS | Status: DC | PRN
  Administered 2017-01-24: 100 ug/kg/min via INTRAVENOUS

## 2017-01-24 MED ORDER — METOCLOPRAMIDE HCL 5 MG/ML IJ SOLN: 10.0000 mg | Freq: Once | INTRAMUSCULAR | Status: DC | PRN

## 2017-01-24 MED ORDER — LACTATED RINGERS IV SOLN
INTRAVENOUS | Status: DC | PRN
  Administered 2017-01-24: 07:00:00 via INTRAVENOUS

## 2017-01-24 MED ORDER — ACETAMINOPHEN 500 MG PO TABS
1000.0000 mg | ORAL_TABLET | ORAL | Status: AC
  Administered 2017-01-24: 1000 mg via ORAL
  Filled 2017-01-24: qty 2

## 2017-01-24 MED ORDER — FENTANYL CITRATE (PF) 250 MCG/5ML IJ SOLN
INTRAMUSCULAR | Status: AC
  Filled 2017-01-24: qty 5

## 2017-01-24 MED ORDER — 0.9 % SODIUM CHLORIDE (POUR BTL) OPTIME
TOPICAL | Status: DC | PRN
  Administered 2017-01-24: 1000 mL

## 2017-01-24 MED ORDER — LIDOCAINE-EPINEPHRINE (PF) 1 %-1:200000 IJ SOLN
INTRAMUSCULAR | Status: DC | PRN
  Administered 2017-01-24: 16 mL

## 2017-01-24 MED ORDER — GABAPENTIN 300 MG PO CAPS
300.0000 mg | ORAL_CAPSULE | ORAL | Status: AC
  Administered 2017-01-24: 300 mg via ORAL
  Filled 2017-01-24: qty 1

## 2017-01-24 MED ORDER — DEXMEDETOMIDINE HCL IN NACL 200 MCG/50ML IV SOLN
INTRAVENOUS | Status: DC | PRN
  Administered 2017-01-24 (×2): 8 ug via INTRAVENOUS

## 2017-01-24 MED ORDER — PROPOFOL 10 MG/ML IV BOLUS
INTRAVENOUS | Status: AC
  Filled 2017-01-24: qty 20

## 2017-01-24 MED ORDER — CELECOXIB 200 MG PO CAPS
400.0000 mg | ORAL_CAPSULE | ORAL | Status: AC
  Administered 2017-01-24: 400 mg via ORAL
  Filled 2017-01-24: qty 2

## 2017-01-24 MED ORDER — HYDROCODONE-ACETAMINOPHEN 7.5-325 MG PO TABS: 1.0000 | ORAL_TABLET | Freq: Once | ORAL | Status: DC | PRN

## 2017-01-24 MED ORDER — DEXMEDETOMIDINE HCL IN NACL 200 MCG/50ML IV SOLN
INTRAVENOUS | Status: AC
  Filled 2017-01-24: qty 50

## 2017-01-24 SURGICAL SUPPLY — 25 items
CHLORAPREP W/TINT 10.5 ML (MISCELLANEOUS) ×2 IMPLANT
COVER SURGICAL LIGHT HANDLE (MISCELLANEOUS) ×2 IMPLANT
DERMABOND ADVANCED (GAUZE/BANDAGES/DRESSINGS) ×1
DERMABOND ADVANCED .7 DNX12 (GAUZE/BANDAGES/DRESSINGS) ×1 IMPLANT
DRAPE LAPAROTOMY 100X72 PEDS (DRAPES) ×2 IMPLANT
DRAPE UTILITY XL STRL (DRAPES) ×4 IMPLANT
ELECT REM PT RETURN 9FT ADLT (ELECTROSURGICAL) ×2
ELECTRODE REM PT RTRN 9FT ADLT (ELECTROSURGICAL) ×1 IMPLANT
GAUZE SPONGE 4X4 16PLY XRAY LF (GAUZE/BANDAGES/DRESSINGS) ×2 IMPLANT
GLOVE EUDERMIC 7 POWDERFREE (GLOVE) ×2 IMPLANT
GOWN STRL REUS W/ TWL LRG LVL3 (GOWN DISPOSABLE) ×1 IMPLANT
GOWN STRL REUS W/ TWL XL LVL3 (GOWN DISPOSABLE) ×1 IMPLANT
GOWN STRL REUS W/TWL LRG LVL3 (GOWN DISPOSABLE) ×1
GOWN STRL REUS W/TWL XL LVL3 (GOWN DISPOSABLE) ×1
KIT BASIN OR (CUSTOM PROCEDURE TRAY) ×2 IMPLANT
KIT ROOM TURNOVER OR (KITS) ×2 IMPLANT
NEEDLE HYPO 25GX1X1/2 BEV (NEEDLE) ×2 IMPLANT
NS IRRIG 1000ML POUR BTL (IV SOLUTION) ×2 IMPLANT
PACK SURGICAL SETUP 50X90 (CUSTOM PROCEDURE TRAY) ×2 IMPLANT
PAD ARMBOARD 7.5X6 YLW CONV (MISCELLANEOUS) ×2 IMPLANT
PENCIL BUTTON HOLSTER BLD 10FT (ELECTRODE) ×2 IMPLANT
SUT MNCRL AB 4-0 PS2 18 (SUTURE) ×2 IMPLANT
SYR CONTROL 10ML LL (SYRINGE) ×2 IMPLANT
TOWEL OR 17X24 6PK STRL BLUE (TOWEL DISPOSABLE) ×2 IMPLANT
TOWEL OR 17X26 10 PK STRL BLUE (TOWEL DISPOSABLE) ×2 IMPLANT

## 2017-01-24 NOTE — Transfer of Care (Signed)
Immediate Anesthesia Transfer of Care Note  Patient: [name]  Procedure(s) Performed: Procedure(s): REMOVAL PORT-A-CATH (N/A)  Patient Location: PACU  Anesthesia Type:MAC  Level of Consciousness: drowsy and patient cooperative  Airway & Oxygen Therapy: Patient Spontanous Breathing and Patient connected to face mask oxygen  Post-op Assessment: Report given to RN and Post -op Vital signs reviewed and stable  Post vital signs: Reviewed and stable  Last Vitals:  Vitals:   01/24/17 0621 01/24/17 0810  BP: (!) 172/95   Pulse: 72 65  Resp: 18 18  Temp: 36.9 C 36.7 C    Last Pain: There were no vitals filed for this visit.       Complications: No apparent anesthesia complications

## 2017-01-24 NOTE — Discharge Instructions (Signed)
You may shower starting tomorrow No tub baths  Take Tylenol 1000 mg every 6 hours for 4 doses, then as needed  Ice pack for 10 minutes at a time, intermittently, for 24 hours.  This will reduce bruising and pain  You may drive your car in a few days if your comfortable  Keep your appointment with Dr. Lindi Adie  See Dr. Dalbert Batman in 5-6 months, after you get your bilateral mammograms. Be sure to call Dr. Dalbert Batman in the office and set this appointment up

## 2017-01-24 NOTE — Op Note (Signed)
Patient Name:           [name]   Date of Surgery:        01/24/2017  Pre op Diagnosis:      Invasive cancer left breast, stage TI cN1.                                       Status post adjuvant chemotherapy and radiation therapy  Post op Diagnosis:    Same  Procedure:                 Removal of Port-A-Cath  Surgeon:                     Edsel Petrin. Dalbert Batman, M.D., FACS  Assistant:                      OR staff   Indication for Assistant: N/A  Operative Indications:  This is a 66 year old female who is brought to the operating room electively for removal of her Port-A-Cath.. Dr. Lindi Adie and Dr. Sondra Come have evaluated her at the [facility]. Dr. Fransico Him is her cardiologist. Dr. Gwyneth Revels is her PCP.     On June 21, 2016 she underwent left lumpectomy and sentinel node biopsy. Final pathology shows a 1.1 cm invasive ductal carcinoma, high-grade, negative margins, positive LVI. Metastatic cancer in 1 out of 6 lymph nodes but there was positive extracapsular extension. ER 30%. PR negative. HER-2 positive. Stage TIc, N1. Dr. Lindi Adie has advised chemotherapy.  Port-A-Cath was inserted on January 23. She has tolerated her chemotherapy.  All of her wounds have healed. She has completed chemotherapy and whole breast radiation therapy She is referred back for removal of her Port-A-Cath.  This has been discussed. She is aware of the techniques and risks.   Operative Findings:       The port and catheter were removed intact.  No signs of infection.  No significant bleeding.  Procedure in Detail:          The patient was brought to the operating room and placed supine on the operating table.  She was monitored and sedated by the anesthesia department.  Surgical timeout was performed.  Intravenous antibiotics were given.  The right chest and right neck were prepped and draped in a sterile fashion.  1% Xylocaine with epinephrine was used as local infiltration anesthetic.   Transverse incision was made in the right parasternal area through the all scar.  Dissection was carried down to the capsule and the port.  The port was elevated.  All 3 Prolene sutures were cut and removed.  The port and catheter were then removed intact without difficulty.  There was no bleeding.  Subcutaneous tissues were closed with 3-0 Vicryl sutures and the skin closed with a running subcuticular 4-0 Monocryl and Dermabond.  The patient tolerated the procedure well was taken to PACU in stable condition.  EBL less than 10 mL.  Counts correct.  Complications none.     Edsel Petrin. Dalbert Batman, M.D., FACS General and Minimally Invasive Surgery Breast and Colorectal Surgery  01/24/2017 8:14 AM

## 2017-01-24 NOTE — Anesthesia Postprocedure Evaluation (Signed)
Anesthesia Post Note  Patient: [name]  Procedure(s) Performed: Procedure(s) (LRB): REMOVAL PORT-A-CATH (N/A)     Patient location during evaluation: PACU Anesthesia Type: MAC Level of consciousness: awake and alert and oriented Pain management: pain level controlled Vital Signs Assessment: post-procedure vital signs reviewed and stable Respiratory status: spontaneous breathing, nonlabored ventilation and respiratory function stable Cardiovascular status: stable and blood pressure returned to baseline Postop Assessment: no signs of nausea or vomiting Anesthetic complications: no    Last Vitals:  Vitals:   01/24/17 0825 01/24/17 0830  BP: (!) 98/46   Pulse: (!) 58 65  Resp: (!) 21 16  Temp:      Last Pain:  Vitals:   01/24/17 0810  PainSc: 0-No pain                 Dreya Buhrman A.

## 2017-01-25 ENCOUNTER — Encounter (HOSPITAL_COMMUNITY): Payer: Self-pay | Admitting: General Surgery

## 2017-01-28 ENCOUNTER — Telehealth: Payer: Self-pay | Admitting: Cardiology

## 2017-01-28 NOTE — Telephone Encounter (Signed)
[name] is calling because she have a MRI appt on tomorrow ,but she cancel the appt . She is wanting to let you know that she has finished her Cancer treatments and is no longer taking her antibiotics. Please Call

## 2017-01-28 NOTE — Telephone Encounter (Signed)
Patient calling and states that she is no longer getting cancer treatments. She states that her last treatment was almost 2 weeks ago. She states that she was called today about having a cardiac MRI. Patient states that she did not know that the cardiac MRI was scheduled for today so she cancelled the appointment. Patient wants to know if she really needs to have it done.

## 2017-01-28 NOTE — Telephone Encounter (Signed)
Yes she needs this to assess EF due to Chemo

## 2017-01-28 NOTE — Telephone Encounter (Signed)
Called and made patient aware that Dr. [name] still wants her to have the cardiac MRI to assess her EF due to her chemo. Patient verbalized understanding. Patient asking if she can have it done somewhere else other than [facility]. Made patient aware that information would be forwarded to Dr. Theodosia Blender RN.

## 2017-01-29 NOTE — Telephone Encounter (Signed)
Left message to call back  

## 2017-01-30 ENCOUNTER — Ambulatory Visit: Payer: Medicare HMO

## 2017-01-30 ENCOUNTER — Encounter: Payer: Self-pay | Admitting: Cardiology

## 2017-01-30 ENCOUNTER — Other Ambulatory Visit: Payer: Medicare HMO

## 2017-01-30 NOTE — Telephone Encounter (Signed)
Reiterated to patient that [facility]'s machine is slightly larger (per their Radiology department) and her best chance of completing scan is at [facility]. She agrees to reschedule MRI and requests [facility] call to arrange.   To [facility].

## 2017-01-30 NOTE — Telephone Encounter (Signed)
Follow up     Pt returning call, from 01/29/17 to schedule MRI

## 2017-01-31 ENCOUNTER — Telehealth: Payer: Self-pay | Admitting: Cardiology

## 2017-01-31 NOTE — Telephone Encounter (Signed)
Patient has been schedule for the Cardiac MRI @ the  [facility] on 02-20-17 @ 1:30.  Per Lanae Boast they will send the patient the direction to the appointment.  Have sent info to precert/their fx # is [phone].   Also call and left voice message to patient to call regarding the appointment.

## 2017-02-06 ENCOUNTER — Ambulatory Visit (INDEPENDENT_AMBULATORY_CARE_PROVIDER_SITE_OTHER): Payer: Medicare HMO | Admitting: Internal Medicine

## 2017-02-06 ENCOUNTER — Encounter: Payer: Self-pay | Admitting: Internal Medicine

## 2017-02-06 VITALS — BP 138/76 | HR 72 | Ht 65.5 in | Wt 331.2 lb

## 2017-02-06 DIAGNOSIS — G4733 Obstructive sleep apnea (adult) (pediatric): Secondary | ICD-10-CM

## 2017-02-06 DIAGNOSIS — R221 Localized swelling, mass and lump, neck: Secondary | ICD-10-CM

## 2017-02-06 DIAGNOSIS — J300 Vasomotor rhinitis: Secondary | ICD-10-CM | POA: Diagnosis not present

## 2017-02-06 DIAGNOSIS — J45901 Unspecified asthma with (acute) exacerbation: Secondary | ICD-10-CM

## 2017-02-06 DIAGNOSIS — R0609 Other forms of dyspnea: Secondary | ICD-10-CM | POA: Diagnosis not present

## 2017-02-06 MED ORDER — FLUTICASONE FUROATE-VILANTEROL 100-25 MCG/INH IN AEPB: 1.0000 | INHALATION_SPRAY | Freq: Every day | RESPIRATORY_TRACT | 0 refills | Status: DC

## 2017-02-06 NOTE — Patient Instructions (Signed)
Order- [facility]- please check humidifier- plastic may be getting too hot. Continue CPAP auto 5-15, mask of choice, humidifier, supplies, AirView   Dx OSA  Order- office spirometry    Dx asthmatic bronchitis  Acute  Sample Dymista nasal spray     1-2 puffs each nostril once daily at bedtime  Sample Breo 100    Inhale 1 puff, once daily, then rinse mouth   Try this instead of Flovent/ Qvar                      See if it helps your breathing.  Please call as needed

## 2017-02-06 NOTE — Progress Notes (Signed)
02/06/17-66 year old female never smoker followed for OSA.Marland Kitchen Over 3 years since last seen for this problem. Coming to reestablish. Medical problem list includes sinusitis, anxiety and depression, paroxysmal atrial tachycardia, CAD, Breast cancer L Unattended Home Sleep Test 11/05/13-AHI 68.1/hour, desaturation to 62%, body weight 320 pounds CPAP auto 5-15/ [facility] Former [facility] patient for sleep; HST in 2015 and wears CPAP nightly and naptime during the day;DME is [facility]. Pt is concerned about breathing issues-wheezing and SOB-wants to make sure its not the CPAP causing the breathing issues.  Likes her CPAP with nasal pillows mask and breathes comfortably with it at night. CPAP download 100% compliance averaging 12 hours and 58 minutes per day, AHI 0.5/hour. She notices a part of the humidifier reservoir changes color with use, suggesting possible overheating. She describes intermittent wheezing, sinus congestion with drainage and frontal headache. Thinks she was breathing better using Qvar, not covered by insurance, not as good with Flovent. Denies purulent sputum, blood, chest pain, adenopathy. Sometimes feels strangled on her own spit but denies problems swallowing. Just finished XRT. Weight is up 20 pounds over the last 2 years. CXR 11/12/16 No active cardiopulmonary disease. Echocardiogram 12/04/16- Nl EF (Notes recorded by Sueanne Margarita, MD on 12/04/2016 at 3:54 PM EDT: Echo showed low normal LVF with EF 50-55% with moderate LVH, mild LAE. Compared to prior echo EF appears mildly reduced and patient is getting Chemo with new SOB) Office Spirometry 02/06/17-moderate restriction of exhaled volume. FVC 2.02/61%, FEV1 1.75/69%, ratio 0.86, FEF 25-75% 2.45/113%  Prior to Admission medications   Medication Sig Start Date End Date Taking? Authorizing Provider  acetaminophen (TYLENOL) 500 MG tablet Take 1,000 mg by mouth 2 (two) times daily as needed for moderate pain or headache.   Yes [provider]  albuterol (PROVENTIL HFA;VENTOLIN HFA) 108 (90 Base) MCG/ACT inhaler Inhale 2 puffs into the lungs every 6 (six) hours as needed for wheezing or shortness of breath. 12/09/16  Yes [name], MD  anastrozole (ARIMIDEX) 1 MG tablet Take 1 tablet (1 mg total) by mouth daily. 01/09/17  Yes Nicholas Lose, MD  aspirin EC 81 MG tablet Take 81 mg by mouth at bedtime.    Yes [provider]  BYSTOLIC 10 MG tablet TAKE 1 TABLET AT BEDTIME 01/09/17  Yes [name], MD  CARTIA XT 120 MG 24 hr capsule TAKE 1 CAPSULE EVERY DAY 12/24/16  Yes [name], MD  clotrimazole-betamethasone (LOTRISONE) cream Apply 1 application topically 2 (two) times daily as needed (for rash). 07/11/14  Yes [name], MD  diclofenac (VOLTAREN) 75 MG EC tablet TAKE 1 TABLET TWICE DAILY 10/10/16  Yes [name], MD  escitalopram (LEXAPRO) 20 MG tablet Take 1 tablet (20 mg total) by mouth daily. 10/08/16  Yes [name], MD  esomeprazole (NEXIUM) 40 MG capsule Take 40 mg by mouth daily.    Yes [provider]  fluticasone (FLOVENT HFA) 110 MCG/ACT inhaler Inhale 1 puff into the lungs 2 (two) times daily. For Wheezing and COPD 12/12/16  Yes [name], MD  furosemide (LASIX) 20 MG tablet TAKE 1 TABLET EVERY DAY 12/24/16  Yes [name], MD  Hypromellose (ARTIFICIAL TEARS OP) Place 1 drop into both eyes daily as needed (dry eyes).    Yes [provider]  losartan (COZAAR) 100 MG tablet TAKE 1 TABLET EVERY DAY 12/24/16  Yes [name], MD  metFORMIN (GLUCOPHAGE) 500 MG tablet Take 1 tablet (500 mg total) by  mouth 2 (two) times daily with a meal. 12/09/16  Yes [name], MD  montelukast (SINGULAIR) 10 MG tablet TAKE 1 TABLET AT BEDTIME 12/24/16  Yes [name], MD  Probiotic Product (PROBIOTIC PO) Take 1 capsule by mouth at bedtime.   Yes [provider]  rosuvastatin (CRESTOR) 40 MG tablet Take 1 tablet (40 mg total) by mouth daily. 01/07/17 01/02/18 Yes [name], MD  fluticasone furoate-vilanterol (BREO ELLIPTA) 100-25 MCG/INH AEPB Inhale 1 puff into the lungs daily. 02/06/17   Deneise Lever, MD   Past Medical History:  Diagnosis Date  . Anemia 04/05/2012  . Anxiety   . Anxiety state 09/11/2007   Qualifier: Diagnosis of  By: Scherrie Gerlach    . Atrial tachycardia, paroxysmal ([place])   . Back pain 10/02/2014  . Chicken pox as a child  . Chronic diastolic CHF (congestive heart failure), NYHA class 1 ([place])   . Complication of anesthesia    pt states feels different in her body after anesthesia when waking up and also experiences a smell of burnt plastic for approx a wk   . Dilated aortic root ([place])    35mm by echo 09/2016  . Elevated LFTs 04/05/2012  . GERD (gastroesophageal reflux disease)   . Goiter   . Heart murmur    hx of one at birth   . History of kidney stones   . History of radiation therapy 10/31/16-12/17/16   left breast 45 Gy in 25 fractions, left breast boost 16 Gy in 8 fractions  . Hx of colonic polyps   . Hyperlipidemia   . Hypertension   . Insomnia 10/08/2016  . Malignant neoplasm of upper-outer quadrant of left female breast ([place]) 05/14/2016   not with patient  . Measles as a child  . Mumps as a child  . OA (osteoarthritis) of knee 04/05/2012  . Obesity 07/11/2014  . PONV (postoperative nausea and vomiting)   . Preventative health care 09/05/2013  . PVC's (premature ventricular contractions) 05/02/2016  . Shortness of breath dyspnea    walking distances / climbing stairs  . Sleep apnea 04/05/2012  . Tinea corporis 02/23/2013  . Type 2 diabetes mellitus with hyperglycemia ([place]) 12/31/2013  . Vasomotor rhinitis 04/05/2012  . Ventral hernia    Past Surgical History:  Procedure Laterality Date  . BREAST LUMPECTOMY WITH RADIOACTIVE SEED AND SENTINEL LYMPH NODE BIOPSY Left 06/21/2016   Procedure: LEFT BREAST LUMPECTOMY WITH RADIOACTIVE SEED AND SENTINEL LYMPH NODE BIOPSY, INJECT BLUE DYE LEFT BREAST;  Surgeon: Fanny Skates, MD;  Location: [facility];  Service: General;  Laterality: Left;  . CARDIAC CATHETERIZATION     normal coroary arteries per patient  . CARDIOVASCULAR STRESS TEST     10/12/2013  . CESAREAN SECTION     X 3  . CHOLECYSTECTOMY    . COLONOSCOPY WITH PROPOFOL N/A 03/28/2015   Procedure: COLONOSCOPY WITH PROPOFOL;  Surgeon: Juanita Craver, MD;  Location: [facility];  Service: Endoscopy;  Laterality: N/A;  . HERNIA REPAIR  02/08/11   ventral hernia  . JOINT REPLACEMENT     bilateral  . KNEE ARTHROSCOPY  05/2010   bilateral  . LEFT AND RIGHT HEART CATHETERIZATION WITH CORONARY ANGIOGRAM N/A 11/08/2013   Procedure: LEFT AND RIGHT HEART CATHETERIZATION WITH CORONARY ANGIOGRAM;  Surgeon: [name], MD;  Location: [facility];  Service: Cardiovascular;  Laterality: N/A;  . MENISCUS REPAIR  2009  . MOUTH SURGERY     teeth implants  .  PORT-A-CATH REMOVAL N/A 01/24/2017   Procedure: REMOVAL PORT-A-CATH;  Surgeon: Fanny Skates, MD;  Location: [facility];  Service: General;  Laterality: N/A;  . PORTACATH PLACEMENT Right 07/16/2016   Procedure: INSERTION PORT-A-CATH RIGHT INTERNAL JUGULAR WITH ULTRASOUND;  Surgeon: Fanny Skates, MD;  Location: [facility];  Service: General;  Laterality: Right;  . TOTAL KNEE ARTHROPLASTY  2011   left  . TOTAL KNEE ARTHROPLASTY Right 05/10/2014   Procedure: RIGHT TOTAL KNEE ARTHROPLASTY;  Surgeon: Mauri Pole, MD;  Location: [facility];  Service: Orthopedics;  Laterality: Right;  . TUBAL LIGATION    . WISDOM TOOTH EXTRACTION  2000   Family History  Problem Relation Age of Onset  . Thyroid disease Mother   . Hyperlipidemia Mother   . Heart attack Father 54  . Hypertension Father   . Arthritis Father        RA  . Coronary artery disease Father   . Coronary artery disease Brother   . Heart disease Brother   . Cancer Maternal Aunt        colon  . Cancer Maternal Grandmother        colon  . Heart attack Maternal Grandfather   . Alcohol abuse Paternal  Grandfather    Social History   Social History  . Marital status: Divorced    Spouse name: N/A  . Number of children: 3  . Years of education: N/A   Occupational History  . retired - [employer]    Social History Main Topics  . Smoking status: Never Smoker  . Smokeless tobacco: Never Used  . Alcohol use Yes     Comment: rarely  . Drug use: No  . Sexual activity: No     Comment: lives with mother currently-stress   Other Topics Concern  . Not on file   Social History Narrative  . No narrative on file   ROS-see HPI   + = Positive Constitutional:    weight loss, night sweats, fevers, chills, fatigue, lassitude. HEENT:   + headaches, difficulty swallowing, tooth/dental problems, sore throat,       sneezing, itching, ear ache, + nasal congestion, post nasal drip, snoring CV:    chest pain, orthopnea, PND, swelling in lower extremities, anasarca,                                                  dizziness, palpitations Resp:   + shortness of breath with exertion or at rest.                productive cough,   non-productive cough, coughing up of blood.              change in color of mucus.  + wheezing.   Skin:    rash or lesions. GI:  No-   heartburn, indigestion, abdominal pain, nausea, vomiting, diarrhea,                 change in bowel habits, loss of appetite GU: dysuria, change in color of urine, no urgency or frequency.   flank pain. MS:   joint pain, stiffness, decreased range of motion, back pain. Neuro-     nothing unusual Psych:  change in mood or affect.  depression or anxiety.   memory loss.  OBJ- Physical Exam General- Alert, Oriented, Affect-appropriate, Distress- none acute, +  morbidly obese Skin- rash-none, lesions- none, excoriation- none Lymphadenopathy- none Head- atraumatic, + wearing head scarf after chemotherapy            Eyes- Gross vision intact, PERRLA, conjunctivae and secretions clear            Ears- Hearing, canals-normal            Nose-  Clear, no-Septal dev, mucus, polyps, erosion, perforation             Throat- Mallampati II , mucosa clear , drainage- none, tonsils- atrophic Neck- flexible , trachea midline, ? stridor-versus resonant inspiratory sound , thyroid R mass identified as" goiter" , carotid no bruit Chest - symmetrical excursion , unlabored           Heart/CV- RRR , no murmur , no gallop  , no rub, nl s1 s2                           - JVD- none , edema- none, stasis changes- none, varices- none           Lung- + coarse breath sounds, somewhat labored and coarse when actively talking but quiet with unlabored breathing. wheeze- none, cough- none , dullness-none, rub- none           Chest wall-  Abd-  Br/ Gen/ Rectal- Not done, not indicated Extrem- cyanosis- none, clubbing, none, atrophy- none, strength- nl Neuro- grossly intact to observation

## 2017-02-09 NOTE — Assessment & Plan Note (Signed)
Right paratracheal mass she says has been identified as a goiter. This needs to be watched.

## 2017-02-09 NOTE — Assessment & Plan Note (Signed)
CPAP documents excellent compliance and control with long recorded sleep time of 12 hours and 58 minutes average. She has some concern about her humidifier-color change suggesting plastic overheating. She can take that issue a DME company. Her breathing concerns don't seem to be when she is wearing CPAP.  Plan- continue CPAP auto 5-15/[facility]

## 2017-02-09 NOTE — Assessment & Plan Note (Addendum)
Primarily dyspneic on exertion. Multiple contributory factors including her morbid obesity, consistent with obesity hypoventilation syndrome as basis for the restrictive pattern on office spirometry. She is deconditioned and further weakened by recent breast cancer therapy. Breath sounds are just a little stridorous when she is talking fast but nothing is seen on CXR. Keep in mind possibility of intraluminal breast cancer metastasis. She has anterior neck mass identified as of goiter. If concerns persist, we will get a CT scan. Also try sample Breo 100 to see if it has any effect.

## 2017-02-09 NOTE — Assessment & Plan Note (Signed)
Plan-sample die Mr. nasal spray to see if it helps sensation of postnasal drainage

## 2017-02-10 ENCOUNTER — Other Ambulatory Visit: Payer: Self-pay | Admitting: Family Medicine

## 2017-02-17 ENCOUNTER — Encounter: Payer: Self-pay | Admitting: Family Medicine

## 2017-02-17 ENCOUNTER — Other Ambulatory Visit: Payer: Self-pay | Admitting: Family Medicine

## 2017-02-17 ENCOUNTER — Ambulatory Visit (INDEPENDENT_AMBULATORY_CARE_PROVIDER_SITE_OTHER): Payer: Medicare HMO | Admitting: Family Medicine

## 2017-02-17 VITALS — BP 132/70 | HR 71 | Temp 98.3°F | Resp 14 | Ht 66.0 in | Wt 330.1 lb

## 2017-02-17 DIAGNOSIS — E6609 Other obesity due to excess calories: Secondary | ICD-10-CM | POA: Diagnosis not present

## 2017-02-17 DIAGNOSIS — J019 Acute sinusitis, unspecified: Secondary | ICD-10-CM

## 2017-02-17 DIAGNOSIS — R399 Unspecified symptoms and signs involving the genitourinary system: Secondary | ICD-10-CM

## 2017-02-17 DIAGNOSIS — E782 Mixed hyperlipidemia: Secondary | ICD-10-CM | POA: Diagnosis not present

## 2017-02-17 DIAGNOSIS — R300 Dysuria: Secondary | ICD-10-CM | POA: Diagnosis not present

## 2017-02-17 DIAGNOSIS — D508 Other iron deficiency anemias: Secondary | ICD-10-CM | POA: Diagnosis not present

## 2017-02-17 DIAGNOSIS — G4733 Obstructive sleep apnea (adult) (pediatric): Secondary | ICD-10-CM | POA: Diagnosis not present

## 2017-02-17 DIAGNOSIS — R0609 Other forms of dyspnea: Secondary | ICD-10-CM

## 2017-02-17 DIAGNOSIS — E7800 Pure hypercholesterolemia, unspecified: Secondary | ICD-10-CM

## 2017-02-17 DIAGNOSIS — I10 Essential (primary) hypertension: Secondary | ICD-10-CM

## 2017-02-17 DIAGNOSIS — C50919 Malignant neoplasm of unspecified site of unspecified female breast: Secondary | ICD-10-CM | POA: Diagnosis not present

## 2017-02-17 DIAGNOSIS — E1165 Type 2 diabetes mellitus with hyperglycemia: Secondary | ICD-10-CM

## 2017-02-17 DIAGNOSIS — I2510 Atherosclerotic heart disease of native coronary artery without angina pectoris: Secondary | ICD-10-CM

## 2017-02-17 HISTORY — DX: Dysuria: R30.0

## 2017-02-17 HISTORY — DX: Malignant neoplasm of unspecified site of unspecified female breast: C50.919

## 2017-02-17 LAB — URINALYSIS, ROUTINE W REFLEX MICROSCOPIC
Bilirubin Urine: NEGATIVE
HGB URINE DIPSTICK: NEGATIVE
KETONES UR: NEGATIVE
Nitrite: NEGATIVE
RBC / HPF: NONE SEEN (ref 0–?)
Specific Gravity, Urine: 1.015 (ref 1.000–1.030)
TOTAL PROTEIN, URINE-UPE24: NEGATIVE
URINE GLUCOSE: NEGATIVE
UROBILINOGEN UA: 0.2 (ref 0.0–1.0)
pH: 6 (ref 5.0–8.0)

## 2017-02-17 LAB — POC URINALSYSI DIPSTICK (AUTOMATED)
BILIRUBIN UA: NEGATIVE
Blood, UA: NEGATIVE
GLUCOSE UA: NEGATIVE
Ketones, UA: NEGATIVE
NITRITE UA: NEGATIVE
Protein, UA: NEGATIVE
Spec Grav, UA: 1.02 (ref 1.010–1.025)
Urobilinogen, UA: 0.2 E.U./dL
pH, UA: 6 (ref 5.0–8.0)

## 2017-02-17 LAB — LIPID PANEL
CHOLESTEROL: 147 mg/dL (ref 0–200)
HDL: 33.9 mg/dL — ABNORMAL LOW (ref >39.00)
LDL Cholesterol: 78 mg/dL (ref 0–99)
NONHDL: 112.82
Total CHOL/HDL Ratio: 4
Triglycerides: 172 mg/dL — ABNORMAL HIGH (ref 0.0–149.0)
VLDL: 34.4 mg/dL (ref 0.0–40.0)

## 2017-02-17 LAB — COMPREHENSIVE METABOLIC PANEL
ALK PHOS: 83 U/L (ref 39–117)
ALT: 42 U/L — AB (ref 0–35)
AST: 48 U/L — AB (ref 0–37)
Albumin: 4.1 g/dL (ref 3.5–5.2)
BILIRUBIN TOTAL: 0.5 mg/dL (ref 0.2–1.2)
BUN: 17 mg/dL (ref 6–23)
CO2: 31 meq/L (ref 19–32)
CREATININE: 0.81 mg/dL (ref 0.40–1.20)
Calcium: 10 mg/dL (ref 8.4–10.5)
Chloride: 102 mEq/L (ref 96–112)
GFR: 75.13 mL/min (ref >60.00)
GLUCOSE: 145 mg/dL — AB (ref 70–99)
Potassium: 4.9 mEq/L (ref 3.5–5.1)
Sodium: 140 mEq/L (ref 135–145)
TOTAL PROTEIN: 7.7 g/dL (ref 6.0–8.3)

## 2017-02-17 LAB — CBC
HCT: 39 % (ref 36.0–46.0)
HEMOGLOBIN: 12.2 g/dL (ref 12.0–15.0)
MCHC: 31.2 g/dL (ref 30.0–36.0)
MCV: 81.9 fl (ref 78.0–100.0)
PLATELETS: 302 10*3/uL (ref 150.0–400.0)
RBC: 4.76 Mil/uL (ref 3.87–5.11)
RDW: 18.2 % — AB (ref 11.5–15.5)
WBC: 7.9 10*3/uL (ref 4.0–10.5)

## 2017-02-17 LAB — TSH: TSH: 1.84 u[IU]/mL (ref 0.35–4.50)

## 2017-02-17 LAB — HEMOGLOBIN A1C: HEMOGLOBIN A1C: 7.3 % — AB (ref 4.6–6.5)

## 2017-02-17 MED ORDER — CEFDINIR 300 MG PO CAPS: 300.0000 mg | ORAL_CAPSULE | Freq: Two times a day (BID) | ORAL | 0 refills | Status: AC

## 2017-02-17 NOTE — Assessment & Plan Note (Signed)
Symptoms x 3 days to include dysuria, urinary frequency and urgency, ordered UA and culture. Due to symptoms will Cefdinir twice daily.

## 2017-02-17 NOTE — Progress Notes (Signed)
Pre visit review using our clinic review tool, if applicable. No additional management support is needed unless otherwise documented below in the visit note. 

## 2017-02-17 NOTE — Assessment & Plan Note (Signed)
Increase leafy greens, consider increased lean red meat and using cast iron cookware. Continue to monitor, report any concerns 

## 2017-02-17 NOTE — Assessment & Plan Note (Signed)
Saw pulmonology recently,

## 2017-02-17 NOTE — Assessment & Plan Note (Addendum)
hgba1c acceptable, minimize simple carbs. Increase exercise as tolerated. Continue current meds. Am sugars are wnl

## 2017-02-17 NOTE — Assessment & Plan Note (Signed)
Started on [place] and Mucinex

## 2017-02-17 NOTE — Assessment & Plan Note (Signed)
Tolerating statin, encouraged heart healthy diet, avoid trans fats, minimize simple carbs and saturated fats. Increase exercise as tolerated 

## 2017-02-17 NOTE — Progress Notes (Signed)
[name], female   DOB: 1951-05-03, 66 y.o.   MRN: [id number]   Subjective:    [name], female    DOB: 28-Jun-1950, 66 y.o.   MRN: [id number]  Chief Complaint  Patient presents with  . Follow-up    2 month  . Sinusitis    x1-2 weeks, facial pain, sore throat, denies fever, chills, n/v  . Urinary Tract Infection    over weekend    HPI Patient is in today for Follow-up. She has finished her chemotherapy and radiation. Her port has been removed and she does not have a follow-up with oncology for 6 months. Unfortunately she is complaining of some respiratory symptoms as well as some urinary symptoms. She endorses ear pressure and some itching in her ears itchy watery irritated eyes are also noted as well as facial pressure congestion and malaise. She also reports urinary frequency, urgency and dysuria over the last couple of days as well. Denies CP/palp/SOB/HA/congestion/fevers. Taking meds as prescribed  Past Medical History:  Diagnosis Date  . Anemia 04/05/2012  . Anxiety   . Anxiety state 09/11/2007   Qualifier: Diagnosis of  By: Scherrie Gerlach    . Atrial tachycardia, paroxysmal ([place])   . Back pain 10/02/2014  . Breast cancer ([name]) 02/17/2017  . Chicken pox as a child  . Chronic diastolic CHF (congestive heart failure), NYHA class 1 ([place])   . Complication of anesthesia    pt states feels different in her body after anesthesia when waking up and also experiences a smell of burnt plastic for approx a wk   . Dilated aortic root ([place])    96m by echo 09/2016  . Dysuria 02/17/2017  . Elevated LFTs 04/05/2012  . GERD (gastroesophageal reflux disease)   . Goiter   . Heart murmur    hx of one at birth   . History of kidney stones   . History of radiation therapy 10/31/16-12/17/16   left breast 45 Gy in 25 fractions, left breast boost 16 Gy in 8 fractions  . Hx of colonic polyps   . Hyperlipidemia   . Hypertension   . Insomnia 10/08/2016  .  Malignant neoplasm of upper-outer quadrant of left female breast ([facility] 05/14/2016   not with patient  . Measles as a child  . Mumps as a child  . OA (osteoarthritis) of knee 04/05/2012  . Obesity 07/11/2014  . PONV (postoperative nausea and vomiting)   . Preventative health care 09/05/2013  . PVC's (premature ventricular contractions) 05/02/2016  . Shortness of breath dyspnea    walking distances / climbing stairs  . Sleep apnea 04/05/2012  . Tinea corporis 02/23/2013  . Type 2 diabetes mellitus with hyperglycemia ([facility] 12/31/2013  . Vasomotor rhinitis 04/05/2012  . Ventral hernia     Past Surgical History:  Procedure Laterality Date  . BREAST LUMPECTOMY WITH RADIOACTIVE SEED AND SENTINEL LYMPH NODE BIOPSY Left 06/21/2016   Procedure: LEFT BREAST LUMPECTOMY WITH RADIOACTIVE SEED AND SENTINEL LYMPH NODE BIOPSY, INJECT BLUE DYE LEFT BREAST;  Surgeon: HFanny Skates MD;  [facility]  Service: General;  Laterality: Left;  . CARDIAC CATHETERIZATION     normal coroary arteries per patient  . CARDIOVASCULAR STRESS TEST     10/12/2013  . CESAREAN SECTION     X 3  . CHOLECYSTECTOMY    . COLONOSCOPY WITH PROPOFOL N/A 03/28/2015   Procedure: COLONOSCOPY WITH PROPOFOL;  Surgeon: JJuanita Craver MD;  Location: [facility];  Service: Endoscopy;  Laterality: N/A;  . HERNIA REPAIR  02/08/11   ventral hernia  . JOINT REPLACEMENT     bilateral  . KNEE ARTHROSCOPY  05/2010   bilateral  . LEFT AND RIGHT HEART CATHETERIZATION WITH CORONARY ANGIOGRAM N/A 11/08/2013   Procedure: LEFT AND RIGHT HEART CATHETERIZATION WITH CORONARY ANGIOGRAM;  Surgeon: [name], MD;  Location: [facility] CATH LAB;  Service: Cardiovascular;  Laterality: N/A;  . MENISCUS REPAIR  2009  . MOUTH SURGERY     teeth implants  . PORT-A-CATH REMOVAL N/A 01/24/2017   Procedure: REMOVAL PORT-A-CATH;  Surgeon: Fanny Skates, MD;  Location: [place];  Service: General;  Laterality: N/A;  . PORTACATH PLACEMENT Right 07/16/2016    Procedure: INSERTION PORT-A-CATH RIGHT INTERNAL JUGULAR WITH ULTRASOUND;  Surgeon: Fanny Skates, MD;  Location: [place];  Service: General;  Laterality: Right;  . TOTAL KNEE ARTHROPLASTY  2011   left  . TOTAL KNEE ARTHROPLASTY Right 05/10/2014   Procedure: RIGHT TOTAL KNEE ARTHROPLASTY;  Surgeon: Mauri Pole, MD;  Location: [facility];  Service: Orthopedics;  Laterality: Right;  . TUBAL LIGATION    . WISDOM TOOTH EXTRACTION  2000    Family History  Problem Relation Age of Onset  . Thyroid disease Mother   . Hyperlipidemia Mother   . Heart attack Father 24  . Hypertension Father   . Arthritis Father        RA  . Coronary artery disease Father   . Coronary artery disease Brother   . Heart disease Brother   . Cancer Maternal Aunt        colon  . Cancer Maternal Grandmother        colon  . Heart attack Maternal Grandfather   . Alcohol abuse Paternal Grandfather     Social History   Social History  . Marital status: Divorced    Spouse name: N/A  . Number of children: 3  . Years of education: N/A   Occupational History  . retired - [employer]    Social History Main Topics  . Smoking status: Never Smoker  . Smokeless tobacco: Never Used  . Alcohol use Yes     Comment: rarely  . Drug use: No  . Sexual activity: No     Comment: lives with mother currently-stress   Other Topics Concern  . Not on file   Social History Narrative  . No narrative on file    Outpatient Medications Prior to Visit  Medication Sig Dispense Refill  . acetaminophen (TYLENOL) 500 MG tablet Take 1,000 mg by mouth 2 (two) times daily as needed for moderate pain or headache.    . albuterol (PROVENTIL HFA;VENTOLIN HFA) 108 (90 Base) MCG/ACT inhaler Inhale 2 puffs into the lungs every 6 (six) hours as needed for wheezing or shortness of breath. 1 Inhaler 6  . anastrozole (ARIMIDEX) 1 MG tablet Take 1 tablet (1 mg total) by mouth daily. 90 tablet 3  . aspirin EC 81 MG tablet Take 81 mg by mouth at  bedtime.     Marland Kitchen BYSTOLIC 10 MG tablet TAKE 1 TABLET AT BEDTIME 90 tablet 2  . CARTIA XT 120 MG 24 hr capsule TAKE 1 CAPSULE EVERY DAY 90 capsule 2  . clotrimazole-betamethasone (LOTRISONE) cream Apply 1 application topically 2 (two) times daily as needed (for rash). 30 g 0  . diclofenac (VOLTAREN) 75 MG EC tablet TAKE 1 TABLET TWICE DAILY 180 tablet 1  . escitalopram (LEXAPRO) 20 MG tablet Take 1  tablet (20 mg total) by mouth daily. 90 tablet 1  . esomeprazole (NEXIUM) 40 MG capsule Take 40 mg by mouth daily.     . fluticasone furoate-vilanterol (BREO ELLIPTA) 100-25 MCG/INH AEPB Inhale 1 puff into the lungs daily. 1 each 0  . furosemide (LASIX) 20 MG tablet TAKE 1 TABLET EVERY DAY 90 tablet 2  . Hypromellose (ARTIFICIAL TEARS OP) Place 1 drop into both eyes daily as needed (dry eyes).     Marland Kitchen losartan (COZAAR) 100 MG tablet TAKE 1 TABLET EVERY DAY 90 tablet 2  . metFORMIN (GLUCOPHAGE) 500 MG tablet TAKE 1 TABLET TWICE DAILY WITH MEALS 180 tablet 3  . montelukast (SINGULAIR) 10 MG tablet TAKE 1 TABLET AT BEDTIME 90 tablet 2  . Probiotic Product (PROBIOTIC PO) Take 1 capsule by mouth at bedtime.    . rosuvastatin (CRESTOR) 40 MG tablet Take 1 tablet (40 mg total) by mouth daily. 20 tablet 0  . fluticasone (FLOVENT HFA) 110 MCG/ACT inhaler Inhale 1 puff into the lungs 2 (two) times daily. For Wheezing and COPD (Patient not taking: Reported on 02/17/2017) 1 Inhaler 3   No facility-administered medications prior to visit.     Allergies  Allergen Reactions  . No Known Allergies     Review of Systems  Constitutional: Positive for malaise/fatigue. Negative for fever.  HENT: Positive for congestion and ear pain.   Eyes: Positive for discharge. Negative for blurred vision, double vision, photophobia and redness.  Respiratory: Positive for cough and sputum production. Negative for shortness of breath.   Cardiovascular: Negative for chest pain, palpitations and leg swelling.  Gastrointestinal:  Negative for abdominal pain, blood in stool and nausea.  Genitourinary: Negative for dysuria and frequency.  Musculoskeletal: Negative for falls.  Skin: Negative for rash.  Neurological: Negative for dizziness, loss of consciousness and headaches.  Endo/Heme/Allergies: Negative for environmental allergies.  Psychiatric/Behavioral: Negative for depression. The patient is not nervous/anxious.        Objective:    Physical Exam  Constitutional: She is oriented to person, place, and time. She appears well-developed and well-nourished. No distress.  HENT:  Head: Normocephalic and atraumatic.  Nose: Nose normal.  Wax b/l canals, no irritation noted  Eyes: Right eye exhibits discharge. Left eye exhibits discharge.  Slight, grey  Neck: Normal range of motion. Neck supple.  Cardiovascular: Normal rate and regular rhythm.   No murmur heard. Pulmonary/Chest: Effort normal and breath sounds normal.  Abdominal: Soft. Bowel sounds are normal. There is no tenderness.  Musculoskeletal: She exhibits no edema.  Neurological: She is alert and oriented to person, place, and time.  Skin: Skin is warm and dry.  Psychiatric: She has a normal mood and affect.  Nursing note and vitals reviewed.   BP 132/70 (BP Location: Right Arm, Patient Position: Sitting, Cuff Size: Normal)   Pulse 71   Temp 98.3 F (36.8 C) (Oral)   Resp 14   Ht _0  (1.676 m)   Wt (!) 330 lb 2 oz (149.7 kg)   SpO2 95%   BMI 53.28 kg/m  Wt Readings from Last 3 Encounters:  02/17/17 (!) 330 lb 2 oz (149.7 kg)  02/06/17 (!) 331 lb 3.2 oz (150.2 kg)  01/24/17 (!) 334 lb (151.5 kg)     Lab Results  Component Value Date   WBC 7.0 01/22/2017   HGB 11.8 (L) 01/22/2017   HCT 38.7 01/22/2017   PLT 308 01/22/2017   GLUCOSE 166 (H) 01/22/2017   CHOL 213 (H) 10/08/2016  TRIG (H) 10/08/2016    401.0 Triglyceride is over 400; calculations on Lipids are invalid.   HDL 35.40 (L) 10/08/2016   LDLDIRECT 108.0 10/08/2016    LDLCALC 94 05/24/2016   ALT 36 01/09/2017   AST 40 (H) 01/09/2017   NA 138 01/22/2017   K 4.4 01/22/2017   CL 103 01/22/2017   CREATININE 0.86 01/22/2017   BUN 11 01/22/2017   CO2 26 01/22/2017   TSH 0.95 05/02/2016   INR 1.01 05/03/2014   HGBA1C 7.8 (H) 10/08/2016    Lab Results  Component Value Date   TSH 0.95 05/02/2016   Lab Results  Component Value Date   WBC 7.0 01/22/2017   HGB 11.8 (L) 01/22/2017   HCT 38.7 01/22/2017   MCV 84.5 01/22/2017   PLT 308 01/22/2017   Lab Results  Component Value Date   NA 138 01/22/2017   K 4.4 01/22/2017   CHLORIDE 105 01/09/2017   CO2 26 01/22/2017   GLUCOSE 166 (H) 01/22/2017   BUN 11 01/22/2017   CREATININE 0.86 01/22/2017   BILITOT 0.40 01/09/2017   ALKPHOS 97 01/09/2017   AST 40 (H) 01/09/2017   ALT 36 01/09/2017   PROT 7.0 01/09/2017   ALBUMIN 3.2 (L) 01/09/2017   CALCIUM 9.4 01/22/2017   ANIONGAP 9 01/22/2017   EGFR 67 (L) 01/09/2017   GFR 82.45 10/16/2015   Lab Results  Component Value Date   CHOL 213 (H) 10/08/2016   Lab Results  Component Value Date   HDL 35.40 (L) 10/08/2016   Lab Results  Component Value Date   LDLCALC 94 05/24/2016   Lab Results  Component Value Date   TRIG (H) 10/08/2016    401.0 Triglyceride is over 400; calculations on Lipids are invalid.   Lab Results  Component Value Date   CHOLHDL 6 10/08/2016   Lab Results  Component Value Date   HGBA1C 7.8 (H) 10/08/2016       Assessment & Plan:   Problem List Items Addressed This Visit    Hyperlipidemia    Tolerating statin, encouraged heart healthy diet, avoid trans fats, minimize simple carbs and saturated fats. Increase exercise as tolerated      Relevant Orders   Lipid panel   Essential hypertension    Well controlled, no changes to meds. Encouraged heart healthy diet such as the DASH diet and exercise as tolerated.       Relevant Orders   CBC   Comprehensive metabolic panel   TSH   Obstructive sleep apnea    Saw  pulmonology recently,       Anemia    Increase leafy greens, consider increased lean red meat and using cast iron cookware. Continue to monitor, report any concerns      Dyspnea    Has been following with pulmonology they believe she has asthma. She did not respond well to Flovent. Did well on Qvar but insurance let her have it.is doing better on the [facility] which her pulmonologist gave her a sample      CAD (coronary artery disease), native coronary artery    Heart MRI is scheduled at [facility] this Thursday ordered by Dr [name]      Acute sinusitis with symptoms > 10 days    Started on Omnicef and Mucinex      Relevant Medications   cefdinir (OMNICEF) 300 MG capsule   Type 2 diabetes mellitus with hyperglycemia (HCC)    hgba1c acceptable, minimize simple carbs. Increase exercise as tolerated. Continue  current meds. Am sugars are wnl       Relevant Orders   Hemoglobin A1c   Obesity    Encouraged DASH diet, decrease po intake and increase exercise as tolerated. Needs 7-8 hours of sleep nightly. Avoid trans fats, eat small, frequent meals every 4-5 hours with lean proteins, complex carbs and healthy fats. Minimimize imple carbs      Breast cancer ([place])    S/p chemo, radiation, and removal of port, has oncology follow up in 6 months      Relevant Medications   cefdinir (OMNICEF) 300 MG capsule   Dysuria    Symptoms x 3 days to include dysuria, urinary frequency and urgency, ordered UA and culture. Due to symptoms will Cefdinir twice daily.       Other Visit Diagnoses    Lower urinary tract symptoms (LUTS)    -  Primary   Relevant Orders   POCT Urinalysis Dipstick (Automated) (Completed)   Urinalysis, Routine w reflex microscopic   Urine Culture      I am having [name] start on cefdinir. I am also having her maintain her clotrimazole-betamethasone, esomeprazole, aspirin EC, Hypromellose (ARTIFICIAL TEARS OP), acetaminophen, escitalopram, diclofenac, albuterol,  fluticasone, montelukast, losartan, CARTIA XT, furosemide, rosuvastatin, BYSTOLIC, anastrozole, Probiotic Product (PROBIOTIC PO), fluticasone furoate-vilanterol, and metFORMIN.  Meds ordered this encounter  Medications  . cefdinir (OMNICEF) 300 MG capsule    Sig: Take 1 capsule (300 mg total) by mouth 2 (two) times daily.    Dispense:  20 capsule    Refill:  0     Penni Homans, MD

## 2017-02-17 NOTE — Assessment & Plan Note (Signed)
Heart MRI is scheduled at [facility] this Thursday ordered by Dr [name]

## 2017-02-17 NOTE — Assessment & Plan Note (Signed)
S/p chemo, radiation, and removal of port, has oncology follow up in 6 months

## 2017-02-17 NOTE — Patient Instructions (Signed)
Cranberry tabs and probiotic Mucinex plain twice daily Sinusitis, Adult Sinusitis is soreness and inflammation of your sinuses. Sinuses are hollow spaces in the bones around your face. Your sinuses are located:  Around your eyes.  In the middle of your forehead.  Behind your nose.  In your cheekbones.  Your sinuses and nasal passages are lined with a stringy fluid (mucus). Mucus normally drains out of your sinuses. When your nasal tissues become inflamed or swollen, the mucus can become trapped or blocked so air cannot flow through your sinuses. This allows bacteria, viruses, and funguses to grow, which leads to infection. Sinusitis can develop quickly and last for 7?10 days (acute) or for more than 12 weeks (chronic). Sinusitis often develops after a cold. What are the causes? This condition is caused by anything that creates swelling in the sinuses or stops mucus from draining, including:  Allergies.  Asthma.  Bacterial or viral infection.  Abnormally shaped bones between the nasal passages.  Nasal growths that contain mucus (nasal polyps).  Narrow sinus openings.  Pollutants, such as chemicals or irritants in the air.  A foreign object stuck in the nose.  A fungal infection. This is rare.  What increases the risk? The following factors may make you more likely to develop this condition:  Having allergies or asthma.  Having had a recent cold or respiratory tract infection.  Having structural deformities or blockages in your nose or sinuses.  Having a weak immune system.  Doing a lot of swimming or diving.  Overusing nasal sprays.  Smoking.  What are the signs or symptoms? The main symptoms of this condition are pain and a feeling of pressure around the affected sinuses. Other symptoms include:  Upper toothache.  Earache.  Headache.  Bad breath.  Decreased sense of smell and taste.  A cough that may get worse at night.  Fatigue.  Fever.  Thick  drainage from your nose. The drainage is often green and it may contain pus (purulent).  Stuffy nose or congestion.  Postnasal drip. This is when extra mucus collects in the throat or back of the nose.  Swelling and warmth over the affected sinuses.  Sore throat.  Sensitivity to light.  How is this diagnosed? This condition is diagnosed based on symptoms, a medical history, and a physical exam. To find out if your condition is acute or chronic, your health care provider may:  Look in your nose for signs of nasal polyps.  Tap over the affected sinus to check for signs of infection.  View the inside of your sinuses using an imaging device that has a light attached (endoscope).  If your health care provider suspects that you have chronic sinusitis, you may also:  Be tested for allergies.  Have a sample of mucus taken from your nose (nasal culture) and checked for bacteria.  Have a mucus sample examined to see if your sinusitis is related to an allergy.  If your sinusitis does not respond to treatment and it lasts longer than 8 weeks, you may have an MRI or CT scan to check your sinuses. These scans also help to determine how severe your infection is. In rare cases, a bone biopsy may be done to rule out more serious types of fungal sinus disease. How is this treated? Treatment for sinusitis depends on the cause and whether your condition is chronic or acute. If a virus is causing your sinusitis, your symptoms will go away on their own within 10 days. You  may be given medicines to relieve your symptoms, including:  Topical nasal decongestants. They shrink swollen nasal passages and let mucus drain from your sinuses.  Antihistamines. These drugs block inflammation that is triggered by allergies. This can help to ease swelling in your nose and sinuses.  Topical nasal corticosteroids. These are nasal sprays that ease inflammation and swelling in your nose and sinuses.  Nasal saline  washes. These rinses can help to get rid of thick mucus in your nose.  If your condition is caused by bacteria, you will be given an antibiotic medicine. If your condition is caused by a fungus, you will be given an antifungal medicine. Surgery may be needed to correct underlying conditions, such as narrow nasal passages. Surgery may also be needed to remove polyps. Follow these instructions at home: Medicines  Take, use, or apply over-the-counter and prescription medicines only as told by your health care provider. These may include nasal sprays.  If you were prescribed an antibiotic medicine, take it as told by your health care provider. Do not stop taking the antibiotic even if you start to feel better. Hydrate and Humidify  Drink enough water to keep your urine clear or pale yellow. Staying hydrated will help to thin your mucus.  Use a cool mist humidifier to keep the humidity level in your home above 50%.  Inhale steam for 10-15 minutes, 3-4 times a day or as told by your health care provider. You can do this in the bathroom while a hot shower is running.  Limit your exposure to cool or dry air. Rest  Rest as much as possible.  Sleep with your head raised (elevated).  Make sure to get enough sleep each night. General instructions  Apply a warm, moist washcloth to your face 3-4 times a day or as told by your health care provider. This will help with discomfort.  Wash your hands often with soap and water to reduce your exposure to viruses and other germs. If soap and water are not available, use hand sanitizer.  Do not smoke. Avoid being around people who are smoking (secondhand smoke).  Keep all follow-up visits as told by your health care provider. This is important. Contact a health care provider if:  You have a fever.  Your symptoms get worse.  Your symptoms do not improve within 10 days. Get help right away if:  You have a severe headache.  You have persistent  vomiting.  You have pain or swelling around your face or eyes.  You have vision problems.  You develop confusion.  Your neck is stiff.  You have trouble breathing. This information is not intended to replace advice given to you by your health care provider. Make sure you discuss any questions you have with your health care provider. Document Released: 06/10/2005 Document Revised: 02/04/2016 Document Reviewed: 04/05/2015 Elsevier Interactive Patient Education  2017 [employer].

## 2017-02-17 NOTE — Assessment & Plan Note (Signed)
Well controlled, no changes to meds. Encouraged heart healthy diet such as the DASH diet and exercise as tolerated.  °

## 2017-02-17 NOTE — Assessment & Plan Note (Signed)
Encouraged DASH diet, decrease po intake and increase exercise as tolerated. Needs 7-8 hours of sleep nightly. Avoid trans fats, eat small, frequent meals every 4-5 hours with lean proteins, complex carbs and healthy fats. Minimimize imple carbs

## 2017-02-17 NOTE — Assessment & Plan Note (Signed)
Has been following with pulmonology they believe she has asthma. She did not respond well to Flovent. Did well on Qvar but insurance let her have it.is doing better on the [facility] which her pulmonologist gave her a sample

## 2017-02-18 LAB — URINE CULTURE: ORGANISM ID, BACTERIA: NO GROWTH

## 2017-02-20 ENCOUNTER — Ambulatory Visit: Payer: Medicare HMO

## 2017-02-20 ENCOUNTER — Other Ambulatory Visit: Payer: Medicare HMO

## 2017-02-20 ENCOUNTER — Ambulatory Visit: Payer: Medicare HMO | Admitting: Hematology and Oncology

## 2017-02-20 DIAGNOSIS — R0602 Shortness of breath: Secondary | ICD-10-CM | POA: Diagnosis not present

## 2017-02-20 DIAGNOSIS — I2510 Atherosclerotic heart disease of native coronary artery without angina pectoris: Secondary | ICD-10-CM | POA: Diagnosis not present

## 2017-02-26 DIAGNOSIS — E119 Type 2 diabetes mellitus without complications: Secondary | ICD-10-CM | POA: Diagnosis not present

## 2017-02-28 ENCOUNTER — Telehealth: Payer: Self-pay | Admitting: Cardiology

## 2017-02-28 ENCOUNTER — Telehealth: Payer: Self-pay | Admitting: Internal Medicine

## 2017-02-28 MED ORDER — FLUTICASONE FUROATE-VILANTEROL 100-25 MCG/INH IN AEPB: 1.0000 | INHALATION_SPRAY | Freq: Every day | RESPIRATORY_TRACT | 11 refills | Status: DC

## 2017-02-28 NOTE — Telephone Encounter (Signed)
Ok to [facility] and Rx Breo 100, # 1, inhale 1 puff, then rinse mouth, once daily,   Refill x 12

## 2017-02-28 NOTE — Telephone Encounter (Signed)
New message     Calling to get MRI from [facility] results

## 2017-02-28 NOTE — Telephone Encounter (Signed)
Called and spoke with pt and she is aware of refill of the breo that has been sent to her local pharmacy. Nothing further is needed.

## 2017-02-28 NOTE — Telephone Encounter (Signed)
Per Dr. [name], informed patient MRI showed her EF is normal. Scheduled patient for overdue follow-up with Dr. [name] 10/23. She was grateful for call and agrees with treatment plan.

## 2017-02-28 NOTE — Telephone Encounter (Signed)
       Spoke with the pt  She states she feels like the Breo helped more than flovent and wants rx  See last AVS from 02/06/17   Sample Breo 100    Inhale 1 puff, once daily, then rinse mouth   Try this instead of Flovent/ Qvar                      See if it helps your breathing.  Please call as needed    Instructions      Return in about 1 month (around 03/09/2017).   Please advise thanks!

## 2017-03-10 ENCOUNTER — Encounter: Payer: Self-pay | Admitting: Internal Medicine

## 2017-03-10 ENCOUNTER — Ambulatory Visit (INDEPENDENT_AMBULATORY_CARE_PROVIDER_SITE_OTHER): Payer: Medicare HMO | Admitting: Internal Medicine

## 2017-03-10 VITALS — BP 122/76 | HR 87 | Ht 65.5 in | Wt 327.0 lb

## 2017-03-10 DIAGNOSIS — G4733 Obstructive sleep apnea (adult) (pediatric): Secondary | ICD-10-CM | POA: Diagnosis not present

## 2017-03-10 DIAGNOSIS — R0609 Other forms of dyspnea: Secondary | ICD-10-CM

## 2017-03-10 NOTE — Progress Notes (Signed)
HPI female never smoker followed for OSA, lung restriction, dyspnea on exertion, asthmatic bronchitis, complicated by chronic sinusitis, anxiety and depression, paroxysmal atrial tachycardia, CAD, breast cancer left, morbid obesity Unattended Home Sleep Test 11/05/13-AHI 68.1/hour, desaturation to 62%, body weight 320 pounds Echocardiogram 12/04/16- Nl EF Office Spirometry 02/06/17-moderate restriction of exhaled volume. FVC 2.02/61%, FEV1 1.75/69%, ratio 0.86, FEF 25-75% 2.45/113%  --------------------------------------------------------------------------------------------  02/06/17-66 year old female never smoker followed for OSA.Marland Kitchen Over 3 years since last seen for this problem. Coming to reestablish. Medical problem list includes sinusitis, anxiety and depression, paroxysmal atrial tachycardia, CAD, Breast cancer L Unattended Home Sleep Test 11/05/13-AHI 68.1/hour, desaturation to 62%, body weight 320 pounds CPAP auto 5-15/ [facility] Former [facility] patient for sleep; HST in 2015 and wears CPAP nightly and naptime during the day;DME is [facility]. Pt is concerned about breathing issues-wheezing and SOB-wants to make sure its not the CPAP causing the breathing issues.  Likes her CPAP with nasal pillows mask and breathes comfortably with it at night. CPAP download 100% compliance averaging 12 hours and 58 minutes per day, AHI 0.5/hour. She notices a part of the humidifier reservoir changes color with use, suggesting possible overheating. She describes intermittent wheezing, sinus congestion with drainage and frontal headache. Thinks she was breathing better using Qvar, not covered by insurance, not as good with Flovent. Denies purulent sputum, blood, chest pain, adenopathy. Sometimes feels strangled on her own spit but denies problems swallowing. Just finished XRT. Weight is up 20 pounds over the last 2 years. CXR 11/12/16 No active cardiopulmonary disease. Echocardiogram 12/04/16- Nl EF (Notes recorded by Sueanne Margarita, MD on 12/04/2016 at 3:54 PM EDT: Echo showed low normal LVF with EF 50-55% with moderate LVH, mild LAE. Compared to prior echo EF appears mildly reduced and patient is getting Chemo with new SOB) Office Spirometry 02/06/17-moderate restriction of exhaled volume. FVC 2.02/61%, FEV1 1.75/69%, ratio 0.86, FEF 25-75% 2.45/113%  03/10/17- 66 year old female never smoker followed for OSA, lung restriction, dyspnea on exertion, asthmatic bronchitis, complicated by chronic sinusitis, anxiety and depression, paroxysmal atrial tachycardia, CAD, breast cancer left, morbid obesity CPAP auto 5-15/[facility] We had given sample of Breo 100 to try instead of Flovent or Qvar. FOLLOWS FOR: DME: [facility]. Pt wears CPAP nightly and naps;pt states auto settings work well for her and DL attached. Pt did not hear from [facility] regarding burn smell in CPAP machine.  Download documents great compliance-100% with AHI 0.6/hour. Pressures feel just fine to her. Breo does help. Still more short of breath before chemotherapy. Cardiology has evaluated.  ROS-see HPI   + = Positive Constitutional:    weight loss, night sweats, fevers, chills, fatigue, lassitude. HEENT:   + headaches, difficulty swallowing, tooth/dental problems, sore throat,       sneezing, itching, ear ache, + nasal congestion, post nasal drip, snoring CV:    chest pain, orthopnea, PND, swelling in lower extremities, anasarca,                                                  dizziness, palpitations Resp:   + shortness of breath with exertion or at rest.                productive cough,   non-productive cough, coughing up of blood.              change in color of  mucus.  + wheezing.   Skin:    rash or lesions. GI:  No-   heartburn, indigestion, abdominal pain, nausea, vomiting, diarrhea,                 change in bowel habits, loss of appetite GU: dysuria, change in color of urine, no urgency or frequency.   flank pain. MS:   joint pain, stiffness, decreased  range of motion, back pain. Neuro-     nothing unusual Psych:  change in mood or affect.  depression or anxiety.   memory loss.  OBJ- Physical Exam General- Alert, Oriented, Affect-appropriate, Distress- none acute, + morbidly obese Skin- rash-none, lesions- none, excoriation- none Lymphadenopathy- none Head- atraumatic, + wearing head scarf after chemotherapy            Eyes- Gross vision intact, PERRLA, conjunctivae and secretions clear            Ears- Hearing, canals-normal            Nose- Clear, no-Septal dev, mucus, polyps, erosion, perforation             Throat- Mallampati II , mucosa clear , drainage- none, tonsils- atrophic Neck- flexible , trachea midline, ? stridor-versus resonant inspiratory sound , thyroid R mass identified as" goiter" , carotid no bruit Chest - symmetrical excursion , unlabored           Heart/CV- RRR , no murmur , no gallop  , no rub, nl s1 s2                           - JVD- none , edema- none, stasis changes- none, varices- none           Lung- + coarse breath sounds, slight inspiratory stridor quality. wheeze- none, cough- none , dullness-none, rub- none           Chest wall-  Abd-  Br/ Gen/ Rectal- Not done, not indicated Extrem- cyanosis- none, clubbing, none, atrophy- none, strength- nl Neuro- grossly intact to observation

## 2017-03-10 NOTE — Patient Instructions (Signed)
We can continue CPAP auto 5-15, mask of choice humidifier, supplies, AirView      Dx OSA  Order- schedule PFT   Dx dyspnea on exertion

## 2017-03-10 NOTE — Assessment & Plan Note (Signed)
Download confirms good compliance and control and she definitely feels better off using CPAP no changes required.

## 2017-03-10 NOTE — Assessment & Plan Note (Signed)
Watching for possibility that her known right paratracheal goiter is causing some upper airway obstruction. Cardiology has watched for cancer therapy induced cardiomyopathy. Plan- Continue Breo, schedule PFT

## 2017-03-12 ENCOUNTER — Ambulatory Visit (INDEPENDENT_AMBULATORY_CARE_PROVIDER_SITE_OTHER): Payer: Medicare HMO | Admitting: Internal Medicine

## 2017-03-12 ENCOUNTER — Other Ambulatory Visit: Payer: Self-pay | Admitting: Family Medicine

## 2017-03-12 DIAGNOSIS — R0609 Other forms of dyspnea: Secondary | ICD-10-CM

## 2017-03-12 DIAGNOSIS — Z96651 Presence of right artificial knee joint: Secondary | ICD-10-CM | POA: Diagnosis not present

## 2017-03-12 DIAGNOSIS — G4733 Obstructive sleep apnea (adult) (pediatric): Secondary | ICD-10-CM | POA: Diagnosis not present

## 2017-03-12 LAB — PULMONARY FUNCTION TEST
DL/VA % PRED: 121 %
DL/VA: 5.96 ml/min/mmHg/L
DLCO cor % pred: 88 %
DLCO cor: 22.64 ml/min/mmHg
DLCO unc % pred: 86 %
DLCO unc: 22.14 ml/min/mmHg
FEF 25-75 Post: 1.83 L/sec
FEF 25-75 Pre: 1.15 L/sec
FEF2575-%CHANGE-POST: 58 %
FEF2575-%Pred-Post: 85 %
FEF2575-%Pred-Pre: 53 %
FEV1-%CHANGE-POST: 25 %
FEV1-%PRED-PRE: 60 %
FEV1-%Pred-Post: 76 %
FEV1-POST: 1.88 L
FEV1-PRE: 1.49 L
FEV1FVC-%CHANGE-POST: 28 %
FEV1FVC-%Pred-Pre: 84 %
FEV6-%CHANGE-POST: -2 %
FEV6-%PRED-PRE: 73 %
FEV6-%Pred-Post: 71 %
FEV6-PRE: 2.28 L
FEV6-Post: 2.22 L
FEV6FVC-%PRED-PRE: 104 %
FEV6FVC-%Pred-Post: 104 %
FVC-%CHANGE-POST: -2 %
FVC-%PRED-POST: 69 %
FVC-%Pred-Pre: 70 %
FVC-POST: 2.25 L
FVC-Pre: 2.29 L
POST FEV1/FVC RATIO: 84 %
PRE FEV6/FVC RATIO: 100 %
Post FEV6/FVC ratio: 100 %
Pre FEV1/FVC ratio: 65 %
RV % PRED: 78 %
RV: 1.69 L
TLC % PRED: 80 %
TLC: 4.17 L

## 2017-03-12 NOTE — Progress Notes (Signed)
PFT done today. 

## 2017-03-14 ENCOUNTER — Telehealth: Payer: Self-pay | Admitting: Internal Medicine

## 2017-03-14 NOTE — Telephone Encounter (Signed)
PFT showed moderate obstruction to airflow, with good response to the bronchodilator. This is an asthma pattern.

## 2017-03-14 NOTE — Telephone Encounter (Signed)
Spoke with pt, who is requesting PFT results from 03/12/17.   CY please advise. Thanks.

## 2017-03-14 NOTE — Telephone Encounter (Signed)
Spoke with pt, aware of results.  Nothing further needed.  

## 2017-04-02 ENCOUNTER — Other Ambulatory Visit: Payer: Self-pay | Admitting: Family Medicine

## 2017-04-02 DIAGNOSIS — Z853 Personal history of malignant neoplasm of breast: Secondary | ICD-10-CM

## 2017-04-09 ENCOUNTER — Telehealth: Payer: Self-pay

## 2017-04-09 NOTE — Telephone Encounter (Signed)
Spoke with patient to remind of SCP visit on 10/22 at 2 pm.  Patient had no questions about appt and stated she would come to appt.

## 2017-04-11 ENCOUNTER — Other Ambulatory Visit (HOSPITAL_BASED_OUTPATIENT_CLINIC_OR_DEPARTMENT_OTHER): Payer: Medicare HMO

## 2017-04-11 ENCOUNTER — Telehealth: Payer: Self-pay | Admitting: Hematology and Oncology

## 2017-04-11 ENCOUNTER — Ambulatory Visit (HOSPITAL_BASED_OUTPATIENT_CLINIC_OR_DEPARTMENT_OTHER): Payer: Medicare HMO | Admitting: Hematology and Oncology

## 2017-04-11 DIAGNOSIS — I509 Heart failure, unspecified: Secondary | ICD-10-CM | POA: Diagnosis not present

## 2017-04-11 DIAGNOSIS — Z170 Estrogen receptor positive status [ER+]: Secondary | ICD-10-CM

## 2017-04-11 DIAGNOSIS — R197 Diarrhea, unspecified: Secondary | ICD-10-CM

## 2017-04-11 DIAGNOSIS — C50412 Malignant neoplasm of upper-outer quadrant of left female breast: Secondary | ICD-10-CM

## 2017-04-11 LAB — CBC WITH DIFFERENTIAL/PLATELET
BASO%: 0.4 % (ref 0.0–2.0)
BASOS ABS: 0 10*3/uL (ref 0.0–0.1)
EOS ABS: 0.1 10*3/uL (ref 0.0–0.5)
EOS%: 0.9 % (ref 0.0–7.0)
HEMATOCRIT: 39.1 % (ref 34.8–46.6)
HEMOGLOBIN: 12.2 g/dL (ref 11.6–15.9)
LYMPH#: 1.4 10*3/uL (ref 0.9–3.3)
LYMPH%: 22.3 % (ref 14.0–49.7)
MCH: 25.4 pg (ref 25.1–34.0)
MCHC: 31.2 g/dL — AB (ref 31.5–36.0)
MCV: 81.6 fL (ref 79.5–101.0)
MONO#: 0.3 10*3/uL (ref 0.1–0.9)
MONO%: 5.3 % (ref 0.0–14.0)
NEUT#: 4.5 10*3/uL (ref 1.5–6.5)
NEUT%: 71.1 % (ref 38.4–76.8)
PLATELETS: 240 10*3/uL (ref 145–400)
RBC: 4.79 10*6/uL (ref 3.70–5.45)
RDW: 19.2 % — AB (ref 11.2–14.5)
WBC: 6.4 10*3/uL (ref 3.9–10.3)

## 2017-04-11 LAB — COMPREHENSIVE METABOLIC PANEL
ALT: 40 U/L (ref 0–55)
ANION GAP: 12 meq/L — AB (ref 3–11)
AST: 41 U/L — ABNORMAL HIGH (ref 5–34)
Albumin: 3.6 g/dL (ref 3.5–5.0)
Alkaline Phosphatase: 93 U/L (ref 40–150)
BUN: 15.1 mg/dL (ref 7.0–26.0)
CHLORIDE: 107 meq/L (ref 98–109)
CO2: 26 mEq/L (ref 22–29)
Calcium: 10 mg/dL (ref 8.4–10.4)
Creatinine: 0.8 mg/dL (ref 0.6–1.1)
GLUCOSE: 156 mg/dL — AB (ref 70–140)
Potassium: 4.4 mEq/L (ref 3.5–5.1)
SODIUM: 145 meq/L (ref 136–145)
TOTAL PROTEIN: 7.5 g/dL (ref 6.4–8.3)
Total Bilirubin: 0.48 mg/dL (ref 0.20–1.20)

## 2017-04-11 NOTE — Progress Notes (Signed)
Patient Care Team: [name], MD as PCP - General (Family Medicine) Fanny Skates, MD as Consulting Physician (General Surgery) Nicholas Lose, MD as Consulting Physician (Hematology and Oncology) Gery Pray, MD as Consulting Physician (Radiation Oncology) [name], NP as Nurse Practitioner (Hematology and Oncology)  DIAGNOSIS:  Encounter Diagnosis  Name Primary?  . Malignant neoplasm of upper-outer quadrant of left breast in female, estrogen receptor positive ([place])     SUMMARY OF ONCOLOGIC HISTORY:   Malignant neoplasm of upper-outer quadrant of left female breast ([place])   05/13/2016 Initial Diagnosis    Left breast biopsy 3:00: IDC, grade 3, ER 30%, PR 0%, Ki-67 40%, HER-2 positive ratio 2.71, screening detected left breast mass 9 x 7 x 6 mm, axilla negative, T1 BN 0 stage IA clinical stage      06/21/2016 Surgery    Left lumpectomy: IDC grade 3, 1.1 cm, 1/5 LN Positive with ECE, Margins Neg, LVI Present; Er 30%, PR 0%, Ki 67 40%, Her 2 Positive Ratio 2.71; T1CN1 (Stage 2B)      07/25/2016 - 10/10/2016 Chemotherapy    Taxol weekly 12; Herceptin and Perjeta every 3 weeks       10/31/2016 - 11/28/2016 Radiation Therapy    Adjuvant radiation therapy      12/2016 -  Anti-estrogen oral therapy    Anastrozole daily       CHIEF COMPLIANT: follow-up on anastrozole therapy  INTERVAL HISTORY: [name] is a 66 year old with above-mentioned history of left breast cancer treated with lumpectomy followed by adjuvant chemotherapy with Taxol Herceptin Perjeta. She completed adjuvant radiation and is currently on anastrozole therapy. She is tolerating anastrozole extremely well. She denies any hot flashes or myalgias. Denies lumps or nodules in the breast. Pathology also improving. Diarrhea is almost gone.  REVIEW OF SYSTEMS:   Constitutional: Denies fevers, chills or abnormal weight loss Eyes: Denies blurriness of vision Ears, nose, mouth, throat, and  face: Denies mucositis or sore throat Respiratory: Denies cough, dyspnea or wheezes Cardiovascular: Denies palpitation, chest discomfort Gastrointestinal:  Intermittent diarrhea Skin: Denies abnormal skin rashes Lymphatics: Denies new lymphadenopathy or easy bruising Neurological:Denies numbness, tingling or new weaknesses Behavioral/Psych: Mood is stable, no new changes  Extremities: No lower extremity edema Breast:  denies any pain or lumps or nodules in either breasts All other systems were reviewed with the patient and are negative.  I have reviewed the past medical history, past surgical history, social history and family history with the patient and they are unchanged from previous note.  ALLERGIES:  is allergic to no known allergies.  MEDICATIONS:  Current Outpatient Prescriptions  Medication Sig Dispense Refill  . acetaminophen (TYLENOL) 500 MG tablet Take 1,000 mg by mouth 2 (two) times daily as needed for moderate pain or headache.    . albuterol (PROVENTIL HFA;VENTOLIN HFA) 108 (90 Base) MCG/ACT inhaler Inhale 2 puffs into the lungs every 6 (six) hours as needed for wheezing or shortness of breath. 1 Inhaler 6  . anastrozole (ARIMIDEX) 1 MG tablet Take 1 tablet (1 mg total) by mouth daily. 90 tablet 3  . aspirin EC 81 MG tablet Take 81 mg by mouth at bedtime.     Marland Kitchen BYSTOLIC 10 MG tablet TAKE 1 TABLET AT BEDTIME 90 tablet 2  . CARTIA XT 120 MG 24 hr capsule TAKE 1 CAPSULE EVERY DAY 90 capsule 2  . clotrimazole-betamethasone (LOTRISONE) cream Apply 1 application topically 2 (two) times daily as needed (for rash). 30 g 0  .  diclofenac (VOLTAREN) 75 MG EC tablet Take 1 tablet (75 mg total) by mouth 2 (two) times daily. 180 tablet 1  . escitalopram (LEXAPRO) 20 MG tablet Take 1 tablet (20 mg total) by mouth daily. 90 tablet 1  . esomeprazole (NEXIUM) 40 MG capsule Take 40 mg by mouth daily.     . fluticasone furoate-vilanterol (BREO ELLIPTA) 100-25 MCG/INH AEPB Inhale 1 puff into  the lungs daily. 1 each 11  . furosemide (LASIX) 20 MG tablet TAKE 1 TABLET EVERY DAY 90 tablet 2  . Hypromellose (ARTIFICIAL TEARS OP) Place 1 drop into both eyes daily as needed (dry eyes).     Marland Kitchen losartan (COZAAR) 100 MG tablet TAKE 1 TABLET EVERY DAY 90 tablet 2  . metFORMIN (GLUCOPHAGE) 500 MG tablet TAKE 1 TABLET TWICE DAILY WITH MEALS 180 tablet 3  . montelukast (SINGULAIR) 10 MG tablet TAKE 1 TABLET AT BEDTIME 90 tablet 2  . Probiotic Product (PROBIOTIC PO) Take 1 capsule by mouth at bedtime.    . rosuvastatin (CRESTOR) 20 MG tablet TAKE 1 TABLET AT BEDTIME 90 tablet 0  . rosuvastatin (CRESTOR) 40 MG tablet Take 1 tablet (40 mg total) by mouth daily. 20 tablet 0   No current facility-administered medications for this visit.     PHYSICAL EXAMINATION: ECOG PERFORMANCE STATUS: 1 - Symptomatic but completely ambulatory  Vitals:   04/11/17 1029  BP: 139/75  Pulse: 78  Resp: 20  Temp: 98.9 F (37.2 C)  SpO2: 95%   Filed Weights   04/11/17 1029  Weight: (!) 325 lb (147.4 kg)    GENERAL:alert, no distress and comfortable SKIN: skin color, texture, turgor are normal, no rashes or significant lesions EYES: normal, Conjunctiva are pink and non-injected, sclera clear OROPHARYNX:no exudate, no erythema and lips, buccal mucosa, and tongue normal  NECK: supple, thyroid normal size, non-tender, without nodularity LYMPH:  no palpable lymphadenopathy in the cervical, axillary or inguinal LUNGS: clear to auscultation and percussion with normal breathing effort HEART: regular rate & rhythm and no murmurs and no lower extremity edema ABDOMEN:abdomen soft, non-tender and normal bowel sounds MUSCULOSKELETAL:no cyanosis of digits and no clubbing  NEURO: alert & oriented x 3 with fluent speech, no focal motor/sensory deficits EXTREMITIES: No lower extremity edema  LABORATORY DATA:  I have reviewed the data as listed   Chemistry      Component Value Date/Time   NA 145 04/11/2017 0942    K 4.4 04/11/2017 0942   CL 102 02/17/2017 0855   CO2 26 04/11/2017 0942   BUN 15.1 04/11/2017 0942   CREATININE 0.8 04/11/2017 0942      Component Value Date/Time   CALCIUM 10.0 04/11/2017 0942   ALKPHOS 93 04/11/2017 0942   AST 41 (H) 04/11/2017 0942   ALT 40 04/11/2017 0942   BILITOT 0.48 04/11/2017 0942       Lab Results  Component Value Date   WBC 6.4 04/11/2017   HGB 12.2 04/11/2017   HCT 39.1 04/11/2017   MCV 81.6 04/11/2017   PLT 240 04/11/2017   NEUTROABS 4.5 04/11/2017    ASSESSMENT & PLAN:  Malignant neoplasm of upper-outer quadrant of left female breast ([place]) 06/21/16: Left lumpectomy: IDC grade 3, 1.1 cm, 1/5 LN Positive with ECE, Margins Neg, LVI Present; Er 30%, PR 0%, Ki 67 40%, Her 2 Positive Ratio 2.71; T1CN1 (Stage 2B)  Patient has multiple comorbidities including history of LVH with congestive heart failure  Treatment Plan: 1. Adjuvant Taxol with Herceptin And Perjeta (Last echocardiogram  showed an ejection fraction of 55-60%-- repeat on 4/3/2018EF 60-65%) completed 01/09/2017 2. Followed by adjuvant radiation 3. Followed by adjuvant antiestrogen therapy with anastrozole 1 mg daily 5 years (bone density 04/29/2016 T score -1.2) ----------------------------------------------------------------------------------------------------------------------------------------------------- Current Treatment: Anastrozole 1 mg daily started 04/29/2016 Anastrozole toxicities: Denies any hot flashes or myalgias Surveillance: Patient will be set up for mammograms. Return to clinic in 6 months and after that we can see her once a year   I spent 25 minutes talking to the patient of which more than half was spent in counseling and coordination of care.  No orders of the defined types were placed in this encounter.  The patient has a good understanding of the overall plan. she agrees with it. she will call with any problems that may develop before the next visit  here.   Rulon Eisenmenger, MD 04/11/17

## 2017-04-11 NOTE — Telephone Encounter (Signed)
Gave patient AVS and calendar of upcoming April 2019 appointments. °

## 2017-04-11 NOTE — Assessment & Plan Note (Signed)
06/21/16: Left lumpectomy: IDC grade 3, 1.1 cm, 1/5 LN Positive with ECE, Margins Neg, LVI Present; Er 30%, PR 0%, Ki 67 40%, Her 2 Positive Ratio 2.71; T1CN1 (Stage 2B)  Patient has multiple comorbidities including history of LVH with congestive heart failure  Treatment Plan: 1. Adjuvant Taxol with Herceptin And Perjeta (Last echocardiogram showed an ejection fraction of 55-60%-- repeat on 4/3/2018EF 60-65%) completed 01/09/2017 2. Followed by adjuvant radiation 3. Followed by adjuvant antiestrogen therapy with anastrozole 1 mg daily 5 years (bone density 04/29/2016 T score -1.2) ----------------------------------------------------------------------------------------------------------------------------------------------------- Current Treatment: Anastrozole 1 mg daily started 04/29/2016 Anastrozole toxicities: Denies any hot flashes or myalgias Surveillance: Patient will be set up for mammograms. Return to clinic in 6 months and after that we can see her once a year

## 2017-04-14 ENCOUNTER — Encounter: Payer: Self-pay | Admitting: Adult Health

## 2017-04-14 ENCOUNTER — Ambulatory Visit (HOSPITAL_BASED_OUTPATIENT_CLINIC_OR_DEPARTMENT_OTHER): Payer: Medicare HMO | Admitting: Adult Health

## 2017-04-14 VITALS — BP 148/81 | HR 67 | Temp 98.4°F | Resp 18 | Ht 65.5 in | Wt 324.3 lb

## 2017-04-14 DIAGNOSIS — M8580 Other specified disorders of bone density and structure, unspecified site: Secondary | ICD-10-CM | POA: Diagnosis not present

## 2017-04-14 DIAGNOSIS — C50412 Malignant neoplasm of upper-outer quadrant of left female breast: Secondary | ICD-10-CM

## 2017-04-14 DIAGNOSIS — N63 Unspecified lump in unspecified breast: Secondary | ICD-10-CM | POA: Diagnosis not present

## 2017-04-14 DIAGNOSIS — Z170 Estrogen receptor positive status [ER+]: Secondary | ICD-10-CM | POA: Diagnosis not present

## 2017-04-14 NOTE — Progress Notes (Signed)
Cardiology Office Note:    Date:  04/15/2017   ID:  RHODIA ACRES, DOB [birth year], MRN [id number]  PCP:  [name], MD  Cardiologist:  Fransico Him, MD   Referring MD: [name], MD   Chief Complaint  Patient presents with  . Congestive Heart Failure  . Hypertension  . Coronary Artery Disease  . Hyperlipidemia    History of Present Illness:    [name] is a 66 y.o. female with a hx of HTN, chronic diastolic CHF, nonsustained atrial tachycardia, Cath with nonobstructive ASCAD with 20% mid LAD, 20% prox left circ and 20% ostial RCA with elevated LVEDP and dyslipidemia.  She has just finished all her chemo and XRT for breast CA. She is here today for followup and is doing well.  She denies any chest pain or pressure,  PND, orthopnea, LE edema, dizziness, palpitations or syncope. She has had some problems with SOB since taking chemo and has seen Pulmonary and placed on inhalers which has improved her symptoms.  She is compliant with her meds and is tolerating meds with no SE.    Past Medical History:  Diagnosis Date  . Anemia 04/05/2012  . Anxiety   . Anxiety state 09/11/2007   Qualifier: Diagnosis of  By: Scherrie Gerlach    . Atrial tachycardia, paroxysmal ([place])   . Back pain 10/02/2014  . Breast cancer ([place]) 02/17/2017  . Chicken pox as a child  . Chronic diastolic CHF (congestive heart failure), NYHA class 1 ([place])   . Complication of anesthesia    pt states feels different in her body after anesthesia when waking up and also experiences a smell of burnt plastic for approx a wk   . Dilated aortic root ([place])    56mm by echo 09/2016  . Dysuria 02/17/2017  . Elevated LFTs 04/05/2012  . GERD (gastroesophageal reflux disease)   . Goiter   . Heart murmur    hx of one at birth   . History of kidney stones   . History of radiation therapy 10/31/16-12/17/16   left breast 45 Gy in 25 fractions, left breast boost 16 Gy in 8 fractions  . Hx of colonic polyps   .  Hyperlipidemia   . Hypertension   . Insomnia 10/08/2016  . Malignant neoplasm of upper-outer quadrant of left female breast ([place]) 05/14/2016   not with patient  . Measles as a child  . Mumps as a child  . OA (osteoarthritis) of knee 04/05/2012  . Obesity 07/11/2014  . PONV (postoperative nausea and vomiting)   . Preventative health care 09/05/2013  . PVC's (premature ventricular contractions) 05/02/2016  . Shortness of breath dyspnea    walking distances / climbing stairs  . Sleep apnea 04/05/2012  . Tinea corporis 02/23/2013  . Type 2 diabetes mellitus with hyperglycemia ([place]) 12/31/2013  . Vasomotor rhinitis 04/05/2012  . Ventral hernia     Past Surgical History:  Procedure Laterality Date  . BREAST LUMPECTOMY WITH RADIOACTIVE SEED AND SENTINEL LYMPH NODE BIOPSY Left 06/21/2016   Procedure: LEFT BREAST LUMPECTOMY WITH RADIOACTIVE SEED AND SENTINEL LYMPH NODE BIOPSY, INJECT BLUE DYE LEFT BREAST;  Surgeon: Fanny Skates, MD;  Location: [facility];  Service: General;  Laterality: Left;  . CARDIAC CATHETERIZATION     normal coroary arteries per patient  . CARDIOVASCULAR STRESS TEST     10/12/2013  . CESAREAN SECTION     X 3  . CHOLECYSTECTOMY    . COLONOSCOPY WITH  PROPOFOL N/A 03/28/2015   Procedure: COLONOSCOPY WITH PROPOFOL;  Surgeon: Juanita Craver, MD;  Location: [facility];  Service: Endoscopy;  Laterality: N/A;  . HERNIA REPAIR  02/08/11   ventral hernia  . JOINT REPLACEMENT     bilateral  . KNEE ARTHROSCOPY  05/2010   bilateral  . LEFT AND RIGHT HEART CATHETERIZATION WITH CORONARY ANGIOGRAM N/A 11/08/2013   Procedure: LEFT AND RIGHT HEART CATHETERIZATION WITH CORONARY ANGIOGRAM;  Surgeon: [name], MD;  Location: [facility] CATH LAB;  Service: Cardiovascular;  Laterality: N/A;  . MENISCUS REPAIR  2009  . MOUTH SURGERY     teeth implants  . PORT-A-CATH REMOVAL N/A 01/24/2017   Procedure: REMOVAL PORT-A-CATH;  Surgeon: Fanny Skates, MD;  Location: [facility];  Service: General;   Laterality: N/A;  . PORTACATH PLACEMENT Right 07/16/2016   Procedure: INSERTION PORT-A-CATH RIGHT INTERNAL JUGULAR WITH ULTRASOUND;  Surgeon: Fanny Skates, MD;  Location: [facility];  Service: General;  Laterality: Right;  . TOTAL KNEE ARTHROPLASTY  2011   left  . TOTAL KNEE ARTHROPLASTY Right 05/10/2014   Procedure: RIGHT TOTAL KNEE ARTHROPLASTY;  Surgeon: Mauri Pole, MD;  Location: [facility];  Service: Orthopedics;  Laterality: Right;  . TUBAL LIGATION    . WISDOM TOOTH EXTRACTION  2000    Current Medications: Current Meds  Medication Sig  . acetaminophen (TYLENOL) 500 MG tablet Take 1,000 mg by mouth 2 (two) times daily as needed for moderate pain or headache.  . albuterol (PROVENTIL HFA;VENTOLIN HFA) 108 (90 Base) MCG/ACT inhaler Inhale 2 puffs into the lungs every 6 (six) hours as needed for wheezing or shortness of breath.  . anastrozole (ARIMIDEX) 1 MG tablet Take 1 tablet (1 mg total) by mouth daily.  Marland Kitchen aspirin EC 81 MG tablet Take 81 mg by mouth at bedtime.   Marland Kitchen BYSTOLIC 10 MG tablet TAKE 1 TABLET AT BEDTIME  . CARTIA XT 120 MG 24 hr capsule TAKE 1 CAPSULE EVERY DAY  . diclofenac (VOLTAREN) 75 MG EC tablet Take 1 tablet (75 mg total) by mouth 2 (two) times daily.  Marland Kitchen escitalopram (LEXAPRO) 20 MG tablet Take 1 tablet (20 mg total) by mouth daily.  Marland Kitchen esomeprazole (NEXIUM) 40 MG capsule Take 40 mg by mouth daily.   . fluticasone furoate-vilanterol (BREO ELLIPTA) 100-25 MCG/INH AEPB Inhale 1 puff into the lungs daily.  . furosemide (LASIX) 20 MG tablet TAKE 1 TABLET EVERY DAY  . Hypromellose (ARTIFICIAL TEARS OP) Place 1 drop into both eyes daily as needed (dry eyes).   Marland Kitchen losartan (COZAAR) 100 MG tablet TAKE 1 TABLET EVERY DAY  . metFORMIN (GLUCOPHAGE) 500 MG tablet TAKE 1 TABLET TWICE DAILY WITH MEALS  . montelukast (SINGULAIR) 10 MG tablet TAKE 1 TABLET AT BEDTIME  . Probiotic Product (PROBIOTIC PO) Take 1 capsule by mouth at bedtime.  . rosuvastatin (CRESTOR) 40 MG tablet Take 1  tablet (40 mg total) by mouth daily.     Allergies:   No known allergies   Social History   Social History  . Marital status: Divorced    Spouse name: N/A  . Number of children: 3  . Years of education: N/A   Occupational History  . retired - [employer]    Social History Main Topics  . Smoking status: Never Smoker  . Smokeless tobacco: Never Used  . Alcohol use Yes     Comment: rarely  . Drug use: No  . Sexual activity: No     Comment: lives with mother  currently-stress   Other Topics Concern  . None   Social History Narrative  . None     Family History: The patient's family history includes Alcohol abuse in her paternal grandfather; Arthritis in her father; Cancer in her maternal aunt and maternal grandmother; Coronary artery disease in her brother and father; Heart attack in her maternal grandfather; Heart attack (age of onset: 42) in her father; Heart disease in her brother; Hyperlipidemia in her mother; Hypertension in her father; Thyroid disease in her mother.  ROS:   Please see the history of present illness.    ROS  All other systems reviewed and negative.   EKGs/Labs/Other Studies Reviewed:    The following studies were reviewed today: none  EKG:  EKG is  ordered today.  The ekg ordered today demonstrates NSR with IRBBB with trigeminal PVCs, septal infarct, LAFB  Recent Labs: 05/02/2016: Magnesium 2.0 02/17/2017: TSH 1.84 04/11/2017: ALT 40; BUN 15.1; Creatinine 0.8; HGB 12.2; Platelets 240; Potassium 4.4; Sodium 145   Recent Lipid Panel    Component Value Date/Time   CHOL 147 02/17/2017 0855   TRIG 172.0 (H) 02/17/2017 0855   HDL 33.90 (L) 02/17/2017 0855   CHOLHDL 4 02/17/2017 0855   VLDL 34.4 02/17/2017 0855   LDLCALC 78 02/17/2017 0855   LDLDIRECT 108.0 10/08/2016 0849    Physical Exam:    VS:  BP (!) 144/94   Ht 5' 5.5" (1.664 m)   Wt (!) 326 lb 12.8 oz (148.2 kg)   BMI 53.56 kg/m     Wt Readings from Last 3 Encounters:  04/15/17  (!) 326 lb 12.8 oz (148.2 kg)  04/14/17 (!) 324 lb 4.8 oz (147.1 kg)  04/11/17 (!) 325 lb (147.4 kg)     GEN:  Well nourished, well developed in no acute distress HEENT: Normal NECK: No JVD; No carotid bruits LYMPHATICS: No lymphadenopathy CARDIAC: RRR, no murmurs, rubs, gallops RESPIRATORY:  Clear to auscultation without rales, wheezing or rhonchi  ABDOMEN: Soft, non-tender, non-distended MUSCULOSKELETAL:  No edema; No deformity  SKIN: Warm and dry NEUROLOGIC:  Alert and oriented x 3 PSYCHIATRIC:  Normal affect   ASSESSMENT:    1. Coronary artery disease involving native coronary artery of native heart without angina pectoris   2. Chronic diastolic CHF (congestive heart failure) ([place])   3. Essential hypertension   4. Atrial tachycardia, paroxysmal ([place])   5. Dilated aortic root ([facility])   6. Pure hypercholesterolemia    PLAN:    In order of problems listed above:  1.  ASCAD - Cath with nonobstructive ASCAD with 20% mid LAD, 20% prox left circ and 20% ostial RCA with elevated LVEDP.  She denies any anginal symptoms.  She will continue on ASA 81mg  daily, statin and BB.   2.  Chronic diastolic CHF - she appears euvolemic on exam today and weight is stable.  She just had a Cardiac MRI in August after chemo and EF was 65%. She will continue on Lasix 20mg  daily.   3.  HTN - BP is fairly well controlled on exam today.  She will continue on Bystolic 10mg  daily, Cartia XT 120mg  daily, Losartan 100mg  daily.  4.  Paroxysmal atrial tachycardia - she is maintaining NSR.  She will continue on Bystolic 10mg  daily and Cartia XT 120mg  daily.    5.  Dilated aortic root at 4.1cm - not see on Chest CT 2017 or last echo   6.  Hyperlipidemia with LDL goal < 70.  She will continue  on Crestor 40mg  daily.  Last LDL was 78.  I will add Zetia 10mg  daily and repeat FLP and ALT in 8 weeks.    Medication Adjustments/Labs and Tests Ordered: Current medicines are reviewed at length with the patient today.   Concerns regarding medicines are outlined above.  No orders of the defined types were placed in this encounter.  No orders of the defined types were placed in this encounter.   Signed, Fransico Him, MD  04/15/2017 9:48 AM    [place]

## 2017-04-14 NOTE — Progress Notes (Signed)
CLINIC:  Survivorship   REASON FOR VISIT:  Routine follow-up post-treatment for a recent history of breast cancer.  BRIEF ONCOLOGIC HISTORY:    Malignant neoplasm of upper-outer quadrant of left female breast ([place])   05/13/2016 Initial Diagnosis    Left breast biopsy 3:00: IDC, grade 3, ER 30%, PR 0%, Ki-67 40%, HER-2 positive ratio 2.71, screening detected left breast mass 9 x 7 x 6 mm, axilla negative, T1 BN 0 stage IA clinical stage      06/21/2016 Surgery    Left lumpectomy: IDC grade 3, 1.1 cm, 1/5 LN Positive with ECE, Margins Neg, LVI Present; Er 30%, PR 0%, Ki 67 40%, Her 2 Positive Ratio 2.71; T1CN1 (Stage 2B)      07/25/2016 - 10/10/2016 Chemotherapy    Taxol weekly 12; Herceptin and Perjeta every 3 weeks       10/31/2016 - 11/28/2016 Radiation Therapy    Adjuvant radiation therapy      12/2016 -  Anti-estrogen oral therapy    Anastrozole daily       INTERVAL HISTORY:  [name] presents to the [facility] today for our initial meeting to review her survivorship care plan detailing her treatment course for breast cancer, as well as monitoring long-term side effects of that treatment, education regarding health maintenance, screening, and overall wellness and health promotion.     Overall, [name] reports feeling quite well.  She is taking Anastrozole and is tolerating it well.  She has had some post surgical pains, that have been normal.      REVIEW OF SYSTEMS:  Review of Systems  Constitutional: Negative for appetite change, chills, fatigue, fever and unexpected weight change.  HENT:   Negative for hearing loss and lump/mass.   Eyes: Negative for eye problems and icterus.  Respiratory: Negative for chest tightness, cough and shortness of breath.   Cardiovascular: Negative for chest pain, leg swelling and palpitations.  Gastrointestinal: Negative for abdominal distention, abdominal pain, constipation, diarrhea, nausea and vomiting.  Endocrine:  Negative for hot flashes.  Musculoskeletal: Negative for arthralgias.  Skin: Negative for itching and rash.  Neurological: Negative for dizziness, extremity weakness, headaches and numbness.  Hematological: Negative for adenopathy. Does not bruise/bleed easily.  Psychiatric/Behavioral: Negative for depression. The patient is not nervous/anxious.   Breast: Continued breast swelling of left breast     ONCOLOGY TREATMENT TEAM:  1. Surgeon:  Dr. Dalbert Batman at [facility] 2. Medical Oncologist: Dr. Lindi Adie  3. Radiation Oncologist: Dr. Sondra Come    PAST MEDICAL/SURGICAL HISTORY:  Past Medical History:  Diagnosis Date  . Anemia 04/05/2012  . Anxiety   . Anxiety state 09/11/2007   Qualifier: Diagnosis of  By: Scherrie Gerlach    . Atrial tachycardia, paroxysmal ([place])   . Back pain 10/02/2014  . Breast cancer ([place]) 02/17/2017  . Chicken pox as a child  . Chronic diastolic CHF (congestive heart failure), NYHA class 1 ([place])   . Complication of anesthesia    pt states feels different in her body after anesthesia when waking up and also experiences a smell of burnt plastic for approx a wk   . Dilated aortic root ([place])    10m by echo 09/2016  . Dysuria 02/17/2017  . Elevated LFTs 04/05/2012  . GERD (gastroesophageal reflux disease)   . Goiter   . Heart murmur    hx of one at birth   . History of kidney stones   . History of radiation therapy 10/31/16-12/17/16  left breast 45 Gy in 25 fractions, left breast boost 16 Gy in 8 fractions  . Hx of colonic polyps   . Hyperlipidemia   . Hypertension   . Insomnia 10/08/2016  . Malignant neoplasm of upper-outer quadrant of left female breast ([place]) 05/14/2016   not with patient  . Measles as a child  . Mumps as a child  . OA (osteoarthritis) of knee 04/05/2012  . Obesity 07/11/2014  . PONV (postoperative nausea and vomiting)   . Preventative health care 09/05/2013  . PVC's (premature ventricular contractions) 05/02/2016  . Shortness of  breath dyspnea    walking distances / climbing stairs  . Sleep apnea 04/05/2012  . Tinea corporis 02/23/2013  . Type 2 diabetes mellitus with hyperglycemia ([place]) 12/31/2013  . Vasomotor rhinitis 04/05/2012  . Ventral hernia    Past Surgical History:  Procedure Laterality Date  . BREAST LUMPECTOMY WITH RADIOACTIVE SEED AND SENTINEL LYMPH NODE BIOPSY Left 06/21/2016   Procedure: LEFT BREAST LUMPECTOMY WITH RADIOACTIVE SEED AND SENTINEL LYMPH NODE BIOPSY, INJECT BLUE DYE LEFT BREAST;  Surgeon: Fanny Skates, MD;  Location: [facility];  Service: General;  Laterality: Left;  . CARDIAC CATHETERIZATION     normal coroary arteries per patient  . CARDIOVASCULAR STRESS TEST     10/12/2013  . CESAREAN SECTION     X 3  . CHOLECYSTECTOMY    . COLONOSCOPY WITH PROPOFOL N/A 03/28/2015   Procedure: COLONOSCOPY WITH PROPOFOL;  Surgeon: Juanita Craver, MD;  Location: [facility];  Service: Endoscopy;  Laterality: N/A;  . HERNIA REPAIR  02/08/11   ventral hernia  . JOINT REPLACEMENT     bilateral  . KNEE ARTHROSCOPY  05/2010   bilateral  . LEFT AND RIGHT HEART CATHETERIZATION WITH CORONARY ANGIOGRAM N/A 11/08/2013   Procedure: LEFT AND RIGHT HEART CATHETERIZATION WITH CORONARY ANGIOGRAM;  Surgeon: [name], MD;  Location: [facility] CATH LAB;  Service: Cardiovascular;  Laterality: N/A;  . MENISCUS REPAIR  2009  . MOUTH SURGERY     teeth implants  . PORT-A-CATH REMOVAL N/A 01/24/2017   Procedure: REMOVAL PORT-A-CATH;  Surgeon: Fanny Skates, MD;  Location: [facility];  Service: General;  Laterality: N/A;  . PORTACATH PLACEMENT Right 07/16/2016   Procedure: INSERTION PORT-A-CATH RIGHT INTERNAL JUGULAR WITH ULTRASOUND;  Surgeon: Fanny Skates, MD;  Location: [facility];  Service: General;  Laterality: Right;  . TOTAL KNEE ARTHROPLASTY  2011   left  . TOTAL KNEE ARTHROPLASTY Right 05/10/2014   Procedure: RIGHT TOTAL KNEE ARTHROPLASTY;  Surgeon: Mauri Pole, MD;  Location: [facility];  Service: Orthopedics;   Laterality: Right;  . TUBAL LIGATION    . WISDOM TOOTH EXTRACTION  2000     ALLERGIES:  Allergies  Allergen Reactions  . No Known Allergies      CURRENT MEDICATIONS:  Outpatient Encounter Prescriptions as of 04/14/2017  Medication Sig  . acetaminophen (TYLENOL) 500 MG tablet Take 1,000 mg by mouth 2 (two) times daily as needed for moderate pain or headache.  . albuterol (PROVENTIL HFA;VENTOLIN HFA) 108 (90 Base) MCG/ACT inhaler Inhale 2 puffs into the lungs every 6 (six) hours as needed for wheezing or shortness of breath.  . anastrozole (ARIMIDEX) 1 MG tablet Take 1 tablet (1 mg total) by mouth daily.  Marland Kitchen aspirin EC 81 MG tablet Take 81 mg by mouth at bedtime.   Marland Kitchen BYSTOLIC 10 MG tablet TAKE 1 TABLET AT BEDTIME  . CARTIA XT 120 MG 24 hr capsule TAKE 1 CAPSULE EVERY DAY  .  diclofenac (VOLTAREN) 75 MG EC tablet Take 1 tablet (75 mg total) by mouth 2 (two) times daily.  Marland Kitchen escitalopram (LEXAPRO) 20 MG tablet Take 1 tablet (20 mg total) by mouth daily.  Marland Kitchen esomeprazole (NEXIUM) 40 MG capsule Take 40 mg by mouth daily.   . fluticasone furoate-vilanterol (BREO ELLIPTA) 100-25 MCG/INH AEPB Inhale 1 puff into the lungs daily.  . furosemide (LASIX) 20 MG tablet TAKE 1 TABLET EVERY DAY  . Hypromellose (ARTIFICIAL TEARS OP) Place 1 drop into both eyes daily as needed (dry eyes).   Marland Kitchen losartan (COZAAR) 100 MG tablet TAKE 1 TABLET EVERY DAY  . metFORMIN (GLUCOPHAGE) 500 MG tablet TAKE 1 TABLET TWICE DAILY WITH MEALS  . montelukast (SINGULAIR) 10 MG tablet TAKE 1 TABLET AT BEDTIME  . Probiotic Product (PROBIOTIC PO) Take 1 capsule by mouth at bedtime.  . rosuvastatin (CRESTOR) 40 MG tablet Take 1 tablet (40 mg total) by mouth daily.  . [DISCONTINUED] clotrimazole-betamethasone (LOTRISONE) cream Apply 1 application topically 2 (two) times daily as needed (for rash). (Patient not taking: Reported on 04/14/2017)  . [DISCONTINUED] rosuvastatin (CRESTOR) 20 MG tablet TAKE 1 TABLET AT BEDTIME (Patient not  taking: Reported on 04/14/2017)   No facility-administered encounter medications on file as of 04/14/2017.      ONCOLOGIC FAMILY HISTORY:  Family History  Problem Relation Age of Onset  . Thyroid disease Mother   . Hyperlipidemia Mother   . Heart attack Father 31  . Hypertension Father   . Arthritis Father        RA  . Coronary artery disease Father   . Coronary artery disease Brother   . Heart disease Brother   . Cancer Maternal Aunt        colon  . Cancer Maternal Grandmother        colon  . Heart attack Maternal Grandfather   . Alcohol abuse Paternal Grandfather      GENETIC COUNSELING/TESTING: Not at this time  SOCIAL HISTORY:  [name] is divorced and lives alone in [place], [place].  She has 3 children and they live in [place], [place] and [place].  [name] is currently retired.  She denies any current or history of tobacco, alcohol, or illicit drug use.     PHYSICAL EXAMINATION:  Vital Signs:   Vitals:   04/14/17 1340  BP: (!) 148/81  Pulse: 67  Resp: 18  Temp: 98.4 F (36.9 C)  SpO2: 95%   Filed Weights   04/14/17 1340  Weight: (!) 324 lb 4.8 oz (147.1 kg)   General: Well-nourished, well-appearing female in no acute distress.  She is unaccompanied today.   HEENT: Head is normocephalic.  Pupils equal and reactive to light. Conjunctivae clear without exudate.  Sclerae anicteric. Oral mucosa is pink, moist.  Oropharynx is pink without lesions or erythema.  Lymph: No cervical, supraclavicular, or infraclavicular lymphadenopathy noted on palpation.  Cardiovascular: Regular rate and rhythm.Marland Kitchen Respiratory: Clear to auscultation bilaterally. Chest expansion symmetric; breathing non-labored.  Breasts: pendulous.  Right breast without nodules, masses, skin changes, left breast lumpectomy site with mild amount of scar tissue present, healing well, radiation thickening and breast swelling noted, erythema noted, slight erythema noted at  previous skin lesion site, this is improving per patient.   GI: Abdomen soft and round; non-tender, non-distended. Bowel sounds normoactive.  GU: Deferred.  Neuro: No focal deficits. Steady gait.  Psych: Mood and affect normal and appropriate for situation.  Extremities: No edema. MSK: No focal spinal tenderness  to palpation.  Full range of motion in bilateral upper extremities Skin: Warm and dry.  LABORATORY DATA:  None for this visit.  DIAGNOSTIC IMAGING:  None for this visit.      ASSESSMENT AND PLAN:  Ms.. [name] is a pleasant 66 y.o. female with Stage IIA left breast invasive ductal carcinoma, ER+/PR-/HER2+, diagnosed in 04/2016, treated with lumpectomy, adjuvant chemotherapy, maintenance trastuzumab/Pertuzumab, adjuvant radiation therapy, and anti-estrogen therapy with Anastrozole beginning in 12/2016).  She presents to the Survivorship Clinic for our initial meeting and routine follow-up post-completion of treatment for breast cancer.    1. Stage IIA left breast cancer:  [name] is continuing to recover from definitive treatment for breast cancer. She will follow-up with her medical oncologist, Dr. Lindi Adie 09/2017 with history and physical exam per surveillance protocol.  She will continue her anti-estrogen therapy with Anastrozole. Thus far, she is tolerating the Anastrozole well, with minimal side effects. She was instructed to make Dr. Lindi Adie or myself aware if she begins to experience any worsening side effects of the medication and I could see her back in clinic to help manage those side effects, as needed.  Today, a comprehensive survivorship care plan and treatment summary was reviewed with the patient today detailing her breast cancer diagnosis, treatment course, potential late/long-term effects of treatment, appropriate follow-up care with recommendations for the future, and patient education resources.  A copy of this summary, along with a letter will be sent to the patient's  primary care provider via mail/fax/In Basket message after today's visit.    2. Breast swelling: Breast is swollen.  There is some slight erythema, but it is not warm, or particularly tender.  Due to this, I will refer her to PT to evaluated and see if there is a massage technique they can review with her to help it.    3. Bone health:  Given [name] age/history of breast cancer and her current treatment regimen including anti-estrogen therapy with Anastrozole, she is at risk for bone demineralization.  Her last DEXA scan was on 04/29/2016 and was consistent with osteopenia with a T score of -1.2 at her left femur. She was encouraged to increase her consumption of foods rich in calcium, as well as increase her weight-bearing activities.  She was given education on specific activities to promote bone health.  She should repeat bone density testing in 04/2018.  4. Cancer screening:  Due to [name]'s history and her age, she should receive screening for skin cancers, colon cancer, and gynecologic cancers.  The information and recommendations are listed on the patient's comprehensive care plan/treatment summary and were reviewed in detail with the patient.    5. Health maintenance and wellness promotion: [name] was encouraged to consume 5-7 servings of fruits and vegetables per day. We reviewed the "Nutrition Rainbow" handout, as well as the handout "Take Control of Your Health and Reduce Your Cancer Risk" from the [facility].  She was also encouraged to engage in moderate to vigorous exercise for 30 minutes per day most days of the week. We discussed the [employer] fitness program, which is designed for cancer survivors to help them become more physically fit after cancer treatments.  She was instructed to limit her alcohol consumption and continue to abstain from tobacco use.   6. Support services/counseling: It is not uncommon for this period of the patient's cancer care  trajectory to be one of many emotions and stressors.  We discussed an opportunity for her  to participate in the next session of [facility] ("Finding Your New Normal") support group series designed for patients after they have completed treatment.   [name] was encouraged to take advantage of our many other support services programs, support groups, and/or counseling in coping with her new life as a cancer survivor after completing anti-cancer treatment.  She was offered support today through active listening and expressive supportive counseling.  She was given information regarding our available services and encouraged to contact me with any questions or for help enrolling in any of our support group/programs.    Dispo:   -Return to cancer center for follow up with Dr. Lindi Adie in 09/2017 -Mammogram due in 04/2017 -Bone density 04/2018 -Follow up with Dr. Dalbert Batman in 05/2017 -She is welcome to return back to the Survivorship Clinic at any time; no additional follow-up needed at this time.  -Consider referral back to survivorship as a long-term survivor for continued surveillance  A total of (30) minutes of face-to-face time was spent with this patient with greater than 50% of that time in counseling and care-coordination.   Gardenia Phlegm, NP Survivorship Program [place] [phone]   Note: PRIMARY CARE PROVIDER [name], [place] [phone]

## 2017-04-14 NOTE — Progress Notes (Signed)
Subjective:   [name] is a 67 y.o. female who presents for an Initial Medicare Annual Wellness Visit.  Review of Systems    No ROS.  Medicare Wellness Visit. Additional risk factors are reflected in the social history. Cardiac Risk Factors include: advanced age (>63men, >53 women);diabetes mellitus;dyslipidemia;hypertension;sedentary lifestyle Sleep patterns:    Female:   Pap- pt states she will schedule appt with Dr.Tomlin       Mammo-  Scheduled for 05/01/17     Dexa scan- last 04/29/16: osteopenia        CCS- last 03/28/15: recall 7 years     Objective:    Today's Vitals   04/22/17 0915  BP: (!) 154/83  Pulse: 66  SpO2: 99%  Weight: (!) 324 lb 12.8 oz (147.3 kg)  Height: 5\' 6"  (1.676 m)   Body mass index is 52.42 kg/m.   Current Medications (verified) Outpatient Encounter Prescriptions as of 04/22/2017  Medication Sig  . acetaminophen (TYLENOL) 500 MG tablet Take 1,000 mg by mouth 2 (two) times daily as needed for moderate pain or headache.  . albuterol (PROVENTIL HFA;VENTOLIN HFA) 108 (90 Base) MCG/ACT inhaler Inhale 2 puffs into the lungs every 6 (six) hours as needed for wheezing or shortness of breath.  Marland Kitchen amoxicillin (AMOXIL) 500 MG tablet Take 1 tablet (500 mg total) by mouth 2 (two) times daily.  Marland Kitchen anastrozole (ARIMIDEX) 1 MG tablet Take 1 tablet (1 mg total) by mouth daily.  Marland Kitchen aspirin EC 81 MG tablet Take 81 mg by mouth at bedtime.   Marland Kitchen BYSTOLIC 10 MG tablet TAKE 1 TABLET AT BEDTIME  . CARTIA XT 120 MG 24 hr capsule TAKE 1 CAPSULE EVERY DAY  . diclofenac (VOLTAREN) 75 MG EC tablet Take 1 tablet (75 mg total) by mouth 2 (two) times daily.  Marland Kitchen escitalopram (LEXAPRO) 20 MG tablet Take 1 tablet (20 mg total) by mouth daily.  Marland Kitchen esomeprazole (NEXIUM) 40 MG capsule Take 40 mg by mouth daily.   Marland Kitchen ezetimibe (ZETIA) 10 MG tablet Take 1 tablet (10 mg total) by mouth daily.  . fluticasone furoate-vilanterol (BREO ELLIPTA) 100-25 MCG/INH AEPB Inhale 1 puff into the  lungs daily.  . furosemide (LASIX) 20 MG tablet TAKE 1 TABLET EVERY DAY  . Hypromellose (ARTIFICIAL TEARS OP) Place 1 drop into both eyes daily as needed (dry eyes).   Marland Kitchen losartan (COZAAR) 100 MG tablet TAKE 1 TABLET EVERY DAY  . metFORMIN (GLUCOPHAGE) 500 MG tablet TAKE 1 TABLET TWICE DAILY WITH MEALS  . montelukast (SINGULAIR) 10 MG tablet TAKE 1 TABLET AT BEDTIME  . Probiotic Product (PROBIOTIC PO) Take 1 capsule by mouth at bedtime.  . rosuvastatin (CRESTOR) 40 MG tablet Take 1 tablet (40 mg total) by mouth daily.  . [DISCONTINUED] albuterol (PROVENTIL HFA;VENTOLIN HFA) 108 (90 Base) MCG/ACT inhaler Inhale 2 puffs into the lungs every 6 (six) hours as needed for wheezing or shortness of breath.  . [DISCONTINUED] rosuvastatin (CRESTOR) 40 MG tablet Take 1 tablet (40 mg total) by mouth daily.   No facility-administered encounter medications on file as of 04/22/2017.     Allergies (verified) No known allergies   History: Past Medical History:  Diagnosis Date  . Anemia 04/05/2012  . Anxiety   . Anxiety state 09/11/2007   Qualifier: Diagnosis of  By: Scherrie Gerlach    . Atrial tachycardia, paroxysmal ([place])   . Back pain 10/02/2014  . Breast cancer ([place]) 02/17/2017  . Chicken pox as a child  .  Chronic diastolic CHF (congestive heart failure), NYHA class 1 ([place])   . Complication of anesthesia    pt states feels different in her body after anesthesia when waking up and also experiences a smell of burnt plastic for approx a wk   . Dilated aortic root ([place])    1mm by echo 09/2016  . Dysuria 02/17/2017  . Elevated LFTs 04/05/2012  . GERD (gastroesophageal reflux disease)   . Goiter   . Heart murmur    hx of one at birth   . History of kidney stones   . History of radiation therapy 10/31/16-12/17/16   left breast 45 Gy in 25 fractions, left breast boost 16 Gy in 8 fractions  . Hx of colonic polyps   . Hyperlipidemia   . Hypertension   . Insomnia 10/08/2016  . Malignant neoplasm of  upper-outer quadrant of left female breast ([place]) 05/14/2016   not with patient  . Measles as a child  . Mumps as a child  . OA (osteoarthritis) of knee 04/05/2012  . Obesity 07/11/2014  . PONV (postoperative nausea and vomiting)   . Preventative health care 09/05/2013  . PVC's (premature ventricular contractions) 05/02/2016  . Shortness of breath dyspnea    walking distances / climbing stairs  . Sleep apnea 04/05/2012  . Tinea corporis 02/23/2013  . Type 2 diabetes mellitus with hyperglycemia ([place]) 12/31/2013  . Vasomotor rhinitis 04/05/2012  . Ventral hernia    Past Surgical History:  Procedure Laterality Date  . BREAST LUMPECTOMY WITH RADIOACTIVE SEED AND SENTINEL LYMPH NODE BIOPSY Left 06/21/2016   Procedure: LEFT BREAST LUMPECTOMY WITH RADIOACTIVE SEED AND SENTINEL LYMPH NODE BIOPSY, INJECT BLUE DYE LEFT BREAST;  Surgeon: Fanny Skates, MD;  Location: [facility];  Service: General;  Laterality: Left;  . CARDIAC CATHETERIZATION     normal coroary arteries per patient  . CARDIOVASCULAR STRESS TEST     10/12/2013  . CESAREAN SECTION     X 3  . CHOLECYSTECTOMY    . COLONOSCOPY WITH PROPOFOL N/A 03/28/2015   Procedure: COLONOSCOPY WITH PROPOFOL;  Surgeon: Juanita Craver, MD;  Location: [facility];  Service: Endoscopy;  Laterality: N/A;  . HERNIA REPAIR  02/08/11   ventral hernia  . JOINT REPLACEMENT     bilateral  . KNEE ARTHROSCOPY  05/2010   bilateral  . LEFT AND RIGHT HEART CATHETERIZATION WITH CORONARY ANGIOGRAM N/A 11/08/2013   Procedure: LEFT AND RIGHT HEART CATHETERIZATION WITH CORONARY ANGIOGRAM;  Surgeon: [name], MD;  Location: [facility] CATH LAB;  Service: Cardiovascular;  Laterality: N/A;  . MENISCUS REPAIR  2009  . MOUTH SURGERY     teeth implants  . PORT-A-CATH REMOVAL N/A 01/24/2017   Procedure: REMOVAL PORT-A-CATH;  Surgeon: Fanny Skates, MD;  Location: [facility];  Service: General;  Laterality: N/A;  . PORTACATH PLACEMENT Right 07/16/2016   Procedure: INSERTION  PORT-A-CATH RIGHT INTERNAL JUGULAR WITH ULTRASOUND;  Surgeon: Fanny Skates, MD;  Location: [facility];  Service: General;  Laterality: Right;  . TOTAL KNEE ARTHROPLASTY  2011   left  . TOTAL KNEE ARTHROPLASTY Right 05/10/2014   Procedure: RIGHT TOTAL KNEE ARTHROPLASTY;  Surgeon: Mauri Pole, MD;  Location: [facility];  Service: Orthopedics;  Laterality: Right;  . TUBAL LIGATION    . WISDOM TOOTH EXTRACTION  2000   Family History  Problem Relation Age of Onset  . Thyroid disease Mother   . Hyperlipidemia Mother   . Heart attack Father 64  . Hypertension Father   . Arthritis  Father        RA  . Coronary artery disease Father   . Coronary artery disease Brother   . Heart disease Brother   . Cancer Maternal Aunt        colon  . Cancer Maternal Grandmother        colon  . Heart attack Maternal Grandfather   . Alcohol abuse Paternal Grandfather    Social History   Occupational History  . retired -     Social History Main Topics  . Smoking status: Never Smoker  . Smokeless tobacco: Never Used  . Alcohol use Yes     Comment: rarely  . Drug use: No  . Sexual activity: No     Comment: lives with mother currently-stress    Tobacco Counseling Counseling given: Not Answered   Activities of Daily Living In your present state of health, do you have any difficulty performing the following activities: 04/22/2017 01/22/2017  Hearing? N Y  Comment - age related hearing loss  Vision? N N  Comment wearing glasses. eye doctor yearly.  -  Difficulty concentrating or making decisions? N N  Walking or climbing stairs? N Y  Comment bilateral knee issues. hx of sx shortness of breath since beginning cancer treatment  Dressing or bathing? N N  Doing errands, shopping? N -  Preparing Food and eating ? N -  Using the Toilet? N -  In the past six months, have you accidently leaked urine? Y -  Do you have problems with loss of bowel control? N -  Managing your Medications? N -    Managing your Finances? N -  Housekeeping or managing your Housekeeping? N -  Some recent data might be hidden    Immunizations and Health Maintenance Immunization History  Administered Date(s) Administered  . Influenza Split 03/24/2012, 03/24/2013  . Influenza, High Dose Seasonal PF 04/18/2016, 04/21/2017  . Influenza-Unspecified 03/21/2014, 03/15/2015, 03/18/2015  . Pneumococcal Conjugate-13 04/18/2016  . Tdap 06/24/2008  . Zoster 04/19/2011   Health Maintenance Due  Topic Date Due  . FOOT EXAM  11/21/1960  . OPHTHALMOLOGY EXAM  11/21/1960  . PNA vac Low Risk Adult (2 of 2 - PPSV23) 04/18/2017    Patient Care Team: [name], MD as PCP - General (Family Medicine) Fanny Skates, MD as Consulting Physician (General Surgery) Nicholas Lose, MD as Consulting Physician (Hematology and Oncology) Gery Pray, MD as Consulting Physician (Radiation Oncology) [name], NP as Nurse Practitioner (Hematology and Oncology)  Indicate any recent [place] you may have received from other than [facility] providers in the past year (date may be approximate).     Assessment:   This is a routine wellness examination for [name]. Physical assessment deferred to PCP.  Hearing/Vision screen  Hearing Screening   125Hz  250Hz  500Hz  1000Hz  2000Hz  3000Hz  4000Hz  6000Hz  8000Hz   Right ear:   Pass Pass Pass  Pass    Left ear:   Pass Pass Pass  Pass    Vision Screening Comments: Pt states she had eye doctor appt last month-Dr.Oman  Dietary issues and exercise activities discussed: Current Exercise Habits: The patient does not participate in regular exercise at present, Exercise limited by: None identified Diet (meal preparation, eat out, water intake, caffeinated beverages, dairy products, fruits and vegetables): in general, an "unhealthy" diet. Diabetic diet education discussed and handouts given.    Goals    . Increase physical activity (pt-stated)          Restart  Silver Sneakers.    . Weight (lb) < 300 lb (136.1 kg) (pt-stated)          With diet       Depression Screen PHQ 2/9 Scores 04/22/2017 01/20/2017 10/23/2016 07/03/2016 10/13/2014  PHQ - 2 Score 0 0 0 0 2    Fall Risk Fall Risk  04/22/2017 01/20/2017 10/23/2016 07/03/2016 10/13/2014  Falls in the past year? No No No No No    Cognitive Function: MMSE - Mini Mental State Exam 04/22/2017  Orientation to time 5  Orientation to Place 5  Registration 3  Attention/ Calculation 5  Recall 2  Language- name 2 objects 2  Language- repeat 1  Language- follow 3 step command 3  Language- read & follow direction 1  Write a sentence 1  Copy design 1  Total score 29        Screening Tests Health Maintenance  Topic Date Due  . FOOT EXAM  11/21/1960  . OPHTHALMOLOGY EXAM  11[birth year]21/1960  . PNA vac Low Risk Adult (2 of 2 - PPSV23) 04/18/2017  . HEMOGLOBIN A1C  08/20/2017  . MAMMOGRAM  04/29/2018  . TETANUS/TDAP  06/24/2018  . COLONOSCOPY  03/27/2025  . INFLUENZA VACCINE  Completed  . DEXA SCAN  Completed  . Hepatitis C Screening  Completed      Plan:   Follow up with PCP as directed  Continue to eat heart healthy diet (full of fruits, vegetables, whole grains, lean protein, water--limit salt, fat, and sugar intake) and increase physical activity as tolerated.  Continue doing brain stimulating activities (puzzles, reading, adult coloring books, staying active) to keep memory sharp.   Bring a copy of your living will and/or healthcare power of attorney to your next office visit.    I have personally reviewed and noted the following in the patient's chart:   . Medical and social history . Use of alcohol, tobacco or illicit drugs  . Current medications and supplements . Functional ability and status . Nutritional status . Physical activity . Advanced directives . List of other physicians . Hospitalizations, surgeries, and ER visits in previous 12 months . Vitals . Screenings to  include cognitive, depression, and falls . Referrals and appointments  In addition, I have reviewed and discussed with patient certain preventive protocols, quality metrics, and best practice recommendations. A written personalized care plan for preventive services as well as general preventive health recommendations were provided to patient.     Shela Nevin, [place]   04/22/2017

## 2017-04-15 ENCOUNTER — Encounter: Payer: Self-pay | Admitting: Cardiology

## 2017-04-15 ENCOUNTER — Ambulatory Visit (INDEPENDENT_AMBULATORY_CARE_PROVIDER_SITE_OTHER): Payer: Medicare HMO | Admitting: Cardiology

## 2017-04-15 VITALS — BP 144/94 | Ht 65.5 in | Wt 326.8 lb

## 2017-04-15 DIAGNOSIS — I10 Essential (primary) hypertension: Secondary | ICD-10-CM | POA: Diagnosis not present

## 2017-04-15 DIAGNOSIS — E7800 Pure hypercholesterolemia, unspecified: Secondary | ICD-10-CM

## 2017-04-15 DIAGNOSIS — I5032 Chronic diastolic (congestive) heart failure: Secondary | ICD-10-CM

## 2017-04-15 DIAGNOSIS — I77810 Thoracic aortic ectasia: Secondary | ICD-10-CM | POA: Diagnosis not present

## 2017-04-15 DIAGNOSIS — I471 Supraventricular tachycardia: Secondary | ICD-10-CM | POA: Diagnosis not present

## 2017-04-15 DIAGNOSIS — I2510 Atherosclerotic heart disease of native coronary artery without angina pectoris: Secondary | ICD-10-CM | POA: Diagnosis not present

## 2017-04-15 MED ORDER — EZETIMIBE 10 MG PO TABS: 10.0000 mg | ORAL_TABLET | Freq: Every day | ORAL | 3 refills | Status: DC

## 2017-04-15 MED ORDER — EZETIMIBE 10 MG PO TABS: 10.0000 mg | ORAL_TABLET | Freq: Every day | ORAL | 0 refills | Status: DC

## 2017-04-15 MED ORDER — ROSUVASTATIN CALCIUM 40 MG PO TABS: 40.0000 mg | ORAL_TABLET | Freq: Every day | ORAL | 3 refills | Status: DC

## 2017-04-15 NOTE — Patient Instructions (Signed)
Medication Instructions:  Your physician has recommended you make the following change in your medication:   START: Zetia 10 mg daily   Labwork: Your physician recommends that you return for lab work in 8 weeks for fasting lipids and liver panel    Testing/Procedures: None ordered   Follow-Up: Your physician wants you to follow-up in: 6 months with Dr. [name]. You will receive a reminder letter in the mail two months in advance. If you don't receive a letter, please call our office to schedule the follow-up appointment.   Any Other Special Instructions Will Be Listed Below (If Applicable).     If you need a refill on your cardiac medications before your next appointment, please call your pharmacy.

## 2017-04-17 ENCOUNTER — Ambulatory Visit: Payer: Medicare HMO | Attending: Adult Health | Admitting: Physical Therapy

## 2017-04-17 DIAGNOSIS — M25512 Pain in left shoulder: Secondary | ICD-10-CM | POA: Insufficient documentation

## 2017-04-17 DIAGNOSIS — R293 Abnormal posture: Secondary | ICD-10-CM | POA: Diagnosis not present

## 2017-04-17 DIAGNOSIS — I890 Lymphedema, not elsewhere classified: Secondary | ICD-10-CM

## 2017-04-17 NOTE — Patient Instructions (Signed)
First of all, check with your insurance company to see if provider is in network    [facility]   (for wigs and compression sleeves / gloves/gauntlets )  [address] [place], [place] [address] [phone]  Will file some insurances --- call for appointment   [facility] (for mastectomy prosthetics and garments) [address] [place], [place] [address] [phone] Will file some insurances --- call for appointment  [facility]  [address]  [place], [place] [address] [phone] Lower extremity garments  [facility] [address] [place], [place]  [address] [phone]  [employer] Sales rep:  Matt Lawson:  [phone] [email or web address] Biocompression pumps   [facility]  Sales rep: Robert Rollins:  [phone] [email or web address] Entre and Flexitouch pumps    Other Resources: National Lymphedema Network:  www.lymphnet.org [email or web address] for patient articles and purchase a self manual lymph drainage DVD [email or web address] has informative articles.  

## 2017-04-17 NOTE — Therapy (Signed)
[place], [place], [address] Phone: [phone]   Fax:  [phone]  Physical Therapy Evaluation  Patient Details  Name: [name] MRN: [id number] Date of Birth: 1950/08/04 Referring Provider: Wilber Bihari   Encounter Date: 04/17/2017      PT End of Session - 04/17/17 1447    Visit Number 1   Number of Visits 9   Date for PT Re-Evaluation 05/23/17   PT Start Time 6160   PT Stop Time 1437   PT Time Calculation (min) 52 min   Activity Tolerance Patient tolerated treatment well   Behavior During Therapy [facility] for tasks assessed/performed      Past Medical History:  Diagnosis Date  . Anemia 04/05/2012  . Anxiety   . Anxiety state 09/11/2007   Qualifier: Diagnosis of  By: Scherrie Gerlach    . Atrial tachycardia, paroxysmal ([place])   . Back pain 10/02/2014  . Breast cancer ([place]) 02/17/2017  . Chicken pox as a child  . Chronic diastolic CHF (congestive heart failure), NYHA class 1 ([place])   . Complication of anesthesia    pt states feels different in her body after anesthesia when waking up and also experiences a smell of burnt plastic for approx a wk   . Dilated aortic root ([place])    67mm by echo 09/2016  . Dysuria 02/17/2017  . Elevated LFTs 04/05/2012  . GERD (gastroesophageal reflux disease)   . Goiter   . Heart murmur    hx of one at birth   . History of kidney stones   . History of radiation therapy 10/31/16-12/17/16   left breast 45 Gy in 25 fractions, left breast boost 16 Gy in 8 fractions  . Hx of colonic polyps   . Hyperlipidemia   . Hypertension   . Insomnia 10/08/2016  . Malignant neoplasm of upper-outer quadrant of left female breast ([place]) 05/14/2016   not with patient  . Measles as a child  . Mumps as a child  . OA (osteoarthritis) of knee 04/05/2012  . Obesity 07/11/2014  . PONV (postoperative nausea and vomiting)   . Preventative health care 09/05/2013  . PVC's (premature  ventricular contractions) 05/02/2016  . Shortness of breath dyspnea    walking distances / climbing stairs  . Sleep apnea 04/05/2012  . Tinea corporis 02/23/2013  . Type 2 diabetes mellitus with hyperglycemia ([place]) 12/31/2013  . Vasomotor rhinitis 04/05/2012  . Ventral hernia     Past Surgical History:  Procedure Laterality Date  . BREAST LUMPECTOMY WITH RADIOACTIVE SEED AND SENTINEL LYMPH NODE BIOPSY Left 06/21/2016   Procedure: LEFT BREAST LUMPECTOMY WITH RADIOACTIVE SEED AND SENTINEL LYMPH NODE BIOPSY, INJECT BLUE DYE LEFT BREAST;  Surgeon: Fanny Skates, MD;  Location: [facility];  Service: General;  Laterality: Left;  . CARDIAC CATHETERIZATION     normal coroary arteries per patient  . CARDIOVASCULAR STRESS TEST     10/12/2013  . CESAREAN SECTION     X 3  . CHOLECYSTECTOMY    . COLONOSCOPY WITH PROPOFOL N/A 03/28/2015   Procedure: COLONOSCOPY WITH PROPOFOL;  Surgeon: Juanita Craver, MD;  Location: [facility];  Service: Endoscopy;  Laterality: N/A;  . HERNIA REPAIR  02/08/11   ventral hernia  . JOINT REPLACEMENT     bilateral  . KNEE ARTHROSCOPY  05/2010   bilateral  . LEFT AND RIGHT HEART CATHETERIZATION WITH CORONARY ANGIOGRAM N/A 11/08/2013   Procedure: LEFT AND RIGHT HEART CATHETERIZATION WITH CORONARY ANGIOGRAM;  Surgeon: [name], MD;  Location: [facility] CATH LAB;  Service: Cardiovascular;  Laterality: N/A;  . MENISCUS REPAIR  2009  . MOUTH SURGERY     teeth implants  . PORT-A-CATH REMOVAL N/A 01/24/2017   Procedure: REMOVAL PORT-A-CATH;  Surgeon: Fanny Skates, MD;  Location: [facility];  Service: General;  Laterality: N/A;  . PORTACATH PLACEMENT Right 07/16/2016   Procedure: INSERTION PORT-A-CATH RIGHT INTERNAL JUGULAR WITH ULTRASOUND;  Surgeon: Fanny Skates, MD;  Location: [facility];  Service: General;  Laterality: Right;  . TOTAL KNEE ARTHROPLASTY  2011   left  . TOTAL KNEE ARTHROPLASTY Right 05/10/2014   Procedure: RIGHT TOTAL KNEE ARTHROPLASTY;  Surgeon: Mauri Pole,  MD;  Location: [facility];  Service: Orthopedics;  Laterality: Right;  . TUBAL LIGATION    . WISDOM TOOTH EXTRACTION  2000    There were no vitals filed for this visit.       Subjective Assessment - 04/17/17 1341    Subjective The problem I have right now is a hard ridge in the left breast with swelling  and pain Pt wants to get this under control before she has her Mammogram on Nov. 8   Pertinent History left breast cancer with left lumpectomy 06/21/2016 with 1/5 lymph nodes positive, chemo from 2/1-2/19/ 2018, Radiation from 5/10- 6/7/ 2018, currently on anastrazole    Patient Stated Goals to get rid of the hard ridge in my left breast             [facility] PT Assessment - 04/17/17 0001      Assessment   Medical Diagnosis left breast cancer    Referring Provider Wilber Bihari    Onset Date/Surgical Date 07/18/16   Hand Dominance Right     Precautions   Precautions Other (comment)  previous chemo with pulmonary issues , radiation to left bre     Restrictions   Weight Bearing Restrictions No     Balance Screen   Has the patient fallen in the past 6 months No   Has the patient had a decrease in activity level because of a fear of falling?  No  had decreased activity due to pulmonary problems.    Is the patient reluctant to leave their home because of a fear of falling?  No     Home Environment   Living Environment Private residence   Living Arrangements Alone   Available Help at Discharge Family;Available PRN/intermittently     Prior Function   Level of Independence Independent   Vocation Retired   Leisure pt hasn't been able to exercise due to pulmonary issues, loves to go to church and sings in the choir  loves to be involved with grandchildren      Cognition   Overall Cognitive Status Within Functional Limits for tasks assessed     Observation/Other Assessments   Observations fullness in left breast with large pores and firmness in medial protion that is tender.   circular area at lateral breat that is reddened    Skin Integrity well healed scar at midchest where port was, left lateral breast and left axilla    Quick DASH  52.27     Sensation   Light Touch Not tested     Coordination   Gross Motor Movements are Fluid and Coordinated Yes     Posture/Postural Control   Posture/Postural Control Postural limitations   Postural Limitations Rounded Shoulders;Forward head;Increased lumbar lordosis;Decreased thoracic kyphosis   Posture Comments pt obese  ROM / Strength   AROM / PROM / Strength AROM     AROM   Overall AROM Comments pt able to raise arms over head without difficulty but reports some pain in left shoulder    Right Shoulder Flexion 160 Degrees   Right Shoulder ABduction 170 Degrees   Left Shoulder Flexion 150 Degrees   Left Shoulder ABduction 150 Degrees              LYMPHEDEMA/ONCOLOGY QUESTIONNAIRE - 04/17/17 1415      Right Upper Extremity Lymphedema   10 cm Proximal to Olecranon Process 40 cm   Olecranon Process 34 cm   15 cm Proximal to Ulnar Styloid Process 34 cm   Just Proximal to Ulnar Styloid Process 24 cm   Across Hand at [employer] 24 cm   At [place] of 2nd Digit 7.2 cm     Left Upper Extremity Lymphedema   10 cm Proximal to Olecranon Process 40 cm   Olecranon Process 34 cm   15 cm Proximal to Ulnar Styloid Process 35 cm   Just Proximal to Ulnar Styloid Process 24 cm   Across Hand at [employer] 24.5 cm   At [place] of 2nd Digit 7 cm           Quick Dash - 04/17/17 0001    Open a tight or new jar Moderate difficulty   Do heavy household chores (wash walls, wash floors) Moderate difficulty   Carry a shopping bag or briefcase Moderate difficulty   Wash your back Moderate difficulty   Use a knife to cut food Moderate difficulty   Recreational activities in which you take some force or impact through your arm, shoulder, or hand (golf, hammering, tennis) Severe difficulty   During the past week, to  what extent has your arm, shoulder or hand problem interfered with your normal social activities with family, friends, neighbors, or groups? Modererately   During the past week, to what extent has your arm, shoulder or hand problem limited your work or other regular daily activities Modererately   Arm, shoulder, or hand pain. Moderate   Tingling (pins and needles) in your arm, shoulder, or hand Moderate   Difficulty Sleeping Moderate difficulty   DASH Score 52.27 %      Objective measurements completed on examination: See above findings.          [facility] Adult PT Treatment/Exercise - 04/17/17 0001      Self-Care   Self-Care Other Self-Care Comments   Other Self-Care Comments  showed pt options for compression bras and swell spots  issued foam patch for inside bra to wear at full spot                 PT Education - 04/17/17 1446    Education provided Yes   Education Details where to get compression bras    Person(s) Educated Patient   Methods Explanation   Comprehension Verbalized understanding                     Plan - 04/17/17 1447    Clinical Impression Statement 66 yo female s/p left lumpectomy, chemo and radition who has tender fullness in left breast.    History and Personal Factors relevant to plan of care: as above, and lives alone. long drive to get to our clinic    Clinical Presentation Stable   Clinical Decision Making Low   Rehab Potential Good   Clinical Impairments Affecting  Rehab Potential previous radiation and 5 nodes removed    PT Frequency 2x / week   PT Duration 4 weeks   PT Treatment/Interventions ADLs/Self Care Home Management;Patient/family education;Orthotic Fit/Training;Taping;Manual lymph drainage;Compression bandaging;Scar mobilization;Passive range of motion;Therapeutic exercise;Manual techniques   PT Next Visit Plan perform and teach manual lymph drainage, begin dowel rod exercise for shoulder range of motion, assess foam patch  later, progress shoulder strengthening.     Recommended Other Services ??Pulmonary Rehab ?? Pt will talk with pulmonogist on Monday    Consulted and Agree with Plan of Care Patient      Patient will benefit from skilled therapeutic intervention in order to improve the following deficits and impairments:  Decreased skin integrity, Obesity, Increased edema, Decreased scar mobility, Decreased knowledge of precautions, Decreased knowledge of use of DME, Increased fascial restricitons, Pain, Decreased range of motion, Postural dysfunction  Visit Diagnosis: Abnormal posture - Plan: PT plan of care cert/re-cert  Lymphedema, not elsewhere classified - Plan: PT plan of care cert/re-cert  Acute pain of left shoulder - Plan: PT plan of care cert/re-cert      G-Codes - 43/15/40 1453    Functional Assessment Tool Used (Outpatient Only) Quick DASH    Functional Limitation Carrying, moving and handling objects   Carrying, Moving and Handling Objects Current Status (G8676) At least 40 percent but less than 60 percent impaired, limited or restricted   Carrying, Moving and Handling Objects Goal Status (P9509) At least 20 percent but less than 40 percent impaired, limited or restricted       Problem List Patient Active Problem List   Diagnosis Date Noted  . Breast cancer ([place]) 02/17/2017  . Dysuria 02/17/2017  . Insomnia 10/08/2016  . Dilated aortic root ([place])   . Skin rash 08/08/2016  . Port catheter in place 08/01/2016  . Malignant neoplasm of upper-outer quadrant of left female breast ([place]) 05/14/2016  . PVC's (premature ventricular contractions) 05/02/2016  . Neck mass 04/21/2016  . LVH (left ventricular hypertrophy) due to hypertensive disease, with heart failure ([place]) 04/18/2016  . Tonsillar hypertrophy 07/07/2015  . Back pain 10/02/2014  . Obesity 07/11/2014  . S/P right TKA 05/10/2014  . S/P knee replacement 05/10/2014  . Atrial tachycardia, paroxysmal ([place]) 02/22/2014  . Type 2  diabetes mellitus with hyperglycemia ([place]) 12/31/2013  . Acute sinusitis with symptoms > 10 days 12/20/2013  . Chronic diastolic CHF (congestive heart failure) ([place]) 11/22/2013  . CAD (coronary artery disease), native coronary artery 11/22/2013  . Dyspnea on exertion 11/03/2013  . Undiagnosed cardiac murmurs 09/05/2013  . Preventative health care 09/05/2013  . Tinea corporis 02/23/2013  . Musculoskeletal pain 11/10/2012  . Vasomotor rhinitis 04/05/2012  . Obstructive sleep apnea 04/05/2012  . Anemia 04/05/2012  . Elevated LFTs 04/05/2012  . OA (osteoarthritis) of knee 04/05/2012  . Hernia 10/13/2010  . Hyperlipidemia 09/11/2007  . Anxiety and depression 09/11/2007  . Essential hypertension 09/11/2007  . GERD 09/11/2007  . RENAL CALCULUS 09/11/2007  . RENAL CYST 09/11/2007  . COLONIC POLYPS, HX OF 09/11/2007   Donato Heinz. Owens Shark PT  Norwood Levo 04/17/2017, 2:56 PM  [place], [place], [address] Phone: [phone]   Fax:  [phone]  Name: [name] MRN: [id number] Date of Birth: [birth year]

## 2017-04-21 ENCOUNTER — Ambulatory Visit (INDEPENDENT_AMBULATORY_CARE_PROVIDER_SITE_OTHER): Payer: Medicare HMO | Admitting: Internal Medicine

## 2017-04-21 ENCOUNTER — Telehealth: Payer: Self-pay | Admitting: *Deleted

## 2017-04-21 ENCOUNTER — Encounter: Payer: Self-pay | Admitting: Internal Medicine

## 2017-04-21 DIAGNOSIS — Z23 Encounter for immunization: Secondary | ICD-10-CM | POA: Diagnosis not present

## 2017-04-21 DIAGNOSIS — G4733 Obstructive sleep apnea (adult) (pediatric): Secondary | ICD-10-CM

## 2017-04-21 DIAGNOSIS — J4520 Mild intermittent asthma, uncomplicated: Secondary | ICD-10-CM | POA: Diagnosis not present

## 2017-04-21 DIAGNOSIS — J019 Acute sinusitis, unspecified: Secondary | ICD-10-CM

## 2017-04-21 MED ORDER — AMOXICILLIN 500 MG PO TABS: 500.0000 mg | ORAL_TABLET | Freq: Two times a day (BID) | ORAL | 0 refills | Status: DC

## 2017-04-21 MED ORDER — ALBUTEROL SULFATE HFA 108 (90 BASE) MCG/ACT IN AERS: 2.0000 | INHALATION_SPRAY | Freq: Four times a day (QID) | RESPIRATORY_TRACT | 12 refills | Status: DC | PRN

## 2017-04-21 NOTE — Telephone Encounter (Signed)
Can you call patient and figure out where we need to send these?  Thanks, L

## 2017-04-21 NOTE — Telephone Encounter (Signed)
Orders were faxed to cancer rehab today.

## 2017-04-21 NOTE — Patient Instructions (Signed)
Script sent refilling albuterol rescue inhaler  Script sent for amoxacillin for sinus infection  You might want to get a neti pot or nasal rinse bottle, like NeilMed or similar  Continue the Breo Continue the CPAP- glad it helps

## 2017-04-21 NOTE — Progress Notes (Signed)
HPI female never smoker followed for OSA, lung restriction, dyspnea on exertion, asthmatic bronchitis, complicated by chronic sinusitis, anxiety and depression, paroxysmal atrial tachycardia, CAD, breast cancer left, morbid obesity Unattended Home Sleep Test 11/05/13-AHI 68.1/hour, desaturation to 62%, body weight 320 pounds Echocardiogram 12/04/16- Nl EF Office Spirometry 02/06/17-moderate restriction of exhaled volume. FVC 2.02/61%, FEV1 1.75/69%, ratio 0.86, FEF 25-75% 2.45/113% PFT- 03/12/17-moderate obstructive airways disease with significant response to bronchodilator, normal diffusion. FVC 2.25/69%, FEV1 1.88/76%, ratio 0.84, TLC 80%, DLCO 86% --------------------------------------------------------------------------------------------  03/10/17- 66 year old female never smoker followed for OSA, lung restriction, dyspnea on exertion, asthmatic bronchitis, complicated by chronic sinusitis, anxiety and depression, paroxysmal atrial tachycardia, CAD, breast cancer left, morbid obesity CPAP auto 5-15/[facility] We had given sample of Breo 100 to try instead of Flovent or Qvar. FOLLOWS FOR: DME: [facility]. Pt wears CPAP nightly and naps;pt states auto settings work well for her and DL attached. Pt did not hear from [facility] regarding burn smell in CPAP machine.  Download documents great compliance-100% with AHI 0.6/hour. Pressures feel just fine to her. Breo does help. Still more short of breath than before chemotherapy. Cardiology has evaluated.  04/21/17-- 66 year old female never smoker followed for OSA, lung restriction, dyspnea on exertion, asthmatic bronchitis, complicated by chronic sinusitis, anxiety and depression, paroxysmal atrial tachycardia, CAD, breast cancer left, morbid obesity, goiter CPAP auto 5-15/[facility] Main concern has been dyspnea. We had changed from Flovent to [place]. DOE-Pt had PFT 02-2017-go over in greater detail. Pt has drainage in throat today that is causing a cough. Otherwise feels  that her breathing is better.  Singulair, Breo 100- Eyes have been watery, retro-orbital facial pain, right frontal headache, increase sinus drainage turning green. History of recurrent sinusitis in the past. "Junk in throat". No fever. Memory Dance has kept her breathing much better. She has albuterol inhaler rarely needed. She is quite satisfied with ongoing CPAP use-no download available today. PFT- 03/12/17-moderate obstructive airways disease with significant response to bronchodilator, normal diffusion. FVC 2.25/69%, FEV1 1.88/76%, ratio 0.84, TLC 80%, DLCO 86%  ROS-see HPI   + = Positive Constitutional:    weight loss, night sweats, fevers, chills, fatigue, lassitude. HEENT:   + headaches, difficulty swallowing, tooth/dental problems, sore throat,       sneezing, itching, ear ache, + nasal congestion, post nasal drip, snoring CV:    chest pain, orthopnea, PND, swelling in lower extremities, anasarca,                                                  dizziness, palpitations Resp:   + shortness of breath with exertion or at rest.                productive cough,   non-productive cough, coughing up of blood.              change in color of mucus.  + wheezing.   Skin:    rash or lesions. GI:  No-   heartburn, indigestion, abdominal pain, nausea, vomiting, diarrhea,                 change in bowel habits, loss of appetite GU: dysuria, change in color of urine, no urgency or frequency.   flank pain. MS:   joint pain, stiffness, decreased range of motion, back pain. Neuro-     nothing unusual Psych:  change in mood  or affect.  depression or anxiety.   memory loss.  OBJ- Physical Exam General- Alert, Oriented, Affect-appropriate, Distress- none acute, + morbidly obese Skin- rash-none, lesions- none, excoriation- none Lymphadenopathy- none Head- atraumatic, + wearing head scarf after chemotherapy            Eyes- Gross vision intact, PERRLA, conjunctivae and secretions clear            Ears-  Hearing, canals-normal            Nose- + turbinate edema, no-Septal dev, mucus, polyps, erosion, perforation             Throat- Mallampati III , mucosa clear , drainage- none, tonsils- atrophic Neck- flexible , trachea midline, ? stridor-versus resonant inspiratory sound , thyroid R mass identified as" goiter" , carotid no bruit Chest - symmetrical excursion , unlabored           Heart/CV- RRR , no murmur , no gallop  , no rub, nl s1 s2                           - JVD- none , edema- none, stasis changes- none, varices- none           Lung- clear, slight inspiratory stridor quality. wheeze- none, cough- none , dullness-none, rub- none           Chest wall-  Abd-  Br/ Gen/ Rectal- Not done, not indicated Extrem- cyanosis- none, clubbing, none, atrophy- none, strength- nl Neuro- grossly intact to observation

## 2017-04-21 NOTE — Telephone Encounter (Signed)
Voicemail received requesting "I saw Mendel Ryder last week and was sent to the Rehab center.  I need orders faxed for compression bras.  Could you get a message to her for this orde."  Message forwarded to [phone].  Further patient communication from collaborative nurse.

## 2017-04-22 ENCOUNTER — Ambulatory Visit: Payer: Medicare HMO | Admitting: Physical Therapy

## 2017-04-22 ENCOUNTER — Encounter: Payer: Self-pay | Admitting: *Deleted

## 2017-04-22 ENCOUNTER — Ambulatory Visit (INDEPENDENT_AMBULATORY_CARE_PROVIDER_SITE_OTHER): Payer: Medicare HMO | Admitting: *Deleted

## 2017-04-22 VITALS — BP 154/83 | HR 66 | Ht 66.0 in | Wt 324.8 lb

## 2017-04-22 DIAGNOSIS — J4520 Mild intermittent asthma, uncomplicated: Secondary | ICD-10-CM | POA: Insufficient documentation

## 2017-04-22 DIAGNOSIS — Z0000 Encounter for general adult medical examination without abnormal findings: Secondary | ICD-10-CM | POA: Diagnosis not present

## 2017-04-22 DIAGNOSIS — C50912 Malignant neoplasm of unspecified site of left female breast: Secondary | ICD-10-CM | POA: Diagnosis not present

## 2017-04-22 DIAGNOSIS — Z853 Personal history of malignant neoplasm of breast: Secondary | ICD-10-CM | POA: Diagnosis not present

## 2017-04-22 DIAGNOSIS — M25512 Pain in left shoulder: Secondary | ICD-10-CM | POA: Diagnosis not present

## 2017-04-22 DIAGNOSIS — I890 Lymphedema, not elsewhere classified: Secondary | ICD-10-CM | POA: Diagnosis not present

## 2017-04-22 DIAGNOSIS — R293 Abnormal posture: Secondary | ICD-10-CM

## 2017-04-22 NOTE — Assessment & Plan Note (Signed)
Recurrent pansinusitis Plan-amoxicillin, nasal saline rinse

## 2017-04-22 NOTE — Therapy (Signed)
[place], [place], [address] Phone: [phone]   Fax:  [phone]  Physical Therapy Treatment  Patient Details  Name: [name] MRN: [id number] Date of Birth: May 24, 1951 Referring Provider: Wilber Bihari   Encounter Date: 04/22/2017      PT End of Session - 04/22/17 1200    Visit Number 2   Number of Visits 9   Date for PT Re-Evaluation 05/23/17   PT Start Time 1100   PT Stop Time 1145   PT Time Calculation (min) 45 min   Activity Tolerance Patient tolerated treatment well   Behavior During Therapy [facility] for tasks assessed/performed      Past Medical History:  Diagnosis Date  . Anemia 04/05/2012  . Anxiety   . Anxiety state 09/11/2007   Qualifier: Diagnosis of  By: Scherrie Gerlach    . Atrial tachycardia, paroxysmal ([place])   . Back pain 10/02/2014  . Breast cancer ([place]) 02/17/2017  . Chicken pox as a child  . Chronic diastolic CHF (congestive heart failure), NYHA class 1 ([place])   . Complication of anesthesia    pt states feels different in her body after anesthesia when waking up and also experiences a smell of burnt plastic for approx a wk   . Dilated aortic root ([place])    48mm by echo 09/2016  . Dysuria 02/17/2017  . Elevated LFTs 04/05/2012  . GERD (gastroesophageal reflux disease)   . Goiter   . Heart murmur    hx of one at birth   . History of kidney stones   . History of radiation therapy 10/31/16-12/17/16   left breast 45 Gy in 25 fractions, left breast boost 16 Gy in 8 fractions  . Hx of colonic polyps   . Hyperlipidemia   . Hypertension   . Insomnia 10/08/2016  . Malignant neoplasm of upper-outer quadrant of left female breast ([place]) 05/14/2016   not with patient  . Measles as a child  . Mumps as a child  . OA (osteoarthritis) of knee 04/05/2012  . Obesity 07/11/2014  . PONV (postoperative nausea and vomiting)   . Preventative health care 09/05/2013  . PVC's (premature  ventricular contractions) 05/02/2016  . Shortness of breath dyspnea    walking distances / climbing stairs  . Sleep apnea 04/05/2012  . Tinea corporis 02/23/2013  . Type 2 diabetes mellitus with hyperglycemia ([place]) 12/31/2013  . Vasomotor rhinitis 04/05/2012  . Ventral hernia     Past Surgical History:  Procedure Laterality Date  . BREAST LUMPECTOMY WITH RADIOACTIVE SEED AND SENTINEL LYMPH NODE BIOPSY Left 06/21/2016   Procedure: LEFT BREAST LUMPECTOMY WITH RADIOACTIVE SEED AND SENTINEL LYMPH NODE BIOPSY, INJECT BLUE DYE LEFT BREAST;  Surgeon: Fanny Skates, MD;  Location: [facility];  Service: General;  Laterality: Left;  . CARDIAC CATHETERIZATION     normal coroary arteries per patient  . CARDIOVASCULAR STRESS TEST     10/12/2013  . CESAREAN SECTION     X 3  . CHOLECYSTECTOMY    . COLONOSCOPY WITH PROPOFOL N/A 03/28/2015   Procedure: COLONOSCOPY WITH PROPOFOL;  Surgeon: Juanita Craver, MD;  Location: [facility];  Service: Endoscopy;  Laterality: N/A;  . HERNIA REPAIR  02/08/11   ventral hernia  . JOINT REPLACEMENT     bilateral  . KNEE ARTHROSCOPY  05/2010   bilateral  . LEFT AND RIGHT HEART CATHETERIZATION WITH CORONARY ANGIOGRAM N/A 11/08/2013   Procedure: LEFT AND RIGHT HEART CATHETERIZATION WITH CORONARY ANGIOGRAM;  Surgeon: [name], MD;  Location: [facility] CATH LAB;  Service: Cardiovascular;  Laterality: N/A;  . MENISCUS REPAIR  2009  . MOUTH SURGERY     teeth implants  . PORT-A-CATH REMOVAL N/A 01/24/2017   Procedure: REMOVAL PORT-A-CATH;  Surgeon: Fanny Skates, MD;  Location: [facility];  Service: General;  Laterality: N/A;  . PORTACATH PLACEMENT Right 07/16/2016   Procedure: INSERTION PORT-A-CATH RIGHT INTERNAL JUGULAR WITH ULTRASOUND;  Surgeon: Fanny Skates, MD;  Location: [facility];  Service: General;  Laterality: Right;  . TOTAL KNEE ARTHROPLASTY  2011   left  . TOTAL KNEE ARTHROPLASTY Right 05/10/2014   Procedure: RIGHT TOTAL KNEE ARTHROPLASTY;  Surgeon: Mauri Pole,  MD;  Location: [facility];  Service: Orthopedics;  Laterality: Right;  . TUBAL LIGATION    . WISDOM TOOTH EXTRACTION  2000    There were no vitals filed for this visit.      Subjective Assessment - 04/22/17 1154    Subjective Pt reports she has an appointment to get a compression bra this afternoon.    Pertinent History left breast cancer with left lumpectomy 06/21/2016 with 1/5 lymph nodes positive, chemo from 2/1-2/19/ 2018, Radiation from 5/10- 6/7/ 2018, currently on anastrazole    Patient Stated Goals to get rid of the hard ridge in my left breast    Currently in Pain? No/denies  says that her left breast is sore when she presses on it                          [facility] Adult PT Treatment/Exercise - 04/22/17 0001      Exercises   Exercises Shoulder     Shoulder Exercises: Supine   Flexion AAROM;Both;5 reps   Flexion Limitations with dowel in supine. Pt feels pulling in left axilla      Shoulder Exercises: Sidelying   ABduction AROM;Left;5 reps   Other Sidelying Exercises small circles with hand pointed to ceiling in each direction      Manual Therapy   Manual Therapy Manual Lymphatic Drainage (MLD)   Manual therapy comments verbal and hand over hand instruction with pt showing good understanding of initial instruction    Manual Lymphatic Drainage (MLD) in supine with head elevated. Short neck, 5 diaphragmatic breaths, right axillary nodes, anterior interaxillary anastamosis, left inguinal nodes, left axilloinguinal anasatmosis, left breast with upper quadrant going to right axillary nodes, and lower quadrant going toward inguinal nodes.                 PT Education - 04/22/17 1156    Education provided Yes   Education Details self manual lymph drainage. Gave handout and klosetraining video to try    Person(s) Educated Patient   Methods Explanation;Demonstration;Handout   Comprehension Need further instruction                    Plan -  04/22/17 1200    Clinical Impression Statement Pt reports she felt that her breast felt less full under the area that she was wearing foam pad. She did well with initial instruction for self MLD.  She has soreness in left pec major area with left shoulder stretching    Clinical Impairments Affecting Rehab Potential previous radiation and 5 nodes removed    PT Next Visit Plan continue perform and teach manual lymph drainage, progress dowel rod exercise for shoulder range of motion, continue assess foam patch Later, progress shoulder strengthening.  Consulted and Agree with Plan of Care Patient      Patient will benefit from skilled therapeutic intervention in order to improve the following deficits and impairments:     Visit Diagnosis: Abnormal posture  Lymphedema, not elsewhere classified  Acute pain of left shoulder     Problem List Patient Active Problem List   Diagnosis Date Noted  . Breast cancer ([place]) 02/17/2017  . Dysuria 02/17/2017  . Insomnia 10/08/2016  . Dilated aortic root ([place])   . Skin rash 08/08/2016  . Port catheter in place 08/01/2016  . Malignant neoplasm of upper-outer quadrant of left female breast ([place]) 05/14/2016  . PVC's (premature ventricular contractions) 05/02/2016  . Neck mass 04/21/2016  . LVH (left ventricular hypertrophy) due to hypertensive disease, with heart failure ([place]) 04/18/2016  . Tonsillar hypertrophy 07/07/2015  . Back pain 10/02/2014  . Obesity 07/11/2014  . S/P right TKA 05/10/2014  . S/P knee replacement 05/10/2014  . Atrial tachycardia, paroxysmal ([place]) 02/22/2014  . Type 2 diabetes mellitus with hyperglycemia ([place]) 12/31/2013  . Acute sinusitis with symptoms > 10 days 12/20/2013  . Chronic diastolic CHF (congestive heart failure) ([place]) 11/22/2013  . CAD (coronary artery disease), native coronary artery 11/22/2013  . Dyspnea on exertion 11/03/2013  . Undiagnosed cardiac murmurs 09/05/2013  . Preventative health care 09/05/2013   . Tinea corporis 02/23/2013  . Musculoskeletal pain 11/10/2012  . Vasomotor rhinitis 04/05/2012  . Obstructive sleep apnea 04/05/2012  . Anemia 04/05/2012  . Elevated LFTs 04/05/2012  . OA (osteoarthritis) of knee 04/05/2012  . Hernia 10/13/2010  . Hyperlipidemia 09/11/2007  . Anxiety and depression 09/11/2007  . Essential hypertension 09/11/2007  . GERD 09/11/2007  . RENAL CALCULUS 09/11/2007  . RENAL CYST 09/11/2007  . COLONIC POLYPS, HX OF 09/11/2007   Donato Heinz. Owens Shark PT  Norwood Levo 04/22/2017, 12:04 PM  [place], [place], [address] Phone: [phone]   Fax:  [phone]  Name: ROYLENE HEATON MRN: [id number] Date of Birth: 1950-12-27

## 2017-04-22 NOTE — Patient Instructions (Signed)
Continue to eat heart healthy diet (full of fruits, vegetables, whole grains, lean protein, water--limit salt, fat, and sugar intake) and increase physical activity as tolerated.  Continue doing brain stimulating activities (puzzles, reading, adult coloring books, staying active) to keep memory sharp.   Bring a copy of your living will and/or healthcare power of attorney to your next office visit.   [name] , Thank you for taking time to come for your Medicare Wellness Visit. I appreciate your ongoing commitment to your health goals. Please review the following plan we discussed and let me know if I can assist you in the future.   These are the goals we discussed: Goals    . Increase physical activity (pt-stated)          Restart Silver Sneakers.    . Weight (lb) < 300 lb (136.1 kg) (pt-stated)          With diet        This is a list of the screening recommended for you and due dates:  Health Maintenance  Topic Date Due  . Complete foot exam   [birth year]21/1960  . Eye exam for diabetics  11[birth year]21/1960  . Pneumonia vaccines (2 of 2 - PPSV23) 04/18/2017  . Hemoglobin A1C  08/20/2017  . Mammogram  04/29/2018  . Tetanus Vaccine  06/24/2018  . Colon Cancer Screening  03/27/2025  . Flu Shot  Completed  . DEXA scan (bone density measurement)  Completed  .  Hepatitis C: One time screening is recommended by Center for Disease Control  (CDC) for  adults born from [birth year] through 1965.   Completed   Diabetes Mellitus and Food It is important for you to manage your blood sugar (glucose) level. Your blood glucose level can be greatly affected by what you eat. Eating healthier foods in the appropriate amounts throughout the day at about the same time each day will help you control your blood glucose level. It can also help slow or prevent worsening of your diabetes mellitus. Healthy eating may even help you improve the level of your blood pressure and reach or maintain a healthy weight. General  recommendations for healthful eating and cooking habits include:  Eating meals and snacks regularly. Avoid going long periods of time without eating to lose weight.  Eating a diet that consists mainly of plant-based foods, such as fruits, vegetables, nuts, legumes, and whole grains.  Using low-heat cooking methods, such as baking, instead of high-heat cooking methods, such as deep frying.  Work with your dietitian to make sure you understand how to use the Nutrition Facts information on food labels. How can food affect me? Carbohydrates Carbohydrates affect your blood glucose level more than any other type of food. Your dietitian will help you determine how many carbohydrates to eat at each meal and teach you how to count carbohydrates. Counting carbohydrates is important to keep your blood glucose at a healthy level, especially if you are using insulin or taking certain medicines for diabetes mellitus. Alcohol Alcohol can cause sudden decreases in blood glucose (hypoglycemia), especially if you use insulin or take certain medicines for diabetes mellitus. Hypoglycemia can be a life-threatening condition. Symptoms of hypoglycemia (sleepiness, dizziness, and disorientation) are similar to symptoms of having too much alcohol. If your health care provider has given you approval to drink alcohol, do so in moderation and use the following guidelines:  Women should not have more than one drink per day, and men should not have more than two drinks  per day. One drink is equal to: ? 12 oz of beer. ? 5 oz of wine. ? 1 oz of hard liquor.  Do not drink on an empty stomach.  Keep yourself hydrated. Have water, diet soda, or unsweetened iced tea.  Regular soda, juice, and other mixers might contain a lot of carbohydrates and should be counted.  What foods are not recommended? As you make food choices, it is important to remember that all foods are not the same. Some foods have fewer nutrients per  serving than other foods, even though they might have the same number of calories or carbohydrates. It is difficult to get your body what it needs when you eat foods with fewer nutrients. Examples of foods that you should avoid that are high in calories and carbohydrates but low in nutrients include:  Trans fats (most processed foods list trans fats on the Nutrition Facts label).  Regular soda.  Juice.  Candy.  Sweets, such as cake, pie, doughnuts, and cookies.  Fried foods.  What foods can I eat? Eat nutrient-rich foods, which will nourish your body and keep you healthy. The food you should eat also will depend on several factors, including:  The calories you need.  The medicines you take.  Your weight.  Your blood glucose level.  Your blood pressure level.  Your cholesterol level.  You should eat a variety of foods, including:  Protein. ? Lean cuts of meat. ? Proteins low in saturated fats, such as fish, egg whites, and beans. Avoid processed meats.  Fruits and vegetables. ? Fruits and vegetables that may help control blood glucose levels, such as apples, mangoes, and yams.  Dairy products. ? Choose fat-free or low-fat dairy products, such as milk, yogurt, and cheese.  Grains, bread, pasta, and rice. ? Choose whole grain products, such as multigrain bread, whole oats, and brown rice. These foods may help control blood pressure.  Fats. ? Foods containing healthful fats, such as nuts, avocado, olive oil, canola oil, and fish.  Does everyone with diabetes mellitus have the same meal plan? Because every person with diabetes mellitus is different, there is not one meal plan that works for everyone. It is very important that you meet with a dietitian who will help you create a meal plan that is just right for you. This information is not intended to replace advice given to you by your health care provider. Make sure you discuss any questions you have with your health care  provider. Document Released: 03/07/2005 Document Revised: 11/16/2015 Document Reviewed: 05/07/2013 Elsevier Interactive Patient Education  2017 [place] Maintenance for Postmenopausal Women Menopause is a normal process in which your reproductive ability comes to an end. This process happens gradually over a span of months to years, usually between the ages of 13 and 16. Menopause is complete when you have missed 12 consecutive menstrual periods. It is important to talk with your health care provider about some of the most common conditions that affect postmenopausal women, such as heart disease, cancer, and bone loss (osteoporosis). Adopting a healthy lifestyle and getting preventive care can help to promote your health and wellness. Those actions can also lower your chances of developing some of these common conditions. What should I know about menopause? During menopause, you may experience a number of symptoms, such as:  Moderate-to-severe hot flashes.  Night sweats.  Decrease in sex drive.  Mood swings.  Headaches.  Tiredness.  Irritability.  Memory problems.  Insomnia.  Choosing  to treat or not to treat menopausal changes is an individual decision that you make with your health care provider. What should I know about hormone replacement therapy and supplements? Hormone therapy products are effective for treating symptoms that are associated with menopause, such as hot flashes and night sweats. Hormone replacement carries certain risks, especially as you become older. If you are thinking about using estrogen or estrogen with progestin treatments, discuss the benefits and risks with your health care provider. What should I know about heart disease and stroke? Heart disease, heart attack, and stroke become more likely as you age. This may be due, in part, to the hormonal changes that your body experiences during menopause. These can affect how your body processes  dietary fats, triglycerides, and cholesterol. Heart attack and stroke are both medical emergencies. There are many things that you can do to help prevent heart disease and stroke:  Have your blood pressure checked at least every 1-2 years. High blood pressure causes heart disease and increases the risk of stroke.  If you are 79-73 years old, ask your health care provider if you should take aspirin to prevent a heart attack or a stroke.  Do not use any tobacco products, including cigarettes, chewing tobacco, or electronic cigarettes. If you need help quitting, ask your health care provider.  It is important to eat a healthy diet and maintain a healthy weight. ? Be sure to include plenty of vegetables, fruits, low-fat dairy products, and lean protein. ? Avoid eating foods that are high in solid fats, added sugars, or salt (sodium).  Get regular exercise. This is one of the most important things that you can do for your health. ? Try to exercise for at least 150 minutes each week. The type of exercise that you do should increase your heart rate and make you sweat. This is known as moderate-intensity exercise. ? Try to do strengthening exercises at least twice each week. Do these in addition to the moderate-intensity exercise.  Know your numbers.Ask your health care provider to check your cholesterol and your blood glucose. Continue to have your blood tested as directed by your health care provider.  What should I know about cancer screening? There are several types of cancer. Take the following steps to reduce your risk and to catch any cancer development as early as possible. Breast Cancer  Practice breast self-awareness. ? This means understanding how your breasts normally appear and feel. ? It also means doing regular breast self-exams. Let your health care provider know about any changes, no matter how small.  If you are 4 or older, have a clinician do a breast exam (clinical breast  exam or CBE) every year. Depending on your age, family history, and medical history, it may be recommended that you also have a yearly breast X-ray (mammogram).  If you have a family history of breast cancer, talk with your health care provider about genetic screening.  If you are at high risk for breast cancer, talk with your health care provider about having an MRI and a mammogram every year.  Breast cancer (BRCA) gene test is recommended for women who have family members with BRCA-related cancers. Results of the assessment will determine the need for genetic counseling and BRCA1 and for BRCA2 testing. BRCA-related cancers include these types: ? Breast. This occurs in males or females. ? Ovarian. ? Tubal. This may also be called fallopian tube cancer. ? Cancer of the abdominal or pelvic lining (peritoneal cancer). ? Prostate. ?  Pancreatic.  Cervical, Uterine, and Ovarian Cancer Your health care provider may recommend that you be screened regularly for cancer of the pelvic organs. These include your ovaries, uterus, and vagina. This screening involves a pelvic exam, which includes checking for microscopic changes to the surface of your cervix (Pap test).  For women ages 21-65, health care providers may recommend a pelvic exam and a Pap test every three years. For women ages 63-65, they may recommend the Pap test and pelvic exam, combined with testing for human papilloma virus (HPV), every five years. Some types of HPV increase your risk of cervical cancer. Testing for HPV may also be done on women of any age who have unclear Pap test results.  Other health care providers may not recommend any screening for nonpregnant women who are considered low risk for pelvic cancer and have no symptoms. Ask your health care provider if a screening pelvic exam is right for you.  If you have had past treatment for cervical cancer or a condition that could lead to cancer, you need Pap tests and screening for  cancer for at least 20 years after your treatment. If Pap tests have been discontinued for you, your risk factors (such as having a new sexual partner) need to be reassessed to determine if you should start having screenings again. Some women have medical problems that increase the chance of getting cervical cancer. In these cases, your health care provider may recommend that you have screening and Pap tests more often.  If you have a family history of uterine cancer or ovarian cancer, talk with your health care provider about genetic screening.  If you have vaginal bleeding after reaching menopause, tell your health care provider.  There are currently no reliable tests available to screen for ovarian cancer.  Lung Cancer Lung cancer screening is recommended for adults 18-78 years old who are at high risk for lung cancer because of a history of smoking. A yearly low-dose CT scan of the lungs is recommended if you:  Currently smoke.  Have a history of at least 30 pack-years of smoking and you currently smoke or have quit within the past 15 years. A pack-year is smoking an average of one pack of cigarettes per day for one year.  Yearly screening should:  Continue until it has been 15 years since you quit.  Stop if you develop a health problem that would prevent you from having lung cancer treatment.  Colorectal Cancer  This type of cancer can be detected and can often be prevented.  Routine colorectal cancer screening usually begins at age 55 and continues through age 39.  If you have risk factors for colon cancer, your health care provider may recommend that you be screened at an earlier age.  If you have a family history of colorectal cancer, talk with your health care provider about genetic screening.  Your health care provider may also recommend using home test kits to check for hidden blood in your stool.  A small camera at the end of a tube can be used to examine your colon  directly (sigmoidoscopy or colonoscopy). This is done to check for the earliest forms of colorectal cancer.  Direct examination of the colon should be repeated every 5-10 years until age 69. However, if early forms of precancerous polyps or small growths are found or if you have a family history or genetic risk for colorectal cancer, you may need to be screened more often.  Skin Cancer  Check your skin from head to toe regularly.  Monitor any moles. Be sure to tell your health care provider: ? About any new moles or changes in moles, especially if there is a change in a mole's shape or color. ? If you have a mole that is larger than the size of a pencil eraser.  If any of your family members has a history of skin cancer, especially at a young age, talk with your health care provider about genetic screening.  Always use sunscreen. Apply sunscreen liberally and repeatedly throughout the day.  Whenever you are outside, protect yourself by wearing long sleeves, pants, a wide-brimmed hat, and sunglasses.  What should I know about osteoporosis? Osteoporosis is a condition in which bone destruction happens more quickly than new bone creation. After menopause, you may be at an increased risk for osteoporosis. To help prevent osteoporosis or the bone fractures that can happen because of osteoporosis, the following is recommended:  If you are 56-46 years old, get at least 1,000 mg of calcium and at least 600 mg of vitamin D per day.  If you are older than age 25 but younger than age 68, get at least 1,200 mg of calcium and at least 600 mg of vitamin D per day.  If you are older than age 77, get at least 1,200 mg of calcium and at least 800 mg of vitamin D per day.  Smoking and excessive alcohol intake increase the risk of osteoporosis. Eat foods that are rich in calcium and vitamin D, and do weight-bearing exercises several times each week as directed by your health care provider. What should I  know about how menopause affects my mental health? Depression may occur at any age, but it is more common as you become older. Common symptoms of depression include:  Low or sad mood.  Changes in sleep patterns.  Changes in appetite or eating patterns.  Feeling an overall lack of motivation or enjoyment of activities that you previously enjoyed.  Frequent crying spells.  Talk with your health care provider if you think that you are experiencing depression. What should I know about immunizations? It is important that you get and maintain your immunizations. These include:  Tetanus, diphtheria, and pertussis (Tdap) booster vaccine.  Influenza every year before the flu season begins.  Pneumonia vaccine.  Shingles vaccine.  Your health care provider may also recommend other immunizations. This information is not intended to replace advice given to you by your health care provider. Make sure you discuss any questions you have with your health care provider. Document Released: 08/02/2005 Document Revised: 12/29/2015 Document Reviewed: 03/14/2015 Elsevier Interactive Patient Education  2018 [employer].

## 2017-04-22 NOTE — Patient Instructions (Signed)
Manual Lymph Drainage for Left Breast.  Do daily.  Do slowly. Use flat hands with just enough pressure to stretch the skin. Do not slide over the skin, but move the skin with the hand you're using. Lie down or sit comfortably (in a recliner, for example) to do this.  1) Hug yourself:  cross arms and do circles at collar bones near neck 5-7 times (to "wake up" lots of lymph nodes in this area). 2) Take slow deep breaths, allowing your belly to balloon out as your breathe in, 5x (to "wake up" abdominal lymph nodes to take on extra fluid). 3) Right armpit-stretch skin in small circles to stimulate intact lymph nodes there, 5-7x. 4) Left groin area, at panty line-stretch skin in small circles to stimulate lymph nodes 5-7x. 5) Redirect fluid from left chest toward right armpit (stretch skin starting at left chest in 3-4 spots working toward right armpit) 3-4x across the chest. 6) Redirect fluid from left armpit toward left groin (cup your hand around the curve of your left side and do 3-4 "pumps" from armpit to groin) 3-4x down your side. 7) Draw an imaginary diagonal line from upper outer breast through the nipple area toward lower inner breast.  Direct fluid upward and inward from this line toward the pathway across your upper chest (established in #5).  Do this in three rows to treat all of the upper inner breast tissue, and do each row 3-4x. 8) Then repeat #5 above. 9) Direct fluid to treat all of lower outer breast tissue downward and outward toward pathway established in #6 that is aimed at the left groin. 10)  Then repeat #6 above. 11)  End with repeating #3 and #4 above.   [facility] [address] [place], [place]   [id number] [phone] 

## 2017-04-22 NOTE — Assessment & Plan Note (Signed)
She doesn't recognize sleep disturbance from her breathing and rarely needs rescue inhaler. PFT in September suggested significant reactive airways disease so this may need to be revisited. Plan-refill Ventolin inhaler.

## 2017-04-22 NOTE — Assessment & Plan Note (Signed)
She is satisfied that she benefits from CPAP and needs to keep using it. She describes good compliance and control with no snore through and good sleep quality now.

## 2017-04-24 ENCOUNTER — Ambulatory Visit: Payer: Medicare HMO | Attending: Adult Health | Admitting: Physical Therapy

## 2017-04-24 DIAGNOSIS — M25512 Pain in left shoulder: Secondary | ICD-10-CM | POA: Diagnosis not present

## 2017-04-24 DIAGNOSIS — R293 Abnormal posture: Secondary | ICD-10-CM

## 2017-04-24 DIAGNOSIS — I890 Lymphedema, not elsewhere classified: Secondary | ICD-10-CM | POA: Diagnosis not present

## 2017-04-24 NOTE — Patient Instructions (Signed)
Flexion (Eccentric) - Active-Assist (Cane)          [facility] [phone]    Use unaffected arm to push affected arm forward. Avoid hiking shoulder (shoulder should NOT touch cheek). Keep palm relaxed. Slowly lower affected arm. Hold stretch for _5_ seconds repeating _5-10_ times, _1-2_ times a day.  Abduction (Eccentric) - Active-Assist (Cane)    Use unaffected arm to push affected arm out to side. Avoid hiking shoulder (shoulder should NOT touch cheek). Keep palm relaxed. Slowly lower affected arm. Hold stretch _5_ seconds repeating _5-10_ times, _1-2_ times a day.  Cane Exercise: Extension   Stand holding cane behind back with both hands palm-up. Lift the cane away from body until gentle stretch felt. Do NOT lean forward.  Hold __5__ seconds. Repeat _5-10___ times. Do _1-2___ sessions per day.  CHEST: Doorway, Bilateral - Standing    Standing in doorway, place hands on wall with elbows bent at shoulder height and place one foot in front of other. Shift weight onto front foot. Hold _10-20__ seconds. Do _3-5_ times, _1-2_ times a day.     

## 2017-04-24 NOTE — Therapy (Signed)
[place], [place], [address] Phone: [phone]   Fax:  [phone]  Physical Therapy Treatment  Patient Details  Name: JONISHA KINDIG MRN: [id number] Date of Birth: 1951-02-16 Referring Provider: Wilber Bihari   Encounter Date: 04/24/2017      PT End of Session - 04/24/17 1345    Visit Number 3   Number of Visits 9   Date for PT Re-Evaluation 05/23/17   PT Start Time 1300   PT Stop Time 1343   PT Time Calculation (min) 43 min   Activity Tolerance Patient tolerated treatment well   Behavior During Therapy [facility] for tasks assessed/performed      Past Medical History:  Diagnosis Date  . Anemia 04/05/2012  . Anxiety   . Anxiety state 09/11/2007   Qualifier: Diagnosis of  By: Scherrie Gerlach    . Atrial tachycardia, paroxysmal ([place])   . Back pain 10/02/2014  . Breast cancer ([place]) 02/17/2017  . Chicken pox as a child  . Chronic diastolic CHF (congestive heart failure), NYHA class 1 ([place])   . Complication of anesthesia    pt states feels different in her body after anesthesia when waking up and also experiences a smell of burnt plastic for approx a wk   . Dilated aortic root ([place])    56mm by echo 09/2016  . Dysuria 02/17/2017  . Elevated LFTs 04/05/2012  . GERD (gastroesophageal reflux disease)   . Goiter   . Heart murmur    hx of one at birth   . History of kidney stones   . History of radiation therapy 10/31/16-12/17/16   left breast 45 Gy in 25 fractions, left breast boost 16 Gy in 8 fractions  . Hx of colonic polyps   . Hyperlipidemia   . Hypertension   . Insomnia 10/08/2016  . Malignant neoplasm of upper-outer quadrant of left female breast ([place]) 05/14/2016   not with patient  . Measles as a child  . Mumps as a child  . OA (osteoarthritis) of knee 04/05/2012  . Obesity 07/11/2014  . PONV (postoperative nausea and vomiting)   . Preventative health care 09/05/2013  . PVC's (premature  ventricular contractions) 05/02/2016  . Shortness of breath dyspnea    walking distances / climbing stairs  . Sleep apnea 04/05/2012  . Tinea corporis 02/23/2013  . Type 2 diabetes mellitus with hyperglycemia ([place]) 12/31/2013  . Vasomotor rhinitis 04/05/2012  . Ventral hernia     Past Surgical History:  Procedure Laterality Date  . BREAST LUMPECTOMY WITH RADIOACTIVE SEED AND SENTINEL LYMPH NODE BIOPSY Left 06/21/2016   Procedure: LEFT BREAST LUMPECTOMY WITH RADIOACTIVE SEED AND SENTINEL LYMPH NODE BIOPSY, INJECT BLUE DYE LEFT BREAST;  Surgeon: Fanny Skates, MD;  Location: [facility];  Service: General;  Laterality: Left;  . CARDIAC CATHETERIZATION     normal coroary arteries per patient  . CARDIOVASCULAR STRESS TEST     10/12/2013  . CESAREAN SECTION     X 3  . CHOLECYSTECTOMY    . COLONOSCOPY WITH PROPOFOL N/A 03/28/2015   Procedure: COLONOSCOPY WITH PROPOFOL;  Surgeon: Juanita Craver, MD;  Location: [facility];  Service: Endoscopy;  Laterality: N/A;  . HERNIA REPAIR  02/08/11   ventral hernia  . JOINT REPLACEMENT     bilateral  . KNEE ARTHROSCOPY  05/2010   bilateral  . LEFT AND RIGHT HEART CATHETERIZATION WITH CORONARY ANGIOGRAM N/A 11/08/2013   Procedure: LEFT AND RIGHT HEART CATHETERIZATION WITH CORONARY ANGIOGRAM;  Surgeon: [name], MD;  Location: [facility] CATH LAB;  Service: Cardiovascular;  Laterality: N/A;  . MENISCUS REPAIR  2009  . MOUTH SURGERY     teeth implants  . PORT-A-CATH REMOVAL N/A 01/24/2017   Procedure: REMOVAL PORT-A-CATH;  Surgeon: Fanny Skates, MD;  Location: [facility];  Service: General;  Laterality: N/A;  . PORTACATH PLACEMENT Right 07/16/2016   Procedure: INSERTION PORT-A-CATH RIGHT INTERNAL JUGULAR WITH ULTRASOUND;  Surgeon: Fanny Skates, MD;  Location: [facility];  Service: General;  Laterality: Right;  . TOTAL KNEE ARTHROPLASTY  2011   left  . TOTAL KNEE ARTHROPLASTY Right 05/10/2014   Procedure: RIGHT TOTAL KNEE ARTHROPLASTY;  Surgeon: Mauri Pole,  MD;  Location: [facility];  Service: Orthopedics;  Laterality: Right;  . TUBAL LIGATION    . WISDOM TOOTH EXTRACTION  2000    There were no vitals filed for this visit.      Subjective Assessment - 04/24/17 1305    Subjective Pt got the compression bra 2 days ago and has been wearing it even to sleep. She says she feels that her breast is better, feels softer .  It is still tender but not as bad as it was the other day    Pertinent History left breast cancer with left lumpectomy 06/21/2016 with 1/5 lymph nodes positive, chemo from 2/1-2/19/ 2018, Radiation from 5/10- 6/7/ 2018, currently on anastrazole    Patient Stated Goals to get rid of the hard ridge in my left breast    Currently in Pain? Yes   Pain Score 7    Pain Location Breast   Pain Orientation Left   Pain Descriptors / Indicators Tender   Pain Type Chronic pain   Pain Radiating Towards none                         [facility] Adult PT Treatment/Exercise - 04/24/17 0001      Self-Care   Other Self-Care Comments  provided a chip pack for more aggressive comprssion to inferior medial breast and white foam patch to lateral breast at incision      Shoulder Exercises: Standing   Other Standing Exercises standing dowel flexion, abduction and extension    Other Standing Exercises dooway stretch for external rotation x 30 secn     Shoulder Exercises: Pulleys   Flexion 2 minutes   ABduction 2 minutes     Shoulder Exercises: ROM/Strengthening   Wall Wash into felxion x 5 reps      Manual Therapy   Manual Therapy Manual Lymphatic Drainage (MLD)   Manual therapy comments reviewed self technique   Manual Lymphatic Drainage (MLD) in supine with head elevated. Short neck, 5 diaphragmatic breaths, right axillary nodes, anterior interaxillary anastamosis, left inguinal nodes, left axilloinguinal anasatmosis, left breast with upper quadrant going to right axillary nodes, and lower quadrant going toward inguinal nodes.                              Plan - 04/24/17 1346    Clinical Impression Statement Pt is happy with her Standley Brooking compression bra and feels that it is making a difference.  Upgraded foam patch and chip pack to full and tender areas  Upgraded home exercise to standing dowel and wall stretches and added pulleys    Clinical Impairments Affecting Rehab Potential previous radiation and 5 nodes removed    PT Next Visit Plan continue  perform and teach manual lymph drainage, progress dowel rod exercise for shoulder range of motion, continue assess foam patch Later, progress shoulder strengthening.        Patient will benefit from skilled therapeutic intervention in order to improve the following deficits and impairments:  Decreased skin integrity, Obesity, Increased edema, Decreased scar mobility, Decreased knowledge of precautions, Decreased knowledge of use of DME, Increased fascial restricitons, Pain, Decreased range of motion, Postural dysfunction  Visit Diagnosis: Abnormal posture  Lymphedema, not elsewhere classified  Acute pain of left shoulder     Problem List Patient Active Problem List   Diagnosis Date Noted  . Asthmatic bronchitis, mild intermittent, uncomplicated 84/69/6295  . Breast cancer ([place]) 02/17/2017  . Dysuria 02/17/2017  . Insomnia 10/08/2016  . Dilated aortic root ([place])   . Skin rash 08/08/2016  . Port catheter in place 08/01/2016  . Malignant neoplasm of upper-outer quadrant of left female breast ([place]) 05/14/2016  . PVC's (premature ventricular contractions) 05/02/2016  . Neck mass 04/21/2016  . LVH (left ventricular hypertrophy) due to hypertensive disease, with heart failure ([place]) 04/18/2016  . Tonsillar hypertrophy 07/07/2015  . Back pain 10/02/2014  . Obesity 07/11/2014  . S/P right TKA 05/10/2014  . S/P knee replacement 05/10/2014  . Atrial tachycardia, paroxysmal ([place]) 02/22/2014  . Type 2 diabetes mellitus with hyperglycemia ([place])  12/31/2013  . Acute sinusitis with symptoms > 10 days 12/20/2013  . Chronic diastolic CHF (congestive heart failure) ([place]) 11/22/2013  . CAD (coronary artery disease), native coronary artery 11/22/2013  . Dyspnea on exertion 11/03/2013  . Undiagnosed cardiac murmurs 09/05/2013  . Preventative health care 09/05/2013  . Tinea corporis 02/23/2013  . Musculoskeletal pain 11/10/2012  . Vasomotor rhinitis 04/05/2012  . Obstructive sleep apnea 04/05/2012  . Anemia 04/05/2012  . Elevated LFTs 04/05/2012  . OA (osteoarthritis) of knee 04/05/2012  . Hernia 10/13/2010  . Hyperlipidemia 09/11/2007  . Anxiety and depression 09/11/2007  . Essential hypertension 09/11/2007  . GERD 09/11/2007  . RENAL CALCULUS 09/11/2007  . RENAL CYST 09/11/2007  . COLONIC POLYPS, HX OF 09/11/2007   Donato Heinz. Owens Shark PT  Norwood Levo 04/24/2017, 1:48 PM  [place], [place], [address] Phone: [phone]   Fax:  [phone]  Name: SHARLEY KEELER MRN: [id number] Date of Birth: 1950/10/06

## 2017-04-29 ENCOUNTER — Ambulatory Visit (HOSPITAL_BASED_OUTPATIENT_CLINIC_OR_DEPARTMENT_OTHER): Payer: Medicare HMO | Admitting: Adult Health

## 2017-04-29 ENCOUNTER — Ambulatory Visit: Payer: Medicare HMO | Admitting: Physical Therapy

## 2017-04-29 ENCOUNTER — Encounter: Payer: Self-pay | Admitting: Adult Health

## 2017-04-29 VITALS — BP 124/73 | HR 80 | Temp 98.4°F | Resp 17 | Ht 66.0 in | Wt 325.1 lb

## 2017-04-29 DIAGNOSIS — N649 Disorder of breast, unspecified: Secondary | ICD-10-CM

## 2017-04-29 DIAGNOSIS — I890 Lymphedema, not elsewhere classified: Secondary | ICD-10-CM

## 2017-04-29 DIAGNOSIS — R293 Abnormal posture: Secondary | ICD-10-CM

## 2017-04-29 DIAGNOSIS — C50412 Malignant neoplasm of upper-outer quadrant of left female breast: Secondary | ICD-10-CM

## 2017-04-29 DIAGNOSIS — Z170 Estrogen receptor positive status [ER+]: Principal | ICD-10-CM

## 2017-04-29 DIAGNOSIS — C773 Secondary and unspecified malignant neoplasm of axilla and upper limb lymph nodes: Secondary | ICD-10-CM

## 2017-04-29 DIAGNOSIS — L03818 Cellulitis of other sites: Secondary | ICD-10-CM

## 2017-04-29 MED ORDER — DOXYCYCLINE HYCLATE 100 MG PO TABS: 100.0000 mg | ORAL_TABLET | Freq: Two times a day (BID) | ORAL | 0 refills | Status: DC

## 2017-04-29 NOTE — Assessment & Plan Note (Addendum)
06/21/16: Left lumpectomy: IDC grade 3, 1.1 cm, 1/5 LN Positive with ECE, Margins Neg, LVI Present; Er 30%, PR 0%, Ki 67 40%, Her 2 Positive Ratio 2.71; T1CN1 (Stage 2B)  Patient has multiple comorbidities including history of LVH with congestive heart failure  Treatment Plan: 1. Adjuvant Taxol with Herceptin And Perjeta (Last echocardiogram showed an ejection fraction of 55-60%-- repeat on 4/3/2018EF 60-65%) completed 01/09/2017 2. Followed by adjuvant radiation 3. Followed by adjuvant antiestrogen therapy with anastrozole 1 mg daily 5 years (bone density 04/29/2016 T score -1.2) ----------------------------------------------------------------------------------------------------------------------------------------------------- Current Treatment: Anastrozole 1 mg daily started 04/29/2016  Started Doxycycline 100 mg po bid for her left breast.  She was instructed to apply warm compresses to the area BID.  She has f/u with Dr. Dalbert Batman soon as well (it may need to be lanced).  She knows to call if it worsens in any way.

## 2017-04-29 NOTE — Therapy (Signed)
No treatment toda due to risk of cellulitis. Kandice Robinsons office of Wilber Bihari to see if there is a recommendation.     [place], [place], [address] Phone: [phone]   Fax:  [phone]  Physical Therapy Treatment  Patient Details  Name: [name] MRN: [id number] Date of Birth: January 01, 1951 Referring Provider: Wilber Bihari    Encounter Date: 04/29/2017    Past Medical History:  Diagnosis Date  . Anemia 04/05/2012  . Anxiety   . Anxiety state 09/11/2007   Qualifier: Diagnosis of  By: Scherrie Gerlach    . Atrial tachycardia, paroxysmal ([place])   . Back pain 10/02/2014  . Breast cancer ([place]) 02/17/2017  . Chicken pox as a child  . Chronic diastolic CHF (congestive heart failure), NYHA class 1 ([place])   . Complication of anesthesia    pt states feels different in her body after anesthesia when waking up and also experiences a smell of burnt plastic for approx a wk   . Dilated aortic root ([place])    108mm by echo 09/2016  . Dysuria 02/17/2017  . Elevated LFTs 04/05/2012  . GERD (gastroesophageal reflux disease)   . Goiter   . Heart murmur    hx of one at birth   . History of kidney stones   . History of radiation therapy 10/31/16-12/17/16   left breast 45 Gy in 25 fractions, left breast boost 16 Gy in 8 fractions  . Hx of colonic polyps   . Hyperlipidemia   . Hypertension   . Insomnia 10/08/2016  . Malignant neoplasm of upper-outer quadrant of left female breast ([place]) 05/14/2016   not with patient  . Measles as a child  . Mumps as a child  . OA (osteoarthritis) of knee 04/05/2012  . Obesity 07/11/2014  . PONV (postoperative nausea and vomiting)   . Preventative health care 09/05/2013  . PVC's (premature ventricular contractions) 05/02/2016  . Shortness of breath dyspnea    walking distances / climbing stairs  . Sleep apnea 04/05/2012  . Tinea corporis 02/23/2013  . Type 2 diabetes  mellitus with hyperglycemia ([place]) 12/31/2013  . Vasomotor rhinitis 04/05/2012  . Ventral hernia     Past Surgical History:  Procedure Laterality Date  . CARDIAC CATHETERIZATION     normal coroary arteries per patient  . CARDIOVASCULAR STRESS TEST     10/12/2013  . CESAREAN SECTION     X 3  . CHOLECYSTECTOMY    . HERNIA REPAIR  02/08/11   ventral hernia  . JOINT REPLACEMENT     bilateral  . KNEE ARTHROSCOPY  05/2010   bilateral  . MENISCUS REPAIR  2009  . MOUTH SURGERY     teeth implants  . TOTAL KNEE ARTHROPLASTY  2011   left  . TUBAL LIGATION    . WISDOM TOOTH EXTRACTION  2000    There were no vitals filed for this visit.  Subjective Assessment - 04/29/17 1319    Subjective  Pt had an increase in pain, redness , warmth around the site of the boil on her left breast breast over the weekend. She had been on antibiotics since last week.  She has been applying warm pads to the area and she feels like it might be a little better today     Pertinent History  left breast cancer with left lumpectomy 06/21/2016 with 1/5 lymph nodes positive, chemo from 2/1-2/19/ 2018, Radiation from 5/10-  6/7/ 2018, currently on anastrazole     Patient Stated Goals  to get rid of the hard ridge in my left breast     Currently in Pain?  Yes    Pain Score  4  pain increases with pressure to left breast    pain increases with pressure to left breast    Pain Location  Breast    Pain Orientation  Left    Pain Descriptors / Indicators  Tender    Pain Type  Acute pain                                         No treatment toda due to risk of cellulitis. Kandice Robinsons office of Wilber Bihari to see if there is a recommendation.          Patient will benefit from skilled therapeutic intervention in order to improve the following deficits and impairments:     Visit Diagnosis: No diagnosis found.     Problem List Patient Active Problem List   Diagnosis Date  Noted  . Asthmatic bronchitis, mild intermittent, uncomplicated 08/67/6195  . Breast cancer ([place]) 02/17/2017  . Dysuria 02/17/2017  . Insomnia 10/08/2016  . Dilated aortic root ([place])   . Skin rash 08/08/2016  . Port catheter in place 08/01/2016  . Malignant neoplasm of upper-outer quadrant of left female breast ([place]) 05/14/2016  . PVC's (premature ventricular contractions) 05/02/2016  . Neck mass 04/21/2016  . LVH (left ventricular hypertrophy) due to hypertensive disease, with heart failure ([place]) 04/18/2016  . Tonsillar hypertrophy 07/07/2015  . Back pain 10/02/2014  . Obesity 07/11/2014  . S/P right TKA 05/10/2014  . S/P knee replacement 05/10/2014  . Atrial tachycardia, paroxysmal ([place]) 02/22/2014  . Type 2 diabetes mellitus with hyperglycemia ([place]) 12/31/2013  . Acute sinusitis with symptoms > 10 days 12/20/2013  . Chronic diastolic CHF (congestive heart failure) ([place]) 11/22/2013  . CAD (coronary artery disease), native coronary artery 11/22/2013  . Dyspnea on exertion 11/03/2013  . Undiagnosed cardiac murmurs 09/05/2013  . Preventative health care 09/05/2013  . Tinea corporis 02/23/2013  . Musculoskeletal pain 11/10/2012  . Vasomotor rhinitis 04/05/2012  . Obstructive sleep apnea 04/05/2012  . Anemia 04/05/2012  . Elevated LFTs 04/05/2012  . OA (osteoarthritis) of knee 04/05/2012  . Hernia 10/13/2010  . Hyperlipidemia 09/11/2007  . Anxiety and depression 09/11/2007  . Essential hypertension 09/11/2007  . GERD 09/11/2007  . RENAL CALCULUS 09/11/2007  . RENAL CYST 09/11/2007  . COLONIC POLYPS, HX OF 09/11/2007   Donato Heinz. Owens Shark PT  Norwood Levo 04/29/2017, 1:42 PM  [place], [place], [address] Phone: [phone]   Fax:  [phone]  Name: [name] MRN: [id number] Date of Birth: 08-31-1950

## 2017-04-29 NOTE — Progress Notes (Signed)
[facility] Follow up:    [name] Lukes, MD [place] [address]   DIAGNOSIS: Cancer Staging Malignant neoplasm of upper-outer quadrant of left female breast [facility]) Staging form: Breast, AJCC 7th Edition - Clinical stage from 05/22/2016: Stage IA (T1b, N0, M0) - Unsigned Staging comments: Staged at breast conference on 11.29.17 - Pathologic: Stage IIA (T1c, N1, cM0) - Unsigned   SUMMARY OF ONCOLOGIC HISTORY:   Malignant neoplasm of upper-outer quadrant of left female breast ([place])   05/13/2016 Initial Diagnosis    Left breast biopsy 3:00: IDC, grade 3, ER 30%, PR 0%, Ki-67 40%, HER-2 positive ratio 2.71, screening detected left breast mass 9 x 7 x 6 mm, axilla negative, T1 BN 0 stage IA clinical stage      06/21/2016 Surgery    Left lumpectomy: IDC grade 3, 1.1 cm, 1/5 LN Positive with ECE, Margins Neg, LVI Present; Er 30%, PR 0%, Ki 67 40%, Her 2 Positive Ratio 2.71; T1CN1 (Stage 2B)      07/25/2016 - 10/10/2016 Chemotherapy    Taxol weekly 12; Herceptin and Perjeta every 3 weeks       10/31/2016 - 11/28/2016 Radiation Therapy    Adjuvant radiation therapy      12/2016 -  Anti-estrogen oral therapy    Anastrozole daily       CURRENT THERAPY: Anastrozole daily  INTERVAL HISTORY: [name] 66 y.o. female returns for urgent evaluation due to a lesion on her breast noted at physical therapy today.  She was sent over here for evaluation.  She says that she had a place on her breast that was going away, but now the bump has enlarged and is warm and tender too.  She denies fevers, chills.  PT did not do therapy today due to the lesion.    Patient Active Problem List   Diagnosis Date Noted  . Asthmatic bronchitis, mild intermittent, uncomplicated 33/00/7622  . Breast cancer ([place]) 02/17/2017  . Dysuria 02/17/2017  . Insomnia 10/08/2016  . Dilated aortic root ([place])   . Skin rash 08/08/2016  . Port catheter in place  08/01/2016  . Malignant neoplasm of upper-outer quadrant of left female breast ([place]) 05/14/2016  . PVC's (premature ventricular contractions) 05/02/2016  . Neck mass 04/21/2016  . LVH (left ventricular hypertrophy) due to hypertensive disease, with heart failure ([place]) 04/18/2016  . Tonsillar hypertrophy 07/07/2015  . Back pain 10/02/2014  . Obesity 07/11/2014  . S/P right TKA 05/10/2014  . S/P knee replacement 05/10/2014  . Atrial tachycardia, paroxysmal ([place]) 02/22/2014  . Type 2 diabetes mellitus with hyperglycemia ([place]) 12/31/2013  . Acute sinusitis with symptoms > 10 days 12/20/2013  . Chronic diastolic CHF (congestive heart failure) ([place]) 11/22/2013  . CAD (coronary artery disease), native coronary artery 11/22/2013  . Dyspnea on exertion 11/03/2013  . Undiagnosed cardiac murmurs 09/05/2013  . Preventative health care 09/05/2013  . Tinea corporis 02/23/2013  . Musculoskeletal pain 11/10/2012  . Vasomotor rhinitis 04/05/2012  . Obstructive sleep apnea 04/05/2012  . Anemia 04/05/2012  . Elevated LFTs 04/05/2012  . OA (osteoarthritis) of knee 04/05/2012  . Hernia 10/13/2010  . Hyperlipidemia 09/11/2007  . Anxiety and depression 09/11/2007  . Essential hypertension 09/11/2007  . GERD 09/11/2007  . RENAL CALCULUS 09/11/2007  . RENAL CYST 09/11/2007  . COLONIC POLYPS, HX OF 09/11/2007    is allergic to no known allergies.  MEDICAL HISTORY: Past Medical History:  Diagnosis Date  .  Anemia 04/05/2012  . Anxiety   . Anxiety state 09/11/2007   Qualifier: Diagnosis of  By: Scherrie Gerlach    . Atrial tachycardia, paroxysmal ([place])   . Back pain 10/02/2014  . Breast cancer ([place]) 02/17/2017  . Chicken pox as a child  . Chronic diastolic CHF (congestive heart failure), NYHA class 1 ([place])   . Complication of anesthesia    pt states feels different in her body after anesthesia when waking up and also experiences a smell of burnt plastic for approx a wk   . Dilated aortic root ([place])     36m by echo 09/2016  . Dysuria 02/17/2017  . Elevated LFTs 04/05/2012  . GERD (gastroesophageal reflux disease)   . Goiter   . Heart murmur    hx of one at birth   . History of kidney stones   . History of radiation therapy 10/31/16-12/17/16   left breast 45 Gy in 25 fractions, left breast boost 16 Gy in 8 fractions  . Hx of colonic polyps   . Hyperlipidemia   . Hypertension   . Insomnia 10/08/2016  . Malignant neoplasm of upper-outer quadrant of left female breast ([place] 05/14/2016   not with patient  . Measles as a child  . Mumps as a child  . OA (osteoarthritis) of knee 04/05/2012  . Obesity 07/11/2014  . PONV (postoperative nausea and vomiting)   . Preventative health care 09/05/2013  . PVC's (premature ventricular contractions) 05/02/2016  . Shortness of breath dyspnea    walking distances / climbing stairs  . Sleep apnea 04/05/2012  . Tinea corporis 02/23/2013  . Type 2 diabetes mellitus with hyperglycemia ([place] 12/31/2013  . Vasomotor rhinitis 04/05/2012  . Ventral hernia     SURGICAL HISTORY: Past Surgical History:  Procedure Laterality Date  . CARDIAC CATHETERIZATION     normal coroary arteries per patient  . CARDIOVASCULAR STRESS TEST     10/12/2013  . CESAREAN SECTION     X 3  . CHOLECYSTECTOMY    . HERNIA REPAIR  02/08/11   ventral hernia  . JOINT REPLACEMENT     bilateral  . KNEE ARTHROSCOPY  05/2010   bilateral  . MENISCUS REPAIR  2009  . MOUTH SURGERY     teeth implants  . TOTAL KNEE ARTHROPLASTY  2011   left  . TUBAL LIGATION    . WISDOM TOOTH EXTRACTION  2000    SOCIAL HISTORY: Social History   Socioeconomic History  . Marital status: Divorced    Spouse name: Not on file  . Number of children: 3  . Years of education: Not on file  . Highest education level: Not on file  Social Needs  . Financial resource strain: Not on file  . Food insecurity - worry: Not on file  . Food insecurity - inability: Not on file  . Transportation needs -  medical: Not on file  . Transportation needs - non-medical: Not on file  Occupational History  . Occupation: retired - [employer]  Tobacco Use  . Smoking status: Never Smoker  . Smokeless tobacco: Never Used  Substance and Sexual Activity  . Alcohol use: Yes    Comment: rarely  . Drug use: No  . Sexual activity: No    Comment: lives with mother currently-stress  Other Topics Concern  . Not on file  Social History Narrative  . Not on file    FAMILY HISTORY: Family History  Problem Relation Age of Onset  . Thyroid  disease Mother   . Hyperlipidemia Mother   . Heart attack Father 55  . Hypertension Father   . Arthritis Father        RA  . Coronary artery disease Father   . Coronary artery disease Brother   . Heart disease Brother   . Cancer Maternal Aunt        colon  . Cancer Maternal Grandmother        colon  . Heart attack Maternal Grandfather   . Alcohol abuse Paternal Grandfather     Review of Systems  Constitutional: Negative for appetite change, chills, fatigue and fever.  HENT:   Negative for hearing loss, lump/mass and mouth sores.   Respiratory: Negative for chest tightness, cough and shortness of breath.   Cardiovascular: Negative for chest pain, leg swelling and palpitations.  Gastrointestinal: Negative for abdominal distention and abdominal pain.  Skin: Negative for itching and rash.  Hematological: Negative for adenopathy.      PHYSICAL EXAMINATION  ECOG PERFORMANCE STATUS: 1 - Symptomatic but completely ambulatory  Vitals:   04/29/17 1421  BP: 124/73  Pulse: 80  Resp: 17  Temp: 98.4 F (36.9 C)  SpO2: 96%    Physical Exam  Constitutional: She is oriented to person, place, and time and well-developed, well-nourished, and in no distress.  HENT:  Head: Normocephalic and atraumatic.  Mouth/Throat: No oropharyngeal exudate.  Eyes: No scleral icterus.  Cardiovascular: Normal rate, regular rhythm and normal heart sounds.  Neurological: She  is alert and oriented to person, place, and time.  Skin: Skin is warm and dry. No rash noted.  Left breast lesion: about 1.5 cm in diameter, with surrounding erythema, slightly fluctuant, warm, tender, and swollen.    Psychiatric: Mood and affect normal.    LABORATORY DATA:  CBC    Component Value Date/Time   WBC 6.4 04/11/2017 0942   WBC 7.9 02/17/2017 0855   RBC 4.79 04/11/2017 0942   RBC 4.76 02/17/2017 0855   HGB 12.2 04/11/2017 0942   HCT 39.1 04/11/2017 0942   PLT 240 04/11/2017 0942   MCV 81.6 04/11/2017 0942   MCH 25.4 04/11/2017 0942   MCH 25.8 (L) 01/22/2017 0954   MCHC 31.2 (L) 04/11/2017 0942   MCHC 31.2 02/17/2017 0855   RDW 19.2 (H) 04/11/2017 0942   LYMPHSABS 1.4 04/11/2017 0942   MONOABS 0.3 04/11/2017 0942   EOSABS 0.1 04/11/2017 0942   BASOSABS 0.0 04/11/2017 0942    CMP     Component Value Date/Time   NA 145 04/11/2017 0942   K 4.4 04/11/2017 0942   CL 102 02/17/2017 0855   CO2 26 04/11/2017 0942   GLUCOSE 156 (H) 04/11/2017 0942   BUN 15.1 04/11/2017 0942   CREATININE 0.8 04/11/2017 0942   CALCIUM 10.0 04/11/2017 0942   PROT 7.5 04/11/2017 0942   ALBUMIN 3.6 04/11/2017 0942   AST 41 (H) 04/11/2017 0942   ALT 40 04/11/2017 0942   ALKPHOS 93 04/11/2017 0942   BILITOT 0.48 04/11/2017 0942   GFRNONAA >60 01/22/2017 0954   GFRAA >60 01/22/2017 0954          ASSESSMENT and PLAN:   Malignant neoplasm of upper-outer quadrant of left female breast ([place]) 06/21/16: Left lumpectomy: IDC grade 3, 1.1 cm, 1/5 LN Positive with ECE, Margins Neg, LVI Present; Er 30%, PR 0%, Ki 67 40%, Her 2 Positive Ratio 2.71; T1CN1 (Stage 2B)  Patient has multiple comorbidities including history of LVH with congestive heart failure  Treatment  Plan: 1. Adjuvant Taxol with Herceptin And Perjeta (Last echocardiogram showed an ejection fraction of 55-60%-- repeat on 4/3/2018EF 60-65%) completed 01/09/2017 2. Followed by adjuvant radiation 3. Followed by adjuvant  antiestrogen therapy with anastrozole 1 mg daily 5 years (bone density 04/29/2016 T score -1.2) ----------------------------------------------------------------------------------------------------------------------------------------------------- Current Treatment: Anastrozole 1 mg daily started 04/29/2016  Started Doxycycline 100 mg po bid for her left breast.  She was instructed to apply warm compresses to the area BID.  She has f/u with Dr. Dalbert Batman soon as well (it may need to be lanced).  She knows to call if it worsens in any way.        All questions were answered. The patient knows to call the clinic with any problems, questions or concerns. We can certainly see the patient much sooner if necessary.  A total of (30) minutes of face-to-face time was spent with this patient with greater than 50% of that time in counseling and care-coordination.  This note was electronically signed. Scot Dock, NP 04/29/2017

## 2017-05-02 ENCOUNTER — Telehealth: Payer: Self-pay | Admitting: Adult Health

## 2017-05-02 NOTE — Telephone Encounter (Signed)
Per 11/6 - no los at checkout °

## 2017-05-06 ENCOUNTER — Encounter: Payer: Medicare HMO | Admitting: Physical Therapy

## 2017-05-06 ENCOUNTER — Telehealth: Payer: Self-pay | Admitting: *Deleted

## 2017-05-06 DIAGNOSIS — Z853 Personal history of malignant neoplasm of breast: Secondary | ICD-10-CM | POA: Diagnosis not present

## 2017-05-06 DIAGNOSIS — C50912 Malignant neoplasm of unspecified site of left female breast: Secondary | ICD-10-CM | POA: Diagnosis not present

## 2017-05-06 NOTE — Telephone Encounter (Signed)
Pt states she has not been able to restart silver sneakers yet. Pt states recently found a boil on her breast which has brought her much discomfort. Pt states she is being treated with abx per her oncology provider. Pt states she has no questions or needs from me at this time.

## 2017-05-07 ENCOUNTER — Other Ambulatory Visit: Payer: Self-pay

## 2017-05-07 NOTE — Patient Outreach (Addendum)
+  [facility]) Care Management  05/07/2017  JOZELYN KUWAHARA [birth year] [id number]   RN Health Coach called and spoke with the patient for an introduction.  I explained to the patient about our initial call and what to expect.  The patient stated that she is very interested in the service to help her reduce her a1c.  Plan: RN Health Coach will call the patient in the month of November. RN Health Coach will send a welcome letter.  Lazaro Arms RN, BSN, [place] Direct Dial:  [phone] Fax: [phone]

## 2017-05-07 NOTE — Patient Outreach (Signed)
[facility]) Care Management  05/07/2017  [name] 1950/12/17 [id number]   Referral Date: 05/07/17 Referral Source: [employer] Report Referral Reason: High Cost   Outreach Attempt: #1 Spoke with patient.  She is able to verify HIPAA.  Patient reports she is doing fairly well.     Social: Patient lives alone but has 3 daughters who are very supportive of her and her needs.    Conditions: Patient admits to breast cancer but now is out of treatment but is in the process of therapy due to lymphedema.  However, patient reports that she had to stop due to a boil on her breast.  She reports that she is on an antibiotic and area looks better today.  Patient states that she is to contact physician if no improvement.  Patient to go to therapy on tomorrow and states she will try to go depending how her boil looks.  Patient also admits to heart failure, DM type 2 and HTN.  Patient weighs at least once a week and saw the heart doctor recently and things were good according to patient. Patient does admit however her last A1c was 7.3 and that she has not followed her diet as she should.    Medications:  Patient takes her medications as ordered and denies any concerns at this time.   Appointments: Patient drives herself to appointments and sees her doctors regularly. Patient scheduled for a mammogram on 05-12-17 and to see the surgeon after her mammogram.     Advanced Directives: Patient does have an advanced directive but states her physician does not have a copy and will be taking one to the office.     Consent: Discussed with patient [place] [facility] and is agreeable to health coach for disease management and support for her diabetes.     Plan: RN CM will refer to health coach for disease management. RN CM will sign off case at this time.     Jone Baseman, RN, MSN [facility] Care Management Care Management Coordinator Direct Line [phone] Toll Free: [phone]   Fax: [phone]

## 2017-05-08 ENCOUNTER — Encounter: Payer: Self-pay | Admitting: Physical Therapy

## 2017-05-08 ENCOUNTER — Ambulatory Visit: Payer: Medicare HMO | Admitting: Physical Therapy

## 2017-05-08 DIAGNOSIS — R293 Abnormal posture: Secondary | ICD-10-CM

## 2017-05-08 DIAGNOSIS — I890 Lymphedema, not elsewhere classified: Secondary | ICD-10-CM | POA: Diagnosis not present

## 2017-05-08 DIAGNOSIS — M25512 Pain in left shoulder: Secondary | ICD-10-CM

## 2017-05-08 NOTE — Therapy (Signed)
[place], [place], [address] Phone: [phone]   Fax:  [phone]  Physical Therapy Treatment  Patient Details  Name: [name] MRN: [id number] Date of Birth: 02-Nov-1950 Referring Provider: Wilber Bihari    Encounter Date: 05/08/2017    Past Medical History:  Diagnosis Date  . Anemia 04/05/2012  . Anxiety   . Anxiety state 09/11/2007   Qualifier: Diagnosis of  By: Scherrie Gerlach    . Atrial tachycardia, paroxysmal ([place])   . Back pain 10/02/2014  . Breast cancer ([place]) 02/17/2017  . Chicken pox as a child  . Chronic diastolic CHF (congestive heart failure), NYHA class 1 ([place])   . Complication of anesthesia    pt states feels different in her body after anesthesia when waking up and also experiences a smell of burnt plastic for approx a wk   . Dilated aortic root ([place])    24mm by echo 09/2016  . Dysuria 02/17/2017  . Elevated LFTs 04/05/2012  . GERD (gastroesophageal reflux disease)   . Goiter   . Heart murmur    hx of one at birth   . History of kidney stones   . History of radiation therapy 10/31/16-12/17/16   left breast 45 Gy in 25 fractions, left breast boost 16 Gy in 8 fractions  . Hx of colonic polyps   . Hyperlipidemia   . Hypertension   . Insomnia 10/08/2016  . Malignant neoplasm of upper-outer quadrant of left female breast ([place]) 05/14/2016   not with patient  . Measles as a child  . Mumps as a child  . OA (osteoarthritis) of knee 04/05/2012  . Obesity 07/11/2014  . PONV (postoperative nausea and vomiting)   . Preventative health care 09/05/2013  . PVC's (premature ventricular contractions) 05/02/2016  . Shortness of breath dyspnea    walking distances / climbing stairs  . Sleep apnea 04/05/2012  . Tinea corporis 02/23/2013  . Type 2 diabetes mellitus with hyperglycemia ([place]) 12/31/2013  . Vasomotor rhinitis 04/05/2012  . Ventral hernia     Past Surgical History:    Procedure Laterality Date  . BREAST LUMPECTOMY WITH RADIOACTIVE SEED AND SENTINEL LYMPH NODE BIOPSY Left 06/21/2016   Procedure: LEFT BREAST LUMPECTOMY WITH RADIOACTIVE SEED AND SENTINEL LYMPH NODE BIOPSY, INJECT BLUE DYE LEFT BREAST;  Surgeon: Fanny Skates, MD;  Location: [facility];  Service: General;  Laterality: Left;  . CARDIAC CATHETERIZATION     normal coroary arteries per patient  . CARDIOVASCULAR STRESS TEST     10/12/2013  . CESAREAN SECTION     X 3  . CHOLECYSTECTOMY    . COLONOSCOPY WITH PROPOFOL N/A 03/28/2015   Procedure: COLONOSCOPY WITH PROPOFOL;  Surgeon: Juanita Craver, MD;  Location: [facility];  Service: Endoscopy;  Laterality: N/A;  . HERNIA REPAIR  02/08/11   ventral hernia  . JOINT REPLACEMENT     bilateral  . KNEE ARTHROSCOPY  05/2010   bilateral  . LEFT AND RIGHT HEART CATHETERIZATION WITH CORONARY ANGIOGRAM N/A 11/08/2013   Procedure: LEFT AND RIGHT HEART CATHETERIZATION WITH CORONARY ANGIOGRAM;  Surgeon: [name], MD;  Location[facility];  Service: Cardiovascular;  Laterality: N/A;  . MENISCUS REPAIR  2009  . MOUTH SURGERY     teeth implants  . PORT-A-CATH REMOVAL N/A 01/24/2017   Procedure: REMOVAL PORT-A-CATH;  Surgeon: Fanny Skates, MD;  Location: [facility];  Service: General;  Laterality: N/A;  . PORTACATH PLACEMENT Right 07/16/2016   Procedure:  INSERTION PORT-A-CATH RIGHT INTERNAL JUGULAR WITH ULTRASOUND;  Surgeon: Fanny Skates, MD;  Location: [facility];  Service: General;  Laterality: Right;  . TOTAL KNEE ARTHROPLASTY  2011   left  . TOTAL KNEE ARTHROPLASTY Right 05/10/2014   Procedure: RIGHT TOTAL KNEE ARTHROPLASTY;  Surgeon: Mauri Pole, MD;  Location: [facility];  Service: Orthopedics;  Laterality: Right;  . TUBAL LIGATION    . WISDOM TOOTH EXTRACTION  2000    There were no vitals filed for this visit.  Subjective Assessment - 05/08/17 1349    Subjective  Pt has been taking the antiobiotics ( she has 2 more days)  and will have her  mammorgram on Monday.  She feels that the "boil" is not as hard but she still has it. She notices an incraese in stretch in down into her breast when she put her arm over the back of her church pew     Pertinent History  left breast cancer with left lumpectomy 06/21/2016 with 1/5 lymph nodes positive, chemo from 2/1-2/19/ 2018, Radiation from 5/10- 6/7/ 2018, currently on anastrazole     Patient Stated Goals  to get rid of the hard ridge in my left breast     Currently in Pain?  No/denies                         [facility] Adult PT Treatment/Exercise - 05/08/17 0001      Shoulder Exercises: Standing   Row  Strengthening;Right;Left;10 reps;Theraband    Theraband Level (Shoulder Row)  Level 2 (Red)      Shoulder Exercises: Pulleys   Flexion  2 minutes    ABduction  2 minutes      Shoulder Exercises: Stretch   Wall Stretch - Flexion  5 reps      Manual Therapy   Manual Therapy  Passive ROM    Passive ROM  to left shoulder at end ranges of flexion, abduction and external rotation                          Plan - 05/08/17 1430    Clinical Impression Statement  Pt has been taking her antibiotics, but still has the wound lesion on left breast,  It appears to be darker red and the surface is thin and fluctuant under it.  Concern that is might open up during mammogram on Monday.  Recommended pt return to [facility] assessment of lesion before mammogram.  Continued with shoulder exercise but did not do MLD to breast due to cellulits still on antibiotics     Rehab Potential  Good    PT Frequency  2x / week    PT Duration  4 weeks    PT Next Visit Plan   Assess skin.  If better, continue perform and teach manual lymph drainage, progress dowel rod exercise for shoulder range of motion, continue assess foam patch Later, progress shoulder strengthening.         Patient will benefit from skilled therapeutic intervention in order to improve the following deficits and  impairments:     Visit Diagnosis: Abnormal posture  Lymphedema, not elsewhere classified  Acute pain of left shoulder     Problem List Patient Active Problem List   Diagnosis Date Noted  . Asthmatic bronchitis, mild intermittent, uncomplicated 23/76/2831  . Breast cancer ([place]) 02/17/2017  . Dysuria 02/17/2017  . Insomnia 10/08/2016  . Dilated aortic root ([place])   .  Skin rash 08/08/2016  . Port catheter in place 08/01/2016  . Malignant neoplasm of upper-outer quadrant of left female breast ([place]) 05/14/2016  . PVC's (premature ventricular contractions) 05/02/2016  . Neck mass 04/21/2016  . LVH (left ventricular hypertrophy) due to hypertensive disease, with heart failure ([place]) 04/18/2016  . Tonsillar hypertrophy 07/07/2015  . Back pain 10/02/2014  . Obesity 07/11/2014  . S/P right TKA 05/10/2014  . S/P knee replacement 05/10/2014  . Atrial tachycardia, paroxysmal ([place]) 02/22/2014  . Type 2 diabetes mellitus with hyperglycemia ([place]) 12/31/2013  . Acute sinusitis with symptoms > 10 days 12/20/2013  . Chronic diastolic CHF (congestive heart failure) ([place]) 11/22/2013  . CAD (coronary artery disease), native coronary artery 11/22/2013  . Dyspnea on exertion 11/03/2013  . Undiagnosed cardiac murmurs 09/05/2013  . Preventative health care 09/05/2013  . Tinea corporis 02/23/2013  . Musculoskeletal pain 11/10/2012  . Vasomotor rhinitis 04/05/2012  . Obstructive sleep apnea 04/05/2012  . Anemia 04/05/2012  . Elevated LFTs 04/05/2012  . OA (osteoarthritis) of knee 04/05/2012  . Hernia 10/13/2010  . Hyperlipidemia 09/11/2007  . Anxiety and depression 09/11/2007  . Essential hypertension 09/11/2007  . GERD 09/11/2007  . RENAL CALCULUS 09/11/2007  . RENAL CYST 09/11/2007  . COLONIC POLYPS, HX OF 09/11/2007   Donato Heinz. Owens Shark PT  [place] Levo 05/08/2017, 2:34 PM  [place], [place],  [address] Phone: [phone]   Fax:  [phone]  Name: [name] MRN: [id number] Date of Birth: 12/16/1950

## 2017-05-12 ENCOUNTER — Ambulatory Visit
Admission: RE | Admit: 2017-05-12 | Discharge: 2017-05-12 | Disposition: A | Discharge status: Home / Self Care | Payer: Medicare HMO | Source: Ambulatory Visit | Attending: Family Medicine | Admitting: Family Medicine

## 2017-05-12 DIAGNOSIS — R928 Other abnormal and inconclusive findings on diagnostic imaging of breast: Secondary | ICD-10-CM | POA: Diagnosis not present

## 2017-05-12 DIAGNOSIS — Z853 Personal history of malignant neoplasm of breast: Secondary | ICD-10-CM

## 2017-05-13 ENCOUNTER — Ambulatory Visit: Payer: Medicare HMO

## 2017-05-14 ENCOUNTER — Other Ambulatory Visit: Payer: Self-pay | Admitting: Family Medicine

## 2017-05-20 ENCOUNTER — Encounter: Payer: Self-pay | Admitting: Physical Therapy

## 2017-05-20 ENCOUNTER — Ambulatory Visit: Payer: Medicare HMO | Admitting: Physical Therapy

## 2017-05-20 DIAGNOSIS — R293 Abnormal posture: Secondary | ICD-10-CM

## 2017-05-20 DIAGNOSIS — M25512 Pain in left shoulder: Secondary | ICD-10-CM | POA: Diagnosis not present

## 2017-05-20 DIAGNOSIS — I890 Lymphedema, not elsewhere classified: Secondary | ICD-10-CM | POA: Diagnosis not present

## 2017-05-20 NOTE — Therapy (Signed)
[place], [place], [address] Phone: [phone]   Fax:  [phone]  Physical Therapy Treatment  Patient Details  Name: CHARLEI RAMSARAN MRN: [id number] Date of Birth: 1950/09/30 Referring Provider: Wilber Bihari    Encounter Date: 05/20/2017  PT End of Session - 05/20/17 1243    Visit Number  4    Number of Visits  9    Date for PT Re-Evaluation  05/23/17    PT Start Time  9371    PT Stop Time  1100    PT Time Calculation (min)  45 min    Activity Tolerance  Patient tolerated treatment well    Behavior During Therapy  [facility] for tasks assessed/performed       Past Medical History:  Diagnosis Date  . Anemia 04/05/2012  . Anxiety   . Anxiety state 09/11/2007   Qualifier: Diagnosis of  By: Scherrie Gerlach    . Atrial tachycardia, paroxysmal ([place])   . Back pain 10/02/2014  . Breast cancer ([place]) 02/17/2017  . Chicken pox as a child  . Chronic diastolic CHF (congestive heart failure), NYHA class 1 ([place])   . Complication of anesthesia    pt states feels different in her body after anesthesia when waking up and also experiences a smell of burnt plastic for approx a wk   . Dilated aortic root ([place])    38mm by echo 09/2016  . Dysuria 02/17/2017  . Elevated LFTs 04/05/2012  . GERD (gastroesophageal reflux disease)   . Goiter   . Heart murmur    hx of one at birth   . History of kidney stones   . History of radiation therapy 10/31/16-12/17/16   left breast 45 Gy in 25 fractions, left breast boost 16 Gy in 8 fractions  . Hx of colonic polyps   . Hyperlipidemia   . Hypertension   . Insomnia 10/08/2016  . Malignant neoplasm of upper-outer quadrant of left female breast ([place]) 05/14/2016   not with patient  . Measles as a child  . Mumps as a child  . OA (osteoarthritis) of knee 04/05/2012  . Obesity 07/11/2014  . PONV (postoperative nausea and vomiting)   . Preventative health care 09/05/2013  . PVC's  (premature ventricular contractions) 05/02/2016  . Shortness of breath dyspnea    walking distances / climbing stairs  . Sleep apnea 04/05/2012  . Tinea corporis 02/23/2013  . Type 2 diabetes mellitus with hyperglycemia ([place]) 12/31/2013  . Vasomotor rhinitis 04/05/2012  . Ventral hernia     Past Surgical History:  Procedure Laterality Date  . BREAST LUMPECTOMY WITH RADIOACTIVE SEED AND SENTINEL LYMPH NODE BIOPSY Left 06/21/2016   Procedure: LEFT BREAST LUMPECTOMY WITH RADIOACTIVE SEED AND SENTINEL LYMPH NODE BIOPSY, INJECT BLUE DYE LEFT BREAST;  Surgeon: Fanny Skates, MD;  Location: [place];  Service: General;  Laterality: Left;  . CARDIAC CATHETERIZATION     normal coroary arteries per patient  . CARDIOVASCULAR STRESS TEST     10/12/2013  . CESAREAN SECTION     X 3  . CHOLECYSTECTOMY    . COLONOSCOPY WITH PROPOFOL N/A 03/28/2015   Procedure: COLONOSCOPY WITH PROPOFOL;  Surgeon: Juanita Craver, MD;  [facility];  Service: Endoscopy;  Laterality: N/A;  . HERNIA REPAIR  02/08/11   ventral hernia  . JOINT REPLACEMENT     bilateral  . KNEE ARTHROSCOPY  05/2010   bilateral  . LEFT AND RIGHT HEART CATHETERIZATION WITH CORONARY ANGIOGRAM  N/A 11/08/2013   Procedure: LEFT AND RIGHT HEART CATHETERIZATION WITH CORONARY ANGIOGRAM;  Surgeon: [name], MD;  [facility] CATH LAB;  Service: Cardiovascular;  Laterality: N/A;  . MENISCUS REPAIR  2009  . MOUTH SURGERY     teeth implants  . PORT-A-CATH REMOVAL N/A 01/24/2017   Procedure: REMOVAL PORT-A-CATH;  Surgeon: Fanny Skates, MD;  Location: [place];  Service: General;  Laterality: N/A;  . PORTACATH PLACEMENT Right 07/16/2016   Procedure: INSERTION PORT-A-CATH RIGHT INTERNAL JUGULAR WITH ULTRASOUND;  Surgeon: Fanny Skates, MD;  Location: [place];  Service: General;  Laterality: Right;  . TOTAL KNEE ARTHROPLASTY  2011   left  . TOTAL KNEE ARTHROPLASTY Right 05/10/2014   Procedure: RIGHT TOTAL KNEE ARTHROPLASTY;  Surgeon: Mauri Pole, MD;  [facility];  Service: Orthopedics;  Laterality: Right;  . TUBAL LIGATION    . WISDOM TOOTH EXTRACTION  2000    There were no vitals filed for this visit.  Subjective Assessment - 05/20/17 1021    Subjective  " I went ahead and had my mammogram"  She said it was painful  but she does not have any cancer .  She feels that her breast is back to being hard even though she wears the compression bra and the pad.  She has also had some vertigo and is feeling better from that but had to cancel her appointments because she was so sick     Pertinent History  left breast cancer with left lumpectomy 06/21/2016 with 1/5 lymph nodes positive, chemo from 2/1-2/19/ 2018, Radiation from 5/10- 6/7/ 2018, currently on anastrazole     Patient Stated Goals  to get rid of the hard ridge in my left breast     Currently in Pain?  No/denies                      [facility] Adult PT Treatment/Exercise - 05/20/17 0001      Manual Therapy   Manual Therapy  Edema management;Manual Lymphatic Drainage (MLD)    Manual therapy comments  provided peach lined foam to wear in bra     Manual Lymphatic Drainage (MLD)  in supine with head elevated. Short neck, 5 diaphragmatic breaths, right axillary nodes, anterior interaxillary anastamosis, left inguinal nodes, left axilloinguinal anasatmosis, left breast with upper quadrant going to right axillary nodes, and lower quadrant going toward inguinal nodes.                             Plan - 05/20/17 1244    Clinical Impression Statement  Pt has been having a difficult time these past few weeks with vertigot and is noted to have another small red area at inferior area of breast.  She says her daughter have trouble with these "boils" too.   Encouraged pt to drink more water and return to exercise at the [employer] program.  She wants to focus on self manual lymph drainage to help with the fullness in her breast that is persistent       Clinical Impairments Affecting Rehab Potential  previous radiation and 5 nodes removed     PT Frequency  2x / week    PT Duration  4 weeks    PT Next Visit Plan   Reassess for goals and recert focus on self MLD moving forward and pogress exercise Assess skin.  If better, continue perform and teach manual lymph drainage,  progress dowel rod exercise for shoulder range of motion, continue assess foam patch Later, progress shoulder strengthening.         Patient will benefit from skilled therapeutic intervention in order to improve the following deficits and impairments:  Decreased skin integrity, Obesity, Increased edema, Decreased scar mobility, Decreased knowledge of precautions, Decreased knowledge of use of DME, Increased fascial restricitons, Pain, Decreased range of motion, Postural dysfunction  Visit Diagnosis: Abnormal posture  Lymphedema, not elsewhere classified  Acute pain of left shoulder     Problem List Patient Active Problem List   Diagnosis Date Noted  . Asthmatic bronchitis, mild intermittent, uncomplicated 09/47/0962  . Breast cancer ([place]) 02/17/2017  . Dysuria 02/17/2017  . Insomnia 10/08/2016  . Dilated aortic root ([place])   . Skin rash 08/08/2016  . Port catheter in place 08/01/2016  . Malignant neoplasm of upper-outer quadrant of left female breast ([place]) 05/14/2016  . PVC's (premature ventricular contractions) 05/02/2016  . Neck mass 04/21/2016  . LVH (left ventricular hypertrophy) due to hypertensive disease, with heart failure ([place]) 04/18/2016  . Tonsillar hypertrophy 07/07/2015  . Back pain 10/02/2014  . Obesity 07/11/2014  . S/P right TKA 05/10/2014  . S/P knee replacement 05/10/2014  . Atrial tachycardia, paroxysmal ([place]) 02/22/2014  . Type 2 diabetes mellitus with hyperglycemia ([place]) 12/31/2013  . Acute sinusitis with symptoms > 10 days 12/20/2013  . Chronic diastolic CHF (congestive heart failure) ([place]) 11/22/2013  . CAD (coronary artery disease),  native coronary artery 11/22/2013  . Dyspnea on exertion 11/03/2013  . Undiagnosed cardiac murmurs 09/05/2013  . Preventative health care 09/05/2013  . Tinea corporis 02/23/2013  . Musculoskeletal pain 11/10/2012  . Vasomotor rhinitis 04/05/2012  . Obstructive sleep apnea 04/05/2012  . Anemia 04/05/2012  . Elevated LFTs 04/05/2012  . OA (osteoarthritis) of knee 04/05/2012  . Hernia 10/13/2010  . Hyperlipidemia 09/11/2007  . Anxiety and depression 09/11/2007  . Essential hypertension 09/11/2007  . GERD 09/11/2007  . RENAL CALCULUS 09/11/2007  . RENAL CYST 09/11/2007  . COLONIC POLYPS, HX OF 09/11/2007   Donato Heinz. Owens Shark PT  Norwood Levo 05/20/2017, 12:47 PM  [place], [place], [address] Phone: [phone]   Fax:  [phone]  Name: ADDALINE PEPLINSKI MRN: [id number] Date of Birth: [birth year]

## 2017-05-22 ENCOUNTER — Ambulatory Visit: Payer: Medicare HMO | Admitting: Physical Therapy

## 2017-05-22 ENCOUNTER — Encounter: Payer: Self-pay | Admitting: Family Medicine

## 2017-05-22 ENCOUNTER — Encounter: Payer: Self-pay | Admitting: Physical Therapy

## 2017-05-22 ENCOUNTER — Ambulatory Visit (INDEPENDENT_AMBULATORY_CARE_PROVIDER_SITE_OTHER): Payer: Medicare HMO | Admitting: Family Medicine

## 2017-05-22 ENCOUNTER — Other Ambulatory Visit: Payer: Self-pay

## 2017-05-22 VITALS — BP 128/78 | HR 66 | Temp 98.6°F | Resp 18 | Wt 329.2 lb

## 2017-05-22 DIAGNOSIS — E6609 Other obesity due to excess calories: Secondary | ICD-10-CM

## 2017-05-22 DIAGNOSIS — I10 Essential (primary) hypertension: Secondary | ICD-10-CM

## 2017-05-22 DIAGNOSIS — E118 Type 2 diabetes mellitus with unspecified complications: Secondary | ICD-10-CM | POA: Diagnosis not present

## 2017-05-22 DIAGNOSIS — R293 Abnormal posture: Secondary | ICD-10-CM

## 2017-05-22 DIAGNOSIS — I890 Lymphedema, not elsewhere classified: Secondary | ICD-10-CM | POA: Diagnosis not present

## 2017-05-22 DIAGNOSIS — E1169 Type 2 diabetes mellitus with other specified complication: Secondary | ICD-10-CM | POA: Diagnosis not present

## 2017-05-22 DIAGNOSIS — M25512 Pain in left shoulder: Secondary | ICD-10-CM | POA: Diagnosis not present

## 2017-05-22 DIAGNOSIS — E785 Hyperlipidemia, unspecified: Secondary | ICD-10-CM

## 2017-05-22 DIAGNOSIS — Z23 Encounter for immunization: Secondary | ICD-10-CM

## 2017-05-22 DIAGNOSIS — E1165 Type 2 diabetes mellitus with hyperglycemia: Secondary | ICD-10-CM | POA: Diagnosis not present

## 2017-05-22 DIAGNOSIS — C50919 Malignant neoplasm of unspecified site of unspecified female breast: Secondary | ICD-10-CM

## 2017-05-22 LAB — CBC
HCT: 37.8 % (ref 36.0–46.0)
Hemoglobin: 11.7 g/dL — ABNORMAL LOW (ref 12.0–15.0)
MCHC: 31.1 g/dL (ref 30.0–36.0)
MCV: 82.4 fl (ref 78.0–100.0)
Platelets: 270 10*3/uL (ref 150.0–400.0)
RBC: 4.59 Mil/uL (ref 3.87–5.11)
RDW: 17.2 % — AB (ref 11.5–15.5)
WBC: 7.6 10*3/uL (ref 4.0–10.5)

## 2017-05-22 LAB — COMPREHENSIVE METABOLIC PANEL
ALBUMIN: 3.8 g/dL (ref 3.5–5.2)
ALT: 35 U/L (ref 0–35)
AST: 39 U/L — AB (ref 0–37)
Alkaline Phosphatase: 82 U/L (ref 39–117)
BILIRUBIN TOTAL: 0.5 mg/dL (ref 0.2–1.2)
BUN: 17 mg/dL (ref 6–23)
CALCIUM: 9.8 mg/dL (ref 8.4–10.5)
CO2: 28 mEq/L (ref 19–32)
CREATININE: 0.76 mg/dL (ref 0.40–1.20)
Chloride: 102 mEq/L (ref 96–112)
GFR: 80.8 mL/min (ref >60.00)
Glucose, Bld: 171 mg/dL — ABNORMAL HIGH (ref 70–99)
Potassium: 4.3 mEq/L (ref 3.5–5.1)
Sodium: 139 mEq/L (ref 135–145)
Total Protein: 7.3 g/dL (ref 6.0–8.3)

## 2017-05-22 LAB — LIPID PANEL
CHOL/HDL RATIO: 4
CHOLESTEROL: 120 mg/dL (ref 0–200)
HDL: 32.8 mg/dL — ABNORMAL LOW (ref >39.00)
LDL CALC: 58 mg/dL (ref 0–99)
NONHDL: 87.06
Triglycerides: 146 mg/dL (ref 0.0–149.0)
VLDL: 29.2 mg/dL (ref 0.0–40.0)

## 2017-05-22 LAB — HEMOGLOBIN A1C: HEMOGLOBIN A1C: 7.2 % — AB (ref 4.6–6.5)

## 2017-05-22 LAB — TSH: TSH: 1.31 u[IU]/mL (ref 0.35–4.50)

## 2017-05-22 MED ORDER — ESOMEPRAZOLE MAGNESIUM 40 MG PO CPDR: 40.0000 mg | DELAYED_RELEASE_CAPSULE | Freq: Every day | ORAL | 2 refills | Status: DC

## 2017-05-22 MED ORDER — DILTIAZEM HCL ER COATED BEADS 120 MG PO CP24: 120.0000 mg | ORAL_CAPSULE | Freq: Every day | ORAL | 2 refills | Status: DC

## 2017-05-22 MED ORDER — FUROSEMIDE 20 MG PO TABS: 20.0000 mg | ORAL_TABLET | Freq: Every day | ORAL | 2 refills | Status: DC

## 2017-05-22 MED ORDER — METFORMIN HCL 500 MG PO TABS: 500.0000 mg | ORAL_TABLET | Freq: Two times a day (BID) | ORAL | 3 refills | Status: DC

## 2017-05-22 MED ORDER — MONTELUKAST SODIUM 10 MG PO TABS: 10.0000 mg | ORAL_TABLET | Freq: Every day | ORAL | 2 refills | Status: DC

## 2017-05-22 MED ORDER — DICLOFENAC SODIUM 75 MG PO TBEC: 75.0000 mg | DELAYED_RELEASE_TABLET | Freq: Two times a day (BID) | ORAL | 1 refills | Status: DC

## 2017-05-22 MED ORDER — LOSARTAN POTASSIUM 100 MG PO TABS: 100.0000 mg | ORAL_TABLET | Freq: Every day | ORAL | 2 refills | Status: DC

## 2017-05-22 MED ORDER — ANASTROZOLE 1 MG PO TABS: 1.0000 mg | ORAL_TABLET | Freq: Every day | ORAL | 3 refills | Status: DC

## 2017-05-22 MED ORDER — NEBIVOLOL HCL 10 MG PO TABS: 10.0000 mg | ORAL_TABLET | Freq: Every day | ORAL | 2 refills | Status: DC

## 2017-05-22 MED ORDER — ESCITALOPRAM OXALATE 20 MG PO TABS: 20.0000 mg | ORAL_TABLET | Freq: Every day | ORAL | 1 refills | Status: DC

## 2017-05-22 MED ORDER — ROSUVASTATIN CALCIUM 40 MG PO TABS: 40.0000 mg | ORAL_TABLET | Freq: Every day | ORAL | 3 refills | Status: DC

## 2017-05-22 MED ORDER — EZETIMIBE 10 MG PO TABS: 10.0000 mg | ORAL_TABLET | Freq: Every day | ORAL | 3 refills | Status: DC

## 2017-05-22 NOTE — Patient Instructions (Addendum)
Shingrix is the new shingles shot, 2 shots over a 2-6 month window can get a pharmacy, wait 30 days to get shot Hypertension Hypertension is another name for high blood pressure. High blood pressure forces your heart to work harder to pump blood. This can cause problems over time. There are two numbers in a blood pressure reading. There is a top number (systolic) over a bottom number (diastolic). It is best to have a blood pressure below 120/80. Healthy choices can help lower your blood pressure. You may need medicine to help lower your blood pressure if:  Your blood pressure cannot be lowered with healthy choices.  Your blood pressure is higher than 130/80.  Follow these instructions at home: Eating and drinking  If directed, follow the DASH eating plan. This diet includes: ? Filling half of your plate at each meal with fruits and vegetables. ? Filling one quarter of your plate at each meal with whole grains. Whole grains include whole wheat pasta, brown rice, and whole grain bread. ? Eating or drinking low-fat dairy products, such as skim milk or low-fat yogurt. ? Filling one quarter of your plate at each meal with low-fat (lean) proteins. Low-fat proteins include fish, skinless chicken, eggs, beans, and tofu. ? Avoiding fatty meat, cured and processed meat, or chicken with skin. ? Avoiding premade or processed food.  Eat less than 1,500 mg of salt (sodium) a day.  Limit alcohol use to no more than 1 drink a day for nonpregnant women and 2 drinks a day for men. One drink equals 12 oz of beer, 5 oz of wine, or 1 oz of hard liquor. Lifestyle  Work with your doctor to stay at a healthy weight or to lose weight. Ask your doctor what the best weight is for you.  Get at least 30 minutes of exercise that causes your heart to beat faster (aerobic exercise) most days of the week. This may include walking, swimming, or biking.  Get at least 30 minutes of exercise that strengthens your muscles  (resistance exercise) at least 3 days a week. This may include lifting weights or pilates.  Do not use any products that contain nicotine or tobacco. This includes cigarettes and e-cigarettes. If you need help quitting, ask your doctor.  Check your blood pressure at home as told by your doctor.  Keep all follow-up visits as told by your doctor. This is important. Medicines  Take over-the-counter and prescription medicines only as told by your doctor. Follow directions carefully.  Do not skip doses of blood pressure medicine. The medicine does not work as well if you skip doses. Skipping doses also puts you at risk for problems.  Ask your doctor about side effects or reactions to medicines that you should watch for. Contact a doctor if:  You think you are having a reaction to the medicine you are taking.  You have headaches that keep coming back (recurring).  You feel dizzy.  You have swelling in your ankles.  You have trouble with your vision. Get help right away if:  You get a very bad headache.  You start to feel confused.  You feel weak or numb.  You feel faint.  You get very bad pain in your: ? Chest. ? Belly (abdomen).  You throw up (vomit) more than once.  You have trouble breathing. Summary  Hypertension is another name for high blood pressure.  Making healthy choices can help lower blood pressure. If your blood pressure cannot be controlled with  healthy choices, you may need to take medicine. This information is not intended to replace advice given to you by your health care provider. Make sure you discuss any questions you have with your health care provider. Document Released: 11/27/2007 Document Revised: 05/08/2016 Document Reviewed: 05/08/2016 Elsevier Interactive Patient Education  [name].

## 2017-05-22 NOTE — Assessment & Plan Note (Signed)
On left s/p lumpectomy and recent mgm was negative for cancer.

## 2017-05-22 NOTE — Assessment & Plan Note (Signed)
[id number] acceptable, minimize simple carbs. Increase exercise as tolerated. Continue current meds 

## 2017-05-22 NOTE — Therapy (Signed)
[place], [place], [address] Phone: [phone]   Fax:  [phone]  Physical Therapy Treatment  Patient Details  Name: [name] MRN: [id number] Date of Birth: 1950-08-30 Referring Provider: Wilber Bihari    Encounter Date: 05/22/2017  PT End of Session - 05/22/17 1241    Visit Number  6    Number of Visits  17    Date for PT Re-Evaluation  06/23/17    PT Start Time  6144    PT Stop Time  1100    PT Time Calculation (min)  45 min    Activity Tolerance  Patient tolerated treatment well    Behavior During Therapy  [facility] for tasks assessed/performed       Past Medical History:  Diagnosis Date  . Anemia 04/05/2012  . Anxiety   . Anxiety state 09/11/2007   Qualifier: Diagnosis of  By: Scherrie Gerlach    . Atrial tachycardia, paroxysmal ([place])   . Back pain 10/02/2014  . Breast cancer ([place]) 02/17/2017  . Chicken pox as a child  . Chronic diastolic CHF (congestive heart failure), NYHA class 1 ([place])   . Complication of anesthesia    pt states feels different in her body after anesthesia when waking up and also experiences a smell of burnt plastic for approx a wk   . Dilated aortic root ([place])    38mm by echo 09/2016  . Dysuria 02/17/2017  . Elevated LFTs 04/05/2012  . GERD (gastroesophageal reflux disease)   . Goiter   . Heart murmur    hx of one at birth   . History of kidney stones   . History of radiation therapy 10/31/16-12/17/16   left breast 45 Gy in 25 fractions, left breast boost 16 Gy in 8 fractions  . Hx of colonic polyps   . Hyperlipidemia   . Hypertension   . Insomnia 10/08/2016  . Malignant neoplasm of upper-outer quadrant of left female breast ([place]) 05/14/2016   not with patient  . Measles as a child  . Mumps as a child  . OA (osteoarthritis) of knee 04/05/2012  . Obesity 07/11/2014  . PONV (postoperative nausea and vomiting)   . Preventative health care 09/05/2013  . PVC's  (premature ventricular contractions) 05/02/2016  . Shortness of breath dyspnea    walking distances / climbing stairs  . Sleep apnea 04/05/2012  . Tinea corporis 02/23/2013  . Type 2 diabetes mellitus with hyperglycemia ([place]) 12/31/2013  . Vasomotor rhinitis 04/05/2012  . Ventral hernia     Past Surgical History:  Procedure Laterality Date  . BREAST LUMPECTOMY WITH RADIOACTIVE SEED AND SENTINEL LYMPH NODE BIOPSY Left 06/21/2016   Procedure: LEFT BREAST LUMPECTOMY WITH RADIOACTIVE SEED AND SENTINEL LYMPH NODE BIOPSY, INJECT BLUE DYE LEFT BREAST;  Surgeon: Fanny Skates, MD;  Location: [place];  Service: General;  Laterality: Left;  . CARDIAC CATHETERIZATION     normal coroary arteries per patient  . CARDIOVASCULAR STRESS TEST     10/12/2013  . CESAREAN SECTION     X 3  . CHOLECYSTECTOMY    . COLONOSCOPY WITH PROPOFOL N/A 03/28/2015   Procedure: COLONOSCOPY WITH PROPOFOL;  Surgeon: Juanita Craver, MD;  [facility];  Service: Endoscopy;  Laterality: N/A;  . HERNIA REPAIR  02/08/11   ventral hernia  . JOINT REPLACEMENT     bilateral  . KNEE ARTHROSCOPY  05/2010   bilateral  . LEFT AND RIGHT HEART CATHETERIZATION WITH CORONARY ANGIOGRAM  N/A 11/08/2013   Procedure: LEFT AND RIGHT HEART CATHETERIZATION WITH CORONARY ANGIOGRAM;  Surgeon: [name], MD;  Location: [facility] CATH LAB;  Service: Cardiovascular;  Laterality: N/A;  . MENISCUS REPAIR  2009  . MOUTH SURGERY     teeth implants  . PORT-A-CATH REMOVAL N/A 01/24/2017   Procedure: REMOVAL PORT-A-CATH;  Surgeon: Fanny Skates, MD;  Location: [place];  Service: General;  Laterality: N/A;  . PORTACATH PLACEMENT Right 07/16/2016   Procedure: INSERTION PORT-A-CATH RIGHT INTERNAL JUGULAR WITH ULTRASOUND;  Surgeon: Fanny Skates, MD;  Location: [place];  Service: General;  Laterality: Right;  . TOTAL KNEE ARTHROPLASTY  2011   left  . TOTAL KNEE ARTHROPLASTY Right 05/10/2014   Procedure: RIGHT TOTAL KNEE ARTHROPLASTY;  Surgeon: Mauri Pole, MD;  [facility];  Service: Orthopedics;  Laterality: Right;  . TUBAL LIGATION    . WISDOM TOOTH EXTRACTION  2000    There were no vitals filed for this visit.  Subjective Assessment - 05/22/17 1018    Subjective  Pt got her pneumonia shot today .  She will be getting her shingles shot soon. She felt that the upgraded foam patch she has been been wearing in her bra has helped some with breast swelling     Pertinent History  left breast cancer with left lumpectomy 06/21/2016 with 1/5 lymph nodes positive, chemo from 2/1-2/19/ 2018, Radiation from 5/10- 6/7/ 2018, currently on anastrazole     Patient Stated Goals  to get rid of the hard ridge in my left breast     Currently in Pain?  No/denies         [facility] PT Assessment - 05/22/17 0001      Assessment   Medical Diagnosis  left breast cancer     Referring Provider  Wilber Bihari     Onset Date/Surgical Date  07/18/16      Prior Function   Level of Independence  Independent                  [facility] Adult PT Treatment/Exercise - 05/22/17 0001      Exercises   Exercises  Neck      Neck Exercises: Seated   Other Seated Exercise  neck range of motion and shoulder circles       Shoulder Exercises: Sidelying   ABduction  AROM;Left;5 reps    Other Sidelying Exercises  small circles with hand pointed to ceiling in each direction     Other Sidelying Exercises  horizontal abduction  with stretching as pt fells pulling down into breast       Manual Therapy   Manual Therapy  Edema management;Manual Lymphatic Drainage (MLD)    Manual Lymphatic Drainage (MLD)  in supine with head elevated. Short neck, 5 diaphragmatic breaths, right axillary nodes, anterior interaxillary anastamosis, left inguinal nodes, left axilloinguinal anasatmosis, left breast with upper quadrant going to right axillary nodes, and lower quadrant going toward inguinal nodes.  used handout and hand over hand technique and frequent cues to decrease  pressure and speed. Then pt to sidelying for lateral chest and posterior interaxillary anastamosis                      Long Term Clinic Goals - 05/22/17 1100      CC Long Term Goal  #1   Title  Pt will decreased her breast lymphedema by 50%     Baseline  05/22/2017 Pt has decreased her lymphedema by  25%     Time  4    Period  Weeks    Status  On-going      CC Long Term Goal  #2   Title  Pt will be independent in a home exercise program for shoulder range of motion and strength     Status  Achieved      CC Long Term Goal  #3   Title  Pt will be able to use compression, self manual lymph drainage and remedial exercise  to manage breast lymphedema at home.     Time  4    Period  Weeks    Status  On-going         Plan - 05/22/17 1245    Clinical Impression Statement  Pt has had her treatment course complicated by cellulitis in the breast and an episode of vertigo that is still resolving. She was able to tolerate her mammogram and has made some progress but wants to continue PT to reduces the swelling in her breast .    Rehab Potential  Good    Clinical Impairments Affecting Rehab Potential  previous radiation and 5 nodes removed     PT Frequency  2x / week    PT Duration  4 weeks    PT Next Visit Plan  check to make sure that pt is doing exercise at home and is able to do self MLD  concentrate on MLD to lateral chest and back.        Patient will benefit from skilled therapeutic intervention in order to improve the following deficits and impairments:  Decreased skin integrity, Obesity, Increased edema, Decreased scar mobility, Decreased knowledge of precautions, Decreased knowledge of use of DME, Increased fascial restricitons, Pain, Decreased range of motion, Postural dysfunction  Visit Diagnosis: Abnormal posture - Plan: PT plan of care cert/re-cert  Lymphedema, not elsewhere classified - Plan: PT plan of care cert/re-cert  Acute pain of left shoulder - Plan:  PT plan of care cert/re-cert     Problem List Patient Active Problem List   Diagnosis Date Noted  . Lymphedema 05/22/2017  . Asthmatic bronchitis, mild intermittent, uncomplicated 92/06/69  . Breast cancer ([place]) 02/17/2017  . Dysuria 02/17/2017  . Insomnia 10/08/2016  . Dilated aortic root ([place])   . Skin rash 08/08/2016  . Port catheter in place 08/01/2016  . Malignant neoplasm of upper-outer quadrant of left female breast ([place]) 05/14/2016  . PVC's (premature ventricular contractions) 05/02/2016  . Neck mass 04/21/2016  . LVH (left ventricular hypertrophy) due to hypertensive disease, with heart failure ([place]) 04/18/2016  . Tonsillar hypertrophy 07/07/2015  . Back pain 10/02/2014  . Obesity 07/11/2014  . S/P right TKA 05/10/2014  . S/P knee replacement 05/10/2014  . Atrial tachycardia, paroxysmal ([place]) 02/22/2014  . Type 2 diabetes mellitus with hyperglycemia ([place]) 12/31/2013  . Acute sinusitis with symptoms > 10 days 12/20/2013  . Chronic diastolic CHF (congestive heart failure) ([name]) 11/22/2013  . CAD (coronary artery disease), native coronary artery 11/22/2013  . Dyspnea on exertion 11/03/2013  . Undiagnosed cardiac murmurs 09/05/2013  . Preventative health care 09/05/2013  . Tinea corporis 02/23/2013  . Musculoskeletal pain 11/10/2012  . Vasomotor rhinitis 04/05/2012  . Obstructive sleep apnea 04/05/2012  . Anemia 04/05/2012  . Elevated LFTs 04/05/2012  . OA (osteoarthritis) of knee 04/05/2012  . Hernia 10/13/2010  . Hyperlipidemia 09/11/2007  . Anxiety and depression 09/11/2007  . Essential hypertension 09/11/2007  . GERD 09/11/2007  . RENAL CALCULUS 09/11/2007  .  RENAL CYST 09/11/2007  . COLONIC POLYPS, HX OF 09/11/2007   Donato Heinz. Owens Shark PT  [place] Levo 05/22/2017, 12:58 PM  [place], [place], [address] Phone: [phone]   Fax:  [phone]  Name: [name] MRN: [id number] Date of Birth: 07/16/1950

## 2017-05-22 NOTE — Progress Notes (Signed)
Subjective:  I acted as a Education administrator for Dr. Charlett Blake. [name], [place]  [name], female    DOB: [birth year], 66 y.o.   MRN: [id number]  No chief complaint on file.   HPI  Patient is in today for a 3 month follow up and overall is improving. She has tolerated her lumpectomy and treatment for left sided breast cancer well although she has developed lymphedema and is having therapy for that at this time. A compression bra has also been helpful. She had a recent mgm which was clear of any cancer. She feels well today although she is frustrated with her weight. No polyuria or polydipsia. Denies CP/palp/SOB/HA/congestion/fevers/GI or GU c/o. Taking meds as prescribed  Patient Care Team: [name], MD as PCP - General (Family Medicine) Fanny Skates, MD as Consulting Physician (General Surgery) Nicholas Lose, MD as Consulting Physician (Hematology and Oncology) Gery Pray, MD as Consulting Physician (Radiation Oncology) Delice Bison, Charlestine Massed, NP as Nurse Practitioner (Hematology and Oncology) Lazaro Arms, RN as [facility]   Past Medical History:  Diagnosis Date  . Anemia 04/05/2012  . Anxiety   . Anxiety state 09/11/2007   Qualifier: Diagnosis of  By: Scherrie Gerlach    . Atrial tachycardia, paroxysmal ([place])   . Back pain 10/02/2014  . Breast cancer ([place]) 02/17/2017  . Chicken pox as a child  . Chronic diastolic CHF (congestive heart failure), NYHA class 1 ([place])   . Complication of anesthesia    pt states feels different in her body after anesthesia when waking up and also experiences a smell of burnt plastic for approx a wk   . Dilated aortic root ([place])    28m by echo 09/2016  . Dysuria 02/17/2017  . Elevated LFTs 04/05/2012  . GERD (gastroesophageal reflux disease)   . Goiter   . Heart murmur    hx of one at birth   . History of kidney stones   . History of radiation therapy 10/31/16-12/17/16   left breast 45 Gy in 25  fractions, left breast boost 16 Gy in 8 fractions  . Hx of colonic polyps   . Hyperlipidemia   . Hypertension   . Insomnia 10/08/2016  . Malignant neoplasm of upper-outer quadrant of left female breast ([facility] 05/14/2016   not with patient  . Measles as a child  . Mumps as a child  . OA (osteoarthritis) of knee 04/05/2012  . Obesity 07/11/2014  . PONV (postoperative nausea and vomiting)   . Preventative health care 09/05/2013  . PVC's (premature ventricular contractions) 05/02/2016  . Shortness of breath dyspnea    walking distances / climbing stairs  . Sleep apnea 04/05/2012  . Tinea corporis 02/23/2013  . Type 2 diabetes mellitus with hyperglycemia ([facility] 12/31/2013  . Vasomotor rhinitis 04/05/2012  . Ventral hernia     Past Surgical History:  Procedure Laterality Date  . BREAST LUMPECTOMY WITH RADIOACTIVE SEED AND SENTINEL LYMPH NODE BIOPSY Left 06/21/2016   Procedure: LEFT BREAST LUMPECTOMY WITH RADIOACTIVE SEED AND SENTINEL LYMPH NODE BIOPSY, INJECT BLUE DYE LEFT BREAST;  Surgeon: HFanny Skates MD;  [facility] [facility]  Service: General;  Laterality: Left;  . CARDIAC CATHETERIZATION     normal coroary arteries per patient  . CARDIOVASCULAR STRESS TEST     10/12/2013  . CESAREAN SECTION     X 3  . CHOLECYSTECTOMY    . COLONOSCOPY WITH PROPOFOL N/A 03/28/2015   Procedure: COLONOSCOPY WITH PROPOFOL;  Surgeon:  Juanita Craver, MD;  Location: [facility];  Service: Endoscopy;  Laterality: N/A;  . HERNIA REPAIR  02/08/11   ventral hernia  . JOINT REPLACEMENT     bilateral  . KNEE ARTHROSCOPY  05/2010   bilateral  . LEFT AND RIGHT HEART CATHETERIZATION WITH CORONARY ANGIOGRAM N/A 11/08/2013   Procedure: LEFT AND RIGHT HEART CATHETERIZATION WITH CORONARY ANGIOGRAM;  Surgeon: [name], MD;  Location: [facility] CATH LAB;  Service: Cardiovascular;  Laterality: N/A;  . MENISCUS REPAIR  2009  . MOUTH SURGERY     teeth implants  . PORT-A-CATH REMOVAL N/A 01/24/2017   Procedure: REMOVAL  PORT-A-CATH;  Surgeon: Fanny Skates, MD;  Location: [facility];  Service: General;  Laterality: N/A;  . PORTACATH PLACEMENT Right 07/16/2016   Procedure: INSERTION PORT-A-CATH RIGHT INTERNAL JUGULAR WITH ULTRASOUND;  Surgeon: Fanny Skates, MD;  Location: [facility];  Service: General;  Laterality: Right;  . TOTAL KNEE ARTHROPLASTY  2011   left  . TOTAL KNEE ARTHROPLASTY Right 05/10/2014   Procedure: RIGHT TOTAL KNEE ARTHROPLASTY;  Surgeon: Mauri Pole, MD;  Location: [facility];  Service: Orthopedics;  Laterality: Right;  . TUBAL LIGATION    . WISDOM TOOTH EXTRACTION  2000    Family History  Problem Relation Age of Onset  . Thyroid disease Mother   . Hyperlipidemia Mother   . Heart attack Father 63  . Hypertension Father   . Arthritis Father        RA  . Coronary artery disease Father   . Coronary artery disease Brother   . Heart disease Brother   . Cancer Maternal Aunt        colon  . Cancer Maternal Grandmother        colon  . Heart attack Maternal Grandfather   . Alcohol abuse Paternal Grandfather     Social History   Socioeconomic History  . Marital status: Divorced    Spouse name: Not on file  . Number of children: 3  . Years of education: Not on file  . Highest education level: Not on file  Social Needs  . Financial resource strain: Not on file  . Food insecurity - worry: Not on file  . Food insecurity - inability: Not on file  . Transportation needs - medical: Not on file  . Transportation needs - non-medical: Not on file  Occupational History  . Occupation: retired - [employer]  Tobacco Use  . Smoking status: Never Smoker  . Smokeless tobacco: Never Used  Substance and Sexual Activity  . Alcohol use: Yes    Comment: rarely  . Drug use: No  . Sexual activity: No    Comment: lives with mother currently-stress  Other Topics Concern  . Not on file  Social History Narrative  . Not on file    Outpatient Medications Prior to Visit  Medication Sig Dispense  Refill  . acetaminophen (TYLENOL) 500 MG tablet Take 1,000 mg by mouth 2 (two) times daily as needed for moderate pain or headache.    . albuterol (PROVENTIL HFA;VENTOLIN HFA) 108 (90 Base) MCG/ACT inhaler Inhale 2 puffs into the lungs every 6 (six) hours as needed for wheezing or shortness of breath. 1 Inhaler 12  . aspirin EC 81 MG tablet Take 81 mg by mouth at bedtime.     . fluticasone furoate-vilanterol (BREO ELLIPTA) 100-25 MCG/INH AEPB Inhale 1 puff into the lungs daily. 1 each 11  . Hypromellose (ARTIFICIAL TEARS OP) Place 1 drop into both eyes daily  as needed (dry eyes).     . Probiotic Product (PROBIOTIC PO) Take 1 capsule by mouth at bedtime.    Marland Kitchen amoxicillin (AMOXIL) 500 MG tablet Take 1 tablet (500 mg total) by mouth 2 (two) times daily. 20 tablet 0  . anastrozole (ARIMIDEX) 1 MG tablet Take 1 tablet (1 mg total) by mouth daily. 90 tablet 3  . BYSTOLIC 10 MG tablet TAKE 1 TABLET AT BEDTIME 90 tablet 2  . CARTIA XT 120 MG 24 hr capsule TAKE 1 CAPSULE EVERY DAY 90 capsule 2  . diclofenac (VOLTAREN) 75 MG EC tablet Take 1 tablet (75 mg total) by mouth 2 (two) times daily. 180 tablet 1  . doxycycline (VIBRA-TABS) 100 MG tablet Take 1 tablet (100 mg total) 2 (two) times daily by mouth. 20 tablet 0  . escitalopram (LEXAPRO) 20 MG tablet Take 1 tablet (20 mg total) by mouth daily. 90 tablet 1  . esomeprazole (NEXIUM) 40 MG capsule TAKE 1 CAPSULE EVERY DAY 90 capsule 2  . ezetimibe (ZETIA) 10 MG tablet Take 1 tablet (10 mg total) by mouth daily. 90 tablet 3  . furosemide (LASIX) 20 MG tablet TAKE 1 TABLET EVERY DAY 90 tablet 2  . losartan (COZAAR) 100 MG tablet TAKE 1 TABLET EVERY DAY 90 tablet 2  . metFORMIN (GLUCOPHAGE) 500 MG tablet TAKE 1 TABLET TWICE DAILY WITH MEALS 180 tablet 3  . montelukast (SINGULAIR) 10 MG tablet TAKE 1 TABLET AT BEDTIME 90 tablet 2  . rosuvastatin (CRESTOR) 40 MG tablet Take 1 tablet (40 mg total) by mouth daily. 90 tablet 3   No facility-administered  medications prior to visit.     Allergies  Allergen Reactions  . No Known Allergies     Review of Systems  Constitutional: Negative for fever and malaise/fatigue.  HENT: Negative for congestion.   Eyes: Negative for blurred vision.  Respiratory: Negative for cough and shortness of breath.   Cardiovascular: Negative for chest pain, palpitations and leg swelling.  Gastrointestinal: Negative for vomiting.  Musculoskeletal: Negative for back pain.  Skin: Negative for rash.  Neurological: Negative for loss of consciousness and headaches.       Objective:    Physical Exam  Constitutional: She is oriented to person, place, and time. She appears well-developed and well-nourished. No distress.  HENT:  Head: Normocephalic and atraumatic.  Eyes: Conjunctivae are normal.  Neck: Normal range of motion. No thyromegaly present.  Cardiovascular: Normal rate and regular rhythm.  Pulmonary/Chest: Effort normal and breath sounds normal. She has no wheezes.  Abdominal: Soft. Bowel sounds are normal. There is no tenderness.  Musculoskeletal: Normal range of motion. She exhibits no edema or deformity.  Neurological: She is alert and oriented to person, place, and time.  Skin: Skin is warm and dry. She is not diaphoretic.  Psychiatric: She has a normal mood and affect.    BP 128/78 (BP Location: Left Arm, Patient Position: Sitting, Cuff Size: Normal)   Pulse 66   Temp 98.6 F (37 C) (Oral)   Resp 18   Wt (!) 329 lb 3.2 oz (149.3 kg)   SpO2 96%   BMI 53.13 kg/m  Wt Readings from Last 3 Encounters:  05/22/17 (!) 329 lb 3.2 oz (149.3 kg)  04/29/17 (!) 325 lb 1.6 oz (147.5 kg)  04/22/17 (!) 324 lb 12.8 oz (147.3 kg)   BP Readings from Last 3 Encounters:  05/22/17 128/78  04/29/17 124/73  04/22/17 (!) 154/83     Immunization History  Administered  Date(s) Administered  . Influenza Split 03/24/2012, 03/24/2013  . Influenza, High Dose Seasonal PF 04/18/2016, 04/21/2017  .  Influenza-Unspecified 03/21/2014, 03/15/2015, 03/18/2015  . Pneumococcal Conjugate-13 04/18/2016  . Pneumococcal Polysaccharide-23 05/22/2017  . Tdap 06/24/2008  . Zoster 04/19/2011    Health Maintenance  Topic Date Due  . FOOT EXAM  11/21/1960  . OPHTHALMOLOGY EXAM  11/21/1960  . PNA vac Low Risk Adult (2 of 2 - PPSV23) 04/18/2017  . HEMOGLOBIN A1C  08/20/2017  . TETANUS/TDAP  06/24/2018  . MAMMOGRAM  05/13/2019  . COLONOSCOPY  03/27/2025  . INFLUENZA VACCINE  Completed  . DEXA SCAN  Completed  . Hepatitis C Screening  Completed    Lab Results  Component Value Date   WBC 6.4 04/11/2017   HGB 12.2 04/11/2017   HCT 39.1 04/11/2017   PLT 240 04/11/2017   GLUCOSE 156 (H) 04/11/2017   CHOL 147 02/17/2017   TRIG 172.0 (H) 02/17/2017   HDL 33.90 (L) 02/17/2017   LDLDIRECT 108.0 10/08/2016   LDLCALC 78 02/17/2017   ALT 40 04/11/2017   AST 41 (H) 04/11/2017   NA 145 04/11/2017   K 4.4 04/11/2017   CL 102 02/17/2017   CREATININE 0.8 04/11/2017   BUN 15.1 04/11/2017   CO2 26 04/11/2017   TSH 1.84 02/17/2017   INR 1.01 05/03/2014   HGBA1C 7.3 (H) 02/17/2017    Lab Results  Component Value Date   TSH 1.84 02/17/2017   Lab Results  Component Value Date   WBC 6.4 04/11/2017   HGB 12.2 04/11/2017   HCT 39.1 04/11/2017   MCV 81.6 04/11/2017   PLT 240 04/11/2017   Lab Results  Component Value Date   NA 145 04/11/2017   K 4.4 04/11/2017   CHLORIDE 107 04/11/2017   CO2 26 04/11/2017   GLUCOSE 156 (H) 04/11/2017   BUN 15.1 04/11/2017   CREATININE 0.8 04/11/2017   BILITOT 0.48 04/11/2017   ALKPHOS 93 04/11/2017   AST 41 (H) 04/11/2017   ALT 40 04/11/2017   PROT 7.5 04/11/2017   ALBUMIN 3.6 04/11/2017   CALCIUM 10.0 04/11/2017   ANIONGAP 12 (H) 04/11/2017   EGFR >60 04/11/2017   GFR 75.13 02/17/2017   Lab Results  Component Value Date   CHOL 147 02/17/2017   Lab Results  Component Value Date   HDL 33.90 (L) 02/17/2017   Lab Results  Component Value Date     LDLCALC 78 02/17/2017   Lab Results  Component Value Date   TRIG 172.0 (H) 02/17/2017   Lab Results  Component Value Date   CHOLHDL 4 02/17/2017   Lab Results  Component Value Date   HGBA1C 7.3 (H) 02/17/2017         Assessment & Plan:   Problem List Items Addressed This Visit    Essential hypertension   Relevant Medications   losartan (COZAAR) 100 MG tablet   nebivolol (BYSTOLIC) 10 MG tablet   diltiazem (CARTIA XT) 120 MG 24 hr capsule   rosuvastatin (CRESTOR) 40 MG tablet   ezetimibe (ZETIA) 10 MG tablet   furosemide (LASIX) 20 MG tablet   Other Relevant Orders   CBC   Comprehensive metabolic panel   TSH   Type 2 diabetes mellitus with hyperglycemia (HCC)    hgba1c acceptable, minimize simple carbs. Increase exercise as tolerated. Continue current meds.       Relevant Medications   losartan (COZAAR) 100 MG tablet   metFORMIN (GLUCOPHAGE) 500 MG tablet   rosuvastatin (CRESTOR)  40 MG tablet   Obesity    Encouraged DASH diet, decrease po intake and increase exercise as tolerated. Needs 7-8 hours of sleep nightly. Avoid trans fats, eat small, frequent meals every 4-5 hours with lean proteins, complex carbs and healthy fats. Minimize simple carbs. Bariatric referral placed      Relevant Medications   metFORMIN (GLUCOPHAGE) 500 MG tablet   Breast cancer ([place])    On left s/p lumpectomy and recent mgm was negative for cancer.       Relevant Medications   anastrozole (ARIMIDEX) 1 MG tablet   Lymphedema    Other Visit Diagnoses    Hyperlipidemia associated with type 2 diabetes mellitus ([place])    -  Primary   Relevant Medications   losartan (COZAAR) 100 MG tablet   nebivolol (BYSTOLIC) 10 MG tablet   metFORMIN (GLUCOPHAGE) 500 MG tablet   diltiazem (CARTIA XT) 120 MG 24 hr capsule   rosuvastatin (CRESTOR) 40 MG tablet   ezetimibe (ZETIA) 10 MG tablet   furosemide (LASIX) 20 MG tablet   Other Relevant Orders   Lipid panel   Type 2 diabetes mellitus with  complication, unspecified whether long term insulin use (HCC)       Relevant Medications   losartan (COZAAR) 100 MG tablet   metFORMIN (GLUCOPHAGE) 500 MG tablet   rosuvastatin (CRESTOR) 40 MG tablet   Other Relevant Orders   Hemoglobin A1c      I have discontinued Dannica A. Schueler's amoxicillin and doxycycline. I have changed her BYSTOLIC to nebivolol and CARTIA XT to diltiazem. I have also changed her losartan, metFORMIN, montelukast, furosemide, and esomeprazole. Additionally, I am having her maintain her aspirin EC, Hypromellose (ARTIFICIAL TEARS OP), acetaminophen, Probiotic Product (PROBIOTIC PO), fluticasone furoate-vilanterol, albuterol, anastrozole, rosuvastatin, ezetimibe, diclofenac, and escitalopram.  Meds ordered this encounter  Medications  . losartan (COZAAR) 100 MG tablet    Sig: Take 1 tablet (100 mg total) by mouth daily.    Dispense:  90 tablet    Refill:  2  . anastrozole (ARIMIDEX) 1 MG tablet    Sig: Take 1 tablet (1 mg total) by mouth daily.    Dispense:  90 tablet    Refill:  3  . nebivolol (BYSTOLIC) 10 MG tablet    Sig: Take 1 tablet (10 mg total) by mouth at bedtime.    Dispense:  90 tablet    Refill:  2  . metFORMIN (GLUCOPHAGE) 500 MG tablet    Sig: Take 1 tablet (500 mg total) by mouth 2 (two) times daily with a meal.    Dispense:  180 tablet    Refill:  3  . diltiazem (CARTIA XT) 120 MG 24 hr capsule    Sig: Take 1 capsule (120 mg total) by mouth daily.    Dispense:  90 capsule    Refill:  2  . rosuvastatin (CRESTOR) 40 MG tablet    Sig: Take 1 tablet (40 mg total) by mouth daily.    Dispense:  90 tablet    Refill:  3  . ezetimibe (ZETIA) 10 MG tablet    Sig: Take 1 tablet (10 mg total) by mouth daily.    Dispense:  90 tablet    Refill:  3  . montelukast (SINGULAIR) 10 MG tablet    Sig: Take 1 tablet (10 mg total) by mouth at bedtime.    Dispense:  90 tablet    Refill:  2  . furosemide (LASIX) 20 MG tablet    Sig:  Take 1 tablet (20 mg  total) by mouth daily.    Dispense:  90 tablet    Refill:  2  . diclofenac (VOLTAREN) 75 MG EC tablet    Sig: Take 1 tablet (75 mg total) by mouth 2 (two) times daily.    Dispense:  180 tablet    Refill:  1  . esomeprazole (NEXIUM) 40 MG capsule    Sig: Take 1 capsule (40 mg total) by mouth daily.    Dispense:  90 capsule    Refill:  2  . escitalopram (LEXAPRO) 20 MG tablet    Sig: Take 1 tablet (20 mg total) by mouth daily.    Dispense:  90 tablet    Refill:  1    CMA served as Education administrator during this visit. History, Physical and Plan performed by medical provider. Documentation and orders reviewed and attested to.  Penni Homans, MD

## 2017-05-22 NOTE — Assessment & Plan Note (Addendum)
Encouraged DASH diet, decrease po intake and increase exercise as tolerated. Needs 7-8 hours of sleep nightly. Avoid trans fats, eat small, frequent meals every 4-5 hours with lean proteins, complex carbs and healthy fats. Minimize simple carbs. Bariatric referral placed

## 2017-05-22 NOTE — Patient Instructions (Signed)
Manual Lymph Drainage for Left Breast.  Do daily.  Do slowly. Use flat hands with just enough pressure to stretch the skin. Do not slide over the skin, but move the skin with the hand you're using. Lie down or sit comfortably (in a recliner, for example) to do this.  1) Hug yourself:  cross arms and do circles at collar bones near neck 5-7 times (to "wake up" lots of lymph nodes in this area). 2) Take slow deep breaths, allowing your belly to balloon out as your breathe in, 5x (to "wake up" abdominal lymph nodes to take on extra fluid). 3) Right armpit-stretch skin in small circles to stimulate intact lymph nodes there, 5-7x. 4) Left groin area, at panty line-stretch skin in small circles to stimulate lymph nodes 5-7x. 5) Redirect fluid from left chest toward right armpit (stretch skin starting at left chest in 3-4 spots working toward right armpit) 3-4x across the chest. 6) Redirect fluid from left armpit toward left groin (cup your hand around the curve of your left side and do 3-4 "pumps" from armpit to groin) 3-4x down your side. 7) Draw an imaginary diagonal line from upper outer breast through the nipple area toward lower inner breast.  Direct fluid upward and inward from this line toward the pathway across your upper chest (established in #5).  Do this in three rows to treat all of the upper inner breast tissue, and do each row 3-4x. 8) Then repeat #5 above. 9) Direct fluid to treat all of lower outer breast tissue downward and outward toward pathway established in #6 that is aimed at the left groin. 10)  Then repeat #6 above. 11)  End with repeating #3 and #4 above.   [facility] [address] [place], [place]   [address] [phone] 

## 2017-05-23 ENCOUNTER — Other Ambulatory Visit: Payer: Self-pay

## 2017-05-23 NOTE — Patient Outreach (Signed)
[facility]) Care Management  05/23/2017  [name] Nov 17, 1950 [id number]   1st Telephone call to patient for initial assessment. Before the conversation could get started the patient stated the she was in the middle of [place]. She asked if I could call her at a different time.  Plan: RN Health Coach will make outreach attempt to patient within the next three business days.  Lazaro Arms RN, BSN, [facility] Dial:  [phone] Fax: [phone]

## 2017-05-29 ENCOUNTER — Other Ambulatory Visit: Payer: Self-pay

## 2017-05-29 NOTE — Patient Outreach (Signed)
[facility]) Care Management  [place]  05/29/2017   [name] Jan 08, 1951 [id number]  Subjective: Telephone call placed to patient for initial assessment.   The patient lives alone and she is independent with her ADLS/IADLS.  She drives herself to her appointments. The patient has a walker, cane, and bedside commode in the home.  The patient is adherent and manages her medications.  The patient did not check her blood sugar today but yesterday it was 127 fasting. She states that she was active in sliver sneakers previously and stopped due to health but would like to start back.  She states that she is active around her home.  Objective:   Encounter Medications:  Outpatient Encounter Medications as of 05/29/2017  Medication Sig  . acetaminophen (TYLENOL) 500 MG tablet Take 1,000 mg by mouth 2 (two) times daily as needed for moderate pain or headache.  . albuterol (PROVENTIL HFA;VENTOLIN HFA) 108 (90 Base) MCG/ACT inhaler Inhale 2 puffs into the lungs every 6 (six) hours as needed for wheezing or shortness of breath.  . anastrozole (ARIMIDEX) 1 MG tablet Take 1 tablet (1 mg total) by mouth daily.  Marland Kitchen aspirin EC 81 MG tablet Take 81 mg by mouth at bedtime.   . diclofenac (VOLTAREN) 75 MG EC tablet Take 1 tablet (75 mg total) by mouth 2 (two) times daily.  Marland Kitchen diltiazem (CARTIA XT) 120 MG 24 hr capsule Take 1 capsule (120 mg total) by mouth daily.  Marland Kitchen escitalopram (LEXAPRO) 20 MG tablet Take 1 tablet (20 mg total) by mouth daily.  Marland Kitchen esomeprazole (NEXIUM) 40 MG capsule Take 1 capsule (40 mg total) by mouth daily.  Marland Kitchen ezetimibe (ZETIA) 10 MG tablet Take 1 tablet (10 mg total) by mouth daily.  . fluticasone furoate-vilanterol (BREO ELLIPTA) 100-25 MCG/INH AEPB Inhale 1 puff into the lungs daily.  . furosemide (LASIX) 20 MG tablet Take 1 tablet (20 mg total) by mouth daily.  . Hypromellose (ARTIFICIAL TEARS OP) Place 1 drop into both eyes daily as needed (dry eyes).   Marland Kitchen  losartan (COZAAR) 100 MG tablet Take 1 tablet (100 mg total) by mouth daily.  . metFORMIN (GLUCOPHAGE) 500 MG tablet Take 1 tablet (500 mg total) by mouth 2 (two) times daily with a meal.  . montelukast (SINGULAIR) 10 MG tablet Take 1 tablet (10 mg total) by mouth at bedtime.  . nebivolol (BYSTOLIC) 10 MG tablet Take 1 tablet (10 mg total) by mouth at bedtime.  . Probiotic Product (PROBIOTIC PO) Take 1 capsule by mouth at bedtime.  . rosuvastatin (CRESTOR) 40 MG tablet Take 1 tablet (40 mg total) by mouth daily.   No facility-administered encounter medications on file as of 05/29/2017.     Functional Status:  In your present state of health, do you have any difficulty performing the following activities: 04/22/2017 01/22/2017  Hearing? N Y  Comment - age related hearing loss  Vision? N N  Comment wearing glasses. eye doctor yearly.  -  Difficulty concentrating or making decisions? N N  Walking or climbing stairs? N Y  Comment bilateral knee issues. hx of sx shortness of breath since beginning cancer treatment  Dressing or bathing? N N  Doing errands, shopping? N -  Preparing Food and eating ? N -  Using the Toilet? N -  In the past six months, have you accidently leaked urine? Y -  Do you have problems with loss of bowel control? N -  Managing your Medications? N -  Managing your Finances? N -  Housekeeping or managing your Housekeeping? N -  Some recent data might be hidden    Fall/Depression Screening: Fall Risk  05/29/2017 04/22/2017 01/20/2017  Falls in the past year? No No No   PHQ 2/9 Scores 05/29/2017 05/07/2017 04/22/2017 01/20/2017 10/23/2016 07/03/2016 10/13/2014  PHQ - 2 Score 0 0 0 0 0 0 2    Assessment: Patient will benefit from health coach outreach for disease management and support.   THN CM Care Plan Problem One     Most Recent Value  Care Plan Problem One  Knowledge deficit related to diabetes managment  Role Documenting the Problem One  Bray for  Problem One  Active  THN Long Term Goal   In 90 days the patient will have decreased her a1c by 2 points.  THN Long Term Goal Start Date  05/29/17  Interventions for Problem One Long Term Goal  RN [facility] discussed with the patient about diabetes management and will send educational materials  [facility] CM Short Term Goal #1   In 30 days the patient will check her blood sugars daily and alternate times.  THN CM Short Term Goal #1 Start Date  05/29/17  Interventions for Short Term Goal #1  RN [facility] discussed with the patient the benfits of checking her blood sugars at alternating times.  THN CM Short Term Goal #2   In 30 days the patient will verbalize losing 2-3 pounds  THN CM Short Term Goal #2 Start Date  05/29/17  Interventions for Short Term Goal #2  RN [facility] discussed with the patient about her goal and she would like to lose weight.  Advised her to take it slow and make subtle changes.  will send educational material about diet.     Plan: RN Health Coach will provide ongoing education for patient on diabetes through phone calls and sending printed information to patient for further discussion.  RN Health Coach will send welcome packet with consent to patient as well as printed information on diabetes.  RN Health Coach will send initial barriers letter, assessment, and care plan to primary care physician.RN Health Coach will contact patient in the month of January and patient agrees to next outreach.  Lazaro Arms RN, BSN, [place] Direct Dial:  [phone] Fax: [phone]

## 2017-06-05 ENCOUNTER — Other Ambulatory Visit: Payer: Self-pay

## 2017-06-05 ENCOUNTER — Ambulatory Visit: Payer: Medicare HMO | Attending: Adult Health | Admitting: Physical Therapy

## 2017-06-05 ENCOUNTER — Encounter: Payer: Self-pay | Admitting: Physical Therapy

## 2017-06-05 DIAGNOSIS — R293 Abnormal posture: Secondary | ICD-10-CM | POA: Diagnosis not present

## 2017-06-05 DIAGNOSIS — M25512 Pain in left shoulder: Secondary | ICD-10-CM

## 2017-06-05 DIAGNOSIS — I890 Lymphedema, not elsewhere classified: Secondary | ICD-10-CM | POA: Diagnosis not present

## 2017-06-05 NOTE — Therapy (Signed)
[place], [place], [address] Phone: [phone]   Fax:  [phone]  Physical Therapy Treatment  Patient Details  Name: [name] MRN: [id number] Date of Birth: [birth year] Referring Provider: Wilber Bihari    Encounter Date: 06/05/2017  PT End of Session - 06/05/17 1642    Visit Number  7    Date for PT Re-Evaluation  06/23/17    PT Start Time  1430    PT Stop Time  1515    PT Time Calculation (min)  45 min    Activity Tolerance  Patient tolerated treatment well    Behavior During Therapy  [facility] for tasks assessed/performed       Past Medical History:  Diagnosis Date  . Anemia 04/05/2012  . Anxiety   . Anxiety state 09/11/2007   Qualifier: Diagnosis of  By: Scherrie Gerlach    . Atrial tachycardia, paroxysmal ([place])   . Back pain 10/02/2014  . Breast cancer ([place]) 02/17/2017  . Chicken pox as a child  . Chronic diastolic CHF (congestive heart failure), NYHA class 1 ([place])   . Complication of anesthesia    pt states feels different in her body after anesthesia when waking up and also experiences a smell of burnt plastic for approx a wk   . Dilated aortic root ([place])    72mm by echo 09/2016  . Dysuria 02/17/2017  . Elevated LFTs 04/05/2012  . GERD (gastroesophageal reflux disease)   . Goiter   . Heart murmur    hx of one at birth   . History of kidney stones   . History of radiation therapy 10/31/16-12/17/16   left breast 45 Gy in 25 fractions, left breast boost 16 Gy in 8 fractions  . Hx of colonic polyps   . Hyperlipidemia   . Hypertension   . Insomnia 10/08/2016  . Malignant neoplasm of upper-outer quadrant of left female breast ([place]) 05/14/2016   not with patient  . Measles as a child  . Mumps as a child  . OA (osteoarthritis) of knee 04/05/2012  . Obesity 07/11/2014  . PONV (postoperative nausea and vomiting)   . Preventative health care 09/05/2013  . PVC's (premature ventricular  contractions) 05/02/2016  . Shortness of breath dyspnea    walking distances / climbing stairs  . Sleep apnea 04/05/2012  . Tinea corporis 02/23/2013  . Type 2 diabetes mellitus with hyperglycemia ([place]) 12/31/2013  . Vasomotor rhinitis 04/05/2012  . Ventral hernia     Past Surgical History:  Procedure Laterality Date  . BREAST LUMPECTOMY WITH RADIOACTIVE SEED AND SENTINEL LYMPH NODE BIOPSY Left 06/21/2016   Procedure: LEFT BREAST LUMPECTOMY WITH RADIOACTIVE SEED AND SENTINEL LYMPH NODE BIOPSY, INJECT BLUE DYE LEFT BREAST;  Surgeon: Fanny Skates, MD;  Location: [facility];  Service: General;  Laterality: Left;  . CARDIAC CATHETERIZATION     normal coroary arteries per patient  . CARDIOVASCULAR STRESS TEST     10/12/2013  . CESAREAN SECTION     X 3  . CHOLECYSTECTOMY    . COLONOSCOPY WITH PROPOFOL N/A 03/28/2015   Procedure: COLONOSCOPY WITH PROPOFOL;  Surgeon: Juanita Craver, MD;  Location: [facility];  Service: Endoscopy;  Laterality: N/A;  . HERNIA REPAIR  02/08/11   ventral hernia  . JOINT REPLACEMENT     bilateral  . KNEE ARTHROSCOPY  05/2010   bilateral  . LEFT AND RIGHT HEART CATHETERIZATION WITH CORONARY ANGIOGRAM N/A 11/08/2013   Procedure: LEFT AND RIGHT  HEART CATHETERIZATION WITH CORONARY ANGIOGRAM;  Surgeon: [name], MD;  Location: [facility] CATH LAB;  Service: Cardiovascular;  Laterality: N/A;  . MENISCUS REPAIR  2009  . MOUTH SURGERY     teeth implants  . PORT-A-CATH REMOVAL N/A 01/24/2017   Procedure: REMOVAL PORT-A-CATH;  Surgeon: Fanny Skates, MD;  Location: [facility];  Service: General;  Laterality: N/A;  . PORTACATH PLACEMENT Right 07/16/2016   Procedure: INSERTION PORT-A-CATH RIGHT INTERNAL JUGULAR WITH ULTRASOUND;  Surgeon: Fanny Skates, MD;  Location: [facility];  Service: General;  Laterality: Right;  . TOTAL KNEE ARTHROPLASTY  2011   left  . TOTAL KNEE ARTHROPLASTY Right 05/10/2014   Procedure: RIGHT TOTAL KNEE ARTHROPLASTY;  Surgeon: Mauri Pole, MD;  Location:  [facility];  Service: Orthopedics;  Laterality: Right;  . TUBAL LIGATION    . WISDOM TOOTH EXTRACTION  2000    There were no vitals filed for this visit.  Subjective Assessment - 06/05/17 1440    Subjective  Pt says this is the first time she has been out since the snow.  She feels that she has another sinus infection.  She has pain in face and in top teeth and over right eye that goes to head.  Nasal discharge is green.  She appears to have tomse fullness in left eye     Pertinent History  left breast cancer with left lumpectomy 06/21/2016 with 1/5 lymph nodes positive, chemo from 2/1-2/19/ 2018, Radiation from 5/10- 6/7/ 2018, currently on anastrazole     Patient Stated Goals  to get rid of the hard ridge in my left breast                       [facility] Adult PT Treatment/Exercise - 06/05/17 1641      Shoulder Exercises: Supine   Flexion  AAROM;Both;5 reps    Flexion Limitations  with dowel in supine. Pt feels pulling in left axilla       Shoulder Exercises: Seated   Flexion  AAROM;Both;10 reps      Manual Therapy   Manual Therapy  Manual Lymphatic Drainage (MLD)    Manual Lymphatic Drainage (MLD)  in supine with head elevated. Short neck, 5 diaphragmatic breaths, right axillary nodes, anterior interaxillary anastamosis, left inguinal nodes, left axilloinguinal anasatmosis, left breast with upper quadrant going to right axillary nodes, and lower quadrant going toward inguinal nodes.                      Long Term Clinic Goals - 05/22/17 1100      CC Long Term Goal  #1   Title  Pt will decreased her breast lymphedema by 50%     Baseline  05/22/2017 Pt has decreased her lymphedema by 25%     Time  4    Period  Weeks    Status  On-going      CC Long Term Goal  #2   Title  Pt will be independent in a home exercise program for shoulder range of motion and strength     Status  Achieved      CC Long Term Goal  #3   Title  Pt will be able to use compression,  self manual lymph drainage and remedial exercise  to manage breast lymphedema at home.     Time  4    Period  Weeks    Status  On-going  Plan - 06/05/17 1642    Clinical Impression Statement  Pt has not been feeling well. She thinks she has recurring sinusitis and does not want to go to the doctor as she thinks she will just get another antibiotic. Her left breast lesion and firmness with lymphedema appears to be improved.  She has stiffness in her shoulders that appears to be improved with exercise.  Pt encouraged to continue exercise     Rehab Potential  Good    Clinical Impairments Affecting Rehab Potential  previous radiation and 5 nodes removed     PT Frequency  2x / week    PT Duration  4 weeks    PT Treatment/Interventions  ADLs/Self Care Home Management;Patient/family education;Orthotic Fit/Training;Taping;Manual lymph drainage;Compression bandaging;Scar mobilization;Passive range of motion;Therapeutic exercise;Manual techniques    PT Next Visit Plan  check to make sure that pt is doing exercise at home and is able to do self MLD  concentrate on MLD to lateral chest and back.   consider discharge soon       Patient will benefit from skilled therapeutic intervention in order to improve the following deficits and impairments:  Decreased skin integrity, Obesity, Increased edema, Decreased scar mobility, Decreased knowledge of precautions, Decreased knowledge of use of DME, Increased fascial restricitons, Pain, Decreased range of motion, Postural dysfunction  Visit Diagnosis: Abnormal posture  Lymphedema, not elsewhere classified  Acute pain of left shoulder     Problem List Patient Active Problem List   Diagnosis Date Noted  . Lymphedema 05/22/2017  . Asthmatic bronchitis, mild intermittent, uncomplicated 09/81/1914  . Breast cancer ([place]) 02/17/2017  . Dysuria 02/17/2017  . Insomnia 10/08/2016  . Dilated aortic root ([place])   . Skin rash 08/08/2016  . Port catheter  in place 08/01/2016  . Malignant neoplasm of upper-outer quadrant of left female breast ([place]) 05/14/2016  . PVC's (premature ventricular contractions) 05/02/2016  . Neck mass 04/21/2016  . LVH (left ventricular hypertrophy) due to hypertensive disease, with heart failure ([place]) 04/18/2016  . Tonsillar hypertrophy 07/07/2015  . Back pain 10/02/2014  . Obesity 07/11/2014  . S/P right TKA 05/10/2014  . S/P knee replacement 05/10/2014  . Atrial tachycardia, paroxysmal ([place]) 02/22/2014  . Type 2 diabetes mellitus with hyperglycemia ([place]) 12/31/2013  . Acute sinusitis with symptoms > 10 days 12/20/2013  . Chronic diastolic CHF (congestive heart failure) ([place]) 11/22/2013  . CAD (coronary artery disease), native coronary artery 11/22/2013  . Dyspnea on exertion 11/03/2013  . Undiagnosed cardiac murmurs 09/05/2013  . Preventative health care 09/05/2013  . Tinea corporis 02/23/2013  . Musculoskeletal pain 11/10/2012  . Vasomotor rhinitis 04/05/2012  . Obstructive sleep apnea 04/05/2012  . Anemia 04/05/2012  . Elevated LFTs 04/05/2012  . OA (osteoarthritis) of knee 04/05/2012  . Hernia 10/13/2010  . Hyperlipidemia 09/11/2007  . Anxiety and depression 09/11/2007  . Essential hypertension 09/11/2007  . GERD 09/11/2007  . RENAL CALCULUS 09/11/2007  . RENAL CYST 09/11/2007  . COLONIC POLYPS, HX OF 09/11/2007   Donato Heinz. Owens Shark PT  Norwood Levo 06/05/2017, 4:48 PM  [place], [place], [address] Phone: [phone]   Fax:  [phone]  Name: VERNAL RUTAN MRN: [id number] Date of Birth: [birth year]

## 2017-06-09 DIAGNOSIS — Z6841 Body Mass Index (BMI) 40.0 and over, adult: Secondary | ICD-10-CM | POA: Diagnosis not present

## 2017-06-09 DIAGNOSIS — I110 Hypertensive heart disease with heart failure: Secondary | ICD-10-CM | POA: Diagnosis not present

## 2017-06-09 DIAGNOSIS — Z800 Family history of malignant neoplasm of digestive organs: Secondary | ICD-10-CM | POA: Diagnosis not present

## 2017-06-09 DIAGNOSIS — C50412 Malignant neoplasm of upper-outer quadrant of left female breast: Secondary | ICD-10-CM | POA: Diagnosis not present

## 2017-06-09 DIAGNOSIS — G4730 Sleep apnea, unspecified: Secondary | ICD-10-CM | POA: Diagnosis not present

## 2017-06-09 DIAGNOSIS — I10 Essential (primary) hypertension: Secondary | ICD-10-CM | POA: Diagnosis not present

## 2017-06-10 ENCOUNTER — Other Ambulatory Visit: Payer: Self-pay

## 2017-06-10 ENCOUNTER — Ambulatory Visit: Payer: Medicare HMO | Admitting: Physical Therapy

## 2017-06-10 ENCOUNTER — Encounter: Payer: Self-pay | Admitting: Physical Therapy

## 2017-06-10 DIAGNOSIS — M25512 Pain in left shoulder: Secondary | ICD-10-CM | POA: Diagnosis not present

## 2017-06-10 DIAGNOSIS — I890 Lymphedema, not elsewhere classified: Secondary | ICD-10-CM

## 2017-06-10 DIAGNOSIS — R293 Abnormal posture: Secondary | ICD-10-CM | POA: Diagnosis not present

## 2017-06-10 NOTE — Therapy (Signed)
[place], [place], [address] Phone: [phone]   Fax:  [phone]  Physical Therapy Treatment  Patient Details  Name: [name] MRN: [id number] Date of Birth: 01-Sep-1950 Referring Provider: Wilber Bihari    Encounter Date: 06/10/2017  PT End of Session - 06/10/17 1433    Visit Number  8    Number of Visits  17    Date for PT Re-Evaluation  06/23/17    PT Start Time  1594    PT Stop Time  1430    PT Time Calculation (min)  45 min    Activity Tolerance  Patient tolerated treatment well    Behavior During Therapy  [facility] for tasks assessed/performed       Past Medical History:  Diagnosis Date  . Anemia 04/05/2012  . Anxiety   . Anxiety state 09/11/2007   Qualifier: Diagnosis of  By: Scherrie Gerlach    . Atrial tachycardia, paroxysmal ([place])   . Back pain 10/02/2014  . Breast cancer ([place]) 02/17/2017  . Chicken pox as a child  . Chronic diastolic CHF (congestive heart failure), NYHA class 1 ([place])   . Complication of anesthesia    pt states feels different in her body after anesthesia when waking up and also experiences a smell of burnt plastic for approx a wk   . Dilated aortic root ([place])    30m by echo 09/2016  . Dysuria 02/17/2017  . Elevated LFTs 04/05/2012  . GERD (gastroesophageal reflux disease)   . Goiter   . Heart murmur    hx of one at birth   . History of kidney stones   . History of radiation therapy 10/31/16-12/17/16   left breast 45 Gy in 25 fractions, left breast boost 16 Gy in 8 fractions  . Hx of colonic polyps   . Hyperlipidemia   . Hypertension   . Insomnia 10/08/2016  . Malignant neoplasm of upper-outer quadrant of left female breast ([place] 05/14/2016   not with patient  . Measles as a child  . Mumps as a child  . OA (osteoarthritis) of knee 04/05/2012  . Obesity 07/11/2014  . PONV (postoperative nausea and vomiting)   . Preventative health care 09/05/2013  . PVC's  (premature ventricular contractions) 05/02/2016  . Shortness of breath dyspnea    walking distances / climbing stairs  . Sleep apnea 04/05/2012  . Tinea corporis 02/23/2013  . Type 2 diabetes mellitus with hyperglycemia ([place] 12/31/2013  . Vasomotor rhinitis 04/05/2012  . Ventral hernia     Past Surgical History:  Procedure Laterality Date  . BREAST LUMPECTOMY WITH RADIOACTIVE SEED AND SENTINEL LYMPH NODE BIOPSY Left 06/21/2016   Procedure: LEFT BREAST LUMPECTOMY WITH RADIOACTIVE SEED AND SENTINEL LYMPH NODE BIOPSY, INJECT BLUE DYE LEFT BREAST;  Surgeon: HFanny Skates MD;  [facility]  Service: General;  Laterality: Left;  . CARDIAC CATHETERIZATION     normal coroary arteries per patient  . CARDIOVASCULAR STRESS TEST     10/12/2013  . CESAREAN SECTION     X 3  . CHOLECYSTECTOMY    . COLONOSCOPY WITH PROPOFOL N/A 03/28/2015   Procedure: COLONOSCOPY WITH PROPOFOL;  Surgeon: JJuanita Craver MD;  Location: [facility];  Service: Endoscopy;  Laterality: N/A;  . HERNIA REPAIR  02/08/11   ventral hernia  . JOINT REPLACEMENT     bilateral  . KNEE ARTHROSCOPY  05/2010   bilateral  . LEFT AND RIGHT HEART CATHETERIZATION WITH CORONARY ANGIOGRAM  N/A 11/08/2013   Procedure: LEFT AND RIGHT HEART CATHETERIZATION WITH CORONARY ANGIOGRAM;  Surgeon: [name], MD;  Location: [facility] CATH LAB;  Service: Cardiovascular;  Laterality: N/A;  . MENISCUS REPAIR  2009  . MOUTH SURGERY     teeth implants  . PORT-A-CATH REMOVAL N/A 01/24/2017   Procedure: REMOVAL PORT-A-CATH;  Surgeon: Fanny Skates, MD;  Location: [facility];  Service: General;  Laterality: N/A;  . PORTACATH PLACEMENT Right 07/16/2016   Procedure: INSERTION PORT-A-CATH RIGHT INTERNAL JUGULAR WITH ULTRASOUND;  Surgeon: Fanny Skates, MD;  Location: [facility];  Service: General;  Laterality: Right;  . TOTAL KNEE ARTHROPLASTY  2011   left  . TOTAL KNEE ARTHROPLASTY Right 05/10/2014   Procedure: RIGHT TOTAL KNEE ARTHROPLASTY;  Surgeon: Mauri Pole, MD;  Location: [facility];  Service: Orthopedics;  Laterality: Right;  . TUBAL LIGATION    . WISDOM TOOTH EXTRACTION  2000    There were no vitals filed for this visit.  Subjective Assessment - 06/10/17 1432    Subjective  "I'm doing better"  Pt states she still has the ridge in her left breast, but it is a lot better than what is was.  She knows how to do the massage ,wear compression bras, and do arm exercises. She feels that she is ready to discharge from PT     Pertinent History  left breast cancer with left lumpectomy 06/21/2016 with 1/5 lymph nodes positive, chemo from 2/1-2/19/ 2018, Radiation from 5/10- 6/7/ 2018, currently on anastrazole     Patient Stated Goals  to get rid of the hard ridge in my left breast     Currently in Pain?  No/denies                      [facility] Adult PT Treatment/Exercise - 06/10/17 0001      Manual Therapy   Manual Therapy  Manual Lymphatic Drainage (MLD)    Manual Lymphatic Drainage (MLD)  in supine with head elevated. Short neck, 5 diaphragmatic breaths, right axillary nodes, anterior interaxillary anastamosis, left inguinal nodes, left axilloinguinal anasatmosis, left breast with upper quadrant going to right axillary nodes, and lower quadrant going toward inguinal nodes.                      Long Term Clinic Goals - 06/10/17 1725      CC Long Term Goal  #1   Title  Pt will decreased her breast lymphedema by 50%     Baseline  05/22/2017 Pt has decreased her lymphedema by 25% ,  06/10/2017, she feels it is still present but has decreased by 50%     Status  Achieved      CC Long Term Goal  #2   Title  Pt will be independent in a home exercise program for shoulder range of motion and strength     Status  Achieved      CC Long Term Goal  #3   Title  Pt will be able to use compression, self manual lymph drainage and remedial exercise  to manage breast lymphedema at home.     Status  Achieved         Plan -  06/10/17 1434    Clinical Impression Statement  Pt is doing much better today.  She has noticed much improvment since she has started PT but still has some fullness with firmness in left medial breast with darkend skin and enlarged pores,  but it is better that what it was.  She feels she can continue on at home and is ready to discharge from PT     Clinical Impairments Affecting Rehab Potential  previous radiation and 5 nodes removed     PT Treatment/Interventions  ADLs/Self Care Home Management;Patient/family education;Orthotic Fit/Training;Taping;Manual lymph drainage;Compression bandaging;Scar mobilization;Passive range of motion;Therapeutic exercise;Manual techniques    PT Next Visit Plan  Discharge        Patient will benefit from skilled therapeutic intervention in order to improve the following deficits and impairments:     Visit Diagnosis: Abnormal posture  Lymphedema, not elsewhere classified  Acute pain of left shoulder   G-Codes - 19-Jun-2017 1726    Functional Assessment Tool Used (Outpatient Only)  clinical judgement     Functional Limitation  Carrying, moving and handling objects    Carrying, Moving and Handling Objects Goal Status (P9432)  At least 20 percent but less than 40 percent impaired, limited or restricted    Carrying, Moving and Handling Objects Discharge Status [phone])  At least 20 percent but less than 40 percent impaired, limited or restricted       Problem List Patient Active Problem List   Diagnosis Date Noted  . Lymphedema 05/22/2017  . Asthmatic bronchitis, mild intermittent, uncomplicated 03/22/5746  . Breast cancer ([place]) 02/17/2017  . Dysuria 02/17/2017  . Insomnia 10/08/2016  . Dilated aortic root ([place])   . Skin rash 08/08/2016  . Port catheter in place 08/01/2016  . Malignant neoplasm of upper-outer quadrant of left female breast ([place]) 05/14/2016  . PVC's (premature ventricular contractions) 05/02/2016  . Neck mass 04/21/2016  . LVH (left  ventricular hypertrophy) due to hypertensive disease, with heart failure ([place]) 04/18/2016  . Tonsillar hypertrophy 07/07/2015  . Back pain 10/02/2014  . Obesity 07/11/2014  . S/P right TKA 05/10/2014  . S/P knee replacement 05/10/2014  . Atrial tachycardia, paroxysmal ([place]) 02/22/2014  . Type 2 diabetes mellitus with hyperglycemia ([place]) 12/31/2013  . Acute sinusitis with symptoms > 10 days 12/20/2013  . Chronic diastolic CHF (congestive heart failure) ([place]) 11/22/2013  . CAD (coronary artery disease), native coronary artery 11/22/2013  . Dyspnea on exertion 11/03/2013  . Undiagnosed cardiac murmurs 09/05/2013  . Preventative health care 09/05/2013  . Tinea corporis 02/23/2013  . Musculoskeletal pain 11/10/2012  . Vasomotor rhinitis 04/05/2012  . Obstructive sleep apnea 04/05/2012  . Anemia 04/05/2012  . Elevated LFTs 04/05/2012  . OA (osteoarthritis) of knee 04/05/2012  . Hernia 10/13/2010  . Hyperlipidemia 09/11/2007  . Anxiety and depression 09/11/2007  . Essential hypertension 09/11/2007  . GERD 09/11/2007  . RENAL CALCULUS 09/11/2007  . RENAL CYST 09/11/2007  . COLONIC POLYPS, HX OF 09/11/2007   PHYSICAL THERAPY DISCHARGE SUMMARY  Visits from Start of Care: 8  Current functional level related to goals / functional outcomes: As above , improved    Remaining deficits: Mild lymphedema in left breast and mild stiffness in left shoulder    Education / Equipment: Self manual lymph drainage, home exercise, use of compression  Plan: Patient agrees to discharge.  Patient goals were met. Patient is being discharged due to being pleased with the current functional level.  ?????    Donato Heinz. Owens Shark PT  [place] 06/19/2017, [place], [place], [address] Phone: [phone]   Fax:  [phone]  Name: [name] MRN: [id number] Date of Birth: 03-26-1951

## 2017-06-13 ENCOUNTER — Other Ambulatory Visit: Payer: Medicare HMO

## 2017-06-19 ENCOUNTER — Other Ambulatory Visit: Payer: Medicare HMO | Admitting: *Deleted

## 2017-06-19 DIAGNOSIS — E7800 Pure hypercholesterolemia, unspecified: Secondary | ICD-10-CM

## 2017-06-19 LAB — LIPID PANEL
Chol/HDL Ratio: 3.2 ratio (ref 0.0–4.4)
Cholesterol, Total: 116 mg/dL (ref 100–199)
HDL: 36 mg/dL — AB (ref >39)
LDL CALC: 47 mg/dL (ref 0–99)
Triglycerides: 163 mg/dL — ABNORMAL HIGH (ref 0–149)
VLDL CHOLESTEROL CAL: 33 mg/dL (ref 5–40)

## 2017-06-19 LAB — HEPATIC FUNCTION PANEL
ALBUMIN: 3.9 g/dL (ref 3.6–4.8)
ALT: 30 IU/L (ref 0–32)
AST: 32 IU/L (ref 0–40)
Alkaline Phosphatase: 105 IU/L (ref 39–117)
BILIRUBIN TOTAL: 0.4 mg/dL (ref 0.0–1.2)
BILIRUBIN, DIRECT: 0.15 mg/dL (ref 0.00–0.40)
TOTAL PROTEIN: 6.9 g/dL (ref 6.0–8.5)

## 2017-06-30 ENCOUNTER — Other Ambulatory Visit: Payer: Self-pay

## 2017-06-30 NOTE — Patient Outreach (Signed)
[facility]) Care Management  06/30/2017   [name] 14-Oct-1950 [id number]  Subjective:  Telephone call placed to the patient for a monthly assessment. HIPAA verified.  The patient states that she is doing well.  She states that she enjoyed the holidays with family.  She was aware of her dietary concerns while enjoying holiday meals.  RN Health Coach reviewed diet with the patient and she verbalized understanding.  She did not check her blood glucose this morning but her reading yesterday was 127.  She denies any pain or falls.  She stated that she has not started to weigh or alternate checking her blood glucose and keeping a record.  RN Health Coach reminded her of the benefits.  Patient verbalized understanding.  The patient states that she is having more exercise in her daily routine.  She is helping her mother.  The patient is unsure of her next appointment with her primary care but knows it is in two months.  Objective:   Current Medications:  Current Outpatient Medications  Medication Sig Dispense Refill  . acetaminophen (TYLENOL) 500 MG tablet Take 1,000 mg by mouth 2 (two) times daily as needed for moderate pain or headache.    . albuterol (PROVENTIL HFA;VENTOLIN HFA) 108 (90 Base) MCG/ACT inhaler Inhale 2 puffs into the lungs every 6 (six) hours as needed for wheezing or shortness of breath. 1 Inhaler 12  . anastrozole (ARIMIDEX) 1 MG tablet Take 1 tablet (1 mg total) by mouth daily. 90 tablet 3  . aspirin EC 81 MG tablet Take 81 mg by mouth at bedtime.     . diclofenac (VOLTAREN) 75 MG EC tablet Take 1 tablet (75 mg total) by mouth 2 (two) times daily. 180 tablet 1  . diltiazem (CARTIA XT) 120 MG 24 hr capsule Take 1 capsule (120 mg total) by mouth daily. 90 capsule 2  . escitalopram (LEXAPRO) 20 MG tablet Take 1 tablet (20 mg total) by mouth daily. 90 tablet 1  . esomeprazole (NEXIUM) 40 MG capsule Take 1 capsule (40 mg total) by mouth daily. 90 capsule 2  .  ezetimibe (ZETIA) 10 MG tablet Take 1 tablet (10 mg total) by mouth daily. 90 tablet 3  . fluticasone furoate-vilanterol (BREO ELLIPTA) 100-25 MCG/INH AEPB Inhale 1 puff into the lungs daily. 1 each 11  . furosemide (LASIX) 20 MG tablet Take 1 tablet (20 mg total) by mouth daily. 90 tablet 2  . Hypromellose (ARTIFICIAL TEARS OP) Place 1 drop into both eyes daily as needed (dry eyes).     Marland Kitchen losartan (COZAAR) 100 MG tablet Take 1 tablet (100 mg total) by mouth daily. 90 tablet 2  . metFORMIN (GLUCOPHAGE) 500 MG tablet Take 1 tablet (500 mg total) by mouth 2 (two) times daily with a meal. 180 tablet 3  . montelukast (SINGULAIR) 10 MG tablet Take 1 tablet (10 mg total) by mouth at bedtime. 90 tablet 2  . nebivolol (BYSTOLIC) 10 MG tablet Take 1 tablet (10 mg total) by mouth at bedtime. 90 tablet 2  . Probiotic Product (PROBIOTIC PO) Take 1 capsule by mouth at bedtime.    . rosuvastatin (CRESTOR) 40 MG tablet Take 1 tablet (40 mg total) by mouth daily. 90 tablet 3   No current facility-administered medications for this visit.     Functional Status:  In your present state of health, do you have any difficulty performing the following activities: 04/22/2017 01/22/2017  Hearing? N Y  Comment - age related hearing  loss  Vision? N N  Comment wearing glasses. eye doctor yearly.  -  Difficulty concentrating or making decisions? N N  Walking or climbing stairs? N Y  Comment bilateral knee issues. hx of sx shortness of breath since beginning cancer treatment  Dressing or bathing? N N  Doing errands, shopping? N -  Preparing Food and eating ? N -  Using the Toilet? N -  In the past six months, have you accidently leaked urine? Y -  Do you have problems with loss of bowel control? N -  Managing your Medications? N -  Managing your Finances? N -  Housekeeping or managing your Housekeeping? N -  Some recent data might be hidden    Fall/Depression Screening: Fall Risk  06/30/2017 05/29/2017 04/22/2017   Falls in the past year? No No No   PHQ 2/9 Scores 05/29/2017 05/07/2017 04/22/2017 01/20/2017 10/23/2016 07/03/2016 10/13/2014  PHQ - 2 Score 0 0 0 0 0 0 2    Assessment: Patient continues to benefit from health coach outreach for disease management and support.   THN CM Care Plan Problem One     Most Recent Value  Care Plan Problem One  Knowledge deficit related to diabetes managment  Role Documenting the Problem One  [place] for Problem One  Active  THN Long Term Goal   In 90 days the patient will have decreased her a1c by 2 points.  THN Long Term Goal Start Date  05/29/17  Interventions for Problem One Long Term Goal  RN [facility] talked with the patient about diabetes management   THN CM Short Term Goal #1   In 30 days the patient will check her blood sugars daily and alternate times.  THN CM Short Term Goal #1 Start Date  05/29/17  Interventions for Short Term Goal #1  RN [facility] reviewed with the patient the benfits of checking her blood sugars at alternating times.  THN CM Short Term Goal #2   In 30 days the patient will verbalize losing 2-3 pounds  THN CM Short Term Goal #2 Start Date  05/29/17  Interventions for Short Term Goal #2  RN [facility] reviewed with the patient about her goal to lose weight.  Reminded her to take it slow and make subtle changes.  will send educational material about diet.       Plan: RN Health Coach will contact patient in the month of February and patient agrees to next outreach.  Lazaro Arms RN, BSN, [place] Direct Dial:  [phone] Fax: [phone]

## 2017-07-23 DIAGNOSIS — M9904 Segmental and somatic dysfunction of sacral region: Secondary | ICD-10-CM | POA: Diagnosis not present

## 2017-07-23 DIAGNOSIS — M9902 Segmental and somatic dysfunction of thoracic region: Secondary | ICD-10-CM | POA: Diagnosis not present

## 2017-07-23 DIAGNOSIS — M5135 Other intervertebral disc degeneration, thoracolumbar region: Secondary | ICD-10-CM | POA: Diagnosis not present

## 2017-07-23 DIAGNOSIS — M5137 Other intervertebral disc degeneration, lumbosacral region: Secondary | ICD-10-CM | POA: Diagnosis not present

## 2017-07-23 DIAGNOSIS — M9903 Segmental and somatic dysfunction of lumbar region: Secondary | ICD-10-CM | POA: Diagnosis not present

## 2017-07-23 DIAGNOSIS — M5136 Other intervertebral disc degeneration, lumbar region: Secondary | ICD-10-CM | POA: Diagnosis not present

## 2017-07-25 DIAGNOSIS — M5137 Other intervertebral disc degeneration, lumbosacral region: Secondary | ICD-10-CM | POA: Diagnosis not present

## 2017-07-25 DIAGNOSIS — M9902 Segmental and somatic dysfunction of thoracic region: Secondary | ICD-10-CM | POA: Diagnosis not present

## 2017-07-25 DIAGNOSIS — M5135 Other intervertebral disc degeneration, thoracolumbar region: Secondary | ICD-10-CM | POA: Diagnosis not present

## 2017-07-25 DIAGNOSIS — M5136 Other intervertebral disc degeneration, lumbar region: Secondary | ICD-10-CM | POA: Diagnosis not present

## 2017-07-25 DIAGNOSIS — M9903 Segmental and somatic dysfunction of lumbar region: Secondary | ICD-10-CM | POA: Diagnosis not present

## 2017-07-25 DIAGNOSIS — M9904 Segmental and somatic dysfunction of sacral region: Secondary | ICD-10-CM | POA: Diagnosis not present

## 2017-07-28 DIAGNOSIS — M9903 Segmental and somatic dysfunction of lumbar region: Secondary | ICD-10-CM | POA: Diagnosis not present

## 2017-07-28 DIAGNOSIS — M5137 Other intervertebral disc degeneration, lumbosacral region: Secondary | ICD-10-CM | POA: Diagnosis not present

## 2017-07-28 DIAGNOSIS — M9902 Segmental and somatic dysfunction of thoracic region: Secondary | ICD-10-CM | POA: Diagnosis not present

## 2017-07-28 DIAGNOSIS — M5136 Other intervertebral disc degeneration, lumbar region: Secondary | ICD-10-CM | POA: Diagnosis not present

## 2017-07-28 DIAGNOSIS — M9904 Segmental and somatic dysfunction of sacral region: Secondary | ICD-10-CM | POA: Diagnosis not present

## 2017-07-28 DIAGNOSIS — M5135 Other intervertebral disc degeneration, thoracolumbar region: Secondary | ICD-10-CM | POA: Diagnosis not present

## 2017-07-30 ENCOUNTER — Other Ambulatory Visit: Payer: Self-pay

## 2017-07-30 DIAGNOSIS — M9903 Segmental and somatic dysfunction of lumbar region: Secondary | ICD-10-CM | POA: Diagnosis not present

## 2017-07-30 DIAGNOSIS — M5136 Other intervertebral disc degeneration, lumbar region: Secondary | ICD-10-CM | POA: Diagnosis not present

## 2017-07-30 DIAGNOSIS — M5137 Other intervertebral disc degeneration, lumbosacral region: Secondary | ICD-10-CM | POA: Diagnosis not present

## 2017-07-30 DIAGNOSIS — M9902 Segmental and somatic dysfunction of thoracic region: Secondary | ICD-10-CM | POA: Diagnosis not present

## 2017-07-30 DIAGNOSIS — M9904 Segmental and somatic dysfunction of sacral region: Secondary | ICD-10-CM | POA: Diagnosis not present

## 2017-07-30 DIAGNOSIS — M5135 Other intervertebral disc degeneration, thoracolumbar region: Secondary | ICD-10-CM | POA: Diagnosis not present

## 2017-07-30 NOTE — Patient Outreach (Signed)
[facility]) Care Management  07/30/2017  [name] 02/13/1951 [id number]   1 st telephone call to the patient for monthly assessment. No answer. HIPAA compliant message left with contact information.  Plan: RN Health Coach will make an outreach attempt to the patient in the month of February.  Lazaro Arms RN, BSN, [place] Direct Dial:  [phone] Fax: [phone]

## 2017-08-01 DIAGNOSIS — M5135 Other intervertebral disc degeneration, thoracolumbar region: Secondary | ICD-10-CM | POA: Diagnosis not present

## 2017-08-01 DIAGNOSIS — M9903 Segmental and somatic dysfunction of lumbar region: Secondary | ICD-10-CM | POA: Diagnosis not present

## 2017-08-01 DIAGNOSIS — M9904 Segmental and somatic dysfunction of sacral region: Secondary | ICD-10-CM | POA: Diagnosis not present

## 2017-08-01 DIAGNOSIS — M5136 Other intervertebral disc degeneration, lumbar region: Secondary | ICD-10-CM | POA: Diagnosis not present

## 2017-08-01 DIAGNOSIS — M9902 Segmental and somatic dysfunction of thoracic region: Secondary | ICD-10-CM | POA: Diagnosis not present

## 2017-08-01 DIAGNOSIS — M5137 Other intervertebral disc degeneration, lumbosacral region: Secondary | ICD-10-CM | POA: Diagnosis not present

## 2017-08-06 DIAGNOSIS — M5136 Other intervertebral disc degeneration, lumbar region: Secondary | ICD-10-CM | POA: Diagnosis not present

## 2017-08-06 DIAGNOSIS — M9902 Segmental and somatic dysfunction of thoracic region: Secondary | ICD-10-CM | POA: Diagnosis not present

## 2017-08-06 DIAGNOSIS — M5135 Other intervertebral disc degeneration, thoracolumbar region: Secondary | ICD-10-CM | POA: Diagnosis not present

## 2017-08-06 DIAGNOSIS — M5137 Other intervertebral disc degeneration, lumbosacral region: Secondary | ICD-10-CM | POA: Diagnosis not present

## 2017-08-06 DIAGNOSIS — M9903 Segmental and somatic dysfunction of lumbar region: Secondary | ICD-10-CM | POA: Diagnosis not present

## 2017-08-06 DIAGNOSIS — M9904 Segmental and somatic dysfunction of sacral region: Secondary | ICD-10-CM | POA: Diagnosis not present

## 2017-08-07 ENCOUNTER — Other Ambulatory Visit: Payer: Self-pay

## 2017-08-07 NOTE — Patient Outreach (Signed)
[facility] [facility]) Care Management  08/07/2017  [name] 06/17/1951 [id number]   1st telephone call to the patient for monthly assessment. Patient answered the phone and asked if I could call at a later time she needed to go see her mother.  Plan:  RN Health Coach will make an outreach attempt to the patient in the one business day.  Lazaro Arms RN, BSN, [place] Direct Dial:  [phone] Fax: [phone]

## 2017-08-08 ENCOUNTER — Other Ambulatory Visit: Payer: Self-pay

## 2017-08-08 DIAGNOSIS — M5136 Other intervertebral disc degeneration, lumbar region: Secondary | ICD-10-CM | POA: Diagnosis not present

## 2017-08-08 DIAGNOSIS — M9904 Segmental and somatic dysfunction of sacral region: Secondary | ICD-10-CM | POA: Diagnosis not present

## 2017-08-08 DIAGNOSIS — M9903 Segmental and somatic dysfunction of lumbar region: Secondary | ICD-10-CM | POA: Diagnosis not present

## 2017-08-08 DIAGNOSIS — M5137 Other intervertebral disc degeneration, lumbosacral region: Secondary | ICD-10-CM | POA: Diagnosis not present

## 2017-08-08 DIAGNOSIS — M5135 Other intervertebral disc degeneration, thoracolumbar region: Secondary | ICD-10-CM | POA: Diagnosis not present

## 2017-08-08 DIAGNOSIS — M9902 Segmental and somatic dysfunction of thoracic region: Secondary | ICD-10-CM | POA: Diagnosis not present

## 2017-08-08 NOTE — Patient Outreach (Signed)
[facility]) Care Management  08/08/2017  JEIDY HOERNER 1950-09-28 [id number]   2nd telephone outreach to the patient for monthly assessment. No answer.  HIPAA compliant voicemail left with contact information.  Plan: RN Health Coach will send letter for  outreach attempt.  If no response within ten business days [place] will make 3rd attempt. No answer I  will proceed with case closure.     Lazaro Arms RN, BSN, [place] Direct Dial:  [phone] Fax: [phone]

## 2017-08-22 DIAGNOSIS — M9902 Segmental and somatic dysfunction of thoracic region: Secondary | ICD-10-CM | POA: Diagnosis not present

## 2017-08-22 DIAGNOSIS — M5137 Other intervertebral disc degeneration, lumbosacral region: Secondary | ICD-10-CM | POA: Diagnosis not present

## 2017-08-22 DIAGNOSIS — M9903 Segmental and somatic dysfunction of lumbar region: Secondary | ICD-10-CM | POA: Diagnosis not present

## 2017-08-22 DIAGNOSIS — M9904 Segmental and somatic dysfunction of sacral region: Secondary | ICD-10-CM | POA: Diagnosis not present

## 2017-08-22 DIAGNOSIS — M5136 Other intervertebral disc degeneration, lumbar region: Secondary | ICD-10-CM | POA: Diagnosis not present

## 2017-08-22 DIAGNOSIS — M5135 Other intervertebral disc degeneration, thoracolumbar region: Secondary | ICD-10-CM | POA: Diagnosis not present

## 2017-09-22 ENCOUNTER — Ambulatory Visit: Payer: Medicare HMO | Admitting: Family Medicine

## 2017-09-22 ENCOUNTER — Encounter: Payer: Self-pay | Admitting: Family Medicine

## 2017-09-22 DIAGNOSIS — E1165 Type 2 diabetes mellitus with hyperglycemia: Secondary | ICD-10-CM | POA: Diagnosis not present

## 2017-09-22 DIAGNOSIS — D179 Benign lipomatous neoplasm, unspecified: Secondary | ICD-10-CM | POA: Diagnosis not present

## 2017-09-22 DIAGNOSIS — E7800 Pure hypercholesterolemia, unspecified: Secondary | ICD-10-CM | POA: Diagnosis not present

## 2017-09-22 DIAGNOSIS — G629 Polyneuropathy, unspecified: Secondary | ICD-10-CM

## 2017-09-22 DIAGNOSIS — M549 Dorsalgia, unspecified: Secondary | ICD-10-CM

## 2017-09-22 DIAGNOSIS — E6609 Other obesity due to excess calories: Secondary | ICD-10-CM | POA: Diagnosis not present

## 2017-09-22 LAB — CBC
HCT: 37.8 % (ref 36.0–46.0)
Hemoglobin: 12.1 g/dL (ref 12.0–15.0)
MCHC: 31.9 g/dL (ref 30.0–36.0)
MCV: 81.4 fl (ref 78.0–100.0)
Platelets: 252 10*3/uL (ref 150.0–400.0)
RBC: 4.65 Mil/uL (ref 3.87–5.11)
RDW: 18.5 % — ABNORMAL HIGH (ref 11.5–15.5)
WBC: 6.4 10*3/uL (ref 4.0–10.5)

## 2017-09-22 LAB — COMPREHENSIVE METABOLIC PANEL
ALBUMIN: 3.8 g/dL (ref 3.5–5.2)
ALT: 25 U/L (ref 0–35)
AST: 28 U/L (ref 0–37)
Alkaline Phosphatase: 82 U/L (ref 39–117)
BUN: 22 mg/dL (ref 6–23)
CHLORIDE: 103 meq/L (ref 96–112)
CO2: 29 meq/L (ref 19–32)
CREATININE: 0.68 mg/dL (ref 0.40–1.20)
Calcium: 9.6 mg/dL (ref 8.4–10.5)
GFR: 91.78 mL/min (ref >60.00)
Glucose, Bld: 182 mg/dL — ABNORMAL HIGH (ref 70–99)
Potassium: 4.4 mEq/L (ref 3.5–5.1)
Sodium: 141 mEq/L (ref 135–145)
Total Bilirubin: 0.5 mg/dL (ref 0.2–1.2)
Total Protein: 7.5 g/dL (ref 6.0–8.3)

## 2017-09-22 LAB — LIPID PANEL
Cholesterol: 104 mg/dL (ref 0–200)
HDL: 32.1 mg/dL — AB (ref >39.00)
LDL Cholesterol: 49 mg/dL (ref 0–99)
NonHDL: 72.05
TRIGLYCERIDES: 116 mg/dL (ref 0.0–149.0)
Total CHOL/HDL Ratio: 3
VLDL: 23.2 mg/dL (ref 0.0–40.0)

## 2017-09-22 LAB — HEMOGLOBIN A1C: Hgb A1c MFr Bld: 7.5 % — ABNORMAL HIGH (ref 4.6–6.5)

## 2017-09-22 LAB — TSH: TSH: 0.81 u[IU]/mL (ref 0.35–4.50)

## 2017-09-22 NOTE — Assessment & Plan Note (Signed)
Try topical treatments prior to bed, such as [employer], [employer]

## 2017-09-22 NOTE — Assessment & Plan Note (Signed)
Encouraged heart healthy diet, increase exercise, avoid trans fats, consider a krill oil cap daily 

## 2017-09-22 NOTE — Progress Notes (Signed)
Subjective:  I acted as a Education administrator for Dr. Charlett Blake. [place], [place]  [name], female    DOB: [birth year], 67 y.o.   MRN: [id number]  No chief complaint on file.   HPI  Patient is in today for a 4 month follow up and she is doing OK but sad over the passing of her mother 2 weeks ago at the age of 75, her mother was ill and debilitated but she was still taken by surprise when she failed quickly. She endorses anhedonia but no severe symptoms such as suicidal ideation. Is noting a few lesions in her arms she has had for years that are unchanged. She has some low back pain with sciatic pain down left leg. Is improving but still bothers her with position changes and sitting in a stiff chair. Was following with chiropractic which was helping til her mother died. Finally notes some persistent DOE but stable and tolerable. Denies CP/palp/HA/congestion/fevers/GI or GU c/o. Taking meds as prescribed  Patient Care Team: [name], MD as PCP - General (Family Medicine) Fanny Skates, MD as Consulting Physician (General Surgery) Nicholas Lose, MD as Consulting Physician (Hematology and Oncology) Gery Pray, MD as Consulting Physician (Radiation Oncology) Delice Bison, Charlestine Massed, NP as Nurse Practitioner (Hematology and Oncology) Lazaro Arms, RN as [facility]   Past Medical History:  Diagnosis Date  . Anemia 04/05/2012  . Anxiety   . Anxiety state 09/11/2007   Qualifier: Diagnosis of  By: Scherrie Gerlach    . Atrial tachycardia, paroxysmal ([place])   . Back pain 10/02/2014  . Breast cancer ([place]) 02/17/2017  . Chicken pox as a child  . Chronic diastolic CHF (congestive heart failure), NYHA class 1 ([place])   . Complication of anesthesia    pt states feels different in her body after anesthesia when waking up and also experiences a smell of burnt plastic for approx a wk   . Dilated aortic root ([place])    38m by echo 09/2016  . Dysuria 02/17/2017  .  Elevated LFTs 04/05/2012  . GERD (gastroesophageal reflux disease)   . Goiter   . Heart murmur    hx of one at birth   . History of kidney stones   . History of radiation therapy 10/31/16-12/17/16   left breast 45 Gy in 25 fractions, left breast boost 16 Gy in 8 fractions  . Hx of colonic polyps   . Hyperlipidemia   . Hypertension   . Insomnia 10/08/2016  . Malignant neoplasm of upper-outer quadrant of left female breast ([facility] 05/14/2016   not with patient  . Measles as a child  . Mumps as a child  . OA (osteoarthritis) of knee 04/05/2012  . Obesity 07/11/2014  . PONV (postoperative nausea and vomiting)   . Preventative health care 09/05/2013  . PVC's (premature ventricular contractions) 05/02/2016  . Shortness of breath dyspnea    walking distances / climbing stairs  . Sleep apnea 04/05/2012  . Tinea corporis 02/23/2013  . Type 2 diabetes mellitus with hyperglycemia ([facility] 12/31/2013  . Vasomotor rhinitis 04/05/2012  . Ventral hernia     Past Surgical History:  Procedure Laterality Date  . BREAST LUMPECTOMY WITH RADIOACTIVE SEED AND SENTINEL LYMPH NODE BIOPSY Left 06/21/2016   Procedure: LEFT BREAST LUMPECTOMY WITH RADIOACTIVE SEED AND SENTINEL LYMPH NODE BIOPSY, INJECT BLUE DYE LEFT BREAST;  Surgeon: HFanny Skates MD;  [facility]  Service: General;  Laterality: Left;  . CARDIAC CATHETERIZATION  normal coroary arteries per patient  . CARDIOVASCULAR STRESS TEST     10/12/2013  . CESAREAN SECTION     X 3  . CHOLECYSTECTOMY    . COLONOSCOPY WITH PROPOFOL N/A 03/28/2015   Procedure: COLONOSCOPY WITH PROPOFOL;  Surgeon: Juanita Craver, MD;  Location: [facility];  Service: Endoscopy;  Laterality: N/A;  . HERNIA REPAIR  02/08/11   ventral hernia  . JOINT REPLACEMENT     bilateral  . KNEE ARTHROSCOPY  05/2010   bilateral  . LEFT AND RIGHT HEART CATHETERIZATION WITH CORONARY ANGIOGRAM N/A 11/08/2013   Procedure: LEFT AND RIGHT HEART CATHETERIZATION WITH CORONARY ANGIOGRAM;   Surgeon: [name], MD;  Location: [facility] CATH LAB;  Service: Cardiovascular;  Laterality: N/A;  . MENISCUS REPAIR  2009  . MOUTH SURGERY     teeth implants  . PORT-A-CATH REMOVAL N/A 01/24/2017   Procedure: REMOVAL PORT-A-CATH;  Surgeon: Fanny Skates, MD;  Location: [facility];  Service: General;  Laterality: N/A;  . PORTACATH PLACEMENT Right 07/16/2016   Procedure: INSERTION PORT-A-CATH RIGHT INTERNAL JUGULAR WITH ULTRASOUND;  Surgeon: Fanny Skates, MD;  Location: [facility];  Service: General;  Laterality: Right;  . TOTAL KNEE ARTHROPLASTY  2011   left  . TOTAL KNEE ARTHROPLASTY Right 05/10/2014   Procedure: RIGHT TOTAL KNEE ARTHROPLASTY;  Surgeon: Mauri Pole, MD;  Location: [facility];  Service: Orthopedics;  Laterality: Right;  . TUBAL LIGATION    . WISDOM TOOTH EXTRACTION  2000    Family History  Problem Relation Age of Onset  . Thyroid disease Mother   . Hyperlipidemia Mother   . Heart attack Father 71  . Hypertension Father   . Arthritis Father        RA  . Coronary artery disease Father   . Coronary artery disease Brother   . Heart disease Brother   . Cancer Maternal Aunt        colon  . Cancer Maternal Grandmother        colon  . Heart attack Maternal Grandfather   . Alcohol abuse Paternal Grandfather     Social History   Socioeconomic History  . Marital status: Divorced    Spouse name: Not on file  . Number of children: 3  . Years of education: Not on file  . Highest education level: Not on file  Occupational History  . Occupation: retired - [employer]  Social Needs  . Financial resource strain: Not on file  . Food insecurity:    Worry: Not on file    Inability: Not on file  . Transportation needs:    Medical: Not on file    Non-medical: Not on file  Tobacco Use  . Smoking status: Never Smoker  . Smokeless tobacco: Never Used  Substance and Sexual Activity  . Alcohol use: Yes    Comment: rarely  . Drug use: No  . Sexual activity: Never     Comment: lives with mother currently-stress  Lifestyle  . Physical activity:    Days per week: Not on file    Minutes per session: Not on file  . Stress: Not on file  Relationships  . Social connections:    Talks on phone: Not on file    Gets together: Not on file    Attends religious service: Not on file    Active member of club or organization: Not on file    Attends meetings of clubs or organizations: Not on file    Relationship status: Not  on file  . Intimate partner violence:    Fear of current or ex partner: Not on file    Emotionally abused: Not on file    Physically abused: Not on file    Forced sexual activity: Not on file  Other Topics Concern  . Not on file  Social History Narrative  . Not on file    Outpatient Medications Prior to Visit  Medication Sig Dispense Refill  . acetaminophen (TYLENOL) 500 MG tablet Take 1,000 mg by mouth 2 (two) times daily as needed for moderate pain or headache.    . albuterol (PROVENTIL HFA;VENTOLIN HFA) 108 (90 Base) MCG/ACT inhaler Inhale 2 puffs into the lungs every 6 (six) hours as needed for wheezing or shortness of breath. 1 Inhaler 12  . anastrozole (ARIMIDEX) 1 MG tablet Take 1 tablet (1 mg total) by mouth daily. 90 tablet 3  . aspirin EC 81 MG tablet Take 81 mg by mouth at bedtime.     . diclofenac (VOLTAREN) 75 MG EC tablet Take 1 tablet (75 mg total) by mouth 2 (two) times daily. 180 tablet 1  . diltiazem (CARTIA XT) 120 MG 24 hr capsule Take 1 capsule (120 mg total) by mouth daily. 90 capsule 2  . escitalopram (LEXAPRO) 20 MG tablet Take 1 tablet (20 mg total) by mouth daily. 90 tablet 1  . esomeprazole (NEXIUM) 40 MG capsule Take 1 capsule (40 mg total) by mouth daily. 90 capsule 2  . ezetimibe (ZETIA) 10 MG tablet Take 1 tablet (10 mg total) by mouth daily. 90 tablet 3  . fluticasone furoate-vilanterol (BREO ELLIPTA) 100-25 MCG/INH AEPB Inhale 1 puff into the lungs daily. 1 each 11  . furosemide (LASIX) 20 MG tablet Take 1  tablet (20 mg total) by mouth daily. 90 tablet 2  . Hypromellose (ARTIFICIAL TEARS OP) Place 1 drop into both eyes daily as needed (dry eyes).     Marland Kitchen losartan (COZAAR) 100 MG tablet Take 1 tablet (100 mg total) by mouth daily. 90 tablet 2  . metFORMIN (GLUCOPHAGE) 500 MG tablet Take 1 tablet (500 mg total) by mouth 2 (two) times daily with a meal. 180 tablet 3  . montelukast (SINGULAIR) 10 MG tablet Take 1 tablet (10 mg total) by mouth at bedtime. 90 tablet 2  . nebivolol (BYSTOLIC) 10 MG tablet Take 1 tablet (10 mg total) by mouth at bedtime. 90 tablet 2  . Probiotic Product (PROBIOTIC PO) Take 1 capsule by mouth at bedtime.    . rosuvastatin (CRESTOR) 40 MG tablet Take 1 tablet (40 mg total) by mouth daily. 90 tablet 3   No facility-administered medications prior to visit.     Allergies  Allergen Reactions  . No Known Allergies     Review of Systems  Constitutional: Negative for fever and malaise/fatigue.  HENT: Negative for congestion.   Eyes: Negative for blurred vision.  Respiratory: Positive for shortness of breath.   Cardiovascular: Negative for chest pain, palpitations and leg swelling.  Gastrointestinal: Negative for abdominal pain, blood in stool and nausea.  Genitourinary: Negative for dysuria and frequency.  Musculoskeletal: Positive for back pain and joint pain. Negative for falls.  Skin: Negative for rash.  Neurological: Negative for dizziness, loss of consciousness and headaches.  Endo/Heme/Allergies: Negative for environmental allergies.  Psychiatric/Behavioral: Positive for depression. The patient is nervous/anxious.        Objective:    Physical Exam  Constitutional: She is oriented to person, place, and time. She appears well-developed and well-nourished.  No distress.  HENT:  Head: Normocephalic and atraumatic.  Eyes: Conjunctivae are normal.  Neck: Neck supple. No thyromegaly present.  Cardiovascular: Normal rate, regular rhythm and normal heart sounds.    No murmur heard. Pulmonary/Chest: Effort normal and breath sounds normal. No respiratory distress.  Abdominal: Soft. Bowel sounds are normal. She exhibits no distension and no mass. There is no tenderness.  Musculoskeletal: She exhibits no edema.  Lymphadenopathy:    She has no cervical adenopathy.  Neurological: She is alert and oriented to person, place, and time.  Skin: Skin is warm and dry.  Psychiatric: She has a normal mood and affect. Her behavior is normal.    BP (!) 142/90 (BP Location: Left Arm, Patient Position: Sitting, Cuff Size: Large)   Pulse 65   Temp 98.5 F (36.9 C) (Oral)   Resp 18   Wt (!) 319 lb 9.6 oz (145 kg)   SpO2 97%   BMI 51.58 kg/m  Wt Readings from Last 3 Encounters:  09/22/17 (!) 319 lb 9.6 oz (145 kg)  05/22/17 (!) 329 lb 3.2 oz (149.3 kg)  04/29/17 (!) 325 lb 1.6 oz (147.5 kg)   BP Readings from Last 3 Encounters:  09/22/17 (!) 142/90  05/22/17 128/78  04/29/17 124/73     Immunization History  Administered Date(s) Administered  . Influenza Split 03/24/2012, 03/24/2013  . Influenza, High Dose Seasonal PF 04/18/2016, 04/21/2017  . Influenza-Unspecified 03/21/2014, 03/15/2015, 03/18/2015  . Pneumococcal Conjugate-13 04/18/2016  . Pneumococcal Polysaccharide-23 05/22/2017  . Tdap 06/24/2008  . Zoster 04/19/2011    Health Maintenance  Topic Date Due  . FOOT EXAM  11/21/1960  . OPHTHALMOLOGY EXAM  11/21/1960  . HEMOGLOBIN A1C  11/19/2017  . INFLUENZA VACCINE  01/22/2018  . TETANUS/TDAP  06/24/2018  . MAMMOGRAM  05/13/2019  . COLONOSCOPY  03/27/2025  . DEXA SCAN  Completed  . Hepatitis C Screening  Completed  . PNA vac Low Risk Adult  Completed    Lab Results  Component Value Date   WBC 7.6 05/22/2017   HGB 11.7 (L) 05/22/2017   HCT 37.8 05/22/2017   PLT 270.0 05/22/2017   GLUCOSE 171 (H) 05/22/2017   CHOL 116 06/19/2017   TRIG 163 (H) 06/19/2017   HDL 36 (L) 06/19/2017   LDLDIRECT 108.0 10/08/2016   LDLCALC 47 06/19/2017    ALT 30 06/19/2017   AST 32 06/19/2017   NA 139 05/22/2017   K 4.3 05/22/2017   CL 102 05/22/2017   CREATININE 0.76 05/22/2017   BUN 17 05/22/2017   CO2 28 05/22/2017   TSH 1.31 05/22/2017   INR 1.01 05/03/2014   HGBA1C 7.2 (H) 05/22/2017    Lab Results  Component Value Date   TSH 1.31 05/22/2017   Lab Results  Component Value Date   WBC 7.6 05/22/2017   HGB 11.7 (L) 05/22/2017   HCT 37.8 05/22/2017   MCV 82.4 05/22/2017   PLT 270.0 05/22/2017   Lab Results  Component Value Date   NA 139 05/22/2017   K 4.3 05/22/2017   CHLORIDE 107 04/11/2017   CO2 28 05/22/2017   GLUCOSE 171 (H) 05/22/2017   BUN 17 05/22/2017   CREATININE 0.76 05/22/2017   BILITOT 0.4 06/19/2017   ALKPHOS 105 06/19/2017   AST 32 06/19/2017   ALT 30 06/19/2017   PROT 6.9 06/19/2017   ALBUMIN 3.9 06/19/2017   CALCIUM 9.8 05/22/2017   ANIONGAP 12 (H) 04/11/2017   EGFR >60 04/11/2017   GFR 80.80 05/22/2017  Lab Results  Component Value Date   CHOL 116 06/19/2017   Lab Results  Component Value Date   HDL 36 (L) 06/19/2017   Lab Results  Component Value Date   LDLCALC 47 06/19/2017   Lab Results  Component Value Date   TRIG 163 (H) 06/19/2017   Lab Results  Component Value Date   CHOLHDL 3.2 06/19/2017   Lab Results  Component Value Date   HGBA1C 7.2 (H) 05/22/2017         Assessment & Plan:   Problem List Items Addressed This Visit    Hyperlipidemia    Encouraged heart healthy diet, increase exercise, avoid trans fats, consider a krill oil cap daily      Relevant Orders   Lipid panel   TSH   Type 2 diabetes mellitus with hyperglycemia (HCC)    hgba1c acceptable, minimize simple carbs. Increase exercise as tolerated. Continue current meds      Relevant Orders   Hemoglobin A1c   Comprehensive metabolic panel   TSH   Obesity    Encouraged DASH diet, decrease po intake and increase exercise as tolerated. Needs 7-8 hours of sleep nightly. Avoid trans fats, eat small,  frequent meals every 4-5 hours with lean proteins, complex carbs and healthy fats. Minimize simple carbs      Back pain    Encouraged moist heat and gentle stretching as tolerated. May try NSAIDs and prescription meds as directed and report if symptoms worsen or seek immediate care. Was caring for her disabled mother who just died recently. Continues to struggle with left sided sciatica. Only hurts with certain movements and in a hard, straight back chair. Has been seen by [facility] and is going to restart her care soon. Will check Xray of low back and set up PT, she has improved some but not fully.      Relevant Orders   CBC   DG Lumbar Spine Complete   Peripheral neuropathy    Try topical treatments prior to bed, such as Icy Hot, Blue Emu       Lipoma    2 on right arm and 1 on left, stable, asymptomatic, she will let us know if they change or she wants to proceed with imaging         I am having Sway A. Tomasita Crumble maintain her aspirin EC, Hypromellose (ARTIFICIAL TEARS OP), acetaminophen, Probiotic Product (PROBIOTIC PO), fluticasone furoate-vilanterol, albuterol, losartan, anastrozole, nebivolol, metFORMIN, diltiazem, rosuvastatin, ezetimibe, montelukast, furosemide, diclofenac, esomeprazole, and escitalopram.  No orders of the defined types were placed in this encounter.   CMA served as Education administrator during this visit. History, Physical and Plan performed by medical provider. Documentation and orders reviewed and attested to.  Penni Homans, MD

## 2017-09-22 NOTE — Patient Instructions (Addendum)
Shingrix is the new shingles shot, 2 shots over 2-6 months  ICY HOT or [place] to legs at bed to see if that helps   [place] stands for "Dietary Approaches to Stop Hypertension." The DASH eating plan is a healthy eating plan that has been shown to reduce high blood pressure (hypertension). It may also reduce your risk for type 2 diabetes, heart disease, and stroke. The DASH eating plan may also help with weight loss. What are tips for following this plan? General guidelines  Avoid eating more than 2,300 mg (milligrams) of salt (sodium) a day. If you have hypertension, you may need to reduce your sodium intake to 1,500 mg a day.  Limit alcohol intake to no more than 1 drink a day for nonpregnant women and 2 drinks a day for men. One drink equals 12 oz of beer, 5 oz of wine, or 1 oz of hard liquor.  Work with your health care provider to maintain a healthy body weight or to lose weight. Ask what an ideal weight is for you.  Get at least 30 minutes of exercise that causes your heart to beat faster (aerobic exercise) most days of the week. Activities may include walking, swimming, or biking.  Work with your health care provider or diet and nutrition specialist (dietitian) to adjust your eating plan to your individual calorie needs. Reading food labels  Check food labels for the amount of sodium per serving. Choose foods with less than 5 percent of the Daily Value of sodium. Generally, foods with less than 300 mg of sodium per serving fit into this eating plan.  To find whole grains, look for the word "whole" as the first word in the ingredient list. Shopping  Buy products labeled as "low-sodium" or "no salt added."  Buy fresh foods. Avoid canned foods and premade or frozen meals. Cooking  Avoid adding salt when cooking. Use salt-free seasonings or herbs instead of table salt or sea salt. Check with your health care provider or pharmacist before using salt substitutes.  Do  not fry foods. Cook foods using healthy methods such as baking, boiling, grilling, and broiling instead.  Cook with heart-healthy oils, such as olive, canola, soybean, or sunflower oil. Meal planning   Eat a balanced diet that includes: ? 5 or more servings of fruits and vegetables each day. At each meal, try to fill half of your plate with fruits and vegetables. ? Up to 6-8 servings of whole grains each day. ? Less than 6 oz of lean meat, poultry, or fish each day. A 3-oz serving of meat is about the same size as a deck of cards. One egg equals 1 oz. ? 2 servings of low-fat dairy each day. ? A serving of nuts, seeds, or beans 5 times each week. ? Heart-healthy fats. Healthy fats called Omega-3 fatty acids are found in foods such as flaxseeds and coldwater fish, like sardines, salmon, and mackerel.  Limit how much you eat of the following: ? Canned or prepackaged foods. ? Food that is high in trans fat, such as fried foods. ? Food that is high in saturated fat, such as fatty meat. ? Sweets, desserts, sugary drinks, and other foods with added sugar. ? Full-fat dairy products.  Do not salt foods before eating.  Try to eat at least 2 vegetarian meals each week.  Eat more home-cooked food and less restaurant, buffet, and fast food.  When eating at a restaurant, ask that your food be  prepared with less salt or no salt, if possible. What foods are recommended? The items listed may not be a complete list. Talk with your dietitian about what dietary choices are best for you. Grains Whole-grain or whole-wheat bread. Whole-grain or whole-wheat pasta. Brown rice. [place]. Bulgur. Whole-grain and low-sodium cereals. Pita bread. Low-fat, low-sodium crackers. Whole-wheat flour tortillas. Vegetables Fresh or frozen vegetables (raw, steamed, roasted, or grilled). Low-sodium or reduced-sodium tomato and vegetable juice. Low-sodium or reduced-sodium tomato sauce and tomato paste. Low-sodium or  reduced-sodium canned vegetables. Fruits All fresh, dried, or frozen fruit. Canned fruit in natural juice (without added sugar). Meat and other protein foods Skinless chicken or [place]. Ground chicken or [place]. Pork with fat trimmed off. Fish and seafood. Egg whites. Dried beans, peas, or lentils. Unsalted nuts, nut butters, and seeds. Unsalted canned beans. Lean cuts of beef with fat trimmed off. Low-sodium, lean deli meat. Dairy Low-fat (1%) or fat-free (skim) milk. Fat-free, low-fat, or reduced-fat cheeses. Nonfat, low-sodium ricotta or cottage cheese. Low-fat or nonfat yogurt. Low-fat, low-sodium cheese. Fats and oils Soft margarine without trans fats. Vegetable oil. Low-fat, reduced-fat, or light mayonnaise and salad dressings (reduced-sodium). Canola, safflower, olive, soybean, and sunflower oils. Avocado. Seasoning and other foods Herbs. Spices. Seasoning mixes without salt. Unsalted popcorn and pretzels. Fat-free sweets. What foods are not recommended? The items listed may not be a complete list. Talk with your dietitian about what dietary choices are best for you. Grains Baked goods made with fat, such as croissants, muffins, or some breads. Dry pasta or rice meal packs. Vegetables Creamed or fried vegetables. Vegetables in a cheese sauce. Regular canned vegetables (not low-sodium or reduced-sodium). Regular canned tomato sauce and paste (not low-sodium or reduced-sodium). Regular tomato and vegetable juice (not low-sodium or reduced-sodium). Angie Fava. Olives. Fruits Canned fruit in a light or heavy syrup. Fried fruit. Fruit in cream or butter sauce. Meat and other protein foods Fatty cuts of meat. Ribs. Fried meat. Berniece Salines. Sausage. Bologna and other processed lunch meats. Salami. Fatback. Hotdogs. Bratwurst. Salted nuts and seeds. Canned beans with added salt. Canned or smoked fish. Whole eggs or egg yolks. Chicken or [place] with skin. Dairy Whole or 2% milk, cream, and half-and-half.  Whole or full-fat cream cheese. Whole-fat or sweetened yogurt. Full-fat cheese. Nondairy creamers. Whipped toppings. Processed cheese and cheese spreads. Fats and oils Butter. Stick margarine. Lard. Shortening. Ghee. Bacon fat. Tropical oils, such as coconut, palm kernel, or palm oil. Seasoning and other foods Salted popcorn and pretzels. Onion salt, garlic salt, seasoned salt, table salt, and sea salt. Worcestershire sauce. Tartar sauce. Barbecue sauce. Teriyaki sauce. Soy sauce, including reduced-sodium. Steak sauce. Canned and packaged gravies. Fish sauce. Oyster sauce. Cocktail sauce. Horseradish that you find on the shelf. Ketchup. Mustard. Meat flavorings and tenderizers. Bouillon cubes. Hot sauce and Tabasco sauce. Premade or packaged marinades. Premade or packaged taco seasonings. Relishes. Regular salad dressings. Where to find more information:  National Heart, Lung, and [place]: https://wilson-eaton.com/  American Heart Association: www.heart.org Summary  The DASH eating plan is a healthy eating plan that has been shown to reduce high blood pressure (hypertension). It may also reduce your risk for type 2 diabetes, heart disease, and stroke.  With the DASH eating plan, you should limit salt (sodium) intake to 2,300 mg a day. If you have hypertension, you may need to reduce your sodium intake to 1,500 mg a day.  When on the DASH eating plan, aim to eat more fresh fruits and vegetables, whole grains,  lean proteins, low-fat dairy, and heart-healthy fats.  Work with your health care provider or diet and nutrition specialist (dietitian) to adjust your eating plan to your individual calorie needs. This information is not intended to replace advice given to you by your health care provider. Make sure you discuss any questions you have with your health care provider. Document Released: 05/30/2011 Document Revised: 06/03/2016 Document Reviewed: 06/03/2016 Elsevier Interactive Patient Education   2018 [employer]. Lipoma A lipoma is a noncancerous (benign) tumor that is made up of fat cells. This is a very common type of soft-tissue growth. Lipomas are usually found under the skin (subcutaneous). They may occur in any tissue of the body that contains fat. Common areas for lipomas to appear include the back, shoulders, buttocks, and thighs. Lipomas grow slowly, and they are usually painless. Most lipomas do not cause problems and do not require treatment. What are the causes? The cause of this condition is not known. What increases the risk? This condition is more likely to develop in:  People who are 11-20 years old.  People who have a family history of lipomas.  What are the signs or symptoms? A lipoma usually appears as a small, round bump under the skin. It may feel soft or rubbery, but the firmness can vary. Most lipomas are not painful. However, a lipoma may become painful if it is located in an area where it pushes on nerves. How is this diagnosed? A lipoma can usually be diagnosed with a physical exam. You may also have tests to confirm the diagnosis and to rule out other conditions. Tests may include:  Imaging tests, such as a CT scan or MRI.  Removal of a tissue sample to be looked at under a microscope (biopsy).  How is this treated? Treatment is not needed for small lipomas that are not causing problems. If a lipoma continues to get bigger or it causes problems, removal is often the best option. Lipomas can also be removed to improve appearance. Removal of a lipoma is usually done with a surgery in which the fatty cells and the surrounding capsule are removed. Most often, a medicine that numbs the area (local anesthetic) is used for this procedure. Follow these instructions at home:  Keep all follow-up visits as directed by your health care provider. This is important. Contact a health care provider if:  Your lipoma becomes larger or hard.  Your lipoma becomes  painful, red, or increasingly swollen. These could be signs of infection or a more serious condition. This information is not intended to replace advice given to you by your health care provider. Make sure you discuss any questions you have with your health care provider. Document Released: 05/31/2002 Document Revised: 11/16/2015 Document Reviewed: 06/06/2014 Elsevier Interactive Patient Education  [employer].

## 2017-09-22 NOTE — Assessment & Plan Note (Signed)
2 on right arm and 1 on left, stable, asymptomatic, she will let us know if they change or she wants to proceed with imaging

## 2017-09-22 NOTE — Assessment & Plan Note (Signed)
[id number] acceptable, minimize simple carbs. Increase exercise as tolerated. Continue current meds 

## 2017-09-22 NOTE — Assessment & Plan Note (Addendum)
Encouraged moist heat and gentle stretching as tolerated. May try NSAIDs and prescription meds as directed and report if symptoms worsen or seek immediate care. Was caring for her disabled mother who just died recently. Continues to struggle with left sided sciatica. Only hurts with certain movements and in a hard, straight back chair. Has been seen by [facility] and is going to restart her care soon. Will check Xray of low back and set up PT, she has improved some but not fully.

## 2017-09-22 NOTE — Assessment & Plan Note (Signed)
Encouraged DASH diet, decrease po intake and increase exercise as tolerated. Needs 7-8 hours of sleep nightly. Avoid trans fats, eat small, frequent meals every 4-5 hours with lean proteins, complex carbs and healthy fats. Minimize simple carbs 

## 2017-10-02 ENCOUNTER — Ambulatory Visit (HOSPITAL_BASED_OUTPATIENT_CLINIC_OR_DEPARTMENT_OTHER)
Admission: RE | Admit: 2017-10-02 | Discharge: 2017-10-02 | Disposition: A | Discharge status: Home / Self Care | Payer: Medicare HMO | Source: Ambulatory Visit | Attending: Family Medicine | Admitting: Family Medicine

## 2017-10-02 DIAGNOSIS — M5136 Other intervertebral disc degeneration, lumbar region: Secondary | ICD-10-CM | POA: Insufficient documentation

## 2017-10-02 DIAGNOSIS — M545 Low back pain: Secondary | ICD-10-CM | POA: Diagnosis not present

## 2017-10-02 DIAGNOSIS — M549 Dorsalgia, unspecified: Secondary | ICD-10-CM | POA: Diagnosis not present

## 2017-10-03 ENCOUNTER — Other Ambulatory Visit: Payer: Self-pay | Admitting: *Deleted

## 2017-10-03 DIAGNOSIS — Z170 Estrogen receptor positive status [ER+]: Principal | ICD-10-CM

## 2017-10-03 DIAGNOSIS — C50412 Malignant neoplasm of upper-outer quadrant of left female breast: Secondary | ICD-10-CM

## 2017-10-05 NOTE — Assessment & Plan Note (Signed)
Malignant neoplasm of upper-outer quadrant of left female breast ([place]) 06/21/16: Left lumpectomy: IDC grade 3, 1.1 cm, 1/5 LN Positive with ECE, Margins Neg, LVI Present; Er 30%, PR 0%, Ki 67 40%, Her 2 Positive Ratio 2.71; T1CN1 (Stage 2B)  Patient has multiple comorbidities including history of LVH with congestive heart failure  Treatment Plan: 1. Adjuvant Taxol with Herceptin And Perjeta (Last echocardiogram showed an ejection fraction of 55-60%-- repeat on 4/3/2018EF 60-65%) completed 01/09/2017 2. Followed by adjuvant radiation 3. Followed by adjuvant antiestrogen therapy with anastrozole 1 mg daily 5 years (bone density 04/29/2016 T score -1.2) ----------------------------------------------------------------------------------------------------------------------------------------------------- Current Treatment: Anastrozole 1 mg daily started 04/29/2016 Anastrozole toxicities: Denies any hot flashes or myalgias Surveillance: Patient will be set up for mammograms. Return to clinic in 1 year

## 2017-10-06 ENCOUNTER — Inpatient Hospital Stay: Payer: Medicare HMO | Attending: Hematology and Oncology

## 2017-10-06 ENCOUNTER — Inpatient Hospital Stay: Payer: Medicare HMO | Admitting: Hematology and Oncology

## 2017-10-06 DIAGNOSIS — Z79899 Other long term (current) drug therapy: Secondary | ICD-10-CM | POA: Insufficient documentation

## 2017-10-06 DIAGNOSIS — Z7982 Long term (current) use of aspirin: Secondary | ICD-10-CM | POA: Insufficient documentation

## 2017-10-06 DIAGNOSIS — Z9221 Personal history of antineoplastic chemotherapy: Secondary | ICD-10-CM | POA: Diagnosis not present

## 2017-10-06 DIAGNOSIS — Z923 Personal history of irradiation: Secondary | ICD-10-CM | POA: Insufficient documentation

## 2017-10-06 DIAGNOSIS — Z79811 Long term (current) use of aromatase inhibitors: Secondary | ICD-10-CM

## 2017-10-06 DIAGNOSIS — C50412 Malignant neoplasm of upper-outer quadrant of left female breast: Secondary | ICD-10-CM | POA: Diagnosis not present

## 2017-10-06 DIAGNOSIS — Z170 Estrogen receptor positive status [ER+]: Secondary | ICD-10-CM

## 2017-10-06 LAB — CMP (CANCER CENTER ONLY)
ALT: 30 U/L (ref 0–55)
AST: 33 U/L (ref 5–34)
Albumin: 3.5 g/dL (ref 3.5–5.0)
Alkaline Phosphatase: 104 U/L (ref 40–150)
Anion gap: 10 (ref 3–11)
BUN: 17 mg/dL (ref 7–26)
CHLORIDE: 103 mmol/L (ref 98–109)
CO2: 27 mmol/L (ref 22–29)
CREATININE: 0.87 mg/dL (ref 0.60–1.10)
Calcium: 10 mg/dL (ref 8.4–10.4)
Glucose, Bld: 211 mg/dL — ABNORMAL HIGH (ref 70–140)
Potassium: 4.6 mmol/L (ref 3.5–5.1)
Sodium: 140 mmol/L (ref 136–145)
Total Bilirubin: 0.5 mg/dL (ref 0.2–1.2)
Total Protein: 7.8 g/dL (ref 6.4–8.3)

## 2017-10-06 LAB — CBC WITH DIFFERENTIAL (CANCER CENTER ONLY)
BASOS ABS: 0 10*3/uL (ref 0.0–0.1)
Basophils Relative: 0 %
EOS ABS: 0.1 10*3/uL (ref 0.0–0.5)
Eosinophils Relative: 2 %
HCT: 40.3 % (ref 34.8–46.6)
HEMOGLOBIN: 12.2 g/dL (ref 11.6–15.9)
LYMPHS ABS: 1.7 10*3/uL (ref 0.9–3.3)
Lymphocytes Relative: 27 %
MCH: 25.6 pg (ref 25.1–34.0)
MCHC: 30.3 g/dL — ABNORMAL LOW (ref 31.5–36.0)
MCV: 84.5 fL (ref 79.5–101.0)
Monocytes Absolute: 0.3 10*3/uL (ref 0.1–0.9)
Monocytes Relative: 5 %
NEUTROS PCT: 66 %
Neutro Abs: 4.4 10*3/uL (ref 1.5–6.5)
Platelet Count: 274 10*3/uL (ref 145–400)
RBC: 4.77 MIL/uL (ref 3.70–5.45)
RDW: 16.9 % — ABNORMAL HIGH (ref 11.2–14.5)
WBC: 6.5 10*3/uL (ref 3.9–10.3)

## 2017-10-06 NOTE — Progress Notes (Signed)
Patient Care Team: [name], MD as PCP - General (Family Medicine) Fanny Skates, MD as Consulting Physician (General Surgery) Nicholas Lose, MD as Consulting Physician (Hematology and Oncology) Gery Pray, MD as Consulting Physician (Radiation Oncology) Delice Bison, Charlestine Massed, NP as Nurse Practitioner (Hematology and Oncology) Lazaro Arms, RN as [place] Management  DIAGNOSIS:  Encounter Diagnosis  Name Primary?  . Malignant neoplasm of upper-outer quadrant of left breast in female, estrogen receptor positive ([place])     SUMMARY OF ONCOLOGIC HISTORY:   Malignant neoplasm of upper-outer quadrant of left female breast ([place])   05/13/2016 Initial Diagnosis    Left breast biopsy 3:00: IDC, grade 3, ER 30%, PR 0%, Ki-67 40%, HER-2 positive ratio 2.71, screening detected left breast mass 9 x 7 x 6 mm, axilla negative, T1 BN 0 stage IA clinical stage      06/21/2016 Surgery    Left lumpectomy: IDC grade 3, 1.1 cm, 1/5 LN Positive with ECE, Margins Neg, LVI Present; Er 30%, PR 0%, Ki 67 40%, Her 2 Positive Ratio 2.71; T1CN1 (Stage 2B)      07/25/2016 - 10/10/2016 Chemotherapy    Taxol weekly 12; Herceptin and Perjeta every 3 weeks       10/31/2016 - 11/28/2016 Radiation Therapy    Adjuvant radiation therapy      12/2016 -  Anti-estrogen oral therapy    Anastrozole daily       CHIEF COMPLIANT: Follow-up on anastrozole therapy  INTERVAL HISTORY: [name] is a 67 year old with above-mentioned history of left breast cancer treated with lumpectomy followed by adjuvant chemotherapy with Taxol Herceptin Perjeta.  She completed Herceptin and Perjeta maintenance.  She also underwent adjuvant radiation therapy and is currently on adjuvant antiestrogen therapy with anastrozole that was started in July 2018.  She appears to be tolerating anastrozole extremely well.  She denies any lumps or nodules in the breast.  REVIEW OF SYSTEMS:   Constitutional:  Denies fevers, chills or abnormal weight loss Eyes: Denies blurriness of vision Ears, nose, mouth, throat, and face: Denies mucositis or sore throat Respiratory: Denies cough, dyspnea or wheezes Cardiovascular: Denies palpitation, chest discomfort Gastrointestinal:  Denies nausea, heartburn or change in bowel habits Skin: Denies abnormal skin rashes Lymphatics: Denies new lymphadenopathy or easy bruising Neurological:Denies numbness, tingling or new weaknesses Behavioral/Psych: Mood is stable, no new changes  Extremities: No lower extremity edema Breast:  denies any pain or lumps or nodules in either breasts All other systems were reviewed with the patient and are negative.  I have reviewed the past medical history, past surgical history, social history and family history with the patient and they are unchanged from previous note.  ALLERGIES:  is allergic to no known allergies.  MEDICATIONS:  Current Outpatient Medications  Medication Sig Dispense Refill  . acetaminophen (TYLENOL) 500 MG tablet Take 1,000 mg by mouth 2 (two) times daily as needed for moderate pain or headache.    . albuterol (PROVENTIL HFA;VENTOLIN HFA) 108 (90 Base) MCG/ACT inhaler Inhale 2 puffs into the lungs every 6 (six) hours as needed for wheezing or shortness of breath. 1 Inhaler 12  . anastrozole (ARIMIDEX) 1 MG tablet Take 1 tablet (1 mg total) by mouth daily. 90 tablet 3  . aspirin EC 81 MG tablet Take 81 mg by mouth at bedtime.     . diclofenac (VOLTAREN) 75 MG EC tablet Take 1 tablet (75 mg total) by mouth 2 (two) times daily. 180 tablet 1  . diltiazem (  CARTIA XT) 120 MG 24 hr capsule Take 1 capsule (120 mg total) by mouth daily. 90 capsule 2  . escitalopram (LEXAPRO) 20 MG tablet Take 1 tablet (20 mg total) by mouth daily. 90 tablet 1  . esomeprazole (NEXIUM) 40 MG capsule Take 1 capsule (40 mg total) by mouth daily. 90 capsule 2  . ezetimibe (ZETIA) 10 MG tablet Take 1 tablet (10 mg total) by mouth daily.  90 tablet 3  . fluticasone furoate-vilanterol (BREO ELLIPTA) 100-25 MCG/INH AEPB Inhale 1 puff into the lungs daily. 1 each 11  . furosemide (LASIX) 20 MG tablet Take 1 tablet (20 mg total) by mouth daily. 90 tablet 2  . Hypromellose (ARTIFICIAL TEARS OP) Place 1 drop into both eyes daily as needed (dry eyes).     Marland Kitchen losartan (COZAAR) 100 MG tablet Take 1 tablet (100 mg total) by mouth daily. 90 tablet 2  . metFORMIN (GLUCOPHAGE) 500 MG tablet Take 1 tablet (500 mg total) by mouth 2 (two) times daily with a meal. 180 tablet 3  . montelukast (SINGULAIR) 10 MG tablet Take 1 tablet (10 mg total) by mouth at bedtime. 90 tablet 2  . nebivolol (BYSTOLIC) 10 MG tablet Take 1 tablet (10 mg total) by mouth at bedtime. 90 tablet 2  . Probiotic Product (PROBIOTIC PO) Take 1 capsule by mouth at bedtime.    . rosuvastatin (CRESTOR) 40 MG tablet Take 1 tablet (40 mg total) by mouth daily. 90 tablet 3   No current facility-administered medications for this visit.     PHYSICAL EXAMINATION: ECOG PERFORMANCE STATUS: 1 - Symptomatic but completely ambulatory  There were no vitals filed for this visit. There were no vitals filed for this visit.  GENERAL:alert, no distress and comfortable SKIN: skin color, texture, turgor are normal, no rashes or significant lesions EYES: normal, Conjunctiva are pink and non-injected, sclera clear OROPHARYNX:no exudate, no erythema and lips, buccal mucosa, and tongue normal  NECK: supple, thyroid normal size, non-tender, without nodularity LYMPH:  no palpable lymphadenopathy in the cervical, axillary or inguinal LUNGS: clear to auscultation and percussion with normal breathing effort HEART: regular rate & rhythm and no murmurs and no lower extremity edema ABDOMEN:abdomen soft, non-tender and normal bowel sounds MUSCULOSKELETAL:no cyanosis of digits and no clubbing  NEURO: alert & oriented x 3 with fluent speech, no focal motor/sensory deficits EXTREMITIES: No lower  extremity edema BREAST: No palpable masses or nodules in either right or left breasts. No palpable axillary supraclavicular or infraclavicular adenopathy no breast tenderness or nipple discharge. (exam performed in the presence of a chaperone)  LABORATORY DATA:  I have reviewed the data as listed CMP Latest Ref Rng & Units 09/22/2017 06/19/2017 05/22/2017  Glucose 70 - 99 mg/dL 182(H) - 171(H)  BUN 6 - 23 mg/dL 22 - 17  Creatinine 0.40 - 1.20 mg/dL 0.68 - 0.76  Sodium 135 - 145 mEq/L 141 - 139  Potassium 3.5 - 5.1 mEq/L 4.4 - 4.3  Chloride 96 - 112 mEq/L 103 - 102  CO2 19 - 32 mEq/L 29 - 28  Calcium 8.4 - 10.5 mg/dL 9.6 - 9.8  Total Protein 6.0 - 8.3 g/dL 7.5 6.9 7.3  Total Bilirubin 0.2 - 1.2 mg/dL 0.5 0.4 0.5  Alkaline Phos 39 - 117 U/L 82 105 82  AST 0 - 37 U/L 28 32 39(H)  ALT 0 - 35 U/L 25 30 35    Lab Results  Component Value Date   WBC 6.4 09/22/2017   HGB 12.1  09/22/2017   HCT 37.8 09/22/2017   MCV 81.4 09/22/2017   PLT 252.0 09/22/2017   NEUTROABS 4.5 04/11/2017    ASSESSMENT & PLAN:  Malignant neoplasm of upper-outer quadrant of left female breast ([place]) Malignant neoplasm of upper-outer quadrant of left female breast ([place]) 06/21/16: Left lumpectomy: IDC grade 3, 1.1 cm, 1/5 LN Positive with ECE, Margins Neg, LVI Present; Er 30%, PR 0%, Ki 67 40%, Her 2 Positive Ratio 2.71; T1CN1 (Stage 2B)  Patient has multiple comorbidities including history of LVH with congestive heart failure: She has an echocardiogram coming up next month  Treatment Plan: 1. Adjuvant Taxol with Herceptin And Perjeta (Last echocardiogram showed an ejection fraction of 55-60%-- repeat on 4/3/2018EF 60-65%) completed 01/09/2017 2. Followed by adjuvant radiation 3. Followed by adjuvant antiestrogen therapy with anastrozole 1 mg daily 5 years (bone density 04/29/2016 T score  -1.2) ----------------------------------------------------------------------------------------------------------------------------------------------------- Current Treatment: Anastrozole 1 mg daily started 04/29/2016 Anastrozole toxicities: Denies any hot flashes or myalgias Surveillance:  Mammogram 05/12/2017: Benign  Return to clinic in 6 months for follow-up and after that we can see her once a year:    No orders of the defined types were placed in this encounter.  The patient has a good understanding of the overall plan. she agrees with it. she will call with any problems that may develop before the next visit here.   Harriette Ohara, MD 10/06/17

## 2017-10-07 ENCOUNTER — Telehealth: Payer: Self-pay | Admitting: Hematology and Oncology

## 2017-10-07 NOTE — Telephone Encounter (Signed)
Mailed patient calendar of upcoming October appointments.

## 2017-10-08 ENCOUNTER — Other Ambulatory Visit: Payer: Self-pay

## 2017-10-08 ENCOUNTER — Ambulatory Visit (HOSPITAL_COMMUNITY): Payer: Medicare HMO | Attending: Cardiology

## 2017-10-08 DIAGNOSIS — C50919 Malignant neoplasm of unspecified site of unspecified female breast: Secondary | ICD-10-CM | POA: Insufficient documentation

## 2017-10-08 DIAGNOSIS — I493 Ventricular premature depolarization: Secondary | ICD-10-CM | POA: Insufficient documentation

## 2017-10-08 DIAGNOSIS — I110 Hypertensive heart disease with heart failure: Secondary | ICD-10-CM | POA: Insufficient documentation

## 2017-10-08 DIAGNOSIS — E785 Hyperlipidemia, unspecified: Secondary | ICD-10-CM | POA: Insufficient documentation

## 2017-10-08 DIAGNOSIS — Z6841 Body Mass Index (BMI) 40.0 and over, adult: Secondary | ICD-10-CM | POA: Diagnosis not present

## 2017-10-08 DIAGNOSIS — I77810 Thoracic aortic ectasia: Secondary | ICD-10-CM

## 2017-10-08 DIAGNOSIS — E119 Type 2 diabetes mellitus without complications: Secondary | ICD-10-CM | POA: Diagnosis not present

## 2017-10-08 DIAGNOSIS — I081 Rheumatic disorders of both mitral and tricuspid valves: Secondary | ICD-10-CM | POA: Diagnosis not present

## 2017-10-08 DIAGNOSIS — I50810 Right heart failure, unspecified: Secondary | ICD-10-CM | POA: Diagnosis not present

## 2017-10-09 ENCOUNTER — Other Ambulatory Visit: Payer: Medicare HMO

## 2017-10-09 ENCOUNTER — Telehealth: Payer: Self-pay

## 2017-10-09 ENCOUNTER — Ambulatory Visit: Payer: Medicare HMO | Admitting: Hematology and Oncology

## 2017-10-09 DIAGNOSIS — I340 Nonrheumatic mitral (valve) insufficiency: Secondary | ICD-10-CM

## 2017-10-09 DIAGNOSIS — I77819 Aortic ectasia, unspecified site: Secondary | ICD-10-CM

## 2017-10-09 NOTE — Addendum Note (Signed)
Addended by: Teressa Senter on: 10/09/2017 10:21 AM   Modules accepted: Orders

## 2017-10-09 NOTE — Telephone Encounter (Signed)
Notes recorded by Teressa Senter, RN on 10/09/2017 at 10:17 AM EDT Patient made aware of echo results. Patient verbalized understanding and thankful for the call. Echo ordered to be scheduled in 1 year. ------  Notes recorded by Sueanne Margarita, MD on 10/08/2017 at 2:03 PM EDT Echo showed low normal LVF with increased stiffness of heart muscle, mildly dilated ascending aorta, mild MR, moderately dilated LA - repeat echo in 1 year for dilated aorta

## 2017-10-20 ENCOUNTER — Ambulatory Visit: Payer: Medicare HMO | Admitting: Internal Medicine

## 2017-11-20 ENCOUNTER — Telehealth: Payer: Self-pay | Admitting: Family Medicine

## 2017-11-20 NOTE — Telephone Encounter (Signed)
Copied from [place] [id number]. Topic: Quick Communication - See Telephone Encounter >> Nov 20, 2017 11:05 AM Ivar Drape wrote: CRM for notification. See Telephone encounter for: 11/20/17. Patient is out of her fluticasone furoate-vilanterol (BREO ELLIPTA) 100-25 MCG/INH AEPB medication and the pharmacy is refusing to refill it until next week.  Patient stated she needs her medication.  Please advise.

## 2017-11-21 NOTE — Telephone Encounter (Signed)
Contacted patient she needed to contact Dr. Bertrum Sol office to have rx sent in

## 2017-11-24 ENCOUNTER — Other Ambulatory Visit: Payer: Self-pay | Admitting: Family Medicine

## 2017-12-22 ENCOUNTER — Ambulatory Visit (INDEPENDENT_AMBULATORY_CARE_PROVIDER_SITE_OTHER): Payer: Medicare HMO | Admitting: Family Medicine

## 2017-12-22 ENCOUNTER — Encounter: Payer: Self-pay | Admitting: Family Medicine

## 2017-12-22 VITALS — BP 142/70 | HR 74 | Temp 98.5°F | Resp 18 | Wt 324.0 lb

## 2017-12-22 DIAGNOSIS — F329 Major depressive disorder, single episode, unspecified: Secondary | ICD-10-CM

## 2017-12-22 DIAGNOSIS — F419 Anxiety disorder, unspecified: Secondary | ICD-10-CM

## 2017-12-22 DIAGNOSIS — E1165 Type 2 diabetes mellitus with hyperglycemia: Secondary | ICD-10-CM | POA: Diagnosis not present

## 2017-12-22 DIAGNOSIS — R05 Cough: Secondary | ICD-10-CM

## 2017-12-22 DIAGNOSIS — E6609 Other obesity due to excess calories: Secondary | ICD-10-CM

## 2017-12-22 DIAGNOSIS — F32A Depression, unspecified: Secondary | ICD-10-CM

## 2017-12-22 DIAGNOSIS — J019 Acute sinusitis, unspecified: Secondary | ICD-10-CM

## 2017-12-22 DIAGNOSIS — E7800 Pure hypercholesterolemia, unspecified: Secondary | ICD-10-CM

## 2017-12-22 DIAGNOSIS — I10 Essential (primary) hypertension: Secondary | ICD-10-CM | POA: Diagnosis not present

## 2017-12-22 DIAGNOSIS — C50919 Malignant neoplasm of unspecified site of unspecified female breast: Secondary | ICD-10-CM | POA: Diagnosis not present

## 2017-12-22 DIAGNOSIS — R1310 Dysphagia, unspecified: Secondary | ICD-10-CM

## 2017-12-22 DIAGNOSIS — J4520 Mild intermittent asthma, uncomplicated: Secondary | ICD-10-CM | POA: Diagnosis not present

## 2017-12-22 DIAGNOSIS — I471 Supraventricular tachycardia: Secondary | ICD-10-CM | POA: Diagnosis not present

## 2017-12-22 DIAGNOSIS — R059 Cough, unspecified: Secondary | ICD-10-CM

## 2017-12-22 LAB — COMPREHENSIVE METABOLIC PANEL
ALT: 33 U/L (ref 0–35)
AST: 35 U/L (ref 0–37)
Albumin: 4.1 g/dL (ref 3.5–5.2)
Alkaline Phosphatase: 107 U/L (ref 39–117)
BILIRUBIN TOTAL: 0.5 mg/dL (ref 0.2–1.2)
BUN: 16 mg/dL (ref 6–23)
CALCIUM: 10.1 mg/dL (ref 8.4–10.5)
CO2: 30 meq/L (ref 19–32)
CREATININE: 0.77 mg/dL (ref 0.40–1.20)
Chloride: 101 mEq/L (ref 96–112)
GFR: 79.45 mL/min (ref >60.00)
GLUCOSE: 221 mg/dL — AB (ref 70–99)
Potassium: 5.3 mEq/L — ABNORMAL HIGH (ref 3.5–5.1)
SODIUM: 142 meq/L (ref 135–145)
Total Protein: 7.4 g/dL (ref 6.0–8.3)

## 2017-12-22 LAB — CBC WITH DIFFERENTIAL/PLATELET
BASOS ABS: 0 10*3/uL (ref 0.0–0.1)
Basophils Relative: 0.3 % (ref 0.0–3.0)
EOS PCT: 0.7 % (ref 0.0–5.0)
Eosinophils Absolute: 0.1 10*3/uL (ref 0.0–0.7)
HCT: 39.3 % (ref 36.0–46.0)
HEMOGLOBIN: 12.2 g/dL (ref 12.0–15.0)
Lymphocytes Relative: 22.2 % (ref 12.0–46.0)
Lymphs Abs: 1.7 10*3/uL (ref 0.7–4.0)
MCHC: 31.1 g/dL (ref 30.0–36.0)
MCV: 80.8 fl (ref 78.0–100.0)
MONO ABS: 0.5 10*3/uL (ref 0.1–1.0)
MONOS PCT: 6.5 % (ref 3.0–12.0)
Neutro Abs: 5.3 10*3/uL (ref 1.4–7.7)
Neutrophils Relative %: 70.3 % (ref 43.0–77.0)
Platelets: 282 10*3/uL (ref 150.0–400.0)
RBC: 4.87 Mil/uL (ref 3.87–5.11)
RDW: 17.9 % — ABNORMAL HIGH (ref 11.5–15.5)
WBC: 7.5 10*3/uL (ref 4.0–10.5)

## 2017-12-22 LAB — TSH: TSH: 1.87 u[IU]/mL (ref 0.35–4.50)

## 2017-12-22 LAB — LIPID PANEL
CHOL/HDL RATIO: 3
Cholesterol: 125 mg/dL (ref 0–200)
HDL: 36.4 mg/dL — ABNORMAL LOW (ref >39.00)
LDL Cholesterol: 55 mg/dL (ref 0–99)
NONHDL: 88.59
Triglycerides: 166 mg/dL — ABNORMAL HIGH (ref 0.0–149.0)
VLDL: 33.2 mg/dL (ref 0.0–40.0)

## 2017-12-22 LAB — HEMOGLOBIN A1C: HEMOGLOBIN A1C: 8.7 % — AB (ref 4.6–6.5)

## 2017-12-22 MED ORDER — RANITIDINE HCL 150 MG PO TABS: 150.0000 mg | ORAL_TABLET | Freq: Every day | ORAL | 5 refills | Status: DC

## 2017-12-22 MED ORDER — LORATADINE 10 MG PO TABS: 10.0000 mg | ORAL_TABLET | Freq: Every day | ORAL | 11 refills | Status: DC

## 2017-12-22 MED ORDER — CEFDINIR 300 MG PO CAPS: 300.0000 mg | ORAL_CAPSULE | Freq: Two times a day (BID) | ORAL | 0 refills | Status: AC

## 2017-12-22 MED ORDER — FLUTICASONE PROPIONATE 50 MCG/ACT NA SUSP: 2.0000 | Freq: Every day | NASAL | 6 refills | Status: AC

## 2017-12-22 NOTE — Assessment & Plan Note (Signed)
Well controlled, no changes to meds. Encouraged heart healthy diet such as the DASH diet and exercise as tolerated.  °

## 2017-12-22 NOTE — Progress Notes (Signed)
Subjective:  pac  [name], female    DOB: 1950-11-23, 67 y.o.   MRN: [id number]  No chief complaint on file.   HPI   Patient is in today for a 3 month follow up. She is following up on her HTN, DM, hyperlipidemia and other medical concerns. She has no acute concerns today. No recent febrile illness or acute hospitalizations. Denies CP/palp/SOB/HA/congestion/fevers/GI or GU c/o. Taking meds as prescribed. She is struggling with head congestion, rhinorrhea, cough, PND and malaise. Notes fatigue and ongoing back pain. She is noting some increased trouble with swallowing lately. Denies CP/palp/SOB/HA/congestion/fevers or GU c/o. Taking meds as prescribed  Patient Care Team: [name], MD as PCP - General (Family Medicine) Fanny Skates, MD as Consulting Physician (General Surgery) Nicholas Lose, MD as Consulting Physician (Hematology and Oncology) Gery Pray, MD as Consulting Physician (Radiation Oncology) Delice Bison, Charlestine Massed, NP as Nurse Practitioner (Hematology and Oncology) Lazaro Arms, RN as [facility]   Past Medical History:  Diagnosis Date  . Anemia 04/05/2012  . Anxiety   . Anxiety state 09/11/2007   Qualifier: Diagnosis of  By: Scherrie Gerlach    . Atrial tachycardia, paroxysmal ([place])   . Back pain 10/02/2014  . Breast cancer ([place]) 02/17/2017  . Chicken pox as a child  . Chronic diastolic CHF (congestive heart failure), NYHA class 1 ([place])   . Complication of anesthesia    pt states feels different in her body after anesthesia when waking up and also experiences a smell of burnt plastic for approx a wk   . Dilated aortic root ([place])    51m by echo 09/2016  . Dysuria 02/17/2017  . Elevated LFTs 04/05/2012  . GERD (gastroesophageal reflux disease)   . Goiter   . Heart murmur    hx of one at birth   . History of kidney stones   . History of radiation therapy 10/31/16-12/17/16   left breast 45 Gy in 25 fractions,  left breast boost 16 Gy in 8 fractions  . Hx of colonic polyps   . Hyperlipidemia   . Hypertension   . Insomnia 10/08/2016  . Malignant neoplasm of upper-outer quadrant of left female breast ([facility] 05/14/2016   not with patient  . Measles as a child  . Mumps as a child  . OA (osteoarthritis) of knee 04/05/2012  . Obesity 07/11/2014  . PONV (postoperative nausea and vomiting)   . Preventative health care 09/05/2013  . PVC's (premature ventricular contractions) 05/02/2016  . Shortness of breath dyspnea    walking distances / climbing stairs  . Sleep apnea 04/05/2012  . Tinea corporis 02/23/2013  . Type 2 diabetes mellitus with hyperglycemia ([facility] 12/31/2013  . Vasomotor rhinitis 04/05/2012  . Ventral hernia     Past Surgical History:  Procedure Laterality Date  . BREAST LUMPECTOMY WITH RADIOACTIVE SEED AND SENTINEL LYMPH NODE BIOPSY Left 06/21/2016   Procedure: LEFT BREAST LUMPECTOMY WITH RADIOACTIVE SEED AND SENTINEL LYMPH NODE BIOPSY, INJECT BLUE DYE LEFT BREAST;  Surgeon: HFanny Skates MD;  [place] [facility]  Service: General;  Laterality: Left;  . CARDIAC CATHETERIZATION     normal coroary arteries per patient  . CARDIOVASCULAR STRESS TEST     10/12/2013  . CESAREAN SECTION     X 3  . CHOLECYSTECTOMY    . COLONOSCOPY WITH PROPOFOL N/A 03/28/2015   Procedure: COLONOSCOPY WITH PROPOFOL;  Surgeon: JJuanita Craver MD;  Location: [facility];  Service: Endoscopy;  Laterality: N/A;  . HERNIA REPAIR  02/08/11   ventral hernia  . JOINT REPLACEMENT     bilateral  . KNEE ARTHROSCOPY  05/2010   bilateral  . LEFT AND RIGHT HEART CATHETERIZATION WITH CORONARY ANGIOGRAM N/A 11/08/2013   Procedure: LEFT AND RIGHT HEART CATHETERIZATION WITH CORONARY ANGIOGRAM;  Surgeon: [name], MD;  Location: [facility] CATH LAB;  Service: Cardiovascular;  Laterality: N/A;  . MENISCUS REPAIR  2009  . MOUTH SURGERY     teeth implants  . PORT-A-CATH REMOVAL N/A 01/24/2017   Procedure: REMOVAL PORT-A-CATH;   Surgeon: Fanny Skates, MD;  Location: [facility];  Service: General;  Laterality: N/A;  . PORTACATH PLACEMENT Right 07/16/2016   Procedure: INSERTION PORT-A-CATH RIGHT INTERNAL JUGULAR WITH ULTRASOUND;  Surgeon: Fanny Skates, MD;  Location: [facility];  Service: General;  Laterality: Right;  . TOTAL KNEE ARTHROPLASTY  2011   left  . TOTAL KNEE ARTHROPLASTY Right 05/10/2014   Procedure: RIGHT TOTAL KNEE ARTHROPLASTY;  Surgeon: Mauri Pole, MD;  Location: [facility];  Service: Orthopedics;  Laterality: Right;  . TUBAL LIGATION    . WISDOM TOOTH EXTRACTION  2000    Family History  Problem Relation Age of Onset  . Thyroid disease Mother   . Hyperlipidemia Mother   . Heart attack Father 23  . Hypertension Father   . Arthritis Father        RA  . Coronary artery disease Father   . Coronary artery disease Brother   . Heart disease Brother   . Cancer Maternal Aunt        colon  . Cancer Maternal Grandmother        colon  . Heart attack Maternal Grandfather   . Alcohol abuse Paternal Grandfather     Social History   Socioeconomic History  . Marital status: Divorced    Spouse name: Not on file  . Number of children: 3  . Years of education: Not on file  . Highest education level: Not on file  Occupational History  . Occupation: retired - [employer]  Social Needs  . Financial resource strain: Not on file  . Food insecurity:    Worry: Not on file    Inability: Not on file  . Transportation needs:    Medical: Not on file    Non-medical: Not on file  Tobacco Use  . Smoking status: Never Smoker  . Smokeless tobacco: Never Used  Substance and Sexual Activity  . Alcohol use: Yes    Comment: rarely  . Drug use: No  . Sexual activity: Never    Comment: lives with mother currently-stress  Lifestyle  . Physical activity:    Days per week: Not on file    Minutes per session: Not on file  . Stress: Not on file  Relationships  . Social connections:    Talks on phone: Not on file     Gets together: Not on file    Attends religious service: Not on file    Active member of club or organization: Not on file    Attends meetings of clubs or organizations: Not on file    Relationship status: Not on file  . Intimate partner violence:    Fear of current or ex partner: Not on file    Emotionally abused: Not on file    Physically abused: Not on file    Forced sexual activity: Not on file  Other Topics Concern  . Not on file  Social History  Narrative  . Not on file    Outpatient Medications Prior to Visit  Medication Sig Dispense Refill  . acetaminophen (TYLENOL) 500 MG tablet Take 1,000 mg by mouth 2 (two) times daily as needed for moderate pain or headache.    . albuterol (PROVENTIL HFA;VENTOLIN HFA) 108 (90 Base) MCG/ACT inhaler Inhale 2 puffs into the lungs every 6 (six) hours as needed for wheezing or shortness of breath. 1 Inhaler 12  . anastrozole (ARIMIDEX) 1 MG tablet Take 1 tablet (1 mg total) by mouth daily. 90 tablet 3  . aspirin EC 81 MG tablet Take 81 mg by mouth at bedtime.     . diclofenac (VOLTAREN) 75 MG EC tablet TAKE 1 TABLET TWICE DAILY 180 tablet 1  . diltiazem (CARTIA XT) 120 MG 24 hr capsule Take 1 capsule (120 mg total) by mouth daily. 90 capsule 2  . escitalopram (LEXAPRO) 20 MG tablet TAKE 1 TABLET (20 MG TOTAL) BY MOUTH DAILY. 90 tablet 1  . esomeprazole (NEXIUM) 40 MG capsule Take 1 capsule (40 mg total) by mouth daily. 90 capsule 2  . ezetimibe (ZETIA) 10 MG tablet Take 1 tablet (10 mg total) by mouth daily. 90 tablet 3  . fluticasone furoate-vilanterol (BREO ELLIPTA) 100-25 MCG/INH AEPB Inhale 1 puff into the lungs daily. 1 each 11  . furosemide (LASIX) 20 MG tablet Take 1 tablet (20 mg total) by mouth daily. 90 tablet 2  . Hypromellose (ARTIFICIAL TEARS OP) Place 1 drop into both eyes daily as needed (dry eyes).     Marland Kitchen losartan (COZAAR) 100 MG tablet Take 1 tablet (100 mg total) by mouth daily. 90 tablet 2  . metFORMIN (GLUCOPHAGE) 500 MG tablet  Take 1 tablet (500 mg total) by mouth 2 (two) times daily with a meal. 180 tablet 3  . montelukast (SINGULAIR) 10 MG tablet Take 1 tablet (10 mg total) by mouth at bedtime. 90 tablet 2  . nebivolol (BYSTOLIC) 10 MG tablet Take 1 tablet (10 mg total) by mouth at bedtime. 90 tablet 2  . Probiotic Product (PROBIOTIC PO) Take 1 capsule by mouth at bedtime.    . rosuvastatin (CRESTOR) 40 MG tablet Take 1 tablet (40 mg total) by mouth daily. 90 tablet 3   No facility-administered medications prior to visit.     Allergies  Allergen Reactions  . No Known Allergies     Review of Systems  Constitutional: Positive for malaise/fatigue. Negative for fever.  HENT: Positive for congestion.   Eyes: Negative for blurred vision.  Respiratory: Positive for cough and sputum production. Negative for shortness of breath.   Cardiovascular: Negative for chest pain, palpitations and leg swelling.  Gastrointestinal: Positive for heartburn. Negative for vomiting.  Musculoskeletal: Positive for myalgias. Negative for back pain.  Skin: Negative for rash.  Neurological: Negative for loss of consciousness and headaches.  Psychiatric/Behavioral: Positive for depression. The patient is nervous/anxious.        Objective:    Physical Exam  Constitutional: She is oriented to person, place, and time. She appears well-developed and well-nourished. No distress.  HENT:  Head: Normocephalic and atraumatic.  Eyes: Conjunctivae are normal.  Neck: Normal range of motion. No thyromegaly present.  Cardiovascular: Normal rate and regular rhythm.  No murmur heard. Pulmonary/Chest: Effort normal and breath sounds normal. She has no wheezes.  Abdominal: Soft. Bowel sounds are normal. There is no tenderness.  Musculoskeletal: Normal range of motion. She exhibits no edema or deformity.  Neurological: She is alert and oriented to  person, place, and time.  Skin: Skin is warm and dry. She is not diaphoretic.  Psychiatric: She  has a normal mood and affect.    BP (!) 142/70 (BP Location: Left Arm, Patient Position: Sitting, Cuff Size: Large)   Pulse 74   Temp 98.5 F (36.9 C) (Oral)   Resp 18   Wt (!) 324 lb (147 kg)   SpO2 94%   BMI 52.29 kg/m  Wt Readings from Last 3 Encounters:  12/22/17 (!) 324 lb (147 kg)  10/06/17 (!) 318 lb 1.6 oz (144.3 kg)  09/22/17 (!) 319 lb 9.6 oz (145 kg)   BP Readings from Last 3 Encounters:  12/22/17 (!) 142/70  10/06/17 (!) 149/89  09/22/17 (!) 142/90     Immunization History  Administered Date(s) Administered  . Influenza Split 03/24/2012, 03/24/2013  . Influenza, High Dose Seasonal PF 04/18/2016, 04/21/2017  . Influenza-Unspecified 03/21/2014, 03/15/2015, 03/18/2015  . Pneumococcal Conjugate-13 04/18/2016  . Pneumococcal Polysaccharide-23 05/22/2017  . Tdap 06/24/2008  . Zoster 04/19/2011    Health Maintenance  Topic Date Due  . FOOT EXAM  11/21/1960  . OPHTHALMOLOGY EXAM  11/21/1960  . INFLUENZA VACCINE  01/22/2018  . HEMOGLOBIN A1C  06/24/2018  . TETANUS/TDAP  06/24/2018  . MAMMOGRAM  05/13/2019  . COLONOSCOPY  03/27/2025  . DEXA SCAN  Completed  . Hepatitis C Screening  Completed  . PNA vac Low Risk Adult  Completed    Lab Results  Component Value Date   WBC 7.5 12/22/2017   HGB 12.2 12/22/2017   HCT 39.3 12/22/2017   PLT 282.0 12/22/2017   GLUCOSE 221 (H) 12/22/2017   CHOL 125 12/22/2017   TRIG 166.0 (H) 12/22/2017   HDL 36.40 (L) 12/22/2017   LDLDIRECT 108.0 10/08/2016   LDLCALC 55 12/22/2017   ALT 33 12/22/2017   AST 35 12/22/2017   NA 142 12/22/2017   K 5.3 (H) 12/22/2017   CL 101 12/22/2017   CREATININE 0.77 12/22/2017   BUN 16 12/22/2017   CO2 30 12/22/2017   TSH 1.87 12/22/2017   INR 1.01 05/03/2014   HGBA1C 8.7 (H) 12/22/2017    Lab Results  Component Value Date   TSH 1.87 12/22/2017   Lab Results  Component Value Date   WBC 7.5 12/22/2017   HGB 12.2 12/22/2017   HCT 39.3 12/22/2017   MCV 80.8 12/22/2017   PLT  282.0 12/22/2017   Lab Results  Component Value Date   NA 142 12/22/2017   K 5.3 (H) 12/22/2017   CHLORIDE 107 04/11/2017   CO2 30 12/22/2017   GLUCOSE 221 (H) 12/22/2017   BUN 16 12/22/2017   CREATININE 0.77 12/22/2017   BILITOT 0.5 12/22/2017   ALKPHOS 107 12/22/2017   AST 35 12/22/2017   ALT 33 12/22/2017   PROT 7.4 12/22/2017   ALBUMIN 4.1 12/22/2017   CALCIUM 10.1 12/22/2017   ANIONGAP 10 10/06/2017   EGFR >60 04/11/2017   GFR 79.45 12/22/2017   Lab Results  Component Value Date   CHOL 125 12/22/2017   Lab Results  Component Value Date   HDL 36.40 (L) 12/22/2017   Lab Results  Component Value Date   LDLCALC 55 12/22/2017   Lab Results  Component Value Date   TRIG 166.0 (H) 12/22/2017   Lab Results  Component Value Date   CHOLHDL 3 12/22/2017   Lab Results  Component Value Date   HGBA1C 8.7 (H) 12/22/2017         Assessment & Plan:  Problem List Items Addressed This Visit    Hyperlipidemia    Encouraged heart healthy diet, increase exercise, avoid trans fats, consider a krill oil cap daily      Relevant Orders   Lipid panel (Completed)   Anxiety and depression - Primary    She is struggling with ongoing depression and grief since her mother died. She is encouraged to start counseling and for now we will not increase Lexapro but may need to increase dosing if symptoms continue.       Relevant Orders   Ambulatory referral to [place] hypertension    Well controlled, no changes to meds. Encouraged heart healthy diet such as the DASH diet and exercise as tolerated.       Relevant Orders   Hemoglobin A1c (Completed)   Comprehensive metabolic panel (Completed)   TSH (Completed)   Acute sinusitis with symptoms > 10 days    Right sided symptoms with headache, congestion, green sputum. Will try a course of antibiotics      Relevant Medications   cefdinir (OMNICEF) 300 MG capsule   loratadine (CLARITIN) 10 MG tablet    fluticasone (FLONASE) 50 MCG/ACT nasal spray   Other Relevant Orders   CBC with Differential/Platelet (Completed)   Type 2 diabetes mellitus with hyperglycemia ([facility])    hgba1c acceptable, minimize simple carbs. Increase exercise as tolerated.       Relevant Orders   Hemoglobin A1c (Completed)   Atrial tachycardia, paroxysmal ([facility])    RRR today      Obesity    Encouraged DASH diet, decrease po intake and increase exercise as tolerated. Needs 7-8 hours of sleep nightly. Avoid trans fats, eat small, frequent meals every 4-5 hours with lean proteins, complex carbs and healthy fats. Minimize simple carbs      Relevant Orders   Amb Ref to Medical Weight Management   Breast cancer ([place])    Struggling with hot flashes with Anastrozole so bad she has trouble going to church. Try Clonidine 0.1 mg prior to going to church      Relevant Medications   cefdinir (OMNICEF) 300 MG capsule   Asthmatic bronchitis, mild intermittent, uncomplicated    Notes wheezing and trouble breathing worst in am.       Dysphagia    Patient noting some trouble with swallowing and food catching at times and has persistent trouble with a cough. Referred to gastroenterology for further evaluation and continue Nexium 40 mg daily.       Relevant Orders   Ambulatory referral to Gastroenterology   Cough   Relevant Orders   Ambulatory referral to Gastroenterology      I am having Dixie. Tomasita Crumble start on cefdinir, loratadine, ranitidine, and fluticasone. I am also having her maintain her aspirin EC, Hypromellose (ARTIFICIAL TEARS OP), acetaminophen, Probiotic Product (PROBIOTIC PO), fluticasone furoate-vilanterol, albuterol, losartan, anastrozole, nebivolol, metFORMIN, diltiazem, rosuvastatin, ezetimibe, montelukast, furosemide, esomeprazole, diclofenac, and escitalopram.  Meds ordered this encounter  Medications  . cefdinir (OMNICEF) 300 MG capsule    Sig: Take 1 capsule (300 mg total) by mouth 2 (two) times  daily for 10 days.    Dispense:  20 capsule    Refill:  0  . loratadine (CLARITIN) 10 MG tablet    Sig: Take 1 tablet (10 mg total) by mouth daily.    Dispense:  30 tablet    Refill:  11  . ranitidine (ZANTAC) 150 MG tablet    Sig: Take 1 tablet (150  mg total) by mouth at bedtime.    Dispense:  30 tablet    Refill:  5  . fluticasone (FLONASE) 50 MCG/ACT nasal spray    Sig: Place 2 sprays into both nostrils daily.    Dispense:  16 g    Refill:  6    CMA served as scribe during this visit. History, Physical and Plan performed by medical provider. Documentation and orders reviewed and attested to.  Penni Homans, MD

## 2017-12-22 NOTE — Assessment & Plan Note (Addendum)
Struggling with hot flashes with Anastrozole so bad she has trouble going to church. Try Clonidine 0.1 mg prior to going to church

## 2017-12-22 NOTE — Assessment & Plan Note (Signed)
Notes wheezing and trouble breathing worst in am.

## 2017-12-22 NOTE — Patient Instructions (Signed)
Mucinex twice daily while on antibiotic DASH Eating Plan DASH stands for "Dietary Approaches to Stop Hypertension." The DASH eating plan is a healthy eating plan that has been shown to reduce high blood pressure (hypertension). It may also reduce your risk for type 2 diabetes, heart disease, and stroke. The DASH eating plan may also help with weight loss. What are tips for following this plan? General guidelines  Avoid eating more than 2,300 mg (milligrams) of salt (sodium) a day. If you have hypertension, you may need to reduce your sodium intake to 1,500 mg a day.  Limit alcohol intake to no more than 1 drink a day for nonpregnant women and 2 drinks a day for men. One drink equals 12 oz of beer, 5 oz of wine, or 1 oz of hard liquor.  Work with your health care provider to maintain a healthy body weight or to lose weight. Ask what an ideal weight is for you.  Get at least 30 minutes of exercise that causes your heart to beat faster (aerobic exercise) most days of the week. Activities may include walking, swimming, or biking.  Work with your health care provider or diet and nutrition specialist (dietitian) to adjust your eating plan to your individual calorie needs. Reading food labels  Check food labels for the amount of sodium per serving. Choose foods with less than 5 percent of the Daily Value of sodium. Generally, foods with less than 300 mg of sodium per serving fit into this eating plan.  To find whole grains, look for the word "whole" as the first word in the ingredient list. Shopping  Buy products labeled as "low-sodium" or "no salt added."  Buy fresh foods. Avoid canned foods and premade or frozen meals. Cooking  Avoid adding salt when cooking. Use salt-free seasonings or herbs instead of table salt or sea salt. Check with your health care provider or pharmacist before using salt substitutes.  Do not fry foods. Cook foods using healthy methods such as baking, boiling,  grilling, and broiling instead.  Cook with heart-healthy oils, such as olive, canola, soybean, or sunflower oil. Meal planning   Eat a balanced diet that includes: ? 5 or more servings of fruits and vegetables each day. At each meal, try to fill half of your plate with fruits and vegetables. ? Up to 6-8 servings of whole grains each day. ? Less than 6 oz of lean meat, poultry, or fish each day. A 3-oz serving of meat is about the same size as a deck of cards. One egg equals 1 oz. ? 2 servings of low-fat dairy each day. ? A serving of nuts, seeds, or beans 5 times each week. ? Heart-healthy fats. Healthy fats called Omega-3 fatty acids are found in foods such as flaxseeds and coldwater fish, like sardines, salmon, and mackerel.  Limit how much you eat of the following: ? Canned or prepackaged foods. ? Food that is high in trans fat, such as fried foods. ? Food that is high in saturated fat, such as fatty meat. ? Sweets, desserts, sugary drinks, and other foods with added sugar. ? Full-fat dairy products.  Do not salt foods before eating.  Try to eat at least 2 vegetarian meals each week.  Eat more home-cooked food and less restaurant, buffet, and fast food.  When eating at a restaurant, ask that your food be prepared with less salt or no salt, if possible. What foods are recommended? The items listed may not be a complete list.  Talk with your dietitian about what dietary choices are best for you. Grains Whole-grain or whole-wheat bread. Whole-grain or whole-wheat pasta. Brown rice. [place]ena Morrow. Bulgur. Whole-grain and low-sodium cereals. Pita bread. Low-fat, low-sodium crackers. Whole-wheat flour tortillas. Vegetables Fresh or frozen vegetables (raw, steamed, roasted, or grilled). Low-sodium or reduced-sodium tomato and vegetable juice. Low-sodium or reduced-sodium tomato sauce and tomato paste. Low-sodium or reduced-sodium canned vegetables. Fruits All fresh, dried, or frozen  fruit. Canned fruit in natural juice (without added sugar). Meat and other protein foods Skinless chicken or [place]. Ground chicken or [place]. Pork with fat trimmed off. Fish and seafood. Egg whites. Dried beans, peas, or lentils. Unsalted nuts, nut butters, and seeds. Unsalted canned beans. Lean cuts of beef with fat trimmed off. Low-sodium, lean deli meat. Dairy Low-fat (1%) or fat-free (skim) milk. Fat-free, low-fat, or reduced-fat cheeses. Nonfat, low-sodium ricotta or cottage cheese. Low-fat or nonfat yogurt. Low-fat, low-sodium cheese. Fats and oils Soft margarine without trans fats. Vegetable oil. Low-fat, reduced-fat, or light mayonnaise and salad dressings (reduced-sodium). Canola, safflower, olive, soybean, and sunflower oils. Avocado. Seasoning and other foods Herbs. Spices. Seasoning mixes without salt. Unsalted popcorn and pretzels. Fat-free sweets. What foods are not recommended? The items listed may not be a complete list. Talk with your dietitian about what dietary choices are best for you. Grains Baked goods made with fat, such as croissants, muffins, or some breads. Dry pasta or rice meal packs. Vegetables Creamed or fried vegetables. Vegetables in a cheese sauce. Regular canned vegetables (not low-sodium or reduced-sodium). Regular canned tomato sauce and paste (not low-sodium or reduced-sodium). Regular tomato and vegetable juice (not low-sodium or reduced-sodium). Angie Fava. Olives. Fruits Canned fruit in a light or heavy syrup. Fried fruit. Fruit in cream or butter sauce. Meat and other protein foods Fatty cuts of meat. Ribs. Fried meat. Berniece Salines. Sausage. Bologna and other processed lunch meats. Salami. Fatback. Hotdogs. Bratwurst. Salted nuts and seeds. Canned beans with added salt. Canned or smoked fish. Whole eggs or egg yolks. Chicken or [place] with skin. Dairy Whole or 2% milk, cream, and half-and-half. Whole or full-fat cream cheese. Whole-fat or sweetened yogurt. Full-fat  cheese. Nondairy creamers. Whipped toppings. Processed cheese and cheese spreads. Fats and oils Butter. Stick margarine. Lard. Shortening. Ghee. Bacon fat. Tropical oils, such as coconut, palm kernel, or palm oil. Seasoning and other foods Salted popcorn and pretzels. Onion salt, garlic salt, seasoned salt, table salt, and sea salt. Worcestershire sauce. Tartar sauce. Barbecue sauce. Teriyaki sauce. Soy sauce, including reduced-sodium. Steak sauce. Canned and packaged gravies. Fish sauce. Oyster sauce. Cocktail sauce. Horseradish that you find on the shelf. Ketchup. Mustard. Meat flavorings and tenderizers. Bouillon cubes. Hot sauce and Tabasco sauce. Premade or packaged marinades. Premade or packaged taco seasonings. Relishes. Regular salad dressings. Where to find more information:  National Heart, Lung, and [place]: https://wilson-eaton.com/  American Heart Association: www.heart.org Summary  The DASH eating plan is a healthy eating plan that has been shown to reduce high blood pressure (hypertension). It may also reduce your risk for type 2 diabetes, heart disease, and stroke.  With the DASH eating plan, you should limit salt (sodium) intake to 2,300 mg a day. If you have hypertension, you may need to reduce your sodium intake to 1,500 mg a day.  When on the DASH eating plan, aim to eat more fresh fruits and vegetables, whole grains, lean proteins, low-fat dairy, and heart-healthy fats.  Work with your health care provider or diet and nutrition specialist (dietitian) to adjust  your eating plan to your individual calorie needs. This information is not intended to replace advice given to you by your health care provider. Make sure you discuss any questions you have with your health care provider. Document Released: 05/30/2011 Document Revised: 06/03/2016 Document Reviewed: 06/03/2016 Elsevier Interactive Patient Education  [employer].

## 2017-12-22 NOTE — Assessment & Plan Note (Signed)
Encouraged heart healthy diet, increase exercise, avoid trans fats, consider a krill oil cap daily 

## 2017-12-22 NOTE — Assessment & Plan Note (Signed)
Right sided symptoms with headache, congestion, green sputum. Will try a course of antibiotics

## 2017-12-22 NOTE — Assessment & Plan Note (Signed)
RRR today 

## 2017-12-22 NOTE — Assessment & Plan Note (Signed)
hgba1c acceptable, minimize simple carbs. Increase exercise as tolerated.  

## 2017-12-28 DIAGNOSIS — R059 Cough, unspecified: Secondary | ICD-10-CM | POA: Insufficient documentation

## 2017-12-28 DIAGNOSIS — R1310 Dysphagia, unspecified: Secondary | ICD-10-CM | POA: Insufficient documentation

## 2017-12-28 DIAGNOSIS — R05 Cough: Secondary | ICD-10-CM | POA: Insufficient documentation

## 2017-12-28 NOTE — Assessment & Plan Note (Signed)
Patient noting some trouble with swallowing and food catching at times and has persistent trouble with a cough. Referred to gastroenterology for further evaluation and continue Nexium 40 mg daily.

## 2017-12-28 NOTE — Assessment & Plan Note (Signed)
Encouraged DASH diet, decrease po intake and increase exercise as tolerated. Needs 7-8 hours of sleep nightly. Avoid trans fats, eat small, frequent meals every 4-5 hours with lean proteins, complex carbs and healthy fats. Minimize simple carbs 

## 2017-12-28 NOTE — Assessment & Plan Note (Signed)
She is struggling with ongoing depression and grief since her mother died. She is encouraged to start counseling and for now we will not increase Lexapro but may need to increase dosing if symptoms continue.

## 2018-01-07 ENCOUNTER — Encounter: Payer: Self-pay | Admitting: Internal Medicine

## 2018-01-08 ENCOUNTER — Ambulatory Visit: Payer: Medicare HMO | Admitting: Internal Medicine

## 2018-01-08 ENCOUNTER — Encounter: Payer: Self-pay | Admitting: Internal Medicine

## 2018-01-08 VITALS — BP 130/72 | HR 71 | Ht 65.0 in | Wt 326.4 lb

## 2018-01-08 DIAGNOSIS — J4520 Mild intermittent asthma, uncomplicated: Secondary | ICD-10-CM | POA: Diagnosis not present

## 2018-01-08 DIAGNOSIS — G4733 Obstructive sleep apnea (adult) (pediatric): Secondary | ICD-10-CM

## 2018-01-08 MED ORDER — FLUTICASONE-UMECLIDIN-VILANT 100-62.5-25 MCG/INH IN AEPB: 1.0000 | INHALATION_SPRAY | Freq: Every day | RESPIRATORY_TRACT | 0 refills | Status: DC

## 2018-01-08 NOTE — Progress Notes (Signed)
HPI female never smoker followed for OSA, lung restriction, dyspnea on exertion, asthmatic bronchitis, complicated by chronic sinusitis, anxiety and depression, paroxysmal atrial tachycardia, CAD, breast cancer left, morbid obesity Unattended Home Sleep Test 11/05/13-AHI 68.1/hour, desaturation to 62%, body weight 320 pounds Echocardiogram 12/04/16- Nl EF Office Spirometry 02/06/17-moderate restriction of exhaled volume. FVC 2.02/61%, FEV1 1.75/69%, ratio 0.86, FEF 25-75% 2.45/113% PFT- 03/12/17-moderate obstructive airways disease with significant response to bronchodilator, normal diffusion. FVC 2.25/69%, FEV1 1.88/76%, ratio 0.84, TLC 80%, DLCO 86% --------------------------------------------------------------------------------------------.  04/21/17-- 67 year old female never smoker followed for OSA, lung restriction, dyspnea on exertion, asthmatic bronchitis, complicated by chronic sinusitis, anxiety and depression, paroxysmal atrial tachycardia, CAD, breast cancer left, morbid obesity, goiter CPAP auto 5-15/Advanced Main concern has been dyspnea. We had changed from Flovent to Sylva. DOE-Pt had PFT 02-2017-go over in greater detail. Pt has drainage in throat today that is causing a cough. Otherwise feels that her breathing is better.  Singulair, Breo 100- Eyes have been watery, retro-orbital facial pain, right frontal headache, increase sinus drainage turning green. History of recurrent sinusitis in the past. "Junk in throat". No fever. Memory Dance has kept her breathing much better. She has albuterol inhaler rarely needed. She is quite satisfied with ongoing CPAP use-no download available today. PFT- 03/12/17-moderate obstructive airways disease with significant response to bronchodilator, normal diffusion. FVC 2.25/69%, FEV1 1.88/76%, ratio 0.84, TLC 80%, DLCO 86%  01/08/2018- 67 year old female never smoker followed for OSA, lung restriction, dyspnea on exertion, asthmatic bronchitis, complicated by  chronic sinusitis, anxiety and depression, paroxysmal atrial tachycardia, CAD, breast cancer left, morbid obesity, goiter,  CPAP auto 5-15/Advanced Singulair, Breo 100, albuterol HFA, -----Breathing is not doing well she is wheezing, she has recently had a sinus infection and put on ABX for that. Cpap is going well. Weight today 326 pounds Notices more wheeze with exertion, especially if outdoors in this hot weather.  Little sputum.  Rescue inhaler is used once or twice daily and does help. CPAP doing very well except she still notes a scorched area on the humidifier which she has discussed with her DME company. Download 100% compliance AHI 0.5/hour  ROS-see HPI   + = Positive Constitutional:    weight loss, night sweats, fevers, chills, fatigue, lassitude. HEENT:   + headaches, difficulty swallowing, tooth/dental problems, sore throat,       sneezing, itching, ear ache, + nasal congestion, post nasal drip, snoring CV:    chest pain, orthopnea, PND, swelling in lower extremities, anasarca,                                                  dizziness, palpitations Resp:   + shortness of breath with exertion or at rest.                productive cough,   non-productive cough, coughing up of blood.              change in color of mucus.  + wheezing.   Skin:    rash or lesions. GI:  No-   heartburn, indigestion, abdominal pain, nausea, vomiting, diarrhea,                 change in bowel habits, loss of appetite GU: dysuria, change in color of urine, no urgency or frequency.   flank pain. MS:   joint pain, stiffness, decreased range  of motion, back pain. Neuro-     nothing unusual Psych:  change in mood or affect.  depression or anxiety.   memory loss.  OBJ- Physical Exam General- Alert, Oriented, Affect-appropriate, Distress- none acute, + morbidly obese Skin- rash-none, lesions- none, excoriation- none Lymphadenopathy- none Head- atraumatic,             Eyes- Gross vision intact, PERRLA,  conjunctivae and secretions clear            Ears- Hearing, canals-normal            Nose- + turbinate edema, no-Septal dev, mucus, polyps, erosion, perforation             Throat- Mallampati III , mucosa clear , drainage- none, tonsils- atrophic Neck- flexible , trachea midline , thyroid R mass identified as" goiter" , carotid no bruit Chest - symmetrical excursion , unlabored           Heart/CV- RRR , no murmur , no gallop  , no rub, nl s1 s2                           - JVD- none , edema- none, stasis changes- none, varices- none           Lung- clear,  wheeze- none, cough- none , dullness-none, rub- none           Chest wall-  Abd-  Br/ Gen/ Rectal- Not done, not indicated Extrem- cyanosis- none, clubbing, none, atrophy- none, strength- nl Neuro- grossly intact to observation

## 2018-01-08 NOTE — Patient Instructions (Signed)
Sample x 2 Trelegy inhaler     Inhale 1 puff then rinse mouth, once daily    Try this instead of Breo. When it runs out, go back to [place] for comparison. See if one helps more with the wheeze.   We can continue CPAP auto 5-15, mask of choice humidifier, supplies, AirView.  You can show your home care company the scorch mark on your CPAP reservoir.  Please call as needed

## 2018-01-12 ENCOUNTER — Other Ambulatory Visit: Payer: Self-pay | Admitting: Family Medicine

## 2018-01-13 ENCOUNTER — Other Ambulatory Visit: Payer: Self-pay

## 2018-01-13 NOTE — Patient Outreach (Signed)
[facility]) Care Management  [place]  01/13/2018   [name] September 10, 1950 [id number]  Subjective: Successful outreach to the patient for assessment. HIPAA verified.  The patient is doing fair.  She reports that her mother passed away in 10-06-2022 and it has been hard for her.  I offered her my condolences.  She has seen her physician with her concerns.  She has been scheduled an appointment to see a counselor at the end of this month.  She states the she will be going to the beach with her daughter and grandchildren.  To get away for a while will help her greatly.  She plans on staying with her daughter for a couple of weeks.  The patient states that she is active in her home.  She denies any falls or pain.  She has been adherent with her medications.  She states that she has been having problems with her breathing in this heat.  She has seen her pulmonologist.  He has started her on Trelegy.  She states that she can feel a difference.  I advise to stay cool and hydrated.  She verbalized understanding.  She had an appointment with Dr Charlett Blake and states overall she is doing well.  Her a1c was 8.7.  The patient was encouraged to monitor her food intake, take her medications as prescribed and exercise as possible.  Patient verbalized understanding.  Her next appointment with Dr Charlett Blake will be in October.    Encounter Medications:  Outpatient Encounter Medications as of 01/13/2018  Medication Sig  . acetaminophen (TYLENOL) 500 MG tablet Take 1,000 mg by mouth 2 (two) times daily as needed for moderate pain or headache.  . albuterol (PROVENTIL HFA;VENTOLIN HFA) 108 (90 Base) MCG/ACT inhaler Inhale 2 puffs into the lungs every 6 (six) hours as needed for wheezing or shortness of breath.  . anastrozole (ARIMIDEX) 1 MG tablet Take 1 tablet (1 mg total) by mouth daily.  Marland Kitchen aspirin EC 81 MG tablet Take 81 mg by mouth at bedtime.   Marland Kitchen BYSTOLIC 10 MG tablet TAKE 1 TABLET (10 MG TOTAL)  BY MOUTH AT BEDTIME.  Marland Kitchen diclofenac (VOLTAREN) 75 MG EC tablet TAKE 1 TABLET TWICE DAILY  . diltiazem (CARTIA XT) 120 MG 24 hr capsule Take 1 capsule (120 mg total) by mouth daily.  Marland Kitchen escitalopram (LEXAPRO) 20 MG tablet TAKE 1 TABLET (20 MG TOTAL) BY MOUTH DAILY.  Marland Kitchen esomeprazole (NEXIUM) 40 MG capsule Take 1 capsule (40 mg total) by mouth daily.  Marland Kitchen ezetimibe (ZETIA) 10 MG tablet Take 1 tablet (10 mg total) by mouth daily.  . fluticasone (FLONASE) 50 MCG/ACT nasal spray Place 2 sprays into both nostrils daily.  . fluticasone furoate-vilanterol (BREO ELLIPTA) 100-25 MCG/INH AEPB Inhale 1 puff into the lungs daily.  . Fluticasone-Umeclidin-Vilant (TRELEGY ELLIPTA) 100-62.5-25 MCG/INH AEPB Inhale 1 puff into the lungs daily.  . furosemide (LASIX) 20 MG tablet Take 1 tablet (20 mg total) by mouth daily.  . Hypromellose (ARTIFICIAL TEARS OP) Place 1 drop into both eyes daily as needed (dry eyes).   Marland Kitchen loratadine (CLARITIN) 10 MG tablet Take 1 tablet (10 mg total) by mouth daily.  Marland Kitchen losartan (COZAAR) 100 MG tablet Take 1 tablet (100 mg total) by mouth daily.  . metFORMIN (GLUCOPHAGE) 500 MG tablet Take 1 tablet (500 mg total) by mouth 2 (two) times daily with a meal.  . montelukast (SINGULAIR) 10 MG tablet Take 1 tablet (10 mg total) by mouth at bedtime.  Marland Kitchen  Probiotic Product (PROBIOTIC PO) Take 1 capsule by mouth at bedtime.  . ranitidine (ZANTAC) 150 MG tablet Take 1 tablet (150 mg total) by mouth at bedtime.  . rosuvastatin (CRESTOR) 40 MG tablet Take 1 tablet (40 mg total) by mouth daily.   No facility-administered encounter medications on file as of 01/13/2018.     Functional Status:  In your present state of health, do you have any difficulty performing the following activities: 04/22/2017 01/22/2017  Hearing? N Y  Comment - age related hearing loss  Vision? N N  Comment wearing glasses. eye doctor yearly.  -  Difficulty concentrating or making decisions? N N  Walking or climbing stairs? N Y   Comment bilateral knee issues. hx of sx shortness of breath since beginning cancer treatment  Dressing or bathing? N N  Doing errands, shopping? N -  Preparing Food and eating ? N -  Using the Toilet? N -  In the past six months, have you accidently leaked urine? Y -  Do you have problems with loss of bowel control? N -  Managing your Medications? N -  Managing your Finances? N -  Housekeeping or managing your Housekeeping? N -  Some recent data might be hidden    Fall/Depression Screening: Fall Risk  01/13/2018 06/30/2017 05/29/2017  Falls in the past year? No No No   PHQ 2/9 Scores 05/29/2017 05/07/2017 04/22/2017 01/20/2017 10/23/2016 07/03/2016 10/13/2014  PHQ - 2 Score 0 0 0 0 0 0 2    Assessment: Patient will continue to benefit from health coach outreach for disease management and support.   THN CM Care Plan Problem One     Most Recent Value  THN Long Term Goal   In 90 days the patient will have decreased her a1c by 2 points.  THN Long Term Goal Start Date  01/13/18  Interventions for Problem One Long Term Goal  discussed dietary intake,  reviewed and discussed medication management      Plan: RN Health Coach will contact patient in the month of August and patient agrees to next outreach.  Lazaro Arms RN, BSN, [place] Direct Dial:  [phone]  Fax: [phone]

## 2018-01-20 ENCOUNTER — Ambulatory Visit: Payer: Medicare HMO | Admitting: Clinical

## 2018-01-30 NOTE — Assessment & Plan Note (Signed)
I would like to try Trelegy as a maintenance controller, as discussed carefully with her. Plan-samples Trelegy for trial

## 2018-01-30 NOTE — Assessment & Plan Note (Signed)
Compliance and control are good and she continues to benefit from CPAP with better sleep.  She has discussed her concern about a scorched mark on her machine with her DME company but does not recognize a progressive or active process suggesting a fire hazard. Plan-continue CPAP auto 5-15.  Keep DME company advised of any service needs or concerns.

## 2018-02-17 ENCOUNTER — Other Ambulatory Visit: Payer: Self-pay

## 2018-02-17 NOTE — Patient Outreach (Signed)
[facility]) Care Management  02/17/2018  [name] [birth year] [id number]    1st outreach attempt to the patient for assessment. No answer. HIPAA compliant voicemail left with contact information.  Plan: RN Health Coach will send letter. RN Health Coach will make another attempt to the patient within four business days.  Lazaro Arms RN, BSN, [place] Direct Dial:  [phone]  Fax: [phone]

## 2018-02-19 ENCOUNTER — Other Ambulatory Visit: Payer: Self-pay | Admitting: Family Medicine

## 2018-02-23 ENCOUNTER — Ambulatory Visit: Payer: Self-pay

## 2018-02-24 ENCOUNTER — Telehealth: Payer: Self-pay | Admitting: Allergy and Immunology

## 2018-02-24 ENCOUNTER — Other Ambulatory Visit: Payer: Self-pay

## 2018-02-24 NOTE — Telephone Encounter (Signed)
ORIGINAL MESSAGE ENTERED IN ERROR PLEASE DISREGARD

## 2018-02-24 NOTE — Telephone Encounter (Signed)
Patient is calling requesting a refill for eye drops to be sent into [facility] in [place] Any questions - please call patient

## 2018-02-24 NOTE — Patient Outreach (Signed)
[facility]) Care Management  02/24/2018   [name] 1950-12-09 [id number]  Subjective: Telephone outreach to the patient for assessment. HIPAA verified.  The patient states that she is doing well.  She is back from spending time with her family in [place].  The patient had not checked her blood sugar yet today due to she had eaten.  She states that she will wait a couple of hours and then check.  The patient and I discussed checking blood sugars daily.  The patient verbalized understanding.  She reports that she has added more fruits and vegetables into her diet for weight loss and diabetes.  She is adherent with her medications.  She states that her breathing is better.  She is not sure if it is due to the cooler temperatures.  She denies any pain or falls.  She has an appointment set with Dr. Randel Pigg in October.    Current Medications:  Current Outpatient Medications  Medication Sig Dispense Refill  . acetaminophen (TYLENOL) 500 MG tablet Take 1,000 mg by mouth 2 (two) times daily as needed for moderate pain or headache.    . albuterol (PROVENTIL HFA;VENTOLIN HFA) 108 (90 Base) MCG/ACT inhaler Inhale 2 puffs into the lungs every 6 (six) hours as needed for wheezing or shortness of breath. 1 Inhaler 12  . anastrozole (ARIMIDEX) 1 MG tablet Take 1 tablet (1 mg total) by mouth daily. 90 tablet 3  . aspirin EC 81 MG tablet Take 81 mg by mouth at bedtime.     Marland Kitchen BYSTOLIC 10 MG tablet TAKE 1 TABLET (10 MG TOTAL) BY MOUTH AT BEDTIME. 90 tablet 2  . diclofenac (VOLTAREN) 75 MG EC tablet TAKE 1 TABLET TWICE DAILY 180 tablet 1  . diltiazem (CARDIZEM CD) 120 MG 24 hr capsule TAKE 1 CAPSULE (120 MG TOTAL) BY MOUTH DAILY. 90 capsule 2  . escitalopram (LEXAPRO) 20 MG tablet TAKE 1 TABLET (20 MG TOTAL) BY MOUTH DAILY. 90 tablet 1  . esomeprazole (NEXIUM) 40 MG capsule Take 1 capsule (40 mg total) by mouth daily. 90 capsule 2  . ezetimibe (ZETIA) 10 MG tablet Take 1 tablet (10 mg  total) by mouth daily. 90 tablet 3  . fluticasone (FLONASE) 50 MCG/ACT nasal spray Place 2 sprays into both nostrils daily. 16 g 6  . fluticasone furoate-vilanterol (BREO ELLIPTA) 100-25 MCG/INH AEPB Inhale 1 puff into the lungs daily. 1 each 11  . furosemide (LASIX) 20 MG tablet TAKE 1 TABLET (20 MG TOTAL) BY MOUTH DAILY. 90 tablet 2  . Hypromellose (ARTIFICIAL TEARS OP) Place 1 drop into both eyes daily as needed (dry eyes).     Marland Kitchen loratadine (CLARITIN) 10 MG tablet Take 1 tablet (10 mg total) by mouth daily. 30 tablet 11  . losartan (COZAAR) 100 MG tablet TAKE 1 TABLET EVERY DAY 90 tablet 2  . metFORMIN (GLUCOPHAGE) 500 MG tablet Take 1 tablet (500 mg total) by mouth 2 (two) times daily with a meal. 180 tablet 3  . montelukast (SINGULAIR) 10 MG tablet TAKE 1 TABLET (10 MG TOTAL) BY MOUTH AT BEDTIME. 90 tablet 2  . Probiotic Product (PROBIOTIC PO) Take 1 capsule by mouth at bedtime.    . ranitidine (ZANTAC) 150 MG tablet Take 1 tablet (150 mg total) by mouth at bedtime. 30 tablet 5  . rosuvastatin (CRESTOR) 40 MG tablet Take 1 tablet (40 mg total) by mouth daily. 90 tablet 3  . Fluticasone-Umeclidin-Vilant (TRELEGY ELLIPTA) 100-62.5-25 MCG/INH AEPB Inhale 1 puff into  the lungs daily. (Patient not taking: Reported on 02/24/2018) 2 each 0   No current facility-administered medications for this visit.     Functional Status:  In your present state of health, do you have any difficulty performing the following activities: 04/22/2017  Hearing? N  Vision? N  Comment wearing glasses. eye doctor yearly.   Difficulty concentrating or making decisions? N  Walking or climbing stairs? N  Comment bilateral knee issues. hx of sx  Dressing or bathing? N  Doing errands, shopping? N  Preparing Food and eating ? N  Using the Toilet? N  In the past six months, have you accidently leaked urine? Y  Do you have problems with loss of bowel control? N  Managing your Medications? N  Managing your Finances? N   Housekeeping or managing your Housekeeping? N  Some recent data might be hidden    Fall/Depression Screening: Fall Risk  02/24/2018 01/13/2018 06/30/2017  Falls in the past year? No No No   PHQ 2/9 Scores 05/29/2017 05/07/2017 04/22/2017 01/20/2017 10/23/2016 07/03/2016 10/13/2014  PHQ - 2 Score 0 0 0 0 0 0 2    Assessment: Patient will continue to benefit from health coach outreach for disease management and support.  THN CM Care Plan Problem One     Most Recent Value  THN Long Term Goal   In 90 days the patient will have decreased her a1c by 2 points.  THN Long Term Goal Start Date  02/24/18  Interventions for Problem One Long Term Goal  talked with the patient about her cbg readings and encouraged to check daily, food intake and medications.      Plan: RN Health Coach will contact patient in the month of November and patient agrees to next outreach.  Lazaro Arms RN, BSN, [place] Direct Dial:  [phone]  Fax: [phone]

## 2018-03-10 ENCOUNTER — Encounter (INDEPENDENT_AMBULATORY_CARE_PROVIDER_SITE_OTHER): Payer: Self-pay

## 2018-03-10 DIAGNOSIS — H2513 Age-related nuclear cataract, bilateral: Secondary | ICD-10-CM | POA: Diagnosis not present

## 2018-03-10 DIAGNOSIS — H43813 Vitreous degeneration, bilateral: Secondary | ICD-10-CM | POA: Diagnosis not present

## 2018-03-10 DIAGNOSIS — Z0100 Encounter for examination of eyes and vision without abnormal findings: Secondary | ICD-10-CM | POA: Diagnosis not present

## 2018-03-10 DIAGNOSIS — E119 Type 2 diabetes mellitus without complications: Secondary | ICD-10-CM | POA: Diagnosis not present

## 2018-03-10 LAB — HM DIABETES EYE EXAM

## 2018-03-13 ENCOUNTER — Telehealth: Payer: Self-pay | Admitting: Family Medicine

## 2018-03-13 NOTE — Telephone Encounter (Signed)
Follow up call made to patient. Advised her that [facility] has a Saturday clinic and she may be intested in going there tomorrow. Patient states she will call and schedule appointment. Patient given phone number there.

## 2018-03-13 NOTE — Telephone Encounter (Signed)
Copied from [place] [phone]. Topic: Quick Communication - See Telephone Encounter >> Mar 13, 2018  3:28 PM Blase Mess A wrote: CRM for notification. See Telephone encounter for: 03/13/18. Patient is calling to request some antibiotics there is a place above her tooth.  It is on her gum it is like swollen place and it is swollen and tender to the touch. She called her dentist and they are closed today.  Please advise Patients call back number is [phone]

## 2018-03-14 ENCOUNTER — Ambulatory Visit (INDEPENDENT_AMBULATORY_CARE_PROVIDER_SITE_OTHER): Payer: Medicare HMO | Admitting: Family Medicine

## 2018-03-14 ENCOUNTER — Encounter: Payer: Self-pay | Admitting: Family Medicine

## 2018-03-14 VITALS — BP 112/80 | HR 66 | Temp 98.4°F | Resp 16 | Wt 324.0 lb

## 2018-03-14 DIAGNOSIS — J0100 Acute maxillary sinusitis, unspecified: Secondary | ICD-10-CM | POA: Diagnosis not present

## 2018-03-14 DIAGNOSIS — L0201 Cutaneous abscess of face: Secondary | ICD-10-CM | POA: Diagnosis not present

## 2018-03-14 MED ORDER — AMOXICILLIN-POT CLAVULANATE 875-125 MG PO TABS: 1.0000 | ORAL_TABLET | Freq: Two times a day (BID) | ORAL | 0 refills | Status: DC

## 2018-03-14 NOTE — Patient Instructions (Signed)
Start Augmentin today, twice a day.  Use flonase nasal spray if able to tolerate.  I believe you have a mild sinus infection, but I can feel a small abscess or cyst where you are tender as well.  This will treat both.   Follow up with PCP in 1-2 weeks.

## 2018-03-14 NOTE — Progress Notes (Signed)
[name] , 31-Oct-1950, 67 y.o., female MRN: [id number] Patient Care Team    Relationship Specialty Notifications Start End  [name], MD PCP - General Family Medicine  04/03/12    Comment: Levell July, MD Consulting Physician General Surgery  05/20/16   Nicholas Lose, MD Consulting Physician Hematology and Oncology  05/20/16   Gery Pray, MD Consulting Physician Radiation Oncology  05/20/16   Gardenia Phlegm, NP Nurse Practitioner Hematology and Oncology  09/12/16   Lazaro Arms, [place] [facility]  Admissions 05/07/17     Chief Complaint  Patient presents with  . Facial Pain     Subjective: Pt presents for an OV with complaints of pea size mass that is tender between her mouth and nose of 1 week  duration.  Associated symptoms include headache, runny nose, sinus pressure, teeth sensitive. She denies fever, chills, nausea or cough.   Depression screen [facility] 2/9 05/29/2017 05/07/2017 04/22/2017 01/20/2017 10/23/2016  Decreased Interest 0 0 0 0 0  Down, Depressed, Hopeless 0 0 0 0 0  PHQ - 2 Score 0 0 0 0 0  Some recent data might be hidden    Allergies  Allergen Reactions  . No Known Allergies    Social History   Tobacco Use  . Smoking status: Never Smoker  . Smokeless tobacco: Never Used  Substance Use Topics  . Alcohol use: Yes    Comment: rarely   Past Medical History:  Diagnosis Date  . Anemia 04/05/2012  . Anxiety   . Anxiety state 09/11/2007   Qualifier: Diagnosis of  By: Scherrie Gerlach    . Atrial tachycardia, paroxysmal ([place])   . Back pain 10/02/2014  . Breast cancer ([place]) 02/17/2017  . Chicken pox as a child  . Chronic diastolic CHF (congestive heart failure), NYHA class 1 ([place])   . Complication of anesthesia    pt states feels different in her body after anesthesia when waking up and also experiences a smell of burnt plastic for approx a wk   . Dilated aortic root ([place])    26mm by echo 09/2016    . Dysuria 02/17/2017  . Elevated LFTs 04/05/2012  . GERD (gastroesophageal reflux disease)   . Goiter   . Heart murmur    hx of one at birth   . History of kidney stones   . History of radiation therapy 10/31/16-12/17/16   left breast 45 Gy in 25 fractions, left breast boost 16 Gy in 8 fractions  . Hx of colonic polyps   . Hyperlipidemia   . Hypertension   . Insomnia 10/08/2016  . Malignant neoplasm of upper-outer quadrant of left female breast ([place]) 05/14/2016   not with patient  . Measles as a child  . Mumps as a child  . OA (osteoarthritis) of knee 04/05/2012  . Obesity 07/11/2014  . PONV (postoperative nausea and vomiting)   . Preventative health care 09/05/2013  . PVC's (premature ventricular contractions) 05/02/2016  . Shortness of breath dyspnea    walking distances / climbing stairs  . Sleep apnea 04/05/2012  . Tinea corporis 02/23/2013  . Type 2 diabetes mellitus with hyperglycemia ([place]) 12/31/2013  . Vasomotor rhinitis 04/05/2012  . Ventral hernia    Past Surgical History:  Procedure Laterality Date  . BREAST LUMPECTOMY WITH RADIOACTIVE SEED AND SENTINEL LYMPH NODE BIOPSY Left 06/21/2016   Procedure: LEFT BREAST LUMPECTOMY WITH RADIOACTIVE SEED AND SENTINEL LYMPH NODE BIOPSY, INJECT BLUE DYE  LEFT BREAST;  Surgeon: Fanny Skates, MD;  Location: [place];  Service: General;  Laterality: Left;  . CARDIAC CATHETERIZATION     normal coroary arteries per patient  . CARDIOVASCULAR STRESS TEST     10/12/2013  . CESAREAN SECTION     X 3  . CHOLECYSTECTOMY    . COLONOSCOPY WITH PROPOFOL N/A 03/28/2015   Procedure: COLONOSCOPY WITH PROPOFOL;  Surgeon: Juanita Craver, MD;  Location: [facility];  Service: Endoscopy;  Laterality: N/A;  . HERNIA REPAIR  02/08/11   ventral hernia  . JOINT REPLACEMENT     bilateral  . KNEE ARTHROSCOPY  05/2010   bilateral  . LEFT AND RIGHT HEART CATHETERIZATION WITH CORONARY ANGIOGRAM N/A 11/08/2013   Procedure: LEFT AND RIGHT HEART CATHETERIZATION WITH  CORONARY ANGIOGRAM;  Surgeon: [name], MD;  Location: [facility] C[facility] LAB;  Service: Cardiovascular;  Laterality: N/A;  . MENISCUS REPAIR  2009  . MOUTH SURGERY     teeth implants  . PORT-A-CATH REMOVAL N/A 01/24/2017   Procedure: REMOVAL PORT-A-CATH;  Surgeon: Fanny Skates, MD;  Location: [place];  Service: General;  Laterality: N/A;  . PORTACATH PLACEMENT Right 07/16/2016   Procedure: INSERTION PORT-A-CATH RIGHT INTERNAL JUGULAR WITH ULTRASOUND;  Surgeon: Fanny Skates, MD;  Location: [place];  Service: General;  Laterality: Right;  . TOTAL KNEE ARTHROPLASTY  2011   left  . TOTAL KNEE ARTHROPLASTY Right 05/10/2014   Procedure: RIGHT TOTAL KNEE ARTHROPLASTY;  Surgeon: Mauri Pole, MD;  Location: [facility];  Service: Orthopedics;  Laterality: Right;  . TUBAL LIGATION    . WISDOM TOOTH EXTRACTION  2000   Family History  Problem Relation Age of Onset  . Thyroid disease Mother   . Hyperlipidemia Mother   . Heart attack Father 55  . Hypertension Father   . Arthritis Father        RA  . Coronary artery disease Father   . Coronary artery disease Brother   . Heart disease Brother   . Cancer Maternal Aunt        colon  . Cancer Maternal Grandmother        colon  . Heart attack Maternal Grandfather   . Alcohol abuse Paternal Grandfather    Allergies as of 03/14/2018      Reactions   No Known Allergies       Medication List        Accurate as of 03/14/18 10:45 AM. Always use your most recent med list.          acetaminophen 500 MG tablet Commonly known as:  TYLENOL Take 1,000 mg by mouth 2 (two) times daily as needed for moderate pain or headache.   albuterol 108 (90 Base) MCG/ACT inhaler Commonly known as:  PROVENTIL HFA;VENTOLIN HFA Inhale 2 puffs into the lungs every 6 (six) hours as needed for wheezing or shortness of breath.   anastrozole 1 MG tablet Commonly known as:  ARIMIDEX Take 1 tablet (1 mg total) by mouth daily.   ARTIFICIAL TEARS OP Place 1 drop into  both eyes daily as needed (dry eyes).   aspirin EC 81 MG tablet Take 81 mg by mouth at bedtime.   BYSTOLIC 10 MG tablet Generic drug:  nebivolol TAKE 1 TABLET (10 MG TOTAL) BY MOUTH AT BEDTIME.   diclofenac 75 MG EC tablet Commonly known as:  VOLTAREN TAKE 1 TABLET TWICE DAILY   diltiazem 120 MG 24 hr capsule Commonly known as:  CARDIZEM CD TAKE 1 CAPSULE (120  MG TOTAL) BY MOUTH DAILY.   escitalopram 20 MG tablet Commonly known as:  LEXAPRO TAKE 1 TABLET (20 MG TOTAL) BY MOUTH DAILY.   esomeprazole 40 MG capsule Commonly known as:  NEXIUM Take 1 capsule (40 mg total) by mouth daily.   ezetimibe 10 MG tablet Commonly known as:  ZETIA Take 1 tablet (10 mg total) by mouth daily.   fluticasone 50 MCG/ACT nasal spray Commonly known as:  FLONASE Place 2 sprays into both nostrils daily.   fluticasone furoate-vilanterol 100-25 MCG/INH Aepb Commonly known as:  BREO ELLIPTA Inhale 1 puff into the lungs daily.   Fluticasone-Umeclidin-Vilant 100-62.5-25 MCG/INH Aepb Inhale 1 puff into the lungs daily.   furosemide 20 MG tablet Commonly known as:  LASIX TAKE 1 TABLET (20 MG TOTAL) BY MOUTH DAILY.   loratadine 10 MG tablet Commonly known as:  CLARITIN Take 1 tablet (10 mg total) by mouth daily.   losartan 100 MG tablet Commonly known as:  COZAAR TAKE 1 TABLET EVERY DAY   metFORMIN 500 MG tablet Commonly known as:  GLUCOPHAGE Take 1 tablet (500 mg total) by mouth 2 (two) times daily with a meal.   montelukast 10 MG tablet Commonly known as:  SINGULAIR TAKE 1 TABLET (10 MG TOTAL) BY MOUTH AT BEDTIME.   PROBIOTIC PO Take 1 capsule by mouth at bedtime.   ranitidine 150 MG tablet Commonly known as:  ZANTAC Take 1 tablet (150 mg total) by mouth at bedtime.   rosuvastatin 40 MG tablet Commonly known as:  CRESTOR Take 1 tablet (40 mg total) by mouth daily.       All past medical history, surgical history, allergies, family history, immunizations andmedications were  updated in the EMR today and reviewed under the history and medication portions of their EMR.     ROS: Negative, with the exception of above mentioned in HPI   Objective:  BP 112/80   Pulse 66   Temp 98.4 F (36.9 C) (Oral)   Resp 16   Wt (!) 324 lb (147 kg)   SpO2 98%   BMI 53.92 kg/m  Body mass index is 53.92 kg/m. Gen: Afebrile. No acute distress. Nontoxic in appearance, well developed, well nourished.  HENT: AT. [place]. Bilateral TM visualized with fullness, no erythema. MMM, no oral lesions. Bilateral nares with erythema, swelling and drainage. Throat without erythema or exudates. PND, mild cough present. TTP max sinus. <10mm round TTP cystic-like mass located just medial to right nasolabial fold soft tissue. Mild swelling right lateral nare. Skin intact, no drainage. No Tenderness to teeth or gums. NO redness of gums.  Eyes:Pupils Equal Round Reactive to light, Extraocular movements intact,  Conjunctiva without redness, discharge or icterus. Neck/lymp/endocrine: Supple,no lymphadenopathy CV: RRR  Chest: CTAB, no wheeze or crackles. Good air movement, normal resp effort.  Neuro:  Normal gait. PERLA. EOMi. Alert. Oriented x3  No exam data present No results found. No results found for this or any previous visit (from the past 24 hour(s)).  Assessment/Plan: RYNLEE LISBON is a 67 y.o. female present for OV for  Acute non-recurrent maxillary sinusitis Rest, hydrate.  +flonase, mucinex (DM if cough), nettie pot or nasal saline.  Augmentin prescribed, take until completed.  If cough present it can last up to 6-8 weeks.  F/U 2 weeks of not improved.    Facial abscess Poss. Very small abscess <41mm vs epidermal cyst in the soft tissue near right nasal labial fold. No redness or drainage.  - abx prescribed for  sinusitis should help clear.  - f/u PCP in 1-2 weeks for recheck.    Reviewed expectations re: course of current medical issues.  Discussed self-management of  symptoms.  Outlined signs and symptoms indicating need for more acute intervention.  Patient verbalized understanding and all questions were answered.  Patient received an After-Visit Summary.    No orders of the defined types were placed in this encounter.    Note is dictated utilizing voice recognition software. Although note has been proof read prior to signing, occasional typographical errors still can be missed. If any questions arise, please do not hesitate to call for verification.   electronically signed by:  Howard Pouch, DO  [place]

## 2018-03-17 ENCOUNTER — Encounter (INDEPENDENT_AMBULATORY_CARE_PROVIDER_SITE_OTHER): Payer: Self-pay

## 2018-03-17 ENCOUNTER — Ambulatory Visit (INDEPENDENT_AMBULATORY_CARE_PROVIDER_SITE_OTHER): Payer: Self-pay | Admitting: Bariatrics

## 2018-03-17 DIAGNOSIS — Z0289 Encounter for other administrative examinations: Secondary | ICD-10-CM

## 2018-03-24 ENCOUNTER — Ambulatory Visit (INDEPENDENT_AMBULATORY_CARE_PROVIDER_SITE_OTHER): Payer: Medicare HMO | Admitting: Family Medicine

## 2018-03-24 ENCOUNTER — Encounter: Payer: Self-pay | Admitting: Family Medicine

## 2018-03-24 DIAGNOSIS — E6609 Other obesity due to excess calories: Secondary | ICD-10-CM | POA: Diagnosis not present

## 2018-03-24 DIAGNOSIS — K219 Gastro-esophageal reflux disease without esophagitis: Secondary | ICD-10-CM

## 2018-03-24 DIAGNOSIS — C50412 Malignant neoplasm of upper-outer quadrant of left female breast: Secondary | ICD-10-CM

## 2018-03-24 DIAGNOSIS — K047 Periapical abscess without sinus: Secondary | ICD-10-CM

## 2018-03-24 DIAGNOSIS — E1165 Type 2 diabetes mellitus with hyperglycemia: Secondary | ICD-10-CM

## 2018-03-24 DIAGNOSIS — Z23 Encounter for immunization: Secondary | ICD-10-CM | POA: Diagnosis not present

## 2018-03-24 DIAGNOSIS — I10 Essential (primary) hypertension: Secondary | ICD-10-CM

## 2018-03-24 DIAGNOSIS — E7800 Pure hypercholesterolemia, unspecified: Secondary | ICD-10-CM

## 2018-03-24 DIAGNOSIS — Z170 Estrogen receptor positive status [ER+]: Secondary | ICD-10-CM

## 2018-03-24 MED ORDER — FAMOTIDINE 20 MG PO TABS: 20.0000 mg | ORAL_TABLET | Freq: Two times a day (BID) | ORAL | 3 refills | Status: DC | PRN

## 2018-03-24 MED ORDER — CLINDAMYCIN HCL 300 MG PO CAPS: 300.0000 mg | ORAL_CAPSULE | Freq: Three times a day (TID) | ORAL | 0 refills | Status: DC

## 2018-03-24 MED FILL — CLINDAMYCIN HCL 300 MG CAP: 300 | 10 days supply | Qty: 30 | Fill #0

## 2018-03-24 MED FILL — FAMOTIDINE 20 MG TABLET: 20 | 30 days supply | Qty: 60 | Fill #0

## 2018-03-24 NOTE — Assessment & Plan Note (Signed)
Well controlled, no changes to meds. Encouraged heart healthy diet such as the DASH diet and exercise as tolerated.  °

## 2018-03-24 NOTE — Patient Instructions (Signed)
Shingrix is the new shingles shot 2 shots over 2-6 months at the pharmacy Cholesterol Cholesterol is a white, waxy, fat-like substance that is needed by the human body in small amounts. The liver makes all the cholesterol we need. Cholesterol is carried from the liver by the blood through the blood vessels. Deposits of cholesterol (plaques) may build up on blood vessel (artery) walls. Plaques make the arteries narrower and stiffer. Cholesterol plaques increase the risk for heart attack and stroke. You cannot feel your cholesterol level even if it is very high. The only way to know that it is high is to have a blood test. Once you know your cholesterol levels, you should keep a record of the test results. Work with your health care provider to keep your levels in the desired range. What do the results mean?  Total cholesterol is a rough measure of all the cholesterol in your blood.  LDL (low-density lipoprotein) is the "bad" cholesterol. This is the type that causes plaque to build up on the artery walls. You want this level to be low.  HDL (high-density lipoprotein) is the "good" cholesterol because it cleans the arteries and carries the LDL away. You want this level to be high.  Triglycerides are fat that the body can either burn for energy or store. High levels are closely linked to heart disease. What are the desired levels of cholesterol?  Total cholesterol below 200.  LDL below 100 for people who are at risk, below 70 for people at very high risk.  HDL above 40 is good. A level of 60 or higher is considered to be protective against heart disease.  Triglycerides below 150. How can I lower my cholesterol? Diet Follow your diet program as told by your health care provider.  Choose fish or white meat chicken and [place], roasted or baked. Limit fatty cuts of red meat, fried foods, and processed meats, such as sausage and lunch meats.  Eat lots of fresh fruits and vegetables.  Choose  whole grains, beans, pasta, potatoes, and cereals.  Choose olive oil, corn oil, or canola oil, and use only small amounts.  Avoid butter, mayonnaise, shortening, or palm kernel oils.  Avoid foods with trans fats.  Drink skim or nonfat milk and eat low-fat or nonfat yogurt and cheeses. Avoid whole milk, cream, ice cream, egg yolks, and full-fat cheeses.  Healthier desserts include angel food cake, ginger snaps, animal crackers, hard candy, popsicles, and low-fat or nonfat frozen yogurt. Avoid pastries, cakes, pies, and cookies.  Exercise  Follow your exercise program as told by your health care provider. A regular program: ? Helps to decrease LDL and raise HDL. ? Helps with weight control.  Do things that increase your activity level, such as gardening, walking, and taking the stairs.  Ask your health care provider about ways that you can be more active in your daily life.  Medicine  Take over-the-counter and prescription medicines only as told by your health care provider. ? Medicine may be prescribed by your health care provider to help lower cholesterol and decrease the risk for heart disease. This is usually done if diet and exercise have failed to bring down cholesterol levels. ? If you have several risk factors, you may need medicine even if your levels are normal.  This information is not intended to replace advice given to you by your health care provider. Make sure you discuss any questions you have with your health care provider. Document Released: 03/05/2001 Document Revised:  01/06/2016 Document Reviewed: 12/09/2015 Elsevier Interactive Patient Education  [employer].

## 2018-03-24 NOTE — Assessment & Plan Note (Signed)
[id number] mildly elevated minimize simple carbs. Increase exercise as tolerated. Continue current meds she will [facility]

## 2018-03-24 NOTE — Assessment & Plan Note (Addendum)
Encouraged DASH diet, decrease po intake and increase exercise as tolerated. Needs 7-8 hours of sleep nightly. Avoid trans fats, eat small, frequent meals every 4-5 hours with lean proteins, complex carbs and healthy fats. Minimize simple carbs. Starts with [facility] this week

## 2018-03-24 NOTE — Assessment & Plan Note (Signed)
Following with oncology and is having hot flashes with her med

## 2018-03-24 NOTE — Assessment & Plan Note (Signed)
Encouraged heart healthy diet, increase exercise, avoid trans fats, consider a krill oil cap daily 

## 2018-03-25 ENCOUNTER — Other Ambulatory Visit: Payer: Self-pay | Admitting: Internal Medicine

## 2018-03-25 ENCOUNTER — Other Ambulatory Visit: Payer: Self-pay | Admitting: Family Medicine

## 2018-03-26 ENCOUNTER — Ambulatory Visit (INDEPENDENT_AMBULATORY_CARE_PROVIDER_SITE_OTHER): Payer: Medicare HMO | Admitting: Bariatrics

## 2018-03-26 ENCOUNTER — Encounter (INDEPENDENT_AMBULATORY_CARE_PROVIDER_SITE_OTHER): Payer: Self-pay | Admitting: Bariatrics

## 2018-03-26 VITALS — BP 149/72 | HR 65 | Temp 98.0°F | Ht 64.0 in | Wt 323.0 lb

## 2018-03-26 DIAGNOSIS — Z6841 Body Mass Index (BMI) 40.0 and over, adult: Secondary | ICD-10-CM

## 2018-03-26 DIAGNOSIS — E538 Deficiency of other specified B group vitamins: Secondary | ICD-10-CM | POA: Diagnosis not present

## 2018-03-26 DIAGNOSIS — R5383 Other fatigue: Secondary | ICD-10-CM

## 2018-03-26 DIAGNOSIS — F3289 Other specified depressive episodes: Secondary | ICD-10-CM | POA: Diagnosis not present

## 2018-03-26 DIAGNOSIS — E119 Type 2 diabetes mellitus without complications: Secondary | ICD-10-CM | POA: Diagnosis not present

## 2018-03-26 DIAGNOSIS — R0602 Shortness of breath: Secondary | ICD-10-CM

## 2018-03-26 DIAGNOSIS — I10 Essential (primary) hypertension: Secondary | ICD-10-CM

## 2018-03-26 DIAGNOSIS — E559 Vitamin D deficiency, unspecified: Secondary | ICD-10-CM

## 2018-03-27 LAB — CBC WITH DIFFERENTIAL
BASOS: 0 %
Basophils Absolute: 0 10*3/uL (ref 0.0–0.2)
EOS (ABSOLUTE): 0 10*3/uL (ref 0.0–0.4)
EOS: 0 %
HEMATOCRIT: 40.3 % (ref 34.0–46.6)
HEMOGLOBIN: 12.4 g/dL (ref 11.1–15.9)
IMMATURE GRANULOCYTES: 0 %
Immature Grans (Abs): 0 10*3/uL (ref 0.0–0.1)
LYMPHS ABS: 1.5 10*3/uL (ref 0.7–3.1)
Lymphs: 16 %
MCH: 25.1 pg — AB (ref 26.6–33.0)
MCHC: 30.8 g/dL — AB (ref 31.5–35.7)
MCV: 81 fL (ref 79–97)
MONOCYTES: 6 %
MONOS ABS: 0.5 10*3/uL (ref 0.1–0.9)
NEUTROS ABS: 6.9 10*3/uL (ref 1.4–7.0)
Neutrophils: 78 %
RBC: 4.95 x10E6/uL (ref 3.77–5.28)
RDW: 17.1 % — AB (ref 12.3–15.4)
WBC: 9 10*3/uL (ref 3.4–10.8)

## 2018-03-27 LAB — LIPID PANEL WITH LDL/HDL RATIO
Cholesterol, Total: 126 mg/dL (ref 100–199)
HDL: 34 mg/dL — AB (ref >39)
LDL Calculated: 54 mg/dL (ref 0–99)
LDL/HDL RATIO: 1.6 ratio (ref 0.0–3.2)
Triglycerides: 188 mg/dL — ABNORMAL HIGH (ref 0–149)
VLDL CHOLESTEROL CAL: 38 mg/dL (ref 5–40)

## 2018-03-27 LAB — COMPREHENSIVE METABOLIC PANEL
ALBUMIN: 4 g/dL (ref 3.6–4.8)
ALT: 44 IU/L — AB (ref 0–32)
AST: 65 IU/L — ABNORMAL HIGH (ref 0–40)
Albumin/Globulin Ratio: 1.3 (ref 1.2–2.2)
Alkaline Phosphatase: 121 IU/L — ABNORMAL HIGH (ref 39–117)
BUN / CREAT RATIO: 15 (ref 12–28)
BUN: 14 mg/dL (ref 8–27)
Bilirubin Total: 0.4 mg/dL (ref 0.0–1.2)
CALCIUM: 9.9 mg/dL (ref 8.7–10.3)
CO2: 21 mmol/L (ref 20–29)
CREATININE: 0.94 mg/dL (ref 0.57–1.00)
Chloride: 98 mmol/L (ref 96–106)
GFR calc non Af Amer: 63 mL/min/{1.73_m2} (ref >59)
GFR, EST AFRICAN AMERICAN: 73 mL/min/{1.73_m2} (ref >59)
Globulin, Total: 3.1 g/dL (ref 1.5–4.5)
Glucose: 225 mg/dL — ABNORMAL HIGH (ref 65–99)
Potassium: 4.6 mmol/L (ref 3.5–5.2)
Sodium: 140 mmol/L (ref 134–144)
TOTAL PROTEIN: 7.1 g/dL (ref 6.0–8.5)

## 2018-03-27 LAB — FOLATE: Folate: 17.2 ng/mL (ref >3.0)

## 2018-03-27 LAB — HEMOGLOBIN A1C
Est. average glucose Bld gHb Est-mCnc: 226 mg/dL
Hgb A1c MFr Bld: 9.5 % — ABNORMAL HIGH (ref 4.8–5.6)

## 2018-03-27 LAB — INSULIN, RANDOM: INSULIN: 57 u[IU]/mL — AB (ref 2.6–24.9)

## 2018-03-27 LAB — TSH: TSH: 2.13 u[IU]/mL (ref 0.450–4.500)

## 2018-03-27 LAB — T3: T3 TOTAL: 137 ng/dL (ref 71–180)

## 2018-03-27 LAB — VITAMIN D 25 HYDROXY (VIT D DEFICIENCY, FRACTURES): Vit D, 25-Hydroxy: 9.1 ng/mL — ABNORMAL LOW (ref 30.0–100.0)

## 2018-03-27 LAB — T4, FREE: Free T4: 1.33 ng/dL (ref 0.82–1.77)

## 2018-03-27 LAB — VITAMIN B12: VITAMIN B 12: 357 pg/mL (ref 232–1245)

## 2018-03-29 DIAGNOSIS — K047 Periapical abscess without sinus: Secondary | ICD-10-CM | POA: Insufficient documentation

## 2018-03-29 NOTE — Progress Notes (Signed)
Subjective:    [name], female    DOB: 1950/09/10, 67 y.o.   MRN: [id number]  No chief complaint on file.   HPI Patient is in today for follow up. No recent hospitalizationsor febrile illness but she has been struggling with a dental infection. She has undergone antibiotics and is awaiting further procedures. Still has some pain. Acknowledges fatigue and malaise also. Denies CP/palp/SOB/HA/congestion/fevers/GI or GU c/o. Taking meds as prescribed. She is frustrated with her weight and is willing to try [facility].   Past Medical History:  Diagnosis Date  . Anemia 04/05/2012  . Anxiety   . Anxiety state 09/11/2007   Qualifier: Diagnosis of  By: Scherrie Gerlach    . Asthma   . Atrial tachycardia, paroxysmal ([place])   . Back pain 10/02/2014  . Breast cancer ([place]) 02/17/2017  . Chicken pox as a child  . Chronic diastolic CHF (congestive heart failure), NYHA class 1 ([place])   . Complication of anesthesia    pt states feels different in her body after anesthesia when waking up and also experiences a smell of burnt plastic for approx a wk   . Constipation   . Depression   . Diabetes ([place])   . Dilated aortic root ([place])    58m by echo 09/2016  . Dysuria 02/17/2017  . Elevated LFTs 04/05/2012  . GERD (gastroesophageal reflux disease)   . Goiter   . Heart murmur    hx of one at birth   . History of kidney stones   . History of radiation therapy 10/31/16-12/17/16   left breast 45 Gy in 25 fractions, left breast boost 16 Gy in 8 fractions  . Hx of colonic polyps   . Hyperlipidemia   . Hypertension   . Insomnia 10/08/2016  . Joint pain   . Malignant neoplasm of upper-outer quadrant of left female breast ([facility] 05/14/2016   not with patient  . Measles as a child  . Mumps as a child  . OA (osteoarthritis) of knee 04/05/2012  . Obesity 07/11/2014  . PONV (postoperative nausea and vomiting)   . Preventative health care 09/05/2013  . PVC's (premature  ventricular contractions) 05/02/2016  . Shortness of breath dyspnea    walking distances / climbing stairs  . Sleep apnea 04/05/2012  . Tinea corporis 02/23/2013  . Type 2 diabetes mellitus with hyperglycemia ([facility] 12/31/2013  . Vasomotor rhinitis 04/05/2012  . Ventral hernia   . Wheezing     Past Surgical History:  Procedure Laterality Date  . BREAST LUMPECTOMY WITH RADIOACTIVE SEED AND SENTINEL LYMPH NODE BIOPSY Left 06/21/2016   Procedure: LEFT BREAST LUMPECTOMY WITH RADIOACTIVE SEED AND SENTINEL LYMPH NODE BIOPSY, INJECT BLUE DYE LEFT BREAST;  Surgeon: HFanny Skates MD;  [facility]  Service: General;  Laterality: Left;  . CARDIAC CATHETERIZATION     normal coroary arteries per patient  . CARDIOVASCULAR STRESS TEST     10/12/2013  . CESAREAN SECTION     X 3  . CHOLECYSTECTOMY    . COLONOSCOPY WITH PROPOFOL N/A 03/28/2015   Procedure: COLONOSCOPY WITH PROPOFOL;  Surgeon: JJuanita Craver MD;  Location: [facility];  Service: Endoscopy;  Laterality: N/A;  . HERNIA REPAIR  02/08/11   ventral hernia  . JOINT REPLACEMENT     bilateral  . KNEE ARTHROSCOPY  05/2010   bilateral  . LEFT AND RIGHT HEART CATHETERIZATION WITH CORONARY ANGIOGRAM N/A 11/08/2013   Procedure: LEFT AND RIGHT HEART CATHETERIZATION WITH  CORONARY ANGIOGRAM;  Surgeon: [name], MD;  Location: [facility] CATH LAB;  Service: Cardiovascular;  Laterality: N/A;  . MENISCUS REPAIR  2009  . MOUTH SURGERY     teeth implants  . PORT-A-CATH REMOVAL N/A 01/24/2017   Procedure: REMOVAL PORT-A-CATH;  Surgeon: Fanny Skates, MD;  Location: [facility];  Service: General;  Laterality: N/A;  . PORTACATH PLACEMENT Right 07/16/2016   Procedure: INSERTION PORT-A-CATH RIGHT INTERNAL JUGULAR WITH ULTRASOUND;  Surgeon: Fanny Skates, MD;  Location: [place] [facility];  Service: General;  Laterality: Right;  . TOTAL KNEE ARTHROPLASTY  2011   left  . TOTAL KNEE ARTHROPLASTY Right 05/10/2014   Procedure: RIGHT TOTAL KNEE ARTHROPLASTY;  Surgeon:  Mauri Pole, MD;  Location: [facility];  Service: Orthopedics;  Laterality: Right;  . TUBAL LIGATION    . WISDOM TOOTH EXTRACTION  2000    Family History  Problem Relation Age of Onset  . Thyroid disease Mother   . Hyperlipidemia Mother   . Depression Mother   . Anxiety disorder Mother   . Heart attack Father 44  . Hypertension Father   . Arthritis Father        RA  . Coronary artery disease Father   . High Cholesterol Father   . Coronary artery disease Brother   . Heart disease Brother   . Cancer Maternal Aunt        colon  . Cancer Maternal Grandmother        colon  . Heart attack Maternal Grandfather   . Alcohol abuse Paternal Grandfather     Social History   Socioeconomic History  . Marital status: Divorced    Spouse name: Not on file  . Number of children: 3  . Years of education: Not on file  . Highest education level: Not on file  Occupational History  . Occupation: retired - [employer]  Social Needs  . Financial resource strain: Not on file  . Food insecurity:    Worry: Not on file    Inability: Not on file  . Transportation needs:    Medical: Not on file    Non-medical: Not on file  Tobacco Use  . Smoking status: Never Smoker  . Smokeless tobacco: Never Used  Substance and Sexual Activity  . Alcohol use: Yes    Comment: rarely  . Drug use: No  . Sexual activity: Never    Comment: lives with mother currently-stress  Lifestyle  . Physical activity:    Days per week: Not on file    Minutes per session: Not on file  . Stress: Not on file  Relationships  . Social connections:    Talks on phone: Not on file    Gets together: Not on file    Attends religious service: Not on file    Active member of club or organization: Not on file    Attends meetings of clubs or organizations: Not on file    Relationship status: Not on file  . Intimate partner violence:    Fear of current or ex partner: Not on file    Emotionally abused: Not on file     Physically abused: Not on file    Forced sexual activity: Not on file  Other Topics Concern  . Not on file  Social History Narrative  . Not on file    Outpatient Medications Prior to Visit  Medication Sig Dispense Refill  . acetaminophen (TYLENOL) 500 MG tablet Take 1,000 mg by mouth 2 (two) times  daily as needed for moderate pain or headache.    . albuterol (PROVENTIL HFA;VENTOLIN HFA) 108 (90 Base) MCG/ACT inhaler Inhale 2 puffs into the lungs every 6 (six) hours as needed for wheezing or shortness of breath. 1 Inhaler 12  . aspirin EC 81 MG tablet Take 81 mg by mouth at bedtime.     Marland Kitchen BYSTOLIC 10 MG tablet TAKE 1 TABLET (10 MG TOTAL) BY MOUTH AT BEDTIME. 90 tablet 2  . diclofenac (VOLTAREN) 75 MG EC tablet TAKE 1 TABLET TWICE DAILY 180 tablet 1  . diltiazem (CARDIZEM CD) 120 MG 24 hr capsule TAKE 1 CAPSULE (120 MG TOTAL) BY MOUTH DAILY. 90 capsule 2  . escitalopram (LEXAPRO) 20 MG tablet TAKE 1 TABLET (20 MG TOTAL) BY MOUTH DAILY. 90 tablet 1  . esomeprazole (NEXIUM) 40 MG capsule Take 1 capsule (40 mg total) by mouth daily. 90 capsule 2  . fluticasone (FLONASE) 50 MCG/ACT nasal spray Place 2 sprays into both nostrils daily. 16 g 6  . Fluticasone-Umeclidin-Vilant (TRELEGY ELLIPTA) 100-62.5-25 MCG/INH AEPB Inhale 1 puff into the lungs daily. 2 each 0  . furosemide (LASIX) 20 MG tablet TAKE 1 TABLET (20 MG TOTAL) BY MOUTH DAILY. 90 tablet 2  . Hypromellose (ARTIFICIAL TEARS OP) Place 1 drop into both eyes daily as needed (dry eyes).     Marland Kitchen loratadine (CLARITIN) 10 MG tablet Take 1 tablet (10 mg total) by mouth daily. 30 tablet 11  . losartan (COZAAR) 100 MG tablet TAKE 1 TABLET EVERY DAY 90 tablet 2  . metFORMIN (GLUCOPHAGE) 500 MG tablet Take 1 tablet (500 mg total) by mouth 2 (two) times daily with a meal. 180 tablet 3  . montelukast (SINGULAIR) 10 MG tablet TAKE 1 TABLET (10 MG TOTAL) BY MOUTH AT BEDTIME. 90 tablet 2  . Probiotic Product (PROBIOTIC PO) Take 1 capsule by mouth at bedtime.     Marland Kitchen amoxicillin-clavulanate (AUGMENTIN) 875-125 MG tablet Take 1 tablet by mouth 2 (two) times daily. 20 tablet 0  . anastrozole (ARIMIDEX) 1 MG tablet Take 1 tablet (1 mg total) by mouth daily. 90 tablet 3  . ezetimibe (ZETIA) 10 MG tablet Take 1 tablet (10 mg total) by mouth daily. 90 tablet 3  . fluticasone furoate-vilanterol (BREO ELLIPTA) 100-25 MCG/INH AEPB Inhale 1 puff into the lungs daily. 1 each 11  . ranitidine (ZANTAC) 150 MG tablet Take 1 tablet (150 mg total) by mouth at bedtime. 30 tablet 5  . rosuvastatin (CRESTOR) 40 MG tablet Take 1 tablet (40 mg total) by mouth daily. 90 tablet 3   No facility-administered medications prior to visit.     Allergies  Allergen Reactions  . No Known Allergies     Review of Systems  Constitutional: Positive for malaise/fatigue. Negative for fever.  HENT: Negative for congestion.   Eyes: Negative for blurred vision.  Respiratory: Negative for shortness of breath.   Cardiovascular: Negative for chest pain, palpitations and leg swelling.  Gastrointestinal: Negative for abdominal pain, blood in stool and nausea.  Genitourinary: Negative for dysuria and frequency.  Musculoskeletal: Positive for myalgias and neck pain. Negative for falls.  Skin: Negative for rash.  Neurological: Positive for headaches. Negative for dizziness and loss of consciousness.  Endo/Heme/Allergies: Negative for environmental allergies.  Psychiatric/Behavioral: Negative for depression. The patient is not nervous/anxious.        Objective:    Physical Exam  Constitutional: She is oriented to person, place, and time. She appears well-developed and well-nourished. No distress.  HENT:  Head: Normocephalic and atraumatic.  Nose: Nose normal.  Eyes: Right eye exhibits no discharge. Left eye exhibits no discharge.  Neck: Normal range of motion. Neck supple.  Cardiovascular: Normal rate and regular rhythm.  No murmur heard. Pulmonary/Chest: Effort normal and breath  sounds normal.  Abdominal: Soft. Bowel sounds are normal. There is no tenderness.  Musculoskeletal: She exhibits no edema.  Neurological: She is alert and oriented to person, place, and time.  Skin: Skin is warm and dry.  Psychiatric: She has a normal mood and affect.  Nursing note and vitals reviewed.   BP 124/82 (BP Location: Left Arm, Patient Position: Sitting, Cuff Size: Normal)   Pulse 77   Temp 98.2 F (36.8 C) (Oral)   Resp 18   Wt (!) 330 lb (149.7 kg)   SpO2 96%   BMI 54.91 kg/m  Wt Readings from Last 3 Encounters:  03/26/18 (!) 323 lb (146.5 kg)  03/24/18 (!) 330 lb (149.7 kg)  03/14/18 (!) 324 lb (147 kg)     Lab Results  Component Value Date   WBC 9.0 03/26/2018   HGB 12.4 03/26/2018   HCT 40.3 03/26/2018   PLT 282.0 12/22/2017   GLUCOSE 225 (H) 03/26/2018   CHOL 126 03/26/2018   TRIG 188 (H) 03/26/2018   HDL 34 (L) 03/26/2018   LDLDIRECT 108.0 10/08/2016   LDLCALC 54 03/26/2018   ALT 44 (H) 03/26/2018   AST 65 (H) 03/26/2018   NA 140 03/26/2018   K 4.6 03/26/2018   CL 98 03/26/2018   CREATININE 0.94 03/26/2018   BUN 14 03/26/2018   CO2 21 03/26/2018   TSH 2.130 03/26/2018   INR 1.01 05/03/2014   HGBA1C 9.5 (H) 03/26/2018    Lab Results  Component Value Date   TSH 2.130 03/26/2018   Lab Results  Component Value Date   WBC 9.0 03/26/2018   HGB 12.4 03/26/2018   HCT 40.3 03/26/2018   MCV 81 03/26/2018   PLT 282.0 12/22/2017   Lab Results  Component Value Date   NA 140 03/26/2018   K 4.6 03/26/2018   CHLORIDE 107 04/11/2017   CO2 21 03/26/2018   GLUCOSE 225 (H) 03/26/2018   BUN 14 03/26/2018   CREATININE 0.94 03/26/2018   BILITOT 0.4 03/26/2018   ALKPHOS 121 (H) 03/26/2018   AST 65 (H) 03/26/2018   ALT 44 (H) 03/26/2018   PROT 7.1 03/26/2018   ALBUMIN 4.0 03/26/2018   CALCIUM 9.9 03/26/2018   ANIONGAP 10 10/06/2017   EGFR >60 04/11/2017   GFR 79.45 12/22/2017   Lab Results  Component Value Date   CHOL 126 03/26/2018   Lab  Results  Component Value Date   HDL 34 (L) 03/26/2018   Lab Results  Component Value Date   LDLCALC 54 03/26/2018   Lab Results  Component Value Date   TRIG 188 (H) 03/26/2018   Lab Results  Component Value Date   CHOLHDL 3 12/22/2017   Lab Results  Component Value Date   HGBA1C 9.5 (H) 03/26/2018       Assessment & Plan:   Problem List Items Addressed This Visit    Hyperlipidemia    Encouraged heart healthy diet, increase exercise, avoid trans fats, consider a krill oil cap daily      Essential hypertension    Well controlled, no changes to meds. Encouraged heart healthy diet such as the DASH diet and exercise as tolerated.       GERD    Avoid offending  foods, start probiotics. Do not eat large meals in late evening and consider raising head of bed.       Relevant Medications   famotidine (PEPCID) 20 MG tablet   Type 2 diabetes mellitus with hyperglycemia (H[facility])    hgba1c mildly elevated minimize simple carbs. Increase exercise as tolerated. Continue current meds she will check her labs at [facility]      Obesity    Encouraged DASH diet, decrease po intake and increase exercise as tolerated. Needs 7-8 hours of sleep nightly. Avoid trans fats, eat small, frequent meals every 4-5 hours with lean proteins, complex carbs and healthy fats. Minimize simple carbs. Starts with [facility] this week      Malignant neoplasm of upper-outer quadrant of left female breast [facility])    Following with oncology and is having hot flashes with her med      Relevant Medications   clindamycin (CLEOCIN) 300 MG capsule   Dental infection    Has a failed tooth and is awaiting more work. Is given a prescription for clindamycin in case pain and symptoms worsen.       Relevant Medications   clindamycin (CLEOCIN) 300 MG capsule      I have discontinued Crestina A. Adger's ranitidine and amoxicillin-clavulanate. I am also having her start on  clindamycin and famotidine. Additionally, I am having her maintain her aspirin EC, Hypromellose (ARTIFICIAL TEARS OP), acetaminophen, Probiotic Product (PROBIOTIC PO), albuterol, metFORMIN, esomeprazole, diclofenac, escitalopram, loratadine, fluticasone, Fluticasone-Umeclidin-Vilant, BYSTOLIC, diltiazem, montelukast, losartan, and furosemide.  Meds ordered this encounter  Medications  . clindamycin (CLEOCIN) 300 MG capsule    Sig: Take 1 capsule (300 mg total) by mouth 3 (three) times daily.    Dispense:  30 capsule    Refill:  0  . famotidine (PEPCID) 20 MG tablet    Sig: Take 1 tablet (20 mg total) by mouth 2 (two) times daily as needed for heartburn or indigestion.    Dispense:  60 tablet    Refill:  3     Penni Homans, MD

## 2018-03-29 NOTE — Assessment & Plan Note (Signed)
Has a failed tooth and is awaiting more work. Is given a prescription for clindamycin in case pain and symptoms worsen.

## 2018-03-29 NOTE — Assessment & Plan Note (Signed)
Avoid offending foods, start probiotics. Do not eat large meals in late evening and consider raising head of bed.  

## 2018-03-30 ENCOUNTER — Encounter (INDEPENDENT_AMBULATORY_CARE_PROVIDER_SITE_OTHER): Payer: Self-pay | Admitting: Bariatrics

## 2018-03-30 DIAGNOSIS — E559 Vitamin D deficiency, unspecified: Secondary | ICD-10-CM | POA: Insufficient documentation

## 2018-03-31 NOTE — Progress Notes (Signed)
Office: [phone]  /  Fax: [phone]   Dear Dr. Charlett [name],   Thank you for referring [name] to our clinic. The following note includes my evaluation and treatment recommendations.  HPI:   Chief Complaint: OBESITY    [name] has been referred by [name] Levine A. [name] Blake, MD for consultation regarding her obesity and obesity related comorbidities.    [name] (MR# [id number]) is a 67 y.o. female who presents on 03/26/2018 for obesity evaluation and treatment. Current BMI is Body mass index is 55.44 kg/m.Marland Kitchen [name] has been struggling with her weight for many years and has been unsuccessful in either losing weight, maintaining weight loss, or reaching her healthy weight goal.     [name] states she struggles with cooking for one.     [name] attended our information session and states she is currently in the action stage of change and ready to dedicate time achieving and maintaining a healthier weight. [name] is interested in becoming our patient and working on intensive lifestyle modifications including (but not limited to) diet, exercise and weight loss.    [name] states her family eats meals together she struggles with family and or coworkers weight loss sabotage her desired weight loss is 123 lbs she has been heavy most of  her life she started gaining weight after each baby her heaviest weight ever was 323 lbs she has significant food cravings issues  she snacks frequently in the evenings she is frequently drinking liquids with calories she frequently makes poor food choices she has problems with excessive hunger  she frequently eats larger portions than normal  she struggles with emotional eating    [name] feels her energy is lower than it should be. This has worsened with weight gain and has not worsened recently. [name] admits to daytime somnolence and  admits to waking up still tired. Patient has a history of obstructive sleep apnea and wears  CPAP nightly. Patent has a history of symptoms of daytime [name] and morning headache. Patient generally gets 8 hours of sleep per night, and states they generally have generally restful sleep. Snoring is present. Apneic episodes are present. Epworth Sleepiness Score is 9.  Dyspnea on exertion [name] notes increasing shortness of breath with exercising and seems to be worsening over time with weight gain. She notes getting out of breath sooner with activity than she used to. This has not gotten worse recently. [name] denies orthopnea.  Hypertension [name] is a 67 y.o. female with hypertension. Tonita is taking Bystolic and diltiazem denies chest pain. She is working weight loss to help control her blood pressure with the goal of decreasing her risk of heart attack and stroke. Taffy's blood pressure is not currently controlled.  Diabetes II [name] has a diagnosis of diabetes type II. [name] states fasting BGs typically range in 120's, last A1c was 8.7 and she is on metformin. She denies any hypoglycemic episodes. She has been working on intensive lifestyle modifications including diet, exercise, and weight loss to help control her blood glucose levels.  Vitamin B12 Deficiency [name] has a diagnosis of B12 insufficiency and notes [name]. She is not a vegetarian and does not have a previous diagnosis of pernicious anemia. She does not have a history of weight loss surgery.   Vitamin D Deficiency [name] has a diagnosis of vitamin D deficiency. She is not currently taking Vit D and denies nausea, vomiting or muscle weakness.  Depression with emotional eating behaviors [name] is struggling with  emotional eating and using food for comfort to the extent that it is negatively impacting her health. She often snacks when she is not hungry. [name] sometimes feels she is out of control and then feels guilty that she made poor food choices. She has been working on behavior modification  techniques to help reduce her emotional eating and has been somewhat successful. She shows no sign of suicidal or homicidal ideations.  Depression screen [name] 2/9 03/26/2018 05/29/2017 05/07/2017 04/22/2017 01/20/2017  Decreased Interest 2 0 0 0 0  Down, Depressed, Hopeless 2 0 0 0 0  PHQ - 2 Score 4 0 0 0 0  Altered sleeping 3 - - - -  Tired, decreased energy 3 - - - -  Change in appetite 3 - - - -  Feeling bad or failure about yourself  3 - - - -  Trouble concentrating 1 - - - -  Moving slowly or fidgety/restless 0 - - - -  Suicidal thoughts 2 - - - -  PHQ-9 Score 19 - - - -  Difficult doing work/chores Very difficult - - - -  Some recent data might be hidden    Depression Screen [name]'s Food and Mood (modified PHQ-9) score was  Depression screen PHQ 2/9 03/26/2018  Decreased Interest 2  Down, Depressed, Hopeless 2  PHQ - 2 Score 4  Altered sleeping 3  Tired, decreased energy 3  Change in appetite 3  Feeling bad or failure about yourself  3  Trouble concentrating 1  Moving slowly or fidgety/restless 0  Suicidal thoughts 2  PHQ-9 Score 19  Difficult doing work/chores Very difficult  Some recent data might be hidden    ALLERGIES: Allergies  Allergen Reactions  . No Known Allergies     MEDICATIONS: Current Outpatient Medications on File Prior to Visit  Medication Sig Dispense Refill  . acetaminophen (TYLENOL) 500 MG tablet Take 1,000 mg by mouth 2 (two) times daily as needed for moderate pain or headache.    . albuterol (PROVENTIL HFA;VENTOLIN HFA) 108 (90 Base) MCG/ACT inhaler Inhale 2 puffs into the lungs every 6 (six) hours as needed for wheezing or shortness of breath. 1 Inhaler 12  . aspirin EC 81 MG tablet Take 81 mg by mouth at bedtime.     Marland Kitchen BREO ELLIPTA 100-25 MCG/INH AEPB INHALE 1 PUFF INTO LUNGS ONCE A DAY 60 each 0  . BYSTOLIC 10 MG tablet TAKE 1 TABLET (10 MG TOTAL) BY MOUTH AT BEDTIME. 90 tablet 2  . clindamycin (CLEOCIN) 300 MG capsule Take 1 capsule (300  mg total) by mouth 3 (three) times daily. 30 capsule 0  . diclofenac (VOLTAREN) 75 MG EC tablet TAKE 1 TABLET TWICE DAILY 180 tablet 1  . diltiazem (CARDIZEM CD) 120 MG 24 hr capsule TAKE 1 CAPSULE (120 MG TOTAL) BY MOUTH DAILY. 90 capsule 2  . escitalopram (LEXAPRO) 20 MG tablet TAKE 1 TABLET (20 MG TOTAL) BY MOUTH DAILY. 90 tablet 1  . esomeprazole (NEXIUM) 40 MG capsule Take 1 capsule (40 mg total) by mouth daily. 90 capsule 2  . famotidine (PEPCID) 20 MG tablet Take 1 tablet (20 mg total) by mouth 2 (two) times daily as needed for heartburn or indigestion. 60 tablet 3  . fluticasone (FLONASE) 50 MCG/ACT nasal spray Place 2 sprays into both nostrils daily. 16 g 6  . Fluticasone-Umeclidin-Vilant (TRELEGY ELLIPTA) 100-62.5-25 MCG/INH AEPB Inhale 1 puff into the lungs daily. 2 each 0  . furosemide (LASIX) 20 MG tablet TAKE  1 TABLET (20 MG TOTAL) BY MOUTH DAILY. 90 tablet 2  . Hypromellose (ARTIFICIAL TEARS OP) Place 1 drop into both eyes daily as needed (dry eyes).     Marland Kitchen loratadine (CLARITIN) 10 MG tablet Take 1 tablet (10 mg total) by mouth daily. 30 tablet 11  . losartan (COZAAR) 100 MG tablet TAKE 1 TABLET EVERY DAY 90 tablet 2  . metFORMIN (GLUCOPHAGE) 500 MG tablet Take 1 tablet (500 mg total) by mouth 2 (two) times daily with a meal. 180 tablet 3  . montelukast (SINGULAIR) 10 MG tablet TAKE 1 TABLET (10 MG TOTAL) BY MOUTH AT BEDTIME. 90 tablet 2  . Probiotic Product (PROBIOTIC PO) Take 1 capsule by mouth at bedtime.    Marland Kitchen anastrozole (ARIMIDEX) 1 MG tablet TAKE 1 TABLET (1 MG TOTAL) BY MOUTH DAILY. 90 tablet 3  . ezetimibe (ZETIA) 10 MG tablet TAKE 1 TABLET EVERY DAY 90 tablet 3  . rosuvastatin (CRESTOR) 40 MG tablet TAKE 1 TABLET EVERY DAY 90 tablet 3   No current facility-administered medications on file prior to visit.     PAST MEDICAL HISTORY: Past Medical History:  Diagnosis Date  . Anemia 04/05/2012  . Anxiety   . Anxiety state 09/11/2007   Qualifier: Diagnosis of  By: Scherrie Gerlach    . Asthma   . Atrial tachycardia, paroxysmal ([place])   . Back pain 10/02/2014  . Breast cancer ([place]) 02/17/2017  . Chicken pox as a child  . Chronic diastolic CHF (congestive heart failure), NYHA class 1 ([place])   . Complication of anesthesia    pt states feels different in her body after anesthesia when waking up and also experiences a smell of burnt plastic for approx a wk   . Constipation   . Depression   . Diabetes ([place])   . Dilated aortic root ([place])    27mm by echo 09/2016  . Dysuria 02/17/2017  . Elevated LFTs 04/05/2012  . GERD (gastroesophageal reflux disease)   . Goiter   . Heart murmur    hx of one at birth   . History of kidney stones   . History of radiation therapy 10/31/16-12/17/16   left breast 45 Gy in 25 fractions, left breast boost 16 Gy in 8 fractions  . Hx of colonic polyps   . Hyperlipidemia   . Hypertension   . Insomnia 10/08/2016  . Joint pain   . Malignant neoplasm of upper-outer quadrant of left female breast ([place]) 05/14/2016   not with patient  . Measles as a child  . Mumps as a child  . OA (osteoarthritis) of knee 04/05/2012  . Obesity 07/11/2014  . PONV (postoperative nausea and vomiting)   . Preventative health care 09/05/2013  . PVC's (premature ventricular contractions) 05/02/2016  . Shortness of breath dyspnea    walking distances / climbing stairs  . Sleep apnea 04/05/2012  . Tinea corporis 02/23/2013  . Type 2 diabetes mellitus with hyperglycemia ([place]) 12/31/2013  . Vasomotor rhinitis 04/05/2012  . Ventral hernia   . Wheezing     PAST SURGICAL HISTORY: Past Surgical History:  Procedure Laterality Date  . BREAST LUMPECTOMY WITH RADIOACTIVE SEED AND SENTINEL LYMPH NODE BIOPSY Left 06/21/2016   Procedure: LEFT BREAST LUMPECTOMY WITH RADIOACTIVE SEED AND SENTINEL LYMPH NODE BIOPSY, INJECT BLUE DYE LEFT BREAST;  Surgeon: Fanny Skates, MD;  Location: [facility];  Service: General;  Laterality: Left;  . CARDIAC CATHETERIZATION     normal coroary  arteries per patient  .  CARDIOVASCULAR STRESS TEST     10/12/2013  . CESAREAN SECTION     X 3  . CHOLECYSTECTOMY    . COLONOSCOPY WITH PROPOFOL N/A 03/28/2015   Procedure: COLONOSCOPY WITH PROPOFOL;  Surgeon: Juanita Craver, MD;  Location: [facility];  Service: Endoscopy;  Laterality: N/A;  . HERNIA REPAIR  02/08/11   ventral hernia  . JOINT REPLACEMENT     bilateral  . KNEE ARTHROSCOPY  05/2010   bilateral  . LEFT AND RIGHT HEART CATHETERIZATION WITH CORONARY ANGIOGRAM N/A 11/08/2013   Procedure: LEFT AND RIGHT HEART CATHETERIZATION WITH CORONARY ANGIOGRAM;  Surgeon: [name], MD;  Location: [facility] CATH LAB;  Service: Cardiovascular;  Laterality: N/A;  . MENISCUS REPAIR  2009  . MOUTH SURGERY     teeth implants  . PORT-A-CATH REMOVAL N/A 01/24/2017   Procedure: REMOVAL PORT-A-CATH;  Surgeon: Fanny Skates, MD;  Location: [facility];  Service: General;  Laterality: N/A;  . PORTACATH PLACEMENT Right 07/16/2016   Procedure: INSERTION PORT-A-CATH RIGHT INTERNAL JUGULAR WITH ULTRASOUND;  Surgeon: Fanny Skates, MD;  Location: [facility];  Service: General;  Laterality: Right;  . TOTAL KNEE ARTHROPLASTY  2011   left  . TOTAL KNEE ARTHROPLASTY Right 05/10/2014   Procedure: RIGHT TOTAL KNEE ARTHROPLASTY;  Surgeon: Mauri Pole, MD;  Location: [facility];  Service: Orthopedics;  Laterality: Right;  . TUBAL LIGATION    . WISDOM TOOTH EXTRACTION  2000    SOCIAL HISTORY: Social History   Tobacco Use  . Smoking status: Never Smoker  . Smokeless tobacco: Never Used  Substance Use Topics  . Alcohol use: Yes    Comment: rarely  . Drug use: No    FAMILY HISTORY: Family History  Problem Relation Age of Onset  . Thyroid disease Mother   . Hyperlipidemia Mother   . Depression Mother   . Anxiety disorder Mother   . Heart attack Father 65  . Hypertension Father   . Arthritis Father        RA  . Coronary artery disease Father   . High Cholesterol Father   . Coronary artery disease Brother    . Heart disease Brother   . Cancer Maternal Aunt        colon  . Cancer Maternal Grandmother        colon  . Heart attack Maternal Grandfather   . Alcohol abuse Paternal Grandfather     ROS: Review of Systems  Constitutional: Positive for malaise/[name]. Negative for weight loss.       + Breasts pain  HENT: Positive for ear pain and sinus pain.        + Decreased hearing + Stuffiness + Hay fever + Difficult or painful swallowing + Dry mouth + Hoarseness  Eyes:       + Wear glasses or contacts + Floaters  Respiratory: Positive for cough, shortness of breath (with exertion) and wheezing.        + Painful or difficulty breathing  Cardiovascular: Positive for palpitations. Negative for chest pain and orthopnea.       + Chest tightness + Calf/leg pain with walking + Leg cramping  Gastrointestinal: Positive for heartburn and nausea. Negative for vomiting.       + Swallowing difficulty  Musculoskeletal: Positive for back pain.       Negative muscle weakness + Neck stiffness + Muscle or joint pain + Muscle stiffness  Skin: Positive for itching and rash.       + Dryness  Neurological:  Positive for weakness and headaches.  Endo/Heme/Allergies: Positive for polydipsia. Bruises/bleeds easily.       Negative hypoglycemia + Polyphagia  Psychiatric/Behavioral: Positive for depression. Negative for suicidal ideas. The patient is nervous/anxious.        + Stress    PHYSICAL EXAM: Blood pressure (!) 149/72, pulse 65, temperature 98 F (36.7 C), temperature source Oral, height 5\' 4"  (1.626 m), weight (!) 323 lb (146.5 kg), SpO2 97 %. Body mass index is 55.44 kg/m. Physical Exam  Constitutional: She is oriented to person, place, and time. She appears well-developed and well-nourished.  HENT:  Head: Normocephalic and atraumatic.  Nose: Nose normal.  Eyes: EOM are normal. No scleral icterus.  Neck: Normal range of motion. Neck supple. No thyromegaly present.  + Mallampati= 3-4    Cardiovascular: Normal rate and regular rhythm.  Murmur (1/6 systolic murmur heard) heard. Pulmonary/Chest: Effort normal. No respiratory distress.  Abdominal: Soft. There is no tenderness.  + Obesity  Musculoskeletal:  Range of Motion normal in all 4 extremities Trace edema noted in bilateral lower extremities  Neurological: She is alert and oriented to person, place, and time. Coordination normal.  Skin: Skin is warm and dry.  Psychiatric: She has a normal mood and affect. Her behavior is normal.  Vitals reviewed.   RECENT LABS AND TESTS: BMET    Component Value Date/Time   NA 140 03/26/2018 1024   NA 145 04/11/2017 0942   K 4.6 03/26/2018 1024   K 4.4 04/11/2017 0942   CL 98 03/26/2018 1024   CO2 21 03/26/2018 1024   CO2 26 04/11/2017 0942   GLUCOSE 225 (H) 03/26/2018 1024   GLUCOSE 221 (H) 12/22/2017 0907   GLUCOSE 156 (H) 04/11/2017 0942   BUN 14 03/26/2018 1024   BUN 15.1 04/11/2017 0942   CREATININE 0.94 03/26/2018 1024   CREATININE 0.87 10/06/2017 1010   CREATININE 0.8 04/11/2017 0942   CALCIUM 9.9 03/26/2018 1024   CALCIUM 10.0 04/11/2017 0942   GFRNONAA 63 03/26/2018 1024   GFRNONAA >60 10/06/2017 1010   GFRAA 73 03/26/2018 1024   GFRAA >60 10/06/2017 1010   Lab Results  Component Value Date   HGBA1C 9.5 (H) 03/26/2018   Lab Results  Component Value Date   INSULIN 57.0 (H) 03/26/2018   CBC    Component Value Date/Time   WBC 9.0 03/26/2018 1024   WBC 7.5 12/22/2017 0907   RBC 4.95 03/26/2018 1024   RBC 4.87 12/22/2017 0907   HGB 12.4 03/26/2018 1024   HGB 12.2 04/11/2017 0942   HCT 40.3 03/26/2018 1024   HCT 39.1 04/11/2017 0942   PLT 282.0 12/22/2017 0907   PLT 274 10/06/2017 1010   PLT 240 04/11/2017 0942   MCV 81 03/26/2018 1024   MCV 81.6 04/11/2017 0942   MCH 25.1 (L) 03/26/2018 1024   MCH 25.6 10/06/2017 1010   MCHC 30.8 (L) 03/26/2018 1024   MCHC 31.1 12/22/2017 0907   RDW 17.1 (H) 03/26/2018 1024   RDW 19.2 (H) 04/11/2017 0942    LYMPHSABS 1.5 03/26/2018 1024   LYMPHSABS 1.4 04/11/2017 0942   MONOABS 0.5 12/22/2017 0907   MONOABS 0.3 04/11/2017 0942   EOSABS 0.0 03/26/2018 1024   BASOSABS 0.0 03/26/2018 1024   BASOSABS 0.0 04/11/2017 0942   Iron/TIBC/Ferritin/ %Sat No results found for: IRON, TIBC, FERRITIN, IRONPCTSAT Lipid Panel     Component Value Date/Time   CHOL 126 03/26/2018 1024   TRIG 188 (H) 03/26/2018 1024   HDL 34 (L)  03/26/2018 1024   CHOLHDL 3 12/22/2017 0907   VLDL 33.2 12/22/2017 0907   LDLCALC 54 03/26/2018 1024   LDLDIRECT 108.0 10/08/2016 0849   Hepatic Function Panel     Component Value Date/Time   PROT 7.1 03/26/2018 1024   PROT 7.5 04/11/2017 0942   ALBUMIN 4.0 03/26/2018 1024   ALBUMIN 3.6 04/11/2017 0942   AST 65 (H) 03/26/2018 1024   AST 33 10/06/2017 1010   AST 41 (H) 04/11/2017 0942   ALT 44 (H) 03/26/2018 1024   ALT 30 10/06/2017 1010   ALT 40 04/11/2017 0942   ALKPHOS 121 (H) 03/26/2018 1024   ALKPHOS 93 04/11/2017 0942   BILITOT 0.4 03/26/2018 1024   BILITOT 0.5 10/06/2017 1010   BILITOT 0.48 04/11/2017 0942   BILIDIR 0.15 06/19/2017 0950   IBILI 0.3 05/24/2016 0828      Component Value Date/Time   TSH 2.130 03/26/2018 1024   TSH 1.87 12/22/2017 0907   TSH 0.81 09/22/2017 0850    ECG  shows NSR with a rate of 67 BPM INDIRECT CALORIMETER done today shows a VO2 of 437 and a REE of 3040.  Her calculated basal metabolic rate is 3790 thus her basal metabolic rate is better than expected.    ASSESSMENT AND PLAN: Other [name] - Plan: EKG 12-Lead, Vitamin B12, CBC With Differential, Folate, Lipid Panel With LDL/HDL Ratio, T3, T4, free, TSH  Shortness of breath on exertion - Plan: CBC With Differential  Essential hypertension  Type 2 diabetes mellitus without complication, without long-term current use of insulin (HCC) - Plan: Comprehensive metabolic panel, Hemoglobin A1c, Insulin, random  Vitamin D deficiency - Plan: VITAMIN D 25 Hydroxy (Vit-D Deficiency,  Fractures)  B12 nutritional deficiency  Other depression - with emotional eating  Class 3 severe obesity with serious comorbidity and body mass index (BMI) of 50.0 to 59.9 in adult, unspecified obesity type (HCC)  PLAN:  [name] Marquisha was informed that her [name] may be related to obesity, depression or many other causes. Labs will be ordered, and in the meanwhile Fleeta has agreed to work on diet, exercise and weight loss to help with [name]. Proper sleep hygiene was discussed including the need for 7-8 hours of quality sleep each night. A sleep study was not ordered based on symptoms and Epworth score.  Dyspnea on exertion [name]'s shortness of breath appears to be obesity related and exercise induced. She has agreed to work on weight loss, continue CPAP, walk as tolerated, and gradually increase exercise to treat her exercise induced shortness of breath. If [name] follows our instructions and loses weight without improvement of her shortness of breath, we will plan to refer to pulmonology. We will monitor this condition regularly. Raneshia agrees to this plan.  Hypertension We discussed sodium restriction, working on healthy weight loss, and a regular exercise program as the means to achieve improved blood pressure control. [name] agreed with this plan and agreed to follow up as directed. We will continue to monitor her blood pressure as well as her progress with the above lifestyle modifications. Keara agrees to continue her medications for hypertension as prescribed and will watch for signs of hypotension as she continues her lifestyle modifications. She is to follow up with her Cardiologist. We will check labs and [name] agrees to follow up with our clinic in 2 weeks.  Diabetes II [name] has been given extensive diabetes education by myself today including ideal fasting and post-prandial blood glucose readings, individual ideal Hgb A1c goals and  hypoglycemia prevention. We  discussed the importance of good blood sugar control to decrease the likelihood of diabetic complications such as nephropathy, neuropathy, limb loss, blindness, coronary artery disease, and death. We discussed the importance of intensive lifestyle modification including diet, exercise and weight loss as the first line treatment for diabetes. [name] agrees to continue taking metformin as prescribed and we will check labs. [name] agrees to follow up with our clinic in 2 weeks.  Vitamin B12 Deficiency [name] will work on increasing B12 rich foods in her diet. B12 supplementation was not prescribed today. We will check labs and [name] agrees to follow up with our clinic in 2 weeks.  Vitamin D Deficiency [name] was informed that low vitamin D levels contributes to [name] and are associated with obesity, breast, and colon cancer. She will follow up for routine testing of vitamin D, at least 2-3 times per year. She was informed of the risk of over-replacement of vitamin D and agrees to not increase her dose unless she discusses this with [name] first. We will check labs and [name] agrees to follow up with our clinic in 2 weeks.  Depression with Emotional Eating Behaviors We discussed behavior modification techniques and cognitive behavioral strategies today to help [name] deal with her emotional eating and depression. We will refer to Dr. Mallie Mussel, our bariatric psychologist and Junia agrees to follow up with our clinic in 2 weeks.  Depression Screen [name] had a strongly positive depression screening. Depression is commonly associated with obesity and often results in emotional eating behaviors. We will monitor this closely and work on CBT to help improve the non-hunger eating patterns. Referral to Psychology may be required if no improvement is seen as she continues in our clinic.  Obesity Deshante is currently in the action stage of change and her goal is to continue with weight loss efforts. I  recommend Adline begin the structured treatment plan as follows:  She has agreed to follow the Category [place] has been instructed to eventually work up to a goal of 150 minutes of combined cardio and strengthening exercise per week for weight loss and overall health benefits. We discussed the following Behavioral Modification Strategies today: increasing lean protein intake, decreasing simple carbohydrates, increasing vegetables, work on meal planning and easy cooking plans, increase H20 intake, and no skipping meals   She was informed of the importance of frequent follow up visits to maximize her success with intensive lifestyle modifications for her multiple health conditions. She was informed we would discuss her lab results at her next visit unless there is a critical issue that needs to be addressed sooner. Belissa agreed to keep her next visit at the agreed upon time to discuss these results.    OBESITY BEHAVIORAL INTERVENTION VISIT  Today's visit was # 1   Starting weight: 323 lbs Starting date: 03/26/18 Today's weight : 323 lbs Today's date: 03/26/2018 Total lbs lost to date: 0 At least 15 minutes were spent on discussing the following behavioral intervention visit.   ASK: We discussed the diagnosis of obesity with [name] today and [name] agreed to give [name] permission to discuss obesity behavioral modification therapy today.  ASSESS: Mishti has the diagnosis of obesity and her BMI today is 55.42 Sanaiyah is in the action stage of change   ADVISE: Aubrianna was educated on the multiple health risks of obesity as well as the benefit of weight loss to improve her health. She was advised of the need for long term  treatment and the importance of lifestyle modifications to improve her current health and to decrease her risk of future health problems.  AGREE: Multiple dietary modification options and treatment options were discussed and  Fedora agreed to follow  the recommendations documented in the above note.  ARRANGE: Aluel was educated on the importance of frequent visits to treat obesity as outlined per CMS and USPSTF guidelines and agreed to schedule her next follow up appointment today.  Wilhemena Durie, am acting as transcriptionist for [employer], DO  I have reviewed the above documentation for accuracy and completeness, and I agree with the above. -Jearld Lesch, DO

## 2018-04-06 ENCOUNTER — Inpatient Hospital Stay: Payer: Medicare HMO | Attending: Hematology and Oncology | Admitting: Hematology and Oncology

## 2018-04-06 DIAGNOSIS — Z79899 Other long term (current) drug therapy: Secondary | ICD-10-CM

## 2018-04-06 DIAGNOSIS — Z853 Personal history of malignant neoplasm of breast: Secondary | ICD-10-CM | POA: Diagnosis not present

## 2018-04-06 DIAGNOSIS — Z170 Estrogen receptor positive status [ER+]: Secondary | ICD-10-CM | POA: Insufficient documentation

## 2018-04-06 DIAGNOSIS — Z7984 Long term (current) use of oral hypoglycemic drugs: Secondary | ICD-10-CM | POA: Diagnosis not present

## 2018-04-06 DIAGNOSIS — Z923 Personal history of irradiation: Secondary | ICD-10-CM | POA: Diagnosis not present

## 2018-04-06 DIAGNOSIS — Z9221 Personal history of antineoplastic chemotherapy: Secondary | ICD-10-CM

## 2018-04-06 DIAGNOSIS — C50412 Malignant neoplasm of upper-outer quadrant of left female breast: Secondary | ICD-10-CM | POA: Insufficient documentation

## 2018-04-06 DIAGNOSIS — Z7982 Long term (current) use of aspirin: Secondary | ICD-10-CM

## 2018-04-06 DIAGNOSIS — Z79811 Long term (current) use of aromatase inhibitors: Secondary | ICD-10-CM | POA: Diagnosis not present

## 2018-04-06 NOTE — Assessment & Plan Note (Signed)
06/21/16: Left lumpectomy: IDC grade 3, 1.1 cm, 1/5 LN Positive with ECE, Margins Neg, LVI Present; Er 30%, PR 0%, Ki 67 40%, Her 2 Positive Ratio 2.71; T1CN1 (Stage 2B)  Patient has multiple comorbidities including history of LVH with congestive heart failure: She has an echocardiogram coming up next month  Treatment Plan: 1. Adjuvant Taxol with Herceptin And Perjeta (Last echocardiogram showed an ejection fraction of 55-60%-- repeat on 4/3/2018EF 60-65%) completed 01/09/2017 2. Followed by adjuvant radiation 3. Followed by adjuvant antiestrogen therapy with anastrozole 1 mg daily 5 years (bone density 04/29/2016 T score -1.2) ----------------------------------------------------------------------------------------------------------------------------------------------------- Current Treatment:Anastrozole 1 mg daily started 04/29/2016 Anastrozole toxicities: Denies any hot flashes or myalgias  Surveillance:  Mammogram 05/12/2017: Benign  Return to clinic in 1 year for follow-up

## 2018-04-06 NOTE — Progress Notes (Signed)
Patient Care Team: [name], MD as PCP - General (Family Medicine) Fanny Skates, MD as Consulting Physician (General Surgery) Nicholas Lose, MD as Consulting Physician (Hematology and Oncology) Gery Pray, MD as Consulting Physician (Radiation Oncology) Delice Bison, Charlestine Massed, NP as Nurse Practitioner (Hematology and Oncology) Lazaro Arms, RN as [place] [facility]  DIAGNOSIS:  Encounter Diagnosis  Name Primary?  . Malignant neoplasm of upper-outer quadrant of left breast in female, estrogen receptor positive ([place])     SUMMARY OF ONCOLOGIC HISTORY:   Malignant neoplasm of upper-outer quadrant of left female breast ([place])   05/13/2016 Initial Diagnosis    Left breast biopsy 3:00: IDC, grade 3, ER 30%, PR 0%, Ki-67 40%, HER-2 positive ratio 2.71, screening detected left breast mass 9 x 7 x 6 mm, axilla negative, T1 BN 0 stage IA clinical stage    06/21/2016 Surgery    Left lumpectomy: IDC grade 3, 1.1 cm, 1/5 LN Positive with ECE, Margins Neg, LVI Present; Er 30%, PR 0%, Ki 67 40%, Her 2 Positive Ratio 2.71; T1CN1 (Stage 2B)    07/25/2016 - 01/09/2017 Chemotherapy    Taxol weekly 12; Herceptin and Perjeta every 3 weeks      10/31/2016 - 11/28/2016 Radiation Therapy    Adjuvant radiation therapy    12/2016 -  Anti-estrogen oral therapy    Anastrozole daily     CHIEF COMPLIANT: Follow-up on anastrozole therapy  INTERVAL HISTORY: [name] is a 67 year old with above-mentioned history of left breast cancer treated with lumpectomy adjuvant radiation is currently on anastrozole.  She is tolerating it fairly well with exception of decreased appetite.  She attributes this to anastrozole.  She does not have any significant hot flashes or myalgias.  REVIEW OF SYSTEMS:   Constitutional: Decreased appetite Eyes: Denies blurriness of vision Ears, nose, mouth, throat, and face: Denies mucositis or sore throat Respiratory: Denies cough, dyspnea or  wheezes Cardiovascular: Denies palpitation, chest discomfort Gastrointestinal:  Denies nausea, heartburn or change in bowel habits Skin: Denies abnormal skin rashes Lymphatics: Denies new lymphadenopathy or easy bruising Neurological:Denies numbness, tingling or new weaknesses Behavioral/Psych: Mood is stable, no new changes  Extremities: No lower extremity edema Breast:  denies any pain or lumps or nodules in either breasts All other systems were reviewed with the patient and are negative.  I have reviewed the past medical history, past surgical history, social history and family history with the patient and they are unchanged from previous note.  ALLERGIES:  is allergic to no known allergies.  MEDICATIONS:  Current Outpatient Medications  Medication Sig Dispense Refill  . acetaminophen (TYLENOL) 500 MG tablet Take 1,000 mg by mouth 2 (two) times daily as needed for moderate pain or headache.    . albuterol (PROVENTIL HFA;VENTOLIN HFA) 108 (90 Base) MCG/ACT inhaler Inhale 2 puffs into the lungs every 6 (six) hours as needed for wheezing or shortness of breath. 1 Inhaler 12  . anastrozole (ARIMIDEX) 1 MG tablet TAKE 1 TABLET (1 MG TOTAL) BY MOUTH DAILY. 90 tablet 3  . aspirin EC 81 MG tablet Take 81 mg by mouth at bedtime.     Marland Kitchen BREO ELLIPTA 100-25 MCG/INH AEPB INHALE 1 PUFF INTO LUNGS ONCE A DAY 60 each 0  . BYSTOLIC 10 MG tablet TAKE 1 TABLET (10 MG TOTAL) BY MOUTH AT BEDTIME. 90 tablet 2  . diclofenac (VOLTAREN) 75 MG EC tablet TAKE 1 TABLET TWICE DAILY 180 tablet 1  . diltiazem (CARDIZEM CD) 120 MG 24  hr capsule TAKE 1 CAPSULE (120 MG TOTAL) BY MOUTH DAILY. 90 capsule 2  . escitalopram (LEXAPRO) 20 MG tablet TAKE 1 TABLET (20 MG TOTAL) BY MOUTH DAILY. 90 tablet 1  . esomeprazole (NEXIUM) 40 MG capsule Take 1 capsule (40 mg total) by mouth daily. 90 capsule 2  . ezetimibe (ZETIA) 10 MG tablet TAKE 1 TABLET EVERY DAY 90 tablet 3  . famotidine (PEPCID) 20 MG tablet Take 1 tablet (20 mg  total) by mouth 2 (two) times daily as needed for heartburn or indigestion. 60 tablet 3  . fluticasone (FLONASE) 50 MCG/ACT nasal spray Place 2 sprays into both nostrils daily. 16 g 6  . Fluticasone-Umeclidin-Vilant (TRELEGY ELLIPTA) 100-62.5-25 MCG/INH AEPB Inhale 1 puff into the lungs daily. 2 each 0  . furosemide (LASIX) 20 MG tablet TAKE 1 TABLET (20 MG TOTAL) BY MOUTH DAILY. 90 tablet 2  . Hypromellose (ARTIFICIAL TEARS OP) Place 1 drop into both eyes daily as needed (dry eyes).     Marland Kitchen loratadine (CLARITIN) 10 MG tablet Take 1 tablet (10 mg total) by mouth daily. 30 tablet 11  . losartan (COZAAR) 100 MG tablet TAKE 1 TABLET EVERY DAY 90 tablet 2  . metFORMIN (GLUCOPHAGE) 500 MG tablet Take 1 tablet (500 mg total) by mouth 2 (two) times daily with a meal. 180 tablet 3  . montelukast (SINGULAIR) 10 MG tablet TAKE 1 TABLET (10 MG TOTAL) BY MOUTH AT BEDTIME. 90 tablet 2  . Probiotic Product (PROBIOTIC PO) Take 1 capsule by mouth at bedtime.    . rosuvastatin (CRESTOR) 40 MG tablet TAKE 1 TABLET EVERY DAY 90 tablet 3   No current facility-administered medications for this visit.     PHYSICAL EXAMINATION: ECOG PERFORMANCE STATUS: 1 - Symptomatic but completely ambulatory  Vitals:   04/06/18 1130  BP: (!) 130/91  Pulse: 69  Resp: 16  Temp: 98.4 F (36.9 C)  SpO2: 95%   Filed Weights   04/06/18 1130  Weight: (!) 317 lb 9.6 oz (144.1 kg)    GENERAL:alert, no distress and comfortable SKIN: skin color, texture, turgor are normal, no rashes or significant lesions EYES: normal, Conjunctiva are pink and non-injected, sclera clear OROPHARYNX:no exudate, no erythema and lips, buccal mucosa, and tongue normal  NECK: supple, thyroid normal size, non-tender, without nodularity LYMPH:  no palpable lymphadenopathy in the cervical, axillary or inguinal LUNGS: clear to auscultation and percussion with normal breathing effort HEART: regular rate & rhythm and no murmurs and no lower extremity  edema ABDOMEN:abdomen soft, non-tender and normal bowel sounds MUSCULOSKELETAL:no cyanosis of digits and no clubbing  NEURO: alert & oriented x 3 with fluent speech, no focal motor/sensory deficits EXTREMITIES: No lower extremity edema BREAST: No palpable masses or nodules in either right or left breasts. No palpable axillary supraclavicular or infraclavicular adenopathy no breast tenderness or nipple discharge. (exam performed in the presence of a chaperone)  LABORATORY DATA:  I have reviewed the data as listed CMP Latest Ref Rng & Units 03/26/2018 12/22/2017 10/06/2017  Glucose 65 - 99 mg/dL 225(H) 221(H) 211(H)  BUN 8 - 27 mg/dL _0 Creatinine 0.57 - 1.00 mg/dL 0.94 0.77 0.87  Sodium 134 - 144 mmol/L 140 142 140  Potassium 3.5 - 5.2 mmol/L 4.6 5.3(H) 4.6  Chloride 96 - 106 mmol/L 98 101 103  CO2 20 - 29 mmol/L _1 Calcium 8.7 - 10.3 mg/dL 9.9 10.1 10.0  Total Protein 6.0 - 8.5 g/dL 7.1 7.4 7.8  Total  Bilirubin 0.0 - 1.2 mg/dL 0.4 0.5 0.5  Alkaline Phos 39 - 117 IU/L 121(H) 107 104  AST 0 - 40 IU/L 65(H) 35 33  ALT 0 - 32 IU/L 44(H) 33 30    Lab Results  Component Value Date   WBC 9.0 03/26/2018   HGB 12.4 03/26/2018   HCT 40.3 03/26/2018   MCV 81 03/26/2018   PLT 282.0 12/22/2017   NEUTROABS 6.9 03/26/2018    ASSESSMENT & PLAN:  Malignant neoplasm of upper-outer quadrant of left female breast ([place]) 06/21/16: Left lumpectomy: IDC grade 3, 1.1 cm, 1/5 LN Positive with ECE, Margins Neg, LVI Present; Er 30%, PR 0%, Ki 67 40%, Her 2 Positive Ratio 2.71; T1CN1 (Stage 2B)  Patient has multiple comorbidities including history of LVH with congestive heart failure: She has an echocardiogram coming up next month  Treatment Plan: 1. Adjuvant Taxol with Herceptin And Perjeta (Last echocardiogram showed an ejection fraction of 55-60%-- repeat on 4/3/2018EF 60-65%) completed 01/09/2017 2. Followed by adjuvant radiation 3. Followed by adjuvant antiestrogen therapy with  anastrozole 1 mg daily 5 years (bone density 04/29/2016 T score -1.2) ----------------------------------------------------------------------------------------------------------------------------------------------------- Current Treatment:Anastrozole 1 mg daily started 04/29/2016 Anastrozole toxicities: Denies any hot flashes or myalgias  Surveillance:  Mammogram 05/12/2017: Benign  I discussed with the patient that if she attributes the decreased appetite to anastrozole, to discontinue anastrozole and see how she feels.  If her symptoms improve then we can switch her to letrozole.  Return to clinic in 1 year for follow-up    No orders of the defined types were placed in this encounter.  The patient has a good understanding of the overall plan. she agrees with it. she will call with any problems that may develop before the next visit here.   Harriette Ohara, MD 04/06/18

## 2018-04-07 ENCOUNTER — Telehealth: Payer: Self-pay | Admitting: Hematology and Oncology

## 2018-04-07 NOTE — Telephone Encounter (Signed)
Mailed pt calendar  °

## 2018-04-08 ENCOUNTER — Ambulatory Visit (INDEPENDENT_AMBULATORY_CARE_PROVIDER_SITE_OTHER): Payer: Medicare HMO | Admitting: Psychology

## 2018-04-08 ENCOUNTER — Ambulatory Visit (INDEPENDENT_AMBULATORY_CARE_PROVIDER_SITE_OTHER): Payer: Medicare HMO | Admitting: Bariatrics

## 2018-04-08 VITALS — BP 122/78 | HR 61 | Temp 97.8°F | Ht 64.0 in | Wt 316.0 lb

## 2018-04-08 DIAGNOSIS — R945 Abnormal results of liver function studies: Secondary | ICD-10-CM

## 2018-04-08 DIAGNOSIS — E559 Vitamin D deficiency, unspecified: Secondary | ICD-10-CM

## 2018-04-08 DIAGNOSIS — Z9189 Other specified personal risk factors, not elsewhere classified: Secondary | ICD-10-CM

## 2018-04-08 DIAGNOSIS — I10 Essential (primary) hypertension: Secondary | ICD-10-CM | POA: Diagnosis not present

## 2018-04-08 DIAGNOSIS — E1165 Type 2 diabetes mellitus with hyperglycemia: Secondary | ICD-10-CM

## 2018-04-08 DIAGNOSIS — Z6841 Body Mass Index (BMI) 40.0 and over, adult: Secondary | ICD-10-CM | POA: Diagnosis not present

## 2018-04-08 DIAGNOSIS — F330 Major depressive disorder, recurrent, mild: Secondary | ICD-10-CM | POA: Diagnosis not present

## 2018-04-08 DIAGNOSIS — R7989 Other specified abnormal findings of blood chemistry: Secondary | ICD-10-CM

## 2018-04-08 MED ORDER — VITAMIN D (ERGOCALCIFEROL) 1.25 MG (50000 UNIT) PO CAPS: 50000.0000 [IU] | ORAL_CAPSULE | ORAL | 0 refills | Status: DC
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
## 2018-04-09 ENCOUNTER — Ambulatory Visit (INDEPENDENT_AMBULATORY_CARE_PROVIDER_SITE_OTHER): Payer: Medicare HMO | Admitting: Psychology

## 2018-04-13 NOTE — Progress Notes (Signed)
Office: [phone]  /  Fax: [phone]   HPI:   Chief Complaint: OBESITY [name] is here to discuss her progress with her obesity treatment plan. She is on the Category 4 plan and is following her eating plan approximately 80 % of the time. She states she is walking 10 minutes 3 times per week. [name] is doing well with the plan and is having minimal hunger.  Her weight is (!) 316 lb (143.3 kg) today and has had a weight loss of 7 pounds over a period of 2 weeks since her last visit. She has lost 7 lbs since starting treatment with [name].  Elevated Liver Function Tests [name] has a new dx of elevated LTF's. Her AST was 65 and ALT is 44 on 03/26/18. Her BMI is over 40. She denies muscle aches and has not been told that she has a fatty liver in the past.   Vitamin D deficiency [name] has a diagnosis of vitamin D deficiency. She is not currently taking vit D.  At risk for osteopenia and osteoporosis [name] is at higher risk of osteopenia and osteoporosis due to vitamin D deficiency.   Diabetes II [name] has a diagnosis of diabetes type II. [name] states that her fasting BGs range between 261 and 117 and are coming down. Her 2 hour post prandial blood sugars are 130 to 160's. Last A1c was 9.5 on 03/26/18. She has been working on intensive lifestyle modifications including diet, exercise, and weight loss to help control her blood glucose levels.  Hypertension [name] is a 67 y.o. female with hypertension. [name] is taking Bystolic 10mg  , dilitazem 120mg , and losartan 100mg  and her blood pressure is well controlled. She is working on weight loss to help control her blood pressure with the goal of decreasing her risk of heart attack and stroke.   ALLERGIES: Allergies  Allergen Reactions  . No Known Allergies     MEDICATIONS: Current Outpatient Medications on File Prior to Visit  Medication Sig Dispense Refill  . acetaminophen (TYLENOL) 500 MG tablet Take 1,000 mg by  mouth 2 (two) times daily as needed for moderate pain or headache.    . albuterol (PROVENTIL HFA;VENTOLIN HFA) 108 (90 Base) MCG/ACT inhaler Inhale 2 puffs into the lungs every 6 (six) hours as needed for wheezing or shortness of breath. 1 Inhaler 12  . anastrozole (ARIMIDEX) 1 MG tablet TAKE 1 TABLET (1 MG TOTAL) BY MOUTH DAILY. 90 tablet 3  . aspirin EC 81 MG tablet Take 81 mg by mouth at bedtime.     [name] BREO ELLIPTA 100-25 MCG/INH AEPB INHALE 1 PUFF INTO LUNGS ONCE A DAY 60 each 0  . BYSTOLIC 10 MG tablet TAKE 1 TABLET (10 MG TOTAL) BY MOUTH AT BEDTIME. 90 tablet 2  . diclofenac (VOLTAREN) 75 MG EC tablet TAKE 1 TABLET TWICE DAILY 180 tablet 1  . diltiazem (CARDIZEM CD) 120 MG 24 hr capsule TAKE 1 CAPSULE (120 MG TOTAL) BY MOUTH DAILY. 90 capsule 2  . escitalopram (LEXAPRO) 20 MG tablet TAKE 1 TABLET (20 MG TOTAL) BY MOUTH DAILY. 90 tablet 1  . esomeprazole (NEXIUM) 40 MG capsule Take 1 capsule (40 mg total) by mouth daily. 90 capsule 2  . ezetimibe (ZETIA) 10 MG tablet TAKE 1 TABLET EVERY DAY 90 tablet 3  . famotidine (PEPCID) 20 MG tablet Take 1 tablet (20 mg total) by mouth 2 (two) times daily as needed for heartburn or indigestion. 60 tablet 3  . fluticasone (FLONASE) 50  MCG/ACT nasal spray Place 2 sprays into both nostrils daily. 16 g 6  . Fluticasone-Umeclidin-Vilant (TRELEGY ELLIPTA) 100-62.5-25 MCG/INH AEPB Inhale 1 puff into the lungs daily. 2 each 0  . furosemide (LASIX) 20 MG tablet TAKE 1 TABLET (20 MG TOTAL) BY MOUTH DAILY. 90 tablet 2  . Hypromellose (ARTIFICIAL TEARS OP) Place 1 drop into both eyes daily as needed (dry eyes).     [name] loratadine (CLARITIN) 10 MG tablet Take 1 tablet (10 mg total) by mouth daily. 30 tablet 11  . losartan (COZAAR) 100 MG tablet TAKE 1 TABLET EVERY DAY 90 tablet 2  . metFORMIN (GLUCOPHAGE) 500 MG tablet Take 1 tablet (500 mg total) by mouth 2 (two) times daily with a meal. 180 tablet 3  . montelukast (SINGULAIR) 10 MG tablet TAKE 1 TABLET (10 MG TOTAL)  BY MOUTH AT BEDTIME. 90 tablet 2  . Probiotic Product (PROBIOTIC PO) Take 1 capsule by mouth at bedtime.    . rosuvastatin (CRESTOR) 40 MG tablet TAKE 1 TABLET EVERY DAY 90 tablet 3   No current facility-administered medications on file prior to visit.     PAST MEDICAL HISTORY: Past Medical History:  Diagnosis Date  . Anemia 04/05/2012  . Anxiety   . Anxiety state 09/11/2007   Qualifier: Diagnosis of  By: [name]    . Asthma   . Atrial tachycardia, paroxysmal ([place])   . Back pain 10/02/2014  . Breast cancer ([place]) 02/17/2017  . Chicken pox as a child  . Chronic diastolic CHF (congestive heart failure), NYHA class 1 ([place])   . Complication of anesthesia    pt states feels different in her body after anesthesia when waking up and also experiences a smell of burnt plastic for approx a wk   . Constipation   . Depression   . Diabetes ([place])   . Dilated aortic root ([name])    22mm by echo 09/2016  . Dysuria 02/17/2017  . Elevated LFTs 04/05/2012  . GERD (gastroesophageal reflux disease)   . Goiter   . Heart murmur    hx of one at birth   . History of kidney stones   . History of radiation therapy 10/31/16-12/17/16   left breast 45 Gy in 25 fractions, left breast boost 16 Gy in 8 fractions  . Hx of colonic polyps   . Hyperlipidemia   . Hypertension   . Insomnia 10/08/2016  . Joint pain   . Malignant neoplasm of upper-outer quadrant of left female breast ([name]) 05/14/2016   not with patient  . Measles as a child  . Mumps as a child  . OA (osteoarthritis) of knee 04/05/2012  . Obesity 07/11/2014  . PONV (postoperative nausea and vomiting)   . Preventative health care 09/05/2013  . PVC's (premature ventricular contractions) 05/02/2016  . Shortness of breath dyspnea    walking distances / climbing stairs  . Sleep apnea 04/05/2012  . Tinea corporis 02/23/2013  . Type 2 diabetes mellitus with hyperglycemia ([name]) 12/31/2013  . Vasomotor rhinitis 04/05/2012  . Ventral hernia   . Wheezing      PAST SURGICAL HISTORY: Past Surgical History:  Procedure Laterality Date  . BREAST LUMPECTOMY WITH RADIOACTIVE SEED AND SENTINEL LYMPH NODE BIOPSY Left 06/21/2016   Procedure: LEFT BREAST LUMPECTOMY WITH RADIOACTIVE SEED AND SENTINEL LYMPH NODE BIOPSY, INJECT BLUE DYE LEFT BREAST;  Surgeon: Fanny Skates, MD;  Location: [facility];  Service: General;  Laterality: Left;  . CARDIAC CATHETERIZATION     normal coroary  arteries per patient  . CARDIOVASCULAR STRESS TEST     10/12/2013  . CESAREAN SECTION     X 3  . CHOLECYSTECTOMY    . COLONOSCOPY WITH PROPOFOL N/A 03/28/2015   Procedure: COLONOSCOPY WITH PROPOFOL;  Surgeon: Juanita Craver, MD;  Location: [facility];  Service: Endoscopy;  Laterality: N/A;  . HERNIA REPAIR  02/08/11   ventral hernia  . JOINT REPLACEMENT     bilateral  . KNEE ARTHROSCOPY  05/2010   bilateral  . LEFT AND RIGHT HEART CATHETERIZATION WITH CORONARY ANGIOGRAM N/A 11/08/2013   Procedure: LEFT AND RIGHT HEART CATHETERIZATION WITH CORONARY ANGIOGRAM;  Surgeon: [name], MD;  Location[facility];  Service: Cardiovascular;  Laterality: N/A;  . MENISCUS REPAIR  2009  . MOUTH SURGERY     teeth implants  . PORT-A-CATH REMOVAL N/A 01/24/2017   Procedure: REMOVAL PORT-A-CATH;  Surgeon: Fanny Skates, MD;  Location: [facility];  Service: General;  Laterality: N/A;  . PORTACATH PLACEMENT Right 07/16/2016   Procedure: INSERTION PORT-A-CATH RIGHT INTERNAL JUGULAR WITH ULTRASOUND;  Surgeon: Fanny Skates, MD;  Location: [facility];  Service: General;  Laterality: Right;  . TOTAL KNEE ARTHROPLASTY  2011   left  . TOTAL KNEE ARTHROPLASTY Right 05/10/2014   Procedure: RIGHT TOTAL KNEE ARTHROPLASTY;  Surgeon: Mauri Pole, MD;  Location: [facility];  Service: Orthopedics;  Laterality: Right;  . TUBAL LIGATION    . WISDOM TOOTH EXTRACTION  2000    SOCIAL HISTORY: Social History   Tobacco Use  . Smoking status: Never Smoker  . Smokeless tobacco: Never Used  Substance Use  Topics  . Alcohol use: Yes    Comment: rarely  . Drug use: No    FAMILY HISTORY: Family History  Problem Relation Age of Onset  . Thyroid disease Mother   . Hyperlipidemia Mother   . Depression Mother   . Anxiety disorder Mother   . Heart attack Father 105  . Hypertension Father   . Arthritis Father        RA  . Coronary artery disease Father   . High Cholesterol Father   . Coronary artery disease Brother   . Heart disease Brother   . Cancer Maternal Aunt        colon  . Cancer Maternal Grandmother        colon  . Heart attack Maternal Grandfather   . Alcohol abuse Paternal Grandfather     ROS: Review of Systems  Constitutional: Positive for weight loss.  Musculoskeletal:       Negative for muscle aches.    PHYSICAL EXAM: Blood pressure 122/78, pulse 61, temperature 97.8 F (36.6 C), temperature source Oral, height 5\' 4"  (1.626 m), weight (!) 316 lb (143.3 kg), SpO2 97 %. Body mass index is 54.24 kg/m. Physical Exam  Constitutional: She is oriented to person, place, and time. She appears well-developed and well-nourished.  Cardiovascular: Normal rate.  Pulmonary/Chest: Effort normal.  Musculoskeletal: Normal range of motion.  Neurological: She is oriented to person, place, and time.  Skin: Skin is warm and dry.  Psychiatric: She has a normal mood and affect. Her behavior is normal.  Vitals reviewed.   RECENT LABS AND TESTS: BMET    Component Value Date/Time   NA 140 03/26/2018 1024   NA 145 04/11/2017 0942   K 4.6 03/26/2018 1024   K 4.4 04/11/2017 0942   CL 98 03/26/2018 1024   CO2 21 03/26/2018 1024   CO2 26 04/11/2017 0942  GLUCOSE 225 (H) 03/26/2018 1024   GLUCOSE 221 (H) 12/22/2017 0907   GLUCOSE 156 (H) 04/11/2017 0942   BUN 14 03/26/2018 1024   BUN 15.1 04/11/2017 0942   CREATININE 0.94 03/26/2018 1024   CREATININE 0.87 10/06/2017 1010   CREATININE 0.8 04/11/2017 0942   CALCIUM 9.9 03/26/2018 1024   CALCIUM 10.0 04/11/2017 0942    GFRNONAA 63 03/26/2018 1024   GFRNONAA >60 10/06/2017 1010   GFRAA 73 03/26/2018 1024   GFRAA >60 10/06/2017 1010   Lab Results  Component Value Date   HGBA1C 9.5 (H) 03/26/2018   HGBA1C 8.7 (H) 12/22/2017   HGBA1C 7.5 (H) 09/22/2017   HGBA1C 7.2 (H) 05/22/2017   HGBA1C 7.3 (H) 02/17/2017   Lab Results  Component Value Date   INSULIN 57.0 (H) 03/26/2018   CBC    Component Value Date/Time   WBC 9.0 03/26/2018 1024   WBC 7.5 12/22/2017 0907   RBC 4.95 03/26/2018 1024   RBC 4.87 12/22/2017 0907   HGB 12.4 03/26/2018 1024   HGB 12.2 04/11/2017 0942   HCT 40.3 03/26/2018 1024   HCT 39.1 04/11/2017 0942   PLT 282.0 12/22/2017 0907   PLT 274 10/06/2017 1010   PLT 240 04/11/2017 0942   MCV 81 03/26/2018 1024   MCV 81.6 04/11/2017 0942   MCH 25.1 (L) 03/26/2018 1024   MCH 25.6 10/06/2017 1010   MCHC 30.8 (L) 03/26/2018 1024   MCHC 31.1 12/22/2017 0907   RDW 17.1 (H) 03/26/2018 1024   RDW 19.2 (H) 04/11/2017 0942   LYMPHSABS 1.5 03/26/2018 1024   LYMPHSABS 1.4 04/11/2017 0942   MONOABS 0.5 12/22/2017 0907   MONOABS 0.3 04/11/2017 0942   EOSABS 0.0 03/26/2018 1024   BASOSABS 0.0 03/26/2018 1024   BASOSABS 0.0 04/11/2017 0942   Iron/TIBC/Ferritin/ %Sat No results found for: IRON, TIBC, FERRITIN, IRONPCTSAT Lipid Panel     Component Value Date/Time   CHOL 126 03/26/2018 1024   TRIG 188 (H) 03/26/2018 1024   HDL 34 (L) 03/26/2018 1024   CHOLHDL 3 12/22/2017 0907   VLDL 33.2 12/22/2017 0907   LDLCALC 54 03/26/2018 1024   LDLDIRECT 108.0 10/08/2016 0849   Hepatic Function Panel     Component Value Date/Time   PROT 7.1 03/26/2018 1024   PROT 7.5 04/11/2017 0942   ALBUMIN 4.0 03/26/2018 1024   ALBUMIN 3.6 04/11/2017 0942   AST 65 (H) 03/26/2018 1024   AST 33 10/06/2017 1010   AST 41 (H) 04/11/2017 0942   ALT 44 (H) 03/26/2018 1024   ALT 30 10/06/2017 1010   ALT 40 04/11/2017 0942   ALKPHOS 121 (H) 03/26/2018 1024   ALKPHOS 93 04/11/2017 0942   BILITOT 0.4  03/26/2018 1024   BILITOT 0.5 10/06/2017 1010   BILITOT 0.48 04/11/2017 0942   BILIDIR 0.15 06/19/2017 0950   IBILI 0.3 05/24/2016 0828      Component Value Date/Time   TSH 2.130 03/26/2018 1024   TSH 1.87 12/22/2017 0907   TSH 0.81 09/22/2017 0850   Results for Rubens, Jayel A (MRN [id number]) as of 04/13/2018 17:07  Ref. Range 03/26/2018 10:24  Vitamin D, 25-Hydroxy Latest Ref Range: 30.0 - 100.0 ng/mL 9.1 (L)   ASSESSMENT AND PLAN: Elevated LFTs  Vitamin D deficiency - Plan: Vitamin D, Ergocalciferol, (DRISDOL) 50000 units CAPS capsule  Uncontrolled type 2 diabetes mellitus with hyperglycemia (HCC)  Essential hypertension  At risk for osteoporosis  Class 3 severe obesity with serious comorbidity and body mass index (BMI) of  50.0 to 59.9 in adult, unspecified obesity type [facility])  PLAN:  Elevated Liver Function Tests Kaydense agrees to continue to watch her liver function tests and to recheck blood work in 3 months.  Vitamin D Deficiency Fama was informed that low vitamin D levels contributes to fatigue and are associated with obesity, breast, and colon cancer. She agrees to begin to take prescription Vit D @50 ,000 IU, 1 capsule every week #4 with no refills and will follow up for routine testing of vitamin D, at least 2-3 times per year. She was informed of the risk of over-replacement of vitamin D and agrees to not increase her dose unless she discusses this with [name] first. Cecelia agrees to follow up in 2 weeks.  At risk for osteopenia and osteoporosis Sylvanna was given extended (15 minutes) osteoporosis prevention counseling today. Misao is at risk for osteopenia and osteoporosis due to her vitamin D deficiency. She was encouraged to take her vitamin D and follow her higher calcium diet and increase strengthening exercise to help strengthen her bones and decrease her risk of osteopenia and osteoporosis.  Diabetes II Lilee has been given extensive diabetes education  by myself today including ideal fasting and post-prandial blood glucose readings, individual ideal Hgb A1c goals, and hypoglycemia prevention. We discussed the importance of good blood sugar control to decrease the likelihood of diabetic complications such as nephropathy, neuropathy, limb loss, blindness, coronary artery disease, and death. We discussed the importance of intensive lifestyle modification including diet, exercise and weight loss as the first line treatment for diabetes. Keiarah agrees to continue her diabetes medications and checking fasting blood sugars and 2 hour post prandial sugar levels. She agrees to follow up at the agreed upon time.  Hypertension We discussed sodium restriction, working on healthy weight loss, and a regular exercise program as the means to achieve improved blood pressure control. We will continue to monitor her blood pressure as well as her progress with the above lifestyle modifications. She will continue her antihypertensive medications as prescribed and will watch for signs of hypotension as she continues her lifestyle modifications. Kahlani agreed with this plan and agreed to follow up as directed.  Obesity Cheyeanne is currently in the action stage of change. As such, her goal is to continue with weight loss efforts. She has agreed to continue with the Category 4 plan, to decrease carbohydrates, and to increase protein. Ralyn has been instructed to work up to a goal of 150 minutes of combined cardio and strengthening exercise per week for weight loss and overall health benefits. We discussed the following Behavioral Modification Strategies today: increasing lean protein intake, decreasing simple carbohydrates, increasing vegetables, increase H2O intake, no skipping meals, and work on meal planning and easy cooking plans  Drusilla has agreed to follow up with our clinic in 2 weeks. She was informed of the importance of frequent follow up visits to maximize her  success with intensive lifestyle modifications for her multiple health conditions.   OBESITY BEHAVIORAL INTERVENTION VISIT  Today's visit was # 2   Starting weight: 323 lbs Starting date: 03/26/18 Today's weight : Weight: (!) 316 lb (143.3 kg)  Today's date: 04/08/2018 Total lbs lost to date: 7 At least 15 minutes were spent on discussing the following behavioral interventions:   ASK: We discussed the diagnosis of obesity with [name] today and Daksha agreed to give [name] permission to discuss obesity behavioral modification therapy today.  ASSESS: Ertha has the diagnosis of obesity and her BMI  today is 54.21 Ariadne is in the action stage of change.   ADVISE: Emelynn was educated on the multiple health risks of obesity as well as the benefit of weight loss to improve her health. She was advised of the need for long term treatment and the importance of lifestyle modifications to improve her current health and to decrease her risk of future health problems.  AGREE: Multiple dietary modification options and treatment options were discussed and Lallie agreed to follow the recommendations documented in the above note.  ARRANGE: Kemaya was educated on the importance of frequent visits to treat obesity as outlined per CMS and USPSTF guidelines and agreed to schedule her next follow up appointment today.  I, Marcille Blanco, am acting as Location manager for [employer]. Owens Shark, DO  I have reviewed the above documentation for accuracy and completeness, and I agree with the above. -Jearld Lesch, DO

## 2018-04-14 DIAGNOSIS — K045 Chronic apical periodontitis: Secondary | ICD-10-CM | POA: Diagnosis not present

## 2018-04-14 DIAGNOSIS — E1165 Type 2 diabetes mellitus with hyperglycemia: Secondary | ICD-10-CM | POA: Insufficient documentation

## 2018-04-21 NOTE — Progress Notes (Signed)
Office: [phone]  /  Fax: [phone]   Date: April 22, 2018 Time Seen: 11:35am Duration: 35 minutes Provider: Glennie [name], Psy.D. Type of Session: Individual Therapy   HPI: [name] was referred by [name] due to depression with emotional eating behaviors. She was seen for an initial appointment by this provider on April 08, 2018. Per the note for the initial visit with [name] on March 26, 2018, "[name] struggling with emotional eating and using food for comfort to the extent that it is negatively impacting herhealth. Sheoften snacks when sheis not hungry. [name] and then feels guilty that shemade poor food choices. [name] been working on behavior modification techniques to help reduce heremotional eating and has been somewhat successful.Sheshows no sign of suicidal or homicidal ideations." [name]'s Food and Mood (modified PHQ-9) score was19. In addition, per the note for the initial visit with [name] on March 26, 2018, [name] has been heavy most of her life and started gaining weight after each baby. Her heaviest weight ever was 323 pounds. During her initial appointment with Dr. Owens [name], [name]: significant food cravings issues; snacking frequently in the evenings; frequently drinking liquids with calories; frequently making poor food choices; frequently eating larger portions than normal; and struggling with emotional eating. During the initial appointment with this provider, [name] [name], "I've been a little more lonely and I'd been caring for my mother." She indicated her mother passed away September 04, 2017. [name] added she lives alone, and uses food for comfort. She [name] a history of binge eating, and noted, "Eating until you're sick." [name] [name] a belief she eats to also "fill time." [name] [name], "I've always had emotional eating since I've been grown. I  didn't worry about my weight as a child." She explained when she visited her daughter and grandchildren in [place], she did not feel the loneliness and did not feel the need to eat. She stated a desire to visit them again. She denied a history of purging and engagement in other compensatory strategies. [name] denied ever being diagnosed with an eating disorder. Furthermore, [name] was asked to complete a questionnaire assessing various behaviors related to emotional eating. [name] endorsed the [name]: overeat when you are celebrating, experience food cravings on a regular basis, eat certain foods when you are anxious, stressed, depressed, or your feelings are hurt, use food to help you cope with emotional situations, find food is comforting to you, overeat when you are angry or upset, overeat when you are worried about something, overeat frequently when you are bored or lonely, not worry about what you eat when you are in a good mood, overeat when you are angry at someone just to show them they cannot [name] you, overeat when you are alone, but eat much less when you are with other people and eat as a reward.  Session Content: Session focused on the [name] treatment goal: decrease emotional eating. The session was initiated with the administration of the PHQ-9 and GAD-7, as well as a brief check-in. [name] shared she felt she did better on the meal plan the first two weeks as it was "new."  She indicated struggling with having to consume the same foods daily. Thus, this provider engaged [name] in problem solving to identify variations for protein, snacks and vegetables. [name] expressed willingness to change her snack options and vegetable options between now and the next appointment. Session then focused on physical versus emotional hunger. [name]  indicated a belief that all hunger experienced was emotional hunger. As such, this was explored further. Upon exploration, it was identified that she ate  only the foods noted on the meal plan and was eating even when she was not hungry to ensure she was consuming everything noted. Thus, this provider explained that if she continues to eat what is prescribed on the meal plan, it would not automatically constitute as emotional hunger. Session then focused on psychoeducation regarding triggers for emotional eating. [name] was provided a handout, and encouraged to utilize the handout between now and the next appointment to increase awareness of triggers and frequency. [name] agreed. She identified stress and socialization as being frequent triggers for emotional eating. As it relates to socialization, this provider discussed specific skills to utilize. For example, this provider recommended that when conversing during a meal, [name] should put her utensil down to avoid mindless eating. Overall, [name] was receptive to today's appointment as evidenced by her openness to sharing, responsiveness to feedback, and willingness to identify her triggers for emotional eating.  Mental Status Examination: [name] arrived early for the appointment; therefore, the appointment was initiated early. She presented as appropriately dressed and groomed. [name] appeared her stated age and demonstrated adequate orientation to time, place, person, and purpose of the appointment. She also demonstrated appropriate eye contact. No psychomotor abnormalities or behavioral peculiarities noted. Her mood was euthymic with congruent affect. Her thought processes were logical, linear, and goal-directed. No hallucinations, delusions, bizarre thinking or behavior [name] or observed. Judgment, insight, and impulse [name] appeared to be grossly intact. There was no evidence of paraphasias (i.e., errors in speech, gross mispronunciations, and word substitutions), repetition deficits, or disturbances in volume or prosody (i.e., rhythm and intonation). There was no evidence of attention or memory  impairments. [name] denied current suicidal and homicidal ideation, intent or plan.  Structured Assessment Results: The Patient Health Questionnaire-9 (PHQ-9) is a self-report measure that assesses symptoms and severity of depression over the course of the last two weeks. [name] obtained a score of four suggesting minimal depression. [name] finds the endorsed symptoms to be not difficult at all. Depression screen PHQ 2/9 04/22/2018  Decreased Interest 1  Down, Depressed, Hopeless 1  PHQ - 2 Score 2  Altered sleeping 0  Tired, decreased energy 1  Change in appetite 1  Feeling bad or failure about yourself  0  Trouble concentrating 0  Moving slowly or fidgety/restless 0  Suicidal thoughts 0  PHQ-9 Score 4  Difficult doing work/chores -  Some recent data might be hidden   The Generalized Anxiety Disorder-7 (GAD-7) is a brief self-report measure that assesses symptoms of anxiety over the course of the last two weeks. [name] obtained a score of three suggesting minimal anxiety. GAD 7 : Generalized Anxiety Score 04/22/2018  Nervous, Anxious, on Edge 0  [name]/stop worrying 1  Worry too much - different things 1  Trouble relaxing 1  Restless 0  Easily annoyed or irritable 0  Afraid - awful might happen 0  Total GAD 7 Score 3  Anxiety Difficulty Not difficult at all   Interventions: Troyce was administered the PHQ-9 and GAD-7 for symptom monitoring. Content from the last session was reviewed. Throughout today's session, empathic reflections and validation were provided. This provider engaged Matasha in problem solving to assist with changing foods on the prescribed meal plan. Psychoeducation regarding triggers for emotional eating was provided.  DSM-5 Diagnosis: 296.31 (F33.0) Major Depressive Disorder, Recurrent Episode, Mild, With Anxious Distress, Mild  Treatment Goal & Progress: Eraina was seen for an initial appointment with this provider on April 08, 2018 during which the  [name] treatment goal was established: decrease emotional eating. Nalaysia has demonstrated progress in her goal of decreasing emotional eating as evidenced by her increased awareness of physical versus emotional hunger, as well as her willingness to explore her triggers for emotional eating.   Plan: Konica continues to appear able and willing to participate as evidenced by engagement in reciprocal conversation, and asking questions for clarification as appropriate. The next appointment will be scheduled in two weeks. The next session will focus on reviewing triggers for emotional eating and the introduction of thought defusion.

## 2018-04-22 ENCOUNTER — Encounter (INDEPENDENT_AMBULATORY_CARE_PROVIDER_SITE_OTHER): Payer: Self-pay | Admitting: Bariatrics

## 2018-04-22 ENCOUNTER — Other Ambulatory Visit: Payer: Self-pay | Admitting: Family Medicine

## 2018-04-22 ENCOUNTER — Ambulatory Visit (INDEPENDENT_AMBULATORY_CARE_PROVIDER_SITE_OTHER): Payer: Medicare HMO | Admitting: Psychology

## 2018-04-22 ENCOUNTER — Ambulatory Visit (INDEPENDENT_AMBULATORY_CARE_PROVIDER_SITE_OTHER): Payer: Medicare HMO | Admitting: Bariatrics

## 2018-04-22 VITALS — BP 115/73 | HR 53 | Temp 98.1°F | Ht 64.0 in | Wt 312.0 lb

## 2018-04-22 DIAGNOSIS — E1165 Type 2 diabetes mellitus with hyperglycemia: Secondary | ICD-10-CM

## 2018-04-22 DIAGNOSIS — Z853 Personal history of malignant neoplasm of breast: Secondary | ICD-10-CM

## 2018-04-22 DIAGNOSIS — F330 Major depressive disorder, recurrent, mild: Secondary | ICD-10-CM

## 2018-04-22 DIAGNOSIS — Z9189 Other specified personal risk factors, not elsewhere classified: Secondary | ICD-10-CM

## 2018-04-22 DIAGNOSIS — E559 Vitamin D deficiency, unspecified: Secondary | ICD-10-CM

## 2018-04-22 DIAGNOSIS — Z6841 Body Mass Index (BMI) 40.0 and over, adult: Secondary | ICD-10-CM | POA: Diagnosis not present

## 2018-04-22 DIAGNOSIS — I10 Essential (primary) hypertension: Secondary | ICD-10-CM

## 2018-04-22 MED ORDER — SEMAGLUTIDE(0.25 OR 0.5MG/DOS) 2 MG/1.5ML ~~LOC~~ SOPN: 0.2500 mg | PEN_INJECTOR | SUBCUTANEOUS | 0 refills | Status: DC

## 2018-04-22 NOTE — Progress Notes (Signed)
Office: [phone]  /  Fax: [phone]   HPI:   Chief Complaint: OBESITY [name] is here to discuss her progress with her obesity treatment plan. She is on the  follow the Category 4 plan and is following her eating plan approximately 100 % of the time. She states she is exercising by walking for 10 minutes 3 times per week. [name] is currently struggling with occasional increased appetite and some cravings.  Her weight is (!) 312 lb (141.5 kg) today and has had a weight loss of 4 pounds over a period of 2 weeks since her last visit. She has lost 11 lbs since starting treatment with [name].  Vitamin D deficiency [name] has a diagnosis of vitamin D deficiency. She is currently taking vit D and denies nausea, vomiting or muscle weakness.  Ref. Range 03/26/2018 10:24  Vitamin D, 25-Hydroxy Latest Ref Range: 30.0 - 100.0 ng/mL 9.1 (L)   Diabetes II [name] has a diagnosis of diabetes type II. [name] states fasting BGs range between 130 and 170, this is improved as she has had none in the 200s. 2 hours postprandial BGs range between 130 and 160 and denies any hypoglycemic episodes. Last A1c was 9.5. She has been working on intensive lifestyle modifications including diet, exercise, and weight loss to help control her blood glucose levels. She is currently on Metformin.   Hypertension [name] is a 67 y.o. female with hypertension.  [name] denies chest pain, lightheadedness, or shortness of breath on exertion. She is working weight loss to help control her blood pressure with the goal of decreasing her risk of heart attack and stroke. [name] blood pressure is currently controlled. She is currently taking Bystolic, Cardizem, Lasix, and Losartan.   At risk for osteopenia and osteoporosis [name] is at higher risk of osteopenia and osteoporosis due to vitamin D deficiency.   ALLERGIES: Allergies  Allergen Reactions  . No Known Allergies     MEDICATIONS: Current  Outpatient Medications on File Prior to Visit  Medication Sig Dispense Refill  . acetaminophen (TYLENOL) 500 MG tablet Take 1,000 mg by mouth 2 (two) times daily as needed for moderate pain or headache.    . albuterol (PROVENTIL HFA;VENTOLIN HFA) 108 (90 Base) MCG/ACT inhaler Inhale 2 puffs into the lungs every 6 (six) hours as needed for wheezing or shortness of breath. 1 Inhaler 12  . anastrozole (ARIMIDEX) 1 MG tablet TAKE 1 TABLET (1 MG TOTAL) BY MOUTH DAILY. 90 tablet 3  . aspirin EC 81 MG tablet Take 81 mg by mouth at bedtime.     Marland Kitchen BREO ELLIPTA 100-25 MCG/INH AEPB INHALE 1 PUFF INTO LUNGS ONCE A DAY 60 each 0  . BYSTOLIC 10 MG tablet TAKE 1 TABLET (10 MG TOTAL) BY MOUTH AT BEDTIME. 90 tablet 2  . diclofenac (VOLTAREN) 75 MG EC tablet TAKE 1 TABLET TWICE DAILY 180 tablet 1  . diltiazem (CARDIZEM CD) 120 MG 24 hr capsule TAKE 1 CAPSULE (120 MG TOTAL) BY MOUTH DAILY. 90 capsule 2  . escitalopram (LEXAPRO) 20 MG tablet TAKE 1 TABLET (20 MG TOTAL) BY MOUTH DAILY. 90 tablet 1  . esomeprazole (NEXIUM) 40 MG capsule Take 1 capsule (40 mg total) by mouth daily. 90 capsule 2  . ezetimibe (ZETIA) 10 MG tablet TAKE 1 TABLET EVERY DAY 90 tablet 3  . famotidine (PEPCID) 20 MG tablet Take 1 tablet (20 mg total) by mouth 2 (two) times daily as needed for heartburn or indigestion. 60 tablet 3  .  fluticasone (FLONASE) 50 MCG/ACT nasal spray Place 2 sprays into both nostrils daily. 16 g 6  . Fluticasone-Umeclidin-Vilant (TRELEGY ELLIPTA) 100-62.5-25 MCG/INH AEPB Inhale 1 puff into the lungs daily. 2 each 0  . furosemide (LASIX) 20 MG tablet TAKE 1 TABLET (20 MG TOTAL) BY MOUTH DAILY. 90 tablet 2  . Hypromellose (ARTIFICIAL TEARS OP) Place 1 drop into both eyes daily as needed (dry eyes).     Marland Kitchen loratadine (CLARITIN) 10 MG tablet Take 1 tablet (10 mg total) by mouth daily. 30 tablet 11  . losartan (COZAAR) 100 MG tablet TAKE 1 TABLET EVERY DAY 90 tablet 2  . metFORMIN (GLUCOPHAGE) 500 MG tablet Take 1 tablet  (500 mg total) by mouth 2 (two) times daily with a meal. 180 tablet 3  . montelukast (SINGULAIR) 10 MG tablet TAKE 1 TABLET (10 MG TOTAL) BY MOUTH AT BEDTIME. 90 tablet 2  . Probiotic Product (PROBIOTIC PO) Take 1 capsule by mouth at bedtime.    . rosuvastatin (CRESTOR) 40 MG tablet TAKE 1 TABLET EVERY DAY 90 tablet 3  . Vitamin D, Ergocalciferol, (DRISDOL) 50000 units CAPS capsule Take 1 capsule (50,000 Units total) by mouth every 7 (seven) days. 4 capsule 0   No current facility-administered medications on file prior to visit.     PAST MEDICAL HISTORY: Past Medical History:  Diagnosis Date  . Anemia 04/05/2012  . Anxiety   . Anxiety state 09/11/2007   Qualifier: Diagnosis of  By: Scherrie Gerlach    . Asthma   . Atrial tachycardia, paroxysmal ([facility])   . Back pain 10/02/2014  . Breast cancer ([place]) 02/17/2017  . Chicken pox as a child  . Chronic diastolic CHF (congestive heart failure), NYHA class 1 ([place])   . Complication of anesthesia    pt states feels different in her body after anesthesia when waking up and also experiences a smell of burnt plastic for approx a wk   . Constipation   . Depression   . Diabetes ([place])   . Dilated aortic root ([place])    50mm by echo 09/2016  . Dysuria 02/17/2017  . Elevated LFTs 04/05/2012  . GERD (gastroesophageal reflux disease)   . Goiter   . Heart murmur    hx of one at birth   . History of kidney stones   . History of radiation therapy 10/31/16-12/17/16   left breast 45 Gy in 25 fractions, left breast boost 16 Gy in 8 fractions  . Hx of colonic polyps   . Hyperlipidemia   . Hypertension   . Insomnia 10/08/2016  . Joint pain   . Malignant neoplasm of upper-outer quadrant of left female breast ([place]) 05/14/2016   not with patient  . Measles as a child  . Mumps as a child  . OA (osteoarthritis) of knee 04/05/2012  . Obesity 07/11/2014  . PONV (postoperative nausea and vomiting)   . Preventative health care 09/05/2013  . PVC's (premature  ventricular contractions) 05/02/2016  . Shortness of breath dyspnea    walking distances / climbing stairs  . Sleep apnea 04/05/2012  . Tinea corporis 02/23/2013  . Type 2 diabetes mellitus with hyperglycemia ([place]) 12/31/2013  . Vasomotor rhinitis 04/05/2012  . Ventral hernia   . Wheezing     PAST SURGICAL HISTORY: Past Surgical History:  Procedure Laterality Date  . BREAST LUMPECTOMY WITH RADIOACTIVE SEED AND SENTINEL LYMPH NODE BIOPSY Left 06/21/2016   Procedure: LEFT BREAST LUMPECTOMY WITH RADIOACTIVE SEED AND SENTINEL LYMPH NODE BIOPSY, INJECT BLUE  DYE LEFT BREAST;  Surgeon: Fanny Skates, MD;  Location: [facility];  Service: General;  Laterality: Left;  . CARDIAC CATHETERIZATION     normal coroary arteries per patient  . CARDIOVASCULAR STRESS TEST     10/12/2013  . CESAREAN SECTION     X 3  . CHOLECYSTECTOMY    . COLONOSCOPY WITH PROPOFOL N/A 03/28/2015   Procedure: COLONOSCOPY WITH PROPOFOL;  Surgeon: Juanita Craver, MD;  Location: [facility];  Service: Endoscopy;  Laterality: N/A;  . HERNIA REPAIR  02/08/11   ventral hernia  . JOINT REPLACEMENT     bilateral  . KNEE ARTHROSCOPY  05/2010   bilateral  . LEFT AND RIGHT HEART CATHETERIZATION WITH CORONARY ANGIOGRAM N/A 11/08/2013   Procedure: LEFT AND RIGHT HEART CATHETERIZATION WITH CORONARY ANGIOGRAM;  Surgeon: [name], MD;  Location: [facility] CATH LAB;  Service: Cardiovascular;  Laterality: N/A;  . MENISCUS REPAIR  2009  . MOUTH SURGERY     teeth implants  . PORT-A-CATH REMOVAL N/A 01/24/2017   Procedure: REMOVAL PORT-A-CATH;  Surgeon: Fanny Skates, MD;  Location: [facility];  Service: General;  Laterality: N/A;  . PORTACATH PLACEMENT Right 07/16/2016   Procedure: INSERTION PORT-A-CATH RIGHT INTERNAL JUGULAR WITH ULTRASOUND;  Surgeon: Fanny Skates, MD;  Location: [facility];  Service: General;  Laterality: Right;  . TOTAL KNEE ARTHROPLASTY  2011   left  . TOTAL KNEE ARTHROPLASTY Right 05/10/2014   Procedure: RIGHT TOTAL KNEE  ARTHROPLASTY;  Surgeon: Mauri Pole, MD;  Location: [facility];  Service: Orthopedics;  Laterality: Right;  . TUBAL LIGATION    . WISDOM TOOTH EXTRACTION  2000    SOCIAL HISTORY: Social History   Tobacco Use  . Smoking status: Never Smoker  . Smokeless tobacco: Never Used  Substance Use Topics  . Alcohol use: Yes    Comment: rarely  . Drug use: No    FAMILY HISTORY: Family History  Problem Relation Age of Onset  . Thyroid disease Mother   . Hyperlipidemia Mother   . Depression Mother   . Anxiety disorder Mother   . Heart attack Father 51  . Hypertension Father   . Arthritis Father        RA  . Coronary artery disease Father   . High Cholesterol Father   . Coronary artery disease Brother   . Heart disease Brother   . Cancer Maternal Aunt        colon  . Cancer Maternal Grandmother        colon  . Heart attack Maternal Grandfather   . Alcohol abuse Paternal Grandfather     ROS: Review of Systems  Constitutional: Positive for malaise/fatigue.  Respiratory: Negative for shortness of breath.   Cardiovascular: Negative for chest pain.  Gastrointestinal: Negative for nausea and vomiting.  Musculoskeletal:       Negative for muscle weakness  Neurological:       Negative for lightheadedness  Endo/Heme/Allergies:       Negative for hypoglycemia    PHYSICAL EXAM: Blood pressure 115/73, pulse (!) 53, temperature 98.1 F (36.7 C), temperature source Oral, height 5\' 4"  (1.626 m), weight (!) 312 lb (141.5 kg), SpO2 94 %. Body mass index is 53.55 kg/m. Physical Exam  Constitutional: She is oriented to person, place, and time. She appears well-developed and well-nourished.  HENT:  Head: Normocephalic.  Neck: Normal range of motion.  Cardiovascular: Normal rate.  Pulmonary/Chest: Effort normal.  Musculoskeletal: Normal range of motion.  Neurological: She is alert and oriented  to person, place, and time.  Skin: Skin is warm and dry.  Psychiatric: She has a normal mood  and affect. Her behavior is normal.  Vitals reviewed.   RECENT LABS AND TESTS: BMET    Component Value Date/Time   NA 140 03/26/2018 1024   NA 145 04/11/2017 0942   K 4.6 03/26/2018 1024   K 4.4 04/11/2017 0942   CL 98 03/26/2018 1024   CO2 21 03/26/2018 1024   CO2 26 04/11/2017 0942   GLUCOSE 225 (H) 03/26/2018 1024   GLUCOSE 221 (H) 12/22/2017 0907   GLUCOSE 156 (H) 04/11/2017 0942   BUN 14 03/26/2018 1024   BUN 15.1 04/11/2017 0942   CREATININE 0.94 03/26/2018 1024   CREATININE 0.87 10/06/2017 1010   CREATININE 0.8 04/11/2017 0942   CALCIUM 9.9 03/26/2018 1024   CALCIUM 10.0 04/11/2017 0942   GFRNONAA 63 03/26/2018 1024   GFRNONAA >60 10/06/2017 1010   GFRAA 73 03/26/2018 1024   GFRAA >60 10/06/2017 1010   Lab Results  Component Value Date   HGBA1C 9.5 (H) 03/26/2018   HGBA1C 8.7 (H) 12/22/2017   HGBA1C 7.5 (H) 09/22/2017   HGBA1C 7.2 (H) 05/22/2017   HGBA1C 7.3 (H) 02/17/2017   Lab Results  Component Value Date   INSULIN 57.0 (H) 03/26/2018   CBC    Component Value Date/Time   WBC 9.0 03/26/2018 1024   WBC 7.5 12/22/2017 0907   RBC 4.95 03/26/2018 1024   RBC 4.87 12/22/2017 0907   HGB 12.4 03/26/2018 1024   HGB 12.2 04/11/2017 0942   HCT 40.3 03/26/2018 1024   HCT 39.1 04/11/2017 0942   PLT 282.0 12/22/2017 0907   PLT 274 10/06/2017 1010   PLT 240 04/11/2017 0942   MCV 81 03/26/2018 1024   MCV 81.6 04/11/2017 0942   MCH 25.1 (L) 03/26/2018 1024   MCH 25.6 10/06/2017 1010   MCHC 30.8 (L) 03/26/2018 1024   MCHC 31.1 12/22/2017 0907   RDW 17.1 (H) 03/26/2018 1024   RDW 19.2 (H) 04/11/2017 0942   LYMPHSABS 1.5 03/26/2018 1024   LYMPHSABS 1.4 04/11/2017 0942   MONOABS 0.5 12/22/2017 0907   MONOABS 0.3 04/11/2017 0942   EOSABS 0.0 03/26/2018 1024   BASOSABS 0.0 03/26/2018 1024   BASOSABS 0.0 04/11/2017 0942   Iron/TIBC/Ferritin/ %Sat No results found for: IRON, TIBC, FERRITIN, IRONPCTSAT Lipid Panel     Component Value Date/Time   CHOL 126  03/26/2018 1024   TRIG 188 (H) 03/26/2018 1024   HDL 34 (L) 03/26/2018 1024   CHOLHDL 3 12/22/2017 0907   VLDL 33.2 12/22/2017 0907   LDLCALC 54 03/26/2018 1024   LDLDIRECT 108.0 10/08/2016 0849   Hepatic Function Panel     Component Value Date/Time   PROT 7.1 03/26/2018 1024   PROT 7.5 04/11/2017 0942   ALBUMIN 4.0 03/26/2018 1024   ALBUMIN 3.6 04/11/2017 0942   AST 65 (H) 03/26/2018 1024   AST 33 10/06/2017 1010   AST 41 (H) 04/11/2017 0942   ALT 44 (H) 03/26/2018 1024   ALT 30 10/06/2017 1010   ALT 40 04/11/2017 0942   ALKPHOS 121 (H) 03/26/2018 1024   ALKPHOS 93 04/11/2017 0942   BILITOT 0.4 03/26/2018 1024   BILITOT 0.5 10/06/2017 1010   BILITOT 0.48 04/11/2017 0942   BILIDIR 0.15 06/19/2017 0950   IBILI 0.3 05/24/2016 0828      Component Value Date/Time   TSH 2.130 03/26/2018 1024   TSH 1.87 12/22/2017 0907   TSH 0.81  09/22/2017 0850    Ref. Range 03/26/2018 10:24  Vitamin D, 25-Hydroxy Latest Ref Range: 30.0 - 100.0 ng/mL 9.1 (L)    ASSESSMENT AND PLAN: Type 2 diabetes mellitus without complication, without long-term current use of insulin (HCC) - Plan: Semaglutide,0.25 or 0.5MG /DOS, (OZEMPIC, 0.25 OR 0.5 MG/DOSE,) 2 MG/1.5ML SOPN  Essential hypertension  Vitamin D deficiency  At risk for osteoporosis  Class 3 severe obesity with serious comorbidity and body mass index (BMI) of 50.0 to 59.9 in adult, unspecified obesity type ([place])  PLAN: Vitamin D Deficiency [name] was informed that low vitamin D levels contributes to fatigue and are associated with obesity, breast, and colon cancer. She agrees to continue to take prescription Vit D @50 ,000 IU every week and will follow up for routine testing of vitamin D, at least 2-3 times per year. She was informed of the risk of over-replacement of vitamin D and agrees to not increase her dose unless she discusses this with [name] first. Agrees to follow up with our clinic as directed.   Diabetes II [name] has been given  extensive diabetes education by myself today including ideal fasting and post-prandial blood glucose readings, individual ideal HgA1c goals  and hypoglycemia prevention. We discussed the importance of good blood sugar control to decrease the likelihood of diabetic complications such as nephropathy, neuropathy, limb loss, blindness, coronary artery disease, and death. We discussed the importance of intensive lifestyle modification including diet, exercise and weight loss as the first line treatment for diabetes. [name] agrees to start Ozempic 0.25 mg weekly #4 single use pens with no refills, we will increase in 4 weeks and continue Metformin as prescribed. She will follow up at the agreed upon time.  Hypertension We discussed sodium restriction, working on healthy weight loss, and a regular exercise program as the means to achieve improved blood pressure control. [name] agreed with this plan and agreed to follow up as directed. We will continue to monitor her blood pressure as well as her progress with the above lifestyle modifications. She will continue her medications as prescribed and will watch for signs of hypotension as she continues her lifestyle modifications.  At risk for osteopenia and osteoporosis [name] was given extended  (15 minutes) osteoporosis prevention counseling today. [name] is at risk for osteopenia and osteoporosis due to her vitamin D deficiency. She was encouraged to take her vitamin D and follow her higher calcium diet and increase strengthening exercise to help strengthen her bones and decrease her risk of osteopenia and osteoporosis.  Obesity [name] is currently in the action stage of change. As such, her goal is to continue with weight loss efforts She has agreed to follow the Category [name] has been instructed to work up to a goal of 150 minutes of combined cardio and strengthening exercise per week for weight loss and overall health benefits. We discussed  the following Behavioral Modification Strategies today: increasing lean protein intake, decreasing simple carbohydrates , increasing vegetables, increasing water intake, no skipping meals, and work on meal planning and easy cooking plans.    [name] has agreed to follow up with our clinic in 2 weeks. She was informed of the importance of frequent follow up visits to maximize her success with intensive lifestyle modifications for her multiple health conditions.   OBESITY BEHAVIORAL INTERVENTION VISIT  Today's visit was # 3   Starting weight: 323 lb Starting date: 03/26/18 Today's weight : 312 lb Today's date: 04/22/2018 Total lbs lost to date: 11 lb  ASK: We discussed the diagnosis of obesity with [name] today and [place] agreed to give [name] permission to discuss obesity behavioral modification therapy today.  ASSESS: [name] has the diagnosis of obesity and her BMI today is 53.53 [name] is in the action stage of change   ADVISE: [name] was educated on the multiple health risks of obesity as well as the benefit of weight loss to improve her health. She was advised of the need for long term treatment and the importance of lifestyle modifications to improve her current health and to decrease her risk of future health problems.  AGREE: Multiple dietary modification options and treatment options were discussed and  [name] agreed to follow the recommendations documented in the above note.  ARRANGE: [name] was educated on the importance of frequent visits to treat obesity as outlined per CMS and USPSTF guidelines and agreed to schedule her next follow up appointment today.  Leary Roca, am acting as transcriptionist for [employer], DO  I have reviewed the above documentation for accuracy and completeness, and I agree with the above. -Jearld Lesch, DO

## 2018-04-23 ENCOUNTER — Ambulatory Visit: Payer: Medicare HMO | Admitting: *Deleted

## 2018-04-27 ENCOUNTER — Other Ambulatory Visit: Payer: Self-pay

## 2018-04-27 NOTE — Patient Outreach (Signed)
[facility]) Care Management  04/27/2018   [name] 12/18/1950 [id number]  Subjective: Successful telephone call to the patient for telephone assessment.  HIPAA verified The patient states that she is doing fine.  She denies any pain and falls. She states that her blood sugars have been doing better.  This morning her blood sugars was 124.  She had a visit with Dr Owens Shark on 10/30 and her a1c went from 8.7 to 9.5.  She has started going to [facility] on 10/[facility].  She has lost 11 lbs since.  She will see them every two weeks.  She is working on her diet to lose weight and lower her a1c.  She has also started a new medication ozempic.  She will take that once a week.  The patient is exercising 10 minutes three time week.  She is taking her medications as prescribed. The patient's next appointment with Dr. Owens Shark is 11/14.   Current Medications:  Current Outpatient Medications  Medication Sig Dispense Refill  . acetaminophen (TYLENOL) 500 MG tablet Take 1,000 mg by mouth 2 (two) times daily as needed for moderate pain or headache.    . albuterol (PROVENTIL HFA;VENTOLIN HFA) 108 (90 Base) MCG/ACT inhaler Inhale 2 puffs into the lungs every 6 (six) hours as needed for wheezing or shortness of breath. 1 Inhaler 12  . anastrozole (ARIMIDEX) 1 MG tablet TAKE 1 TABLET (1 MG TOTAL) BY MOUTH DAILY. 90 tablet 3  . aspirin EC 81 MG tablet Take 81 mg by mouth at bedtime.     Marland Kitchen BREO ELLIPTA 100-25 MCG/INH AEPB INHALE 1 PUFF INTO LUNGS ONCE A DAY 60 each 0  . BYSTOLIC 10 MG tablet TAKE 1 TABLET (10 MG TOTAL) BY MOUTH AT BEDTIME. 90 tablet 2  . diclofenac (VOLTAREN) 75 MG EC tablet TAKE 1 TABLET TWICE DAILY 180 tablet 1  . diltiazem (CARDIZEM CD) 120 MG 24 hr capsule TAKE 1 CAPSULE (120 MG TOTAL) BY MOUTH DAILY. 90 capsule 2  . escitalopram (LEXAPRO) 20 MG tablet TAKE 1 TABLET (20 MG TOTAL) BY MOUTH DAILY. 90 tablet 1  . esomeprazole (NEXIUM) 40 MG capsule Take 1 capsule  (40 mg total) by mouth daily. 90 capsule 2  . ezetimibe (ZETIA) 10 MG tablet TAKE 1 TABLET EVERY DAY 90 tablet 3  . famotidine (PEPCID) 20 MG tablet Take 1 tablet (20 mg total) by mouth 2 (two) times daily as needed for heartburn or indigestion. 60 tablet 3  . fluticasone (FLONASE) 50 MCG/ACT nasal spray Place 2 sprays into both nostrils daily. 16 g 6  . furosemide (LASIX) 20 MG tablet TAKE 1 TABLET (20 MG TOTAL) BY MOUTH DAILY. 90 tablet 2  . Hypromellose (ARTIFICIAL TEARS OP) Place 1 drop into both eyes daily as needed (dry eyes).     Marland Kitchen loratadine (CLARITIN) 10 MG tablet Take 1 tablet (10 mg total) by mouth daily. 30 tablet 11  . losartan (COZAAR) 100 MG tablet TAKE 1 TABLET EVERY DAY 90 tablet 2  . metFORMIN (GLUCOPHAGE) 500 MG tablet Take 1 tablet (500 mg total) by mouth 2 (two) times daily with a meal. 180 tablet 3  . montelukast (SINGULAIR) 10 MG tablet TAKE 1 TABLET (10 MG TOTAL) BY MOUTH AT BEDTIME. 90 tablet 2  . Probiotic Product (PROBIOTIC PO) Take 1 capsule by mouth at bedtime.    . rosuvastatin (CRESTOR) 40 MG tablet TAKE 1 TABLET EVERY DAY 90 tablet 3  . Semaglutide,0.25 or 0.5MG /DOS, ([place],  0.25 OR 0.5 MG/DOSE,) 2 MG/1.5ML SOPN Inject 0.25 mg into the skin once a week. 4 pen 0  . Vitamin D, Ergocalciferol, (DRISDOL) 50000 units CAPS capsule Take 1 capsule (50,000 Units total) by mouth every 7 (seven) days. 4 capsule 0  . Fluticasone-Umeclidin-Vilant (TRELEGY ELLIPTA) 100-62.5-25 MCG/INH AEPB Inhale 1 puff into the lungs daily. (Patient not taking: Reported on 04/27/2018) 2 each 0   No current facility-administered medications for this visit.     Functional Status:  No flowsheet data found.  Fall/Depression Screening: Fall Risk  04/27/2018 02/24/2018 01/13/2018  Falls in the past year? 0 No No   PHQ 2/9 Scores 04/22/2018 04/08/2018 03/26/2018 05/29/2017 05/07/2017 04/22/2017 01/20/2017  PHQ - 2 Score 2 2 4  0 0 0 0  PHQ- 9 Score 4 6 19  - - - -  Exception Documentation - - Medical  reason - - - -    Assessment: Patient will continue to benefit from health coach outreach for disease management and support.  THN CM Care Plan Problem One     Most Recent Value  THN Long Term Goal   In 30 days the patient will have decreased her a1c by 2 points.  THN Long Term Goal Start Date  04/27/18  Interventions for Problem One Long Term Goal  Discussed with the patient about diet, exercise and medication management.      Plan: RN Health Coach will contact patient in the month of November and patient agrees to next outreach.   Lazaro Arms RN, BSN, [place] Direct Dial:  [phone]  Fax: [phone]

## 2018-04-28 ENCOUNTER — Other Ambulatory Visit: Payer: Self-pay | Admitting: Internal Medicine

## 2018-05-07 ENCOUNTER — Ambulatory Visit (INDEPENDENT_AMBULATORY_CARE_PROVIDER_SITE_OTHER): Payer: Medicare HMO | Admitting: Bariatrics

## 2018-05-07 ENCOUNTER — Ambulatory Visit (INDEPENDENT_AMBULATORY_CARE_PROVIDER_SITE_OTHER): Payer: Medicare HMO | Admitting: Psychology

## 2018-05-07 ENCOUNTER — Encounter (INDEPENDENT_AMBULATORY_CARE_PROVIDER_SITE_OTHER): Payer: Self-pay | Admitting: Bariatrics

## 2018-05-07 ENCOUNTER — Ambulatory Visit (INDEPENDENT_AMBULATORY_CARE_PROVIDER_SITE_OTHER): Payer: Self-pay | Admitting: Bariatrics

## 2018-05-07 VITALS — BP 133/84 | HR 88 | Temp 98.2°F | Ht 64.0 in | Wt 303.0 lb

## 2018-05-07 DIAGNOSIS — F3289 Other specified depressive episodes: Secondary | ICD-10-CM

## 2018-05-07 DIAGNOSIS — Z6841 Body Mass Index (BMI) 40.0 and over, adult: Secondary | ICD-10-CM

## 2018-05-07 DIAGNOSIS — I10 Essential (primary) hypertension: Secondary | ICD-10-CM | POA: Diagnosis not present

## 2018-05-07 DIAGNOSIS — E119 Type 2 diabetes mellitus without complications: Secondary | ICD-10-CM | POA: Diagnosis not present

## 2018-05-07 DIAGNOSIS — F330 Major depressive disorder, recurrent, mild: Secondary | ICD-10-CM

## 2018-05-07 NOTE — Progress Notes (Signed)
Office: [phone]  /  Fax: [phone]   Date: May 07, 2018   Time Seen: 4:35pm Duration: 28 minutes Provider: Glennie [name], Psy.D. Type of Session: Individual Therapy   HPI: [name] referred by [name] to depression with emotional eating behaviors. She was seen for an initial appointment by this provider on April 08, 2018. Per the note for the initial visit withDr. Norville [name] March 26, 2018,"Ceciliais struggling with emotional eating and using food for comfort to the extent that it is negatively impacting herhealth. Sheoften snacks when sheis not hungry. Ceciliasometimes feels sheis out of control and then feels guilty that shemade poor food choices. [name] Cory been working on behavior modification techniques to help reduce heremotional eating and [name] been somewhat successful.Sheshows no sign of suicidal or homicidal ideations."[name]'s Food and Mood (modified PHQ-9) score was19. In addition, per the note for the initial visit withDr. Norville [name] March 26, 2018,Ceciliahas been heavy most of her life and started gaining weight after each baby. Her heaviest weight ever was 323 pounds. During her initial appointment with [name] experiencing the following:significant food cravings issues; snacking frequently in the evenings; frequently drinking liquids with calories; frequently making poor food choices; frequently eating larger portions than normal; and struggling with emotional eating. During the initial appointment with this provider, [name] reported, "I've been a little more lonely and I'd been caring for my mother." She indicated her mother passed away 2017/10/01. [name] added she lives alone, and uses food for comfort. She reported a history of binge eating, and noted, "Eating until you're sick." [name] reported a belief she eats to also "fill time." [name] reported, "I've always had emotional eating since I've been grown.  I didn't worry about my weight as a child." She explained when she visited her daughter and grandchildren in [place], she did not feel the loneliness and did not feel the need to eat. She stated a desire to visit them again. She denied a history of purging and engagement in other compensatory strategies. [name] denied ever being diagnosed with an eating disorder.Furthermore, [name] asked to complete a questionnaire assessing various behaviors related to emotional eating. [name] the following: overeat when you are celebrating, experience food cravings on a regular basis, eat certain foods when you are anxious, stressed, depressed, or your feelings are hurt, use food to help you cope with emotional situations, find food is comforting to you, overeat when you are angry or upset, overeat when you are worried about something, overeat frequently when you are bored or lonely, not worry about what you eat when you are in a good mood, overeat when you are angry at someone just to show them they cannot control you, overeat when you are alone, but eat much less when you are with other people and eat as a reward.  Session Content: Session focused on the following treatment goal: decrease emotional eating. The session was initiated with the administration of the PHQ-9 and GAD-7, as well as a brief check-in. [name] reported she lost nine pounds and noted, "I am thrilled." She reported she did not experience any triggers for emotional eating and did not engage in emotional eating since the last appointment with this provider. She reported an increase in engaging in crafting and noted a decrease in stressors with her brother. Psychoeducation regarding the connection between thoughts, feelings, and behaviors was provided. [name] was given a handout outlining the aforementioned.  Additionally, psychoeducation regarding thought defusion was provided to [name] and she was led through  a thought defusion exercise  for which she utilized the thought "I am not good enough." She was given a handout with additional exercises to engage in between now and the next appointment. Based on [name]'s self-report and decrease in scores on the PHQ-9 and GAD-7, session concluded with a discussion regarding the frequency of appointments. Overall, [name] was receptive to today's appointment as evidenced by her openness to sharing, responsiveness to feedback, and willingness to engage in thought defusion.  Mental Status Examination: [name] arrived on time for the appointment; however, the appointment was initiated alte due to this provider. She presented as appropriately dressed and groomed. [name] appeared her stated age and demonstrated adequate orientation to time, place, person, and purpose of the appointment. She also demonstrated appropriate eye contact. No psychomotor abnormalities or behavioral peculiarities noted. Her mood was euthymic with congruent affect. Her thought processes were logical, linear, and goal-directed. No hallucinations, delusions, bizarre thinking or behavior reported or observed. Judgment, insight, and impulse control appeared to be grossly intact. There was no evidence of paraphasias (i.e., errors in speech, gross mispronunciations, and word substitutions), repetition deficits, or disturbances in volume or prosody (i.e., rhythm and intonation). There was no evidence of attention or memory impairments. [name] denied current suicidal and homicidal ideation, intent or plan.  Structured Assessment Results: The Patient Health Questionnaire-9 (PHQ-9) is a self-report measure that assesses symptoms and severity of depression over the course of the last two weeks. [name] [name] a score of one suggesting minimal depression. [name] the endorsed symptoms to be not difficult at all. Depression screen [name] 2/9 05/07/2018  Decreased Interest 0  Down, Depressed, Hopeless 0  PHQ - 2 Score 0  Altered  sleeping 1  Tired, decreased energy 0  Change in appetite 0  Feeling bad or failure about yourself  0  Trouble concentrating 0  Moving slowly or fidgety/restless 0  Suicidal thoughts 0  PHQ-9 Score 1  Difficult doing work/chores -  Some recent data might be hidden   The Generalized Anxiety Disorder-7 (GAD-7) is a brief self-report measure that assesses symptoms of anxiety over the course of the last two weeks. [name] a score of zero.  GAD 7 : Generalized Anxiety Score 05/07/2018  Nervous, Anxious, on Edge 0  Control/stop worrying 0  Worry too much - different things 0  Trouble relaxing 0  Restless 0  Easily annoyed or irritable 0  Afraid - awful might happen 0  Total GAD 7 Score 0  Anxiety Difficulty Not difficult at all   Interventions: [name] was administered the PHQ-9 and GAD-7 for symptom monitoring. Content from the last session was reviewed. Throughout today's session, empathic reflections and validation were provided. Psychoeducation regarding the connection between thoughts, feelings, and behaviors as well as thought defusion was provided. [name] was led through a thought defusion exercise.   DSM-5 Diagnosis: 296.31 (F33.0) Major Depressive Disorder, Recurrent Episode, Mild, With Anxious Distress, Mild  Treatment Goal & Progress: [name] was seen for an initial appointment with this provider on April 08, 2018 during which the following treatment goal was established: decrease emotional eating. [name] demonstrated progress in her goal of decreasing emotional eating as evidenced by increased awareness in hunger patterns and triggers for emotional eating. She also denied engaging in emotional eating since the last appointment with this provider.   Plan: Zadie continues to appear able and willing to participate as evidenced by engagement in reciprocal conversation, and asking questions for clarification as appropriate. The next appointment will be  scheduled in  three weeks. The next session will focus on reviewing thought defusion, and working towards the established treatment goal.

## 2018-05-13 ENCOUNTER — Encounter: Payer: Self-pay | Admitting: Internal Medicine

## 2018-05-13 ENCOUNTER — Ambulatory Visit: Payer: Medicare HMO | Admitting: Internal Medicine

## 2018-05-13 DIAGNOSIS — J4520 Mild intermittent asthma, uncomplicated: Secondary | ICD-10-CM

## 2018-05-13 DIAGNOSIS — G4733 Obstructive sleep apnea (adult) (pediatric): Secondary | ICD-10-CM | POA: Diagnosis not present

## 2018-05-13 NOTE — Progress Notes (Signed)
Office: [phone]  /  Fax: [phone]   HPI:   Chief Complaint: OBESITY [name] is here to discuss her progress with her obesity treatment plan. She is on the Category 4 plan and is following her eating plan approximately 80 % of the time. She states she is walking 10 minutes 3 times per week. [name] has struggled with the diet slightly due to illness and she had some GI issues. She states that her hunger is controlled.  Her weight is (!) 303 lb (137.4 kg) today and has had a weight loss of 9 pounds over a period of 2 weeks since her last visit. She has lost 20 lbs since starting treatment with [name].  Diabetes II [name] has a diagnosis of diabetes type II. [name] states that her fasting BG's range between 100 and 120's and her 2 hour post prandial sugars are around 100. She  denies any hypoglycemic episodes. Last A1c was 9.5 on 03/26/18. She is taking metformin 500mg  BID and Ozempic 0.25mg , which she has been on for 2 weeks. She has been working on intensive lifestyle modifications including diet, exercise, and weight loss to help control her blood glucose levels.  Hypertension [name] is a 67 y.o. female with hypertension. She is working on weight loss to help control her blood pressure with the goal of decreasing her risk of heart attack and stroke. She is taking Bystolic 10mg , Cardizem 120mg , and Cozaar 100mg . [name]'s blood pressure is currently well controlled.  Depression with emotional eating behaviors [name] is struggling with emotional eating and using food for comfort to the extent that it is negatively impacting her health. She often snacks when she is not hungry. [name] sometimes feels she is out of control and then feels guilty that she made poor food choices. She has been working on behavior modification techniques to help reduce her emotional eating and has been somewhat successful. [name] is seeing Dr. Mallie Mussel, our bariatric psychologist. She shows no sign of  suicidal or homicidal ideations.  ALLERGIES: Allergies  Allergen Reactions  . No Known Allergies     MEDICATIONS: Current Outpatient Medications on File Prior to Visit  Medication Sig Dispense Refill  . acetaminophen (TYLENOL) 500 MG tablet Take 1,000 mg by mouth 2 (two) times daily as needed for moderate pain or headache.    . albuterol (PROVENTIL HFA;VENTOLIN HFA) 108 (90 Base) MCG/ACT inhaler Inhale 2 puffs into the lungs every 6 (six) hours as needed for wheezing or shortness of breath. 1 Inhaler 12  . anastrozole (ARIMIDEX) 1 MG tablet TAKE 1 TABLET (1 MG TOTAL) BY MOUTH DAILY. 90 tablet 3  . aspirin EC 81 MG tablet Take 81 mg by mouth at bedtime.     Marland Kitchen BREO ELLIPTA 100-25 MCG/INH AEPB INHALE 1 PUFF INTO LUNGS ONCE A DAY 60 each 0  . BYSTOLIC 10 MG tablet TAKE 1 TABLET (10 MG TOTAL) BY MOUTH AT BEDTIME. 90 tablet 2  . diclofenac (VOLTAREN) 75 MG EC tablet TAKE 1 TABLET TWICE DAILY 180 tablet 1  . diltiazem (CARDIZEM CD) 120 MG 24 hr capsule TAKE 1 CAPSULE (120 MG TOTAL) BY MOUTH DAILY. 90 capsule 2  . escitalopram (LEXAPRO) 20 MG tablet TAKE 1 TABLET (20 MG TOTAL) BY MOUTH DAILY. 90 tablet 1  . esomeprazole (NEXIUM) 40 MG capsule Take 1 capsule (40 mg total) by mouth daily. 90 capsule 2  . ezetimibe (ZETIA) 10 MG tablet TAKE 1 TABLET EVERY DAY 90 tablet 3  . famotidine (PEPCID) 20  MG tablet Take 1 tablet (20 mg total) by mouth 2 (two) times daily as needed for heartburn or indigestion. 60 tablet 3  . fluticasone (FLONASE) 50 MCG/ACT nasal spray Place 2 sprays into both nostrils daily. 16 g 6  . furosemide (LASIX) 20 MG tablet TAKE 1 TABLET (20 MG TOTAL) BY MOUTH DAILY. 90 tablet 2  . Hypromellose (ARTIFICIAL TEARS OP) Place 1 drop into both eyes daily as needed (dry eyes).     Marland Kitchen loratadine (CLARITIN) 10 MG tablet Take 1 tablet (10 mg total) by mouth daily. 30 tablet 11  . losartan (COZAAR) 100 MG tablet TAKE 1 TABLET EVERY DAY 90 tablet 2  . metFORMIN (GLUCOPHAGE) 500 MG tablet Take  1 tablet (500 mg total) by mouth 2 (two) times daily with a meal. 180 tablet 3  . montelukast (SINGULAIR) 10 MG tablet TAKE 1 TABLET (10 MG TOTAL) BY MOUTH AT BEDTIME. 90 tablet 2  . Probiotic Product (PROBIOTIC PO) Take 1 capsule by mouth at bedtime.    . rosuvastatin (CRESTOR) 40 MG tablet TAKE 1 TABLET EVERY DAY 90 tablet 3  . Semaglutide,0.25 or 0.5MG /DOS, (OZEMPIC, 0.25 OR 0.5 MG/DOSE,) 2 MG/1.5ML SOPN Inject 0.25 mg into the skin once a week. 4 pen 0  . Vitamin D, Ergocalciferol, ([name]) 50000 units CAPS capsule Take 1 capsule (50,000 Units total) by mouth every 7 (seven) days. 4 capsule 0   No current facility-administered medications on file prior to visit.     PAST MEDICAL HISTORY: Past Medical History:  Diagnosis Date  . Anemia 04/05/2012  . Anxiety   . Anxiety state 09/11/2007   Qualifier: Diagnosis of  By: Scherrie Gerlach    . Asthma   . Atrial tachycardia, paroxysmal ([place])   . Back pain 10/02/2014  . Breast cancer ([place]) 02/17/2017  . Chicken pox as a child  . Chronic diastolic CHF (congestive heart failure), NYHA class 1 ([place])   . Complication of anesthesia    pt states feels different in her body after anesthesia when waking up and also experiences a smell of burnt plastic for approx a wk   . Constipation   . Depression   . Diabetes ([place])   . Dilated aortic root ([place])    49mm by echo 09/2016  . Dysuria 02/17/2017  . Elevated LFTs 04/05/2012  . GERD (gastroesophageal reflux disease)   . Goiter   . Heart murmur    hx of one at birth   . History of kidney stones   . History of radiation therapy 10/31/16-12/17/16   left breast 45 Gy in 25 fractions, left breast boost 16 Gy in 8 fractions  . Hx of colonic polyps   . Hyperlipidemia   . Hypertension   . Insomnia 10/08/2016  . Joint pain   . Malignant neoplasm of upper-outer quadrant of left female breast ([place]) 05/14/2016   not with patient  . Measles as a child  . Mumps as a child  . OA (osteoarthritis) of knee  04/05/2012  . Obesity 07/11/2014  . PONV (postoperative nausea and vomiting)   . Preventative health care 09/05/2013  . PVC's (premature ventricular contractions) 05/02/2016  . Shortness of breath dyspnea    walking distances / climbing stairs  . Sleep apnea 04/05/2012  . Tinea corporis 02/23/2013  . Type 2 diabetes mellitus with hyperglycemia ([place]) 12/31/2013  . Vasomotor rhinitis 04/05/2012  . Ventral hernia   . Wheezing     PAST SURGICAL HISTORY: Past Surgical History:  Procedure  Laterality Date  . BREAST LUMPECTOMY WITH RADIOACTIVE SEED AND SENTINEL LYMPH NODE BIOPSY Left 06/21/2016   Procedure: LEFT BREAST LUMPECTOMY WITH RADIOACTIVE SEED AND SENTINEL LYMPH NODE BIOPSY, INJECT BLUE DYE LEFT BREAST;  Surgeon: Fanny Skates, MD;  Location: [facility];  Service: General;  Laterality: Left;  . CARDIAC CATHETERIZATION     normal coroary arteries per patient  . CARDIOVASCULAR STRESS TEST     10/12/2013  . CESAREAN SECTION     X 3  . CHOLECYSTECTOMY    . COLONOSCOPY WITH PROPOFOL N/A 03/28/2015   Procedure: COLONOSCOPY WITH PROPOFOL;  Surgeon: Juanita Craver, MD;  Location: [facility];  Service: Endoscopy;  Laterality: N/A;  . HERNIA REPAIR  02/08/11   ventral hernia  . JOINT REPLACEMENT     bilateral  . KNEE ARTHROSCOPY  05/2010   bilateral  . LEFT AND RIGHT HEART CATHETERIZATION WITH CORONARY ANGIOGRAM N/A 11/08/2013   Procedure: LEFT AND RIGHT HEART CATHETERIZATION WITH CORONARY ANGIOGRAM;  Surgeon: [name], MD;  Location: [facility];  Service: Cardiovascular;  Laterality: N/A;  . MENISCUS REPAIR  2009  . MOUTH SURGERY     teeth implants  . PORT-A-CATH REMOVAL N/A 01/24/2017   Procedure: REMOVAL PORT-A-CATH;  Surgeon: Fanny Skates, MD;  Location: [place];  Service: General;  Laterality: N/A;  . PORTACATH PLACEMENT Right 07/16/2016   Procedure: INSERTION PORT-A-CATH RIGHT INTERNAL JUGULAR WITH ULTRASOUND;  Surgeon: Fanny Skates, MD;  Location: [place];  Service: General;   Laterality: Right;  . TOTAL KNEE ARTHROPLASTY  2011   left  . TOTAL KNEE ARTHROPLASTY Right 05/10/2014   Procedure: RIGHT TOTAL KNEE ARTHROPLASTY;  Surgeon: Mauri Pole, MD;  Location: [facility];  Service: Orthopedics;  Laterality: Right;  . TUBAL LIGATION    . WISDOM TOOTH EXTRACTION  2000    SOCIAL HISTORY: Social History   Tobacco Use  . Smoking status: Never Smoker  . Smokeless tobacco: Never Used  Substance Use Topics  . Alcohol use: Yes    Comment: rarely  . Drug use: No    FAMILY HISTORY: Family History  Problem Relation Age of Onset  . Thyroid disease Mother   . Hyperlipidemia Mother   . Depression Mother   . Anxiety disorder Mother   . Heart attack Father 61  . Hypertension Father   . Arthritis Father        RA  . Coronary artery disease Father   . High Cholesterol Father   . Coronary artery disease Brother   . Heart disease Brother   . Cancer Maternal Aunt        colon  . Cancer Maternal Grandmother        colon  . Heart attack Maternal Grandfather   . Alcohol abuse Paternal Grandfather     ROS: Review of Systems  Constitutional: Positive for weight loss.  Endo/Heme/Allergies:       Negative for hypoglycemia.  Psychiatric/Behavioral: Positive for depression. Negative for suicidal ideas.       Negative for homicidal ideations.    PHYSICAL EXAM: Blood pressure 133/84, pulse 88, temperature 98.2 F (36.8 C), temperature source Oral, height 5\' 4"  (1.626 m), weight (!) 303 lb (137.4 kg), SpO2 99 %. Body mass index is 52.01 kg/m. Physical Exam  Constitutional: She is oriented to person, place, and time. She appears well-developed and well-nourished.  Cardiovascular: Normal rate.  Pulmonary/Chest: Effort normal.  Musculoskeletal: Normal range of motion.  Neurological: She is oriented to person, place, and time.  Skin: Skin is warm and dry.  Psychiatric: She has a normal mood and affect. Her behavior is normal.  Vitals reviewed.   RECENT LABS AND  TESTS: BMET    Component Value Date/Time   NA 140 03/26/2018 1024   NA 145 04/11/2017 0942   K 4.6 03/26/2018 1024   K 4.4 04/11/2017 0942   CL 98 03/26/2018 1024   CO2 21 03/26/2018 1024   CO2 26 04/11/2017 0942   GLUCOSE 225 (H) 03/26/2018 1024   GLUCOSE 221 (H) 12/22/2017 0907   GLUCOSE 156 (H) 04/11/2017 0942   BUN 14 03/26/2018 1024   BUN 15.1 04/11/2017 0942   CREATININE 0.94 03/26/2018 1024   CREATININE 0.87 10/06/2017 1010   CREATININE 0.8 04/11/2017 0942   CALCIUM 9.9 03/26/2018 1024   CALCIUM 10.0 04/11/2017 0942   GFRNONAA 63 03/26/2018 1024   GFRNONAA >60 10/06/2017 1010   GFRAA 73 03/26/2018 1024   GFRAA >60 10/06/2017 1010   Lab Results  Component Value Date   HGBA1C 9.5 (H) 03/26/2018   HGBA1C 8.7 (H) 12/22/2017   HGBA1C 7.5 (H) 09/22/2017   HGBA1C 7.2 (H) 05/22/2017   HGBA1C 7.3 (H) 02/17/2017   Lab Results  Component Value Date   INSULIN 57.0 (H) 03/26/2018   CBC    Component Value Date/Time   WBC 9.0 03/26/2018 1024   WBC 7.5 12/22/2017 0907   RBC 4.95 03/26/2018 1024   RBC 4.87 12/22/2017 0907   HGB 12.4 03/26/2018 1024   HGB 12.2 04/11/2017 0942   HCT 40.3 03/26/2018 1024   HCT 39.1 04/11/2017 0942   PLT 282.0 12/22/2017 0907   PLT 274 10/06/2017 1010   PLT 240 04/11/2017 0942   MCV 81 03/26/2018 1024   MCV 81.6 04/11/2017 0942   MCH 25.1 (L) 03/26/2018 1024   MCH 25.6 10/06/2017 1010   MCHC 30.8 (L) 03/26/2018 1024   MCHC 31.1 12/22/2017 0907   RDW 17.1 (H) 03/26/2018 1024   RDW 19.2 (H) 04/11/2017 0942   LYMPHSABS 1.5 03/26/2018 1024   LYMPHSABS 1.4 04/11/2017 0942   MONOABS 0.5 12/22/2017 0907   MONOABS 0.3 04/11/2017 0942   EOSABS 0.0 03/26/2018 1024   BASOSABS 0.0 03/26/2018 1024   BASOSABS 0.0 04/11/2017 0942   Iron/TIBC/Ferritin/ %Sat No results found for: IRON, TIBC, FERRITIN, IRONPCTSAT Lipid Panel     Component Value Date/Time   CHOL 126 03/26/2018 1024   TRIG 188 (H) 03/26/2018 1024   HDL 34 (L) 03/26/2018 1024     CHOLHDL 3 12/22/2017 0907   VLDL 33.2 12/22/2017 0907   LDLCALC 54 03/26/2018 1024   LDLDIRECT 108.0 10/08/2016 0849   Hepatic Function Panel     Component Value Date/Time   PROT 7.1 03/26/2018 1024   PROT 7.5 04/11/2017 0942   ALBUMIN 4.0 03/26/2018 1024   ALBUMIN 3.6 04/11/2017 0942   AST 65 (H) 03/26/2018 1024   AST 33 10/06/2017 1010   AST 41 (H) 04/11/2017 0942   ALT 44 (H) 03/26/2018 1024   ALT 30 10/06/2017 1010   ALT 40 04/11/2017 0942   ALKPHOS 121 (H) 03/26/2018 1024   ALKPHOS 93 04/11/2017 0942   BILITOT 0.4 03/26/2018 1024   BILITOT 0.5 10/06/2017 1010   BILITOT 0.48 04/11/2017 0942   BILIDIR 0.15 06/19/2017 0950   IBILI 0.3 05/24/2016 0828      Component Value Date/Time   TSH 2.130 03/26/2018 1024   TSH 1.87 12/22/2017 0907   TSH 0.81 09/22/2017 0850   Results  for KHALILA, BUECHNER (MRN [id number]) as of 05/13/2018 12:29  Ref. Range 03/26/2018 10:24  Vitamin D, 25-Hydroxy Latest Ref Range: 30.0 - 100.0 ng/mL 9.1 (L)   ASSESSMENT AND PLAN: Type 2 diabetes mellitus without complication, without long-term current use of insulin (HCC)  Essential hypertension  Other depression - with emotional eating  Class 3 severe obesity with serious comorbidity and body mass index (BMI) of 50.0 to 59.9 in adult, unspecified obesity type ([place])  PLAN:  Diabetes II Luka has been given extensive diabetes education by myself today including ideal fasting and post-prandial blood glucose readings, individual ideal Hgb A1c goals, and hypoglycemia prevention. We discussed the importance of good blood sugar control to decrease the likelihood of diabetic complications such as nephropathy, neuropathy, limb loss, blindness, coronary artery disease, and death. We discussed the importance of intensive lifestyle modification including diet, exercise and weight loss as the first line treatment for diabetes. We will change her Ozempic to 0.5 mg at the next visit. Cherysh agrees that she  will follow up at the agreed upon time in 2 weeks.  Hypertension We discussed sodium restriction, working on healthy weight loss, and a regular exercise program as the means to achieve improved blood pressure control. We will continue to monitor her blood pressure as well as her progress with the above lifestyle modifications. She will continue her antihypertensive medications as prescribed and will watch for signs of hypotension as she continues her lifestyle modifications. Clodagh agreed with this plan and agreed to follow up as directed.  Depression with Emotional Eating Behaviors We discussed cognitive behavioral therapy strategies today to help Alvaretta deal with her emotional eating and depression. She has agreed to see Dr. Mallie Mussel today at 4:30 and will follow up accordingly.  I spent > than 50% of the 15 minute visit on counseling as documented in the note.  Obesity Emmelina is currently in the action stage of change. As such, her goal is to continue with weight loss efforts. She has agreed to follow the Category 4 plan. Miana has been instructed to work up to a goal of 150 minutes of combined cardio and strengthening exercise per week for weight loss and overall health benefits. We discussed the following Behavioral Modification Strategies today: increasing lean protein intake, decreasing simple carbohydrates, increasing vegetables, increase H2O intake, no skipping meals, and meal planning and cooking strategies.  Catalea has agreed to follow up with our clinic in 2 weeks. She was informed of the importance of frequent follow up visits to maximize her success with intensive lifestyle modifications for her multiple health conditions.   OBESITY BEHAVIORAL INTERVENTION VISIT  Today's visit was # 3   Starting weight: 323 Starting date: 03/26/18 Today's weight : Weight: (!) 303 lb (137.4 kg)  Today's date: 05/07/2018 Total lbs lost to date: 20  ASK: We discussed the diagnosis of  obesity with [name] today and Lillianna agreed to give [name] permission to discuss obesity behavioral modification therapy today.  ASSESS: Martiza has the diagnosis of obesity and her BMI today is 51.25. Aleisha is in the action stage of change.   ADVISE: Monia was educated on the multiple health risks of obesity as well as the benefit of weight loss to improve her health. She was advised of the need for long term treatment and the importance of lifestyle modifications to improve her current health and to decrease her risk of future health problems.  AGREE: Multiple dietary modification options and treatment options were discussed and  Vicci agreed to follow the recommendations documented in the above note.  ARRANGE: Remedios was educated on the importance of frequent visits to treat obesity as outlined per CMS and USPSTF guidelines and agreed to schedule her next follow up appointment today.  I, Marcille Blanco, am acting as Location manager for [employer]. Owens Shark, DO  I have reviewed the above documentation for accuracy and completeness, and I agree with the above. -Jearld Lesch, DO

## 2018-05-13 NOTE — Patient Instructions (Signed)
We can continue CPAP auto 5-15, mask of choice, humidifier, supplies, AirView  Please call if we can help 

## 2018-05-13 NOTE — Progress Notes (Signed)
HPI female never smoker followed for OSA, lung restriction, dyspnea on exertion, asthmatic bronchitis, complicated by chronic sinusitis, anxiety and depression, paroxysmal atrial tachycardia, CAD, breast cancer left, morbid obesity Unattended Home Sleep Test 11/05/13-AHI 68.1/hour, desaturation to 62%, body weight 320 pounds Echocardiogram 12/04/16- Nl EF Office Spirometry 02/06/17-moderate restriction of exhaled volume. FVC 2.02/61%, FEV1 1.75/69%, ratio 0.86, FEF 25-75% 2.45/113% PFT- 03/12/17-moderate obstructive airways disease with significant response to bronchodilator, normal diffusion. FVC 2.25/69%, FEV1 1.88/76%, ratio 0.84, TLC 80%, DLCO 86% --------------------------------------------------------------------------------------------.  01/08/2018- 67 year old female never smoker followed for OSA, lung restriction, dyspnea on exertion, asthmatic bronchitis, complicated by chronic sinusitis, anxiety and depression, paroxysmal atrial tachycardia, CAD, breast cancer left, morbid obesity, goiter,  CPAP auto 5-15/Advanced Singulair, Breo 100, albuterol HFA, -----Breathing is not doing well she is wheezing, she has recently had a sinus infection and put on ABX for that. Cpap is going well. Weight today 326 pounds Notices more wheeze with exertion, especially if outdoors in this hot weather.  Little sputum.  Rescue inhaler is used once or twice daily and does help. CPAP doing very well except she still notes a scorched area on the humidifier which she has discussed with her DME company. Download 100% compliance AHI 0.5/hour  11/21/2017- 67 year old female never smoker followed for OSA, lung restriction, dyspnea on exertion, asthmatic bronchitis, complicated by chronic sinusitis, anxiety and depression, paroxysmal atrial tachycardia, CAD, breast cancer left, morbid obesity, goiter,  CPAP auto 5-15/Advanced -----OSA: DME: [facility] Pt wears CPAP nightly and DL attached. Send order for new supplies.  Body  weight 308 pounds Breo 100,  Singulair, albuterol HFA, Flonase, She thought Trelegy burned her throat and prefers Breo.  Has been breathing better with decreased wheeze since finishing cancer treatment 2 years ago.  Had chemo/XRT. Happy with her CPAP.  Download 100% compliance AHI 0.9/hour. Weight loss program-Down 18 pounds so far.  ROS-see HPI   + = Positive Constitutional:    +weight loss, night sweats, fevers, chills, fatigue, lassitude. HEENT:   + headaches, difficulty swallowing, tooth/dental problems, sore throat,       sneezing, itching, ear ache, + nasal congestion, post nasal drip, snoring CV:    chest pain, orthopnea, PND, swelling in lower extremities, anasarca,                                                  dizziness, palpitations Resp:   + shortness of breath with exertion or at rest.                productive cough,   non-productive cough, coughing up of blood.              change in color of mucus.  + wheezing.   Skin:    rash or lesions. GI:  No-   heartburn, indigestion, abdominal pain, nausea, vomiting, diarrhea,                 change in bowel habits, loss of appetite GU: dysuria, change in color of urine, no urgency or frequency.   flank pain. MS:   joint pain, stiffness, decreased range of motion, back pain. Neuro-     nothing unusual Psych:  change in mood or affect.  depression or anxiety.   memory loss.  OBJ- Physical Exam General- Alert, Oriented, Affect-appropriate, Distress- none acute, + morbidly obese Skin- rash-none, lesions-  none, excoriation- none Lymphadenopathy- none Head- atraumatic,             Eyes- Gross vision intact, PERRLA, conjunctivae and secretions clear            Ears- Hearing, canals-normal            Nose-  turbinate edema, no-Septal dev, mucus, polyps, erosion, perforation,+ less inspiratory stridor than in past.            Throat- Mallampati III , mucosa clear , drainage- none, tonsils- atrophic Neck- flexible , trachea midline ,  thyroid R mass identified as" goiter" , carotid no bruit Chest - symmetrical excursion , unlabored           Heart/CV- RRR , no murmur , no gallop  , no rub, nl s1 s2                           - JVD- none , edema- none, stasis changes- none, varices- none           Lung- clear,  wheeze- none, cough- none , dullness-none, rub- none           Chest wall-  Abd-  Br/ Gen/ Rectal- Not done, not indicated Extrem- cyanosis- none, clubbing, none, atrophy- none, strength- nl Neuro- grossly intact to observation

## 2018-05-14 ENCOUNTER — Ambulatory Visit
Admission: RE | Admit: 2018-05-14 | Discharge: 2018-05-14 | Disposition: A | Discharge status: Home / Self Care | Payer: Medicare HMO | Source: Ambulatory Visit | Attending: Family Medicine | Admitting: Family Medicine

## 2018-05-14 DIAGNOSIS — Z853 Personal history of malignant neoplasm of breast: Secondary | ICD-10-CM | POA: Diagnosis not present

## 2018-05-14 DIAGNOSIS — R928 Other abnormal and inconclusive findings on diagnostic imaging of breast: Secondary | ICD-10-CM | POA: Diagnosis not present

## 2018-05-17 NOTE — Assessment & Plan Note (Signed)
She is having success with a weight loss program and is strongly encouraged to continue.

## 2018-05-17 NOTE — Assessment & Plan Note (Signed)
Little need for rescue inhaler.  Satisfied with Breo.  She thought that Trelegy burned her throat-possibly over dried.

## 2018-05-17 NOTE — Assessment & Plan Note (Signed)
Continues to benefit from CPAP with improved sleep. Plan-continue CPAP auto 5-15

## 2018-05-26 NOTE — Progress Notes (Signed)
Office: [phone]  /  Fax: [phone]   Date: May 27, 2018  Time Seen: 7:50am Duration: 25 minutes Provider: Glennie Isle, Psy.D. Type of Session: Individual Therapy   HPI: [name] referred by Dr. Ronney Asters to depression with emotional eating behaviors.She was seen for an initial appointment by this provider on April 08, 2018.Per the note for the initial visit withDr. Norville Haggard March 26, 2018,"Ceciliais struggling with emotional eating and using food for comfort to the extent that it is negatively impacting herhealth. Sheoften snacks when sheis not hungry. Ceciliasometimes feels sheis out of control and then feels guilty that shemade poor food choices. [name] been working on behavior modification techniques to help reduce heremotional eating and has been somewhat successful.Sheshows no sign of suicidal or homicidal ideations."Kailani's Food and Mood (modified PHQ-9) score was19.In addition, per the note for the initial visit withDr. Norville Haggard March 26, 2018,Ceciliahas been heavy most of her life and started gaining weight after each baby. Her heaviest weight ever was 323 pounds. During her initial appointment with Dr. Teola Bradley experiencing the following:significant food cravings issues; snacking frequently in the evenings; frequently drinking liquids with calories; frequently making poor food choices; frequently eating larger portions than normal; and struggling with emotional eating.During the initial appointment with this provider, Emelina reported, "I've been a little more lonely and I'd been caring for my mother." She indicated her mother passed away 09-06-2017. Avarie added she lives alone, and uses food for comfort. She reported a history of binge eating, and noted, "Eating until you're sick." Kayloni reported a belief she eats to also "fill time." Safiyya reported, "I've always had emotional eating since I've been grown. I  didn't worry about my weight as a child." She explained when she visited her daughter and grandchildren in [place], she did not feel the loneliness and did not feel the need to eat. She stated a desire to visit them again. She denied a history of purging and engagement in other compensatory strategies. Velda denied ever being diagnosed with an eating disorder.Furthermore,[name] to complete a questionnaire assessing various behaviors related to emotional eating. [name] the following: overeat when you are celebrating, experience food cravings on a regular basis, eat certain foods when you are anxious, stressed, depressed, or your feelings are hurt, use food to help you cope with emotional situations, find food is comforting to you, overeat when you are angry or upset, overeat when you are worried about something, overeat frequently when you are bored or lonely, not worry about what you eat when you are in a good mood, overeat when you are angry at someone just to show them they cannot control you, overeat when you are alone, but eat much less when you are with other people and eat as a reward.  Session Content: Session focused on the following treatment goal: decrease emotional eating. The session was initiated with the administration of the PHQ-9 and GAD-7, as well as a brief check-in. Quynh reported Thanksgiving was "good."  More specifically, she denied "overindulging" and shared she sent leftovers with family members to avoid temptation. She also noted an improvement in her relationship with her brother. Since the last appointment with this provider, Betzaira denied engaging in emotional eating, but reported observing emotional hunger secondary to boredom. As such, this provider and Elianna discussed utilizing thought defusion as a coping skill. Carlia was led through a thought defusion exercise (I.e., "Leaves on A Stream") during today's appointment. Following the appointment, she  noted, "It was really  pleasant." Luise was provided with a handout with the script for the exercise. Remainder of session focused on pleasurable activities to further assist with boredom and subsequent emotional eating. Psychoeducation regarding the aforementioned was provided and she was provided a handout with various activities. She noted, "I see a lot of things I love." Session concluded with a discussion regarding the next appointment, and possible termination. Overall, Bianey was receptive to today's appointment as evidenced by her openness to sharing, responsiveness to feedback, engagement in the thought defusion exercise, and willingness to engage in pleasurable activities between now and the next appointment.  Mental Status Examination: Shaylyn arrived early for the appointment; therefore, the appointment was initiated early. She presented as appropriately dressed and groomed. Elicia appeared her stated age and demonstrated adequate orientation to time, place, person, and purpose of the appointment. She also demonstrated appropriate eye contact. No psychomotor abnormalities or behavioral peculiarities noted. Her mood was euthymic with congruent affect. Her thought processes were logical, linear, and goal-directed. No hallucinations, delusions, bizarre thinking or behavior reported or observed. Judgment, insight, and impulse control appeared to be grossly intact. There was no evidence of paraphasias (i.e., errors in speech, gross mispronunciations, and word substitutions), repetition deficits, or disturbances in volume or prosody (i.e., rhythm and intonation). There was no evidence of attention or memory impairments. Aneesa denied current suicidal and homicidal ideation, intent or plan.  Structured Assessment Results: The Patient Health Questionnaire-9 (PHQ-9) is a self-report measure that assesses symptoms and severity of depression over the course of the last two weeks. Equilla obtained a score of  zero. Depression screen PHQ 2/9 05/27/2018  Decreased Interest 0  Down, Depressed, Hopeless 0  PHQ - 2 Score 0  Altered sleeping 0  Tired, decreased energy 0  Change in appetite 0  Feeling bad or failure about yourself  0  Trouble concentrating 0  Moving slowly or fidgety/restless 0  Suicidal thoughts 0  PHQ-9 Score 0  Difficult doing work/chores -  Some recent data might be hidden   The Generalized Anxiety Disorder-7 (GAD-7) is a brief self-report measure that assesses symptoms of anxiety over the course of the last two weeks. Elda obtained a score of zero. GAD 7 : Generalized Anxiety Score 05/27/2018  Nervous, Anxious, on Edge 0  Control/stop worrying 0  Worry too much - different things 0  Trouble relaxing 0  Restless 0  Easily annoyed or irritable 0  Afraid - awful might happen 0  Total GAD 7 Score 0  Anxiety Difficulty -   Interventions: Jina was administered the PHQ-9 and GAD-7 for symptom monitoring. Content from the last session was reviewed. Throughout today's session, empathic reflections and validation were provided. Further psychoeducation regarding thought defusion was provided and Alania was led through an exercise. In addition, psychoeducation regarding pleasurable activities was provided.   DSM-5 Diagnosis: 296.31 (F33.0) Major Depressive Disorder, Recurrent Episode, Mild, With Anxious Distress, Mild  Treatment Goal & Progress: Denita was seen for an initial appointment with this provider on April 08, 2018 during which the following treatment goal was established: decrease emotional eating. Maryuri has demonstrated progress in her goal of decreasing emotional eating as evidenced by increased awareness in hunger patterns and triggers for emotional eating. She also continues to deny engaging in emotional eating.   Plan: Azyriah continues to appear able and willing to participate as evidenced by engagement in reciprocal conversation, and asking questions for  clarification as appropriate. The next appointment will be scheduled in one month. The next session  will focus on reviewing learned skills, and potential termination of services.

## 2018-05-27 ENCOUNTER — Encounter (INDEPENDENT_AMBULATORY_CARE_PROVIDER_SITE_OTHER): Payer: Self-pay | Admitting: Bariatrics

## 2018-05-27 ENCOUNTER — Ambulatory Visit (INDEPENDENT_AMBULATORY_CARE_PROVIDER_SITE_OTHER): Payer: Medicare HMO | Admitting: Psychology

## 2018-05-27 ENCOUNTER — Ambulatory Visit (INDEPENDENT_AMBULATORY_CARE_PROVIDER_SITE_OTHER): Payer: Medicare HMO | Admitting: Bariatrics

## 2018-05-27 VITALS — BP 135/80 | HR 58 | Temp 97.8°F | Ht 65.0 in | Wt 300.0 lb

## 2018-05-27 DIAGNOSIS — F330 Major depressive disorder, recurrent, mild: Secondary | ICD-10-CM

## 2018-05-27 DIAGNOSIS — Z9189 Other specified personal risk factors, not elsewhere classified: Secondary | ICD-10-CM | POA: Diagnosis not present

## 2018-05-27 DIAGNOSIS — E1165 Type 2 diabetes mellitus with hyperglycemia: Secondary | ICD-10-CM

## 2018-05-27 DIAGNOSIS — E119 Type 2 diabetes mellitus without complications: Secondary | ICD-10-CM | POA: Diagnosis not present

## 2018-05-27 DIAGNOSIS — E559 Vitamin D deficiency, unspecified: Secondary | ICD-10-CM

## 2018-05-27 DIAGNOSIS — Z6841 Body Mass Index (BMI) 40.0 and over, adult: Secondary | ICD-10-CM

## 2018-05-27 DIAGNOSIS — F3289 Other specified depressive episodes: Secondary | ICD-10-CM | POA: Diagnosis not present

## 2018-05-27 MED ORDER — SEMAGLUTIDE(0.25 OR 0.5MG/DOS) 2 MG/1.5ML ~~LOC~~ SOPN: 0.2500 mg | PEN_INJECTOR | SUBCUTANEOUS | 0 refills | Status: DC

## 2018-05-27 MED ORDER — VITAMIN D (ERGOCALCIFEROL) 1.25 MG (50000 UNIT) PO CAPS: 50000.0000 [IU] | ORAL_CAPSULE | ORAL | 0 refills | Status: DC

## 2018-05-28 ENCOUNTER — Other Ambulatory Visit: Payer: Self-pay

## 2018-05-28 NOTE — Telephone Encounter (Signed)
This encounter was created in error - please disregard.

## 2018-05-28 NOTE — Patient Outreach (Signed)
[place] [facility] [place] [facility]) Care Management  05/28/2018  JAIYAH BEINING [birth year] [phone]    1st outreach attempt to the patient for assessment. No answer. HIPAA compliant voicemail left with contact information.  Plan: [place] will make outreach attempt the patient within in one month.   Lazaro Arms RN, BSN, [place] Direct Dial:  [phone]  Fax: [phone]

## 2018-06-01 ENCOUNTER — Other Ambulatory Visit (INDEPENDENT_AMBULATORY_CARE_PROVIDER_SITE_OTHER): Payer: Self-pay | Admitting: Bariatrics

## 2018-06-01 ENCOUNTER — Other Ambulatory Visit (INDEPENDENT_AMBULATORY_CARE_PROVIDER_SITE_OTHER): Payer: Self-pay

## 2018-06-01 ENCOUNTER — Telehealth (INDEPENDENT_AMBULATORY_CARE_PROVIDER_SITE_OTHER): Payer: Self-pay | Admitting: Bariatrics

## 2018-06-01 DIAGNOSIS — E119 Type 2 diabetes mellitus without complications: Secondary | ICD-10-CM

## 2018-06-01 DIAGNOSIS — E1165 Type 2 diabetes mellitus with hyperglycemia: Secondary | ICD-10-CM

## 2018-06-01 MED ORDER — SEMAGLUTIDE(0.25 OR 0.5MG/DOS) 2 MG/1.5ML ~~LOC~~ SOPN: 0.5000 mg | PEN_INJECTOR | SUBCUTANEOUS | 0 refills | Status: DC

## 2018-06-01 MED ORDER — GLUCOSE BLOOD VI STRP: ORAL_STRIP | 0 refills | Status: DC

## 2018-06-01 NOTE — Progress Notes (Signed)
Office: [phone]  /  Fax: [phone]   HPI:   Chief Complaint: OBESITY [name] is here to discuss her progress with her obesity treatment plan. She is on the Category 4 plan and is following her eating plan approximately 80 % of the time. She states she is walking 10 minutes 3 times per week. [name] is doing well with the Category 4 plan. She is taking Semaglutide (Ozempic). [name] is not struggling with hunger. She does have occasional cravings. Her weight is 300 lb (136.1 kg) today and has had a weight loss of 3 pounds over a period of 3 weeks since her last visit. She has lost 23 lbs since starting treatment with [name].  Vitamin D deficiency [name] has a diagnosis of vitamin D deficiency. She is currently taking vit D and denies nausea, vomiting or muscle weakness.  At risk for osteopenia and osteoporosis [name] is at higher risk of osteopenia and osteoporosis due to vitamin D deficiency.   Diabetes II [name] has a diagnosis of diabetes type II. She is taking Ozempic and Metformin. Lempi denies any hypoglycemic episodes. Last A1c was at 9.5 She has been working on intensive lifestyle modifications including diet, exercise, and weight loss to help control her blood glucose levels.  Depression with emotional eating behaviors [name] saw Dr. Mallie Mussel our bariatric psychologist. [name] struggles with emotional eating and using food for comfort to the extent that it is negatively impacting her health. She often snacks when she is not hungry. Dot sometimes feels she is out of control and then feels guilty that she made poor food choices. She has been working on behavior modification techniques to help reduce her emotional eating and has been somewhat successful. She shows no sign of suicidal or homicidal ideations.  Depression screen [name] 2/9 05/27/2018 05/07/2018 04/22/2018 04/08/2018 03/26/2018  Decreased Interest 0 0 1 1 2   Down, Depressed, Hopeless 0 0 1 1 2   PHQ - 2 Score 0 0  2 2 4   Altered sleeping 0 1 0 1 3  Tired, decreased energy 0 0 1 1 3   Change in appetite 0 0 1 1 3   Feeling bad or failure about yourself  0 0 0 1 3  Trouble concentrating 0 0 0 0 1  Moving slowly or fidgety/restless 0 0 0 0 0  Suicidal thoughts 0 0 0 0 2  PHQ-9 Score 0 1 4 6 19   Difficult doing work/chores - - - - Very difficult  Some recent data might be hidden     ALLERGIES: Allergies  Allergen Reactions  . No Known Allergies     MEDICATIONS: Current Outpatient Medications on File Prior to Visit  Medication Sig Dispense Refill  . acetaminophen (TYLENOL) 500 MG tablet Take 1,000 mg by mouth 2 (two) times daily as needed for moderate pain or headache.    . albuterol (PROVENTIL HFA;VENTOLIN HFA) 108 (90 Base) MCG/ACT inhaler Inhale 2 puffs into the lungs every 6 (six) hours as needed for wheezing or shortness of breath. 1 Inhaler 12  . anastrozole (ARIMIDEX) 1 MG tablet TAKE 1 TABLET (1 MG TOTAL) BY MOUTH DAILY. 90 tablet 3  . aspirin EC 81 MG tablet Take 81 mg by mouth at bedtime.     Marland Kitchen BREO ELLIPTA 100-25 MCG/INH AEPB INHALE 1 PUFF INTO LUNGS ONCE A DAY 60 each 0  . BYSTOLIC 10 MG tablet TAKE 1 TABLET (10 MG TOTAL) BY MOUTH AT BEDTIME. 90 tablet 2  . diclofenac (VOLTAREN) 75 MG EC tablet  TAKE 1 TABLET TWICE DAILY 180 tablet 1  . diltiazem (CARDIZEM CD) 120 MG 24 hr capsule TAKE 1 CAPSULE (120 MG TOTAL) BY MOUTH DAILY. 90 capsule 2  . escitalopram (LEXAPRO) 20 MG tablet TAKE 1 TABLET (20 MG TOTAL) BY MOUTH DAILY. 90 tablet 1  . esomeprazole (NEXIUM) 40 MG capsule Take 1 capsule (40 mg total) by mouth daily. 90 capsule 2  . ezetimibe (ZETIA) 10 MG tablet TAKE 1 TABLET EVERY DAY 90 tablet 3  . famotidine (PEPCID) 20 MG tablet Take 1 tablet (20 mg total) by mouth 2 (two) times daily as needed for heartburn or indigestion. 60 tablet 3  . fluticasone (FLONASE) 50 MCG/ACT nasal spray Place 2 sprays into both nostrils daily. 16 g 6  . furosemide (LASIX) 20 MG tablet TAKE 1 TABLET (20 MG  TOTAL) BY MOUTH DAILY. 90 tablet 2  . Hypromellose (ARTIFICIAL TEARS OP) Place 1 drop into both eyes daily as needed (dry eyes).     Marland Kitchen loratadine (CLARITIN) 10 MG tablet Take 1 tablet (10 mg total) by mouth daily. 30 tablet 11  . losartan (COZAAR) 100 MG tablet TAKE 1 TABLET EVERY DAY 90 tablet 2  . metFORMIN (GLUCOPHAGE) 500 MG tablet Take 1 tablet (500 mg total) by mouth 2 (two) times daily with a meal. 180 tablet 3  . montelukast (SINGULAIR) 10 MG tablet TAKE 1 TABLET (10 MG TOTAL) BY MOUTH AT BEDTIME. 90 tablet 2  . Probiotic Product (PROBIOTIC PO) Take 1 capsule by mouth at bedtime.    . rosuvastatin (CRESTOR) 40 MG tablet TAKE 1 TABLET EVERY DAY 90 tablet 3   No current facility-administered medications on file prior to visit.     PAST MEDICAL HISTORY: Past Medical History:  Diagnosis Date  . Anemia 04/05/2012  . Anxiety   . Anxiety state 09/11/2007   Qualifier: Diagnosis of  By: Scherrie Gerlach    . Asthma   . Atrial tachycardia, paroxysmal ([place])   . Back pain 10/02/2014  . Breast cancer ([place]) 02/17/2017  . Chicken pox as a child  . Chronic diastolic CHF (congestive heart failure), NYHA class 1 ([place])   . Complication of anesthesia    pt states feels different in her body after anesthesia when waking up and also experiences a smell of burnt plastic for approx a wk   . Constipation   . Depression   . Diabetes ([place])   . Dilated aortic root ([place])    67mm by echo 09/2016  . Dysuria 02/17/2017  . Elevated LFTs 04/05/2012  . GERD (gastroesophageal reflux disease)   . Goiter   . Heart murmur    hx of one at birth   . History of kidney stones   . History of radiation therapy 10/31/16-12/17/16   left breast 45 Gy in 25 fractions, left breast boost 16 Gy in 8 fractions  . Hx of colonic polyps   . Hyperlipidemia   . Hypertension   . Insomnia 10/08/2016  . Joint pain   . Malignant neoplasm of upper-outer quadrant of left female breast ([place]) 05/14/2016   not with patient  . Measles  as a child  . Mumps as a child  . OA (osteoarthritis) of knee 04/05/2012  . Obesity 07/11/2014  . PONV (postoperative nausea and vomiting)   . Preventative health care 09/05/2013  . PVC's (premature ventricular contractions) 05/02/2016  . Shortness of breath dyspnea    walking distances / climbing stairs  . Sleep apnea 04/05/2012  .  Tinea corporis 02/23/2013  . Type 2 diabetes mellitus with hyperglycemia ([place]) 12/31/2013  . Vasomotor rhinitis 04/05/2012  . Ventral hernia   . Wheezing     PAST SURGICAL HISTORY: Past Surgical History:  Procedure Laterality Date  . BREAST LUMPECTOMY Left 2017  . BREAST LUMPECTOMY WITH RADIOACTIVE SEED AND SENTINEL LYMPH NODE BIOPSY Left 06/21/2016   Procedure: LEFT BREAST LUMPECTOMY WITH RADIOACTIVE SEED AND SENTINEL LYMPH NODE BIOPSY, INJECT BLUE DYE LEFT BREAST;  Surgeon: Fanny Skates, MD;  Location: [facility];  Service: General;  Laterality: Left;  . CARDIAC CATHETERIZATION     normal coroary arteries per patient  . CARDIOVASCULAR STRESS TEST     10/12/2013  . CESAREAN SECTION     X 3  . CHOLECYSTECTOMY    . COLONOSCOPY WITH PROPOFOL N/A 03/28/2015   Procedure: COLONOSCOPY WITH PROPOFOL;  Surgeon: Juanita Craver, MD;  Location: [facility];  Service: Endoscopy;  Laterality: N/A;  . HERNIA REPAIR  02/08/11   ventral hernia  . JOINT REPLACEMENT     bilateral  . KNEE ARTHROSCOPY  05/2010   bilateral  . LEFT AND RIGHT HEART CATHETERIZATION WITH CORONARY ANGIOGRAM N/A 11/08/2013   Procedure: LEFT AND RIGHT HEART CATHETERIZATION WITH CORONARY ANGIOGRAM;  Surgeon: [name], MD;  Location: [facility] CATH LAB;  Service: Cardiovascular;  Laterality: N/A;  . MENISCUS REPAIR  2009  . MOUTH SURGERY     teeth implants  . PORT-A-CATH REMOVAL N/A 01/24/2017   Procedure: REMOVAL PORT-A-CATH;  Surgeon: Fanny Skates, MD;  Location: [facility];  Service: General;  Laterality: N/A;  . PORTACATH PLACEMENT Right 07/16/2016   Procedure: INSERTION PORT-A-CATH RIGHT INTERNAL  JUGULAR WITH ULTRASOUND;  Surgeon: Fanny Skates, MD;  Location: [facility];  Service: General;  Laterality: Right;  . TOTAL KNEE ARTHROPLASTY  2011   left  . TOTAL KNEE ARTHROPLASTY Right 05/10/2014   Procedure: RIGHT TOTAL KNEE ARTHROPLASTY;  Surgeon: Mauri Pole, MD;  Location: [facility];  Service: Orthopedics;  Laterality: Right;  . TUBAL LIGATION    . WISDOM TOOTH EXTRACTION  2000    SOCIAL HISTORY: Social History   Tobacco Use  . Smoking status: Never Smoker  . Smokeless tobacco: Never Used  Substance Use Topics  . Alcohol use: Yes    Comment: rarely  . Drug use: No    FAMILY HISTORY: Family History  Problem Relation Age of Onset  . Thyroid disease Mother   . Hyperlipidemia Mother   . Depression Mother   . Anxiety disorder Mother   . Heart attack Father 19  . Hypertension Father   . Arthritis Father        RA  . Coronary artery disease Father   . High Cholesterol Father   . Coronary artery disease Brother   . Heart disease Brother   . Cancer Maternal Aunt        colon  . Cancer Maternal Grandmother        colon  . Heart attack Maternal Grandfather   . Alcohol abuse Paternal Grandfather     ROS: Review of Systems  Constitutional: Positive for weight loss.  Gastrointestinal: Negative for nausea and vomiting.  Musculoskeletal:       Negative for muscle weakness  Endo/Heme/Allergies:       Positive for cravings Negative for hypoglycemia  Psychiatric/Behavioral: Positive for depression. Negative for suicidal ideas.    PHYSICAL EXAM: Blood pressure 135/80, pulse (!) 58, temperature 97.8 F (36.6 C), temperature source Oral, height 5\' 5"  (1.651 m),  weight 300 lb (136.1 kg), SpO2 98 %. Body mass index is 49.92 kg/m. Physical Exam  Constitutional: She is oriented to person, place, and time. She appears well-developed and well-nourished.  Cardiovascular: Normal rate.  Pulmonary/Chest: Effort normal.  Musculoskeletal: Normal range of motion.  Neurological: She  is oriented to person, place, and time.  Skin: Skin is warm and dry.  Psychiatric: She has a normal mood and affect. Her behavior is normal. She expresses no homicidal and no suicidal ideation.  Vitals reviewed.   RECENT LABS AND TESTS: BMET    Component Value Date/Time   NA 140 03/26/2018 1024   NA 145 04/11/2017 0942   K 4.6 03/26/2018 1024   K 4.4 04/11/2017 0942   CL 98 03/26/2018 1024   CO2 21 03/26/2018 1024   CO2 26 04/11/2017 0942   GLUCOSE 225 (H) 03/26/2018 1024   GLUCOSE 221 (H) 12/22/2017 0907   GLUCOSE 156 (H) 04/11/2017 0942   BUN 14 03/26/2018 1024   BUN 15.1 04/11/2017 0942   CREATININE 0.94 03/26/2018 1024   CREATININE 0.87 10/06/2017 1010   CREATININE 0.8 04/11/2017 0942   CALCIUM 9.9 03/26/2018 1024   CALCIUM 10.0 04/11/2017 0942   GFRNONAA 63 03/26/2018 1024   GFRNONAA >60 10/06/2017 1010   GFRAA 73 03/26/2018 1024   GFRAA >60 10/06/2017 1010   Lab Results  Component Value Date   HGBA1C 9.5 (H) 03/26/2018   HGBA1C 8.7 (H) 12/22/2017   HGBA1C 7.5 (H) 09/22/2017   HGBA1C 7.2 (H) 05/22/2017   HGBA1C 7.3 (H) 02/17/2017   Lab Results  Component Value Date   INSULIN 57.0 (H) 03/26/2018   CBC    Component Value Date/Time   WBC 9.0 03/26/2018 1024   WBC 7.5 12/22/2017 0907   RBC 4.95 03/26/2018 1024   RBC 4.87 12/22/2017 0907   HGB 12.4 03/26/2018 1024   HGB 12.2 04/11/2017 0942   HCT 40.3 03/26/2018 1024   HCT 39.1 04/11/2017 0942   PLT 282.0 12/22/2017 0907   PLT 274 10/06/2017 1010   PLT 240 04/11/2017 0942   MCV 81 03/26/2018 1024   MCV 81.6 04/11/2017 0942   MCH 25.1 (L) 03/26/2018 1024   MCH 25.6 10/06/2017 1010   MCHC 30.8 (L) 03/26/2018 1024   MCHC 31.1 12/22/2017 0907   RDW 17.1 (H) 03/26/2018 1024   RDW 19.2 (H) 04/11/2017 0942   LYMPHSABS 1.5 03/26/2018 1024   LYMPHSABS 1.4 04/11/2017 0942   MONOABS 0.5 12/22/2017 0907   MONOABS 0.3 04/11/2017 0942   EOSABS 0.0 03/26/2018 1024   BASOSABS 0.0 03/26/2018 1024   BASOSABS 0.0  04/11/2017 0942   Iron/TIBC/Ferritin/ %Sat No results found for: IRON, TIBC, FERRITIN, IRONPCTSAT Lipid Panel     Component Value Date/Time   CHOL 126 03/26/2018 1024   TRIG 188 (H) 03/26/2018 1024   HDL 34 (L) 03/26/2018 1024   CHOLHDL 3 12/22/2017 0907   VLDL 33.2 12/22/2017 0907   LDLCALC 54 03/26/2018 1024   LDLDIRECT 108.0 10/08/2016 0849   Hepatic Function Panel     Component Value Date/Time   PROT 7.1 03/26/2018 1024   PROT 7.5 04/11/2017 0942   ALBUMIN 4.0 03/26/2018 1024   ALBUMIN 3.6 04/11/2017 0942   AST 65 (H) 03/26/2018 1024   AST 33 10/06/2017 1010   AST 41 (H) 04/11/2017 0942   ALT 44 (H) 03/26/2018 1024   ALT 30 10/06/2017 1010   ALT 40 04/11/2017 0942   ALKPHOS 121 (H) 03/26/2018 1024  ALKPHOS 93 04/11/2017 0942   BILITOT 0.4 03/26/2018 1024   BILITOT 0.5 10/06/2017 1010   BILITOT 0.48 04/11/2017 0942   BILIDIR 0.15 06/19/2017 0950   IBILI 0.3 05/24/2016 0828      Component Value Date/Time   TSH 2.130 03/26/2018 1024   TSH 1.87 12/22/2017 0907   TSH 0.81 09/22/2017 0850    Ref. Range 03/26/2018 10:24  Vitamin D, 25-Hydroxy Latest Ref Range: 30.0 - 100.0 ng/mL 9.1 (L)   ASSESSMENT AND PLAN: Vitamin D deficiency - Plan: Vitamin D, Ergocalciferol, (DRISDOL) 1.25 MG (50000 UT) CAPS capsule  Type 2 diabetes mellitus without complication, without long-term current use of insulin (HCC)  Other depression - with emotional eating  At risk for osteoporosis  Class 3 severe obesity with serious comorbidity and body mass index (BMI) of 50.0 to 59.9 in adult, unspecified obesity type ([place])  Type 2 diabetes mellitus with hyperglycemia, without long-term current use of insulin (HCC) - Plan: DISCONTINUED: Semaglutide,0.25 or 0.5MG /DOS, (OZEMPIC, 0.25 OR 0.5 MG/DOSE,) 2 MG/1.5ML SOPN  PLAN:  Vitamin D Deficiency [name] was informed that low vitamin D levels contributes to fatigue and are associated with obesity, breast, and colon cancer. She agrees to  continue to take prescription Vit D @50 ,000 IU every week #4 with no refills and will follow up for routine testing of vitamin D, at least 2-3 times per year. She was informed of the risk of over-replacement of vitamin D and agrees to not increase her dose unless she discusses this with [name] first. [name] agrees to follow up with our clinic in 2 weeks.  At risk for osteopenia and osteoporosis [name] was given extended  (15 minutes) osteoporosis prevention counseling today. [name] is at risk for osteopenia and osteoporosis due to her vitamin D deficiency. She was encouraged to take her vitamin D and follow her higher calcium diet and increase strengthening exercise to help strengthen her bones and decrease her risk of osteopenia and osteoporosis.  Diabetes II [name] has been given extensive diabetes education by myself today including ideal fasting and post-prandial blood glucose readings, individual ideal Hgb A1c goals and hypoglycemia prevention. We discussed the importance of good blood sugar control to decrease the likelihood of diabetic complications such as nephropathy, neuropathy, limb loss, blindness, coronary artery disease, and death. We discussed the importance of intensive lifestyle modification including diet, exercise and weight loss as the first line treatment for diabetes. [name] agrees to continue Semaglutide (0.25 or 0.5 mg) 0.5 mg once a week #one month supply with no refills and follow up at the agreed upon time.  Depression with Emotional Eating Behaviors We discussed behavior modification techniques today to help [name] deal with her emotional eating and depression. She has agreed to follow up with Dr. Mallie Mussel in 4 weeks..  Obesity [name] is currently in the action stage of change. As such, her goal is to continue with weight loss efforts She has agreed to follow the Category [place] has been instructed to work up to a goal of 150 minutes of combined cardio and  strengthening exercise per week for weight loss and overall health benefits. We discussed the following Behavioral Modification Strategies today: no skipping meals, increase H2O intake, more meal planning, increasing lean protein intake, decreasing simple carbohydrates , increasing vegetables and decrease eating out  [name] has agreed to follow up with our clinic in 2 weeks. She was informed of the importance of frequent follow up visits to maximize her success with intensive lifestyle modifications for  her multiple health conditions.   OBESITY BEHAVIORAL INTERVENTION VISIT  Today's visit was # 4   Starting weight: 323 lbs Starting date: 03/26/2018 Today's weight : 300 lbs Today's date: 05/27/2018 Total lbs lost to date: 55   ASK: We discussed the diagnosis of obesity with [name] today and [name] agreed to give [name] permission to discuss obesity behavioral modification therapy today.  ASSESS: [name] has the diagnosis of obesity and her BMI today is 49.92 [name] is in the action stage of change   ADVISE: Lennyx was educated on the multiple health risks of obesity as well as the benefit of weight loss to improve her health. She was advised of the need for long term treatment and the importance of lifestyle modifications to improve her current health and to decrease her risk of future health problems.  AGREE: Multiple dietary modification options and treatment options were discussed and  [name] agreed to follow the recommendations documented in the above note.  ARRANGE: Sharmane was educated on the importance of frequent visits to treat obesity as outlined per CMS and USPSTF guidelines and agreed to schedule her next follow up appointment today.  Corey Skains, am acting as Location manager for [employer]. Owens Shark, DO  I have reviewed the above documentation for accuracy and completeness, and I agree with the above. -Jearld Lesch, DO

## 2018-06-01 NOTE — Telephone Encounter (Signed)
Patient told Stew at the pharmacy that Dr. Owens Shark changed instructions on her Ozempic.  Please confirm & advice. Thank you

## 2018-06-01 NOTE — Telephone Encounter (Signed)
I did increase from 0.25 mg to 0.50 mg. I sent a new prescription. Can you or someone call her and tell her that it was resent ?

## 2018-06-01 NOTE — Telephone Encounter (Signed)
Spoke with the patient and informed her of the new prescription. Patient verbalized understanding and also asked for a Rx for test strips (sent in to [facility] per her request). Lamon Rotundo, [place]

## 2018-06-02 ENCOUNTER — Telehealth (INDEPENDENT_AMBULATORY_CARE_PROVIDER_SITE_OTHER): Payer: Self-pay | Admitting: Bariatrics

## 2018-06-02 ENCOUNTER — Other Ambulatory Visit (INDEPENDENT_AMBULATORY_CARE_PROVIDER_SITE_OTHER): Payer: Self-pay

## 2018-06-02 ENCOUNTER — Telehealth: Payer: Self-pay

## 2018-06-02 DIAGNOSIS — E119 Type 2 diabetes mellitus without complications: Secondary | ICD-10-CM

## 2018-06-02 DIAGNOSIS — E1165 Type 2 diabetes mellitus with hyperglycemia: Secondary | ICD-10-CM

## 2018-06-02 MED ORDER — SEMAGLUTIDE(0.25 OR 0.5MG/DOS) 2 MG/1.5ML ~~LOC~~ SOPN: 0.5000 mg | PEN_INJECTOR | SUBCUTANEOUS | 0 refills | Status: DC

## 2018-06-02 MED ORDER — GLUCOSE BLOOD VI STRP: ORAL_STRIP | 5 refills | Status: DC

## 2018-06-02 NOTE — Telephone Encounter (Signed)
Pt states ozempic 50 mg has not been called in (dose was increased from 25 mg). Please call in to [facility] and [email or web address]

## 2018-06-02 NOTE — Telephone Encounter (Signed)
The medication was sent to the patients mail order pharmacy. Will send to [facility] per Dr Owens Shark. April, [place]

## 2018-06-02 NOTE — Telephone Encounter (Signed)
Copied from [place] [phone]. Topic: General - Other >> Jun 01, 2018  8:59 AM Janace Aris A wrote: Reason for CRM: [facility] called in wanting to request test strips be sent to the pharmacy, says the pt's old pharmacy is no longer open. I don't see this on med list.

## 2018-06-10 ENCOUNTER — Encounter (INDEPENDENT_AMBULATORY_CARE_PROVIDER_SITE_OTHER): Payer: Self-pay | Admitting: Bariatrics

## 2018-06-10 ENCOUNTER — Ambulatory Visit (INDEPENDENT_AMBULATORY_CARE_PROVIDER_SITE_OTHER): Payer: Medicare HMO | Admitting: Bariatrics

## 2018-06-10 VITALS — BP 142/84 | HR 72 | Temp 97.6°F | Ht 65.0 in | Wt 293.0 lb

## 2018-06-10 DIAGNOSIS — Z6841 Body Mass Index (BMI) 40.0 and over, adult: Secondary | ICD-10-CM

## 2018-06-10 DIAGNOSIS — F3289 Other specified depressive episodes: Secondary | ICD-10-CM | POA: Diagnosis not present

## 2018-06-10 DIAGNOSIS — K219 Gastro-esophageal reflux disease without esophagitis: Secondary | ICD-10-CM

## 2018-06-10 DIAGNOSIS — E66813 Obesity, class 3: Secondary | ICD-10-CM

## 2018-06-10 DIAGNOSIS — E119 Type 2 diabetes mellitus without complications: Secondary | ICD-10-CM | POA: Diagnosis not present

## 2018-06-10 DIAGNOSIS — E7849 Other hyperlipidemia: Secondary | ICD-10-CM | POA: Diagnosis not present

## 2018-06-10 NOTE — Progress Notes (Signed)
Office: [phone]  /  Fax: [phone]   HPI:   Chief Complaint: OBESITY [name] is here to discuss her progress with her obesity treatment plan. She is on the Category 4 plan and is following her eating plan approximately 90 % of the time. She states she is walking 10 minutes 3 times per week. [name] is getting tired of just meat and vegetables and she wants to add some potatoes and bananas. She needs more variety. Her weight is 293 lb (132.9 kg) today and has had a weight loss of 7 pounds over a period of 2 weeks since her last visit. She has lost 30 lbs since starting treatment with [name].  Diabetes II [name] has a diagnosis of diabetes type II. She is taking Ozempic and Metformin. [name] states fasting BGs range between 130 and 140's and 2 hour post prandial BGs range in the 140's. [name] denies any hypoglycemic episodes. Last A1c was at 9.5 She has been working on intensive lifestyle modifications including diet, exercise, and weight loss to help control her blood glucose levels.  Hyperlipidemia [name] has hyperlipidemia and she is taking Zetia. She has been trying to improve her cholesterol levels with intensive lifestyle modification including a low saturated fat diet, exercise and weight loss. She denies any chest pain, claudication or myalgias.  GERD (gastroesophageal reflux disease) [name] has GERD and she is currently taking Nexium and Pepcid. She is also taking probiotic.  Depression with emotional eating behaviors [name] is struggling with emotional eating and using food for comfort to the extent that it is negatively impacting her health. She often snacks when she is not hungry. [name] sometimes feels she is out of control and then feels guilty that she made poor food choices. She has been working on behavior modification techniques to help reduce her emotional eating and has been somewhat successful. She is taking [name] per her PCP. She shows no sign of suicidal or  homicidal ideations.  Depression screen [name] 2/9 05/27/2018 05/07/2018 04/22/2018 04/08/2018 03/26/2018  Decreased Interest 0 0 1 1 2   Down, Depressed, Hopeless 0 0 1 1 2   PHQ - 2 Score 0 0 2 2 4   Altered sleeping 0 1 0 1 3  Tired, decreased energy 0 0 1 1 3   Change in appetite 0 0 1 1 3   Feeling bad or failure about yourself  0 0 0 1 3  Trouble concentrating 0 0 0 0 1  Moving slowly or fidgety/restless 0 0 0 0 0  Suicidal thoughts 0 0 0 0 2  PHQ-9 Score 0 1 4 6 19   Difficult doing work/chores - - - - Very difficult  Some recent data might be hidden     ASSESSMENT AND PLAN:  Type 2 diabetes mellitus without complication, without long-term current use of insulin (HCC)  Other hyperlipidemia  Gastroesophageal reflux disease, esophagitis presence not specified  Other depression - with emotional eating  Class 3 severe obesity with serious comorbidity and body mass index (BMI) of 45.0 to 49.9 in adult, unspecified obesity type (HCC)  PLAN:  Diabetes II [name] has been given extensive diabetes education by myself today including ideal fasting and post-prandial blood glucose readings, individual ideal Hgb A1c goals and hypoglycemia prevention. We discussed the importance of good blood sugar control to decrease the likelihood of diabetic complications such as nephropathy, neuropathy, limb loss, blindness, coronary artery disease, and death. We discussed the importance of intensive lifestyle modification including diet, exercise and weight loss as the first  line treatment for diabetes. Zennie will continue Ozempic 0.5 mg and Metformin and will follow up at the agreed upon time.  Hyperlipidemia [name] was informed of the American Heart Association Guidelines emphasizing intensive lifestyle modifications as the first line treatment for hyperlipidemia. We discussed many lifestyle modifications today in depth, and [name] will continue to work on decreasing saturated fats such as fatty red  meat, butter and many fried foods. She will also increase vegetables and lean protein in her diet and continue to work on exercise and weight loss efforts. [name] will continue Zetia and follow up at the agreed upon time.  GERD (gastroesophageal reflux disease) [name] will take OTC Pepcid and will restart Zantac that she has a prescription for. [name] will continue taking probiotics.  Depression with Emotional Eating Behaviors We discussed behavior modification techniques today to help [name] deal with her emotional eating and depression. She will continue her medications as prescribed and follow up at the agreed upon time.  Obesity [name] is currently in the action stage of change. As such, her goal is to continue with weight loss efforts She has agreed to keep a food journal with 350 to 450 calories and 30+ grams of protein at breakfast daily and follow the Category [name] has been instructed to work up to a goal of 150 minutes of combined cardio and strengthening exercise per week for weight loss and overall health benefits. We discussed the following Behavioral Modification Strategies today: increase H2O intake, increasing lean protein intake, decreasing simple carbohydrates, increasing vegetables and decrease liquid calories  We discussed more options and variety today.  [name] has agreed to follow up with our clinic in 2 weeks. She was informed of the importance of frequent follow up visits to maximize her success with intensive lifestyle modifications for her multiple health conditions.  ALLERGIES: Allergies  Allergen Reactions  . No Known Allergies     MEDICATIONS: Current Outpatient Medications on File Prior to Visit  Medication Sig Dispense Refill  . acetaminophen (TYLENOL) 500 MG tablet Take 1,000 mg by mouth 2 (two) times daily as needed for moderate pain or headache.    . albuterol (PROVENTIL HFA;VENTOLIN HFA) 108 (90 Base) MCG/ACT inhaler Inhale 2 puffs into  the lungs every 6 (six) hours as needed for wheezing or shortness of breath. 1 Inhaler 12  . anastrozole (ARIMIDEX) 1 MG tablet TAKE 1 TABLET (1 MG TOTAL) BY MOUTH DAILY. 90 tablet 3  . aspirin EC 81 MG tablet Take 81 mg by mouth at bedtime.     Marland Kitchen BREO ELLIPTA 100-25 MCG/INH AEPB INHALE 1 PUFF INTO LUNGS ONCE A DAY 60 each 0  . BYSTOLIC 10 MG tablet TAKE 1 TABLET (10 MG TOTAL) BY MOUTH AT BEDTIME. 90 tablet 2  . diclofenac (VOLTAREN) 75 MG EC tablet TAKE 1 TABLET TWICE DAILY 180 tablet 1  . diltiazem (CARDIZEM CD) 120 MG 24 hr capsule TAKE 1 CAPSULE (120 MG TOTAL) BY MOUTH DAILY. 90 capsule 2  . escitalopram ([name]) 20 MG tablet TAKE 1 TABLET (20 MG TOTAL) BY MOUTH DAILY. 90 tablet 1  . esomeprazole (NEXIUM) 40 MG capsule Take 1 capsule (40 mg total) by mouth daily. 90 capsule 2  . ezetimibe (ZETIA) 10 MG tablet TAKE 1 TABLET EVERY DAY 90 tablet 3  . famotidine (PEPCID) 20 MG tablet Take 1 tablet (20 mg total) by mouth 2 (two) times daily as needed for heartburn or indigestion. 60 tablet 3  . fluticasone (FLONASE) 50 MCG/ACT nasal spray  Place 2 sprays into both nostrils daily. 16 g 6  . furosemide (LASIX) 20 MG tablet TAKE 1 TABLET (20 MG TOTAL) BY MOUTH DAILY. 90 tablet 2  . glucose blood (ACCU-CHEK AVIVA) test strip Twice daily (please use brand that patient insurance covers) 100 each 5  . Hypromellose (ARTIFICIAL TEARS OP) Place 1 drop into both eyes daily as needed (dry eyes).     Marland Kitchen loratadine (CLARITIN) 10 MG tablet Take 1 tablet (10 mg total) by mouth daily. 30 tablet 11  . losartan (COZAAR) 100 MG tablet TAKE 1 TABLET EVERY DAY 90 tablet 2  . metFORMIN (GLUCOPHAGE) 500 MG tablet Take 1 tablet (500 mg total) by mouth 2 (two) times daily with a meal. 180 tablet 3  . montelukast (SINGULAIR) 10 MG tablet TAKE 1 TABLET (10 MG TOTAL) BY MOUTH AT BEDTIME. 90 tablet 2  . Probiotic Product (PROBIOTIC PO) Take 1 capsule by mouth at bedtime.    . rosuvastatin (CRESTOR) 40 MG tablet TAKE 1 TABLET  EVERY DAY 90 tablet 3  . Semaglutide,0.25 or 0.5MG /DOS, (OZEMPIC, 0.25 OR 0.5 MG/DOSE,) 2 MG/1.5ML SOPN Inject 0.5 mg into the skin once a week. 4 pen 0  . Vitamin D, Ergocalciferol, (DRISDOL) 1.25 MG (50000 UT) CAPS capsule Take 1 capsule (50,000 Units total) by mouth every 7 (seven) days. 4 capsule 0   No current facility-administered medications on file prior to visit.     PAST MEDICAL HISTORY: Past Medical History:  Diagnosis Date  . Anemia 04/05/2012  . Anxiety   . Anxiety state 09/11/2007   Qualifier: Diagnosis of  By: Scherrie Gerlach    . Asthma   . Atrial tachycardia, paroxysmal ([place])   . Back pain 10/02/2014  . Breast cancer ([place]) 02/17/2017  . Chicken pox as a child  . Chronic diastolic CHF (congestive heart failure), NYHA class 1 ([place])   . Complication of anesthesia    pt states feels different in her body after anesthesia when waking up and also experiences a smell of burnt plastic for approx a wk   . Constipation   . Depression   . Diabetes ([place])   . Dilated aortic root ([place])    47mm by echo 09/2016  . Dysuria 02/17/2017  . Elevated LFTs 04/05/2012  . GERD (gastroesophageal reflux disease)   . Goiter   . Heart murmur    hx of one at birth   . History of kidney stones   . History of radiation therapy 10/31/16-12/17/16   left breast 45 Gy in 25 fractions, left breast boost 16 Gy in 8 fractions  . Hx of colonic polyps   . Hyperlipidemia   . Hypertension   . Insomnia 10/08/2016  . Joint pain   . Malignant neoplasm of upper-outer quadrant of left female breast ([place]) 05/14/2016   not with patient  . Measles as a child  . Mumps as a child  . OA (osteoarthritis) of knee 04/05/2012  . Obesity 07/11/2014  . PONV (postoperative nausea and vomiting)   . Preventative health care 09/05/2013  . PVC's (premature ventricular contractions) 05/02/2016  . Shortness of breath dyspnea    walking distances / climbing stairs  . Sleep apnea 04/05/2012  . Tinea corporis 02/23/2013  . Type  2 diabetes mellitus with hyperglycemia ([place]) 12/31/2013  . Vasomotor rhinitis 04/05/2012  . Ventral hernia   . Wheezing     PAST SURGICAL HISTORY: Past Surgical History:  Procedure Laterality Date  . BREAST LUMPECTOMY Left 2017  .  BREAST LUMPECTOMY WITH RADIOACTIVE SEED AND SENTINEL LYMPH NODE BIOPSY Left 06/21/2016   Procedure: LEFT BREAST LUMPECTOMY WITH RADIOACTIVE SEED AND SENTINEL LYMPH NODE BIOPSY, INJECT BLUE DYE LEFT BREAST;  Surgeon: Fanny Skates, MD;  Location: [facility];  Service: General;  Laterality: Left;  . CARDIAC CATHETERIZATION     normal coroary arteries per patient  . CARDIOVASCULAR STRESS TEST     10/12/2013  . CESAREAN SECTION     X 3  . CHOLECYSTECTOMY    . COLONOSCOPY WITH PROPOFOL N/A 03/28/2015   Procedure: COLONOSCOPY WITH PROPOFOL;  Surgeon: Juanita Craver, MD;  Location: [facility];  Service: Endoscopy;  Laterality: N/A;  . HERNIA REPAIR  02/08/11   ventral hernia  . JOINT REPLACEMENT     bilateral  . KNEE ARTHROSCOPY  05/2010   bilateral  . LEFT AND RIGHT HEART CATHETERIZATION WITH CORONARY ANGIOGRAM N/A 11/08/2013   Procedure: LEFT AND RIGHT HEART CATHETERIZATION WITH CORONARY ANGIOGRAM;  Surgeon: [name], MD;  Location: [facility] CATH LAB;  Service: Cardiovascular;  Laterality: N/A;  . MENISCUS REPAIR  2009  . MOUTH SURGERY     teeth implants  . PORT-A-CATH REMOVAL N/A 01/24/2017   Procedure: REMOVAL PORT-A-CATH;  Surgeon: Fanny Skates, MD;  Location: [facility];  Service: General;  Laterality: N/A;  . PORTACATH PLACEMENT Right 07/16/2016   Procedure: INSERTION PORT-A-CATH RIGHT INTERNAL JUGULAR WITH ULTRASOUND;  Surgeon: Fanny Skates, MD;  Location: [facility];  Service: General;  Laterality: Right;  . TOTAL KNEE ARTHROPLASTY  2011   left  . TOTAL KNEE ARTHROPLASTY Right 05/10/2014   Procedure: RIGHT TOTAL KNEE ARTHROPLASTY;  Surgeon: Mauri Pole, MD;  Location: [facility];  Service: Orthopedics;  Laterality: Right;  . TUBAL LIGATION    . WISDOM TOOTH  EXTRACTION  2000    SOCIAL HISTORY: Social History   Tobacco Use  . Smoking status: Never Smoker  . Smokeless tobacco: Never Used  Substance Use Topics  . Alcohol use: Yes    Comment: rarely  . Drug use: No    FAMILY HISTORY: Family History  Problem Relation Age of Onset  . Thyroid disease Mother   . Hyperlipidemia Mother   . Depression Mother   . Anxiety disorder Mother   . Heart attack Father 60  . Hypertension Father   . Arthritis Father        RA  . Coronary artery disease Father   . High Cholesterol Father   . Coronary artery disease Brother   . Heart disease Brother   . Cancer Maternal Aunt        colon  . Cancer Maternal Grandmother        colon  . Heart attack Maternal Grandfather   . Alcohol abuse Paternal Grandfather     ROS: Review of Systems  Constitutional: Positive for weight loss.  Endo/Heme/Allergies:       Negative for hypoglycemia  Psychiatric/Behavioral: Positive for depression. Negative for suicidal ideas.    PHYSICAL EXAM: Blood pressure (!) 142/84, pulse 72, temperature 97.6 F (36.4 C), temperature source Oral, height 5\' 5"  (1.651 m), weight 293 lb (132.9 kg), SpO2 98 %. Body mass index is 48.76 kg/m. Physical Exam Vitals signs reviewed.  Constitutional:      Appearance: Normal appearance. She is well-developed. She is obese.  Cardiovascular:     Rate and Rhythm: Normal rate.  Pulmonary:     Effort: Pulmonary effort is normal.  Musculoskeletal: Normal range of motion.  Skin:    General: Skin  is warm and dry.  Neurological:     Mental Status: She is alert and oriented to person, place, and time.  Psychiatric:        Mood and Affect: Mood normal.        Behavior: Behavior normal.        Thought Content: Thought content does not include homicidal or suicidal ideation.     RECENT LABS AND TESTS: BMET    Component Value Date/Time   NA 140 03/26/2018 1024   NA 145 04/11/2017 0942   K 4.6 03/26/2018 1024   K 4.4 04/11/2017  0942   CL 98 03/26/2018 1024   CO2 21 03/26/2018 1024   CO2 26 04/11/2017 0942   GLUCOSE 225 (H) 03/26/2018 1024   GLUCOSE 221 (H) 12/22/2017 0907   GLUCOSE 156 (H) 04/11/2017 0942   BUN 14 03/26/2018 1024   BUN 15.1 04/11/2017 0942   CREATININE 0.94 03/26/2018 1024   CREATININE 0.87 10/06/2017 1010   CREATININE 0.8 04/11/2017 0942   CALCIUM 9.9 03/26/2018 1024   CALCIUM 10.0 04/11/2017 0942   GFRNONAA 63 03/26/2018 1024   GFRNONAA >60 10/06/2017 1010   GFRAA 73 03/26/2018 1024   GFRAA >60 10/06/2017 1010   Lab Results  Component Value Date   HGBA1C 9.5 (H) 03/26/2018   HGBA1C 8.7 (H) 12/22/2017   HGBA1C 7.5 (H) 09/22/2017   HGBA1C 7.2 (H) 05/22/2017   HGBA1C 7.3 (H) 02/17/2017   Lab Results  Component Value Date   INSULIN 57.0 (H) 03/26/2018   CBC    Component Value Date/Time   WBC 9.0 03/26/2018 1024   WBC 7.5 12/22/2017 0907   RBC 4.95 03/26/2018 1024   RBC 4.87 12/22/2017 0907   HGB 12.4 03/26/2018 1024   HGB 12.2 04/11/2017 0942   HCT 40.3 03/26/2018 1024   HCT 39.1 04/11/2017 0942   PLT 282.0 12/22/2017 0907   PLT 274 10/06/2017 1010   PLT 240 04/11/2017 0942   MCV 81 03/26/2018 1024   MCV 81.6 04/11/2017 0942   MCH 25.1 ([name]) 03/26/2018 1024   MCH 25.6 10/06/2017 1010   MCHC 30.8 ([name]) 03/26/2018 1024   MCHC 31.1 12/22/2017 0907   RDW 17.1 (H) 03/26/2018 1024   RDW 19.2 (H) 04/11/2017 0942   LYMPHSABS 1.5 03/26/2018 1024   LYMPHSABS 1.4 04/11/2017 0942   MONOABS 0.5 12/22/2017 0907   MONOABS 0.3 04/11/2017 0942   EOSABS 0.0 03/26/2018 1024   BASOSABS 0.0 03/26/2018 1024   BASOSABS 0.0 04/11/2017 0942   Iron/TIBC/Ferritin/ %Sat No results found for: IRON, TIBC, FERRITIN, IRONPCTSAT Lipid Panel     Component Value Date/Time   CHOL 126 03/26/2018 1024   TRIG 188 (H) 03/26/2018 1024   HDL 34 ([name]) 03/26/2018 1024   CHOLHDL 3 12/22/2017 0907   VLDL 33.2 12/22/2017 0907   LDLCALC 54 03/26/2018 1024   LDLDIRECT 108.0 10/08/2016 0849   Hepatic  Function Panel     Component Value Date/Time   PROT 7.1 03/26/2018 1024   PROT 7.5 04/11/2017 0942   ALBUMIN 4.0 03/26/2018 1024   ALBUMIN 3.6 04/11/2017 0942   AST 65 (H) 03/26/2018 1024   AST 33 10/06/2017 1010   AST 41 (H) 04/11/2017 0942   ALT 44 (H) 03/26/2018 1024   ALT 30 10/06/2017 1010   ALT 40 04/11/2017 0942   ALKPHOS 121 (H) 03/26/2018 1024   ALKPHOS 93 04/11/2017 0942   BILITOT 0.4 03/26/2018 1024   BILITOT 0.5 10/06/2017 1010   BILITOT 0.48  04/11/2017 0942   BILIDIR 0.15 06/19/2017 0950   IBILI 0.3 05/24/2016 0828      Component Value Date/Time   TSH 2.130 03/26/2018 1024   TSH 1.87 12/22/2017 0907   TSH 0.81 09/22/2017 0850    Ref. Range 03/26/2018 10:24  Vitamin D, 25-Hydroxy Latest Ref Range: 30.0 - 100.0 ng/mL 9.1 ([name])      OBESITY BEHAVIORAL INTERVENTION VISIT  Today's visit was # 6   Starting weight: 323 lbs Starting date: 03/26/2018 Today's weight : 293 lbs  Today's date: 06/10/2018 Total lbs lost to date: 30 At least 15 minutes were spent on discussing the following behavioral intervention visit.   ASK: We discussed the diagnosis of obesity with [name] today and [name] agreed to give [name] permission to discuss obesity behavioral modification therapy today.  ASSESS: [name] has the diagnosis of obesity and her BMI today is 48.76 [name] is in the action stage of change   ADVISE: [name] was educated on the multiple health risks of obesity as well as the benefit of weight loss to improve her health. She was advised of the need for long term treatment and the importance of lifestyle modifications to improve her current health and to decrease her risk of future health problems.  AGREE: Multiple dietary modification options and treatment options were discussed and  Briannia agreed to follow the recommendations documented in the above note.  ARRANGE: Mariane was educated on the importance of frequent visits to treat obesity as outlined  per CMS and USPSTF guidelines and agreed to schedule her next follow up appointment today.  Corey Skains, am acting as Location manager for [employer]. Owens Shark, DO  I have reviewed the above documentation for accuracy and completeness, and I agree with the above. -Jearld Lesch, DO

## 2018-06-11 NOTE — Progress Notes (Unsigned)
Office: [phone]  /  Fax: [phone]   Date: June 25, 2018   Time Seen:*** Duration:*** Provider: Glennie Isle, Psy.D. Type of Session: Individual Therapy   HPI: [name] referred by Dr. Ronney Asters to depression with emotional eating behaviors.She was seen for an initial appointment by this provider on April 08, 2018.Per the note for the initial visit withDr. Norville Haggard March 26, 2018,"Ceciliais struggling with emotional eating and using food for comfort to the extent that it is negatively impacting herhealth. Sheoften snacks when sheis not hungry. Ceciliasometimes feels sheis out of control and then feels guilty that shemade poor food choices. [name] been working on behavior modification techniques to help reduce heremotional eating and has been somewhat successful.Sheshows no sign of suicidal or homicidal ideations."[name]'s Food and Mood (modified PHQ-9) score was19.In addition, per the note for the initial visit withDr. Norville Haggard March 26, 2018,Ceciliahas been heavy most of her life and started gaining weight after each baby. Her heaviest weight ever was 323 pounds. During her initial appointment with Dr. Teola Bradley experiencing the following:significant food cravings issues; snacking frequently in the evenings; frequently drinking liquids with calories; frequently making poor food choices; frequently eating larger portions than normal; and struggling with emotional eating.During the initial appointment with this provider, [name] reported, "I've been a little more lonely and I'd been caring for my mother." She indicated her mother passed away 09/11/2017. [name] added she lives alone, and uses food for comfort. She reported a history of binge eating, and noted, "Eating until you're sick." [name] reported a belief she eats to also "fill time." [name] reported, "I've always had emotional eating since I've been grown. I didn't  worry about my weight as a child." She explained when she visited her daughter and grandchildren in [place], she did not feel the loneliness and did not feel the need to eat. She stated a desire to visit them again. She denied a history of purging and engagement in other compensatory strategies. [name] denied ever being diagnosed with an eating disorder.Furthermore,[name] to complete a questionnaire assessing various behaviors related to emotional eating. [name] the following: overeat when you are celebrating, experience food cravings on a regular basis, eat certain foods when you are anxious, stressed, depressed, or your feelings are hurt, use food to help you cope with emotional situations, find food is comforting to you, overeat when you are angry or upset, overeat when you are worried about something, overeat frequently when you are bored or lonely, not worry about what you eat when you are in a good mood, overeat when you are angry at someone just to show them they cannot control you, overeat when you are alone, but eat much less when you are with other people and eat as a reward.  Session Content: Session focused on the following treatment goal: decrease emotional eating. The session was initiated with the administration of the PHQ-9 and GAD-7, as well as a brief check-in.   [name] was receptive to today's session as evidenced by openness to sharing, responsiveness to feedback, and ***.  Mental Status Examination: [name] arrived on time for the appointment. She presented as appropriately dressed and groomed. [name] appeared her stated age and demonstrated adequate orientation to time, place, person, and purpose of the appointment. She also demonstrated appropriate eye contact. No psychomotor abnormalities or behavioral peculiarities noted. Her mood was {gbmood:21757} with congruent affect. Her thought processes were logical, linear, and goal-directed. No hallucinations,  delusions, bizarre thinking or behavior reported or observed. Judgment,  insight, and impulse control appeared to be grossly intact. There was no evidence of paraphasias (i.e., errors in speech, gross mispronunciations, and word substitutions), repetition deficits, or disturbances in volume or prosody (i.e., rhythm and intonation). There was no evidence of attention or memory impairments. [name] denied current suicidal and homicidal ideation, intent or plan.  Structured Assessment Results: The Patient Health Questionnaire-9 (PHQ-9) is a self-report measure that assesses symptoms and severity of depression over the course of the last two weeks. [name] obtained a score of *** suggesting {GBPHQ9SEVERITY:21752}. [name] finds the endorsed symptoms to be {gbphq9difficulty:21754}.  The Generalized Anxiety Disorder-7 (GAD-7) is a brief self-report measure that assesses symptoms of anxiety over the course of the last two weeks. [name] obtained a score of *** suggesting {gbgad7severity:21753}.  Interventions:  {Interventions:22172}  DSM-5 Diagnosis: 296.31 (F33.0) Major Depressive Disorder, Recurrent Episode, Mild, With Anxious Distress, Mild  Treatment Goal & Progress: During the initial appointment with this provider, the following treatment goal was established: decrease emotional eating. [name] has demonstrated progress in her goal as evidenced by ***  Plan: [name] continues to appear able and willing to participate as evidenced by engagement in reciprocal conversation, and asking questions for clarification as appropriate.*** The next appointment will be scheduled in {gbweeks:21758}. The next session will focus on reviewing learned skills, and working towards the established treatment goal.***

## 2018-06-13 ENCOUNTER — Other Ambulatory Visit: Payer: Self-pay | Admitting: Family Medicine

## 2018-06-25 ENCOUNTER — Ambulatory Visit (INDEPENDENT_AMBULATORY_CARE_PROVIDER_SITE_OTHER): Payer: Self-pay | Admitting: Psychology

## 2018-06-29 ENCOUNTER — Other Ambulatory Visit: Payer: Self-pay

## 2018-06-29 NOTE — Patient Outreach (Signed)
[facility]) Care Management  06/29/2018   [name] 09/07/1950 [id number]  Subjective: Successful outreach to the patient for assessment. HIPAA verified.  The patient is doing well.  The patient is very happy she is going to the [facility] and has lost 30 lbs. Her family is very supportive of her.  She states that her blood sugars are ranging between 100-127.  She states that her goal is to lose enough weight that she will be able to come off some of her medications.  She denies any pain or falls.  She is monitoring her diet and taking her medications as ordered.  She has an appointment this Thursday with [facility].   Current Medications:  Current Outpatient Medications  Medication Sig Dispense Refill  . acetaminophen (TYLENOL) 500 MG tablet Take 1,000 mg by mouth 2 (two) times daily as needed for moderate pain or headache.    . albuterol (PROVENTIL HFA;VENTOLIN HFA) 108 (90 Base) MCG/ACT inhaler Inhale 2 puffs into the lungs every 6 (six) hours as needed for wheezing or shortness of breath. 1 Inhaler 12  . anastrozole (ARIMIDEX) 1 MG tablet TAKE 1 TABLET (1 MG TOTAL) BY MOUTH DAILY. 90 tablet 3  . aspirin EC 81 MG tablet Take 81 mg by mouth at bedtime.     Marland Kitchen BREO ELLIPTA 100-25 MCG/INH AEPB INHALE 1 PUFF INTO LUNGS ONCE A DAY 60 each 0  . BYSTOLIC 10 MG tablet TAKE 1 TABLET (10 MG TOTAL) BY MOUTH AT BEDTIME. 90 tablet 2  . diclofenac (VOLTAREN) 75 MG EC tablet TAKE 1 TABLET TWICE DAILY 180 tablet 1  . diltiazem (CARDIZEM CD) 120 MG 24 hr capsule TAKE 1 CAPSULE (120 MG TOTAL) BY MOUTH DAILY. 90 capsule 2  . escitalopram (LEXAPRO) 20 MG tablet TAKE 1 TABLET (20 MG TOTAL) BY MOUTH DAILY. 90 tablet 1  . esomeprazole (NEXIUM) 40 MG capsule Take 1 capsule (40 mg total) by mouth daily. 90 capsule 2  . ezetimibe (ZETIA) 10 MG tablet TAKE 1 TABLET EVERY DAY 90 tablet 3  . fluticasone (FLONASE) 50 MCG/ACT nasal spray Place 2 sprays into both nostrils  daily. 16 g 6  . furosemide (LASIX) 20 MG tablet TAKE 1 TABLET (20 MG TOTAL) BY MOUTH DAILY. 90 tablet 2  . glucose blood (ACCU-CHEK AVIVA) test strip Twice daily (please use brand that patient insurance covers) 100 each 5  . Hypromellose (ARTIFICIAL TEARS OP) Place 1 drop into both eyes daily as needed (dry eyes).     Marland Kitchen loratadine (CLARITIN) 10 MG tablet Take 1 tablet (10 mg total) by mouth daily. 30 tablet 11  . losartan (COZAAR) 100 MG tablet TAKE 1 TABLET EVERY DAY 90 tablet 2  . metFORMIN (GLUCOPHAGE) 500 MG tablet Take 1 tablet (500 mg total) by mouth 2 (two) times daily with a meal. 180 tablet 3  . montelukast (SINGULAIR) 10 MG tablet TAKE 1 TABLET (10 MG TOTAL) BY MOUTH AT BEDTIME. 90 tablet 2  . Probiotic Product (PROBIOTIC PO) Take 1 capsule by mouth at bedtime.    . ranitidine (ZANTAC) 150 MG tablet TAKE 1 TABLET (150 MG TOTAL) BY MOUTH AT BEDTIME. 90 tablet 1  . rosuvastatin (CRESTOR) 40 MG tablet TAKE 1 TABLET EVERY DAY 90 tablet 3  . Semaglutide,0.25 or 0.5MG /DOS, (OZEMPIC, 0.25 OR 0.5 MG/DOSE,) 2 MG/1.5ML SOPN Inject 0.5 mg into the skin once a week. 4 pen 0  . Vitamin D, Ergocalciferol, (DRISDOL) 1.25 MG (50000 UT) CAPS  capsule Take 1 capsule (50,000 Units total) by mouth every 7 (seven) days. 4 capsule 0  . famotidine (PEPCID) 20 MG tablet Take 1 tablet (20 mg total) by mouth 2 (two) times daily as needed for heartburn or indigestion. (Patient not taking: Reported on 06/29/2018) 60 tablet 3   No current facility-administered medications for this visit.     Functional Status:  No flowsheet data found.  Fall/Depression Screening: Fall Risk  06/29/2018 04/27/2018 02/24/2018  Falls in the past year? 0 0 No   PHQ 2/9 Scores 05/27/2018 05/07/2018 04/22/2018 04/08/2018 03/26/2018 05/29/2017 05/07/2017  PHQ - 2 Score 0 0 2 2 4  0 0  PHQ- 9 Score 0 1 4 6 19  - -  Exception Documentation - - - - Medical reason - -    Assessment: Patient will continue to benefit from health coach outreach  for disease management and support.  THN CM Care Plan Problem One     Most Recent Value  THN Long Term Goal   In 90 days the patient will have decreased her a1c by 2 points.  THN Long Term Goal Start Date  06/29/18  Interventions for Problem One Long Term Goal  reviewed with the patient about diet and her going to the health and wellness clinic.,medication adherence and cbg range.       Plan: RN Health Coach will contact patient in the month of  April and patient agrees to next outreach.   Lazaro Arms RN, BSN, [place] Direct Dial:  [phone]  Fax: [phone]

## 2018-07-02 ENCOUNTER — Encounter (INDEPENDENT_AMBULATORY_CARE_PROVIDER_SITE_OTHER): Payer: Self-pay | Admitting: Bariatrics

## 2018-07-02 ENCOUNTER — Ambulatory Visit (INDEPENDENT_AMBULATORY_CARE_PROVIDER_SITE_OTHER): Payer: Medicare HMO | Admitting: Bariatrics

## 2018-07-02 VITALS — BP 131/83 | HR 75 | Temp 97.6°F | Ht 65.0 in | Wt 286.0 lb

## 2018-07-02 DIAGNOSIS — Z6841 Body Mass Index (BMI) 40.0 and over, adult: Secondary | ICD-10-CM

## 2018-07-02 DIAGNOSIS — E559 Vitamin D deficiency, unspecified: Secondary | ICD-10-CM

## 2018-07-02 DIAGNOSIS — Z9189 Other specified personal risk factors, not elsewhere classified: Secondary | ICD-10-CM | POA: Diagnosis not present

## 2018-07-02 DIAGNOSIS — E119 Type 2 diabetes mellitus without complications: Secondary | ICD-10-CM | POA: Diagnosis not present

## 2018-07-02 NOTE — Progress Notes (Signed)
Office: [phone]  /  Fax: [phone]   HPI:   Chief Complaint: OBESITY [name] is here to discuss her progress with her obesity treatment plan. She is on the  keep a food journal with 350 to 450 calories and 30+ grams of protein at breakfast daily and mostly the Category 4 plan and is following her eating plan approximately 80 % of the time. She states she is walking 10 minutes 3 times per week. [name] is doing well. She did struggle during the holidays. [name] is using the MyFitnessPal application more and she has incorporated her protein. Her weight is 286 lb (129.7 kg) today and has had a weight loss of 7 pounds over a period of 3 weeks since her last visit. She has lost 37 lbs since starting treatment with [name].  Diabetes II [name] has a diagnosis of diabetes type II. [name] states fasting BGs range between 100 and 120's and she denies any hypoglycemic episodes. Last A1c was at 9.5 She has been working on intensive lifestyle modifications including diet, exercise, and weight loss to help control her blood glucose levels.  Vitamin D deficiency [name] has a diagnosis of vitamin D deficiency. She is currently taking vit D and denies nausea, vomiting or muscle weakness.  At risk for osteopenia and osteoporosis [name] is at higher risk of osteopenia and osteoporosis due to vitamin D deficiency.   ASSESSMENT AND PLAN:  Type 2 diabetes mellitus without complication, without long-term current use of insulin (HCC) - Plan: Comprehensive metabolic panel, Hemoglobin A1c, Insulin, random  Vitamin D deficiency - Plan: VITAMIN D 25 Hydroxy (Vit-D Deficiency, Fractures)  At risk for osteoporosis  Class 3 severe obesity with serious comorbidity and body mass index (BMI) of 45.0 to 49.9 in adult, unspecified obesity type ([place])  PLAN:  Diabetes II [name] has been given extensive diabetes education by myself today including ideal fasting and post-prandial blood glucose readings,  individual ideal Hgb A1c goals and hypoglycemia prevention. We discussed the importance of good blood sugar control to decrease the likelihood of diabetic complications such as nephropathy, neuropathy, limb loss, blindness, coronary artery disease, and death. We discussed the importance of intensive lifestyle modification including diet, exercise and weight loss as the first line treatment for diabetes. We will check Hgb A1c and insulin level today. [name] agrees to continue her diabetes medications and will follow up at the agreed upon time.  Vitamin D Deficiency [name] was informed that low vitamin D levels contributes to fatigue and are associated with obesity, breast, and colon cancer. She agrees to continue to take prescription Vit D @50 ,000 IU every week and will follow up for routine testing of vitamin D, at least 2-3 times per year. She was informed of the risk of over-replacement of vitamin D and agrees to not increase her dose unless she discusses this with [name] first. We will check vitamin D level and [name] will follow up as directed.  At risk for osteopenia and osteoporosis [name] was given extended  (15 minutes) osteoporosis prevention counseling today. [name] is at risk for osteopenia and osteoporosis due to her vitamin D deficiency. She was encouraged to take her vitamin D and follow her higher calcium diet and increase strengthening exercise to help strengthen her bones and decrease her risk of osteopenia and osteoporosis.  Obesity [name] is currently in the action stage of change. As such, her goal is to continue with weight loss efforts She has agreed to keep a food journal with 350 to 450  calories and 30 grams of protein at breakfast daily and follow the Category 3 plan [name] has been instructed to work up to a goal of 150 minutes of combined cardio and strengthening exercise per week for weight loss and overall health benefits. We discussed the following Behavioral  Modification Strategies today: increase H2O intake, increasing lean protein intake, decreasing simple carbohydrates, increasing vegetables, work on meal planning and easy cooking plans and decrease liquid calories  [name] has agreed to follow up with our clinic in 2 weeks. She was informed of the importance of frequent follow up visits to maximize her success with intensive lifestyle modifications for her multiple health conditions.  ALLERGIES: Allergies  Allergen Reactions  . No Known Allergies     MEDICATIONS: Current Outpatient Medications on File Prior to Visit  Medication Sig Dispense Refill  . acetaminophen (TYLENOL) 500 MG tablet Take 1,000 mg by mouth 2 (two) times daily as needed for moderate pain or headache.    . albuterol (PROVENTIL HFA;VENTOLIN HFA) 108 (90 Base) MCG/ACT inhaler Inhale 2 puffs into the lungs every 6 (six) hours as needed for wheezing or shortness of breath. 1 Inhaler 12  . anastrozole (ARIMIDEX) 1 MG tablet TAKE 1 TABLET (1 MG TOTAL) BY MOUTH DAILY. 90 tablet 3  . aspirin EC 81 MG tablet Take 81 mg by mouth at bedtime.     Marland Kitchen BREO ELLIPTA 100-25 MCG/INH AEPB INHALE 1 PUFF INTO LUNGS ONCE A DAY 60 each 0  . BYSTOLIC 10 MG tablet TAKE 1 TABLET (10 MG TOTAL) BY MOUTH AT BEDTIME. 90 tablet 2  . diclofenac (VOLTAREN) 75 MG EC tablet TAKE 1 TABLET TWICE DAILY 180 tablet 1  . diltiazem (CARDIZEM CD) 120 MG 24 hr capsule TAKE 1 CAPSULE (120 MG TOTAL) BY MOUTH DAILY. 90 capsule 2  . escitalopram (LEXAPRO) 20 MG tablet TAKE 1 TABLET (20 MG TOTAL) BY MOUTH DAILY. 90 tablet 1  . esomeprazole (NEXIUM) 40 MG capsule Take 1 capsule (40 mg total) by mouth daily. 90 capsule 2  . ezetimibe (ZETIA) 10 MG tablet TAKE 1 TABLET EVERY DAY 90 tablet 3  . famotidine (PEPCID) 20 MG tablet Take 1 tablet (20 mg total) by mouth 2 (two) times daily as needed for heartburn or indigestion. 60 tablet 3  . fluticasone (FLONASE) 50 MCG/ACT nasal spray Place 2 sprays into both nostrils daily. 16 g  6  . furosemide (LASIX) 20 MG tablet TAKE 1 TABLET (20 MG TOTAL) BY MOUTH DAILY. 90 tablet 2  . glucose blood (ACCU-CHEK AVIVA) test strip Twice daily (please use brand that patient insurance covers) 100 each 5  . Hypromellose (ARTIFICIAL TEARS OP) Place 1 drop into both eyes daily as needed (dry eyes).     Marland Kitchen loratadine (CLARITIN) 10 MG tablet Take 1 tablet (10 mg total) by mouth daily. 30 tablet 11  . losartan (COZAAR) 100 MG tablet TAKE 1 TABLET EVERY DAY 90 tablet 2  . metFORMIN (GLUCOPHAGE) 500 MG tablet Take 1 tablet (500 mg total) by mouth 2 (two) times daily with a meal. 180 tablet 3  . montelukast (SINGULAIR) 10 MG tablet TAKE 1 TABLET (10 MG TOTAL) BY MOUTH AT BEDTIME. 90 tablet 2  . Probiotic Product (PROBIOTIC PO) Take 1 capsule by mouth at bedtime.    . ranitidine (ZANTAC) 150 MG tablet TAKE 1 TABLET (150 MG TOTAL) BY MOUTH AT BEDTIME. 90 tablet 1  . rosuvastatin (CRESTOR) 40 MG tablet TAKE 1 TABLET EVERY DAY 90 tablet 3  .  Semaglutide,0.25 or 0.5MG /DOS, (OZEMPIC, 0.25 OR 0.5 MG/DOSE,) 2 MG/1.5ML SOPN Inject 0.5 mg into the skin once a week. 4 pen 0  . Vitamin D, Ergocalciferol, (DRISDOL) 1.25 MG (50000 UT) CAPS capsule Take 1 capsule (50,000 Units total) by mouth every 7 (seven) days. 4 capsule 0   No current facility-administered medications on file prior to visit.     PAST MEDICAL HISTORY: Past Medical History:  Diagnosis Date  . Anemia 04/05/2012  . Anxiety   . Anxiety state 09/11/2007   Qualifier: Diagnosis of  By: Scherrie Gerlach    . Asthma   . Atrial tachycardia, paroxysmal ([place])   . Back pain 10/02/2014  . Breast cancer ([place]) 02/17/2017  . Chicken pox as a child  . Chronic diastolic CHF (congestive heart failure), NYHA class 1 ([place])   . Complication of anesthesia    pt states feels different in her body after anesthesia when waking up and also experiences a smell of burnt plastic for approx a wk   . Constipation   . Depression   . Diabetes ([place])   . Dilated aortic  root ([place])    88mm by echo 09/2016  . Dysuria 02/17/2017  . Elevated LFTs 04/05/2012  . GERD (gastroesophageal reflux disease)   . Goiter   . Heart murmur    hx of one at birth   . History of kidney stones   . History of radiation therapy 10/31/16-12/17/16   left breast 45 Gy in 25 fractions, left breast boost 16 Gy in 8 fractions  . Hx of colonic polyps   . Hyperlipidemia   . Hypertension   . Insomnia 10/08/2016  . Joint pain   . Malignant neoplasm of upper-outer quadrant of left female breast ([employer]) 05/14/2016   not with patient  . Measles as a child  . Mumps as a child  . OA (osteoarthritis) of knee 04/05/2012  . Obesity 07/11/2014  . PONV (postoperative nausea and vomiting)   . Preventative health care 09/05/2013  . PVC's (premature ventricular contractions) 05/02/2016  . Shortness of breath dyspnea    walking distances / climbing stairs  . Sleep apnea 04/05/2012  . Tinea corporis 02/23/2013  . Type 2 diabetes mellitus with hyperglycemia ([place]) 12/31/2013  . Vasomotor rhinitis 04/05/2012  . Ventral hernia   . Wheezing     PAST SURGICAL HISTORY: Past Surgical History:  Procedure Laterality Date  . BREAST LUMPECTOMY Left 2017  . BREAST LUMPECTOMY WITH RADIOACTIVE SEED AND SENTINEL LYMPH NODE BIOPSY Left 06/21/2016   Procedure: LEFT BREAST LUMPECTOMY WITH RADIOACTIVE SEED AND SENTINEL LYMPH NODE BIOPSY, INJECT BLUE DYE LEFT BREAST;  Surgeon: Fanny Skates, MD;  Location: [place];  Service: General;  Laterality: Left;  . CARDIAC CATHETERIZATION     normal coroary arteries per patient  . CARDIOVASCULAR STRESS TEST     10/12/2013  . CESAREAN SECTION     X 3  . CHOLECYSTECTOMY    . COLONOSCOPY WITH PROPOFOL N/A 03/28/2015   Procedure: COLONOSCOPY WITH PROPOFOL;  Surgeon: Juanita Craver, MD;  Location: [facility];  Service: Endoscopy;  Laterality: N/A;  . HERNIA REPAIR  02/08/11   ventral hernia  . JOINT REPLACEMENT     bilateral  . KNEE ARTHROSCOPY  05/2010   bilateral  . LEFT AND  RIGHT HEART CATHETERIZATION WITH CORONARY ANGIOGRAM N/A 11/08/2013   Procedure: LEFT AND RIGHT HEART CATHETERIZATION WITH CORONARY ANGIOGRAM;  Surgeon: [name], MD;  Location: [facility] CATH LAB;  Service: Cardiovascular;  Laterality:  N/A;  . MENISCUS REPAIR  2009  . MOUTH SURGERY     teeth implants  . PORT-A-CATH REMOVAL N/A 01/24/2017   Procedure: REMOVAL PORT-A-CATH;  Surgeon: Fanny Skates, MD;  Location: [place];  Service: General;  Laterality: N/A;  . PORTACATH PLACEMENT Right 07/16/2016   Procedure: INSERTION PORT-A-CATH RIGHT INTERNAL JUGULAR WITH ULTRASOUND;  Surgeon: Fanny Skates, MD;  Location: [place];  Service: General;  Laterality: Right;  . TOTAL KNEE ARTHROPLASTY  2011   left  . TOTAL KNEE ARTHROPLASTY Right 05/10/2014   Procedure: RIGHT TOTAL KNEE ARTHROPLASTY;  Surgeon: Mauri Pole, MD;  Location: [facility];  Service: Orthopedics;  Laterality: Right;  . TUBAL LIGATION    . WISDOM TOOTH EXTRACTION  2000    SOCIAL HISTORY: Social History   Tobacco Use  . Smoking status: Never Smoker  . Smokeless tobacco: Never Used  Substance Use Topics  . Alcohol use: Yes    Comment: rarely  . Drug use: No    FAMILY HISTORY: Family History  Problem Relation Age of Onset  . Thyroid disease Mother   . Hyperlipidemia Mother   . Depression Mother   . Anxiety disorder Mother   . Heart attack Father 53  . Hypertension Father   . Arthritis Father        RA  . Coronary artery disease Father   . High Cholesterol Father   . Coronary artery disease Brother   . Heart disease Brother   . Cancer Maternal Aunt        colon  . Cancer Maternal Grandmother        colon  . Heart attack Maternal Grandfather   . Alcohol abuse Paternal Grandfather     ROS: Review of Systems  Constitutional: Positive for weight loss.  Gastrointestinal: Negative for nausea and vomiting.  Musculoskeletal:       Negative for muscle weakness  Endo/Heme/Allergies:       Negative for hypoglycemia     PHYSICAL EXAM: Blood pressure 131/83, pulse 75, temperature 97.6 F (36.4 C), temperature source Oral, height 5\' 5"  (1.651 m), weight 286 lb (129.7 kg), SpO2 98 %. Body mass index is 47.59 kg/m. Physical Exam Vitals signs reviewed.  Constitutional:      Appearance: Normal appearance. She is well-developed. She is obese.  Cardiovascular:     Rate and Rhythm: Normal rate.  Pulmonary:     Effort: Pulmonary effort is normal.  Musculoskeletal: Normal range of motion.  Skin:    General: Skin is warm and dry.  Neurological:     Mental Status: She is alert and oriented to person, place, and time.  Psychiatric:        Mood and Affect: Mood normal.        Behavior: Behavior normal.     RECENT LABS AND TESTS: BMET    Component Value Date/Time   NA 140 03/26/2018 1024   NA 145 04/11/2017 0942   K 4.6 03/26/2018 1024   K 4.4 04/11/2017 0942   CL 98 03/26/2018 1024   CO2 21 03/26/2018 1024   CO2 26 04/11/2017 0942   GLUCOSE 225 (H) 03/26/2018 1024   GLUCOSE 221 (H) 12/22/2017 0907   GLUCOSE 156 (H) 04/11/2017 0942   BUN 14 03/26/2018 1024   BUN 15.1 04/11/2017 0942   CREATININE 0.94 03/26/2018 1024   CREATININE 0.87 10/06/2017 1010   CREATININE 0.8 04/11/2017 0942   CALCIUM 9.9 03/26/2018 1024   CALCIUM 10.0 04/11/2017 0942   GFRNONAA 63  03/26/2018 1024   GFRNONAA >60 10/06/2017 1010   GFRAA 73 03/26/2018 1024   GFRAA >60 10/06/2017 1010   Lab Results  Component Value Date   HGBA1C 9.5 (H) 03/26/2018   HGBA1C 8.7 (H) 12/22/2017   HGBA1C 7.5 (H) 09/22/2017   HGBA1C 7.2 (H) 05/22/2017   HGBA1C 7.3 (H) 02/17/2017   Lab Results  Component Value Date   INSULIN 57.0 (H) 03/26/2018   CBC    Component Value Date/Time   WBC 9.0 03/26/2018 1024   WBC 7.5 12/22/2017 0907   RBC 4.95 03/26/2018 1024   RBC 4.87 12/22/2017 0907   HGB 12.4 03/26/2018 1024   HGB 12.2 04/11/2017 0942   HCT 40.3 03/26/2018 1024   HCT 39.1 04/11/2017 0942   PLT 282.0 12/22/2017 0907    PLT 274 10/06/2017 1010   PLT 240 04/11/2017 0942   MCV 81 03/26/2018 1024   MCV 81.6 04/11/2017 0942   MCH 25.1 (L) 03/26/2018 1024   MCH 25.6 10/06/2017 1010   MCHC 30.8 (L) 03/26/2018 1024   MCHC 31.1 12/22/2017 0907   RDW 17.1 (H) 03/26/2018 1024   RDW 19.2 (H) 04/11/2017 0942   LYMPHSABS 1.5 03/26/2018 1024   LYMPHSABS 1.4 04/11/2017 0942   MONOABS 0.5 12/22/2017 0907   MONOABS 0.3 04/11/2017 0942   EOSABS 0.0 03/26/2018 1024   BASOSABS 0.0 03/26/2018 1024   BASOSABS 0.0 04/11/2017 0942   Iron/TIBC/Ferritin/ %Sat No results found for: IRON, TIBC, FERRITIN, IRONPCTSAT Lipid Panel     Component Value Date/Time   CHOL 126 03/26/2018 1024   TRIG 188 (H) 03/26/2018 1024   HDL 34 (L) 03/26/2018 1024   CHOLHDL 3 12/22/2017 0907   VLDL 33.2 12/22/2017 0907   LDLCALC 54 03/26/2018 1024   LDLDIRECT 108.0 10/08/2016 0849   Hepatic Function Panel     Component Value Date/Time   PROT 7.1 03/26/2018 1024   PROT 7.5 04/11/2017 0942   ALBUMIN 4.0 03/26/2018 1024   ALBUMIN 3.6 04/11/2017 0942   AST 65 (H) 03/26/2018 1024   AST 33 10/06/2017 1010   AST 41 (H) 04/11/2017 0942   ALT 44 (H) 03/26/2018 1024   ALT 30 10/06/2017 1010   ALT 40 04/11/2017 0942   ALKPHOS 121 (H) 03/26/2018 1024   ALKPHOS 93 04/11/2017 0942   BILITOT 0.4 03/26/2018 1024   BILITOT 0.5 10/06/2017 1010   BILITOT 0.48 04/11/2017 0942   BILIDIR 0.15 06/19/2017 0950   IBILI 0.3 05/24/2016 0828      Component Value Date/Time   TSH 2.130 03/26/2018 1024   TSH 1.87 12/22/2017 0907   TSH 0.81 09/22/2017 0850   Results for [place], Alene A (MRN [id number]) as of 07/02/2018 16:00  Ref. Range 03/26/2018 10:24  Vitamin D, 25-Hydroxy Latest Ref Range: 30.0 - 100.0 ng/mL 9.1 (L)     OBESITY BEHAVIORAL INTERVENTION VISIT  Today's visit was # 7   Starting weight: 323 lbs Starting date: 03/26/2018 Today's weight : 286 lbs Today's date: 07/02/2018 Total lbs lost to date: 51   ASK: We discussed the  diagnosis of obesity with [name] today and Shereka agreed to give [name] permission to discuss obesity behavioral modification therapy today.  ASSESS: Fiorella has the diagnosis of obesity and her BMI today is 47.59 Sherisse is in the action stage of change   ADVISE: Attallah was educated on the multiple health risks of obesity as well as the benefit of weight loss to improve her health. She was advised of the  need for long term treatment and the importance of lifestyle modifications to improve her current health and to decrease her risk of future health problems.  AGREE: Multiple dietary modification options and treatment options were discussed and  Alanny agreed to follow the recommendations documented in the above note.  ARRANGE: [place] was educated on the importance of frequent visits to treat obesity as outlined per CMS and USPSTF guidelines and agreed to schedule her next follow up appointment today.  Corey Skains, am acting as Location manager for [employer]. Owens Shark, DO  I have reviewed the above documentation for accuracy and completeness, and I agree with the above. -[name] Lesch, DO

## 2018-07-03 LAB — COMPREHENSIVE METABOLIC PANEL
ALT: 24 IU/L (ref 0–32)
AST: 19 IU/L (ref 0–40)
Albumin/Globulin Ratio: 1.4 (ref 1.2–2.2)
Albumin: 4.1 g/dL (ref 3.6–4.8)
Alkaline Phosphatase: 136 IU/L — ABNORMAL HIGH (ref 39–117)
BUN/Creatinine Ratio: 30 — ABNORMAL HIGH (ref 12–28)
BUN: 24 mg/dL (ref 8–27)
Bilirubin Total: 0.3 mg/dL (ref 0.0–1.2)
CO2: 22 mmol/L (ref 20–29)
CREATININE: 0.8 mg/dL (ref 0.57–1.00)
Calcium: 10.3 mg/dL (ref 8.7–10.3)
Chloride: 100 mmol/L (ref 96–106)
GFR calc Af Amer: 88 mL/min/{1.73_m2} (ref >59)
GFR calc non Af Amer: 77 mL/min/{1.73_m2} (ref >59)
Globulin, Total: 3 g/dL (ref 1.5–4.5)
Glucose: 118 mg/dL — ABNORMAL HIGH (ref 65–99)
Potassium: 5 mmol/L (ref 3.5–5.2)
Sodium: 142 mmol/L (ref 134–144)
Total Protein: 7.1 g/dL (ref 6.0–8.5)

## 2018-07-03 LAB — VITAMIN D 25 HYDROXY (VIT D DEFICIENCY, FRACTURES): VIT D 25 HYDROXY: 31.4 ng/mL (ref 30.0–100.0)

## 2018-07-03 LAB — HEMOGLOBIN A1C
Est. average glucose Bld gHb Est-mCnc: 134 mg/dL
Hgb A1c MFr Bld: 6.3 % — ABNORMAL HIGH (ref 4.8–5.6)

## 2018-07-03 LAB — INSULIN, RANDOM: INSULIN: 59.3 u[IU]/mL — AB (ref 2.6–24.9)

## 2018-07-08 ENCOUNTER — Encounter (INDEPENDENT_AMBULATORY_CARE_PROVIDER_SITE_OTHER): Payer: Self-pay | Admitting: Bariatrics

## 2018-07-09 NOTE — Progress Notes (Signed)
Office: [phone]  /  Fax: [phone]    Date: July 16, 2018   Time Seen: 11:22am Duration: 33 minutes Provider: Glennie Isle, Psy.D. Type of Session: Individual Therapy  Type of Contact: Face-to-face  HPI: [name] referred by Dr. Ronney Asters to depression with emotional eating behaviors.She was seen for an initial appointment by this provider on April 08, 2018.Per the note for the initial visit withDr. Norville Haggard March 26, 2018,"Ceciliais struggling with emotional eating and using food for comfort to the extent that it is negatively impacting herhealth. Sheoften snacks when sheis not hungry. Ceciliasometimes feels sheis out of control and then feels guilty that shemade poor food choices. [name] been working on behavior modification techniques to help reduce heremotional eating and has been somewhat successful.Sheshows no sign of suicidal or homicidal ideations."Sanai's Food and Mood (modified PHQ-9) score was19.In addition, per the note for the initial visit withDr. Norville Haggard March 26, 2018,Ceciliahas been heavy most of her life and started gaining weight after each baby. Her heaviest weight ever was 323 pounds. During her initial appointment with Dr. Teola Bradley experiencing the following:significant food cravings issues; snacking frequently in the evenings; frequently drinking liquids with calories; frequently making poor food choices; frequently eating larger portions than normal; and struggling with emotional eating.During the initial appointment with this provider, Saige reported, "I've been a little more lonely and I'd been caring for my mother." She indicated her mother passed away 2017/09/07. Briawna added she lives alone, and uses food for comfort. She reported a history of binge eating, and noted, "Eating until you're sick." Vernel reported a belief she eats to also "fill time." Sophee reported, "I've always had  emotional eating since I've been grown. I didn't worry about my weight as a child." She explained when she visited her daughter and grandchildren in [place], she did not feel the loneliness and did not feel the need to eat. She stated a desire to visit them again. She denied a history of purging and engagement in other compensatory strategies. Amila denied ever being diagnosed with an eating disorder.Furthermore,[name] to complete a questionnaire assessing various behaviors related to emotional eating. [name] the following: overeat when you are celebrating, experience food cravings on a regular basis, eat certain foods when you are anxious, stressed, depressed, or your feelings are hurt, use food to help you cope with emotional situations, find food is comforting to you, overeat when you are angry or upset, overeat when you are worried about something, overeat frequently when you are bored or lonely, not worry about what you eat when you are in a good mood, overeat when you are angry at someone just to show them they cannot control you, overeat when you are alone, but eat much less when you are with other people and eat as a reward.  During today's appointment, Shaylah reported she continues to lose weight, but noted recent episodes of emotional eating secondary to her family leaving after the holidays.   Session Content: Session focused on the following treatment goal: decrease emotional eating. The session was initiated with the administration of the PHQ-9 and GAD-7, as well as a brief check-in. Ellenie shared, "I've been down, but I think it is the blues after the holidays." She explained she was "sad" and "upset" after her family left. As such, she noted a plan to go visit her family in the coming weeks. Regarding weight, Shakeeta shared she continues to lose weight. While she noted engaging in emotional eating after her family  left, she indicated making better choices (e.g., eating  fruit). Moreover, Rushie reported, "I've started looking at arts and craft." She reported a plan to start engaging in pleasurable activities again, especially when she takes a trip to visit her family. Psychoeducation regarding mindfulness was provided. A handout was provided to [facility] with further information regarding mindfulness, including exercises. This provider also explained the benefit of mindfulness as it relates to emotional eating. Lillion was encouraged to engage in the provided exercises between now and the next appointment with this provider. Keyia agreed. She was also led through a mindfulness exercise involving her senses. Psychoeducation regarding the hunger and satisfaction scale was provided as it relates to mindfulness, and a handout was given to [place]. Regarding termination, Milani was receptive to meeting with this provider for an additional appointment after she returns from her trip. Kijuana was receptive to today's session as evidenced by openness to sharing, responsiveness to feedback, and engagement in the mindfulness exercise. Additionally, at the conclusion of the appointment, Vivika noted, "I really enjoyed today's appointment."   Mental Status Examination: Elouise arrived early for the appointment. She presented as appropriately dressed and groomed. Kelsei appeared her stated age and demonstrated adequate orientation to time, place, person, and purpose of the appointment. She also demonstrated appropriate eye contact. No psychomotor abnormalities or behavioral peculiarities noted. Her mood was euthymic with congruent affect. Her thought processes were logical, linear, and goal-directed. No hallucinations, delusions, bizarre thinking or behavior reported or observed. Judgment, insight, and impulse control appeared to be grossly intact. There was no evidence of paraphasias (i.e., errors in speech, gross mispronunciations, and word substitutions), repetition deficits, or  disturbances in volume or prosody (i.e., rhythm and intonation). There was no evidence of attention or memory impairments. Jodette denied current suicidal and homicidal ideation, plan and intent.   Structured Assessment Results: The Patient Health Questionnaire-9 (PHQ-9) is a self-report measure that assesses symptoms and severity of depression over the course of the last two weeks. Natalynn obtained a score of 5 suggesting mild depression. Julius finds the endorsed symptoms to be somewhat difficult.  Depression screen PHQ 2/9 07/16/2018  Decreased Interest 1  Down, Depressed, Hopeless 1  PHQ - 2 Score 2  Altered sleeping 1  Tired, decreased energy 1  Change in appetite 1  Feeling bad or failure about yourself  0  Trouble concentrating 0  Moving slowly or fidgety/restless 0  Suicidal thoughts 0  PHQ-9 Score 5  Difficult doing work/chores -  Some recent data might be hidden   The Generalized Anxiety Disorder-7 (GAD-7) is a brief self-report measure that assesses symptoms of anxiety over the course of the last two weeks. Tierrah obtained a score of 3 suggesting minimal anxiety. GAD 7 : Generalized Anxiety Score 07/16/2018  Nervous, Anxious, on Edge 1  Control/stop worrying 1  Worry too much - different things 1  Trouble relaxing 0  Restless 0  Easily annoyed or irritable 0  Afraid - awful might happen 0  Total GAD 7 Score 3  Anxiety Difficulty Not difficult at all   Interventions:  Administration of PHQ-9 and GAD-7 for symptom monitoring Review of content from the previous session Empathic reflections and validation Psychoeducation regarding mindfulness Mindfulness exercise Termination planning Positive reinforcement Psychoeducation regarding the hunger and satisfaction scale Brief chart review  DSM-5 Diagnosis: 296.31 (F33.0) Major Depressive Disorder, Recurrent Episode, Mild, With Anxious Distress, Mild  Treatment Goal & Progress: During the initial appointment with this  provider, the following treatment goal was established:  decrease emotional eating. Deonna has demonstrated progress in her goal as evidenced by increased awareness in hunger patterns and triggers for emotional eating. Despite recent episodes of emotional eating, Denette discussed making better choices. She also continues to demonstrate willingness to engage in learned skills.   Plan: Shandra continues to appear able and willing to participate as evidenced by engagement in reciprocal conversation, and asking questions for clarification as appropriate. The next appointment will be scheduled in one month. The next session will focus on reviewing learned skills, and termination.

## 2018-07-13 ENCOUNTER — Other Ambulatory Visit: Payer: Self-pay | Admitting: Family Medicine

## 2018-07-15 IMAGING — MG NEEDLE LOCALIZATION OF THE LEFT BREAST WITH MAMMO GUIDANCE
2 series · 2 of 2 positions shown · non-contrast
Comparison: Previous exam(s).

CLINICAL DATA: The patient presents for radioactive seed
localization of a left breast carcinoma.

EXAM:
MAMMOGRAPHIC GUIDED RADIOACTIVE SEED LOCALIZATION OF THE LEFT BREAST

[L CC]
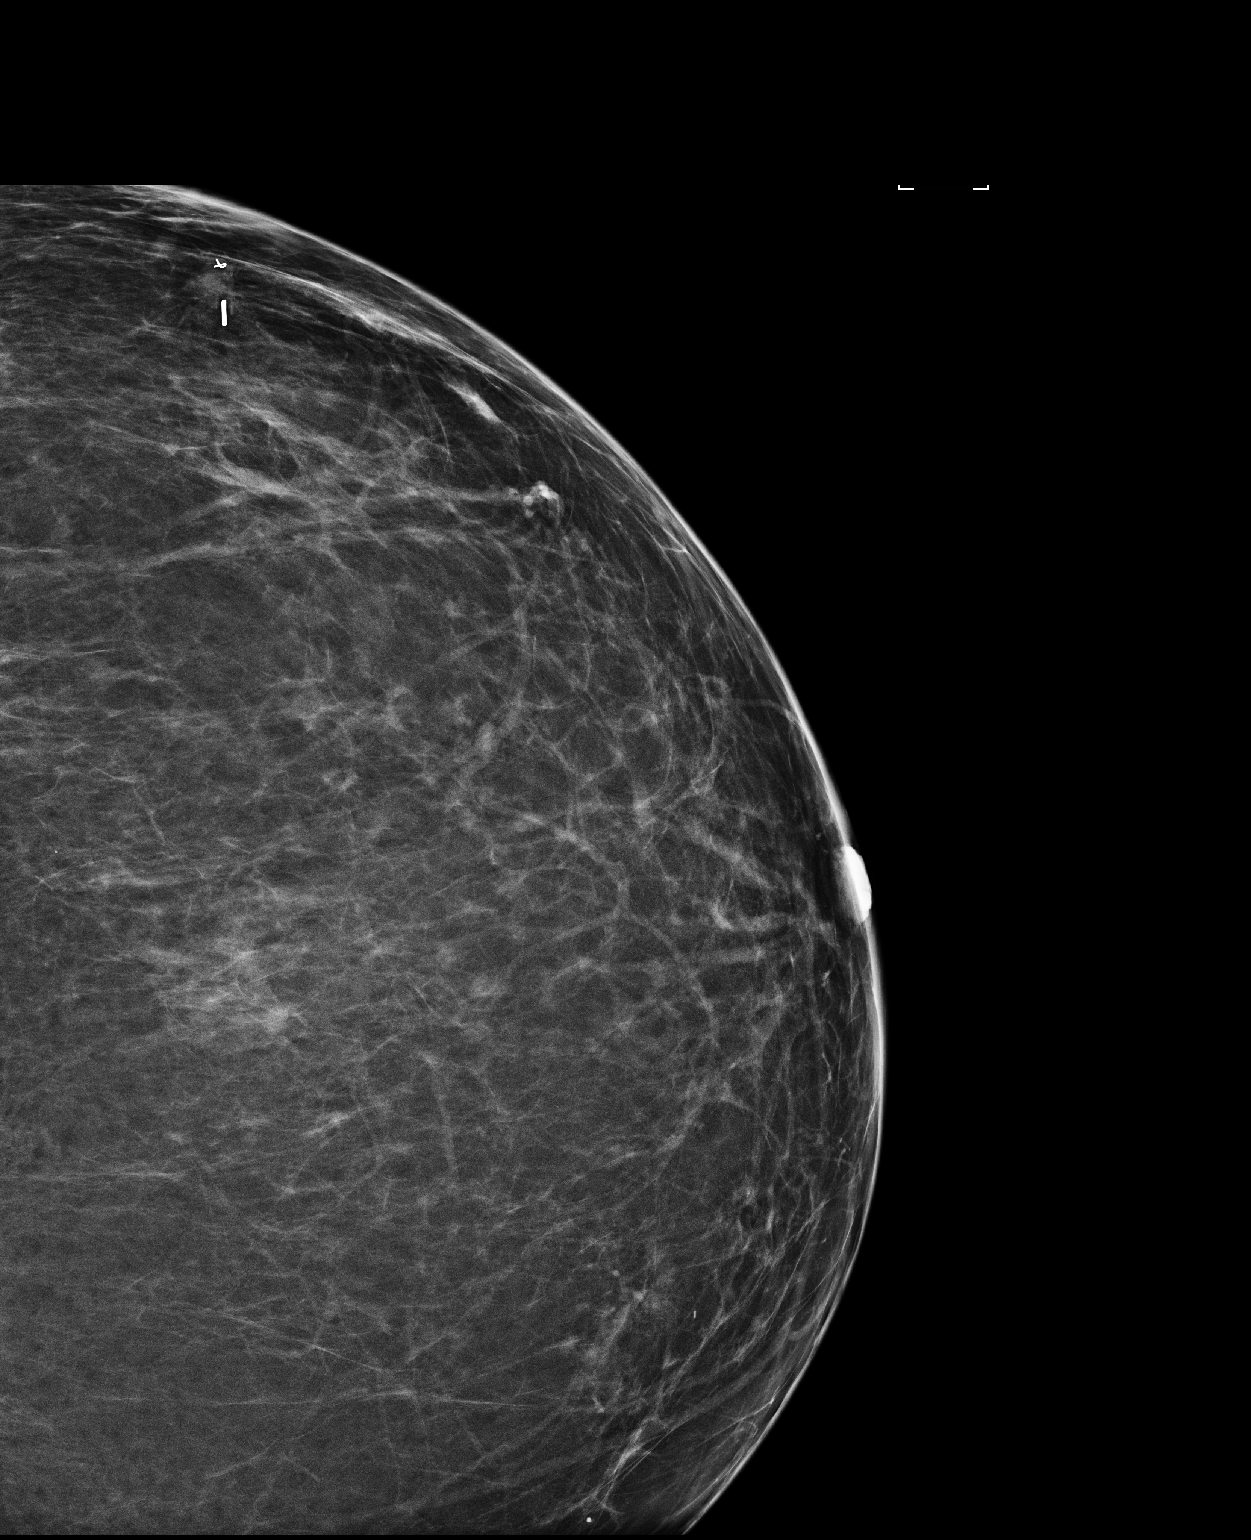

[L LM]
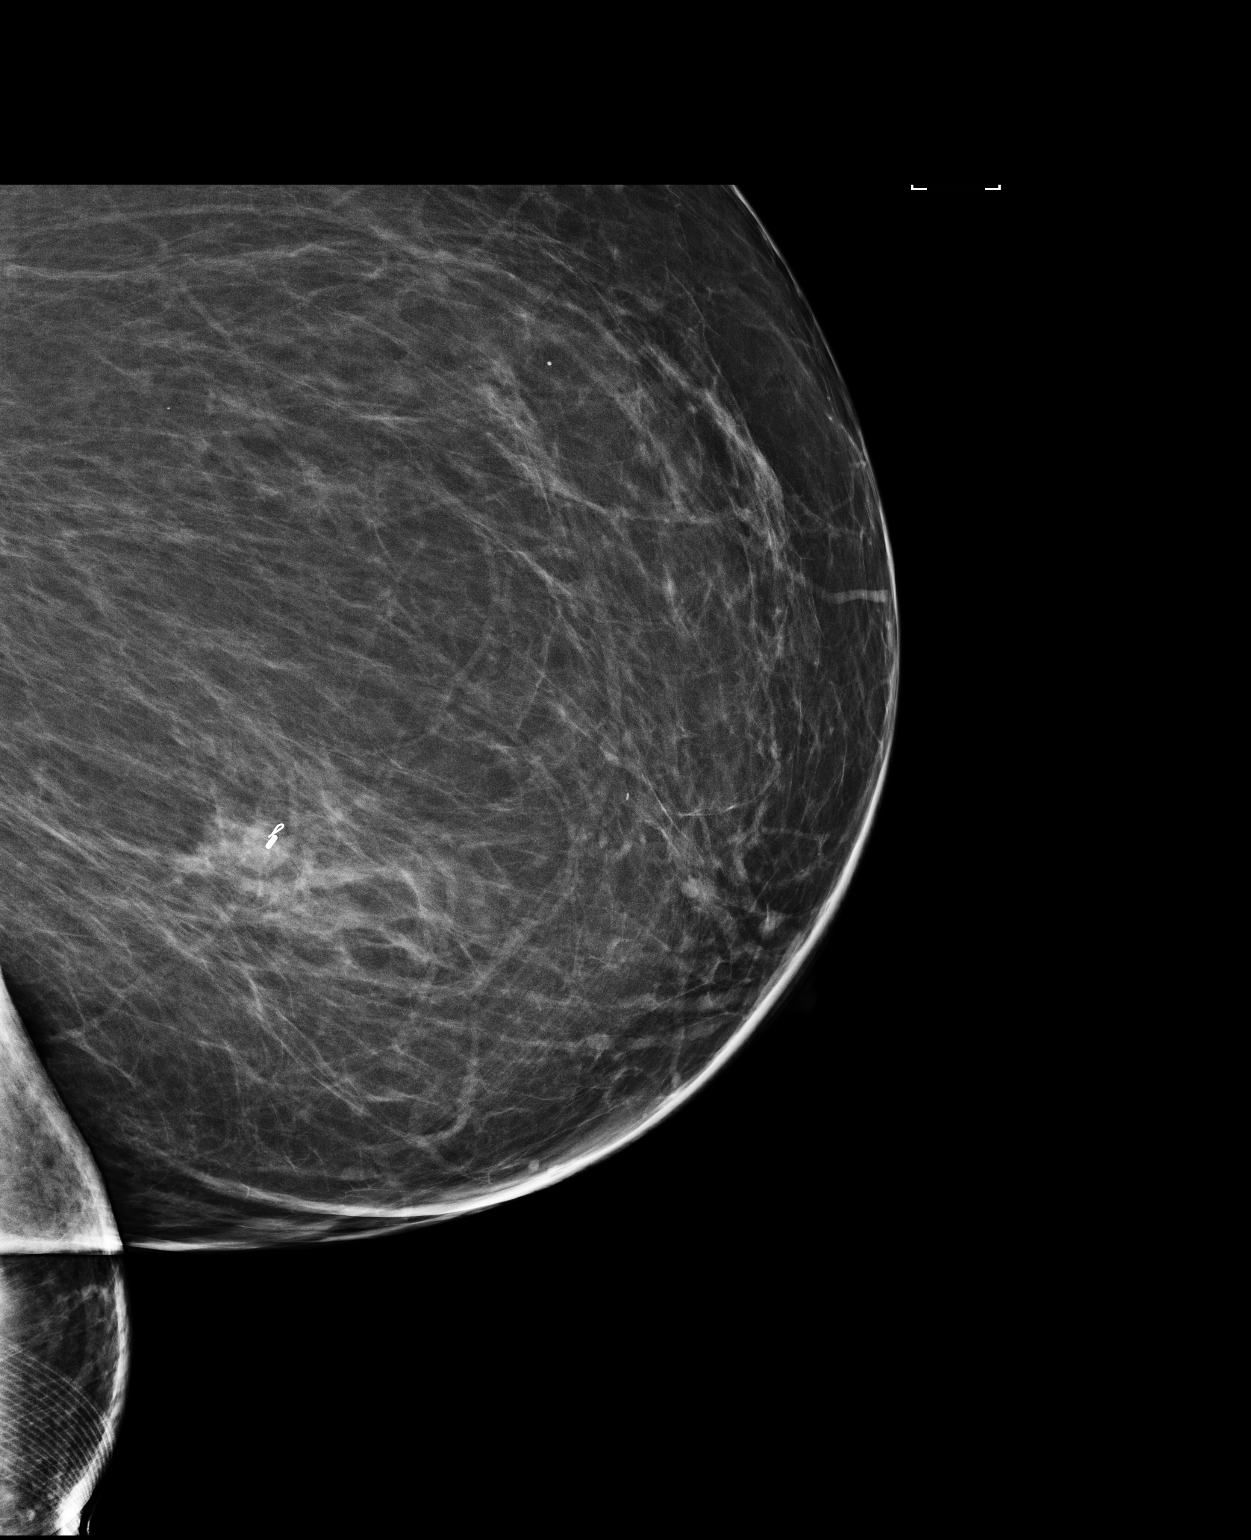

[2 of 2 positions shown; findings below may reference images not displayed]

FINDINGS: Patient presents for radioactive seed localization prior to surgical
excision. I met with the patient and we discussed the procedure of
seed localization including benefits and alternatives. We discussed
the high likelihood of a successful procedure. We discussed the
risks of the procedure including infection, bleeding, tissue injury
and further surgery. We discussed the low dose of radioactivity
involved in the procedure. Informed, written consent was given.

The usual time-out protocol was performed immediately prior to the
procedure.

Using mammographic guidance, sterile technique, 1% lidocaine and an
X-GNX radioactive seed, the small mass and biopsy clip were
localized using a lateral approach. The follow-up mammogram images
confirm the seed in the expected location and were marked for Dr.
Cristhian Alex.

Follow-up survey of the patient confirms presence of the radioactive
seed.

Order number of X-GNX seed:  [id number].

Total activity:  0.236 millicuries  Reference Date: 05/28/2016

The patient tolerated the procedure well and was released from the
[REDACTED]. She was given instructions regarding seed removal.
IMPRESSION: Radioactive seed localization of the left breast. No apparent
complications.

## 2018-07-16 ENCOUNTER — Ambulatory Visit (INDEPENDENT_AMBULATORY_CARE_PROVIDER_SITE_OTHER): Payer: Medicare HMO | Admitting: Psychology

## 2018-07-16 ENCOUNTER — Ambulatory Visit (INDEPENDENT_AMBULATORY_CARE_PROVIDER_SITE_OTHER): Payer: Medicare HMO | Admitting: Bariatrics

## 2018-07-16 ENCOUNTER — Encounter (INDEPENDENT_AMBULATORY_CARE_PROVIDER_SITE_OTHER): Payer: Self-pay | Admitting: Bariatrics

## 2018-07-16 VITALS — BP 136/80 | HR 72 | Temp 98.4°F | Ht 65.0 in | Wt 285.0 lb

## 2018-07-16 DIAGNOSIS — E119 Type 2 diabetes mellitus without complications: Secondary | ICD-10-CM | POA: Diagnosis not present

## 2018-07-16 DIAGNOSIS — K5909 Other constipation: Secondary | ICD-10-CM

## 2018-07-16 DIAGNOSIS — Z6841 Body Mass Index (BMI) 40.0 and over, adult: Secondary | ICD-10-CM | POA: Diagnosis not present

## 2018-07-16 DIAGNOSIS — F330 Major depressive disorder, recurrent, mild: Secondary | ICD-10-CM | POA: Diagnosis not present

## 2018-07-16 DIAGNOSIS — E559 Vitamin D deficiency, unspecified: Secondary | ICD-10-CM | POA: Diagnosis not present

## 2018-07-16 MED ORDER — SEMAGLUTIDE(0.25 OR 0.5MG/DOS) 2 MG/1.5ML ~~LOC~~ SOPN: 0.5000 mg | PEN_INJECTOR | SUBCUTANEOUS | 0 refills | Status: DC

## 2018-07-20 NOTE — Progress Notes (Signed)
Office: [phone]  /  Fax: [phone]   HPI:   Chief Complaint: OBESITY [name] is here to discuss her progress with her obesity treatment plan. She is on the keep a food journal with 350 to 450 calories and 30 grams of protein at breakfast daily and the Category 3 plan and is following her eating plan approximately 70 % of the time. She states she is walking 10 minutes 2 times per week. [name] is doing well overall. She has struggled a little over the last few weeks. Her weight is 285 lb (129.3 kg) today and has had a weight loss of 1 pound over a period of 2 weeks since her last visit. She has lost 38 lbs since starting treatment with [name].  Diabetes II [name] has a diagnosis of diabetes type II. [name] states fasting BGs range between 110 and 120's. Last A1c was at 6.3, down from 9.5 [name] is taking Semaglutide and Metformin. She has been working on intensive lifestyle modifications including diet, exercise, and weight loss to help control her blood glucose levels.  Vitamin D deficiency [name] has a diagnosis of vitamin D deficiency. Her last vitamin D level was at 31.4 She is currently taking vit D and denies nausea, vomiting or muscle weakness.  Constipation [name] notes constipation for the last few weeks, worse since attempting weight loss. She states BM are less frequent and are hard. She admits to dyspepsia and denies nausea or vomiting.    ASSESSMENT AND PLAN:  Type 2 diabetes mellitus without complication, without long-term current use of insulin (HCC) - Plan: Semaglutide,0.25 or 0.5MG /DOS, (OZEMPIC, 0.25 OR 0.5 MG/DOSE,) 2 MG/1.5ML SOPN  Vitamin D deficiency  Other constipation  Class 3 severe obesity with serious comorbidity and body mass index (BMI) of 45.0 to 49.9 in adult, unspecified obesity type ([place])  PLAN:  Diabetes II [name] has been given extensive diabetes education by myself today including ideal fasting and post-prandial blood glucose readings,  individual ideal Hgb A1c goals and hypoglycemia prevention. We discussed the importance of good blood sugar control to decrease the likelihood of diabetic complications such as nephropathy, neuropathy, limb loss, blindness, coronary artery disease, and death. We discussed the importance of intensive lifestyle modification including diet, exercise and weight loss as the first line treatment for diabetes. [place] agrees to continue her diabetes medications (Ozempic 0.5 mg once weekly into the skin #4 pens with no refills) and follow up at the agreed upon time.  Vitamin D Deficiency [name] was informed that low vitamin D levels contributes to fatigue and are associated with obesity, breast, and colon cancer. She agrees to continue to take prescription Vit D @50 ,000 IU every week and will follow up for routine testing of vitamin D, at least 2-3 times per year. She was informed of the risk of over-replacement of vitamin D and agrees to not increase her dose unless she discusses this with [name] first.  Constipation [name] was informed decrease bowel movement frequency is normal while losing weight, but stools should not be hard or painful. She was advised to increase her H20 intake and work on increasing her fiber intake. High fiber foods were discussed today. [name] agreed to take Miralax 3 times per week and/or stool softeners and follow up as directed.  Obesity [name] is currently in the action stage of change. As such, her goal is to continue with weight loss efforts She has agreed to follow the Category 4 plan [name] has been instructed to work up to a  goal of 150 minutes of combined cardio and strengthening exercise per week for weight loss and overall health benefits. We discussed the following Behavioral Modification Strategies today: increase H2O intake, keeping healthy foods in the home, increasing lean protein intake, decreasing simple carbohydrates, increasing vegetables and work on meal  planning and intentional eating  [name] has agreed to follow up with our clinic in 4 weeks. She was informed of the importance of frequent follow up visits to maximize her success with intensive lifestyle modifications for her multiple health conditions.  ALLERGIES: Allergies  Allergen Reactions  . No Known Allergies     MEDICATIONS: Current Outpatient Medications on File Prior to Visit  Medication Sig Dispense Refill  . acetaminophen (TYLENOL) 500 MG tablet Take 1,000 mg by mouth 2 (two) times daily as needed for moderate pain or headache.    . albuterol (PROVENTIL HFA;VENTOLIN HFA) 108 (90 Base) MCG/ACT inhaler Inhale 2 puffs into the lungs every 6 (six) hours as needed for wheezing or shortness of breath. 1 Inhaler 12  . anastrozole (ARIMIDEX) 1 MG tablet TAKE 1 TABLET (1 MG TOTAL) BY MOUTH DAILY. 90 tablet 3  . aspirin EC 81 MG tablet Take 81 mg by mouth at bedtime.     Marland Kitchen BREO ELLIPTA 100-25 MCG/INH AEPB INHALE 1 PUFF INTO LUNGS ONCE A DAY 60 each 0  . BYSTOLIC 10 MG tablet TAKE 1 TABLET (10 MG TOTAL) BY MOUTH AT BEDTIME. 90 tablet 2  . diclofenac (VOLTAREN) 75 MG EC tablet TAKE 1 TABLET TWICE DAILY 180 tablet 1  . diltiazem (CARDIZEM CD) 120 MG 24 hr capsule TAKE 1 CAPSULE (120 MG TOTAL) BY MOUTH DAILY. 90 capsule 2  . escitalopram (LEXAPRO) 20 MG tablet TAKE 1 TABLET (20 MG TOTAL) BY MOUTH DAILY. 90 tablet 1  . esomeprazole (NEXIUM) 40 MG capsule TAKE 1 CAPSULE EVERY DAY 90 capsule 2  . ezetimibe (ZETIA) 10 MG tablet TAKE 1 TABLET EVERY DAY 90 tablet 3  . famotidine (PEPCID) 20 MG tablet Take 1 tablet (20 mg total) by mouth 2 (two) times daily as needed for heartburn or indigestion. 60 tablet 3  . fluticasone (FLONASE) 50 MCG/ACT nasal spray Place 2 sprays into both nostrils daily. 16 g 6  . furosemide (LASIX) 20 MG tablet TAKE 1 TABLET (20 MG TOTAL) BY MOUTH DAILY. 90 tablet 2  . glucose blood (ACCU-CHEK AVIVA) test strip Twice daily (please use brand that patient insurance covers)  100 each 5  . Hypromellose (ARTIFICIAL TEARS OP) Place 1 drop into both eyes daily as needed (dry eyes).     Marland Kitchen loratadine (CLARITIN) 10 MG tablet Take 1 tablet (10 mg total) by mouth daily. 30 tablet 11  . losartan (COZAAR) 100 MG tablet TAKE 1 TABLET EVERY DAY 90 tablet 2  . metFORMIN (GLUCOPHAGE) 500 MG tablet Take 1 tablet (500 mg total) by mouth 2 (two) times daily with a meal. 180 tablet 3  . montelukast (SINGULAIR) 10 MG tablet TAKE 1 TABLET (10 MG TOTAL) BY MOUTH AT BEDTIME. 90 tablet 2  . Probiotic Product (PROBIOTIC PO) Take 1 capsule by mouth at bedtime.    . ranitidine (ZANTAC) 150 MG tablet TAKE 1 TABLET (150 MG TOTAL) BY MOUTH AT BEDTIME. 90 tablet 1  . rosuvastatin (CRESTOR) 40 MG tablet TAKE 1 TABLET EVERY DAY 90 tablet 3  . Vitamin D, Ergocalciferol, (DRISDOL) 1.25 MG (50000 UT) CAPS capsule Take 1 capsule (50,000 Units total) by mouth every 7 (seven) days. 4 capsule 0  No current facility-administered medications on file prior to visit.     PAST MEDICAL HISTORY: Past Medical History:  Diagnosis Date  . Anemia 04/05/2012  . Anxiety   . Anxiety state 09/11/2007   Qualifier: Diagnosis of  By: Scherrie Gerlach    . Asthma   . Atrial tachycardia, paroxysmal ([place])   . Back pain 10/02/2014  . Breast cancer ([place]) 02/17/2017  . Chicken pox as a child  . Chronic diastolic CHF (congestive heart failure), NYHA class 1 ([place])   . Complication of anesthesia    pt states feels different in her body after anesthesia when waking up and also experiences a smell of burnt plastic for approx a wk   . Constipation   . Depression   . Diabetes ([place])   . Dilated aortic root ([place])    21mm by echo 09/2016  . Dysuria 02/17/2017  . Elevated LFTs 04/05/2012  . GERD (gastroesophageal reflux disease)   . Goiter   . Heart murmur    hx of one at birth   . History of kidney stones   . History of radiation therapy 10/31/16-12/17/16   left breast 45 Gy in 25 fractions, left breast boost 16 Gy in 8  fractions  . Hx of colonic polyps   . Hyperlipidemia   . Hypertension   . Insomnia 10/08/2016  . Joint pain   . Malignant neoplasm of upper-outer quadrant of left female breast ([place]) 05/14/2016   not with patient  . Measles as a child  . Mumps as a child  . OA (osteoarthritis) of knee 04/05/2012  . Obesity 07/11/2014  . PONV (postoperative nausea and vomiting)   . Preventative health care 09/05/2013  . PVC's (premature ventricular contractions) 05/02/2016  . Shortness of breath dyspnea    walking distances / climbing stairs  . Sleep apnea 04/05/2012  . Tinea corporis 02/23/2013  . Type 2 diabetes mellitus with hyperglycemia ([place]) 12/31/2013  . Vasomotor rhinitis 04/05/2012  . Ventral hernia   . Wheezing     PAST SURGICAL HISTORY: Past Surgical History:  Procedure Laterality Date  . BREAST LUMPECTOMY Left 2017  . BREAST LUMPECTOMY WITH RADIOACTIVE SEED AND SENTINEL LYMPH NODE BIOPSY Left 06/21/2016   Procedure: LEFT BREAST LUMPECTOMY WITH RADIOACTIVE SEED AND SENTINEL LYMPH NODE BIOPSY, INJECT BLUE DYE LEFT BREAST;  Surgeon: Fanny Skates, MD;  Location: [facility];  Service: General;  Laterality: Left;  . CARDIAC CATHETERIZATION     normal coroary arteries per patient  . CARDIOVASCULAR STRESS TEST     10/12/2013  . CESAREAN SECTION     X 3  . CHOLECYSTECTOMY    . COLONOSCOPY WITH PROPOFOL N/A 03/28/2015   Procedure: COLONOSCOPY WITH PROPOFOL;  Surgeon: Juanita Craver, MD;  Location: [facility];  Service: Endoscopy;  Laterality: N/A;  . HERNIA REPAIR  02/08/11   ventral hernia  . JOINT REPLACEMENT     bilateral  . KNEE ARTHROSCOPY  05/2010   bilateral  . LEFT AND RIGHT HEART CATHETERIZATION WITH CORONARY ANGIOGRAM N/A 11/08/2013   Procedure: LEFT AND RIGHT HEART CATHETERIZATION WITH CORONARY ANGIOGRAM;  Surgeon: [name], MD;  Location: [facility] CATH LAB;  Service: Cardiovascular;  Laterality: N/A;  . MENISCUS REPAIR  2009  . MOUTH SURGERY     teeth implants  . PORT-A-CATH  REMOVAL N/A 01/24/2017   Procedure: REMOVAL PORT-A-CATH;  Surgeon: Fanny Skates, MD;  Location: [facility];  Service: General;  Laterality: N/A;  . PORTACATH PLACEMENT Right 07/16/2016   Procedure:  INSERTION PORT-A-CATH RIGHT INTERNAL JUGULAR WITH ULTRASOUND;  Surgeon: Fanny Skates, MD;  Location: [facility];  Service: General;  Laterality: Right;  . TOTAL KNEE ARTHROPLASTY  2011   left  . TOTAL KNEE ARTHROPLASTY Right 05/10/2014   Procedure: RIGHT TOTAL KNEE ARTHROPLASTY;  Surgeon: Mauri Pole, MD;  Location: [facility];  Service: Orthopedics;  Laterality: Right;  . TUBAL LIGATION    . WISDOM TOOTH EXTRACTION  2000    SOCIAL HISTORY: Social History   Tobacco Use  . Smoking status: Never Smoker  . Smokeless tobacco: Never Used  Substance Use Topics  . Alcohol use: Yes    Comment: rarely  . Drug use: No    FAMILY HISTORY: Family History  Problem Relation Age of Onset  . Thyroid disease Mother   . Hyperlipidemia Mother   . Depression Mother   . Anxiety disorder Mother   . Heart attack Father 83  . Hypertension Father   . Arthritis Father        RA  . Coronary artery disease Father   . High Cholesterol Father   . Coronary artery disease Brother   . Heart disease Brother   . Cancer Maternal Aunt        colon  . Cancer Maternal Grandmother        colon  . Heart attack Maternal Grandfather   . Alcohol abuse Paternal Grandfather     ROS: Review of Systems  Constitutional: Positive for weight loss.  Gastrointestinal: Negative for nausea and vomiting.       Positive for dyspepsia  Musculoskeletal:       Negative for muscle weakness    PHYSICAL EXAM: Blood pressure 136/80, pulse 72, temperature 98.4 F (36.9 C), temperature source Oral, height 5\' 5"  (1.651 m), weight 285 lb (129.3 kg), SpO2 98 %. Body mass index is 47.43 kg/m. Physical Exam Vitals signs reviewed.  Constitutional:      Appearance: Normal appearance. She is well-developed. She is obese.  Cardiovascular:      Rate and Rhythm: Normal rate.  Pulmonary:     Effort: Pulmonary effort is normal.  Musculoskeletal: Normal range of motion.  Skin:    General: Skin is warm and dry.  Neurological:     Mental Status: She is alert and oriented to person, place, and time.  Psychiatric:        Mood and Affect: Mood normal.        Behavior: Behavior normal.     RECENT LABS AND TESTS: BMET    Component Value Date/Time   NA 142 07/02/2018 1230   NA 145 04/11/2017 0942   K 5.0 07/02/2018 1230   K 4.4 04/11/2017 0942   CL 100 07/02/2018 1230   CO2 22 07/02/2018 1230   CO2 26 04/11/2017 0942   GLUCOSE 118 (H) 07/02/2018 1230   GLUCOSE 221 (H) 12/22/2017 0907   GLUCOSE 156 (H) 04/11/2017 0942   BUN 24 07/02/2018 1230   BUN 15.1 04/11/2017 0942   CREATININE 0.80 07/02/2018 1230   CREATININE 0.87 10/06/2017 1010   CREATININE 0.8 04/11/2017 0942   CALCIUM 10.3 07/02/2018 1230   CALCIUM 10.0 04/11/2017 0942   GFRNONAA 77 07/02/2018 1230   GFRNONAA >60 10/06/2017 1010   GFRAA 88 07/02/2018 1230   GFRAA >60 10/06/2017 1010   Lab Results  Component Value Date   HGBA1C 6.3 (H) 07/02/2018   HGBA1C 9.5 (H) 03/26/2018   HGBA1C 8.7 (H) 12/22/2017   HGBA1C 7.5 (H) 09/22/2017  HGBA1C 7.2 (H) 05/22/2017   Lab Results  Component Value Date   INSULIN 59.3 (H) 07/02/2018   INSULIN 57.0 (H) 03/26/2018   CBC    Component Value Date/Time   WBC 9.0 03/26/2018 1024   WBC 7.5 12/22/2017 0907   RBC 4.95 03/26/2018 1024   RBC 4.87 12/22/2017 0907   HGB 12.4 03/26/2018 1024   HGB 12.2 04/11/2017 0942   HCT 40.3 03/26/2018 1024   HCT 39.1 04/11/2017 0942   PLT 282.0 12/22/2017 0907   PLT 274 10/06/2017 1010   PLT 240 04/11/2017 0942   MCV 81 03/26/2018 1024   MCV 81.6 04/11/2017 0942   MCH 25.1 (L) 03/26/2018 1024   MCH 25.6 10/06/2017 1010   MCHC 30.8 (L) 03/26/2018 1024   MCHC 31.1 12/22/2017 0907   RDW 17.1 (H) 03/26/2018 1024   RDW 19.2 (H) 04/11/2017 0942   LYMPHSABS 1.5 03/26/2018 1024    LYMPHSABS 1.4 04/11/2017 0942   MONOABS 0.5 12/22/2017 0907   MONOABS 0.3 04/11/2017 0942   EOSABS 0.0 03/26/2018 1024   BASOSABS 0.0 03/26/2018 1024   BASOSABS 0.0 04/11/2017 0942   Iron/TIBC/Ferritin/ %Sat No results found for: IRON, TIBC, FERRITIN, IRONPCTSAT Lipid Panel     Component Value Date/Time   CHOL 126 03/26/2018 1024   TRIG 188 (H) 03/26/2018 1024   HDL 34 (L) 03/26/2018 1024   CHOLHDL 3 12/22/2017 0907   VLDL 33.2 12/22/2017 0907   LDLCALC 54 03/26/2018 1024   LDLDIRECT 108.0 10/08/2016 0849   Hepatic Function Panel     Component Value Date/Time   PROT 7.1 07/02/2018 1230   PROT 7.5 04/11/2017 0942   ALBUMIN 4.1 07/02/2018 1230   ALBUMIN 3.6 04/11/2017 0942   AST 19 07/02/2018 1230   AST 33 10/06/2017 1010   AST 41 (H) 04/11/2017 0942   ALT 24 07/02/2018 1230   ALT 30 10/06/2017 1010   ALT 40 04/11/2017 0942   ALKPHOS 136 (H) 07/02/2018 1230   ALKPHOS 93 04/11/2017 0942   BILITOT 0.3 07/02/2018 1230   BILITOT 0.5 10/06/2017 1010   BILITOT 0.48 04/11/2017 0942   BILIDIR 0.15 06/19/2017 0950   IBILI 0.3 05/24/2016 0828      Component Value Date/Time   TSH 2.130 03/26/2018 1024   TSH 1.87 12/22/2017 0907   TSH 0.81 09/22/2017 0850     Ref. Range 07/02/2018 12:30  Vitamin D, 25-Hydroxy Latest Ref Range: 30.0 - 100.0 ng/mL 31.4     OBESITY BEHAVIORAL INTERVENTION VISIT  Today's visit was # 8   Starting weight: 323 lbs Starting date: 03/26/2018 Today's weight : 285 lbs  Today's date: 07/16/2018 Total lbs lost to date: 38 At least 15 minutes were spent on discussing the following behavioral intervention visit.   ASK: We discussed the diagnosis of obesity with [name] today and [name] agreed to give [name] permission to discuss obesity behavioral modification therapy today.  ASSESS: [name] has the diagnosis of obesity and her BMI today is 47.43 [name] is in the action stage of change   ADVISE: [name] was educated on the multiple  health risks of obesity as well as the benefit of weight loss to improve her health. She was advised of the need for long term treatment and the importance of lifestyle modifications to improve her current health and to decrease her risk of future health problems.  AGREE: Multiple dietary modification options and treatment options were discussed and  [name] agreed to follow the recommendations documented in the  above note.  ARRANGE: [name] was educated on the importance of frequent visits to treat obesity as outlined per CMS and USPSTF guidelines and agreed to schedule her next follow up appointment today.  Corey Skains, am acting as Location manager for [employer]. Owens Shark, DO  I have reviewed the above documentation for accuracy and completeness, and I agree with the above. -Jearld Lesch, DO

## 2018-07-29 ENCOUNTER — Other Ambulatory Visit (INDEPENDENT_AMBULATORY_CARE_PROVIDER_SITE_OTHER): Payer: Self-pay | Admitting: Bariatrics

## 2018-07-29 DIAGNOSIS — E559 Vitamin D deficiency, unspecified: Secondary | ICD-10-CM

## 2018-08-12 IMAGING — CR DG CHEST 1V PORT
1 series · 1 of 1 positions shown · non-contrast
Comparison: 07/07/2015

CLINICAL DATA: Post Port-A-Cath placement

EXAM:
PORTABLE CHEST 1 VIEW

[AP]
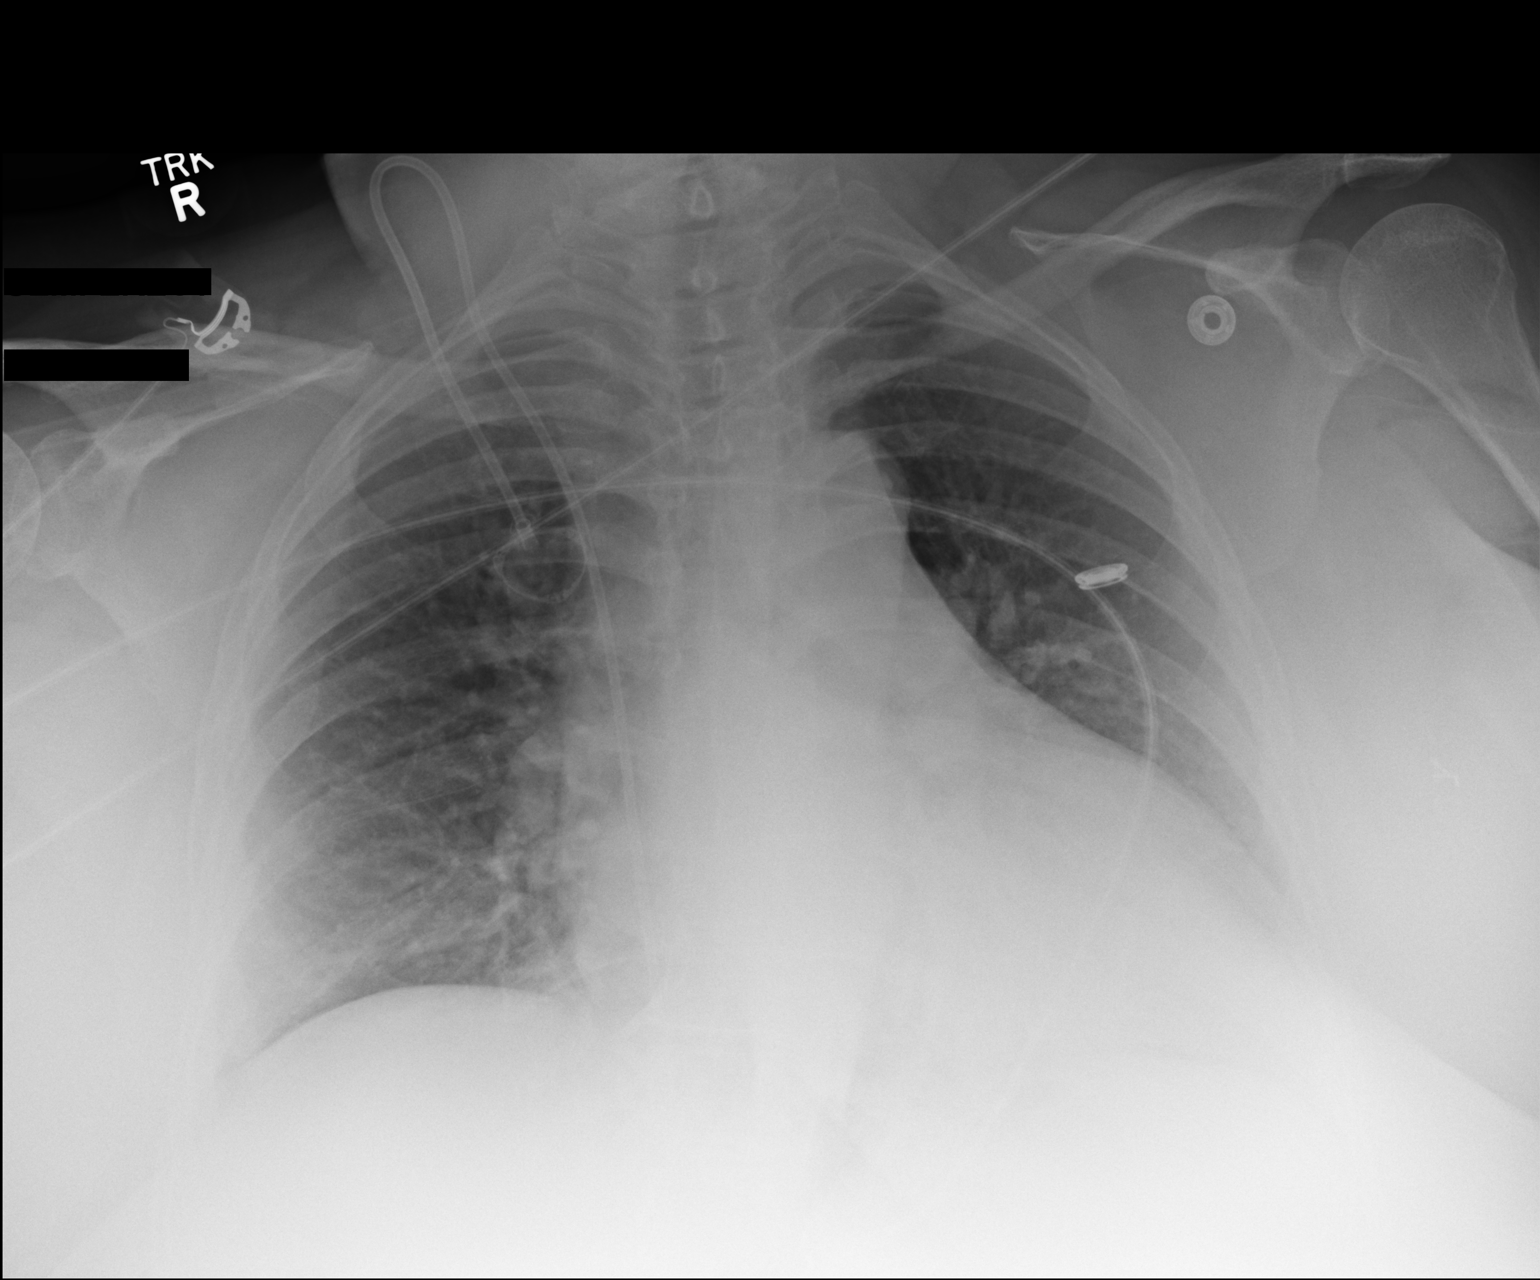

[1 of 1 positions shown; findings below may reference images not displayed]

FINDINGS: Interval placement of right internal jugular Port-A-Cath. The tip is
in the lower right atrium approximately 7-8 cm below the IVC right
atrial junction. No pneumothorax. There is cardiomegaly with
vascular congestion. No confluent opacities or effusions.
IMPRESSION: Right Port-A-Cath placement with the tip in the lower right atrium
approximately 7-8 cm below the cavoatrial junction.

Cardiomegaly, vascular congestion.

No pneumothorax.

## 2018-08-13 ENCOUNTER — Ambulatory Visit (INDEPENDENT_AMBULATORY_CARE_PROVIDER_SITE_OTHER): Payer: Medicare HMO | Admitting: Bariatrics

## 2018-08-13 ENCOUNTER — Ambulatory Visit (INDEPENDENT_AMBULATORY_CARE_PROVIDER_SITE_OTHER): Payer: Medicare HMO | Admitting: Psychology

## 2018-08-13 ENCOUNTER — Encounter (INDEPENDENT_AMBULATORY_CARE_PROVIDER_SITE_OTHER): Payer: Self-pay | Admitting: Bariatrics

## 2018-08-13 VITALS — BP 136/83 | HR 72 | Temp 98.3°F | Ht 65.0 in | Wt 277.0 lb

## 2018-08-13 DIAGNOSIS — Z6841 Body Mass Index (BMI) 40.0 and over, adult: Secondary | ICD-10-CM | POA: Diagnosis not present

## 2018-08-13 DIAGNOSIS — Z9189 Other specified personal risk factors, not elsewhere classified: Secondary | ICD-10-CM

## 2018-08-13 DIAGNOSIS — E559 Vitamin D deficiency, unspecified: Secondary | ICD-10-CM

## 2018-08-13 DIAGNOSIS — E119 Type 2 diabetes mellitus without complications: Secondary | ICD-10-CM

## 2018-08-13 DIAGNOSIS — F330 Major depressive disorder, recurrent, mild: Secondary | ICD-10-CM | POA: Diagnosis not present

## 2018-08-13 MED ORDER — SEMAGLUTIDE(0.25 OR 0.5MG/DOS) 2 MG/1.5ML ~~LOC~~ SOPN: 0.5000 mg | PEN_INJECTOR | SUBCUTANEOUS | 0 refills | Status: DC

## 2018-08-13 NOTE — Progress Notes (Signed)
Office: [phone]  /  Fax: [phone]    Date: August 13, 2018   Time Seen: 8:30am Duration: 28 minutes Provider: Glennie Isle, Psy.D. Type of Session: Individual Therapy  Type of Contact: Face-to-face  Session Content: [name] is a 68 y.o. female presenting for a follow-up appointment to address the previously established treatment goal of decreasing emotional eating as well as termination of services with this provider. The session was initiated with the administration of the PHQ-9 and GAD-7, as well as a brief check-in. [name] shared she visited with her daughter and grandchildren, and added, "It was great!" Since returning home, [name] shared, "It hasn't been as bad as I thought it would." Additionally, she discussed avoiding people and situations that make her feel "low." [name] added, "I'm more upbeat." Regarding eating, [name] acknowledged, "I ate a bunch of things that were different, but I still tried to stay on it [referring to the structured meal plan] the best I could." She noted she made better choices overall, and denied episodes of emotional eating. In regard to mindfulness, [name] described being in the moment with her grandchildren, and she noted she observed the impact of that on her eating. She discussed a plan to continue practicing mindfulness. [name] was led through a mindfulness exercise, "A Taste of Mindfulness." At the conclusion of the exercise, she noted, "It was marvelous." She was provided with a handout for the exercise. At this time, [name] indicated she does not feel she needs longer-term therapeutic services. She noted, "I'm doing better with momma's passing." [name] was receptive to today's session as evidenced by openness to sharing, responsiveness to feedback, and engagement in the mindfulness exercise.  Mental Status Examination: [name] arrived on time for the appointment. She presented as appropriately dressed and groomed. [name] appeared her  stated age and demonstrated adequate orientation to time, place, person, and purpose of the appointment. She also demonstrated appropriate eye contact. No psychomotor abnormalities or behavioral peculiarities noted. Her mood was euthymic with congruent affect. Her thought processes were logical, linear, and goal-directed. No hallucinations, delusions, bizarre thinking or behavior reported or observed. Judgment, insight, and impulse control appeared to be grossly intact. There was no evidence of paraphasias (i.e., errors in speech, gross mispronunciations, and word substitutions), repetition deficits, or disturbances in volume or prosody (i.e., rhythm and intonation). There was no evidence of attention or memory impairments. [name] denied current suicidal and homicidal ideation, plan and intent.   Structured Assessment Results: The Patient Health Questionnaire-9 (PHQ-9) is a self-report measure that assesses symptoms and severity of depression over the course of the last two weeks. [name] obtained a score of 2 suggesting minimal depression. [name] finds the endorsed symptoms to be not difficult at all. Depression screen [name] 2/9 08/13/2018  Decreased Interest 0  Down, Depressed, Hopeless 0  PHQ - 2 Score 0  Altered sleeping 1  Tired, decreased energy 1  Change in appetite 0  Feeling bad or failure about yourself  0  Trouble concentrating 0  Moving slowly or fidgety/restless 0  Suicidal thoughts 0  PHQ-9 Score 2  Difficult doing work/chores -  Some recent data might be hidden   The Generalized Anxiety Disorder-7 (GAD-7) is a brief self-report measure that assesses symptoms of anxiety over the course of the last two weeks. [name] obtained a score of zero. GAD 7 : Generalized Anxiety Score 08/13/2018  Nervous, Anxious, on Edge 0  Control/stop worrying 0  Worry too much - different things 0  Trouble relaxing 0  Restless  0  Easily annoyed or irritable 0  Afraid - awful might happen 0  Total GAD  7 Score 0  Anxiety Difficulty -   Interventions:  Administration of PHQ-9 and GAD-7 for symptom monitoring Empathic reflections and validation Mindfulness exercise Reviewed learned skills Discussed option for a referral for longer-term therapeutic services Positive reinforcement Brief chart review  DSM-5 Diagnosis: 296.31 (F33.0) Major Depressive Disorder, Recurrent Episode, Mild, With Anxious Distress, Mild  Treatment Goal & Progress: During the initial appointment with this provider, the following treatment goal was established: decrease emotional eating. [name] has demonstrated progress in her goal as evidenced by increased awareness of hunger patterns and triggers for emotional eating. Since the onset of treatment, there has been a reduction in emotional eating. During today's appointment, [name] denied episodes of emotional eating since the last appointment with this provider. She also continues to demonstrate willingness to engage in learned skills.   Plan: Today was [name]'s last appointment with this provider. She verbally acknowledged understanding that she may request a referral in the future for longer-term therapeutic services if deemed necessary.

## 2018-08-13 NOTE — Progress Notes (Signed)
Office: [phone]  /  Fax: [phone]   HPI:   Chief Complaint: OBESITY [name] is here to discuss her progress with her obesity treatment plan. She is on the Category 4 plan and is following her eating plan approximately 70 % of the time. She states she is walking 5,1000 steps 3 times per week. [name] is doing well with her plan. She has been traveling but she has had more activity. She stayed on her plan mostly and she increased her protein. Her weight is 277 lb (125.6 kg) today and has had a weight loss of 8 pounds over a period of 4 weeks since her last visit. She has lost 46 lbs since starting treatment with [name].  Diabetes II [name] has a diagnosis of diabetes type II. She is taking Semaglutide and Metformin. [name] states fasting BGs range in the low 100's and she denies polyphagia. Last A1c was at 6.3 and last insulin level was at  She has been working on intensive lifestyle modifications including diet, exercise, and weight loss to help control her blood glucose levels.  Vitamin D deficiency [name] has a diagnosis of vitamin D deficiency. She is currently taking high dose vit D and denies nausea, vomiting or muscle weakness.  At risk for osteopenia and osteoporosis [name] is at higher risk of osteopenia and osteoporosis due to vitamin D deficiency.   ASSESSMENT AND PLAN:  Type 2 diabetes mellitus without complication, without long-term current use of insulin (HCC) - Plan: Semaglutide,0.25 or 0.5MG /DOS, (OZEMPIC, 0.25 OR 0.5 MG/DOSE,) 2 MG/1.5ML SOPN  Vitamin D deficiency  At risk for osteoporosis  Class 3 severe obesity with serious comorbidity and body mass index (BMI) of 45.0 to 49.9 in adult, unspecified obesity type ([place])  PLAN:  Diabetes II [name] has been given extensive diabetes education by myself today including ideal fasting and post-prandial blood glucose readings, individual ideal Hgb A1c goals and hypoglycemia prevention. We discussed the importance  of good blood sugar control to decrease the likelihood of diabetic complications such as nephropathy, neuropathy, limb loss, blindness, coronary artery disease, and death. We discussed the importance of intensive lifestyle modification including diet, exercise and weight loss as the first line treatment for diabetes. [name] agrees to continue Semaglutide 0.5 mg into the skin at supper  once a week #4 pens with no refills and will follow up at the agreed upon time.  Vitamin D Deficiency [name] was informed that low vitamin D levels contributes to fatigue and are associated with obesity, breast, and colon cancer. She agrees to continue to take prescription Vit D @50 ,000 IU every week and will follow up for routine testing of vitamin D, at least 2-3 times per year. She was informed of the risk of over-replacement of vitamin D and agrees to not increase her dose unless she discusses this with [name] first.  At risk for osteopenia and osteoporosis [name] was given extended  (15 minutes) osteoporosis prevention counseling today. [name] is at risk for osteopenia and osteoporosis due to her vitamin D deficiency. She was encouraged to take her vitamin D and follow her higher calcium diet and increase strengthening exercise to help strengthen her bones and decrease her risk of osteopenia and osteoporosis.  Obesity [name] is currently in the action stage of change. As such, her goal is to continue with weight loss efforts She has agreed to follow the Category 4 [place] has been instructed to work up to a goal of 150 minutes of combined cardio and strengthening exercise  per week for weight loss and overall health benefits. We discussed the following Behavioral Modification Strategies today: increase H2O intake, no skipping meals, increasing lean protein intake, decreasing simple carbohydrates, increasing vegetables and work on meal planning and easy cooking plans Additional lunch options were provided to  patient today.  [name] has agreed to follow up with our clinic in 2 weeks. She was informed of the importance of frequent follow up visits to maximize her success with intensive lifestyle modifications for her multiple health conditions.  ALLERGIES: Allergies  Allergen Reactions  . No Known Allergies     MEDICATIONS: Current Outpatient Medications on File Prior to Visit  Medication Sig Dispense Refill  . acetaminophen (TYLENOL) 500 MG tablet Take 1,000 mg by mouth 2 (two) times daily as needed for moderate pain or headache.    . albuterol (PROVENTIL HFA;VENTOLIN HFA) 108 (90 Base) MCG/ACT inhaler Inhale 2 puffs into the lungs every 6 (six) hours as needed for wheezing or shortness of breath. 1 Inhaler 12  . anastrozole (ARIMIDEX) 1 MG tablet TAKE 1 TABLET (1 MG TOTAL) BY MOUTH DAILY. 90 tablet 3  . aspirin EC 81 MG tablet Take 81 mg by mouth at bedtime.     Marland Kitchen BREO ELLIPTA 100-25 MCG/INH AEPB INHALE 1 PUFF INTO LUNGS ONCE A DAY 60 each 0  . BYSTOLIC 10 MG tablet TAKE 1 TABLET (10 MG TOTAL) BY MOUTH AT BEDTIME. 90 tablet 2  . diclofenac (VOLTAREN) 75 MG EC tablet TAKE 1 TABLET TWICE DAILY 180 tablet 1  . diltiazem (CARDIZEM CD) 120 MG 24 hr capsule TAKE 1 CAPSULE (120 MG TOTAL) BY MOUTH DAILY. 90 capsule 2  . escitalopram (LEXAPRO) 20 MG tablet TAKE 1 TABLET (20 MG TOTAL) BY MOUTH DAILY. 90 tablet 1  . esomeprazole (NEXIUM) 40 MG capsule TAKE 1 CAPSULE EVERY DAY 90 capsule 2  . ezetimibe (ZETIA) 10 MG tablet TAKE 1 TABLET EVERY DAY 90 tablet 3  . famotidine (PEPCID) 20 MG tablet Take 1 tablet (20 mg total) by mouth 2 (two) times daily as needed for heartburn or indigestion. 60 tablet 3  . fluticasone (FLONASE) 50 MCG/ACT nasal spray Place 2 sprays into both nostrils daily. 16 g 6  . furosemide (LASIX) 20 MG tablet TAKE 1 TABLET (20 MG TOTAL) BY MOUTH DAILY. 90 tablet 2  . glucose blood (ACCU-CHEK AVIVA) test strip Twice daily (please use brand that patient insurance covers) 100 each 5  .  Hypromellose (ARTIFICIAL TEARS OP) Place 1 drop into both eyes daily as needed (dry eyes).     Marland Kitchen loratadine (CLARITIN) 10 MG tablet Take 1 tablet (10 mg total) by mouth daily. 30 tablet 11  . losartan (COZAAR) 100 MG tablet TAKE 1 TABLET EVERY DAY 90 tablet 2  . metFORMIN (GLUCOPHAGE) 500 MG tablet Take 1 tablet (500 mg total) by mouth 2 (two) times daily with a meal. 180 tablet 3  . montelukast (SINGULAIR) 10 MG tablet TAKE 1 TABLET (10 MG TOTAL) BY MOUTH AT BEDTIME. 90 tablet 2  . Probiotic Product (PROBIOTIC PO) Take 1 capsule by mouth at bedtime.    . ranitidine (ZANTAC) 150 MG tablet TAKE 1 TABLET (150 MG TOTAL) BY MOUTH AT BEDTIME. 90 tablet 1  . rosuvastatin (CRESTOR) 40 MG tablet TAKE 1 TABLET EVERY DAY 90 tablet 3  . Vitamin D, Ergocalciferol, (DRISDOL) 1.25 MG (50000 UT) CAPS capsule Take 1 capsule (50,000 Units total) by mouth every 7 (seven) days. 2 capsule 0   No current facility-administered  medications on file prior to visit.     PAST MEDICAL HISTORY: Past Medical History:  Diagnosis Date  . Anemia 04/05/2012  . Anxiety   . Anxiety state 09/11/2007   Qualifier: Diagnosis of  By: Scherrie Gerlach    . Asthma   . Atrial tachycardia, paroxysmal ([place])   . Back pain 10/02/2014  . Breast cancer ([place]) 02/17/2017  . Chicken pox as a child  . Chronic diastolic CHF (congestive heart failure), NYHA class 1 ([place])   . Complication of anesthesia    pt states feels different in her body after anesthesia when waking up and also experiences a smell of burnt plastic for approx a wk   . Constipation   . Depression   . Diabetes ([place])   . Dilated aortic root ([place])    82mm by echo 09/2016  . Dysuria 02/17/2017  . Elevated LFTs 04/05/2012  . GERD (gastroesophageal reflux disease)   . Goiter   . Heart murmur    hx of one at birth   . History of kidney stones   . History of radiation therapy 10/31/16-12/17/16   left breast 45 Gy in 25 fractions, left breast boost 16 Gy in 8 fractions  . Hx of  colonic polyps   . Hyperlipidemia   . Hypertension   . Insomnia 10/08/2016  . Joint pain   . Malignant neoplasm of upper-outer quadrant of left female breast ([place]) 05/14/2016   not with patient  . Measles as a child  . Mumps as a child  . OA (osteoarthritis) of knee 04/05/2012  . Obesity 07/11/2014  . PONV (postoperative nausea and vomiting)   . Preventative health care 09/05/2013  . PVC's (premature ventricular contractions) 05/02/2016  . Shortness of breath dyspnea    walking distances / climbing stairs  . Sleep apnea 04/05/2012  . Tinea corporis 02/23/2013  . Type 2 diabetes mellitus with hyperglycemia ([place]) 12/31/2013  . Vasomotor rhinitis 04/05/2012  . Ventral hernia   . Wheezing     PAST SURGICAL HISTORY: Past Surgical History:  Procedure Laterality Date  . BREAST LUMPECTOMY Left 2017  . BREAST LUMPECTOMY WITH RADIOACTIVE SEED AND SENTINEL LYMPH NODE BIOPSY Left 06/21/2016   Procedure: LEFT BREAST LUMPECTOMY WITH RADIOACTIVE SEED AND SENTINEL LYMPH NODE BIOPSY, INJECT BLUE DYE LEFT BREAST;  Surgeon: Fanny Skates, MD;  Location: [facility];  Service: General;  Laterality: Left;  . CARDIAC CATHETERIZATION     normal coroary arteries per patient  . CARDIOVASCULAR STRESS TEST     10/12/2013  . CESAREAN SECTION     X 3  . CHOLECYSTECTOMY    . COLONOSCOPY WITH PROPOFOL N/A 03/28/2015   Procedure: COLONOSCOPY WITH PROPOFOL;  Surgeon: Juanita Craver, MD;  Location: [facility];  Service: Endoscopy;  Laterality: N/A;  . HERNIA REPAIR  02/08/11   ventral hernia  . JOINT REPLACEMENT     bilateral  . KNEE ARTHROSCOPY  05/2010   bilateral  . LEFT AND RIGHT HEART CATHETERIZATION WITH CORONARY ANGIOGRAM N/A 11/08/2013   Procedure: LEFT AND RIGHT HEART CATHETERIZATION WITH CORONARY ANGIOGRAM;  Surgeon: [name], MD;  Location: [facility] C[facility];  Service: Cardiovascular;  Laterality: N/A;  . MENISCUS REPAIR  2009  . MOUTH SURGERY     teeth implants  . PORT-A-CATH REMOVAL N/A 01/24/2017     Procedure: REMOVAL PORT-A-CATH;  Surgeon: Fanny Skates, MD;  Location: [place];  Service: General;  Laterality: N/A;  . PORTACATH PLACEMENT Right 07/16/2016   Procedure: INSERTION PORT-A-CATH  RIGHT INTERNAL JUGULAR WITH ULTRASOUND;  Surgeon: Fanny Skates, MD;  Location: [place];  Service: General;  Laterality: Right;  . TOTAL KNEE ARTHROPLASTY  2011   left  . TOTAL KNEE ARTHROPLASTY Right 05/10/2014   Procedure: RIGHT TOTAL KNEE ARTHROPLASTY;  Surgeon: Mauri Pole, MD;  Location: [facility];  Service: Orthopedics;  Laterality: Right;  . TUBAL LIGATION    . WISDOM TOOTH EXTRACTION  2000    SOCIAL HISTORY: Social History   Tobacco Use  . Smoking status: Never Smoker  . Smokeless tobacco: Never Used  Substance Use Topics  . Alcohol use: Yes    Comment: rarely  . Drug use: No    FAMILY HISTORY: Family History  Problem Relation Age of Onset  . Thyroid disease Mother   . Hyperlipidemia Mother   . Depression Mother   . Anxiety disorder Mother   . Heart attack Father 37  . Hypertension Father   . Arthritis Father        RA  . Coronary artery disease Father   . High Cholesterol Father   . Coronary artery disease Brother   . Heart disease Brother   . Cancer Maternal Aunt        colon  . Cancer Maternal Grandmother        colon  . Heart attack Maternal Grandfather   . Alcohol abuse Paternal Grandfather     ROS: Review of Systems  Constitutional: Positive for weight loss.  Gastrointestinal: Negative for nausea and vomiting.  Musculoskeletal:       Negative for muscle weakness  Endo/Heme/Allergies:       Negative for polyphagia    PHYSICAL EXAM: Blood pressure 136/83, pulse 72, temperature 98.3 F (36.8 C), temperature source Oral, height 5\' 5"  (1.651 m), weight 277 lb (125.6 kg), SpO2 98 %. Body mass index is 46.1 kg/m. Physical Exam Vitals signs reviewed.  Constitutional:      Appearance: Normal appearance. She is well-developed. She is obese.  Cardiovascular:      Rate and Rhythm: Normal rate.  Pulmonary:     Effort: Pulmonary effort is normal.  Musculoskeletal: Normal range of motion.  Skin:    General: Skin is warm and dry.  Neurological:     Mental Status: She is alert and oriented to person, place, and time.  Psychiatric:        Mood and Affect: Mood normal.        Behavior: Behavior normal.     RECENT LABS AND TESTS: BMET    Component Value Date/Time   NA 142 07/02/2018 1230   NA 145 04/11/2017 0942   K 5.0 07/02/2018 1230   K 4.4 04/11/2017 0942   CL 100 07/02/2018 1230   CO2 22 07/02/2018 1230   CO2 26 04/11/2017 0942   GLUCOSE 118 (H) 07/02/2018 1230   GLUCOSE 221 (H) 12/22/2017 0907   GLUCOSE 156 (H) 04/11/2017 0942   BUN 24 07/02/2018 1230   BUN 15.1 04/11/2017 0942   CREATININE 0.80 07/02/2018 1230   CREATININE 0.87 10/06/2017 1010   CREATININE 0.8 04/11/2017 0942   CALCIUM 10.3 07/02/2018 1230   CALCIUM 10.0 04/11/2017 0942   GFRNONAA 77 07/02/2018 1230   GFRNONAA >60 10/06/2017 1010   GFRAA 88 07/02/2018 1230   GFRAA >60 10/06/2017 1010   Lab Results  Component Value Date   HGBA1C 6.3 (H) 07/02/2018   HGBA1C 9.5 (H) 03/26/2018   HGBA1C 8.7 (H) 12/22/2017   HGBA1C 7.5 (H) 09/22/2017  HGBA1C 7.2 (H) 05/22/2017   Lab Results  Component Value Date   INSULIN 59.3 (H) 07/02/2018   INSULIN 57.0 (H) 03/26/2018   CBC    Component Value Date/Time   WBC 9.0 03/26/2018 1024   WBC 7.5 12/22/2017 0907   RBC 4.95 03/26/2018 1024   RBC 4.87 12/22/2017 0907   HGB 12.4 03/26/2018 1024   HGB 12.2 04/11/2017 0942   HCT 40.3 03/26/2018 1024   HCT 39.1 04/11/2017 0942   PLT 282.0 12/22/2017 0907   PLT 274 10/06/2017 1010   PLT 240 04/11/2017 0942   MCV 81 03/26/2018 1024   MCV 81.6 04/11/2017 0942   MCH 25.1 (L) 03/26/2018 1024   MCH 25.6 10/06/2017 1010   MCHC 30.8 (L) 03/26/2018 1024   MCHC 31.1 12/22/2017 0907   RDW 17.1 (H) 03/26/2018 1024   RDW 19.2 (H) 04/11/2017 0942   LYMPHSABS 1.5 03/26/2018 1024    LYMPHSABS 1.4 04/11/2017 0942   MONOABS 0.5 12/22/2017 0907   MONOABS 0.3 04/11/2017 0942   EOSABS 0.0 03/26/2018 1024   BASOSABS 0.0 03/26/2018 1024   BASOSABS 0.0 04/11/2017 0942   Iron/TIBC/Ferritin/ %Sat No results found for: IRON, TIBC, FERRITIN, IRONPCTSAT Lipid Panel     Component Value Date/Time   CHOL 126 03/26/2018 1024   TRIG 188 (H) 03/26/2018 1024   HDL 34 (L) 03/26/2018 1024   CHOLHDL 3 12/22/2017 0907   VLDL 33.2 12/22/2017 0907   LDLCALC 54 03/26/2018 1024   LDLDIRECT 108.0 10/08/2016 0849   Hepatic Function Panel     Component Value Date/Time   PROT 7.1 07/02/2018 1230   PROT 7.5 04/11/2017 0942   ALBUMIN 4.1 07/02/2018 1230   ALBUMIN 3.6 04/11/2017 0942   AST 19 07/02/2018 1230   AST 33 10/06/2017 1010   AST 41 (H) 04/11/2017 0942   ALT 24 07/02/2018 1230   ALT 30 10/06/2017 1010   ALT 40 04/11/2017 0942   ALKPHOS 136 (H) 07/02/2018 1230   ALKPHOS 93 04/11/2017 0942   BILITOT 0.3 07/02/2018 1230   BILITOT 0.5 10/06/2017 1010   BILITOT 0.48 04/11/2017 0942   BILIDIR 0.15 06/19/2017 0950   IBILI 0.3 05/24/2016 0828      Component Value Date/Time   TSH 2.130 03/26/2018 1024   TSH 1.87 12/22/2017 0907   TSH 0.81 09/22/2017 0850   Results for Chiou, Jeyli A (MRN [id number]) as of 08/13/2018 17:02  Ref. Range 07/02/2018 12:30  Vitamin D, 25-Hydroxy Latest Ref Range: 30.0 - 100.0 ng/mL 31.4     OBESITY BEHAVIORAL INTERVENTION VISIT  Today's visit was # 9   Starting weight: 323 lbs Starting date: 03/26/2018 Today's weight : 277 lbs Today's date: 08/13/2018 Total lbs lost to date: 46    08/13/2018  Height 5\' 5"  (1.651 m)  Weight 277 lb (125.6 kg)  BMI (Calculated) 46.1  BLOOD PRESSURE - SYSTOLIC 967  BLOOD PRESSURE - DIASTOLIC 83   Body Fat % 89.3 %  Total Body Water (lbs) 95.6 lbs    ASK: We discussed the diagnosis of obesity with [name] today and [name] agreed to give [name] permission to discuss obesity behavioral  modification therapy today.  ASSESS: [name] has the diagnosis of obesity and her BMI today is 72.1 [name] is in the action stage of change   ADVISE: [name] was educated on the multiple health risks of obesity as well as the benefit of weight loss to improve her health. She was advised of the need for long term  treatment and the importance of lifestyle modifications to improve her current health and to decrease her risk of future health problems.  AGREE: Multiple dietary modification options and treatment options were discussed and  [place] agreed to follow the recommendations documented in the above note.  ARRANGE: [name] was educated on the importance of frequent visits to treat obesity as outlined per CMS and USPSTF guidelines and agreed to schedule her next follow up appointment today.  Corey Skains, am acting as Location manager for [employer]. Owens Shark, DO  I have reviewed the above documentation for accuracy and completeness, and I agree with the above. -Jearld Lesch, DO

## 2018-08-26 NOTE — Progress Notes (Deleted)
Subjective:   [name] is a 68 y.o. female who presents for Medicare Annual (Subsequent) preventive examination.  Review of Systems: No ROS.  Medicare Wellness Visit. Additional risk factors are reflected in the social history.   Sleep patterns: Home Safety/Smoke Alarms: Feels safe in home. Smoke alarms in place.     Female:   Pap-       Mammo-  05/14/18     Dexa scan-        CCS-last 03/28/15. Recall 7 yrs     Objective:     Vitals: There were no vitals taken for this visit.  There is no height or weight on file to calculate BMI.  Advanced Directives 05/29/2017 05/07/2017 04/22/2017 04/17/2017 01/22/2017 01/20/2017 11/28/2016  Does Patient Have a Medical Advance Directive? Yes Yes Yes Yes Yes Yes No  Type of Advance Directive Living will [place];Living will [place];Living will [place];Living will [place];Living will -  Does patient want to make changes to medical advance directive? - No - Patient declined - - No - Patient declined - -  Copy of [place] in Chart? - No - copy requested No - copy requested - No - copy requested No - copy requested -    Tobacco Social History   Tobacco Use  Smoking Status Never Smoker  Smokeless Tobacco Never Used     Counseling given: Not Answered   Clinical Intake:                       Past Medical History:  Diagnosis Date  . Anemia 04/05/2012  . Anxiety   . Anxiety state 09/11/2007   Qualifier: Diagnosis of  By: Scherrie Gerlach    . Asthma   . Atrial tachycardia, paroxysmal ([place])   . Back pain 10/02/2014  . Breast cancer ([place]) 02/17/2017  . Chicken pox as a child  . Chronic diastolic CHF (congestive heart failure), NYHA class 1 ([place])   . Complication of anesthesia    pt states feels different in her body after anesthesia when waking up and also experiences a smell of burnt plastic for approx a wk   .  Constipation   . Depression   . Diabetes ([place])   . Dilated aortic root ([place])    46mm by echo 09/2016  . Dysuria 02/17/2017  . Elevated LFTs 04/05/2012  . GERD (gastroesophageal reflux disease)   . Goiter   . Heart murmur    hx of one at birth   . History of kidney stones   . History of radiation therapy 10/31/16-12/17/16   left breast 45 Gy in 25 fractions, left breast boost 16 Gy in 8 fractions  . Hx of colonic polyps   . Hyperlipidemia   . Hypertension   . Insomnia 10/08/2016  . Joint pain   . Malignant neoplasm of upper-outer quadrant of left female breast ([place]) 05/14/2016   not with patient  . Measles as a child  . Mumps as a child  . OA (osteoarthritis) of knee 04/05/2012  . Obesity 07/11/2014  . PONV (postoperative nausea and vomiting)   . Preventative health care 09/05/2013  . PVC's (premature ventricular contractions) 05/02/2016  . Shortness of breath dyspnea    walking distances / climbing stairs  . Sleep apnea 04/05/2012  . Tinea corporis 02/23/2013  . Type 2 diabetes mellitus with hyperglycemia ([place]) 12/31/2013  . Vasomotor rhinitis  04/05/2012  . Ventral hernia   . Wheezing    Past Surgical History:  Procedure Laterality Date  . BREAST LUMPECTOMY Left 2017  . BREAST LUMPECTOMY WITH RADIOACTIVE SEED AND SENTINEL LYMPH NODE BIOPSY Left 06/21/2016   Procedure: LEFT BREAST LUMPECTOMY WITH RADIOACTIVE SEED AND SENTINEL LYMPH NODE BIOPSY, INJECT BLUE DYE LEFT BREAST;  Surgeon: Fanny Skates, MD;  Location: [facility];  Service: General;  Laterality: Left;  . CARDIAC CATHETERIZATION     normal coroary arteries per patient  . CARDIOVASCULAR STRESS TEST     10/12/2013  . CESAREAN SECTION     X 3  . CHOLECYSTECTOMY    . COLONOSCOPY WITH PROPOFOL N/A 03/28/2015   Procedure: COLONOSCOPY WITH PROPOFOL;  Surgeon: Juanita Craver, MD;  Location: [facility];  Service: Endoscopy;  Laterality: N/A;  . HERNIA REPAIR  02/08/11   ventral hernia  . JOINT REPLACEMENT     bilateral  . KNEE  ARTHROSCOPY  05/2010   bilateral  . LEFT AND RIGHT HEART CATHETERIZATION WITH CORONARY ANGIOGRAM N/A 11/08/2013   Procedure: LEFT AND RIGHT HEART CATHETERIZATION WITH CORONARY ANGIOGRAM;  Surgeon: [name], MD;  Location: [facility] CATH LAB;  Service: Cardiovascular;  Laterality: N/A;  . MENISCUS REPAIR  2009  . MOUTH SURGERY     teeth implants  . PORT-A-CATH REMOVAL N/A 01/24/2017   Procedure: REMOVAL PORT-A-CATH;  Surgeon: Fanny Skates, MD;  Location: [facility];  Service: General;  Laterality: N/A;  . PORTACATH PLACEMENT Right 07/16/2016   Procedure: INSERTION PORT-A-CATH RIGHT INTERNAL JUGULAR WITH ULTRASOUND;  Surgeon: Fanny Skates, MD;  Location: [facility];  Service: General;  Laterality: Right;  . TOTAL KNEE ARTHROPLASTY  2011   left  . TOTAL KNEE ARTHROPLASTY Right 05/10/2014   Procedure: RIGHT TOTAL KNEE ARTHROPLASTY;  Surgeon: Mauri Pole, MD;  Location: [facility];  Service: Orthopedics;  Laterality: Right;  . TUBAL LIGATION    . WISDOM TOOTH EXTRACTION  2000   Family History  Problem Relation Age of Onset  . Thyroid disease Mother   . Hyperlipidemia Mother   . Depression Mother   . Anxiety disorder Mother   . Heart attack Father 65  . Hypertension Father   . Arthritis Father        RA  . Coronary artery disease Father   . High Cholesterol Father   . Coronary artery disease Brother   . Heart disease Brother   . Cancer Maternal Aunt        colon  . Cancer Maternal Grandmother        colon  . Heart attack Maternal Grandfather   . Alcohol abuse Paternal Grandfather    Social History   Socioeconomic History  . Marital status: Divorced    Spouse name: Not on file  . Number of children: 3  . Years of education: Not on file  . Highest education level: Not on file  Occupational History  . Occupation: retired - [employer]  Social Needs  . Financial resource strain: Not on file  . Food insecurity:    Worry: Not on file    Inability: Not on file  . Transportation  needs:    Medical: Not on file    Non-medical: Not on file  Tobacco Use  . Smoking status: Never Smoker  . Smokeless tobacco: Never Used  Substance and Sexual Activity  . Alcohol use: Yes    Comment: rarely  . Drug use: No  . Sexual activity: Never    Comment:  lives with mother currently-stress  Lifestyle  . Physical activity:    Days per week: Not on file    Minutes per session: Not on file  . Stress: Not on file  Relationships  . Social connections:    Talks on phone: Not on file    Gets together: Not on file    Attends religious service: Not on file    Active member of club or organization: Not on file    Attends meetings of clubs or organizations: Not on file    Relationship status: Not on file  Other Topics Concern  . Not on file  Social History Narrative  . Not on file    Outpatient Encounter Medications as of 08/27/2018  Medication Sig  . acetaminophen (TYLENOL) 500 MG tablet Take 1,000 mg by mouth 2 (two) times daily as needed for moderate pain or headache.  . albuterol (PROVENTIL HFA;VENTOLIN HFA) 108 (90 Base) MCG/ACT inhaler Inhale 2 puffs into the lungs every 6 (six) hours as needed for wheezing or shortness of breath.  . anastrozole (ARIMIDEX) 1 MG tablet TAKE 1 TABLET (1 MG TOTAL) BY MOUTH DAILY.  Marland Kitchen aspirin EC 81 MG tablet Take 81 mg by mouth at bedtime.   Marland Kitchen BREO ELLIPTA 100-25 MCG/INH AEPB INHALE 1 PUFF INTO LUNGS ONCE A DAY  . BYSTOLIC 10 MG tablet TAKE 1 TABLET (10 MG TOTAL) BY MOUTH AT BEDTIME.  Marland Kitchen diclofenac (VOLTAREN) 75 MG EC tablet TAKE 1 TABLET TWICE DAILY  . diltiazem (CARDIZEM CD) 120 MG 24 hr capsule TAKE 1 CAPSULE (120 MG TOTAL) BY MOUTH DAILY.  Marland Kitchen escitalopram (LEXAPRO) 20 MG tablet TAKE 1 TABLET (20 MG TOTAL) BY MOUTH DAILY.  Marland Kitchen esomeprazole (NEXIUM) 40 MG capsule TAKE 1 CAPSULE EVERY DAY  . ezetimibe (ZETIA) 10 MG tablet TAKE 1 TABLET EVERY DAY  . famotidine (PEPCID) 20 MG tablet Take 1 tablet (20 mg total) by mouth 2 (two) times daily as needed  for heartburn or indigestion.  . fluticasone (FLONASE) 50 MCG/ACT nasal spray Place 2 sprays into both nostrils daily.  . furosemide (LASIX) 20 MG tablet TAKE 1 TABLET (20 MG TOTAL) BY MOUTH DAILY.  Marland Kitchen glucose blood (ACCU-CHEK AVIVA) test strip Twice daily (please use brand that patient insurance covers)  . Hypromellose (ARTIFICIAL TEARS OP) Place 1 drop into both eyes daily as needed (dry eyes).   Marland Kitchen loratadine (CLARITIN) 10 MG tablet Take 1 tablet (10 mg total) by mouth daily.  Marland Kitchen losartan (COZAAR) 100 MG tablet TAKE 1 TABLET EVERY DAY  . metFORMIN (GLUCOPHAGE) 500 MG tablet Take 1 tablet (500 mg total) by mouth 2 (two) times daily with a meal.  . montelukast (SINGULAIR) 10 MG tablet TAKE 1 TABLET (10 MG TOTAL) BY MOUTH AT BEDTIME.  . Probiotic Product (PROBIOTIC PO) Take 1 capsule by mouth at bedtime.  . ranitidine (ZANTAC) 150 MG tablet TAKE 1 TABLET (150 MG TOTAL) BY MOUTH AT BEDTIME.  . rosuvastatin (CRESTOR) 40 MG tablet TAKE 1 TABLET EVERY DAY  . Semaglutide,0.25 or 0.5MG /DOS, (OZEMPIC, 0.25 OR 0.5 MG/DOSE,) 2 MG/1.5ML SOPN Inject 0.5 mg into the skin once a week.  . Vitamin D, Ergocalciferol, (DRISDOL) 1.25 MG (50000 UT) CAPS capsule Take 1 capsule (50,000 Units total) by mouth every 7 (seven) days.   No facility-administered encounter medications on file as of 08/27/2018.     Activities of Daily Living No flowsheet data found.  Patient Care Team: [name], MD as PCP - General (Family Medicine) Fanny Skates,  MD as Consulting Physician (General Surgery) Nicholas Lose, MD as Consulting Physician (Hematology and Oncology) Gery Pray, MD as Consulting Physician (Radiation Oncology) Delice Bison, Charlestine Massed, NP as Nurse Practitioner (Hematology and Oncology) Lazaro Arms, RN as [place] Management    Assessment:   This is a routine wellness examination for [name]. Physical assessment deferred to PCP.  Exercise Activities and Dietary recommendations     Diet (meal preparation, eat out, water intake, caffeinated beverages, dairy products, fruits and vegetables): {Desc; diets:16563} Breakfast: Lunch:  Dinner:      Goals    . Increase physical activity (pt-stated)     Restart Silver Sneakers.    . Weight (lb) < 300 lb (136.1 kg) (pt-stated)     With diet        Fall Risk Fall Risk  06/29/2018 04/27/2018 02/24/2018 01/13/2018 06/30/2017  Falls in the past year? 0 0 No No No    Depression Screen PHQ 2/9 Scores 08/13/2018 07/16/2018 05/27/2018 05/07/2018  PHQ - 2 Score 0 2 0 0  PHQ- 9 Score 2 5 0 1  Exception Documentation - - - -     Cognitive Function MMSE - Mini Mental State Exam 04/22/2017  Orientation to time 5  Orientation to Place 5  Registration 3  Attention/ Calculation 5  Recall 2  Language- name 2 objects 2  Language- repeat 1  Language- follow 3 step command 3  Language- read & follow direction 1  Write a sentence 1  Copy design 1  Total score 29        Immunization History  Administered Date(s) Administered  . Influenza Split 03/24/2012, 03/24/2013  . Influenza, High Dose Seasonal PF 04/18/2016, 04/21/2017, 03/24/2018  . Influenza-Unspecified 03/21/2014, 03/15/2015, 03/18/2015  . Pneumococcal Conjugate-13 04/18/2016  . Pneumococcal Polysaccharide-23 05/22/2017  . Tdap 06/24/2008  . Zoster 04/19/2011   Screening Tests Health Maintenance  Topic Date Due  . FOOT EXAM  11/21/1960  . TETANUS/TDAP  06/24/2018  . HEMOGLOBIN A1C  12/31/2018  . OPHTHALMOLOGY EXAM  03/11/2019  . MAMMOGRAM  05/14/2020  . COLONOSCOPY  03/27/2025  . INFLUENZA VACCINE  Completed  . DEXA SCAN  Completed  . Hepatitis C Screening  Completed  . PNA vac Low Risk Adult  Completed       Plan:   ***   I have personally reviewed and noted the following in the patient's chart:   . Medical and social history . Use of alcohol, tobacco or illicit drugs  . Current medications and supplements . Functional ability and  status . Nutritional status . Physical activity . Advanced directives . List of other physicians . Hospitalizations, surgeries, and ER visits in previous 12 months . Vitals . Screenings to include cognitive, depression, and falls . Referrals and appointments  In addition, I have reviewed and discussed with patient certain preventive protocols, quality metrics, and best practice recommendations. A written personalized care plan for preventive services as well as general preventive health recommendations were provided to patient.     Shela Nevin, [place]  08/26/2018

## 2018-08-27 ENCOUNTER — Ambulatory Visit: Payer: Self-pay | Admitting: *Deleted

## 2018-08-31 ENCOUNTER — Encounter (INDEPENDENT_AMBULATORY_CARE_PROVIDER_SITE_OTHER): Payer: Self-pay | Admitting: Bariatrics

## 2018-08-31 ENCOUNTER — Ambulatory Visit (INDEPENDENT_AMBULATORY_CARE_PROVIDER_SITE_OTHER): Payer: Medicare HMO | Admitting: Bariatrics

## 2018-08-31 VITALS — BP 130/82 | HR 70 | Temp 98.2°F | Ht 65.0 in | Wt 274.0 lb

## 2018-08-31 DIAGNOSIS — E119 Type 2 diabetes mellitus without complications: Secondary | ICD-10-CM | POA: Diagnosis not present

## 2018-08-31 DIAGNOSIS — E66813 Obesity, class 3: Secondary | ICD-10-CM

## 2018-08-31 DIAGNOSIS — Z6841 Body Mass Index (BMI) 40.0 and over, adult: Secondary | ICD-10-CM

## 2018-08-31 DIAGNOSIS — E559 Vitamin D deficiency, unspecified: Secondary | ICD-10-CM | POA: Diagnosis not present

## 2018-09-01 NOTE — Progress Notes (Signed)
Office: [phone]  /  Fax: [phone]   HPI:   Chief Complaint: OBESITY [name] is here to discuss her progress with her obesity treatment plan. She is on the Category 4 plan and is following her eating plan approximately 70 % of the time. She states she is walking 5 1,000 steps daily 3 times per week. [name] is doing exceptionally well. She is not struggling at this time. Her weight is 274 lb (124.3 kg) today and has had a weight loss of 3 pounds over a period of 2 to 3 weeks since her last visit. She has lost 49 lbs since starting treatment with [name].  Diabetes II [name] has a diagnosis of diabetes type II. [name] states fasting BGs are 100 and 2 hour post prandial BGs are 115. Last A1c was at 6.3 and last insulin level was at 59.3 She has been working on intensive lifestyle modifications including diet, exercise, and weight loss to help control her blood glucose levels.  Vitamin D deficiency [name] has a diagnosis of vitamin D deficiency. She is currently taking high dose vit D and denies nausea, vomiting or muscle weakness.  ASSESSMENT AND PLAN:  Type 2 diabetes mellitus without complication, without long-term current use of insulin (HCC)  Vitamin D deficiency  Class 3 severe obesity with serious comorbidity and body mass index (BMI) of 45.0 to 49.9 in adult, unspecified obesity type ([place])  PLAN:  Diabetes II Anandi has been given extensive diabetes education by myself today including ideal fasting and post-prandial blood glucose readings, individual ideal Hgb A1c goals and hypoglycemia prevention. We discussed the importance of good blood sugar control to decrease the likelihood of diabetic complications such as nephropathy, neuropathy, limb loss, blindness, coronary artery disease, and death. We discussed the importance of intensive lifestyle modification including diet, exercise and weight loss as the first line treatment for diabetes. Derrica will continue Metformin and  Semaglutide and follow up at the agreed upon time.  Vitamin D Deficiency [name] was informed that low vitamin D levels contributes to fatigue and are associated with obesity, breast, and colon cancer. She agrees to continue to take prescription Vit D @50 ,000 IU every week and will follow up for routine testing of vitamin D, at least 2-3 times per year. She was informed of the risk of over-replacement of vitamin D and agrees to not increase her dose unless she discusses this with [name] first.  I spent > than 50% of the 15 minute visit on counseling as documented in the note.  Obesity [name] is currently in the action stage of change. As such, her goal is to continue with weight loss efforts She has agreed to follow the Category [place] has been instructed to work up to a goal of 150 minutes of combined cardio and strengthening exercise per week for weight loss and overall health benefits. We discussed the following Behavioral Modification Strategies today: increase H2O intake, no skipping meals, keeping healthy foods in the home, increasing lean protein intake, decreasing simple carbohydrates, increasing vegetables, decrease eating out, work on meal planning and easy cooking plans, ways to avoid boredom eating and decrease liquid calories  [name] has agreed to follow up with our clinic in 2 weeks. She was informed of the importance of frequent follow up visits to maximize her success with intensive lifestyle modifications for her multiple health conditions.  ALLERGIES: Allergies  Allergen Reactions  . No Known Allergies     MEDICATIONS: Current Outpatient Medications on File Prior to  Visit  Medication Sig Dispense Refill  . acetaminophen (TYLENOL) 500 MG tablet Take 1,000 mg by mouth 2 (two) times daily as needed for moderate pain or headache.    . albuterol (PROVENTIL HFA;VENTOLIN HFA) 108 (90 Base) MCG/ACT inhaler Inhale 2 puffs into the lungs every 6 (six) hours as needed for  wheezing or shortness of breath. 1 Inhaler 12  . anastrozole (ARIMIDEX) 1 MG tablet TAKE 1 TABLET (1 MG TOTAL) BY MOUTH DAILY. 90 tablet 3  . aspirin EC 81 MG tablet Take 81 mg by mouth at bedtime.     Marland Kitchen BREO ELLIPTA 100-25 MCG/INH AEPB INHALE 1 PUFF INTO LUNGS ONCE A DAY 60 each 0  . BYSTOLIC 10 MG tablet TAKE 1 TABLET (10 MG TOTAL) BY MOUTH AT BEDTIME. 90 tablet 2  . diclofenac (VOLTAREN) 75 MG EC tablet TAKE 1 TABLET TWICE DAILY 180 tablet 1  . diltiazem (CARDIZEM CD) 120 MG 24 hr capsule TAKE 1 CAPSULE (120 MG TOTAL) BY MOUTH DAILY. 90 capsule 2  . escitalopram (LEXAPRO) 20 MG tablet TAKE 1 TABLET (20 MG TOTAL) BY MOUTH DAILY. 90 tablet 1  . esomeprazole (NEXIUM) 40 MG capsule TAKE 1 CAPSULE EVERY DAY 90 capsule 2  . ezetimibe (ZETIA) 10 MG tablet TAKE 1 TABLET EVERY DAY 90 tablet 3  . famotidine (PEPCID) 20 MG tablet Take 1 tablet (20 mg total) by mouth 2 (two) times daily as needed for heartburn or indigestion. 60 tablet 3  . fluticasone (FLONASE) 50 MCG/ACT nasal spray Place 2 sprays into both nostrils daily. 16 g 6  . furosemide (LASIX) 20 MG tablet TAKE 1 TABLET (20 MG TOTAL) BY MOUTH DAILY. 90 tablet 2  . glucose blood (ACCU-CHEK AVIVA) test strip Twice daily (please use brand that patient insurance covers) 100 each 5  . Hypromellose (ARTIFICIAL TEARS OP) Place 1 drop into both eyes daily as needed (dry eyes).     Marland Kitchen loratadine (CLARITIN) 10 MG tablet Take 1 tablet (10 mg total) by mouth daily. 30 tablet 11  . losartan (COZAAR) 100 MG tablet TAKE 1 TABLET EVERY DAY 90 tablet 2  . metFORMIN (GLUCOPHAGE) 500 MG tablet Take 1 tablet (500 mg total) by mouth 2 (two) times daily with a meal. 180 tablet 3  . montelukast (SINGULAIR) 10 MG tablet TAKE 1 TABLET (10 MG TOTAL) BY MOUTH AT BEDTIME. 90 tablet 2  . Probiotic Product (PROBIOTIC PO) Take 1 capsule by mouth at bedtime.    . ranitidine (ZANTAC) 150 MG tablet TAKE 1 TABLET (150 MG TOTAL) BY MOUTH AT BEDTIME. 90 tablet 1  . rosuvastatin  (CRESTOR) 40 MG tablet TAKE 1 TABLET EVERY DAY 90 tablet 3  . Semaglutide,0.25 or 0.5MG /DOS, (OZEMPIC, 0.25 OR 0.5 MG/DOSE,) 2 MG/1.5ML SOPN Inject 0.5 mg into the skin once a week. 4 pen 0  . Vitamin D, Ergocalciferol, (DRISDOL) 1.25 MG (50000 UT) CAPS capsule Take 1 capsule (50,000 Units total) by mouth every 7 (seven) days. 2 capsule 0   No current facility-administered medications on file prior to visit.     PAST MEDICAL HISTORY: Past Medical History:  Diagnosis Date  . Anemia 04/05/2012  . Anxiety   . Anxiety state 09/11/2007   Qualifier: Diagnosis of  By: Scherrie Gerlach    . Asthma   . Atrial tachycardia, paroxysmal ([place])   . Back pain 10/02/2014  . Breast cancer ([place]) 02/17/2017  . Chicken pox as a child  . Chronic diastolic CHF (congestive heart failure), NYHA class 1 (  [place])   . Complication of anesthesia    pt states feels different in her body after anesthesia when waking up and also experiences a smell of burnt plastic for approx a wk   . Constipation   . Depression   . Diabetes ([place])   . Dilated aortic root ([place])    102mm by echo 09/2016  . Dysuria 02/17/2017  . Elevated LFTs 04/05/2012  . GERD (gastroesophageal reflux disease)   . Goiter   . Heart murmur    hx of one at birth   . History of kidney stones   . History of radiation therapy 10/31/16-12/17/16   left breast 45 Gy in 25 fractions, left breast boost 16 Gy in 8 fractions  . Hx of colonic polyps   . Hyperlipidemia   . Hypertension   . Insomnia 10/08/2016  . Joint pain   . Malignant neoplasm of upper-outer quadrant of left female breast ([place]) 05/14/2016   not with patient  . Measles as a child  . Mumps as a child  . OA (osteoarthritis) of knee 04/05/2012  . Obesity 07/11/2014  . PONV (postoperative nausea and vomiting)   . Preventative health care 09/05/2013  . PVC's (premature ventricular contractions) 05/02/2016  . Shortness of breath dyspnea    walking distances / climbing stairs  . Sleep apnea 04/05/2012   . Tinea corporis 02/23/2013  . Type 2 diabetes mellitus with hyperglycemia ([place]) 12/31/2013  . Vasomotor rhinitis 04/05/2012  . Ventral hernia   . Wheezing     PAST SURGICAL HISTORY: Past Surgical History:  Procedure Laterality Date  . BREAST LUMPECTOMY Left 2017  . BREAST LUMPECTOMY WITH RADIOACTIVE SEED AND SENTINEL LYMPH NODE BIOPSY Left 06/21/2016   Procedure: LEFT BREAST LUMPECTOMY WITH RADIOACTIVE SEED AND SENTINEL LYMPH NODE BIOPSY, INJECT BLUE DYE LEFT BREAST;  Surgeon: Fanny Skates, MD;  Location: [facility];  Service: General;  Laterality: Left;  . CARDIAC CATHETERIZATION     normal coroary arteries per patient  . CARDIOVASCULAR STRESS TEST     10/12/2013  . CESAREAN SECTION     X 3  . CHOLECYSTECTOMY    . COLONOSCOPY WITH PROPOFOL N/A 03/28/2015   Procedure: COLONOSCOPY WITH PROPOFOL;  Surgeon: Juanita Craver, MD;  Location: [facility];  Service: Endoscopy;  Laterality: N/A;  . HERNIA REPAIR  02/08/11   ventral hernia  . JOINT REPLACEMENT     bilateral  . KNEE ARTHROSCOPY  05/2010   bilateral  . LEFT AND RIGHT HEART CATHETERIZATION WITH CORONARY ANGIOGRAM N/A 11/08/2013   Procedure: LEFT AND RIGHT HEART CATHETERIZATION WITH CORONARY ANGIOGRAM;  Surgeon: [name], MD;  Location: [facility] CATH LAB;  Service: Cardiovascular;  Laterality: N/A;  . MENISCUS REPAIR  2009  . MOUTH SURGERY     teeth implants  . PORT-A-CATH REMOVAL N/A 01/24/2017   Procedure: REMOVAL PORT-A-CATH;  Surgeon: Fanny Skates, MD;  Location: [facility];  Service: General;  Laterality: N/A;  . PORTACATH PLACEMENT Right 07/16/2016   Procedure: INSERTION PORT-A-CATH RIGHT INTERNAL JUGULAR WITH ULTRASOUND;  Surgeon: Fanny Skates, MD;  Location: [facility];  Service: General;  Laterality: Right;  . TOTAL KNEE ARTHROPLASTY  2011   left  . TOTAL KNEE ARTHROPLASTY Right 05/10/2014   Procedure: RIGHT TOTAL KNEE ARTHROPLASTY;  Surgeon: Mauri Pole, MD;  Location: [facility];  Service: Orthopedics;  Laterality: Right;    . TUBAL LIGATION    . WISDOM TOOTH EXTRACTION  2000    SOCIAL HISTORY: Social History   Tobacco Use  .  Smoking status: Never Smoker  . Smokeless tobacco: Never Used  Substance Use Topics  . Alcohol use: Yes    Comment: rarely  . Drug use: No    FAMILY HISTORY: Family History  Problem Relation Age of Onset  . Thyroid disease Mother   . Hyperlipidemia Mother   . Depression Mother   . Anxiety disorder Mother   . Heart attack Father 81  . Hypertension Father   . Arthritis Father        RA  . Coronary artery disease Father   . High Cholesterol Father   . Coronary artery disease Brother   . Heart disease Brother   . Cancer Maternal Aunt        colon  . Cancer Maternal Grandmother        colon  . Heart attack Maternal Grandfather   . Alcohol abuse Paternal Grandfather     ROS: Review of Systems  Constitutional: Positive for weight loss.  Gastrointestinal: Negative for nausea and vomiting.  Musculoskeletal:       Negative for muscle weakness    PHYSICAL EXAM: Blood pressure 130/82, pulse 70, temperature 98.2 F (36.8 C), temperature source Oral, height 5\' 5"  (1.651 m), weight 274 lb (124.3 kg), SpO2 98 %. Body mass index is 45.6 kg/m. Physical Exam Vitals signs reviewed.  Constitutional:      Appearance: Normal appearance. She is well-developed. She is obese.  Cardiovascular:     Rate and Rhythm: Normal rate.  Pulmonary:     Effort: Pulmonary effort is normal.  Musculoskeletal: Normal range of motion.  Skin:    General: Skin is warm and dry.  Neurological:     Mental Status: She is alert and oriented to person, place, and time.  Psychiatric:        Mood and Affect: Mood normal.        Behavior: Behavior normal.     RECENT LABS AND TESTS: BMET    Component Value Date/Time   NA 142 07/02/2018 1230   NA 145 04/11/2017 0942   K 5.0 07/02/2018 1230   K 4.4 04/11/2017 0942   CL 100 07/02/2018 1230   CO2 22 07/02/2018 1230   CO2 26 04/11/2017 0942    GLUCOSE 118 (H) 07/02/2018 1230   GLUCOSE 221 (H) 12/22/2017 0907   GLUCOSE 156 (H) 04/11/2017 0942   BUN 24 07/02/2018 1230   BUN 15.1 04/11/2017 0942   CREATININE 0.80 07/02/2018 1230   CREATININE 0.87 10/06/2017 1010   CREATININE 0.8 04/11/2017 0942   CALCIUM 10.3 07/02/2018 1230   CALCIUM 10.0 04/11/2017 0942   GFRNONAA 77 07/02/2018 1230   GFRNONAA >60 10/06/2017 1010   GFRAA 88 07/02/2018 1230   GFRAA >60 10/06/2017 1010   Lab Results  Component Value Date   HGBA1C 6.3 (H) 07/02/2018   HGBA1C 9.5 (H) 03/26/2018   HGBA1C 8.7 (H) 12/22/2017   HGBA1C 7.5 (H) 09/22/2017   HGBA1C 7.2 (H) 05/22/2017   Lab Results  Component Value Date   INSULIN 59.3 (H) 07/02/2018   INSULIN 57.0 (H) 03/26/2018   CBC    Component Value Date/Time   WBC 9.0 03/26/2018 1024   WBC 7.5 12/22/2017 0907   RBC 4.95 03/26/2018 1024   RBC 4.87 12/22/2017 0907   HGB 12.4 03/26/2018 1024   HGB 12.2 04/11/2017 0942   HCT 40.3 03/26/2018 1024   HCT 39.1 04/11/2017 0942   PLT 282.0 12/22/2017 0907   PLT 274 10/06/2017 1010   PLT 240  04/11/2017 0942   MCV 81 03/26/2018 1024   MCV 81.6 04/11/2017 0942   MCH 25.1 (L) 03/26/2018 1024   MCH 25.6 10/06/2017 1010   MCHC 30.8 (L) 03/26/2018 1024   MCHC 31.1 12/22/2017 0907   RDW 17.1 (H) 03/26/2018 1024   RDW 19.2 (H) 04/11/2017 0942   LYMPHSABS 1.5 03/26/2018 1024   LYMPHSABS 1.4 04/11/2017 0942   MONOABS 0.5 12/22/2017 0907   MONOABS 0.3 04/11/2017 0942   EOSABS 0.0 03/26/2018 1024   BASOSABS 0.0 03/26/2018 1024   BASOSABS 0.0 04/11/2017 0942   Iron/TIBC/Ferritin/ %Sat No results found for: IRON, TIBC, FERRITIN, IRONPCTSAT Lipid Panel     Component Value Date/Time   CHOL 126 03/26/2018 1024   TRIG 188 (H) 03/26/2018 1024   HDL 34 (L) 03/26/2018 1024   CHOLHDL 3 12/22/2017 0907   VLDL 33.2 12/22/2017 0907   LDLCALC 54 03/26/2018 1024   LDLDIRECT 108.0 10/08/2016 0849   Hepatic Function Panel     Component Value Date/Time   PROT  7.1 07/02/2018 1230   PROT 7.5 04/11/2017 0942   ALBUMIN 4.1 07/02/2018 1230   ALBUMIN 3.6 04/11/2017 0942   AST 19 07/02/2018 1230   AST 33 10/06/2017 1010   AST 41 (H) 04/11/2017 0942   ALT 24 07/02/2018 1230   ALT 30 10/06/2017 1010   ALT 40 04/11/2017 0942   ALKPHOS 136 (H) 07/02/2018 1230   ALKPHOS 93 04/11/2017 0942   BILITOT 0.3 07/02/2018 1230   BILITOT 0.5 10/06/2017 1010   BILITOT 0.48 04/11/2017 0942   BILIDIR 0.15 06/19/2017 0950   IBILI 0.3 05/24/2016 0828      Component Value Date/Time   TSH 2.130 03/26/2018 1024   TSH 1.87 12/22/2017 0907   TSH 0.81 09/22/2017 0850     Ref. Range 07/02/2018 12:30  Vitamin D, 25-Hydroxy Latest Ref Range: 30.0 - 100.0 ng/mL 31.4     OBESITY BEHAVIORAL INTERVENTION VISIT  Today's visit was # 10   Starting weight: 323 lbs Starting date: 03/26/2018 Today's weight : 274 lbs Today's date: 08/31/2018 Total lbs lost to date: 49    08/31/2018  Height 5\' 5"  (1.651 m)  Weight 274 lb (124.3 kg)  BMI (Calculated) 45.6  BLOOD PRESSURE - SYSTOLIC 130  BLOOD PRESSURE - DIASTOLIC 82   Body Fat % 86.5 %     ASK: We discussed the diagnosis of obesity with [name] today and Sarahmarie agreed to give [name] permission to discuss obesity behavioral modification therapy today.  ASSESS: [name] has the diagnosis of obesity and her BMI today is 45.6 [name] is in the action stage of change   ADVISE: [name] was educated on the multiple health risks of obesity as well as the benefit of weight loss to improve her health. She was advised of the need for long term treatment and the importance of lifestyle modifications to improve her current health and to decrease her risk of future health problems.  AGREE: Multiple dietary modification options and treatment options were discussed and  [name] agreed to follow the recommendations documented in the above note.  ARRANGE: Kharter was educated on the importance of frequent visits to treat  obesity as outlined per CMS and USPSTF guidelines and agreed to schedule her next follow up appointment today.  Corey Skains, am acting as Location manager for [employer]. Owens Shark, DO  I have reviewed the above documentation for accuracy and completeness, and I agree with the above. -Jearld Lesch, DO

## 2018-09-13 ENCOUNTER — Encounter (INDEPENDENT_AMBULATORY_CARE_PROVIDER_SITE_OTHER): Payer: Self-pay | Admitting: Bariatrics

## 2018-09-14 ENCOUNTER — Encounter (INDEPENDENT_AMBULATORY_CARE_PROVIDER_SITE_OTHER): Payer: Self-pay

## 2018-09-14 ENCOUNTER — Ambulatory Visit (INDEPENDENT_AMBULATORY_CARE_PROVIDER_SITE_OTHER): Payer: Self-pay | Admitting: Bariatrics

## 2018-09-15 ENCOUNTER — Encounter (INDEPENDENT_AMBULATORY_CARE_PROVIDER_SITE_OTHER): Payer: Self-pay | Admitting: Family Medicine

## 2018-09-15 ENCOUNTER — Ambulatory Visit (INDEPENDENT_AMBULATORY_CARE_PROVIDER_SITE_OTHER): Payer: Medicare HMO | Admitting: Family Medicine

## 2018-09-15 ENCOUNTER — Encounter (INDEPENDENT_AMBULATORY_CARE_PROVIDER_SITE_OTHER): Payer: Self-pay

## 2018-09-15 ENCOUNTER — Other Ambulatory Visit: Payer: Self-pay

## 2018-09-15 DIAGNOSIS — Z6841 Body Mass Index (BMI) 40.0 and over, adult: Secondary | ICD-10-CM | POA: Diagnosis not present

## 2018-09-15 DIAGNOSIS — E119 Type 2 diabetes mellitus without complications: Secondary | ICD-10-CM

## 2018-09-15 NOTE — Progress Notes (Signed)
Office: [phone]  /  Fax: [phone] TeleHealth Visit:  @NAME @ has verbally consented to this TeleHealth visit today. The patient is located at home, the provider is located at the [employer] [facility] office. The participants in this visit include the listed provider and patient and . The visit was conducted today via telephone due to lack of video access by patient.   HPI:   Chief Complaint: OBESITY [name] is here to discuss her progress with her obesity treatment plan. She is on the Category 4 plan and is following her eating plan approximately 80 % of the time. She states she is walking for 10 minutes 3 times per week. [name]'s visit is via face time. She continues to do well with her Category 4 plan. She is isolated at home, but talks with multiple family members daily. Her hunger is controlled but she has done more boredom eating, but still mostly of good food choices. She feels she probably has lost a little weight, but she is not weighing often at home.   Diabetes II [name] has a diagnosis of diabetes type II. [name] continues to do very well with her diet and BGs control. She states her fasting BGs range approximately 110 on average and she denies nausea, vomiting, or hypoglycemia. She has increased snacking but minimizes her simple carbohydrates. She has been working on intensive lifestyle modifications including diet, exercise, and weight loss to help control her blood glucose levels.  ASSESSMENT AND PLAN:  Type 2 diabetes mellitus without complication, without long-term current use of insulin (HCC)  Class 3 severe obesity with serious comorbidity and body mass index (BMI) of 45.0 to 49.9 in adult, unspecified obesity type ([place])  PLAN:  Diabetes II [name] has been given extensive diabetes education by myself today including ideal fasting and post-prandial blood glucose readings, individual ideal Hgb A1c goals and hypoglycemia prevention. We discussed the  importance of good blood sugar control to decrease the likelihood of diabetic complications such as nephropathy, neuropathy, limb loss, blindness, coronary artery disease, and death. We discussed the importance of intensive lifestyle modification including diet, exercise and weight loss as the first line treatment for diabetes. [name] agrees to continue her diet, exercise, and diabetes medications and we will recheck labs in 1 month. Solangel agrees to follow up with our clinic in 2 weeks.  I spent > than 50% of the 15 minute visit on counseling as documented in the note.  Obesity [name] is currently in the action stage of change. As such, her goal is to continue with weight loss efforts She has agreed to follow the Category [name] has been instructed to work up to a goal of 150 minutes of combined cardio and strengthening exercise per week for weight loss and overall health benefits. We discussed the following Behavioral Modification Strategies today: work on meal planning and easy cooking plans, emotional eating strategies, ways to avoid boredom eating, ways to avoid night time snacking, keeping healthy foods in the home, and better snacking choices   [name] has agreed to follow up with our clinic in 2 weeks. She was informed of the importance of frequent follow up visits to maximize her success with intensive lifestyle modifications for her multiple health conditions.  ALLERGIES: Allergies  Allergen Reactions  . No Known Allergies     MEDICATIONS: Current Outpatient Medications on File Prior to Visit  Medication Sig Dispense Refill  . acetaminophen (TYLENOL) 500 MG tablet Take 1,000 mg by mouth 2 (two) times  daily as needed for moderate pain or headache.    . albuterol (PROVENTIL HFA;VENTOLIN HFA) 108 (90 Base) MCG/ACT inhaler Inhale 2 puffs into the lungs every 6 (six) hours as needed for wheezing or shortness of breath. 1 Inhaler 12  . anastrozole (ARIMIDEX) 1 MG tablet TAKE 1  TABLET (1 MG TOTAL) BY MOUTH DAILY. 90 tablet 3  . aspirin EC 81 MG tablet Take 81 mg by mouth at bedtime.     Marland Kitchen BREO ELLIPTA 100-25 MCG/INH AEPB INHALE 1 PUFF INTO LUNGS ONCE A DAY 60 each 0  . BYSTOLIC 10 MG tablet TAKE 1 TABLET (10 MG TOTAL) BY MOUTH AT BEDTIME. 90 tablet 2  . diclofenac (VOLTAREN) 75 MG EC tablet TAKE 1 TABLET TWICE DAILY 180 tablet 1  . diltiazem (CARDIZEM CD) 120 MG 24 hr capsule TAKE 1 CAPSULE (120 MG TOTAL) BY MOUTH DAILY. 90 capsule 2  . escitalopram (LEXAPRO) 20 MG tablet TAKE 1 TABLET (20 MG TOTAL) BY MOUTH DAILY. 90 tablet 1  . esomeprazole (NEXIUM) 40 MG capsule TAKE 1 CAPSULE EVERY DAY 90 capsule 2  . ezetimibe (ZETIA) 10 MG tablet TAKE 1 TABLET EVERY DAY 90 tablet 3  . famotidine (PEPCID) 20 MG tablet Take 1 tablet (20 mg total) by mouth 2 (two) times daily as needed for heartburn or indigestion. 60 tablet 3  . fluticasone (FLONASE) 50 MCG/ACT nasal spray Place 2 sprays into both nostrils daily. 16 g 6  . furosemide (LASIX) 20 MG tablet TAKE 1 TABLET (20 MG TOTAL) BY MOUTH DAILY. 90 tablet 2  . glucose blood (ACCU-CHEK AVIVA) test strip Twice daily (please use brand that patient insurance covers) 100 each 5  . Hypromellose (ARTIFICIAL TEARS OP) Place 1 drop into both eyes daily as needed (dry eyes).     Marland Kitchen loratadine (CLARITIN) 10 MG tablet Take 1 tablet (10 mg total) by mouth daily. 30 tablet 11  . losartan (COZAAR) 100 MG tablet TAKE 1 TABLET EVERY DAY 90 tablet 2  . metFORMIN (GLUCOPHAGE) 500 MG tablet Take 1 tablet (500 mg total) by mouth 2 (two) times daily with a meal. 180 tablet 3  . montelukast (SINGULAIR) 10 MG tablet TAKE 1 TABLET (10 MG TOTAL) BY MOUTH AT BEDTIME. 90 tablet 2  . Probiotic Product (PROBIOTIC PO) Take 1 capsule by mouth at bedtime.    . ranitidine (ZANTAC) 150 MG tablet TAKE 1 TABLET (150 MG TOTAL) BY MOUTH AT BEDTIME. 90 tablet 1  . rosuvastatin (CRESTOR) 40 MG tablet TAKE 1 TABLET EVERY DAY 90 tablet 3  . Semaglutide,0.25 or 0.5MG /DOS,  (OZEMPIC, 0.25 OR 0.5 MG/DOSE,) 2 MG/1.5ML SOPN Inject 0.5 mg into the skin once a week. 4 pen 0  . Vitamin D, Ergocalciferol, (DRISDOL) 1.25 MG (50000 UT) CAPS capsule Take 1 capsule (50,000 Units total) by mouth every 7 (seven) days. 2 capsule 0   No current facility-administered medications on file prior to visit.     PAST MEDICAL HISTORY: Past Medical History:  Diagnosis Date  . Anemia 04/05/2012  . Anxiety   . Anxiety state 09/11/2007   Qualifier: Diagnosis of  By: Scherrie Gerlach    . Asthma   . Atrial tachycardia, paroxysmal ([place])   . Back pain 10/02/2014  . Breast cancer ([place]) 02/17/2017  . Chicken pox as a child  . Chronic diastolic CHF (congestive heart failure), NYHA class 1 ([place])   . Complication of anesthesia    pt states feels different in her body after anesthesia when waking  up and also experiences a smell of burnt plastic for approx a wk   . Constipation   . Depression   . Diabetes ([place])   . Dilated aortic root ([place])    45mm by echo 09/2016  . Dysuria 02/17/2017  . Elevated LFTs 04/05/2012  . GERD (gastroesophageal reflux disease)   . Goiter   . Heart murmur    hx of one at birth   . History of kidney stones   . History of radiation therapy 10/31/16-12/17/16   left breast 45 Gy in 25 fractions, left breast boost 16 Gy in 8 fractions  . Hx of colonic polyps   . Hyperlipidemia   . Hypertension   . Insomnia 10/08/2016  . Joint pain   . Malignant neoplasm of upper-outer quadrant of left female breast ([place]) 05/14/2016   not with patient  . Measles as a child  . Mumps as a child  . OA (osteoarthritis) of knee 04/05/2012  . Obesity 07/11/2014  . PONV (postoperative nausea and vomiting)   . Preventative health care 09/05/2013  . PVC's (premature ventricular contractions) 05/02/2016  . Shortness of breath dyspnea    walking distances / climbing stairs  . Sleep apnea 04/05/2012  . Tinea corporis 02/23/2013  . Type 2 diabetes mellitus with hyperglycemia ([place]) 12/31/2013   . Vasomotor rhinitis 04/05/2012  . Ventral hernia   . Wheezing     PAST SURGICAL HISTORY: Past Surgical History:  Procedure Laterality Date  . BREAST LUMPECTOMY Left 2017  . BREAST LUMPECTOMY WITH RADIOACTIVE SEED AND SENTINEL LYMPH NODE BIOPSY Left 06/21/2016   Procedure: LEFT BREAST LUMPECTOMY WITH RADIOACTIVE SEED AND SENTINEL LYMPH NODE BIOPSY, INJECT BLUE DYE LEFT BREAST;  Surgeon: Fanny Skates, MD;  Location: [facility];  Service: General;  Laterality: Left;  . CARDIAC CATHETERIZATION     normal coroary arteries per patient  . CARDIOVASCULAR STRESS TEST     10/12/2013  . CESAREAN SECTION     X 3  . CHOLECYSTECTOMY    . COLONOSCOPY WITH PROPOFOL N/A 03/28/2015   Procedure: COLONOSCOPY WITH PROPOFOL;  Surgeon: Juanita Craver, MD;  Location: [facility];  Service: Endoscopy;  Laterality: N/A;  . HERNIA REPAIR  02/08/11   ventral hernia  . JOINT REPLACEMENT     bilateral  . KNEE ARTHROSCOPY  05/2010   bilateral  . LEFT AND RIGHT HEART CATHETERIZATION WITH CORONARY ANGIOGRAM N/A 11/08/2013   Procedure: LEFT AND RIGHT HEART CATHETERIZATION WITH CORONARY ANGIOGRAM;  Surgeon: [name], MD;  [facility];  Service: Cardiovascular;  Laterality: N/A;  . MENISCUS REPAIR  2009  . MOUTH SURGERY     teeth implants  . PORT-A-CATH REMOVAL N/A 01/24/2017   Procedure: REMOVAL PORT-A-CATH;  Surgeon: Fanny Skates, MD;  Location: [facility];  Service: General;  Laterality: N/A;  . PORTACATH PLACEMENT Right 07/16/2016   Procedure: INSERTION PORT-A-CATH RIGHT INTERNAL JUGULAR WITH ULTRASOUND;  Surgeon: Fanny Skates, MD;  Location: [facility];  Service: General;  Laterality: Right;  . TOTAL KNEE ARTHROPLASTY  2011   left  . TOTAL KNEE ARTHROPLASTY Right 05/10/2014   Procedure: RIGHT TOTAL KNEE ARTHROPLASTY;  Surgeon: Mauri Pole, MD;  Location: [facility];  Service: Orthopedics;  Laterality: Right;  . TUBAL LIGATION    . WISDOM TOOTH EXTRACTION  2000    SOCIAL HISTORY: Social History    Tobacco Use  . Smoking status: Never Smoker  . Smokeless tobacco: Never Used  Substance Use Topics  . Alcohol use: Yes  Comment: rarely  . Drug use: No    FAMILY HISTORY: Family History  Problem Relation Age of Onset  . Thyroid disease Mother   . Hyperlipidemia Mother   . Depression Mother   . Anxiety disorder Mother   . Heart attack Father 40  . Hypertension Father   . Arthritis Father        RA  . Coronary artery disease Father   . High Cholesterol Father   . Coronary artery disease Brother   . Heart disease Brother   . Cancer Maternal Aunt        colon  . Cancer Maternal Grandmother        colon  . Heart attack Maternal Grandfather   . Alcohol abuse Paternal Grandfather     ROS: Review of Systems  Constitutional: Positive for weight loss.  Gastrointestinal: Negative for nausea and vomiting.  Endo/Heme/Allergies:       Negative hypoglycemia    PHYSICAL EXAM: There were no vitals taken for this visit. There is no height or weight on file to calculate BMI. Physical Exam  RECENT LABS AND TESTS: BMET    Component Value Date/Time   NA 142 07/02/2018 1230   NA 145 04/11/2017 0942   K 5.0 07/02/2018 1230   K 4.4 04/11/2017 0942   CL 100 07/02/2018 1230   CO2 22 07/02/2018 1230   CO2 26 04/11/2017 0942   GLUCOSE 118 (H) 07/02/2018 1230   GLUCOSE 221 (H) 12/22/2017 0907   GLUCOSE 156 (H) 04/11/2017 0942   BUN 24 07/02/2018 1230   BUN 15.1 04/11/2017 0942   CREATININE 0.80 07/02/2018 1230   CREATININE 0.87 10/06/2017 1010   CREATININE 0.8 04/11/2017 0942   CALCIUM 10.3 07/02/2018 1230   CALCIUM 10.0 04/11/2017 0942   GFRNONAA 77 07/02/2018 1230   GFRNONAA >60 10/06/2017 1010   GFRAA 88 07/02/2018 1230   GFRAA >60 10/06/2017 1010   Lab Results  Component Value Date   HGBA1C 6.3 (H) 07/02/2018   HGBA1C 9.5 (H) 03/26/2018   HGBA1C 8.7 (H) 12/22/2017   HGBA1C 7.5 (H) 09/22/2017   HGBA1C 7.2 (H) 05/22/2017   Lab Results  Component Value Date    INSULIN 59.3 (H) 07/02/2018   INSULIN 57.0 (H) 03/26/2018   CBC    Component Value Date/Time   WBC 9.0 03/26/2018 1024   WBC 7.5 12/22/2017 0907   RBC 4.95 03/26/2018 1024   RBC 4.87 12/22/2017 0907   HGB 12.4 03/26/2018 1024   HGB 12.2 04/11/2017 0942   HCT 40.3 03/26/2018 1024   HCT 39.1 04/11/2017 0942   PLT 282.0 12/22/2017 0907   PLT 274 10/06/2017 1010   PLT 240 04/11/2017 0942   MCV 81 03/26/2018 1024   MCV 81.6 04/11/2017 0942   MCH 25.1 (L) 03/26/2018 1024   MCH 25.6 10/06/2017 1010   MCHC 30.8 (L) 03/26/2018 1024   MCHC 31.1 12/22/2017 0907   RDW 17.1 (H) 03/26/2018 1024   RDW 19.2 (H) 04/11/2017 0942   LYMPHSABS 1.5 03/26/2018 1024   LYMPHSABS 1.4 04/11/2017 0942   MONOABS 0.5 12/22/2017 0907   MONOABS 0.3 04/11/2017 0942   EOSABS 0.0 03/26/2018 1024   BASOSABS 0.0 03/26/2018 1024   BASOSABS 0.0 04/11/2017 0942   Iron/TIBC/Ferritin/ %Sat No results found for: IRON, TIBC, FERRITIN, IRONPCTSAT Lipid Panel     Component Value Date/Time   CHOL 126 03/26/2018 1024   TRIG 188 (H) 03/26/2018 1024   HDL 34 (L) 03/26/2018 1024   CHOLHDL  3 12/22/2017 0907   VLDL 33.2 12/22/2017 0907   LDLCALC 54 03/26/2018 1024   LDLDIRECT 108.0 10/08/2016 0849   Hepatic Function Panel     Component Value Date/Time   PROT 7.1 07/02/2018 1230   PROT 7.5 04/11/2017 0942   ALBUMIN 4.1 07/02/2018 1230   ALBUMIN 3.6 04/11/2017 0942   AST 19 07/02/2018 1230   AST 33 10/06/2017 1010   AST 41 (H) 04/11/2017 0942   ALT 24 07/02/2018 1230   ALT 30 10/06/2017 1010   ALT 40 04/11/2017 0942   ALKPHOS 136 (H) 07/02/2018 1230   ALKPHOS 93 04/11/2017 0942   BILITOT 0.3 07/02/2018 1230   BILITOT 0.5 10/06/2017 1010   BILITOT 0.48 04/11/2017 0942   BILIDIR 0.15 06/19/2017 0950   IBILI 0.3 05/24/2016 0828      Component Value Date/Time   TSH 2.130 03/26/2018 1024   TSH 1.87 12/22/2017 0907   TSH 0.81 09/22/2017 0850      OBESITY BEHAVIORAL INTERVENTION VISIT  Today's visit  was # 11   Starting weight: 323 lbs Starting date: 03/26/18 Today's weight : N/A  Today's date: 09/15/2018 Total lbs lost to date: N/A    ASK: We discussed the diagnosis of obesity with [name] today and Kamorie agreed to give [name] permission to discuss obesity behavioral modification therapy today.  ASSESS: Cynitha has the diagnosis of obesity and her BMI today is N/A Marwah is in the action stage of change   ADVISE: Myesha was educated on the multiple health risks of obesity as well as the benefit of weight loss to improve her health. She was advised of the need for long term treatment and the importance of lifestyle modifications to improve her current health and to decrease her risk of future health problems.  AGREE: Multiple dietary modification options and treatment options were discussed and  Dalyn agreed to follow the recommendations documented in the above note.  ARRANGE: Baby was educated on the importance of frequent visits to treat obesity as outlined per CMS and USPSTF guidelines and agreed to schedule her next follow up appointment today.  I, Trixie Dredge, am acting as transcriptionist for Dennard Nip, MD  I have reviewed the above documentation for accuracy and completeness, and I agree with the above. -Dennard Nip, MD

## 2018-09-17 ENCOUNTER — Other Ambulatory Visit (INDEPENDENT_AMBULATORY_CARE_PROVIDER_SITE_OTHER): Payer: Self-pay | Admitting: Bariatrics

## 2018-09-17 DIAGNOSIS — E119 Type 2 diabetes mellitus without complications: Secondary | ICD-10-CM

## 2018-09-17 NOTE — Progress Notes (Addendum)
I connected with [name] on 09/18/18 at  9:00 AM EDT by a video enabled telemedicine application and verified that I am speaking with the correct person using two identifiers.   Subjective:   [name] is a 68 y.o. female who presents for Medicare Annual (Subsequent) preventive examination.  Been doing healthy weight management since 04/2018. Lost 50 lbs. Mother passed one year ago.  Review of Systems: No ROS.  Medicare Wellness Visit. Additional risk factors are reflected in the social history. Cardiac Risk Factors include: advanced age (>62men, >81 women);diabetes mellitus;dyslipidemia;hypertension;obesity (BMI >30kg/m2) Sleep patterns: wears CPAP.  Home Safety/Smoke Alarms: Feels safe in home. Smoke alarms in place. Lives alone. Eye- wears glasses. Yearly for DM exam.    Female:      Mammo-  05/14/18     Dexa scan- 04/29/16        CCS-03/28/15. 7 yr recall     Objective:     Vitals:uta virtual visit  Advanced Directives 09/18/2018 05/29/2017 05/07/2017 04/22/2017 04/17/2017 01/22/2017 01/20/2017  Does Patient Have a Medical Advance Directive? Yes Yes Yes Yes Yes Yes Yes  Type of Paramedic of [place];Living will Living will [place];Living will [place];Living will - [place];Living will [place];Living will  Does patient want to make changes to medical advance directive? No - Patient declined - No - Patient declined - - No - Patient declined -  Copy of [place] in Chart? No - copy requested - No - copy requested No - copy requested - No - copy requested No - copy requested    Tobacco Social History   Tobacco Use  Smoking Status Never Smoker  Smokeless Tobacco Never Used     Counseling given: Not Answered   Clinical Intake:     Pain : No/denies pain                 Past Medical History:  Diagnosis Date  . Anemia  04/05/2012  . Anxiety   . Anxiety state 09/11/2007   Qualifier: Diagnosis of  By: Scherrie Gerlach    . Asthma   . Atrial tachycardia, paroxysmal ([place])   . Back pain 10/02/2014  . Breast cancer ([place]) 02/17/2017  . Chicken pox as a child  . Chronic diastolic CHF (congestive heart failure), NYHA class 1 ([place])   . Complication of anesthesia    pt states feels different in her body after anesthesia when waking up and also experiences a smell of burnt plastic for approx a wk   . Constipation   . Depression   . Diabetes ([place])   . Dilated aortic root ([place])    76mm by echo 09/2016  . Dysuria 02/17/2017  . Elevated LFTs 04/05/2012  . GERD (gastroesophageal reflux disease)   . Goiter   . Heart murmur    hx of one at birth   . History of kidney stones   . History of radiation therapy 10/31/16-12/17/16   left breast 45 Gy in 25 fractions, left breast boost 16 Gy in 8 fractions  . Hx of colonic polyps   . Hyperlipidemia   . Hypertension   . Insomnia 10/08/2016  . Joint pain   . Malignant neoplasm of upper-outer quadrant of left female breast ([place]) 05/14/2016   not with patient  . Measles as a child  . Mumps as a child  . OA (osteoarthritis) of knee 04/05/2012  . Obesity  07/11/2014  . PONV (postoperative nausea and vomiting)   . Preventative health care 09/05/2013  . PVC's (premature ventricular contractions) 05/02/2016  . Shortness of breath dyspnea    walking distances / climbing stairs  . Sleep apnea 04/05/2012  . Tinea corporis 02/23/2013  . Type 2 diabetes mellitus with hyperglycemia ([place]) 12/31/2013  . Vasomotor rhinitis 04/05/2012  . Ventral hernia   . Wheezing    Past Surgical History:  Procedure Laterality Date  . BREAST LUMPECTOMY Left 2017  . BREAST LUMPECTOMY WITH RADIOACTIVE SEED AND SENTINEL LYMPH NODE BIOPSY Left 06/21/2016   Procedure: LEFT BREAST LUMPECTOMY WITH RADIOACTIVE SEED AND SENTINEL LYMPH NODE BIOPSY, INJECT BLUE DYE LEFT BREAST;  Surgeon: Fanny Skates, MD;   Location: [facility];  Service: General;  Laterality: Left;  . CARDIAC CATHETERIZATION     normal coroary arteries per patient  . CARDIOVASCULAR STRESS TEST     10/12/2013  . CESAREAN SECTION     X 3  . CHOLECYSTECTOMY    . COLONOSCOPY WITH PROPOFOL N/A 03/28/2015   Procedure: COLONOSCOPY WITH PROPOFOL;  Surgeon: Juanita Craver, MD;  Location: [facility];  Service: Endoscopy;  Laterality: N/A;  . HERNIA REPAIR  02/08/11   ventral hernia  . JOINT REPLACEMENT     bilateral  . KNEE ARTHROSCOPY  05/2010   bilateral  . LEFT AND RIGHT HEART CATHETERIZATION WITH CORONARY ANGIOGRAM N/A 11/08/2013   Procedure: LEFT AND RIGHT HEART CATHETERIZATION WITH CORONARY ANGIOGRAM;  Surgeon: [name], MD;  Location[facility] CATH LAB;  Service: Cardiovascular;  Laterality: N/A;  . MENISCUS REPAIR  2009  . MOUTH SURGERY     teeth implants  . PORT-A-CATH REMOVAL N/A 01/24/2017   Procedure: REMOVAL PORT-A-CATH;  Surgeon: Fanny Skates, MD;  Location: [facility];  Service: General;  Laterality: N/A;  . PORTACATH PLACEMENT Right 07/16/2016   Procedure: INSERTION PORT-A-CATH RIGHT INTERNAL JUGULAR WITH ULTRASOUND;  Surgeon: Fanny Skates, MD;  Location: [facility] [place];  Service: General;  Laterality: Right;  . TOTAL KNEE ARTHROPLASTY  2011   left  . TOTAL KNEE ARTHROPLASTY Right 05/10/2014   Procedure: RIGHT TOTAL KNEE ARTHROPLASTY;  Surgeon: Mauri Pole, MD;  Location: [facility];  Service: Orthopedics;  Laterality: Right;  . TUBAL LIGATION    . WISDOM TOOTH EXTRACTION  2000   Family History  Problem Relation Age of Onset  . Thyroid disease Mother   . Hyperlipidemia Mother   . Depression Mother   . Anxiety disorder Mother   . Heart attack Father 19  . Hypertension Father   . Arthritis Father        RA  . Coronary artery disease Father   . High Cholesterol Father   . Coronary artery disease Brother   . Heart disease Brother   . Cancer Maternal Aunt        colon  . Cancer Maternal Grandmother        colon  .  Heart attack Maternal Grandfather   . Alcohol abuse Paternal Grandfather    Social History   Socioeconomic History  . Marital status: Divorced    Spouse name: Not on file  . Number of children: 3  . Years of education: Not on file  . Highest education level: Not on file  Occupational History  . Occupation: retired - [employer]  Social Needs  . Financial resource strain: Not on file  . Food insecurity:    Worry: Not on file    Inability: Not on file  .  Transportation needs:    Medical: Not on file    Non-medical: Not on file  Tobacco Use  . Smoking status: Never Smoker  . Smokeless tobacco: Never Used  Substance and Sexual Activity  . Alcohol use: Not Currently    Comment: rarely  . Drug use: No  . Sexual activity: Never    Comment: lives with mother currently-stress  Lifestyle  . Physical activity:    Days per week: Not on file    Minutes per session: Not on file  . Stress: Not on file  Relationships  . Social connections:    Talks on phone: Not on file    Gets together: Not on file    Attends religious service: Not on file    Active member of club or organization: Not on file    Attends meetings of clubs or organizations: Not on file    Relationship status: Not on file  Other Topics Concern  . Not on file  Social History Narrative  . Not on file    Outpatient Encounter Medications as of 09/18/2018  Medication Sig  . acetaminophen (TYLENOL) 500 MG tablet Take 1,000 mg by mouth 2 (two) times daily as needed for moderate pain or headache.  . albuterol (PROVENTIL HFA;VENTOLIN HFA) 108 (90 Base) MCG/ACT inhaler Inhale 2 puffs into the lungs every 6 (six) hours as needed for wheezing or shortness of breath.  . anastrozole (ARIMIDEX) 1 MG tablet TAKE 1 TABLET (1 MG TOTAL) BY MOUTH DAILY.  Marland Kitchen aspirin EC 81 MG tablet Take 81 mg by mouth at bedtime.   Marland Kitchen BREO ELLIPTA 100-25 MCG/INH AEPB INHALE 1 PUFF INTO LUNGS ONCE A DAY  . BYSTOLIC 10 MG tablet TAKE 1 TABLET (10 MG  TOTAL) BY MOUTH AT BEDTIME.  Marland Kitchen diclofenac (VOLTAREN) 75 MG EC tablet TAKE 1 TABLET TWICE DAILY  . diltiazem (CARDIZEM CD) 120 MG 24 hr capsule TAKE 1 CAPSULE (120 MG TOTAL) BY MOUTH DAILY.  Marland Kitchen escitalopram (LEXAPRO) 20 MG tablet TAKE 1 TABLET (20 MG TOTAL) BY MOUTH DAILY.  Marland Kitchen esomeprazole (NEXIUM) 40 MG capsule TAKE 1 CAPSULE EVERY DAY  . ezetimibe (ZETIA) 10 MG tablet TAKE 1 TABLET EVERY DAY  . fluticasone (FLONASE) 50 MCG/ACT nasal spray Place 2 sprays into both nostrils daily.  . furosemide (LASIX) 20 MG tablet TAKE 1 TABLET (20 MG TOTAL) BY MOUTH DAILY.  Marland Kitchen glucose blood (ACCU-CHEK AVIVA) test strip Twice daily (please use brand that patient insurance covers)  . Hypromellose (ARTIFICIAL TEARS OP) Place 1 drop into both eyes daily as needed (dry eyes).   Marland Kitchen loratadine (CLARITIN) 10 MG tablet Take 1 tablet (10 mg total) by mouth daily.  Marland Kitchen losartan (COZAAR) 100 MG tablet TAKE 1 TABLET EVERY DAY  . metFORMIN (GLUCOPHAGE) 500 MG tablet Take 1 tablet (500 mg total) by mouth 2 (two) times daily with a meal.  . montelukast (SINGULAIR) 10 MG tablet TAKE 1 TABLET (10 MG TOTAL) BY MOUTH AT BEDTIME.  . Probiotic Product (PROBIOTIC PO) Take 1 capsule by mouth at bedtime.  . ranitidine (ZANTAC) 150 MG tablet TAKE 1 TABLET (150 MG TOTAL) BY MOUTH AT BEDTIME.  . rosuvastatin (CRESTOR) 40 MG tablet TAKE 1 TABLET EVERY DAY  . Semaglutide,0.25 or 0.5MG /DOS, (OZEMPIC, 0.25 OR 0.5 MG/DOSE,) 2 MG/1.5ML SOPN Inject 0.5 mg into the skin once a week.  . Vitamin D, Ergocalciferol, (DRISDOL) 1.25 MG (50000 UT) CAPS capsule Take 1 capsule (50,000 Units total) by mouth every 7 (seven) days.  . [DISCONTINUED]  famotidine (PEPCID) 20 MG tablet Take 1 tablet (20 mg total) by mouth 2 (two) times daily as needed for heartburn or indigestion. (Patient not taking: Reported on 09/18/2018)   No facility-administered encounter medications on file as of 09/18/2018.     Activities of Daily Living In your present state of health, do  you have any difficulty performing the following activities: 09/18/2018  Hearing? N  Vision? N  Difficulty concentrating or making decisions? N  Walking or climbing stairs? N  Dressing or bathing? N  Doing errands, shopping? N  Preparing Food and eating ? N  Using the Toilet? N  In the past six months, have you accidently leaked urine? N  Do you have problems with loss of bowel control? N  Managing your Medications? N  Managing your Finances? N  Housekeeping or managing your Housekeeping? N  Some recent data might be hidden    Patient Care Team: [name], MD as PCP - General (Family Medicine) Fanny Skates, MD as Consulting Physician (General Surgery) Nicholas Lose, MD as Consulting Physician (Hematology and Oncology) Gery Pray, MD as Consulting Physician (Radiation Oncology) Delice Bison, Charlestine Massed, NP as Nurse Practitioner (Hematology and Oncology) Lazaro Arms, RN as [facility]    Assessment:   This is a routine wellness examination for [place]. Physical assessment deferred to PCP.  Exercise Activities and Dietary recommendations Current Exercise Habits: Home exercise routine, Type of exercise: walking, Time (Minutes): 10, Frequency (Times/Week): 3, Weekly Exercise (Minutes/Week): 30, Intensity: Mild, Exercise limited by: None identified   Diet (meal preparation, eat out, water intake, caffeinated beverages, dairy products, fruits and vegetables): 3 meals per day. Category 4 with weight management.  Water- drinks several bottles per day.   Goals    . Increase physical activity (pt-stated)     Restart Silver Sneakers.    . Weight (lb) < 300 lb (136.1 kg) (pt-stated)     With diet        Fall Risk Fall Risk  09/18/2018 06/29/2018 04/27/2018 02/24/2018 01/13/2018  Falls in the past year? 1 0 0 No No  Number falls in past yr: 0 - - - -  Injury with Fall? 0 - - - -    Depression Screen PHQ 2/9 Scores 09/18/2018 08/13/2018 07/16/2018  05/27/2018  PHQ - 2 Score 0 0 2 0  PHQ- 9 Score - 2 5 0  Exception Documentation - - - -     Cognitive Function Ad8 score reviewed for issues:  Issues making decisions:no  Less interest in hobbies / activities:no  Repeats questions, stories (family complaining):no  Trouble using ordinary gadgets (microwave, computer, phone):no  Forgets the month or year: no  Mismanaging finances: no  Remembering appts:no  Daily problems with thinking and/or memory:no Ad8 score is=0  MMSE - Mini Mental State Exam 04/22/2017  Orientation to time 5  Orientation to Place 5  Registration 3  Attention/ Calculation 5  Recall 2  Language- name 2 objects 2  Language- repeat 1  Language- follow 3 step command 3  Language- read & follow direction 1  Write a sentence 1  Copy design 1  Total score 29        Immunization History  Administered Date(s) Administered  . Influenza Split 03/24/2012, 03/24/2013  . Influenza, High Dose Seasonal PF 04/18/2016, 04/21/2017, 03/24/2018  . Influenza-Unspecified 03/21/2014, 03/15/2015, 03/18/2015  . Pneumococcal Conjugate-13 04/18/2016  . Pneumococcal Polysaccharide-23 05/22/2017  . Tdap 06/24/2008  . Zoster 04/19/2011   Screening Tests  Health Maintenance  Topic Date Due  . FOOT EXAM  11/21/1960  . TETANUS/TDAP  06/24/2018  . HEMOGLOBIN A1C  12/31/2018  . OPHTHALMOLOGY EXAM  03/11/2019  . MAMMOGRAM  05/14/2020  . COLONOSCOPY  03/27/2025  . INFLUENZA VACCINE  Completed  . DEXA SCAN  Completed  . Hepatitis C Screening  Completed  . PNA vac Low Risk Adult  Completed     Plan:    Please schedule your next medicare wellness visit with me in 1 yr.  Continue to eat heart healthy diet (full of fruits, vegetables, whole grains, lean protein, water--limit salt, fat, and sugar intake) and increase physical activity as tolerated.'  Continue doing brain stimulating activities (puzzles, reading, adult coloring books, staying active) to keep memory sharp.    Bring a copy of your living will and/or healthcare power of attorney to your next office visit.    I have personally reviewed and noted the following in the patient's chart:   . Medical and social history . Use of alcohol, tobacco or illicit drugs  . Current medications and supplements . Functional ability and status . Nutritional status . Physical activity . Advanced directives . List of other physicians . Hospitalizations, surgeries, and ER visits in previous 12 months . Vitals . Screenings to include cognitive, depression, and falls . Referrals and appointments  In addition, I have reviewed and discussed with patient certain preventive protocols, quality metrics, and best practice recommendations. A written personalized care plan for preventive services as well as general preventive health recommendations were provided to patient.     Shela Nevin, [place]  09/18/2018   Medical screening examination/treatment was performed by qualified clinical staff member and as supervising physician I was immediately available for consultation/collaboration. I have reviewed documentation and agree with assessment and plan.  Penni Homans, MD

## 2018-09-18 ENCOUNTER — Other Ambulatory Visit: Payer: Self-pay

## 2018-09-18 ENCOUNTER — Encounter: Payer: Self-pay | Admitting: *Deleted

## 2018-09-18 ENCOUNTER — Ambulatory Visit (INDEPENDENT_AMBULATORY_CARE_PROVIDER_SITE_OTHER): Payer: Medicare HMO | Admitting: *Deleted

## 2018-09-18 DIAGNOSIS — Z0000 Encounter for general adult medical examination without abnormal findings: Secondary | ICD-10-CM

## 2018-09-18 NOTE — Patient Instructions (Signed)
Please schedule your next medicare wellness visit with me in 1 yr.  Continue to eat heart healthy diet (full of fruits, vegetables, whole grains, lean protein, water--limit salt, fat, and sugar intake) and increase physical activity as tolerated.'  Continue doing brain stimulating activities (puzzles, reading, adult coloring books, staying active) to keep memory sharp.   Bring a copy of your living will and/or healthcare power of attorney to your next office visit.   [name] , Thank you for taking time to come for your Medicare Wellness Visit. I appreciate your ongoing commitment to your health goals. Please review the following plan we discussed and let me know if I can assist you in the future.   These are the goals we discussed: Goals    . Increase physical activity (pt-stated)     Restart Silver Sneakers.    . Weight (lb) < 300 lb (136.1 kg) (pt-stated)     With diet        This is a list of the screening recommended for you and due dates:  Health Maintenance  Topic Date Due  . Complete foot exam   11[birth year]21/1960  . Tetanus Vaccine  06/24/2018  . Hemoglobin A1C  12/31/2018  . Eye exam for diabetics  03/11/2019  . Mammogram  05/14/2020  . Colon Cancer Screening  03/27/2025  . Flu Shot  Completed  . DEXA scan (bone density measurement)  Completed  .  Hepatitis C: One time screening is recommended by Center for Disease Control  (CDC) for  adults born from [birth year] through 1965.   Completed  . Pneumonia vaccines  Completed    Health Maintenance After Age 51 After age 45, you are at a higher risk for certain long-term diseases and infections as well as injuries from falls. Falls are a major cause of broken bones and head injuries in people who are older than age 45. Getting regular preventive care can help to keep you healthy and well. Preventive care includes getting regular testing and making lifestyle changes as recommended by your health care provider. Talk with your health  care provider about:  Which screenings and tests you should have. A screening is a test that checks for a disease when you have no symptoms.  A diet and exercise plan that is right for you. What should I know about screenings and tests to prevent falls? Screening and testing are the best ways to find a health problem early. Early diagnosis and treatment give you the best chance of managing medical conditions that are common after age 42. Certain conditions and lifestyle choices may make you more likely to have a fall. Your health care provider may recommend:  Regular vision checks. Poor vision and conditions such as cataracts can make you more likely to have a fall. If you wear glasses, make sure to get your prescription updated if your vision changes.  Medicine review. Work with your health care provider to regularly review all of the medicines you are taking, including over-the-counter medicines. Ask your health care provider about any side effects that may make you more likely to have a fall. Tell your health care provider if any medicines that you take make you feel dizzy or sleepy.  Osteoporosis screening. Osteoporosis is a condition that causes the bones to get weaker. This can make the bones weak and cause them to break more easily.  Blood pressure screening. Blood pressure changes and medicines to control blood pressure can make you feel dizzy.  Strength  and balance checks. Your health care provider may recommend certain tests to check your strength and balance while standing, walking, or changing positions.  Foot health exam. Foot pain and numbness, as well as not wearing proper footwear, can make you more likely to have a fall.  Depression screening. You may be more likely to have a fall if you have a fear of falling, feel emotionally low, or feel unable to do activities that you used to do.  Alcohol use screening. Using too much alcohol can affect your balance and may make you more  likely to have a fall. What actions can I take to lower my risk of falls? General instructions  Talk with your health care provider about your risks for falling. Tell your health care provider if: ? You fall. Be sure to tell your health care provider about all falls, even ones that seem minor. ? You feel dizzy, sleepy, or off-balance.  Take over-the-counter and prescription medicines only as told by your health care provider. These include any supplements.  Eat a healthy diet and maintain a healthy weight. A healthy diet includes low-fat dairy products, low-fat (lean) meats, and fiber from whole grains, beans, and lots of fruits and vegetables. Home safety  Remove any tripping hazards, such as rugs, cords, and clutter.  Install safety equipment such as grab bars in bathrooms and safety rails on stairs.  Keep rooms and walkways well-lit. Activity   Follow a regular exercise program to stay fit. This will help you maintain your balance. Ask your health care provider what types of exercise are appropriate for you.  If you need a cane or walker, use it as recommended by your health care provider.  Wear supportive shoes that have nonskid soles. Lifestyle  Do not drink alcohol if your health care provider tells you not to drink.  If you drink alcohol, limit how much you have: ? 0-1 drink a day for women. ? 0-2 drinks a day for men.  Be aware of how much alcohol is in your drink. In the [place] one drink equals one typical bottle of beer (12 oz), one-half glass of wine (5 oz), or one shot of hard liquor (1 oz).  Do not use any products that contain nicotine or tobacco, such as cigarettes and e-cigarettes. If you need help quitting, ask your health care provider. Summary  Having a healthy lifestyle and getting preventive care can help to protect your health and wellness after age 56.  Screening and testing are the best way to find a health problem early and help you avoid having a  fall. Early diagnosis and treatment give you the best chance for managing medical conditions that are more common for people who are older than age 50.  Falls are a major cause of broken bones and head injuries in people who are older than age 48. Take precautions to prevent a fall at home.  Work with your health care provider to learn what changes you can make to improve your health and wellness and to prevent falls. This information is not intended to replace advice given to you by your health care provider. Make sure you discuss any questions you have with your health care provider. Document Released: 04/23/2017 Document Revised: 04/23/2017 Document Reviewed: 04/23/2017 Elsevier Interactive Patient Education  2019 [employer].

## 2018-09-21 ENCOUNTER — Other Ambulatory Visit (INDEPENDENT_AMBULATORY_CARE_PROVIDER_SITE_OTHER): Payer: Self-pay

## 2018-09-21 DIAGNOSIS — E119 Type 2 diabetes mellitus without complications: Secondary | ICD-10-CM

## 2018-09-21 MED ORDER — SEMAGLUTIDE(0.25 OR 0.5MG/DOS) 2 MG/1.5ML ~~LOC~~ SOPN: 0.5000 mg | PEN_INJECTOR | SUBCUTANEOUS | 0 refills | Status: DC

## 2018-09-23 ENCOUNTER — Other Ambulatory Visit: Payer: Self-pay

## 2018-09-23 NOTE — Patient Outreach (Signed)
[facility]) Care Management  09/23/2018   [name] [birth year] [id number]  Subjective: Successful call to the patient. HIPAA verified. The patient states that she has been doing fine.  She states that she is still losing weight .  She has not been able to go to her weight loss appointments due to stay at home order,  She states that she has not been able to get the foods that she usually eats  but has been able to find substitutes.  She states that she uses [employer] and they will place her groceries in her car. She has her medications and taking them.  They are delivered to her home.  FBS have been ranging in the 100's. She is checking her blood sugars daily.  The patient denies any pain but states that she had a fall three weeks ago.  She states that she is not sure what happened her feet got tangled and she fell on her knees.  She states that she was not hurt but had a bruise on both knees. Discussed with the patient about fall precautions and she verbalized understanding.    Current Medications:  Current Outpatient Medications  Medication Sig Dispense Refill  . acetaminophen (TYLENOL) 500 MG tablet Take 1,000 mg by mouth 2 (two) times daily as needed for moderate pain or headache.    . albuterol (PROVENTIL HFA;VENTOLIN HFA) 108 (90 Base) MCG/ACT inhaler Inhale 2 puffs into the lungs every 6 (six) hours as needed for wheezing or shortness of breath. 1 Inhaler 12  . anastrozole (ARIMIDEX) 1 MG tablet TAKE 1 TABLET (1 MG TOTAL) BY MOUTH DAILY. 90 tablet 3  . aspirin EC 81 MG tablet Take 81 mg by mouth at bedtime.     Marland Kitchen BREO ELLIPTA 100-25 MCG/INH AEPB INHALE 1 PUFF INTO LUNGS ONCE A DAY 60 each 0  . BYSTOLIC 10 MG tablet TAKE 1 TABLET (10 MG TOTAL) BY MOUTH AT BEDTIME. 90 tablet 2  . diclofenac (VOLTAREN) 75 MG EC tablet TAKE 1 TABLET TWICE DAILY 180 tablet 1  . diltiazem (CARDIZEM CD) 120 MG 24 hr capsule TAKE 1 CAPSULE (120 MG TOTAL) BY MOUTH DAILY. 90  capsule 2  . escitalopram (LEXAPRO) 20 MG tablet TAKE 1 TABLET (20 MG TOTAL) BY MOUTH DAILY. 90 tablet 1  . esomeprazole (NEXIUM) 40 MG capsule TAKE 1 CAPSULE EVERY DAY 90 capsule 2  . ezetimibe (ZETIA) 10 MG tablet TAKE 1 TABLET EVERY DAY 90 tablet 3  . fluticasone (FLONASE) 50 MCG/ACT nasal spray Place 2 sprays into both nostrils daily. 16 g 6  . furosemide (LASIX) 20 MG tablet TAKE 1 TABLET (20 MG TOTAL) BY MOUTH DAILY. 90 tablet 2  . glucose blood (ACCU-CHEK AVIVA) test strip Twice daily (please use brand that patient insurance covers) 100 each 5  . Hypromellose (ARTIFICIAL TEARS OP) Place 1 drop into both eyes daily as needed (dry eyes).     Marland Kitchen loratadine (CLARITIN) 10 MG tablet Take 1 tablet (10 mg total) by mouth daily. 30 tablet 11  . losartan (COZAAR) 100 MG tablet TAKE 1 TABLET EVERY DAY 90 tablet 2  . metFORMIN (GLUCOPHAGE) 500 MG tablet Take 1 tablet (500 mg total) by mouth 2 (two) times daily with a meal. 180 tablet 3  . montelukast (SINGULAIR) 10 MG tablet TAKE 1 TABLET (10 MG TOTAL) BY MOUTH AT BEDTIME. 90 tablet 2  . Probiotic Product (PROBIOTIC PO) Take 1 capsule by mouth at bedtime.    Marland Kitchen  ranitidine (ZANTAC) 150 MG tablet TAKE 1 TABLET (150 MG TOTAL) BY MOUTH AT BEDTIME. 90 tablet 1  . rosuvastatin (CRESTOR) 40 MG tablet TAKE 1 TABLET EVERY DAY 90 tablet 3  . Semaglutide,0.25 or 0.5MG /DOS, (OZEMPIC, 0.25 OR 0.5 MG/DOSE,) 2 MG/1.5ML SOPN Inject 0.5 mg into the skin once a week. 4 pen 0  . Vitamin D, Ergocalciferol, (DRISDOL) 1.25 MG (50000 UT) CAPS capsule Take 1 capsule (50,000 Units total) by mouth every 7 (seven) days. 2 capsule 0   No current facility-administered medications for this visit.     Functional Status:  In your present state of health, do you have any difficulty performing the following activities: 09/18/2018  Hearing? N  Vision? N  Difficulty concentrating or making decisions? N  Walking or climbing stairs? N  Dressing or bathing? N  Doing errands, shopping?  N  Preparing Food and eating ? N  Using the Toilet? N  In the past six months, have you accidently leaked urine? N  Do you have problems with loss of bowel control? N  Managing your Medications? N  Managing your Finances? N  Housekeeping or managing your Housekeeping? N  Some recent data might be hidden    Fall/Depression Screening: Fall Risk  09/23/2018 09/18/2018 06/29/2018  Falls in the past year? 1 1 0  Number falls in past yr: 0 0 -  Injury with Fall? 0 0 -  Comment patient stated that she bruised her knees - -  Risk for fall due to : Other (Comment) - -  Risk for fall due to: Comment Patient states that it was just a "weird accident" she tripped over her feet. - -  Follow up Education provided - -   PHQ 2/9 Scores 09/18/2018 08/13/2018 07/16/2018 05/27/2018 05/07/2018 04/22/2018 04/08/2018  PHQ - 2 Score 0 0 2 0 0 2 2  PHQ- 9 Score - 2 5 0 1 4 6   Exception Documentation - - - - - - -    Assessment: Patient will continue to benefit from health coach outreach for disease management and support. [facility] CM Care Plan Problem One     Most Recent Value  [facility] Long Term Goal   In 90 days the patient will have decreased her a1c by 2 points.  [facility] Long Term Goal Start Date  09/23/18  Interventions for Problem One Long Term Goal  Reviewed FBS, diet and medication management.  .  Advised patient to avoid people who are sick, social distancing and washing hands with soap and water or using hand sanitizer.        Plan: RN Health Coach will contact patient in the month of July and patient agrees to next outreach.   Lazaro Arms RN, BSN, Veteran Direct Dial:  [phone]  Fax: [phone]

## 2018-09-25 ENCOUNTER — Other Ambulatory Visit: Payer: Self-pay | Admitting: Family Medicine

## 2018-09-29 ENCOUNTER — Encounter (INDEPENDENT_AMBULATORY_CARE_PROVIDER_SITE_OTHER): Payer: Self-pay | Admitting: Bariatrics

## 2018-09-29 ENCOUNTER — Other Ambulatory Visit: Payer: Self-pay

## 2018-09-29 ENCOUNTER — Ambulatory Visit (INDEPENDENT_AMBULATORY_CARE_PROVIDER_SITE_OTHER): Payer: Self-pay | Admitting: Family Medicine

## 2018-09-29 ENCOUNTER — Ambulatory Visit (INDEPENDENT_AMBULATORY_CARE_PROVIDER_SITE_OTHER): Payer: Medicare HMO | Admitting: Bariatrics

## 2018-09-29 DIAGNOSIS — E119 Type 2 diabetes mellitus without complications: Secondary | ICD-10-CM

## 2018-09-29 DIAGNOSIS — Z6841 Body Mass Index (BMI) 40.0 and over, adult: Secondary | ICD-10-CM

## 2018-09-29 DIAGNOSIS — E559 Vitamin D deficiency, unspecified: Secondary | ICD-10-CM

## 2018-09-30 NOTE — Progress Notes (Signed)
Office: [phone]  /  Fax: [phone] TeleHealth Visit:  [name] has verbally consented to this TeleHealth visit today. The patient is located at home, the provider is located at the [employer] [facility] office. The participants in this visit include the listed provider and patient and any and all parties involved. The visit was conducted today via FaceTime.  HPI:   Chief Complaint: OBESITY [name] is here to discuss her progress with her obesity treatment plan. She is on the Category 4 plan and is following her eating plan approximately 90 % of the time. She states she is walking for 10 minutes 3 times per week. [name] is not sure if she has stayed the same or gained weight. She has been at home. She is doing well with the stress. [name] has not been able to find bread or eggs. She has increased stress eating. We were unable to weigh the patient today for this TeleHealth visit. She feels as if she has either maintained or gained weight since her last visit.   Diabetes II [name] has a diagnosis of diabetes type II. She is taking Metformin and Ozempic. [name] states fasting BGs are 100 and she denies any lows. Last A1c was at 6.3 and last insulin level was at 59.3 Her diabetes is well controlled. She has been working on intensive lifestyle modifications including diet, exercise, and weight loss to help control her blood glucose levels.  Vitamin D deficiency [name] has a diagnosis of vitamin D deficiency. She is currently taking vit D and denies nausea, vomiting or muscle weakness.  ASSESSMENT AND PLAN:  Type 2 diabetes mellitus without complication, without long-term current use of insulin (HCC)  Vitamin D deficiency  Class 3 severe obesity with serious comorbidity and body mass index (BMI) of 45.0 to 49.9 in adult, unspecified obesity type ([place])  PLAN:  Diabetes II [name] has been given extensive diabetes education by myself today including ideal fasting and  post-prandial blood glucose readings, individual ideal Hgb A1c goals and hypoglycemia prevention. We discussed the importance of good blood sugar control to decrease the likelihood of diabetic complications such as nephropathy, neuropathy, limb loss, blindness, coronary artery disease, and death. We discussed the importance of intensive lifestyle modification including diet, exercise and weight loss as the first line treatment for diabetes. [name] agrees to continue her diabetes medications (metformin, ozempic) and will follow up at the agreed upon time.  Vitamin D Deficiency [name] was informed that low vitamin D levels contributes to fatigue and are associated with obesity, breast, and colon cancer. She agrees to continue to take prescription Vit D @50 ,000 IU every week and will follow up for routine testing of vitamin D, at least 2-3 times per year. She was informed of the risk of over-replacement of vitamin D and agrees to not increase her dose unless she discusses this with [name] first.  Obesity [name] is currently in the action stage of change. As such, her goal is to continue with weight loss efforts She has agreed to follow the Category 4 plan [name] will continue to exercise (walking) for weight loss and overall health benefits. We discussed the following Behavioral Modification Strategies today: planning for success, increase H2O intake, better snacking choices, increasing lean protein intake, decreasing simple carbohydrates, increasing vegetables, decrease eating out, work on meal planning and easy cooking plans, emotional eating strategies, ways to avoid boredom eating and ways to avoid night time snacking [name] will weigh herself at home (old scale).  [employer]  has agreed to follow up with our clinic in 2 weeks. She was informed of the importance of frequent follow up visits to maximize her success with intensive lifestyle modifications for her multiple health conditions.  ALLERGIES:  Allergies  Allergen Reactions  . No Known Allergies     MEDICATIONS: Current Outpatient Medications on File Prior to Visit  Medication Sig Dispense Refill  . acetaminophen (TYLENOL) 500 MG tablet Take 1,000 mg by mouth 2 (two) times daily as needed for moderate pain or headache.    . albuterol (PROVENTIL HFA;VENTOLIN HFA) 108 (90 Base) MCG/ACT inhaler Inhale 2 puffs into the lungs every 6 (six) hours as needed for wheezing or shortness of breath. 1 Inhaler 12  . anastrozole (ARIMIDEX) 1 MG tablet TAKE 1 TABLET (1 MG TOTAL) BY MOUTH DAILY. 90 tablet 3  . aspirin EC 81 MG tablet Take 81 mg by mouth at bedtime.     Marland Kitchen BREO ELLIPTA 100-25 MCG/INH AEPB INHALE 1 PUFF INTO LUNGS ONCE A DAY 60 each 0  . BYSTOLIC 10 MG tablet TAKE 1 TABLET (10 MG TOTAL) BY MOUTH AT BEDTIME. 90 tablet 2  . diclofenac (VOLTAREN) 75 MG EC tablet TAKE 1 TABLET TWICE DAILY 180 tablet 1  . diltiazem (CARDIZEM CD) 120 MG 24 hr capsule TAKE 1 CAPSULE (120 MG TOTAL) BY MOUTH DAILY. 90 capsule 2  . escitalopram (LEXAPRO) 20 MG tablet TAKE 1 TABLET (20 MG TOTAL) BY MOUTH DAILY. 90 tablet 1  . esomeprazole (NEXIUM) 40 MG capsule TAKE 1 CAPSULE EVERY DAY 90 capsule 2  . ezetimibe (ZETIA) 10 MG tablet TAKE 1 TABLET EVERY DAY 90 tablet 3  . fluticasone (FLONASE) 50 MCG/ACT nasal spray Place 2 sprays into both nostrils daily. 16 g 6  . furosemide (LASIX) 20 MG tablet TAKE 1 TABLET (20 MG TOTAL) BY MOUTH DAILY. 90 tablet 2  . glucose blood (ACCU-CHEK AVIVA) test strip Twice daily (please use brand that patient insurance covers) 100 each 5  . Hypromellose (ARTIFICIAL TEARS OP) Place 1 drop into both eyes daily as needed (dry eyes).     Marland Kitchen loratadine (CLARITIN) 10 MG tablet Take 1 tablet (10 mg total) by mouth daily. 30 tablet 11  . losartan (COZAAR) 100 MG tablet TAKE 1 TABLET EVERY DAY 90 tablet 2  . metFORMIN (GLUCOPHAGE) 500 MG tablet Take 1 tablet (500 mg total) by mouth 2 (two) times daily with a meal. 180 tablet 3  . montelukast  (SINGULAIR) 10 MG tablet TAKE 1 TABLET (10 MG TOTAL) BY MOUTH AT BEDTIME. 90 tablet 2  . Probiotic Product (PROBIOTIC PO) Take 1 capsule by mouth at bedtime.    . ranitidine (ZANTAC) 150 MG tablet TAKE 1 TABLET (150 MG TOTAL) BY MOUTH AT BEDTIME. 90 tablet 1  . rosuvastatin (CRESTOR) 40 MG tablet TAKE 1 TABLET EVERY DAY 90 tablet 3  . Semaglutide,0.25 or 0.5MG /DOS, (OZEMPIC, 0.25 OR 0.5 MG/DOSE,) 2 MG/1.5ML SOPN Inject 0.5 mg into the skin once a week. 4 pen 0  . Vitamin D, Ergocalciferol, (DRISDOL) 1.25 MG (50000 UT) CAPS capsule Take 1 capsule (50,000 Units total) by mouth every 7 (seven) days. 2 capsule 0   No current facility-administered medications on file prior to visit.     PAST MEDICAL HISTORY: Past Medical History:  Diagnosis Date  . Anemia 04/05/2012  . Anxiety   . Anxiety state 09/11/2007   Qualifier: Diagnosis of  By: Scherrie Gerlach    . Asthma   . Atrial tachycardia, paroxysmal ([place])   .  Back pain 10/02/2014  . Breast cancer ([place]) 02/17/2017  . Chicken pox as a child  . Chronic diastolic CHF (congestive heart failure), NYHA class 1 ([place])   . Complication of anesthesia    pt states feels different in her body after anesthesia when waking up and also experiences a smell of burnt plastic for approx a wk   . Constipation   . Depression   . Diabetes ([place])   . Dilated aortic root ([place])    90mm by echo 09/2016  . Dysuria 02/17/2017  . Elevated LFTs 04/05/2012  . GERD (gastroesophageal reflux disease)   . Goiter   . Heart murmur    hx of one at birth   . History of kidney stones   . History of radiation therapy 10/31/16-12/17/16   left breast 45 Gy in 25 fractions, left breast boost 16 Gy in 8 fractions  . Hx of colonic polyps   . Hyperlipidemia   . Hypertension   . Insomnia 10/08/2016  . Joint pain   . Malignant neoplasm of upper-outer quadrant of left female breast ([place]) 05/14/2016   not with patient  . Measles as a child  . Mumps as a child  . OA (osteoarthritis) of  knee 04/05/2012  . Obesity 07/11/2014  . PONV (postoperative nausea and vomiting)   . Preventative health care 09/05/2013  . PVC's (premature ventricular contractions) 05/02/2016  . Shortness of breath dyspnea    walking distances / climbing stairs  . Sleep apnea 04/05/2012  . Tinea corporis 02/23/2013  . Type 2 diabetes mellitus with hyperglycemia ([place]) 12/31/2013  . Vasomotor rhinitis 04/05/2012  . Ventral hernia   . Wheezing     PAST SURGICAL HISTORY: Past Surgical History:  Procedure Laterality Date  . BREAST LUMPECTOMY Left 2017  . BREAST LUMPECTOMY WITH RADIOACTIVE SEED AND SENTINEL LYMPH NODE BIOPSY Left 06/21/2016   Procedure: LEFT BREAST LUMPECTOMY WITH RADIOACTIVE SEED AND SENTINEL LYMPH NODE BIOPSY, INJECT BLUE DYE LEFT BREAST;  Surgeon: Fanny Skates, MD;  Location: [facility];  Service: General;  Laterality: Left;  . CARDIAC CATHETERIZATION     normal coroary arteries per patient  . CARDIOVASCULAR STRESS TEST     10/12/2013  . CESAREAN SECTION     X 3  . CHOLECYSTECTOMY    . COLONOSCOPY WITH PROPOFOL N/A 03/28/2015   Procedure: COLONOSCOPY WITH PROPOFOL;  Surgeon: Juanita Craver, MD;  Location: [facility];  Service: Endoscopy;  Laterality: N/A;  . HERNIA REPAIR  02/08/11   ventral hernia  . JOINT REPLACEMENT     bilateral  . KNEE ARTHROSCOPY  05/2010   bilateral  . LEFT AND RIGHT HEART CATHETERIZATION WITH CORONARY ANGIOGRAM N/A 11/08/2013   Procedure: LEFT AND RIGHT HEART CATHETERIZATION WITH CORONARY ANGIOGRAM;  Surgeon: [name], MD;  Location: [facility] CATH LAB;  Service: Cardiovascular;  Laterality: N/A;  . MENISCUS REPAIR  2009  . MOUTH SURGERY     teeth implants  . PORT-A-CATH REMOVAL N/A 01/24/2017   Procedure: REMOVAL PORT-A-CATH;  Surgeon: Fanny Skates, MD;  Location: [place];  Service: General;  Laterality: N/A;  . PORTACATH PLACEMENT Right 07/16/2016   Procedure: INSERTION PORT-A-CATH RIGHT INTERNAL JUGULAR WITH ULTRASOUND;  Surgeon: Fanny Skates, MD;   Location: [place];  Service: General;  Laterality: Right;  . TOTAL KNEE ARTHROPLASTY  2011   left  . TOTAL KNEE ARTHROPLASTY Right 05/10/2014   Procedure: RIGHT TOTAL KNEE ARTHROPLASTY;  Surgeon: Mauri Pole, MD;  Location: [facility];  Service: Orthopedics;  Laterality: Right;  . TUBAL LIGATION    . WISDOM TOOTH EXTRACTION  2000    SOCIAL HISTORY: Social History   Tobacco Use  . Smoking status: Never Smoker  . Smokeless tobacco: Never Used  Substance Use Topics  . Alcohol use: Not Currently    Comment: rarely  . Drug use: No    FAMILY HISTORY: Family History  Problem Relation Age of Onset  . Thyroid disease Mother   . Hyperlipidemia Mother   . Depression Mother   . Anxiety disorder Mother   . Heart attack Father 48  . Hypertension Father   . Arthritis Father        RA  . Coronary artery disease Father   . High Cholesterol Father   . Coronary artery disease Brother   . Heart disease Brother   . Cancer Maternal Aunt        colon  . Cancer Maternal Grandmother        colon  . Heart attack Maternal Grandfather   . Alcohol abuse Paternal Grandfather     ROS: Review of Systems  Constitutional: Negative for weight loss.  Gastrointestinal: Negative for nausea and vomiting.  Musculoskeletal:       Negative for muscle weakness    PHYSICAL EXAM: Pt in no acute distress  RECENT LABS AND TESTS: BMET    Component Value Date/Time   NA 142 07/02/2018 1230   NA 145 04/11/2017 0942   K 5.0 07/02/2018 1230   K 4.4 04/11/2017 0942   CL 100 07/02/2018 1230   CO2 22 07/02/2018 1230   CO2 26 04/11/2017 0942   GLUCOSE 118 (H) 07/02/2018 1230   GLUCOSE 221 (H) 12/22/2017 0907   GLUCOSE 156 (H) 04/11/2017 0942   BUN 24 07/02/2018 1230   BUN 15.1 04/11/2017 0942   CREATININE 0.80 07/02/2018 1230   CREATININE 0.87 10/06/2017 1010   CREATININE 0.8 04/11/2017 0942   CALCIUM 10.3 07/02/2018 1230   CALCIUM 10.0 04/11/2017 0942   GFRNONAA 77 07/02/2018 1230   GFRNONAA >60  10/06/2017 1010   GFRAA 88 07/02/2018 1230   GFRAA >60 10/06/2017 1010   Lab Results  Component Value Date   HGBA1C 6.3 (H) 07/02/2018   HGBA1C 9.5 (H) 03/26/2018   HGBA1C 8.7 (H) 12/22/2017   HGBA1C 7.5 (H) 09/22/2017   HGBA1C 7.2 (H) 05/22/2017   Lab Results  Component Value Date   INSULIN 59.3 (H) 07/02/2018   INSULIN 57.0 (H) 03/26/2018   CBC    Component Value Date/Time   WBC 9.0 03/26/2018 1024   WBC 7.5 12/22/2017 0907   RBC 4.95 03/26/2018 1024   RBC 4.87 12/22/2017 0907   HGB 12.4 03/26/2018 1024   HGB 12.2 04/11/2017 0942   HCT 40.3 03/26/2018 1024   HCT 39.1 04/11/2017 0942   PLT 282.0 12/22/2017 0907   PLT 274 10/06/2017 1010   PLT 240 04/11/2017 0942   MCV 81 03/26/2018 1024   MCV 81.6 04/11/2017 0942   MCH 25.1 (L) 03/26/2018 1024   MCH 25.6 10/06/2017 1010   MCHC 30.8 (L) 03/26/2018 1024   MCHC 31.1 12/22/2017 0907   RDW 17.1 (H) 03/26/2018 1024   RDW 19.2 (H) 04/11/2017 0942   LYMPHSABS 1.5 03/26/2018 1024   LYMPHSABS 1.4 04/11/2017 0942   MONOABS 0.5 12/22/2017 0907   MONOABS 0.3 04/11/2017 0942   EOSABS 0.0 03/26/2018 1024   BASOSABS 0.0 03/26/2018 1024   BASOSABS 0.0 04/11/2017 0942   Iron/TIBC/Ferritin/ %Sat No results found  for: IRON, TIBC, FERRITIN, IRONPCTSAT Lipid Panel     Component Value Date/Time   CHOL 126 03/26/2018 1024   TRIG 188 (H) 03/26/2018 1024   HDL 34 (L) 03/26/2018 1024   CHOLHDL 3 12/22/2017 0907   VLDL 33.2 12/22/2017 0907   LDLCALC 54 03/26/2018 1024   LDLDIRECT 108.0 10/08/2016 0849   Hepatic Function Panel     Component Value Date/Time   PROT 7.1 07/02/2018 1230   PROT 7.5 04/11/2017 0942   ALBUMIN 4.1 07/02/2018 1230   ALBUMIN 3.6 04/11/2017 0942   AST 19 07/02/2018 1230   AST 33 10/06/2017 1010   AST 41 (H) 04/11/2017 0942   ALT 24 07/02/2018 1230   ALT 30 10/06/2017 1010   ALT 40 04/11/2017 0942   ALKPHOS 136 (H) 07/02/2018 1230   ALKPHOS 93 04/11/2017 0942   BILITOT 0.3 07/02/2018 1230    BILITOT 0.5 10/06/2017 1010   BILITOT 0.48 04/11/2017 0942   BILIDIR 0.15 06/19/2017 0950   IBILI 0.3 05/24/2016 0828      Component Value Date/Time   TSH 2.130 03/26/2018 1024   TSH 1.87 12/22/2017 0907   TSH 0.81 09/22/2017 0850     Ref. Range 07/02/2018 12:30  Vitamin D, 25-Hydroxy Latest Ref Range: 30.0 - 100.0 ng/mL 31.4    I, Doreene Nest, am acting as Location manager for [employer]. Owens Shark, DO  I have reviewed the above documentation for accuracy and completeness, and I agree with the above. -Jearld Lesch, DO

## 2018-10-13 ENCOUNTER — Other Ambulatory Visit: Payer: Self-pay

## 2018-10-13 ENCOUNTER — Ambulatory Visit (INDEPENDENT_AMBULATORY_CARE_PROVIDER_SITE_OTHER): Payer: 59 | Admitting: Bariatrics

## 2018-10-13 ENCOUNTER — Encounter (INDEPENDENT_AMBULATORY_CARE_PROVIDER_SITE_OTHER): Payer: Self-pay | Admitting: Bariatrics

## 2018-10-13 ENCOUNTER — Other Ambulatory Visit: Payer: Self-pay | Admitting: Family Medicine

## 2018-10-13 DIAGNOSIS — E119 Type 2 diabetes mellitus without complications: Secondary | ICD-10-CM

## 2018-10-13 DIAGNOSIS — E559 Vitamin D deficiency, unspecified: Secondary | ICD-10-CM | POA: Diagnosis not present

## 2018-10-13 DIAGNOSIS — Z6841 Body Mass Index (BMI) 40.0 and over, adult: Secondary | ICD-10-CM

## 2018-10-13 NOTE — Progress Notes (Signed)
Office: [phone]  /  Fax: [phone] TeleHealth Visit:  [name] has verbally consented to this TeleHealth visit today. The patient is located at home, the provider is located at the [facility] office. The participants in this visit include the listed provider and patient. The visit was conducted today via Face Time.  HPI:   Chief Complaint: OBESITY [name] is here to discuss her progress with her obesity treatment plan. She is on the Category 4 plan and is following her eating plan approximately 50 % of the time. She states she is walking 10 minutes 3 times per week. [name] states that her weight has remained the same. She has an increased appetite and is stress eating.  We were unable to weigh the patient today for this TeleHealth visit. She feels as if she has maintained weight since her last visit. She has lost 49 lbs since starting treatment with [name].  Diabetes II [name] has a diagnosis of diabetes type II. [name] is taking metformin and semaglutide. Her A1c was 6.3 and her Insulin was 59.3 on 07/02/18. She denies occasional queasiness in the stomach. She has been working on intensive lifestyle modifications including diet, exercise, and weight loss to help control her blood glucose levels.  Vitamin D Deficiency [name] has a diagnosis of vitamin D deficiency. She is currently on vit D and her level was 31.4 on 07/02/18. [name] denies nausea, vomiting, or muscle weakness.  ASSESSMENT AND PLAN:  Type 2 diabetes mellitus without complication, without long-term current use of insulin (HCC)  Vitamin D deficiency  Class 3 severe obesity with serious comorbidity and body mass index (BMI) of 45.0 to 49.9 in adult, unspecified obesity type ([place])  PLAN:  Diabetes II [name] has been given extensive diabetes education by myself today including ideal fasting and post-prandial blood glucose readings, individual ideal Hgb A1c goals, and hypoglycemia prevention.  We discussed the importance of good blood sugar control to decrease the likelihood of diabetic complications such as nephropathy, neuropathy, limb loss, blindness, coronary artery disease, and death. We discussed the importance of intensive lifestyle modification including diet, exercise, and weight loss as the first line treatment for diabetes. [name] agrees to continue her diabetes medications and will follow up at the agreed upon time in 2 weeks.  Vitamin D Deficiency [name] was informed that low vitamin D levels contribute to fatigue and are associated with obesity, breast, and colon cancer. [name] agrees to continue to take prescription Vit D @50 ,000 IU and will follow up for routine testing of vitamin D, at least 2-3 times per year. She was informed of the risk of over-replacement of vitamin D and agrees to not increase her dose unless she discusses this with [name] first. [name] agrees to follow up in 2 weeks as directed.  Obesity [name] is currently in the action stage of change. As such, her goal is to continue with weight loss efforts. She has agreed to follow the Category 4 plan with meal planning and increasing protein and water intake. She will weigh herself before each visit. Kevin has been instructed to continue and increase exercise. We discussed the following Behavioral Modification Strategies today: increasing lean protein intake, decreasing simple carbohydrates, increasing vegetables, increase H2O intake, decrease eating out, no skipping meals, keeping healthy foods in the home, and work on meal planning and easy cooking plans.  [name] has agreed to follow up with our clinic in 2 weeks. She was informed of the importance of frequent follow up visits  to maximize her success with intensive lifestyle modifications for her multiple health conditions.  ALLERGIES: Allergies  Allergen Reactions  . No Known Allergies     MEDICATIONS: Current Outpatient Medications on File Prior to  Visit  Medication Sig Dispense Refill  . acetaminophen (TYLENOL) 500 MG tablet Take 1,000 mg by mouth 2 (two) times daily as needed for moderate pain or headache.    . albuterol (PROVENTIL HFA;VENTOLIN HFA) 108 (90 Base) MCG/ACT inhaler Inhale 2 puffs into the lungs every 6 (six) hours as needed for wheezing or shortness of breath. 1 Inhaler 12  . anastrozole (ARIMIDEX) 1 MG tablet TAKE 1 TABLET (1 MG TOTAL) BY MOUTH DAILY. 90 tablet 3  . aspirin EC 81 MG tablet Take 81 mg by mouth at bedtime.     Marland Kitchen BREO ELLIPTA 100-25 MCG/INH AEPB INHALE 1 PUFF INTO LUNGS ONCE A DAY 60 each 0  . BYSTOLIC 10 MG tablet TAKE 1 TABLET (10 MG TOTAL) BY MOUTH AT BEDTIME. 90 tablet 2  . diclofenac (VOLTAREN) 75 MG EC tablet TAKE 1 TABLET TWICE DAILY 180 tablet 1  . escitalopram (LEXAPRO) 20 MG tablet TAKE 1 TABLET (20 MG TOTAL) BY MOUTH DAILY. 90 tablet 1  . esomeprazole (NEXIUM) 40 MG capsule TAKE 1 CAPSULE EVERY DAY 90 capsule 2  . ezetimibe (ZETIA) 10 MG tablet TAKE 1 TABLET EVERY DAY 90 tablet 3  . fluticasone (FLONASE) 50 MCG/ACT nasal spray Place 2 sprays into both nostrils daily. 16 g 6  . furosemide (LASIX) 20 MG tablet TAKE 1 TABLET (20 MG TOTAL) BY MOUTH DAILY. 90 tablet 2  . glucose blood (ACCU-CHEK AVIVA) test strip Twice daily (please use brand that patient insurance covers) 100 each 5  . Hypromellose (ARTIFICIAL TEARS OP) Place 1 drop into both eyes daily as needed (dry eyes).     Marland Kitchen loratadine (CLARITIN) 10 MG tablet Take 1 tablet (10 mg total) by mouth daily. 30 tablet 11  . losartan (COZAAR) 100 MG tablet TAKE 1 TABLET EVERY DAY 90 tablet 2  . metFORMIN (GLUCOPHAGE) 500 MG tablet Take 1 tablet (500 mg total) by mouth 2 (two) times daily with a meal. 180 tablet 3  . Probiotic Product (PROBIOTIC PO) Take 1 capsule by mouth at bedtime.    . ranitidine (ZANTAC) 150 MG tablet TAKE 1 TABLET (150 MG TOTAL) BY MOUTH AT BEDTIME. 90 tablet 1  . rosuvastatin (CRESTOR) 40 MG tablet TAKE 1 TABLET EVERY DAY 90  tablet 3  . Semaglutide,0.25 or 0.5MG /DOS, (OZEMPIC, 0.25 OR 0.5 MG/DOSE,) 2 MG/1.5ML SOPN Inject 0.5 mg into the skin once a week. 4 pen 0  . Vitamin D, Ergocalciferol, (DRISDOL) 1.25 MG (50000 UT) CAPS capsule Take 1 capsule (50,000 Units total) by mouth every 7 (seven) days. 2 capsule 0   No current facility-administered medications on file prior to visit.     PAST MEDICAL HISTORY: Past Medical History:  Diagnosis Date  . Anemia 04/05/2012  . Anxiety   . Anxiety state 09/11/2007   Qualifier: Diagnosis of  By: Scherrie Gerlach    . Asthma   . Atrial tachycardia, paroxysmal ([place])   . Back pain 10/02/2014  . Breast cancer ([place]) 02/17/2017  . Chicken pox as a child  . Chronic diastolic CHF (congestive heart failure), NYHA class 1 ([place])   . Complication of anesthesia    pt states feels different in her body after anesthesia when waking up and also experiences a smell of burnt plastic for approx a wk   .  Constipation   . Depression   . Diabetes ([place])   . Dilated aortic root ([place])    32mm by echo 09/2016  . Dysuria 02/17/2017  . Elevated LFTs 04/05/2012  . GERD (gastroesophageal reflux disease)   . Goiter   . Heart murmur    hx of one at birth   . History of kidney stones   . History of radiation therapy 10/31/16-12/17/16   left breast 45 Gy in 25 fractions, left breast boost 16 Gy in 8 fractions  . Hx of colonic polyps   . Hyperlipidemia   . Hypertension   . Insomnia 10/08/2016  . Joint pain   . Malignant neoplasm of upper-outer quadrant of left female breast ([place]) 05/14/2016   not with patient  . Measles as a child  . Mumps as a child  . OA (osteoarthritis) of knee 04/05/2012  . Obesity 07/11/2014  . PONV (postoperative nausea and vomiting)   . Preventative health care 09/05/2013  . PVC's (premature ventricular contractions) 05/02/2016  . Shortness of breath dyspnea    walking distances / climbing stairs  . Sleep apnea 04/05/2012  . Tinea corporis 02/23/2013  . Type 2 diabetes  mellitus with hyperglycemia ([place]) 12/31/2013  . Vasomotor rhinitis 04/05/2012  . Ventral hernia   . Wheezing     PAST SURGICAL HISTORY: Past Surgical History:  Procedure Laterality Date  . BREAST LUMPECTOMY Left 2017  . BREAST LUMPECTOMY WITH RADIOACTIVE SEED AND SENTINEL LYMPH NODE BIOPSY Left 06/21/2016   Procedure: LEFT BREAST LUMPECTOMY WITH RADIOACTIVE SEED AND SENTINEL LYMPH NODE BIOPSY, INJECT BLUE DYE LEFT BREAST;  Surgeon: Fanny Skates, MD;  Location: [place] [facility];  Service: General;  Laterality: Left;  . CARDIAC CATHETERIZATION     normal coroary arteries per patient  . CARDIOVASCULAR STRESS TEST     10/12/2013  . CESAREAN SECTION     X 3  . CHOLECYSTECTOMY    . COLONOSCOPY WITH PROPOFOL N/A 03/28/2015   Procedure: COLONOSCOPY WITH PROPOFOL;  Surgeon: Juanita Craver, MD;  Location: [facility];  Service: Endoscopy;  Laterality: N/A;  . HERNIA REPAIR  02/08/11   ventral hernia  . JOINT REPLACEMENT     bilateral  . KNEE ARTHROSCOPY  05/2010   bilateral  . LEFT AND RIGHT HEART CATHETERIZATION WITH CORONARY ANGIOGRAM N/A 11/08/2013   Procedure: LEFT AND RIGHT HEART CATHETERIZATION WITH CORONARY ANGIOGRAM;  Surgeon: [name], MD;  Location[facility] C[facility] LAB;  Service: Cardiovascular;  Laterality: N/A;  . MENISCUS REPAIR  2009  . MOUTH SURGERY     teeth implants  . PORT-A-CATH REMOVAL N/A 01/24/2017   Procedure: REMOVAL PORT-A-CATH;  Surgeon: Fanny Skates, MD;  Location: [facility];  Service: General;  Laterality: N/A;  . PORTACATH PLACEMENT Right 07/16/2016   Procedure: INSERTION PORT-A-CATH RIGHT INTERNAL JUGULAR WITH ULTRASOUND;  Surgeon: Fanny Skates, MD;  Location: [place];  Service: General;  Laterality: Right;  . TOTAL KNEE ARTHROPLASTY  2011   left  . TOTAL KNEE ARTHROPLASTY Right 05/10/2014   Procedure: RIGHT TOTAL KNEE ARTHROPLASTY;  Surgeon: Mauri Pole, MD;  Location: [facility];  Service: Orthopedics;  Laterality: Right;  . TUBAL LIGATION    . WISDOM TOOTH EXTRACTION   2000    SOCIAL HISTORY: Social History   Tobacco Use  . Smoking status: Never Smoker  . Smokeless tobacco: Never Used  Substance Use Topics  . Alcohol use: Not Currently    Comment: rarely  . Drug use: No    FAMILY HISTORY: Family  History  Problem Relation Age of Onset  . Thyroid disease Mother   . Hyperlipidemia Mother   . Depression Mother   . Anxiety disorder Mother   . Heart attack Father 50  . Hypertension Father   . Arthritis Father        RA  . Coronary artery disease Father   . High Cholesterol Father   . Coronary artery disease Brother   . Heart disease Brother   . Cancer Maternal Aunt        colon  . Cancer Maternal Grandmother        colon  . Heart attack Maternal Grandfather   . Alcohol abuse Paternal Grandfather     ROS: Review of Systems  Gastrointestinal: Negative for nausea and vomiting.  Musculoskeletal:       Negative for muscle weakness.    PHYSICAL EXAM: Pt in no acute distress  RECENT LABS AND TESTS: BMET    Component Value Date/Time   NA 142 07/02/2018 1230   NA 145 04/11/2017 0942   K 5.0 07/02/2018 1230   K 4.4 04/11/2017 0942   CL 100 07/02/2018 1230   CO2 22 07/02/2018 1230   CO2 26 04/11/2017 0942   GLUCOSE 118 (H) 07/02/2018 1230   GLUCOSE 221 (H) 12/22/2017 0907   GLUCOSE 156 (H) 04/11/2017 0942   BUN 24 07/02/2018 1230   BUN 15.1 04/11/2017 0942   CREATININE 0.80 07/02/2018 1230   CREATININE 0.87 10/06/2017 1010   CREATININE 0.8 04/11/2017 0942   CALCIUM 10.3 07/02/2018 1230   CALCIUM 10.0 04/11/2017 0942   GFRNONAA 77 07/02/2018 1230   GFRNONAA >60 10/06/2017 1010   GFRAA 88 07/02/2018 1230   GFRAA >60 10/06/2017 1010   Lab Results  Component Value Date   HGBA1C 6.3 (H) 07/02/2018   HGBA1C 9.5 (H) 03/26/2018   HGBA1C 8.7 (H) 12/22/2017   HGBA1C 7.5 (H) 09/22/2017   HGBA1C 7.2 (H) 05/22/2017   Lab Results  Component Value Date   INSULIN 59.3 (H) 07/02/2018   INSULIN 57.0 (H) 03/26/2018   CBC     Component Value Date/Time   WBC 9.0 03/26/2018 1024   WBC 7.5 12/22/2017 0907   RBC 4.95 03/26/2018 1024   RBC 4.87 12/22/2017 0907   HGB 12.4 03/26/2018 1024   HGB 12.2 04/11/2017 0942   HCT 40.3 03/26/2018 1024   HCT 39.1 04/11/2017 0942   PLT 282.0 12/22/2017 0907   PLT 274 10/06/2017 1010   PLT 240 04/11/2017 0942   MCV 81 03/26/2018 1024   MCV 81.6 04/11/2017 0942   MCH 25.1 (L) 03/26/2018 1024   MCH 25.6 10/06/2017 1010   MCHC 30.8 (L) 03/26/2018 1024   MCHC 31.1 12/22/2017 0907   RDW 17.1 (H) 03/26/2018 1024   RDW 19.2 (H) 04/11/2017 0942   LYMPHSABS 1.5 03/26/2018 1024   LYMPHSABS 1.4 04/11/2017 0942   MONOABS 0.5 12/22/2017 0907   MONOABS 0.3 04/11/2017 0942   EOSABS 0.0 03/26/2018 1024   BASOSABS 0.0 03/26/2018 1024   BASOSABS 0.0 04/11/2017 0942   Iron/TIBC/Ferritin/ %Sat No results found for: IRON, TIBC, FERRITIN, IRONPCTSAT Lipid Panel     Component Value Date/Time   CHOL 126 03/26/2018 1024   TRIG 188 (H) 03/26/2018 1024   HDL 34 (L) 03/26/2018 1024   CHOLHDL 3 12/22/2017 0907   VLDL 33.2 12/22/2017 0907   LDLCALC 54 03/26/2018 1024   LDLDIRECT 108.0 10/08/2016 0849   Hepatic Function Panel     Component Value Date/Time  PROT 7.1 07/02/2018 1230   PROT 7.5 04/11/2017 0942   ALBUMIN 4.1 07/02/2018 1230   ALBUMIN 3.6 04/11/2017 0942   AST 19 07/02/2018 1230   AST 33 10/06/2017 1010   AST 41 (H) 04/11/2017 0942   ALT 24 07/02/2018 1230   ALT 30 10/06/2017 1010   ALT 40 04/11/2017 0942   ALKPHOS 136 (H) 07/02/2018 1230   ALKPHOS 93 04/11/2017 0942   BILITOT 0.3 07/02/2018 1230   BILITOT 0.5 10/06/2017 1010   BILITOT 0.48 04/11/2017 0942   BILIDIR 0.15 06/19/2017 0950   IBILI 0.3 05/24/2016 0828      Component Value Date/Time   TSH 2.130 03/26/2018 1024   TSH 1.87 12/22/2017 0907   TSH 0.81 09/22/2017 0850   Results for Goodchild, Rajah A (MRN [id number]) as of 10/13/2018 15:45  Ref. Range 07/02/2018 12:30  Vitamin D, 25-Hydroxy Latest Ref  Range: 30.0 - 100.0 ng/mL 31.4     I, Marcille Blanco, CMA, am acting as Location manager for [employer]. Owens Shark, DO.  I have reviewed the above documentation for accuracy and completeness, and I agree with the above. -Jearld Lesch, DO

## 2018-10-14 ENCOUNTER — Other Ambulatory Visit: Payer: Self-pay | Admitting: Family Medicine

## 2018-10-14 MED ORDER — DICLOFENAC SODIUM 75 MG PO TBEC: 75.0000 mg | DELAYED_RELEASE_TABLET | Freq: Two times a day (BID) | ORAL | 1 refills | Status: DC

## 2018-10-14 MED ORDER — METFORMIN HCL 500 MG PO TABS: 500.0000 mg | ORAL_TABLET | Freq: Two times a day (BID) | ORAL | 0 refills | Status: DC

## 2018-10-14 NOTE — Telephone Encounter (Signed)
Requested Prescriptions  Pending Prescriptions Disp Refills  . diclofenac (VOLTAREN) 75 MG EC tablet 180 tablet 1    Sig: Take 1 tablet (75 mg total) by mouth 2 (two) times daily.     Analgesics:  NSAIDS Passed - 10/14/2018  4:33 PM      Passed - Cr in normal range and within 360 days    Creatinine  Date Value Ref Range Status  10/06/2017 0.87 0.60 - 1.10 mg/dL Final  04/11/2017 0.8 0.6 - 1.1 mg/dL Final   Creatinine, Ser  Date Value Ref Range Status  07/02/2018 0.80 0.57 - 1.00 mg/dL Final         Passed - HGB in normal range and within 360 days    Hemoglobin  Date Value Ref Range Status  03/26/2018 12.4 11.1 - 15.9 g/dL Final   HGB  Date Value Ref Range Status  04/11/2017 12.2 11.6 - 15.9 g/dL Final         Passed - Patient is not pregnant      Passed - Valid encounter within last 12 months    Recent Outpatient Visits          6 months ago Essential hypertension   [facility] [employer], Bonnita Levan, MD   7 months ago Acute non-recurrent maxillary sinusitis   [place], [place], DO   9 months ago Anxiety and depression   [facility] [employer], Erline Levine A, MD   1 year ago Type 2 diabetes mellitus with hyperglycemia, without long-term current use of insulin ([place])   [facility] at [place], MD   1 year ago Hyperlipidemia associated with type 2 diabetes mellitus ([place])   [facility] [employer], Bonnita Levan, MD      Future Appointments            In 75 months Vevelyn Royals, Parthenia Ames, Research scientist (physical sciences) at [employer], [facility]         . metFORMIN (GLUCOPHAGE) 500 MG tablet 180 tablet 0    Sig: Take 1 tablet (500 mg total) by mouth 2 (two) times daily with a meal.     Endocrinology:  Diabetes - Biguanides Passed - 10/14/2018  4:33 PM      Passed - Cr in normal range and  within 360 days    Creatinine  Date Value Ref Range Status  10/06/2017 0.87 0.60 - 1.10 mg/dL Final  04/11/2017 0.8 0.6 - 1.1 mg/dL Final   Creatinine, Ser  Date Value Ref Range Status  07/02/2018 0.80 0.57 - 1.00 mg/dL Final         Passed - HBA1C is between 0 and 7.9 and within 180 days    Hgb A1c MFr Bld  Date Value Ref Range Status  07/02/2018 6.3 (H) 4.8 - 5.6 % Final    Comment:             Prediabetes: 5.7 - 6.4          Diabetes: >6.4          Glycemic control for adults with diabetes: <7.0          Passed - eGFR in normal range and within 360 days    GFR, Est AFR Am  Date Value Ref Range Status  10/06/2017 >60 >60 mL/min Final    Comment:    (  NOTE) The eGFR has been calculated using the CKD EPI equation. This calculation has not been validated in all clinical situations. eGFR's persistently <60 mL/min signify possible Chronic Kidney Disease.    GFR calc Af Amer  Date Value Ref Range Status  07/02/2018 88 >59 mL/min/1.73 Final   GFR, Est Non Af Am  Date Value Ref Range Status  10/06/2017 >60 >60 mL/min Final   GFR calc non Af Amer  Date Value Ref Range Status  07/02/2018 77 >59 mL/min/1.73 Final   GFR  Date Value Ref Range Status  12/22/2017 79.45 >60.00 mL/min Final         Passed - Valid encounter within last 6 months    Recent Outpatient Visits          6 months ago Essential hypertension   [facility] at [place], MD   7 months ago Acute non-recurrent maxillary sinusitis   [place], Renee A, DO   9 months ago Anxiety and depression   [facility] at [place], MD   1 year ago Type 2 diabetes mellitus with hyperglycemia, without long-term current use of insulin ([place])   [facility]ivist at [place], MD   1 year ago Hyperlipidemia associated with type 2 diabetes mellitus ([place])    Archivist at [place], MD      Future Appointments            In 50 months Vevelyn Royals, Parthenia Ames, [place] at [employer], [place]

## 2018-10-27 ENCOUNTER — Ambulatory Visit (INDEPENDENT_AMBULATORY_CARE_PROVIDER_SITE_OTHER): Payer: Medicare HMO | Admitting: Bariatrics

## 2018-10-27 ENCOUNTER — Encounter (INDEPENDENT_AMBULATORY_CARE_PROVIDER_SITE_OTHER): Payer: Self-pay | Admitting: Bariatrics

## 2018-10-27 ENCOUNTER — Other Ambulatory Visit: Payer: Self-pay

## 2018-10-27 DIAGNOSIS — E559 Vitamin D deficiency, unspecified: Secondary | ICD-10-CM

## 2018-10-27 DIAGNOSIS — Z6841 Body Mass Index (BMI) 40.0 and over, adult: Secondary | ICD-10-CM | POA: Diagnosis not present

## 2018-10-27 DIAGNOSIS — E119 Type 2 diabetes mellitus without complications: Secondary | ICD-10-CM

## 2018-10-28 NOTE — Progress Notes (Signed)
Office: [phone]  /  Fax: [phone] TeleHealth Visit:  [name] has verbally consented to this TeleHealth visit today. The patient is located at home, the provider is located at the [facility] office. The participants in this visit include the listed provider and patient and any and all parties involved. The visit was conducted today via FaceTime.  HPI:   Chief Complaint: OBESITY [name] is here to discuss her progress with her obesity treatment plan. She is on the Category 4 plan and is following her eating plan approximately 70 % of the time. She states she is walking 10 to 15 minutes 3 times per week. [name] is doing well overall. She states that she has lost 3 pounds (weight 271 lbs). She got a scale for weighing herself. She has had some stress. We were unable to weigh the patient today for this TeleHealth visit. She feels as if she has lost weight since her last visit. She has lost 52 lbs since starting treatment with [name].  Diabetes II [name] has a diagnosis of diabetes type II. She is taking Metformin and Ozempic. [name] states fasting BGs average 100 and her hunger is better. Last A1c was at 6.3 and last insulin level was at 59.3 She has been working on intensive lifestyle modifications including diet, exercise, and weight loss to help control her blood glucose levels.  Vitamin D deficiency [name] has a diagnosis of vitamin D deficiency. She is currently taking vit D and denies nausea, vomiting or muscle weakness.  ASSESSMENT AND PLAN:  Type 2 diabetes mellitus without complication, without long-term current use of insulin (HCC)  Vitamin D deficiency  Class 3 severe obesity with serious comorbidity and body mass index (BMI) of 45.0 to 49.9 in adult, unspecified obesity type ([place])  PLAN:  Diabetes II [name] has been given extensive diabetes education by myself today including ideal fasting and post-prandial blood glucose readings, individual  ideal Hgb A1c goals and hypoglycemia prevention. We discussed the importance of good blood sugar control to decrease the likelihood of diabetic complications such as nephropathy, neuropathy, limb loss, blindness, coronary artery disease, and death. We discussed the importance of intensive lifestyle modification including diet, exercise and weight loss as the first line treatment for diabetes. [name] agrees to continue her diabetes medications and will follow up at the agreed upon time.  Vitamin D Deficiency [name] was informed that low vitamin D levels contributes to fatigue and are associated with obesity, breast, and colon cancer. She agrees to continue to take prescription Vit D @50 ,000 IU every week and will follow up for routine testing of vitamin D, at least 2-3 times per year. She was informed of the risk of over-replacement of vitamin D and agrees to not increase her dose unless she discusses this with [name] first.  Obesity [name] is currently in the action stage of change. As such, her goal is to continue with weight loss efforts She has agreed to follow the Category 4 plan [name] will continue exercise and increase exercise for weight loss and overall health benefits. We discussed the following Behavioral Modification Strategies today: planning for success, increase H2O intake, no skipping meals, keeping healthy foods in the home, increasing lean protein intake, decreasing simple carbohydrates, increasing vegetables, decrease eating out and work on meal planning and intentional eating. [name] will weigh herself at home and record before each visit.  [name] has agreed to follow up with our clinic in 2 weeks. She was informed of the importance  of frequent follow up visits to maximize her success with intensive lifestyle modifications for her multiple health conditions.  ALLERGIES: Allergies  Allergen Reactions  . No Known Allergies     MEDICATIONS: Current Outpatient Medications on  File Prior to Visit  Medication Sig Dispense Refill  . acetaminophen (TYLENOL) 500 MG tablet Take 1,000 mg by mouth 2 (two) times daily as needed for moderate pain or headache.    . albuterol (PROVENTIL HFA;VENTOLIN HFA) 108 (90 Base) MCG/ACT inhaler Inhale 2 puffs into the lungs every 6 (six) hours as needed for wheezing or shortness of breath. 1 Inhaler 12  . anastrozole (ARIMIDEX) 1 MG tablet TAKE 1 TABLET (1 MG TOTAL) BY MOUTH DAILY. 90 tablet 3  . aspirin EC 81 MG tablet Take 81 mg by mouth at bedtime.     Marland Kitchen BREO ELLIPTA 100-25 MCG/INH AEPB INHALE 1 PUFF INTO LUNGS ONCE A DAY 60 each 0  . BYSTOLIC 10 MG tablet TAKE 1 TABLET (10 MG TOTAL) BY MOUTH AT BEDTIME. 90 tablet 2  . diclofenac (VOLTAREN) 75 MG EC tablet Take 1 tablet (75 mg total) by mouth 2 (two) times daily. 180 tablet 1  . diltiazem (CARDIZEM CD) 120 MG 24 hr capsule TAKE 1 CAPSULE (120 MG TOTAL) BY MOUTH DAILY. 90 capsule 2  . escitalopram (LEXAPRO) 20 MG tablet TAKE 1 TABLET (20 MG TOTAL) BY MOUTH DAILY. 90 tablet 1  . esomeprazole (NEXIUM) 40 MG capsule TAKE 1 CAPSULE EVERY DAY 90 capsule 2  . ezetimibe (ZETIA) 10 MG tablet TAKE 1 TABLET EVERY DAY 90 tablet 3  . fluticasone (FLONASE) 50 MCG/ACT nasal spray Place 2 sprays into both nostrils daily. 16 g 6  . furosemide (LASIX) 20 MG tablet TAKE 1 TABLET (20 MG TOTAL) BY MOUTH DAILY. 90 tablet 2  . glucose blood (ACCU-CHEK AVIVA) test strip Twice daily (please use brand that patient insurance covers) 100 each 5  . Hypromellose (ARTIFICIAL TEARS OP) Place 1 drop into both eyes daily as needed (dry eyes).     Marland Kitchen loratadine (CLARITIN) 10 MG tablet Take 1 tablet (10 mg total) by mouth daily. 30 tablet 11  . losartan (COZAAR) 100 MG tablet TAKE 1 TABLET EVERY DAY 90 tablet 2  . metFORMIN (GLUCOPHAGE) 500 MG tablet Take 1 tablet (500 mg total) by mouth 2 (two) times daily with a meal. 180 tablet 0  . montelukast (SINGULAIR) 10 MG tablet TAKE 1 TABLET (10 MG TOTAL) BY MOUTH AT BEDTIME. 90  tablet 2  . Probiotic Product (PROBIOTIC PO) Take 1 capsule by mouth at bedtime.    . ranitidine (ZANTAC) 150 MG tablet TAKE 1 TABLET (150 MG TOTAL) BY MOUTH AT BEDTIME. 90 tablet 1  . rosuvastatin (CRESTOR) 40 MG tablet TAKE 1 TABLET EVERY DAY 90 tablet 3  . Semaglutide,0.25 or 0.5MG /DOS, (OZEMPIC, 0.25 OR 0.5 MG/DOSE,) 2 MG/1.5ML SOPN Inject 0.5 mg into the skin once a week. 4 pen 0  . Vitamin D, Ergocalciferol, (DRISDOL) 1.25 MG (50000 UT) CAPS capsule Take 1 capsule (50,000 Units total) by mouth every 7 (seven) days. 2 capsule 0   No current facility-administered medications on file prior to visit.     PAST MEDICAL HISTORY: Past Medical History:  Diagnosis Date  . Anemia 04/05/2012  . Anxiety   . Anxiety state 09/11/2007   Qualifier: Diagnosis of  By: Scherrie Gerlach    . Asthma   . Atrial tachycardia, paroxysmal ([place])   . Back pain 10/02/2014  . Breast cancer (  [place]) 02/17/2017  . Chicken pox as a child  . Chronic diastolic CHF (congestive heart failure), NYHA class 1 ([place])   . Complication of anesthesia    pt states feels different in her body after anesthesia when waking up and also experiences a smell of burnt plastic for approx a wk   . Constipation   . Depression   . Diabetes ([place])   . Dilated aortic root ([place])    16mm by echo 09/2016  . Dysuria 02/17/2017  . Elevated LFTs 04/05/2012  . GERD (gastroesophageal reflux disease)   . Goiter   . Heart murmur    hx of one at birth   . History of kidney stones   . History of radiation therapy 10/31/16-12/17/16   left breast 45 Gy in 25 fractions, left breast boost 16 Gy in 8 fractions  . Hx of colonic polyps   . Hyperlipidemia   . Hypertension   . Insomnia 10/08/2016  . Joint pain   . Malignant neoplasm of upper-outer quadrant of left female breast ([place]) 05/14/2016   not with patient  . Measles as a child  . Mumps as a child  . OA (osteoarthritis) of knee 04/05/2012  . Obesity 07/11/2014  . PONV (postoperative nausea and  vomiting)   . Preventative health care 09/05/2013  . PVC's (premature ventricular contractions) 05/02/2016  . Shortness of breath dyspnea    walking distances / climbing stairs  . Sleep apnea 04/05/2012  . Tinea corporis 02/23/2013  . Type 2 diabetes mellitus with hyperglycemia ([place]) 12/31/2013  . Vasomotor rhinitis 04/05/2012  . Ventral hernia   . Wheezing     PAST SURGICAL HISTORY: Past Surgical History:  Procedure Laterality Date  . BREAST LUMPECTOMY Left 2017  . BREAST LUMPECTOMY WITH RADIOACTIVE SEED AND SENTINEL LYMPH NODE BIOPSY Left 06/21/2016   Procedure: LEFT BREAST LUMPECTOMY WITH RADIOACTIVE SEED AND SENTINEL LYMPH NODE BIOPSY, INJECT BLUE DYE LEFT BREAST;  Surgeon: Fanny Skates, MD;  Location: [facility];  Service: General;  Laterality: Left;  . CARDIAC CATHETERIZATION     normal coroary arteries per patient  . CARDIOVASCULAR STRESS TEST     10/12/2013  . CESAREAN SECTION     X 3  . CHOLECYSTECTOMY    . COLONOSCOPY WITH PROPOFOL N/A 03/28/2015   Procedure: COLONOSCOPY WITH PROPOFOL;  Surgeon: Juanita Craver, MD;  Location: [facility];  Service: Endoscopy;  Laterality: N/A;  . HERNIA REPAIR  02/08/11   ventral hernia  . JOINT REPLACEMENT     bilateral  . KNEE ARTHROSCOPY  05/2010   bilateral  . LEFT AND RIGHT HEART CATHETERIZATION WITH CORONARY ANGIOGRAM N/A 11/08/2013   Procedure: LEFT AND RIGHT HEART CATHETERIZATION WITH CORONARY ANGIOGRAM;  Surgeon: [name], MD;  Location: [facility] C[facility] LAB;  Service: Cardiovascular;  Laterality: N/A;  . MENISCUS REPAIR  2009  . MOUTH SURGERY     teeth implants  . PORT-A-CATH REMOVAL N/A 01/24/2017   Procedure: REMOVAL PORT-A-CATH;  Surgeon: Fanny Skates, MD;  Location: [facility];  Service: General;  Laterality: N/A;  . PORTACATH PLACEMENT Right 07/16/2016   Procedure: INSERTION PORT-A-CATH RIGHT INTERNAL JUGULAR WITH ULTRASOUND;  Surgeon: Fanny Skates, MD;  Location: [facility];  Service: General;  Laterality: Right;  . TOTAL KNEE  ARTHROPLASTY  2011   left  . TOTAL KNEE ARTHROPLASTY Right 05/10/2014   Procedure: RIGHT TOTAL KNEE ARTHROPLASTY;  Surgeon: Mauri Pole, MD;  Location: [facility];  Service: Orthopedics;  Laterality: Right;  . TUBAL LIGATION    .  WISDOM TOOTH EXTRACTION  2000    SOCIAL HISTORY: Social History   Tobacco Use  . Smoking status: Never Smoker  . Smokeless tobacco: Never Used  Substance Use Topics  . Alcohol use: Not Currently    Comment: rarely  . Drug use: No    FAMILY HISTORY: Family History  Problem Relation Age of Onset  . Thyroid disease Mother   . Hyperlipidemia Mother   . Depression Mother   . Anxiety disorder Mother   . Heart attack Father 66  . Hypertension Father   . Arthritis Father        RA  . Coronary artery disease Father   . High Cholesterol Father   . Coronary artery disease Brother   . Heart disease Brother   . Cancer Maternal Aunt        colon  . Cancer Maternal Grandmother        colon  . Heart attack Maternal Grandfather   . Alcohol abuse Paternal Grandfather     ROS: Review of Systems  Constitutional: Positive for weight loss.  Gastrointestinal: Negative for nausea and vomiting.  Musculoskeletal:       Negative for muscle weakness    PHYSICAL EXAM: Pt in no acute distress  RECENT LABS AND TESTS: BMET    Component Value Date/Time   NA 142 07/02/2018 1230   NA 145 04/11/2017 0942   K 5.0 07/02/2018 1230   K 4.4 04/11/2017 0942   CL 100 07/02/2018 1230   CO2 22 07/02/2018 1230   CO2 26 04/11/2017 0942   GLUCOSE 118 (H) 07/02/2018 1230   GLUCOSE 221 (H) 12/22/2017 0907   GLUCOSE 156 (H) 04/11/2017 0942   BUN 24 07/02/2018 1230   BUN 15.1 04/11/2017 0942   CREATININE 0.80 07/02/2018 1230   CREATININE 0.87 10/06/2017 1010   CREATININE 0.8 04/11/2017 0942   CALCIUM 10.3 07/02/2018 1230   CALCIUM 10.0 04/11/2017 0942   GFRNONAA 77 07/02/2018 1230   GFRNONAA >60 10/06/2017 1010   GFRAA 88 07/02/2018 1230   GFRAA >60 10/06/2017 1010    Lab Results  Component Value Date   HGBA1C 6.3 (H) 07/02/2018   HGBA1C 9.5 (H) 03/26/2018   HGBA1C 8.7 (H) 12/22/2017   HGBA1C 7.5 (H) 09/22/2017   HGBA1C 7.2 (H) 05/22/2017   Lab Results  Component Value Date   INSULIN 59.3 (H) 07/02/2018   INSULIN 57.0 (H) 03/26/2018   CBC    Component Value Date/Time   WBC 9.0 03/26/2018 1024   WBC 7.5 12/22/2017 0907   RBC 4.95 03/26/2018 1024   RBC 4.87 12/22/2017 0907   HGB 12.4 03/26/2018 1024   HGB 12.2 04/11/2017 0942   HCT 40.3 03/26/2018 1024   HCT 39.1 04/11/2017 0942   PLT 282.0 12/22/2017 0907   PLT 274 10/06/2017 1010   PLT 240 04/11/2017 0942   MCV 81 03/26/2018 1024   MCV 81.6 04/11/2017 0942   MCH 25.1 (L) 03/26/2018 1024   MCH 25.6 10/06/2017 1010   MCHC 30.8 (L) 03/26/2018 1024   MCHC 31.1 12/22/2017 0907   RDW 17.1 (H) 03/26/2018 1024   RDW 19.2 (H) 04/11/2017 0942   LYMPHSABS 1.5 03/26/2018 1024   LYMPHSABS 1.4 04/11/2017 0942   MONOABS 0.5 12/22/2017 0907   MONOABS 0.3 04/11/2017 0942   EOSABS 0.0 03/26/2018 1024   BASOSABS 0.0 03/26/2018 1024   BASOSABS 0.0 04/11/2017 0942   Iron/TIBC/Ferritin/ %Sat No results found for: IRON, TIBC, FERRITIN, IRONPCTSAT Lipid Panel  Component Value Date/Time   CHOL 126 03/26/2018 1024   TRIG 188 (H) 03/26/2018 1024   HDL 34 (L) 03/26/2018 1024   CHOLHDL 3 12/22/2017 0907   VLDL 33.2 12/22/2017 0907   LDLCALC 54 03/26/2018 1024   LDLDIRECT 108.0 10/08/2016 0849   Hepatic Function Panel     Component Value Date/Time   PROT 7.1 07/02/2018 1230   PROT 7.5 04/11/2017 0942   ALBUMIN 4.1 07/02/2018 1230   ALBUMIN 3.6 04/11/2017 0942   AST 19 07/02/2018 1230   AST 33 10/06/2017 1010   AST 41 (H) 04/11/2017 0942   ALT 24 07/02/2018 1230   ALT 30 10/06/2017 1010   ALT 40 04/11/2017 0942   ALKPHOS 136 (H) 07/02/2018 1230   ALKPHOS 93 04/11/2017 0942   BILITOT 0.3 07/02/2018 1230   BILITOT 0.5 10/06/2017 1010   BILITOT 0.48 04/11/2017 0942   BILIDIR 0.15  06/19/2017 0950   IBILI 0.3 05/24/2016 0828      Component Value Date/Time   TSH 2.130 03/26/2018 1024   TSH 1.87 12/22/2017 0907   TSH 0.81 09/22/2017 0850     Ref. Range 07/02/2018 12:30  Vitamin D, 25-Hydroxy Latest Ref Range: 30.0 - 100.0 ng/mL 31.4    I, Doreene Nest, am acting as Location manager for [employer]. Owens Shark, DO  I have reviewed the above documentation for accuracy and completeness, and I agree with the above. -Jearld Lesch, DO

## 2018-11-10 ENCOUNTER — Ambulatory Visit (INDEPENDENT_AMBULATORY_CARE_PROVIDER_SITE_OTHER): Payer: Medicare HMO | Admitting: Bariatrics

## 2018-11-10 ENCOUNTER — Telehealth: Payer: Self-pay | Admitting: Family Medicine

## 2018-11-10 ENCOUNTER — Other Ambulatory Visit: Payer: Self-pay

## 2018-11-10 ENCOUNTER — Encounter (INDEPENDENT_AMBULATORY_CARE_PROVIDER_SITE_OTHER): Payer: Self-pay | Admitting: Bariatrics

## 2018-11-10 DIAGNOSIS — E118 Type 2 diabetes mellitus with unspecified complications: Secondary | ICD-10-CM | POA: Diagnosis not present

## 2018-11-10 DIAGNOSIS — E559 Vitamin D deficiency, unspecified: Secondary | ICD-10-CM | POA: Diagnosis not present

## 2018-11-10 DIAGNOSIS — Z6841 Body Mass Index (BMI) 40.0 and over, adult: Secondary | ICD-10-CM | POA: Diagnosis not present

## 2018-11-10 NOTE — Progress Notes (Signed)
Office: [phone]  /  Fax: [phone] TeleHealth Visit:  [name] has verbally consented to this TeleHealth visit today. The patient is located at home, the provider is located at the [facility] office. The participants in this visit include the listed provider and patient. The visit was conducted today via face time.  HPI:   Chief Complaint: OBESITY [name] is here to discuss her progress with her obesity treatment plan. She is on the Category 4 plan and is following her eating plan approximately 50 % of the time. She states she is walking and planting for 10 minutes 3-4 times per week. [name] states that she has lost 1 lb (weight of 269 lbs). She has been stressed because she cannot see her grandchildren.  We were unable to weigh the patient today for this TeleHealth visit. She feels as if she has lost 1 lb since her last visit. She has lost 52-53 lbs since starting treatment with [name].  Vitamin D Deficiency Shakthi has a diagnosis of vitamin D deficiency. She is currently taking high dose prescription Vit D. She denies nausea, vomiting or muscle weakness.  Type 2 Diabetes [name] has a diagnosis of pre-diabetes based on her elevated Hgb A1c at 6.3 and insulin at 59.3. She was informed this puts her at greater risk of developing diabetes. She is taking Glucophage and Ozempic and continues to work on diet and exercise to decrease risk of diabetes. She denies nausea or hypoglycemia.  ASSESSMENT AND PLAN:  Vitamin D deficiency  Prediabetes  Class 3 severe obesity with serious comorbidity and body mass index (BMI) of 45.0 to 49.9 in adult, unspecified obesity type ([place])  PLAN:  Vitamin D Deficiency [name] was informed that low vitamin D levels contributes to fatigue and are associated with obesity, breast, and colon cancer. [name] agrees to continue taking high dose prescription Vit D 50,000 IU every week and will follow up for routine testing of vitamin  D, at least 2-3 times per year. She was informed of the risk of over-replacement of vitamin D and agrees to not increase her dose unless she discusses this with [name] first. [name] agrees to follow up with our clinic in 2 weeks.  Diabetes type 2 [name] will continue to work on weight loss, exercise, and decreasing simple carbohydrates in her diet to help decrease the risk of diabetes. We dicussed metformin including benefits and risks. She was informed that eating too many simple carbohydrates or too many calories at one sitting increases the likelihood of GI side effects. [name] agrees to continue her medications, and she agrees to follow up with our clinic in 2 weeks as directed to monitor her progress.  Obesity [name] is currently in the action stage of change. As such, her goal is to continue with weight loss efforts She has agreed to follow the Category [place] has been instructed to work up to a goal of 150 minutes of combined cardio and strengthening exercise per week or continue current exercise for weight loss and overall health benefits. We discussed the following Behavioral Modification Strategies today: increasing lean protein intake, decreasing simple carbohydrates, increasing vegetables, decrease eating out, work on meal planning and easy cooking plans, emotional eating strategies, increase H20 intake, no skipping meals, keeping healthy foods in the home, and better snacking choices [name] will weigh herself before each visit and keep a record.  [name] has agreed to follow up with our clinic in 2 weeks. She was informed of the  importance of frequent follow up visits to maximize her success with intensive lifestyle modifications for her multiple health conditions.  ALLERGIES: Allergies  Allergen Reactions  . No Known Allergies     MEDICATIONS: Current Outpatient Medications on File Prior to Visit  Medication Sig Dispense Refill  . acetaminophen (TYLENOL) 500 MG tablet  Take 1,000 mg by mouth 2 (two) times daily as needed for moderate pain or headache.    . albuterol (PROVENTIL HFA;VENTOLIN HFA) 108 (90 Base) MCG/ACT inhaler Inhale 2 puffs into the lungs every 6 (six) hours as needed for wheezing or shortness of breath. 1 Inhaler 12  . anastrozole (ARIMIDEX) 1 MG tablet TAKE 1 TABLET (1 MG TOTAL) BY MOUTH DAILY. 90 tablet 3  . aspirin EC 81 MG tablet Take 81 mg by mouth at bedtime.     Marland Kitchen BREO ELLIPTA 100-25 MCG/INH AEPB INHALE 1 PUFF INTO LUNGS ONCE A DAY 60 each 0  . BYSTOLIC 10 MG tablet TAKE 1 TABLET (10 MG TOTAL) BY MOUTH AT BEDTIME. 90 tablet 2  . diclofenac (VOLTAREN) 75 MG EC tablet Take 1 tablet (75 mg total) by mouth 2 (two) times daily. 180 tablet 1  . diltiazem (CARDIZEM CD) 120 MG 24 hr capsule TAKE 1 CAPSULE (120 MG TOTAL) BY MOUTH DAILY. 90 capsule 2  . escitalopram (LEXAPRO) 20 MG tablet TAKE 1 TABLET (20 MG TOTAL) BY MOUTH DAILY. 90 tablet 1  . esomeprazole (NEXIUM) 40 MG capsule TAKE 1 CAPSULE EVERY DAY 90 capsule 2  . ezetimibe (ZETIA) 10 MG tablet TAKE 1 TABLET EVERY DAY 90 tablet 3  . fluticasone (FLONASE) 50 MCG/ACT nasal spray Place 2 sprays into both nostrils daily. 16 g 6  . furosemide (LASIX) 20 MG tablet TAKE 1 TABLET (20 MG TOTAL) BY MOUTH DAILY. 90 tablet 2  . glucose blood (ACCU-CHEK AVIVA) test strip Twice daily (please use brand that patient insurance covers) 100 each 5  . Hypromellose (ARTIFICIAL TEARS OP) Place 1 drop into both eyes daily as needed (dry eyes).     Marland Kitchen loratadine (CLARITIN) 10 MG tablet Take 1 tablet (10 mg total) by mouth daily. 30 tablet 11  . losartan (COZAAR) 100 MG tablet TAKE 1 TABLET EVERY DAY 90 tablet 2  . metFORMIN (GLUCOPHAGE) 500 MG tablet Take 1 tablet (500 mg total) by mouth 2 (two) times daily with a meal. 180 tablet 0  . montelukast (SINGULAIR) 10 MG tablet TAKE 1 TABLET (10 MG TOTAL) BY MOUTH AT BEDTIME. 90 tablet 2  . Probiotic Product (PROBIOTIC PO) Take 1 capsule by mouth at bedtime.    .  ranitidine (ZANTAC) 150 MG tablet TAKE 1 TABLET (150 MG TOTAL) BY MOUTH AT BEDTIME. 90 tablet 1  . rosuvastatin (CRESTOR) 40 MG tablet TAKE 1 TABLET EVERY DAY 90 tablet 3  . Semaglutide,0.25 or 0.5MG /DOS, (OZEMPIC, 0.25 OR 0.5 MG/DOSE,) 2 MG/1.5ML SOPN Inject 0.5 mg into the skin once a week. 4 pen 0  . Vitamin D, Ergocalciferol, (DRISDOL) 1.25 MG (50000 UT) CAPS capsule Take 1 capsule (50,000 Units total) by mouth every 7 (seven) days. 2 capsule 0   No current facility-administered medications on file prior to visit.     PAST MEDICAL HISTORY: Past Medical History:  Diagnosis Date  . Anemia 04/05/2012  . Anxiety   . Anxiety state 09/11/2007   Qualifier: Diagnosis of  By: Scherrie Gerlach    . Asthma   . Atrial tachycardia, paroxysmal ([place])   . Back pain 10/02/2014  . Breast  cancer ([place]) 02/17/2017  . Chicken pox as a child  . Chronic diastolic CHF (congestive heart failure), NYHA class 1 ([place])   . Complication of anesthesia    pt states feels different in her body after anesthesia when waking up and also experiences a smell of burnt plastic for approx a wk   . Constipation   . Depression   . Diabetes ([place])   . Dilated aortic root ([place])    78mm by echo 09/2016  . Dysuria 02/17/2017  . Elevated LFTs 04/05/2012  . GERD (gastroesophageal reflux disease)   . Goiter   . Heart murmur    hx of one at birth   . History of kidney stones   . History of radiation therapy 10/31/16-12/17/16   left breast 45 Gy in 25 fractions, left breast boost 16 Gy in 8 fractions  . Hx of colonic polyps   . Hyperlipidemia   . Hypertension   . Insomnia 10/08/2016  . Joint pain   . Malignant neoplasm of upper-outer quadrant of left female breast ([place]) 05/14/2016   not with patient  . Measles as a child  . Mumps as a child  . OA (osteoarthritis) of knee 04/05/2012  . Obesity 07/11/2014  . PONV (postoperative nausea and vomiting)   . Preventative health care 09/05/2013  . PVC's (premature ventricular  contractions) 05/02/2016  . Shortness of breath dyspnea    walking distances / climbing stairs  . Sleep apnea 04/05/2012  . Tinea corporis 02/23/2013  . Type 2 diabetes mellitus with hyperglycemia ([place]) 12/31/2013  . Vasomotor rhinitis 04/05/2012  . Ventral hernia   . Wheezing     PAST SURGICAL HISTORY: Past Surgical History:  Procedure Laterality Date  . BREAST LUMPECTOMY Left 2017  . BREAST LUMPECTOMY WITH RADIOACTIVE SEED AND SENTINEL LYMPH NODE BIOPSY Left 06/21/2016   Procedure: LEFT BREAST LUMPECTOMY WITH RADIOACTIVE SEED AND SENTINEL LYMPH NODE BIOPSY, INJECT BLUE DYE LEFT BREAST;  Surgeon: Fanny Skates, MD;  Location: [facility];  Service: General;  Laterality: Left;  . CARDIAC CATHETERIZATION     normal coroary arteries per patient  . CARDIOVASCULAR STRESS TEST     10/12/2013  . CESAREAN SECTION     X 3  . CHOLECYSTECTOMY    . COLONOSCOPY WITH PROPOFOL N/A 03/28/2015   Procedure: COLONOSCOPY WITH PROPOFOL;  Surgeon: Juanita Craver, MD;  Location: [facility];  Service: Endoscopy;  Laterality: N/A;  . HERNIA REPAIR  02/08/11   ventral hernia  . JOINT REPLACEMENT     bilateral  . KNEE ARTHROSCOPY  05/2010   bilateral  . LEFT AND RIGHT HEART CATHETERIZATION WITH CORONARY ANGIOGRAM N/A 11/08/2013   Procedure: LEFT AND RIGHT HEART CATHETERIZATION WITH CORONARY ANGIOGRAM;  Surgeon: [name], MD;  Location: [facility] CATH LAB;  Service: Cardiovascular;  Laterality: N/A;  . MENISCUS REPAIR  2009  . MOUTH SURGERY     teeth implants  . PORT-A-CATH REMOVAL N/A 01/24/2017   Procedure: REMOVAL PORT-A-CATH;  Surgeon: Fanny Skates, MD;  Location: [facility];  Service: General;  Laterality: N/A;  . PORTACATH PLACEMENT Right 07/16/2016   Procedure: INSERTION PORT-A-CATH RIGHT INTERNAL JUGULAR WITH ULTRASOUND;  Surgeon: Fanny Skates, MD;  Location: [facility];  Service: General;  Laterality: Right;  . TOTAL KNEE ARTHROPLASTY  2011   left  . TOTAL KNEE ARTHROPLASTY Right 05/10/2014   Procedure:  RIGHT TOTAL KNEE ARTHROPLASTY;  Surgeon: Mauri Pole, MD;  Location: [facility];  Service: Orthopedics;  Laterality: Right;  . TUBAL LIGATION    .  WISDOM TOOTH EXTRACTION  2000    SOCIAL HISTORY: Social History   Tobacco Use  . Smoking status: Never Smoker  . Smokeless tobacco: Never Used  Substance Use Topics  . Alcohol use: Not Currently    Comment: rarely  . Drug use: No    FAMILY HISTORY: Family History  Problem Relation Age of Onset  . Thyroid disease Mother   . Hyperlipidemia Mother   . Depression Mother   . Anxiety disorder Mother   . Heart attack Father 59  . Hypertension Father   . Arthritis Father        RA  . Coronary artery disease Father   . High Cholesterol Father   . Coronary artery disease Brother   . Heart disease Brother   . Cancer Maternal Aunt        colon  . Cancer Maternal Grandmother        colon  . Heart attack Maternal Grandfather   . Alcohol abuse Paternal Grandfather     ROS: Review of Systems  Constitutional: Positive for weight loss.  Gastrointestinal: Negative for nausea and vomiting.  Musculoskeletal:       Negative muscle weakness  Endo/Heme/Allergies:       Negative hypoglycemia    PHYSICAL EXAM: Pt in no acute distress  RECENT LABS AND TESTS: BMET    Component Value Date/Time   NA 142 07/02/2018 1230   NA 145 04/11/2017 0942   K 5.0 07/02/2018 1230   K 4.4 04/11/2017 0942   CL 100 07/02/2018 1230   CO2 22 07/02/2018 1230   CO2 26 04/11/2017 0942   GLUCOSE 118 (H) 07/02/2018 1230   GLUCOSE 221 (H) 12/22/2017 0907   GLUCOSE 156 (H) 04/11/2017 0942   BUN 24 07/02/2018 1230   BUN 15.1 04/11/2017 0942   CREATININE 0.80 07/02/2018 1230   CREATININE 0.87 10/06/2017 1010   CREATININE 0.8 04/11/2017 0942   CALCIUM 10.3 07/02/2018 1230   CALCIUM 10.0 04/11/2017 0942   GFRNONAA 77 07/02/2018 1230   GFRNONAA >60 10/06/2017 1010   GFRAA 88 07/02/2018 1230   GFRAA >60 10/06/2017 1010   Lab Results  Component Value Date    HGBA1C 6.3 (H) 07/02/2018   HGBA1C 9.5 (H) 03/26/2018   HGBA1C 8.7 (H) 12/22/2017   HGBA1C 7.5 (H) 09/22/2017   HGBA1C 7.2 (H) 05/22/2017   Lab Results  Component Value Date   INSULIN 59.3 (H) 07/02/2018   INSULIN 57.0 (H) 03/26/2018   CBC    Component Value Date/Time   WBC 9.0 03/26/2018 1024   WBC 7.5 12/22/2017 0907   RBC 4.95 03/26/2018 1024   RBC 4.87 12/22/2017 0907   HGB 12.4 03/26/2018 1024   HGB 12.2 04/11/2017 0942   HCT 40.3 03/26/2018 1024   HCT 39.1 04/11/2017 0942   PLT 282.0 12/22/2017 0907   PLT 274 10/06/2017 1010   PLT 240 04/11/2017 0942   MCV 81 03/26/2018 1024   MCV 81.6 04/11/2017 0942   MCH 25.1 (L) 03/26/2018 1024   MCH 25.6 10/06/2017 1010   MCHC 30.8 (L) 03/26/2018 1024   MCHC 31.1 12/22/2017 0907   RDW 17.1 (H) 03/26/2018 1024   RDW 19.2 (H) 04/11/2017 0942   LYMPHSABS 1.5 03/26/2018 1024   LYMPHSABS 1.4 04/11/2017 0942   MONOABS 0.5 12/22/2017 0907   MONOABS 0.3 04/11/2017 0942   EOSABS 0.0 03/26/2018 1024   BASOSABS 0.0 03/26/2018 1024   BASOSABS 0.0 04/11/2017 0942   Iron/TIBC/Ferritin/ %Sat No results found for:  IRON, TIBC, FERRITIN, IRONPCTSAT Lipid Panel     Component Value Date/Time   CHOL 126 03/26/2018 1024   TRIG 188 (H) 03/26/2018 1024   HDL 34 (L) 03/26/2018 1024   CHOLHDL 3 12/22/2017 0907   VLDL 33.2 12/22/2017 0907   LDLCALC 54 03/26/2018 1024   LDLDIRECT 108.0 10/08/2016 0849   Hepatic Function Panel     Component Value Date/Time   PROT 7.1 07/02/2018 1230   PROT 7.5 04/11/2017 0942   ALBUMIN 4.1 07/02/2018 1230   ALBUMIN 3.6 04/11/2017 0942   AST 19 07/02/2018 1230   AST 33 10/06/2017 1010   AST 41 (H) 04/11/2017 0942   ALT 24 07/02/2018 1230   ALT 30 10/06/2017 1010   ALT 40 04/11/2017 0942   ALKPHOS 136 (H) 07/02/2018 1230   ALKPHOS 93 04/11/2017 0942   BILITOT 0.3 07/02/2018 1230   BILITOT 0.5 10/06/2017 1010   BILITOT 0.48 04/11/2017 0942   BILIDIR 0.15 06/19/2017 0950   IBILI 0.3 05/24/2016  0828      Component Value Date/Time   TSH 2.130 03/26/2018 1024   TSH 1.87 12/22/2017 0907   TSH 0.81 09/22/2017 0850      I, Trixie Dredge, am acting as transcriptionist for Jearld Lesch, DO  I have reviewed the above documentation for accuracy and completeness, and I agree with the above. Jearld Lesch, DO

## 2018-11-10 NOTE — Telephone Encounter (Signed)
Copied from [place] [id number]. Topic: Quick Communication - Rx Refill/Question >> Nov 10, 2018  5:23 PM Mcneil, Ja-Kwan wrote: Medication: escitalopram (LEXAPRO) 20 MG tablet  Has the patient contacted their pharmacy? no  Preferred Pharmacy (with phone number or street name): [facility] - [place] - [place] [phone] (Phone) [phone] (Fax)  Agent: Please be advised that RX refills may take up to 3 business days. We ask that you follow-up with your pharmacy.

## 2018-11-11 MED ORDER — ESCITALOPRAM OXALATE 20 MG PO TABS: 20.0000 mg | ORAL_TABLET | Freq: Every day | ORAL | 1 refills | Status: DC

## 2018-11-11 NOTE — Telephone Encounter (Signed)
Last visit 03/2018

## 2018-11-11 NOTE — Telephone Encounter (Signed)
Medication sent in. 

## 2018-11-24 ENCOUNTER — Other Ambulatory Visit: Payer: Self-pay

## 2018-11-24 ENCOUNTER — Ambulatory Visit (INDEPENDENT_AMBULATORY_CARE_PROVIDER_SITE_OTHER): Payer: Medicare HMO | Admitting: Bariatrics

## 2018-11-24 ENCOUNTER — Encounter (INDEPENDENT_AMBULATORY_CARE_PROVIDER_SITE_OTHER): Payer: Self-pay | Admitting: Bariatrics

## 2018-11-24 DIAGNOSIS — E119 Type 2 diabetes mellitus without complications: Secondary | ICD-10-CM

## 2018-11-24 DIAGNOSIS — Z6841 Body Mass Index (BMI) 40.0 and over, adult: Secondary | ICD-10-CM | POA: Diagnosis not present

## 2018-11-24 DIAGNOSIS — K5909 Other constipation: Secondary | ICD-10-CM

## 2018-11-24 DIAGNOSIS — E559 Vitamin D deficiency, unspecified: Secondary | ICD-10-CM

## 2018-11-25 NOTE — Progress Notes (Signed)
Office: [phone]  /  Fax: [phone] TeleHealth Visit:  [name] has verbally consented to this TeleHealth visit today. The patient is located at home, the provider is located at the [facility] office. The participants in this visit include the listed provider and patient and any and all parties involved. The visit was conducted today via FaceTime.  HPI:   Chief Complaint: OBESITY [name] is here to discuss her progress with her obesity treatment plan. She is on the Category 4 plan and is following her eating plan approximately 50 % of the time. She states she has been walking and cleaning for 60 minutes 4 times per week. [name] states that she has stayed the same or lost weight (weight 270 lbs). She is getting adequate protein. We were unable to weigh the patient today for this TeleHealth visit. She feels as if she has maintained or lost weight since her last visit. She has lost 53 lbs since starting treatment with [name].  Diabetes II [name] has a diagnosis of diabetes type II. [name] states fasting BGs range around 100 and she denies any lows. Last A1c was at 6.3 and last insulin level was at 59.3 She has been working on intensive lifestyle modifications including diet, exercise, and weight loss to help control her blood glucose levels.  Vitamin D deficiency [name] has a diagnosis of vitamin D deficiency. Her last vitamin D level was at 31.4 [name] is currently taking vit D and denies nausea, vomiting or muscle weakness.  Constipation [name] notes that her constipation has improved slightly. She has no retention of stool. She denies hematochezia or melena.   ASSESSMENT AND PLAN:  Type 2 diabetes mellitus without complication, without long-term current use of insulin (HCC)  Vitamin D deficiency  Other constipation  Class 3 severe obesity with serious comorbidity and body mass index (BMI) of 45.0 to 49.9 in adult, unspecified obesity type ([place])   PLAN:  Diabetes II Nirel has been given extensive diabetes education by myself today including ideal fasting and post-prandial blood glucose readings, individual ideal Hgb A1c goals and hypoglycemia prevention. We discussed the importance of good blood sugar control to decrease the likelihood of diabetic complications such as nephropathy, neuropathy, limb loss, blindness, coronary artery disease, and death. We discussed the importance of intensive lifestyle modification including diet, exercise and weight loss as the first line treatment for diabetes. [name] will continue glucophage and  follow up at the agreed upon time.  Vitamin D Deficiency [name] was informed that low vitamin D levels contributes to fatigue and are associated with obesity, breast, and colon cancer. She will continue to take prescription Vit D @50 ,000 IU every week and will follow up for routine testing of vitamin D, at least 2-3 times per year. She was informed of the risk of over-replacement of vitamin D and agrees to not increase her dose unless she discusses this with [name] first.  Constipation [name] was informed decrease bowel movement frequency is normal while losing weight, but stools should not be hard or painful. She will use Miralax and work on increasing her H20 intake and raw vegetables. [name] will use flaxseed or wheat  Bran. [name] agrees to follow up with our clinic in 2 weeks.  Obesity [name] is currently in the action stage of change. As such, her goal is to continue with weight loss efforts She has agreed to follow the Category 4 plan [name] has been instructed to work up to a goal of 150 minutes  of combined cardio and strengthening exercise per week for weight loss and overall health benefits. We discussed the following Behavioral Modification Strategies today: increase H2O intake, no skipping meals, keeping healthy foods in the home, increasing lean protein intake, decreasing simple carbohydrates,  increasing vegetables, decrease eating out and work on meal planning and easy cooking plans [name] will weigh herself at home. She will be more strict with her diet.  [name] has agreed to follow up with our clinic in 2 weeks. She was informed of the importance of frequent follow up visits to maximize her success with intensive lifestyle modifications for her multiple health conditions.  ALLERGIES: Allergies  Allergen Reactions  . No Known Allergies     MEDICATIONS: Current Outpatient Medications on File Prior to Visit  Medication Sig Dispense Refill  . acetaminophen (TYLENOL) 500 MG tablet Take 1,000 mg by mouth 2 (two) times daily as needed for moderate pain or headache.    . albuterol (PROVENTIL HFA;VENTOLIN HFA) 108 (90 Base) MCG/ACT inhaler Inhale 2 puffs into the lungs every 6 (six) hours as needed for wheezing or shortness of breath. 1 Inhaler 12  . anastrozole (ARIMIDEX) 1 MG tablet TAKE 1 TABLET (1 MG TOTAL) BY MOUTH DAILY. 90 tablet 3  . aspirin EC 81 MG tablet Take 81 mg by mouth at bedtime.     Marland Kitchen BREO ELLIPTA 100-25 MCG/INH AEPB INHALE 1 PUFF INTO LUNGS ONCE A DAY 60 each 0  . BYSTOLIC 10 MG tablet TAKE 1 TABLET (10 MG TOTAL) BY MOUTH AT BEDTIME. 90 tablet 2  . diclofenac (VOLTAREN) 75 MG EC tablet Take 1 tablet (75 mg total) by mouth 2 (two) times daily. 180 tablet 1  . diltiazem (CARDIZEM CD) 120 MG 24 hr capsule TAKE 1 CAPSULE (120 MG TOTAL) BY MOUTH DAILY. 90 capsule 2  . escitalopram (LEXAPRO) 20 MG tablet Take 1 tablet (20 mg total) by mouth daily. 90 tablet 1  . esomeprazole (NEXIUM) 40 MG capsule TAKE 1 CAPSULE EVERY DAY 90 capsule 2  . ezetimibe (ZETIA) 10 MG tablet TAKE 1 TABLET EVERY DAY 90 tablet 3  . fluticasone (FLONASE) 50 MCG/ACT nasal spray Place 2 sprays into both nostrils daily. 16 g 6  . furosemide (LASIX) 20 MG tablet TAKE 1 TABLET (20 MG TOTAL) BY MOUTH DAILY. 90 tablet 2  . glucose blood (ACCU-CHEK AVIVA) test strip Twice daily (please use brand that  patient insurance covers) 100 each 5  . Hypromellose (ARTIFICIAL TEARS OP) Place 1 drop into both eyes daily as needed (dry eyes).     Marland Kitchen loratadine (CLARITIN) 10 MG tablet Take 1 tablet (10 mg total) by mouth daily. 30 tablet 11  . losartan (COZAAR) 100 MG tablet TAKE 1 TABLET EVERY DAY 90 tablet 2  . metFORMIN (GLUCOPHAGE) 500 MG tablet Take 1 tablet (500 mg total) by mouth 2 (two) times daily with a meal. 180 tablet 0  . montelukast (SINGULAIR) 10 MG tablet TAKE 1 TABLET (10 MG TOTAL) BY MOUTH AT BEDTIME. 90 tablet 2  . Probiotic Product (PROBIOTIC PO) Take 1 capsule by mouth at bedtime.    . ranitidine (ZANTAC) 150 MG tablet TAKE 1 TABLET (150 MG TOTAL) BY MOUTH AT BEDTIME. 90 tablet 1  . rosuvastatin (CRESTOR) 40 MG tablet TAKE 1 TABLET EVERY DAY 90 tablet 3  . Semaglutide,0.25 or 0.5MG /DOS, (OZEMPIC, 0.25 OR 0.5 MG/DOSE,) 2 MG/1.5ML SOPN Inject 0.5 mg into the skin once a week. 4 pen 0  . Vitamin D, Ergocalciferol, (DRISDOL) 1.25  MG (50000 UT) CAPS capsule Take 1 capsule (50,000 Units total) by mouth every 7 (seven) days. 2 capsule 0   No current facility-administered medications on file prior to visit.     PAST MEDICAL HISTORY: Past Medical History:  Diagnosis Date  . Anemia 04/05/2012  . Anxiety   . Anxiety state 09/11/2007   Qualifier: Diagnosis of  By: Scherrie Gerlach    . Asthma   . Atrial tachycardia, paroxysmal ([place])   . Back pain 10/02/2014  . Breast cancer ([place]) 02/17/2017  . Chicken pox as a child  . Chronic diastolic CHF (congestive heart failure), NYHA class 1 ([place])   . Complication of anesthesia    pt states feels different in her body after anesthesia when waking up and also experiences a smell of burnt plastic for approx a wk   . Constipation   . Depression   . Diabetes ([place])   . Dilated aortic root ([place])    58mm by echo 09/2016  . Dysuria 02/17/2017  . Elevated LFTs 04/05/2012  . GERD (gastroesophageal reflux disease)   . Goiter   . Heart murmur    hx of one at  birth   . History of kidney stones   . History of radiation therapy 10/31/16-12/17/16   left breast 45 Gy in 25 fractions, left breast boost 16 Gy in 8 fractions  . Hx of colonic polyps   . Hyperlipidemia   . Hypertension   . Insomnia 10/08/2016  . Joint pain   . Malignant neoplasm of upper-outer quadrant of left female breast ([place]) 05/14/2016   not with patient  . Measles as a child  . Mumps as a child  . OA (osteoarthritis) of knee 04/05/2012  . Obesity 07/11/2014  . PONV (postoperative nausea and vomiting)   . Preventative health care 09/05/2013  . PVC's (premature ventricular contractions) 05/02/2016  . Shortness of breath dyspnea    walking distances / climbing stairs  . Sleep apnea 04/05/2012  . Tinea corporis 02/23/2013  . Type 2 diabetes mellitus with hyperglycemia ([place]) 12/31/2013  . Vasomotor rhinitis 04/05/2012  . Ventral hernia   . Wheezing     PAST SURGICAL HISTORY: Past Surgical History:  Procedure Laterality Date  . BREAST LUMPECTOMY Left 2017  . BREAST LUMPECTOMY WITH RADIOACTIVE SEED AND SENTINEL LYMPH NODE BIOPSY Left 06/21/2016   Procedure: LEFT BREAST LUMPECTOMY WITH RADIOACTIVE SEED AND SENTINEL LYMPH NODE BIOPSY, INJECT BLUE DYE LEFT BREAST;  Surgeon: Fanny Skates, MD;  Location: [facility];  Service: General;  Laterality: Left;  . CARDIAC CATHETERIZATION     normal coroary arteries per patient  . CARDIOVASCULAR STRESS TEST     10/12/2013  . CESAREAN SECTION     X 3  . CHOLECYSTECTOMY    . COLONOSCOPY WITH PROPOFOL N/A 03/28/2015   Procedure: COLONOSCOPY WITH PROPOFOL;  Surgeon: Juanita Craver, MD;  Location: [facility];  Service: Endoscopy;  Laterality: N/A;  . HERNIA REPAIR  02/08/11   ventral hernia  . JOINT REPLACEMENT     bilateral  . KNEE ARTHROSCOPY  05/2010   bilateral  . LEFT AND RIGHT HEART CATHETERIZATION WITH CORONARY ANGIOGRAM N/A 11/08/2013   Procedure: LEFT AND RIGHT HEART CATHETERIZATION WITH CORONARY ANGIOGRAM;  Surgeon: [name],  MD;  Location: [facility] CATH LAB;  Service: Cardiovascular;  Laterality: N/A;  . MENISCUS REPAIR  2009  . MOUTH SURGERY     teeth implants  . PORT-A-CATH REMOVAL N/A 01/24/2017   Procedure: REMOVAL PORT-A-CATH;  Surgeon:  Fanny Skates, MD;  Location: [facility];  Service: General;  Laterality: N/A;  . PORTACATH PLACEMENT Right 07/16/2016   Procedure: INSERTION PORT-A-CATH RIGHT INTERNAL JUGULAR WITH ULTRASOUND;  Surgeon: Fanny Skates, MD;  Location: [facility];  Service: General;  Laterality: Right;  . TOTAL KNEE ARTHROPLASTY  2011   left  . TOTAL KNEE ARTHROPLASTY Right 05/10/2014   Procedure: RIGHT TOTAL KNEE ARTHROPLASTY;  Surgeon: Mauri Pole, MD;  Location: [facility];  Service: Orthopedics;  Laterality: Right;  . TUBAL LIGATION    . WISDOM TOOTH EXTRACTION  2000    SOCIAL HISTORY: Social History   Tobacco Use  . Smoking status: Never Smoker  . Smokeless tobacco: Never Used  Substance Use Topics  . Alcohol use: Not Currently    Comment: rarely  . Drug use: No    FAMILY HISTORY: Family History  Problem Relation Age of Onset  . Thyroid disease Mother   . Hyperlipidemia Mother   . Depression Mother   . Anxiety disorder Mother   . Heart attack Father 77  . Hypertension Father   . Arthritis Father        RA  . Coronary artery disease Father   . High Cholesterol Father   . Coronary artery disease Brother   . Heart disease Brother   . Cancer Maternal Aunt        colon  . Cancer Maternal Grandmother        colon  . Heart attack Maternal Grandfather   . Alcohol abuse Paternal Grandfather     ROS: Review of Systems  Constitutional: Negative for weight loss.  Gastrointestinal: Positive for constipation. Negative for melena, nausea and vomiting.       Negative for hematochezia  Musculoskeletal:       Negative for muscle weakness    PHYSICAL EXAM: Pt in no acute distress  RECENT LABS AND TESTS: BMET    Component Value Date/Time   NA 142 07/02/2018 1230   NA 145 04/11/2017  0942   K 5.0 07/02/2018 1230   K 4.4 04/11/2017 0942   CL 100 07/02/2018 1230   CO2 22 07/02/2018 1230   CO2 26 04/11/2017 0942   GLUCOSE 118 (H) 07/02/2018 1230   GLUCOSE 221 (H) 12/22/2017 0907   GLUCOSE 156 (H) 04/11/2017 0942   BUN 24 07/02/2018 1230   BUN 15.1 04/11/2017 0942   CREATININE 0.80 07/02/2018 1230   CREATININE 0.87 10/06/2017 1010   CREATININE 0.8 04/11/2017 0942   CALCIUM 10.3 07/02/2018 1230   CALCIUM 10.0 04/11/2017 0942   GFRNONAA 77 07/02/2018 1230   GFRNONAA >60 10/06/2017 1010   GFRAA 88 07/02/2018 1230   GFRAA >60 10/06/2017 1010   Lab Results  Component Value Date   HGBA1C 6.3 (H) 07/02/2018   HGBA1C 9.5 (H) 03/26/2018   HGBA1C 8.7 (H) 12/22/2017   HGBA1C 7.5 (H) 09/22/2017   HGBA1C 7.2 (H) 05/22/2017   Lab Results  Component Value Date   INSULIN 59.3 (H) 07/02/2018   INSULIN 57.0 (H) 03/26/2018   CBC    Component Value Date/Time   WBC 9.0 03/26/2018 1024   WBC 7.5 12/22/2017 0907   RBC 4.95 03/26/2018 1024   RBC 4.87 12/22/2017 0907   HGB 12.4 03/26/2018 1024   HGB 12.2 04/11/2017 0942   HCT 40.3 03/26/2018 1024   HCT 39.1 04/11/2017 0942   PLT 282.0 12/22/2017 0907   PLT 274 10/06/2017 1010   PLT 240 04/11/2017 0942   MCV 81 03/26/2018 1024  MCV 81.6 04/11/2017 0942   MCH 25.1 (L) 03/26/2018 1024   MCH 25.6 10/06/2017 1010   MCHC 30.8 (L) 03/26/2018 1024   MCHC 31.1 12/22/2017 0907   RDW 17.1 (H) 03/26/2018 1024   RDW 19.2 (H) 04/11/2017 0942   LYMPHSABS 1.5 03/26/2018 1024   LYMPHSABS 1.4 04/11/2017 0942   MONOABS 0.5 12/22/2017 0907   MONOABS 0.3 04/11/2017 0942   EOSABS 0.0 03/26/2018 1024   BASOSABS 0.0 03/26/2018 1024   BASOSABS 0.0 04/11/2017 0942   Iron/TIBC/Ferritin/ %Sat No results found for: IRON, TIBC, FERRITIN, IRONPCTSAT Lipid Panel     Component Value Date/Time   CHOL 126 03/26/2018 1024   TRIG 188 (H) 03/26/2018 1024   HDL 34 (L) 03/26/2018 1024   CHOLHDL 3 12/22/2017 0907   VLDL 33.2 12/22/2017 0907    LDLCALC 54 03/26/2018 1024   LDLDIRECT 108.0 10/08/2016 0849   Hepatic Function Panel     Component Value Date/Time   PROT 7.1 07/02/2018 1230   PROT 7.5 04/11/2017 0942   ALBUMIN 4.1 07/02/2018 1230   ALBUMIN 3.6 04/11/2017 0942   AST 19 07/02/2018 1230   AST 33 10/06/2017 1010   AST 41 (H) 04/11/2017 0942   ALT 24 07/02/2018 1230   ALT 30 10/06/2017 1010   ALT 40 04/11/2017 0942   ALKPHOS 136 (H) 07/02/2018 1230   ALKPHOS 93 04/11/2017 0942   BILITOT 0.3 07/02/2018 1230   BILITOT 0.5 10/06/2017 1010   BILITOT 0.48 04/11/2017 0942   BILIDIR 0.15 06/19/2017 0950   IBILI 0.3 05/24/2016 0828      Component Value Date/Time   TSH 2.130 03/26/2018 1024   TSH 1.87 12/22/2017 0907   TSH 0.81 09/22/2017 0850     Ref. Range 07/02/2018 12:30  Vitamin D, 25-Hydroxy Latest Ref Range: 30.0 - 100.0 ng/mL 31.4    I, Doreene Nest, am acting as Location manager for [employer]. Owens Shark, DO  I have reviewed the above documentation for accuracy and completeness, and I agree with the above. -Jearld Lesch, DO

## 2018-12-01 ENCOUNTER — Other Ambulatory Visit (INDEPENDENT_AMBULATORY_CARE_PROVIDER_SITE_OTHER): Payer: Self-pay | Admitting: Family Medicine

## 2018-12-01 DIAGNOSIS — E119 Type 2 diabetes mellitus without complications: Secondary | ICD-10-CM

## 2018-12-03 ENCOUNTER — Other Ambulatory Visit (INDEPENDENT_AMBULATORY_CARE_PROVIDER_SITE_OTHER): Payer: Self-pay

## 2018-12-03 DIAGNOSIS — E119 Type 2 diabetes mellitus without complications: Secondary | ICD-10-CM

## 2018-12-03 MED ORDER — OZEMPIC (0.25 OR 0.5 MG/DOSE) 2 MG/1.5ML ~~LOC~~ SOPN: 0.5000 mg | PEN_INJECTOR | SUBCUTANEOUS | 0 refills | Status: DC

## 2018-12-04 ENCOUNTER — Other Ambulatory Visit: Payer: Self-pay | Admitting: Family Medicine

## 2018-12-07 ENCOUNTER — Telehealth: Payer: Self-pay | Admitting: Internal Medicine

## 2018-12-07 DIAGNOSIS — G4733 Obstructive sleep apnea (adult) (pediatric): Secondary | ICD-10-CM

## 2018-12-07 NOTE — Telephone Encounter (Signed)
Returned call to patient.  Patient states CPAP is still working but giving her a message that motor life has exceeded limits.  Patient requesting order to replace CPAP.  She believes it is over 67 years old.  Advised patient we would route to Dr. Annamaria Boots for new order and notify her once order has been placed.   DME:  [facility]   Dr. Annamaria Boots please advise on new CPAP order for this patient. Order has been placed.   Thank you

## 2018-12-07 NOTE — Telephone Encounter (Signed)
Called & spoke w/ pt regarding these recommendations. Pt verbalized understanding and agreed to these measures.   Orders have been placed to [facility]. Nothing further needed at this time.

## 2018-12-07 NOTE — Telephone Encounter (Signed)
Ok to order DME [facility] please replace old CPAP machine, auto 5-15, mask of choice, humidifier, supplies, Air/view/ card

## 2018-12-08 ENCOUNTER — Ambulatory Visit (INDEPENDENT_AMBULATORY_CARE_PROVIDER_SITE_OTHER): Payer: Medicare HMO | Admitting: Bariatrics

## 2018-12-08 ENCOUNTER — Other Ambulatory Visit: Payer: Self-pay

## 2018-12-08 ENCOUNTER — Encounter (INDEPENDENT_AMBULATORY_CARE_PROVIDER_SITE_OTHER): Payer: Self-pay | Admitting: Bariatrics

## 2018-12-08 ENCOUNTER — Telehealth: Payer: Self-pay | Admitting: Internal Medicine

## 2018-12-08 DIAGNOSIS — G4733 Obstructive sleep apnea (adult) (pediatric): Secondary | ICD-10-CM

## 2018-12-08 DIAGNOSIS — E119 Type 2 diabetes mellitus without complications: Secondary | ICD-10-CM | POA: Diagnosis not present

## 2018-12-08 DIAGNOSIS — E559 Vitamin D deficiency, unspecified: Secondary | ICD-10-CM

## 2018-12-08 DIAGNOSIS — Z6841 Body Mass Index (BMI) 40.0 and over, adult: Secondary | ICD-10-CM

## 2018-12-08 NOTE — Telephone Encounter (Signed)
Called and spoke with [name], [facility].  [name] requested length of need for cpap order.  New cpap order placed with length of need, lifetime.  Nothing further at this time.

## 2018-12-09 IMAGING — DX DG CHEST 2V
2 series · 2 of 2 positions shown · non-contrast
Comparison: 07/16/2016

CLINICAL DATA: Short of breath and cough

EXAM:
CHEST  2 VIEW

[chest pa]
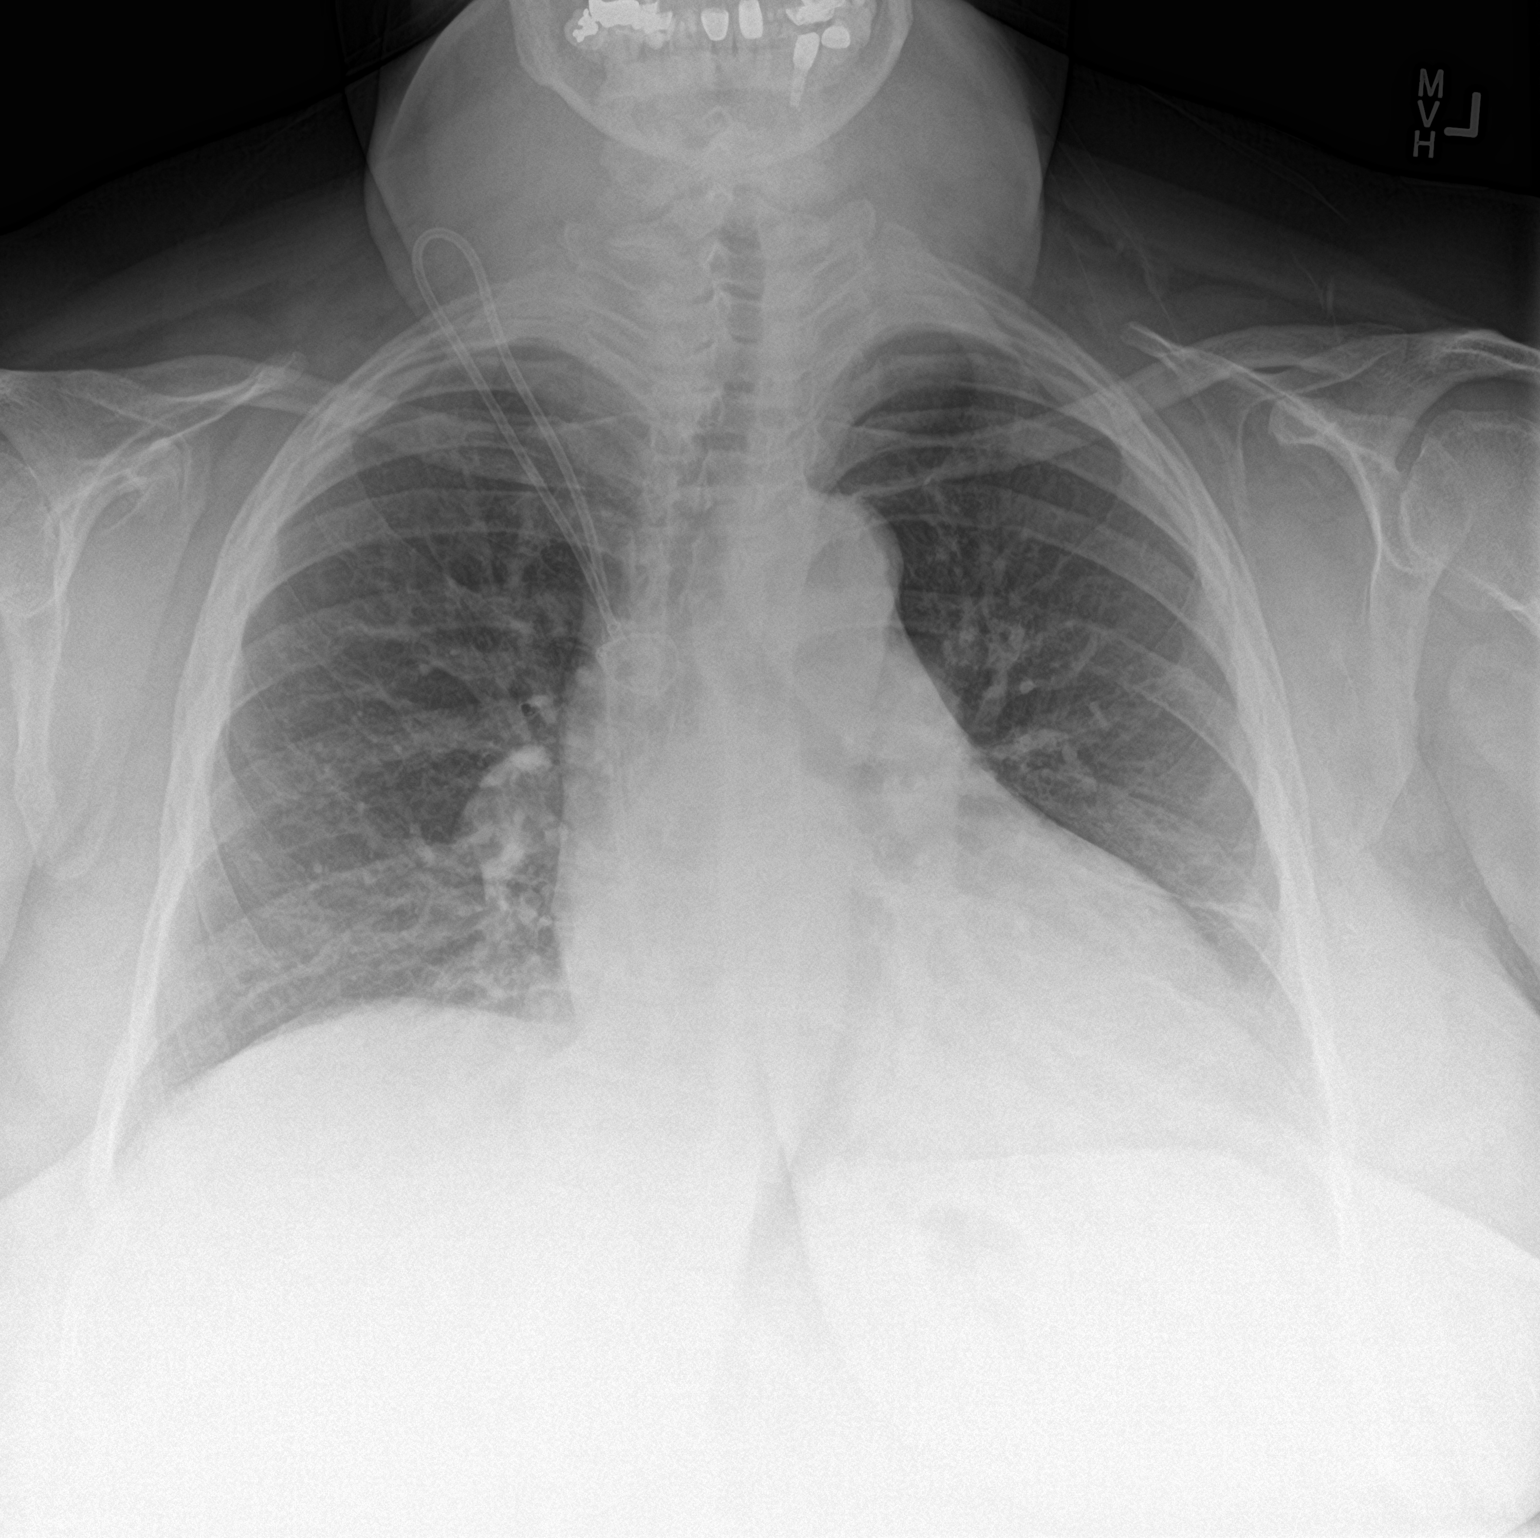

[chest lat]
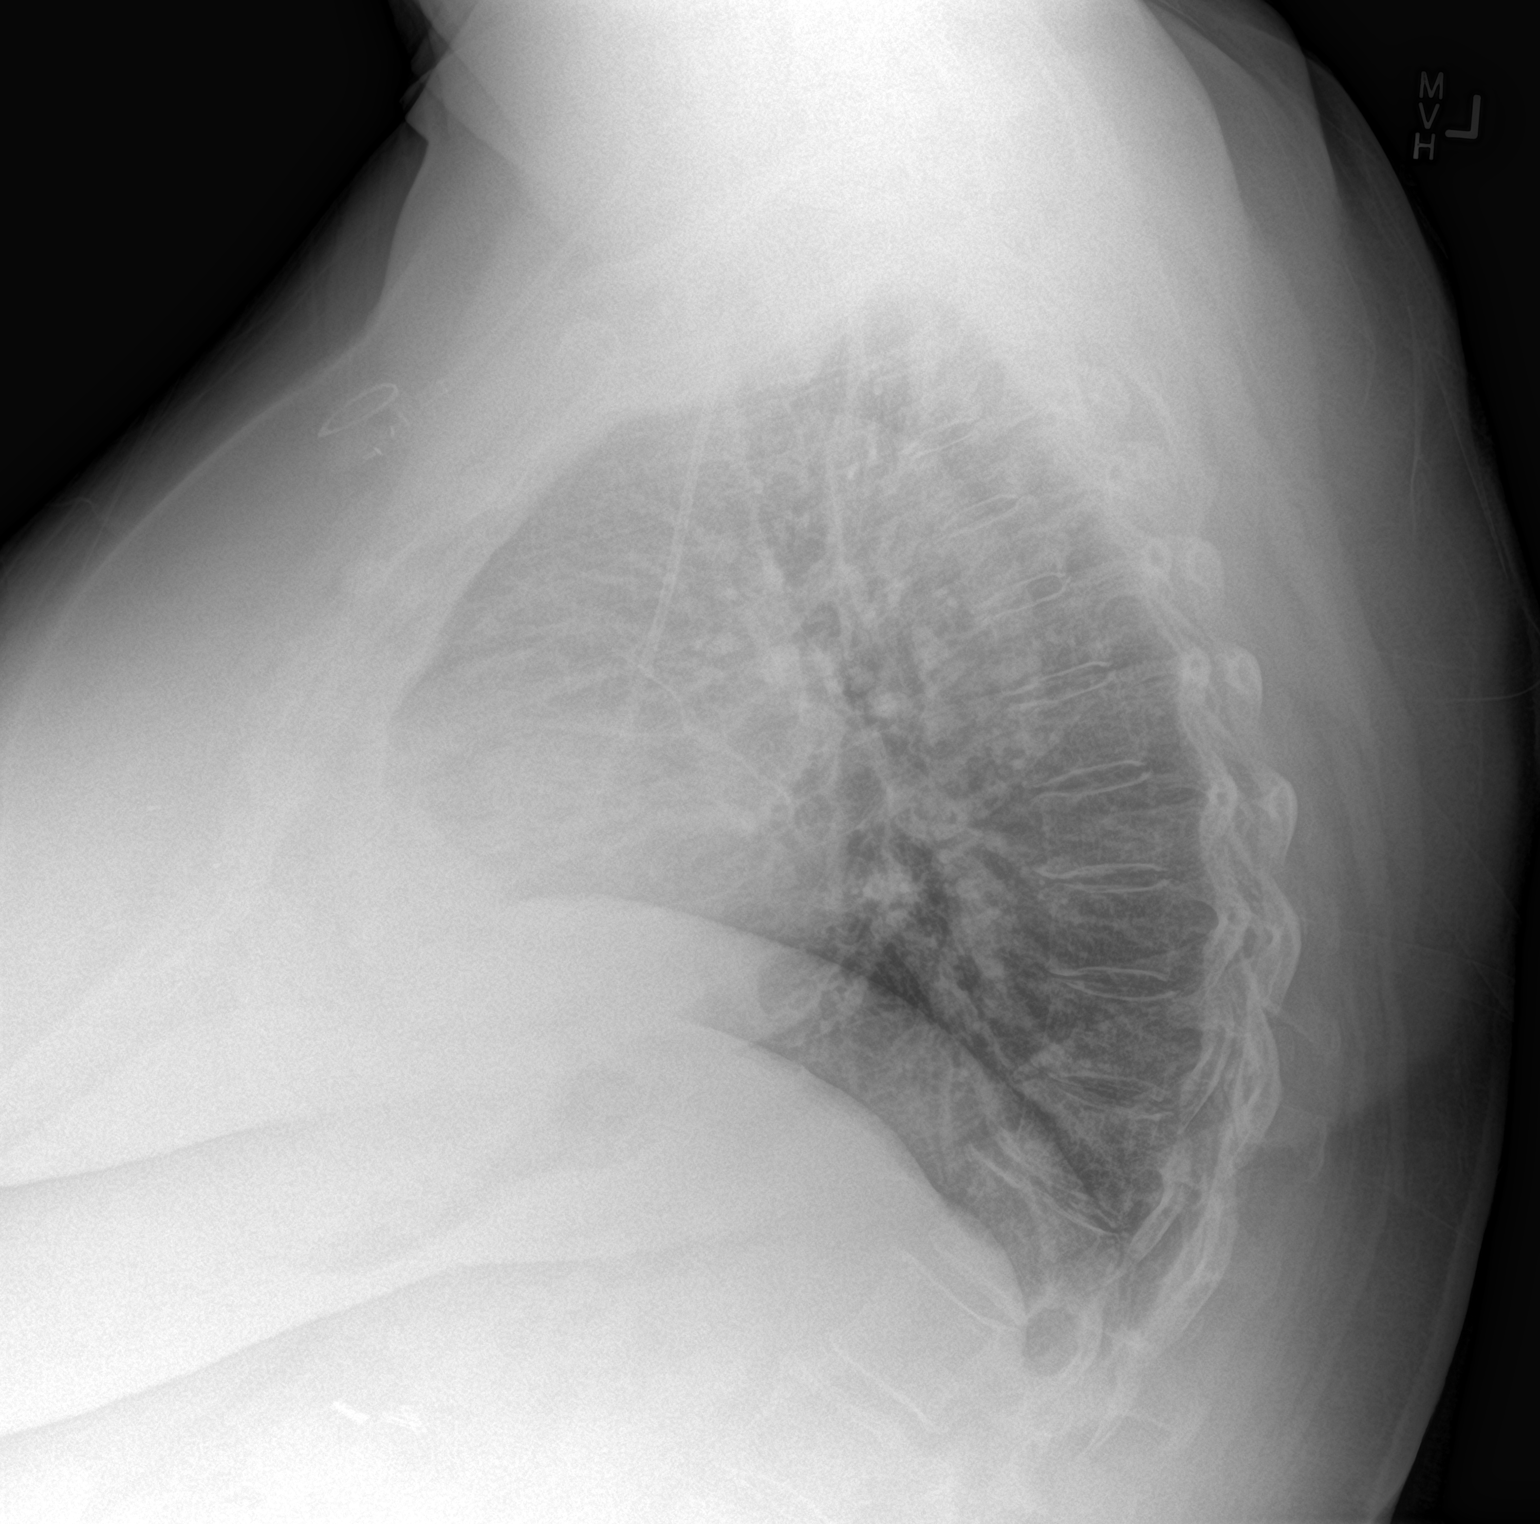

[2 of 2 positions shown; findings below may reference images not displayed]

FINDINGS: Cardiac enlargement without heart failure. Mild atelectasis the
bases. Negative for pneumonia or effusion. Port-A-Cath tip in the
mid right atrium unchanged
IMPRESSION: No active cardiopulmonary disease.

## 2018-12-09 NOTE — Progress Notes (Signed)
Office: [phone]  /  Fax: [phone] TeleHealth Visit:  [name] has verbally consented to this TeleHealth visit today. The patient is located at home, the provider is located at the [facility] office. The participants in this visit include the listed provider and patient and any and all parties involved. The visit was conducted today via FaceTime.  HPI:   Chief Complaint: OBESITY [name] is here to discuss her progress with her obesity treatment plan. She is on the Category 4 plan and is following her eating plan approximately 70 % of the time. She states she is exercising 0 minutes 0 times per week. Kamariyah states that she has remained the same (weight 270 lbs). She has done well overall. "She feels stuck". We were unable to weigh the patient today for this TeleHealth visit. She feels as if she has maintained weight since her last visit. She has lost 53 lbs since starting treatment with [name].  Diabetes II [name] has a diagnosis of diabetes type II. [name] is taking Glucophage and Ozempic. She denies any hypoglycemic episodes. Last A1c was at 6.3 She has been working on intensive lifestyle modifications including diet, exercise, and weight loss to help control her blood glucose levels.  Vitamin D deficiency [name] has a diagnosis of vitamin D deficiency. [name] is taking high dose vit D and she denies nausea, vomiting or muscle weakness.  ASSESSMENT AND PLAN:  Type 2 diabetes mellitus without complication, without long-term current use of insulin (HCC)  Vitamin D deficiency  Class 3 severe obesity with serious comorbidity and body mass index (BMI) of 45.0 to 49.9 in adult, unspecified obesity type ([place])  PLAN:  Diabetes II [name] has been given extensive diabetes education by myself today including ideal fasting and post-prandial blood glucose readings, individual ideal Hgb A1c goals and hypoglycemia prevention. We discussed the importance of good  blood sugar control to decrease the likelihood of diabetic complications such as nephropathy, neuropathy, limb loss, blindness, coronary artery disease, and death. We discussed the importance of intensive lifestyle modification including diet, exercise and weight loss as the first line treatment for diabetes. Denzil agrees to continue her diabetes medications and will follow up at the agreed upon time.  Vitamin D Deficiency [name] was informed that low vitamin D levels contributes to fatigue and are associated with obesity, breast, and colon cancer. She agrees to continue to take prescription Vit D @50 ,000 IU every week and will follow up for routine testing of vitamin D, at least 2-3 times per year. She was informed of the risk of over-replacement of vitamin D and agrees to not increase her dose unless she discusses this with [name] first.  Obesity [name] is currently in the action stage of change. As such, her goal is to continue with weight loss efforts She has agreed to follow the Category [place] has been instructed to work up to a goal of 150 minutes of combined cardio and strengthening exercise per week for weight loss and overall health benefits. We discussed the following Behavioral Modification Strategies today: increase H2O intake, no skipping meals, keeping healthy foods in the home, increasing lean protein intake, decreasing simple carbohydrates , increasing vegetables, decrease eating out and work on meal planning and intentional eating [name] will continue to weigh herself at home. She will get on the plan.  [name] has agreed to follow up with our clinic in 2 weeks. She was informed of the importance of frequent follow up visits to maximize  her success with intensive lifestyle modifications for her multiple health conditions.  ALLERGIES: Allergies  Allergen Reactions  . No Known Allergies     MEDICATIONS: Current Outpatient Medications on File Prior to Visit  Medication  Sig Dispense Refill  . acetaminophen (TYLENOL) 500 MG tablet Take 1,000 mg by mouth 2 (two) times daily as needed for moderate pain or headache.    . albuterol (PROVENTIL HFA;VENTOLIN HFA) 108 (90 Base) MCG/ACT inhaler Inhale 2 puffs into the lungs every 6 (six) hours as needed for wheezing or shortness of breath. 1 Inhaler 12  . anastrozole (ARIMIDEX) 1 MG tablet TAKE 1 TABLET (1 MG TOTAL) BY MOUTH DAILY. 90 tablet 3  . aspirin EC 81 MG tablet Take 81 mg by mouth at bedtime.     Marland Kitchen BREO ELLIPTA 100-25 MCG/INH AEPB INHALE 1 PUFF INTO LUNGS ONCE A DAY 60 each 0  . BYSTOLIC 10 MG tablet TAKE 1 TABLET (10 MG TOTAL) BY MOUTH AT BEDTIME. 90 tablet 2  . diclofenac (VOLTAREN) 75 MG EC tablet Take 1 tablet (75 mg total) by mouth 2 (two) times daily. 180 tablet 1  . diltiazem (CARDIZEM CD) 120 MG 24 hr capsule TAKE 1 CAPSULE (120 MG TOTAL) BY MOUTH DAILY. 90 capsule 2  . escitalopram (LEXAPRO) 20 MG tablet Take 1 tablet (20 mg total) by mouth daily. 90 tablet 1  . esomeprazole (NEXIUM) 40 MG capsule TAKE 1 CAPSULE EVERY DAY 90 capsule 2  . ezetimibe (ZETIA) 10 MG tablet TAKE 1 TABLET EVERY DAY 90 tablet 3  . fluticasone (FLONASE) 50 MCG/ACT nasal spray Place 2 sprays into both nostrils daily. 16 g 6  . furosemide (LASIX) 20 MG tablet TAKE 1 TABLET (20 MG TOTAL) BY MOUTH DAILY. 90 tablet 2  . glucose blood (ACCU-CHEK AVIVA) test strip Twice daily (please use brand that patient insurance covers) 100 each 5  . Hypromellose (ARTIFICIAL TEARS OP) Place 1 drop into both eyes daily as needed (dry eyes).     Marland Kitchen loratadine (CLARITIN) 10 MG tablet Take 1 tablet (10 mg total) by mouth daily. 30 tablet 11  . losartan (COZAAR) 100 MG tablet TAKE 1 TABLET EVERY DAY 90 tablet 2  . metFORMIN (GLUCOPHAGE) 500 MG tablet TAKE 1 TABLET TWICE DAILY WITH A MEAL 180 tablet 0  . montelukast (SINGULAIR) 10 MG tablet TAKE 1 TABLET (10 MG TOTAL) BY MOUTH AT BEDTIME. 90 tablet 2  . Probiotic Product (PROBIOTIC PO) Take 1 capsule by  mouth at bedtime.    . ranitidine (ZANTAC) 150 MG tablet TAKE 1 TABLET (150 MG TOTAL) BY MOUTH AT BEDTIME. 90 tablet 1  . rosuvastatin (CRESTOR) 40 MG tablet TAKE 1 TABLET EVERY DAY 90 tablet 3  . Semaglutide,0.25 or 0.5MG /DOS, (OZEMPIC, 0.25 OR 0.5 MG/DOSE,) 2 MG/1.5ML SOPN Inject 0.5 mg into the skin once a week. 4 pen 0  . Vitamin D, Ergocalciferol, (DRISDOL) 1.25 MG (50000 UT) CAPS capsule Take 1 capsule (50,000 Units total) by mouth every 7 (seven) days. 2 capsule 0   No current facility-administered medications on file prior to visit.     PAST MEDICAL HISTORY: Past Medical History:  Diagnosis Date  . Anemia 04/05/2012  . Anxiety   . Anxiety state 09/11/2007   Qualifier: Diagnosis of  By: Scherrie Gerlach    . Asthma   . Atrial tachycardia, paroxysmal ([place])   . Back pain 10/02/2014  . Breast cancer ([place]) 02/17/2017  . Chicken pox as a child  . Chronic diastolic CHF (  congestive heart failure), NYHA class 1 ([place])   . Complication of anesthesia    pt states feels different in her body after anesthesia when waking up and also experiences a smell of burnt plastic for approx a wk   . Constipation   . Depression   . Diabetes ([place])   . Dilated aortic root ([place])    13mm by echo 09/2016  . Dysuria 02/17/2017  . Elevated LFTs 04/05/2012  . GERD (gastroesophageal reflux disease)   . Goiter   . Heart murmur    hx of one at birth   . History of kidney stones   . History of radiation therapy 10/31/16-12/17/16   left breast 45 Gy in 25 fractions, left breast boost 16 Gy in 8 fractions  . Hx of colonic polyps   . Hyperlipidemia   . Hypertension   . Insomnia 10/08/2016  . Joint pain   . Malignant neoplasm of upper-outer quadrant of left female breast ([place]) 05/14/2016   not with patient  . Measles as a child  . Mumps as a child  . OA (osteoarthritis) of knee 04/05/2012  . Obesity 07/11/2014  . PONV (postoperative nausea and vomiting)   . Preventative health care 09/05/2013  . PVC's  (premature ventricular contractions) 05/02/2016  . Shortness of breath dyspnea    walking distances / climbing stairs  . Sleep apnea 04/05/2012  . Tinea corporis 02/23/2013  . Type 2 diabetes mellitus with hyperglycemia ([place]) 12/31/2013  . Vasomotor rhinitis 04/05/2012  . Ventral hernia   . Wheezing     PAST SURGICAL HISTORY: Past Surgical History:  Procedure Laterality Date  . BREAST LUMPECTOMY Left 2017  . BREAST LUMPECTOMY WITH RADIOACTIVE SEED AND SENTINEL LYMPH NODE BIOPSY Left 06/21/2016   Procedure: LEFT BREAST LUMPECTOMY WITH RADIOACTIVE SEED AND SENTINEL LYMPH NODE BIOPSY, INJECT BLUE DYE LEFT BREAST;  Surgeon: Fanny Skates, MD;  Location: [facility];  Service: General;  Laterality: Left;  . CARDIAC CATHETERIZATION     normal coroary arteries per patient  . CARDIOVASCULAR STRESS TEST     10/12/2013  . CESAREAN SECTION     X 3  . CHOLECYSTECTOMY    . COLONOSCOPY WITH PROPOFOL N/A 03/28/2015   Procedure: COLONOSCOPY WITH PROPOFOL;  Surgeon: Juanita Craver, MD;  Location: [facility];  Service: Endoscopy;  Laterality: N/A;  . HERNIA REPAIR  02/08/11   ventral hernia  . JOINT REPLACEMENT     bilateral  . KNEE ARTHROSCOPY  05/2010   bilateral  . LEFT AND RIGHT HEART CATHETERIZATION WITH CORONARY ANGIOGRAM N/A 11/08/2013   Procedure: LEFT AND RIGHT HEART CATHETERIZATION WITH CORONARY ANGIOGRAM;  Surgeon: [name], MD;  Location: [facility] CATH LAB;  Service: Cardiovascular;  Laterality: N/A;  . MENISCUS REPAIR  2009  . MOUTH SURGERY     teeth implants  . PORT-A-CATH REMOVAL N/A 01/24/2017   Procedure: REMOVAL PORT-A-CATH;  Surgeon: Fanny Skates, MD;  Location: [place];  Service: General;  Laterality: N/A;  . PORTACATH PLACEMENT Right 07/16/2016   Procedure: INSERTION PORT-A-CATH RIGHT INTERNAL JUGULAR WITH ULTRASOUND;  Surgeon: Fanny Skates, MD;  Location: [facility];  Service: General;  Laterality: Right;  . TOTAL KNEE ARTHROPLASTY  2011   left  . TOTAL KNEE ARTHROPLASTY Right  05/10/2014   Procedure: RIGHT TOTAL KNEE ARTHROPLASTY;  Surgeon: Mauri Pole, MD;  Location: [facility];  Service: Orthopedics;  Laterality: Right;  . TUBAL LIGATION    . WISDOM TOOTH EXTRACTION  2000    SOCIAL HISTORY: Social  History   Tobacco Use  . Smoking status: Never Smoker  . Smokeless tobacco: Never Used  Substance Use Topics  . Alcohol use: Not Currently    Comment: rarely  . Drug use: No    FAMILY HISTORY: Family History  Problem Relation Age of Onset  . Thyroid disease Mother   . Hyperlipidemia Mother   . Depression Mother   . Anxiety disorder Mother   . Heart attack Father 79  . Hypertension Father   . Arthritis Father        RA  . Coronary artery disease Father   . High Cholesterol Father   . Coronary artery disease Brother   . Heart disease Brother   . Cancer Maternal Aunt        colon  . Cancer Maternal Grandmother        colon  . Heart attack Maternal Grandfather   . Alcohol abuse Paternal Grandfather     ROS: Review of Systems  Constitutional: Negative for weight loss.  Gastrointestinal: Negative for nausea and vomiting.  Musculoskeletal:       Negative for muscle weakness  Endo/Heme/Allergies:       Negative for hypoglycemia    PHYSICAL EXAM: Pt in no acute distress  RECENT LABS AND TESTS: BMET    Component Value Date/Time   NA 142 07/02/2018 1230   NA 145 04/11/2017 0942   K 5.0 07/02/2018 1230   K 4.4 04/11/2017 0942   CL 100 07/02/2018 1230   CO2 22 07/02/2018 1230   CO2 26 04/11/2017 0942   GLUCOSE 118 (H) 07/02/2018 1230   GLUCOSE 221 (H) 12/22/2017 0907   GLUCOSE 156 (H) 04/11/2017 0942   BUN 24 07/02/2018 1230   BUN 15.1 04/11/2017 0942   CREATININE 0.80 07/02/2018 1230   CREATININE 0.87 10/06/2017 1010   CREATININE 0.8 04/11/2017 0942   CALCIUM 10.3 07/02/2018 1230   CALCIUM 10.0 04/11/2017 0942   GFRNONAA 77 07/02/2018 1230   GFRNONAA >60 10/06/2017 1010   GFRAA 88 07/02/2018 1230   GFRAA >60 10/06/2017 1010    Lab Results  Component Value Date   HGBA1C 6.3 (H) 07/02/2018   HGBA1C 9.5 (H) 03/26/2018   HGBA1C 8.7 (H) 12/22/2017   HGBA1C 7.5 (H) 09/22/2017   HGBA1C 7.2 (H) 05/22/2017   Lab Results  Component Value Date   INSULIN 59.3 (H) 07/02/2018   INSULIN 57.0 (H) 03/26/2018   CBC    Component Value Date/Time   WBC 9.0 03/26/2018 1024   WBC 7.5 12/22/2017 0907   RBC 4.95 03/26/2018 1024   RBC 4.87 12/22/2017 0907   HGB 12.4 03/26/2018 1024   HGB 12.2 04/11/2017 0942   HCT 40.3 03/26/2018 1024   HCT 39.1 04/11/2017 0942   PLT 282.0 12/22/2017 0907   PLT 274 10/06/2017 1010   PLT 240 04/11/2017 0942   MCV 81 03/26/2018 1024   MCV 81.6 04/11/2017 0942   MCH 25.1 (L) 03/26/2018 1024   MCH 25.6 10/06/2017 1010   MCHC 30.8 (L) 03/26/2018 1024   MCHC 31.1 12/22/2017 0907   RDW 17.1 (H) 03/26/2018 1024   RDW 19.2 (H) 04/11/2017 0942   LYMPHSABS 1.5 03/26/2018 1024   LYMPHSABS 1.4 04/11/2017 0942   MONOABS 0.5 12/22/2017 0907   MONOABS 0.3 04/11/2017 0942   EOSABS 0.0 03/26/2018 1024   BASOSABS 0.0 03/26/2018 1024   BASOSABS 0.0 04/11/2017 0942   Iron/TIBC/Ferritin/ %Sat No results found for: IRON, TIBC, FERRITIN, IRONPCTSAT Lipid Panel  Component Value Date/Time   CHOL 126 03/26/2018 1024   TRIG 188 (H) 03/26/2018 1024   HDL 34 (L) 03/26/2018 1024   CHOLHDL 3 12/22/2017 0907   VLDL 33.2 12/22/2017 0907   LDLCALC 54 03/26/2018 1024   LDLDIRECT 108.0 10/08/2016 0849   Hepatic Function Panel     Component Value Date/Time   PROT 7.1 07/02/2018 1230   PROT 7.5 04/11/2017 0942   ALBUMIN 4.1 07/02/2018 1230   ALBUMIN 3.6 04/11/2017 0942   AST 19 07/02/2018 1230   AST 33 10/06/2017 1010   AST 41 (H) 04/11/2017 0942   ALT 24 07/02/2018 1230   ALT 30 10/06/2017 1010   ALT 40 04/11/2017 0942   ALKPHOS 136 (H) 07/02/2018 1230   ALKPHOS 93 04/11/2017 0942   BILITOT 0.3 07/02/2018 1230   BILITOT 0.5 10/06/2017 1010   BILITOT 0.48 04/11/2017 0942   BILIDIR 0.15  06/19/2017 0950   IBILI 0.3 05/24/2016 0828      Component Value Date/Time   TSH 2.130 03/26/2018 1024   TSH 1.87 12/22/2017 0907   TSH 0.81 09/22/2017 0850     Ref. Range 07/02/2018 12:30  Vitamin D, 25-Hydroxy Latest Ref Range: 30.0 - 100.0 ng/mL 31.4    I, Doreene Nest, am acting as Location manager for [employer]. Owens Shark, DO  I have reviewed the above documentation for accuracy and completeness, and I agree with the above. -Jearld Lesch, DO

## 2018-12-10 ENCOUNTER — Encounter (INDEPENDENT_AMBULATORY_CARE_PROVIDER_SITE_OTHER): Payer: Self-pay | Admitting: Bariatrics

## 2018-12-21 ENCOUNTER — Encounter (INDEPENDENT_AMBULATORY_CARE_PROVIDER_SITE_OTHER): Payer: Self-pay | Admitting: Bariatrics

## 2018-12-21 ENCOUNTER — Other Ambulatory Visit: Payer: Self-pay

## 2018-12-21 ENCOUNTER — Ambulatory Visit (INDEPENDENT_AMBULATORY_CARE_PROVIDER_SITE_OTHER): Payer: Medicare HMO | Admitting: Bariatrics

## 2018-12-21 VITALS — BP 142/84 | HR 72 | Temp 98.5°F | Ht 65.0 in | Wt 267.0 lb

## 2018-12-21 DIAGNOSIS — R7303 Prediabetes: Secondary | ICD-10-CM | POA: Diagnosis not present

## 2018-12-21 DIAGNOSIS — Z6841 Body Mass Index (BMI) 40.0 and over, adult: Secondary | ICD-10-CM | POA: Diagnosis not present

## 2018-12-21 DIAGNOSIS — E559 Vitamin D deficiency, unspecified: Secondary | ICD-10-CM | POA: Diagnosis not present

## 2018-12-21 NOTE — Progress Notes (Signed)
It d d

## 2018-12-23 NOTE — Progress Notes (Signed)
Office: [phone]  /  Fax: [phone]   HPI:   Chief Complaint: OBESITY [name] is here to discuss her progress with her obesity treatment plan. She is on the Category 4 plan and is following her eating plan approximately 60 % of the time. She states she is walking 30 minutes 3 times per week. [name] is down 7 pounds and she has done well overall. Her weight is 267 lb (121.1 kg) today and has had a weight loss of 7 pounds since her last in-office visit. She has lost 56 lbs since starting treatment with [name].  Vitamin D deficiency [name] has a diagnosis of vitamin D deficiency. She is currently taking vit D and denies nausea, vomiting or muscle weakness.  Pre-Diabetes [name] has a diagnosis of prediabetes based on her elevated Hgb A1c and was informed this puts her at greater risk of developing diabetes. Her 2 hour post prandial blood sugars are around 100. Her last A1c was at 6.3 and last insulin level was at 59.3 [name] is taking metformin and semaglutide. She continues to work on diet and exercise to decrease risk of diabetes. She denies nausea or hypoglycemia.  ASSESSMENT AND PLAN:  Vitamin D deficiency  Prediabetes  Class 3 severe obesity with serious comorbidity and body mass index (BMI) of 40.0 to 44.9 in adult, unspecified obesity type ([name])  PLAN:  Vitamin D Deficiency [name] was informed that low vitamin D levels contributes to fatigue and are associated with obesity, breast, and colon cancer. [name] will stop prescription Vit D @50 ,000 IU every week and she will take OTC Vitamin D 2,000 IU daily. She will follow up for routine testing of vitamin D, at least 2-3 times per year. She was informed of the risk of over-replacement of vitamin D and agrees to not increase her dose unless she discusses this with [name] first. [name] agrees to follow up as directed.  Pre-Diabetes [name] will continue to work on weight loss, exercise, and decreasing simple carbohydrates in her  diet to help decrease the risk of diabetes. She was informed that eating too many simple carbohydrates or too many calories at one sitting increases the likelihood of GI side effects. [name] will continue her diabetes medications as prescribed and follow up with [name] as directed to monitor her progress.  Obesity [name] is currently in the action stage of change. As such, her goal is to continue with weight loss efforts She has agreed to follow the Category [place] has been instructed to work up to a goal of 150 minutes of combined cardio and strengthening exercise per week for weight loss and overall health benefits. We discussed the following Behavioral Modification Strategies today: increase H2O intake, no skipping meals, keeping healthy foods in the home, increasing lean protein intake, decreasing simple carbohydrates, increasing vegetables, decrease eating out and work on meal planning and intentional eating  [name] has agreed to follow up with our clinic in 3 weeks. She was informed of the importance of frequent follow up visits to maximize her success with intensive lifestyle modifications for her multiple health conditions.  ALLERGIES: Allergies  Allergen Reactions   No Known Allergies     MEDICATIONS: Current Outpatient Medications on File Prior to Visit  Medication Sig Dispense Refill   acetaminophen (TYLENOL) 500 MG tablet Take 1,000 mg by mouth 2 (two) times daily as needed for moderate pain or headache.     albuterol (PROVENTIL HFA;VENTOLIN HFA) 108 (90 Base) MCG/ACT inhaler Inhale 2 puffs into the  lungs every 6 (six) hours as needed for wheezing or shortness of breath. 1 Inhaler 12   anastrozole (ARIMIDEX) 1 MG tablet TAKE 1 TABLET (1 MG TOTAL) BY MOUTH DAILY. 90 tablet 3   aspirin EC 81 MG tablet Take 81 mg by mouth at bedtime.      BREO ELLIPTA 100-25 MCG/INH AEPB INHALE 1 PUFF INTO LUNGS ONCE A DAY 60 each 0   BYSTOLIC 10 MG tablet TAKE 1 TABLET (10 MG TOTAL)  BY MOUTH AT BEDTIME. 90 tablet 2   diclofenac (VOLTAREN) 75 MG EC tablet Take 1 tablet (75 mg total) by mouth 2 (two) times daily. 180 tablet 1   diltiazem (CARDIZEM CD) 120 MG 24 hr capsule TAKE 1 CAPSULE (120 MG TOTAL) BY MOUTH DAILY. 90 capsule 2   escitalopram (LEXAPRO) 20 MG tablet Take 1 tablet (20 mg total) by mouth daily. 90 tablet 1   esomeprazole (NEXIUM) 40 MG capsule TAKE 1 CAPSULE EVERY DAY 90 capsule 2   ezetimibe (ZETIA) 10 MG tablet TAKE 1 TABLET EVERY DAY 90 tablet 3   fluticasone (FLONASE) 50 MCG/ACT nasal spray Place 2 sprays into both nostrils daily. 16 g 6   furosemide (LASIX) 20 MG tablet TAKE 1 TABLET (20 MG TOTAL) BY MOUTH DAILY. 90 tablet 2   glucose blood (ACCU-CHEK AVIVA) test strip Twice daily (please use brand that patient insurance covers) 100 each 5   Hypromellose (ARTIFICIAL TEARS OP) Place 1 drop into both eyes daily as needed (dry eyes).      loratadine (CLARITIN) 10 MG tablet Take 1 tablet (10 mg total) by mouth daily. 30 tablet 11   losartan (COZAAR) 100 MG tablet TAKE 1 TABLET EVERY DAY 90 tablet 2   metFORMIN (GLUCOPHAGE) 500 MG tablet TAKE 1 TABLET TWICE DAILY WITH A MEAL 180 tablet 0   montelukast (SINGULAIR) 10 MG tablet TAKE 1 TABLET (10 MG TOTAL) BY MOUTH AT BEDTIME. 90 tablet 2   Probiotic Product (PROBIOTIC PO) Take 1 capsule by mouth at bedtime.     ranitidine (ZANTAC) 150 MG tablet TAKE 1 TABLET (150 MG TOTAL) BY MOUTH AT BEDTIME. 90 tablet 1   rosuvastatin (CRESTOR) 40 MG tablet TAKE 1 TABLET EVERY DAY 90 tablet 3   Semaglutide,0.25 or 0.5MG /DOS, (OZEMPIC, 0.25 OR 0.5 MG/DOSE,) 2 MG/1.5ML SOPN Inject 0.5 mg into the skin once a week. 4 pen 0   No current facility-administered medications on file prior to visit.     PAST MEDICAL HISTORY: Past Medical History:  Diagnosis Date   Anemia 04/05/2012   Anxiety    Anxiety state 09/11/2007   Qualifier: Diagnosis of  By: Sherren Mocha RN, Dorian Pod     Asthma    Atrial tachycardia,  paroxysmal ([place])    Back pain 10/02/2014   Breast cancer ([place]) 02/17/2017   Chicken pox as a child   Chronic diastolic CHF (congestive heart failure), NYHA class 1 (HCC)    Complication of anesthesia    pt states feels different in her body after anesthesia when waking up and also experiences a smell of burnt plastic for approx a wk    Constipation    Depression    Diabetes ([place])    Dilated aortic root ([place])    72mm by echo 09/2016   Dysuria 02/17/2017   Elevated LFTs 04/05/2012   GERD (gastroesophageal reflux disease)    Goiter    Heart murmur    hx of one at birth    History of kidney stones  History of radiation therapy 10/31/16-12/17/16   left breast 45 Gy in 25 fractions, left breast boost 16 Gy in 8 fractions   Hx of colonic polyps    Hyperlipidemia    Hypertension    Insomnia 10/08/2016   Joint pain    Malignant neoplasm of upper-outer quadrant of left female breast ([place]) 05/14/2016   not with patient   Measles as a child   Mumps as a child   OA (osteoarthritis) of knee 04/05/2012   Obesity 07/11/2014   PONV (postoperative nausea and vomiting)    Preventative health care 09/05/2013   PVC's (premature ventricular contractions) 05/02/2016   Shortness of breath dyspnea    walking distances / climbing stairs   Sleep apnea 04/05/2012   Tinea corporis 02/23/2013   Type 2 diabetes mellitus with hyperglycemia ([place]) 12/31/2013   Vasomotor rhinitis 04/05/2012   Ventral hernia    Wheezing     PAST SURGICAL HISTORY: Past Surgical History:  Procedure Laterality Date   BREAST LUMPECTOMY Left 2017   BREAST LUMPECTOMY WITH RADIOACTIVE SEED AND SENTINEL LYMPH NODE BIOPSY Left 06/21/2016   Procedure: LEFT BREAST LUMPECTOMY WITH RADIOACTIVE SEED AND SENTINEL LYMPH NODE BIOPSY, INJECT BLUE DYE LEFT BREAST;  Surgeon: Fanny Skates, MD;  Location: [facility];  Service: General;  Laterality: Left;   CARDIAC CATHETERIZATION     normal coroary arteries per  patient   CARDIOVASCULAR STRESS TEST     10/12/2013   CESAREAN SECTION     X 3   CHOLECYSTECTOMY     COLONOSCOPY WITH PROPOFOL N/A 03/28/2015   Procedure: COLONOSCOPY WITH PROPOFOL;  Surgeon: Juanita Craver, MD;  Location: [facility];  Service: Endoscopy;  Laterality: N/A;   HERNIA REPAIR  02/08/11   ventral hernia   JOINT REPLACEMENT     bilateral   KNEE ARTHROSCOPY  05/2010   bilateral   LEFT AND RIGHT HEART CATHETERIZATION WITH CORONARY ANGIOGRAM N/A 11/08/2013   Procedure: LEFT AND RIGHT HEART CATHETERIZATION WITH CORONARY ANGIOGRAM;  Surgeon: [name], MD;  Location: [facility] C[facility];  Service: Cardiovascular;  Laterality: N/A;   MENISCUS REPAIR  2009   MOUTH SURGERY     teeth implants   PORT-A-CATH REMOVAL N/A 01/24/2017   Procedure: REMOVAL PORT-A-CATH;  Surgeon: Fanny Skates, MD;  Location: [facility];  Service: General;  Laterality: N/A;   PORTACATH PLACEMENT Right 07/16/2016   Procedure: INSERTION PORT-A-CATH RIGHT INTERNAL JUGULAR WITH ULTRASOUND;  Surgeon: Fanny Skates, MD;  Location: [facility];  Service: General;  Laterality: Right;   TOTAL KNEE ARTHROPLASTY  2011   left   TOTAL KNEE ARTHROPLASTY Right 05/10/2014   Procedure: RIGHT TOTAL KNEE ARTHROPLASTY;  Surgeon: Mauri Pole, MD;  Location: [facility];  Service: Orthopedics;  Laterality: Right;   TUBAL LIGATION     WISDOM TOOTH EXTRACTION  2000    SOCIAL HISTORY: Social History   Tobacco Use   Smoking status: Never Smoker   Smokeless tobacco: Never Used  Substance Use Topics   Alcohol use: Not Currently    Comment: rarely   Drug use: No    FAMILY HISTORY: Family History  Problem Relation Age of Onset   Thyroid disease Mother    Hyperlipidemia Mother    Depression Mother    Anxiety disorder Mother    Heart attack Father 65   Hypertension Father    Arthritis Father        RA   Coronary artery disease Father    High Cholesterol Father  Coronary artery disease Brother      Heart disease Brother    Cancer Maternal Aunt        colon   Cancer Maternal Grandmother        colon   Heart attack Maternal Grandfather    Alcohol abuse Paternal Grandfather     ROS: Review of Systems  Constitutional: Positive for weight loss.  Gastrointestinal: Negative for nausea and vomiting.  Musculoskeletal:       Negative for muscle weakness  Endo/Heme/Allergies:       Negative for hypoglycemia    PHYSICAL EXAM: Blood pressure (!) 142/84, pulse 72, temperature 98.5 F (36.9 C), temperature source Oral, height 5\' 5"  (1.651 m), weight 267 lb (121.1 kg), SpO2 97 %. Body mass index is 44.43 kg/m. Physical Exam Vitals signs reviewed.  Constitutional:      Appearance: Normal appearance. She is well-developed. She is obese.  Cardiovascular:     Rate and Rhythm: Normal rate.  Pulmonary:     Effort: Pulmonary effort is normal.  Musculoskeletal: Normal range of motion.  Skin:    General: Skin is warm and dry.  Neurological:     Mental Status: She is alert and oriented to person, place, and time.  Psychiatric:        Mood and Affect: Mood normal.        Behavior: Behavior normal.     RECENT LABS AND TESTS: BMET    Component Value Date/Time   NA 142 07/02/2018 1230   NA 145 04/11/2017 0942   K 5.0 07/02/2018 1230   K 4.4 04/11/2017 0942   CL 100 07/02/2018 1230   CO2 22 07/02/2018 1230   CO2 26 04/11/2017 0942   GLUCOSE 118 (H) 07/02/2018 1230   GLUCOSE 221 (H) 12/22/2017 0907   GLUCOSE 156 (H) 04/11/2017 0942   BUN 24 07/02/2018 1230   BUN 15.1 04/11/2017 0942   CREATININE 0.80 07/02/2018 1230   CREATININE 0.87 10/06/2017 1010   CREATININE 0.8 04/11/2017 0942   CALCIUM 10.3 07/02/2018 1230   CALCIUM 10.0 04/11/2017 0942   GFRNONAA 77 07/02/2018 1230   GFRNONAA >60 10/06/2017 1010   GFRAA 88 07/02/2018 1230   GFRAA >60 10/06/2017 1010   Lab Results  Component Value Date   HGBA1C 6.3 (H) 07/02/2018   HGBA1C 9.5 (H) 03/26/2018   HGBA1C 8.7  (H) 12/22/2017   HGBA1C 7.5 (H) 09/22/2017   HGBA1C 7.2 (H) 05/22/2017   Lab Results  Component Value Date   INSULIN 59.3 (H) 07/02/2018   INSULIN 57.0 (H) 03/26/2018   CBC    Component Value Date/Time   WBC 9.0 03/26/2018 1024   WBC 7.5 12/22/2017 0907   RBC 4.95 03/26/2018 1024   RBC 4.87 12/22/2017 0907   HGB 12.4 03/26/2018 1024   HGB 12.2 04/11/2017 0942   HCT 40.3 03/26/2018 1024   HCT 39.1 04/11/2017 0942   PLT 282.0 12/22/2017 0907   PLT 274 10/06/2017 1010   PLT 240 04/11/2017 0942   MCV 81 03/26/2018 1024   MCV 81.6 04/11/2017 0942   MCH 25.1 (L) 03/26/2018 1024   MCH 25.6 10/06/2017 1010   MCHC 30.8 (L) 03/26/2018 1024   MCHC 31.1 12/22/2017 0907   RDW 17.1 (H) 03/26/2018 1024   RDW 19.2 (H) 04/11/2017 0942   LYMPHSABS 1.5 03/26/2018 1024   LYMPHSABS 1.4 04/11/2017 0942   MONOABS 0.5 12/22/2017 0907   MONOABS 0.3 04/11/2017 0942   EOSABS 0.0 03/26/2018 1024   BASOSABS 0.0  03/26/2018 1024   BASOSABS 0.0 04/11/2017 0942   Iron/TIBC/Ferritin/ %Sat No results found for: IRON, TIBC, FERRITIN, IRONPCTSAT Lipid Panel     Component Value Date/Time   CHOL 126 03/26/2018 1024   TRIG 188 (H) 03/26/2018 1024   HDL 34 (L) 03/26/2018 1024   CHOLHDL 3 12/22/2017 0907   VLDL 33.2 12/22/2017 0907   LDLCALC 54 03/26/2018 1024   LDLDIRECT 108.0 10/08/2016 0849   Hepatic Function Panel     Component Value Date/Time   PROT 7.1 07/02/2018 1230   PROT 7.5 04/11/2017 0942   ALBUMIN 4.1 07/02/2018 1230   ALBUMIN 3.6 04/11/2017 0942   AST 19 07/02/2018 1230   AST 33 10/06/2017 1010   AST 41 (H) 04/11/2017 0942   ALT 24 07/02/2018 1230   ALT 30 10/06/2017 1010   ALT 40 04/11/2017 0942   ALKPHOS 136 (H) 07/02/2018 1230   ALKPHOS 93 04/11/2017 0942   BILITOT 0.3 07/02/2018 1230   BILITOT 0.5 10/06/2017 1010   BILITOT 0.48 04/11/2017 0942   BILIDIR 0.15 06/19/2017 0950   IBILI 0.3 05/24/2016 0828      Component Value Date/Time   TSH 2.130 03/26/2018 1024   TSH  1.87 12/22/2017 0907   TSH 0.81 09/22/2017 0850     Ref. Range 07/02/2018 12:30  Vitamin D, 25-Hydroxy Latest Ref Range: 30.0 - 100.0 ng/mL 31.4    OBESITY BEHAVIORAL INTERVENTION VISIT  Today's visit was # 18   Starting weight: 323 lbs Starting date: 03/26/2018 Today's weight : 267 lbs Today's date: 12/21/2018 Total lbs lost to date: 56    12/21/2018  Height 5\' 5"  (1.651 m)  Weight 267 lb (121.1 kg)  BMI (Calculated) 44.43  BLOOD PRESSURE - SYSTOLIC 852  BLOOD PRESSURE - DIASTOLIC 84   Body Fat % 77.8 %    ASK: We discussed the diagnosis of obesity with [name] today and [name] agreed to give [name] permission to discuss obesity behavioral modification therapy today.  ASSESS: [name] has the diagnosis of obesity and her BMI today is 44.43 [name] is in the action stage of change   ADVISE: [name] was educated on the multiple health risks of obesity as well as the benefit of weight loss to improve her health. She was advised of the need for long term treatment and the importance of lifestyle modifications to improve her current health and to decrease her risk of future health problems.  AGREE: Multiple dietary modification options and treatment options were discussed and  [name] agreed to follow the recommendations documented in the above note.  ARRANGE: [name] was educated on the importance of frequent visits to treat obesity as outlined per CMS and USPSTF guidelines and agreed to schedule her next follow up appointment today.  Corey Skains, am acting as Location manager for [employer]. Owens Shark, DO  I have reviewed the above documentation for accuracy and completeness, and I agree with the above. -Jearld Lesch, DO

## 2018-12-24 ENCOUNTER — Other Ambulatory Visit: Payer: Self-pay

## 2018-12-24 NOTE — Patient Outreach (Signed)
[facility]) Care Management  12/24/2018   [name] 11-Oct-1950 [id number]  Subjective: Successful call to the patient. Two patient identifiers given.  The patient states she is doing great.  She denies any pain or falls.  She states her blood sugars have been running in the morning around 100 and after 2 hours eating.  She continues to follow her diet plan. In total since last year she has lost a total of 57 lbs.  She states she walks 30 minutes 3x/week.  She has purchased a video call body Groove that she plans to try. She is taking her medications as prescribe.  Encouraged the patient to continue her efforts of bettering her health.  She has an appointment to go back to her physician office on July 20th.   Current Medications:  Current Outpatient Medications  Medication Sig Dispense Refill  . acetaminophen (TYLENOL) 500 MG tablet Take 1,000 mg by mouth 2 (two) times daily as needed for moderate pain or headache.    . albuterol (PROVENTIL HFA;VENTOLIN HFA) 108 (90 Base) MCG/ACT inhaler Inhale 2 puffs into the lungs every 6 (six) hours as needed for wheezing or shortness of breath. 1 Inhaler 12  . anastrozole (ARIMIDEX) 1 MG tablet TAKE 1 TABLET (1 MG TOTAL) BY MOUTH DAILY. 90 tablet 3  . aspirin EC 81 MG tablet Take 81 mg by mouth at bedtime.     Marland Kitchen BREO ELLIPTA 100-25 MCG/INH AEPB INHALE 1 PUFF INTO LUNGS ONCE A DAY 60 each 0  . BYSTOLIC 10 MG tablet TAKE 1 TABLET (10 MG TOTAL) BY MOUTH AT BEDTIME. 90 tablet 2  . diclofenac (VOLTAREN) 75 MG EC tablet Take 1 tablet (75 mg total) by mouth 2 (two) times daily. 180 tablet 1  . diltiazem (CARDIZEM CD) 120 MG 24 hr capsule TAKE 1 CAPSULE (120 MG TOTAL) BY MOUTH DAILY. 90 capsule 2  . escitalopram (LEXAPRO) 20 MG tablet Take 1 tablet (20 mg total) by mouth daily. 90 tablet 1  . esomeprazole (NEXIUM) 40 MG capsule TAKE 1 CAPSULE EVERY DAY 90 capsule 2  . ezetimibe (ZETIA) 10 MG tablet TAKE 1 TABLET EVERY DAY 90 tablet 3  .  fluticasone (FLONASE) 50 MCG/ACT nasal spray Place 2 sprays into both nostrils daily. 16 g 6  . furosemide (LASIX) 20 MG tablet TAKE 1 TABLET (20 MG TOTAL) BY MOUTH DAILY. 90 tablet 2  . glucose blood (ACCU-CHEK AVIVA) test strip Twice daily (please use brand that patient insurance covers) 100 each 5  . Hypromellose (ARTIFICIAL TEARS OP) Place 1 drop into both eyes daily as needed (dry eyes).     Marland Kitchen loratadine (CLARITIN) 10 MG tablet Take 1 tablet (10 mg total) by mouth daily. 30 tablet 11  . losartan (COZAAR) 100 MG tablet TAKE 1 TABLET EVERY DAY 90 tablet 2  . metFORMIN (GLUCOPHAGE) 500 MG tablet TAKE 1 TABLET TWICE DAILY WITH A MEAL 180 tablet 0  . montelukast (SINGULAIR) 10 MG tablet TAKE 1 TABLET (10 MG TOTAL) BY MOUTH AT BEDTIME. 90 tablet 2  . Probiotic Product (PROBIOTIC PO) Take 1 capsule by mouth at bedtime.    . ranitidine (ZANTAC) 150 MG tablet TAKE 1 TABLET (150 MG TOTAL) BY MOUTH AT BEDTIME. 90 tablet 1  . rosuvastatin (CRESTOR) 40 MG tablet TAKE 1 TABLET EVERY DAY 90 tablet 3  . Semaglutide,0.25 or 0.5MG /DOS, (OZEMPIC, 0.25 OR 0.5 MG/DOSE,) 2 MG/1.5ML SOPN Inject 0.5 mg into the skin once a week. 4 pen 0  No current facility-administered medications for this visit.     Functional Status:  In your present state of health, do you have any difficulty performing the following activities: 09/18/2018  Hearing? N  Vision? N  Difficulty concentrating or making decisions? N  Walking or climbing stairs? N  Dressing or bathing? N  Doing errands, shopping? N  Preparing Food and eating ? N  Using the Toilet? N  In the past six months, have you accidently leaked urine? N  Do you have problems with loss of bowel control? N  Managing your Medications? N  Managing your Finances? N  Housekeeping or managing your Housekeeping? N  Some recent data might be hidden    Fall/Depression Screening: Fall Risk  12/24/2018 09/23/2018 09/18/2018  Falls in the past year? 0 1 1  Number falls in past  yr: - 0 0  Injury with Fall? - 0 0  Comment - patient stated that she bruised her knees -  Risk for fall due to : - Other (Comment) -  Risk for fall due to: Comment - Patient states that it was just a "weird accident" she tripped over her feet. -  Follow up - Education provided -   [facility] 2/9 Scores 09/18/2018 08/13/2018 07/16/2018 05/27/2018 05/07/2018 04/22/2018 04/08/2018  PHQ - 2 Score 0 0 2 0 0 2 2  PHQ- 9 Score - 2 5 0 1 4 6   Exception Documentation - - - - - - -    Assessment: Patient will continue to benefit from health coach outreach for disease management and support. [facility] CM Care Plan Problem One     Most Recent Value  [facility] Long Term Goal   In 90 days the patient will maintain an a1c of 6.3.  [facility] Long Term Goal Start Date  12/24/18  Interventions for Problem One Long Term Goal  Reviewed diet , patient will continue to follow her diet plan and go to appointments, Encouraged exercise, she will continue to walk 30 minutes 3x/week and body groove video,  Encouraged medication adherence.     Plan: RN Health Coach will contact patient in the month of October and patient agrees to next outreach.    Lazaro Arms RN, BSN, [place] Direct Dial:  [phone]  Fax: [phone]

## 2019-01-11 ENCOUNTER — Other Ambulatory Visit: Payer: Self-pay

## 2019-01-11 ENCOUNTER — Ambulatory Visit (INDEPENDENT_AMBULATORY_CARE_PROVIDER_SITE_OTHER): Payer: Medicare HMO | Admitting: Bariatrics

## 2019-01-11 DIAGNOSIS — I10 Essential (primary) hypertension: Secondary | ICD-10-CM

## 2019-01-11 DIAGNOSIS — E1165 Type 2 diabetes mellitus with hyperglycemia: Secondary | ICD-10-CM | POA: Diagnosis not present

## 2019-01-11 DIAGNOSIS — Z6841 Body Mass Index (BMI) 40.0 and over, adult: Secondary | ICD-10-CM | POA: Diagnosis not present

## 2019-01-13 NOTE — Progress Notes (Signed)
Office: [phone]  /  Fax: [phone] TeleHealth Visit:  [name] has verbally consented to this TeleHealth visit today. The patient is located at home, the provider is located at the [employer] [facility] office. The participants in this visit include the listed provider and patient and any and all parties involved. The visit was conducted today via FaceTime.  HPI:   Chief Complaint: OBESITY [name] is here to discuss her progress with her obesity treatment plan. She is on the Category 4 plan and is following her eating plan approximately 50 % of the time. She states she is swimming and walking 60 minutes 4 times per week. [name] states that she has gained 4 to 5 pounds (weight 271 lbs). She went to the beach with her children. Her eating "was off", and more stuff. She is drinking more water. We were unable to weigh the patient today for this TeleHealth visit. She feels as if she has gained weight since her last visit. She has lost 62 lbs since starting treatment with [name].  Diabetes II with hyperglycemia [name] has a diagnosis of diabetes type II. [name] states fasting BGs range in the 100's and she denies any hypoglycemic episodes. She has been working on intensive lifestyle modifications including diet, exercise, and weight loss to help control her blood glucose levels.  Hypertension [name] is a 68 y.o. female with hypertension. [name] denies chest pain or shortness of breath on exertion. She is working weight loss to help control her blood pressure with the goal of decreasing her risk of heart attack and stroke. [name] blood pressure is well controlled.  ASSESSMENT AND PLAN:  Uncontrolled type 2 diabetes mellitus with hyperglycemia (HCC)  Essential hypertension  Class 3 severe obesity with serious comorbidity and body mass index (BMI) of 40.0 to 44.9 in adult, unspecified obesity type ([place])  PLAN:  Diabetes II with hyperglycemia [name]  has been given extensive diabetes education by myself today including ideal fasting and post-prandial blood glucose readings, individual ideal Hgb A1c goals and hypoglycemia prevention. We discussed the importance of good blood sugar control to decrease the likelihood of diabetic complications such as nephropathy, neuropathy, limb loss, blindness, coronary artery disease, and death. We discussed the importance of intensive lifestyle modification including diet, exercise and weight loss as the first line treatment for diabetes. [name] agrees to continue her diabetes medications and will follow up at the agreed upon time.  Hypertension We discussed sodium restriction, working on healthy weight loss, and a regular exercise program as the means to achieve improved blood pressure control. [name] agreed with this plan and agreed to follow up as directed. We will continue to monitor her blood pressure as well as her progress with the above lifestyle modifications. She will continue her medications as prescribed and will watch for signs of hypotension as she continues her lifestyle modifications.  Obesity [name] is currently in the action stage of change. As such, her goal is to continue with weight loss efforts She has agreed to follow the Category 4 plan [name] will increase exercise for weight loss and overall health benefits. She has a "Groove and Movement" videotape. We discussed the following Behavioral Modification Strategies today: planning for success, increase H2O intake, no skipping meals, keeping healthy foods in the home, increasing lean protein intake, decreasing simple carbohydrates, increasing vegetables, decrease eating out and work on meal planning and intentional eating  [name] has agreed to follow up with our clinic in 2 weeks.  She was informed of the importance of frequent follow up visits to maximize her success with intensive lifestyle modifications for her multiple health conditions.   ALLERGIES: Allergies  Allergen Reactions  . No Known Allergies     MEDICATIONS: Current Outpatient Medications on File Prior to Visit  Medication Sig Dispense Refill  . acetaminophen (TYLENOL) 500 MG tablet Take 1,000 mg by mouth 2 (two) times daily as needed for moderate pain or headache.    . albuterol (PROVENTIL HFA;VENTOLIN HFA) 108 (90 Base) MCG/ACT inhaler Inhale 2 puffs into the lungs every 6 (six) hours as needed for wheezing or shortness of breath. 1 Inhaler 12  . anastrozole (ARIMIDEX) 1 MG tablet TAKE 1 TABLET (1 MG TOTAL) BY MOUTH DAILY. 90 tablet 3  . aspirin EC 81 MG tablet Take 81 mg by mouth at bedtime.     [name] BREO ELLIPTA 100-25 MCG/INH AEPB INHALE 1 PUFF INTO LUNGS ONCE A DAY 60 each 0  . BYSTOLIC 10 MG tablet TAKE 1 TABLET (10 MG TOTAL) BY MOUTH AT BEDTIME. 90 tablet 2  . diclofenac (VOLTAREN) 75 MG EC tablet Take 1 tablet (75 mg total) by mouth 2 (two) times daily. 180 tablet 1  . diltiazem (CARDIZEM CD) 120 MG 24 hr capsule TAKE 1 CAPSULE (120 MG TOTAL) BY MOUTH DAILY. 90 capsule 2  . escitalopram (LEXAPRO) 20 MG tablet Take 1 tablet (20 mg total) by mouth daily. 90 tablet 1  . esomeprazole (NEXIUM) 40 MG capsule TAKE 1 CAPSULE EVERY DAY 90 capsule 2  . ezetimibe (ZETIA) 10 MG tablet TAKE 1 TABLET EVERY DAY 90 tablet 3  . fluticasone (FLONASE) 50 MCG/ACT nasal spray Place 2 sprays into both nostrils daily. 16 g 6  . furosemide (LASIX) 20 MG tablet TAKE 1 TABLET (20 MG TOTAL) BY MOUTH DAILY. 90 tablet 2  . glucose blood (ACCU-CHEK AVIVA) test strip Twice daily (please use brand that patient insurance covers) 100 each 5  . Hypromellose (ARTIFICIAL TEARS OP) Place 1 drop into both eyes daily as needed (dry eyes).     [name] loratadine (CLARITIN) 10 MG tablet Take 1 tablet (10 mg total) by mouth daily. 30 tablet 11  . losartan (COZAAR) 100 MG tablet TAKE 1 TABLET EVERY DAY 90 tablet 2  . metFORMIN (GLUCOPHAGE) 500 MG tablet TAKE 1 TABLET TWICE DAILY WITH A MEAL 180 tablet 0   . montelukast (SINGULAIR) 10 MG tablet TAKE 1 TABLET (10 MG TOTAL) BY MOUTH AT BEDTIME. 90 tablet 2  . Probiotic Product (PROBIOTIC PO) Take 1 capsule by mouth at bedtime.    . ranitidine (ZANTAC) 150 MG tablet TAKE 1 TABLET (150 MG TOTAL) BY MOUTH AT BEDTIME. 90 tablet 1  . rosuvastatin (CRESTOR) 40 MG tablet TAKE 1 TABLET EVERY DAY 90 tablet 3  . Semaglutide,0.25 or 0.5MG /DOS, (OZEMPIC, 0.25 OR 0.5 MG/DOSE,) 2 MG/1.5ML SOPN Inject 0.5 mg into the skin once a week. 4 pen 0   No current facility-administered medications on file prior to visit.     PAST MEDICAL HISTORY: Past Medical History:  Diagnosis Date  . Anemia 04/05/2012  . Anxiety   . Anxiety state 09/11/2007   Qualifier: Diagnosis of  By: Scherrie Gerlach    . Asthma   . Atrial tachycardia, paroxysmal ([place])   . Back pain 10/02/2014  . Breast cancer ([place]) 02/17/2017  . Chicken pox as a child  . Chronic diastolic CHF (congestive heart failure), NYHA class 1 ([place])   . Complication of anesthesia  pt states feels different in her body after anesthesia when waking up and also experiences a smell of burnt plastic for approx a wk   . Constipation   . Depression   . Diabetes ([place])   . Dilated aortic root ([name])    28mm by echo 09/2016  . Dysuria 02/17/2017  . Elevated LFTs 04/05/2012  . GERD (gastroesophageal reflux disease)   . Goiter   . Heart murmur    hx of one at birth   . History of kidney stones   . History of radiation therapy 10/31/16-12/17/16   left breast 45 Gy in 25 fractions, left breast boost 16 Gy in 8 fractions  . Hx of colonic polyps   . Hyperlipidemia   . Hypertension   . Insomnia 10/08/2016  . Joint pain   . Malignant neoplasm of upper-outer quadrant of left female breast ([place]) 05/14/2016   not with patient  . Measles as a child  . Mumps as a child  . OA (osteoarthritis) of knee 04/05/2012  . Obesity 07/11/2014  . PONV (postoperative nausea and vomiting)   . Preventative health care 09/05/2013  . PVC's  (premature ventricular contractions) 05/02/2016  . Shortness of breath dyspnea    walking distances / climbing stairs  . Sleep apnea 04/05/2012  . Tinea corporis 02/23/2013  . Type 2 diabetes mellitus with hyperglycemia ([place]) 12/31/2013  . Vasomotor rhinitis 04/05/2012  . Ventral hernia   . Wheezing     PAST SURGICAL HISTORY: Past Surgical History:  Procedure Laterality Date  . BREAST LUMPECTOMY Left 2017  . BREAST LUMPECTOMY WITH RADIOACTIVE SEED AND SENTINEL LYMPH NODE BIOPSY Left 06/21/2016   Procedure: LEFT BREAST LUMPECTOMY WITH RADIOACTIVE SEED AND SENTINEL LYMPH NODE BIOPSY, INJECT BLUE DYE LEFT BREAST;  Surgeon: Fanny Skates, MD;  Location: [facility];  Service: General;  Laterality: Left;  . CARDIAC CATHETERIZATION     normal coroary arteries per patient  . CARDIOVASCULAR STRESS TEST     10/12/2013  . CESAREAN SECTION     X 3  . CHOLECYSTECTOMY    . COLONOSCOPY WITH PROPOFOL N/A 03/28/2015   Procedure: COLONOSCOPY WITH PROPOFOL;  Surgeon: Juanita Craver, MD;  Location: [facility];  Service: Endoscopy;  Laterality: N/A;  . HERNIA REPAIR  02/08/11   ventral hernia  . JOINT REPLACEMENT     bilateral  . KNEE ARTHROSCOPY  05/2010   bilateral  . LEFT AND RIGHT HEART CATHETERIZATION WITH CORONARY ANGIOGRAM N/A 11/08/2013   Procedure: LEFT AND RIGHT HEART CATHETERIZATION WITH CORONARY ANGIOGRAM;  Surgeon: [name], MD;  [facility];  Service: Cardiovascular;  Laterality: N/A;  . MENISCUS REPAIR  2009  . MOUTH SURGERY     teeth implants  . PORT-A-CATH REMOVAL N/A 01/24/2017   Procedure: REMOVAL PORT-A-CATH;  Surgeon: Fanny Skates, MD;  Location: [facility];  Service: General;  Laterality: N/A;  . PORTACATH PLACEMENT Right 07/16/2016   Procedure: INSERTION PORT-A-CATH RIGHT INTERNAL JUGULAR WITH ULTRASOUND;  Surgeon: Fanny Skates, MD;  Location: [facility];  Service: General;  Laterality: Right;  . TOTAL KNEE ARTHROPLASTY  2011   left  . TOTAL KNEE ARTHROPLASTY Right  05/10/2014   Procedure: RIGHT TOTAL KNEE ARTHROPLASTY;  Surgeon: Mauri Pole, MD;  Location: [facility];  Service: Orthopedics;  Laterality: Right;  . TUBAL LIGATION    . WISDOM TOOTH EXTRACTION  2000    SOCIAL HISTORY: Social History   Tobacco Use  . Smoking status: Never Smoker  . Smokeless tobacco: Never  Used  Substance Use Topics  . Alcohol use: Not Currently    Comment: rarely  . Drug use: No    FAMILY HISTORY: Family History  Problem Relation Age of Onset  . Thyroid disease Mother   . Hyperlipidemia Mother   . Depression Mother   . Anxiety disorder Mother   . Heart attack Father 60  . Hypertension Father   . Arthritis Father        RA  . Coronary artery disease Father   . High Cholesterol Father   . Coronary artery disease Brother   . Heart disease Brother   . Cancer Maternal Aunt        colon  . Cancer Maternal Grandmother        colon  . Heart attack Maternal Grandfather   . Alcohol abuse Paternal Grandfather     ROS: Review of Systems  Constitutional: Negative for weight loss.  Respiratory: Negative for shortness of breath (on exertion).   Cardiovascular: Negative for chest pain.  Endo/Heme/Allergies:       Negative for hypoglycemia    PHYSICAL EXAM: Pt in no acute distress  RECENT LABS AND TESTS: BMET    Component Value Date/Time   NA 142 07/02/2018 1230   NA 145 04/11/2017 0942   K 5.0 07/02/2018 1230   K 4.4 04/11/2017 0942   CL 100 07/02/2018 1230   CO2 22 07/02/2018 1230   CO2 26 04/11/2017 0942   GLUCOSE 118 (H) 07/02/2018 1230   GLUCOSE 221 (H) 12/22/2017 0907   GLUCOSE 156 (H) 04/11/2017 0942   BUN 24 07/02/2018 1230   BUN 15.1 04/11/2017 0942   CREATININE 0.80 07/02/2018 1230   CREATININE 0.87 10/06/2017 1010   CREATININE 0.8 04/11/2017 0942   CALCIUM 10.3 07/02/2018 1230   CALCIUM 10.0 04/11/2017 0942   GFRNONAA 77 07/02/2018 1230   GFRNONAA >60 10/06/2017 1010   GFRAA 88 07/02/2018 1230   GFRAA >60 10/06/2017 1010   Lab  Results  Component Value Date   HGBA1C 6.3 (H) 07/02/2018   HGBA1C 9.5 (H) 03/26/2018   HGBA1C 8.7 (H) 12/22/2017   HGBA1C 7.5 (H) 09/22/2017   HGBA1C 7.2 (H) 05/22/2017   Lab Results  Component Value Date   INSULIN 59.3 (H) 07/02/2018   INSULIN 57.0 (H) 03/26/2018   CBC    Component Value Date/Time   WBC 9.0 03/26/2018 1024   WBC 7.5 12/22/2017 0907   RBC 4.95 03/26/2018 1024   RBC 4.87 12/22/2017 0907   HGB 12.4 03/26/2018 1024   HGB 12.2 04/11/2017 0942   HCT 40.3 03/26/2018 1024   HCT 39.1 04/11/2017 0942   PLT 282.0 12/22/2017 0907   PLT 274 10/06/2017 1010   PLT 240 04/11/2017 0942   MCV 81 03/26/2018 1024   MCV 81.6 04/11/2017 0942   MCH 25.1 (L) 03/26/2018 1024   MCH 25.6 10/06/2017 1010   MCHC 30.8 (L) 03/26/2018 1024   MCHC 31.1 12/22/2017 0907   RDW 17.1 (H) 03/26/2018 1024   RDW 19.2 (H) 04/11/2017 0942   LYMPHSABS 1.5 03/26/2018 1024   LYMPHSABS 1.4 04/11/2017 0942   MONOABS 0.5 12/22/2017 0907   MONOABS 0.3 04/11/2017 0942   EOSABS 0.0 03/26/2018 1024   BASOSABS 0.0 03/26/2018 1024   BASOSABS 0.0 04/11/2017 0942   Iron/TIBC/Ferritin/ %Sat No results found for: IRON, TIBC, FERRITIN, IRONPCTSAT Lipid Panel     Component Value Date/Time   CHOL 126 03/26/2018 1024   TRIG 188 (H) 03/26/2018 1024  HDL 34 (L) 03/26/2018 1024   CHOLHDL 3 12/22/2017 0907   VLDL 33.2 12/22/2017 0907   LDLCALC 54 03/26/2018 1024   LDLDIRECT 108.0 10/08/2016 0849   Hepatic Function Panel     Component Value Date/Time   PROT 7.1 07/02/2018 1230   PROT 7.5 04/11/2017 0942   ALBUMIN 4.1 07/02/2018 1230   ALBUMIN 3.6 04/11/2017 0942   AST 19 07/02/2018 1230   AST 33 10/06/2017 1010   AST 41 (H) 04/11/2017 0942   ALT 24 07/02/2018 1230   ALT 30 10/06/2017 1010   ALT 40 04/11/2017 0942   ALKPHOS 136 (H) 07/02/2018 1230   ALKPHOS 93 04/11/2017 0942   BILITOT 0.3 07/02/2018 1230   BILITOT 0.5 10/06/2017 1010   BILITOT 0.48 04/11/2017 0942   BILIDIR 0.15 06/19/2017  0950   IBILI 0.3 05/24/2016 0828      Component Value Date/Time   TSH 2.130 03/26/2018 1024   TSH 1.87 12/22/2017 0907   TSH 0.81 09/22/2017 0850     Ref. Range 07/02/2018 12:30  Vitamin D, 25-Hydroxy Latest Ref Range: 30.0 - 100.0 ng/mL 31.4    I, Doreene Nest, am acting as Location manager for [employer]. Owens Shark, DO  I have reviewed the above documentation for accuracy and completeness, and I agree with the above. -Jearld Lesch, DO

## 2019-01-14 ENCOUNTER — Encounter (INDEPENDENT_AMBULATORY_CARE_PROVIDER_SITE_OTHER): Payer: Self-pay | Admitting: Bariatrics

## 2019-01-20 DIAGNOSIS — G4733 Obstructive sleep apnea (adult) (pediatric): Secondary | ICD-10-CM | POA: Diagnosis not present

## 2019-01-20 DIAGNOSIS — Z96651 Presence of right artificial knee joint: Secondary | ICD-10-CM | POA: Diagnosis not present

## 2019-01-22 ENCOUNTER — Other Ambulatory Visit: Payer: Self-pay | Admitting: Family Medicine

## 2019-01-27 ENCOUNTER — Ambulatory Visit (INDEPENDENT_AMBULATORY_CARE_PROVIDER_SITE_OTHER): Payer: Medicare HMO | Admitting: Bariatrics

## 2019-02-01 ENCOUNTER — Other Ambulatory Visit: Payer: Self-pay | Admitting: Family Medicine

## 2019-02-12 ENCOUNTER — Other Ambulatory Visit: Payer: Self-pay | Admitting: Family Medicine

## 2019-02-17 ENCOUNTER — Other Ambulatory Visit: Payer: Self-pay | Admitting: Family Medicine

## 2019-02-20 DIAGNOSIS — G4733 Obstructive sleep apnea (adult) (pediatric): Secondary | ICD-10-CM | POA: Diagnosis not present

## 2019-02-20 DIAGNOSIS — Z96651 Presence of right artificial knee joint: Secondary | ICD-10-CM | POA: Diagnosis not present

## 2019-02-25 ENCOUNTER — Other Ambulatory Visit: Payer: Self-pay | Admitting: Family Medicine

## 2019-03-23 DIAGNOSIS — Z96651 Presence of right artificial knee joint: Secondary | ICD-10-CM | POA: Diagnosis not present

## 2019-03-23 DIAGNOSIS — G4733 Obstructive sleep apnea (adult) (pediatric): Secondary | ICD-10-CM | POA: Diagnosis not present

## 2019-03-25 ENCOUNTER — Telehealth: Payer: Self-pay

## 2019-03-25 NOTE — Telephone Encounter (Signed)
Needs virtual visit please.  

## 2019-03-25 NOTE — Telephone Encounter (Signed)
Appt scheduled w/ Dr. Wendling tomorrow.  

## 2019-03-25 NOTE — Telephone Encounter (Signed)
Copied from [name] [phone]. Topic: General - Other >> Mar 25, 2019 11:22 AM Leward Quan A wrote: Reason for CRM: Patient called to request an antibiotic from Dr Charlett Blake say that she have a sinus infection and Vertigo which recently started and say she is afraid to come in the office because of covid. Asking for Rx to be sent to [facility]. Patient can can be reached at Ph# [phone]

## 2019-03-26 ENCOUNTER — Other Ambulatory Visit: Payer: Self-pay

## 2019-03-26 ENCOUNTER — Encounter: Payer: Self-pay | Admitting: Family Medicine

## 2019-03-26 ENCOUNTER — Ambulatory Visit (INDEPENDENT_AMBULATORY_CARE_PROVIDER_SITE_OTHER): Payer: Medicare HMO | Admitting: Family Medicine

## 2019-03-26 DIAGNOSIS — J0100 Acute maxillary sinusitis, unspecified: Secondary | ICD-10-CM

## 2019-03-26 MED ORDER — AMOXICILLIN-POT CLAVULANATE 875-125 MG PO TABS: 1.0000 | ORAL_TABLET | Freq: Two times a day (BID) | ORAL | 0 refills | Status: AC

## 2019-03-26 NOTE — Progress Notes (Signed)
CC: Sinusitis  [name] here for URI complaints. Due to COVID-19 pandemic, we are interacting via web portal for an electronic face-to-face visit. I verified patient's ID using 2 identifiers. Patient agreed to proceed with visit via this method. Patient is at home, I am at office. Patient and I are present for visit.   Duration: 2 weeks  Associated symptoms: sinus congestion, sinus pain, rhinorrhea, itchy watery eyes, ear pain, dental pain, and vertigo Denies: ear drainage, sore throat, wheezing, shortness of breath, cough, myalgia and fevers Treatment to date: INCS, Claritin Sick contacts: No  ROS:  Const: Denies fevers HEENT: As noted in HPI Lungs: No SOB  Past Medical History:  Diagnosis Date  . Anemia 04/05/2012  . Anxiety   . Anxiety state 09/11/2007   Qualifier: Diagnosis of  By: Scherrie Gerlach    . Asthma   . Atrial tachycardia, paroxysmal ([place])   . Back pain 10/02/2014  . Breast cancer ([place]) 02/17/2017  . Chicken pox as a child  . Chronic diastolic CHF (congestive heart failure), NYHA class 1 ([place])   . Complication of anesthesia    pt states feels different in her body after anesthesia when waking up and also experiences a smell of burnt plastic for approx a wk   . Constipation   . Depression   . Diabetes ([place])   . Dilated aortic root ([place])    35mm by echo 09/2016  . Dysuria 02/17/2017  . Elevated LFTs 04/05/2012  . GERD (gastroesophageal reflux disease)   . Goiter   . Heart murmur    hx of one at birth   . History of kidney stones   . History of radiation therapy 10/31/16-12/17/16   left breast 45 Gy in 25 fractions, left breast boost 16 Gy in 8 fractions  . Hx of colonic polyps   . Hyperlipidemia   . Hypertension   . Insomnia 10/08/2016  . Joint pain   . Malignant neoplasm of upper-outer quadrant of left female breast ([place]) 05/14/2016   not with patient  . Measles as a child  . Mumps as a child  . OA (osteoarthritis) of knee 04/05/2012  . Obesity  07/11/2014  . PONV (postoperative nausea and vomiting)   . Preventative health care 09/05/2013  . PVC's (premature ventricular contractions) 05/02/2016  . Shortness of breath dyspnea    walking distances / climbing stairs  . Sleep apnea 04/05/2012  . Tinea corporis 02/23/2013  . Type 2 diabetes mellitus with hyperglycemia ([place]) 12/31/2013  . Vasomotor rhinitis 04/05/2012  . Ventral hernia   . Wheezing    Exam No conversational dyspnea Age appropriate judgment and insight Nml affect and mood  Acute maxillary sinusitis, recurrence not specified - Plan: amoxicillin-clavulanate (AUGMENTIN) 875-125 MG tablet  Given duration, will tx.  Continue to push fluids, practice good hand hygiene, cover mouth when coughing. F/u prn. If starting to experience fevers, shaking, or shortness of breath, seek immediate care. Pt voiced understanding and agreement to the plan.  [place], DO 03/26/19 2:09 PM

## 2019-04-02 ENCOUNTER — Ambulatory Visit: Payer: Medicare HMO

## 2019-04-02 ENCOUNTER — Other Ambulatory Visit: Payer: Self-pay

## 2019-04-02 NOTE — Patient Outreach (Signed)
[facility]) Care Management  [place]  04/02/2019   [name] Oct 26, 1950 [id number]  Subjective:  Telephone call to patient .  She reports she is still dealing with a sinusitis infection.  She states she has some dizziness and states if that goes away she will be fine.  Discussed increased fluid between ears from sinus infection causing some dizziness and to walk with caution.  Denies any falls and states that she has been taking it easy and that her daughter brings things over.  Discussed using steam to help with nasal congestion.  She is taking allergy medications.  Advised patient that if she does not get any better to contact her physician.  She reports that her sugars are running around 100.  Encouraged patient to continue regimen with her sugars. She verbalized understanding.    Objective:   Encounter Medications:  Outpatient Encounter Medications as of 04/02/2019  Medication Sig  . acetaminophen (TYLENOL) 500 MG tablet Take 1,000 mg by mouth 2 (two) times daily as needed for moderate pain or headache.  . albuterol (PROVENTIL HFA;VENTOLIN HFA) 108 (90 Base) MCG/ACT inhaler Inhale 2 puffs into the lungs every 6 (six) hours as needed for wheezing or shortness of breath.  Marland Kitchen amoxicillin-clavulanate (AUGMENTIN) 875-125 MG tablet Take 1 tablet by mouth 2 (two) times daily for 10 days.  Marland Kitchen anastrozole (ARIMIDEX) 1 MG tablet TAKE 1 TABLET EVERY DAY  . aspirin EC 81 MG tablet Take 81 mg by mouth at bedtime.   Marland Kitchen BREO ELLIPTA 100-25 MCG/INH AEPB INHALE 1 PUFF INTO LUNGS ONCE A DAY  . BYSTOLIC 10 MG tablet TAKE 1 TABLET (10 MG TOTAL) BY MOUTH AT BEDTIME.  . carvedilol (COREG) 6.25 MG tablet TAKE 1 TABLET TWICE DAILY  . diclofenac (VOLTAREN) 75 MG EC tablet TAKE 1 TABLET TWICE DAILY  . diltiazem (CARDIZEM CD) 120 MG 24 hr capsule TAKE 1 CAPSULE (120 MG TOTAL) BY MOUTH DAILY.  Marland Kitchen escitalopram (LEXAPRO) 20 MG tablet Take 1 tablet (20 mg total) by mouth daily.  Marland Kitchen  esomeprazole (NEXIUM) 40 MG capsule TAKE 1 CAPSULE EVERY DAY  . ezetimibe (ZETIA) 10 MG tablet TAKE 1 TABLET EVERY DAY  . fluticasone (FLONASE) 50 MCG/ACT nasal spray Place 2 sprays into both nostrils daily.  . furosemide (LASIX) 20 MG tablet TAKE 1 TABLET (20 MG TOTAL) BY MOUTH DAILY.  Marland Kitchen glucose blood (ACCU-CHEK AVIVA) test strip Twice daily (please use brand that patient insurance covers)  . Hypromellose (ARTIFICIAL TEARS OP) Place 1 drop into both eyes daily as needed (dry eyes).   Marland Kitchen loratadine (CLARITIN) 10 MG tablet Take 1 tablet (10 mg total) by mouth daily.  Marland Kitchen losartan (COZAAR) 100 MG tablet TAKE 1 TABLET EVERY DAY  . metFORMIN (GLUCOPHAGE) 500 MG tablet TAKE 1 TABLET TWICE DAILY WITH A MEAL  . montelukast (SINGULAIR) 10 MG tablet TAKE 1 TABLET (10 MG TOTAL) BY MOUTH AT BEDTIME.  . Probiotic Product (PROBIOTIC PO) Take 1 capsule by mouth at bedtime.  . ranitidine (ZANTAC) 150 MG tablet TAKE 1 TABLET (150 MG TOTAL) BY MOUTH AT BEDTIME.  . rosuvastatin (CRESTOR) 40 MG tablet TAKE 1 TABLET EVERY DAY  . Semaglutide,0.25 or 0.5MG /DOS, (OZEMPIC, 0.25 OR 0.5 MG/DOSE,) 2 MG/1.5ML SOPN Inject 0.5 mg into the skin once a week.   No facility-administered encounter medications on file as of 04/02/2019.     Functional Status:  In your present state of health, do you have any difficulty performing the following activities: 04/02/2019  09/18/2018  Hearing? N N  Vision? N N  Difficulty concentrating or making decisions? N N  Walking or climbing stairs? Y N  Comment knee problems -  Dressing or bathing? N N  Doing errands, shopping? N N  Preparing Food and eating ? N N  Using the Toilet? N N  In the past six months, have you accidently leaked urine? Y N  Comment some incontinence at times -  Do you have problems with loss of bowel control? N N  Managing your Medications? N N  Managing your Finances? N N  Housekeeping or managing your Housekeeping? N N  Some recent data might be hidden     Fall/Depression Screening: Fall Risk  04/02/2019 12/24/2018 09/23/2018  Falls in the past year? 0 0 1  Number falls in past yr: 0 - 0  Injury with Fall? - - 0  Comment - - patient stated that she bruised her knees  Risk for fall due to : - - Other (Comment)  Risk for fall due to: Comment - - Patient states that it was just a "weird accident" she tripped over her feet.  Follow up - - Education provided   [facility] 2/9 Scores 04/02/2019 09/18/2018 08/13/2018 07/16/2018 05/27/2018 05/07/2018 04/22/2018  PHQ - 2 Score 0 0 0 2 0 0 2  PHQ- 9 Score - - 2 5 0 1 4  Exception Documentation - - - - - - -    Assessment: Patient dealing sinus infection-on antibiotic ordered 03-26-2019.   Patient continues to manage her diabetes.   Plan:  [facility] CM Care Plan Problem One     Most Recent Value  Care Plan Problem One  Knowledge deficit related to diabetes managment  Role Documenting the Problem One  [place] for Problem One  Active  [facility] Long Term Goal   In 90 days the patient will maintain an a1c of 6.3.  [facility] Long Term Goal Start Date  12/24/18  Interventions for Problem One Long Term Goal  Discussed diet and importacne of checking sugars.  Discussed medication adherence and encouraged continuing regimen.     RN CM will send letter and calendar. RN CM will contact patient again in January 2021 and patient agreeable.    Jone Baseman, RN, MSN [place] Management Care Management Coordinator Direct Line [phone] Cell [phone] Toll Free: [phone]  Fax: [phone]

## 2019-04-11 NOTE — Progress Notes (Signed)
Patient Care Team: [name], MD as PCP - General (Family Medicine) Fanny Skates, MD as Consulting Physician (General Surgery) Nicholas Lose, MD as Consulting Physician (Hematology and Oncology) Gery Pray, MD as Consulting Physician (Radiation Oncology) Delice Bison, Charlestine Massed, NP as Nurse Practitioner (Hematology and Oncology) Jon Billings, RN as [facility]  DIAGNOSIS:    ICD-10-CM   1. Malignant neoplasm of upper-outer quadrant of left breast in female, estrogen receptor positive ([place])  C50.412    Z17.0     SUMMARY OF ONCOLOGIC HISTORY: Oncology History  Malignant neoplasm of upper-outer quadrant of left female breast ([place])  05/13/2016 Initial Diagnosis   Left breast biopsy 3:00: IDC, grade 3, ER 30%, PR 0%, Ki-67 40%, HER-2 positive ratio 2.71, screening detected left breast mass 9 x 7 x 6 mm, axilla negative, T1 BN 0 stage IA clinical stage   06/21/2016 Surgery   Left lumpectomy: IDC grade 3, 1.1 cm, 1/5 LN Positive with ECE, Margins Neg, LVI Present; Er 30%, PR 0%, Ki 67 40%, Her 2 Positive Ratio 2.71; T1CN1 (Stage 2B)   07/25/2016 - 01/09/2017 Chemotherapy   Taxol weekly 12; Herceptin and Perjeta every 3 weeks     10/31/2016 - 11/28/2016 Radiation Therapy   Adjuvant radiation therapy   12/2016 -  Anti-estrogen oral therapy   Anastrozole daily     CHIEF COMPLIANT: Follow-up on anastrozole therapy  INTERVAL HISTORY: [name] is a 68 y.o. with above-mentioned history of left breast cancer treated with lumpectomy, adjuvant radiation, and who is currently on anastrozole. I last saw her a year ago. Mammogram on 05/14/18 showed no evidence of malignancy bilaterally. She presents to the clinic today for follow-up.  She has occasional hot flashes but otherwise doing very well.  Denies any other complaints related to anastrozole.  Denies any lumps or nodules in the breast.  REVIEW OF SYSTEMS:   Constitutional: Denies fevers,  chills or abnormal weight loss Eyes: Denies blurriness of vision Ears, nose, mouth, throat, and face: Denies mucositis or sore throat Respiratory: Denies cough, dyspnea or wheezes Cardiovascular: Denies palpitation, chest discomfort Gastrointestinal: Denies nausea, heartburn or change in bowel habits Skin: Denies abnormal skin rashes Lymphatics: Denies new lymphadenopathy or easy bruising Neurological: Denies numbness, tingling or new weaknesses Behavioral/Psych: Mood is stable, no new changes  Extremities: No lower extremity edema Breast: denies any pain or lumps or nodules in either breasts All other systems were reviewed with the patient and are negative.  I have reviewed the past medical history, past surgical history, social history and family history with the patient and they are unchanged from previous note.  ALLERGIES:  is allergic to no known allergies.  MEDICATIONS:  Current Outpatient Medications  Medication Sig Dispense Refill  . acetaminophen (TYLENOL) 500 MG tablet Take 1,000 mg by mouth 2 (two) times daily as needed for moderate pain or headache.    . albuterol (PROVENTIL HFA;VENTOLIN HFA) 108 (90 Base) MCG/ACT inhaler Inhale 2 puffs into the lungs every 6 (six) hours as needed for wheezing or shortness of breath. 1 Inhaler 12  . anastrozole (ARIMIDEX) 1 MG tablet TAKE 1 TABLET EVERY DAY 90 tablet 3  . aspirin EC 81 MG tablet Take 81 mg by mouth at bedtime.     Marland Kitchen BREO ELLIPTA 100-25 MCG/INH AEPB INHALE 1 PUFF INTO LUNGS ONCE A DAY 60 each 0  . BYSTOLIC 10 MG tablet TAKE 1 TABLET (10 MG TOTAL) BY MOUTH AT BEDTIME. 90 tablet 2  .  carvedilol (COREG) 6.25 MG tablet TAKE 1 TABLET TWICE DAILY 180 tablet 1  . diclofenac (VOLTAREN) 75 MG EC tablet TAKE 1 TABLET TWICE DAILY 180 tablet 1  . diltiazem (CARDIZEM CD) 120 MG 24 hr capsule TAKE 1 CAPSULE (120 MG TOTAL) BY MOUTH DAILY. 90 capsule 2  . escitalopram (LEXAPRO) 20 MG tablet Take 1 tablet (20 mg total) by mouth daily. 90  tablet 1  . esomeprazole (NEXIUM) 40 MG capsule TAKE 1 CAPSULE EVERY DAY 90 capsule 2  . ezetimibe (ZETIA) 10 MG tablet TAKE 1 TABLET EVERY DAY 90 tablet 3  . fluticasone (FLONASE) 50 MCG/ACT nasal spray Place 2 sprays into both nostrils daily. 16 g 6  . furosemide (LASIX) 20 MG tablet TAKE 1 TABLET (20 MG TOTAL) BY MOUTH DAILY. 90 tablet 2  . glucose blood (ACCU-CHEK AVIVA) test strip Twice daily (please use brand that patient insurance covers) 100 each 5  . Hypromellose (ARTIFICIAL TEARS OP) Place 1 drop into both eyes daily as needed (dry eyes).     Marland Kitchen loratadine (CLARITIN) 10 MG tablet Take 1 tablet (10 mg total) by mouth daily. 30 tablet 11  . losartan (COZAAR) 100 MG tablet TAKE 1 TABLET EVERY DAY 90 tablet 2  . metFORMIN (GLUCOPHAGE) 500 MG tablet TAKE 1 TABLET TWICE DAILY WITH A MEAL 180 tablet 0  . montelukast (SINGULAIR) 10 MG tablet TAKE 1 TABLET (10 MG TOTAL) BY MOUTH AT BEDTIME. 90 tablet 2  . Probiotic Product (PROBIOTIC PO) Take 1 capsule by mouth at bedtime.    . ranitidine (ZANTAC) 150 MG tablet TAKE 1 TABLET (150 MG TOTAL) BY MOUTH AT BEDTIME. 90 tablet 1  . rosuvastatin (CRESTOR) 40 MG tablet TAKE 1 TABLET EVERY DAY 90 tablet 3  . Semaglutide,0.25 or 0.5MG/DOS, (OZEMPIC, 0.25 OR 0.5 MG/DOSE,) 2 MG/1.5ML SOPN Inject 0.5 mg into the skin once a week. 4 pen 0   No current facility-administered medications for this visit.     PHYSICAL EXAMINATION: ECOG PERFORMANCE STATUS: 1 - Symptomatic but completely ambulatory  Vitals:   04/12/19 1125  BP: (!) 131/91  Pulse: 70  Resp: 18  Temp: 98.3 F (36.8 C)  SpO2: 98%   Filed Weights   04/12/19 1125  Weight: 281 lb 8 oz (127.7 kg)    GENERAL: alert, no distress and comfortable SKIN: skin color, texture, turgor are normal, no rashes or significant lesions EYES: normal, Conjunctiva are pink and non-injected, sclera clear OROPHARYNX: no exudate, no erythema and lips, buccal mucosa, and tongue normal  NECK: supple, thyroid  normal size, non-tender, without nodularity LYMPH: no palpable lymphadenopathy in the cervical, axillary or inguinal LUNGS: clear to auscultation and percussion with normal breathing effort HEART: regular rate & rhythm and no murmurs and no lower extremity edema ABDOMEN: abdomen soft, non-tender and normal bowel sounds MUSCULOSKELETAL: no cyanosis of digits and no clubbing  NEURO: alert & oriented x 3 with fluent speech, no focal motor/sensory deficits EXTREMITIES: No lower extremity edema BREAST: No palpable masses or nodules in either right or left breasts. No palpable axillary supraclavicular or infraclavicular adenopathy no breast tenderness or nipple discharge. (exam performed in the presence of a chaperone)  LABORATORY DATA:  I have reviewed the data as listed CMP Latest Ref Rng & Units 07/02/2018 03/26/2018 12/22/2017  Glucose 65 - 99 mg/dL 118(H) 225(H) 221(H)  BUN 8 - 27 mg/dL _0 Creatinine 0.57 - 1.00 mg/dL 0.80 0.94 0.77  Sodium 134 - 144 mmol/L 142 140 142  Potassium 3.5 - 5.2 mmol/L 5.0 4.6 5.3(H)  Chloride 96 - 106 mmol/L 100 98 101  CO2 20 - 29 mmol/L _0 Calcium 8.7 - 10.3 mg/dL 10.3 9.9 10.1  Total Protein 6.0 - 8.5 g/dL 7.1 7.1 7.4  Total Bilirubin 0.0 - 1.2 mg/dL 0.3 0.4 0.5  Alkaline Phos 39 - 117 IU/L 136(H) 121(H) 107  AST 0 - 40 IU/L 19 65(H) 35  ALT 0 - 32 IU/L 24 44(H) 33    Lab Results  Component Value Date   WBC 9.0 03/26/2018   HGB 12.4 03/26/2018   HCT 40.3 03/26/2018   MCV 81 03/26/2018   PLT 282.0 12/22/2017   NEUTROABS 6.9 03/26/2018    ASSESSMENT & PLAN:  Malignant neoplasm of upper-outer quadrant of left female breast ([place]) 06/21/16: Left lumpectomy: IDC grade 3, 1.1 cm, 1/5 LN Positive with ECE, Margins Neg, LVI Present; Er 30%, PR 0%, Ki 67 40%, Her 2 Positive Ratio 2.71; T1CN1 (Stage 2B)  Patient has multiple comorbidities including history of LVH with congestive heart failure:She has an echocardiogram coming up next month   Treatment Plan: 1. Adjuvant Taxol with Herceptin And Perjeta (Last echocardiogram showed an ejection fraction of 55-60%-- repeat on 4/3/2018EF 60-65%) completed 01/09/2017 2. Followed by adjuvant radiation 3. Followed by adjuvant antiestrogen therapy with anastrozole 1 mg daily 5 years (bone density 04/29/2016 T score -1.2) ----------------------------------------------------------------------------------------------------------------------------------------------------- Current Treatment:Anastrozole 1 mg daily started 04/29/2016 Anastrozole toxicities: Denies any hot flashes or myalgias  Surveillance: Mammogram 05/12/2017: Benign Breast exam: 04/12/19: Benign  Return to clinic in 1 year for follow-up    No orders of the defined types were placed in this encounter.  The patient has a good understanding of the overall plan. she agrees with it. she will call with any problems that may develop before the next visit here.  Nicholas Lose, MD 04/12/2019  Julious Oka Dorshimer am acting as scribe for Dr. Nicholas Lose.  I have reviewed the above documentation for accuracy and completeness, and I agree with the above.

## 2019-04-12 ENCOUNTER — Inpatient Hospital Stay: Payer: Medicare HMO | Attending: Hematology and Oncology | Admitting: Hematology and Oncology

## 2019-04-12 ENCOUNTER — Other Ambulatory Visit: Payer: Self-pay | Admitting: Family Medicine

## 2019-04-12 ENCOUNTER — Other Ambulatory Visit: Payer: Self-pay

## 2019-04-12 DIAGNOSIS — Z170 Estrogen receptor positive status [ER+]: Secondary | ICD-10-CM | POA: Diagnosis not present

## 2019-04-12 DIAGNOSIS — Z7984 Long term (current) use of oral hypoglycemic drugs: Secondary | ICD-10-CM | POA: Insufficient documentation

## 2019-04-12 DIAGNOSIS — Z923 Personal history of irradiation: Secondary | ICD-10-CM | POA: Diagnosis not present

## 2019-04-12 DIAGNOSIS — C50412 Malignant neoplasm of upper-outer quadrant of left female breast: Secondary | ICD-10-CM | POA: Diagnosis not present

## 2019-04-12 DIAGNOSIS — I509 Heart failure, unspecified: Secondary | ICD-10-CM | POA: Insufficient documentation

## 2019-04-12 DIAGNOSIS — Z79899 Other long term (current) drug therapy: Secondary | ICD-10-CM | POA: Diagnosis not present

## 2019-04-12 DIAGNOSIS — Z7982 Long term (current) use of aspirin: Secondary | ICD-10-CM | POA: Insufficient documentation

## 2019-04-12 DIAGNOSIS — R232 Flushing: Secondary | ICD-10-CM | POA: Insufficient documentation

## 2019-04-12 DIAGNOSIS — Z9221 Personal history of antineoplastic chemotherapy: Secondary | ICD-10-CM | POA: Insufficient documentation

## 2019-04-12 DIAGNOSIS — Z853 Personal history of malignant neoplasm of breast: Secondary | ICD-10-CM

## 2019-04-12 DIAGNOSIS — Z79811 Long term (current) use of aromatase inhibitors: Secondary | ICD-10-CM | POA: Insufficient documentation

## 2019-04-12 MED ORDER — ANASTROZOLE 1 MG PO TABS: 1.0000 mg | ORAL_TABLET | Freq: Every day | ORAL | 3 refills | Status: DC

## 2019-04-12 NOTE — Assessment & Plan Note (Signed)
06/21/16: Left lumpectomy: IDC grade 3, 1.1 cm, 1/5 LN Positive with ECE, Margins Neg, LVI Present; Er 30%, PR 0%, Ki 67 40%, Her 2 Positive Ratio 2.71; T1CN1 (Stage 2B)  Patient has multiple comorbidities including history of LVH with congestive heart failure:She has an echocardiogram coming up next month  Treatment Plan: 1. Adjuvant Taxol with Herceptin And Perjeta (Last echocardiogram showed an ejection fraction of 55-60%-- repeat on 4/3/2018EF 60-65%) completed 01/09/2017 2. Followed by adjuvant radiation 3. Followed by adjuvant antiestrogen therapy with anastrozole 1 mg daily 5 years (bone density 04/29/2016 T score -1.2) ----------------------------------------------------------------------------------------------------------------------------------------------------- Current Treatment:Anastrozole 1 mg daily started 04/29/2016 Anastrozole toxicities: Denies any hot flashes or myalgias  Surveillance: Mammogram 05/12/2017: Benign Breast exam: 04/12/19: Benign  I discussed with the patient that if she attributes the decreased appetite to anastrozole, to discontinue anastrozole and see how she feels.  If her symptoms improve then we can switch her to letrozole.  Return to clinic in 1 year for follow-up

## 2019-04-14 ENCOUNTER — Telehealth: Payer: Self-pay | Admitting: Hematology and Oncology

## 2019-04-14 NOTE — Telephone Encounter (Signed)
Left message re October 2021 visit. Schedule mailed.

## 2019-04-16 ENCOUNTER — Ambulatory Visit (INDEPENDENT_AMBULATORY_CARE_PROVIDER_SITE_OTHER): Payer: Medicare HMO | Admitting: Family Medicine

## 2019-04-16 ENCOUNTER — Encounter: Payer: Self-pay | Admitting: Family Medicine

## 2019-04-16 ENCOUNTER — Other Ambulatory Visit: Payer: Self-pay

## 2019-04-16 VITALS — BP 130/91 | HR 98 | Temp 98.3°F | Ht 65.0 in | Wt 275.0 lb

## 2019-04-16 DIAGNOSIS — J0140 Acute pansinusitis, unspecified: Secondary | ICD-10-CM

## 2019-04-16 DIAGNOSIS — I10 Essential (primary) hypertension: Secondary | ICD-10-CM | POA: Diagnosis not present

## 2019-04-16 MED ORDER — LEVOFLOXACIN 500 MG PO TABS: 500.0000 mg | ORAL_TABLET | Freq: Every day | ORAL | 0 refills | Status: DC

## 2019-04-16 NOTE — Progress Notes (Signed)
Virtual Visit via Video Note  I connected with [name] on 04/16/19 at 11:20 AM EDT by a video enabled telemedicine application and verified that I am speaking with the correct person using two identifiers.  Location: Patient: home  Provider: office   I discussed the limitations of evaluation and management by telemedicine and the availability of in person appointments. The patient expressed understanding and agreed to proceed.  History of Present Illness: Pt is taking claritin and flonase and  finished augmentin -- she felt better for a few days but then symptoms came  She is still using her nose spray and antihistamine.  No fevers   She also c/o light headed feeling since starting coreg and her bp has been running high.  No cp, no sob no palpitations  bp running 130s/ 90s and pulse running high as well    Observations/Objective: Vitals:   04/16/19 1123  BP: (!) 130/91  Pulse: 98  Temp: 98.3 F (36.8 C)  pt is in nad No sob   Assessment and Plan: 1. Acute non-recurrent pansinusitis flonase and antihistamine con't  Add abx and covid test - levofloxacin (LEVAQUIN) 500 MG tablet; Take 1 tablet (500 mg total) by mouth daily.  Dispense: 10 tablet; Refill: 0  2. Essential hypertension Poorly controlled will alter medications, encouraged DASH diet, minimize caffeine and obtain adequate sleep. Report concerning symptoms and follow up as directed and as needed Inc coreg to 12.5 bid F/u pcp in 2 weeks -- sooner prn  If symptoms worsen with inc in med-- call office     Follow Up Instructions:    I discussed the assessment and treatment plan with the patient. The patient was provided an opportunity to ask questions and all were answered. The patient agreed with the plan and demonstrated an understanding of the instructions.   The patient was advised to call back or seek an in-person evaluation if the symptoms worsen or if the condition fails to improve as  anticipated.   Ann Held, DO

## 2019-04-16 NOTE — Assessment & Plan Note (Signed)
Poorly controlled will alter medications, encouraged DASH diet, minimize caffeine and obtain adequate sleep. Report concerning symptoms and follow up as directed and as needed Inc coreg to 12.5 bid F/u pcp in 2 weeks -- sooner prn  If symptoms worsen with inc in med-- call office

## 2019-04-21 ENCOUNTER — Other Ambulatory Visit: Payer: Self-pay

## 2019-04-21 ENCOUNTER — Encounter: Payer: Self-pay | Admitting: Family Medicine

## 2019-04-21 ENCOUNTER — Ambulatory Visit (INDEPENDENT_AMBULATORY_CARE_PROVIDER_SITE_OTHER): Payer: Medicare HMO

## 2019-04-21 ENCOUNTER — Other Ambulatory Visit: Payer: Self-pay | Admitting: Family Medicine

## 2019-04-21 DIAGNOSIS — Z23 Encounter for immunization: Secondary | ICD-10-CM

## 2019-04-21 MED ORDER — ZOSTER VAC RECOMB ADJUVANTED 50 MCG/0.5ML IM SUSR: 0.5000 mL | Freq: Once | INTRAMUSCULAR | 1 refills | Status: AC

## 2019-04-22 DIAGNOSIS — G4733 Obstructive sleep apnea (adult) (pediatric): Secondary | ICD-10-CM | POA: Diagnosis not present

## 2019-04-22 DIAGNOSIS — Z96651 Presence of right artificial knee joint: Secondary | ICD-10-CM | POA: Diagnosis not present

## 2019-05-06 ENCOUNTER — Other Ambulatory Visit: Payer: Self-pay | Admitting: Family Medicine

## 2019-05-10 ENCOUNTER — Other Ambulatory Visit: Payer: Self-pay | Admitting: Family Medicine

## 2019-05-14 ENCOUNTER — Ambulatory Visit: Payer: Self-pay | Admitting: Internal Medicine

## 2019-05-17 ENCOUNTER — Ambulatory Visit
Admission: RE | Admit: 2019-05-17 | Discharge: 2019-05-17 | Disposition: A | Discharge status: Home / Self Care | Payer: Medicare HMO | Source: Ambulatory Visit | Attending: Family Medicine | Admitting: Family Medicine

## 2019-05-17 ENCOUNTER — Other Ambulatory Visit: Payer: Self-pay

## 2019-05-17 DIAGNOSIS — Z853 Personal history of malignant neoplasm of breast: Secondary | ICD-10-CM

## 2019-05-17 DIAGNOSIS — R928 Other abnormal and inconclusive findings on diagnostic imaging of breast: Secondary | ICD-10-CM | POA: Diagnosis not present

## 2019-05-19 ENCOUNTER — Other Ambulatory Visit: Payer: Self-pay | Admitting: Family Medicine

## 2019-05-23 DIAGNOSIS — G4733 Obstructive sleep apnea (adult) (pediatric): Secondary | ICD-10-CM | POA: Diagnosis not present

## 2019-05-23 DIAGNOSIS — Z96651 Presence of right artificial knee joint: Secondary | ICD-10-CM | POA: Diagnosis not present

## 2019-05-26 ENCOUNTER — Other Ambulatory Visit: Payer: Self-pay

## 2019-05-26 DIAGNOSIS — G4733 Obstructive sleep apnea (adult) (pediatric): Secondary | ICD-10-CM | POA: Diagnosis not present

## 2019-06-13 ENCOUNTER — Encounter: Payer: Self-pay | Admitting: Internal Medicine

## 2019-06-15 ENCOUNTER — Ambulatory Visit (INDEPENDENT_AMBULATORY_CARE_PROVIDER_SITE_OTHER): Payer: Medicare HMO | Admitting: Internal Medicine

## 2019-06-15 ENCOUNTER — Other Ambulatory Visit: Payer: Self-pay

## 2019-06-15 ENCOUNTER — Encounter: Payer: Self-pay | Admitting: Internal Medicine

## 2019-06-15 DIAGNOSIS — G4733 Obstructive sleep apnea (adult) (pediatric): Secondary | ICD-10-CM

## 2019-06-15 DIAGNOSIS — J300 Vasomotor rhinitis: Secondary | ICD-10-CM

## 2019-06-15 DIAGNOSIS — J4520 Mild intermittent asthma, uncomplicated: Secondary | ICD-10-CM | POA: Diagnosis not present

## 2019-06-15 DIAGNOSIS — J329 Chronic sinusitis, unspecified: Secondary | ICD-10-CM | POA: Diagnosis not present

## 2019-06-15 NOTE — Progress Notes (Signed)
[name] never smoker followed for OSA, lung restriction, dyspnea on exertion, asthmatic bronchitis, complicated by chronic sinusitis, anxiety and depression, paroxysmal atrial tachycardia, CAD, breast cancer left, morbid obesity Unattended Home Sleep Test 11/05/13-AHI 68.1/hour, desaturation to 62%, body weight 320 pounds Echocardiogram 12/04/16- Nl EF Office Spirometry 02/06/17-moderate restriction of exhaled volume. FVC 2.02/61%, FEV1 1.75/69%, ratio 0.86, FEF 25-75% 2.45/113% PFT- 03/12/17-moderate obstructive airways disease with significant response to bronchodilator, normal diffusion. FVC 2.25/69%, FEV1 1.88/76%, ratio 0.84, TLC 80%, DLCO 86% --------------------------------------------------------------------------------------------.  11/21/2017- 68 year old [name] never smoker followed for OSA, lung restriction, dyspnea on exertion, asthmatic bronchitis, complicated by chronic sinusitis, DM2, anxiety and depression, paroxysmal atrial tachycardia, CAD, breast cancer left, morbid obesity, goiter,  CPAP auto 5-15/Advanced -----OSA: DME: [facility] Pt wears CPAP nightly and DL attached. Send order for new supplies.  Body weight 308 pounds Breo 100,  Singulair, albuterol HFA, Flonase, She thought Trelegy burned her throat and prefers Breo.  Has been breathing better with decreased wheeze since finishing cancer treatment 2 years ago.  Had chemo/XRT. Happy with her CPAP.  Download 100% compliance AHI 0.9/hour. Weight loss program-Down 18 pounds so far.  06/15/2019- Virtual Visit via Telephone Note  I connected with [name] on 06/15/19 at  9:00 AM EST by telephone and verified that I am speaking with the correct person using two identifiers.  Location: Patient: H Provider: O   I discussed the limitations, risks, security and privacy concerns of performing an evaluation and management service by telephone and the availability of in person appointments. I also discussed with the patient  that there may be a patient responsible charge related to this service. The patient expressed understanding and agreed to proceed.   History of Present Illness: 68 year old [name] never smoker followed for OSA, lung restriction, dyspnea on exertion, asthmatic bronchitis, complicated by chronic sinusitis, anxiety and depression, paroxysmal atrial tachycardia, CAD, breast cancer left, morbid obesity, goiter,  CPAP auto 5-15/[employer] Download compliance 100%, AHI 0.4/ hr Albuterol hfa, Breo 100, singulair Had flu vax Hasn't needed rescue hfa in over a month. Not using Breo. Chest feels ok. Admits some nasal congestion.   Observations/Objective:   Assessment and Plan: Asthmatic Bronchitis- mild intermittent OSA Continue 5-15 Rhinosinusitis- Flonase  Follow Up Instructions: 6 months   I discussed the assessment and treatment plan with the patient. The patient was provided an opportunity to ask questions and all were answered. The patient agreed with the plan and demonstrated an understanding of the instructions.   The patient was advised to call back or seek an in-person evaluation if the symptoms worsen or if the condition fails to improve as anticipated.  I provided 21 minutes of non-face-to-face time during this encounter.   Baird Lyons, MD    ROS-see [name]   + = Positive Constitutional:    +weight loss, night sweats, fevers, chills, fatigue, lassitude. HEENT:   + headaches, difficulty swallowing, tooth/dental problems, sore throat,       sneezing, itching, ear ache, + nasal congestion, post nasal drip, snoring CV:    chest pain, orthopnea, PND, swelling in lower extremities, anasarca,                                                  dizziness, palpitations Resp:   + shortness of breath with exertion or at rest.  productive cough,   non-productive cough, coughing up of blood.              change in color of mucus.  + wheezing.   Skin:    rash or lesions. GI:  No-    heartburn, indigestion, abdominal pain, nausea, vomiting, diarrhea,                 change in bowel habits, loss of appetite GU: dysuria, change in color of urine, no urgency or frequency.   flank pain. MS:   joint pain, stiffness, decreased range of motion, back pain. Neuro-     nothing unusual Psych:  change in mood or affect.  depression or anxiety.   memory loss.  OBJ- Physical Exam General- Alert, Oriented, Affect-appropriate, Distress- none acute, + morbidly obese Skin- rash-none, lesions- none, excoriation- none Lymphadenopathy- none Head- atraumatic,             Eyes- Gross vision intact, PERRLA, conjunctivae and secretions clear            Ears- Hearing, canals-normal            Nose-  turbinate edema, no-Septal dev, mucus, polyps, erosion, perforation,+ less inspiratory stridor than in past.            Throat- Mallampati III , mucosa clear , drainage- none, tonsils- atrophic Neck- flexible , trachea midline , thyroid R mass identified as" goiter" , carotid no bruit Chest - symmetrical excursion , unlabored           Heart/CV- RRR , no murmur , no gallop  , no rub, nl s1 s2                           - JVD- none , edema- none, stasis changes- none, varices- none           Lung- clear,  wheeze- none, cough- none , dullness-none, rub- none           Chest wall-  Abd-  Br/ Gen/ Rectal- Not done, not indicated Extrem- cyanosis- none, clubbing, none, atrophy- none, strength- nl Neuro- grossly intact to observation

## 2019-06-15 NOTE — Patient Instructions (Signed)
Refill scripts sent for Breo and your albuterol rescue inhaler  We suggested trying flonase nasal spray, 2 puffs each nostril once daily at bedtime Try this for a week or so and see if it begins to help your stuffy/ watery nose. If it doesn't help, please let us know.  Please call if we can help

## 2019-06-22 DIAGNOSIS — Z96651 Presence of right artificial knee joint: Secondary | ICD-10-CM | POA: Diagnosis not present

## 2019-06-22 DIAGNOSIS — G4733 Obstructive sleep apnea (adult) (pediatric): Secondary | ICD-10-CM | POA: Diagnosis not present

## 2019-06-25 HISTORY — PX: REPAIR DURAL / CSF LEAK: SUR1169

## 2019-06-29 ENCOUNTER — Other Ambulatory Visit: Payer: Self-pay | Admitting: Internal Medicine

## 2019-07-02 ENCOUNTER — Other Ambulatory Visit: Payer: Self-pay

## 2019-07-02 ENCOUNTER — Telehealth (INDEPENDENT_AMBULATORY_CARE_PROVIDER_SITE_OTHER): Payer: Medicare Other | Admitting: Cardiology

## 2019-07-02 ENCOUNTER — Encounter: Payer: Self-pay | Admitting: Cardiology

## 2019-07-02 VITALS — Ht 65.0 in | Wt 280.0 lb

## 2019-07-02 DIAGNOSIS — E7800 Pure hypercholesterolemia, unspecified: Secondary | ICD-10-CM

## 2019-07-02 DIAGNOSIS — I10 Essential (primary) hypertension: Secondary | ICD-10-CM

## 2019-07-02 DIAGNOSIS — I471 Supraventricular tachycardia: Secondary | ICD-10-CM | POA: Diagnosis not present

## 2019-07-02 DIAGNOSIS — I2510 Atherosclerotic heart disease of native coronary artery without angina pectoris: Secondary | ICD-10-CM

## 2019-07-02 DIAGNOSIS — I77810 Thoracic aortic ectasia: Secondary | ICD-10-CM

## 2019-07-02 NOTE — Patient Instructions (Signed)
Medication Instructions:  Your physician recommends that you continue on your current medications as directed. Please refer to the Current Medication list given to you today.  *If you need a refill on your cardiac medications before your next appointment, please call your pharmacy*  Lab Work: CMET and fasting lipid panel.   If you have labs (blood work) drawn today and your tests are completely normal, you will receive your results only by: Marland Kitchen MyChart Message (if you have MyChart) OR . A paper copy in the mail If you have any lab test that is abnormal or we need to change your treatment, we will call you to review the results.  Testing/Procedures: Your physician has requested that you have an echocardiogram. Echocardiography is a painless test that uses sound waves to create images of your heart. It provides your doctor with information about the size and shape of your heart and how well your heart's chambers and valves are working. This procedure takes approximately one hour. There are no restrictions for this procedure.  Follow-Up: At [facility], you and your health needs are our priority.  As part of our continuing mission to provide you with exceptional heart care, we have created designated Provider Care Teams.  These Care Teams include your primary Cardiologist (physician) and Advanced Practice Providers (APPs -  Physician Assistants and Nurse Practitioners) who all work together to provide you with the care you need, when you need it.  Your next appointment:   1 year(s)  The format for your next appointment:   In Person  Provider:   Fransico Him, MD

## 2019-07-02 NOTE — Patient Outreach (Signed)
[facility]) Care Management  [place]  07/02/2019   Murry Citizen 09-Oct-1950 [id number]  Subjective: Telephone call to patient for quarterly call. Patient reports that she is doing ok.  Still dealing with sinus issues but they are better for now as she is using Flonase regularly.  She reports that her sugars are doing good with last check of 107.  Patient admits to not exercising regularly. Encouraged patient to get back on her exercise regimen and how blood sugar control and exercise go hand in hand.  She verbalized understanding and denies any needs.    Objective:   Encounter Medications:  Outpatient Encounter Medications as of 07/02/2019  Medication Sig  . acetaminophen (TYLENOL) 500 MG tablet Take 1,000 mg by mouth 2 (two) times daily as needed for moderate pain or headache.  . anastrozole (ARIMIDEX) 1 MG tablet Take 1 tablet (1 mg total) by mouth daily.  Marland Kitchen aspirin EC 81 MG tablet Take 81 mg by mouth at bedtime.   Marland Kitchen BREO ELLIPTA 100-25 MCG/INH AEPB INHALE 1 PUFF INTO LUNGS ONCE A DAY  . carvedilol (COREG) 6.25 MG tablet TAKE 1 TABLET TWICE DAILY  . diclofenac (VOLTAREN) 75 MG EC tablet TAKE 1 TABLET TWICE DAILY  . diltiazem (CARDIZEM CD) 120 MG 24 hr capsule TAKE 1 CAPSULE EVERY DAY  . escitalopram (LEXAPRO) 20 MG tablet TAKE 1 TABLET DAILY  . esomeprazole (NEXIUM) 40 MG capsule TAKE 1 CAPSULE EVERY DAY  . ezetimibe (ZETIA) 10 MG tablet TAKE 1 TABLET EVERY DAY  . fluticasone (FLONASE) 50 MCG/ACT nasal spray Place 2 sprays into both nostrils daily.  . furosemide (LASIX) 20 MG tablet TAKE 1 TABLET EVERY DAY  . glucose blood (ACCU-CHEK AVIVA) test strip Twice daily (please use brand that patient insurance covers)  . Hypromellose (ARTIFICIAL TEARS OP) Place 1 drop into both eyes daily as needed (dry eyes).   Marland Kitchen loratadine (CLARITIN) 10 MG tablet Take 1 tablet (10 mg total) by mouth daily.  Marland Kitchen losartan (COZAAR) 100 MG tablet TAKE 1 TABLET EVERY DAY  .  metFORMIN (GLUCOPHAGE) 500 MG tablet TAKE 1 TABLET TWICE DAILY WITH MEALS  . montelukast (SINGULAIR) 10 MG tablet TAKE 1 TABLET (10 MG TOTAL) BY MOUTH AT BEDTIME.  . rosuvastatin (CRESTOR) 40 MG tablet TAKE 1 TABLET EVERY DAY  . VENTOLIN HFA 108 (90 Base) MCG/ACT inhaler INHALE 2 PUFFS INTO THE LUNGS EVERY 6 HOURS AS NEEDED FOR WHEEZING INTO RECTUM SHORTNESS OF BREATH   No facility-administered encounter medications on file as of 07/02/2019.    Functional Status:  In your present state of health, do you have any difficulty performing the following activities: 04/02/2019 09/18/2018  Hearing? N N  Vision? N N  Difficulty concentrating or making decisions? N N  Walking or climbing stairs? Y N  Comment knee problems -  Dressing or bathing? N N  Doing errands, shopping? N N  Preparing Food and eating ? N N  Using the Toilet? N N  In the past six months, have you accidently leaked urine? Y N  Comment some incontinence at times -  Do you have problems with loss of bowel control? N N  Managing your Medications? N N  Managing your Finances? N N  Housekeeping or managing your Housekeeping? N N  Some recent data might be hidden    Fall/Depression Screening: Fall Risk  04/02/2019 12/24/2018 09/23/2018  Falls in the past year? 0 0 1  Number falls in past yr: 0 -  0  Injury with Fall? - - 0  Comment - - patient stated that she bruised her knees  Risk for fall due to : - - Other (Comment)  Risk for fall due to: Comment - - Patient states that it was just a "weird accident" she tripped over her feet.  Follow up - - Education provided   [facility] 2/9 Scores 04/02/2019 09/18/2018 08/13/2018 07/16/2018 05/27/2018 05/07/2018 04/22/2018  PHQ - 2 Score 0 0 0 2 0 0 2  PHQ- 9 Score - - 2 5 0 1 4  Exception Documentation - - - - - - -    Assessment: Patient continues to benefit from disease management support.  Plan:  [facility] CM Care Plan Problem One     Most Recent Value  Care Plan Problem One  Knowledge deficit  related to diabetes managment  Role Documenting the Problem One  Care Management Telephonic Coordinator  Care Plan for Problem One  Active  [facility] Long Term Goal   In 90 days the patient will maintain an a1c of 6.3.  [facility]N Long Term Goal Start Date  07/02/19  Interventions for Problem One Long Term Goal  Patient continues to check sugars regularly.  Encouraged patient to exercise as much as possible.       RN CM will contact patient again in the month of April and patient agreeable.    Jone Baseman, RN, MSN [place] Management Care Management Coordinator Direct Line [phone] Cell [phone] Toll Free: [phone]  Fax: [phone]

## 2019-07-02 NOTE — Progress Notes (Signed)
Virtual Visit via Telephone Note   This visit type was conducted due to national recommendations for restrictions regarding the COVID-19 Pandemic (e.g. social distancing) in an effort to limit this patient's exposure and mitigate transmission in our community.  Due to her co-morbid illnesses, this patient is at least at moderate risk for complications without adequate follow up.  This format is felt to be most appropriate for this patient at this time.  The patient did not have access to video technology/had technical difficulties with video requiring transitioning to audio format only (telephone).  All issues noted in this document were discussed and addressed.  No physical exam could be performed with this format.  Please refer to the patient's chart for her  consent to telehealth for [facility].   Evaluation Performed:  Follow-up visit  This visit type was conducted due to national recommendations for restrictions regarding the COVID-19 Pandemic (e.g. social distancing).  This format is felt to be most appropriate for this patient at this time.  All issues noted in this document were discussed and addressed.  No physical exam was performed (except for noted visual exam findings with Video Visits).  Please refer to the patient's chart (MyChart message for video visits and phone note for telephone visits) for the patient's consent to telehealth for [facility].  Date:  07/02/2019   ID:  [name], DOB Jan 25, 1951, MRN [id number]  Patient Location:  Home  Provider location:   [place]  PCP:  [name], MD  Cardiologist:  Fransico Him, MD  Electrophysiologist:  None   Chief Complaint:  CHF, CAD, HTN, HLD  History of Present Illness:    [name] is a 69 y.o. female who presents via audio/video conferencing for a telehealth visit today.    [name] is a 69 y.o. female with a hx of HTN, chronic diastolic CHF, nonsustained atrial tachycardia, Cath  with nonobstructive ASCAD with 20% mid LAD, 20% prox left circ and 20% ostial RCA with elevated LVEDP and dyslipidemia.  She has just finished all her chemo and XRT for breast CA.  She is here today for followup and is doing well.  Shd denies any chest pain or pressure, SOB, DOE, PND, orthopnea, LE edema, dizziness (except occasional vertigo), palpitations or syncope. She is compliant with her meds and is tolerating meds with no SE.    The patient does not have symptoms concerning for COVID-19 infection (fever, chills, cough, or new shortness of breath).   Prior CV studies:   The following studies were reviewed today:  none  Past Medical History:  Diagnosis Date  . Anemia 04/05/2012  . Anxiety   . Anxiety state 09/11/2007   Qualifier: Diagnosis of  By: Scherrie Gerlach    . Asthma   . Atrial tachycardia, paroxysmal ([place])   . Back pain 10/02/2014  . Breast cancer ([place]) 02/17/2017  . Chicken pox as a child  . Chronic diastolic CHF (congestive heart failure), NYHA class 1 ([place])   . Complication of anesthesia    pt states feels different in her body after anesthesia when waking up and also experiences a smell of burnt plastic for approx a wk   . Constipation   . Depression   . Diabetes ([place])   . Dilated aortic root ([place])    16mm by echo 09/2016  . Dysuria 02/17/2017  . Elevated LFTs 04/05/2012  . GERD (gastroesophageal reflux disease)   . Goiter   . Heart murmur  hx of one at birth   . History of kidney stones   . History of radiation therapy 10/31/16-12/17/16   left breast 45 Gy in 25 fractions, left breast boost 16 Gy in 8 fractions  . Hx of colonic polyps   . Hyperlipidemia   . Hypertension   . Insomnia 10/08/2016  . Joint pain   . Malignant neoplasm of upper-outer quadrant of left female breast ([place]) 05/14/2016   not with patient  . Measles as a child  . Mumps as a child  . OA (osteoarthritis) of knee 04/05/2012  . Obesity 07/11/2014  . PONV (postoperative nausea and  vomiting)   . Preventative health care 09/05/2013  . PVC's (premature ventricular contractions) 05/02/2016  . Shortness of breath dyspnea    walking distances / climbing stairs  . Sleep apnea 04/05/2012  . Tinea corporis 02/23/2013  . Type 2 diabetes mellitus with hyperglycemia ([place]) 12/31/2013  . Vasomotor rhinitis 04/05/2012  . Ventral hernia   . Wheezing    Past Surgical History:  Procedure Laterality Date  . BREAST LUMPECTOMY Left 2017  . BREAST LUMPECTOMY WITH RADIOACTIVE SEED AND SENTINEL LYMPH NODE BIOPSY Left 06/21/2016   Procedure: LEFT BREAST LUMPECTOMY WITH RADIOACTIVE SEED AND SENTINEL LYMPH NODE BIOPSY, INJECT BLUE DYE LEFT BREAST;  Surgeon: Fanny Skates, MD;  Location: [place];  Service: General;  Laterality: Left;  . CARDIAC CATHETERIZATION     normal coroary arteries per patient  . CARDIOVASCULAR STRESS TEST     10/12/2013  . CESAREAN SECTION     X 3  . CHOLECYSTECTOMY    . COLONOSCOPY WITH PROPOFOL N/A 03/28/2015   Procedure: COLONOSCOPY WITH PROPOFOL;  Surgeon: Juanita Craver, MD;  Location: [facility];  Service: Endoscopy;  Laterality: N/A;  . HERNIA REPAIR  02/08/11   ventral hernia  . JOINT REPLACEMENT     bilateral  . KNEE ARTHROSCOPY  05/2010   bilateral  . LEFT AND RIGHT HEART CATHETERIZATION WITH CORONARY ANGIOGRAM N/A 11/08/2013   Procedure: LEFT AND RIGHT HEART CATHETERIZATION WITH CORONARY ANGIOGRAM;  Surgeon: [name], MD;  Location: [facility] CATH LAB;  Service: Cardiovascular;  Laterality: N/A;  . MENISCUS REPAIR  2009  . MOUTH SURGERY     teeth implants  . PORT-A-CATH REMOVAL N/A 01/24/2017   Procedure: REMOVAL PORT-A-CATH;  Surgeon: Fanny Skates, MD;  Location: [place];  Service: General;  Laterality: N/A;  . PORTACATH PLACEMENT Right 07/16/2016   Procedure: INSERTION PORT-A-CATH RIGHT INTERNAL JUGULAR WITH ULTRASOUND;  Surgeon: Fanny Skates, MD;  Location: [name];  Service: General;  Laterality: Right;  . TOTAL KNEE ARTHROPLASTY  2011   left  .  TOTAL KNEE ARTHROPLASTY Right 05/10/2014   Procedure: RIGHT TOTAL KNEE ARTHROPLASTY;  Surgeon: Mauri Pole, MD;  Location: [facility];  Service: Orthopedics;  Laterality: Right;  . TUBAL LIGATION    . WISDOM TOOTH EXTRACTION  2000     Current Meds  Medication Sig  . acetaminophen (TYLENOL) 500 MG tablet Take 1,000 mg by mouth 2 (two) times daily as needed for moderate pain or headache.  . anastrozole (ARIMIDEX) 1 MG tablet Take 1 tablet (1 mg total) by mouth daily.  Marland Kitchen aspirin EC 81 MG tablet Take 81 mg by mouth at bedtime.   Marland Kitchen BREO ELLIPTA 100-25 MCG/INH AEPB INHALE 1 PUFF INTO LUNGS ONCE A DAY  . carvedilol (COREG) 6.25 MG tablet TAKE 1 TABLET TWICE DAILY  . diclofenac (VOLTAREN) 75 MG EC tablet TAKE 1 TABLET TWICE DAILY  .  diltiazem (CARDIZEM CD) 120 MG 24 hr capsule TAKE 1 CAPSULE EVERY DAY  . escitalopram (LEXAPRO) 20 MG tablet TAKE 1 TABLET DAILY  . esomeprazole (NEXIUM) 40 MG capsule TAKE 1 CAPSULE EVERY DAY  . ezetimibe (ZETIA) 10 MG tablet TAKE 1 TABLET EVERY DAY  . fluticasone (FLONASE) 50 MCG/ACT nasal spray Place 2 sprays into both nostrils daily.  . furosemide (LASIX) 20 MG tablet TAKE 1 TABLET EVERY DAY  . glucose blood (ACCU-CHEK AVIVA) test strip Twice daily (please use brand that patient insurance covers)  . Hypromellose (ARTIFICIAL TEARS OP) Place 1 drop into both eyes daily as needed (dry eyes).   Marland Kitchen loratadine (CLARITIN) 10 MG tablet Take 1 tablet (10 mg total) by mouth daily.  Marland Kitchen losartan (COZAAR) 100 MG tablet TAKE 1 TABLET EVERY DAY  . metFORMIN (GLUCOPHAGE) 500 MG tablet TAKE 1 TABLET TWICE DAILY WITH MEALS  . montelukast (SINGULAIR) 10 MG tablet TAKE 1 TABLET (10 MG TOTAL) BY MOUTH AT BEDTIME.  . rosuvastatin (CRESTOR) 40 MG tablet TAKE 1 TABLET EVERY DAY  . VENTOLIN HFA 108 (90 Base) MCG/ACT inhaler INHALE 2 PUFFS INTO THE LUNGS EVERY 6 HOURS AS NEEDED FOR WHEEZING INTO RECTUM SHORTNESS OF BREATH     Allergies:   No known allergies   Social History   Tobacco  Use  . Smoking status: Never Smoker  . Smokeless tobacco: Never Used  Substance Use Topics  . Alcohol use: Not Currently    Comment: rarely  . Drug use: No     Family Hx: The patient's family history includes Alcohol abuse in her paternal grandfather; Anxiety disorder in her mother; Arthritis in her father; Cancer in her maternal aunt and maternal grandmother; Coronary artery disease in her brother and father; Depression in her mother; Heart attack in her maternal grandfather; Heart attack (age of onset: 34) in her father; Heart disease in her brother; High Cholesterol in her father; Hyperlipidemia in her mother; Hypertension in her father; Thyroid disease in her mother.  ROS:   Please see the history of present illness.     All other systems reviewed and are negative.   Labs/Other Tests and Data Reviewed:    Recent Labs: 07/02/2018: ALT 24; BUN 24; Creatinine, Ser 0.80; Potassium 5.0; Sodium 142   Recent Lipid Panel Lab Results  Component Value Date/Time   CHOL 126 03/26/2018 10:24 AM   TRIG 188 (H) 03/26/2018 10:24 AM   HDL 34 (L) 03/26/2018 10:24 AM   CHOLHDL 3 12/22/2017 09:07 AM   LDLCALC 54 03/26/2018 10:24 AM   LDLDIRECT 108.0 10/08/2016 08:49 AM    Wt Readings from Last 3 Encounters:  07/02/19 280 lb (127 kg)  04/16/19 275 lb (124.7 kg)  04/12/19 281 lb 8 oz (127.7 kg)     Objective:    Vital Signs:  Ht 5\' 5"  (1.651 m)   Wt 280 lb (127 kg)   BMI 46.59 kg/m     ASSESSMENT & PLAN:    1.  ASCAD -remote Cath with nonobstructive ASCAD with 20% mid LAD, 20% prox left circ and 20% ostial RCA with elevated LVEDP -denies any anginal sx -continue ASA, statin and BB  2.  Chronic diastolic CHF -weight is stable -denies any DOE or LE edema -Cardiac MRI 01/2017 with normal LVF EF 65% -continue on Lasix 20mg  daily  3.  HTN  -BP controlled -continue Carvedilol 6.25mg  BID, Cartia XT 120mg  daily and Losartan 100mg  daily  4.  Paroxysmal atrial tachycardia -she has  not  had any palpitations -continue Carvedilol 6.25mg  BID and Cartia XT 120mg  daily  5.  Dilated aortic root -noted to be 4.1cm on Chest CT in 2017 and 65mm by echo in 2019 -repeat 2D echo to assess for stability  6.  HLD -LDL goal < 70 -continue Crestor 40mg  daily and Zetia 10mg  daily -check CMET and FLP  COVID-19 Education: The signs and symptoms of COVID-19 were discussed with the patient and how to seek care for testing (follow up with PCP or arrange E-visit).  The importance of social distancing was discussed today.  Patient Risk:   After full review of this patient's clinical status, I feel that they are at least moderate risk at this time.  Time:   Today, I have spent 20 minutes  on telemedicine discussing medical problems including CAD, HTN, HLD, CHF, PAT.  We also reviewed the symptoms of COVID 19 and the ways to protect against contracting the virus with telehealth technology.  I spent an additional 5 minutes reviewing patient's chart including labs.  Medication Adjustments/Labs and Tests Ordered: Current medicines are reviewed at length with the patient today.  Concerns regarding medicines are outlined above.  Tests Ordered: No orders of the defined types were placed in this encounter.  Medication Changes: No orders of the defined types were placed in this encounter.   Disposition:  Follow up in 1 year(s)  Signed, Fransico Him, MD  07/02/2019 10:45 AM    [facility]

## 2019-07-13 DIAGNOSIS — E119 Type 2 diabetes mellitus without complications: Secondary | ICD-10-CM | POA: Diagnosis not present

## 2019-07-13 LAB — HM DIABETES EYE EXAM

## 2019-07-14 ENCOUNTER — Ambulatory Visit (HOSPITAL_COMMUNITY): Payer: Medicare Other | Attending: Cardiovascular Disease

## 2019-07-14 ENCOUNTER — Other Ambulatory Visit: Payer: Self-pay

## 2019-07-14 ENCOUNTER — Encounter: Payer: Self-pay | Admitting: Cardiology

## 2019-07-14 ENCOUNTER — Other Ambulatory Visit: Payer: Medicare Other | Admitting: *Deleted

## 2019-07-14 DIAGNOSIS — I471 Supraventricular tachycardia: Secondary | ICD-10-CM

## 2019-07-14 DIAGNOSIS — E7800 Pure hypercholesterolemia, unspecified: Secondary | ICD-10-CM | POA: Diagnosis not present

## 2019-07-14 DIAGNOSIS — I10 Essential (primary) hypertension: Secondary | ICD-10-CM | POA: Diagnosis not present

## 2019-07-14 DIAGNOSIS — I77810 Thoracic aortic ectasia: Secondary | ICD-10-CM

## 2019-07-14 DIAGNOSIS — I2510 Atherosclerotic heart disease of native coronary artery without angina pectoris: Secondary | ICD-10-CM

## 2019-07-14 LAB — COMPREHENSIVE METABOLIC PANEL
ALT: 23 IU/L (ref 0–32)
AST: 22 IU/L (ref 0–40)
Albumin/Globulin Ratio: 1.7 (ref 1.2–2.2)
Albumin: 4.5 g/dL (ref 3.8–4.8)
Alkaline Phosphatase: 120 IU/L — ABNORMAL HIGH (ref 39–117)
BUN/Creatinine Ratio: 23 (ref 12–28)
BUN: 18 mg/dL (ref 8–27)
Bilirubin Total: 0.4 mg/dL (ref 0.0–1.2)
CO2: 21 mmol/L (ref 20–29)
Calcium: 10 mg/dL (ref 8.7–10.3)
Chloride: 102 mmol/L (ref 96–106)
Creatinine, Ser: 0.79 mg/dL (ref 0.57–1.00)
GFR calc Af Amer: 89 mL/min/{1.73_m2} (ref >59)
GFR calc non Af Amer: 77 mL/min/{1.73_m2} (ref >59)
Globulin, Total: 2.7 g/dL (ref 1.5–4.5)
Glucose: 134 mg/dL — ABNORMAL HIGH (ref 65–99)
Potassium: 4.4 mmol/L (ref 3.5–5.2)
Sodium: 141 mmol/L (ref 134–144)
Total Protein: 7.2 g/dL (ref 6.0–8.5)

## 2019-07-14 LAB — LIPID PANEL
Chol/HDL Ratio: 3.1 ratio (ref 0.0–4.4)
Cholesterol, Total: 123 mg/dL (ref 100–199)
HDL: 40 mg/dL (ref >39)
LDL Chol Calc (NIH): 54 mg/dL (ref 0–99)
Triglycerides: 177 mg/dL — ABNORMAL HIGH (ref 0–149)
VLDL Cholesterol Cal: 29 mg/dL (ref 5–40)

## 2019-07-15 ENCOUNTER — Telehealth: Payer: Self-pay

## 2019-07-15 DIAGNOSIS — I77810 Thoracic aortic ectasia: Secondary | ICD-10-CM

## 2019-07-15 MED ORDER — ICOSAPENT ETHYL 1 G PO CAPS: 2.0000 g | ORAL_CAPSULE | Freq: Two times a day (BID) | ORAL | 3 refills | Status: DC

## 2019-07-15 NOTE — Telephone Encounter (Signed)
-----   Message from Sueanne Margarita, MD sent at 07/14/2019  3:49 PM EST ----- Echo showed low normal heart function with mildly thickened and mildly enlarged left sided pumping chamber.  The Left atrium is enlarged.  Mildly dilated aortic root.  No significant change from echo 2019. Please repeat in 1 year to followup on dilated aortic root

## 2019-07-23 DIAGNOSIS — G4733 Obstructive sleep apnea (adult) (pediatric): Secondary | ICD-10-CM | POA: Diagnosis not present

## 2019-07-23 DIAGNOSIS — Z96651 Presence of right artificial knee joint: Secondary | ICD-10-CM | POA: Diagnosis not present

## 2019-08-02 ENCOUNTER — Encounter: Payer: Self-pay | Admitting: Internal Medicine

## 2019-08-02 NOTE — Assessment & Plan Note (Signed)
Benefits from CPAP with good compliance and control Plan- continue auto 5-15 

## 2019-08-02 NOTE — Assessment & Plan Note (Signed)
Reminded to use Flonase, possibly sudafed

## 2019-08-02 NOTE — Assessment & Plan Note (Signed)
Well controlled Plan- refill inhaled meds to keep available

## 2019-08-22 DIAGNOSIS — Z96651 Presence of right artificial knee joint: Secondary | ICD-10-CM | POA: Diagnosis not present

## 2019-08-22 DIAGNOSIS — G4733 Obstructive sleep apnea (adult) (pediatric): Secondary | ICD-10-CM | POA: Diagnosis not present

## 2019-08-23 ENCOUNTER — Other Ambulatory Visit: Payer: Self-pay | Admitting: Family Medicine

## 2019-09-03 ENCOUNTER — Other Ambulatory Visit: Payer: Self-pay

## 2019-09-06 ENCOUNTER — Other Ambulatory Visit: Payer: Self-pay

## 2019-09-06 ENCOUNTER — Ambulatory Visit (INDEPENDENT_AMBULATORY_CARE_PROVIDER_SITE_OTHER): Payer: Medicare Other | Admitting: Family Medicine

## 2019-09-06 VITALS — BP 138/71 | HR 76 | Temp 95.3°F | Resp 16 | Ht 66.0 in | Wt 294.0 lb

## 2019-09-06 DIAGNOSIS — E1165 Type 2 diabetes mellitus with hyperglycemia: Secondary | ICD-10-CM

## 2019-09-06 DIAGNOSIS — E559 Vitamin D deficiency, unspecified: Secondary | ICD-10-CM | POA: Diagnosis not present

## 2019-09-06 DIAGNOSIS — E7800 Pure hypercholesterolemia, unspecified: Secondary | ICD-10-CM

## 2019-09-06 DIAGNOSIS — I10 Essential (primary) hypertension: Secondary | ICD-10-CM | POA: Diagnosis not present

## 2019-09-06 LAB — COMPREHENSIVE METABOLIC PANEL
ALT: 17 U/L (ref 0–35)
AST: 12 U/L (ref 0–37)
Albumin: 4.1 g/dL (ref 3.5–5.2)
Alkaline Phosphatase: 103 U/L (ref 39–117)
BUN: 23 mg/dL (ref 6–23)
CO2: 29 mEq/L (ref 19–32)
Calcium: 10 mg/dL (ref 8.4–10.5)
Chloride: 102 mEq/L (ref 96–112)
Creatinine, Ser: 0.88 mg/dL (ref 0.40–1.20)
GFR: 63.75 mL/min (ref >60.00)
Glucose, Bld: 123 mg/dL — ABNORMAL HIGH (ref 70–99)
Potassium: 4.6 mEq/L (ref 3.5–5.1)
Sodium: 140 mEq/L (ref 135–145)
Total Bilirubin: 0.6 mg/dL (ref 0.2–1.2)
Total Protein: 7.2 g/dL (ref 6.0–8.3)

## 2019-09-06 LAB — VITAMIN D 25 HYDROXY (VIT D DEFICIENCY, FRACTURES): VITD: 15.69 ng/mL — ABNORMAL LOW (ref 30.00–100.00)

## 2019-09-06 LAB — HEMOGLOBIN A1C: Hgb A1c MFr Bld: 7 % — ABNORMAL HIGH (ref 4.6–6.5)

## 2019-09-06 LAB — CBC
HCT: 41.3 % (ref 36.0–46.0)
Hemoglobin: 13.4 g/dL (ref 12.0–15.0)
MCHC: 32.4 g/dL (ref 30.0–36.0)
MCV: 84 fl (ref 78.0–100.0)
Platelets: 267 10*3/uL (ref 150.0–400.0)
RBC: 4.92 Mil/uL (ref 3.87–5.11)
RDW: 15.7 % — ABNORMAL HIGH (ref 11.5–15.5)
WBC: 8.2 10*3/uL (ref 4.0–10.5)

## 2019-09-06 LAB — LIPID PANEL
Cholesterol: 127 mg/dL (ref 0–200)
HDL: 31.7 mg/dL — ABNORMAL LOW (ref >39.00)
LDL Cholesterol: 57 mg/dL (ref 0–99)
NonHDL: 95.07
Total CHOL/HDL Ratio: 4
Triglycerides: 190 mg/dL — ABNORMAL HIGH (ref 0.0–149.0)
VLDL: 38 mg/dL (ref 0.0–40.0)

## 2019-09-06 LAB — TSH: TSH: 1.84 u[IU]/mL (ref 0.35–4.50)

## 2019-09-06 MED ORDER — CARVEDILOL 6.25 MG PO TABS: 6.2500 mg | ORAL_TABLET | Freq: Two times a day (BID) | ORAL | 1 refills | Status: DC

## 2019-09-06 MED ORDER — METFORMIN HCL 500 MG PO TABS: 500.0000 mg | ORAL_TABLET | Freq: Two times a day (BID) | ORAL | 1 refills | Status: DC

## 2019-09-06 MED ORDER — ROSUVASTATIN CALCIUM 40 MG PO TABS: 40.0000 mg | ORAL_TABLET | Freq: Every day | ORAL | 3 refills | Status: DC

## 2019-09-06 MED ORDER — ESOMEPRAZOLE MAGNESIUM 40 MG PO CPDR: 40.0000 mg | DELAYED_RELEASE_CAPSULE | Freq: Every day | ORAL | 3 refills | Status: DC

## 2019-09-06 MED ORDER — DILTIAZEM HCL ER COATED BEADS 120 MG PO CP24: 120.0000 mg | ORAL_CAPSULE | Freq: Every day | ORAL | 3 refills | Status: DC

## 2019-09-06 MED ORDER — LOSARTAN POTASSIUM 100 MG PO TABS: 100.0000 mg | ORAL_TABLET | Freq: Every day | ORAL | 3 refills | Status: DC

## 2019-09-06 MED ORDER — MONTELUKAST SODIUM 10 MG PO TABS: 10.0000 mg | ORAL_TABLET | Freq: Every day | ORAL | 3 refills | Status: DC

## 2019-09-06 MED ORDER — EZETIMIBE 10 MG PO TABS: 10.0000 mg | ORAL_TABLET | Freq: Every day | ORAL | 3 refills | Status: DC

## 2019-09-06 MED ORDER — ESCITALOPRAM OXALATE 20 MG PO TABS: ORAL_TABLET | ORAL | 1 refills | Status: DC

## 2019-09-06 MED ORDER — DICLOFENAC SODIUM 75 MG PO TBEC: 75.0000 mg | DELAYED_RELEASE_TABLET | Freq: Two times a day (BID) | ORAL | 1 refills | Status: DC

## 2019-09-06 MED ORDER — FUROSEMIDE 20 MG PO TABS: 20.0000 mg | ORAL_TABLET | Freq: Every day | ORAL | 3 refills | Status: DC

## 2019-09-06 NOTE — Assessment & Plan Note (Signed)
Encouraged DASH or MIND diet, decrease po intake and increase exercise as tolerated. Needs 7-8 hours of sleep nightly. Avoid trans fats, eat small, frequent meals every 4-5 hours with lean proteins, complex carbs and healthy fats. Minimize simple carbs,

## 2019-09-06 NOTE — Assessment & Plan Note (Signed)
Encouraged heart healthy diet, increase exercise, avoid trans fats, consider a krill oil cap daily. Tolerating Rosuvastatin 

## 2019-09-06 NOTE — Assessment & Plan Note (Signed)
Well controlled, no changes to meds. Encouraged heart healthy diet such as the DASH diet and exercise as tolerated.  °

## 2019-09-06 NOTE — Assessment & Plan Note (Signed)
[id number] acceptable, minimize simple carbs. Increase exercise as tolerated. Continue current meds 

## 2019-09-06 NOTE — Patient Instructions (Addendum)
Schiff Megared krill oil or generic equivalent  Omron Blood Pressure cuff, upper arm, want BP 100-140/60-90 Pulse oximeter, want oxygen in 90s  Weekly vitals  Take Multivitamin with minerals, selenium Vitamin D 1000-2000 IU daily Probiotic with lactobacillus and bifidophilus Asprin EC 81 mg daily  Melatonin 2-5 mg at bedtime  [email or web address]  High Cholesterol  High cholesterol is a condition in which the blood has high levels of a white, waxy, fat-like substance (cholesterol). The human body needs small amounts of cholesterol. The liver makes all the cholesterol that the body needs. Extra (excess) cholesterol comes from the food that we eat. Cholesterol is carried from the liver by the blood through the blood vessels. If you have high cholesterol, deposits (plaques) may build up on the walls of your blood vessels (arteries). Plaques make the arteries narrower and stiffer. Cholesterol plaques increase your risk for heart attack and stroke. Work with your health care provider to keep your cholesterol levels in a healthy range. What increases the risk? This condition is more likely to develop in people who:  Eat foods that are high in animal fat (saturated fat) or cholesterol.  Are overweight.  Are not getting enough exercise.  Have a family history of high cholesterol. What are the signs or symptoms? There are no symptoms of this condition. How is this diagnosed? This condition may be diagnosed from the results of a blood test.  If you are older than age 39, your health care provider may check your cholesterol every 4-6 years.  You may be checked more often if you already have high cholesterol or other risk factors for heart disease. The blood test for cholesterol measures:  "Bad" cholesterol (LDL cholesterol). This is the main type of cholesterol that causes heart disease. The desired level for LDL is less than 100.  "Good" cholesterol  (HDL cholesterol). This type helps to protect against heart disease by cleaning the arteries and carrying the LDL away. The desired level for HDL is 60 or higher.  Triglycerides. These are fats that the body can store or burn for energy. The desired number for triglycerides is lower than 150.  Total cholesterol. This is a measure of the total amount of cholesterol in your blood, including LDL cholesterol, HDL cholesterol, and triglycerides. A healthy number is less than 200. How is this treated? This condition is treated with diet changes, lifestyle changes, and medicines. Diet changes  This may include eating more whole grains, fruits, vegetables, nuts, and fish.  This may also include cutting back on red meat and foods that have a lot of added sugar. Lifestyle changes  Changes may include getting at least 40 minutes of aerobic exercise 3 times a week. Aerobic exercises include walking, biking, and swimming. Aerobic exercise along with a healthy diet can help you maintain a healthy weight.  Changes may also include quitting smoking. Medicines  Medicines are usually given if diet and lifestyle changes have failed to reduce your cholesterol to healthy levels.  Your health care provider may prescribe a statin medicine. Statin medicines have been shown to reduce cholesterol, which can reduce the risk of heart disease. Follow these instructions at home: Eating and drinking If told by your health care provider:  Eat chicken (without skin), fish, veal, shellfish, ground [place] breast, and round or loin cuts of red meat.  Do not eat fried foods or fatty meats, such as hot dogs and salami.  Eat plenty of fruits, such as apples.  Eat plenty of  vegetables, such as broccoli, potatoes, and carrots.  Eat beans, peas, and lentils.  Eat grains such as barley, rice, couscous, and bulgur wheat.  Eat pasta without cream sauces.  Use skim or nonfat milk, and eat low-fat or nonfat yogurt and  cheeses.  Do not eat or drink whole milk, cream, ice cream, egg yolks, or hard cheeses.  Do not eat stick margarine or tub margarines that contain trans fats (also called partially hydrogenated oils).  Do not eat saturated tropical oils, such as coconut oil and palm oil.  Do not eat cakes, cookies, crackers, or other baked goods that contain trans fats.  General instructions  Exercise as directed by your health care provider. Increase your activity level with activities such as gardening, walking, and taking the stairs.  Take over-the-counter and prescription medicines only as told by your health care provider.  Do not use any products that contain nicotine or tobacco, such as cigarettes and e-cigarettes. If you need help quitting, ask your health care provider.  Keep all follow-up visits as told by your health care provider. This is important. Contact a health care provider if:  You are struggling to maintain a healthy diet or weight.  You need help to start on an exercise program.  You need help to stop smoking. Get help right away if:  You have chest pain.  You have trouble breathing. This information is not intended to replace advice given to you by your health care provider. Make sure you discuss any questions you have with your health care provider. Document Revised: 06/13/2017 Document Reviewed: 12/09/2015 Elsevier Patient Education  [employer].

## 2019-09-06 NOTE — Assessment & Plan Note (Signed)
Supplement and monitor 

## 2019-09-06 NOTE — Progress Notes (Signed)
Subjective:    [name], female    DOB: Nov 04, 1950, 69 y.o.   MRN: [id number]  Chief Complaint  Patient presents with  . Diabetes  . Hyperlipidemia  . Hypertension  . Depression    HPI Patient is in today for follow up on chronic medical concerns. She has had a rough year with her pandemic. She has been isolated from her family and been unable to see her grand kids. She acknowledges her anxiety and anhedonia are up. No suicidal ideation. She notes fatigue and she is very frustrated with her poor diet and weight gain. Denies CP/palp/SOB/HA/congestion/fevers/GI or GU c/o. Taking meds as prescribed  Past Medical History:  Diagnosis Date  . Anemia 04/05/2012  . Anxiety   . Anxiety state 09/11/2007   Qualifier: Diagnosis of  By: Scherrie Gerlach    . Asthma   . Atrial tachycardia, paroxysmal ([place])   . Back pain 10/02/2014  . Breast cancer ([place]) 02/17/2017  . Chicken pox as a child  . Chronic diastolic CHF (congestive heart failure), NYHA class 1 ([place])   . Complication of anesthesia    pt states feels different in her body after anesthesia when waking up and also experiences a smell of burnt plastic for approx a wk   . Constipation   . Depression   . Diabetes ([place])   . Dilated aortic root ([place])    52m by echo 06/2019  . Dysuria 02/17/2017  . Elevated LFTs 04/05/2012  . GERD (gastroesophageal reflux disease)   . Goiter   . Heart murmur    hx of one at birth   . History of kidney stones   . History of radiation therapy 10/31/16-12/17/16   left breast 45 Gy in 25 fractions, left breast boost 16 Gy in 8 fractions  . Hx of colonic polyps   . Hyperlipidemia   . Hypertension   . Insomnia 10/08/2016  . Joint pain   . Malignant neoplasm of upper-outer quadrant of left female breast ([place] 05/14/2016   not with patient  . Measles as a child  . Mumps as a child  . OA (osteoarthritis) of knee 04/05/2012  . Obesity 07/11/2014  . PONV (postoperative nausea and  vomiting)   . Preventative health care 09/05/2013  . PVC's (premature ventricular contractions) 05/02/2016  . Shortness of breath dyspnea    walking distances / climbing stairs  . Sleep apnea 04/05/2012  . Tinea corporis 02/23/2013  . Type 2 diabetes mellitus with hyperglycemia ([place] 12/31/2013  . Vasomotor rhinitis 04/05/2012  . Ventral hernia   . Wheezing     Past Surgical History:  Procedure Laterality Date  . BREAST LUMPECTOMY Left 2017  . BREAST LUMPECTOMY WITH RADIOACTIVE SEED AND SENTINEL LYMPH NODE BIOPSY Left 06/21/2016   Procedure: LEFT BREAST LUMPECTOMY WITH RADIOACTIVE SEED AND SENTINEL LYMPH NODE BIOPSY, INJECT BLUE DYE LEFT BREAST;  Surgeon: HFanny Skates MD;  [facility]  Service: General;  Laterality: Left;  . CARDIAC CATHETERIZATION     normal coroary arteries per patient  . CARDIOVASCULAR STRESS TEST     10/12/2013  . CESAREAN SECTION     X 3  . CHOLECYSTECTOMY    . COLONOSCOPY WITH PROPOFOL N/A 03/28/2015   Procedure: COLONOSCOPY WITH PROPOFOL;  Surgeon: JJuanita Craver MD;  Location: [facility];  Service: Endoscopy;  Laterality: N/A;  . HERNIA REPAIR  02/08/11   ventral hernia  . JOINT REPLACEMENT     bilateral  . KNEE ARTHROSCOPY  05/2010   bilateral  . LEFT AND RIGHT HEART CATHETERIZATION WITH CORONARY ANGIOGRAM N/A 11/08/2013   Procedure: LEFT AND RIGHT HEART CATHETERIZATION WITH CORONARY ANGIOGRAM;  Surgeon: [name], MD;  Location: [facility] CATH LAB;  Service: Cardiovascular;  Laterality: N/A;  . MENISCUS REPAIR  2009  . MOUTH SURGERY     teeth implants  . PORT-A-CATH REMOVAL N/A 01/24/2017   Procedure: REMOVAL PORT-A-CATH;  Surgeon: Fanny Skates, MD;  Location: [place];  Service: General;  Laterality: N/A;  . PORTACATH PLACEMENT Right 07/16/2016   Procedure: INSERTION PORT-A-CATH RIGHT INTERNAL JUGULAR WITH ULTRASOUND;  Surgeon: Fanny Skates, MD;  Location: [place];  Service: General;  Laterality: Right;  . TOTAL KNEE ARTHROPLASTY  2011   left    . TOTAL KNEE ARTHROPLASTY Right 05/10/2014   Procedure: RIGHT TOTAL KNEE ARTHROPLASTY;  Surgeon: Mauri Pole, MD;  Location: [facility];  Service: Orthopedics;  Laterality: Right;  . TUBAL LIGATION    . WISDOM TOOTH EXTRACTION  2000    Family History  Problem Relation Age of Onset  . Thyroid disease Mother   . Hyperlipidemia Mother   . Depression Mother   . Anxiety disorder Mother   . Heart attack Father 44  . Hypertension Father   . Arthritis Father        RA  . Coronary artery disease Father   . High Cholesterol Father   . Coronary artery disease Brother   . Heart disease Brother   . Cancer Maternal Aunt        colon  . Cancer Maternal Grandmother        colon  . Heart attack Maternal Grandfather   . Alcohol abuse Paternal Grandfather     Social History   Socioeconomic History  . Marital status: Divorced    Spouse name: Not on file  . Number of children: 3  . Years of education: Not on file  . Highest education level: Not on file  Occupational History  . Occupation: retired - [employer]  Tobacco Use  . Smoking status: Never Smoker  . Smokeless tobacco: Never Used  Substance and Sexual Activity  . Alcohol use: Not Currently    Comment: rarely  . Drug use: No  . Sexual activity: Never    Comment: lives with mother currently-stress  Other Topics Concern  . Not on file  Social History Narrative  . Not on file   Social Determinants of Health   Financial Resource Strain:   . Difficulty of Paying Living Expenses:   Food Insecurity: No Food Insecurity  . Worried About Charity fundraiser in the Last Year: Never true  . Ran Out of Food in the Last Year: Never true  Transportation Needs: No Transportation Needs  . Lack of Transportation (Medical): No  . Lack of Transportation (Non-Medical): No  Physical Activity:   . Days of Exercise per Week:   . Minutes of Exercise per Session:   Stress:   . Feeling of Stress :   Social Connections:   . Frequency of  Communication with Friends and Family:   . Frequency of Social Gatherings with Friends and Family:   . Attends Religious Services:   . Active Member of Clubs or Organizations:   . Attends Archivist Meetings:   Marland Kitchen Marital Status:   Intimate Partner Violence:   . Fear of Current or Ex-Partner:   . Emotionally Abused:   Marland Kitchen Physically Abused:   . Sexually Abused:  Outpatient Medications Prior to Visit  Medication Sig Dispense Refill  . acetaminophen (TYLENOL) 500 MG tablet Take 1,000 mg by mouth 2 (two) times daily as needed for moderate pain or headache.    . anastrozole (ARIMIDEX) 1 MG tablet Take 1 tablet (1 mg total) by mouth daily. 90 tablet 3  . aspirin EC 81 MG tablet Take 81 mg by mouth at bedtime.     Marland Kitchen BREO ELLIPTA 100-25 MCG/INH AEPB INHALE 1 PUFF INTO LUNGS ONCE A DAY 60 each 3  . fluticasone (FLONASE) 50 MCG/ACT nasal spray Place 2 sprays into both nostrils daily. 16 g 6  . glucose blood (ACCU-CHEK AVIVA) test strip Twice daily (please use brand that patient insurance covers) 100 each 5  . Hypromellose (ARTIFICIAL TEARS OP) Place 1 drop into both eyes daily as needed (dry eyes).     Marland Kitchen loratadine (CLARITIN) 10 MG tablet Take 1 tablet (10 mg total) by mouth daily. 30 tablet 11  . VENTOLIN HFA 108 (90 Base) MCG/ACT inhaler INHALE 2 PUFFS INTO THE LUNGS EVERY 6 HOURS AS NEEDED FOR WHEEZING INTO RECTUM SHORTNESS OF BREATH 18 g 12  . carvedilol (COREG) 6.25 MG tablet TAKE 1 TABLET TWICE DAILY 180 tablet 1  . diclofenac (VOLTAREN) 75 MG EC tablet TAKE 1 TABLET TWICE DAILY 180 tablet 1  . diltiazem (CARDIZEM CD) 120 MG 24 hr capsule TAKE 1 CAPSULE EVERY DAY 90 capsule 2  . escitalopram (LEXAPRO) 20 MG tablet TAKE 1 TABLET DAILY 90 tablet 0  . esomeprazole (NEXIUM) 40 MG capsule TAKE 1 CAPSULE EVERY DAY 90 capsule 2  . ezetimibe (ZETIA) 10 MG tablet TAKE 1 TABLET EVERY DAY 90 tablet 3  . furosemide (LASIX) 20 MG tablet TAKE 1 TABLET EVERY DAY 90 tablet 2  . losartan  (COZAAR) 100 MG tablet TAKE 1 TABLET EVERY DAY 90 tablet 2  . metFORMIN (GLUCOPHAGE) 500 MG tablet TAKE 1 TABLET TWICE DAILY WITH MEALS 180 tablet 0  . montelukast (SINGULAIR) 10 MG tablet TAKE 1 TABLET (10 MG TOTAL) BY MOUTH AT BEDTIME. 90 tablet 2  . rosuvastatin (CRESTOR) 40 MG tablet TAKE 1 TABLET EVERY DAY 90 tablet 3  . icosapent Ethyl (VASCEPA) 1 g capsule Take 2 capsules (2 g total) by mouth 2 (two) times daily. 360 capsule 3   No facility-administered medications prior to visit.    Allergies  Allergen Reactions  . No Known Allergies     Review of Systems  Constitutional: Positive for malaise/fatigue. Negative for fever.  HENT: Positive for congestion.   Eyes: Negative for blurred vision.  Respiratory: Negative for shortness of breath.   Cardiovascular: Negative for chest pain, palpitations and leg swelling.  Gastrointestinal: Negative for abdominal pain, blood in stool and nausea.  Genitourinary: Negative for dysuria and frequency.  Musculoskeletal: Negative for falls.  Skin: Negative for rash.  Neurological: Negative for dizziness, loss of consciousness and headaches.  Endo/Heme/Allergies: Negative for environmental allergies.  Psychiatric/Behavioral: Positive for depression. The patient is nervous/anxious.        Objective:    Physical Exam Vitals and nursing note reviewed.  Constitutional:      General: She is not in acute distress.    Appearance: She is well-developed.  HENT:     Head: Normocephalic and atraumatic.     Nose: Nose normal.  Eyes:     General:        Right eye: No discharge.        Left eye: No discharge.  Cardiovascular:  Rate and Rhythm: Normal rate and regular rhythm.     Heart sounds: No murmur.  Pulmonary:     Effort: Pulmonary effort is normal.     Breath sounds: Normal breath sounds.  Abdominal:     General: Bowel sounds are normal.     Palpations: Abdomen is soft.     Tenderness: There is no abdominal tenderness.    Musculoskeletal:     Cervical back: Normal range of motion and neck supple.  Skin:    General: Skin is warm and dry.  Neurological:     Mental Status: She is alert and oriented to person, place, and time.     BP 138/71 (BP Location: Right Arm, Cuff Size: Large)   Pulse 76   Temp (!) 95.3 F (35.2 C) (Temporal)   Resp 16   Ht '5\' 6"'  (1.676 m)   Wt 294 lb (133.4 kg)   SpO2 98%   BMI 47.45 kg/m  Wt Readings from Last 3 Encounters:  09/06/19 294 lb (133.4 kg)  07/02/19 280 lb (127 kg)  04/16/19 275 lb (124.7 kg)    Diabetic Foot Exam - Simple   No data filed     Lab Results  Component Value Date   WBC 8.2 09/06/2019   HGB 13.4 09/06/2019   HCT 41.3 09/06/2019   PLT 267.0 09/06/2019   GLUCOSE 123 (H) 09/06/2019   CHOL 127 09/06/2019   TRIG 190.0 (H) 09/06/2019   HDL 31.70 (L) 09/06/2019   LDLDIRECT 108.0 10/08/2016   LDLCALC 57 09/06/2019   ALT 17 09/06/2019   AST 12 09/06/2019   NA 140 09/06/2019   K 4.6 09/06/2019   CL 102 09/06/2019   CREATININE 0.88 09/06/2019   BUN 23 09/06/2019   CO2 29 09/06/2019   TSH 1.84 09/06/2019   INR 1.01 05/03/2014   HGBA1C 7.0 (H) 09/06/2019    Lab Results  Component Value Date   TSH 1.84 09/06/2019   Lab Results  Component Value Date   WBC 8.2 09/06/2019   HGB 13.4 09/06/2019   HCT 41.3 09/06/2019   MCV 84.0 09/06/2019   PLT 267.0 09/06/2019   Lab Results  Component Value Date   NA 140 09/06/2019   K 4.6 09/06/2019   CHLORIDE 107 04/11/2017   CO2 29 09/06/2019   GLUCOSE 123 (H) 09/06/2019   BUN 23 09/06/2019   CREATININE 0.88 09/06/2019   BILITOT 0.6 09/06/2019   ALKPHOS 103 09/06/2019   AST 12 09/06/2019   ALT 17 09/06/2019   PROT 7.2 09/06/2019   ALBUMIN 4.1 09/06/2019   CALCIUM 10.0 09/06/2019   ANIONGAP 10 10/06/2017   EGFR >60 04/11/2017   GFR 63.75 09/06/2019   Lab Results  Component Value Date   CHOL 127 09/06/2019   Lab Results  Component Value Date   HDL 31.70 (L) 09/06/2019   Lab  Results  Component Value Date   LDLCALC 57 09/06/2019   Lab Results  Component Value Date   TRIG 190.0 (H) 09/06/2019   Lab Results  Component Value Date   CHOLHDL 4 09/06/2019   Lab Results  Component Value Date   HGBA1C 7.0 (H) 09/06/2019       Assessment & Plan:   Problem List Items Addressed This Visit    Hyperlipidemia - Primary    Encouraged heart healthy diet, increase exercise, avoid trans fats, consider a krill oil cap daily. Tolerating Rosuvastatin      Relevant Medications   carvedilol (COREG) 6.25 MG  tablet   diltiazem (CARDIZEM CD) 120 MG 24 hr capsule   ezetimibe (ZETIA) 10 MG tablet   furosemide (LASIX) 20 MG tablet   losartan (COZAAR) 100 MG tablet   rosuvastatin (CRESTOR) 40 MG tablet   Essential hypertension    Well controlled, no changes to meds. Encouraged heart healthy diet such as the DASH diet and exercise as tolerated.       Relevant Medications   carvedilol (COREG) 6.25 MG tablet   diltiazem (CARDIZEM CD) 120 MG 24 hr capsule   ezetimibe (ZETIA) 10 MG tablet   furosemide (LASIX) 20 MG tablet   losartan (COZAAR) 100 MG tablet   rosuvastatin (CRESTOR) 40 MG tablet   Other Relevant Orders   Comprehensive metabolic panel (Completed)   Lipid panel (Completed)   TSH (Completed)   CBC (Completed)   Type 2 diabetes mellitus with hyperglycemia (HCC)    hgba1c acceptable, minimize simple carbs. Increase exercise as tolerated. Continue current meds      Relevant Medications   losartan (COZAAR) 100 MG tablet   metFORMIN (GLUCOPHAGE) 500 MG tablet   rosuvastatin (CRESTOR) 40 MG tablet   Other Relevant Orders   Hemoglobin A1c (Completed)   Obesity    Encouraged DASH or MIND diet, decrease po intake and increase exercise as tolerated. Needs 7-8 hours of sleep nightly. Avoid trans fats, eat small, frequent meals every 4-5 hours with lean proteins, complex carbs and healthy fats. Minimize simple carbs,       Relevant Medications   metFORMIN  (GLUCOPHAGE) 500 MG tablet   Vitamin D deficiency    Supplement and monitor      Relevant Medications   esomeprazole (NEXIUM) 40 MG capsule   Other Relevant Orders   VITAMIN D 25 Hydroxy (Vit-D Deficiency, Fractures) (Completed)      I have discontinued Aryanah A. Quiros's icosapent Ethyl. I have also changed her carvedilol, diclofenac, diltiazem, escitalopram, esomeprazole, ezetimibe, furosemide, losartan, metFORMIN, and rosuvastatin. Additionally, I am having her maintain her aspirin EC, Hypromellose (ARTIFICIAL TEARS OP), acetaminophen, loratadine, fluticasone, glucose blood, anastrozole, Breo Ellipta, Ventolin HFA, and montelukast.  Meds ordered this encounter  Medications  . carvedilol (COREG) 6.25 MG tablet    Sig: Take 1 tablet (6.25 mg total) by mouth 2 (two) times daily.    Dispense:  180 tablet    Refill:  1  . diclofenac (VOLTAREN) 75 MG EC tablet    Sig: Take 1 tablet (75 mg total) by mouth 2 (two) times daily.    Dispense:  180 tablet    Refill:  1  . diltiazem (CARDIZEM CD) 120 MG 24 hr capsule    Sig: Take 1 capsule (120 mg total) by mouth daily.    Dispense:  90 capsule    Refill:  3  . escitalopram (LEXAPRO) 20 MG tablet    Sig: Take 20 mg po in am and 10 mg po in pm    Dispense:  135 tablet    Refill:  1  . esomeprazole (NEXIUM) 40 MG capsule    Sig: Take 1 capsule (40 mg total) by mouth daily.    Dispense:  90 capsule    Refill:  3  . ezetimibe (ZETIA) 10 MG tablet    Sig: Take 1 tablet (10 mg total) by mouth daily.    Dispense:  90 tablet    Refill:  3  . furosemide (LASIX) 20 MG tablet    Sig: Take 1 tablet (20 mg total) by mouth daily.  Dispense:  90 tablet    Refill:  3  . losartan (COZAAR) 100 MG tablet    Sig: Take 1 tablet (100 mg total) by mouth daily.    Dispense:  90 tablet    Refill:  3  . metFORMIN (GLUCOPHAGE) 500 MG tablet    Sig: Take 1 tablet (500 mg total) by mouth 2 (two) times daily with a meal.    Dispense:  180 tablet     Refill:  1  . montelukast (SINGULAIR) 10 MG tablet    Sig: Take 1 tablet (10 mg total) by mouth at bedtime.    Dispense:  90 tablet    Refill:  3  . rosuvastatin (CRESTOR) 40 MG tablet    Sig: Take 1 tablet (40 mg total) by mouth daily.    Dispense:  90 tablet    Refill:  3     Penni Homans, MD

## 2019-09-13 ENCOUNTER — Encounter: Payer: Self-pay | Admitting: *Deleted

## 2019-09-13 ENCOUNTER — Other Ambulatory Visit: Payer: Self-pay | Admitting: *Deleted

## 2019-09-13 MED ORDER — VITAMIN D (ERGOCALCIFEROL) 1.25 MG (50000 UNIT) PO CAPS: 50000.0000 [IU] | ORAL_CAPSULE | ORAL | 4 refills | Status: DC

## 2019-09-16 ENCOUNTER — Ambulatory Visit: Payer: Medicare Other | Admitting: *Deleted

## 2019-09-20 ENCOUNTER — Ambulatory Visit: Payer: Self-pay | Admitting: *Deleted

## 2019-09-20 DIAGNOSIS — Z96651 Presence of right artificial knee joint: Secondary | ICD-10-CM | POA: Diagnosis not present

## 2019-09-20 DIAGNOSIS — G4733 Obstructive sleep apnea (adult) (pediatric): Secondary | ICD-10-CM | POA: Diagnosis not present

## 2019-09-22 NOTE — Progress Notes (Signed)
Nurse connected with patient 09/23/19 at  8:45 AM EDT by a telephone enabled telemedicine application and verified that I am speaking with the correct person using two identifiers. Patient stated full name and DOB. Patient gave permission to continue with virtual visit. Patient's location was at home and Nurse's location was at [place] office.   Subjective:   [name] is a 69 y.o. female who presents for Medicare Annual (Subsequent) preventive examination.  Review of Systems:  Home Safety/Smoke Alarms: Feels safe in home. Smoke alarms in place.  Lives alone.   Female:    Mammo-   05/17/19    Dexa scan- 04/29/16. Declines at this time.        CCS- 03/28/15. Recall 7 yrs.     Objective:     Vitals: Unable to assess. This visit is enabled though telemedicine due to Covid 19.  Advanced Directives 09/23/2019 04/02/2019 09/18/2018 05/29/2017 05/07/2017 04/22/2017 04/17/2017  Does Patient Have a Medical Advance Directive? Yes Yes Yes Yes Yes Yes Yes  Type of Paramedic of [place];Living will [place];Living will [place];Living will Living will [place];Living will [place];Living will -  Does patient want to make changes to medical advance directive? No - Patient declined No - Patient declined No - Patient declined - No - Patient declined - -  Copy of [place] in Chart? No - copy requested No - copy requested No - copy requested - No - copy requested No - copy requested -    Tobacco Social History   Tobacco Use  Smoking Status Never Smoker  Smokeless Tobacco Never Used     Counseling given: Not Answered   Clinical Intake: Pain : No/denies pain      Past Medical History:  Diagnosis Date  . Anemia 04/05/2012  . Anxiety   . Anxiety state 09/11/2007   Qualifier: Diagnosis of  By: Scherrie Gerlach    . Asthma   . Atrial tachycardia, paroxysmal ([place])     . Back pain 10/02/2014  . Breast cancer ([place]) 02/17/2017  . Chicken pox as a child  . Chronic diastolic CHF (congestive heart failure), NYHA class 1 ([place])   . Complication of anesthesia    pt states feels different in her body after anesthesia when waking up and also experiences a smell of burnt plastic for approx a wk   . Constipation   . Depression   . Diabetes ([place])   . Dilated aortic root ([place])    59mm by echo 06/2019  . Dysuria 02/17/2017  . Elevated LFTs 04/05/2012  . GERD (gastroesophageal reflux disease)   . Goiter   . Heart murmur    hx of one at birth   . History of kidney stones   . History of radiation therapy 10/31/16-12/17/16   left breast 45 Gy in 25 fractions, left breast boost 16 Gy in 8 fractions  . Hx of colonic polyps   . Hyperlipidemia   . Hypertension   . Insomnia 10/08/2016  . Joint pain   . Malignant neoplasm of upper-outer quadrant of left female breast ([place]) 05/14/2016   not with patient  . Measles as a child  . Mumps as a child  . OA (osteoarthritis) of knee 04/05/2012  . Obesity 07/11/2014  . PONV (postoperative nausea and vomiting)   . Preventative health care 09/05/2013  . PVC's (premature ventricular contractions) 05/02/2016  . Shortness of breath dyspnea  walking distances / climbing stairs  . Sleep apnea 04/05/2012  . Tinea corporis 02/23/2013  . Type 2 diabetes mellitus with hyperglycemia ([place]) 12/31/2013  . Vasomotor rhinitis 04/05/2012  . Ventral hernia   . Wheezing    Past Surgical History:  Procedure Laterality Date  . BREAST LUMPECTOMY Left 2017  . BREAST LUMPECTOMY WITH RADIOACTIVE SEED AND SENTINEL LYMPH NODE BIOPSY Left 06/21/2016   Procedure: LEFT BREAST LUMPECTOMY WITH RADIOACTIVE SEED AND SENTINEL LYMPH NODE BIOPSY, INJECT BLUE DYE LEFT BREAST;  Surgeon: Fanny Skates, MD;  Location: [facility];  Service: General;  Laterality: Left;  . CARDIAC CATHETERIZATION     normal coroary arteries per patient  . CARDIOVASCULAR STRESS TEST      10/12/2013  . CESAREAN SECTION     X 3  . CHOLECYSTECTOMY    . COLONOSCOPY WITH PROPOFOL N/A 03/28/2015   Procedure: COLONOSCOPY WITH PROPOFOL;  Surgeon: Juanita Craver, MD;  Location: [facility];  Service: Endoscopy;  Laterality: N/A;  . HERNIA REPAIR  02/08/11   ventral hernia  . JOINT REPLACEMENT     bilateral  . KNEE ARTHROSCOPY  05/2010   bilateral  . LEFT AND RIGHT HEART CATHETERIZATION WITH CORONARY ANGIOGRAM N/A 11/08/2013   Procedure: LEFT AND RIGHT HEART CATHETERIZATION WITH CORONARY ANGIOGRAM;  Surgeon: [name], MD;  Location[facility] C[facility] LAB;  Service: Cardiovascular;  Laterality: N/A;  . MENISCUS REPAIR  2009  . MOUTH SURGERY     teeth implants  . PORT-A-CATH REMOVAL N/A 01/24/2017   Procedure: REMOVAL PORT-A-CATH;  Surgeon: Fanny Skates, MD;  Location: [facility];  Service: General;  Laterality: N/A;  . PORTACATH PLACEMENT Right 07/16/2016   Procedure: INSERTION PORT-A-CATH RIGHT INTERNAL JUGULAR WITH ULTRASOUND;  Surgeon: Fanny Skates, MD;  Location: [facility];  Service: General;  Laterality: Right;  . TOTAL KNEE ARTHROPLASTY  2011   left  . TOTAL KNEE ARTHROPLASTY Right 05/10/2014   Procedure: RIGHT TOTAL KNEE ARTHROPLASTY;  Surgeon: Mauri Pole, MD;  Location: [facility];  Service: Orthopedics;  Laterality: Right;  . TUBAL LIGATION    . WISDOM TOOTH EXTRACTION  2000   Family History  Problem Relation Age of Onset  . Thyroid disease Mother   . Hyperlipidemia Mother   . Depression Mother   . Anxiety disorder Mother   . Heart attack Father 65  . Hypertension Father   . Arthritis Father        RA  . Coronary artery disease Father   . High Cholesterol Father   . Coronary artery disease Brother   . Heart disease Brother   . Cancer Maternal Aunt        colon  . Cancer Maternal Grandmother        colon  . Heart attack Maternal Grandfather   . Alcohol abuse Paternal Grandfather    Social History   Socioeconomic History  . Marital status: Divorced    Spouse  name: Not on file  . Number of children: 3  . Years of education: Not on file  . Highest education level: Not on file  Occupational History  . Occupation: retired - [employer]  Tobacco Use  . Smoking status: Never Smoker  . Smokeless tobacco: Never Used  Substance and Sexual Activity  . Alcohol use: Not Currently    Comment: rarely  . Drug use: No  . Sexual activity: Never    Comment: lives with mother currently-stress  Other Topics Concern  . Not on file  Social History Narrative  .  Not on file   Social Determinants of Health   Financial Resource Strain: Low Risk   . Difficulty of Paying Living Expenses: Not hard at all  Food Insecurity: No Food Insecurity  . Worried About Charity fundraiser in the Last Year: Never true  . Ran Out of Food in the Last Year: Never true  Transportation Needs: No Transportation Needs  . Lack of Transportation (Medical): No  . Lack of Transportation (Non-Medical): No  Physical Activity:   . Days of Exercise per Week:   . Minutes of Exercise per Session:   Stress:   . Feeling of Stress :   Social Connections:   . Frequency of Communication with Friends and Family:   . Frequency of Social Gatherings with Friends and Family:   . Attends Religious Services:   . Active Member of Clubs or Organizations:   . Attends Archivist Meetings:   Marland Kitchen Marital Status:     Outpatient Encounter Medications as of 09/23/2019  Medication Sig  . acetaminophen (TYLENOL) 500 MG tablet Take 1,000 mg by mouth 2 (two) times daily as needed for moderate pain or headache.  . anastrozole (ARIMIDEX) 1 MG tablet Take 1 tablet (1 mg total) by mouth daily.  Marland Kitchen aspirin EC 81 MG tablet Take 81 mg by mouth at bedtime.   Marland Kitchen BREO ELLIPTA 100-25 MCG/INH AEPB INHALE 1 PUFF INTO LUNGS ONCE A DAY  . carvedilol (COREG) 6.25 MG tablet Take 1 tablet (6.25 mg total) by mouth 2 (two) times daily.  . diclofenac (VOLTAREN) 75 MG EC tablet Take 1 tablet (75 mg total) by mouth 2  (two) times daily.  Marland Kitchen diltiazem (CARDIZEM CD) 120 MG 24 hr capsule Take 1 capsule (120 mg total) by mouth daily.  Marland Kitchen escitalopram (LEXAPRO) 20 MG tablet Take 20 mg po in am and 10 mg po in pm  . esomeprazole (NEXIUM) 40 MG capsule Take 1 capsule (40 mg total) by mouth daily.  Marland Kitchen ezetimibe (ZETIA) 10 MG tablet Take 1 tablet (10 mg total) by mouth daily.  . fluticasone (FLONASE) 50 MCG/ACT nasal spray Place 2 sprays into both nostrils daily.  . furosemide (LASIX) 20 MG tablet Take 1 tablet (20 mg total) by mouth daily.  Marland Kitchen glucose blood (ACCU-CHEK AVIVA) test strip Twice daily (please use brand that patient insurance covers)  . Hypromellose (ARTIFICIAL TEARS OP) Place 1 drop into both eyes daily as needed (dry eyes).   Marland Kitchen loratadine (CLARITIN) 10 MG tablet Take 1 tablet (10 mg total) by mouth daily.  Marland Kitchen losartan (COZAAR) 100 MG tablet Take 1 tablet (100 mg total) by mouth daily.  . metFORMIN (GLUCOPHAGE) 500 MG tablet Take 1 tablet (500 mg total) by mouth 2 (two) times daily with a meal.  . montelukast (SINGULAIR) 10 MG tablet Take 1 tablet (10 mg total) by mouth at bedtime.  . rosuvastatin (CRESTOR) 40 MG tablet Take 1 tablet (40 mg total) by mouth daily.  . VENTOLIN HFA 108 (90 Base) MCG/ACT inhaler INHALE 2 PUFFS INTO THE LUNGS EVERY 6 HOURS AS NEEDED FOR WHEEZING INTO RECTUM SHORTNESS OF BREATH  . Vitamin D, Ergocalciferol, (DRISDOL) 1.25 MG (50000 UNIT) CAPS capsule Take 1 capsule (50,000 Units total) by mouth every 7 (seven) days.   No facility-administered encounter medications on file as of 09/23/2019.    Activities of Daily Living In your present state of health, do you have any difficulty performing the following activities: 09/23/2019 04/02/2019  Hearing? N N  Vision? N  N  Difficulty concentrating or making decisions? N N  Walking or climbing stairs? N Y  Comment - knee problems  Dressing or bathing? N N  Doing errands, shopping? N N  Preparing Food and eating ? N N  Using the Toilet? N  N  In the past six months, have you accidently leaked urine? N Y  Comment - some incontinence at times  Do you have problems with loss of bowel control? N N  Managing your Medications? N N  Managing your Finances? N N  Housekeeping or managing your Housekeeping? N N  Some recent data might be hidden    Patient Care Team: [name], MD as PCP - General (Family Medicine) Sueanne Margarita, MD as PCP - Cardiology (Cardiology) Fanny Skates, MD as Consulting Physician (General Surgery) Nicholas Lose, MD as Consulting Physician (Hematology and Oncology) Gery Pray, MD as Consulting Physician (Radiation Oncology) Delice Bison, Charlestine Massed, NP as Nurse Practitioner (Hematology and Oncology) Jon Billings, RN as [place] Management    Assessment:   This is a routine wellness examination for Khrystyne. Physical assessment deferred to PCP.  Exercise Activities and Dietary recommendations Current Exercise Habits: The patient does not participate in regular exercise at present, Exercise limited by: None identified Diet (meal preparation, eat out, water intake, caffeinated beverages, dairy products, fruits and vegetables): well balanced   Goals    . Increase physical activity (pt-stated)     Restart Silver Sneakers.    . Weight (lb) < 300 lb (136.1 kg) (pt-stated)     With diet        Fall Risk Fall Risk  09/23/2019 07/02/2019 04/02/2019 12/24/2018 09/23/2018  Falls in the past year? 0 0 0 0 1  Number falls in past yr: 0 - 0 - 0  Injury with Fall? 0 - - - 0  Comment - - - - patient stated that she bruised her knees  Risk for fall due to : - - - - Other (Comment)  Risk for fall due to: Comment - - - - Patient states that it was just a "weird accident" she tripped over her feet.  Follow up Education provided;Falls prevention discussed - - - Education provided   Depression Screen PHQ 2/9 Scores 09/23/2019 09/06/2019 04/02/2019 09/18/2018  PHQ - 2 Score 0 2 0 0  PHQ- 9  Score - 9 - -  Exception Documentation - - - -     Cognitive Function Ad8 score reviewed for issues:  Issues making decisions:no  Less interest in hobbies / activities:no  Repeats questions, stories (family complaining):no  Trouble using ordinary gadgets (microwave, computer, phone):no  Forgets the month or year: no  Mismanaging finances: no  Remembering appts:no  Daily problems with thinking and/or memory:no Ad8 score is=0     MMSE - Mini Mental State Exam 04/22/2017  Orientation to time 5  Orientation to Place 5  Registration 3  Attention/ Calculation 5  Recall 2  Language- name 2 objects 2  Language- repeat 1  Language- follow 3 step command 3  Language- read & follow direction 1  Write a sentence 1  Copy design 1  Total score 29        Immunization History  Administered Date(s) Administered  . Fluad Quad(high Dose 65+) 04/21/2019  . Influenza Split 03/24/2012, 03/24/2013  . Influenza, High Dose Seasonal PF 04/18/2016, 04/21/2017, 03/24/2018  . Influenza-Unspecified 03/21/2014, 03/15/2015, 03/18/2015  . Moderna SARS-COVID-2 Vaccination 07/29/2019, 08/27/2019  . Pneumococcal Conjugate-13 04/18/2016  .  Pneumococcal Polysaccharide-23 05/22/2017  . Tdap 06/24/2008  . Zoster 04/19/2011  . Zoster Recombinat (Shingrix) 04/21/2019   Screening Tests Health Maintenance  Topic Date Due  . FOOT EXAM  Never done  . TETANUS/TDAP  06/24/2018  . OPHTHALMOLOGY EXAM  03/11/2019  . INFLUENZA VACCINE  01/23/2020  . HEMOGLOBIN A1C  03/08/2020  . MAMMOGRAM  05/16/2021  . COLONOSCOPY  03/27/2025  . DEXA SCAN  Completed  . Hepatitis C Screening  Completed  . PNA vac Low Risk Adult  Completed      Plan:    Please schedule your next medicare wellness visit with me in 1 yr.  Continue to eat heart healthy diet (full of fruits, vegetables, whole grains, lean protein, water--limit salt, fat, and sugar intake) and increase physical activity as tolerated.  Continue  doing brain stimulating activities (puzzles, reading, adult coloring books, staying active) to keep memory sharp.   Bring a copy of your living will and/or healthcare power of attorney to your next office visit.   I have personally reviewed and noted the following in the patient's chart:   . Medical and social history . Use of alcohol, tobacco or illicit drugs  . Current medications and supplements . Functional ability and status . Nutritional status . Physical activity . Advanced directives . List of other physicians . Hospitalizations, surgeries, and ER visits in previous 12 months . Vitals . Screenings to include cognitive, depression, and falls . Referrals and appointments  In addition, I have reviewed and discussed with patient certain preventive protocols, quality metrics, and best practice recommendations. A written personalized care plan for preventive services as well as general preventive health recommendations were provided to patient.     Shela Nevin, [place]  09/23/2019

## 2019-09-23 ENCOUNTER — Encounter: Payer: Self-pay | Admitting: *Deleted

## 2019-09-23 ENCOUNTER — Ambulatory Visit (INDEPENDENT_AMBULATORY_CARE_PROVIDER_SITE_OTHER): Payer: Medicare Other | Admitting: *Deleted

## 2019-09-23 ENCOUNTER — Other Ambulatory Visit: Payer: Self-pay

## 2019-09-23 DIAGNOSIS — Z0000 Encounter for general adult medical examination without abnormal findings: Secondary | ICD-10-CM | POA: Diagnosis not present

## 2019-09-23 NOTE — Patient Instructions (Signed)
Please schedule your next medicare wellness visit with me in 1 yr.  Continue to eat heart healthy diet (full of fruits, vegetables, whole grains, lean protein, water--limit salt, fat, and sugar intake) and increase physical activity as tolerated.  Continue doing brain stimulating activities (puzzles, reading, adult coloring books, staying active) to keep memory sharp.   Bring a copy of your living will and/or healthcare power of attorney to your next office visit.   [name] , Thank you for taking time to come for your Medicare Wellness Visit. I appreciate your ongoing commitment to your health goals. Please review the following plan we discussed and let me know if I can assist you in the future.   These are the goals we discussed: Goals    . Increase physical activity (pt-stated)     Restart Silver Sneakers.    . Weight (lb) < 300 lb (136.1 kg) (pt-stated)     With diet        This is a list of the screening recommended for you and due dates:  Health Maintenance  Topic Date Due  . Complete foot exam   Never done  . Tetanus Vaccine  06/24/2018  . Eye exam for diabetics  03/11/2019  . Flu Shot  01/23/2020  . Hemoglobin A1C  03/08/2020  . Mammogram  05/16/2021  . Colon Cancer Screening  03/27/2025  . DEXA scan (bone density measurement)  Completed  .  Hepatitis C: One time screening is recommended by Center for Disease Control  (CDC) for  adults born from [birth year] through 1965.   Completed  . Pneumonia vaccines  Completed    Preventive Care 37 Years and Older, Female Preventive care refers to lifestyle choices and visits with your health care provider that can promote health and wellness. This includes:  A yearly physical exam. This is also called an annual well check.  Regular dental and eye exams.  Immunizations.  Screening for certain conditions.  Healthy lifestyle choices, such as diet and exercise. What can I expect for my preventive care visit? Physical  exam Your health care provider will check:  Height and weight. These may be used to calculate body mass index (BMI), which is a measurement that tells if you are at a healthy weight.  Heart rate and blood pressure.  Your skin for abnormal spots. Counseling Your health care provider may ask you questions about:  Alcohol, tobacco, and drug use.  Emotional well-being.  Home and relationship well-being.  Sexual activity.  Eating habits.  History of falls.  Memory and ability to understand (cognition).  Work and work Statistician.  Pregnancy and menstrual history. What immunizations do I need?  Influenza (flu) vaccine  This is recommended every year. Tetanus, diphtheria, and pertussis (Tdap) vaccine  You may need a Td booster every 10 years. Varicella (chickenpox) vaccine  You may need this vaccine if you have not already been vaccinated. Zoster (shingles) vaccine  You may need this after age 26. Pneumococcal conjugate (PCV13) vaccine  One dose is recommended after age 6. Pneumococcal polysaccharide (PPSV23) vaccine  One dose is recommended after age 40. Measles, mumps, and rubella (MMR) vaccine  You may need at least one dose of MMR if you were born in 1957 or later. You may also need a second dose. Meningococcal conjugate (MenACWY) vaccine  You may need this if you have certain conditions. Hepatitis A vaccine  You may need this if you have certain conditions or if you travel or work in  places where you may be exposed to hepatitis A. Hepatitis B vaccine  You may need this if you have certain conditions or if you travel or work in places where you may be exposed to hepatitis B. Haemophilus influenzae type b (Hib) vaccine  You may need this if you have certain conditions. You may receive vaccines as individual doses or as more than one vaccine together in one shot (combination vaccines). Talk with your health care provider about the risks and benefits of  combination vaccines. What tests do I need? Blood tests  Lipid and cholesterol levels. These may be checked every 5 years, or more frequently depending on your overall health.  Hepatitis C test.  Hepatitis B test. Screening  Lung cancer screening. You may have this screening every year starting at age 21 if you have a 30-pack-year history of smoking and currently smoke or have quit within the past 15 years.  Colorectal cancer screening. All adults should have this screening starting at age 42 and continuing until age 14. Your health care provider may recommend screening at age 40 if you are at increased risk. You will have tests every 1-10 years, depending on your results and the type of screening test.  Diabetes screening. This is done by checking your blood sugar (glucose) after you have not eaten for a while (fasting). You may have this done every 1-3 years.  Mammogram. This may be done every 1-2 years. Talk with your health care provider about how often you should have regular mammograms.  BRCA-related cancer screening. This may be done if you have a family history of breast, ovarian, tubal, or peritoneal cancers. Other tests  Sexually transmitted disease (STD) testing.  Bone density scan. This is done to screen for osteoporosis. You may have this done starting at age 80. Follow these instructions at home: Eating and drinking  Eat a diet that includes fresh fruits and vegetables, whole grains, lean protein, and low-fat dairy products. Limit your intake of foods with high amounts of sugar, saturated fats, and salt.  Take vitamin and mineral supplements as recommended by your health care provider.  Do not drink alcohol if your health care provider tells you not to drink.  If you drink alcohol: ? Limit how much you have to 0-1 drink a day. ? Be aware of how much alcohol is in your drink. In the [place] one drink equals one 12 oz bottle of beer (355 mL), one 5 oz glass of wine (148  mL), or one 1 oz glass of hard liquor (44 mL). Lifestyle  Take daily care of your teeth and gums.  Stay active. Exercise for at least 30 minutes on 5 or more days each week.  Do not use any products that contain nicotine or tobacco, such as cigarettes, e-cigarettes, and chewing tobacco. If you need help quitting, ask your health care provider.  If you are sexually active, practice safe sex. Use a condom or other form of protection in order to prevent STIs (sexually transmitted infections).  Talk with your health care provider about taking a low-dose aspirin or statin. What's next?  Go to your health care provider once a year for a well check visit.  Ask your health care provider how often you should have your eyes and teeth checked.  Stay up to date on all vaccines. This information is not intended to replace advice given to you by your health care provider. Make sure you discuss any questions you have with your health  care provider. Document Revised: 06/04/2018 Document Reviewed: 06/04/2018 Elsevier Patient Education  2020 [employer].

## 2019-09-24 ENCOUNTER — Ambulatory Visit: Payer: Medicare Other | Admitting: *Deleted

## 2019-09-30 ENCOUNTER — Other Ambulatory Visit: Payer: Self-pay

## 2019-09-30 NOTE — Patient Outreach (Signed)
[facility]) Care Management  09/30/2019  Kyrstie Thi [birth year] [id number]   Telephone call to patient for quarterly call.  Patient states she is doing good but would like to do call another time.  Advised patient that CM would call another time.    Plan: RN CM will attempt patient again later in the month and patient agreeable.    Jone Baseman, RN, MSN [place] Management Care Management Coordinator Direct Line [phone] Cell [phone] Toll Free: [phone]  Fax: [phone]

## 2019-10-01 ENCOUNTER — Ambulatory Visit: Payer: Self-pay

## 2019-10-05 DIAGNOSIS — G4733 Obstructive sleep apnea (adult) (pediatric): Secondary | ICD-10-CM | POA: Diagnosis not present

## 2019-10-06 ENCOUNTER — Encounter: Payer: Self-pay | Admitting: Family Medicine

## 2019-10-15 ENCOUNTER — Other Ambulatory Visit: Payer: Self-pay

## 2019-10-15 ENCOUNTER — Encounter: Payer: Self-pay | Admitting: Family Medicine

## 2019-10-15 ENCOUNTER — Ambulatory Visit (INDEPENDENT_AMBULATORY_CARE_PROVIDER_SITE_OTHER): Payer: Medicare Other | Admitting: Family Medicine

## 2019-10-15 DIAGNOSIS — J440 Chronic obstructive pulmonary disease with acute lower respiratory infection: Secondary | ICD-10-CM

## 2019-10-15 DIAGNOSIS — J209 Acute bronchitis, unspecified: Secondary | ICD-10-CM

## 2019-10-15 DIAGNOSIS — J0140 Acute pansinusitis, unspecified: Secondary | ICD-10-CM | POA: Diagnosis not present

## 2019-10-15 MED ORDER — LEVOFLOXACIN 500 MG PO TABS: 500.0000 mg | ORAL_TABLET | Freq: Every day | ORAL | 0 refills | Status: DC

## 2019-10-15 MED ORDER — PROMETHAZINE-DM 6.25-15 MG/5ML PO SYRP: 5.0000 mL | ORAL_SOLUTION | Freq: Four times a day (QID) | ORAL | 0 refills | Status: DC | PRN

## 2019-10-15 NOTE — Progress Notes (Signed)
Virtual Visit via Telephone Note  I connected with [name] on 10/15/19 at  3:20 PM EDT by telephone and verified that I am speaking with the correct person using two identifiers.  Location: Patient: home alone Provider: office    I discussed the limitations, risks, security and privacy concerns of performing an evaluation and management service by telephone and the availability of in person appointments. I also discussed with the patient that there may be a patient responsible charge related to this service. The patient expressed understanding and agreed to proceed.   History of Present Illness: Pt is home c/o sinus issues -- sinus pressure , cough and wheezing.  She is using her inhalers and allergy meds   Observations/Objective: There were no vitals filed for this visit.  Pt did not have her tools to check vs Pt is sob and wheezing over phone  Assessment and Plan: 1. Acute bronchitis with COPD ([place]) levaquin and phenergan dm  Use inhalers Pt will look for her pulse ox and she was instructed to go to ER if pulse ox <95% - promethazine-dextromethorphan (PROMETHAZINE-DM) 6.25-15 MG/5ML syrup; Take 5 mLs by mouth 4 (four) times daily as needed.  Dispense: 118 mL; Refill: 0 - levofloxacin (LEVAQUIN) 500 MG tablet; Take 1 tablet (500 mg total) by mouth daily.  Dispense: 7 tablet; Refill: 0  2. Acute non-recurrent pansinusitis abx and cough meds con't flonase and antihistamine - promethazine-dextromethorphan (PROMETHAZINE-DM) 6.25-15 MG/5ML syrup; Take 5 mLs by mouth 4 (four) times daily as needed.  Dispense: 118 mL; Refill: 0 - levofloxacin (LEVAQUIN) 500 MG tablet; Take 1 tablet (500 mg total) by mouth daily.  Dispense: 7 tablet; Refill: 0   Follow Up Instructions:    I discussed the assessment and treatment plan with the patient. The patient was provided an opportunity to ask questions and all were answered. The patient agreed with the plan and demonstrated an  understanding of the instructions.   The patient was advised to call back or seek an in-person evaluation if the symptoms worsen or if the condition fails to improve as anticipated.  I provided 25 minutes of non-face-to-face time during this encounter.   Ann Held, DO

## 2019-10-18 ENCOUNTER — Other Ambulatory Visit: Payer: Self-pay

## 2019-10-18 NOTE — Patient Outreach (Signed)
[facility]) Care Management  [place]  10/18/2019   [name] [birth year] [id number]  Subjective: Telephone call to patient for disease management follow up. Patient reports she has been dealing with sinusitis and was put on an antibiotic.  She states she feels some better.  She reports she was tested for COVID but that test was negative.  Discussed with patient seasonal allergies and avoiding outside activities. She verbalized understanding.  She reports that her sugars are around 100 when she checks in the mornings.  Most recent A1c was a 7.0.  Discussed with patient the importance of limiting carbohydrates and sweets during the day to lower her A1c. She verbalized understanding and voices no concerns.   Objective:   Encounter Medications:  Outpatient Encounter Medications as of 10/18/2019  Medication Sig  . acetaminophen (TYLENOL) 500 MG tablet Take 1,000 mg by mouth 2 (two) times daily as needed for moderate pain or headache.  . anastrozole (ARIMIDEX) 1 MG tablet Take 1 tablet (1 mg total) by mouth daily.  Marland Kitchen aspirin EC 81 MG tablet Take 81 mg by mouth at bedtime.   Marland Kitchen BREO ELLIPTA 100-25 MCG/INH AEPB INHALE 1 PUFF INTO LUNGS ONCE A DAY  . carvedilol (COREG) 6.25 MG tablet Take 1 tablet (6.25 mg total) by mouth 2 (two) times daily.  . diclofenac (VOLTAREN) 75 MG EC tablet Take 1 tablet (75 mg total) by mouth 2 (two) times daily.  Marland Kitchen diltiazem (CARDIZEM CD) 120 MG 24 hr capsule Take 1 capsule (120 mg total) by mouth daily.  Marland Kitchen escitalopram (LEXAPRO) 20 MG tablet Take 20 mg po in am and 10 mg po in pm  . esomeprazole (NEXIUM) 40 MG capsule Take 1 capsule (40 mg total) by mouth daily.  Marland Kitchen ezetimibe (ZETIA) 10 MG tablet Take 1 tablet (10 mg total) by mouth daily.  . fluticasone (FLONASE) 50 MCG/ACT nasal spray Place 2 sprays into both nostrils daily.  . furosemide (LASIX) 20 MG tablet Take 1 tablet (20 mg total) by mouth daily.  Marland Kitchen glucose blood (ACCU-CHEK  AVIVA) test strip Twice daily (please use brand that patient insurance covers)  . Hypromellose (ARTIFICIAL TEARS OP) Place 1 drop into both eyes daily as needed (dry eyes).   Marland Kitchen levofloxacin (LEVAQUIN) 500 MG tablet Take 1 tablet (500 mg total) by mouth daily.  Marland Kitchen loratadine (CLARITIN) 10 MG tablet Take 1 tablet (10 mg total) by mouth daily.  Marland Kitchen losartan (COZAAR) 100 MG tablet Take 1 tablet (100 mg total) by mouth daily.  . metFORMIN (GLUCOPHAGE) 500 MG tablet Take 1 tablet (500 mg total) by mouth 2 (two) times daily with a meal.  . montelukast (SINGULAIR) 10 MG tablet Take 1 tablet (10 mg total) by mouth at bedtime.  . promethazine-dextromethorphan (PROMETHAZINE-DM) 6.25-15 MG/5ML syrup Take 5 mLs by mouth 4 (four) times daily as needed.  . rosuvastatin (CRESTOR) 40 MG tablet Take 1 tablet (40 mg total) by mouth daily.  . VENTOLIN HFA 108 (90 Base) MCG/ACT inhaler INHALE 2 PUFFS INTO THE LUNGS EVERY 6 HOURS AS NEEDED FOR WHEEZING INTO RECTUM SHORTNESS OF BREATH  . Vitamin D, Ergocalciferol, (DRISDOL) 1.25 MG (50000 UNIT) CAPS capsule Take 1 capsule (50,000 Units total) by mouth every 7 (seven) days.   No facility-administered encounter medications on file as of 10/18/2019.    Functional Status:  In your present state of health, do you have any difficulty performing the following activities: 09/23/2019 04/02/2019  Hearing? N N  Vision? N N  Difficulty concentrating or making decisions? N N  Walking or climbing stairs? N Y  Comment - knee problems  Dressing or bathing? N N  Doing errands, shopping? N N  Preparing Food and eating ? N N  Using the Toilet? N N  In the past six months, have you accidently leaked urine? N Y  Comment - some incontinence at times  Do you have problems with loss of bowel control? N N  Managing your Medications? N N  Managing your Finances? N N  Housekeeping or managing your Housekeeping? N N  Some recent data might be hidden    Fall/Depression Screening: Fall  Risk  10/18/2019 09/23/2019 07/02/2019  Falls in the past year? 0 0 0  Number falls in past yr: - 0 -  Injury with Fall? - 0 -  Comment - - -  Risk for fall due to : - - -  Risk for fall due to: Comment - - -  Follow up - Education provided;Falls prevention discussed -   PHQ 2/9 Scores 10/18/2019 09/23/2019 09/06/2019 04/02/2019 09/18/2018 08/13/2018 07/16/2018  PHQ - 2 Score 0 0 2 0 0 0 2  PHQ- 9 Score - - 9 - - 2 5  Exception Documentation - - - - - - -    Assessment: Patient continues to manage chronic illnesses and benefit from disease management follow up.    Plan:  [facility] CM Care Plan Problem One     Most Recent Value  Care Plan Problem One  Knowledge deficit related to diabetes managment  Role Documenting the Problem One  Care Management Telephonic Coordinator  Care Plan for Problem One  Active  [facility] Long Term Goal   In 90 days the patient will maintain an a1c of 6.3.  [facility] Long Term Goal Start Date  10/18/19  Interventions for Problem One Long Term Goal  Patient most recent A1c was 7.0.  Discussed with patient diabetic control.  Importance of limiting carbohydrates and sugars to control diabetes.       RN CM will contact patient in the month of July and patient agreeable.    Jone Baseman, RN, MSN [place] [facility] Care Management Coordinator Direct Line [phone] Cell [phone] Toll Free: [phone]  Fax: [phone]

## 2019-10-21 ENCOUNTER — Ambulatory Visit: Payer: Self-pay

## 2019-10-21 DIAGNOSIS — G4733 Obstructive sleep apnea (adult) (pediatric): Secondary | ICD-10-CM | POA: Diagnosis not present

## 2019-10-21 DIAGNOSIS — Z96651 Presence of right artificial knee joint: Secondary | ICD-10-CM | POA: Diagnosis not present

## 2019-11-20 DIAGNOSIS — G4733 Obstructive sleep apnea (adult) (pediatric): Secondary | ICD-10-CM | POA: Diagnosis not present

## 2019-11-20 DIAGNOSIS — Z96651 Presence of right artificial knee joint: Secondary | ICD-10-CM | POA: Diagnosis not present

## 2019-12-06 DIAGNOSIS — Z9049 Acquired absence of other specified parts of digestive tract: Secondary | ICD-10-CM | POA: Diagnosis not present

## 2019-12-06 DIAGNOSIS — R519 Headache, unspecified: Secondary | ICD-10-CM | POA: Diagnosis not present

## 2019-12-06 DIAGNOSIS — Z79899 Other long term (current) drug therapy: Secondary | ICD-10-CM | POA: Diagnosis not present

## 2019-12-06 DIAGNOSIS — Z20822 Contact with and (suspected) exposure to covid-19: Secondary | ICD-10-CM | POA: Diagnosis not present

## 2019-12-06 DIAGNOSIS — I10 Essential (primary) hypertension: Secondary | ICD-10-CM | POA: Diagnosis not present

## 2019-12-06 DIAGNOSIS — G9689 Other specified disorders of central nervous system: Secondary | ICD-10-CM | POA: Diagnosis not present

## 2019-12-06 DIAGNOSIS — J0130 Acute sphenoidal sinusitis, unspecified: Secondary | ICD-10-CM | POA: Diagnosis not present

## 2019-12-06 DIAGNOSIS — J320 Chronic maxillary sinusitis: Secondary | ICD-10-CM | POA: Diagnosis not present

## 2019-12-06 DIAGNOSIS — Z853 Personal history of malignant neoplasm of breast: Secondary | ICD-10-CM | POA: Diagnosis not present

## 2019-12-06 DIAGNOSIS — Z7984 Long term (current) use of oral hypoglycemic drugs: Secondary | ICD-10-CM | POA: Diagnosis not present

## 2019-12-06 DIAGNOSIS — E119 Type 2 diabetes mellitus without complications: Secondary | ICD-10-CM | POA: Diagnosis not present

## 2019-12-06 DIAGNOSIS — E7800 Pure hypercholesterolemia, unspecified: Secondary | ICD-10-CM | POA: Diagnosis not present

## 2019-12-06 DIAGNOSIS — R509 Fever, unspecified: Secondary | ICD-10-CM | POA: Diagnosis not present

## 2019-12-06 DIAGNOSIS — A89 Unspecified viral infection of central nervous system: Secondary | ICD-10-CM | POA: Diagnosis not present

## 2019-12-06 DIAGNOSIS — Z7982 Long term (current) use of aspirin: Secondary | ICD-10-CM | POA: Diagnosis not present

## 2019-12-07 ENCOUNTER — Other Ambulatory Visit: Payer: Self-pay

## 2019-12-07 ENCOUNTER — Telehealth: Payer: Medicare Other | Admitting: Family Medicine

## 2019-12-07 DIAGNOSIS — G9389 Other specified disorders of brain: Secondary | ICD-10-CM | POA: Diagnosis not present

## 2019-12-07 DIAGNOSIS — Z7982 Long term (current) use of aspirin: Secondary | ICD-10-CM | POA: Diagnosis not present

## 2019-12-07 DIAGNOSIS — Z853 Personal history of malignant neoplasm of breast: Secondary | ICD-10-CM | POA: Diagnosis not present

## 2019-12-07 DIAGNOSIS — G930 Cerebral cysts: Secondary | ICD-10-CM | POA: Diagnosis not present

## 2019-12-07 DIAGNOSIS — Z96651 Presence of right artificial knee joint: Secondary | ICD-10-CM | POA: Diagnosis not present

## 2019-12-07 DIAGNOSIS — J90 Pleural effusion, not elsewhere classified: Secondary | ICD-10-CM | POA: Diagnosis not present

## 2019-12-07 DIAGNOSIS — D332 Benign neoplasm of brain, unspecified: Secondary | ICD-10-CM | POA: Diagnosis not present

## 2019-12-07 DIAGNOSIS — Z7984 Long term (current) use of oral hypoglycemic drugs: Secondary | ICD-10-CM | POA: Diagnosis not present

## 2019-12-07 DIAGNOSIS — Z95828 Presence of other vascular implants and grafts: Secondary | ICD-10-CM | POA: Diagnosis not present

## 2019-12-07 DIAGNOSIS — G4733 Obstructive sleep apnea (adult) (pediatric): Secondary | ICD-10-CM | POA: Diagnosis not present

## 2019-12-07 DIAGNOSIS — R519 Headache, unspecified: Secondary | ICD-10-CM | POA: Diagnosis not present

## 2019-12-07 DIAGNOSIS — G92 Toxic encephalopathy: Secondary | ICD-10-CM | POA: Diagnosis not present

## 2019-12-07 DIAGNOSIS — Z9049 Acquired absence of other specified parts of digestive tract: Secondary | ICD-10-CM | POA: Diagnosis not present

## 2019-12-07 DIAGNOSIS — I110 Hypertensive heart disease with heart failure: Secondary | ICD-10-CM | POA: Diagnosis not present

## 2019-12-07 DIAGNOSIS — R291 Meningismus: Secondary | ICD-10-CM | POA: Diagnosis not present

## 2019-12-07 DIAGNOSIS — G319 Degenerative disease of nervous system, unspecified: Secondary | ICD-10-CM | POA: Diagnosis not present

## 2019-12-07 DIAGNOSIS — J9621 Acute and chronic respiratory failure with hypoxia: Secondary | ICD-10-CM | POA: Diagnosis not present

## 2019-12-07 DIAGNOSIS — E119 Type 2 diabetes mellitus without complications: Secondary | ICD-10-CM | POA: Diagnosis not present

## 2019-12-07 DIAGNOSIS — K219 Gastro-esophageal reflux disease without esophagitis: Secondary | ICD-10-CM | POA: Diagnosis not present

## 2019-12-07 DIAGNOSIS — I672 Cerebral atherosclerosis: Secondary | ICD-10-CM | POA: Diagnosis not present

## 2019-12-07 DIAGNOSIS — J320 Chronic maxillary sinusitis: Secondary | ICD-10-CM | POA: Diagnosis not present

## 2019-12-07 DIAGNOSIS — E7800 Pure hypercholesterolemia, unspecified: Secondary | ICD-10-CM | POA: Diagnosis not present

## 2019-12-07 DIAGNOSIS — I272 Pulmonary hypertension, unspecified: Secondary | ICD-10-CM | POA: Diagnosis not present

## 2019-12-07 DIAGNOSIS — Z09 Encounter for follow-up examination after completed treatment for conditions other than malignant neoplasm: Secondary | ICD-10-CM | POA: Diagnosis not present

## 2019-12-07 DIAGNOSIS — I10 Essential (primary) hypertension: Secondary | ICD-10-CM | POA: Diagnosis not present

## 2019-12-07 DIAGNOSIS — R0602 Shortness of breath: Secondary | ICD-10-CM | POA: Diagnosis not present

## 2019-12-07 DIAGNOSIS — J811 Chronic pulmonary edema: Secondary | ICD-10-CM | POA: Diagnosis not present

## 2019-12-07 DIAGNOSIS — E785 Hyperlipidemia, unspecified: Secondary | ICD-10-CM | POA: Diagnosis not present

## 2019-12-07 DIAGNOSIS — A89 Unspecified viral infection of central nervous system: Secondary | ICD-10-CM | POA: Diagnosis not present

## 2019-12-07 DIAGNOSIS — K0889 Other specified disorders of teeth and supporting structures: Secondary | ICD-10-CM | POA: Diagnosis not present

## 2019-12-07 DIAGNOSIS — E872 Acidosis: Secondary | ICD-10-CM | POA: Diagnosis not present

## 2019-12-07 DIAGNOSIS — S066X0A Traumatic subarachnoid hemorrhage without loss of consciousness, initial encounter: Secondary | ICD-10-CM | POA: Diagnosis not present

## 2019-12-07 DIAGNOSIS — E049 Nontoxic goiter, unspecified: Secondary | ICD-10-CM | POA: Diagnosis not present

## 2019-12-07 DIAGNOSIS — I517 Cardiomegaly: Secondary | ICD-10-CM | POA: Diagnosis not present

## 2019-12-07 DIAGNOSIS — I5033 Acute on chronic diastolic (congestive) heart failure: Secondary | ICD-10-CM | POA: Diagnosis not present

## 2019-12-07 DIAGNOSIS — R918 Other nonspecific abnormal finding of lung field: Secondary | ICD-10-CM | POA: Diagnosis not present

## 2019-12-07 DIAGNOSIS — J9811 Atelectasis: Secondary | ICD-10-CM | POA: Diagnosis not present

## 2019-12-07 DIAGNOSIS — G8929 Other chronic pain: Secondary | ICD-10-CM | POA: Diagnosis not present

## 2019-12-07 DIAGNOSIS — Z743 Need for continuous supervision: Secondary | ICD-10-CM | POA: Diagnosis not present

## 2019-12-07 DIAGNOSIS — Z4682 Encounter for fitting and adjustment of non-vascular catheter: Secondary | ICD-10-CM | POA: Diagnosis not present

## 2019-12-07 DIAGNOSIS — Z79899 Other long term (current) drug therapy: Secondary | ICD-10-CM | POA: Diagnosis not present

## 2019-12-07 DIAGNOSIS — G9600 Cerebrospinal fluid leak, unspecified: Secondary | ICD-10-CM | POA: Diagnosis not present

## 2019-12-07 DIAGNOSIS — G9601 Cranial cerebrospinal fluid leak, spontaneous: Secondary | ICD-10-CM | POA: Diagnosis not present

## 2019-12-07 DIAGNOSIS — Q019 Encephalocele, unspecified: Secondary | ICD-10-CM | POA: Diagnosis not present

## 2019-12-07 DIAGNOSIS — G039 Meningitis, unspecified: Secondary | ICD-10-CM | POA: Diagnosis not present

## 2019-12-07 DIAGNOSIS — J343 Hypertrophy of nasal turbinates: Secondary | ICD-10-CM | POA: Diagnosis not present

## 2019-12-07 DIAGNOSIS — Z452 Encounter for adjustment and management of vascular access device: Secondary | ICD-10-CM | POA: Diagnosis not present

## 2019-12-07 DIAGNOSIS — Q018 Encephalocele of other sites: Secondary | ICD-10-CM | POA: Diagnosis not present

## 2019-12-07 DIAGNOSIS — I4891 Unspecified atrial fibrillation: Secondary | ICD-10-CM | POA: Diagnosis not present

## 2019-12-07 DIAGNOSIS — G935 Compression of brain: Secondary | ICD-10-CM | POA: Diagnosis not present

## 2019-12-07 DIAGNOSIS — I5021 Acute systolic (congestive) heart failure: Secondary | ICD-10-CM | POA: Diagnosis not present

## 2019-12-07 DIAGNOSIS — Z978 Presence of other specified devices: Secondary | ICD-10-CM | POA: Diagnosis not present

## 2019-12-07 DIAGNOSIS — J3489 Other specified disorders of nose and nasal sinuses: Secondary | ICD-10-CM | POA: Diagnosis not present

## 2019-12-07 DIAGNOSIS — E348 Other specified endocrine disorders: Secondary | ICD-10-CM | POA: Diagnosis not present

## 2019-12-07 DIAGNOSIS — Z9889 Other specified postprocedural states: Secondary | ICD-10-CM | POA: Diagnosis not present

## 2019-12-07 DIAGNOSIS — J323 Chronic sphenoidal sinusitis: Secondary | ICD-10-CM | POA: Diagnosis not present

## 2019-12-07 DIAGNOSIS — I609 Nontraumatic subarachnoid hemorrhage, unspecified: Secondary | ICD-10-CM | POA: Diagnosis not present

## 2019-12-07 DIAGNOSIS — J329 Chronic sinusitis, unspecified: Secondary | ICD-10-CM | POA: Diagnosis not present

## 2019-12-07 DIAGNOSIS — J342 Deviated nasal septum: Secondary | ICD-10-CM | POA: Diagnosis not present

## 2019-12-07 DIAGNOSIS — J0130 Acute sphenoidal sinusitis, unspecified: Secondary | ICD-10-CM | POA: Diagnosis not present

## 2019-12-07 DIAGNOSIS — T17900A Unspecified foreign body in respiratory tract, part unspecified causing asphyxiation, initial encounter: Secondary | ICD-10-CM | POA: Diagnosis not present

## 2019-12-07 DIAGNOSIS — R509 Fever, unspecified: Secondary | ICD-10-CM | POA: Diagnosis not present

## 2019-12-07 DIAGNOSIS — S0990XA Unspecified injury of head, initial encounter: Secondary | ICD-10-CM | POA: Diagnosis not present

## 2019-12-10 ENCOUNTER — Telehealth: Payer: Self-pay | Admitting: Cardiology

## 2019-12-10 NOTE — Telephone Encounter (Signed)
    Dr. Elyse Jarvis from [facility] called and requesting to get copy of pt's medical records, pt's Afib and heart issue history. She gave fax# [phone]

## 2019-12-15 ENCOUNTER — Ambulatory Visit: Payer: Medicare HMO | Admitting: Internal Medicine

## 2019-12-30 ENCOUNTER — Telehealth: Payer: Self-pay | Admitting: Internal Medicine

## 2019-12-30 DIAGNOSIS — G9600 Cerebrospinal fluid leak, unspecified: Secondary | ICD-10-CM

## 2019-12-30 DIAGNOSIS — G4734 Idiopathic sleep related nonobstructive alveolar hypoventilation: Secondary | ICD-10-CM

## 2019-12-30 NOTE — Telephone Encounter (Signed)
Order- overnight oximetry on room air, off CPAP.   Dx CSF (cerebrospinal fluid leak), nocturnal hypoxemia

## 2019-12-30 NOTE — Telephone Encounter (Signed)
We can check with DME that had provided her CPAP, but without records from hosp where she was treated for CSF leak, and without documentation that she qualifies for home O2, I don't think DME will be willing to provide O2. We could bring her to see NP here to check resting daytime O2 just in case she would qualify for home O2 that way,

## 2019-12-30 NOTE — Telephone Encounter (Signed)
Order sent and I spoke with the pt's daughter and made aware Order sent to [facility]  They are concerned about her waiting over the weekend with no o2 and want to know if CDY has any other suggestions  Please advise thanks

## 2019-12-30 NOTE — Telephone Encounter (Signed)
Pt daughter states pt was in hospital for 22 days,patient had cerebral spinal fluid leak through nose, had to have a patch done.  Patient is unable to use CPAP due to the forced air. In hospital, they used humidified oxygen(?) with an oxygen mask,not nasal cannula, more like a simple mask. Just at night, patient used between 2 and 4L of oxygen. Wanting to know if we get order something similar. Please advise. [phone]

## 2019-12-30 NOTE — Telephone Encounter (Signed)
Spoke with the pt's daughter  Pt just d/c from the hospital in [place] 2 days ago  She was there for severe HA- they discovered that cerebral spinal fluid had leaked into her sinuses  She has been advised not to use her CPAP for approx 4-6 wks  While in the hospital they had her on 2-4 lpm humidified o2 via face mask  She is wondering about getting the same set up at home  Please advise thanks

## 2019-12-31 DIAGNOSIS — J449 Chronic obstructive pulmonary disease, unspecified: Secondary | ICD-10-CM | POA: Diagnosis not present

## 2019-12-31 DIAGNOSIS — R0902 Hypoxemia: Secondary | ICD-10-CM | POA: Diagnosis not present

## 2019-12-31 NOTE — Telephone Encounter (Signed)
Patient's daughter contacted, ONO orders placed yesterday. [facility] is calling [employer] to request an urgent ONO study. Family will call for any worsening respiratory symptoms. FYI sent to Dr. Annamaria Boots.

## 2020-01-03 ENCOUNTER — Telehealth: Payer: Self-pay | Admitting: Family Medicine

## 2020-01-03 DIAGNOSIS — G4733 Obstructive sleep apnea (adult) (pediatric): Secondary | ICD-10-CM | POA: Diagnosis not present

## 2020-01-03 NOTE — Telephone Encounter (Signed)
Are you ok with order?  Last seen in April.

## 2020-01-03 NOTE — Telephone Encounter (Signed)
I am fine giving the order but I believe it is required that I see her within 30 days for it to get paid for. Please set that up, can be virtual but cannot be phone I believe.

## 2020-01-03 NOTE — Telephone Encounter (Signed)
Verbal order given and she will let patient know that she has to have appointment before she receive services.

## 2020-01-03 NOTE — Telephone Encounter (Signed)
Overnight oximetry on 12/31/19 confirmed that she does qualify for oxygen during sleep.  Please order DME [employer]- Home O2 for sleep 2L    dx nocturnal hypoxemia

## 2020-01-03 NOTE — Telephone Encounter (Signed)
Called and spoke with Daughter let her know order was placed. She verbalized understanding. Nothing further needed at this time

## 2020-01-03 NOTE — Telephone Encounter (Signed)
Open in error

## 2020-01-03 NOTE — Telephone Encounter (Signed)
Caller: [facility]) Call back phone number: [phone]  Need a verbal order for nursing and physical therapy assessment.

## 2020-01-04 ENCOUNTER — Ambulatory Visit: Payer: Medicare Other | Admitting: Medical

## 2020-01-04 ENCOUNTER — Inpatient Hospital Stay: Payer: Medicare Other | Admitting: Medical

## 2020-01-04 DIAGNOSIS — G4733 Obstructive sleep apnea (adult) (pediatric): Secondary | ICD-10-CM | POA: Diagnosis not present

## 2020-01-04 DIAGNOSIS — I5032 Chronic diastolic (congestive) heart failure: Secondary | ICD-10-CM | POA: Diagnosis not present

## 2020-01-05 DIAGNOSIS — E119 Type 2 diabetes mellitus without complications: Secondary | ICD-10-CM | POA: Diagnosis not present

## 2020-01-05 DIAGNOSIS — J0130 Acute sphenoidal sinusitis, unspecified: Secondary | ICD-10-CM | POA: Diagnosis not present

## 2020-01-05 DIAGNOSIS — J323 Chronic sphenoidal sinusitis: Secondary | ICD-10-CM | POA: Diagnosis not present

## 2020-01-05 DIAGNOSIS — Z86011 Personal history of benign neoplasm of the brain: Secondary | ICD-10-CM | POA: Diagnosis not present

## 2020-01-05 DIAGNOSIS — Z853 Personal history of malignant neoplasm of breast: Secondary | ICD-10-CM | POA: Diagnosis not present

## 2020-01-05 DIAGNOSIS — Z483 Aftercare following surgery for neoplasm: Secondary | ICD-10-CM | POA: Diagnosis not present

## 2020-01-05 DIAGNOSIS — Z48811 Encounter for surgical aftercare following surgery on the nervous system: Secondary | ICD-10-CM | POA: Diagnosis not present

## 2020-01-05 DIAGNOSIS — I272 Pulmonary hypertension, unspecified: Secondary | ICD-10-CM | POA: Diagnosis not present

## 2020-01-05 DIAGNOSIS — G4733 Obstructive sleep apnea (adult) (pediatric): Secondary | ICD-10-CM | POA: Diagnosis not present

## 2020-01-05 DIAGNOSIS — I110 Hypertensive heart disease with heart failure: Secondary | ICD-10-CM | POA: Diagnosis not present

## 2020-01-05 DIAGNOSIS — E785 Hyperlipidemia, unspecified: Secondary | ICD-10-CM | POA: Diagnosis not present

## 2020-01-05 DIAGNOSIS — K219 Gastro-esophageal reflux disease without esophagitis: Secondary | ICD-10-CM | POA: Diagnosis not present

## 2020-01-05 DIAGNOSIS — I509 Heart failure, unspecified: Secondary | ICD-10-CM | POA: Diagnosis not present

## 2020-01-05 DIAGNOSIS — Z7984 Long term (current) use of oral hypoglycemic drugs: Secondary | ICD-10-CM | POA: Diagnosis not present

## 2020-01-10 DIAGNOSIS — I110 Hypertensive heart disease with heart failure: Secondary | ICD-10-CM | POA: Diagnosis not present

## 2020-01-10 DIAGNOSIS — Z853 Personal history of malignant neoplasm of breast: Secondary | ICD-10-CM | POA: Diagnosis not present

## 2020-01-10 DIAGNOSIS — Z86011 Personal history of benign neoplasm of the brain: Secondary | ICD-10-CM | POA: Diagnosis not present

## 2020-01-10 DIAGNOSIS — I509 Heart failure, unspecified: Secondary | ICD-10-CM | POA: Diagnosis not present

## 2020-01-10 DIAGNOSIS — E119 Type 2 diabetes mellitus without complications: Secondary | ICD-10-CM | POA: Diagnosis not present

## 2020-01-10 DIAGNOSIS — J323 Chronic sphenoidal sinusitis: Secondary | ICD-10-CM | POA: Diagnosis not present

## 2020-01-10 DIAGNOSIS — Z7984 Long term (current) use of oral hypoglycemic drugs: Secondary | ICD-10-CM | POA: Diagnosis not present

## 2020-01-10 DIAGNOSIS — Z48811 Encounter for surgical aftercare following surgery on the nervous system: Secondary | ICD-10-CM | POA: Diagnosis not present

## 2020-01-10 DIAGNOSIS — I272 Pulmonary hypertension, unspecified: Secondary | ICD-10-CM | POA: Diagnosis not present

## 2020-01-10 DIAGNOSIS — E785 Hyperlipidemia, unspecified: Secondary | ICD-10-CM | POA: Diagnosis not present

## 2020-01-10 DIAGNOSIS — K219 Gastro-esophageal reflux disease without esophagitis: Secondary | ICD-10-CM | POA: Diagnosis not present

## 2020-01-10 DIAGNOSIS — J0130 Acute sphenoidal sinusitis, unspecified: Secondary | ICD-10-CM | POA: Diagnosis not present

## 2020-01-10 DIAGNOSIS — Z483 Aftercare following surgery for neoplasm: Secondary | ICD-10-CM | POA: Diagnosis not present

## 2020-01-10 DIAGNOSIS — G4733 Obstructive sleep apnea (adult) (pediatric): Secondary | ICD-10-CM | POA: Diagnosis not present

## 2020-01-11 ENCOUNTER — Telehealth: Payer: Self-pay | Admitting: Family Medicine

## 2020-01-11 ENCOUNTER — Telehealth: Payer: Self-pay | Admitting: Medical

## 2020-01-11 ENCOUNTER — Other Ambulatory Visit: Payer: Self-pay

## 2020-01-11 DIAGNOSIS — Z86011 Personal history of benign neoplasm of the brain: Secondary | ICD-10-CM | POA: Diagnosis not present

## 2020-01-11 DIAGNOSIS — Z48811 Encounter for surgical aftercare following surgery on the nervous system: Secondary | ICD-10-CM | POA: Diagnosis not present

## 2020-01-11 DIAGNOSIS — G4733 Obstructive sleep apnea (adult) (pediatric): Secondary | ICD-10-CM | POA: Diagnosis not present

## 2020-01-11 DIAGNOSIS — I272 Pulmonary hypertension, unspecified: Secondary | ICD-10-CM | POA: Diagnosis not present

## 2020-01-11 DIAGNOSIS — Z853 Personal history of malignant neoplasm of breast: Secondary | ICD-10-CM | POA: Diagnosis not present

## 2020-01-11 DIAGNOSIS — Z483 Aftercare following surgery for neoplasm: Secondary | ICD-10-CM | POA: Diagnosis not present

## 2020-01-11 DIAGNOSIS — K219 Gastro-esophageal reflux disease without esophagitis: Secondary | ICD-10-CM | POA: Diagnosis not present

## 2020-01-11 DIAGNOSIS — J323 Chronic sphenoidal sinusitis: Secondary | ICD-10-CM | POA: Diagnosis not present

## 2020-01-11 DIAGNOSIS — I110 Hypertensive heart disease with heart failure: Secondary | ICD-10-CM | POA: Diagnosis not present

## 2020-01-11 DIAGNOSIS — I509 Heart failure, unspecified: Secondary | ICD-10-CM | POA: Diagnosis not present

## 2020-01-11 DIAGNOSIS — E119 Type 2 diabetes mellitus without complications: Secondary | ICD-10-CM | POA: Diagnosis not present

## 2020-01-11 DIAGNOSIS — J0130 Acute sphenoidal sinusitis, unspecified: Secondary | ICD-10-CM | POA: Diagnosis not present

## 2020-01-11 DIAGNOSIS — Z7984 Long term (current) use of oral hypoglycemic drugs: Secondary | ICD-10-CM | POA: Diagnosis not present

## 2020-01-11 DIAGNOSIS — E785 Hyperlipidemia, unspecified: Secondary | ICD-10-CM | POA: Diagnosis not present

## 2020-01-11 NOTE — Telephone Encounter (Signed)
[name] home health called for Dr. Charlett Blake: They want to do one week with 3 visits and then a phone visit every other week.  Also needs clarification on amoxicillin, is it 30 or 60 days.  Also, patient has one clear suture in lower back that is not dissolvable. Can home health take it out or does patient need to come back to office for removal? Call back number [phone].

## 2020-01-11 NOTE — Telephone Encounter (Signed)
This is follow up from [place]. States virtual visit. No one ran this by me. I don't want to see for most part video visit unless respiratory type complaint that could be covid.   This is older pt with ha. Complaint states  CNS infection. I want in office so can do appropiate neurologic exam. Majority of times pt often don't have vitals on video visits. So please see if she will come in. Prefer she see someone else if she wants virtual. It tend to have to many issues virtually.  Never want virtual with chest pain, abdomen pain or neurologic issues. Particularly in elderly high risk population.

## 2020-01-11 NOTE — Telephone Encounter (Signed)
[facility] Physical Therapist called to request orders for PT twice weekly x 2 weeks then once weekly x 3 weeks. Her number is [phone] and please leave message if no answer.

## 2020-01-11 NOTE — Patient Outreach (Signed)
[facility]) Care Management  01/11/2020  [name] [birth year] [id number]   Telephone call to patient for disease management follow up. No answer.  HIPAA compliant voice message left.    Plan: RN CM will attempt patient again in the month of October and send letter.  Jone Baseman, RN, MSN [place] [facility] Care Management Coordinator Direct Line [phone] Cell [phone] Toll Free: [phone]  Fax: [phone]

## 2020-01-11 NOTE — Telephone Encounter (Signed)
Dr. Charlett Blake gave verbal to remove suture.    She wanted to know how long patient should be on amoxicillin and I advised her that we have not seen her yet and will review with her tomorrow.    Verbal given for the visits.

## 2020-01-12 ENCOUNTER — Other Ambulatory Visit: Payer: Self-pay

## 2020-01-12 ENCOUNTER — Telehealth: Payer: Medicare Other | Admitting: Medical

## 2020-01-12 DIAGNOSIS — I272 Pulmonary hypertension, unspecified: Secondary | ICD-10-CM | POA: Diagnosis not present

## 2020-01-12 DIAGNOSIS — G4733 Obstructive sleep apnea (adult) (pediatric): Secondary | ICD-10-CM | POA: Diagnosis not present

## 2020-01-12 DIAGNOSIS — Z86011 Personal history of benign neoplasm of the brain: Secondary | ICD-10-CM | POA: Diagnosis not present

## 2020-01-12 DIAGNOSIS — I110 Hypertensive heart disease with heart failure: Secondary | ICD-10-CM | POA: Diagnosis not present

## 2020-01-12 DIAGNOSIS — Z48811 Encounter for surgical aftercare following surgery on the nervous system: Secondary | ICD-10-CM | POA: Diagnosis not present

## 2020-01-12 DIAGNOSIS — E785 Hyperlipidemia, unspecified: Secondary | ICD-10-CM | POA: Diagnosis not present

## 2020-01-12 DIAGNOSIS — J0130 Acute sphenoidal sinusitis, unspecified: Secondary | ICD-10-CM | POA: Diagnosis not present

## 2020-01-12 DIAGNOSIS — Z7984 Long term (current) use of oral hypoglycemic drugs: Secondary | ICD-10-CM | POA: Diagnosis not present

## 2020-01-12 DIAGNOSIS — Z483 Aftercare following surgery for neoplasm: Secondary | ICD-10-CM | POA: Diagnosis not present

## 2020-01-12 DIAGNOSIS — J323 Chronic sphenoidal sinusitis: Secondary | ICD-10-CM | POA: Diagnosis not present

## 2020-01-12 DIAGNOSIS — E119 Type 2 diabetes mellitus without complications: Secondary | ICD-10-CM | POA: Diagnosis not present

## 2020-01-12 DIAGNOSIS — Z853 Personal history of malignant neoplasm of breast: Secondary | ICD-10-CM | POA: Diagnosis not present

## 2020-01-12 DIAGNOSIS — I509 Heart failure, unspecified: Secondary | ICD-10-CM | POA: Diagnosis not present

## 2020-01-12 DIAGNOSIS — K219 Gastro-esophageal reflux disease without esophagitis: Secondary | ICD-10-CM | POA: Diagnosis not present

## 2020-01-12 NOTE — Telephone Encounter (Signed)
FYI Patient scheduled for virtual visit for tomorrow with [name].

## 2020-01-12 NOTE — Telephone Encounter (Signed)
Verbal order given to Mickel Baas

## 2020-01-12 NOTE — Telephone Encounter (Signed)
Spoke with patient and stated they would prefer virtual due to just having surgery on June 21 and she was released from the hospital on 7/5 , their primary concern is not the infection but primary care moving forward and needing a referral .

## 2020-01-12 NOTE — Telephone Encounter (Signed)
Can pt see pcp tomorrow/virtual if she has ongoing primary care concerns. Looks like opening.   Want to be helpful but not fan of virtual visits except for respiratory/potential covid concerns.

## 2020-01-13 ENCOUNTER — Encounter: Payer: Self-pay | Admitting: Family Medicine

## 2020-01-13 ENCOUNTER — Other Ambulatory Visit: Payer: Self-pay

## 2020-01-13 ENCOUNTER — Telehealth (INDEPENDENT_AMBULATORY_CARE_PROVIDER_SITE_OTHER): Payer: Medicare Other | Admitting: Family Medicine

## 2020-01-13 DIAGNOSIS — Z48811 Encounter for surgical aftercare following surgery on the nervous system: Secondary | ICD-10-CM | POA: Diagnosis not present

## 2020-01-13 DIAGNOSIS — F419 Anxiety disorder, unspecified: Secondary | ICD-10-CM

## 2020-01-13 DIAGNOSIS — I10 Essential (primary) hypertension: Secondary | ICD-10-CM

## 2020-01-13 DIAGNOSIS — J0130 Acute sphenoidal sinusitis, unspecified: Secondary | ICD-10-CM | POA: Diagnosis not present

## 2020-01-13 DIAGNOSIS — Z853 Personal history of malignant neoplasm of breast: Secondary | ICD-10-CM | POA: Diagnosis not present

## 2020-01-13 DIAGNOSIS — F32A Depression, unspecified: Secondary | ICD-10-CM

## 2020-01-13 DIAGNOSIS — J323 Chronic sphenoidal sinusitis: Secondary | ICD-10-CM | POA: Diagnosis not present

## 2020-01-13 DIAGNOSIS — Z86011 Personal history of benign neoplasm of the brain: Secondary | ICD-10-CM | POA: Diagnosis not present

## 2020-01-13 DIAGNOSIS — I272 Pulmonary hypertension, unspecified: Secondary | ICD-10-CM | POA: Diagnosis not present

## 2020-01-13 DIAGNOSIS — G4733 Obstructive sleep apnea (adult) (pediatric): Secondary | ICD-10-CM

## 2020-01-13 DIAGNOSIS — G9601 Cranial cerebrospinal fluid leak, spontaneous: Secondary | ICD-10-CM

## 2020-01-13 DIAGNOSIS — Z7984 Long term (current) use of oral hypoglycemic drugs: Secondary | ICD-10-CM | POA: Diagnosis not present

## 2020-01-13 DIAGNOSIS — E119 Type 2 diabetes mellitus without complications: Secondary | ICD-10-CM | POA: Diagnosis not present

## 2020-01-13 DIAGNOSIS — I110 Hypertensive heart disease with heart failure: Secondary | ICD-10-CM | POA: Diagnosis not present

## 2020-01-13 DIAGNOSIS — E7800 Pure hypercholesterolemia, unspecified: Secondary | ICD-10-CM | POA: Diagnosis not present

## 2020-01-13 DIAGNOSIS — F329 Major depressive disorder, single episode, unspecified: Secondary | ICD-10-CM

## 2020-01-13 DIAGNOSIS — K219 Gastro-esophageal reflux disease without esophagitis: Secondary | ICD-10-CM | POA: Diagnosis not present

## 2020-01-13 DIAGNOSIS — I509 Heart failure, unspecified: Secondary | ICD-10-CM | POA: Diagnosis not present

## 2020-01-13 DIAGNOSIS — Z483 Aftercare following surgery for neoplasm: Secondary | ICD-10-CM | POA: Diagnosis not present

## 2020-01-13 DIAGNOSIS — E785 Hyperlipidemia, unspecified: Secondary | ICD-10-CM | POA: Diagnosis not present

## 2020-01-13 NOTE — Assessment & Plan Note (Signed)
Well controlled, no changes to meds. Encouraged heart healthy diet such as the DASH diet and exercise as tolerated.  °

## 2020-01-13 NOTE — Assessment & Plan Note (Signed)
She has had to stop

## 2020-01-13 NOTE — Assessment & Plan Note (Signed)
Encouraged heart healthy diet, increase exercise, avoid trans fats, consider a krill oil cap daily 

## 2020-01-13 NOTE — Assessment & Plan Note (Addendum)
From right nostril and she has had a surgery done by Dr Candiss Norse to repair at [facility]. She is referred to Dr Bradd Burner of [facility] to have the packing removed at her request if she develops complications she will seek care sooner. Spent 30 minutes discussing history, care and plan.

## 2020-01-13 NOTE — Progress Notes (Signed)
69 

## 2020-01-14 NOTE — Assessment & Plan Note (Signed)
Has been stressed but is managing fairly well considering everything she hs gone through. Continue Lexapro 20 mg daily

## 2020-01-14 NOTE — Progress Notes (Addendum)
[name], female   DOB: Jul 16, 1950, 69 y.o.   MRN: [id number]  Virtual Visit via Video Note  I connected with Wilfrid Lund on 01/13/20 at 11:20 AM EDT by a video enabled telemedicine application and verified that I am speaking with the correct person using two identifiers.  Location: Patient: home, patient, her daughter and provider were in visit.  Provider: work   I discussed the limitations of evaluation and management by telemedicine and the availability of in person appointments. The patient expressed understanding and agreed to proceed. Thelma Barge, CMA was able to get the patient set up on a video visit    Subjective:    [name], female    DOB: 02-14-1951, 69 y.o.   MRN: [id number]  No chief complaint on file.   HPI Patient is in today for follow up after a recent hospital visit. From right nostril and she has had a surgery done by Dr Thedore Mins to repair at [facility]. She is home with her daughter and getting PT/OT and slowly  getting better and stronger. Her leak has improved but she feels the packing in her right nostril and that is uncomfortable. No fevers or chills. Denies CP/palp/SOB/HA/congestion/fevers/GI or GU c/o. Taking meds as prescribed  Past Medical History:  Diagnosis Date  . Anemia 04/05/2012  . Anxiety   . Anxiety state 09/11/2007   Qualifier: Diagnosis of  By: Everett Graff    . Asthma   . Atrial tachycardia, paroxysmal (HCC)   . Back pain 10/02/2014  . Breast cancer (HCC) 02/17/2017  . Chicken pox as a child  . Chronic diastolic CHF (congestive heart failure), NYHA class 1 (HCC)   . Complication of anesthesia    pt states feels different in her body after anesthesia when waking up and also experiences a smell of burnt plastic for approx a wk   . Constipation   . Depression   . Diabetes (HCC)   . Dilated aortic root (HCC)    47mm by echo 06/2019  . Dysuria 02/17/2017  . Elevated LFTs  04/05/2012  . GERD (gastroesophageal reflux disease)   . Goiter   . Heart murmur    hx of one at birth   . History of kidney stones   . History of radiation therapy 10/31/16-12/17/16   left breast 45 Gy in 25 fractions, left breast boost 16 Gy in 8 fractions  . Hx of colonic polyps   . Hyperlipidemia   . Hypertension   . Insomnia 10/08/2016  . Joint pain   . Malignant neoplasm of upper-outer quadrant of left female breast (HCC) 05/14/2016   not with patient  . Measles as a child  . Mumps as a child  . OA (osteoarthritis) of knee 04/05/2012  . Obesity 07/11/2014  . PONV (postoperative nausea and vomiting)   . Preventative health care 09/05/2013  . PVC's (premature ventricular contractions) 05/02/2016  . Shortness of breath dyspnea    walking distances / climbing stairs  . Sleep apnea 04/05/2012  . Tinea corporis 02/23/2013  . Type 2 diabetes mellitus with hyperglycemia (HCC) 12/31/2013  . Vasomotor rhinitis 04/05/2012  . Ventral hernia   . Wheezing     Past Surgical History:  Procedure Laterality Date  . BREAST LUMPECTOMY Left 2017  . BREAST LUMPECTOMY WITH RADIOACTIVE SEED AND SENTINEL LYMPH NODE BIOPSY Left 06/21/2016   Procedure: LEFT BREAST LUMPECTOMY WITH RADIOACTIVE SEED AND SENTINEL LYMPH NODE BIOPSY, INJECT BLUE DYE  LEFT BREAST;  Surgeon: Fanny Skates, MD;  Location: [place];  Service: General;  Laterality: Left;  . CARDIAC CATHETERIZATION     normal coroary arteries per patient  . CARDIOVASCULAR STRESS TEST     10/12/2013  . CESAREAN SECTION     X 3  . CHOLECYSTECTOMY    . COLONOSCOPY WITH PROPOFOL N/A 03/28/2015   Procedure: COLONOSCOPY WITH PROPOFOL;  Surgeon: Juanita Craver, MD;  Location: [facility];  Service: Endoscopy;  Laterality: N/A;  . HERNIA REPAIR  02/08/11   ventral hernia  . JOINT REPLACEMENT     bilateral  . KNEE ARTHROSCOPY  05/2010   bilateral  . LEFT AND RIGHT HEART CATHETERIZATION WITH CORONARY ANGIOGRAM N/A 11/08/2013   Procedure: LEFT AND RIGHT  HEART CATHETERIZATION WITH CORONARY ANGIOGRAM;  Surgeon: [name], MD;  Location: [facility] CATH LAB;  Service: Cardiovascular;  Laterality: N/A;  . MENISCUS REPAIR  2009  . MOUTH SURGERY     teeth implants  . PORT-A-CATH REMOVAL N/A 01/24/2017   Procedure: REMOVAL PORT-A-CATH;  Surgeon: Fanny Skates, MD;  Location: [place];  Service: General;  Laterality: N/A;  . PORTACATH PLACEMENT Right 07/16/2016   Procedure: INSERTION PORT-A-CATH RIGHT INTERNAL JUGULAR WITH ULTRASOUND;  Surgeon: Fanny Skates, MD;  Location: [place];  Service: General;  Laterality: Right;  . TOTAL KNEE ARTHROPLASTY  2011   left  . TOTAL KNEE ARTHROPLASTY Right 05/10/2014   Procedure: RIGHT TOTAL KNEE ARTHROPLASTY;  Surgeon: Mauri Pole, MD;  Location: [facility];  Service: Orthopedics;  Laterality: Right;  . TUBAL LIGATION    . WISDOM TOOTH EXTRACTION  2000    Family History  Problem Relation Age of Onset  . Thyroid disease Mother   . Hyperlipidemia Mother   . Depression Mother   . Anxiety disorder Mother   . Heart attack Father 22  . Hypertension Father   . Arthritis Father        RA  . Coronary artery disease Father   . High Cholesterol Father   . Coronary artery disease Brother   . Heart disease Brother   . Cancer Maternal Aunt        colon  . Cancer Maternal Grandmother        colon  . Heart attack Maternal Grandfather   . Alcohol abuse Paternal Grandfather     Social History   Socioeconomic History  . Marital status: Divorced    Spouse name: Not on file  . Number of children: 3  . Years of education: Not on file  . Highest education level: Not on file  Occupational History  . Occupation: retired - [employer]  Tobacco Use  . Smoking status: Never Smoker  . Smokeless tobacco: Never Used  Vaping Use  . Vaping Use: Never used  Substance and Sexual Activity  . Alcohol use: Not Currently    Comment: rarely  . Drug use: No  . Sexual activity: Never    Comment: lives with mother  currently-stress  Other Topics Concern  . Not on file  Social History Narrative  . Not on file   Social Determinants of Health   Financial Resource Strain: Low Risk   . Difficulty of Paying Living Expenses: Not hard at all  Food Insecurity: No Food Insecurity  . Worried About Charity fundraiser in the Last Year: Never true  . Ran Out of Food in the Last Year: Never true  Transportation Needs: No Transportation Needs  . Lack of Transportation (Medical):  No  . Lack of Transportation (Non-Medical): No  Physical Activity:   . Days of Exercise per Week:   . Minutes of Exercise per Session:   Stress: No Stress Concern Present  . Feeling of Stress : Not at all  Social Connections:   . Frequency of Communication with Friends and Family:   . Frequency of Social Gatherings with Friends and Family:   . Attends Religious Services:   . Active Member of Clubs or Organizations:   . Attends Archivist Meetings:   Marland Kitchen Marital Status:   Intimate Partner Violence:   . Fear of Current or Ex-Partner:   . Emotionally Abused:   Marland Kitchen Physically Abused:   . Sexually Abused:     Outpatient Medications Prior to Visit  Medication Sig Dispense Refill  . acetaminophen (TYLENOL) 500 MG tablet Take 1,000 mg by mouth 2 (two) times daily as needed for moderate pain or headache.    Marland Kitchen amoxicillin (AMOXIL) 500 MG capsule Take 500 mg by mouth 2 (two) times daily.    Marland Kitchen anastrozole (ARIMIDEX) 1 MG tablet Take 1 tablet (1 mg total) by mouth daily. 90 tablet 3  . BREO ELLIPTA 100-25 MCG/INH AEPB INHALE 1 PUFF INTO LUNGS ONCE A DAY 60 each 3  . carvedilol (COREG) 6.25 MG tablet Take 1 tablet (6.25 mg total) by mouth 2 (two) times daily. 180 tablet 1  . diltiazem (CARDIZEM CD) 120 MG 24 hr capsule Take 1 capsule (120 mg total) by mouth daily. 90 capsule 3  . Docusate Calcium (STOOL SOFTENER PO) Take by mouth daily.    Marland Kitchen escitalopram (LEXAPRO) 20 MG tablet Take 20 mg po in am and 10 mg po in pm 135 tablet 1  .  esomeprazole (NEXIUM) 40 MG capsule Take 1 capsule (40 mg total) by mouth daily. 90 capsule 3  . ezetimibe (ZETIA) 10 MG tablet Take 1 tablet (10 mg total) by mouth daily. 90 tablet 3  . fluticasone (FLONASE) 50 MCG/ACT nasal spray Place 2 sprays into both nostrils daily. 16 g 6  . furosemide (LASIX) 20 MG tablet Take 1 tablet (20 mg total) by mouth daily. 90 tablet 3  . glucose blood (ACCU-CHEK AVIVA) test strip Twice daily (please use brand that patient insurance covers) 100 each 5  . Hypromellose (ARTIFICIAL TEARS OP) Place 1 drop into both eyes daily as needed (dry eyes).     Marland Kitchen loratadine (CLARITIN) 10 MG tablet Take 1 tablet (10 mg total) by mouth daily. 30 tablet 11  . losartan (COZAAR) 100 MG tablet Take 1 tablet (100 mg total) by mouth daily. 90 tablet 3  . meclizine (ANTIVERT) 25 MG tablet Take 25 mg by mouth every 6 (six) hours as needed.    . metFORMIN (GLUCOPHAGE) 500 MG tablet Take 1 tablet (500 mg total) by mouth 2 (two) times daily with a meal. 180 tablet 1  . montelukast (SINGULAIR) 10 MG tablet Take 1 tablet (10 mg total) by mouth at bedtime. 90 tablet 3  . promethazine-dextromethorphan (PROMETHAZINE-DM) 6.25-15 MG/5ML syrup Take 5 mLs by mouth 4 (four) times daily as needed. 118 mL 0  . rosuvastatin (CRESTOR) 40 MG tablet Take 1 tablet (40 mg total) by mouth daily. 90 tablet 3  . VENTOLIN HFA 108 (90 Base) MCG/ACT inhaler INHALE 2 PUFFS INTO THE LUNGS EVERY 6 HOURS AS NEEDED FOR WHEEZING INTO RECTUM SHORTNESS OF BREATH 18 g 12  . Vitamin D, Ergocalciferol, (DRISDOL) 1.25 MG (50000 UNIT) CAPS capsule Take 1 capsule (  50,000 Units total) by mouth every 7 (seven) days. 4 capsule 4  . aspirin EC 81 MG tablet Take 81 mg by mouth at bedtime.     . diclofenac (VOLTAREN) 75 MG EC tablet Take 1 tablet (75 mg total) by mouth 2 (two) times daily. 180 tablet 1  . levofloxacin (LEVAQUIN) 500 MG tablet Take 1 tablet (500 mg total) by mouth daily. 7 tablet 0   No facility-administered  medications prior to visit.    Allergies  Allergen Reactions  . No Known Allergies     Review of Systems  Constitutional: Positive for malaise/fatigue. Negative for fever.  HENT: Positive for congestion and sinus pain.   Eyes: Negative for blurred vision.  Respiratory: Negative for shortness of breath.   Cardiovascular: Negative for chest pain, palpitations and leg swelling.  Gastrointestinal: Negative for abdominal pain, blood in stool and nausea.  Genitourinary: Negative for dysuria and frequency.  Musculoskeletal: Negative for falls.  Skin: Negative for rash.  Neurological: Negative for dizziness, loss of consciousness and headaches.  Endo/Heme/Allergies: Negative for environmental allergies.  Psychiatric/Behavioral: Negative for depression. The patient is nervous/anxious.        Objective:    Physical Exam Constitutional:      Appearance: Normal appearance. She is not ill-appearing.  HENT:     Head: Normocephalic and atraumatic.     Right Ear: External ear normal.     Left Ear: External ear normal.     Nose: Nose normal.  Eyes:     General:        Right eye: No discharge.  Pulmonary:     Effort: Pulmonary effort is normal.  Neurological:     Mental Status: She is alert and oriented to person, place, and time.  Psychiatric:        Behavior: Behavior normal.     There were no vitals taken for this visit. Wt Readings from Last 3 Encounters:  09/06/19 294 lb (133.4 kg)  07/02/19 280 lb (127 kg)  04/16/19 275 lb (124.7 kg)    Diabetic Foot Exam - Simple   No data filed     Lab Results  Component Value Date   WBC 8.2 09/06/2019   HGB 13.4 09/06/2019   HCT 41.3 09/06/2019   PLT 267.0 09/06/2019   GLUCOSE 123 (H) 09/06/2019   CHOL 127 09/06/2019   TRIG 190.0 (H) 09/06/2019   HDL 31.70 (L) 09/06/2019   LDLDIRECT 108.0 10/08/2016   LDLCALC 57 09/06/2019   ALT 17 09/06/2019   AST 12 09/06/2019   NA 140 09/06/2019   K 4.6 09/06/2019   CL 102 09/06/2019    CREATININE 0.88 09/06/2019   BUN 23 09/06/2019   CO2 29 09/06/2019   TSH 1.84 09/06/2019   INR 1.01 05/03/2014   HGBA1C 7.0 (H) 09/06/2019    Lab Results  Component Value Date   TSH 1.84 09/06/2019   Lab Results  Component Value Date   WBC 8.2 09/06/2019   HGB 13.4 09/06/2019   HCT 41.3 09/06/2019   MCV 84.0 09/06/2019   PLT 267.0 09/06/2019   Lab Results  Component Value Date   NA 140 09/06/2019   K 4.6 09/06/2019   CHLORIDE 107 04/11/2017   CO2 29 09/06/2019   GLUCOSE 123 (H) 09/06/2019   BUN 23 09/06/2019   CREATININE 0.88 09/06/2019   BILITOT 0.6 09/06/2019   ALKPHOS 103 09/06/2019   AST 12 09/06/2019   ALT 17 09/06/2019   PROT 7.2 09/06/2019  ALBUMIN 4.1 09/06/2019   CALCIUM 10.0 09/06/2019   ANIONGAP 10 10/06/2017   EGFR >60 04/11/2017   GFR 63.75 09/06/2019   Lab Results  Component Value Date   CHOL 127 09/06/2019   Lab Results  Component Value Date   HDL 31.70 (L) 09/06/2019   Lab Results  Component Value Date   LDLCALC 57 09/06/2019   Lab Results  Component Value Date   TRIG 190.0 (H) 09/06/2019   Lab Results  Component Value Date   CHOLHDL 4 09/06/2019   Lab Results  Component Value Date   HGBA1C 7.0 (H) 09/06/2019       Assessment & Plan:   Problem List Items Addressed This Visit    Hyperlipidemia    Encouraged heart healthy diet, increase exercise, avoid trans fats, consider a krill oil cap daily      Anxiety and depression    Has been stressed but is managing fairly well considering everything she hs gone through. Continue Lexapro 20 mg daily      Essential hypertension    Well controlled, no changes to meds. Encouraged heart healthy diet such as the DASH diet and exercise as tolerated.       Obstructive sleep apnea    She has had to stop      CSF leak from nose    From right nostril and she has had a surgery done by Dr Candiss Norse to repair at [facility]. She is referred to Dr Bradd Burner of [facility] to  have the packing removed at her request if she develops complications she will seek care sooner. Spent 30 minutes discussing history, care and plan.       Relevant Orders   Ambulatory referral to ENT      I have discontinued [facility]'s aspirin EC, diclofenac, and levofloxacin. I am also having her maintain her Hypromellose (ARTIFICIAL TEARS OP), acetaminophen, loratadine, fluticasone, glucose blood, anastrozole, Breo Ellipta, Ventolin HFA, carvedilol, diltiazem, escitalopram, esomeprazole, ezetimibe, furosemide, losartan, metFORMIN, montelukast, rosuvastatin, Vitamin D (Ergocalciferol), promethazine-dextromethorphan, amoxicillin, meclizine, and Docusate Calcium (STOOL SOFTENER PO).  No orders of the defined types were placed in this encounter.  I discussed the assessment and treatment plan with the patient. The patient was provided an opportunity to ask questions and all were answered. The patient agreed with the plan and demonstrated an understanding of the instructions.   The patient was advised to call back or seek an in-person evaluation if the symptoms worsen or if the condition fails to improve as anticipated.  I provided 30 minutes of non-face-to-face time during this encounter.  Penni Homans, MD

## 2020-01-18 DIAGNOSIS — E785 Hyperlipidemia, unspecified: Secondary | ICD-10-CM | POA: Diagnosis not present

## 2020-01-18 DIAGNOSIS — G4733 Obstructive sleep apnea (adult) (pediatric): Secondary | ICD-10-CM | POA: Diagnosis not present

## 2020-01-18 DIAGNOSIS — Z86011 Personal history of benign neoplasm of the brain: Secondary | ICD-10-CM | POA: Diagnosis not present

## 2020-01-18 DIAGNOSIS — E119 Type 2 diabetes mellitus without complications: Secondary | ICD-10-CM | POA: Diagnosis not present

## 2020-01-18 DIAGNOSIS — Z483 Aftercare following surgery for neoplasm: Secondary | ICD-10-CM | POA: Diagnosis not present

## 2020-01-18 DIAGNOSIS — I110 Hypertensive heart disease with heart failure: Secondary | ICD-10-CM | POA: Diagnosis not present

## 2020-01-18 DIAGNOSIS — J323 Chronic sphenoidal sinusitis: Secondary | ICD-10-CM | POA: Diagnosis not present

## 2020-01-18 DIAGNOSIS — J0130 Acute sphenoidal sinusitis, unspecified: Secondary | ICD-10-CM | POA: Diagnosis not present

## 2020-01-18 DIAGNOSIS — I509 Heart failure, unspecified: Secondary | ICD-10-CM | POA: Diagnosis not present

## 2020-01-18 DIAGNOSIS — Z7984 Long term (current) use of oral hypoglycemic drugs: Secondary | ICD-10-CM | POA: Diagnosis not present

## 2020-01-18 DIAGNOSIS — Z853 Personal history of malignant neoplasm of breast: Secondary | ICD-10-CM | POA: Diagnosis not present

## 2020-01-18 DIAGNOSIS — I272 Pulmonary hypertension, unspecified: Secondary | ICD-10-CM | POA: Diagnosis not present

## 2020-01-18 DIAGNOSIS — Z48811 Encounter for surgical aftercare following surgery on the nervous system: Secondary | ICD-10-CM | POA: Diagnosis not present

## 2020-01-18 DIAGNOSIS — K219 Gastro-esophageal reflux disease without esophagitis: Secondary | ICD-10-CM | POA: Diagnosis not present

## 2020-01-19 DIAGNOSIS — I509 Heart failure, unspecified: Secondary | ICD-10-CM | POA: Diagnosis not present

## 2020-01-19 DIAGNOSIS — J323 Chronic sphenoidal sinusitis: Secondary | ICD-10-CM | POA: Diagnosis not present

## 2020-01-19 DIAGNOSIS — I272 Pulmonary hypertension, unspecified: Secondary | ICD-10-CM | POA: Diagnosis not present

## 2020-01-19 DIAGNOSIS — Z483 Aftercare following surgery for neoplasm: Secondary | ICD-10-CM | POA: Diagnosis not present

## 2020-01-19 DIAGNOSIS — I110 Hypertensive heart disease with heart failure: Secondary | ICD-10-CM | POA: Diagnosis not present

## 2020-01-19 DIAGNOSIS — Z86011 Personal history of benign neoplasm of the brain: Secondary | ICD-10-CM | POA: Diagnosis not present

## 2020-01-19 DIAGNOSIS — Z48811 Encounter for surgical aftercare following surgery on the nervous system: Secondary | ICD-10-CM | POA: Diagnosis not present

## 2020-01-19 DIAGNOSIS — E119 Type 2 diabetes mellitus without complications: Secondary | ICD-10-CM | POA: Diagnosis not present

## 2020-01-19 DIAGNOSIS — E785 Hyperlipidemia, unspecified: Secondary | ICD-10-CM | POA: Diagnosis not present

## 2020-01-19 DIAGNOSIS — G4733 Obstructive sleep apnea (adult) (pediatric): Secondary | ICD-10-CM | POA: Diagnosis not present

## 2020-01-19 DIAGNOSIS — J0130 Acute sphenoidal sinusitis, unspecified: Secondary | ICD-10-CM | POA: Diagnosis not present

## 2020-01-19 DIAGNOSIS — K219 Gastro-esophageal reflux disease without esophagitis: Secondary | ICD-10-CM | POA: Diagnosis not present

## 2020-01-19 DIAGNOSIS — Z853 Personal history of malignant neoplasm of breast: Secondary | ICD-10-CM | POA: Diagnosis not present

## 2020-01-19 DIAGNOSIS — Z7984 Long term (current) use of oral hypoglycemic drugs: Secondary | ICD-10-CM | POA: Diagnosis not present

## 2020-01-20 ENCOUNTER — Telehealth: Payer: Self-pay | Admitting: Family Medicine

## 2020-01-20 DIAGNOSIS — J209 Acute bronchitis, unspecified: Secondary | ICD-10-CM

## 2020-01-20 DIAGNOSIS — E785 Hyperlipidemia, unspecified: Secondary | ICD-10-CM | POA: Diagnosis not present

## 2020-01-20 DIAGNOSIS — R682 Dry mouth, unspecified: Secondary | ICD-10-CM

## 2020-01-20 DIAGNOSIS — J440 Chronic obstructive pulmonary disease with acute lower respiratory infection: Secondary | ICD-10-CM

## 2020-01-20 DIAGNOSIS — E119 Type 2 diabetes mellitus without complications: Secondary | ICD-10-CM | POA: Diagnosis not present

## 2020-01-20 DIAGNOSIS — J0130 Acute sphenoidal sinusitis, unspecified: Secondary | ICD-10-CM | POA: Diagnosis not present

## 2020-01-20 DIAGNOSIS — Z7984 Long term (current) use of oral hypoglycemic drugs: Secondary | ICD-10-CM | POA: Diagnosis not present

## 2020-01-20 DIAGNOSIS — Z96651 Presence of right artificial knee joint: Secondary | ICD-10-CM | POA: Diagnosis not present

## 2020-01-20 DIAGNOSIS — I272 Pulmonary hypertension, unspecified: Secondary | ICD-10-CM | POA: Diagnosis not present

## 2020-01-20 DIAGNOSIS — G4733 Obstructive sleep apnea (adult) (pediatric): Secondary | ICD-10-CM | POA: Diagnosis not present

## 2020-01-20 DIAGNOSIS — I110 Hypertensive heart disease with heart failure: Secondary | ICD-10-CM | POA: Diagnosis not present

## 2020-01-20 DIAGNOSIS — Z48811 Encounter for surgical aftercare following surgery on the nervous system: Secondary | ICD-10-CM | POA: Diagnosis not present

## 2020-01-20 DIAGNOSIS — Z483 Aftercare following surgery for neoplasm: Secondary | ICD-10-CM | POA: Diagnosis not present

## 2020-01-20 DIAGNOSIS — J323 Chronic sphenoidal sinusitis: Secondary | ICD-10-CM | POA: Diagnosis not present

## 2020-01-20 DIAGNOSIS — Z86011 Personal history of benign neoplasm of the brain: Secondary | ICD-10-CM | POA: Diagnosis not present

## 2020-01-20 DIAGNOSIS — Z853 Personal history of malignant neoplasm of breast: Secondary | ICD-10-CM | POA: Diagnosis not present

## 2020-01-20 DIAGNOSIS — K219 Gastro-esophageal reflux disease without esophagitis: Secondary | ICD-10-CM | POA: Diagnosis not present

## 2020-01-20 DIAGNOSIS — I509 Heart failure, unspecified: Secondary | ICD-10-CM | POA: Diagnosis not present

## 2020-01-20 NOTE — Telephone Encounter (Signed)
New Message:   Delora Fuel is calling to see if a order can be placed to [place] for a water bottle to concentrate her oxygen machine. She states the pt is having a very dry mouth due to some packing she has in her nose at the moment. Please advise.

## 2020-01-21 NOTE — Telephone Encounter (Signed)
Left message that order has been sent and community message sent to [facility].

## 2020-01-22 DIAGNOSIS — E785 Hyperlipidemia, unspecified: Secondary | ICD-10-CM | POA: Diagnosis not present

## 2020-01-22 DIAGNOSIS — I110 Hypertensive heart disease with heart failure: Secondary | ICD-10-CM | POA: Diagnosis not present

## 2020-01-22 DIAGNOSIS — E119 Type 2 diabetes mellitus without complications: Secondary | ICD-10-CM | POA: Diagnosis not present

## 2020-01-22 DIAGNOSIS — Z483 Aftercare following surgery for neoplasm: Secondary | ICD-10-CM | POA: Diagnosis not present

## 2020-01-22 DIAGNOSIS — I272 Pulmonary hypertension, unspecified: Secondary | ICD-10-CM | POA: Diagnosis not present

## 2020-01-22 DIAGNOSIS — I509 Heart failure, unspecified: Secondary | ICD-10-CM | POA: Diagnosis not present

## 2020-01-22 DIAGNOSIS — Z48811 Encounter for surgical aftercare following surgery on the nervous system: Secondary | ICD-10-CM | POA: Diagnosis not present

## 2020-01-22 DIAGNOSIS — Z86011 Personal history of benign neoplasm of the brain: Secondary | ICD-10-CM | POA: Diagnosis not present

## 2020-01-22 DIAGNOSIS — J323 Chronic sphenoidal sinusitis: Secondary | ICD-10-CM | POA: Diagnosis not present

## 2020-01-22 DIAGNOSIS — K219 Gastro-esophageal reflux disease without esophagitis: Secondary | ICD-10-CM | POA: Diagnosis not present

## 2020-01-22 DIAGNOSIS — Z853 Personal history of malignant neoplasm of breast: Secondary | ICD-10-CM | POA: Diagnosis not present

## 2020-01-22 DIAGNOSIS — G4733 Obstructive sleep apnea (adult) (pediatric): Secondary | ICD-10-CM | POA: Diagnosis not present

## 2020-01-22 DIAGNOSIS — J0130 Acute sphenoidal sinusitis, unspecified: Secondary | ICD-10-CM | POA: Diagnosis not present

## 2020-01-22 DIAGNOSIS — Z7984 Long term (current) use of oral hypoglycemic drugs: Secondary | ICD-10-CM | POA: Diagnosis not present

## 2020-01-24 DIAGNOSIS — J323 Chronic sphenoidal sinusitis: Secondary | ICD-10-CM | POA: Diagnosis not present

## 2020-01-24 DIAGNOSIS — Z483 Aftercare following surgery for neoplasm: Secondary | ICD-10-CM | POA: Diagnosis not present

## 2020-01-24 DIAGNOSIS — I110 Hypertensive heart disease with heart failure: Secondary | ICD-10-CM | POA: Diagnosis not present

## 2020-01-24 DIAGNOSIS — K219 Gastro-esophageal reflux disease without esophagitis: Secondary | ICD-10-CM | POA: Diagnosis not present

## 2020-01-24 DIAGNOSIS — E119 Type 2 diabetes mellitus without complications: Secondary | ICD-10-CM | POA: Diagnosis not present

## 2020-01-24 DIAGNOSIS — Z7984 Long term (current) use of oral hypoglycemic drugs: Secondary | ICD-10-CM | POA: Diagnosis not present

## 2020-01-24 DIAGNOSIS — G4733 Obstructive sleep apnea (adult) (pediatric): Secondary | ICD-10-CM | POA: Diagnosis not present

## 2020-01-24 DIAGNOSIS — Z48811 Encounter for surgical aftercare following surgery on the nervous system: Secondary | ICD-10-CM | POA: Diagnosis not present

## 2020-01-24 DIAGNOSIS — Z853 Personal history of malignant neoplasm of breast: Secondary | ICD-10-CM | POA: Diagnosis not present

## 2020-01-24 DIAGNOSIS — E785 Hyperlipidemia, unspecified: Secondary | ICD-10-CM | POA: Diagnosis not present

## 2020-01-24 DIAGNOSIS — Z86011 Personal history of benign neoplasm of the brain: Secondary | ICD-10-CM | POA: Diagnosis not present

## 2020-01-24 DIAGNOSIS — I509 Heart failure, unspecified: Secondary | ICD-10-CM | POA: Diagnosis not present

## 2020-01-24 DIAGNOSIS — J0130 Acute sphenoidal sinusitis, unspecified: Secondary | ICD-10-CM | POA: Diagnosis not present

## 2020-01-24 DIAGNOSIS — I272 Pulmonary hypertension, unspecified: Secondary | ICD-10-CM | POA: Diagnosis not present

## 2020-01-25 DIAGNOSIS — Z7984 Long term (current) use of oral hypoglycemic drugs: Secondary | ICD-10-CM | POA: Diagnosis not present

## 2020-01-25 DIAGNOSIS — E119 Type 2 diabetes mellitus without complications: Secondary | ICD-10-CM | POA: Diagnosis not present

## 2020-01-25 DIAGNOSIS — Z48811 Encounter for surgical aftercare following surgery on the nervous system: Secondary | ICD-10-CM | POA: Diagnosis not present

## 2020-01-25 DIAGNOSIS — J323 Chronic sphenoidal sinusitis: Secondary | ICD-10-CM | POA: Diagnosis not present

## 2020-01-25 DIAGNOSIS — Z483 Aftercare following surgery for neoplasm: Secondary | ICD-10-CM | POA: Diagnosis not present

## 2020-01-25 DIAGNOSIS — G4733 Obstructive sleep apnea (adult) (pediatric): Secondary | ICD-10-CM | POA: Diagnosis not present

## 2020-01-25 DIAGNOSIS — K219 Gastro-esophageal reflux disease without esophagitis: Secondary | ICD-10-CM | POA: Diagnosis not present

## 2020-01-25 DIAGNOSIS — I272 Pulmonary hypertension, unspecified: Secondary | ICD-10-CM | POA: Diagnosis not present

## 2020-01-25 DIAGNOSIS — Z853 Personal history of malignant neoplasm of breast: Secondary | ICD-10-CM | POA: Diagnosis not present

## 2020-01-25 DIAGNOSIS — E785 Hyperlipidemia, unspecified: Secondary | ICD-10-CM | POA: Diagnosis not present

## 2020-01-25 DIAGNOSIS — Z86011 Personal history of benign neoplasm of the brain: Secondary | ICD-10-CM | POA: Diagnosis not present

## 2020-01-25 DIAGNOSIS — I509 Heart failure, unspecified: Secondary | ICD-10-CM | POA: Diagnosis not present

## 2020-01-25 DIAGNOSIS — I110 Hypertensive heart disease with heart failure: Secondary | ICD-10-CM | POA: Diagnosis not present

## 2020-01-25 DIAGNOSIS — J0130 Acute sphenoidal sinusitis, unspecified: Secondary | ICD-10-CM | POA: Diagnosis not present

## 2020-01-28 DIAGNOSIS — Z48811 Encounter for surgical aftercare following surgery on the nervous system: Secondary | ICD-10-CM | POA: Diagnosis not present

## 2020-01-28 DIAGNOSIS — K219 Gastro-esophageal reflux disease without esophagitis: Secondary | ICD-10-CM | POA: Diagnosis not present

## 2020-01-28 DIAGNOSIS — I509 Heart failure, unspecified: Secondary | ICD-10-CM | POA: Diagnosis not present

## 2020-01-28 DIAGNOSIS — Z483 Aftercare following surgery for neoplasm: Secondary | ICD-10-CM | POA: Diagnosis not present

## 2020-01-28 DIAGNOSIS — J323 Chronic sphenoidal sinusitis: Secondary | ICD-10-CM | POA: Diagnosis not present

## 2020-01-28 DIAGNOSIS — G4733 Obstructive sleep apnea (adult) (pediatric): Secondary | ICD-10-CM | POA: Diagnosis not present

## 2020-01-28 DIAGNOSIS — E119 Type 2 diabetes mellitus without complications: Secondary | ICD-10-CM | POA: Diagnosis not present

## 2020-01-28 DIAGNOSIS — J0130 Acute sphenoidal sinusitis, unspecified: Secondary | ICD-10-CM | POA: Diagnosis not present

## 2020-01-28 DIAGNOSIS — E785 Hyperlipidemia, unspecified: Secondary | ICD-10-CM | POA: Diagnosis not present

## 2020-01-28 DIAGNOSIS — Z853 Personal history of malignant neoplasm of breast: Secondary | ICD-10-CM | POA: Diagnosis not present

## 2020-01-28 DIAGNOSIS — Z7984 Long term (current) use of oral hypoglycemic drugs: Secondary | ICD-10-CM | POA: Diagnosis not present

## 2020-01-28 DIAGNOSIS — I110 Hypertensive heart disease with heart failure: Secondary | ICD-10-CM | POA: Diagnosis not present

## 2020-01-28 DIAGNOSIS — Z86011 Personal history of benign neoplasm of the brain: Secondary | ICD-10-CM | POA: Diagnosis not present

## 2020-01-28 DIAGNOSIS — I272 Pulmonary hypertension, unspecified: Secondary | ICD-10-CM | POA: Diagnosis not present

## 2020-02-01 ENCOUNTER — Encounter: Payer: Self-pay | Admitting: Family Medicine

## 2020-02-01 DIAGNOSIS — J0130 Acute sphenoidal sinusitis, unspecified: Secondary | ICD-10-CM | POA: Diagnosis not present

## 2020-02-01 DIAGNOSIS — Z48811 Encounter for surgical aftercare following surgery on the nervous system: Secondary | ICD-10-CM | POA: Diagnosis not present

## 2020-02-01 DIAGNOSIS — Z86011 Personal history of benign neoplasm of the brain: Secondary | ICD-10-CM | POA: Diagnosis not present

## 2020-02-01 DIAGNOSIS — E785 Hyperlipidemia, unspecified: Secondary | ICD-10-CM | POA: Diagnosis not present

## 2020-02-01 DIAGNOSIS — G4733 Obstructive sleep apnea (adult) (pediatric): Secondary | ICD-10-CM | POA: Diagnosis not present

## 2020-02-01 DIAGNOSIS — Z7984 Long term (current) use of oral hypoglycemic drugs: Secondary | ICD-10-CM | POA: Diagnosis not present

## 2020-02-01 DIAGNOSIS — Z853 Personal history of malignant neoplasm of breast: Secondary | ICD-10-CM | POA: Diagnosis not present

## 2020-02-01 DIAGNOSIS — J323 Chronic sphenoidal sinusitis: Secondary | ICD-10-CM | POA: Diagnosis not present

## 2020-02-01 DIAGNOSIS — I272 Pulmonary hypertension, unspecified: Secondary | ICD-10-CM | POA: Diagnosis not present

## 2020-02-01 DIAGNOSIS — K219 Gastro-esophageal reflux disease without esophagitis: Secondary | ICD-10-CM | POA: Diagnosis not present

## 2020-02-01 DIAGNOSIS — I509 Heart failure, unspecified: Secondary | ICD-10-CM | POA: Diagnosis not present

## 2020-02-01 DIAGNOSIS — I110 Hypertensive heart disease with heart failure: Secondary | ICD-10-CM | POA: Diagnosis not present

## 2020-02-01 DIAGNOSIS — Z483 Aftercare following surgery for neoplasm: Secondary | ICD-10-CM | POA: Diagnosis not present

## 2020-02-01 DIAGNOSIS — E119 Type 2 diabetes mellitus without complications: Secondary | ICD-10-CM | POA: Diagnosis not present

## 2020-02-02 ENCOUNTER — Other Ambulatory Visit: Payer: Self-pay | Admitting: Family Medicine

## 2020-02-02 MED ORDER — TRAMADOL HCL 50 MG PO TABS: 50.0000 mg | ORAL_TABLET | Freq: Three times a day (TID) | ORAL | 0 refills | Status: AC | PRN

## 2020-02-04 DIAGNOSIS — I5032 Chronic diastolic (congestive) heart failure: Secondary | ICD-10-CM | POA: Diagnosis not present

## 2020-02-04 DIAGNOSIS — G4733 Obstructive sleep apnea (adult) (pediatric): Secondary | ICD-10-CM | POA: Diagnosis not present

## 2020-02-07 DIAGNOSIS — G4733 Obstructive sleep apnea (adult) (pediatric): Secondary | ICD-10-CM | POA: Diagnosis not present

## 2020-02-07 DIAGNOSIS — K219 Gastro-esophageal reflux disease without esophagitis: Secondary | ICD-10-CM | POA: Diagnosis not present

## 2020-02-07 DIAGNOSIS — I509 Heart failure, unspecified: Secondary | ICD-10-CM | POA: Diagnosis not present

## 2020-02-07 DIAGNOSIS — Z86011 Personal history of benign neoplasm of the brain: Secondary | ICD-10-CM | POA: Diagnosis not present

## 2020-02-07 DIAGNOSIS — J0130 Acute sphenoidal sinusitis, unspecified: Secondary | ICD-10-CM | POA: Diagnosis not present

## 2020-02-07 DIAGNOSIS — E119 Type 2 diabetes mellitus without complications: Secondary | ICD-10-CM | POA: Diagnosis not present

## 2020-02-07 DIAGNOSIS — Z483 Aftercare following surgery for neoplasm: Secondary | ICD-10-CM | POA: Diagnosis not present

## 2020-02-07 DIAGNOSIS — J323 Chronic sphenoidal sinusitis: Secondary | ICD-10-CM | POA: Diagnosis not present

## 2020-02-07 DIAGNOSIS — E785 Hyperlipidemia, unspecified: Secondary | ICD-10-CM | POA: Diagnosis not present

## 2020-02-07 DIAGNOSIS — I110 Hypertensive heart disease with heart failure: Secondary | ICD-10-CM | POA: Diagnosis not present

## 2020-02-07 DIAGNOSIS — Z48811 Encounter for surgical aftercare following surgery on the nervous system: Secondary | ICD-10-CM | POA: Diagnosis not present

## 2020-02-07 DIAGNOSIS — Z7984 Long term (current) use of oral hypoglycemic drugs: Secondary | ICD-10-CM | POA: Diagnosis not present

## 2020-02-07 DIAGNOSIS — Z853 Personal history of malignant neoplasm of breast: Secondary | ICD-10-CM | POA: Diagnosis not present

## 2020-02-07 DIAGNOSIS — I272 Pulmonary hypertension, unspecified: Secondary | ICD-10-CM | POA: Diagnosis not present

## 2020-02-08 DIAGNOSIS — E119 Type 2 diabetes mellitus without complications: Secondary | ICD-10-CM | POA: Diagnosis not present

## 2020-02-08 DIAGNOSIS — Z48811 Encounter for surgical aftercare following surgery on the nervous system: Secondary | ICD-10-CM | POA: Diagnosis not present

## 2020-02-08 DIAGNOSIS — Z86011 Personal history of benign neoplasm of the brain: Secondary | ICD-10-CM | POA: Diagnosis not present

## 2020-02-08 DIAGNOSIS — G4733 Obstructive sleep apnea (adult) (pediatric): Secondary | ICD-10-CM | POA: Diagnosis not present

## 2020-02-08 DIAGNOSIS — I509 Heart failure, unspecified: Secondary | ICD-10-CM | POA: Diagnosis not present

## 2020-02-08 DIAGNOSIS — Z7984 Long term (current) use of oral hypoglycemic drugs: Secondary | ICD-10-CM | POA: Diagnosis not present

## 2020-02-08 DIAGNOSIS — J0130 Acute sphenoidal sinusitis, unspecified: Secondary | ICD-10-CM | POA: Diagnosis not present

## 2020-02-08 DIAGNOSIS — E785 Hyperlipidemia, unspecified: Secondary | ICD-10-CM | POA: Diagnosis not present

## 2020-02-08 DIAGNOSIS — Z853 Personal history of malignant neoplasm of breast: Secondary | ICD-10-CM | POA: Diagnosis not present

## 2020-02-08 DIAGNOSIS — K219 Gastro-esophageal reflux disease without esophagitis: Secondary | ICD-10-CM | POA: Diagnosis not present

## 2020-02-08 DIAGNOSIS — I272 Pulmonary hypertension, unspecified: Secondary | ICD-10-CM | POA: Diagnosis not present

## 2020-02-08 DIAGNOSIS — I110 Hypertensive heart disease with heart failure: Secondary | ICD-10-CM | POA: Diagnosis not present

## 2020-02-08 DIAGNOSIS — Z483 Aftercare following surgery for neoplasm: Secondary | ICD-10-CM | POA: Diagnosis not present

## 2020-02-08 DIAGNOSIS — J323 Chronic sphenoidal sinusitis: Secondary | ICD-10-CM | POA: Diagnosis not present

## 2020-02-11 DIAGNOSIS — Z86011 Personal history of benign neoplasm of the brain: Secondary | ICD-10-CM | POA: Diagnosis not present

## 2020-02-11 DIAGNOSIS — K219 Gastro-esophageal reflux disease without esophagitis: Secondary | ICD-10-CM | POA: Diagnosis not present

## 2020-02-11 DIAGNOSIS — I509 Heart failure, unspecified: Secondary | ICD-10-CM | POA: Diagnosis not present

## 2020-02-11 DIAGNOSIS — I110 Hypertensive heart disease with heart failure: Secondary | ICD-10-CM | POA: Diagnosis not present

## 2020-02-11 DIAGNOSIS — Z853 Personal history of malignant neoplasm of breast: Secondary | ICD-10-CM | POA: Diagnosis not present

## 2020-02-11 DIAGNOSIS — G4733 Obstructive sleep apnea (adult) (pediatric): Secondary | ICD-10-CM | POA: Diagnosis not present

## 2020-02-11 DIAGNOSIS — J0130 Acute sphenoidal sinusitis, unspecified: Secondary | ICD-10-CM | POA: Diagnosis not present

## 2020-02-11 DIAGNOSIS — Z483 Aftercare following surgery for neoplasm: Secondary | ICD-10-CM | POA: Diagnosis not present

## 2020-02-11 DIAGNOSIS — E119 Type 2 diabetes mellitus without complications: Secondary | ICD-10-CM | POA: Diagnosis not present

## 2020-02-11 DIAGNOSIS — E785 Hyperlipidemia, unspecified: Secondary | ICD-10-CM | POA: Diagnosis not present

## 2020-02-11 DIAGNOSIS — Z7984 Long term (current) use of oral hypoglycemic drugs: Secondary | ICD-10-CM | POA: Diagnosis not present

## 2020-02-11 DIAGNOSIS — J323 Chronic sphenoidal sinusitis: Secondary | ICD-10-CM | POA: Diagnosis not present

## 2020-02-11 DIAGNOSIS — Z48811 Encounter for surgical aftercare following surgery on the nervous system: Secondary | ICD-10-CM | POA: Diagnosis not present

## 2020-02-11 DIAGNOSIS — I272 Pulmonary hypertension, unspecified: Secondary | ICD-10-CM | POA: Diagnosis not present

## 2020-02-15 DIAGNOSIS — J310 Chronic rhinitis: Secondary | ICD-10-CM | POA: Diagnosis not present

## 2020-02-20 DIAGNOSIS — G4733 Obstructive sleep apnea (adult) (pediatric): Secondary | ICD-10-CM | POA: Diagnosis not present

## 2020-02-20 DIAGNOSIS — Z96651 Presence of right artificial knee joint: Secondary | ICD-10-CM | POA: Diagnosis not present

## 2020-02-23 DIAGNOSIS — K219 Gastro-esophageal reflux disease without esophagitis: Secondary | ICD-10-CM | POA: Diagnosis not present

## 2020-02-23 DIAGNOSIS — Z48811 Encounter for surgical aftercare following surgery on the nervous system: Secondary | ICD-10-CM | POA: Diagnosis not present

## 2020-02-23 DIAGNOSIS — E119 Type 2 diabetes mellitus without complications: Secondary | ICD-10-CM | POA: Diagnosis not present

## 2020-02-23 DIAGNOSIS — Z7984 Long term (current) use of oral hypoglycemic drugs: Secondary | ICD-10-CM | POA: Diagnosis not present

## 2020-02-23 DIAGNOSIS — Z86011 Personal history of benign neoplasm of the brain: Secondary | ICD-10-CM | POA: Diagnosis not present

## 2020-02-23 DIAGNOSIS — G4733 Obstructive sleep apnea (adult) (pediatric): Secondary | ICD-10-CM | POA: Diagnosis not present

## 2020-02-23 DIAGNOSIS — J323 Chronic sphenoidal sinusitis: Secondary | ICD-10-CM | POA: Diagnosis not present

## 2020-02-23 DIAGNOSIS — J0130 Acute sphenoidal sinusitis, unspecified: Secondary | ICD-10-CM | POA: Diagnosis not present

## 2020-02-23 DIAGNOSIS — I272 Pulmonary hypertension, unspecified: Secondary | ICD-10-CM | POA: Diagnosis not present

## 2020-02-23 DIAGNOSIS — E785 Hyperlipidemia, unspecified: Secondary | ICD-10-CM | POA: Diagnosis not present

## 2020-02-23 DIAGNOSIS — Z483 Aftercare following surgery for neoplasm: Secondary | ICD-10-CM | POA: Diagnosis not present

## 2020-02-23 DIAGNOSIS — I509 Heart failure, unspecified: Secondary | ICD-10-CM | POA: Diagnosis not present

## 2020-02-23 DIAGNOSIS — Z853 Personal history of malignant neoplasm of breast: Secondary | ICD-10-CM | POA: Diagnosis not present

## 2020-02-23 DIAGNOSIS — I110 Hypertensive heart disease with heart failure: Secondary | ICD-10-CM | POA: Diagnosis not present

## 2020-02-29 DIAGNOSIS — J0130 Acute sphenoidal sinusitis, unspecified: Secondary | ICD-10-CM | POA: Diagnosis not present

## 2020-02-29 DIAGNOSIS — Z483 Aftercare following surgery for neoplasm: Secondary | ICD-10-CM | POA: Diagnosis not present

## 2020-02-29 DIAGNOSIS — J323 Chronic sphenoidal sinusitis: Secondary | ICD-10-CM | POA: Diagnosis not present

## 2020-02-29 DIAGNOSIS — I272 Pulmonary hypertension, unspecified: Secondary | ICD-10-CM | POA: Diagnosis not present

## 2020-02-29 DIAGNOSIS — E119 Type 2 diabetes mellitus without complications: Secondary | ICD-10-CM | POA: Diagnosis not present

## 2020-02-29 DIAGNOSIS — Z7984 Long term (current) use of oral hypoglycemic drugs: Secondary | ICD-10-CM | POA: Diagnosis not present

## 2020-02-29 DIAGNOSIS — I110 Hypertensive heart disease with heart failure: Secondary | ICD-10-CM | POA: Diagnosis not present

## 2020-02-29 DIAGNOSIS — E785 Hyperlipidemia, unspecified: Secondary | ICD-10-CM | POA: Diagnosis not present

## 2020-02-29 DIAGNOSIS — Z86011 Personal history of benign neoplasm of the brain: Secondary | ICD-10-CM | POA: Diagnosis not present

## 2020-02-29 DIAGNOSIS — G4733 Obstructive sleep apnea (adult) (pediatric): Secondary | ICD-10-CM | POA: Diagnosis not present

## 2020-02-29 DIAGNOSIS — Z853 Personal history of malignant neoplasm of breast: Secondary | ICD-10-CM | POA: Diagnosis not present

## 2020-02-29 DIAGNOSIS — I509 Heart failure, unspecified: Secondary | ICD-10-CM | POA: Diagnosis not present

## 2020-02-29 DIAGNOSIS — K219 Gastro-esophageal reflux disease without esophagitis: Secondary | ICD-10-CM | POA: Diagnosis not present

## 2020-02-29 DIAGNOSIS — Z48811 Encounter for surgical aftercare following surgery on the nervous system: Secondary | ICD-10-CM | POA: Diagnosis not present

## 2020-03-06 DIAGNOSIS — I5032 Chronic diastolic (congestive) heart failure: Secondary | ICD-10-CM | POA: Diagnosis not present

## 2020-03-06 DIAGNOSIS — G4733 Obstructive sleep apnea (adult) (pediatric): Secondary | ICD-10-CM | POA: Diagnosis not present

## 2020-03-17 ENCOUNTER — Telehealth: Payer: Self-pay | Admitting: Internal Medicine

## 2020-03-17 DIAGNOSIS — G4734 Idiopathic sleep related nonobstructive alveolar hypoventilation: Secondary | ICD-10-CM

## 2020-03-17 NOTE — Telephone Encounter (Signed)
Called and spoke with patient, she states after she had surgery for a CFS leak in [facility] and could not wear her CPAP post op and oxygen was ordered.  She is able to wear to her CPAP now and is wearing it and would like to have her oxygen picked up. Dr. Annamaria Boots, Please advise if ok for DME to pick up her oxygen.  Thank you.

## 2020-03-18 NOTE — Telephone Encounter (Signed)
Ok to dc O2 

## 2020-03-22 DIAGNOSIS — Z96651 Presence of right artificial knee joint: Secondary | ICD-10-CM | POA: Diagnosis not present

## 2020-03-22 DIAGNOSIS — G4733 Obstructive sleep apnea (adult) (pediatric): Secondary | ICD-10-CM | POA: Diagnosis not present

## 2020-03-22 NOTE — Telephone Encounter (Signed)
Called and spoke with pt letting her know that CY said it was okay to d/c o2 and she verbalized understanding. Order has been placed.  Nothing further needed.

## 2020-04-03 ENCOUNTER — Other Ambulatory Visit: Payer: Self-pay | Admitting: Family Medicine

## 2020-04-03 DIAGNOSIS — Z1231 Encounter for screening mammogram for malignant neoplasm of breast: Secondary | ICD-10-CM

## 2020-04-04 ENCOUNTER — Other Ambulatory Visit: Payer: Self-pay

## 2020-04-04 NOTE — Patient Outreach (Signed)
[facility]) Care Management  04/04/2020  [name] 16-Feb-1951 [id number]   Telephone call to patient for disease management follow up.   No answer.  HIPAA compliant voice message left.    Plan: If no return call, RN CM will attempt patient again in the month of January.  Jone Baseman, RN, MSN [place] Management Care Management Coordinator Direct Line [phone] Cell [phone] Toll Free: [phone]  Fax: [phone]

## 2020-04-11 NOTE — Progress Notes (Signed)
Patient Care Team: [name], MD as PCP - General (Family Medicine) Sueanne Margarita, MD as PCP - Cardiology (Cardiology) Fanny Skates, MD as Consulting Physician (General Surgery) Nicholas Lose, MD as Consulting Physician (Hematology and Oncology) Gery Pray, MD as Consulting Physician (Radiation Oncology) Delice Bison, Charlestine Massed, NP as Nurse Practitioner (Hematology and Oncology) Jon Billings, RN as [place] Management  DIAGNOSIS:    ICD-10-CM   1. Malignant neoplasm of upper-outer quadrant of left breast in female, estrogen receptor positive ([place])  C50.412    Z17.0     SUMMARY OF ONCOLOGIC HISTORY: Oncology History  Malignant neoplasm of upper-outer quadrant of left female breast ([place])  05/13/2016 Initial Diagnosis   Left breast biopsy 3:00: IDC, grade 3, ER 30%, PR 0%, Ki-67 40%, HER-2 positive ratio 2.71, screening detected left breast mass 9 x 7 x 6 mm, axilla negative, T1 BN 0 stage IA clinical stage   06/21/2016 Surgery   Left lumpectomy: IDC grade 3, 1.1 cm, 1/5 LN Positive with ECE, Margins Neg, LVI Present; Er 30%, PR 0%, Ki 67 40%, Her 2 Positive Ratio 2.71; T1CN1 (Stage 2B)   07/25/2016 - 01/09/2017 Chemotherapy   Taxol weekly 12; Herceptin and Perjeta every 3 weeks     10/31/2016 - 11/28/2016 Radiation Therapy   Adjuvant radiation therapy   12/2016 -  Anti-estrogen oral therapy   Anastrozole daily     CHIEF COMPLIANT: Follow-up of left breast cancer on anastrozole therapy  INTERVAL HISTORY: [name] is a 69 y.o. with above-mentioned history of left breast cancer treated with lumpectomy, adjuvant radiation, and who is currently on anastrozole. Mammogram on 05/17/19 showed no evidence of malignancy bilaterally. She presents to the clinic today for follow-up.   She informed me that she went to [place] and got severe pounding headache and was admitted to [facility] where she was diagnosed with a CSF leak.  It  has improved and she is able to come home.  She has mild intermittent discomfort in the breast but otherwise doing okay.  She is tolerating anastrozole fairly well.  ALLERGIES:  is allergic to no known allergies.  MEDICATIONS:  Current Outpatient Medications  Medication Sig Dispense Refill  . acetaminophen (TYLENOL) 500 MG tablet Take 1,000 mg by mouth 2 (two) times daily as needed for moderate pain or headache.    . anastrozole (ARIMIDEX) 1 MG tablet Take 1 tablet (1 mg total) by mouth daily. 90 tablet 3  . BREO ELLIPTA 100-25 MCG/INH AEPB INHALE 1 PUFF INTO LUNGS ONCE A DAY 60 each 3  . carvedilol (COREG) 6.25 MG tablet Take 1 tablet (6.25 mg total) by mouth 2 (two) times daily. 180 tablet 1  . diltiazem (CARDIZEM CD) 120 MG 24 hr capsule Take 1 capsule (120 mg total) by mouth daily. 90 capsule 3  . Docusate Calcium (STOOL SOFTENER PO) Take by mouth daily.    Marland Kitchen escitalopram (LEXAPRO) 20 MG tablet Take 20 mg po in am and 10 mg po in pm 135 tablet 1  . esomeprazole (NEXIUM) 40 MG capsule Take 1 capsule (40 mg total) by mouth daily. 90 capsule 3  . ezetimibe (ZETIA) 10 MG tablet Take 1 tablet (10 mg total) by mouth daily. 90 tablet 3  . fluticasone (FLONASE) 50 MCG/ACT nasal spray Place 2 sprays into both nostrils daily. 16 g 6  . furosemide (LASIX) 20 MG tablet Take 1 tablet (20 mg total) by mouth daily. 90 tablet 3  . glucose  blood (ACCU-CHEK AVIVA) test strip Twice daily (please use brand that patient insurance covers) 100 each 5  . Hypromellose (ARTIFICIAL TEARS OP) Place 1 drop into both eyes daily as needed (dry eyes).     Marland Kitchen loratadine (CLARITIN) 10 MG tablet Take 1 tablet (10 mg total) by mouth daily. 30 tablet 11  . losartan (COZAAR) 100 MG tablet Take 1 tablet (100 mg total) by mouth daily. 90 tablet 3  . meclizine (ANTIVERT) 25 MG tablet Take 25 mg by mouth every 6 (six) hours as needed.    . metFORMIN (GLUCOPHAGE) 500 MG tablet Take 1 tablet (500 mg total) by mouth 2 (two) times  daily with a meal. 180 tablet 1  . montelukast (SINGULAIR) 10 MG tablet Take 1 tablet (10 mg total) by mouth at bedtime. 90 tablet 3  . rosuvastatin (CRESTOR) 40 MG tablet Take 1 tablet (40 mg total) by mouth daily. 90 tablet 3  . VENTOLIN HFA 108 (90 Base) MCG/ACT inhaler INHALE 2 PUFFS INTO THE LUNGS EVERY 6 HOURS AS NEEDED FOR WHEEZING INTO RECTUM SHORTNESS OF BREATH 18 g 12  . Vitamin D, Ergocalciferol, (DRISDOL) 1.25 MG (50000 UNIT) CAPS capsule Take 1 capsule (50,000 Units total) by mouth every 7 (seven) days. 4 capsule 4   No current facility-administered medications for this visit.    PHYSICAL EXAMINATION: ECOG PERFORMANCE STATUS: 1 - Symptomatic but completely ambulatory  Vitals:   04/12/20 1020  BP: (!) 146/85  Pulse: 93  Resp: 17  Temp: 97.6 F (36.4 C)  SpO2: 96%   Filed Weights   04/12/20 1020  Weight: (!) 312 lb 12.8 oz (141.9 kg)    BREAST: No palpable masses or nodules in either right or left breasts. No palpable axillary supraclavicular or infraclavicular adenopathy no breast tenderness or nipple discharge. (exam performed in the presence of a chaperone)  LABORATORY DATA:  I have reviewed the data as listed CMP Latest Ref Rng & Units 09/06/2019 07/14/2019 07/02/2018  Glucose 70 - 99 mg/dL 123(H) 134(H) 118(H)  BUN 6 - 23 mg/dL _0 Creatinine 0.40 - 1.20 mg/dL 0.88 0.79 0.80  Sodium 135 - 145 mEq/L 140 141 142  Potassium 3.5 - 5.1 mEq/L 4.6 4.4 5.0  Chloride 96 - 112 mEq/L 102 102 100  CO2 19 - 32 mEq/L _1 Calcium 8.4 - 10.5 mg/dL 10.0 10.0 10.3  Total Protein 6.0 - 8.3 g/dL 7.2 7.2 7.1  Total Bilirubin 0.2 - 1.2 mg/dL 0.6 0.4 0.3  Alkaline Phos 39 - 117 U/L 103 120(H) 136(H)  AST 0 - 37 U/L _2 ALT 0 - 35 U/L _3 Lab Results  Component Value Date   WBC 8.2 09/06/2019   HGB 13.4 09/06/2019   HCT 41.3 09/06/2019   MCV 84.0 09/06/2019   PLT 267.0 09/06/2019   NEUTROABS 6.9 03/26/2018    ASSESSMENT & PLAN:  Malignant  neoplasm of upper-outer quadrant of left female breast ([place]) 06/21/16: Left lumpectomy: IDC grade 3, 1.1 cm, 1/5 LN Positive with ECE, Margins Neg, LVI Present; Er 30%, PR 0%, Ki 67 40%, Her 2 Positive Ratio 2.71; T1CN1 (Stage 2B)  Patient has multiple comorbidities including history of LVH with congestive heart failure:She has an echocardiogram coming up next month  Treatment Plan: 1. Adjuvant Taxol with Herceptin And Perjeta (Last echocardiogram showed an ejection fraction of 55-60%-- repeat on 4/3/2018EF 60-65%) completed 01/09/2017 2. Followed by adjuvant radiation 3. Followed by adjuvant  antiestrogen therapy with anastrozole 1 mg daily 5 years (bone density 04/29/2016 T score -1.2) ----------------------------------------------------------------------------------------------------------------------------------------------------- Current Treatment:Anastrozole 1 mg daily started 04/29/2016 Anastrozole toxicities: Denies any hot flashes or myalgias  Surveillance: Mammogram 05/17/2019: Benign Breast exam: 04/12/2020: Benign  Severe shortness of breath to minimal exertion Recent hospitalization for CSF leak at [facility] June 2021  Return to clinic in 1 year for follow-up    No orders of the defined types were placed in this encounter.  The patient has a good understanding of the overall plan. she agrees with it. she will call with any problems that may develop before the next visit here.  Total time spent: 20 mins including face to face time and time spent for planning, charting and coordination of care  Nicholas Lose, MD 04/12/2020  I, Cloyde Reams Dorshimer, am acting as scribe for Dr. Nicholas Lose.  I have reviewed the above documentation for accuracy and completeness, and I agree with the above.

## 2020-04-12 ENCOUNTER — Other Ambulatory Visit: Payer: Self-pay

## 2020-04-12 ENCOUNTER — Inpatient Hospital Stay: Payer: Medicare Other | Attending: Hematology and Oncology | Admitting: Hematology and Oncology

## 2020-04-12 DIAGNOSIS — I509 Heart failure, unspecified: Secondary | ICD-10-CM | POA: Insufficient documentation

## 2020-04-12 DIAGNOSIS — Z79899 Other long term (current) drug therapy: Secondary | ICD-10-CM | POA: Diagnosis not present

## 2020-04-12 DIAGNOSIS — J310 Chronic rhinitis: Secondary | ICD-10-CM | POA: Diagnosis not present

## 2020-04-12 DIAGNOSIS — Z7984 Long term (current) use of oral hypoglycemic drugs: Secondary | ICD-10-CM | POA: Diagnosis not present

## 2020-04-12 DIAGNOSIS — C50412 Malignant neoplasm of upper-outer quadrant of left female breast: Secondary | ICD-10-CM | POA: Diagnosis not present

## 2020-04-12 DIAGNOSIS — Z923 Personal history of irradiation: Secondary | ICD-10-CM | POA: Insufficient documentation

## 2020-04-12 DIAGNOSIS — Z9221 Personal history of antineoplastic chemotherapy: Secondary | ICD-10-CM | POA: Insufficient documentation

## 2020-04-12 DIAGNOSIS — Z170 Estrogen receptor positive status [ER+]: Secondary | ICD-10-CM | POA: Diagnosis not present

## 2020-04-12 MED ORDER — ANASTROZOLE 1 MG PO TABS: 1.0000 mg | ORAL_TABLET | Freq: Every day | ORAL | 3 refills | Status: DC

## 2020-04-12 NOTE — Assessment & Plan Note (Signed)
06/21/16: Left lumpectomy: IDC grade 3, 1.1 cm, 1/5 LN Positive with ECE, Margins Neg, LVI Present; Er 30%, PR 0%, Ki 67 40%, Her 2 Positive Ratio 2.71; T1CN1 (Stage 2B)  Patient has multiple comorbidities including history of LVH with congestive heart failure:She has an echocardiogram coming up next month  Treatment Plan: 1. Adjuvant Taxol with Herceptin And Perjeta (Last echocardiogram showed an ejection fraction of 55-60%-- repeat on 4/3/2018EF 60-65%) completed 01/09/2017 2. Followed by adjuvant radiation 3. Followed by adjuvant antiestrogen therapy with anastrozole 1 mg daily 5 years (bone density 04/29/2016 T score -1.2) ----------------------------------------------------------------------------------------------------------------------------------------------------- Current Treatment:Anastrozole 1 mg daily started 04/29/2016 Anastrozole toxicities: Denies any hot flashes or myalgias  Surveillance: Mammogram 05/17/2019: Benign Breast exam: 04/12/2020: Benign  Severe shortness of breath to minimal exertion Recent hospitalization for CSF leak at [facility] June 2021  Return to clinic in 1 year for follow-up

## 2020-04-13 ENCOUNTER — Telehealth: Payer: Self-pay | Admitting: Hematology and Oncology

## 2020-04-13 NOTE — Telephone Encounter (Signed)
Scheduled per 10/20 los. Called and spoke with pt, confirmed added appts

## 2020-04-21 DIAGNOSIS — Z96651 Presence of right artificial knee joint: Secondary | ICD-10-CM | POA: Diagnosis not present

## 2020-04-21 DIAGNOSIS — G4733 Obstructive sleep apnea (adult) (pediatric): Secondary | ICD-10-CM | POA: Diagnosis not present

## 2020-05-10 ENCOUNTER — Other Ambulatory Visit: Payer: Self-pay | Admitting: Family Medicine

## 2020-05-15 ENCOUNTER — Other Ambulatory Visit: Payer: Self-pay

## 2020-05-15 ENCOUNTER — Ambulatory Visit (INDEPENDENT_AMBULATORY_CARE_PROVIDER_SITE_OTHER): Payer: Medicare Other | Admitting: Family

## 2020-05-15 VITALS — BP 181/97 | HR 88 | Temp 96.6°F | Resp 18 | Ht 66.0 in | Wt 315.0 lb

## 2020-05-15 DIAGNOSIS — I10 Essential (primary) hypertension: Secondary | ICD-10-CM | POA: Diagnosis not present

## 2020-05-15 DIAGNOSIS — N3000 Acute cystitis without hematuria: Secondary | ICD-10-CM | POA: Diagnosis not present

## 2020-05-15 DIAGNOSIS — R350 Frequency of micturition: Secondary | ICD-10-CM

## 2020-05-15 DIAGNOSIS — J320 Chronic maxillary sinusitis: Secondary | ICD-10-CM

## 2020-05-15 LAB — POC URINALSYSI DIPSTICK (AUTOMATED)
Bilirubin, UA: NEGATIVE
Glucose, UA: NEGATIVE
Ketones, UA: NEGATIVE
Nitrite, UA: NEGATIVE
Protein, UA: POSITIVE — AB
Spec Grav, UA: 1.025 (ref 1.010–1.025)
Urobilinogen, UA: 0.2 E.U./dL
pH, UA: 6 (ref 5.0–8.0)

## 2020-05-15 MED ORDER — CEFUROXIME AXETIL 500 MG PO TABS: 500.0000 mg | ORAL_TABLET | Freq: Two times a day (BID) | ORAL | 0 refills | Status: DC

## 2020-05-15 MED ORDER — CARVEDILOL 12.5 MG PO TABS: 12.5000 mg | ORAL_TABLET | Freq: Two times a day (BID) | ORAL | 2 refills | Status: DC

## 2020-05-15 NOTE — Progress Notes (Signed)
Subjective:    [name], female    DOB: 12/02/1950, 69 y.o.   MRN: [id number]  HPI  Patient is a 69 yr old female who presents today with chief complaint of urinary frequency/urgency.  Reports mild symptoms occurred at the beginning of last week. She will be travelling out of town this week.   Lab Results  Component Value Date   HGBA1C 7.0 (H) 09/06/2019   HGBA1C 6.3 (H) 07/02/2018   HGBA1C 9.5 (H) 03/26/2018   Lab Results  Component Value Date   LDLCALC 57 09/06/2019   CREATININE 0.88 09/06/2019   HTN- On losartan, diltiazem.  Reports that when the weather changes she has a "tightness across my sinus area." Reports that she had a csf leak and made a patch up in the right nostril. She reports that this was performed at [facility] in [place].   Has had pain/pressure in the cheeks and colored drainage.   Review of Systems See HPI  Past Medical History:  Diagnosis Date  . Anemia 04/05/2012  . Anxiety   . Anxiety state 09/11/2007   Qualifier: Diagnosis of  By: Scherrie Gerlach    . Asthma   . Atrial tachycardia, paroxysmal ([place])   . Back pain 10/02/2014  . Breast cancer ([place]) 02/17/2017  . Chicken pox as a child  . Chronic diastolic CHF (congestive heart failure), NYHA class 1 ([place])   . Complication of anesthesia    pt states feels different in her body after anesthesia when waking up and also experiences a smell of burnt plastic for approx a wk   . Constipation   . Depression   . Diabetes ([place])   . Dilated aortic root ([place])    38mm by echo 06/2019  . Dysuria 02/17/2017  . Elevated LFTs 04/05/2012  . GERD (gastroesophageal reflux disease)   . Goiter   . Heart murmur    hx of one at birth   . History of kidney stones   . History of radiation therapy 10/31/16-12/17/16   left breast 45 Gy in 25 fractions, left breast boost 16 Gy in 8 fractions  . Hx of colonic polyps   . Hyperlipidemia   . Hypertension   . Insomnia 10/08/2016  . Joint pain    . Malignant neoplasm of upper-outer quadrant of left female breast ([place]) 05/14/2016   not with patient  . Measles as a child  . Mumps as a child  . OA (osteoarthritis) of knee 04/05/2012  . Obesity 07/11/2014  . PONV (postoperative nausea and vomiting)   . Preventative health care 09/05/2013  . PVC's (premature ventricular contractions) 05/02/2016  . Shortness of breath dyspnea    walking distances / climbing stairs  . Sleep apnea 04/05/2012  . Tinea corporis 02/23/2013  . Type 2 diabetes mellitus with hyperglycemia ([place]) 12/31/2013  . Vasomotor rhinitis 04/05/2012  . Ventral hernia   . Wheezing      Social History   Socioeconomic History  . Marital status: Divorced    Spouse name: Not on file  . Number of children: 3  . Years of education: Not on file  . Highest education level: Not on file  Occupational History  . Occupation: retired - [employer]  Tobacco Use  . Smoking status: Never Smoker  . Smokeless tobacco: Never Used  Vaping Use  . Vaping Use: Never used  Substance and Sexual Activity  . Alcohol use: Not Currently    Comment: rarely  .  Drug use: No  . Sexual activity: Never    Comment: lives with mother currently-stress  Other Topics Concern  . Not on file  Social History Narrative  . Not on file   Social Determinants of Health   Financial Resource Strain: Low Risk   . Difficulty of Paying Living Expenses: Not hard at all  Food Insecurity: No Food Insecurity  . Worried About Charity fundraiser in the Last Year: Never true  . Ran Out of Food in the Last Year: Never true  Transportation Needs: No Transportation Needs  . Lack of Transportation (Medical): No  . Lack of Transportation (Non-Medical): No  Physical Activity:   . Days of Exercise per Week: Not on file  . Minutes of Exercise per Session: Not on file  Stress: No Stress Concern Present  . Feeling of Stress : Not at all  Social Connections:   . Frequency of Communication with Friends and Family:  Not on file  . Frequency of Social Gatherings with Friends and Family: Not on file  . Attends Religious Services: Not on file  . Active Member of Clubs or Organizations: Not on file  . Attends Archivist Meetings: Not on file  . Marital Status: Not on file  Intimate Partner Violence:   . Fear of Current or Ex-Partner: Not on file  . Emotionally Abused: Not on file  . Physically Abused: Not on file  . Sexually Abused: Not on file    Past Surgical History:  Procedure Laterality Date  . BREAST LUMPECTOMY Left 2017  . BREAST LUMPECTOMY WITH RADIOACTIVE SEED AND SENTINEL LYMPH NODE BIOPSY Left 06/21/2016   Procedure: LEFT BREAST LUMPECTOMY WITH RADIOACTIVE SEED AND SENTINEL LYMPH NODE BIOPSY, INJECT BLUE DYE LEFT BREAST;  Surgeon: Fanny Skates, MD;  Location: [facility];  Service: General;  Laterality: Left;  . CARDIAC CATHETERIZATION     normal coroary arteries per patient  . CARDIOVASCULAR STRESS TEST     10/12/2013  . CESAREAN SECTION     X 3  . CHOLECYSTECTOMY    . COLONOSCOPY WITH PROPOFOL N/A 03/28/2015   Procedure: COLONOSCOPY WITH PROPOFOL;  Surgeon: Juanita Craver, MD;  Location: [facility];  Service: Endoscopy;  Laterality: N/A;  . HERNIA REPAIR  02/08/11   ventral hernia  . JOINT REPLACEMENT     bilateral  . KNEE ARTHROSCOPY  05/2010   bilateral  . LEFT AND RIGHT HEART CATHETERIZATION WITH CORONARY ANGIOGRAM N/A 11/08/2013   Procedure: LEFT AND RIGHT HEART CATHETERIZATION WITH CORONARY ANGIOGRAM;  Surgeon: [name], MD;  Location: [facility];  Service: Cardiovascular;  Laterality: N/A;  . MENISCUS REPAIR  2009  . MOUTH SURGERY     teeth implants  . PORT-A-CATH REMOVAL N/A 01/24/2017   Procedure: REMOVAL PORT-A-CATH;  Surgeon: Fanny Skates, MD;  Location: [facility];  Service: General;  Laterality: N/A;  . PORTACATH PLACEMENT Right 07/16/2016   Procedure: INSERTION PORT-A-CATH RIGHT INTERNAL JUGULAR WITH ULTRASOUND;  Surgeon: Fanny Skates, MD;  Location: [facility];  Service: General;  Laterality: Right;  . TOTAL KNEE ARTHROPLASTY  2011   left  . TOTAL KNEE ARTHROPLASTY Right 05/10/2014   Procedure: RIGHT TOTAL KNEE ARTHROPLASTY;  Surgeon: Mauri Pole, MD;  Location: [facility];  Service: Orthopedics;  Laterality: Right;  . TUBAL LIGATION    . WISDOM TOOTH EXTRACTION  2000    Family History  Problem Relation Age of Onset  . Thyroid disease Mother   . Hyperlipidemia Mother   .  Depression Mother   . Anxiety disorder Mother   . Heart attack Father 50  . Hypertension Father   . Arthritis Father        RA  . Coronary artery disease Father   . High Cholesterol Father   . Coronary artery disease Brother   . Heart disease Brother   . Cancer Maternal Aunt        colon  . Cancer Maternal Grandmother        colon  . Heart attack Maternal Grandfather   . Alcohol abuse Paternal Grandfather     Allergies  Allergen Reactions  . No Known Allergies     Current Outpatient Medications on File Prior to Visit  Medication Sig Dispense Refill  . acetaminophen (TYLENOL) 500 MG tablet Take 1,000 mg by mouth 2 (two) times daily as needed for moderate pain or headache.    . anastrozole (ARIMIDEX) 1 MG tablet Take 1 tablet (1 mg total) by mouth daily. 90 tablet 3  . BREO ELLIPTA 100-25 MCG/INH AEPB INHALE 1 PUFF INTO LUNGS ONCE A DAY 60 each 3  . carvedilol (COREG) 6.25 MG tablet Take 1 tablet (6.25 mg total) by mouth 2 (two) times daily. 180 tablet 1  . diltiazem (CARDIZEM CD) 120 MG 24 hr capsule Take 1 capsule (120 mg total) by mouth daily. 90 capsule 3  . Docusate Calcium (STOOL SOFTENER PO) Take by mouth daily.    Marland Kitchen escitalopram (LEXAPRO) 20 MG tablet Take 20 mg po in am and 10 mg po in pm 135 tablet 1  . esomeprazole (NEXIUM) 40 MG capsule Take 1 capsule (40 mg total) by mouth daily. 90 capsule 3  . ezetimibe (ZETIA) 10 MG tablet Take 1 tablet (10 mg total) by mouth daily. 90 tablet 3  . fluticasone (FLONASE) 50 MCG/ACT nasal spray Place 2 sprays  into both nostrils daily. 16 g 6  . furosemide (LASIX) 20 MG tablet Take 1 tablet (20 mg total) by mouth daily. 90 tablet 3  . glucose blood (ACCU-CHEK AVIVA) test strip Twice daily (please use brand that patient insurance covers) 100 each 5  . Hypromellose (ARTIFICIAL TEARS OP) Place 1 drop into both eyes daily as needed (dry eyes).     Marland Kitchen loratadine (CLARITIN) 10 MG tablet Take 1 tablet (10 mg total) by mouth daily. 30 tablet 11  . losartan (COZAAR) 100 MG tablet Take 1 tablet (100 mg total) by mouth daily. 90 tablet 3  . meclizine (ANTIVERT) 25 MG tablet Take 25 mg by mouth every 6 (six) hours as needed.    . metFORMIN (GLUCOPHAGE) 500 MG tablet Take 1 tablet by mouth 2 times daily with a meal. 180 tablet 0  . montelukast (SINGULAIR) 10 MG tablet Take 1 tablet (10 mg total) by mouth at bedtime. 90 tablet 3  . rosuvastatin (CRESTOR) 40 MG tablet Take 1 tablet (40 mg total) by mouth daily. 90 tablet 3  . VENTOLIN HFA 108 (90 Base) MCG/ACT inhaler INHALE 2 PUFFS INTO THE LUNGS EVERY 6 HOURS AS NEEDED FOR WHEEZING INTO RECTUM SHORTNESS OF BREATH 18 g 12  . Vitamin D, Ergocalciferol, (DRISDOL) 1.25 MG (50000 UNIT) CAPS capsule Take 1 capsule (50,000 Units total) by mouth every 7 (seven) days. 4 capsule 4   No current facility-administered medications on file prior to visit.    BP (!) 181/97 (BP Location: Right Arm, Patient Position: Sitting, Cuff Size: Large)   Pulse 88   Temp (!) 96.6 F (35.9 C) (Temporal)  Resp 18   Ht 5\' 6"  (1.676 m)   Wt (!) 315 lb (142.9 kg)   SpO2 98%   BMI 50.84 kg/m   `    Objective:   Physical Exam Constitutional:      Appearance: She is well-developed.  HENT:     Right Ear: Tympanic membrane and ear canal normal.     Left Ear: Tympanic membrane and ear canal normal.     Nose:     Right Sinus: Maxillary sinus tenderness present. No frontal sinus tenderness.     Left Sinus: Maxillary sinus tenderness present. No frontal sinus tenderness.  Neck:      Thyroid: No thyromegaly.  Cardiovascular:     Rate and Rhythm: Normal rate and regular rhythm.     Heart sounds: Normal heart sounds. No murmur heard.   Pulmonary:     Effort: Pulmonary effort is normal. No respiratory distress.     Breath sounds: Normal breath sounds. No wheezing.  Musculoskeletal:     Cervical back: Neck supple.  Skin:    General: Skin is warm and dry.  Neurological:     Mental Status: She is alert and oriented to person, place, and time.  Psychiatric:        Behavior: Behavior normal.        Thought Content: Thought content normal.        Judgment: Judgment normal.           Assessment & Plan:  UTI-  UA notes large leukocytes.  Will send urine for culture and begin empiric rx with ceftin.  Sinusitis- rx with 10 day course of ceftin.  HTN- uncontrolled. Will increase coreg to 12.5mg  bid.   Pt is advised to call if symptoms worsen or if symptoms fail to improve. She is also advised to follow up with PCP in 2 weeks for blood pressure recheck.   This visit occurred during the SARS-CoV-2 public health emergency.  Safety protocols were in place, including screening questions prior to the visit, additional usage of staff PPE, and extensive cleaning of exam room while observing appropriate contact time as indicated for disinfecting solutions.

## 2020-05-15 NOTE — Patient Instructions (Addendum)
Please increase your carvedilol to 12.5mg  twice daily. Please begin ceftin twice daily (antibiotic). This should cover urinary tract infection as well as sinusitis.   Please call if symptoms worsen or if you are not improved in 3 days.

## 2020-05-16 ENCOUNTER — Encounter: Payer: Self-pay | Admitting: Family

## 2020-05-16 LAB — URINE CULTURE
MICRO NUMBER:: 11232753
SPECIMEN QUALITY:: ADEQUATE

## 2020-05-22 ENCOUNTER — Ambulatory Visit: Payer: Medicare Other

## 2020-05-22 DIAGNOSIS — Z96651 Presence of right artificial knee joint: Secondary | ICD-10-CM | POA: Diagnosis not present

## 2020-05-22 DIAGNOSIS — G4733 Obstructive sleep apnea (adult) (pediatric): Secondary | ICD-10-CM | POA: Diagnosis not present

## 2020-05-23 ENCOUNTER — Telehealth: Payer: Self-pay | Admitting: Pulmonary Disease

## 2020-05-23 NOTE — Telephone Encounter (Signed)
Tried calling the pt and there was no answer- LMTCB.  

## 2020-05-23 NOTE — Telephone Encounter (Addendum)
Call patient  Sleep study result  Date of study: 05/16/2020  Impression: Mild obstructive sleep apnea No significant oxygen desaturations  Recommendation: Options of treatment for mild obstructive sleep apnea would include CPAP therapy  If CPAP therapy is chosen as option of treatment, auto titrating CPAP with pressure settings of 5-15 will be appropriate  An oral device may be considered as an option of treatment-this will involve referral to dentist to fashion an oral device  Watchful waiting with focus on weight loss and sleep position modification may also be appropriate if patient is not symptomatic  Follow-up in 4 to 6 weeks following initiation of treatment       SENT IN ERROR [name]

## 2020-05-24 ENCOUNTER — Other Ambulatory Visit: Payer: Self-pay | Admitting: Family Medicine

## 2020-05-24 NOTE — Telephone Encounter (Signed)
My error  Patient with similar name  Will send new message regarding study to correct patient

## 2020-05-24 NOTE — Telephone Encounter (Signed)
I called the pt to discuss her HST results. She states that she has not had a recent sleep test done and has been on CPAP for years. Will forward back to Dr Ander Slade to see if this was maybe about another pt. Thanks!

## 2020-05-30 ENCOUNTER — Ambulatory Visit: Payer: Medicare Other | Admitting: Family

## 2020-06-02 ENCOUNTER — Other Ambulatory Visit: Payer: Self-pay

## 2020-06-02 ENCOUNTER — Ambulatory Visit (INDEPENDENT_AMBULATORY_CARE_PROVIDER_SITE_OTHER): Payer: Medicare Other | Admitting: Family

## 2020-06-02 VITALS — BP 160/83 | HR 69 | Temp 98.4°F | Resp 16 | Ht 66.0 in | Wt 308.0 lb

## 2020-06-02 DIAGNOSIS — E1165 Type 2 diabetes mellitus with hyperglycemia: Secondary | ICD-10-CM

## 2020-06-02 DIAGNOSIS — I10 Essential (primary) hypertension: Secondary | ICD-10-CM | POA: Diagnosis not present

## 2020-06-02 DIAGNOSIS — G4733 Obstructive sleep apnea (adult) (pediatric): Secondary | ICD-10-CM | POA: Diagnosis not present

## 2020-06-02 DIAGNOSIS — R350 Frequency of micturition: Secondary | ICD-10-CM | POA: Diagnosis not present

## 2020-06-02 DIAGNOSIS — E7800 Pure hypercholesterolemia, unspecified: Secondary | ICD-10-CM | POA: Diagnosis not present

## 2020-06-02 LAB — POC URINALSYSI DIPSTICK (AUTOMATED)
Bilirubin, UA: NEGATIVE
Glucose, UA: NEGATIVE
Ketones, UA: NEGATIVE
Nitrite, UA: NEGATIVE
Protein, UA: POSITIVE — AB
Spec Grav, UA: 1.025 (ref 1.010–1.025)
Urobilinogen, UA: NEGATIVE E.U./dL — AB
pH, UA: 6 (ref 5.0–8.0)

## 2020-06-02 NOTE — Progress Notes (Signed)
Subjective:    [name], female    DOB: Feb 16, 1951, 69 y.o.   MRN: [id number]  HPI  Patient is a 69 yr old female who presents today for follow up.   HTN- last visit bp was elevated and we increased coreg to 12.5mg  bid.  She is also on lasix 20mg , losartan 100mg , diltiazem cd 120mg . She sees Dr. [name] in January. She reports home readings are usually 130's/60's. She thinks that she has some white coat htn.   DM2-  Lab Results  Component Value Date   HGBA1C 7.0 (H) 09/06/2019   HGBA1C 6.3 (H) 07/02/2018   HGBA1C 9.5 (H) 03/26/2018   Lab Results  Component Value Date   LDLCALC 57 09/06/2019   CREATININE 0.88 09/06/2019   Hyperlipidemia-  Lab Results  Component Value Date   CHOL 127 09/06/2019   HDL 31.70 (L) 09/06/2019   LDLCALC 57 09/06/2019   LDLDIRECT 108.0 10/08/2016   TRIG 190.0 (H) 09/06/2019   CHOLHDL 4 09/06/2019   She reports that she had some improvement with ceftin.  Reports + urgency and burning.   Review of Systems See HPI  Past Medical History:  Diagnosis Date  . Anemia 04/05/2012  . Anxiety   . Anxiety state 09/11/2007   Qualifier: Diagnosis of  By: Scherrie Gerlach    . Asthma   . Atrial tachycardia, paroxysmal ([place])   . Back pain 10/02/2014  . Breast cancer ([place]) 02/17/2017  . Chicken pox as a child  . Chronic diastolic CHF (congestive heart failure), NYHA class 1 ([place])   . Complication of anesthesia    pt states feels different in her body after anesthesia when waking up and also experiences a smell of burnt plastic for approx a wk   . Constipation   . Depression   . Diabetes ([place])   . Dilated aortic root ([place])    67mm by echo 06/2019  . Dysuria 02/17/2017  . Elevated LFTs 04/05/2012  . GERD (gastroesophageal reflux disease)   . Goiter   . Heart murmur    hx of one at birth   . History of kidney stones   . History of radiation therapy 10/31/16-12/17/16   left breast 45 Gy in 25 fractions, left breast boost 16 Gy in 8  fractions  . Hx of colonic polyps   . Hyperlipidemia   . Hypertension   . Insomnia 10/08/2016  . Joint pain   . Malignant neoplasm of upper-outer quadrant of left female breast ([place]) 05/14/2016   not with patient  . Measles as a child  . Mumps as a child  . OA (osteoarthritis) of knee 04/05/2012  . Obesity 07/11/2014  . PONV (postoperative nausea and vomiting)   . Preventative health care 09/05/2013  . PVC's (premature ventricular contractions) 05/02/2016  . Shortness of breath dyspnea    walking distances / climbing stairs  . Sleep apnea 04/05/2012  . Tinea corporis 02/23/2013  . Type 2 diabetes mellitus with hyperglycemia ([place]) 12/31/2013  . Vasomotor rhinitis 04/05/2012  . Ventral hernia   . Wheezing      Social History   Socioeconomic History  . Marital status: Divorced    Spouse name: Not on file  . Number of children: 3  . Years of education: Not on file  . Highest education level: Not on file  Occupational History  . Occupation: retired - [employer]  Tobacco Use  . Smoking status: Never Smoker  . Smokeless tobacco: Never  Used  Vaping Use  . Vaping Use: Never used  Substance and Sexual Activity  . Alcohol use: Not Currently    Comment: rarely  . Drug use: No  . Sexual activity: Never    Comment: lives with mother currently-stress  Other Topics Concern  . Not on file  Social History Narrative  . Not on file   Social Determinants of Health   Financial Resource Strain: Low Risk   . Difficulty of Paying Living Expenses: Not hard at all  Food Insecurity: No Food Insecurity  . Worried About Charity fundraiser in the Last Year: Never true  . Ran Out of Food in the Last Year: Never true  Transportation Needs: No Transportation Needs  . Lack of Transportation (Medical): No  . Lack of Transportation (Non-Medical): No  Physical Activity: Not on file  Stress: No Stress Concern Present  . Feeling of Stress : Not at all  Social Connections: Not on file  Intimate  Partner Violence: Not on file    Past Surgical History:  Procedure Laterality Date  . BREAST LUMPECTOMY Left 2017  . BREAST LUMPECTOMY WITH RADIOACTIVE SEED AND SENTINEL LYMPH NODE BIOPSY Left 06/21/2016   Procedure: LEFT BREAST LUMPECTOMY WITH RADIOACTIVE SEED AND SENTINEL LYMPH NODE BIOPSY, INJECT BLUE DYE LEFT BREAST;  Surgeon: Fanny Skates, MD;  Location: [facility];  Service: General;  Laterality: Left;  . CARDIAC CATHETERIZATION     normal coroary arteries per patient  . CARDIOVASCULAR STRESS TEST     10/12/2013  . CESAREAN SECTION     X 3  . CHOLECYSTECTOMY    . COLONOSCOPY WITH PROPOFOL N/A 03/28/2015   Procedure: COLONOSCOPY WITH PROPOFOL;  Surgeon: Juanita Craver, MD;  Location: [facility];  Service: Endoscopy;  Laterality: N/A;  . HERNIA REPAIR  02/08/11   ventral hernia  . JOINT REPLACEMENT     bilateral  . KNEE ARTHROSCOPY  05/2010   bilateral  . LEFT AND RIGHT HEART CATHETERIZATION WITH CORONARY ANGIOGRAM N/A 11/08/2013   Procedure: LEFT AND RIGHT HEART CATHETERIZATION WITH CORONARY ANGIOGRAM;  Surgeon: [name], MD;  Location: [facility] CATH LAB;  Service: Cardiovascular;  Laterality: N/A;  . MENISCUS REPAIR  2009  . MOUTH SURGERY     teeth implants  . PORT-A-CATH REMOVAL N/A 01/24/2017   Procedure: REMOVAL PORT-A-CATH;  Surgeon: Fanny Skates, MD;  Location: [facility];  Service: General;  Laterality: N/A;  . PORTACATH PLACEMENT Right 07/16/2016   Procedure: INSERTION PORT-A-CATH RIGHT INTERNAL JUGULAR WITH ULTRASOUND;  Surgeon: Fanny Skates, MD;  Location: [facility];  Service: General;  Laterality: Right;  . REPAIR DURAL / CSF LEAK  2021   performed at [facility] in [place]  . TOTAL KNEE ARTHROPLASTY  2011   left  . TOTAL KNEE ARTHROPLASTY Right 05/10/2014   Procedure: RIGHT TOTAL KNEE ARTHROPLASTY;  Surgeon: Mauri Pole, MD;  Location: [facility];  Service: Orthopedics;  Laterality: Right;  . TUBAL LIGATION    . WISDOM TOOTH EXTRACTION  2000    Family  History  Problem Relation Age of Onset  . Thyroid disease Mother   . Hyperlipidemia Mother   . Depression Mother   . Anxiety disorder Mother   . Heart attack Father 52  . Hypertension Father   . Arthritis Father        RA  . Coronary artery disease Father   . High Cholesterol Father   . Coronary artery disease Brother   . Heart disease Brother   .  Cancer Maternal Aunt        colon  . Cancer Maternal Grandmother        colon  . Heart attack Maternal Grandfather   . Alcohol abuse Paternal Grandfather     Allergies  Allergen Reactions  . No Known Allergies     Current Outpatient Medications on File Prior to Visit  Medication Sig Dispense Refill  . acetaminophen (TYLENOL) 500 MG tablet Take 1,000 mg by mouth 2 (two) times daily as needed for moderate pain or headache.    . anastrozole (ARIMIDEX) 1 MG tablet Take 1 tablet (1 mg total) by mouth daily. 90 tablet 3  . BREO ELLIPTA 100-25 MCG/INH AEPB INHALE 1 PUFF INTO LUNGS ONCE A DAY 60 each 3  . carvedilol (COREG) 12.5 MG tablet Take 1 tablet (12.5 mg total) by mouth 2 (two) times daily with a meal. 60 tablet 2  . diclofenac (VOLTAREN) 75 MG EC tablet Take 1 tablet (75 mg total) by mouth 2 (two) times daily. 180 tablet 0  . diltiazem (CARDIZEM CD) 120 MG 24 hr capsule Take 1 capsule (120 mg total) by mouth daily. 90 capsule 3  . Docusate Calcium (STOOL SOFTENER PO) Take by mouth daily.    Marland Kitchen escitalopram (LEXAPRO) 20 MG tablet Take 20 mg po in am and 10 mg po in pm 135 tablet 1  . esomeprazole (NEXIUM) 40 MG capsule Take 1 capsule (40 mg total) by mouth daily. 90 capsule 3  . ezetimibe (ZETIA) 10 MG tablet Take 1 tablet (10 mg total) by mouth daily. 90 tablet 3  . fluticasone (FLONASE) 50 MCG/ACT nasal spray Place 2 sprays into both nostrils daily. 16 g 6  . furosemide (LASIX) 20 MG tablet Take 1 tablet (20 mg total) by mouth daily. 90 tablet 3  . glucose blood (ACCU-CHEK AVIVA) test strip Twice daily (please use brand that patient  insurance covers) 100 each 5  . Hypromellose (ARTIFICIAL TEARS OP) Place 1 drop into both eyes daily as needed (dry eyes).     Marland Kitchen loratadine (CLARITIN) 10 MG tablet Take 1 tablet (10 mg total) by mouth daily. 30 tablet 11  . losartan (COZAAR) 100 MG tablet Take 1 tablet (100 mg total) by mouth daily. 90 tablet 3  . meclizine (ANTIVERT) 25 MG tablet Take 25 mg by mouth every 6 (six) hours as needed.    . metFORMIN (GLUCOPHAGE) 500 MG tablet Take 1 tablet by mouth 2 times daily with a meal. 180 tablet 0  . montelukast (SINGULAIR) 10 MG tablet Take 1 tablet (10 mg total) by mouth at bedtime. 90 tablet 3  . rosuvastatin (CRESTOR) 40 MG tablet Take 1 tablet (40 mg total) by mouth daily. 90 tablet 3  . VENTOLIN HFA 108 (90 Base) MCG/ACT inhaler INHALE 2 PUFFS INTO THE LUNGS EVERY 6 HOURS AS NEEDED FOR WHEEZING INTO RECTUM SHORTNESS OF BREATH 18 g 12  . Vitamin D, Ergocalciferol, (DRISDOL) 1.25 MG (50000 UNIT) CAPS capsule Take 1 capsule (50,000 Units total) by mouth every 7 (seven) days. 4 capsule 4   No current facility-administered medications on file prior to visit.    BP (!) 160/83 (BP Location: Right Arm, Patient Position: Sitting, Cuff Size: Large)   Pulse 69   Temp 98.4 F (36.9 C) (Oral)   Resp 16   Ht 5\' 6"  (1.676 m)   Wt (!) 308 lb (139.7 kg)   SpO2 97%   BMI 49.71 kg/m       Objective:  Physical Exam Constitutional:      Appearance: She is well-developed and well-nourished.  Neck:     Thyroid: No thyromegaly.  Cardiovascular:     Rate and Rhythm: Normal rate and regular rhythm.     Heart sounds: Normal heart sounds. No murmur heard.   Pulmonary:     Effort: Pulmonary effort is normal. No respiratory distress.     Breath sounds: Normal breath sounds. No wheezing.  Musculoskeletal:     Cervical back: Neck supple.  Skin:    General: Skin is warm and dry.  Neurological:     Mental Status: She is alert and oriented to person, place, and time.  Psychiatric:        Mood  and Affect: Mood and affect normal.        Behavior: Behavior normal.        Thought Content: Thought content normal.        Judgment: Judgment normal.           Assessment & Plan:  Urinary frequency- Check ua and culture.  Last visit culture was negative. Could be related to uncontrolled DM2.  HTN- bp is improved but still above goal.  I am hesitant to increase her diltiazem due to HR in the 60's.  She has an upcoming appointment with her cardiologist- will defer further med changes to cardiology.  DM2- uncontrolled.  Will increase her metformin from 500mg  bid to 1000mg  bid.  See phone note.  Lab Results  Component Value Date   HGBA1C 9.5 (H) 06/02/2020   HGBA1C 7.0 (H) 09/06/2019   HGBA1C 6.3 (H) 07/02/2018   Lab Results  Component Value Date   LDLCALC 67 06/02/2020   CREATININE 0.74 06/02/2020   Hyperlipidemia- LDL at goal.  Continue crestor 40mg  once daily.  Lab Results  Component Value Date   CHOL 141 06/02/2020   HDL 40 (L) 06/02/2020   LDLCALC 67 06/02/2020   LDLDIRECT 108.0 10/08/2016   TRIG 246 (H) 06/02/2020   CHOLHDL 3.5 06/02/2020   This visit occurred during the SARS-CoV-2 public health emergency.  Safety protocols were in place, including screening questions prior to the visit, additional usage of staff PPE, and extensive cleaning of exam room while observing appropriate contact time as indicated for disinfecting solutions.

## 2020-06-02 NOTE — Patient Instructions (Signed)
Please complete lab work prior to leaving. We will let you know how your urine culture turns out.   Please keep your upcoming appointment with Dr. [name].

## 2020-06-03 ENCOUNTER — Telehealth: Payer: Self-pay | Admitting: Family

## 2020-06-03 LAB — COMPREHENSIVE METABOLIC PANEL
AG Ratio: 1.4 (calc) (ref 1.0–2.5)
ALT: 32 U/L — ABNORMAL HIGH (ref 6–29)
AST: 40 U/L — ABNORMAL HIGH (ref 10–35)
Albumin: 4.3 g/dL (ref 3.6–5.1)
Alkaline phosphatase (APISO): 105 U/L (ref 37–153)
BUN: 13 mg/dL (ref 7–25)
CO2: 28 mmol/L (ref 20–32)
Calcium: 9.8 mg/dL (ref 8.6–10.4)
Chloride: 100 mmol/L (ref 98–110)
Creat: 0.74 mg/dL (ref 0.50–0.99)
Globulin: 3 g/dL (calc) (ref 1.9–3.7)
Glucose, Bld: 190 mg/dL — ABNORMAL HIGH (ref 65–99)
Potassium: 4.5 mmol/L (ref 3.5–5.3)
Sodium: 139 mmol/L (ref 135–146)
Total Bilirubin: 0.6 mg/dL (ref 0.2–1.2)
Total Protein: 7.3 g/dL (ref 6.1–8.1)

## 2020-06-03 LAB — URINE CULTURE
MICRO NUMBER:: 11303339
SPECIMEN QUALITY:: ADEQUATE

## 2020-06-03 LAB — HEMOGLOBIN A1C
Hgb A1c MFr Bld: 9.5 % of total Hgb — ABNORMAL HIGH (ref <5.7)
Mean Plasma Glucose: 226 mg/dL
eAG (mmol/L): 12.5 mmol/L

## 2020-06-03 LAB — LIPID PANEL
Cholesterol: 141 mg/dL (ref <200)
HDL: 40 mg/dL — ABNORMAL LOW (ref >=50)
LDL Cholesterol (Calc): 67 mg/dL (calc)
Non-HDL Cholesterol (Calc): 101 mg/dL (calc) (ref <130)
Total CHOL/HDL Ratio: 3.5 (calc) (ref <5.0)
Triglycerides: 246 mg/dL — ABNORMAL HIGH (ref <150)

## 2020-06-03 MED ORDER — METFORMIN HCL 500 MG PO TABS: 1000.0000 mg | ORAL_TABLET | Freq: Two times a day (BID) | ORAL | 0 refills | Status: DC

## 2020-06-03 NOTE — Telephone Encounter (Signed)
Diabetes is uncontrolled.  I would like for her to increase her metformin to 1000mg  (2 tabs) twice daily and work hard on diet/exercise and weight loss.  New rx has been sent to her pharmacy.

## 2020-06-04 NOTE — Telephone Encounter (Signed)
Also, urine culture is negative for infection- it just shows skin contamination.  I think that your urinary frequency is likely related to her high sugars.  Hopefully when your sugars are better your urinary symptoms will also improve.

## 2020-06-05 ENCOUNTER — Other Ambulatory Visit: Payer: Self-pay | Admitting: Family Medicine

## 2020-06-05 NOTE — Telephone Encounter (Signed)
Patient advised of results, new dose and provider's advise. She verbalized understanding.

## 2020-06-15 ENCOUNTER — Ambulatory Visit: Payer: Medicare Other | Admitting: Internal Medicine

## 2020-06-18 NOTE — Progress Notes (Signed)
HPI female never smoker followed for OSA, lung restriction, dyspnea on exertion, asthmatic bronchitis, complicated by chronic sinusitis, anxiety and depression, paroxysmal atrial tachycardia, CAD, breast cancer left, morbid obesity Unattended Home Sleep Test 11/05/13-AHI 68.1/hour, desaturation to 62%, body weight 320 pounds Echocardiogram 12/04/16- Nl EF Office Spirometry 02/06/17-moderate restriction of exhaled volume. FVC 2.02/61%, FEV1 1.75/69%, ratio 0.86, FEF 25-75% 2.45/113% PFT- 03/12/17-moderate obstructive airways disease with significant response to bronchodilator, normal diffusion. FVC 2.25/69%, FEV1 1.88/76%, ratio 0.84, TLC 80%, DLCO 86% --------------------------------------------------------------------------------------------.  06/15/2019- Virtual Visit via Telephone Note History of Present Illness: 69 year old female never smoker followed for OSA, lung restriction, dyspnea on exertion, asthmatic bronchitis, complicated by chronic sinusitis, anxiety and depression, paroxysmal atrial tachycardia, CAD, breast cancer left, morbid obesity, goiter,  CPAP auto 5-15/Adapt Download compliance 100%, AHI 0.4/ hr Albuterol hfa, Breo 100, singulair Had flu vax Hasn't needed rescue hfa in over a month. Not using Breo. Chest feels ok. Admits some nasal congestion.   Observations/Objective:   Assessment and Plan: Asthmatic Bronchitis- mild intermittent OSA Continue 5-15 Rhinosinusitis- Flonase  Follow Up Instructions: 6 months     06/19/20- 69 year old female never smoker followed for OSA, lung restriction, dyspnea on exertion, asthmatic bronchitis, complicated by chronic sinusitis, anxiety and depression, paroxysmal atrial tachycardia, CAD, breast cancer left, morbid obesity, Goiter, CSF leak [facility] with ENT f/u at [facility],  CPAP auto 5-15/Adapt Albuterol hfa, Breo 100, singulair Download- 97%, AHI 2.1/ hr Body weight today- 312 lbs Covid vax-3 Moderna Flu vax-  had Recently able to resume CPAP use after sgy for CSF leak. Considers CPAP a big help for sleep.  Asthma- . Notes DOE- ok at rest- since surgery in June. Some wheeze- rescue hfa does help. L leg swells. No chest pain.No hx VTE. Sees Dr Turner./ cardiology. Hx chemo and XRT L breast.  . ROS-see HPI   + = Positive Constitutional:    +weight loss, night sweats, fevers, chills, fatigue, lassitude. HEENT:   + headaches, difficulty swallowing, tooth/dental problems, sore throat,       sneezing, itching, ear ache, + nasal congestion, post nasal drip, snoring CV:    chest pain, orthopnea, PND, swelling in lower extremities, anasarca,                                                  dizziness, palpitations Resp:   + shortness of breath with exertion or at rest.                productive cough,   non-productive cough, coughing up of blood.              change in color of mucus.  + wheezing.   Skin:    rash or lesions. GI:  No-   heartburn, indigestion, abdominal pain, nausea, vomiting, diarrhea,                 change in bowel habits, loss of appetite GU: dysuria, change in color of urine, no urgency or frequency.   flank pain. MS:   joint pain, stiffness, decreased range of motion, back pain. Neuro-     nothing unusual Psych:  change in mood or affect.  depression or anxiety.   memory loss.  OBJ- Physical Exam General- Alert, Oriented, Affect-appropriate, Distress- none acute, + morbidly obese Skin- rash-none, lesions- none, excoriation- none Lymphadenopathy-  none Head- atraumatic,             Eyes- Gross vision intact, PERRLA, conjunctivae and secretions clear            Ears- Hearing, canals-normal            Nose-  turbinate edema, no-Septal dev, mucus, polyps, erosion, perforation,            Throat- Mallampati III , mucosa clear , drainage- none, tonsils- atrophic, + stridor Neck- flexible , trachea midline , thyroid R mass identified as" goiter" , carotid no bruit Chest - symmetrical  excursion , unlabored           Heart/CV- RRR , no murmur , no gallop  , no rub, nl s1 s2                           - JVD- none , edema- none, stasis changes- none, varices- none           Lung- clear,  wheeze- none, cough- none , dullness-none, rub- none           Chest wall-  Abd-  Br/ Gen/ Rectal- Not done, not indicated Extrem- cyanosis- none, clubbing, none, atrophy- none, strength- nl Neuro- grossly intact to observation

## 2020-06-19 ENCOUNTER — Other Ambulatory Visit: Payer: Self-pay

## 2020-06-19 ENCOUNTER — Ambulatory Visit: Payer: Medicare Other | Admitting: Internal Medicine

## 2020-06-19 ENCOUNTER — Encounter: Payer: Self-pay | Admitting: Internal Medicine

## 2020-06-19 VITALS — BP 150/88 | HR 74 | Temp 97.2°F | Ht 66.0 in | Wt 312.4 lb

## 2020-06-19 DIAGNOSIS — G4733 Obstructive sleep apnea (adult) (pediatric): Secondary | ICD-10-CM

## 2020-06-19 DIAGNOSIS — R0600 Dyspnea, unspecified: Secondary | ICD-10-CM | POA: Diagnosis not present

## 2020-06-19 DIAGNOSIS — R0609 Other forms of dyspnea: Secondary | ICD-10-CM

## 2020-06-19 DIAGNOSIS — R221 Localized swelling, mass and lump, neck: Secondary | ICD-10-CM

## 2020-06-19 DIAGNOSIS — R061 Stridor: Secondary | ICD-10-CM

## 2020-06-19 DIAGNOSIS — E049 Nontoxic goiter, unspecified: Secondary | ICD-10-CM

## 2020-06-19 DIAGNOSIS — J4520 Mild intermittent asthma, uncomplicated: Secondary | ICD-10-CM

## 2020-06-19 DIAGNOSIS — R609 Edema, unspecified: Secondary | ICD-10-CM

## 2020-06-19 LAB — CBC WITH DIFFERENTIAL/PLATELET
Basophils Absolute: 0 10*3/uL (ref 0.0–0.1)
Basophils Relative: 0.3 % (ref 0.0–3.0)
Eosinophils Absolute: 0.1 10*3/uL (ref 0.0–0.7)
Eosinophils Relative: 1 % (ref 0.0–5.0)
HCT: 38.3 % (ref 36.0–46.0)
Hemoglobin: 12 g/dL (ref 12.0–15.0)
Lymphocytes Relative: 19.6 % (ref 12.0–46.0)
Lymphs Abs: 1.4 10*3/uL (ref 0.7–4.0)
MCHC: 31.4 g/dL (ref 30.0–36.0)
MCV: 78.8 fl (ref 78.0–100.0)
Monocytes Absolute: 0.5 10*3/uL (ref 0.1–1.0)
Monocytes Relative: 6.3 % (ref 3.0–12.0)
Neutro Abs: 5.2 10*3/uL (ref 1.4–7.7)
Neutrophils Relative %: 72.8 % (ref 43.0–77.0)
Platelets: 255 10*3/uL (ref 150.0–400.0)
RBC: 4.86 Mil/uL (ref 3.87–5.11)
RDW: 17.5 % — ABNORMAL HIGH (ref 11.5–15.5)
WBC: 7.2 10*3/uL (ref 4.0–10.5)

## 2020-06-19 LAB — BRAIN NATRIURETIC PEPTIDE: Pro B Natriuretic peptide (BNP): 131 pg/mL — ABNORMAL HIGH (ref 0.0–100.0)

## 2020-06-19 LAB — D-DIMER, QUANTITATIVE: D-Dimer, Quant: 0.62 mcg/mL FEU — ABNORMAL HIGH (ref <0.50)

## 2020-06-19 MED ORDER — ALBUTEROL SULFATE HFA 108 (90 BASE) MCG/ACT IN AERS: INHALATION_SPRAY | RESPIRATORY_TRACT | 12 refills | Status: AC

## 2020-06-19 MED ORDER — BREO ELLIPTA 100-25 MCG/INH IN AEPB: INHALATION_SPRAY | RESPIRATORY_TRACT | 12 refills | Status: DC

## 2020-06-19 NOTE — Patient Instructions (Addendum)
Order- lab-  D dimer, BNP, BMET, CBC w diff      Dx Dyspnea  Order- CTa chest  PE protocol   Dx Dyspnea  Order- CT neck   Dx goiter, stridor  Order- Doppler ultrasound bilateral legs   Dx edema left leg  Order- schedule PFT    Dx dyspnea on exertion  Refill scripts sent for Breo  And your albuterol rescue inhaler

## 2020-06-21 DIAGNOSIS — G4733 Obstructive sleep apnea (adult) (pediatric): Secondary | ICD-10-CM | POA: Diagnosis not present

## 2020-06-21 DIAGNOSIS — Z96651 Presence of right artificial knee joint: Secondary | ICD-10-CM | POA: Diagnosis not present

## 2020-06-22 ENCOUNTER — Ambulatory Visit (HOSPITAL_COMMUNITY)
Admission: RE | Admit: 2020-06-22 | Discharge: 2020-06-22 | Disposition: A | Discharge status: Home / Self Care | Payer: Medicare Other | Source: Ambulatory Visit | Attending: Internal Medicine | Admitting: Internal Medicine

## 2020-06-22 ENCOUNTER — Telehealth: Payer: Self-pay | Admitting: *Deleted

## 2020-06-22 ENCOUNTER — Ambulatory Visit
Admission: RE | Admit: 2020-06-22 | Discharge: 2020-06-22 | Disposition: A | Discharge status: Home / Self Care | Payer: Medicare Other | Source: Ambulatory Visit | Attending: Internal Medicine | Admitting: Internal Medicine

## 2020-06-22 ENCOUNTER — Other Ambulatory Visit: Payer: Self-pay

## 2020-06-22 ENCOUNTER — Ambulatory Visit (INDEPENDENT_AMBULATORY_CARE_PROVIDER_SITE_OTHER)
Admission: RE | Admit: 2020-06-22 | Discharge: 2020-06-22 | Disposition: A | Discharge status: Home / Self Care | Payer: Medicare Other | Source: Ambulatory Visit | Attending: Internal Medicine | Admitting: Internal Medicine

## 2020-06-22 DIAGNOSIS — R0600 Dyspnea, unspecified: Secondary | ICD-10-CM

## 2020-06-22 DIAGNOSIS — R061 Stridor: Secondary | ICD-10-CM

## 2020-06-22 DIAGNOSIS — E049 Nontoxic goiter, unspecified: Secondary | ICD-10-CM

## 2020-06-22 DIAGNOSIS — R609 Edema, unspecified: Secondary | ICD-10-CM | POA: Diagnosis not present

## 2020-06-22 DIAGNOSIS — E042 Nontoxic multinodular goiter: Secondary | ICD-10-CM | POA: Diagnosis not present

## 2020-06-22 DIAGNOSIS — R062 Wheezing: Secondary | ICD-10-CM | POA: Diagnosis not present

## 2020-06-22 DIAGNOSIS — I2699 Other pulmonary embolism without acute cor pulmonale: Secondary | ICD-10-CM | POA: Diagnosis not present

## 2020-06-22 DIAGNOSIS — I313 Pericardial effusion (noninflammatory): Secondary | ICD-10-CM | POA: Diagnosis not present

## 2020-06-22 DIAGNOSIS — R0602 Shortness of breath: Secondary | ICD-10-CM | POA: Diagnosis not present

## 2020-06-22 MED ORDER — IOHEXOL 350 MG/ML SOLN
80.0000 mL | Freq: Once | INTRAVENOUS | Status: AC | PRN
  Administered 2020-06-22: 80 mL via INTRAVENOUS

## 2020-06-22 NOTE — Telephone Encounter (Signed)
Received call report from Molli Hazard with [facility] on [name] on patient's doppler studies done on today 06/22/20. CY please review the result/impression copied below:  [name] is negative for DVT  Please advise, thank you.

## 2020-06-24 HISTORY — PX: TOTAL THYROIDECTOMY: SHX2547

## 2020-06-26 NOTE — Telephone Encounter (Signed)
The only ENT referral that I see is from Dr Mariel Aloe office back in July

## 2020-06-26 NOTE — Telephone Encounter (Signed)
Spoke with the pt to make sure she got an ENT appt  She states that she does have one scheduled at [facility] with Dr Juleen China  She is aware studies we order are neg for PE  She is asking if there is any abnormalities on the CT  Please advise, thanks!

## 2020-06-26 NOTE — Telephone Encounter (Signed)
Will ask the [facility]'s to look at this for you Dr. Maple Hudson

## 2020-06-26 NOTE — Telephone Encounter (Signed)
Dr. Maple Hudson and I called and spoke with patient about results and all questions were answered. Patient states that she see's Dr. Juleen China at [facility] CT results for chest and neck have been faxed to his office to make him aware. Nothing further needed at this time.

## 2020-06-26 NOTE — Telephone Encounter (Signed)
Yes- there may be increased blood pressure in the arteries of the lung (pulmonary hypertension)- which can be looked at later.         There are calcium deposits in the walls of the arteries (atherosclerosis). This doesn't tell us if there is any blockage in arteries. Can discuss this with your primary doctor.         There may be some scarring in the liver (cirrhosis). You can also discuss this with your primary doctor.

## 2020-06-26 NOTE — Telephone Encounter (Signed)
CT and doppler leg vein studies were negative for PE.  I have referred her for ENT evaluation of stridor due to enlarged thyroid.  Please make sure that referral is in the works- thanks.

## 2020-07-04 ENCOUNTER — Ambulatory Visit: Payer: Medicare Other

## 2020-07-04 ENCOUNTER — Other Ambulatory Visit: Payer: Self-pay

## 2020-07-04 NOTE — Patient Outreach (Signed)
[place] [facility]) Care Management  [place]  07/04/2020   [name] [birth year] [id number]  Subjective: Telephone call to patient for disease management follow up. Patient reports she is doing ok. 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 Patient reports she has not been watching her diabetes as closely as she has been dealing with her sinus issues and surgery. Patient A1c 9.5 presently.  Discussed returning to diabetes management and healthier eating. She verbalized understanding.  Patient now has [facility] as her primary insurance. Discussed transfer to health coach. Patient agreeable.   Objective:   Encounter Medications:  Outpatient Encounter Medications as of 07/04/2020  Medication Sig  . acetaminophen (TYLENOL) 500 MG tablet Take 1,000 mg by mouth 2 (two) times daily as needed for moderate pain or headache.  . albuterol (VENTOLIN HFA) 108 (90 Base) MCG/ACT inhaler Inhale 2 puffs every 4-6 hours as needed - rescue.  Marland Kitchen anastrozole (ARIMIDEX) 1 MG tablet Take 1 tablet (1 mg total) by mouth daily.  . carvedilol (COREG) 12.5 MG tablet Take 1 tablet (12.5 mg total) by mouth 2 (two) times daily with a meal.  . diclofenac (VOLTAREN) 75 MG EC tablet Take 1 tablet (75 mg total) by mouth 2 (two) times daily.  Marland Kitchen diltiazem (CARDIZEM CD) 120 MG 24 hr capsule Take 1 capsule (120 mg total) by mouth daily.  Mariane Baumgarten Calcium (STOOL SOFTENER PO) Take by mouth daily.  Marland Kitchen escitalopram (LEXAPRO) 20 MG tablet TAKE 1 TABLET EVERY MORNING AND 1/2 EVERY EVENING  . esomeprazole (NEXIUM) 40 MG capsule Take 1 capsule (40 mg total) by mouth daily.  Marland Kitchen ezetimibe (ZETIA) 10 MG tablet Take 1 tablet (10 mg total) by mouth daily.  . fluticasone (FLONASE) 50 MCG/ACT nasal spray Place 2 sprays into both nostrils daily.  . fluticasone furoate-vilanterol (BREO ELLIPTA) 100-25 MCG/INH AEPB Inhale 1 puff then rinse mouth, once daily  . furosemide  (LASIX) 20 MG tablet Take 1 tablet (20 mg total) by mouth daily.  Marland Kitchen glucose blood (ACCU-CHEK AVIVA) test strip Twice daily (please use brand that patient insurance covers)  . Hypromellose (ARTIFICIAL TEARS OP) Place 1 drop into both eyes daily as needed (dry eyes).   Marland Kitchen loratadine (CLARITIN) 10 MG tablet Take 1 tablet (10 mg total) by mouth daily.  Marland Kitchen losartan (COZAAR) 100 MG tablet Take 1 tablet (100 mg total) by mouth daily.  . meclizine (ANTIVERT) 25 MG tablet Take 25 mg by mouth every 6 (six) hours as needed.  . metFORMIN (GLUCOPHAGE) 500 MG tablet Take 2 tablets (1,000 mg total) by mouth 2 (two) times daily with a meal.  . montelukast (SINGULAIR) 10 MG tablet Take 1 tablet (10 mg total) by mouth at bedtime. (Patient not taking: Reported on 06/19/2020)  . rosuvastatin (CRESTOR) 40 MG tablet Take 1 tablet (40 mg total) by mouth daily.  . Vitamin D, Ergocalciferol, (DRISDOL) 1.25 MG (50000 UNIT) CAPS capsule Take 1 capsule (50,000 Units total) by mouth every 7 (seven) days.   No facility-administered encounter medications on file as of 07/04/2020.    Functional Status:  In your present state of health, do you have any difficulty performing the following activities: 07/04/2020 09/23/2019  Hearing? N N  Vision? N N  Difficulty concentrating or making decisions? N N  Walking or climbing stairs? Y N  Comment knee problems -  Dressing or bathing? N N  Doing errands, shopping? N N  Conservation officer, nature and  eating ? N N  Using the Toilet? N N  In the past six months, have you accidently leaked urine? Y N  Comment some incontinence at times -  Do you have problems with loss of bowel control? N N  Managing your Medications? N N  Managing your Finances? N N  Housekeeping or managing your Housekeeping? N N  Some recent data might be hidden    Fall/Depression Screening: Fall Risk  07/04/2020 10/18/2019 09/23/2019  Falls in the past year? 0 0 0  Number falls in past yr: 0 - 0  Injury with Fall? 0 - 0   Comment - - -  Risk for fall due to : - - -  Risk for fall due to: Comment - - -  Follow up - - Education provided;Falls prevention discussed   PHQ 2/9 Scores 07/04/2020 10/18/2019 09/23/2019 09/06/2019 04/02/2019 09/18/2018 08/13/2018  PHQ - 2 Score 0 0 0 2 0 0 0  PHQ- 9 Score - - - 9 - - 2  Exception Documentation - - - - - - -    Assessment: Patient not managing diabetes as well as previously due to other health issues.  Patient determined to return back to better management of diabetes.  Goals Addressed            This Visit's Progress   . THN-Make and Keep All Appointments       Timeframe:  Long-Range Goal Priority:  High Start Date:    07/04/20                         Expected End Date:    10/21/20                   Follow Up Date 10/21/20  - arrange a ride through an agency 1 week before appointment - keep a calendar with appointment dates    Why is this important?    Part of staying healthy is seeing the doctor for follow-up care.   If you forget your appointments, there are some things you can do to stay on track.    Notes:     . THN-Monitor and Manage My Blood Sugar-Diabetes Type 2       Timeframe:  Long-Range Goal Priority:  High Start Date:   07/04/20                          Expected End Date:  10/21/20                      Follow Up Date 10/21/20   - check blood sugar at prescribed times - take the blood sugar log to all doctor visits    Why is this important?    Checking your blood sugar at home helps to keep it from getting very high or very low.   Writing the results in a diary or log helps the doctor know how to care for you.   Your blood sugar log should have the time, date and the results.   Also, write down the amount of insulin or other medicine that you take.   Other information, like what you ate, exercise done and how you were feeling, will also be helpful.     Notes:        Plan: RN CM will refer to health coach for follow up. Follow-up:   Patient agrees to  Care Plan and Follow-up.   Jone Baseman, RN, MSN [place] Management Care Management Coordinator Direct Line [phone] Cell [phone] Toll Free: [phone]  Fax: [phone]

## 2020-07-04 NOTE — Patient Instructions (Signed)
Goals Addressed            This Visit's Progress   . THN-Make and Keep All Appointments       Timeframe:  Long-Range Goal Priority:  High Start Date:    07/04/20                         Expected End Date:    10/21/20                   Follow Up Date 10/21/20  - arrange a ride through an agency 1 week before appointment - keep a calendar with appointment dates    Why is this important?    Part of staying healthy is seeing the doctor for follow-up care.   If you forget your appointments, there are some things you can do to stay on track.    Notes:     . THN-Monitor and Manage My Blood Sugar-Diabetes Type 2       Timeframe:  Long-Range Goal Priority:  High Start Date:   07/04/20                          Expected End Date:  10/21/20                      Follow Up Date 10/21/20   - check blood sugar at prescribed times - take the blood sugar log to all doctor visits    Why is this important?    Checking your blood sugar at home helps to keep it from getting very high or very low.   Writing the results in a diary or log helps the doctor know how to care for you.   Your blood sugar log should have the time, date and the results.   Also, write down the amount of insulin or other medicine that you take.   Other information, like what you ate, exercise done and how you were feeling, will also be helpful.     Notes:

## 2020-07-05 ENCOUNTER — Other Ambulatory Visit: Payer: Self-pay | Admitting: *Deleted

## 2020-07-05 DIAGNOSIS — E079 Disorder of thyroid, unspecified: Secondary | ICD-10-CM | POA: Diagnosis not present

## 2020-07-05 DIAGNOSIS — J310 Chronic rhinitis: Secondary | ICD-10-CM | POA: Diagnosis not present

## 2020-07-07 DIAGNOSIS — R061 Stridor: Secondary | ICD-10-CM | POA: Diagnosis not present

## 2020-07-07 DIAGNOSIS — E079 Disorder of thyroid, unspecified: Secondary | ICD-10-CM | POA: Diagnosis not present

## 2020-07-07 DIAGNOSIS — E041 Nontoxic single thyroid nodule: Secondary | ICD-10-CM | POA: Diagnosis not present

## 2020-07-12 ENCOUNTER — Other Ambulatory Visit (HOSPITAL_COMMUNITY): Payer: Medicare Other

## 2020-07-17 ENCOUNTER — Encounter: Payer: Self-pay | Admitting: Family Medicine

## 2020-07-17 ENCOUNTER — Ambulatory Visit (INDEPENDENT_AMBULATORY_CARE_PROVIDER_SITE_OTHER): Payer: Medicare Other | Admitting: Family Medicine

## 2020-07-17 ENCOUNTER — Other Ambulatory Visit: Payer: Self-pay

## 2020-07-17 DIAGNOSIS — E1165 Type 2 diabetes mellitus with hyperglycemia: Secondary | ICD-10-CM | POA: Diagnosis not present

## 2020-07-17 DIAGNOSIS — E7800 Pure hypercholesterolemia, unspecified: Secondary | ICD-10-CM | POA: Diagnosis not present

## 2020-07-17 DIAGNOSIS — E559 Vitamin D deficiency, unspecified: Secondary | ICD-10-CM

## 2020-07-17 DIAGNOSIS — I10 Essential (primary) hypertension: Secondary | ICD-10-CM | POA: Diagnosis not present

## 2020-07-17 DIAGNOSIS — F32A Depression, unspecified: Secondary | ICD-10-CM | POA: Diagnosis not present

## 2020-07-17 DIAGNOSIS — F419 Anxiety disorder, unspecified: Secondary | ICD-10-CM

## 2020-07-17 MED ORDER — BUSPIRONE HCL 7.5 MG PO TABS: 7.5000 mg | ORAL_TABLET | Freq: Three times a day (TID) | ORAL | 3 refills | Status: DC

## 2020-07-17 NOTE — Assessment & Plan Note (Signed)
Supplement and monitor 

## 2020-07-17 NOTE — Assessment & Plan Note (Signed)
hgba1c is up. She was asked to double her Metformin to 1000 mg bid but she was worried so she did not do it yet. She agrees to do it now. Repeat cmp in 4-8 weeks, minimize simple carbs. Increase exercise as tolerated. Continue current meds.

## 2020-07-17 NOTE — Patient Instructions (Signed)
http://APA.org/depression-guideline"> https://clinicalkey.com"> http://point-of-care.elsevierperformancemanager.com/skills/"> http://point-of-care.elsevierperformancemanager.com">  Managing Depression, Adult Depression is a mental health condition that affects your thoughts, feelings, and actions. Being diagnosed with depression can bring you relief if you did not know why you have felt or behaved a certain way. It could also leave you feeling overwhelmed with uncertainty about your future. Preparing yourself to manage your symptoms can help you feel more positive about your future. How to manage lifestyle changes Managing stress Stress is your body's reaction to life changes and events, both good and bad. Stress can add to your feelings of depression. Learning to manage your stress can help lessen your feelings of depression. Try some of the following approaches to reducing your stress (stress reduction techniques):  Listen to music that you enjoy and that inspires you.  Try using a meditation app or take a meditation class.  Develop a practice that helps you connect with your spiritual self. Walk in nature, pray, or go to a place of worship.  Do some deep breathing. To do this, inhale slowly through your nose. Pause at the top of your inhale for a few seconds and then exhale slowly, letting your muscles relax.  Practice yoga to help relax and work your muscles. Choose a stress reduction technique that suits your lifestyle and personality. These techniques take time and practice to develop. Set aside 5-15 minutes a day to do them. Therapists can offer training in these techniques. Other things you can do to manage stress include:  Keeping a stress diary.  Knowing your limits and saying no when you think something is too much.  Paying attention to how you react to certain situations. You may not be able to control everything, but you can change your reaction.  Adding humor to your life by  watching funny films or TV shows.  Making time for activities that you enjoy and that relax you.   Medicines Medicines, such as antidepressants, are often a part of treatment for depression.  Talk with your pharmacist or health care provider about all the medicines, supplements, and herbal products that you take, their possible side effects, and what medicines and other products are safe to take together.  Make sure to report any side effects you may have to your health care provider. Relationships Your health care provider may suggest family therapy, couples therapy, or individual therapy as part of your treatment. How to recognize changes Everyone responds differently to treatment for depression. As you recover from depression, you may start to:  Have more interest in doing activities.  Feel less hopeless.  Have more energy.  Overeat less often, or have a better appetite.  Have better mental focus. It is important to recognize if your depression is not getting better or is getting worse. The symptoms you had in the beginning may return, such as:  Tiredness (fatigue) or low energy.  Eating too much or too little.  Sleeping too much or too little.  Feeling restless, agitated, or hopeless.  Trouble focusing or making decisions.  Unexplained physical complaints.  Feeling irritable, angry, or aggressive. If you or your family members notice these symptoms coming back, let your health care provider know right away. Follow these instructions at home: Activity  Try to get some form of exercise each day, such as walking, biking, swimming, or lifting weights.  Practice stress reduction techniques.  Engage your mind by taking a class or doing some volunteer work.   Lifestyle  Get the right amount and quality of sleep.    Cut down on using caffeine, tobacco, alcohol, and other potentially harmful substances.  Eat a healthy diet that includes plenty of vegetables, fruits,  whole grains, low-fat dairy products, and lean protein. Do not eat a lot of foods that are high in solid fats, added sugars, or salt (sodium). General instructions  Take over-the-counter and prescription medicines only as told by your health care provider.  Keep all follow-up visits as told by your health care provider. This is important. Where to find support Talking to others Friends and family members can be sources of support and guidance. Talk to trusted friends or family members about your condition. Explain your symptoms to them, and let them know that you are working with a health care provider to treat your depression. Tell friends and family members how they also can be helpful.   Finances  Find appropriate mental health providers that fit with your financial situation.  Talk with your health care provider about options to get reduced prices on your medicines. Where to find more information You can find support in your area from:  Anxiety and Depression Association of America (ADAA): www.adaa.org  Mental Health America: www.mentalhealthamerica.net  National Alliance on Mental Illness: www.nami.org Contact a health care provider if:  You stop taking your antidepressant medicines, and you have any of these symptoms: ? Nausea. ? Headache. ? Light-headedness. ? Chills and body aches. ? Not being able to sleep (insomnia).  You or your friends and family think your depression is getting worse. Get help right away if:  You have thoughts of hurting yourself or others. If you ever feel like you may hurt yourself or others, or have thoughts about taking your own life, get help right away. Go to your nearest emergency department or:  Call your local emergency services (911 in the [place]  Call a suicide crisis helpline, such as the National Suicide Prevention Lifeline at 1-800-273-8255. This is open 24 hours a day in the [place]  Text the Crisis Text Line at 741741 (in the  [place] Summary  If you are diagnosed with depression, preparing yourself to manage your symptoms is a good way to feel positive about your future.  Work with your health care provider on a management plan that includes stress reduction techniques, medicines (if applicable), therapy, and healthy lifestyle habits.  Keep talking with your health care provider about how your treatment is working.  If you have thoughts about taking your own life, call a suicide crisis helpline or text a crisis text line. This information is not intended to replace advice given to you by your health care provider. Make sure you discuss any questions you have with your health care provider. Document Revised: 04/21/2019 Document Reviewed: 04/21/2019 Elsevier Patient Education  2021 Elsevier Inc.  

## 2020-07-19 DIAGNOSIS — I10 Essential (primary) hypertension: Secondary | ICD-10-CM | POA: Diagnosis not present

## 2020-07-19 DIAGNOSIS — E079 Disorder of thyroid, unspecified: Secondary | ICD-10-CM | POA: Diagnosis not present

## 2020-07-19 DIAGNOSIS — E785 Hyperlipidemia, unspecified: Secondary | ICD-10-CM | POA: Diagnosis not present

## 2020-07-19 DIAGNOSIS — E119 Type 2 diabetes mellitus without complications: Secondary | ICD-10-CM | POA: Diagnosis not present

## 2020-07-19 DIAGNOSIS — K219 Gastro-esophageal reflux disease without esophagitis: Secondary | ICD-10-CM | POA: Diagnosis not present

## 2020-07-19 DIAGNOSIS — F32A Depression, unspecified: Secondary | ICD-10-CM | POA: Diagnosis not present

## 2020-07-19 NOTE — Assessment & Plan Note (Signed)
She is very stressed as she awaits her surgery for her symptomatic goiter. She is having surgery with Dr Bradd Burner at [facility] on 07/24/2020, will try adding Buspar 7.5 mg tabs tid prn

## 2020-07-19 NOTE — Assessment & Plan Note (Addendum)
minimize simple carbs. Increase exercise as tolerated. Continue current meds  

## 2020-07-19 NOTE — Assessment & Plan Note (Signed)
Well controlled, no changes to meds. Encouraged heart healthy diet such as the DASH diet and exercise as tolerated.  °

## 2020-07-19 NOTE — Assessment & Plan Note (Signed)
Tolerating statin, encouraged heart healthy diet, avoid trans fats, minimize simple carbs and saturated fats. Increase exercise as tolerated 

## 2020-07-19 NOTE — Progress Notes (Signed)
Subjective:    [name], female    DOB: September 15, 1950, 70 y.o.   MRN: [id number]  Chief Complaint  Patient presents with  . Follow-up    Pt would like to discuss medication and surgery.    HPI Patient is in today for follow up on chronic medical concerns. No recent febrile illness or hospitalizations. She has surgery scheduled on 07/24/2020 for her symptomatic goiter with Dr Bradd Burner. She struggles with dysphagia and sob. She if very anxious and struggling with anhedonia but no suicidal ideation. Denies CP/palp/HA/congestion/fevers/GI or GU c/o. Taking meds as prescribed  Past Medical History:  Diagnosis Date  . Anemia 04/05/2012  . Anxiety   . Anxiety state 09/11/2007   Qualifier: Diagnosis of  By: Scherrie Gerlach    . Asthma   . Atrial tachycardia, paroxysmal ([place])   . Back pain 10/02/2014  . Breast cancer ([place]) 02/17/2017  . Chicken pox as a child  . Chronic diastolic CHF (congestive heart failure), NYHA class 1 ([place])   . Complication of anesthesia    pt states feels different in her body after anesthesia when waking up and also experiences a smell of burnt plastic for approx a wk   . Constipation   . Depression   . Diabetes ([place])   . Dilated aortic root ([place])    53m by echo 06/2019  . Dysuria 02/17/2017  . Elevated LFTs 04/05/2012  . GERD (gastroesophageal reflux disease)   . Goiter   . Heart murmur    hx of one at birth   . History of kidney stones   . History of radiation therapy 10/31/16-12/17/16   left breast 45 Gy in 25 fractions, left breast boost 16 Gy in 8 fractions  . Hx of colonic polyps   . Hyperlipidemia   . Hypertension   . Insomnia 10/08/2016  . Joint pain   . Malignant neoplasm of upper-outer quadrant of left female breast ([facility] 05/14/2016   not with patient  . Measles as a child  . Mumps as a child  . OA (osteoarthritis) of knee 04/05/2012  . Obesity 07/11/2014  . PONV (postoperative nausea and vomiting)   . Preventative health care  09/05/2013  . PVC's (premature ventricular contractions) 05/02/2016  . Shortness of breath dyspnea    walking distances / climbing stairs  . Sleep apnea 04/05/2012  . Tinea corporis 02/23/2013  . Type 2 diabetes mellitus with hyperglycemia ([place] 12/31/2013  . Vasomotor rhinitis 04/05/2012  . Ventral hernia   . Wheezing     Past Surgical History:  Procedure Laterality Date  . BREAST LUMPECTOMY Left 2017  . BREAST LUMPECTOMY WITH RADIOACTIVE SEED AND SENTINEL LYMPH NODE BIOPSY Left 06/21/2016   Procedure: LEFT BREAST LUMPECTOMY WITH RADIOACTIVE SEED AND SENTINEL LYMPH NODE BIOPSY, INJECT BLUE DYE LEFT BREAST;  Surgeon: HFanny Skates MD;  [facility]  Service: General;  Laterality: Left;  . CARDIAC CATHETERIZATION     normal coroary arteries per patient  . CARDIOVASCULAR STRESS TEST     10/12/2013  . CESAREAN SECTION     X 3  . CHOLECYSTECTOMY    . COLONOSCOPY WITH PROPOFOL N/A 03/28/2015   Procedure: COLONOSCOPY WITH PROPOFOL;  Surgeon: JJuanita Craver MD;  Location: [facility];  Service: Endoscopy;  Laterality: N/A;  . HERNIA REPAIR  02/08/11   ventral hernia  . JOINT REPLACEMENT     bilateral  . KNEE ARTHROSCOPY  05/2010   bilateral  . LEFT AND RIGHT HEART  CATHETERIZATION WITH CORONARY ANGIOGRAM N/A 11/08/2013   Procedure: LEFT AND RIGHT HEART CATHETERIZATION WITH CORONARY ANGIOGRAM;  Surgeon: [name], MD;  Location: [facility] CATH LAB;  Service: Cardiovascular;  Laterality: N/A;  . MENISCUS REPAIR  2009  . MOUTH SURGERY     teeth implants  . PORT-A-CATH REMOVAL N/A 01/24/2017   Procedure: REMOVAL PORT-A-CATH;  Surgeon: Fanny Skates, MD;  Location: [place];  Service: General;  Laterality: N/A;  . PORTACATH PLACEMENT Right 07/16/2016   Procedure: INSERTION PORT-A-CATH RIGHT INTERNAL JUGULAR WITH ULTRASOUND;  Surgeon: Fanny Skates, MD;  Location: [facility];  Service: General;  Laterality: Right;  . REPAIR DURAL / CSF LEAK  2021   performed at [facility] in [place]   . TOTAL KNEE ARTHROPLASTY  2011   left  . TOTAL KNEE ARTHROPLASTY Right 05/10/2014   Procedure: RIGHT TOTAL KNEE ARTHROPLASTY;  Surgeon: Mauri Pole, MD;  Location: [facility];  Service: Orthopedics;  Laterality: Right;  . TUBAL LIGATION    . WISDOM TOOTH EXTRACTION  2000    Family History  Problem Relation Age of Onset  . Thyroid disease Mother   . Hyperlipidemia Mother   . Depression Mother   . Anxiety disorder Mother   . Heart attack Father 77  . Hypertension Father   . Arthritis Father        RA  . Coronary artery disease Father   . High Cholesterol Father   . Coronary artery disease Brother   . Heart disease Brother   . Cancer Maternal Aunt        colon  . Cancer Maternal Grandmother        colon  . Heart attack Maternal Grandfather   . Alcohol abuse Paternal Grandfather     Social History   Socioeconomic History  . Marital status: Divorced    Spouse name: Not on file  . Number of children: 3  . Years of education: Not on file  . Highest education level: Not on file  Occupational History  . Occupation: retired - [employer]  Tobacco Use  . Smoking status: Never Smoker  . Smokeless tobacco: Never Used  Vaping Use  . Vaping Use: Never used  Substance and Sexual Activity  . Alcohol use: Not Currently    Comment: rarely  . Drug use: No  . Sexual activity: Never    Comment: lives with mother currently-stress  Other Topics Concern  . Not on file  Social History Narrative  . Not on file   Social Determinants of Health   Financial Resource Strain: Low Risk   . Difficulty of Paying Living Expenses: Not hard at all  Food Insecurity: No Food Insecurity  . Worried About Charity fundraiser in the Last Year: Never true  . Ran Out of Food in the Last Year: Never true  Transportation Needs: No Transportation Needs  . Lack of Transportation (Medical): No  . Lack of Transportation (Non-Medical): No  Physical Activity: Not on file  Stress: No Stress Concern  Present  . Feeling of Stress : Not at all  Social Connections: Not on file  Intimate Partner Violence: Not on file    Outpatient Medications Prior to Visit  Medication Sig Dispense Refill  . acetaminophen (TYLENOL) 500 MG tablet Take 1,000 mg by mouth 2 (two) times daily as needed for moderate pain or headache.    . albuterol (VENTOLIN HFA) 108 (90 Base) MCG/ACT inhaler Inhale 2 puffs every 4-6 hours as needed -  rescue. 18 g 12  . anastrozole (ARIMIDEX) 1 MG tablet Take 1 tablet (1 mg total) by mouth daily. 90 tablet 3  . carvedilol (COREG) 12.5 MG tablet Take 1 tablet (12.5 mg total) by mouth 2 (two) times daily with a meal. 60 tablet 2  . diclofenac (VOLTAREN) 75 MG EC tablet Take 1 tablet (75 mg total) by mouth 2 (two) times daily. 180 tablet 0  . diltiazem (CARDIZEM CD) 120 MG 24 hr capsule Take 1 capsule (120 mg total) by mouth daily. 90 capsule 3  . Docusate Calcium (STOOL SOFTENER PO) Take by mouth daily.    Marland Kitchen escitalopram (LEXAPRO) 20 MG tablet TAKE 1 TABLET EVERY MORNING AND 1/2 EVERY EVENING 135 tablet 1  . esomeprazole (NEXIUM) 40 MG capsule Take 1 capsule (40 mg total) by mouth daily. 90 capsule 3  . ezetimibe (ZETIA) 10 MG tablet Take 1 tablet (10 mg total) by mouth daily. 90 tablet 3  . fluticasone (FLONASE) 50 MCG/ACT nasal spray Place 2 sprays into both nostrils daily. 16 g 6  . fluticasone furoate-vilanterol (BREO ELLIPTA) 100-25 MCG/INH AEPB Inhale 1 puff then rinse mouth, once daily 60 each 12  . furosemide (LASIX) 20 MG tablet Take 1 tablet (20 mg total) by mouth daily. 90 tablet 3  . glucose blood (ACCU-CHEK AVIVA) test strip Twice daily (please use brand that patient insurance covers) 100 each 5  . Hypromellose (ARTIFICIAL TEARS OP) Place 1 drop into both eyes daily as needed (dry eyes).     Marland Kitchen loratadine (CLARITIN) 10 MG tablet Take 1 tablet (10 mg total) by mouth daily. 30 tablet 11  . losartan (COZAAR) 100 MG tablet Take 1 tablet (100 mg total) by mouth daily. 90  tablet 3  . meclizine (ANTIVERT) 25 MG tablet Take 25 mg by mouth every 6 (six) hours as needed.    . metFORMIN (GLUCOPHAGE) 500 MG tablet Take 2 tablets (1,000 mg total) by mouth 2 (two) times daily with a meal. 360 tablet 0  . montelukast (SINGULAIR) 10 MG tablet Take 1 tablet (10 mg total) by mouth at bedtime. 90 tablet 3  . rosuvastatin (CRESTOR) 40 MG tablet Take 1 tablet (40 mg total) by mouth daily. 90 tablet 3  . Vitamin D, Ergocalciferol, (DRISDOL) 1.25 MG (50000 UNIT) CAPS capsule Take 1 capsule (50,000 Units total) by mouth every 7 (seven) days. 4 capsule 4   No facility-administered medications prior to visit.    Allergies  Allergen Reactions  . No Known Allergies     Review of Systems  Constitutional: Negative for fever and malaise/fatigue.  HENT: Negative for congestion.   Eyes: Negative for blurred vision.  Respiratory: Positive for shortness of breath.   Cardiovascular: Negative for chest pain, palpitations and leg swelling.  Gastrointestinal: Negative for abdominal pain, blood in stool and nausea.  Genitourinary: Negative for dysuria and frequency.  Musculoskeletal: Negative for falls.  Skin: Negative for rash.  Neurological: Negative for dizziness, loss of consciousness and headaches.  Endo/Heme/Allergies: Negative for environmental allergies.  Psychiatric/Behavioral: Positive for depression. The patient is nervous/anxious.        Objective:    Physical Exam Vitals and nursing note reviewed.  Constitutional:      General: She is not in acute distress.    Appearance: She is well-developed and well-nourished.  HENT:     Head: Normocephalic and atraumatic.     Nose: Nose normal.  Eyes:     General:        Right  eye: No discharge.        Left eye: No discharge.  Cardiovascular:     Rate and Rhythm: Normal rate and regular rhythm.     Heart sounds: No murmur heard.   Pulmonary:     Effort: Pulmonary effort is normal.     Breath sounds: Normal breath  sounds.  Abdominal:     General: Bowel sounds are normal.     Palpations: Abdomen is soft.     Tenderness: There is no abdominal tenderness.  Musculoskeletal:        General: No edema.     Cervical back: Normal range of motion and neck supple.  Skin:    General: Skin is warm and dry.  Neurological:     Mental Status: She is alert and oriented to person, place, and time.  Psychiatric:        Mood and Affect: Mood and affect normal.     BP 124/76   Pulse 88   Temp 98.6 F (37 C) (Oral)   Resp 18   Wt (!) 311 lb 9.6 oz (141.3 kg)   SpO2 95%   BMI 50.29 kg/m  Wt Readings from Last 3 Encounters:  07/17/20 (!) 311 lb 9.6 oz (141.3 kg)  06/19/20 (!) 312 lb 6.4 oz (141.7 kg)  06/02/20 (!) 308 lb (139.7 kg)    Diabetic Foot Exam - Simple   No data filed    Lab Results  Component Value Date   WBC 7.2 06/19/2020   HGB 12.0 06/19/2020   HCT 38.3 06/19/2020   PLT 255.0 06/19/2020   GLUCOSE 190 (H) 06/02/2020   CHOL 141 06/02/2020   TRIG 246 (H) 06/02/2020   HDL 40 (L) 06/02/2020   LDLDIRECT 108.0 10/08/2016   LDLCALC 67 06/02/2020   ALT 32 (H) 06/02/2020   AST 40 (H) 06/02/2020   NA 139 06/02/2020   K 4.5 06/02/2020   CL 100 06/02/2020   CREATININE 0.74 06/02/2020   BUN 13 06/02/2020   CO2 28 06/02/2020   TSH 1.84 09/06/2019   INR 1.01 05/03/2014   HGBA1C 9.5 (H) 06/02/2020    Lab Results  Component Value Date   TSH 1.84 09/06/2019   Lab Results  Component Value Date   WBC 7.2 06/19/2020   HGB 12.0 06/19/2020   HCT 38.3 06/19/2020   MCV 78.8 06/19/2020   PLT 255.0 06/19/2020   Lab Results  Component Value Date   NA 139 06/02/2020   K 4.5 06/02/2020   CHLORIDE 107 04/11/2017   CO2 28 06/02/2020   GLUCOSE 190 (H) 06/02/2020   BUN 13 06/02/2020   CREATININE 0.74 06/02/2020   BILITOT 0.6 06/02/2020   ALKPHOS 103 09/06/2019   AST 40 (H) 06/02/2020   ALT 32 (H) 06/02/2020   PROT 7.3 06/02/2020   ALBUMIN 4.1 09/06/2019   CALCIUM 9.8 06/02/2020    ANIONGAP 10 10/06/2017   EGFR >60 04/11/2017   GFR 63.75 09/06/2019   Lab Results  Component Value Date   CHOL 141 06/02/2020   Lab Results  Component Value Date   HDL 40 (L) 06/02/2020   Lab Results  Component Value Date   LDLCALC 67 06/02/2020   Lab Results  Component Value Date   TRIG 246 (H) 06/02/2020   Lab Results  Component Value Date   CHOLHDL 3.5 06/02/2020   Lab Results  Component Value Date   HGBA1C 9.5 (H) 06/02/2020       Assessment & Plan:  Problem List Items Addressed This Visit    Hyperlipidemia    Tolerating statin, encouraged heart healthy diet, avoid trans fats, minimize simple carbs and saturated fats. Increase exercise as tolerated      Anxiety and depression    She is very stressed as she awaits her surgery for her symptomatic goiter. She is having surgery with Dr Bradd Burner at [facility] on 07/24/2020, will try adding Buspar 7.5 mg tabs tid prn      Relevant Medications   busPIRone (BUSPAR) 7.5 MG tablet   Essential hypertension    Well controlled, no changes to meds. Encouraged heart healthy diet such as the DASH diet and exercise as tolerated.       Type 2 diabetes mellitus with hyperglycemia (H[facility])    minimize simple carbs. Increase exercise as tolerated. Continue current meds      Vitamin D deficiency    Supplement and monitor      Uncontrolled type 2 diabetes mellitus with hyperglycemia ([place])    hgba1c is up. She was asked to double her Metformin to 1000 mg bid but she was worried so she did not do it yet. She agrees to do it now. Repeat cmp in 4-8 weeks, minimize simple carbs. Increase exercise as tolerated. Continue current meds.      Relevant Orders   Comprehensive metabolic panel      I am having Syndi A. Ledonne start on busPIRone. I am also having her maintain her Hypromellose (ARTIFICIAL TEARS OP), acetaminophen, loratadine, fluticasone, glucose blood, diltiazem, esomeprazole, ezetimibe, furosemide, losartan, montelukast,  rosuvastatin, Vitamin D (Ergocalciferol), meclizine, Docusate Calcium (STOOL SOFTENER PO), anastrozole, carvedilol, diclofenac, metFORMIN, escitalopram, Breo Ellipta, and albuterol.  Meds ordered this encounter  Medications  . busPIRone (BUSPAR) 7.5 MG tablet    Sig: Take 1 tablet (7.5 mg total) by mouth 3 (three) times daily.    Dispense:  90 tablet    Refill:  3     Penni Homans, MD

## 2020-07-22 DIAGNOSIS — Z96651 Presence of right artificial knee joint: Secondary | ICD-10-CM | POA: Diagnosis not present

## 2020-07-22 DIAGNOSIS — G4733 Obstructive sleep apnea (adult) (pediatric): Secondary | ICD-10-CM | POA: Diagnosis not present

## 2020-07-24 DIAGNOSIS — Z7982 Long term (current) use of aspirin: Secondary | ICD-10-CM | POA: Diagnosis not present

## 2020-07-24 DIAGNOSIS — E079 Disorder of thyroid, unspecified: Secondary | ICD-10-CM | POA: Diagnosis not present

## 2020-07-24 DIAGNOSIS — I110 Hypertensive heart disease with heart failure: Secondary | ICD-10-CM | POA: Diagnosis not present

## 2020-07-24 DIAGNOSIS — E049 Nontoxic goiter, unspecified: Secondary | ICD-10-CM | POA: Diagnosis not present

## 2020-07-24 DIAGNOSIS — R061 Stridor: Secondary | ICD-10-CM | POA: Diagnosis not present

## 2020-07-24 DIAGNOSIS — Z79811 Long term (current) use of aromatase inhibitors: Secondary | ICD-10-CM | POA: Diagnosis not present

## 2020-07-24 DIAGNOSIS — K219 Gastro-esophageal reflux disease without esophagitis: Secondary | ICD-10-CM | POA: Diagnosis not present

## 2020-07-24 DIAGNOSIS — F32A Depression, unspecified: Secondary | ICD-10-CM | POA: Diagnosis not present

## 2020-07-24 DIAGNOSIS — Z794 Long term (current) use of insulin: Secondary | ICD-10-CM | POA: Diagnosis not present

## 2020-07-24 DIAGNOSIS — E041 Nontoxic single thyroid nodule: Secondary | ICD-10-CM | POA: Diagnosis not present

## 2020-07-24 DIAGNOSIS — E785 Hyperlipidemia, unspecified: Secondary | ICD-10-CM | POA: Diagnosis not present

## 2020-07-24 DIAGNOSIS — I509 Heart failure, unspecified: Secondary | ICD-10-CM | POA: Diagnosis not present

## 2020-07-24 DIAGNOSIS — Z79899 Other long term (current) drug therapy: Secondary | ICD-10-CM | POA: Diagnosis not present

## 2020-07-24 DIAGNOSIS — E042 Nontoxic multinodular goiter: Secondary | ICD-10-CM | POA: Diagnosis not present

## 2020-07-24 DIAGNOSIS — E119 Type 2 diabetes mellitus without complications: Secondary | ICD-10-CM | POA: Diagnosis not present

## 2020-07-24 DIAGNOSIS — Z7984 Long term (current) use of oral hypoglycemic drugs: Secondary | ICD-10-CM | POA: Diagnosis not present

## 2020-07-24 DIAGNOSIS — C73 Malignant neoplasm of thyroid gland: Secondary | ICD-10-CM | POA: Diagnosis not present

## 2020-07-24 DIAGNOSIS — R070 Pain in throat: Secondary | ICD-10-CM | POA: Diagnosis not present

## 2020-07-25 ENCOUNTER — Other Ambulatory Visit: Payer: Self-pay | Admitting: Family Medicine

## 2020-07-25 ENCOUNTER — Encounter: Payer: Self-pay | Admitting: Family Medicine

## 2020-07-25 DIAGNOSIS — Z79899 Other long term (current) drug therapy: Secondary | ICD-10-CM | POA: Diagnosis not present

## 2020-07-25 DIAGNOSIS — F32A Depression, unspecified: Secondary | ICD-10-CM | POA: Diagnosis not present

## 2020-07-25 DIAGNOSIS — I509 Heart failure, unspecified: Secondary | ICD-10-CM | POA: Diagnosis not present

## 2020-07-25 DIAGNOSIS — E1165 Type 2 diabetes mellitus with hyperglycemia: Secondary | ICD-10-CM | POA: Diagnosis not present

## 2020-07-25 DIAGNOSIS — E785 Hyperlipidemia, unspecified: Secondary | ICD-10-CM | POA: Diagnosis not present

## 2020-07-25 DIAGNOSIS — R638 Other symptoms and signs concerning food and fluid intake: Secondary | ICD-10-CM | POA: Diagnosis not present

## 2020-07-25 DIAGNOSIS — E119 Type 2 diabetes mellitus without complications: Secondary | ICD-10-CM | POA: Diagnosis not present

## 2020-07-25 DIAGNOSIS — I110 Hypertensive heart disease with heart failure: Secondary | ICD-10-CM | POA: Diagnosis not present

## 2020-07-25 DIAGNOSIS — C73 Malignant neoplasm of thyroid gland: Secondary | ICD-10-CM | POA: Diagnosis not present

## 2020-07-25 DIAGNOSIS — Z7982 Long term (current) use of aspirin: Secondary | ICD-10-CM | POA: Diagnosis not present

## 2020-07-25 DIAGNOSIS — R070 Pain in throat: Secondary | ICD-10-CM | POA: Diagnosis not present

## 2020-07-25 DIAGNOSIS — Z7984 Long term (current) use of oral hypoglycemic drugs: Secondary | ICD-10-CM | POA: Diagnosis not present

## 2020-07-25 DIAGNOSIS — R061 Stridor: Secondary | ICD-10-CM | POA: Diagnosis not present

## 2020-07-25 DIAGNOSIS — E079 Disorder of thyroid, unspecified: Secondary | ICD-10-CM | POA: Diagnosis not present

## 2020-07-25 DIAGNOSIS — E041 Nontoxic single thyroid nodule: Secondary | ICD-10-CM | POA: Diagnosis not present

## 2020-07-25 DIAGNOSIS — Z794 Long term (current) use of insulin: Secondary | ICD-10-CM | POA: Diagnosis not present

## 2020-07-25 DIAGNOSIS — K219 Gastro-esophageal reflux disease without esophagitis: Secondary | ICD-10-CM | POA: Diagnosis not present

## 2020-07-25 DIAGNOSIS — Z79811 Long term (current) use of aromatase inhibitors: Secondary | ICD-10-CM | POA: Diagnosis not present

## 2020-07-25 MED ORDER — VICTOZA 18 MG/3ML ~~LOC~~ SOPN: 1.8000 mg | PEN_INJECTOR | Freq: Every day | SUBCUTANEOUS | 1 refills | Status: DC

## 2020-07-25 MED ORDER — VICTOZA 18 MG/3ML ~~LOC~~ SOPN: PEN_INJECTOR | SUBCUTANEOUS | 0 refills | Status: DC

## 2020-07-26 DIAGNOSIS — Z7984 Long term (current) use of oral hypoglycemic drugs: Secondary | ICD-10-CM | POA: Diagnosis not present

## 2020-07-26 DIAGNOSIS — I110 Hypertensive heart disease with heart failure: Secondary | ICD-10-CM | POA: Diagnosis not present

## 2020-07-26 DIAGNOSIS — Z794 Long term (current) use of insulin: Secondary | ICD-10-CM | POA: Diagnosis not present

## 2020-07-26 DIAGNOSIS — R638 Other symptoms and signs concerning food and fluid intake: Secondary | ICD-10-CM | POA: Diagnosis not present

## 2020-07-26 DIAGNOSIS — R070 Pain in throat: Secondary | ICD-10-CM | POA: Diagnosis not present

## 2020-07-26 DIAGNOSIS — R061 Stridor: Secondary | ICD-10-CM | POA: Diagnosis not present

## 2020-07-26 DIAGNOSIS — Z79811 Long term (current) use of aromatase inhibitors: Secondary | ICD-10-CM | POA: Diagnosis not present

## 2020-07-26 DIAGNOSIS — E785 Hyperlipidemia, unspecified: Secondary | ICD-10-CM | POA: Diagnosis not present

## 2020-07-26 DIAGNOSIS — Z79899 Other long term (current) drug therapy: Secondary | ICD-10-CM | POA: Diagnosis not present

## 2020-07-26 DIAGNOSIS — K219 Gastro-esophageal reflux disease without esophagitis: Secondary | ICD-10-CM | POA: Diagnosis not present

## 2020-07-26 DIAGNOSIS — C73 Malignant neoplasm of thyroid gland: Secondary | ICD-10-CM | POA: Diagnosis not present

## 2020-07-26 DIAGNOSIS — Z7982 Long term (current) use of aspirin: Secondary | ICD-10-CM | POA: Diagnosis not present

## 2020-07-26 DIAGNOSIS — I509 Heart failure, unspecified: Secondary | ICD-10-CM | POA: Diagnosis not present

## 2020-07-26 DIAGNOSIS — E079 Disorder of thyroid, unspecified: Secondary | ICD-10-CM | POA: Diagnosis not present

## 2020-07-26 DIAGNOSIS — E041 Nontoxic single thyroid nodule: Secondary | ICD-10-CM | POA: Diagnosis not present

## 2020-07-26 DIAGNOSIS — E119 Type 2 diabetes mellitus without complications: Secondary | ICD-10-CM | POA: Diagnosis not present

## 2020-07-26 DIAGNOSIS — F32A Depression, unspecified: Secondary | ICD-10-CM | POA: Diagnosis not present

## 2020-07-26 DIAGNOSIS — E118 Type 2 diabetes mellitus with unspecified complications: Secondary | ICD-10-CM | POA: Diagnosis not present

## 2020-07-27 DIAGNOSIS — R070 Pain in throat: Secondary | ICD-10-CM | POA: Diagnosis not present

## 2020-07-27 DIAGNOSIS — Z7982 Long term (current) use of aspirin: Secondary | ICD-10-CM | POA: Diagnosis not present

## 2020-07-27 DIAGNOSIS — E041 Nontoxic single thyroid nodule: Secondary | ICD-10-CM | POA: Diagnosis not present

## 2020-07-27 DIAGNOSIS — I509 Heart failure, unspecified: Secondary | ICD-10-CM | POA: Diagnosis not present

## 2020-07-27 DIAGNOSIS — R061 Stridor: Secondary | ICD-10-CM | POA: Diagnosis not present

## 2020-07-27 DIAGNOSIS — I110 Hypertensive heart disease with heart failure: Secondary | ICD-10-CM | POA: Diagnosis not present

## 2020-07-27 DIAGNOSIS — E119 Type 2 diabetes mellitus without complications: Secondary | ICD-10-CM | POA: Diagnosis not present

## 2020-07-27 DIAGNOSIS — E785 Hyperlipidemia, unspecified: Secondary | ICD-10-CM | POA: Diagnosis not present

## 2020-07-27 DIAGNOSIS — Z79899 Other long term (current) drug therapy: Secondary | ICD-10-CM | POA: Diagnosis not present

## 2020-07-27 DIAGNOSIS — K219 Gastro-esophageal reflux disease without esophagitis: Secondary | ICD-10-CM | POA: Diagnosis not present

## 2020-07-27 DIAGNOSIS — F32A Depression, unspecified: Secondary | ICD-10-CM | POA: Diagnosis not present

## 2020-07-27 DIAGNOSIS — C73 Malignant neoplasm of thyroid gland: Secondary | ICD-10-CM | POA: Diagnosis not present

## 2020-07-27 DIAGNOSIS — Z7984 Long term (current) use of oral hypoglycemic drugs: Secondary | ICD-10-CM | POA: Diagnosis not present

## 2020-07-27 DIAGNOSIS — E079 Disorder of thyroid, unspecified: Secondary | ICD-10-CM | POA: Diagnosis not present

## 2020-07-27 DIAGNOSIS — Z794 Long term (current) use of insulin: Secondary | ICD-10-CM | POA: Diagnosis not present

## 2020-07-27 DIAGNOSIS — Z79811 Long term (current) use of aromatase inhibitors: Secondary | ICD-10-CM | POA: Diagnosis not present

## 2020-07-27 DIAGNOSIS — R638 Other symptoms and signs concerning food and fluid intake: Secondary | ICD-10-CM | POA: Diagnosis not present

## 2020-07-30 NOTE — Assessment & Plan Note (Addendum)
She has upper airway stridor c/w tracheal impingement by goiter. Plan- CT soft tissues neck

## 2020-07-30 NOTE — Assessment & Plan Note (Signed)
Thee is a lot going on, but she does respond to bronchodilators so there is at least a component of reactive airways disease. Plan- refill Breo and albuterol hfa.

## 2020-07-30 NOTE — Assessment & Plan Note (Signed)
With dyspnea, a swollen leg, want to exclude PE/ DVT. Plan- labs, CTa chest

## 2020-07-30 NOTE — Assessment & Plan Note (Signed)
Benefits from CPAP with good compliance eand control Plan- continue CPAP auto 5-15

## 2020-07-31 ENCOUNTER — Other Ambulatory Visit (HOSPITAL_COMMUNITY): Payer: Medicare Other

## 2020-08-01 ENCOUNTER — Other Ambulatory Visit: Payer: Self-pay | Admitting: *Deleted

## 2020-08-01 NOTE — Patient Outreach (Signed)
[facility]) Care Management  08/01/2020  Ieesha Abbasi 1950/11/20 [id number]  Unsuccessful outreach attempt made to patient. Patient answered the phone and stated she would not be able to speak today. She did request that this nurse call her at a later date.   Plan: RN Health Coach will call patient within the month of March.  Emelia Loron RN, BSN [place] [phone] [email or web address]

## 2020-08-04 DIAGNOSIS — E890 Postprocedural hypothyroidism: Secondary | ICD-10-CM | POA: Diagnosis not present

## 2020-08-04 LAB — TSH: TSH: 12.31 — AB (ref <=5.90)

## 2020-08-04 LAB — COMPREHENSIVE METABOLIC PANEL: Calcium: 9.8 (ref 8.7–10.7)

## 2020-08-09 ENCOUNTER — Other Ambulatory Visit: Payer: Self-pay

## 2020-08-09 ENCOUNTER — Ambulatory Visit
Admission: RE | Admit: 2020-08-09 | Discharge: 2020-08-09 | Disposition: A | Discharge status: Home / Self Care | Payer: Medicare Other | Source: Ambulatory Visit | Attending: Family Medicine | Admitting: Family Medicine

## 2020-08-09 DIAGNOSIS — Z1231 Encounter for screening mammogram for malignant neoplasm of breast: Secondary | ICD-10-CM

## 2020-08-16 ENCOUNTER — Other Ambulatory Visit: Payer: Self-pay | Admitting: Family

## 2020-08-17 ENCOUNTER — Encounter: Payer: Self-pay | Admitting: Family Medicine

## 2020-08-21 ENCOUNTER — Other Ambulatory Visit: Payer: Self-pay

## 2020-08-21 MED ORDER — LEVOTHYROXINE SODIUM 150 MCG PO TABS: 150.0000 ug | ORAL_TABLET | Freq: Every day | ORAL | 1 refills | Status: DC

## 2020-08-21 NOTE — Telephone Encounter (Signed)
Medication sent into pharmacy  

## 2020-08-21 NOTE — Telephone Encounter (Signed)
It looks like [facility] put her on levothyroxine 150 mcg 07/28/20, I'm going to pull it into her medication list

## 2020-08-24 ENCOUNTER — Ambulatory Visit (HOSPITAL_COMMUNITY): Payer: Medicare Other | Attending: Cardiology

## 2020-08-24 ENCOUNTER — Other Ambulatory Visit: Payer: Self-pay

## 2020-08-24 DIAGNOSIS — I509 Heart failure, unspecified: Secondary | ICD-10-CM | POA: Insufficient documentation

## 2020-08-24 DIAGNOSIS — G4730 Sleep apnea, unspecified: Secondary | ICD-10-CM | POA: Diagnosis not present

## 2020-08-24 DIAGNOSIS — I517 Cardiomegaly: Secondary | ICD-10-CM | POA: Diagnosis not present

## 2020-08-24 DIAGNOSIS — Z853 Personal history of malignant neoplasm of breast: Secondary | ICD-10-CM | POA: Insufficient documentation

## 2020-08-24 DIAGNOSIS — I2510 Atherosclerotic heart disease of native coronary artery without angina pectoris: Secondary | ICD-10-CM | POA: Diagnosis not present

## 2020-08-24 DIAGNOSIS — I110 Hypertensive heart disease with heart failure: Secondary | ICD-10-CM | POA: Diagnosis not present

## 2020-08-24 DIAGNOSIS — D649 Anemia, unspecified: Secondary | ICD-10-CM | POA: Insufficient documentation

## 2020-08-24 DIAGNOSIS — E669 Obesity, unspecified: Secondary | ICD-10-CM | POA: Diagnosis not present

## 2020-08-24 DIAGNOSIS — E119 Type 2 diabetes mellitus without complications: Secondary | ICD-10-CM | POA: Diagnosis not present

## 2020-08-24 DIAGNOSIS — I77810 Thoracic aortic ectasia: Secondary | ICD-10-CM | POA: Diagnosis not present

## 2020-08-24 DIAGNOSIS — E785 Hyperlipidemia, unspecified: Secondary | ICD-10-CM | POA: Insufficient documentation

## 2020-08-24 LAB — ECHOCARDIOGRAM COMPLETE
Area-P 1/2: 4.21 cm2
S' Lateral: 4 cm

## 2020-08-25 ENCOUNTER — Encounter: Payer: Self-pay | Admitting: Cardiology

## 2020-08-28 ENCOUNTER — Telehealth: Payer: Self-pay

## 2020-08-28 ENCOUNTER — Other Ambulatory Visit: Payer: Self-pay | Admitting: *Deleted

## 2020-08-28 ENCOUNTER — Encounter: Payer: Self-pay | Admitting: *Deleted

## 2020-08-28 DIAGNOSIS — I77810 Thoracic aortic ectasia: Secondary | ICD-10-CM

## 2020-08-28 NOTE — Telephone Encounter (Signed)
-----   Message from Sueanne Margarita, MD sent at 08/25/2020  9:15 AM EST ----- Echo showed low normal LVF with severely thickened heart muscle and mildly enlarged aortic root at 23mm.  Repeat 2D echo in 1 year for aortic root.

## 2020-08-28 NOTE — Telephone Encounter (Signed)
The patient has been notified of the result and verbalized understanding.  All questions (if any) were answered. Antonieta Iba, RN 08/28/2020 3:53 PM

## 2020-08-28 NOTE — Patient Outreach (Signed)
[name]) Care Management  [name]  08/29/2020   [name] 13-May-1951 [id number]  Subjective: Successful telephone outreach call to patient. HIPAA identifiers obtained. Patient reports she is doing well. Patient states that she is recovering well from her Thyroidectomy. She stayed with her daughter for a while and is now currently back home. Patient states that she has no swallowing issues, her breathing has improved tremendously, and she is not experiencing any pain. Her only surgical residual is hoarseness which the surgeon explains will improve over time. Patient denies any recent falls, reports no needs for DME, and explains she has excellent support from her family. Patient is now focused on increasing her mobility, strength, and losing weight for her overall health. She plans to return to the [place] weight loss program when she recovers more fully and would like for this nurse to send her chair exercise printouts to help her start to increase her physical activity. Patient reports that her diabetes has been under control since she was [facility] from [name]. Patient states her blood sugar ranges are 100-130's currently and she wants to work towards decreasing her A1c to 7 or below. Patient explains that  due to her recovering from surgery she has not been able to begin her diabetic and weight loss diet like she would like because she is mainly heating food in the microwave and preparing foods that do not take a lot of preparation. Nurse will send patient a planning healthy meals booklet. Patient did not have any further questions or concerns today.    Encounter Medications:  Outpatient Encounter Medications as of 08/28/2020  Medication Sig  . acetaminophen (TYLENOL) 500 MG tablet Take 1,000 mg by mouth 2 (two) times daily as needed for moderate pain or headache.  . albuterol (VENTOLIN HFA) 108 (90 Base) MCG/ACT inhaler Inhale 2 puffs every 4-6 hours  as needed - rescue.  [name] anastrozole (ARIMIDEX) 1 MG tablet Take 1 tablet (1 mg total) by mouth daily.  . busPIRone (BUSPAR) 7.5 MG tablet Take 1 tablet (7.5 mg total) by mouth 3 (three) times daily.  . carvedilol (COREG) 12.5 MG tablet Take 1 tablet 2 times daily with a meal.  . diclofenac (VOLTAREN) 75 MG EC tablet Take 1 tablet (75 mg total) by mouth 2 (two) times daily.  [name] diltiazem (CARDIZEM CD) 120 MG 24 hr capsule Take 1 capsule (120 mg total) by mouth daily.  [name] Calcium (STOOL SOFTENER PO) Take by mouth daily.  [name] escitalopram (LEXAPRO) 20 MG tablet TAKE 1 TABLET EVERY MORNING AND 1/2 EVERY EVENING  . esomeprazole (NEXIUM) 40 MG capsule Take 1 capsule (40 mg total) by mouth daily.  [name] ezetimibe (ZETIA) 10 MG tablet Take 1 tablet (10 mg total) by mouth daily.  . fluticasone (FLONASE) 50 MCG/ACT nasal spray Place 2 sprays into both nostrils daily.  . fluticasone furoate-vilanterol (BREO ELLIPTA) 100-25 MCG/INH AEPB Inhale 1 puff then rinse mouth, once daily  . furosemide (LASIX) 20 MG tablet Take 1 tablet (20 mg total) by mouth daily.  [name] glucose blood (ACCU-CHEK AVIVA) test strip Twice daily (please use brand that patient insurance covers)  . Hypromellose (ARTIFICIAL TEARS OP) Place 1 drop into both eyes daily as needed (dry eyes).   [name] levothyroxine (SYNTHROID) 150 MCG tablet Take 1 tablet (150 mcg total) by mouth daily before breakfast.  . liraglutide (VICTOZA) 18 MG/3ML SOPN Start 0.6mg  SQ once a day for 7 days, then increase to 1.2mg  SQ  once a day x 7 days and then 1.8 mg SQ daily  . liraglutide (VICTOZA) 18 MG/3ML SOPN Inject 1.8 mg into the skin daily.  [name] loratadine (CLARITIN) 10 MG tablet Take 1 tablet (10 mg total) by mouth daily.  [name] losartan (COZAAR) 100 MG tablet Take 1 tablet (100 mg total) by mouth daily.  . meclizine (ANTIVERT) 25 MG tablet Take 25 mg by mouth every 6 (six) hours as needed.  . melatonin 5 MG TABS Take by mouth.  . metFORMIN (GLUCOPHAGE) 500 MG tablet Take  2 tablets (1,000 mg total) by mouth 2 (two) times daily with a meal.  . montelukast (SINGULAIR) 10 MG tablet Take 1 tablet (10 mg total) by mouth at bedtime.  . rosuvastatin (CRESTOR) 40 MG tablet Take 1 tablet (40 mg total) by mouth daily.  . Vitamin D, Ergocalciferol, (DRISDOL) 1.25 MG (50000 UNIT) CAPS capsule Take 1 capsule (50,000 Units total) by mouth every 7 (seven) days.   No facility-administered encounter medications on file as of 08/28/2020.    Functional Status:  In your present state of health, do you have any difficulty performing the following activities: 07/04/2020 09/23/2019  Hearing? N N  Vision? N N  Difficulty concentrating or making decisions? N N  Walking or climbing stairs? Y N  Comment knee problems -  Dressing or bathing? N N  Doing errands, shopping? N N  Preparing Food and eating ? N N  Using the Toilet? N N  In the past six months, have you accidently leaked urine? Y N  Comment some incontinence at times -  Do you have problems with loss of bowel control? N N  Managing your Medications? N N  Managing your Finances? N N  Housekeeping or managing your Housekeeping? N N  Some recent data might be hidden    Fall/Depression Screening: Fall Risk  08/28/2020 07/04/2020 10/18/2019  Falls in the past year? 1 0 0  Number falls in past yr: 0 0 -  Injury with Fall? 0 0 -  Comment - - -  Risk for fall due to : Impaired balance/gait;History of fall(s);Impaired mobility - -  Risk for fall due to: Comment - - -  Follow up Education provided;Falls evaluation completed;Falls prevention discussed - -   PHQ 2/9 Scores 08/28/2020 07/04/2020 10/18/2019 09/23/2019 09/06/2019 04/02/2019 09/18/2018  PHQ - 2 Score 1 0 0 0 2 0 0  PHQ- 9 Score - - - - 9 - -  Exception Documentation - - - - - - -    Assessment:  Goals Addressed              This Visit's Progress     Patient Stated   .  [name]) Increase physical activity (pt-stated)        Timeframe:  Long-Range Goal Priority:   High Start Date: 08/28/20                            Expected End Date: 09/20/21  -Discussed beginning by doing chair exercises  Notes: patient states she will start doing chair exercises with a goal of eventually walking to increase her physical activity. Nurse will send patient chair exercise printouts.                           .  Weight (lb) < 300 lb (136.1 kg) (pt-stated)        Timeframe:  Long-Range Goal Priority:  High Start Date:  08/28/20                           Expected End Date: 09/20/21      -Encourage patient to set obtainable goal -Discussed going back to [facility] [place] patient to begin doing chair exercises -Discussed reducing calories and portion size -  Encouraged patient to monitor carbohydrates and sugar  Notes: Patient states she wants to lose weight for her overall health. Patient explained she was doing well with the [place] [facility] program and plans to go back once she recovers from her surgery. Nurse will send a planning healthy meals booklet and chair exercise printouts.                     Other   .  [name]) Set My Target A1C-Diabetes Type 2        Timeframe:  Long-Range Goal Priority:  High Start Date: 08/28/20                            Expected End Date: 08/28/21                      Follow Up Date 12/21/20    - set target A1C    Why is this important?    Your target A1C is decided together by you and your doctor.   It is based on several things like your age and other health issues.    Notes: Patient states she would like to to lower her A1c to 7 or below. Her current A1c is 9.5.    [name]  COMPLETED: [facility]-Make and Keep All Appointments        Timeframe:  Long-Range Goal Priority:  High Start Date:    07/04/20                         Expected End Date:    08/28/20                  Follow Up Date 08/28/20 - arrange a ride through an agency 1 week before appointment - keep a calendar with appointment dates    Why is  this important?    Part of staying healthy is seeing the doctor for follow-up care.   If you forget your appointments, there are some things you can do to stay on track.    Notes: Updated 08/28/20: Patient reports no difficulties managing her provider appointments or having any transportation needs or concerns.    .  [facility]-Monitor and Manage My Blood Sugar-Diabetes Type 2        Timeframe:  Long-Range Goal Priority:  High Start Date:   07/04/20                          Expected End Date:  05/23/22                   Follow Up Date 12/21/20   - check blood sugar at prescribed times - take the blood sugar log to all doctor visits   -Encouraged patient to follow a ADA diet -Discussed increasing her physical activity as tolerated.  Why is this important?    Checking your blood sugar at home helps to keep it  from getting very high or very low.   Writing the results in a diary or log helps the doctor know how to care for you.   Your blood sugar log should have the time, date and the results.   Also, write down the amount of insulin or other medicine that you take.   Other information, like what you ate, exercise done and how you were feeling, will also be helpful.     Notes: Updated 08/28/20: Patient reports taking her blood sugar twice daily. She explains that her blood sugar ranges have improved since her discharge from the [name] with ranges of 100-130's.        Plan: RN Health Coach will send PCP a quarterly report, will send patient planning healthy meals booklet and chair exercise printouts, and will call patient within the month of May. Follow-up:  Patient agrees to Care Plan and Follow-up.  Emelia Loron RN, BSN [place] [phone] [email or web address]

## 2020-08-29 ENCOUNTER — Telehealth (INDEPENDENT_AMBULATORY_CARE_PROVIDER_SITE_OTHER): Payer: Medicare Other | Admitting: Family Medicine

## 2020-08-29 ENCOUNTER — Other Ambulatory Visit: Payer: Self-pay

## 2020-08-29 DIAGNOSIS — E7800 Pure hypercholesterolemia, unspecified: Secondary | ICD-10-CM | POA: Diagnosis not present

## 2020-08-29 DIAGNOSIS — R0600 Dyspnea, unspecified: Secondary | ICD-10-CM

## 2020-08-29 DIAGNOSIS — I10 Essential (primary) hypertension: Secondary | ICD-10-CM

## 2020-08-29 DIAGNOSIS — E890 Postprocedural hypothyroidism: Secondary | ICD-10-CM | POA: Diagnosis not present

## 2020-08-29 DIAGNOSIS — R0609 Other forms of dyspnea: Secondary | ICD-10-CM

## 2020-08-29 DIAGNOSIS — E1165 Type 2 diabetes mellitus with hyperglycemia: Secondary | ICD-10-CM

## 2020-08-29 DIAGNOSIS — Z9889 Other specified postprocedural states: Secondary | ICD-10-CM

## 2020-08-29 DIAGNOSIS — E559 Vitamin D deficiency, unspecified: Secondary | ICD-10-CM

## 2020-08-29 MED ORDER — VICTOZA 18 MG/3ML ~~LOC~~ SOPN
1.8000 mg | PEN_INJECTOR | Freq: Every day | SUBCUTANEOUS | 3 refills | Status: DC
Start: 2020-08-29 — End: 2020-12-05

## 2020-08-29 MED ORDER — VICTOZA 18 MG/3ML ~~LOC~~ SOPN: PEN_INJECTOR | SUBCUTANEOUS | 0 refills | Status: DC

## 2020-08-29 NOTE — Progress Notes (Signed)
[name], female    DOB: 1950/12/03  Age: 70 y.o. MRN: [id number]   Virtual Visit via Video Note  I connected with [name] on 08/29/20 at  2:00 PM EST by a video enabled telemedicine application and verified that I am speaking with the correct person using two identifiers.  Location: Patient: home, patient and provider in visit Provider: home   I discussed the limitations of evaluation and management by telemedicine and the availability of in person appointments. The patient expressed understanding and agreed to proceed. [name], CMA was able to get the patient set up on a video visit    Subjective:  Subjective  HPI [name] presents for virtual visit today. She is at home recovering from her Thyroidectomy she underwent at [facility] in late January. She is feeling well. No recent febrile illness or other hospitalization. She is breathing much better and pleased with her recovery thus far. She tolerated Victoza and it worked well but she has run out of it and been without it for a few days. Denies CP/palp/HA/congestion/fevers/GI or GU c/o. Taking meds as prescribed  Review of Systems  Constitutional: Positive for fatigue. Negative for chills, fever and unexpected weight change.  HENT: Negative for ear pain, hearing loss, rhinorrhea, sinus pain and sore throat.   Eyes: Negative for pain.  Respiratory: Positive for shortness of breath. Negative for cough and chest tightness.   Cardiovascular: Negative for palpitations and leg swelling.  Gastrointestinal: Negative for constipation, diarrhea and nausea.  Genitourinary: Negative for difficulty urinating, dysuria, flank pain, frequency, hematuria, urgency, vaginal discharge and vaginal pain.  Musculoskeletal: Negative for arthralgias and myalgias.  Skin: Negative for rash.  Neurological: Negative for syncope and headaches.  Hematological: Negative for adenopathy.  Psychiatric/Behavioral: Negative for  agitation and dysphoric mood.    History Past Medical History:  Diagnosis Date  . Anemia 04/05/2012  . Anxiety   . Anxiety state 09/11/2007   Qualifier: Diagnosis of  By: Scherrie Gerlach    . Asthma   . Atrial tachycardia, paroxysmal ([place])   . Back pain 10/02/2014  . Breast cancer ([place]) 02/17/2017  . CAD (coronary artery disease), native coronary artery    remote Cath with nonobstructive ASCAD with 20% mid LAD, 20% prox left circ and 20% ostial RCA  . Chicken pox as a child  . Chronic diastolic CHF (congestive heart failure), NYHA class 1 ([place])   . Complication of anesthesia    pt states feels different in her body after anesthesia when waking up and also experiences a smell of burnt plastic for approx a wk   . Constipation   . Depression   . Diabetes ([place])   . Dilated aortic root ([place])    24mm by echo 08/2020  . Dysuria 02/17/2017  . Elevated LFTs 04/05/2012  . GERD (gastroesophageal reflux disease)   . Goiter   . Heart murmur    hx of one at birth   . History of kidney stones   . History of radiation therapy 10/31/16-12/17/16   left breast 45 Gy in 25 fractions, left breast boost 16 Gy in 8 fractions  . Hx of colonic polyps   . Hyperlipidemia   . Hypertension   . Insomnia 10/08/2016  . Joint pain   . Malignant neoplasm of upper-outer quadrant of left female breast ([place]) 05/14/2016   not with patient  . Measles as a child  . Mumps as a child  . OA (osteoarthritis) of  knee 04/05/2012  . Obesity 07/11/2014  . PONV (postoperative nausea and vomiting)   . Preventative health care 09/05/2013  . PVC's (premature ventricular contractions) 05/02/2016  . Shortness of breath dyspnea    walking distances / climbing stairs  . Sleep apnea 04/05/2012  . Tinea corporis 02/23/2013  . Type 2 diabetes mellitus with hyperglycemia ([place]) 12/31/2013  . Vasomotor rhinitis 04/05/2012  . Ventral hernia   . Wheezing     She has a past surgical history that includes Cholecystectomy; Meniscus repair  (2009); Total knee arthroplasty (2011); Knee arthroscopy (05/2010); Mouth surgery; Hernia repair (02/08/11); Cesarean section; Wisdom tooth extraction (2000); Tubal ligation; Cardiovascular stress test; Total knee arthroplasty (Right, 05/10/2014); left and right heart catheterization with coronary angiogram (N/A, 11/08/2013); Joint replacement; Colonoscopy with propofol (N/A, 03/28/2015); Cardiac catheterization; Breast lumpectomy with radioactive seed and sentinel lymph node biopsy (Left, 06/21/2016); Portacath placement (Right, 07/16/2016); Port-a-cath removal (N/A, 01/24/2017); Breast lumpectomy (Left, 2017); and Repair dural / CSF leak (2021).   Her family history includes Alcohol abuse in her paternal grandfather; Anxiety disorder in her mother; Arthritis in her father; Cancer in her maternal aunt and maternal grandmother; Coronary artery disease in her brother and father; Depression in her mother; Heart attack in her maternal grandfather; Heart attack (age of onset: 53) in her father; Heart disease in her brother; High Cholesterol in her father; Hyperlipidemia in her mother; Hypertension in her father; Thyroid disease in her mother.She reports that she has never smoked. She has never used smokeless tobacco. She reports previous alcohol use. She reports that she does not use drugs.  Current Outpatient Medications on File Prior to Visit  Medication Sig Dispense Refill  . acetaminophen (TYLENOL) 500 MG tablet Take 1,000 mg by mouth 2 (two) times daily as needed for moderate pain or headache.    . albuterol (VENTOLIN HFA) 108 (90 Base) MCG/ACT inhaler Inhale 2 puffs every 4-6 hours as needed - rescue. 18 g 12  . anastrozole (ARIMIDEX) 1 MG tablet Take 1 tablet (1 mg total) by mouth daily. 90 tablet 3  . busPIRone (BUSPAR) 7.5 MG tablet Take 1 tablet (7.5 mg total) by mouth 3 (three) times daily. 90 tablet 3  . carvedilol (COREG) 12.5 MG tablet Take 1 tablet 2 times daily with a meal. 60 tablet 2  . diclofenac  (VOLTAREN) 75 MG EC tablet Take 1 tablet (75 mg total) by mouth 2 (two) times daily. 180 tablet 0  . diltiazem (CARDIZEM CD) 120 MG 24 hr capsule Take 1 capsule (120 mg total) by mouth daily. 90 capsule 3  . Docusate Calcium (STOOL SOFTENER PO) Take by mouth daily.    Marland Kitchen escitalopram (LEXAPRO) 20 MG tablet TAKE 1 TABLET EVERY MORNING AND 1/2 EVERY EVENING 135 tablet 1  . esomeprazole (NEXIUM) 40 MG capsule Take 1 capsule (40 mg total) by mouth daily. 90 capsule 3  . ezetimibe (ZETIA) 10 MG tablet Take 1 tablet (10 mg total) by mouth daily. 90 tablet 3  . fluticasone (FLONASE) 50 MCG/ACT nasal spray Place 2 sprays into both nostrils daily. 16 g 6  . fluticasone furoate-vilanterol (BREO ELLIPTA) 100-25 MCG/INH AEPB Inhale 1 puff then rinse mouth, once daily 60 each 12  . furosemide (LASIX) 20 MG tablet Take 1 tablet (20 mg total) by mouth daily. 90 tablet 3  . glucose blood (ACCU-CHEK AVIVA) test strip Twice daily (please use brand that patient insurance covers) 100 each 5  . Hypromellose (ARTIFICIAL TEARS OP) Place 1 drop into both eyes  daily as needed (dry eyes).     Marland Kitchen levothyroxine (SYNTHROID) 150 MCG tablet Take 1 tablet (150 mcg total) by mouth daily before breakfast. 90 tablet 1  . loratadine (CLARITIN) 10 MG tablet Take 1 tablet (10 mg total) by mouth daily. 30 tablet 11  . losartan (COZAAR) 100 MG tablet Take 1 tablet (100 mg total) by mouth daily. 90 tablet 3  . meclizine (ANTIVERT) 25 MG tablet Take 25 mg by mouth every 6 (six) hours as needed.    . melatonin 5 MG TABS Take by mouth.    . metFORMIN (GLUCOPHAGE) 500 MG tablet Take 2 tablets (1,000 mg total) by mouth 2 (two) times daily with a meal. 360 tablet 0  . montelukast (SINGULAIR) 10 MG tablet Take 1 tablet (10 mg total) by mouth at bedtime. 90 tablet 3  . rosuvastatin (CRESTOR) 40 MG tablet Take 1 tablet (40 mg total) by mouth daily. 90 tablet 3  . Vitamin D, Ergocalciferol, (DRISDOL) 1.25 MG (50000 UNIT) CAPS capsule Take 1 capsule  (50,000 Units total) by mouth every 7 (seven) days. 4 capsule 4   No current facility-administered medications on file prior to visit.     Objective:  Objective  Physical Exam Constitutional:      Appearance: Normal appearance. She is obese. She is not ill-appearing.  HENT:     Head: Normocephalic and atraumatic.     Right Ear: External ear normal.     Left Ear: External ear normal.     Nose: Nose normal.  Neck:     Comments: Anterior scar healing well. No surrounding erythema or fluctuance Musculoskeletal:     Cervical back: Neck supple.  Neurological:     Mental Status: She is alert.    There were no vitals taken for this visit. Wt Readings from Last 3 Encounters:  07/17/20 (!) 311 lb 9.6 oz (141.3 kg)  06/19/20 (!) 312 lb 6.4 oz (141.7 kg)  06/02/20 (!) 308 lb (139.7 kg)     Lab Results  Component Value Date   WBC 7.2 06/19/2020   HGB 12.0 06/19/2020   HCT 38.3 06/19/2020   PLT 255.0 06/19/2020   GLUCOSE 190 (H) 06/02/2020   CHOL 141 06/02/2020   TRIG 246 (H) 06/02/2020   HDL 40 (L) 06/02/2020   LDLDIRECT 108.0 10/08/2016   LDLCALC 67 06/02/2020   ALT 32 (H) 06/02/2020   AST 40 (H) 06/02/2020   NA 139 06/02/2020   K 4.5 06/02/2020   CL 100 06/02/2020   CREATININE 0.74 06/02/2020   BUN 13 06/02/2020   CO2 28 06/02/2020   TSH 12.31 (A) 08/04/2020   INR 1.01 05/03/2014   HGBA1C 9.5 (H) 06/02/2020    MM 3D SCREEN BREAST BILATERAL  Result Date: 08/14/2020 CLINICAL DATA:  Screening. EXAM: DIGITAL SCREENING BILATERAL MAMMOGRAM WITH TOMOSYNTHESIS AND CAD TECHNIQUE: Bilateral screening digital craniocaudal and mediolateral oblique mammograms were obtained. Bilateral screening digital breast tomosynthesis was performed. The images were evaluated with computer-aided detection. COMPARISON:  Previous exam(s). ACR Breast Density Category b: There are scattered areas of fibroglandular density. FINDINGS: There are no findings suspicious for malignancy. IMPRESSION: No  mammographic evidence of malignancy. A result letter of this screening mammogram will be mailed directly to the patient. RECOMMENDATION: Screening mammogram in one year. (Code:SM-B-01Y) BI-RADS CATEGORY  1: Negative. Electronically Signed   By: Abelardo Diesel M.D.   On: 08/14/2020 14:46     Assessment & Plan:  Plan   Meds ordered this encounter  Medications  .  liraglutide (VICTOZA) 18 MG/3ML SOPN    Sig: Inject 1.8 mg into the skin daily.    Dispense:  27 mL    Refill:  3  . liraglutide (VICTOZA) 18 MG/3ML SOPN    Sig: Start 0.6mg  SQ once a day for 7 days, then increase to 1.2mg  SQ once a day x 7 days and then 1.8 mg SQ daily    Dispense:  6 mL    Refill:  0    Problem List Items Addressed This Visit    Hyperlipidemia   Relevant Orders   Lipid panel   Essential hypertension - Primary    Monitor and report any concerns, no changes to meds. Encouraged heart healthy diet such as the DASH diet and exercise as tolerated.       Relevant Orders   CBC   Comprehensive metabolic panel   Dyspnea on exertion    Much better since her surgery. She will now attempt to slowly increase her conditioning as tolerated.       Type 2 diabetes mellitus with hyperglycemia (HCC)    Her sugars have been better since she returned home from the hospital after her thyroidectomy. She did run out of her victoza as she did not know she had refills. New prescriptions are sent in for her to retitrate as it was working well.       Relevant Medications   liraglutide (VICTOZA) 18 MG/3ML SOPN   liraglutide (VICTOZA) 18 MG/3ML SOPN   Other Relevant Orders   Hemoglobin A1c   Vitamin D deficiency    Supplement and monitor      Relevant Orders   VITAMIN D 25 Hydroxy (Vit-D Deficiency, Fractures)   H/O thyroidectomy    She had surgery in January at [facility] and is home recovering well.          Follow-up: Return in about 3 months (around 11/29/2020) for F/U.    I discussed the assessment and treatment plan  with the patient. The patient was provided an opportunity to ask questions and all were answered. The patient agreed with the plan and demonstrated an understanding of the instructions.   The patient was advised to call back or seek an in-person evaluation if the symptoms worsen or if the condition fails to improve as anticipated.  I provided 20 minutes of non-face-to-face time during this encounter.  [name], MD

## 2020-08-29 NOTE — Patient Instructions (Addendum)
Goals Addressed              This Visit's Progress     Patient Stated   .  [facility]) Increase physical activity (pt-stated)        Timeframe:  Long-Range Goal Priority:  High Start Date: 08/28/20                            Expected End Date: 09/20/21  -Discussed beginning by doing chair exercises  Notes: patient states she will start doing chair exercises with a goal of eventually walking to increase her physical activity. Nurse will send patient chair exercise printouts.                           .  Weight (lb) < 300 lb (136.1 kg) (pt-stated)        Timeframe:  Long-Range Goal Priority:  High Start Date:  08/28/20                           Expected End Date: 09/20/21      -Encourage patient to set obtainable goal -Discussed going back to [facility] [place] patient to begin doing chair exercises -Discussed reducing calories and portion size -  Encouraged patient to monitor carbohydrates and sugar  Notes: Patient states she wants to lose weight for her overall health. Patient explained she was doing well with the [place] [facility] and plans to go back once she recovers from her surgery. Nurse will send a planning healthy meals booklet and chair exercise printouts.                     Other   .  [facility]) Set My Target A1C-Diabetes Type 2        Timeframe:  Long-Range Goal Priority:  High Start Date: 08/28/20                            Expected End Date: 08/28/21                      Follow Up Date 12/21/20    - set target A1C    Why is this important?    Your target A1C is decided together by you and your doctor.   It is based on several things like your age and other health issues.    Notes: Patient states she would like to to lower her A1c to 7 or below. Her current A1c is 9.5.    Marland Kitchen  COMPLETED: [facility]-Make and Keep All Appointments        Timeframe:  Long-Range Goal Priority:  High Start Date:    07/04/20                         Expected  End Date:    08/28/20                  Follow Up Date 08/28/20 - arrange a ride through an agency 1 week before appointment - keep a calendar with appointment dates    Why is this important?    Part of staying healthy is seeing the doctor for follow-up care.   If you forget your appointments, there are some things you can do to  stay on track.    Notes: Updated 08/28/20: Patient reports no difficulties managing her provider appointments or having any transportation needs or concerns.    .  [facility]-Monitor and Manage My Blood Sugar-Diabetes Type 2        Timeframe:  Long-Range Goal Priority:  High Start Date:   07/04/20                          Expected End Date:  05/23/22                   Follow Up Date 12/21/20   - check blood sugar at prescribed times - take the blood sugar log to all doctor visits   -Encouraged patient to follow a ADA diet -Discussed increasing her physical activity as tolerated.  Why is this important?    Checking your blood sugar at home helps to keep it from getting very high or very low.   Writing the results in a diary or log helps the doctor know how to care for you.   Your blood sugar log should have the time, date and the results.   Also, write down the amount of insulin or other medicine that you take.   Other information, like what you ate, exercise done and how you were feeling, will also be helpful.     Notes: Updated 08/28/20: Patient reports taking her blood sugar twice daily. She explains that her blood sugar ranges have improved since her discharge from the hospital with ranges of 100-130's.

## 2020-08-30 DIAGNOSIS — E890 Postprocedural hypothyroidism: Secondary | ICD-10-CM | POA: Insufficient documentation

## 2020-08-30 NOTE — Assessment & Plan Note (Signed)
Monitor and report any concerns, no changes to meds. Encouraged heart healthy diet such as the DASH diet and exercise as tolerated.  ?

## 2020-08-30 NOTE — Assessment & Plan Note (Signed)
Her sugars have been better since she returned home from the hospital after her thyroidectomy. She did run out of her victoza as she did not know she had refills. New prescriptions are sent in for her to retitrate as it was working well.

## 2020-08-30 NOTE — Assessment & Plan Note (Signed)
She had surgery in January at [facility] and is home recovering well.

## 2020-08-30 NOTE — Assessment & Plan Note (Signed)
Supplement and monitor 

## 2020-08-30 NOTE — Assessment & Plan Note (Signed)
Much better since her surgery. She will now attempt to slowly increase her conditioning as tolerated.

## 2020-09-01 ENCOUNTER — Ambulatory Visit: Payer: Self-pay | Admitting: *Deleted

## 2020-09-05 ENCOUNTER — Other Ambulatory Visit: Payer: Self-pay

## 2020-09-05 ENCOUNTER — Other Ambulatory Visit (INDEPENDENT_AMBULATORY_CARE_PROVIDER_SITE_OTHER): Payer: Medicare Other

## 2020-09-05 DIAGNOSIS — E7800 Pure hypercholesterolemia, unspecified: Secondary | ICD-10-CM

## 2020-09-05 DIAGNOSIS — E1165 Type 2 diabetes mellitus with hyperglycemia: Secondary | ICD-10-CM | POA: Diagnosis not present

## 2020-09-05 DIAGNOSIS — I10 Essential (primary) hypertension: Secondary | ICD-10-CM | POA: Diagnosis not present

## 2020-09-05 DIAGNOSIS — E559 Vitamin D deficiency, unspecified: Secondary | ICD-10-CM

## 2020-09-05 LAB — CBC
HCT: 35.4 % — ABNORMAL LOW (ref 36.0–46.0)
Hemoglobin: 11.1 g/dL — ABNORMAL LOW (ref 12.0–15.0)
MCHC: 31.4 g/dL (ref 30.0–36.0)
MCV: 78.8 fl (ref 78.0–100.0)
Platelets: 288 10*3/uL (ref 150.0–400.0)
RBC: 4.49 Mil/uL (ref 3.87–5.11)
RDW: 18.2 % — ABNORMAL HIGH (ref 11.5–15.5)
WBC: 9.4 10*3/uL (ref 4.0–10.5)

## 2020-09-05 LAB — COMPREHENSIVE METABOLIC PANEL
ALT: 20 U/L (ref 0–35)
AST: 19 U/L (ref 0–37)
Albumin: 4 g/dL (ref 3.5–5.2)
Alkaline Phosphatase: 79 U/L (ref 39–117)
BUN: 26 mg/dL — ABNORMAL HIGH (ref 6–23)
CO2: 28 mEq/L (ref 19–32)
Calcium: 9.5 mg/dL (ref 8.4–10.5)
Chloride: 100 mEq/L (ref 96–112)
Creatinine, Ser: 1.03 mg/dL (ref 0.40–1.20)
GFR: 55.37 mL/min — ABNORMAL LOW (ref >60.00)
Glucose, Bld: 196 mg/dL — ABNORMAL HIGH (ref 70–99)
Potassium: 4.8 mEq/L (ref 3.5–5.1)
Sodium: 138 mEq/L (ref 135–145)
Total Bilirubin: 0.5 mg/dL (ref 0.2–1.2)
Total Protein: 7.1 g/dL (ref 6.0–8.3)

## 2020-09-05 LAB — LIPID PANEL
Cholesterol: 102 mg/dL (ref 0–200)
HDL: 32.9 mg/dL — ABNORMAL LOW (ref >39.00)
NonHDL: 69.47
Total CHOL/HDL Ratio: 3
Triglycerides: 297 mg/dL — ABNORMAL HIGH (ref 0.0–149.0)
VLDL: 59.4 mg/dL — ABNORMAL HIGH (ref 0.0–40.0)

## 2020-09-05 LAB — LDL CHOLESTEROL, DIRECT: Direct LDL: 42 mg/dL

## 2020-09-05 LAB — VITAMIN D 25 HYDROXY (VIT D DEFICIENCY, FRACTURES): VITD: 16.52 ng/mL — ABNORMAL LOW (ref 30.00–100.00)

## 2020-09-05 LAB — HEMOGLOBIN A1C: Hgb A1c MFr Bld: 8.3 % — ABNORMAL HIGH (ref 4.6–6.5)

## 2020-09-06 ENCOUNTER — Other Ambulatory Visit: Payer: Self-pay

## 2020-09-06 MED ORDER — FENOFIBRATE 120 MG PO TABS: 1.0000 | ORAL_TABLET | Freq: Every day | ORAL | 3 refills | Status: DC

## 2020-09-07 ENCOUNTER — Other Ambulatory Visit: Payer: Self-pay | Admitting: Family Medicine

## 2020-09-07 ENCOUNTER — Telehealth: Payer: Self-pay | Admitting: Family Medicine

## 2020-09-07 MED ORDER — FENOFIBRATE 145 MG PO TABS: 145.0000 mg | ORAL_TABLET | Freq: Every day | ORAL | 3 refills | Status: DC

## 2020-09-07 NOTE — Telephone Encounter (Signed)
Caller: [place]   Patient's medication : Fenofibrate 120 MG TABS  requires a PA, per pharmacist this is not a common dosage and they would like to see if Dr Charlett Blake will adjust to another dosage like 135,145,160 mg.  Please advise

## 2020-09-07 NOTE — Telephone Encounter (Signed)
I have sent in the Fenofibrate 120 mg

## 2020-09-14 ENCOUNTER — Other Ambulatory Visit: Payer: Medicare Other

## 2020-09-15 NOTE — Progress Notes (Deleted)
HPI female never smoker followed for OSA, lung restriction, dyspnea on exertion, asthmatic bronchitis, complicated by chronic sinusitis, anxiety and depression, paroxysmal atrial tachycardia, CAD, breast cancer left, morbid obesity Unattended Home Sleep Test 11/05/13-AHI 68.1/hour, desaturation to 62%, body weight 320 pounds Echocardiogram 12/04/16- Nl EF Office Spirometry 02/06/17-moderate restriction of exhaled volume. FVC 2.02/61%, FEV1 1.75/69%, ratio 0.86, FEF 25-75% 2.45/113% PFT- 03/12/17-moderate obstructive airways disease with significant response to bronchodilator, normal diffusion. FVC 2.25/69%, FEV1 1.88/76%, ratio 0.84, TLC 80%, DLCO 86% --------------------------------------------------------------------------------------------.  06/19/20- 70 year old female never smoker followed for OSA, lung restriction, dyspnea on exertion, asthmatic bronchitis, complicated by chronic sinusitis, anxiety and depression, paroxysmal atrial tachycardia, CAD, breast cancer left, morbid obesity, Goiter, CSF leak treated at [facility] with ENT f/u at [facility],  CPAP auto 5-15/Adapt Albuterol hfa, Breo 100, singulair Download- 97%, AHI 2.1/ hr Body weight today- 312 lbs Covid vax-3 Moderna Flu vax- had Recently able to resume CPAP use after sgy for CSF leak. Considers CPAP a big help for sleep.  Asthma- . Notes DOE- ok at rest- since surgery in June. Some wheeze- rescue hfa does help. L leg swells. No chest pain.No hx VTE. Sees Dr Turner./ cardiology. Hx chemo and XRT L breast.   09/18/20- 70 year old female never smoker followed for OSA, lung restriction, dyspnea on exertion, asthmatic bronchitis, complicated by chronic sinusitis, anxiety and depression, paroxysmal atrial tachycardia, CAD, breast cancer left, morbid obesity, Goiter, CSF leak treated at [facility] with ENT f/u at [facility], HTN, Thyroidectomy/Hypothyroid,  -Albuterol hfa, Breo 100, singulair CPAP auto 5-15/Adapt Download- Body weight  today- Covid vax- Flu vax- Had thyroidectomy 07/24/20 for goiter encasing trachea and small papillary thyroid carcinoma  CTa chest 06/22/21-  IMPRESSION: 1. No pulmonary emboli. 2. Enlarged central pulmonary arteries, compatible with pulmonary arterial hypertension. 3. Small pericardial effusion. 4. Diffusely enlarged and heterogeneous thyroid gland with extension into the superior aspect of the mediastinum. At the thoracic inlet, this is encasing the trachea and causing marked narrowing of the trachea with a short axis diameter of 6 mm. This is compatible with a multinodular goiter. 5. Calcific coronary artery and aortic atherosclerosis. 6. Possible early changes of cirrhosis of the liver. Aortic Atherosclerosis (ICD10-I70.0).  . ROS-see HPI   + = Positive Constitutional:    +weight loss, night sweats, fevers, chills, fatigue, lassitude. HEENT:   + headaches, difficulty swallowing, tooth/dental problems, sore throat,       sneezing, itching, ear ache, + nasal congestion, post nasal drip, snoring CV:    chest pain, orthopnea, PND, swelling in lower extremities, anasarca,                                                  dizziness, palpitations Resp:   + shortness of breath with exertion or at rest.                productive cough,   non-productive cough, coughing up of blood.              change in color of mucus.  + wheezing.   Skin:    rash or lesions. GI:  No-   heartburn, indigestion, abdominal pain, nausea, vomiting, diarrhea,                 change in bowel habits, loss of appetite GU: dysuria, change in color of urine,  no urgency or frequency.   flank pain. MS:   joint pain, stiffness, decreased range of motion, back pain. Neuro-     nothing unusual Psych:  change in mood or affect.  depression or anxiety.   memory loss.  OBJ- Physical Exam General- Alert, Oriented, Affect-appropriate, Distress- none acute, + morbidly obese Skin- rash-none, lesions- none, excoriation-  none Lymphadenopathy- none Head- atraumatic,             Eyes- Gross vision intact, PERRLA, conjunctivae and secretions clear            Ears- Hearing, canals-normal            Nose-  turbinate edema, no-Septal dev, mucus, polyps, erosion, perforation,            Throat- Mallampati III , mucosa clear , drainage- none, tonsils- atrophic, + stridor Neck- flexible , trachea midline , thyroid R mass identified as" goiter" , carotid no bruit Chest - symmetrical excursion , unlabored           Heart/CV- RRR , no murmur , no gallop  , no rub, nl s1 s2                           - JVD- none , edema- none, stasis changes- none, varices- none           Lung- clear,  wheeze- none, cough- none , dullness-none, rub- none           Chest wall-  Abd-  Br/ Gen/ Rectal- Not done, not indicated Extrem- cyanosis- none, clubbing, none, atrophy- none, strength- nl Neuro- grossly intact to observation

## 2020-09-18 ENCOUNTER — Ambulatory Visit: Payer: Medicare Other | Admitting: Internal Medicine

## 2020-09-20 DIAGNOSIS — J310 Chronic rhinitis: Secondary | ICD-10-CM | POA: Diagnosis not present

## 2020-09-20 DIAGNOSIS — E890 Postprocedural hypothyroidism: Secondary | ICD-10-CM | POA: Diagnosis not present

## 2020-09-29 ENCOUNTER — Other Ambulatory Visit: Payer: Self-pay | Admitting: Family Medicine

## 2020-10-17 DIAGNOSIS — E119 Type 2 diabetes mellitus without complications: Secondary | ICD-10-CM | POA: Diagnosis not present

## 2020-10-17 LAB — HM DIABETES EYE EXAM

## 2020-10-20 ENCOUNTER — Other Ambulatory Visit (HOSPITAL_COMMUNITY): Payer: Medicare Other

## 2020-10-20 NOTE — Progress Notes (Deleted)
HPI female never smoker followed for OSA, lung restriction, dyspnea on exertion, asthmatic bronchitis, complicated by chronic sinusitis, anxiety and depression, paroxysmal atrial tachycardia, CAD, breast cancer left, morbid obesity Unattended Home Sleep Test 11/05/13-AHI 68.1/hour, desaturation to 62%, body weight 320 pounds Echocardiogram 12/04/16- Nl EF Office Spirometry 02/06/17-moderate restriction of exhaled volume. FVC 2.02/61%, FEV1 1.75/69%, ratio 0.86, FEF 25-75% 2.45/113% PFT- 03/12/17-moderate obstructive airways disease with significant response to bronchodilator, normal diffusion. FVC 2.25/69%, FEV1 1.88/76%, ratio 0.84, TLC 80%, DLCO 86% --------------------------------------------------------------------------------------------.  06/19/20- 70 year old female never smoker followed for OSA, lung restriction, dyspnea on exertion, asthmatic bronchitis, complicated by chronic sinusitis, anxiety and depression, paroxysmal atrial tachycardia, CAD, breast cancer left, morbid obesity, Goiter, CSF leak treated at [facility] with ENT f/u at [facility],  CPAP auto 5-15/Adapt Albuterol hfa, Breo 100, singulair  Download- 97%, AHI 2.1/ hr Body weight today- 312 lbs Covid vax-3 Moderna Flu vax- had Recently able to resume CPAP use after sgy for CSF leak. Considers CPAP a big help for sleep.  Asthma- . Notes DOE- ok at rest- since surgery in June. Some wheeze- rescue hfa does help. L leg swells. No chest pain.No hx VTE. Sees Dr Turner./ cardiology. Hx chemo and XRT L breast.   10/23/20-  70 year old female never smoker followed for OSA, lung restriction, dyspnea on exertion, asthmatic bronchitis, complicated by chronic sinusitis, anxiety and depression, paroxysmal atrial tachycardia, CAD, breast cancer left, morbid obesity, Goiter, CSF leak treated at [facility] with ENT f/u at [facility],  CPAP auto 5-15/Adapt -Albuterol hfa, Breo 100, singulair  Download- Body weight today- Covid vax-  . ROS-see HPI    + = Positive Constitutional:    +weight loss, night sweats, fevers, chills, fatigue, lassitude. HEENT:   + headaches, difficulty swallowing, tooth/dental problems, sore throat,       sneezing, itching, ear ache, + nasal congestion, post nasal drip, snoring CV:    chest pain, orthopnea, PND, swelling in lower extremities, anasarca,                                                  dizziness, palpitations Resp:   + shortness of breath with exertion or at rest.                productive cough,   non-productive cough, coughing up of blood.              change in color of mucus.  + wheezing.   Skin:    rash or lesions. GI:  No-   heartburn, indigestion, abdominal pain, nausea, vomiting, diarrhea,                 change in bowel habits, loss of appetite GU: dysuria, change in color of urine, no urgency or frequency.   flank pain. MS:   joint pain, stiffness, decreased range of motion, back pain. Neuro-     nothing unusual Psych:  change in mood or affect.  depression or anxiety.   memory loss.  OBJ- Physical Exam General- Alert, Oriented, Affect-appropriate, Distress- none acute, + morbidly obese Skin- rash-none, lesions- none, excoriation- none Lymphadenopathy- none Head- atraumatic,             Eyes- Gross vision intact, PERRLA, conjunctivae and secretions clear            Ears- Hearing, canals-normal  Nose-  turbinate edema, no-Septal dev, mucus, polyps, erosion, perforation,            Throat- Mallampati III , mucosa clear , drainage- none, tonsils- atrophic, + stridor Neck- flexible , trachea midline , thyroid R mass identified as" goiter" , carotid no bruit Chest - symmetrical excursion , unlabored           Heart/CV- RRR , no murmur , no gallop  , no rub, nl s1 s2                           - JVD- none , edema- none, stasis changes- none, varices- none           Lung- clear,  wheeze- none, cough- none , dullness-none, rub- none           Chest wall-  Abd-  Br/ Gen/ Rectal-  Not done, not indicated Extrem- cyanosis- none, clubbing, none, atrophy- none, strength- nl Neuro- grossly intact to observation

## 2020-10-23 ENCOUNTER — Encounter: Payer: Self-pay | Admitting: *Deleted

## 2020-10-23 ENCOUNTER — Ambulatory Visit: Payer: Medicare Other | Admitting: Internal Medicine

## 2020-10-23 ENCOUNTER — Other Ambulatory Visit: Payer: Self-pay | Admitting: Family

## 2020-10-23 ENCOUNTER — Other Ambulatory Visit: Payer: Self-pay | Admitting: Family Medicine

## 2020-11-01 DIAGNOSIS — H04123 Dry eye syndrome of bilateral lacrimal glands: Secondary | ICD-10-CM | POA: Diagnosis not present

## 2020-11-01 DIAGNOSIS — G4733 Obstructive sleep apnea (adult) (pediatric): Secondary | ICD-10-CM | POA: Diagnosis not present

## 2020-11-13 ENCOUNTER — Other Ambulatory Visit: Payer: Self-pay | Admitting: Family Medicine

## 2020-11-14 ENCOUNTER — Other Ambulatory Visit: Payer: Self-pay | Admitting: *Deleted

## 2020-11-14 NOTE — Patient Outreach (Signed)
[facility]) Care Management  11/14/2020  [name] May 10, 1951 [id number]  Unsuccessful outreach attempt made to patient. Patient answered the phone and stated that she would not be able to speak today. She did request that this nurse call back at a later date.   Plan: RN Health Coach will call patient within the month of June.  Emelia Loron RN, BSN [place] [phone] [email or web address]

## 2020-11-24 ENCOUNTER — Other Ambulatory Visit: Payer: Self-pay | Admitting: Family Medicine

## 2020-11-24 DIAGNOSIS — E559 Vitamin D deficiency, unspecified: Secondary | ICD-10-CM

## 2020-11-30 ENCOUNTER — Other Ambulatory Visit: Payer: Self-pay | Admitting: *Deleted

## 2020-11-30 NOTE — Patient Instructions (Signed)
Goals Addressed               This Visit's Progress     Patient Stated     [facility]) Increase physical activity (pt-stated)        Timeframe:  Long-Range Goal Priority:  High Start Date: 08/28/20                            Expected End Date: 09/20/21  -Discussed beginning by doing chair exercises -Discussed doing chair exercises 2 times a week and continuation of maintaining her home  Notes: patient states she will start doing chair exercises with a goal of eventually walking to increase her physical activity. Nurse will send patient chair exercise printouts.                      Updated 11/30/20: Patient states that she did receive the chair exercise printouts and does them occasionally. Patient states she will begin to do the chair exercises 2 times a week and continue to do her house-hold chores to maintain her home.         Weight (lb) < 300 lb (136.1 kg) (pt-stated)        Timeframe:  Long-Range Goal Priority:  High Start Date:  08/28/20                           Expected End Date: 09/20/21     Follow Up Date: 03/23/21  -Encourage patient to set obtainable goal -Discussed going back to [facility] -Discussed reducing calories and portion size -  Encouraged patient to monitor carbohydrates and sugar -Discussed eating healthier at night and reducing portion size at night -Discussed doing chair exercises 2 times weekly as well as maintaining her home  Notes: Patient states she wants to lose weight for her overall health. Patient explained she was doing well with the [place] [facility] program and plans to go back once she recovers from her surgery. Nurse will send a planning healthy meals booklet and chair exercise printouts.     Updated 11/30/20: Patient states that she has begun to cook more at home and she did receive the planning healthy meals booklet. Patient identified that she eats more at night. She will work towards eating healthier at night and reducing  her portion size. Patient plans to discuss restarting [place] [facility] with Dr. Charlett Blake during her upcoming visit on 12/05/20.                 Other     [facility]-Monitor and Manage My Blood Sugar-Diabetes Type 2        Timeframe:  Long-Range Goal Priority:  High Start Date:   07/04/20                          Expected End Date:  05/23/22                   Follow Up Date 12/21/20   - check blood sugar at prescribed times - take the blood sugar log to all doctor visits   -Encouraged patient to limit sugar and carbohydrates in her diet -Discussed increasing her physical activity to chair exercises 2 times a week and to continue to maintain her home  Why is this important?   Checking your blood sugar at home helps to  keep it from getting very high or very low.  Writing the results in a diary or log helps the doctor know how to care for you.  Your blood sugar log should have the time, date and the results.  Also, write down the amount of insulin or other medicine that you take.  Other information, like what you ate, exercise done and how you were feeling, will also be helpful.     Notes: Updated 08/28/20: Patient reports taking her blood sugar twice daily. She explains that her blood sugar ranges have improved since her discharge from the hospital with ranges of 100-130's.    Updated 11/30/20: Patient states that she continues to take her blood sugar daily. Her ranges have maintained in the 100's. Patient explains that she is working towards eating healthier, increasing her physical activity, and losing weight to improve her health and wellness.

## 2020-11-30 NOTE — Patient Outreach (Signed)
[facility]) Care Management  [place]  11/30/2020   [name] 12/10/1950 [id number]  Subjective: Successful telephone outreach call to patient. HIPAA identifiers obtained. Patient states that she continues to take her blood sugar daily. Her ranges have maintained in the 100's and has not exceeded 200. Patient explains that she is working towards eating healthier, increasing her physical activity, and losing weight to improve her health and wellness. Patient states she would like to lower her A1c to 7 or below. Her current A1c is 9.5. She is motivated to lose weight and states that she did receive the planning healthy meals booklet this nurse sent previously. Patient states that she will begin to do chair exercise 2 times a week and will work towards eating healthier at night and reducing her portion size. Patient plans to discuss restarting [facility] with Dr. Charlett Blake during her upcoming visit on 12/05/20. Patient is well supported by her family and did not have any further questions or concerns today. She did confirm that she has this nurse's contact number to call her if needed.   Encounter Medications:  Outpatient Encounter Medications as of 11/30/2020  Medication Sig   acetaminophen (TYLENOL) 500 MG tablet Take 1,000 mg by mouth 2 (two) times daily as needed for moderate pain or headache.   albuterol (VENTOLIN HFA) 108 (90 Base) MCG/ACT inhaler Inhale 2 puffs every 4-6 hours as needed - rescue.   anastrozole (ARIMIDEX) 1 MG tablet Take 1 tablet (1 mg total) by mouth daily.   busPIRone (BUSPAR) 7.5 MG tablet Take 1 tablet (7.5 mg total) by mouth 3 (three) times daily.   carvedilol (COREG) 12.5 MG tablet Take 1 tablet 2 times daily with a meal.   diclofenac (VOLTAREN) 75 MG EC tablet TAKE 1 TABLET 2 TIMES A DAY   diltiazem (CARDIZEM CD) 120 MG 24 hr capsule Take 1 capsule (120 mg total) by mouth daily.   Docusate Calcium (STOOL SOFTENER PO) Take  by mouth daily.   escitalopram (LEXAPRO) 20 MG tablet TAKE 1 TABLET EVERY MORNING AND 1/2 EVERY EVENING   esomeprazole (NEXIUM) 40 MG capsule TAKE 1 CAPSULE ONCE DAILY   ezetimibe (ZETIA) 10 MG tablet Take 1 tablet (10 mg total) by mouth daily.   fenofibrate (TRICOR) 145 MG tablet TAKE ONE TABLET ONCE DAILY   fluticasone (FLONASE) 50 MCG/ACT nasal spray Place 2 sprays into both nostrils daily.   fluticasone furoate-vilanterol (BREO ELLIPTA) 100-25 MCG/INH AEPB Inhale 1 puff then rinse mouth, once daily   furosemide (LASIX) 20 MG tablet Take 1 tablet (20 mg total) by mouth daily.   glucose blood (ACCU-CHEK AVIVA) test strip Twice daily (please use brand that patient insurance covers)   Hypromellose (ARTIFICIAL TEARS OP) Place 1 drop into both eyes daily as needed (dry eyes).    levothyroxine (SYNTHROID) 175 MCG tablet TAKE 1 TABLET DAILY   liraglutide (VICTOZA) 18 MG/3ML SOPN Inject 1.8 mg into the skin daily.   loratadine (CLARITIN) 10 MG tablet Take 1 tablet (10 mg total) by mouth daily.   losartan (COZAAR) 100 MG tablet Take 1 tablet (100 mg total) by mouth daily.   meclizine (ANTIVERT) 25 MG tablet Take 25 mg by mouth every 6 (six) hours as needed.   melatonin 5 MG TABS Take by mouth.   metFORMIN (GLUCOPHAGE) 500 MG tablet TAKE 2 TABLETS 2 TIMES A DAY WITH MEALS   montelukast (SINGULAIR) 10 MG tablet Take 1 tablet (10 mg total) by mouth at  bedtime.   rosuvastatin (CRESTOR) 40 MG tablet Take 1 tablet (40 mg total) by mouth daily.   Vitamin D, Ergocalciferol, (DRISDOL) 1.25 MG (50000 UNIT) CAPS capsule Take 1 capsule (50,000 Units total) by mouth every 7 (seven) days.   levothyroxine (SYNTHROID) 150 MCG tablet Take 1 tablet (150 mcg total) by mouth daily before breakfast.   liraglutide (VICTOZA) 18 MG/3ML SOPN Start 0.6mg  SQ once a day for 7 days, then increase to 1.2mg  SQ once a day x 7 days and then 1.8 mg SQ daily (Patient not taking: Reported on 11/30/2020)   No facility-administered  encounter medications on file as of 11/30/2020.    Functional Status:  In your present state of health, do you have any difficulty performing the following activities: 07/04/2020  Hearing? N  Vision? N  Difficulty concentrating or making decisions? N  Walking or climbing stairs? Y  Comment knee problems  Dressing or bathing? N  Doing errands, shopping? N  Preparing Food and eating ? N  Using the Toilet? N  In the past six months, have you accidently leaked urine? Y  Comment some incontinence at times  Do you have problems with loss of bowel control? N  Managing your Medications? N  Managing your Finances? N  Housekeeping or managing your Housekeeping? N  Some recent data might be hidden    Fall/Depression Screening: Fall Risk  08/28/2020 07/04/2020 10/18/2019  Falls in the past year? 1 0 0  Number falls in past yr: 0 0 -  Injury with Fall? 0 0 -  Comment - - -  Risk for fall due to : Impaired balance/gait;History of fall(s);Impaired mobility - -  Risk for fall due to: Comment - - -  Follow up Education provided;Falls evaluation completed;Falls prevention discussed - -   PHQ 2/9 Scores 08/28/2020 07/04/2020 10/18/2019 09/23/2019 09/06/2019 04/02/2019 09/18/2018  PHQ - 2 Score 1 0 0 0 2 0 0  PHQ- 9 Score - - - - 9 - -  Exception Documentation - - - - - - -    Assessment:   Care Plan There are no care plans that you recently modified to display for this patient.    Goals Addressed               This Visit's Progress     Patient Stated     [facility]) Increase physical activity (pt-stated)        Timeframe:  Long-Range Goal Priority:  High Start Date: 08/28/20                            Expected End Date: 09/20/21  -Discussed beginning by doing chair exercises -Discussed doing chair exercises 2 times a week and continuation of maintaining her home  Notes: patient states she will start doing chair exercises with a goal of eventually walking to increase her physical activity. Nurse  will send patient chair exercise printouts.                      Updated 11/30/20: Patient states that she did receive the chair exercise printouts and does them occasionally. Patient states she will begin to do the chair exercises 2 times a week and continue to do her house-hold chores to maintain her home.         Weight (lb) < 300 lb (136.1 kg) (pt-stated)        Timeframe:  Long-Range Goal Priority:  High  Start Date:  08/28/20                           Expected End Date: 09/20/21     Follow Up Date: 03/23/21  -Encourage patient to set obtainable goal -Discussed going back to [facility] -Discussed reducing calories and portion size -  Encouraged patient to monitor carbohydrates and sugar -Discussed eating healthier at night and reducing portion size at night -Discussed doing chair exercises 2 times weekly as well as maintaining her home  Notes: Patient states she wants to lose weight for her overall health. Patient explained she was doing well with the [place] [facility] and plans to go back once she recovers from her surgery. Nurse will send a planning healthy meals booklet and chair exercise printouts.     Updated 11/30/20: Patient states that she has begun to cook more at home and she did receive the planning healthy meals booklet. Patient identified that she eats more at night. She will work towards eating healthier at night and reducing her portion size. Patient plans to discuss restarting [facility] with Dr. Charlett Blake during her upcoming visit on 12/05/20.                 Other     THN-Monitor and Manage My Blood Sugar-Diabetes Type 2        Timeframe:  Long-Range Goal Priority:  High Start Date:   07/04/20                          Expected End Date:  05/23/22                   Follow Up Date 12/21/20   - check blood sugar at prescribed times - take the blood sugar log to all doctor visits   -Encouraged patient to limit sugar and  carbohydrates in her diet -Discussed increasing her physical activity to chair exercises 2 times a week and to continue to maintain her home  Why is this important?   Checking your blood sugar at home helps to keep it from getting very high or very low.  Writing the results in a diary or log helps the doctor know how to care for you.  Your blood sugar log should have the time, date and the results.  Also, write down the amount of insulin or other medicine that you take.  Other information, like what you ate, exercise done and how you were feeling, will also be helpful.     Notes: Updated 08/28/20: Patient reports taking her blood sugar twice daily. She explains that her blood sugar ranges have improved since her discharge from the hospital with ranges of 100-130's.    Updated 11/30/20: Patient states that she continues to take her blood sugar daily. Her ranges have maintained in the 100's. Patient explains that she is working towards eating healthier, increasing her physical activity, and losing weight to improve her health and wellness.         Plan: RN Health Coach will send PCP a quarterly update and will call patient within the month of September. Follow-up: Patient agrees to Care Plan and Follow-up.  Emelia Loron RN, BSN [place] [phone] [email or web address]

## 2020-12-05 ENCOUNTER — Other Ambulatory Visit: Payer: Self-pay

## 2020-12-05 ENCOUNTER — Ambulatory Visit (INDEPENDENT_AMBULATORY_CARE_PROVIDER_SITE_OTHER): Payer: Medicare Other | Admitting: Family Medicine

## 2020-12-05 VITALS — BP 110/68 | HR 89 | Temp 98.4°F | Resp 18 | Wt 318.8 lb

## 2020-12-05 DIAGNOSIS — E890 Postprocedural hypothyroidism: Secondary | ICD-10-CM | POA: Diagnosis not present

## 2020-12-05 DIAGNOSIS — D508 Other iron deficiency anemias: Secondary | ICD-10-CM | POA: Diagnosis not present

## 2020-12-05 DIAGNOSIS — I5032 Chronic diastolic (congestive) heart failure: Secondary | ICD-10-CM

## 2020-12-05 DIAGNOSIS — E7800 Pure hypercholesterolemia, unspecified: Secondary | ICD-10-CM | POA: Diagnosis not present

## 2020-12-05 DIAGNOSIS — R110 Nausea: Secondary | ICD-10-CM

## 2020-12-05 DIAGNOSIS — M7918 Myalgia, other site: Secondary | ICD-10-CM

## 2020-12-05 DIAGNOSIS — E559 Vitamin D deficiency, unspecified: Secondary | ICD-10-CM

## 2020-12-05 DIAGNOSIS — E1165 Type 2 diabetes mellitus with hyperglycemia: Secondary | ICD-10-CM | POA: Diagnosis not present

## 2020-12-05 DIAGNOSIS — R252 Cramp and spasm: Secondary | ICD-10-CM | POA: Diagnosis not present

## 2020-12-05 DIAGNOSIS — Z9889 Other specified postprocedural states: Secondary | ICD-10-CM

## 2020-12-05 DIAGNOSIS — E66812 Obesity, class 2: Secondary | ICD-10-CM

## 2020-12-05 DIAGNOSIS — I10 Essential (primary) hypertension: Secondary | ICD-10-CM

## 2020-12-05 MED ORDER — GLIMEPIRIDE 1 MG PO TABS: 1.0000 mg | ORAL_TABLET | Freq: Every day | ORAL | 3 refills | Status: DC

## 2020-12-05 MED ORDER — WEGOVY 0.25 MG/0.5ML ~~LOC~~ SOAJ: 0.2500 mg | SUBCUTANEOUS | 0 refills | Status: DC

## 2020-12-05 NOTE — Progress Notes (Signed)
[name], female    DOB: 12-08-1950  Age: 70 y.o. MRN: [id number]    Subjective:  Subjective  HPI [name] presents for office visit today for follow up weight management medications and hypertension. She expresses regarding Victoza causing nausea and muscle cramps. She states that she even experiences vertigo in the morning. She states that she does not feel hungry until 12-1 at noon, then her appetite would increase and reports struggling to control eating due to food cravings. She states if she avoids eating, she experiences more nausea. She reports that she started experiencing those side effects from Victoza when her dosage was increased to 1.8 MG. She states that initially when she has had the surgery she felt better, but at the moment she feels worse. She states that on her previous weight loss medication she was doing a lot better, but due to insurance trouble she was forced to switch to Victoza. She denies any chest pain, SOB, fever, abdominal pain, cough, chills, sore throat, dysuria, urinary incontinence, back pain, HA, or V/D.   She reports experiencing left leg cramps that wake her up in the middle of the night. She states that she has to walk during the night to relieve the cramps. She reports that it occurs at least 3 times a week during nighttime. She states that it might be due to her Victoza medication or due to dehydration.  She states that she feels post-surgery discomfort in her throat, which she is getting treated with nasal irrigation. She reports that the treated has helped her throat feels slightly better.  She states that her current sugar levels are not that good and reports having glucose levels of 180-222. She states that her glucose levels in the morning are always on higher range.   Review of Systems  Constitutional:  Negative for chills, fatigue and fever.  HENT:  Negative for congestion, rhinorrhea, sinus pressure, sinus pain and sore  throat.   Eyes:  Negative for pain.  Respiratory:  Negative for cough and shortness of breath.   Cardiovascular:  Negative for chest pain, palpitations and leg swelling.  Gastrointestinal:  Positive for nausea. Negative for abdominal pain, blood in stool, diarrhea and vomiting.  Genitourinary:  Negative for decreased urine volume, flank pain, frequency, vaginal bleeding and vaginal discharge.  Musculoskeletal:  Negative for back pain.       (+) left leg cramps occurring at nighttime  Neurological:  Positive for dizziness. Negative for headaches.  Psychiatric/Behavioral:  Positive for sleep disturbance (secondary to LLE cramps).    History Past Medical History:  Diagnosis Date   Anemia 04/05/2012   Anxiety    Anxiety state 09/11/2007   Qualifier: Diagnosis of  By: Sherren Mocha RN, Dorian Pod     Asthma    Atrial tachycardia, paroxysmal ([place])    Back pain 10/02/2014   Breast cancer ([place]) 02/17/2017   CAD (coronary artery disease), native coronary artery    remote Cath with nonobstructive ASCAD with 20% mid LAD, 20% prox left circ and 20% ostial RCA   Chicken pox as a child   Chronic diastolic CHF (congestive heart failure), NYHA class 1 ([place])    Complication of anesthesia    pt states feels different in her body after anesthesia when waking up and also experiences a smell of burnt plastic for approx a wk    Constipation    Depression    Diabetes ([place])    Dilated aortic root (HCC)    64mm  by echo 08/2020   Dysuria 02/17/2017   Elevated LFTs 04/05/2012   GERD (gastroesophageal reflux disease)    Goiter    Heart murmur    hx of one at birth    History of kidney stones    History of radiation therapy 10/31/16-12/17/16   left breast 45 Gy in 25 fractions, left breast boost 16 Gy in 8 fractions   Hx of colonic polyps    Hyperlipidemia    Hypertension    Insomnia 10/08/2016   Joint pain    Malignant neoplasm of upper-outer quadrant of left female breast ([place]) 05/14/2016   not with patient    Measles as a child   Mumps as a child   OA (osteoarthritis) of knee 04/05/2012   Obesity 07/11/2014   PONV (postoperative nausea and vomiting)    Preventative health care 09/05/2013   PVC's (premature ventricular contractions) 05/02/2016   Shortness of breath dyspnea    walking distances / climbing stairs   Sleep apnea 04/05/2012   Tinea corporis 02/23/2013   Type 2 diabetes mellitus with hyperglycemia ([place]) 12/31/2013   Vasomotor rhinitis 04/05/2012   Ventral hernia    Wheezing     She has a past surgical history that includes Cholecystectomy; Meniscus repair (2009); Total knee arthroplasty (2011); Knee arthroscopy (05/2010); Mouth surgery; Hernia repair (02/08/11); Cesarean section; Wisdom tooth extraction (2000); Tubal ligation; Cardiovascular stress test; Total knee arthroplasty (Right, 05/10/2014); left and right heart catheterization with coronary angiogram (N/A, 11/08/2013); Joint replacement; Colonoscopy with propofol (N/A, 03/28/2015); Cardiac catheterization; Breast lumpectomy with radioactive seed and sentinel lymph node biopsy (Left, 06/21/2016); Portacath placement (Right, 07/16/2016); Port-a-cath removal (N/A, 01/24/2017); Breast lumpectomy (Left, 2017); and Repair dural / CSF leak (2021).   Her family history includes Alcohol abuse in her paternal grandfather; Anxiety disorder in her mother; Arthritis in her father; Cancer in her maternal aunt and maternal grandmother; Coronary artery disease in her brother and father; Depression in her mother; Heart attack in her maternal grandfather; Heart attack (age of onset: 26) in her father; Heart disease in her brother; High Cholesterol in her father; Hyperlipidemia in her mother; Hypertension in her father; Thyroid disease in her mother.She reports that she has never smoked. She has never used smokeless tobacco. She reports previous alcohol use. She reports that she does not use drugs.  Current Outpatient Medications on File Prior to Visit   Medication Sig Dispense Refill   acetaminophen (TYLENOL) 500 MG tablet Take 1,000 mg by mouth 2 (two) times daily as needed for moderate pain or headache.     albuterol (VENTOLIN HFA) 108 (90 Base) MCG/ACT inhaler Inhale 2 puffs every 4-6 hours as needed - rescue. 18 g 12   anastrozole (ARIMIDEX) 1 MG tablet Take 1 tablet (1 mg total) by mouth daily. 90 tablet 3   busPIRone (BUSPAR) 7.5 MG tablet Take 1 tablet (7.5 mg total) by mouth 3 (three) times daily. 90 tablet 3   carvedilol (COREG) 12.5 MG tablet Take 1 tablet 2 times daily with a meal. 60 tablet 2   diclofenac (VOLTAREN) 75 MG EC tablet TAKE 1 TABLET 2 TIMES A DAY 180 tablet 0   diltiazem (CARDIZEM CD) 120 MG 24 hr capsule Take 1 capsule (120 mg total) by mouth daily. 90 capsule 1   Docusate Calcium (STOOL SOFTENER PO) Take by mouth daily.     escitalopram (LEXAPRO) 20 MG tablet TAKE 1 TABLET EVERY MORNING AND 1/2 EVERY EVENING 135 tablet 1   esomeprazole (NEXIUM) 40 MG  capsule TAKE 1 CAPSULE ONCE DAILY 90 capsule 3   ezetimibe (ZETIA) 10 MG tablet Take 1 tablet (10 mg total) by mouth daily. 90 tablet 3   fenofibrate (TRICOR) 145 MG tablet TAKE ONE TABLET ONCE DAILY 30 tablet 3   fluticasone (FLONASE) 50 MCG/ACT nasal spray Place 2 sprays into both nostrils daily. 16 g 6   fluticasone furoate-vilanterol (BREO ELLIPTA) 100-25 MCG/INH AEPB Inhale 1 puff then rinse mouth, once daily 60 each 12   furosemide (LASIX) 20 MG tablet Take 1 tablet (20 mg total) by mouth daily. 90 tablet 1   glucose blood (ACCU-CHEK AVIVA) test strip Twice daily (please use brand that patient insurance covers) 100 each 5   Hypromellose (ARTIFICIAL TEARS OP) Place 1 drop into both eyes daily as needed (dry eyes).      levothyroxine (SYNTHROID) 175 MCG tablet TAKE 1 TABLET DAILY 30 tablet 3   liraglutide (VICTOZA) 18 MG/3ML SOPN Start 0.6mg  SQ once a day for 7 days, then increase to 1.2mg  SQ once a day x 7 days and then 1.8 mg SQ daily 6 mL 0   loratadine (CLARITIN)  10 MG tablet Take 1 tablet (10 mg total) by mouth daily. 30 tablet 11   losartan (COZAAR) 100 MG tablet Take 1 tablet (100 mg total) by mouth daily. 90 tablet 1   meclizine (ANTIVERT) 25 MG tablet Take 25 mg by mouth every 6 (six) hours as needed.     melatonin 5 MG TABS Take by mouth.     metFORMIN (GLUCOPHAGE) 500 MG tablet TAKE 2 TABLETS 2 TIMES A DAY WITH MEALS 360 tablet 0   montelukast (SINGULAIR) 10 MG tablet Take 1 tablet (10 mg total) by mouth at bedtime. 90 tablet 1   rosuvastatin (CRESTOR) 40 MG tablet Take 1 tablet (40 mg total) by mouth daily. 90 tablet 1   Vitamin D, Ergocalciferol, (DRISDOL) 1.25 MG (50000 UNIT) CAPS capsule Take 1 capsule (50,000 Units total) by mouth every 7 (seven) days. 4 capsule 4   No current facility-administered medications on file prior to visit.     Objective:  Objective  Physical Exam BP 110/68   Pulse 89   Temp 98.4 F (36.9 C)   Resp 18   Wt (!) 318 lb 12.8 oz (144.6 kg)   SpO2 94%   BMI 51.46 kg/m  Wt Readings from Last 3 Encounters:  12/05/20 (!) 318 lb 12.8 oz (144.6 kg)  07/17/20 (!) 311 lb 9.6 oz (141.3 kg)  06/19/20 (!) 312 lb 6.4 oz (141.7 kg)     Lab Results  Component Value Date   WBC 8.6 12/06/2020   HGB 11.2 (L) 12/06/2020   HCT 36.6 12/06/2020   PLT 298.0 12/06/2020   GLUCOSE 235 (H) 12/06/2020   CHOL 109 12/06/2020   TRIG 266.0 (H) 12/06/2020   HDL 31.40 (L) 12/06/2020   LDLDIRECT 50.0 12/06/2020   LDLCALC 67 06/02/2020   ALT 49 (H) 12/06/2020   AST 44 (H) 12/06/2020   NA 135 12/06/2020   K 4.6 12/06/2020   CL 95 (L) 12/06/2020   CREATININE 0.99 12/06/2020   BUN 22 12/06/2020   CO2 25 12/06/2020   TSH 29.57 (H) 12/06/2020   INR 1.01 05/03/2014   HGBA1C 9.7 (H) 12/06/2020    MM 3D SCREEN BREAST BILATERAL  Result Date: 08/14/2020 CLINICAL DATA:  Screening. EXAM: DIGITAL SCREENING BILATERAL MAMMOGRAM WITH TOMOSYNTHESIS AND CAD TECHNIQUE: Bilateral screening digital craniocaudal and mediolateral oblique  mammograms were obtained. Bilateral screening digital  breast tomosynthesis was performed. The images were evaluated with computer-aided detection. COMPARISON:  Previous exam(s). ACR Breast Density Category b: There are scattered areas of fibroglandular density. FINDINGS: There are no findings suspicious for malignancy. IMPRESSION: No mammographic evidence of malignancy. A result letter of this screening mammogram will be mailed directly to the patient. RECOMMENDATION: Screening mammogram in one year. (Code:SM-B-01Y) BI-RADS CATEGORY  1: Negative. Electronically Signed   By: Abelardo Diesel M.D.   On: 08/14/2020 14:46     Assessment & Plan:  Plan    Meds ordered this encounter  Medications   Semaglutide-Weight Management (WEGOVY) 0.25 MG/0.5ML SOAJ    Sig: Inject 0.25 mg into the skin once a week.    Dispense:  2 mL    Refill:  0   glimepiride (AMARYL) 1 MG tablet    Sig: Take 1 tablet (1 mg total) by mouth daily with breakfast.    Dispense:  30 tablet    Refill:  3    Problem List Items Addressed This Visit     Hyperlipidemia    Tolerating statin, encouraged heart healthy diet, avoid trans fats, minimize simple carbs and saturated fats. Increase exercise as tolerated       Relevant Orders   Lipid panel (Completed)   Essential hypertension - Primary    Well controlled, no changes to meds. Encouraged heart healthy diet such as the DASH diet and exercise as tolerated.        Relevant Orders   CBC (Completed)   Comprehensive metabolic panel (Completed)   TSH (Completed)   Anemia   Musculoskeletal pain   Relevant Orders   Magnesium (Completed)   Chronic diastolic CHF (congestive heart failure) ([place])   Type 2 diabetes mellitus with hyperglycemia ([place])    Poorly controlled. Add Glimepiride 2 mg po bid. minimize simple carbs. Increase exercise as tolerated. Continue current meds       Relevant Medications   glimepiride (AMARYL) 1 MG tablet   Other Relevant Orders   Hemoglobin  A1c (Completed)   Obesity    Is startedon Wegovy 0.25 mg IM weekly x 4 weeks and then titrate as tolerated.        Relevant Medications   Semaglutide-Weight Management (WEGOVY) 0.25 MG/0.5ML SOAJ   glimepiride (AMARYL) 1 MG tablet   Vitamin D deficiency    Supplement and monitor       Relevant Orders   VITAMIN D 25 Hydroxy (Vit-D Deficiency, Fractures) (Completed)   Uncontrolled type 2 diabetes mellitus with hyperglycemia (HCC)   Relevant Medications   glimepiride (AMARYL) 1 MG tablet   H/O thyroidectomy    TSH significantly elevated. Will increase Levothyroxine to 200 mcg daily and reevaluate.        Nausea    She notes it has been bad and keeping her form eating in the morning for roughly the past 6 weeks which correlates pretty close to her Victoza dose increasing to 1.8, will drop her to 1.2 and try and switch her to [facility] weekly if her insurance will cover it.       Muscle cramps    Hydrate and monitor        Follow-up: Return in about 3 months (around 03/08/2021).

## 2020-12-05 NOTE — Patient Instructions (Addendum)
Drop the Victoza to 1.2 then 0.6 as needed for nausea If still nauseous and muscle cramps then drop the fenofibrate  Hydrate   Nausea, Adult Nausea is the feeling that you have an upset stomach or that you are about to vomit. Nausea on its own is not usually a serious concern, but it may be an early sign of a more serious medical problem. As nausea gets worse, it can lead to vomiting. If vomiting develops, or if you are not able to drink enough fluids, you are at risk of becoming dehydrated. Dehydration can make you tired and thirsty, cause you to have a dry mouth, and decrease how often you urinate. Older adults and people with other diseases or a weak disease-fighting system (immune system) are at higher risk for dehydration. The main goals of treating your nausea are: To relieve your nausea. To limit repeated nausea episodes. To prevent vomiting and dehydration. Follow these instructions at home: Watch your symptoms for any changes. Tell your health care provider about them.Follow these instructions as told by your health care provider. Eating and drinking     Take an oral rehydration solution (ORS). This is a drink that is sold at pharmacies and retail stores. Drink clear fluids slowly and in small amounts as you are able. Clear fluids include water, ice chips, low-calorie sports drinks, and fruit juice that has water added (diluted fruit juice). Eat bland, easy-to-digest foods in small amounts as you are able. These foods include bananas, applesauce, rice, lean meats, toast, and crackers. Avoid drinking fluids that contain a lot of sugar or caffeine, such as energy drinks, sports drinks, and soda. Avoid alcohol. Avoid spicy or fatty foods. General instructions Take over-the-counter and prescription medicines only as told by your health care provider. Rest at home while you recover. Drink enough fluid to keep your urine pale yellow. Breathe slowly and deeply when you feel  nauseous. Avoid smelling things that have strong odors. Wash your hands often using soap and water. If soap and water are not available, use hand sanitizer. Make sure that all people in your household wash their hands well and often. Keep all follow-up visits as told by your health care provider. This is important. Contact a health care provider if: Your nausea gets worse. Your nausea does not go away after two days. You vomit. You cannot drink fluids without vomiting. You have any of the following: New symptoms. A fever. A headache. Muscle cramps. A rash. Pain while urinating. You feel light-headed or dizzy. Get help right away if: You have pain in your chest, neck, arm, or jaw. You feel extremely weak or you faint. You have vomit that is bright red or looks like coffee grounds. You have bloody or black stools or stools that look like tar. You have a severe headache, a stiff neck, or both. You have severe pain, cramping, or bloating in your abdomen. You have difficulty breathing or are breathing very quickly. Your heart is beating very quickly. Your skin feels cold and clammy. You feel confused. You have signs of dehydration, such as: Dark urine, very little urine, or no urine. Cracked lips. Dry mouth. Sunken eyes. Sleepiness. Weakness. These symptoms may represent a serious problem that is an emergency. Do not wait to see if the symptoms will go away. Get medical help right away. Call your local emergency services (911 in the [place] Do not drive yourself to the hospital. Summary Nausea is the feeling that you have an upset stomach or  that you are about to vomit. Nausea on its own is not usually a serious concern, but it may be an early sign of a more serious medical problem. If vomiting develops, or if you are not able to drink enough fluids, you are at risk of becoming dehydrated. Follow recommendations for eating and drinking and take over-the-counter and prescription  medicines only as told by your health care provider. Contact a health care provider right away if your symptoms worsen or you have new symptoms. Keep all follow-up visits as told by your health care provider. This is important. This information is not intended to replace advice given to you by your health care provider. Make sure you discuss any questions you have with your healthcare provider. Document Revised: 05/11/2019 Document Reviewed: 11/18/2017 Elsevier Patient Education  [place].

## 2020-12-06 ENCOUNTER — Other Ambulatory Visit (HOSPITAL_BASED_OUTPATIENT_CLINIC_OR_DEPARTMENT_OTHER): Payer: Self-pay

## 2020-12-06 ENCOUNTER — Telehealth: Payer: Self-pay | Admitting: *Deleted

## 2020-12-06 ENCOUNTER — Other Ambulatory Visit (INDEPENDENT_AMBULATORY_CARE_PROVIDER_SITE_OTHER): Payer: Medicare Other

## 2020-12-06 ENCOUNTER — Encounter: Payer: Self-pay | Admitting: Hematology and Oncology

## 2020-12-06 ENCOUNTER — Ambulatory Visit: Payer: Medicare Other | Attending: Internal Medicine

## 2020-12-06 DIAGNOSIS — E7800 Pure hypercholesterolemia, unspecified: Secondary | ICD-10-CM | POA: Diagnosis not present

## 2020-12-06 DIAGNOSIS — R252 Cramp and spasm: Secondary | ICD-10-CM | POA: Insufficient documentation

## 2020-12-06 DIAGNOSIS — E1165 Type 2 diabetes mellitus with hyperglycemia: Secondary | ICD-10-CM

## 2020-12-06 DIAGNOSIS — R110 Nausea: Secondary | ICD-10-CM | POA: Insufficient documentation

## 2020-12-06 DIAGNOSIS — Z23 Encounter for immunization: Secondary | ICD-10-CM

## 2020-12-06 DIAGNOSIS — M7918 Myalgia, other site: Secondary | ICD-10-CM | POA: Diagnosis not present

## 2020-12-06 DIAGNOSIS — I10 Essential (primary) hypertension: Secondary | ICD-10-CM

## 2020-12-06 DIAGNOSIS — E559 Vitamin D deficiency, unspecified: Secondary | ICD-10-CM

## 2020-12-06 LAB — COMPREHENSIVE METABOLIC PANEL
ALT: 49 U/L — ABNORMAL HIGH (ref 0–35)
AST: 44 U/L — ABNORMAL HIGH (ref 0–37)
Albumin: 4.3 g/dL (ref 3.5–5.2)
Alkaline Phosphatase: 70 U/L (ref 39–117)
BUN: 22 mg/dL (ref 6–23)
CO2: 25 mEq/L (ref 19–32)
Calcium: 9.8 mg/dL (ref 8.4–10.5)
Chloride: 95 mEq/L — ABNORMAL LOW (ref 96–112)
Creatinine, Ser: 0.99 mg/dL (ref 0.40–1.20)
GFR: 57.96 mL/min — ABNORMAL LOW (ref >60.00)
Glucose, Bld: 235 mg/dL — ABNORMAL HIGH (ref 70–99)
Potassium: 4.6 mEq/L (ref 3.5–5.1)
Sodium: 135 mEq/L (ref 135–145)
Total Bilirubin: 0.6 mg/dL (ref 0.2–1.2)
Total Protein: 7.5 g/dL (ref 6.0–8.3)

## 2020-12-06 LAB — MAGNESIUM: Magnesium: 1.9 mg/dL (ref 1.5–2.5)

## 2020-12-06 LAB — LIPID PANEL
Cholesterol: 109 mg/dL (ref 0–200)
HDL: 31.4 mg/dL — ABNORMAL LOW (ref >39.00)
NonHDL: 77.27
Total CHOL/HDL Ratio: 3
Triglycerides: 266 mg/dL — ABNORMAL HIGH (ref 0.0–149.0)
VLDL: 53.2 mg/dL — ABNORMAL HIGH (ref 0.0–40.0)

## 2020-12-06 LAB — HEMOGLOBIN A1C: Hgb A1c MFr Bld: 9.7 % — ABNORMAL HIGH (ref 4.6–6.5)

## 2020-12-06 LAB — LDL CHOLESTEROL, DIRECT: Direct LDL: 50 mg/dL

## 2020-12-06 LAB — CBC
HCT: 36.6 % (ref 36.0–46.0)
Hemoglobin: 11.2 g/dL — ABNORMAL LOW (ref 12.0–15.0)
MCHC: 30.6 g/dL (ref 30.0–36.0)
MCV: 78.1 fl (ref 78.0–100.0)
Platelets: 298 10*3/uL (ref 150.0–400.0)
RBC: 4.68 Mil/uL (ref 3.87–5.11)
RDW: 19.4 % — ABNORMAL HIGH (ref 11.5–15.5)
WBC: 8.6 10*3/uL (ref 4.0–10.5)

## 2020-12-06 LAB — TSH: TSH: 29.57 u[IU]/mL — ABNORMAL HIGH (ref 0.35–4.50)

## 2020-12-06 LAB — VITAMIN D 25 HYDROXY (VIT D DEFICIENCY, FRACTURES): VITD: 17.38 ng/mL — ABNORMAL LOW (ref 30.00–100.00)

## 2020-12-06 MED ORDER — ZOSTER VAC RECOMB ADJUVANTED 50 MCG/0.5ML IM SUSR
INTRAMUSCULAR | 0 refills | Status: DC
  Filled 2020-12-06: qty 1, 1d supply, fill #0

## 2020-12-06 NOTE — Assessment & Plan Note (Signed)
Well controlled, no changes to meds. Encouraged heart healthy diet such as the DASH diet and exercise as tolerated.  °

## 2020-12-06 NOTE — Progress Notes (Signed)
   Covid-19 Vaccination Clinic  Name:  [name]    MRN: [id number] DOB: 1951/03/31  12/06/2020  [name] was observed post Covid-19 immunization for 15 minutes without incident. She was provided with Vaccine Information Sheet and instruction to access the V-Safe system.   [name] was instructed to call 911 with any severe reactions post vaccine: Difficulty breathing  Swelling of face and throat  A fast heartbeat  A bad rash all over body  Dizziness and weakness   Immunizations Administered     Name Date Dose VIS Date Route   Moderna Covid-19 Booster Vaccine 12/06/2020 11:31 AM 0.25 mL 04/12/2020 Intramuscular   Manufacturer: Moderna   Lot: [id number]   [place]: [phone]

## 2020-12-06 NOTE — Assessment & Plan Note (Signed)
Is startedon Wegovy 0.25 mg IM weekly x 4 weeks and then titrate as tolerated.

## 2020-12-06 NOTE — Assessment & Plan Note (Signed)
Tolerating statin, encouraged heart healthy diet, avoid trans fats, minimize simple carbs and saturated fats. Increase exercise as tolerated 

## 2020-12-06 NOTE — Assessment & Plan Note (Signed)
Poorly controlled. Add Glimepiride 2 mg po bid. minimize simple carbs. Increase exercise as tolerated. Continue current meds

## 2020-12-06 NOTE — Assessment & Plan Note (Signed)
Supplement and monitor 

## 2020-12-06 NOTE — Assessment & Plan Note (Signed)
TSH significantly elevated. Will increase Levothyroxine to 200 mcg daily and reevaluate.

## 2020-12-06 NOTE — Telephone Encounter (Signed)
Prior auth started for [facility] 0.25mg  via cover my meds.    Key: [id number]

## 2020-12-06 NOTE — Progress Notes (Signed)
Pt here for 2nd venipuncture attempt.  No charge today.

## 2020-12-06 NOTE — Assessment & Plan Note (Signed)
She notes it has been bad and keeping her form eating in the morning for roughly the past 6 weeks which correlates pretty close to her Victoza dose increasing to 1.8, will drop her to 1.2 and try and switch her to [facility] weekly if her insurance will cover it.

## 2020-12-06 NOTE — Assessment & Plan Note (Signed)
Hydrate and monitor 

## 2020-12-07 ENCOUNTER — Other Ambulatory Visit (HOSPITAL_BASED_OUTPATIENT_CLINIC_OR_DEPARTMENT_OTHER): Payer: Self-pay

## 2020-12-07 ENCOUNTER — Other Ambulatory Visit: Payer: Self-pay | Admitting: Family Medicine

## 2020-12-07 MED ORDER — COVID-19 MRNA VACC (MODERNA) 100 MCG/0.5ML IM SUSP
INTRAMUSCULAR | 0 refills | Status: DC
  Filled 2020-12-07: qty 0.25, 1d supply, fill #0

## 2020-12-08 ENCOUNTER — Other Ambulatory Visit: Payer: Self-pay | Admitting: Family Medicine

## 2020-12-08 MED ORDER — VICTOZA 18 MG/3ML ~~LOC~~ SOPN: 1.2000 mg | PEN_INJECTOR | Freq: Every day | SUBCUTANEOUS | 0 refills | Status: DC

## 2020-12-08 NOTE — Telephone Encounter (Signed)
Prior [name] was denied.  Patient will get letter.  Decision Notes: Drugs when used for anorexia, weight loss, or weight gain are excluded from coverage under Medicare rules. Please refer to your Evidence of Coverage (EOC) section that references Part D drug coverage in your pharmacy plan documents for more information. Reviewed by: [email or web address], R.Ph.

## 2020-12-11 ENCOUNTER — Telehealth: Payer: Self-pay | Admitting: *Deleted

## 2020-12-11 NOTE — Telephone Encounter (Signed)
Symptoms are better now per patient.

## 2020-12-11 NOTE — Telephone Encounter (Signed)
-----   Message from [name], MD sent at 12/06/2020  7:26 PM EDT ----- As patient suspected her sugar is up. Have her increase her Glimepiride to 2 mg tabs. 1 tab po bid while we work on her other med. Disp #60 with 2 rf. Also thyroid is off. Increase her Levothyroxine to 200 mcg tabs, 1 tab po daily disp #30 with 2 rf. Recheck labs in 3 months and I am happy to refer her to enocrinology here in the office if she would like to talk with them

## 2020-12-11 NOTE — Telephone Encounter (Signed)
Results given to patient.  She started the 1.2 of the Victoza and she is doing fine on the 1.2 as well as her sugars. Patient would like to know if you still want to increase glimepiride?

## 2020-12-12 NOTE — Telephone Encounter (Signed)
Patient notified

## 2020-12-28 ENCOUNTER — Other Ambulatory Visit: Payer: Self-pay | Admitting: *Deleted

## 2020-12-28 ENCOUNTER — Other Ambulatory Visit: Payer: Self-pay | Admitting: Family Medicine

## 2020-12-28 NOTE — Telephone Encounter (Signed)
Patient states is supposed to be levothyroxine (SYNTHROID 200 mcg. Patient took her last pill today. Please advise    [place], [address]  [place], [place] [address]  Phone:  [phone]  Fax:  [phone]

## 2020-12-29 ENCOUNTER — Other Ambulatory Visit: Payer: Self-pay | Admitting: *Deleted

## 2020-12-29 MED ORDER — LEVOTHYROXINE SODIUM 200 MCG PO TABS: 200.0000 ug | ORAL_TABLET | Freq: Every day | ORAL | 3 refills | Status: DC

## 2020-12-29 NOTE — Telephone Encounter (Signed)
Spoke with patient and advised that new rx for the 26mcg has been sent in.

## 2021-01-01 ENCOUNTER — Other Ambulatory Visit: Payer: Self-pay | Admitting: Family Medicine

## 2021-01-12 ENCOUNTER — Other Ambulatory Visit: Payer: Self-pay | Admitting: Family Medicine

## 2021-01-22 ENCOUNTER — Other Ambulatory Visit: Payer: Self-pay | Admitting: Family Medicine

## 2021-01-23 DIAGNOSIS — J328 Other chronic sinusitis: Secondary | ICD-10-CM | POA: Diagnosis not present

## 2021-01-23 DIAGNOSIS — J310 Chronic rhinitis: Secondary | ICD-10-CM | POA: Diagnosis not present

## 2021-02-01 DIAGNOSIS — G4733 Obstructive sleep apnea (adult) (pediatric): Secondary | ICD-10-CM | POA: Diagnosis not present

## 2021-02-07 ENCOUNTER — Other Ambulatory Visit: Payer: Self-pay | Admitting: Family Medicine

## 2021-02-23 ENCOUNTER — Encounter: Payer: Self-pay | Admitting: *Deleted

## 2021-03-14 ENCOUNTER — Other Ambulatory Visit: Payer: Self-pay | Admitting: *Deleted

## 2021-03-14 NOTE — Patient Outreach (Signed)
[facility]) Care Management  03/14/2021  Sima Lindenberger [birth year] [id number]  Referral for medication assistance sent to [place].  [name] [facility] Management Assistant [phone]

## 2021-03-14 NOTE — Patient Outreach (Signed)
[facility]) Care Management  [place]  03/14/2021   [name] [birth year] [id number]  Subjective: Successful telephone outreach call to patient. HIPAA identifiers obtained. Patient states she is dong well at this time. Nurse discussed with patient her health goals and her health and wellness needs which were documented in the Epic system. Patient did not have any further questions or concerns today and did confirm that she has this nurse's contact number to call her if needed.   Encounter Medications:  Outpatient Encounter Medications as of 03/14/2021  Medication Sig Note   acetaminophen (TYLENOL) 500 MG tablet Take 1,000 mg by mouth 2 (two) times daily as needed for moderate pain or headache.    albuterol (VENTOLIN HFA) 108 (90 Base) MCG/ACT inhaler Inhale 2 puffs every 4-6 hours as needed - rescue.    anastrozole (ARIMIDEX) 1 MG tablet Take 1 tablet (1 mg total) by mouth daily.    busPIRone (BUSPAR) 7.5 MG tablet Take 1 tablet (7.5 mg total) by mouth 3 (three) times daily.    carvedilol (COREG) 12.5 MG tablet Take 1 tablet 2 times daily with a meal.    COVID-19 mRNA vaccine, Moderna, 100 MCG/0.5ML injection Inject into the muscle.    diclofenac (VOLTAREN) 75 MG EC tablet TAKE 1 TABLET 2 TIMES A DAY    diltiazem (CARDIZEM CD) 120 MG 24 hr capsule Take 1 capsule (120 mg total) by mouth daily.    Docusate Calcium (STOOL SOFTENER PO) Take by mouth daily.    escitalopram (LEXAPRO) 20 MG tablet TAKE 1 TABLET EVERY MORNING AND 1/2 EVERY EVENING    esomeprazole (NEXIUM) 40 MG capsule TAKE 1 CAPSULE ONCE DAILY    ezetimibe (ZETIA) 10 MG tablet Take 1 tablet (10 mg total) by mouth daily.    fenofibrate (TRICOR) 145 MG tablet TAKE ONE TABLET ONCE DAILY    fluticasone (FLONASE) 50 MCG/ACT nasal spray Place 2 sprays into both nostrils daily.    fluticasone furoate-vilanterol (BREO ELLIPTA) 100-25 MCG/INH AEPB Inhale 1 puff then rinse mouth, once daily     furosemide (LASIX) 20 MG tablet Take 1 tablet (20 mg total) by mouth daily.    glimepiride (AMARYL) 1 MG tablet TAKE ONE TABLET ONCE DAILY WITH BREAKFAST    glucose blood (ACCU-CHEK AVIVA) test strip Twice daily (please use brand that patient insurance covers)    Hypromellose (ARTIFICIAL TEARS OP) Place 1 drop into both eyes daily as needed (dry eyes).     levothyroxine (SYNTHROID) 200 MCG tablet Take 1 tablet (200 mcg total) by mouth daily.    liraglutide (VICTOZA) 18 MG/3ML SOPN Inject 1.2 mg into the skin daily. Start 0.6mg  SQ once a day for 7 days, then increase to 1.2mg  SQ once a day x 7 days and then 1.8 mg SQ daily    loratadine (CLARITIN) 10 MG tablet Take 1 tablet (10 mg total) by mouth daily.    losartan (COZAAR) 100 MG tablet Take 1 tablet (100 mg total) by mouth daily.    meclizine (ANTIVERT) 25 MG tablet Take 25 mg by mouth every 6 (six) hours as needed.    melatonin 5 MG TABS Take by mouth.    metFORMIN (GLUCOPHAGE) 500 MG tablet TAKE 2 TABLETS 2 TIMES A DAY WITH MEALS    montelukast (SINGULAIR) 10 MG tablet Take 1 tablet (10 mg total) by mouth at bedtime.    rosuvastatin (CRESTOR) 40 MG tablet Take 1 tablet (40 mg total) by mouth daily.    Semaglutide-Weight  Management (WEGOVY) 0.25 MG/0.5ML SOAJ Inject 0.25 mg into the skin once a week.    TRUEPLUS PEN NEEDLES 31G X 6 MM MISC USE WITH INSULIN UP TO 4 TIMES A DAY AS NEEDED    Vitamin D, Ergocalciferol, (DRISDOL) 1.25 MG (50000 UNIT) CAPS capsule Take 1 capsule (50,000 Units total) by mouth every 7 (seven) days.    Zoster Vaccine Adjuvanted [facility]) injection Inject into the muscle. (Patient not taking: Reported on 03/14/2021) 03/14/2021: completed   No facility-administered encounter medications on file as of 03/14/2021.    Functional Status:  In your present state of health, do you have any difficulty performing the following activities: 07/04/2020  Hearing? N  Vision? N  Difficulty concentrating or making decisions? N   Walking or climbing stairs? Y  Comment knee problems  Dressing or bathing? N  Doing errands, shopping? N  Preparing Food and eating ? N  Using the Toilet? N  In the past six months, have you accidently leaked urine? Y  Comment some incontinence at times  Do you have problems with loss of bowel control? N  Managing your Medications? N  Managing your Finances? N  Housekeeping or managing your Housekeeping? N  Some recent data might be hidden    Fall/Depression Screening: Fall Risk  03/14/2021 08/28/2020 07/04/2020  Falls in the past year? 1 1 0  Number falls in past yr: 1 0 0  Injury with Fall? 0 0 0  Comment - - -  Risk for fall due to : Impaired mobility;Impaired balance/gait;History of fall(s) Impaired balance/gait;History of fall(s);Impaired mobility -  Risk for fall due to: Comment - - -  Follow up Falls prevention discussed;Education provided;Falls evaluation completed Education provided;Falls evaluation completed;Falls prevention discussed -   PHQ 2/9 Scores 08/28/2020 07/04/2020 10/18/2019 09/23/2019 09/06/2019 04/02/2019 09/18/2018  PHQ - 2 Score 1 0 0 0 2 0 0  PHQ- 9 Score - - - - 9 - -  Exception Documentation - - - - - - -    Assessment:   Care Plan There are no care plans that you recently modified to display for this patient.    Goals Addressed               This Visit's Progress     Patient Stated     [facility]) Increase physical activity (pt-stated)        Timeframe:  Long-Range Goal Priority:  High Start Date: 08/28/20                            Expected End Date: 09/20/21 Follow Up Date: 06/22/21  -Discussed beginning by doing chair exercises -Discussed doing chair exercises 2 times a week and continuation of maintaining her home -Encouraged patient to exercise with her daughter and to do AHOY exercises on YouTube  Notes: 03/14/21: Patient reports that she continues to do chair exercises but not as routinely as she wants. She explains that she has a lot of fun  when she exercises with her daughter and she plans to do that more routinely. Nurse discussed doing AHOY senior exercises on YouTube.  Updated 11/30/20: Patient states that she did receive the chair exercise printouts and does them occasionally. Patient states she will begin to do the chair exercises 2 times a week and continue to do her house-hold chores to maintain her home.   08/28/20: patient states she will start doing chair exercises with a goal of eventually walking to  increase her physical activity. Nurse will send patient chair exercise printouts.                           Weight (lb) < 300 lb (136.1 kg) (pt-stated)        Timeframe:  Long-Range Goal Priority:  High Start Date:  08/28/20                           Expected End Date: 09/20/21     Follow Up Date: 06/22/21  -Encourage patient to set obtainable goal -Discussed going back to [facility] -Discussed reducing calories and portion size - Encouraged patient to monitor carbohydrates and sugar -Discussed eating healthier at night and reducing portion size at night -Discussed doing chair exercises 2 times weekly as well as maintaining her home -Encouraged continuation of eating sliced apples and cucumbers as a snack -Discussed doing exercises with her daughter and doing AHOY exercises on YouTube -Encouraged continuation of not having junk food like potato chips in her house  Notes: 03/14/21: Patient states that she is not keeping junk food like potato chips and cookies in her home. She has started to eat slice apples and cucumbers at night as a snack instead of unhealthy foods. Patient reports that she continues to drink a lot of water and that she is working towards going back to the [facility].   Updated 11/30/20: Patient states that she has begun to cook more at home and she did receive the planning healthy meals booklet. Patient identified that she eats more at night. She will work towards eating  healthier at night and reducing her portion size. Patient plans to discuss restarting  Weight Program with Dr. Charlett Blake during her upcoming visit on 12/05/20.         08/28/20:  Patient states she wants to lose weight for her overall health. Patient explained she was doing well with the [facility] and plans to go back once she recovers from her surgery. Nurse will send a planning healthy meals booklet and chair exercise printouts.            Other     [facility]) Patient verbalize speaking with pharmacy to inquire abut Victoza medication assistance        Timeframe:  Short-Term Goal Priority:  Medium Start Date: 03/14/21                            Expected End Date:  06/22/21 Follow Up Date: 06/22/21                      Note: 03/14/21: Patient explains that it is becoming increasingly difficult for her to afford her medication Victoza. Nurse will send a referral to pharmacy to inquire if the patient can qualify for medication assistance.       [facility]) Patient will verbalize starting her day earlier within the next 90 days        Timeframe:  Long-Range Goal Priority:  Medium Start Date:  03/14/21                          Expected End Date:  03/23/22                   Follow Up Date:  06/22/21  -Discussed feeling better when she starts her day earlier -Discussed eating something small like a boiled egg, yogurt, Glucerna to help with morning nausea  Notes: 03/14/21: Patient explains that she does feel better when she gets her day started earlier. She reports that eating a boiled egg soon after she woke up did help with her chronic morning nausea. Patient discussed that she feels more energized and motivated for the day when she starts her day earlier. Nurse will send patient Glucerna coupons.      [facility]) Set My Target A1C-Diabetes Type 2        Timeframe:  Long-Range Goal Priority:  High Start Date: 08/28/20                            Expected End Date: 08/28/21                       Follow Up Date 06/22/21    - set target A1C    Why is this important?   Your target A1C is decided together by you and your doctor.  It is based on several things like your age and other health issues.    Notes: Patient states she would like to to lower her A1c to 7 or below. Her current A1c is 9.7.      [facility]-Monitor and Manage My Blood Sugar-Diabetes Type 2        Timeframe:  Long-Range Goal Priority:  High Start Date:   07/04/20                          Expected End Date:  05/23/22                   Follow Up Date 06/22/21   - check blood sugar at prescribed times - take the blood sugar log to all doctor visits  -Encouraged patient to limit sugar and carbohydrates in her diet -Discussed increasing her physical activity by doing chair exercises 2 times a week exercising with her daughter, doing AHOY exercises on YouTube, and to continue to maintain her home -Encouraged patient to continue to eat cut up vegetables and fruits as snacks  Why is this important?   Checking your blood sugar at home helps to keep it from getting very high or very low.  Writing the results in a diary or log helps the doctor know how to care for you.  Your blood sugar log should have the time, date and the results.  Also, write down the amount of insulin or other medicine that you take.  Other information, like what you ate, exercise done and how you were feeling, will also be helpful.     Notes: 03/14/21: Patient explains that her diabetes control has not been as good as she would like recently. Patient states that her blood sugar readings are usually around 200 and admits that she does need to check her blood sugar more routinely. She reports that she is trying to eat healthier and to limit her sugar and carbohydrates. Patient states she is doing some better with controlling her diabetes and she has stopped buying sweets, potato chips, and other junk food.   Updated 11/30/20: Patient states that she  continues to take her blood sugar daily. Her ranges have maintained in the 100's. Patient explains that she is working towards eating healthier, increasing her physical activity,  and losing weight to improve her health and wellness.   Updated 08/28/20: Patient reports taking her blood sugar twice daily. She explains that her blood sugar ranges have improved since her discharge from the hospital with ranges of 100-130's.         Plan: RN Health Coach will send PCP a quarterly update, will send patient weight loss education and Glucerna coupons, will send a referral to pharmacy for medication assistance, and will call patient within the month of December. Follow-up: Patient agrees to Care Plan and Follow-up.  Emelia Loron RN, BSN [place] [phone] [email or web address]

## 2021-03-14 NOTE — Patient Instructions (Addendum)
Goals Addressed               This Visit's Progress     Patient Stated     [facility]) Increase physical activity (pt-stated)        Timeframe:  Long-Range Goal Priority:  High Start Date: 08/28/20                            Expected End Date: 09/20/21 Follow Up Date: 06/22/21  -Discussed beginning by doing chair exercises -Discussed doing chair exercises 2 times a week and continuation of maintaining her home -Encouraged patient to exercise with her daughter and to do AHOY exercises on YouTube  Notes: 03/14/21: Patient reports that she continues to do chair exercises but not as routinely as she wants. She explains that she has a lot of fun when she exercises with her daughter and she plans to do that more routinely. Nurse discussed doing AHOY senior exercises on YouTube.  Updated 11/30/20: Patient states that she did receive the chair exercise printouts and does them occasionally. Patient states she will begin to do the chair exercises 2 times a week and continue to do her house-hold chores to maintain her home.   08/28/20: patient states she will start doing chair exercises with a goal of eventually walking to increase her physical activity. Nurse will send patient chair exercise printouts.                           Weight (lb) < 300 lb (136.1 kg) (pt-stated)        Timeframe:  Long-Range Goal Priority:  High Start Date:  08/28/20                           Expected End Date: 09/20/21     Follow Up Date: 06/22/21  -Encourage patient to set obtainable goal -Discussed going back to [facility] -Discussed reducing calories and portion size - Encouraged patient to monitor carbohydrates and sugar -Discussed eating healthier at night and reducing portion size at night -Discussed doing chair exercises 2 times weekly as well as maintaining her home -Encouraged continuation of eating sliced apples and cucumbers as a snack -Discussed doing exercises with her daughter and doing  AHOY exercises on YouTube -Encouraged continuation of not having junk food like potato chips in her house  Notes: 03/14/21: Patient states that she is not keeping junk food like potato chips and cookies in her home. She has started to eat slice apples and cucumbers at night as a snack instead of unhealthy foods. Patient reports that she continues to drink a lot of water and that she is working towards going back to the [facility].   Updated 11/30/20: Patient states that she has begun to cook more at home and she did receive the planning healthy meals booklet. Patient identified that she eats more at night. She will work towards eating healthier at night and reducing her portion size. Patient plans to discuss restarting [facility] with Dr. Charlett Blake during her upcoming visit on 12/05/20.         08/28/20:  Patient states she wants to lose weight for her overall health. Patient explained she was doing well with the [place] [facility] and plans to go back once she recovers from her surgery. Nurse will send a planning  healthy meals booklet and chair exercise printouts.            Other     [facility]) Patient verbalize speaking with pharmacy to inquire abut Victoza medication assistance        Timeframe:  Short-Term Goal Priority:  Medium Start Date: 03/14/21                            Expected End Date:  06/22/21 Follow Up Date: 06/22/21                      Note: 03/14/21: Patient explains that it is becoming increasingly difficult for her to afford her medication Victoza. Nurse will send a referral to pharmacy to inquire if the patient can qualify for medication assistance.       [facility]) Patient will verbalize starting her day earlier within the next 90 days        Timeframe:  Long-Range Goal Priority:  Medium Start Date:  03/14/21                          Expected End Date:  03/23/22                   Follow Up Date: 06/22/21  -Discussed feeling better when she  starts her day earlier -Discussed eating something small like a boiled egg, yogurt, Glucerna to help with morning nausea  Notes: 03/14/21: Patient explains that she does feel better when she gets her day started earlier. She reports that eating a boiled egg soon after she woke up did help with her chronic morning nausea. Patient discussed that she feels more energized and motivated for the day when she starts her day earlier. Nurse will send patient Glucerna coupons.      [facility]) Set My Target A1C-Diabetes Type 2        Timeframe:  Long-Range Goal Priority:  High Start Date: 08/28/20                            Expected End Date: 08/28/21                      Follow Up Date 06/22/21    - set target A1C    Why is this important?   Your target A1C is decided together by you and your doctor.  It is based on several things like your age and other health issues.    Notes: Patient states she would like to to lower her A1c to 7 or below. Her current A1c is 9.7.      [facility]-Monitor and Manage My Blood Sugar-Diabetes Type 2        Timeframe:  Long-Range Goal Priority:  High Start Date:   07/04/20                          Expected End Date:  05/23/22                   Follow Up Date 06/22/21   - check blood sugar at prescribed times - take the blood sugar log to all doctor visits  -Encouraged patient to limit sugar and carbohydrates in her diet -Discussed increasing her physical activity by doing chair exercises 2 times a week exercising with her daughter, doing AHOY  exercises on YouTube, and to continue to maintain her home -Encouraged patient to continue to eat cut up vegetables and fruits as snacks  Why is this important?   Checking your blood sugar at home helps to keep it from getting very high or very low.  Writing the results in a diary or log helps the doctor know how to care for you.  Your blood sugar log should have the time, date and the results.  Also, write down the amount of insulin  or other medicine that you take.  Other information, like what you ate, exercise done and how you were feeling, will also be helpful.     Notes: 03/14/21: Patient explains that her diabetes control has not been as good as she would like recently. Patient states that her blood sugar readings are usually around 200 and admits that she does need to check her blood sugar more routinely. She reports that she is trying to eat healthier and to limit her sugar and carbohydrates. Patient states she is doing some better with controlling her diabetes and she has stopped buying sweets, potato chips, and other junk food.   Updated 11/30/20: Patient states that she continues to take her blood sugar daily. Her ranges have maintained in the 100's. Patient explains that she is working towards eating healthier, increasing her physical activity, and losing weight to improve her health and wellness.   Updated 08/28/20: Patient reports taking her blood sugar twice daily. She explains that her blood sugar ranges have improved since her discharge from the hospital with ranges of 100-130's.

## 2021-03-15 DIAGNOSIS — G4733 Obstructive sleep apnea (adult) (pediatric): Secondary | ICD-10-CM | POA: Diagnosis not present

## 2021-03-15 DIAGNOSIS — I10 Essential (primary) hypertension: Secondary | ICD-10-CM | POA: Diagnosis not present

## 2021-03-15 DIAGNOSIS — Z800 Family history of malignant neoplasm of digestive organs: Secondary | ICD-10-CM | POA: Diagnosis not present

## 2021-03-15 DIAGNOSIS — K5730 Diverticulosis of large intestine without perforation or abscess without bleeding: Secondary | ICD-10-CM | POA: Diagnosis not present

## 2021-03-15 DIAGNOSIS — Z1211 Encounter for screening for malignant neoplasm of colon: Secondary | ICD-10-CM | POA: Diagnosis not present

## 2021-03-15 DIAGNOSIS — Z86010 Personal history of colonic polyps: Secondary | ICD-10-CM | POA: Diagnosis not present

## 2021-03-16 ENCOUNTER — Telehealth: Payer: Self-pay | Admitting: Pharmacist

## 2021-03-16 ENCOUNTER — Other Ambulatory Visit: Payer: Self-pay | Admitting: Gastroenterology

## 2021-03-16 ENCOUNTER — Telehealth: Payer: Self-pay | Admitting: Family Medicine

## 2021-03-16 DIAGNOSIS — R7989 Other specified abnormal findings of blood chemistry: Secondary | ICD-10-CM

## 2021-03-16 NOTE — Progress Notes (Signed)
[facility])  [place] Team    03/16/2021  [name] 1951-01-27 [id number]  Reason for referral: Medication Assistance with GLP1 Inhibitor   Referral source: [facility] RN Current insurance: [facility]   Outreach:  Successful telephone call with [name].  HIPAA identifiers verified.    Objective: The ASCVD Risk score (Arnett DK, et al., 2019) failed to calculate for the following reasons:   The valid total cholesterol range is 130 to 320 mg/dL  Lab Results  Component Value Date   CREATININE 0.99 12/06/2020   CREATININE 1.03 09/05/2020   CREATININE 0.74 06/02/2020    Lab Results  Component Value Date   HGBA1C 9.7 (H) 12/06/2020    Lipid Panel     Component Value Date/Time   CHOL 109 12/06/2020 1104   CHOL 123 07/14/2019 0739   TRIG 266.0 (H) 12/06/2020 1104   HDL 31.40 (L) 12/06/2020 1104   HDL 40 07/14/2019 0739   CHOLHDL 3 12/06/2020 1104   VLDL 53.2 (H) 12/06/2020 1104   LDLCALC 67 06/02/2020 1427   LDLDIRECT 50.0 12/06/2020 1104    BP Readings from Last 3 Encounters:  01/29/21 122/82  12/05/20 110/68  07/17/20 124/76    Allergies  Allergen Reactions   No Known Allergies     Medication Assistance Findings:  Medication assistance needs identified: Patient would prefer to go back to Ozempic as this has better controlled her diabetes in the past and caused her less GI symptoms.   Extra Help:  May be eligible for   Extra Help Low Income Subsidy based on reported income and assets will screen further once we receive proof of household income.   Patient Assistance Programs: Ozempic made by [employer] requirement met:  Potentially, will access further one we receive proof of household income.  Out-of-pocket prescription expenditure met:   Not Applicable Reviewed program requirements with patient.     Additional medication assistance options reviewed with patient as warranted:   Insurance OTC catalogue Patient is aware of the benefit but not currently using it. Will send something out to patient along with the application for medication assistance.   Plan: I will route patient assistance letter to [place] technician who will coordinate patient assistance program application process for medications listed above.  [facility] pharmacy technician will assist with obtaining all required documents from both patient and provider(s) and submit application(s) once completed.

## 2021-03-16 NOTE — Telephone Encounter (Signed)
error 

## 2021-03-20 ENCOUNTER — Other Ambulatory Visit: Payer: Self-pay

## 2021-03-20 ENCOUNTER — Encounter: Payer: Self-pay | Admitting: Family

## 2021-03-20 ENCOUNTER — Ambulatory Visit (INDEPENDENT_AMBULATORY_CARE_PROVIDER_SITE_OTHER): Payer: Medicare Other | Admitting: Family

## 2021-03-20 VITALS — BP 151/82 | HR 77 | Temp 99.1°F | Resp 16 | Ht 65.0 in | Wt 325.0 lb

## 2021-03-20 DIAGNOSIS — E1165 Type 2 diabetes mellitus with hyperglycemia: Secondary | ICD-10-CM

## 2021-03-20 DIAGNOSIS — I10 Essential (primary) hypertension: Secondary | ICD-10-CM | POA: Diagnosis not present

## 2021-03-20 DIAGNOSIS — E039 Hypothyroidism, unspecified: Secondary | ICD-10-CM | POA: Diagnosis not present

## 2021-03-20 DIAGNOSIS — Z23 Encounter for immunization: Secondary | ICD-10-CM

## 2021-03-20 LAB — COMPREHENSIVE METABOLIC PANEL
ALT: 46 U/L — ABNORMAL HIGH (ref 0–35)
AST: 58 U/L — ABNORMAL HIGH (ref 0–37)
Albumin: 4 g/dL (ref 3.5–5.2)
Alkaline Phosphatase: 78 U/L (ref 39–117)
BUN: 20 mg/dL (ref 6–23)
CO2: 25 mEq/L (ref 19–32)
Calcium: 10.2 mg/dL (ref 8.4–10.5)
Chloride: 101 mEq/L (ref 96–112)
Creatinine, Ser: 0.85 mg/dL (ref 0.40–1.20)
GFR: 69.46 mL/min (ref >60.00)
Glucose, Bld: 175 mg/dL — ABNORMAL HIGH (ref 70–99)
Potassium: 4.7 mEq/L (ref 3.5–5.1)
Sodium: 138 mEq/L (ref 135–145)
Total Bilirubin: 0.4 mg/dL (ref 0.2–1.2)
Total Protein: 7.3 g/dL (ref 6.0–8.3)

## 2021-03-20 LAB — TSH: TSH: 21.89 u[IU]/mL — ABNORMAL HIGH (ref 0.35–5.50)

## 2021-03-20 LAB — HEMOGLOBIN A1C: Hgb A1c MFr Bld: 8.7 % — ABNORMAL HIGH (ref 4.6–6.5)

## 2021-03-20 NOTE — Progress Notes (Signed)
Subjective:   By signing my name below, I, Lyric Barr-McArthur, attest that this documentation has been prepared under the direction and in the presence of Debbrah Alar, NP, 03/20/2021   [name], female    DOB: 19-Sep-1950, 70 y.o.   MRN: [id number]  Chief Complaint  Patient presents with   Hypertension    Patient reports her BP was 180 /107 at GI office.     HPI Patient is in today for an office visit.   Blood Pressure: She complains of an episode of high blood pressure when she went to visit another provider to discuss a colonoscopy which resulted in a reading of 180/107. She mentions being nervous at the doctors and feeling rushed which could have lead to the high blood pressure reading. Her blood pressure in the office today was in good range at 151/82. She has been compliant in taking her 120 mg diltiazem, 12.5 mg coreg, and 100 mg losartan. BP Readings from Last 3 Encounters:  03/20/21 (!) 151/82  01/29/21 122/82  12/05/20 110/68   Overeating: She notes that is overeating at night specifically and is left not feeling full. She denies any big increases or decreases in her blood sugar. Notes most sugar readings are "in the 200's."  Immunizations: She has received her flu shot during this visit. She is also interested in receiving the updated Covid-19 booster shot.    Health Maintenance Due  Topic Date Due   TETANUS/TDAP  06/24/2018   Zoster Vaccines- Shingrix (2 of 2) 06/16/2019    Past Medical History:  Diagnosis Date   Anemia 04/05/2012   Anxiety    Anxiety state 09/11/2007   Qualifier: Diagnosis of  By: Sherren Mocha RN, Dorian Pod     Asthma    Atrial tachycardia, paroxysmal ([place])    Back pain 10/02/2014   Breast cancer ([place]) 02/17/2017   CAD (coronary artery disease), native coronary artery    remote Cath with nonobstructive ASCAD with 20% mid LAD, 20% prox left circ and 20% ostial RCA   Chicken pox as a child   Chronic diastolic CHF (congestive  heart failure), NYHA class 1 ([place])    Complication of anesthesia    pt states feels different in her body after anesthesia when waking up and also experiences a smell of burnt plastic for approx a wk    Constipation    Depression    Diabetes ([place])    Dilated aortic root ([place])    20m by echo 08/2020   Dysuria 02/17/2017   Elevated LFTs 04/05/2012   GERD (gastroesophageal reflux disease)    Goiter    Heart murmur    hx of one at birth    History of kidney stones    History of radiation therapy 10/31/16-12/17/16   left breast 45 Gy in 25 fractions, left breast boost 16 Gy in 8 fractions   Hx of colonic polyps    Hyperlipidemia    Hypertension    Insomnia 10/08/2016   Joint pain    Malignant neoplasm of upper-outer quadrant of left female breast ([place] 05/14/2016   not with patient   Measles as a child   Mumps as a child   OA (osteoarthritis) of knee 04/05/2012   Obesity 07/11/2014   PONV (postoperative nausea and vomiting)    Preventative health care 09/05/2013   PVC's (premature ventricular contractions) 05/02/2016   Shortness of breath dyspnea    walking distances / climbing stairs   Sleep apnea 04/05/2012  Tinea corporis 02/23/2013   Type 2 diabetes mellitus with hyperglycemia ([place]) 12/31/2013   Vasomotor rhinitis 04/05/2012   Ventral hernia    Wheezing     Past Surgical History:  Procedure Laterality Date   BREAST LUMPECTOMY Left 2017   BREAST LUMPECTOMY WITH RADIOACTIVE SEED AND SENTINEL LYMPH NODE BIOPSY Left 06/21/2016   Procedure: LEFT BREAST LUMPECTOMY WITH RADIOACTIVE SEED AND SENTINEL LYMPH NODE BIOPSY, INJECT BLUE DYE LEFT BREAST;  Surgeon: Fanny Skates, MD;  Location: [place];  Service: General;  Laterality: Left;   CARDIAC CATHETERIZATION     normal coroary arteries per patient   CARDIOVASCULAR STRESS TEST     10/12/2013   CESAREAN SECTION     X 3   CHOLECYSTECTOMY     COLONOSCOPY WITH PROPOFOL N/A 03/28/2015   Procedure: COLONOSCOPY WITH PROPOFOL;  Surgeon:  Juanita Craver, MD;  Location: [facility];  Service: Endoscopy;  Laterality: N/A;   HERNIA REPAIR  02/08/11   ventral hernia   JOINT REPLACEMENT     bilateral   KNEE ARTHROSCOPY  05/2010   bilateral   LEFT AND RIGHT HEART CATHETERIZATION WITH CORONARY ANGIOGRAM N/A 11/08/2013   Procedure: LEFT AND RIGHT HEART CATHETERIZATION WITH CORONARY ANGIOGRAM;  Surgeon: [name], MD;  Location[facility] CATH LAB;  Service: Cardiovascular;  Laterality: N/A;   MENISCUS REPAIR  2009   MOUTH SURGERY     teeth implants   PORT-A-CATH REMOVAL N/A 01/24/2017   Procedure: REMOVAL PORT-A-CATH;  Surgeon: Fanny Skates, MD;  Location: [place];  Service: General;  Laterality: N/A;   PORTACATH PLACEMENT Right 07/16/2016   Procedure: INSERTION PORT-A-CATH RIGHT INTERNAL JUGULAR WITH ULTRASOUND;  Surgeon: Fanny Skates, MD;  Location: [place];  Service: General;  Laterality: Right;   REPAIR DURAL / CSF LEAK  2021   performed at [facility] in [place]  2011   left   TOTAL KNEE ARTHROPLASTY Right 05/10/2014   Procedure: RIGHT TOTAL KNEE ARTHROPLASTY;  Surgeon: Mauri Pole, MD;  Location: [facility];  Service: Orthopedics;  Laterality: Right;   TUBAL LIGATION     WISDOM TOOTH EXTRACTION  2000    Family History  Problem Relation Age of Onset   Thyroid disease Mother    Hyperlipidemia Mother    Depression Mother    Anxiety disorder Mother    Heart attack Father 93   Hypertension Father    Arthritis Father        RA   Coronary artery disease Father    High Cholesterol Father    Coronary artery disease Brother    Heart disease Brother    Cancer Maternal Aunt        colon   Cancer Maternal Grandmother        colon   Heart attack Maternal Grandfather    Alcohol abuse Paternal Grandfather     Social History   Socioeconomic History   Marital status: Divorced    Spouse name: Not on file   Number of children: 3   Years of education: Not on file   Highest education  level: Not on file  Occupational History   Occupation: retired - [employer]  Tobacco Use   Smoking status: Never   Smokeless tobacco: Never  Vaping Use   Vaping Use: Never used  Substance and Sexual Activity   Alcohol use: Not Currently    Comment: rarely   Drug use: No   Sexual activity: Never  Other Topics Concern  Not on file  Social History Narrative   Not on file   Social Determinants of Health   Financial Resource Strain: Not on file  Food Insecurity: No Food Insecurity   Worried About Running Out of Food in the Last Year: Never true   Ran Out of Food in the Last Year: Never true  Transportation Needs: No Transportation Needs   Lack of Transportation (Medical): No   Lack of Transportation (Non-Medical): No  Physical Activity: Not on file  Stress: Not on file  Social Connections: Not on file  Intimate Partner Violence: Not on file    Outpatient Medications Prior to Visit  Medication Sig Dispense Refill   acetaminophen (TYLENOL) 500 MG tablet Take 1,000 mg by mouth 2 (two) times daily as needed for moderate pain or headache.     albuterol (VENTOLIN HFA) 108 (90 Base) MCG/ACT inhaler Inhale 2 puffs every 4-6 hours as needed - rescue. 18 g 12   anastrozole (ARIMIDEX) 1 MG tablet Take 1 tablet (1 mg total) by mouth daily. 90 tablet 3   busPIRone (BUSPAR) 7.5 MG tablet Take 1 tablet (7.5 mg total) by mouth 3 (three) times daily. 90 tablet 3   carvedilol (COREG) 12.5 MG tablet Take 1 tablet 2 times daily with a meal. 180 tablet 1   COVID-19 mRNA vaccine, Moderna, 100 MCG/0.5ML injection Inject into the muscle. 0.25 mL 0   diclofenac (VOLTAREN) 75 MG EC tablet TAKE 1 TABLET 2 TIMES A DAY 180 tablet 0   diltiazem (CARDIZEM CD) 120 MG 24 hr capsule Take 1 capsule (120 mg total) by mouth daily. 90 capsule 1   Docusate Calcium (STOOL SOFTENER PO) Take by mouth daily.     escitalopram (LEXAPRO) 20 MG tablet TAKE 1 TABLET EVERY MORNING AND 1/2 EVERY EVENING 135 tablet 1    esomeprazole (NEXIUM) 40 MG capsule TAKE 1 CAPSULE ONCE DAILY 90 capsule 3   ezetimibe (ZETIA) 10 MG tablet Take 1 tablet (10 mg total) by mouth daily. 90 tablet 3   fenofibrate (TRICOR) 145 MG tablet TAKE ONE TABLET ONCE DAILY 30 tablet 3   fluticasone (FLONASE) 50 MCG/ACT nasal spray Place 2 sprays into both nostrils daily. 16 g 6   fluticasone furoate-vilanterol (BREO ELLIPTA) 100-25 MCG/INH AEPB Inhale 1 puff then rinse mouth, once daily 60 each 12   furosemide (LASIX) 20 MG tablet Take 1 tablet (20 mg total) by mouth daily. 90 tablet 1   glimepiride (AMARYL) 1 MG tablet TAKE ONE TABLET ONCE DAILY WITH BREAKFAST 30 tablet 3   glucose blood (ACCU-CHEK AVIVA) test strip Twice daily (please use brand that patient insurance covers) 100 each 5   Hypromellose (ARTIFICIAL TEARS OP) Place 1 drop into both eyes daily as needed (dry eyes).      levothyroxine (SYNTHROID) 200 MCG tablet Take 1 tablet (200 mcg total) by mouth daily. 30 tablet 3   liraglutide (VICTOZA) 18 MG/3ML SOPN Inject 1.2 mg into the skin daily. Start 0.63m SQ once a day for 7 days, then increase to 1.24mSQ once a day x 7 days and then 1.8 mg SQ daily 6 mL 0   loratadine (CLARITIN) 10 MG tablet Take 1 tablet (10 mg total) by mouth daily. 30 tablet 11   losartan (COZAAR) 100 MG tablet Take 1 tablet (100 mg total) by mouth daily. 90 tablet 1   meclizine (ANTIVERT) 25 MG tablet Take 25 mg by mouth every 6 (six) hours as needed.     metFORMIN (GLUCOPHAGE) 500  MG tablet TAKE 2 TABLETS 2 TIMES A DAY WITH MEALS 360 tablet 1   montelukast (SINGULAIR) 10 MG tablet Take 1 tablet (10 mg total) by mouth at bedtime. 90 tablet 1   rosuvastatin (CRESTOR) 40 MG tablet Take 1 tablet (40 mg total) by mouth daily. 90 tablet 1   TRUEPLUS PEN NEEDLES 31G X 6 MM MISC USE WITH INSULIN UP TO 4 TIMES A DAY AS NEEDED 300 each 1   Vitamin D, Ergocalciferol, (DRISDOL) 1.25 MG (50000 UNIT) CAPS capsule Take 1 capsule (50,000 Units total) by mouth every 7 (seven)  days. 4 capsule 4   Zoster Vaccine Adjuvanted [facility]) injection Inject into the muscle. 1 each 0   melatonin 5 MG TABS Take by mouth.     Semaglutide-Weight Management (WEGOVY) 0.25 MG/0.5ML SOAJ Inject 0.25 mg into the skin once a week. 2 mL 0   No facility-administered medications prior to visit.    Allergies  Allergen Reactions   No Known Allergies     Review of Systems  Constitutional:        (+) increased appetite specifically during the evening and into the night       Objective:    Physical Exam Constitutional:      General: She is not in acute distress.    Appearance: Normal appearance. She is not ill-appearing.  HENT:     Head: Normocephalic and atraumatic.     Right Ear: External ear normal.     Left Ear: External ear normal.  Eyes:     Extraocular Movements: Extraocular movements intact.     Pupils: Pupils are equal, round, and reactive to light.  Cardiovascular:     Rate and Rhythm: Normal rate and regular rhythm.     Heart sounds: Normal heart sounds. No murmur heard.   No gallop.  Pulmonary:     Effort: Pulmonary effort is normal. No respiratory distress.     Breath sounds: Normal breath sounds. No wheezing or rales.  Musculoskeletal:     Right ankle: Swelling (2+) present.     Left ankle: Swelling (2+) present.  Lymphadenopathy:     Cervical: No cervical adenopathy.  Skin:    General: Skin is warm and dry.  Neurological:     Mental Status: She is alert and oriented to person, place, and time.  Psychiatric:        Behavior: Behavior normal.        Judgment: Judgment normal.    BP (!) 151/82 (BP Location: Right Arm, Patient Position: Sitting, Cuff Size: Large)   Pulse 77   Temp 99.1 F (37.3 C) (Oral)   Resp 16   Ht 5' 5" (1.651 m)   Wt (!) 325 lb (147.4 kg)   SpO2 98%   BMI 54.08 kg/m  Wt Readings from Last 3 Encounters:  03/20/21 (!) 325 lb (147.4 kg)  01/29/21 (!) 331 lb (150.1 kg)  12/05/20 (!) 318 lb 12.8 oz (144.6 kg)        Assessment & Plan:   Problem List Items Addressed This Visit       Unprioritized   Uncontrolled type 2 diabetes mellitus with hyperglycemia ([place])    Will obtain follow up A1C. If still elevated, will plan referral to Endocrinology.  Of note, she has had significant weight gain over the last 1 year. Continue current meds.       Relevant Orders   Hemoglobin A1c   Comp Met (CMET)   Hypothyroidism    Lab  Results  Component Value Date   TSH 29.57 (H) 12/06/2020  Synthroid was increased back in mid June to 239mg. Will repeat TSH today.       Relevant Orders   TSH   Essential hypertension - Primary    BP Readings from Last 3 Encounters:  03/20/21 (!) 151/82  01/29/21 122/82  12/05/20 110/68  BP much better today than it was at her GI office.  She admits to being very stressed that day and also having white coat HTN. Please complete lab work prior to leaving. Continue current medications. Check bp at home and let uKoreaknow if bp is running >160/100.      Other Visit Diagnoses     Needs flu shot       Relevant Orders   Flu Vaccine QUAD High Dose(Fluad) (Completed)      No orders of the defined types were placed in this encounter.   ICordella Register NP, 03/20/2021, personally preformed the services described in this documentation.  All medical record entries made by the scribe were at my direction and in my presence.  I have reviewed the chart and discharge instructions (if applicable) and agree that the record reflects my personal performance and is accurate and complete. 03/20/2021  I,Lyric Barr-McArthur,acting as a scribe for MNance Pear NP.,have documented all relevant documentation on the behalf of MNance Pear NP,as directed by  MNance Pear NP while in the presence of MNance Pear NP.  MNance Pear NP

## 2021-03-20 NOTE — Patient Instructions (Signed)
Please complete lab work prior to leaving. Check bp at home and let us know if bp is running >160/100.

## 2021-03-20 NOTE — Assessment & Plan Note (Signed)
Lab Results  Component Value Date   TSH 29.57 (H) 12/06/2020   Synthroid was increased back in mid June to 23mcg. Will repeat TSH today.

## 2021-03-20 NOTE — Assessment & Plan Note (Addendum)
BP Readings from Last 3 Encounters:  03/20/21 (!) 151/82  01/29/21 122/82  12/05/20 110/68   BP much better today than it was at her GI office.  She admits to being very stressed that day and also having white coat HTN. Please complete lab work prior to leaving. Continue current medications. Check bp at home and let us know if bp is running >160/100.

## 2021-03-20 NOTE — Assessment & Plan Note (Signed)
Will obtain follow up A1C. If still elevated, will plan referral to Endocrinology.  Of note, she has had significant weight gain over the last 1 year. Continue current meds.

## 2021-03-21 ENCOUNTER — Telehealth: Payer: Self-pay | Admitting: Family

## 2021-03-21 ENCOUNTER — Encounter: Payer: Self-pay | Admitting: Family

## 2021-03-21 DIAGNOSIS — E039 Hypothyroidism, unspecified: Secondary | ICD-10-CM

## 2021-03-21 DIAGNOSIS — E1165 Type 2 diabetes mellitus with hyperglycemia: Secondary | ICD-10-CM

## 2021-03-21 MED ORDER — LEVOTHYROXINE SODIUM 25 MCG PO TABS: 25.0000 ug | ORAL_TABLET | Freq: Every day | ORAL | 0 refills | Status: DC

## 2021-03-21 NOTE — Telephone Encounter (Signed)
Patient advised of results, medication increase, referral to endo and possible fatty liver. She was also instructed on how to take the thyroid medication and scheduled to come back for labs on 05-07-21

## 2021-03-21 NOTE — Telephone Encounter (Signed)
Please advise pt that her A1C is down some (8.7), but it is still above goal. I would recommend referral to endocrinology as we discussed at her visit.  Also, Liver function tests are elevated. Most likely cause is fatty liver. Treatment for fatty liver is low fat diet, exercise and weight loss. I see that her GI doctor is having her complete an ultrasound of her liver on Friday which is good.  Thyroid medication still needs to be further increased.  I would like her to continue synthroid 285mcg and add 70mcg once daily.   Take thyroid medication in AM on empty stomach 30 minutes before other medication.   Repeat TSH in 6 weeks.

## 2021-03-22 ENCOUNTER — Encounter: Payer: Self-pay | Admitting: Hematology and Oncology

## 2021-03-23 ENCOUNTER — Encounter: Payer: Self-pay | Admitting: Hematology and Oncology

## 2021-03-23 ENCOUNTER — Ambulatory Visit
Admission: RE | Admit: 2021-03-23 | Discharge: 2021-03-23 | Disposition: A | Discharge status: Home / Self Care | Payer: Medicare Other | Source: Ambulatory Visit | Attending: Gastroenterology | Admitting: Gastroenterology

## 2021-03-23 DIAGNOSIS — R7989 Other specified abnormal findings of blood chemistry: Secondary | ICD-10-CM

## 2021-03-23 DIAGNOSIS — K760 Fatty (change of) liver, not elsewhere classified: Secondary | ICD-10-CM | POA: Diagnosis not present

## 2021-03-26 ENCOUNTER — Telehealth: Payer: Self-pay | Admitting: *Deleted

## 2021-03-26 NOTE — Telephone Encounter (Signed)
Left message on machine to call back.  We received a fax from [employer] for [employer].  Ozempic not on med list. Has she been taking since 06/2019

## 2021-03-27 NOTE — Telephone Encounter (Signed)
Spoke with patient and she was on the ozempic in the past and did well but the price was too high.  The nurses at [facility] are trying to get her help because she is on victoza now which is now expensive.  Do you want to start her back on starting dose or start her back on what she was on which was 0.5?

## 2021-03-28 ENCOUNTER — Other Ambulatory Visit: Payer: Self-pay | Admitting: *Deleted

## 2021-03-28 DIAGNOSIS — E1165 Type 2 diabetes mellitus with hyperglycemia: Secondary | ICD-10-CM

## 2021-03-28 MED ORDER — OZEMPIC (0.25 OR 0.5 MG/DOSE) 2 MG/1.5ML ~~LOC~~ SOPN: 0.2500 mg | PEN_INJECTOR | SUBCUTANEOUS | 2 refills | Status: DC

## 2021-03-28 NOTE — Telephone Encounter (Signed)
Form faxed back to nurse.

## 2021-03-30 ENCOUNTER — Other Ambulatory Visit: Payer: Self-pay | Admitting: Family Medicine

## 2021-04-11 NOTE — Progress Notes (Addendum)
Patient Care Team: [name], MD as PCP - General (Family Medicine) Sueanne Margarita, MD as PCP - Cardiology (Cardiology) Fanny Skates, MD as Consulting Physician (General Surgery) Nicholas Lose, MD as Consulting Physician (Hematology and Oncology) Gery Pray, MD as Consulting Physician (Radiation Oncology) Delice Bison, Charlestine Massed, NP as Nurse Practitioner (Hematology and Oncology) Michiel Cowboy, RN as [place] Management  DIAGNOSIS:    ICD-10-CM   1. Malignant neoplasm of upper-outer quadrant of left breast in female, estrogen receptor positive ([name])  C50.412    Z17.0       SUMMARY OF ONCOLOGIC HISTORY: Oncology History  Malignant neoplasm of upper-outer quadrant of left female breast ([name])  05/13/2016 Initial Diagnosis   Left breast biopsy 3:00: IDC, grade 3, ER 30%, PR 0%, Ki-67 40%, HER-2 positive ratio 2.71, screening detected left breast mass 9 x 7 x 6 mm, axilla negative, T1 BN 0 stage IA clinical stage   06/21/2016 Surgery   Left lumpectomy: IDC grade 3, 1.1 cm, 1/5 LN Positive with ECE, Margins Neg, LVI Present; Er 30%, PR 0%, Ki 67 40%, Her 2 Positive Ratio 2.71; T1CN1 (Stage 2B)   07/25/2016 - 01/09/2017 Chemotherapy   Taxol weekly 12; Herceptin and Perjeta every 3 weeks     10/31/2016 - 11/28/2016 Radiation Therapy   Adjuvant radiation therapy   12/2016 -  Anti-estrogen oral therapy   Anastrozole daily     CHIEF COMPLIANT: Follow-up of left breast cancer on anastrozole therapy  INTERVAL HISTORY: [name] is a 70 y.o. with above-mentioned history of left breast cancer treated with lumpectomy, adjuvant radiation, and who is currently on anastrozole. She presents to the clinic today for follow-up.  Denies any problems tolerating anastrozole therapy.  She denies any hot flashes or arthralgias or myalgias.  She had thyroidectomy and they detected thyroid cancer.  She did not require any treatment for that.  ALLERGIES:  is  allergic to no known allergies.  MEDICATIONS:  Current Outpatient Medications  Medication Sig Dispense Refill   acetaminophen (TYLENOL) 500 MG tablet Take 1,000 mg by mouth 2 (two) times daily as needed for moderate pain or headache.     albuterol (VENTOLIN HFA) 108 (90 Base) MCG/ACT inhaler Inhale 2 puffs every 4-6 hours as needed - rescue. 18 g 12   anastrozole (ARIMIDEX) 1 MG tablet Take 1 tablet (1 mg total) by mouth daily. 90 tablet 3   busPIRone (BUSPAR) 7.5 MG tablet Take 1 tablet (7.5 mg total) by mouth 3 (three) times daily. 90 tablet 3   carvedilol (COREG) 12.5 MG tablet Take 1 tablet 2 times daily with a meal. 180 tablet 1   COVID-19 mRNA vaccine, Moderna, 100 MCG/0.5ML injection Inject into the muscle. 0.25 mL 0   diclofenac (VOLTAREN) 75 MG EC tablet TAKE 1 TABLET 2 TIMES A DAY 180 tablet 0   diltiazem (CARDIZEM CD) 120 MG 24 hr capsule Take 1 capsule (120 mg total) by mouth daily. 90 capsule 1   Docusate Calcium (STOOL SOFTENER PO) Take by mouth daily.     escitalopram (LEXAPRO) 20 MG tablet TAKE 1 TABLET EVERY MORNING AND 1/2 EVERY EVENING 135 tablet 1   esomeprazole (NEXIUM) 40 MG capsule TAKE 1 CAPSULE ONCE DAILY 90 capsule 3   ezetimibe (ZETIA) 10 MG tablet Take 1 tablet (10 mg total) by mouth daily. 90 tablet 3   fenofibrate (TRICOR) 145 MG tablet TAKE ONE TABLET ONCE DAILY 30 tablet 3   fluticasone (FLONASE) 50 MCG/ACT  nasal spray Place 2 sprays into both nostrils daily. 16 g 6   fluticasone furoate-vilanterol (BREO ELLIPTA) 100-25 MCG/INH AEPB Inhale 1 puff then rinse mouth, once daily 60 each 12   furosemide (LASIX) 20 MG tablet Take 1 tablet (20 mg total) by mouth daily. 90 tablet 1   glimepiride (AMARYL) 1 MG tablet TAKE ONE TABLET ONCE DAILY WITH BREAKFAST 30 tablet 3   glucose blood (ACCU-CHEK AVIVA) test strip Twice daily (please use brand that patient insurance covers) 100 each 5   Hypromellose (ARTIFICIAL TEARS OP) Place 1 drop into both eyes daily as needed (dry  eyes).      levothyroxine (SYNTHROID) 200 MCG tablet Take 1 tablet (200 mcg total) by mouth daily. 30 tablet 3   levothyroxine (SYNTHROID) 25 MCG tablet Take 1 tablet (25 mcg total) by mouth daily. 90 tablet 0   loratadine (CLARITIN) 10 MG tablet Take 1 tablet (10 mg total) by mouth daily. 30 tablet 11   losartan (COZAAR) 100 MG tablet Take 1 tablet (100 mg total) by mouth daily. 90 tablet 1   meclizine (ANTIVERT) 25 MG tablet Take 25 mg by mouth every 6 (six) hours as needed.     metFORMIN (GLUCOPHAGE) 500 MG tablet TAKE 2 TABLETS 2 TIMES A DAY WITH MEALS 360 tablet 1   montelukast (SINGULAIR) 10 MG tablet Take 1 tablet (10 mg total) by mouth at bedtime. 90 tablet 1   rosuvastatin (CRESTOR) 40 MG tablet Take 1 tablet (40 mg total) by mouth daily. 90 tablet 1   Semaglutide,0.25 or 0.5MG/DOS, (OZEMPIC, 0.25 OR 0.5 MG/DOSE,) 2 MG/1.5ML SOPN Inject 0.25 mg into the skin once a week. 1.5 mL 2   TRUEPLUS PEN NEEDLES 31G X 6 MM MISC USE WITH INSULIN UP TO 4 TIMES A DAY AS NEEDED 300 each 1   Vitamin D, Ergocalciferol, (DRISDOL) 1.25 MG (50000 UNIT) CAPS capsule Take 1 capsule (50,000 Units total) by mouth every 7 (seven) days. 4 capsule 4   Zoster Vaccine Adjuvanted [facility]) injection Inject into the muscle. 1 each 0   No current facility-administered medications for this visit.    PHYSICAL EXAMINATION: ECOG PERFORMANCE STATUS: 1 - Symptomatic but completely ambulatory  Vitals:   04/12/21 1009  BP: (!) 153/89  Pulse: 80  Resp: 18  Temp: (!) 97.3 F (36.3 C)  SpO2: 96%   Filed Weights   04/12/21 1009  Weight: (!) 325 lb 4.8 oz (147.6 kg)    BREAST: No palpable masses or nodules in either right or left breasts. No palpable axillary supraclavicular or infraclavicular adenopathy no breast tenderness or nipple discharge. (exam performed in the presence of a chaperone)  LABORATORY DATA:  I have reviewed the data as listed CMP Latest Ref Rng & Units 03/20/2021 12/06/2020 09/05/2020  Glucose  70 - 99 mg/dL 175(H) 235(H) 196(H)  BUN 6 - 23 mg/dL 20 22 26(H)  Creatinine 0.40 - 1.20 mg/dL 0.85 0.99 1.03  Sodium 135 - 145 mEq/L 138 135 138  Potassium 3.5 - 5.1 mEq/L 4.7 4.6 4.8  Chloride 96 - 112 mEq/L 101 95(L) 100  CO2 19 - 32 mEq/L '25 25 28  ' Calcium 8.4 - 10.5 mg/dL 10.2 9.8 9.5  Total Protein 6.0 - 8.3 g/dL 7.3 7.5 7.1  Total Bilirubin 0.2 - 1.2 mg/dL 0.4 0.6 0.5  Alkaline Phos 39 - 117 U/L 78 70 79  AST 0 - 37 U/L 58(H) 44(H) 19  ALT 0 - 35 U/L 46(H) 49(H) 20    Lab  Results  Component Value Date   WBC 8.6 12/06/2020   HGB 11.2 (L) 12/06/2020   HCT 36.6 12/06/2020   MCV 78.1 12/06/2020   PLT 298.0 12/06/2020   NEUTROABS 5.2 06/19/2020    ASSESSMENT & PLAN:  Malignant neoplasm of upper-outer quadrant of left female breast ([place]) 06/21/16: Left lumpectomy: IDC grade 3, 1.1 cm, 1/5 LN Positive with ECE, Margins Neg, LVI Present; Er 30%, PR 0%, Ki 67 40%, Her 2 Positive Ratio 2.71; T1CN1 (Stage 2B)   Patient has multiple comorbidities including history of LVH with congestive heart failure: She has an echocardiogram coming up next month   Treatment Plan: 1. Adjuvant Taxol with Herceptin And Perjeta  (Last echocardiogram showed an ejection fraction of 55-60%-- repeat on 4/3/2018EF 60-65%) completed 01/09/2017 2. Followed by adjuvant radiation 3. Followed by adjuvant antiestrogen therapy with anastrozole 1 mg daily 5 years (bone density 04/29/2016 T score -1.2) ----------------------------------------------------------------------------------------------------------------------------------------------------- Current Treatment: Anastrozole 1 mg daily started 04/29/2016 Anastrozole toxicities: Denies any hot flashes or myalgias   Surveillance:  Mammogram  08/14/2020: Benign, breast density category B Breast exam: 04/12/2021: Benign   Severe shortness of breath to minimal exertion hospitalization for CSF leak at [facility] June 2021.  She now follows at [facility]. Thyroidectomy for goiter; detected thyroid cancer.  Currently on Synthroid 225 mg Return to clinic in 1 year for follow-up    No orders of the defined types were placed in this encounter.  The patient has a good understanding of the overall plan. she agrees with it. she will call with any problems that may develop before the next visit here.  Total time spent: 20 mins including face to face time and time spent for planning, charting and coordination of care  Rulon Eisenmenger, MD, MPH 04/12/2021  I, Thana Ates, am acting as scribe for Dr. Nicholas Lose.  I have reviewed the above documentation for accuracy and completeness, and I agree with the above.

## 2021-04-12 ENCOUNTER — Inpatient Hospital Stay: Payer: Medicare Other | Attending: Hematology and Oncology | Admitting: Hematology and Oncology

## 2021-04-12 ENCOUNTER — Other Ambulatory Visit: Payer: Self-pay

## 2021-04-12 DIAGNOSIS — Z79899 Other long term (current) drug therapy: Secondary | ICD-10-CM | POA: Diagnosis not present

## 2021-04-12 DIAGNOSIS — I509 Heart failure, unspecified: Secondary | ICD-10-CM | POA: Diagnosis not present

## 2021-04-12 DIAGNOSIS — C50412 Malignant neoplasm of upper-outer quadrant of left female breast: Secondary | ICD-10-CM

## 2021-04-12 DIAGNOSIS — Z9221 Personal history of antineoplastic chemotherapy: Secondary | ICD-10-CM | POA: Insufficient documentation

## 2021-04-12 DIAGNOSIS — Z170 Estrogen receptor positive status [ER+]: Secondary | ICD-10-CM

## 2021-04-12 DIAGNOSIS — Z923 Personal history of irradiation: Secondary | ICD-10-CM | POA: Insufficient documentation

## 2021-04-12 DIAGNOSIS — Z7984 Long term (current) use of oral hypoglycemic drugs: Secondary | ICD-10-CM | POA: Diagnosis not present

## 2021-04-12 DIAGNOSIS — Z79811 Long term (current) use of aromatase inhibitors: Secondary | ICD-10-CM | POA: Diagnosis not present

## 2021-04-12 DIAGNOSIS — C73 Malignant neoplasm of thyroid gland: Secondary | ICD-10-CM | POA: Diagnosis not present

## 2021-04-12 MED ORDER — ANASTROZOLE 1 MG PO TABS: 1.0000 mg | ORAL_TABLET | Freq: Every day | ORAL | 3 refills | Status: DC

## 2021-04-12 NOTE — Assessment & Plan Note (Signed)
06/21/16: Left lumpectomy: IDC grade 3, 1.1 cm, 1/5 LN Positive with ECE, Margins Neg, LVI Present; Er 30%, PR 0%, Ki 67 40%, Her 2 Positive Ratio 2.71; T1CN1 (Stage 2B)  Patient has multiple comorbidities including history of LVH with congestive heart failure:She has an echocardiogram coming up next month  Treatment Plan: 1. Adjuvant Taxol with Herceptin And Perjeta (Last echocardiogram showed an ejection fraction of 55-60%-- repeat on 4/3/2018EF 60-65%) completed 01/09/2017 2. Followed by adjuvant radiation 3. Followed by adjuvant antiestrogen therapy with anastrozole 1 mg daily 5 years (bone density 04/29/2016 T score -1.2) ----------------------------------------------------------------------------------------------------------------------------------------------------- Current Treatment:Anastrozole 1 mg daily started 04/29/2016 Anastrozole toxicities: Denies any hot flashes or myalgias  Surveillance: Mammogram  08/14/2020: Benign, breast density category B Breast exam: 04/12/2021: Benign  Severe shortness of breath to minimal exertion hospitalization for CSF leak at [facility] June 2021  Return to clinic in 1 year for follow-up

## 2021-04-17 ENCOUNTER — Other Ambulatory Visit: Payer: Self-pay | Admitting: Family Medicine

## 2021-04-25 ENCOUNTER — Other Ambulatory Visit: Payer: Self-pay | Admitting: Family Medicine

## 2021-05-03 ENCOUNTER — Telehealth: Payer: Self-pay | Admitting: *Deleted

## 2021-05-03 NOTE — Telephone Encounter (Signed)
Pt returned phone call and confirmed appointment for Monday 11/14 @ 9:15 and canceled 8 o'clock.

## 2021-05-03 NOTE — Telephone Encounter (Signed)
Pt has lab appts scheduled on Tuesday at 8am and 9:15am.  Left message for pt to return my call. Need to verify which time she is going to keep and then cancel the other appt.

## 2021-05-04 DIAGNOSIS — G4733 Obstructive sleep apnea (adult) (pediatric): Secondary | ICD-10-CM | POA: Diagnosis not present

## 2021-05-07 ENCOUNTER — Other Ambulatory Visit: Payer: Medicare Other

## 2021-05-07 ENCOUNTER — Ambulatory Visit: Payer: Medicare Other | Attending: Internal Medicine

## 2021-05-07 ENCOUNTER — Other Ambulatory Visit (INDEPENDENT_AMBULATORY_CARE_PROVIDER_SITE_OTHER): Payer: Medicare Other

## 2021-05-07 ENCOUNTER — Other Ambulatory Visit: Payer: Self-pay

## 2021-05-07 DIAGNOSIS — E039 Hypothyroidism, unspecified: Secondary | ICD-10-CM

## 2021-05-07 DIAGNOSIS — Z23 Encounter for immunization: Secondary | ICD-10-CM

## 2021-05-07 LAB — TSH: TSH: 21.15 u[IU]/mL — ABNORMAL HIGH (ref 0.35–5.50)

## 2021-05-07 NOTE — Progress Notes (Signed)
   Covid-19 Vaccination Clinic  Name:  [name]    MRN: [id number] DOB: [birth year]  05/07/2021  [name] was observed post Covid-19 immunization for 15 minutes without incident. She was provided with Vaccine Information Sheet and instruction to access the V-Safe system.   [name] was instructed to call 911 with any severe reactions post vaccine: Difficulty breathing  Swelling of face and throat  A fast heartbeat  A bad rash all over body  Dizziness and weakness   Immunizations Administered     Name Date Dose VIS Date Route   Moderna Covid-19 vaccine Bivalent Booster 05/07/2021  9:39 AM 0.5 mL 02/03/2021 Intramuscular   Manufacturer: Moderna   Lot: [id number]   [place]: [id number]

## 2021-05-08 ENCOUNTER — Other Ambulatory Visit: Payer: Self-pay | Admitting: Family

## 2021-05-08 DIAGNOSIS — E039 Hypothyroidism, unspecified: Secondary | ICD-10-CM

## 2021-05-08 NOTE — Telephone Encounter (Signed)
Thyroid testing is showing that she needs more thyroid medicine.  Please confirm that she has been taking synthroid 230mcg + 1mcg tabs daily.  If so, I would like to change it to 218mcg + 50 mcg once daily. She should take first thing in the AM on empty stomach, wait 30 minutes before taking any other medication or eating.   Repeat TSH 6 weeks, labs pended.

## 2021-05-08 NOTE — Telephone Encounter (Signed)
Lvm for patient to call back

## 2021-05-09 MED ORDER — LEVOTHYROXINE SODIUM 50 MCG PO TABS: 50.0000 ug | ORAL_TABLET | Freq: Every day | ORAL | 0 refills | Status: DC

## 2021-05-09 NOTE — Progress Notes (Signed)
HPI female never smoker followed for OSA, lung restriction, dyspnea on exertion, asthmatic bronchitis, complicated by chronic sinusitis, anxiety and depression, paroxysmal atrial tachycardia, CAD, breast cancer left, morbid obesity Unattended Home Sleep Test 11/05/13-AHI 68.1/hour, desaturation to 62%, body weight 320 pounds Echocardiogram 12/04/16- Nl EF Office Spirometry 02/06/17-moderate restriction of exhaled volume. FVC 2.02/61%, FEV1 1.75/69%, ratio 0.86, FEF 25-75% 2.45/113% PFT- 03/12/17-moderate obstructive airways disease with significant response to bronchodilator, normal diffusion. FVC 2.25/69%, FEV1 1.88/76%, ratio 0.84, TLC 80%, DLCO 86% --------------------------------------------------------------------------------------------.   06/19/20- 70 year old female never smoker followed for OSA, lung restriction, dyspnea on exertion, asthmatic bronchitis, complicated by chronic sinusitis, anxiety and depression, paroxysmal atrial tachycardia, CAD, breast cancer left, morbid obesity, Goiter, CSF leak treated at [facility] with ENT f/u at [facility],  CPAP auto 5-15/Adapt Albuterol hfa, Breo 100, singulair Download- 97%, AHI 2.1/ hr Body weight today- 312 lbs Covid vax-3 Moderna Flu vax- had Recently able to resume CPAP use after sgy for CSF leak. Considers CPAP a big help for sleep.  Asthma- . Notes DOE- ok at rest- since surgery in June. Some wheeze- rescue hfa does help. L leg swells. No chest pain.No hx VTE. Sees Dr Turner./ cardiology. Hx chemo and XRT L breast.   05/10/21- 70 year old female never smoker followed for OSA, lung restriction, dyspnea on exertion, asthmatic bronchitis, complicated by chronic sinusitis, anxiety and depression, paroxysmal atrial tachycardia, CAD, breast cancer left, morbid obesity, Goiter, CSF leak treated at [facility] with ENT f/u at [facility],  CPAP auto 5-15/Adapt -Albuterol hfa, Breo 100, singulair Download- 100%, AHI 2.2/ hr Body weight today- 322  lbs Covid vax-5 Moderna Flu vax- had ACT score 16 -----Patient did PFT today, states that her breathing is good some days and sometimes its not. Varies with weather. No wheeze or cough and no acute event. Not routinely feeling any need for inhalers.  Has avoided COVID infection. PFT 05/10/21- Mild Restriction . ROS-see HPI   + = Positive Constitutional:    +weight loss, night sweats, fevers, chills, fatigue, lassitude. HEENT:   + headaches, difficulty swallowing, tooth/dental problems, sore throat,       sneezing, itching, ear ache, + nasal congestion, post nasal drip, snoring CV:    chest pain, orthopnea, PND, swelling in lower extremities, anasarca,                                                  dizziness, palpitations Resp:   + shortness of breath with exertion or at rest.                productive cough,   non-productive cough, coughing up of blood.              change in color of mucus.  + wheezing.   Skin:    rash or lesions. GI:  No-   heartburn, indigestion, abdominal pain, nausea, vomiting, diarrhea,                 change in bowel habits, loss of appetite GU: dysuria, change in color of urine, no urgency or frequency.   flank pain. MS:   joint pain, stiffness, decreased range of motion, back pain. Neuro-     nothing unusual Psych:  change in mood or affect.  depression or anxiety.   memory loss.  OBJ- Physical Exam General- Alert, Oriented, Affect-appropriate,  Distress- none acute, + morbidly obese Skin- rash-none, lesions- none, excoriation- none Lymphadenopathy- none Head- atraumatic,             Eyes- Gross vision intact, PERRLA, conjunctivae and secretions clear            Ears- Hearing, canals-normal            Nose-  turbinate edema, no-Septal dev, mucus, polyps, erosion, perforation,            Throat- Mallampati III , mucosa clear , drainage- none, tonsils- atrophic, + stridor Neck- flexible , trachea midline , thyroid R mass identified as" goiter" , carotid no  bruit Chest - symmetrical excursion , unlabored           Heart/CV- RRR , no murmur , no gallop  , no rub, nl s1 s2                           - JVD- none , edema- none, stasis changes- none, varices- none           Lung- clear,  wheeze- none, cough- none , dullness-none, rub- none           Chest wall-  Abd-  Br/ Gen/ Rectal- Not done, not indicated Extrem- cyanosis- none, clubbing, none, atrophy- none, strength- nl Neuro- grossly intact to observation

## 2021-05-10 ENCOUNTER — Other Ambulatory Visit: Payer: Self-pay

## 2021-05-10 ENCOUNTER — Ambulatory Visit: Payer: Medicare Other | Admitting: Internal Medicine

## 2021-05-10 ENCOUNTER — Ambulatory Visit (INDEPENDENT_AMBULATORY_CARE_PROVIDER_SITE_OTHER): Payer: Medicare Other | Admitting: Internal Medicine

## 2021-05-10 ENCOUNTER — Encounter: Payer: Self-pay | Admitting: Internal Medicine

## 2021-05-10 DIAGNOSIS — J4520 Mild intermittent asthma, uncomplicated: Secondary | ICD-10-CM

## 2021-05-10 DIAGNOSIS — G4733 Obstructive sleep apnea (adult) (pediatric): Secondary | ICD-10-CM

## 2021-05-10 DIAGNOSIS — R0600 Dyspnea, unspecified: Secondary | ICD-10-CM | POA: Diagnosis not present

## 2021-05-10 LAB — PULMONARY FUNCTION TEST
DL/VA % pred: 118 %
DL/VA: 4.86 ml/min/mmHg/L
DLCO cor % pred: 87 %
DLCO cor: 17.67 ml/min/mmHg
DLCO unc % pred: 87 %
DLCO unc: 17.67 ml/min/mmHg
FEF 25-75 Post: 3.14 L/sec
FEF 25-75 Pre: 2.21 L/sec
FEF2575-%Change-Post: 41 %
FEF2575-%Pred-Post: 160 %
FEF2575-%Pred-Pre: 112 %
FEV1-%Change-Post: 3 %
FEV1-%Pred-Post: 77 %
FEV1-%Pred-Pre: 75 %
FEV1-Post: 1.84 L
FEV1-Pre: 1.77 L
FEV1FVC-%Change-Post: 0 %
FEV1FVC-%Pred-Pre: 114 %
FEV6-%Change-Post: 4 %
FEV6-%Pred-Post: 70 %
FEV6-%Pred-Pre: 67 %
FEV6-Post: 2.09 L
FEV6-Pre: 2 L
FEV6FVC-%Change-Post: 0 %
FEV6FVC-%Pred-Post: 104 %
FEV6FVC-%Pred-Pre: 104 %
FVC-%Change-Post: 2 %
FVC-%Pred-Post: 67 %
FVC-%Pred-Pre: 65 %
FVC-Post: 2.09 L
FVC-Pre: 2.04 L
Post FEV1/FVC ratio: 88 %
Post FEV6/FVC ratio: 100 %
Pre FEV1/FVC ratio: 87 %
Pre FEV6/FVC Ratio: 100 %
RV % pred: 81 %
RV: 1.82 L
TLC % pred: 76 %
TLC: 4 L

## 2021-05-10 NOTE — Patient Instructions (Signed)
You are doing very well with your CPAP. We can continue auto 5-15.  Your pulmonary function test showed only mild abnormalities. Ok not to use your inhalers if you don't find that you need them.  I really encourage you to look for ways to walk and move around more- it will help with shortness of breath and with your weight.

## 2021-05-10 NOTE — Progress Notes (Signed)
PFT done today. 

## 2021-05-25 ENCOUNTER — Other Ambulatory Visit (HOSPITAL_BASED_OUTPATIENT_CLINIC_OR_DEPARTMENT_OTHER): Payer: Self-pay

## 2021-05-25 MED ORDER — MODERNA COVID-19 BIVAL BOOSTER 50 MCG/0.5ML IM SUSP
INTRAMUSCULAR | 0 refills | Status: DC
  Filled 2021-05-25: qty 0.5, 1d supply, fill #0

## 2021-05-28 ENCOUNTER — Other Ambulatory Visit: Payer: Self-pay | Admitting: Family Medicine

## 2021-06-20 ENCOUNTER — Other Ambulatory Visit: Payer: Self-pay | Admitting: *Deleted

## 2021-06-20 NOTE — Patient Outreach (Signed)
[facility]) Care Management  06/20/2021  Nora Sabey [birth year] [id number]  Unsuccessful outreach attempt made to patient. Patient answered the phone and stated that she would not be able to speak today. Patient did request that this nurse call back at a later date.   Plan: RN Health Coach will call patient within the month of January.  Emelia Loron RN, BSN [place] [phone] [email or web address]

## 2021-06-22 ENCOUNTER — Other Ambulatory Visit (INDEPENDENT_AMBULATORY_CARE_PROVIDER_SITE_OTHER): Payer: Medicare Other

## 2021-06-22 DIAGNOSIS — E039 Hypothyroidism, unspecified: Secondary | ICD-10-CM

## 2021-06-22 LAB — TSH: TSH: 7.93 u[IU]/mL — ABNORMAL HIGH (ref 0.35–5.50)

## 2021-06-25 ENCOUNTER — Other Ambulatory Visit: Payer: Self-pay | Admitting: Family Medicine

## 2021-06-26 ENCOUNTER — Telehealth: Payer: Self-pay | Admitting: Family

## 2021-06-26 ENCOUNTER — Other Ambulatory Visit: Payer: Self-pay

## 2021-06-26 DIAGNOSIS — E039 Hypothyroidism, unspecified: Secondary | ICD-10-CM

## 2021-06-26 NOTE — Telephone Encounter (Signed)
TSH is improving.  Continue synthroid 294mcg + 50 mcg once daily. Take in AM on empty stomach, 30 minutes prior to other medications. Repeat TSH in 4 weeks.

## 2021-06-26 NOTE — Telephone Encounter (Signed)
Patient advised of results and provider's advise. She was scheduled to come back 07-24-21 for TSH. Order entered as future.

## 2021-07-02 ENCOUNTER — Encounter: Payer: Self-pay | Admitting: Internal Medicine

## 2021-07-02 NOTE — Assessment & Plan Note (Signed)
PFT this time shows only mild restriction, reflecting absence of active bronchospasm. Plan-okay to stay off inhalers if not needed

## 2021-07-02 NOTE — Assessment & Plan Note (Signed)
Benefits from CPAP with good compliance and control Plan- continue auto 5-15 

## 2021-07-06 ENCOUNTER — Other Ambulatory Visit: Payer: Self-pay | Admitting: *Deleted

## 2021-07-06 NOTE — Patient Outreach (Signed)
[facility]) Care Management  07/06/2021  [name] [birth year] [id number]  Unsuccessful outreach attempt made to patient. Patient answered the phone and stated that she would not be able to speak today because she is visiting her daughter in [place]. Patient did request that this nurse call back at a later date.   Plan: RN Health Coach will call patient within the month of February.  Emelia Loron RN, BSN [place] [phone] [name]

## 2021-07-16 ENCOUNTER — Encounter: Payer: Self-pay | Admitting: Family Medicine

## 2021-07-16 ENCOUNTER — Ambulatory Visit (INDEPENDENT_AMBULATORY_CARE_PROVIDER_SITE_OTHER): Payer: Medicare Other | Admitting: Family Medicine

## 2021-07-16 VITALS — BP 130/88 | HR 89 | Temp 98.0°F | Resp 16 | Ht 65.0 in | Wt 323.6 lb

## 2021-07-16 DIAGNOSIS — I10 Essential (primary) hypertension: Secondary | ICD-10-CM | POA: Diagnosis not present

## 2021-07-16 DIAGNOSIS — R7989 Other specified abnormal findings of blood chemistry: Secondary | ICD-10-CM

## 2021-07-16 DIAGNOSIS — R0789 Other chest pain: Secondary | ICD-10-CM | POA: Diagnosis not present

## 2021-07-16 DIAGNOSIS — E039 Hypothyroidism, unspecified: Secondary | ICD-10-CM

## 2021-07-16 DIAGNOSIS — E7800 Pure hypercholesterolemia, unspecified: Secondary | ICD-10-CM | POA: Diagnosis not present

## 2021-07-16 DIAGNOSIS — R32 Unspecified urinary incontinence: Secondary | ICD-10-CM

## 2021-07-16 DIAGNOSIS — E1165 Type 2 diabetes mellitus with hyperglycemia: Secondary | ICD-10-CM | POA: Diagnosis not present

## 2021-07-16 DIAGNOSIS — E559 Vitamin D deficiency, unspecified: Secondary | ICD-10-CM

## 2021-07-16 LAB — URINALYSIS
Bilirubin Urine: NEGATIVE
Ketones, ur: NEGATIVE
Leukocytes,Ua: NEGATIVE
Nitrite: NEGATIVE
Specific Gravity, Urine: 1.02 (ref 1.000–1.030)
Total Protein, Urine: NEGATIVE
Urine Glucose: NEGATIVE
Urobilinogen, UA: 0.2 (ref 0.0–1.0)
pH: 5.5 (ref 5.0–8.0)

## 2021-07-16 LAB — CBC
HCT: 34.9 % — ABNORMAL LOW (ref 36.0–46.0)
Hemoglobin: 10.6 g/dL — ABNORMAL LOW (ref 12.0–15.0)
MCHC: 30.3 g/dL (ref 30.0–36.0)
MCV: 78.4 fl (ref 78.0–100.0)
Platelets: 265 10*3/uL (ref 150.0–400.0)
RBC: 4.45 Mil/uL (ref 3.87–5.11)
RDW: 19 % — ABNORMAL HIGH (ref 11.5–15.5)
WBC: 7.2 10*3/uL (ref 4.0–10.5)

## 2021-07-16 LAB — LIPID PANEL
Cholesterol: 113 mg/dL (ref 0–200)
HDL: 32.7 mg/dL — ABNORMAL LOW (ref >39.00)
NonHDL: 80.11
Total CHOL/HDL Ratio: 3
Triglycerides: 212 mg/dL — ABNORMAL HIGH (ref 0.0–149.0)
VLDL: 42.4 mg/dL — ABNORMAL HIGH (ref 0.0–40.0)

## 2021-07-16 LAB — COMPREHENSIVE METABOLIC PANEL
ALT: 32 U/L (ref 0–35)
AST: 50 U/L — ABNORMAL HIGH (ref 0–37)
Albumin: 4.2 g/dL (ref 3.5–5.2)
Alkaline Phosphatase: 77 U/L (ref 39–117)
BUN: 18 mg/dL (ref 6–23)
CO2: 26 mEq/L (ref 19–32)
Calcium: 9.9 mg/dL (ref 8.4–10.5)
Chloride: 101 mEq/L (ref 96–112)
Creatinine, Ser: 0.77 mg/dL (ref 0.40–1.20)
GFR: 78.03 mL/min (ref >60.00)
Glucose, Bld: 146 mg/dL — ABNORMAL HIGH (ref 70–99)
Potassium: 4.6 mEq/L (ref 3.5–5.1)
Sodium: 140 mEq/L (ref 135–145)
Total Bilirubin: 0.5 mg/dL (ref 0.2–1.2)
Total Protein: 7.6 g/dL (ref 6.0–8.3)

## 2021-07-16 LAB — HEMOGLOBIN A1C: Hgb A1c MFr Bld: 8.6 % — ABNORMAL HIGH (ref 4.6–6.5)

## 2021-07-16 LAB — LDL CHOLESTEROL, DIRECT: Direct LDL: 56 mg/dL

## 2021-07-16 LAB — VITAMIN D 25 HYDROXY (VIT D DEFICIENCY, FRACTURES): VITD: 13.17 ng/mL — ABNORMAL LOW (ref 30.00–100.00)

## 2021-07-16 LAB — TSH: TSH: 8.97 u[IU]/mL — ABNORMAL HIGH (ref 0.35–5.50)

## 2021-07-16 MED ORDER — LEVOTHYROXINE SODIUM 200 MCG PO TABS: 200.0000 ug | ORAL_TABLET | Freq: Every day | ORAL | 1 refills | Status: DC

## 2021-07-16 MED ORDER — FENOFIBRATE 145 MG PO TABS: 145.0000 mg | ORAL_TABLET | Freq: Every day | ORAL | 0 refills | Status: DC

## 2021-07-16 MED ORDER — METFORMIN HCL 500 MG PO TABS: ORAL_TABLET | ORAL | 1 refills | Status: DC

## 2021-07-16 MED ORDER — CARVEDILOL 12.5 MG PO TABS: ORAL_TABLET | ORAL | 1 refills | Status: DC

## 2021-07-16 MED ORDER — EZETIMIBE 10 MG PO TABS: 10.0000 mg | ORAL_TABLET | Freq: Every day | ORAL | 3 refills | Status: DC

## 2021-07-16 MED ORDER — ESOMEPRAZOLE MAGNESIUM 40 MG PO CPDR
40.0000 mg | DELAYED_RELEASE_CAPSULE | Freq: Every day | ORAL | 3 refills | Status: DC
Start: 2021-07-16 — End: 2022-08-16

## 2021-07-16 NOTE — Assessment & Plan Note (Signed)
Well controlled, no changes to meds. Encouraged heart healthy diet such as the DASH diet and exercise as tolerated.  °

## 2021-07-16 NOTE — Assessment & Plan Note (Signed)
Patient describes intermittent episodes of what sounds like muscle cramping in her left axilla where she previously underwent lymph node dissection.  She has had 2 episodes in the last week where she describes a sense of muscle cramp or spasm in her left axilla which radiates to just above her left breast lasts a couple minutes and then resolves.  No associated symptoms such as shortness of breath crushing chest pain nausea or diaphoresis are noted.  She had intermittent and occasional episodes for about the last 6 months.  They do not awaken her and they are not noted in the specific pattern i.e. lying down standing up but she does tend to be active when it occurs.  The last 1 occurred while driving.  EKG today shows no acute concerns but is noted to be sinus rhythm with some poor R wave progression a left anterior fascicular block and a first-degree AV block.  He does have an appointment with cardiology Dr. [name] in the near future and she will seek more urgent care if symptoms worsen or become more frequent.

## 2021-07-16 NOTE — Progress Notes (Signed)
Subjective:   By signing my name below, I, Zite Okoli, attest that this documentation has been prepared under the direction and in the presence of [name], MD. 07/16/2021    [name], female    DOB: 11-Apr-1951, 71 y.o.   MRN: [id number]  Chief Complaint  Patient presents with   Follow-up    Pain in left underarm where nodule was removed    HPI Patient is in today for an office visit and f/u. No recent illnesses or ED visits.   She reports she has intermittent pain under her left arm that radiates to the edge of her breast for 6 months. Notes that it feels like  a cramp or spasm and is over in a couple of minutes. She had some nodes removed form her breast previously after being diagnosed with breast cancer. The cramps is not brought on by anything but happens when she is doing something with her arm such as driving. The pain is not present during today's visit.  EKG reading is normal  She uses Vitamin D pills but is unsure of the dosage.   She reports that since she took out her thyroid, she has been very hungry, tired and very itchy. Denies constipation and dehydration.   She has been using Victoza to manage weight loss but would like to try new medication.   She reports she has had urinary incontinence, frequency and urgency that happens more often at night. She would like to see a urogynecologist.   She will receive the second dose of the shingles vaccine soon.   Past Medical History:  Diagnosis Date   Anemia 04/05/2012   Anxiety    Anxiety state 09/11/2007   Qualifier: Diagnosis of  By: Sherren Mocha RN, Dorian Pod     Asthma    Atrial tachycardia, paroxysmal ([place])    Back pain 10/02/2014   Breast cancer ([place]) 02/17/2017   CAD (coronary artery disease), native coronary artery    remote Cath with nonobstructive ASCAD with 20% mid LAD, 20% prox left circ and 20% ostial RCA   Chicken pox as a child   Chronic diastolic CHF (congestive heart failure), NYHA  class 1 ([place])    Complication of anesthesia    pt states feels different in her body after anesthesia when waking up and also experiences a smell of burnt plastic for approx a wk    Constipation    Depression    Diabetes ([place])    Dilated aortic root ([place])    56m by echo 08/2020   Dysuria 02/17/2017   Elevated LFTs 04/05/2012   GERD (gastroesophageal reflux disease)    Goiter    Heart murmur    hx of one at birth    History of kidney stones    History of radiation therapy 10/31/16-12/17/16   left breast 45 Gy in 25 fractions, left breast boost 16 Gy in 8 fractions   Hx of colonic polyps    Hyperlipidemia    Hypertension    Insomnia 10/08/2016   Joint pain    Malignant ne[place] of upper-outer quadrant of left [place] breast [place] 05/14/[place]   not with patient   Measles as a child   Mumps as a child   OA (osteoarthritis) of knee 04/05/2012   Obesity 07/11/2014   PONV (postoperative nausea and vomiting)    Preventative health care 09/05/2013   PVC's (premature ventricular contractions) 05/02/2016   Shortness of breath dyspnea    walking distances /  climbing stairs   Sleep apnea 04/05/2012   Tinea corporis 02/23/2013   Type 2 diabetes mellitus with hyperglycemia ([place]) 12/31/2013   Vasomotor rhinitis 04/05/2012   Ventral hernia    Wheezing     Past Surgical History:  Procedure Laterality Date   BREAST LUMPECTOMY Left 2017   BREAST LUMPECTOMY WITH RADIOACTIVE SEED AND SENTINEL LYMPH NODE BIOPSY Left 06/21/2016   Procedure: LEFT BREAST LUMPECTOMY WITH RADIOACTIVE SEED AND SENTINEL LYMPH NODE BIOPSY, INJECT BLUE DYE LEFT BREAST;  Surgeon: Fanny Skates, MD;  Location: [facility];  Service: General;  Laterality: Left;   CARDIAC CATHETERIZATION     normal coroary arteries per patient   CARDIOVASCULAR STRESS TEST     10/12/2013   CESAREAN SECTION     X 3   CHOLECYSTECTOMY     COLONOSCOPY WITH PROPOFOL N/A 03/28/2015   Procedure: COLONOSCOPY WITH PROPOFOL;  Surgeon: Juanita Craver, MD;   Location: [facility];  Service: Endoscopy;  Laterality: N/A;   HERNIA REPAIR  02/08/11   ventral hernia   JOINT REPLACEMENT     bilateral   KNEE ARTHROSCOPY  05/2010   bilateral   LEFT AND RIGHT HEART CATHETERIZATION WITH CORONARY ANGIOGRAM N/A 11/08/2013   Procedure: LEFT AND RIGHT HEART CATHETERIZATION WITH CORONARY ANGIOGRAM;  Surgeon: [name], MD;  Location: [facility];  Service: Cardiovascular;  Laterality: N/A;   MENISCUS REPAIR  2009   MOUTH SURGERY     teeth implants   PORT-A-CATH REMOVAL N/A 01/24/2017   Procedure: REMOVAL PORT-A-CATH;  Surgeon: Fanny Skates, MD;  Location: [facility];  Service: General;  Laterality: N/A;   PORTACATH PLACEMENT Right 07/16/2016   Procedure: INSERTION PORT-A-CATH RIGHT INTERNAL JUGULAR WITH ULTRASOUND;  Surgeon: Fanny Skates, MD;  Location: [facility];  Service: General;  Laterality: Right;   REPAIR DURAL / CSF LEAK  2021   performed at [facility] in [place]  2011   left   TOTAL KNEE ARTHROPLASTY Right 05/10/2014   Procedure: RIGHT TOTAL KNEE ARTHROPLASTY;  Surgeon: Mauri Pole, MD;  Location: [facility];  Service: Orthopedics;  Laterality: Right;   TUBAL LIGATION     WISDOM TOOTH EXTRACTION  2000    Family History  Problem Relation Age of Onset   Thyroid disease Mother    Hyperlipidemia Mother    Depression Mother    Anxiety disorder Mother    Heart attack Father 80   Hypertension Father    Arthritis Father        RA   Coronary artery disease Father    High Cholesterol Father    Coronary artery disease Brother    Heart disease Brother    Cancer Maternal Aunt        colon   Cancer Maternal Grandmother        colon   Heart attack Maternal Grandfather    Alcohol abuse Paternal Grandfather     Social History   Socioeconomic History   Marital status: Divorced    Spouse name: Not on file   Number of children: 3   Years of education: Not on file   Highest education level: Not on file   Occupational History   Occupation: retired - [employer]  Tobacco Use   Smoking status: Never   Smokeless tobacco: Never  Vaping Use   Vaping Use: Never used  Substance and Sexual Activity   Alcohol use: Not Currently    Comment: rarely   Drug use: No  Sexual activity: Never  Other Topics Concern   Not on file  Social History Narrative   Not on file   Social Determinants of Health   Financial Resource Strain: Not on file  Food Insecurity: No Food Insecurity   Worried About Running Out of Food in the Last Year: Never true   Ran Out of Food in the Last Year: Never true  Transportation Needs: No Transportation Needs   Lack of Transportation (Medical): No   Lack of Transportation (Non-Medical): No  Physical Activity: Not on file  Stress: Not on file  Social Connections: Not on file  Intimate Partner Violence: Not on file    Outpatient Medications Prior to Visit  Medication Sig Dispense Refill   acetaminophen (TYLENOL) 500 MG tablet Take 1,000 mg by mouth 2 (two) times daily as needed for moderate pain or headache.     albuterol (VENTOLIN HFA) 108 (90 Base) MCG/ACT inhaler Inhale 2 puffs every 4-6 hours as needed - rescue. 18 g 12   anastrozole (ARIMIDEX) 1 MG tablet Take 1 tablet (1 mg total) by mouth daily. 90 tablet 3   busPIRone (BUSPAR) 7.5 MG tablet Take 1 tablet (7.5 mg total) by mouth 3 (three) times daily. 90 tablet 3   diclofenac (VOLTAREN) 75 MG EC tablet TAKE 1 TABLET 2 TIMES A DAY 180 tablet 0   diltiazem (CARDIZEM CD) 120 MG 24 hr capsule Take 1 capsule (120 mg total) by mouth daily. 90 capsule 1   Docusate Calcium (STOOL SOFTENER PO) Take by mouth daily.     escitalopram (LEXAPRO) 20 MG tablet TAKE 1 TABLET EVERY MORNING AND 1/2 EVERY EVENING 135 tablet 1   fluticasone (FLONASE) 50 MCG/ACT nasal spray Place 2 sprays into both nostrils daily. 16 g 6   fluticasone furoate-vilanterol (BREO ELLIPTA) 100-25 MCG/INH AEPB Inhale 1 puff then rinse mouth, once daily 60  each 12   furosemide (LASIX) 20 MG tablet Take 1 tablet (20 mg total) by mouth daily. 90 tablet 1   Hypromellose (ARTIFICIAL TEARS OP) Place 1 drop into both eyes daily as needed (dry eyes).      levothyroxine (SYNTHROID) 50 MCG tablet Take 1 tablet (50 mcg total) by mouth daily. 90 tablet 0   loratadine (CLARITIN) 10 MG tablet Take 1 tablet (10 mg total) by mouth daily. 30 tablet 11   losartan (COZAAR) 100 MG tablet Take 1 tablet (100 mg total) by mouth daily. 90 tablet 1   meclizine (ANTIVERT) 25 MG tablet Take 25 mg by mouth every 6 (six) hours as needed.     montelukast (SINGULAIR) 10 MG tablet Take 1 tablet (10 mg total) by mouth at bedtime. 90 tablet 1   rosuvastatin (CRESTOR) 40 MG tablet Take 1 tablet (40 mg total) by mouth daily. 90 tablet 1   TRUEPLUS PEN NEEDLES 31G X 6 MM MISC USE WITH INSULIN UP TO 4 TIMES A DAY AS NEEDED 300 each 1   Vitamin D, Ergocalciferol, (DRISDOL) 1.25 MG (50000 UNIT) CAPS capsule Take 1 capsule (50,000 Units total) by mouth every 7 (seven) days. 4 capsule 4   carvedilol (COREG) 12.5 MG tablet Take 1 tablet 2 times daily with a meal. 180 tablet 1   COVID-19 mRNA bivalent vaccine, Moderna, (MODERNA COVID-19 BIVAL BOOSTER) 50 MCG/0.5ML injection Inject into the muscle. 0.5 mL 0   COVID-19 mRNA vaccine, Moderna, 100 MCG/0.5ML injection Inject into the muscle. 0.25 mL 0   esomeprazole (NEXIUM) 40 MG capsule TAKE 1 CAPSULE ONCE DAILY 90 capsule  3   ezetimibe (ZETIA) 10 MG tablet Take 1 tablet (10 mg total) by mouth daily. 90 tablet 3   fenofibrate (TRICOR) 145 MG tablet TAKE ONE TABLET ONCE DAILY 90 tablet 0   levothyroxine (SYNTHROID) 200 MCG tablet TAKE 1 TABLET DAILY 30 tablet 1   metFORMIN (GLUCOPHAGE) 500 MG tablet TAKE 2 TABLETS 2 TIMES A DAY WITH MEALS 360 tablet 1   glimepiride (AMARYL) 1 MG tablet TAKE ONE TABLET ONCE DAILY WITH BREAKFAST 30 tablet 3   glucose blood (ACCU-CHEK AVIVA) test strip Twice daily (please use brand that patient insurance covers)  100 each 5   Zoster Vaccine Adjuvanted [facility]) injection Inject into the muscle. 1 each 0   No facility-administered medications prior to visit.    Allergies  Allergen Reactions   No Known Allergies     Review of Systems  Constitutional:  Negative for fever and malaise/fatigue.  HENT:  Negative for congestion.   Eyes:  Negative for redness.  Respiratory:  Negative for shortness of breath.   Cardiovascular:  Negative for chest pain, palpitations and leg swelling.  Gastrointestinal:  Negative for abdominal pain, blood in stool and nausea.  Genitourinary:  Positive for frequency and urgency. Negative for dysuria.  Musculoskeletal:  Negative for falls.       (+) pain under left arm   Skin:  Negative for rash.  Neurological:  Negative for dizziness, loss of consciousness and headaches.  Endo/Heme/Allergies:  Negative for polydipsia.  Psychiatric/Behavioral:  Negative for depression. The patient is not nervous/anxious.       Objective:    Physical Exam Constitutional:      General: She is not in acute distress.    Appearance: She is well-developed.  HENT:     Head: Normocephalic and atraumatic.  Eyes:     Conjunctiva/sclera: Conjunctivae normal.  Neck:     Thyroid: No thyromegaly.  Cardiovascular:     Rate and Rhythm: Normal rate and regular rhythm.     Heart sounds: Normal heart sounds. No murmur heard. Pulmonary:     Effort: Pulmonary effort is normal. No respiratory distress.     Breath sounds: Normal breath sounds.  Abdominal:     General: Bowel sounds are normal. There is no distension.     Palpations: Abdomen is soft. There is no mass.     Tenderness: There is no abdominal tenderness.  Musculoskeletal:     Cervical back: Neck supple.  Lymphadenopathy:     Cervical: No cervical adenopathy.  Skin:    General: Skin is warm and dry.  Neurological:     Mental Status: She is alert and oriented to person, place, and time.  Psychiatric:        Behavior: Behavior  normal.    BP 130/88    Pulse 89    Temp 98 F (36.7 C)    Resp 16    Ht _0  (1.651 m)    Wt (!) 323 lb 9.6 oz (146.8 kg)    SpO2 92%    BMI 53.85 kg/m  Wt Readings from Last 3 Encounters:  07/16/21 (!) 323 lb 9.6 oz (146.8 kg)  05/10/21 (!) 322 lb 12.8 oz (146.4 kg)  04/12/21 (!) 325 lb 4.8 oz (147.6 kg)    Diabetic Foot Exam - Simple   No data filed    Lab Results  Component Value Date   WBC 8.6 12/06/2020   HGB 11.2 (L) 12/06/2020   HCT 36.6 12/06/2020   PLT 298.0 12/06/2020  GLUCOSE 175 (H) 03/20/2021   CHOL 109 12/06/2020   TRIG 266.0 (H) 12/06/2020   HDL 31.40 (L) 12/06/2020   LDLDIRECT 50.0 12/06/2020   LDLCALC 67 06/02/2020   ALT 46 (H) 03/20/2021   AST 58 (H) 03/20/2021   NA 138 03/20/2021   K 4.7 03/20/2021   CL 101 03/20/2021   CREATININE 0.85 03/20/2021   BUN 20 03/20/2021   CO2 25 03/20/2021   TSH 7.93 (H) 06/22/2021   INR 1.01 05/03/2014   HGBA1C 8.7 (H) 03/20/2021    Lab Results  Component Value Date   TSH 7.93 (H) 06/22/2021   Lab Results  Component Value Date   WBC 8.6 12/06/2020   HGB 11.2 (L) 12/06/2020   HCT 36.6 12/06/2020   MCV 78.1 12/06/2020   PLT 298.0 12/06/2020   Lab Results  Component Value Date   NA 138 03/20/2021   K 4.7 03/20/2021   CHLORIDE 107 04/11/2017   CO2 25 03/20/2021   GLUCOSE 175 (H) 03/20/2021   BUN 20 03/20/2021   CREATININE 0.85 03/20/2021   BILITOT 0.4 03/20/2021   ALKPHOS 78 03/20/2021   AST 58 (H) 03/20/2021   ALT 46 (H) 03/20/2021   PROT 7.3 03/20/2021   ALBUMIN 4.0 03/20/2021   CALCIUM 10.2 03/20/2021   ANIONGAP 10 10/06/2017   EGFR >60 04/11/2017   GFR 69.46 03/20/2021   Lab Results  Component Value Date   CHOL 109 12/06/2020   Lab Results  Component Value Date   HDL 31.40 (L) 12/06/2020   Lab Results  Component Value Date   LDLCALC 67 06/02/2020   Lab Results  Component Value Date   TRIG 266.0 (H) 12/06/2020   Lab Results  Component Value Date   CHOLHDL 3 12/06/2020   Lab  Results  Component Value Date   HGBA1C 8.7 (H) 03/20/2021       Assessment & Plan:   Problem List Items Addressed This Visit     Hyperlipidemia - Primary   Relevant Medications   fenofibrate (TRICOR) 145 MG tablet   ezetimibe (ZETIA) 10 MG tablet   carvedilol (COREG) 12.5 MG tablet   Other Relevant Orders   Lipid panel   Essential hypertension    Well controlled, no changes to meds. Encouraged heart healthy diet such as the DASH diet and exercise as tolerated.       Relevant Medications   fenofibrate (TRICOR) 145 MG tablet   ezetimibe (ZETIA) 10 MG tablet   carvedilol (COREG) 12.5 MG tablet   Other Relevant Orders   CBC   Comprehensive metabolic panel   TSH   Elevated LFTs   Type 2 diabetes mellitus with hyperglycemia (HCC)    hgba1c acceptable, minimize simple carbs. Increase exercise as tolerated. Continue current meds      Relevant Medications   metFORMIN (GLUCOPHAGE) 500 MG tablet   Other Relevant Orders   Hemoglobin A1c   Vitamin D deficiency    Supplement and monitor      Relevant Medications   esomeprazole (NEXIUM) 40 MG capsule   Other Relevant Orders   VITAMIN D 25 Hydroxy (Vit-D Deficiency, Fractures)   Hypothyroidism    On Levothyroxine, continue to monitor      Relevant Medications   levothyroxine (SYNTHROID) 200 MCG tablet   carvedilol (COREG) 12.5 MG tablet   Atypical chest pain    Patient describes intermittent episodes of what sounds like muscle cramping in her left axilla where she previously underwent lymph node dissection.  She has  had 2 episodes in the last week where she describes a sense of muscle cramp or spasm in her left axilla which radiates to just above her left breast lasts a couple minutes and then resolves.  No associated symptoms such as shortness of breath crushing chest pain nausea or diaphoresis are noted.  She had intermittent and occasional episodes for about the last 6 months.  They do not awaken her and they are not noted in  the specific pattern i.e. lying down standing up but she does tend to be active when it occurs.  The last 1 occurred while driving.  EKG today shows no acute concerns but is noted to be sinus rhythm with some poor R wave progression a left anterior fascicular block and a first-degree AV block.  He does have an appointment with cardiology Dr. [name] in the near future and she will seek more urgent care if symptoms worsen or become more frequent.      Relevant Orders   EKG 12-Lead (Completed)   Urinary incontinence    Worsening, no dysuria or hematuria, worse at night. Check UA and culture and referred to urogynecology for further consideration could have multiple contributing conditions      Relevant Orders   Urinalysis   Urine Culture   Ambulatory referral to Urogynecology    Meds ordered this encounter  Medications   fenofibrate (TRICOR) 145 MG tablet    Sig: Take 1 tablet (145 mg total) by mouth daily.    Dispense:  90 tablet    Refill:  0   metFORMIN (GLUCOPHAGE) 500 MG tablet    Sig: TAKE 2 TABLETS 2 TIMES A DAY WITH MEALS    Dispense:  360 tablet    Refill:  1   ezetimibe (ZETIA) 10 MG tablet    Sig: Take 1 tablet (10 mg total) by mouth daily.    Dispense:  90 tablet    Refill:  3   esomeprazole (NEXIUM) 40 MG capsule    Sig: Take 1 capsule (40 mg total) by mouth daily.    Dispense:  90 capsule    Refill:  3   levothyroxine (SYNTHROID) 200 MCG tablet    Sig: Take 1 tablet (200 mcg total) by mouth daily.    Dispense:  90 tablet    Refill:  1    PATIENT WANTS A 90 DAY SUPPLY, THANK YOU   carvedilol (COREG) 12.5 MG tablet    Sig: Take 1 tablet 2 times daily with a meal.    Dispense:  180 tablet    Refill:  1    I,Zite Okoli,acting as a scribe for Penni Homans, MD.,have documented all relevant documentation on the behalf of Penni Homans, MD,as directed by  Penni Homans, MD while in the presence of Penni Homans, MD.   I, [name], MD., personally preformed the  services described in this documentation.  All medical record entries made by the scribe were at my direction and in my presence.  I have reviewed the chart and discharge instructions (if applicable) and agree that the record reflects my personal performance and is accurate and complete. 07/16/2021

## 2021-07-16 NOTE — Assessment & Plan Note (Signed)
[id number] acceptable, minimize simple carbs. Increase exercise as tolerated. Continue current meds 

## 2021-07-16 NOTE — Patient Instructions (Addendum)
For muscle spasm try Lidocaine gel, Biofreeze and/or CBD daily cream Hydrate Alternate heat and ice Notify cardiology if worsens and/or seek immediate care   Call for the cardiology and oncology appointments  Shingrix is the new shingles shot, 2 shots over 2-6 months, confirm coverage with insurance and document, then can return here for shots with nurse appt or at pharmacy    [facility] is a shot for diabetes that helps weight loss, consider Ozempic

## 2021-07-16 NOTE — Assessment & Plan Note (Signed)
Worsening, no dysuria or hematuria, worse at night. Check UA and culture and referred to urogynecology for further consideration could have multiple contributing conditions

## 2021-07-16 NOTE — Assessment & Plan Note (Signed)
Supplement and monitor 

## 2021-07-16 NOTE — Assessment & Plan Note (Signed)
On Levothyroxine, continue to monitor 

## 2021-07-18 ENCOUNTER — Other Ambulatory Visit: Payer: Self-pay

## 2021-07-18 DIAGNOSIS — D649 Anemia, unspecified: Secondary | ICD-10-CM

## 2021-07-18 LAB — URINE CULTURE
MICRO NUMBER:: 12905480
SPECIMEN QUALITY:: ADEQUATE

## 2021-07-18 MED ORDER — CEFDINIR 300 MG PO CAPS: 300.0000 mg | ORAL_CAPSULE | Freq: Two times a day (BID) | ORAL | 0 refills | Status: AC

## 2021-07-18 MED ORDER — VITAMIN D (ERGOCALCIFEROL) 1.25 MG (50000 UNIT) PO CAPS: 50000.0000 [IU] | ORAL_CAPSULE | ORAL | 4 refills | Status: DC

## 2021-07-18 NOTE — Addendum Note (Signed)
Addended by: Randolm Idol A on: 07/18/2021 08:51 AM   Modules accepted: Orders

## 2021-07-19 ENCOUNTER — Other Ambulatory Visit: Payer: Self-pay

## 2021-07-19 ENCOUNTER — Ambulatory Visit: Payer: Medicare Other | Admitting: Internal Medicine

## 2021-07-19 ENCOUNTER — Encounter: Payer: Self-pay | Admitting: Internal Medicine

## 2021-07-19 VITALS — BP 144/90 | HR 86 | Ht 65.0 in | Wt 321.8 lb

## 2021-07-19 DIAGNOSIS — E1165 Type 2 diabetes mellitus with hyperglycemia: Secondary | ICD-10-CM | POA: Diagnosis not present

## 2021-07-19 DIAGNOSIS — E890 Postprocedural hypothyroidism: Secondary | ICD-10-CM | POA: Diagnosis not present

## 2021-07-19 DIAGNOSIS — E1159 Type 2 diabetes mellitus with other circulatory complications: Secondary | ICD-10-CM | POA: Insufficient documentation

## 2021-07-19 DIAGNOSIS — Z85850 Personal history of malignant neoplasm of thyroid: Secondary | ICD-10-CM | POA: Diagnosis not present

## 2021-07-19 MED ORDER — OZEMPIC (0.25 OR 0.5 MG/DOSE) 2 MG/1.5ML ~~LOC~~ SOPN: 0.5000 mg | PEN_INJECTOR | SUBCUTANEOUS | 5 refills | Status: DC

## 2021-07-19 NOTE — Patient Instructions (Addendum)
Please continue: - Metformin 1000 mg 2x a day, with meals - Glimepiride 1 mg before breakfast  Please start Ozempic 0.25 mg weekly in a.m. (for example on Sunday morning) x 3-4 weeks, then increase to 0.5 mg weekly in a.m. if no nausea or hypoglycemia.  Continue Levothyroxine 250 mcg daily.  Take the thyroid hormone every day, with water, at least 30 minutes before breakfast, separated by at least 4 hours from: - acid reflux medications - calcium - iron - multivitamins  Please move Nexium >4h after Levothyroxine.  If you start Probiotics, tae them >2h after Levothyroxine.  Please return in 1.5 months with your sugar log.   PATIENT INSTRUCTIONS FOR TYPE 2 DIABETES:  DIET AND EXERCISE Diet and exercise is an important part of diabetic treatment.  We recommended aerobic exercise in the form of brisk walking (working between 40-60% of maximal aerobic capacity, similar to brisk walking) for 150 minutes per week (such as 30 minutes five days per week) along with 3 times per week performing 'resistance' training (using various gauge rubber tubes with handles) 5-10 exercises involving the major muscle groups (upper body, lower body and core) performing 10-15 repetitions (or near fatigue) each exercise. Start at half the above goal but build slowly to reach the above goals. If limited by weight, joint pain, or disability, we recommend daily walking in a swimming pool with water up to waist to reduce pressure from joints while allow for adequate exercise.    BLOOD GLUCOSES Monitoring your blood glucoses is important for continued management of your diabetes. Please check your blood glucoses 2-4 times a day: fasting, before meals and at bedtime (you can rotate these measurements - e.g. one day check before the 3 meals, the next day check before 2 of the meals and before bedtime, etc.).   HYPOGLYCEMIA (low blood sugar) Hypoglycemia is usually a reaction to not eating, exercising, or taking too much  insulin/ other diabetes drugs.  Symptoms include tremors, sweating, hunger, confusion, headache, etc. Treat IMMEDIATELY with 15 grams of Carbs: 4 glucose tablets  cup regular juice/soda 2 tablespoons raisins 4 teaspoons sugar 1 tablespoon honey Recheck blood glucose in 15 mins and repeat above if still symptomatic/blood glucose <100.  RECOMMENDATIONS TO REDUCE YOUR RISK OF DIABETIC COMPLICATIONS: * Take your prescribed MEDICATION(S) * Follow a DIABETIC diet: Complex carbs, fiber rich foods, (monounsaturated and polyunsaturated) fats * AVOID saturated/trans fats, high fat foods, >2,300 mg salt per day. * EXERCISE at least 5 times a week for 30 minutes or preferably daily.  * DO NOT SMOKE OR DRINK more than 1 drink a day. * Check your FEET every day. Do not wear tightfitting shoes. Contact us if you develop an ulcer * See your EYE doctor once a year or more if needed * Get a FLU shot once a year * Get a PNEUMONIA vaccine once before and once after age 28 years  GOALS:  * Your Hemoglobin A1c of <7%  * fasting sugars need to be <130 * after meals sugars need to be <180 (2h after you start eating) * Your Systolic BP should be 597 or lower  * Your Diastolic BP should be 80 or lower  * Your HDL (Good Cholesterol) should be 40 or higher  * Your LDL (Bad Cholesterol) should be 100 or lower. * Your Triglycerides should be 150 or lower  * Your Urine microalbumin (kidney function) should be <30 * Your Body Mass Index should be 25 or lower  Please consider the following ways to cut down carbs and fat and increase fiber and micronutrients in your diet: - substitute whole grain for white bread or pasta - substitute brown rice for white rice - substitute 90-calorie flat bread pieces for slices of bread when possible - substitute sweet potatoes or yams for white potatoes - substitute humus for margarine - substitute tofu for cheese when possible - substitute almond or rice milk for regular  milk (would not drink soy milk daily due to concern for soy estrogen influence on breast cancer risk) - substitute dark chocolate for other sweets when possible - substitute water - can add lemon or orange slices for taste - for diet sodas (artificial sweeteners will trick your body that you can eat sweets without getting calories and will lead you to overeating and weight gain in the long run) - do not skip breakfast or other meals (this will slow down the metabolism and will result in more weight gain over time)  - can try smoothies made from fruit and almond/rice milk in am instead of regular breakfast - can also try old-fashioned (not instant) oatmeal made with almond/rice milk in am - order the dressing on the side when eating salad at a restaurant (pour less than half of the dressing on the salad) - eat as little meat as possible - can try juicing, but should not forget that juicing will get rid of the fiber, so would alternate with eating raw veg./fruits or drinking smoothies - use as little oil as possible, even when using olive oil - can dress a salad with a mix of balsamic vinegar and lemon juice, for e.g. - use agave nectar, stevia sugar, or regular sugar rather than artificial sweateners - steam or broil/roast veggies  - snack on veggies/fruit/nuts (unsalted, preferably) when possible, rather than processed foods - reduce or eliminate aspartame in diet (it is in diet sodas, chewing gum, etc) Read the labels!  Try to read Dr. Janene Harvey book: "Program for Reversing Diabetes" for other ideas for healthy eating.

## 2021-07-19 NOTE — Progress Notes (Signed)
[name], female   DOB: 05/29/1951, 71 y.o.   MRN: [id number]  This visit occurred during the SARS-CoV-2 public health emergency.  Safety protocols were in place, including screening questions prior to the visit, additional usage of staff PPE, and extensive cleaning of exam room while observing appropriate contact time as indicated for disinfecting solutions.   HPI: [name] is a 71 y.o.-year-old female, referred by her PCP, Dr. Charlett Blake, for management of DM2, dx in 2019 after her breast cancer treatment, non-insulin-dependent, uncontrolled, with complications (CAD, dCHF). She is here with her daughter who offers part of the history patient's medical history and symptoms..  DM2:  Reviewed HbA1c: Lab Results  Component Value Date   HGBA1C 8.6 (H) 07/16/2021   HGBA1C 8.7 (H) 03/20/2021   HGBA1C 9.7 (H) 12/06/2020   HGBA1C 8.3 (H) 09/05/2020   HGBA1C 9.5 (H) 06/02/2020   HGBA1C 7.0 (H) 09/06/2019   HGBA1C 6.3 (H) 07/02/2018   HGBA1C 9.5 (H) 03/26/2018   HGBA1C 8.7 (H) 12/22/2017   HGBA1C 7.5 (H) 09/22/2017   Pt is on a regimen of: - Metformin 1000 mg 2x a day, with meals - Glimepiride 1 mg with breakfast - Victoza 1.2 mg at bedtime- as she had abdominal pain with the 1.8 mg dose She was previously on Ozempic  [facility].  He was tolerating it well but she had to stop it when she stopped going to the weight management clinic during the coronavirus pandemic.  Pt checks her sugars seldom: - am: 118-122 - 2h after b'fast: n/c - before lunch: n/c - 2h after lunch: n/c - before dinner: n/c - 2h after dinner: n/c - bedtime: n/c - nighttime: n/c Lowest sugar was >100; ? hypoglycemia awareness. Highest sugar was 200 - in the hospital.  Glucometer: Accu-Chek  Pt's meals are: - Breakfast: eggs, sometimes sandwich with sliced [place], yoghurt - Lunch: sandwich or taco with [place] - Dinner: green bean + onion soup sauce + potato +  grilled chicken - Snacks: yasso bars, cucumber Diet green tea/soda.  - no CKD, last BUN/creatinine:  Lab Results  Component Value Date   BUN 18 07/16/2021   BUN 20 03/20/2021   CREATININE 0.77 07/16/2021   CREATININE 0.85 03/20/2021  She is is on losartan 100 mg daily.  -+ HL; last set of lipids: Lab Results  Component Value Date   CHOL 113 07/16/2021   HDL 32.70 (L) 07/16/2021   LDLCALC 67 06/02/2020   LDLDIRECT 56.0 07/16/2021   TRIG 212.0 (H) 07/16/2021   CHOLHDL 3 07/16/2021  She is on Crestor 40 mg daily, Zetia 10 mg daily.  - last eye exam was in Summer 2022. No DR reportedly.+ cataracts.   - no numbness and tingling in her feet.  Pt has FH of DM in [place].  She also has a history of postsurgical hypothyroidism:  She is on levothyroxine 250 mcg daily: - in am - fasting - at least 30-60 min from b'fast - no calcium - no iron - no multivitamins - + PPIs (Nexium) 1h after Levothyroxine! - not on Biotin  Reviewed previous TFTs: Lab Results  Component Value Date   TSH 8.97 (H) 07/16/2021   TSH 7.93 (H) 06/22/2021   TSH 21.15 (H) 05/07/2021   TSH 21.89 (H) 03/20/2021   TSH 29.57 (H) 12/06/2020   TSH 12.31 (A) 08/04/2020   TSH 1.84 09/06/2019   TSH 2.130 03/26/2018   TSH 1.87 12/22/2017   TSH  0.81 09/22/2017   TSH 1.31 05/22/2017   TSH 1.84 02/17/2017   TSH 0.95 05/02/2016   TSH 1.22 10/16/2015   TSH 0.89 04/17/2015   TSH 1.20 10/13/2014   TSH 1.34 07/11/2014   TSH 0.557 12/31/2013   TSH 0.997 09/02/2013   TSH 0.864 02/23/2013   Lab Results  Component Value Date   FREET4 1.33 03/26/2018   No FH of thyroid cancer. No h/o radiation tx to head or neck.  No recent contrast studies. No herbal supplements. No Biotin use. No recent steroids use.   She also has a history of a goiter >> s/p thyroidectomy 06/2020 >> papillary thyroid cancer  I reviewed the report of the neck CT (06/22/2020): Thyroid: Markedly diffusely enlarged and heterogeneous with  multiple poorly defined nodules. This extends into the superior aspect of the superior mediastinum. At the thoracic inlet, this is causing moderate narrowing of the trachea with a transverse diameter of 9 mm. This measured 6 mm on the chest CTA.  A thyroid U/S (04/29/2016) showed no individual nodules: Parenchymal Echotexture: Markedly heterogenous Estimated total number of nodules >/= 1 cm: 0 Isthmus: 1.3 cm; Heterogeneous parenchyma, enlarged. Right lobe: 11.1 cm x 5.2 cm x 4.6 cm; Heterogeneous lobulated parenchyma with no focal nodule. Left lobe: 9.0 cm x 3.6 cm x 6.0 cm; Heterogeneous lobulated parenchyma with no focal nodule.   IMPRESSION: Enlarged multilobulated thyroid compatible with multinodular goiter and medical thyroid disease. No focal nodule identified.  Total thyroidectomy (07/24/2020): A: Substernal thyroid, total substernal thyroidectomy - Multinodular goiter, clinically substernal. - Incidental microscopic papillary thyroid carcinoma, 1 mm in greatest dimension, margins uninvolved. - See synoptic template below. - One lymph node from soft tissue adjacent to thyroid gland with no tumor seen (0/1).   B: Thyroid, pyramidal lobe tissue, excision - Benign thyroid tissue with adenomatous nodule, no tumor seen.   She also has a history of HTN, GERD, osteoarthritis, anxiety/depression, vitamin D deficiency, breast cancer, urinary incontinence, frequency, urgency, transaminitis, OSA -on CPAP   No personal h/o pancreatitis or FH of MTC.  ROS: Constitutional: no weight gain, no weight loss, + fatigue, + subjective hyperthermia, no subjective hypothermia, + nocturia, + increased urination, + poor sleep Eyes: + Intermittent blurry vision, no xerophthalmia ENT: + sore throat (at night-CPAP), no dysphagia, no odynophagia, no hoarseness, no tinnitus, + hypoacusis Cardiovascular: no CP, + SOB, no palpitations, + leg swelling Respiratory: no cough,  + SOB, no  wheezing Gastrointestinal: no N, no V, no D, no C, + acid reflux Musculoskeletal: + muscle, no joint aches Skin: no rash, no hair loss Neurological: no tremors, no numbness or tingling/no dizziness/no HAs Psychiatric: + Both anxiety, depression  Past Medical History:  Diagnosis Date   Anemia 04/05/2012   Anxiety    Anxiety state 09/11/2007   Qualifier: Diagnosis of  By: Sherren Mocha RN, Dorian Pod     Asthma    Atrial tachycardia, paroxysmal ([place])    Back pain 10/02/2014   Breast cancer ([place]) 02/17/2017   CAD (coronary artery disease), native coronary artery    remote Cath with nonobstructive ASCAD with 20% mid LAD, 20% prox left circ and 20% ostial RCA   Chicken pox as a child   Chronic diastolic CHF (congestive heart failure), NYHA class 1 (HCC)    Complication of anesthesia    pt states feels different in her body after anesthesia when waking up and also experiences a smell of burnt plastic for approx a wk    Constipation  Depression    Diabetes ([place])    Dilated aortic root ([place])    13m by echo 08/2020   Dysuria 02/17/2017   Elevated LFTs 04/05/2012   GERD (gastroesophageal reflux disease)    Goiter    Heart murmur    hx of one at birth    History of kidney stones    History of radiation therapy 10/31/16-12/17/16   left breast 45 Gy in 25 fractions, left breast boost 16 Gy in 8 fractions   Hx of colonic polyps    Hyperlipidemia    Hypertension    Insomnia 10/08/2016   Joint pain    Malignant neoplasm of upper-outer quadrant of left female breast ([facility] 05/14/2016   not with patient   Measles as a child   Mumps as a child   OA (osteoarthritis) of knee 04/05/2012   Obesity 07/11/2014   PONV (postoperative nausea and vomiting)    Preventative health care 09/05/2013   PVC's (premature ventricular contractions) 05/02/2016   Shortness of breath dyspnea    walking distances / climbing stairs   Sleep apnea 04/05/2012   Tinea corporis 02/23/2013   Type 2 diabetes mellitus with hyperglycemia  ([facility] 12/31/2013   Vasomotor rhinitis 04/05/2012   Ventral hernia    Wheezing    Past Surgical History:  Procedure Laterality Date   BREAST LUMPECTOMY Left 2017   BREAST LUMPECTOMY WITH RADIOACTIVE SEED AND SENTINEL LYMPH NODE BIOPSY Left 06/21/2016   Procedure: LEFT BREAST LUMPECTOMY WITH RADIOACTIVE SEED AND SENTINEL LYMPH NODE BIOPSY, INJECT BLUE DYE LEFT BREAST;  Surgeon: HFanny Skates MD;  [facility]  Service: General;  Laterality: Left;   CARDIAC CATHETERIZATION     normal coroary arteries per patient   CARDIOVASCULAR STRESS TEST     10/12/2013   CESAREAN SECTION     X 3   CHOLECYSTECTOMY     COLONOSCOPY WITH PROPOFOL N/A 03/28/2015   Procedure: COLONOSCOPY WITH PROPOFOL;  Surgeon: JJuanita Craver MD;  [facility];  Service: Endoscopy;  Laterality: N/A;   HERNIA REPAIR  02/08/11   ventral hernia   JOINT REPLACEMENT     bilateral   KNEE ARTHROSCOPY  05/2010   bilateral   LEFT AND RIGHT HEART CATHETERIZATION WITH CORONARY ANGIOGRAM N/A 11/08/2013   Procedure: LEFT AND RIGHT HEART CATHETERIZATION WITH CORONARY ANGIOGRAM;  Surgeon: CBurnell Blanks MD;  [facility];  Service: Cardiovascular;  Laterality: N/A;   MENISCUS REPAIR  2009   MOUTH SURGERY     teeth implants   PORT-A-CATH REMOVAL N/A 01/24/2017   Procedure: REMOVAL PORT-A-CATH;  Surgeon: IFanny Skates MD;  [facility]  Service: General;  Laterality: N/A;   PORTACATH PLACEMENT Right 07/16/2016   Procedure: INSERTION PORT-A-CATH RIGHT INTERNAL JUGULAR WITH ULTRASOUND;  Surgeon: HFanny Skates MD;  [facility]  Service: General;  Laterality: Right;   REPAIR DURAL / CSF LEAK  2021   [facility]   left   TOTAL KNEE ARTHROPLASTY Right 05/10/2014   Procedure: RIGHT TOTAL KNEE ARTHROPLASTY;  Surgeon: MMauri Pole MD;  [facility];  Service: Orthopedics;  Laterality: Right;   TUBAL LIGATION     WISDOM TOOTH  EXTRACTION  2000   Social History   Socioeconomic History   Marital status: Divorced    Spouse name: Not on file   Number of children: 3   Years of education: Not on file   Highest education  level: Not on file  Occupational History   Occupation: retired - [employer]  Tobacco Use   Smoking status: Never   Smokeless tobacco: Never  Vaping Use   Vaping Use: Never used  Substance and Sexual Activity   Alcohol use: Not Currently    Comment: rarely   Drug use: No   Sexual activity: Never  Other Topics Concern   Not on file  Social History Narrative   Not on file   Social Determinants of Health   Financial Resource Strain: Not on file  Food Insecurity: No Food Insecurity   Worried About Running Out of Food in the Last Year: Never true   [place] in the Last Year: Never true  Transportation Needs: No Transportation Needs   Lack of Transportation (Medical): No   Lack of Transportation (Non-Medical): No  Physical Activity: Not on file  Stress: Not on file  Social Connections: Not on file  Intimate Partner Violence: Not on file   Current Outpatient Medications on File Prior to Visit  Medication Sig Dispense Refill   acetaminophen (TYLENOL) 500 MG tablet Take 1,000 mg by mouth 2 (two) times daily as needed for moderate pain or headache.     albuterol (VENTOLIN HFA) 108 (90 Base) MCG/ACT inhaler Inhale 2 puffs every 4-6 hours as needed - rescue. 18 g 12   anastrozole (ARIMIDEX) 1 MG tablet Take 1 tablet (1 mg total) by mouth daily. 90 tablet 3   busPIRone (BUSPAR) 7.5 MG tablet Take 1 tablet (7.5 mg total) by mouth 3 (three) times daily. 90 tablet 3   carvedilol (COREG) 12.5 MG tablet Take 1 tablet 2 times daily with a meal. 180 tablet 1   cefdinir (OMNICEF) 300 MG capsule Take 1 capsule (300 mg total) by mouth 2 (two) times daily for 5 days. 10 capsule 0   diclofenac (VOLTAREN) 75 MG EC tablet TAKE 1 TABLET 2 TIMES A DAY 180 tablet 0   diltiazem (CARDIZEM CD) 120 MG 24  hr capsule Take 1 capsule (120 mg total) by mouth daily. 90 capsule 1   Docusate Calcium (STOOL SOFTENER PO) Take by mouth daily.     escitalopram (LEXAPRO) 20 MG tablet TAKE 1 TABLET EVERY MORNING AND 1/2 EVERY EVENING 135 tablet 1   esomeprazole (NEXIUM) 40 MG capsule Take 1 capsule (40 mg total) by mouth daily. 90 capsule 3   ezetimibe (ZETIA) 10 MG tablet Take 1 tablet (10 mg total) by mouth daily. 90 tablet 3   fenofibrate (TRICOR) 145 MG tablet Take 1 tablet (145 mg total) by mouth daily. 90 tablet 0   fluticasone (FLONASE) 50 MCG/ACT nasal spray Place 2 sprays into both nostrils daily. 16 g 6   fluticasone furoate-vilanterol (BREO ELLIPTA) 100-25 MCG/INH AEPB Inhale 1 puff then rinse mouth, once daily 60 each 12   furosemide (LASIX) 20 MG tablet Take 1 tablet (20 mg total) by mouth daily. 90 tablet 1   glimepiride (AMARYL) 1 MG tablet TAKE ONE TABLET ONCE DAILY WITH BREAKFAST 30 tablet 3   glucose blood (ACCU-CHEK AVIVA) test strip Twice daily (please use brand that patient insurance covers) 100 each 5   Hypromellose (ARTIFICIAL TEARS OP) Place 1 drop into both eyes daily as needed (dry eyes).      levothyroxine (SYNTHROID) 200 MCG tablet Take 1 tablet (200 mcg total) by mouth daily. 90 tablet 1   levothyroxine (SYNTHROID) 50 MCG tablet Take 1 tablet (50 mcg total) by mouth daily. 90 tablet 0  loratadine (CLARITIN) 10 MG tablet Take 1 tablet (10 mg total) by mouth daily. 30 tablet 11   losartan (COZAAR) 100 MG tablet Take 1 tablet (100 mg total) by mouth daily. 90 tablet 1   meclizine (ANTIVERT) 25 MG tablet Take 25 mg by mouth every 6 (six) hours as needed.     metFORMIN (GLUCOPHAGE) 500 MG tablet TAKE 2 TABLETS 2 TIMES A DAY WITH MEALS 360 tablet 1   montelukast (SINGULAIR) 10 MG tablet Take 1 tablet (10 mg total) by mouth at bedtime. 90 tablet 1   rosuvastatin (CRESTOR) 40 MG tablet Take 1 tablet (40 mg total) by mouth daily. 90 tablet 1   TRUEPLUS PEN NEEDLES 31G X 6 MM MISC USE WITH  INSULIN UP TO 4 TIMES A DAY AS NEEDED 300 each 1   Vitamin D, Ergocalciferol, (DRISDOL) 1.25 MG (50000 UNIT) CAPS capsule Take 1 capsule (50,000 Units total) by mouth every 7 (seven) days. 4 capsule 4   No current facility-administered medications on file prior to visit.   Allergies  Allergen Reactions   No Known Allergies    Family History  Problem Relation Age of Onset   Thyroid disease Mother    Hyperlipidemia Mother    Depression Mother    Anxiety disorder Mother    Heart attack Father 20   Hypertension Father    Arthritis Father        RA   Coronary artery disease Father    High Cholesterol Father    Coronary artery disease Brother    Heart disease Brother    Cancer Maternal Aunt        colon   Cancer Maternal Grandmother        colon   Heart attack Maternal Grandfather    Alcohol abuse Paternal Grandfather     PE: BP (!) 144/90 (BP Location: Right Arm, Patient Position: Sitting, Cuff Size: Normal)    Pulse 86    Ht '5\' 5"'  (1.651 m)    Wt (!) 321 lb 12.8 oz (146 kg)    SpO2 99%    BMI 53.55 kg/m  Wt Readings from Last 3 Encounters:  07/19/21 (!) 321 lb 12.8 oz (146 kg)  07/16/21 (!) 323 lb 9.6 oz (146.8 kg)  05/10/21 (!) 322 lb 12.8 oz (146.4 kg)   Constitutional: obese, in NAD Eyes: PERRLA, EOMI, no exophthalmos ENT: moist mucous membranes, no thyromegaly, no cervical lymphadenopathy Cardiovascular: RRR, No MRG,  + B mild periankle edema Respiratory: CTA B Gastrointestinal: abdomen soft, NT, ND, BS+ Musculoskeletal: no deformities, strength intact in all 4 Skin: moist, warm, no rashes Neurological: no tremor with outstretched hands, DTR normal in all 4  ASSESSMENT: 1. DM2, non-insulin-dependent, uncontrolled, with complications - CAD - dCHF  2.  Postsurgical hypothyroidism  3.  Microscopic papillary thyroid cancer  PLAN:  1. Patient with long-standing, uncontrolled diabetes, on oral antidiabetic regimen, which became insufficient.  His HbA1c was 8.6%  earlier this month, previously 8.7%. -Patient is not checking her blood sugars lately but she does have a glucometer at home.  Several months ago, when she was checking, they were at goal in am, so it is conceivable that her sugars increase later in the day, after her meals.  We discussed about the importance of checking blood sugars and I advised her to start taking once a day, rotating check times.  I gave her sugar log. -she continues on Metformin at target dose, which she tolerates well.  We will continue this.  She is also on glimepiride, which he takes after breakfast and we discussed that this is a medication that needs to be taken approximately 15 minutes before she eats.  It is difficult for her to take it in advance of breakfast since she needs to take levothyroxine 30 minutes before, so I advised her to at least move glimepiride at the start of breakfast if not 30 minutes before.  I am hoping that we can stop this in the near future.  She takes Victoza at night and we discussed that this is a medication that is taken before breakfast to cover her meals during the day. I advised her to move this to before b'fast for now, but I would prefer to switch to a weekly GLP-1 receptor agonist.  She had good tolerance to Ozempic before and she agrees to try this again.  The biggest obstacle for her in using this is the price.  On my end, it appears to be the same tear with Victoza.  She mentions that he does not usually cheaper for her at the beginning of the year but towards the end she has to take more than $200 for it.  We discussed that if this happens and she tolerates it well and has good results with it, we may try the patient assistance program for [employer] if she qualifies.  She and her daughter agreed to the plan. -I also gave him written instructions about healthier dietary changes - I suggested to:  Patient Instructions  Please continue: - Metformin 1000 mg 2x a day, with meals - Glimepiride  1 mg before breakfast  Please start Ozempic 0.25 mg weekly in a.m. (for example on Sunday morning) x 3-4 weeks, then increase to 0.5 mg weekly in a.m. if no nausea or hypoglycemia.  Continue Levothyroxine 250 mcg daily.  Take the thyroid hormone every day, with water, at least 30 minutes before breakfast, separated by at least 4 hours from: - acid reflux medications - calcium - iron - multivitamins  Please move Nexium >4h after Levothyroxine.  If you start Probiotics, tae them >2h after Levothyroxine.  Please return in 1.5 months with your sugar log.   - discussed about CBG targets for treatment: 80-130 mg/dL before meals and <180 mg/dL after meals; target HbA1c <7%. - given sugar log and advised how to fill it and to bring it at next appt  - given foot care handout and explained the principles  - given instructions for hypoglycemia management "15-15 rule"  - advised for yearly eye exams  - Return to clinic in 1.5 mo with sugar log   2.  Postsurgical hypothyroidism - latest thyroid labs reviewed with pt. >> TSH was still elevated 3 days ago: Lab Results  Component Value Date   TSH 8.97 (H) 07/16/2021  - she continues on a fairly high dose of LT4, 250 mcg daily - pt feels tired on this dose - we discussed about taking the thyroid hormone every day, with water, >30 minutes before breakfast, separated by >4 hours from acid reflux medications, calcium, iron, multivitamins. Pt. is taking Nexium too close to levothyroxine.  I advised her to move this 4 hours later.  For now, we will continue the same dose of LT4 a - will check thyroid tests at next visit  3.  Microscopic papillary thyroid cancer -1 focus of 1 mm PTC per review of records from [place] incidentally on pathology after  removal of compressive goiter -I discussed with  patient and her daughter that she is most likely cured and no intervention is needed. -We will continue to monitor her clinically  - Total  time spent for the visit: 1 hour, in obtaining medical information from the Epic chart, Care Everywhere records, and also from the pt. And her daughter; also  reviewing her  previous labs, imaging evaluations, and treatments, reviewing her symptoms, counseling her about her conditions (please see the discussed topics above), and developing a plan to further investigate and treat them; they had a number of questions which I addressed.  Philemon Kingdom, MD PhD [facility] Endocrinology

## 2021-07-24 ENCOUNTER — Other Ambulatory Visit: Payer: Medicare Other

## 2021-08-01 ENCOUNTER — Other Ambulatory Visit: Payer: Medicare Other

## 2021-08-03 DIAGNOSIS — G4733 Obstructive sleep apnea (adult) (pediatric): Secondary | ICD-10-CM | POA: Diagnosis not present

## 2021-08-07 ENCOUNTER — Other Ambulatory Visit: Payer: Self-pay

## 2021-08-07 ENCOUNTER — Telehealth: Payer: Self-pay | Admitting: Family Medicine

## 2021-08-07 DIAGNOSIS — E119 Type 2 diabetes mellitus without complications: Secondary | ICD-10-CM

## 2021-08-07 MED ORDER — GLUCOSE BLOOD VI STRP: ORAL_STRIP | 5 refills | Status: DC

## 2021-08-07 NOTE — Telephone Encounter (Signed)
sent 

## 2021-08-07 NOTE — Telephone Encounter (Signed)
Medication: glucose blood (ACCU-CHEK AVIVA) test strip  Has the patient contacted their pharmacy? Yes.   They do not have the prescription  Preferred Pharmacy (with phone number or street name):  [facility], [address]  [place], [place] [address]  Phone:  [phone]  Fax:  [phone]   Agent: Please be advised that RX refills may take up to 3 business days. We ask that you follow-up with your pharmacy.

## 2021-08-08 ENCOUNTER — Other Ambulatory Visit: Payer: Self-pay

## 2021-08-08 MED ORDER — BLOOD GLUCOSE METER KIT: PACK | 0 refills | Status: AC

## 2021-08-08 NOTE — Telephone Encounter (Signed)
Pt is requesting a new diabetes supply kit that will work with her aviva test strips. Please advise.   Preferred Pharmacy:  [facility], [address]  [place], [place] [address]  Phone:  [phone]  Fax:  [phone]

## 2021-08-08 NOTE — Telephone Encounter (Signed)
Pt got everything she needed

## 2021-08-08 NOTE — Telephone Encounter (Signed)
Patient's requesting phone call when rx is sent to pharmacy. Patient doesn't want to drive to [facility] until she knows for sure rx has been sent to pharmacy.

## 2021-08-10 ENCOUNTER — Other Ambulatory Visit: Payer: Self-pay | Admitting: *Deleted

## 2021-08-10 NOTE — Patient Instructions (Signed)
Visit Information  Thank you for taking time to visit with me today. Please don't hesitate to contact me if I can be of assistance to you before our next scheduled telephone appointment.  Following are the goals we discussed today:  Patient Goals/Self-Care Activities: Take all medications as prescribed Attend all scheduled provider appointments Call provider office for new concerns or questions  keep appointment with eye doctor check blood sugar at prescribed times: once daily check feet daily for cuts, sores or redness enter blood sugar readings and medication or insulin into daily log take the blood sugar log to all doctor visits set goal weight trim toenails straight across drink 6 to 8 glasses of water each day eat fish at least once per week fill half of plate with vegetables limit fast food meals to no more than 1 per week manage portion size prepare main meal at home 3 to 5 days each week set a realistic goal wear comfortable, well-fitting shoes check blood pressure daily write blood pressure results in a log or diary learn about high blood pressure take blood pressure log to all doctor appointments call doctor for signs and symptoms of high blood pressure take medications for blood pressure exactly as prescribed limit salt intake to 2300mg /day Do Chair exercises every Tuesday and Thursday Use the [employer] dial a dietitian to assist you with diabetes, hypertension, and weight loss diet plan; [phone] Code: 106  The patient verbalized understanding of instructions, educational materials, and care plan provided today and agreed to receive a mailed copy of patient instructions, educational materials, and care plan.   Telephone follow up appointment with care management team member scheduled for: May  Danasia Baker Livingston RN, [place] [phone] [email or web address]

## 2021-08-10 NOTE — Patient Outreach (Signed)
Previous note deleted new note written 08/13/21.  Emelia Loron RN, [place] [phone] [email or web address]

## 2021-08-12 NOTE — Patient Outreach (Deleted)
[place] [facility]) Care Management  08/12/2021  [name] 1951-03-30 [id number]  [place] [facility]) Care Management RN Health Coach Note   08/10/21 Name:  [name] MRN:  [id number] DOB:  05/11/1951  Summary: Patient states she is doing fairly well.  She had a visit with Dr. Cruzita Lederer on 07/19/21 and is feeling more motivated about lowering her A1c. Patient explained that her blood sugar ranges are 140-200 and she has begun to make healthier food choices. Patient reports that her B/P ranges have been good at home. Her goals are to increase her physical activity, continue to make better food choices, and lower her A1c and B/P for her overall health and wellness. Patient did not have any further questions or concerns today and did confirm that she has this nurse's contact number to call her if needed.   Recommendations/Changes made from today's visit: Do Chair exercises every Tuesday and Thursday Use the [employer] dial a dietitian to assist you with diabetes, hypertension, and weight loss diet plan; [phone] Code: [phone] Monitor your blood sugar and B/P daily and record the values Contact your providers for questions or concerns  Subjective: [name] is an 71 y.o. year old female who is a primary patient of [name], MD. The care management team was consulted for assistance with care management and/or care coordination needs.    RN Health Coach completed Telephone Visit today.   Objective:  Medications Reviewed Today     Reviewed by Michiel Cowboy, RN (Registered Nurse) on 08/10/21 at [place] List Status: <None>   Medication Order Taking? Sig Documenting Provider Last Dose Status Informant  acetaminophen (TYLENOL) 500 MG tablet [id number] Yes Take 1,000 mg by mouth 2 (two) times daily as needed for moderate pain or headache. [provider] Taking Active Self  albuterol (VENTOLIN HFA) 108 (90 Base) MCG/ACT inhaler [id number]  Yes Inhale 2 puffs every 4-6 hours as needed - rescue. Deneise Lever, MD Taking Active   anastrozole (ARIMIDEX) 1 MG tablet [id number] Yes Take 1 tablet (1 mg total) by mouth daily. Nicholas Lose, MD Taking Active   blood glucose meter kit and supplies [id number] Yes Dispense based on patient and insurance preference. Use up to four times daily as directed. (FOR ICD-10 E10.9, E11.9). [name], MD Taking Active   busPIRone (BUSPAR) 7.5 MG tablet [id number] Yes Take 1 tablet (7.5 mg total) by mouth 3 (three) times daily. [name], MD Taking Active   carvedilol (COREG) 12.5 MG tablet [id number] Yes Take 1 tablet 2 times daily with a meal. [name], MD Taking Active   diclofenac (VOLTAREN) 75 MG EC tablet [id number] Yes TAKE 1 TABLET 2 TIMES A DAY [name], MD Taking Active   diltiazem (CARDIZEM CD) 120 MG 24 hr capsule [id number] Yes Take 1 capsule (120 mg total) by mouth daily. [name], MD Taking Active   Docusate Calcium (STOOL SOFTENER PO) [id number] Yes Take by mouth daily. [provider] Taking Active   escitalopram (LEXAPRO) 20 MG tablet [id number] Yes TAKE 1 TABLET EVERY MORNING AND 1/2 EVERY EVENING [name], MD Taking Active   esomeprazole (NEXIUM) 40 MG capsule [id number] Yes Take 1 capsule (40 mg total) by mouth daily. [name], MD Taking Active   ezetimibe (ZETIA) 10 MG tablet [id number] Yes Take 1 tablet (10 mg total) by mouth daily. [name], MD Taking Active   fenofibrate (TRICOR) 145 MG tablet  [id number] Yes Take 1 tablet (145 mg total) by mouth daily. [name], MD Taking Active   fluticasone Asencion Islam) 50 MCG/ACT nasal spray [id number] Yes Place 2 sprays into both nostrils daily. [name], MD Taking Active   fluticasone furoate-vilanterol (BREO ELLIPTA) 100-25 MCG/INH AEPB [id number] Yes Inhale 1 puff then rinse mouth, once daily Young, Clinton D, MD Taking Active   furosemide (LASIX) 20 MG tablet [id number] Yes Take 1  tablet (20 mg total) by mouth daily. [name], MD Taking Active   glimepiride (AMARYL) 1 MG tablet [id number] Yes TAKE ONE TABLET ONCE DAILY WITH BREAKFAST [name], MD Taking Active   glucose blood (ACCU-CHEK AVIVA) test strip [id number] Yes Twice daily (please use brand that patient insurance covers) [name], MD Taking Active   Hypromellose (ARTIFICIAL TEARS OP) [id number] Yes Place 1 drop into both eyes daily as needed (dry eyes).  [provider] Taking Active Self  levothyroxine (SYNTHROID) 200 MCG tablet [id number] Yes Take 1 tablet (200 mcg total) by mouth daily. [name], MD Taking Active   levothyroxine (SYNTHROID) 50 MCG tablet [id number] Yes Take 1 tablet (50 mcg total) by mouth daily. Debbrah Alar, NP Taking Active   loratadine (CLARITIN) 10 MG tablet [id number] Yes Take 1 tablet (10 mg total) by mouth daily. [name], MD Taking Active   losartan (COZAAR) 100 MG tablet [id number] Yes Take 1 tablet (100 mg total) by mouth daily. [name], MD Taking Active   meclizine (ANTIVERT) 25 MG tablet [id number] Yes Take 25 mg by mouth every 6 (six) hours as needed. [provider] Taking Active   metFORMIN (GLUCOPHAGE) 500 MG tablet [id number] Yes TAKE 2 TABLETS 2 TIMES A DAY WITH MEALS [name], MD Taking Active   montelukast (SINGULAIR) 10 MG tablet [id number] Yes Take 1 tablet (10 mg total) by mouth at bedtime. [name], MD Taking Active   rosuvastatin (CRESTOR) 40 MG tablet [id number] Yes Take 1 tablet (40 mg total) by mouth daily. [name], MD Taking Active   Semaglutide,0.25 or 0.5MG/DOS, (OZEMPIC, 0.25 OR 0.5 MG/DOSE,) 2 MG/1.5ML SOPN [id number] Yes Inject 0.5 mg into the skin once a week. Philemon Kingdom, MD Taking Active   TRUEPLUS PEN NEEDLES 31G X 6 MM MISC [id number] Yes USE WITH INSULIN UP TO 4 TIMES A DAY AS NEEDED [name], MD Taking Active   Vitamin D, Ergocalciferol, (DRISDOL) 1.25 MG (50000 UNIT)  CAPS capsule [id number] Yes Take 1 capsule (50,000 Units total) by mouth every 7 (seven) days. [name], MD Taking Active              SDOH:  (Social Determinants of Health) assessments and interventions performed:    Care Plan  Review of patient past medical history, allergies, medications, health status, including review of consultants reports, laboratory and other test data, was performed as part of comprehensive evaluation for care management services.   Care Plan : RN Care Manager Plan of Care  Updates made by Michiel Cowboy, RN since 08/12/2021 12:00 AM     Problem: Knowledge Deficit Related to Diabetes and Hypertension   Priority: High     Long-Range Goal: Development of Plan of Care for the Management of Diabetes and Hypertension   Start Date: 08/10/2021  Expected End Date: 08/21/2022  Note:   Current Barriers:  Chronic Disease Management support and education needs related to HTN and DMII   RNCM Clinical Goal(s):  Patient will  demonstrate Improved adherence to prescribed treatment plan for HTN and DMII as evidenced by monitoring blood sugar and B/P daily; lowering A1c gradually to goal of <7; maintaining B/P < 150/90; contacting providers for questions and concerns continue to work with RN Care Manager to address care management and care coordination needs related to  HTN and DMII as evidenced by adherence to CM Team Scheduled appointments work with pharmacist to address polypharmacy and education of current medications related tobest time to take medications, side effects, any interactions as evidenced by review or EMR and patient or pharmacist report through collaboration with Consulting civil engineer, provider, and care team.   Interventions: Inter-disciplinary care team collaboration (see longitudinal plan of care) Evaluation of current treatment plan related to self management and patient's adherence to plan as established by provider   Diabetes Interventions:  (Status:   Goal on track:  Yes.) Long Term Goal Assessed patient's understanding of A1c goal: <7% Provided education to patient about basic DM disease process Reviewed medications with patient and discussed importance of medication adherence Counseled on importance of regular laboratory monitoring as prescribed Discussed plans with patient for ongoing care management follow up and provided patient with direct contact information for care management team Advised patient, providing education and rationale, to check cbg daily and record, calling PCP for findings outside established parameters Referral made to pharmacy team for assistance with polypharmacy and education regarding medications Screening for signs and symptoms of depression related to chronic disease state  Assessed social determinant of health barriers Provided patient with dial a dietitian form [employer] to receive education by RD  Encouraged patient to do chair exercises 2 times a week; discussed using You-Tube Lab Results  Component Value Date   HGBA1C 8.6 (H) 07/16/2021   Hypertension Interventions:  (Status:  Goal on track:  Yes.) Long Term Goal Last practice recorded BP readings:  BP Readings from Last 3 Encounters:  07/19/21 (!) 144/90  07/16/21 130/88  05/10/21 130/90  Most recent eGFR/CrCl:  Lab Results  Component Value Date   EGFR >60 04/11/2017    No components found for: CRCL  Evaluation of current treatment plan related to hypertension self management and patient's adherence to plan as established by provider Reviewed medications with patient and discussed importance of compliance Discussed plans with patient for ongoing care management follow up and provided patient with direct contact information for care management team Advised patient, providing education and rationale, to monitor blood pressure daily and record, calling PCP for findings outside established parameters Advised patient to discuss blood pressure goal with  provider Provided education on prescribed diet low sodium Discussed complications of poorly controlled blood pressure such as heart disease, stroke, circulatory complications, vision complications, kidney impairment, sexual dysfunction  Patient Goals/Self-Care Activities: Take all medications as prescribed Attend all scheduled provider appointments Call provider office for new concerns or questions  keep appointment with eye doctor check blood sugar at prescribed times: once daily check feet daily for cuts, sores or redness enter blood sugar readings and medication or insulin into daily log take the blood sugar log to all doctor visits set goal weight trim toenails straight across drink 6 to 8 glasses of water each day eat fish at least once per week fill half of plate with vegetables limit fast food meals to no more than 1 per week manage portion size prepare main meal at home 3 to 5 days each week set a realistic goal wear comfortable, well-fitting shoes check blood pressure daily write  blood pressure results in a log or diary learn about high blood pressure take blood pressure log to all doctor appointments call doctor for signs and symptoms of high blood pressure take medications for blood pressure exactly as prescribed limit salt intake to 2317m/day Do Chair exercises every Tuesday and Thursday Use the [employer] dial a dietitian to assist you with diabetes, hypertension, and weight loss diet plan; [phone]: [phone]  Follow Up Plan:  Telephone follow up appointment with care management team member scheduled for:  May        Plan: Telephone follow up appointment with care management team member scheduled for:  May  . Nurse provided patient with [employer] dial a dietitian resource, will send patient a planning healthy meals booklet, will refer patient to pharmacy for polypharmacy and education, and will send PCP today's assessment note.   JEmelia LoronRN, BSt. [email or web address]

## 2021-08-12 NOTE — Patient Outreach (Deleted)
[place] [facility]) Care Management  08/10/21  Milton Streicher 03-21-1951 [id number]  [facility]) Care Management RN Health Coach Note   08/12/2021 Name:  [name] MRN:  [id number] DOB:  [birth year]  Summary: Patient states she is doing fairly well.  She had a visit with Dr. Cruzita Lederer on 07/19/21 and is feeling more motivated about lowering her A1c. Patient explained that her blood sugar ranges are 140-200 and she has begun to make healthier food choices. Patient reports that her B/P ranges have been good at home. Her goals are to increase her physical activity, continue to make better food choices, and lower her A1c and B/P for her overall health and wellness. Patient did not have any further questions or concerns today and did confirm that she has this nurse's contact number to call her if needed.   Recommendations/Changes made from today's visit: Do Chair exercises every Tuesday and Thursday Use the [employer] dial a dietitian to assist you with diabetes, hypertension, and weight loss diet plan; [phone] Code: 106 Monitor your blood sugar and B/P daily and record the values Contact your providers for questions or concerns  Subjective: [name] is an 71 y.o. year old femalemale who is a primary patient of [name], MD. The care management team was consulted for assistance with care management and/or care coordination needs.    RN Health Coach completed Telephone Visit today.   Objective:  Medications Reviewed Today     Reviewed by Michiel Cowboy, RN (Registered Nurse) on 08/10/21 at [place] List Status: <None>   Medication Order Taking? Sig Documenting Provider Last Dose Status Informant  acetaminophen (TYLENOL) 500 MG tablet [id number] Yes Take 1,000 mg by mouth 2 (two) times daily as needed for moderate pain or headache. [provider] Taking Active Self  albuterol (VENTOLIN HFA) 108 (90 Base) MCG/ACT inhaler [id number]  Yes Inhale 2 puffs every 4-6 hours as needed - rescue. Deneise Lever, MD Taking Active   anastrozole (ARIMIDEX) 1 MG tablet [id number] Yes Take 1 tablet (1 mg total) by mouth daily. Nicholas Lose, MD Taking Active   blood glucose meter kit and supplies [id number] Yes Dispense based on patient and insurance preference. Use up to four times daily as directed. (FOR ICD-10 E10.9, E11.9). [name], MD Taking Active   busPIRone (BUSPAR) 7.5 MG tablet [id number] Yes Take 1 tablet (7.5 mg total) by mouth 3 (three) times daily. [name], MD Taking Active   carvedilol (COREG) 12.5 MG tablet [id number] Yes Take 1 tablet 2 times daily with a meal. [name], MD Taking Active   diclofenac (VOLTAREN) 75 MG EC tablet [id number] Yes TAKE 1 TABLET 2 TIMES A DAY [name], MD Taking Active   diltiazem (CARDIZEM CD) 120 MG 24 hr capsule [id number] Yes Take 1 capsule (120 mg total) by mouth daily. [name], MD Taking Active   Docusate Calcium (STOOL SOFTENER PO) [id number] Yes Take by mouth daily. [provider] Taking Active   escitalopram (LEXAPRO) 20 MG tablet [id number] Yes TAKE 1 TABLET EVERY MORNING AND 1/2 EVERY EVENING [name], MD Taking Active   esomeprazole (NEXIUM) 40 MG capsule [id number] Yes Take 1 capsule (40 mg total) by mouth daily. [name], MD Taking Active   ezetimibe (ZETIA) 10 MG tablet [id number] Yes Take 1 tablet (10 mg total) by mouth daily. [name], MD Taking Active   fenofibrate (TRICOR) 145 MG tablet  [id number] Yes Take 1 tablet (145 mg total) by mouth daily. [name], MD Taking Active   fluticasone Asencion Islam) 50 MCG/ACT nasal spray [id number] Yes Place 2 sprays into both nostrils daily. [name], MD Taking Active   fluticasone furoate-vilanterol (BREO ELLIPTA) 100-25 MCG/INH AEPB [id number] Yes Inhale 1 puff then rinse mouth, once daily Young, Clinton D, MD Taking Active   furosemide (LASIX) 20 MG tablet [id number] Yes Take 1  tablet (20 mg total) by mouth daily. [name], MD Taking Active   glimepiride (AMARYL) 1 MG tablet [id number] Yes TAKE ONE TABLET ONCE DAILY WITH BREAKFAST [name], MD Taking Active   glucose blood (ACCU-CHEK AVIVA) test strip [id number] Yes Twice daily (please use brand that patient insurance covers) [name], MD Taking Active   Hypromellose (ARTIFICIAL TEARS OP) [id number] Yes Place 1 drop into both eyes daily as needed (dry eyes).  [provider] Taking Active Self  levothyroxine (SYNTHROID) 200 MCG tablet [id number] Yes Take 1 tablet (200 mcg total) by mouth daily. [name], MD Taking Active   levothyroxine (SYNTHROID) 50 MCG tablet [id number] Yes Take 1 tablet (50 mcg total) by mouth daily. Debbrah Alar, NP Taking Active   loratadine (CLARITIN) 10 MG tablet [id number] Yes Take 1 tablet (10 mg total) by mouth daily. [name], MD Taking Active   losartan (COZAAR) 100 MG tablet [id number] Yes Take 1 tablet (100 mg total) by mouth daily. [name], MD Taking Active   meclizine (ANTIVERT) 25 MG tablet [id number] Yes Take 25 mg by mouth every 6 (six) hours as needed. [provider] Taking Active   metFORMIN (GLUCOPHAGE) 500 MG tablet [id number] Yes TAKE 2 TABLETS 2 TIMES A DAY WITH MEALS [name], MD Taking Active   montelukast (SINGULAIR) 10 MG tablet [id number] Yes Take 1 tablet (10 mg total) by mouth at bedtime. [name], MD Taking Active   rosuvastatin (CRESTOR) 40 MG tablet [id number] Yes Take 1 tablet (40 mg total) by mouth daily. [name], MD Taking Active   Semaglutide,0.25 or 0.5MG /DOS, (OZEMPIC, 0.25 OR 0.5 MG/DOSE,) 2 MG/1.5ML SOPN [id number] Yes Inject 0.5 mg into the skin once a week. Philemon Kingdom, MD Taking Active   TRUEPLUS PEN NEEDLES 31G X 6 MM MISC [id number] Yes USE WITH INSULIN UP TO 4 TIMES A DAY AS NEEDED [name], MD Taking Active   Vitamin D, Ergocalciferol, (DRISDOL) 1.25 MG (50000 UNIT)  CAPS capsule [id number] Yes Take 1 capsule (50,000 Units total) by mouth every 7 (seven) days. [name], MD Taking Active              SDOH:  (Social Determinants of Health) assessments and interventions performed: SDOH assessments completed today and documented in the Epic system.    Care Plan  Review of patient past medical history, allergies, medications, health status, including review of consultants reports, laboratory and other test data, was performed as part of comprehensive evaluation for care management services.   Care Plan : RN Care Manager Plan of Care  Updates made by Michiel Cowboy, RN since 08/12/2021 12:00 AM     Problem: Knowledge Deficit Related to Diabetes and Hypertension   Priority: High     Long-Range Goal: Development of Plan of Care for the Management of Diabetes and Hypertension   Start Date: 08/10/2021  Expected End Date: 08/21/2022  Note:   Current Barriers:  Chronic Disease Management support and education needs related to HTN  and DMII   RNCM Clinical Goal(s):  Patient will demonstrate Improved adherence to prescribed treatment plan for HTN and DMII as evidenced by monitoring blood sugar and B/P daily; lowering A1c gradually to goal of <7; maintaining B/P < 150/90; contacting providers for questions and concerns continue to work with RN Care Manager to address care management and care coordination needs related to  HTN and DMII as evidenced by adherence to CM Team Scheduled appointments work with pharmacist to address polypharmacy and education of current medications related tobest time to take medications, side effects, any interactions as evidenced by review or EMR and patient or pharmacist report through collaboration with Consulting civil engineer, provider, and care team.   Interventions: Inter-disciplinary care team collaboration (see longitudinal plan of care) Evaluation of current treatment plan related to self management and patient's adherence to  plan as established by provider   Diabetes Interventions:  (Status:  Goal on track:  Yes.) Long Term Goal Assessed patient's understanding of A1c goal: <7% Provided education to patient about basic DM disease process Reviewed medications with patient and discussed importance of medication adherence Counseled on importance of regular laboratory monitoring as prescribed Discussed plans with patient for ongoing care management follow up and provided patient with direct contact information for care management team Advised patient, providing education and rationale, to check cbg daily and record, calling PCP for findings outside established parameters Referral made to pharmacy team for assistance with polypharmacy and education regarding medications Screening for signs and symptoms of depression related to chronic disease state  Assessed social determinant of health barriers Provided patient with dial a dietitian form [employer] to receive education by RD  Encouraged patient to do chair exercises 2 times a week; discussed using You-Tube Lab Results  Component Value Date   HGBA1C 8.6 (H) 07/16/2021   Hypertension Interventions:  (Status:  Goal on track:  Yes.) Long Term Goal Last practice recorded BP readings:  BP Readings from Last 3 Encounters:  07/19/21 (!) 144/90  07/16/21 130/88  05/10/21 130/90  Most recent eGFR/CrCl:  Lab Results  Component Value Date   EGFR >60 04/11/2017    No components found for: CRCL  Evaluation of current treatment plan related to hypertension self management and patient's adherence to plan as established by provider Reviewed medications with patient and discussed importance of compliance Discussed plans with patient for ongoing care management follow up and provided patient with direct contact information for care management team Advised patient, providing education and rationale, to monitor blood pressure daily and record, calling PCP for findings outside  established parameters Advised patient to discuss blood pressure goal with provider Provided education on prescribed diet low sodium Discussed complications of poorly controlled blood pressure such as heart disease, stroke, circulatory complications, vision complications, kidney impairment, sexual dysfunction  Patient Goals/Self-Care Activities: Take all medications as prescribed Attend all scheduled provider appointments Call provider office for new concerns or questions  keep appointment with eye doctor check blood sugar at prescribed times: once daily check feet daily for cuts, sores or redness enter blood sugar readings and medication or insulin into daily log take the blood sugar log to all doctor visits set goal weight trim toenails straight across drink 6 to 8 glasses of water each day eat fish at least once per week fill half of plate with vegetables limit fast food meals to no more than 1 per week manage portion size prepare main meal at home 3 to 5 days each week set a realistic  goal wear comfortable, well-fitting shoes check blood pressure daily write blood pressure results in a log or diary learn about high blood pressure take blood pressure log to all doctor appointments call doctor for signs and symptoms of high blood pressure take medications for blood pressure exactly as prescribed limit salt intake to 2313m/day Do Chair exercises every Tuesday and Thursday Use the [employer] dial a dietitian to assist you with diabetes, hypertension, and weight loss diet plan; [phone]: 106  Follow Up Plan:  Telephone follow up appointment with care management team member scheduled for:  May        Plan: Telephone follow up appointment with care management team member scheduled for:  May . Nurse provided patient with [employer] dial a dietitian resource, will send patient a planning healthy meals booklet, will refer patient to pharmacy for polypharmacy and education, and will  send PCP today's assessment note.   JEmelia LoronRN, [email or web address]

## 2021-08-13 ENCOUNTER — Ambulatory Visit (INDEPENDENT_AMBULATORY_CARE_PROVIDER_SITE_OTHER): Payer: Medicare Other

## 2021-08-13 VITALS — Ht 65.0 in | Wt 321.0 lb

## 2021-08-13 DIAGNOSIS — Z1231 Encounter for screening mammogram for malignant neoplasm of breast: Secondary | ICD-10-CM

## 2021-08-13 DIAGNOSIS — Z0000 Encounter for general adult medical examination without abnormal findings: Secondary | ICD-10-CM | POA: Diagnosis not present

## 2021-08-13 DIAGNOSIS — Z780 Asymptomatic menopausal state: Secondary | ICD-10-CM | POA: Diagnosis not present

## 2021-08-13 NOTE — Patient Outreach (Addendum)
[place] [facility]) Care Management  08/13/2021  [name] 04-18-1951 [id number]   Referral for pharmacy assistance from Emelia Loron, RN sent to embedded team care guide pool.   [name] [facility] Management Assistant [phone]

## 2021-08-13 NOTE — Patient Outreach (Addendum)
[facility]) Care Management Cyrus Note   08/10/21 Name:  [name] MRN:  [id number] DOB:  1950/10/01  Summary: Patient states she is doing fairly well.  She had a visit with Dr. Cruzita Lederer on 07/19/21 and is feeling more motivated about lowering her A1c. Patient explained that her blood sugar ranges are 140-200 and she has begun to make healthier food choices. Patient reports that her B/P ranges have been good at home. Her goals are to increase her physical activity, continue to make better food choices, and lower her A1c and B/P for her overall health and wellness. Patient did not have any further questions or concerns today and did confirm that she has this nurse's contact number to call her if needed.   Recommendations/Changes made from today's visit: Do Chair exercises every Tuesday and Thursday Use the [employer] dial a dietitian to assist you with diabetes, hypertension, and weight loss diet plan; [phone] Code: [phone] Monitor your blood sugar and B/P daily and record the values Contact your providers for questions or concerns  Subjective: [name] is an 71 y.o. year old female who is a primary patient of [name], MD. The care management team was consulted for assistance with care management and/or care coordination needs.    RN Health Coach completed Telephone Visit today.   Objective:  Medications Reviewed Today     Reviewed by Michiel Cowboy, RN (Registered Nurse) on 08/10/21 at [place] List Status: <None>   Medication Order Taking? Sig Documenting Provider Last Dose Status Informant  acetaminophen (TYLENOL) 500 MG tablet [id number] Yes Take 1,000 mg by mouth 2 (two) times daily as needed for moderate pain or headache. [provider] Taking Active Self  albuterol (VENTOLIN HFA) 108 (90 Base) MCG/ACT inhaler [id number] Yes Inhale 2 puffs every 4-6 hours as needed - rescue. Deneise Lever, MD Taking Active   anastrozole  (ARIMIDEX) 1 MG tablet [id number] Yes Take 1 tablet (1 mg total) by mouth daily. Nicholas Lose, MD Taking Active   blood glucose meter kit and supplies [id number] Yes Dispense based on patient and insurance preference. Use up to four times daily as directed. (FOR ICD-10 E10.9, E11.9). [name], MD Taking Active   busPIRone (BUSPAR) 7.5 MG tablet [id number] Yes Take 1 tablet (7.5 mg total) by mouth 3 (three) times daily. [name], MD Taking Active   carvedilol (COREG) 12.5 MG tablet [id number] Yes Take 1 tablet 2 times daily with a meal. [name], MD Taking Active   diclofenac (VOLTAREN) 75 MG EC tablet [id number] Yes TAKE 1 TABLET 2 TIMES A DAY [name], MD Taking Active   diltiazem (CARDIZEM CD) 120 MG 24 hr capsule [id number] Yes Take 1 capsule (120 mg total) by mouth daily. [name], MD Taking Active   Docusate Calcium (STOOL SOFTENER PO) [id number] Yes Take by mouth daily. [provider] Taking Active   escitalopram (LEXAPRO) 20 MG tablet [id number] Yes TAKE 1 TABLET EVERY MORNING AND 1/2 EVERY EVENING [name], MD Taking Active   esomeprazole (NEXIUM) 40 MG capsule [id number] Yes Take 1 capsule (40 mg total) by mouth daily. [name], MD Taking Active   ezetimibe (ZETIA) 10 MG tablet [id number] Yes Take 1 tablet (10 mg total) by mouth daily. [name], MD Taking Active   fenofibrate (TRICOR) 145 MG tablet [id number] Yes Take 1 tablet (145 mg total) by mouth daily. [name],  MD Taking Active   fluticasone (FLONASE) 50 MCG/ACT nasal spray [id number] Yes Place 2 sprays into both nostrils daily. [name], MD Taking Active   fluticasone furoate-vilanterol (BREO ELLIPTA) 100-25 MCG/INH AEPB [id number] Yes Inhale 1 puff then rinse mouth, once daily Young, Clinton D, MD Taking Active   furosemide (LASIX) 20 MG tablet [id number] Yes Take 1 tablet (20 mg total) by mouth daily. [name], MD Taking Active   glimepiride (AMARYL) 1 MG tablet  [id number] Yes TAKE ONE TABLET ONCE DAILY WITH BREAKFAST [name], MD Taking Active   glucose blood (ACCU-CHEK AVIVA) test strip [id number] Yes Twice daily (please use brand that patient insurance covers) [name], MD Taking Active   Hypromellose (ARTIFICIAL TEARS OP) [id number] Yes Place 1 drop into both eyes daily as needed (dry eyes).  [provider] Taking Active Self  levothyroxine (SYNTHROID) 200 MCG tablet [id number] Yes Take 1 tablet (200 mcg total) by mouth daily. [name], MD Taking Active   levothyroxine (SYNTHROID) 50 MCG tablet [id number] Yes Take 1 tablet (50 mcg total) by mouth daily. Debbrah Alar, NP Taking Active   loratadine (CLARITIN) 10 MG tablet [id number] Yes Take 1 tablet (10 mg total) by mouth daily. [name], MD Taking Active   losartan (COZAAR) 100 MG tablet [id number] Yes Take 1 tablet (100 mg total) by mouth daily. [name], MD Taking Active   meclizine (ANTIVERT) 25 MG tablet [id number] Yes Take 25 mg by mouth every 6 (six) hours as needed. [provider] Taking Active   metFORMIN (GLUCOPHAGE) 500 MG tablet [id number] Yes TAKE 2 TABLETS 2 TIMES A DAY WITH MEALS [name], MD Taking Active   montelukast (SINGULAIR) 10 MG tablet [id number] Yes Take 1 tablet (10 mg total) by mouth at bedtime. [name], MD Taking Active   rosuvastatin (CRESTOR) 40 MG tablet [id number] Yes Take 1 tablet (40 mg total) by mouth daily. [name], MD Taking Active   Semaglutide,0.25 or 0.5MG/DOS, (OZEMPIC, 0.25 OR 0.5 MG/DOSE,) 2 MG/1.5ML SOPN [id number] Yes Inject 0.5 mg into the skin once a week. Philemon Kingdom, MD Taking Active   TRUEPLUS PEN NEEDLES 31G X 6 MM MISC [id number] Yes USE WITH INSULIN UP TO 4 TIMES A DAY AS NEEDED [name], MD Taking Active   Vitamin D, Ergocalciferol, (DRISDOL) 1.25 MG (50000 UNIT) CAPS capsule [id number] Yes Take 1 capsule (50,000 Units total) by mouth every 7 (seven) days. [name],  MD Taking Active              SDOH:  (Social Determinants of Health) assessments and interventions performed:    Care Plan  Review of patient past medical history, allergies, medications, health status, including review of consultants reports, laboratory and other test data, was performed as part of comprehensive evaluation for care management services.   Care Plan : RN Care Manager Plan of Care  Updates made by Michiel Cowboy, RN since 08/12/2021 12:00 AM     Problem: Knowledge Deficit Related to Diabetes and Hypertension   Priority: High     Long-Range Goal: Development of Plan of Care for the Management of Diabetes and Hypertension   Start Date: 08/10/2021  Expected End Date: 08/21/2022  Note:   Current Barriers:  Chronic Disease Management support and education needs related to HTN and DMII   RNCM Clinical Goal(s):  Patient will demonstrate Improved adherence to prescribed treatment plan for HTN and DMII as evidenced by  monitoring blood sugar and B/P daily; lowering A1c gradually to goal of <7; maintaining B/P < 150/90; contacting providers for questions and concerns continue to work with RN Care Manager to address care management and care coordination needs related to  HTN and DMII as evidenced by adherence to CM Team Scheduled appointments work with pharmacist to address polypharmacy and education of current medications related tobest time to take medications, side effects, any interactions as evidenced by review or EMR and patient or pharmacist report through collaboration with Consulting civil engineer, provider, and care team.   Interventions: Inter-disciplinary care team collaboration (see longitudinal plan of care) Evaluation of current treatment plan related to self management and patient's adherence to plan as established by provider   Diabetes Interventions:  (Status:  Goal on track:  Yes.) Long Term Goal Assessed patient's understanding of A1c goal: <7% Provided education  to patient about basic DM disease process Reviewed medications with patient and discussed importance of medication adherence Counseled on importance of regular laboratory monitoring as prescribed Discussed plans with patient for ongoing care management follow up and provided patient with direct contact information for care management team Advised patient, providing education and rationale, to check cbg daily and record, calling PCP for findings outside established parameters Referral made to pharmacy team for assistance with polypharmacy and education regarding medications Screening for signs and symptoms of depression related to chronic disease state  Assessed social determinant of health barriers Provided patient with dial a dietitian form [employer] to receive education by RD  Encouraged patient to do chair exercises 2 times a week; discussed using You-Tube Lab Results  Component Value Date   HGBA1C 8.6 (H) 07/16/2021   Hypertension Interventions:  (Status:  Goal on track:  Yes.) Long Term Goal Last practice recorded BP readings:  BP Readings from Last 3 Encounters:  07/19/21 (!) 144/90  07/16/21 130/88  05/10/21 130/90  Most recent eGFR/CrCl:  Lab Results  Component Value Date   EGFR >60 04/11/2017    No components found for: CRCL  Evaluation of current treatment plan related to hypertension self management and patient's adherence to plan as established by provider Reviewed medications with patient and discussed importance of compliance Discussed plans with patient for ongoing care management follow up and provided patient with direct contact information for care management team Advised patient, providing education and rationale, to monitor blood pressure daily and record, calling PCP for findings outside established parameters Advised patient to discuss blood pressure goal with provider Provided education on prescribed diet low sodium Discussed complications of poorly controlled  blood pressure such as heart disease, stroke, circulatory complications, vision complications, kidney impairment, sexual dysfunction  Patient Goals/Self-Care Activities: Take all medications as prescribed Attend all scheduled provider appointments Call provider office for new concerns or questions  keep appointment with eye doctor check blood sugar at prescribed times: once daily check feet daily for cuts, sores or redness enter blood sugar readings and medication or insulin into daily log take the blood sugar log to all doctor visits set goal weight trim toenails straight across drink 6 to 8 glasses of water each day eat fish at least once per week fill half of plate with vegetables limit fast food meals to no more than 1 per week manage portion size prepare main meal at home 3 to 5 days each week set a realistic goal wear comfortable, well-fitting shoes check blood pressure daily write blood pressure results in a log or diary learn about high blood pressure take  blood pressure log to all doctor appointments call doctor for signs and symptoms of high blood pressure take medications for blood pressure exactly as prescribed limit salt intake to 2367m/day Do Chair exercises every Tuesday and Thursday Use the [employer] dial a dietitian to assist you with diabetes, hypertension, and weight loss diet plan; [phone]: 106  Follow Up Plan:  Telephone follow up appointment with care management team member scheduled for:  May        Plan: Telephone follow up appointment with care management team member scheduled for:  May . Nurse provided patient with [employer] dial a dietitian resource, will send patient a planning healthy meals booklet, will refer patient to pharmacy for polypharmacy and education, and will send PCP today's assessment note.   JEmelia LoronRN, [email or web address]

## 2021-08-13 NOTE — Patient Instructions (Signed)
[name] , Thank you for taking time to complete your Medicare Wellness Visit. I appreciate your ongoing commitment to your health goals. Please review the following plan we discussed and let me know if I can assist you in the future.   Screening recommendations/referrals: Colonoscopy: Due- Per our conversation, you are in contact with GI. Mammogram: Ordered today. Someone will call you to schedule. Bone Density: Ordered today. Someone will call you to schedule. Recommended yearly ophthalmology/optometry visit for glaucoma screening and checkup Recommended yearly dental visit for hygiene and checkup  Vaccinations: Influenza vaccine: Up to date Pneumococcal vaccine: Up to date Tdap vaccine: Due-May obtain vaccine at  your local pharmacy. Shingles vaccine: Completed first dose. Discuss second dose with pharmacy.   Covid-19:Up to date  Advanced directives: Please bring a copy of Living Will and/or Healthcare Power of Attorney for your chart.   Conditions/risks identified: See problem list  Next appointment: Follow up in one year for your annual wellness visit    Preventive Care 65 Years and Older, Female Preventive care refers to lifestyle choices and visits with your health care provider that can promote health and wellness. What does preventive care include? A yearly physical exam. This is also called an annual well check. Dental exams once or twice a year. Routine eye exams. Ask your health care provider how often you should have your eyes checked. Personal lifestyle choices, including: Daily care of your teeth and gums. Regular physical activity. Eating a healthy diet. Avoiding tobacco and drug use. Limiting alcohol use. Practicing safe sex. Taking low-dose aspirin every day. Taking vitamin and mineral supplements as recommended by your health care provider. What happens during an annual well check? The services and screenings done by your health care provider during your  annual well check will depend on your age, overall health, lifestyle risk factors, and family history of disease. Counseling  Your health care provider may ask you questions about your: Alcohol use. Tobacco use. Drug use. Emotional well-being. Home and relationship well-being. Sexual activity. Eating habits. History of falls. Memory and ability to understand (cognition). Work and work Statistician. Reproductive health. Screening  You may have the following tests or measurements: Height, weight, and BMI. Blood pressure. Lipid and cholesterol levels. These may be checked every 5 years, or more frequently if you are over 75 years old. Skin check. Lung cancer screening. You may have this screening every year starting at age 35 if you have a 30-pack-year history of smoking and currently smoke or have quit within the past 15 years. Fecal occult blood test (FOBT) of the stool. You may have this test every year starting at age 65. Flexible sigmoidoscopy or colonoscopy. You may have a sigmoidoscopy every 5 years or a colonoscopy every 10 years starting at age 34. Hepatitis C blood test. Hepatitis B blood test. Sexually transmitted disease (STD) testing. Diabetes screening. This is done by checking your blood sugar (glucose) after you have not eaten for a while (fasting). You may have this done every 1-3 years. Bone density scan. This is done to screen for osteoporosis. You may have this done starting at age 48. Mammogram. This may be done every 1-2 years. Talk to your health care provider about how often you should have regular mammograms. Talk with your health care provider about your test results, treatment options, and if necessary, the need for more tests. Vaccines  Your health care provider may recommend certain vaccines, such as: Influenza vaccine. This is recommended every year. Tetanus, diphtheria,  and acellular pertussis (Tdap, Td) vaccine. You may need a Td booster every 10  years. Zoster vaccine. You may need this after age 63. Pneumococcal 13-valent conjugate (PCV13) vaccine. One dose is recommended after age 31. Pneumococcal polysaccharide (PPSV23) vaccine. One dose is recommended after age 68. Talk to your health care provider about which screenings and vaccines you need and how often you need them. This information is not intended to replace advice given to you by your health care provider. Make sure you discuss any questions you have with your health care provider. Document Released: 07/07/2015 Document Revised: 02/28/2016 Document Reviewed: 04/11/2015 Elsevier Interactive Patient Education  2017 [place] Prevention in the Home Falls can cause injuries. They can happen to people of all ages. There are many things you can do to make your home safe and to help prevent falls. What can I do on the outside of my home? Regularly fix the edges of walkways and driveways and fix any cracks. Remove anything that might make you trip as you walk through a door, such as a raised step or threshold. Trim any bushes or trees on the path to your home. Use bright outdoor lighting. Clear any walking paths of anything that might make someone trip, such as rocks or tools. Regularly check to see if handrails are loose or broken. Make sure that both sides of any steps have handrails. Any raised decks and porches should have guardrails on the edges. Have any leaves, snow, or ice cleared regularly. Use sand or salt on walking paths during winter. Clean up any spills in your garage right away. This includes oil or grease spills. What can I do in the bathroom? Use night lights. Install grab bars by the toilet and in the tub and shower. Do not use towel bars as grab bars. Use non-skid mats or decals in the tub or shower. If you need to sit down in the shower, use a plastic, non-slip stool. Keep the floor dry. Clean up any water that spills on the floor as soon as it  happens. Remove soap buildup in the tub or shower regularly. Attach bath mats securely with double-sided non-slip rug tape. Do not have throw rugs and other things on the floor that can make you trip. What can I do in the bedroom? Use night lights. Make sure that you have a light by your bed that is easy to reach. Do not use any sheets or blankets that are too big for your bed. They should not hang down onto the floor. Have a firm chair that has side arms. You can use this for support while you get dressed. Do not have throw rugs and other things on the floor that can make you trip. What can I do in the kitchen? Clean up any spills right away. Avoid walking on wet floors. Keep items that you use a lot in easy-to-reach places. If you need to reach something above you, use a strong step stool that has a grab bar. Keep electrical cords out of the way. Do not use floor polish or wax that makes floors slippery. If you must use wax, use non-skid floor wax. Do not have throw rugs and other things on the floor that can make you trip. What can I do with my stairs? Do not leave any items on the stairs. Make sure that there are handrails on both sides of the stairs and use them. Fix handrails that are broken or loose. Make sure  that handrails are as long as the stairways. Check any carpeting to make sure that it is firmly attached to the stairs. Fix any carpet that is loose or worn. Avoid having throw rugs at the top or bottom of the stairs. If you do have throw rugs, attach them to the floor with carpet tape. Make sure that you have a light switch at the top of the stairs and the bottom of the stairs. If you do not have them, ask someone to add them for you. What else can I do to help prevent falls? Wear shoes that: Do not have high heels. Have rubber bottoms. Are comfortable and fit you well. Are closed at the toe. Do not wear sandals. If you use a stepladder: Make sure that it is fully opened.  Do not climb a closed stepladder. Make sure that both sides of the stepladder are locked into place. Ask someone to hold it for you, if possible. Clearly mark and make sure that you can see: Any grab bars or handrails. First and last steps. Where the edge of each step is. Use tools that help you move around (mobility aids) if they are needed. These include: Canes. Walkers. Scooters. Crutches. Turn on the lights when you go into a dark area. Replace any light bulbs as soon as they burn out. Set up your furniture so you have a clear path. Avoid moving your furniture around. If any of your floors are uneven, fix them. If there are any pets around you, be aware of where they are. Review your medicines with your doctor. Some medicines can make you feel dizzy. This can increase your chance of falling. Ask your doctor what other things that you can do to help prevent falls. This information is not intended to replace advice given to you by your health care provider. Make sure you discuss any questions you have with your health care provider. Document Released: 04/06/2009 Document Revised: 11/16/2015 Document Reviewed: 07/15/2014 Elsevier Interactive Patient Education  2017 [employer].

## 2021-08-13 NOTE — Progress Notes (Signed)
Subjective:   [name] is a 71 y.o. female who presents for Medicare Annual (Subsequent) preventive examination.  I connected with Yaminah today by telephone and verified that I am speaking with the correct person using two identifiers. Location patient: home Location provider: work Persons participating in the virtual visit: patient, Marine scientist.    I discussed the limitations, risks, security and privacy concerns of performing an evaluation and management service by telephone and the availability of in person appointments. I also discussed with the patient that there may be a patient responsible charge related to this service. The patient expressed understanding and verbally consented to this telephonic visit.    Interactive audio and video telecommunications were attempted between this provider and patient, however failed, due to patient having technical difficulties OR patient did not have access to video capability.  We continued and completed visit with audio only.  Some vital signs may be absent or patient reported.   Time Spent with patient on telephone encounter: 20 minutes   Review of Systems     Cardiac Risk Factors include: advanced age (>4mn, >>18women);hypertension;diabetes mellitus;dyslipidemia;obesity (BMI >30kg/m2)     Objective:    Today's Vitals   08/13/21 0941  Weight: (!) 321 lb (145.6 kg)  Height: _0  (1.651 m)   Body mass index is 53.42 kg/m.  Advanced Directives 08/13/2021 08/28/2020 07/04/2020 09/23/2019 04/02/2019 09/18/2018 05/29/2017  Does Patient Have a Medical Advance Directive? _1  Yes Yes  Type of AParamed[place]of [place]ainesvilleLiving will HCarsonLiving will HSt. FrancisvilleLiving will HLaw[place]villeLiving will HOrchard HillLiving will H[place]ornburgLiving will Living will  Does patient want to make changes to medical advance  directive? - - No - Patient declined No - Patient declined No - Patient declined No - Patient declined -  Copy of HLough[place]in Chart? No - copy requested No - copy requested - No - copy requested No - copy requested No - copy requested -    Current Medications (verified) Outpatient Encounter Medications as of 08/13/2021  Medication Sig   acetaminophen (TYLENOL) 500 MG tablet Take 1,000 mg by mouth 2 (two) times daily as needed for moderate pain or headache.   albuterol (VENTOLIN HFA) 108 (90 Base) MCG/ACT inhaler Inhale 2 puffs every 4-6 hours as needed - rescue.   anastrozole (ARIMIDEX) 1 MG tablet Take 1 tablet (1 mg total) by mouth daily.   blood glucose meter kit and supplies Dispense based on patient and insurance preference. Use up to four times daily as directed. (FOR ICD-10 E10.9, E11.9).   busPIRone (BUSPAR) 7.5 MG tablet Take 1 tablet (7.5 mg total) by mouth 3 (three) times daily.   carvedilol (COREG) 12.5 MG tablet Take 1 tablet 2 times daily with a meal.   diclofenac (VOLTAREN) 75 MG EC tablet TAKE 1 TABLET 2 TIMES A DAY   diltiazem (CARDIZEM CD) 120 MG 24 hr capsule Take 1 capsule (120 mg total) by mouth daily.   Docusate Calcium (STOOL SOFTENER PO) Take by mouth daily.   escitalopram (LEXAPRO) 20 MG tablet TAKE 1 TABLET EVERY MORNING AND 1/2 EVERY EVENING   esomeprazole (NEXIUM) 40 MG capsule Take 1 capsule (40 mg total) by mouth daily.   ezetimibe (ZETIA) 10 MG tablet Take 1 tablet (10 mg total) by mouth daily.   fenofibrate (TRICOR) 145 MG tablet Take 1 tablet (145 mg total) by mouth daily.  fluticasone (FLONASE) 50 MCG/ACT nasal spray Place 2 sprays into both nostrils daily.   fluticasone furoate-vilanterol (BREO ELLIPTA) 100-25 MCG/INH AEPB Inhale 1 puff then rinse mouth, once daily   furosemide (LASIX) 20 MG tablet Take 1 tablet (20 mg total) by mouth daily.   glimepiride (AMARYL) 1 MG tablet TAKE ONE TABLET ONCE DAILY WITH BREAKFAST   glucose blood  (ACCU-CHEK AVIVA) test strip Twice daily (please use brand that patient insurance covers)   Hypromellose (ARTIFICIAL TEARS OP) Place 1 drop into both eyes daily as needed (dry eyes).    levothyroxine (SYNTHROID) 200 MCG tablet Take 1 tablet (200 mcg total) by mouth daily.   levothyroxine (SYNTHROID) 50 MCG tablet Take 1 tablet (50 mcg total) by mouth daily.   loratadine (CLARITIN) 10 MG tablet Take 1 tablet (10 mg total) by mouth daily.   losartan (COZAAR) 100 MG tablet Take 1 tablet (100 mg total) by mouth daily.   meclizine (ANTIVERT) 25 MG tablet Take 25 mg by mouth every 6 (six) hours as needed.   metFORMIN (GLUCOPHAGE) 500 MG tablet TAKE 2 TABLETS 2 TIMES A DAY WITH MEALS   montelukast (SINGULAIR) 10 MG tablet Take 1 tablet (10 mg total) by mouth at bedtime.   rosuvastatin (CRESTOR) 40 MG tablet Take 1 tablet (40 mg total) by mouth daily.   Semaglutide,0.25 or 0.5MG/DOS, (OZEMPIC, 0.25 OR 0.5 MG/DOSE,) 2 MG/1.5ML SOPN Inject 0.5 mg into the skin once a week.   TRUEPLUS PEN NEEDLES 31G X 6 MM MISC USE WITH INSULIN UP TO 4 TIMES A DAY AS NEEDED   Vitamin D, Ergocalciferol, (DRISDOL) 1.25 MG (50000 UNIT) CAPS capsule Take 1 capsule (50,000 Units total) by mouth every 7 (seven) days.   No facility-administered encounter medications on file as of 08/13/2021.    Allergies (verified) No known allergies   History: Past Medical History:  Diagnosis Date   Anemia 04/05/2012   Anxiety    Anxiety state 09/11/2007   Qualifier: Diagnosis of  By: Sherren Mocha RN, Dorian Pod     Asthma    Atrial tachycardia, paroxysmal ([place])    Back pain 10/02/2014   Breast cancer ([place]) 02/17/2017   CAD (coronary artery disease), native coronary artery    remote Cath with nonobstructive ASCAD with 20% mid LAD, 20% prox left circ and 20% ostial RCA   Chicken pox as a child   Chronic diastolic CHF (congestive heart failure), NYHA class 1 ([place])    Complication of anesthesia    pt states feels different in her body after  anesthesia when waking up and also experiences a smell of burnt plastic for approx a wk    Constipation    Depression    Diabetes ([place])    Dilated aortic root ([place])    22m by echo 08/2020   Dysuria 02/17/2017   Elevated LFTs 04/05/2012   GERD (gastroesophageal reflux disease)    Goiter    Heart murmur    hx of one at birth    History of kidney stones    History of radiation therapy 10/31/16-12/17/16   left breast 45 Gy in 25 fractions, left breast boost 16 Gy in 8 fractions   Hx of colonic polyps    Hyperlipidemia    Hypertension    Insomnia 10/08/2016   Joint pain    Malignant neoplasm of upper-outer quadrant of left female breast ([place] 05/14/2016   not with patient   Measles as a child   Mumps as a child   OA (osteoarthritis)  of knee 04/05/2012   Obesity 07/11/2014   PONV (postoperative nausea and vomiting)    Preventative health care 09/05/2013   PVC's (premature ventricular contractions) 05/02/2016   Shortness of breath dyspnea    walking distances / climbing stairs   Sleep apnea 04/05/2012   Tinea corporis 02/23/2013   Type 2 diabetes mellitus with hyperglycemia ([place]) 12/31/2013   Vasomotor rhinitis 04/05/2012   Ventral hernia    Wheezing    Past Surgical History:  Procedure Laterality Date   BREAST LUMPECTOMY Left 2017   BREAST LUMPECTOMY WITH RADIOACTIVE SEED AND SENTINEL LYMPH NODE BIOPSY Left 06/21/2016   Procedure: LEFT BREAST LUMPECTOMY WITH RADIOACTIVE SEED AND SENTINEL LYMPH NODE BIOPSY, INJECT BLUE DYE LEFT BREAST;  Surgeon: Fanny Skates, MD;  Location: [facility];  Service: General;  Laterality: Left;   CARDIAC CATHETERIZATION     normal coroary arteries per patient   CARDIOVASCULAR STRESS TEST     10/12/2013   CESAREAN SECTION     X 3   CHOLECYSTECTOMY     COLONOSCOPY WITH PROPOFOL N/A 03/28/2015   Procedure: COLONOSCOPY WITH PROPOFOL;  Surgeon: Juanita Craver, MD;  Location: [facility];  Service: Endoscopy;  Laterality: N/A;   HERNIA REPAIR  02/08/11   ventral  hernia   JOINT REPLACEMENT     bilateral   KNEE ARTHROSCOPY  05/2010   bilateral   LEFT AND RIGHT HEART CATHETERIZATION WITH CORONARY ANGIOGRAM N/A 11/08/2013   Procedure: LEFT AND RIGHT HEART CATHETERIZATION WITH CORONARY ANGIOGRAM;  Surgeon: [name], MD;  Location: [facility] CATH LAB;  Service: Cardiovascular;  Laterality: N/A;   MENISCUS REPAIR  2009   MOUTH SURGERY     teeth implants   PORT-A-CATH REMOVAL N/A 01/24/2017   Procedure: REMOVAL PORT-A-CATH;  Surgeon: Fanny Skates, MD;  Location: [facility];  Service: General;  Laterality: N/A;   PORTACATH PLACEMENT Right 07/16/2016   Procedure: INSERTION PORT-A-CATH RIGHT INTERNAL JUGULAR WITH ULTRASOUND;  Surgeon: Fanny Skates, MD;  Location: [facility];  Service: General;  Laterality: Right;   REPAIR DURAL / CSF LEAK  2021   performed at [facility] in [place]  2011   left   TOTAL KNEE ARTHROPLASTY Right 05/10/2014   Procedure: RIGHT TOTAL KNEE ARTHROPLASTY;  Surgeon: Mauri Pole, MD;  Location: [facility];  Service: Orthopedics;  Laterality: Right;   TUBAL LIGATION     WISDOM TOOTH EXTRACTION  2000   Family History  Problem Relation Age of Onset   Thyroid disease Mother    Hyperlipidemia Mother    Depression Mother    Anxiety disorder Mother    Heart attack Father 66   Hypertension Father    Arthritis Father        RA   Coronary artery disease Father    High Cholesterol Father    Coronary artery disease Brother    Heart disease Brother    Cancer Maternal Aunt        colon   Cancer Maternal Grandmother        colon   Heart attack Maternal Grandfather    Alcohol abuse Paternal Grandfather    Social History   Socioeconomic History   Marital status: Divorced    Spouse name: Not on file   Number of children: 3   Years of education: Not on file   Highest education level: Not on file  Occupational History   Occupation: retired - [employer]  Tobacco Use   Smoking status: Never  Smokeless tobacco: Never  Vaping Use   Vaping Use: Never used  Substance and Sexual Activity   Alcohol use: Not Currently    Comment: rarely   Drug use: No   Sexual activity: Never  Other Topics Concern   Not on file  Social History Narrative   Not on file   Social Determinants of Health   Financial Resource Strain: Low Risk    Difficulty of Paying Living Expenses: Not hard at all  Food Insecurity: No Food Insecurity   Worried About Charity fundraiser in the Last Year: Never true   [place] in the Last Year: Never true  Transportation Needs: No Transportation Needs   Lack of Transportation (Medical): No   Lack of Transportation (Non-Medical): No  Physical Activity: Insufficiently Active   Days of Exercise per Week: 3 days   Minutes of Exercise per Session: 10 min  Stress: No Stress Concern Present   Feeling of Stress : Not at all  Social Connections: Moderately Isolated   Frequency of Communication with Friends and Family: More than three times a week   Frequency of Social Gatherings with Friends and Family: More than three times a week   Attends Religious Services: More than 4 times per year   Active Member of [employer] or Organizations: No   Attends Music therapist: Never   Marital Status: Divorced    Tobacco Counseling Counseling given: Not Answered   Clinical Intake:  Pre-visit preparation completed: Yes  Pain : No/denies pain     BMI - recorded: 53.42 Nutritional Status: BMI > 30  Obese Nutritional Risks: None Diabetes: Yes CBG done?: No Did pt. bring in CBG monitor from home?: No (phone visit)  How often do you need to have someone help you when you read instructions, pamphlets, or other written materials from your doctor or pharmacy?: 1 - Never  Diabetes:  Is the patient diabetic?  Yes  If diabetic, was a CBG obtained today?  No  Did the patient bring in their glucometer from home?  No phone visit How often do you monitor your  CBG's? daily.   Financial Strains and Diabetes Management:  Are you having any financial strains with the device, your supplies or your medication? No .  Does the patient want to be seen by Chronic Care Management for management of their diabetes?  No  Would the patient like to be referred to a Nutritionist or for Diabetic Management?  No   Diabetic Exams:  Diabetic Eye Exam: Completed 10/17/2020.  Diabetic Foot Exam:  Pt has been advised about the importance in completing this exam.To be completed by PCP.   Interpreter Needed?: No  Information entered by :: Caroleen Hamman LPN   Activities of Daily Living In your present state of health, do you have any difficulty performing the following activities: 08/13/2021  Hearing? N  Vision? N  Difficulty concentrating or making decisions? N  Walking or climbing stairs? Y  Comment stairs  Dressing or bathing? N  Doing errands, shopping? N  Preparing Food and eating ? N  Using the Toilet? N  In the past six months, have you accidently leaked urine? N  Do you have problems with loss of bowel control? N  Managing your Medications? N  Managing your Finances? N  Housekeeping or managing your Housekeeping? N  Some recent data might be hidden    Patient Care Team: [name], MD as PCP - General (Family Medicine) [name],  Eber Hong, MD as PCP - Cardiology (Cardiology) Fanny Skates, MD as Consulting Physician (General Surgery) Nicholas Lose, MD as Consulting Physician (Hematology and Oncology) Gery Pray, MD as Consulting Physician (Radiation Oncology) [name], NP as Nurse Practitioner (Hematology and Oncology) Michiel Cowboy, RN as [place] any recent [place] you may have received from other than [facility] providers in the past year (date may be approximate).     Assessment:   This is a routine wellness examination for [name].  Hearing/Vision screen Hearing  Screening - Comments:: C/o mild hearing loss Vision Screening - Comments:: Last eye exam-09/2020  Dietary issues and exercise activities discussed: Current Exercise Habits: Home exercise routine, Type of exercise: walking, Time (Minutes): 10, Frequency (Times/Week): 3, Weekly Exercise (Minutes/Week): 30, Intensity: Mild, Exercise limited by: None identified   Goals Addressed             This Visit's Progress    Patient Stated       Increase activity, drink more water & eat healthier       Depression Screen PHQ 2/9 Scores 08/13/2021 08/10/2021 03/20/2021 08/28/2020 07/04/2020 10/18/2019 09/23/2019  PHQ - 2 Score 0 0 0 1 0 0 0  PHQ- 9 Score - - - - - - -  Exception Documentation - - - - - - -    Fall Risk Fall Risk  08/13/2021 08/10/2021 03/20/2021 03/14/2021 08/28/2020  Falls in the past year? _0 Number falls in past yr: 0 _1 0  Injury with Fall? 0 0 0 0 0  Comment - - - - -  Risk for fall due to : - History of fall(s);Impaired balance/gait;Impaired mobility - Impaired mobility;Impaired balance/gait;History of fall(s) Impaired balance/gait;History of fall(s);Impaired mobility  Risk for fall due to: Comment - - - - -  Follow up Falls prevention discussed Falls prevention discussed;Education provided;Falls evaluation completed - Falls prevention discussed;Education provided;Falls evaluation completed Education provided;Falls evaluation completed;Falls prevention discussed    FALL RISK PREVENTION PERTAINING TO THE HOME:  Any stairs in or around the home? No  Home free of loose throw rugs in walkways, pet beds, electrical cords, etc? Yes  Adequate lighting in your home to reduce risk of falls? Yes   ASSISTIVE DEVICES UTILIZED TO PREVENT FALLS:  Life alert? No  Use of a cane, walker or w/c? No  Grab bars in the bathroom? Yes  Shower chair or bench in shower? Yes  Elevated toilet seat or a handicapped toilet? Yes   TIMED UP AND GO:  Was the test performed? No . Phone  visit   Cognitive Function:Normal cognitive status assessed by this Nurse Health Advisor. No abnormalities found.   MMSE - Mini Mental State Exam 04/22/2017  Orientation to time 5  Orientation to Place 5  Registration 3  Attention/ Calculation 5  Recall 2  Language- name 2 objects 2  Language- repeat 1  Language- follow 3 step command 3  Language- read & follow direction 1  Write a sentence 1  Copy design 1  Total score 29        Immunizations Immunization History  Administered Date(s) Administered   Fluad Quad(high Dose 65+) 04/21/2019, 03/20/2021   Influenza Split 03/24/2012, 03/24/2013   Influenza, High Dose Seasonal PF 04/18/2016, 04/21/2017, 03/24/2018, 04/27/2020   Influenza-Unspecified 03/21/2014, 03/15/2015, 03/18/2015   Moderna Covid-19 Vaccine Bivalent Booster 50yr & up 05/07/2021   Moderna SARS-COV2 Booster Vaccination 12/06/2020   Moderna Sars-Covid-2  Vaccination 07/29/2019, 08/27/2019, 04/19/2020   Pneumococcal Conjugate-13 04/18/2016   Pneumococcal Polysaccharide-23 05/22/2017   Tdap 06/24/2008   Zoster Recombinat (Shingrix) 04/21/2019   Zoster, Live 04/19/2011    TDAP status: Due, Education has been provided regarding the importance of this vaccine. Advised may receive this vaccine at local pharmacy or Health Dept. Aware to provide a copy of the vaccination record if obtained from local pharmacy or Health Dept. Verbalized acceptance and understanding.  Flu Vaccine status: Up to date  Pneumococcal vaccine status: Up to date  Covid-19 vaccine status: Completed vaccines  Qualifies for Shingles Vaccine? No   Zostavax completed Yes   Shingrix Completed?: Yes  Screening Tests Health Maintenance  Topic Date Due   TETANUS/TDAP  06/24/2018   Zoster Vaccines- Shingrix (2 of 2) 06/16/2019   FOOT EXAM  06/02/2021   OPHTHALMOLOGY EXAM  10/17/2021   HEMOGLOBIN A1C  01/13/2022   COLONOSCOPY (Pts 45-59yr Insurance coverage will need to be confirmed)   03/27/2022   MAMMOGRAM  08/09/2022   Pneumonia Vaccine 71 Years old  Completed   INFLUENZA VACCINE  Completed   DEXA SCAN  Completed   COVID-19 Vaccine  Completed   Hepatitis C Screening  Completed   HPV VACCINES  Aged Out    Health Maintenance  Health Maintenance Due  Topic Date Due   TETANUS/TDAP  06/24/2018   Zoster Vaccines- Shingrix (2 of 2) 06/16/2019   FOOT EXAM  06/02/2021    Colorectal cancer screening: Type of screening: Colonoscopy. Completed 03/28/2015. Repeat every 7 years  Mammogram status: Ordered today. Pt provided with contact info and advised to call to schedule appt.   Bone Density status: Ordered today. Pt provided with contact info and advised to call to schedule appt.  Lung Cancer Screening: (Low Dose CT Chest recommended if Age 71-80years, 30 pack-year currently smoking OR have quit w/in 15years.) does not qualify.     Additional Screening:  Hepatitis C Screening: Completed 09/02/2012  Vision Screening: Recommended annual ophthalmology exams for early detection of glaucoma and other disorders of the eye. Is the patient up to date with their annual eye exam?  Yes  Who is the provider or what is the name of the office in which the patient attends annual eye exams? Dr. OSyrian [place]  Dental Screening: Recommended annual dental exams for proper oral hygiene  Community Resource Referral / Chronic Care Management: CRR required this visit?  No   CCM required this visit?  No      Plan:     I have personally reviewed and noted the following in the patients chart:   Medical and social history Use of alcohol, tobacco or illicit drugs  Current medications and supplements including opioid prescriptions.  Functional ability and status Nutritional status Physical activity Advanced directives List of other physicians Hospitalizations, surgeries, and ER visits in previous 12 months Vitals Screenings to include cognitive, depression, and falls Referrals  and appointments  In addition, I have reviewed and discussed with patient certain preventive protocols, quality metrics, and best practice recommendations. A written personalized care plan for preventive services as well as general preventive health recommendations were provided to patient.   Due to this being a telephonic visit, the after visit summary with patients personalized plan was offered to patient via mail or my-chart. Patient would like to access on my-chart.  MMarta Antu LPN   [id number] Nurse Health Advisor  Nurse Notes: None

## 2021-08-20 ENCOUNTER — Telehealth: Payer: Self-pay | Admitting: Family Medicine

## 2021-08-20 DIAGNOSIS — Z853 Personal history of malignant neoplasm of breast: Secondary | ICD-10-CM

## 2021-08-20 DIAGNOSIS — N644 Mastodynia: Secondary | ICD-10-CM

## 2021-08-20 NOTE — Telephone Encounter (Signed)
Patient states she has a regular mammogram scheduled but she needs a diagnostics mammogram instead due to the pain on the left breast. She would like the order changed. Please advise.

## 2021-08-21 ENCOUNTER — Other Ambulatory Visit: Payer: Self-pay | Admitting: Family

## 2021-08-21 DIAGNOSIS — E039 Hypothyroidism, unspecified: Secondary | ICD-10-CM

## 2021-08-21 NOTE — Telephone Encounter (Signed)
Please advise pt that I sent her refill of her thyroid medication, however she needs a follow up TSH level please.

## 2021-08-22 ENCOUNTER — Other Ambulatory Visit: Payer: Self-pay | Admitting: Family Medicine

## 2021-08-22 DIAGNOSIS — N644 Mastodynia: Secondary | ICD-10-CM

## 2021-08-22 DIAGNOSIS — Z853 Personal history of malignant neoplasm of breast: Secondary | ICD-10-CM

## 2021-08-23 ENCOUNTER — Ambulatory Visit (HOSPITAL_COMMUNITY): Payer: Medicare Other | Attending: Cardiology

## 2021-08-23 ENCOUNTER — Other Ambulatory Visit: Payer: Self-pay

## 2021-08-23 DIAGNOSIS — D649 Anemia, unspecified: Secondary | ICD-10-CM | POA: Insufficient documentation

## 2021-08-23 DIAGNOSIS — I493 Ventricular premature depolarization: Secondary | ICD-10-CM | POA: Insufficient documentation

## 2021-08-23 DIAGNOSIS — R0602 Shortness of breath: Secondary | ICD-10-CM | POA: Insufficient documentation

## 2021-08-23 DIAGNOSIS — I509 Heart failure, unspecified: Secondary | ICD-10-CM | POA: Diagnosis not present

## 2021-08-23 DIAGNOSIS — G4730 Sleep apnea, unspecified: Secondary | ICD-10-CM | POA: Insufficient documentation

## 2021-08-23 DIAGNOSIS — E669 Obesity, unspecified: Secondary | ICD-10-CM | POA: Insufficient documentation

## 2021-08-23 DIAGNOSIS — E119 Type 2 diabetes mellitus without complications: Secondary | ICD-10-CM | POA: Diagnosis not present

## 2021-08-23 DIAGNOSIS — E785 Hyperlipidemia, unspecified: Secondary | ICD-10-CM | POA: Insufficient documentation

## 2021-08-23 DIAGNOSIS — I77810 Thoracic aortic ectasia: Secondary | ICD-10-CM | POA: Insufficient documentation

## 2021-08-23 DIAGNOSIS — C50919 Malignant neoplasm of unspecified site of unspecified female breast: Secondary | ICD-10-CM | POA: Diagnosis not present

## 2021-08-23 DIAGNOSIS — I110 Hypertensive heart disease with heart failure: Secondary | ICD-10-CM | POA: Diagnosis not present

## 2021-08-23 DIAGNOSIS — I5030 Unspecified diastolic (congestive) heart failure: Secondary | ICD-10-CM | POA: Diagnosis not present

## 2021-08-23 LAB — ECHOCARDIOGRAM COMPLETE
AR max vel: 3.54 cm2
AV Area VTI: 3.8 cm2
AV Area mean vel: 3.53 cm2
AV Mean grad: 8 mmHg
AV Peak grad: 14.4 mmHg
Ao pk vel: 1.9 m/s
Area-P 1/2: 3.37 cm2
S' Lateral: 4 cm

## 2021-08-23 NOTE — Telephone Encounter (Signed)
Called a few times but no answer at home or cell. Lvm for patient to call back ?

## 2021-08-28 DIAGNOSIS — J310 Chronic rhinitis: Secondary | ICD-10-CM | POA: Diagnosis not present

## 2021-08-30 ENCOUNTER — Other Ambulatory Visit: Payer: Self-pay

## 2021-08-30 DIAGNOSIS — E039 Hypothyroidism, unspecified: Secondary | ICD-10-CM

## 2021-08-30 NOTE — Telephone Encounter (Signed)
Patient was scheduled to come in for [facility] tomorrow ?

## 2021-08-31 ENCOUNTER — Other Ambulatory Visit (INDEPENDENT_AMBULATORY_CARE_PROVIDER_SITE_OTHER): Payer: Medicare Other

## 2021-08-31 DIAGNOSIS — E039 Hypothyroidism, unspecified: Secondary | ICD-10-CM

## 2021-08-31 LAB — TSH: TSH: 5.18 u[IU]/mL (ref 0.35–5.50)

## 2021-09-03 ENCOUNTER — Other Ambulatory Visit: Payer: Self-pay | Admitting: Internal Medicine

## 2021-09-03 ENCOUNTER — Ambulatory Visit (INDEPENDENT_AMBULATORY_CARE_PROVIDER_SITE_OTHER): Payer: Medicare Other | Admitting: Internal Medicine

## 2021-09-03 ENCOUNTER — Encounter: Payer: Self-pay | Admitting: Internal Medicine

## 2021-09-03 ENCOUNTER — Other Ambulatory Visit: Payer: Self-pay

## 2021-09-03 VITALS — BP 148/100 | HR 76 | Ht 65.0 in | Wt 315.6 lb

## 2021-09-03 DIAGNOSIS — E1159 Type 2 diabetes mellitus with other circulatory complications: Secondary | ICD-10-CM

## 2021-09-03 DIAGNOSIS — Z85850 Personal history of malignant neoplasm of thyroid: Secondary | ICD-10-CM | POA: Diagnosis not present

## 2021-09-03 DIAGNOSIS — E1165 Type 2 diabetes mellitus with hyperglycemia: Secondary | ICD-10-CM | POA: Diagnosis not present

## 2021-09-03 DIAGNOSIS — E890 Postprocedural hypothyroidism: Secondary | ICD-10-CM

## 2021-09-03 MED ORDER — METFORMIN HCL ER 500 MG PO TB24: 2000.0000 mg | ORAL_TABLET | Freq: Every day | ORAL | 3 refills | Status: DC

## 2021-09-03 MED ORDER — OZEMPIC (1 MG/DOSE) 4 MG/3ML ~~LOC~~ SOPN: 1.0000 mg | PEN_INJECTOR | SUBCUTANEOUS | 3 refills | Status: DC

## 2021-09-03 NOTE — Progress Notes (Signed)
[name], female   DOB: 05/26/1951, 71 y.o.   MRN: [id number]  This visit occurred during the SARS-CoV-2 public health emergency.  Safety protocols were in place, including screening questions prior to the visit, additional usage of staff PPE, and extensive cleaning of exam room while observing appropriate contact time as indicated for disinfecting solutions.   HPI: [name] is a 71 y.o.-year-old female, initially referred by her PCP, Dr. Charlett Blake, returning for follow-up for DM2, dx in 2019 after her breast cancer treatment, non-insulin-dependent, uncontrolled, with complications (CAD, dCHF), h/o papillary thyroid cancer and postsurgical hypothyroidism.  Last visit 1.5 months ago.  Interim history: No increased urination, blurry vision, nausea, chest pain. She does describe persistent leg swelling.  DM2:  Reviewed HbA1c: Lab Results  Component Value Date   HGBA1C 8.6 (H) 07/16/2021   HGBA1C 8.7 (H) 03/20/2021   HGBA1C 9.7 (H) 12/06/2020   HGBA1C 8.3 (H) 09/05/2020   HGBA1C 9.5 (H) 06/02/2020   HGBA1C 7.0 (H) 09/06/2019   HGBA1C 6.3 (H) 07/02/2018   HGBA1C 9.5 (H) 03/26/2018   HGBA1C 8.7 (H) 12/22/2017   HGBA1C 7.5 (H) 09/22/2017   Pt is on a regimen of: - Metformin 1000 mg 2x a day, with meals - Glimepiride 1 mg with breakfast >> moved before breakfast - Victoza 1.2 mg at bedtime- as she had abdominal pain with the 1.8 mg dose >> changed to Ozempic 0.25 >> 0.5 mg weekly She was previously on Ozempic  [facility].  He was tolerating it well but she had to stop it when she stopped going to the weight management clinic during the coronavirus pandemic.  Pt checks her sugars 1-2x a day: - am: 118-122 >> 187-233 - 2h after b'fast: n/c - before lunch: n/c  - 2h after lunch: n/c>> 120-255 - before dinner: n/c >> 125-166 - 2h after dinner: n/c >> 171 - bedtime: n/c >> 164-185 - nighttime: n/c Lowest sugar was >100 >> 120; ?  hypoglycemia awareness. Highest sugar was 200 - in the hospital >> 278  Glucometer: Accu-Chek  Pt's meals are: - Breakfast: eggs, sometimes sandwich with sliced [place], yoghurt - Lunch: sandwich or taco with [place] - Dinner: green bean + onion soup sauce + potato + grilled chicken - Snacks: yasso bars, cucumber Diet green tea/soda.  - no CKD, last BUN/creatinine:  Lab Results  Component Value Date   BUN 18 07/16/2021   BUN 20 03/20/2021   CREATININE 0.77 07/16/2021   CREATININE 0.85 03/20/2021  She is is on losartan 100 mg daily.  -+ HL; last set of lipids: Lab Results  Component Value Date   CHOL 113 07/16/2021   HDL 32.70 (L) 07/16/2021   LDLCALC 67 06/02/2020   LDLDIRECT 56.0 07/16/2021   TRIG 212.0 (H) 07/16/2021   CHOLHDL 3 07/16/2021  She is on Crestor 40 mg daily, Zetia 10 mg daily.  - last eye exam was in Summer 2022. No DR reportedly.+ cataracts.   - no numbness and tingling in her feet.  Pt has FH of DM in [place].  She also has a history of postsurgical hypothyroidism:  She is on levothyroxine 250 mcg daily: - in am - fasting - at least 30-60 min from b'fast - no calcium - no iron - no multivitamins - + PPIs (Nexium) now >4h after Levothyroxine - not on Biotin  Reviewed previous TFTs: Lab Results  Component Value Date   TSH 5.18 08/31/2021   TSH 8.97 (  H) 07/16/2021   TSH 7.93 (H) 06/22/2021   TSH 21.15 (H) 05/07/2021   TSH 21.89 (H) 03/20/2021   TSH 29.57 (H) 12/06/2020   TSH 12.31 (A) 08/04/2020   TSH 1.84 09/06/2019   TSH 2.130 03/26/2018   TSH 1.87 12/22/2017   TSH 0.81 09/22/2017   TSH 1.31 05/22/2017   TSH 1.84 02/17/2017   TSH 0.95 05/02/2016   TSH 1.22 10/16/2015   TSH 0.89 04/17/2015   TSH 1.20 10/13/2014   TSH 1.34 07/11/2014   TSH 0.557 12/31/2013   TSH 0.997 09/02/2013   Lab Results  Component Value Date   FREET4 1.33 03/26/2018   No FH of thyroid cancer. No h/o radiation tx to head or neck. No recent contrast studies.  No herbal supplements. No Biotin use. No recent steroids use.   She also has a history of a goiter >> s/p thyroidectomy 06/2020 >> papillary thyroid cancer  I reviewed the report of the neck CT (06/22/2020): Thyroid: Markedly diffusely enlarged and heterogeneous with multiple poorly defined nodules. This extends into the superior aspect of the superior mediastinum. At the thoracic inlet, this is causing moderate narrowing of the trachea with a transverse diameter of 9 mm. This measured 6 mm on the chest CTA.  A thyroid U/S (04/29/2016) showed no individual nodules: Parenchymal Echotexture: Markedly heterogenous Estimated total number of nodules >/= 1 cm: 0 Isthmus: 1.3 cm; Heterogeneous parenchyma, enlarged. Right lobe: 11.1 cm x 5.2 cm x 4.6 cm; Heterogeneous lobulated parenchyma with no focal nodule. Left lobe: 9.0 cm x 3.6 cm x 6.0 cm; Heterogeneous lobulated parenchyma with no focal nodule.   IMPRESSION: Enlarged multilobulated thyroid compatible with multinodular goiter and medical thyroid disease. No focal nodule identified.  Total thyroidectomy (07/24/2020): A: Substernal thyroid, total substernal thyroidectomy - Multinodular goiter, clinically substernal. - Incidental microscopic papillary thyroid carcinoma, 1 mm in greatest dimension, margins uninvolved. - See synoptic template below. - One lymph node from soft tissue adjacent to thyroid gland with no tumor seen (0/1).   B: Thyroid, pyramidal lobe tissue, excision - Benign thyroid tissue with adenomatous nodule, no tumor seen.   She also has a history of HTN, GERD, osteoarthritis, anxiety/depression, vitamin D deficiency, breast cancer, urinary incontinence, frequency, urgency, transaminitis, OSA -on CPAP.   No personal h/o pancreatitis or FH of MTC.  ROS: + See HPI, also: Constitutional: + fatigue ENT: + hypoacusis Cardiovascular: + SOB, + leg swelling Gastrointestinal: + acid reflux Musculoskeletal: + muscle, no  joint aches   Past Medical History:  Diagnosis Date   Anemia 04/05/2012   Anxiety    Anxiety state 09/11/2007   Qualifier: Diagnosis of  By: Scherrie Gerlach     Asthma    Atrial tachycardia, paroxysmal ([place])    Back pain 10/02/2014   Breast cancer ([place]) 02/17/2017   CAD (coronary artery disease), native coronary artery    remote Cath with nonobstructive ASCAD with 20% mid LAD, 20% prox left circ and 20% ostial RCA   Chicken pox as a child   Chronic diastolic CHF (congestive heart failure), NYHA class 1 ([place])    Complication of anesthesia    pt states feels different in her body after anesthesia when waking up and also experiences a smell of burnt plastic for approx a wk    Constipation    Depression    Diabetes ([place])    Dilated aortic root ([place])    42m by echo 08/2020   Dysuria 02/17/2017   Elevated LFTs 04/05/2012  GERD (gastroesophageal reflux disease)    Goiter    Heart murmur    hx of one at birth    History of kidney stones    History of radiation therapy 10/31/16-12/17/16   left breast 45 Gy in 25 fractions, left breast boost 16 Gy in 8 fractions   Hx of colonic polyps    Hyperlipidemia    Hypertension    Insomnia 10/08/2016   Joint pain    Malignant neoplasm of upper-outer quadrant of left female breast ([place]) 05/14/2016   not with patient   Measles as a child   Mumps as a child   OA (osteoarthritis) of knee 04/05/2012   Obesity 07/11/2014   PONV (postoperative nausea and vomiting)    Preventative health care 09/05/2013   PVC's (premature ventricular contractions) 05/02/2016   Shortness of breath dyspnea    walking distances / climbing stairs   Sleep apnea 04/05/2012   Tinea corporis 02/23/2013   Type 2 diabetes mellitus with hyperglycemia ([place]) 12/31/2013   Vasomotor rhinitis 04/05/2012   Ventral hernia    Wheezing    Past Surgical History:  Procedure Laterality Date   BREAST LUMPECTOMY Left 2017   BREAST LUMPECTOMY WITH RADIOACTIVE SEED AND SENTINEL LYMPH NODE  BIOPSY Left 06/21/2016   Procedure: LEFT BREAST LUMPECTOMY WITH RADIOACTIVE SEED AND SENTINEL LYMPH NODE BIOPSY, INJECT BLUE DYE LEFT BREAST;  Surgeon: Fanny Skates, MD;  Location: [facility];  Service: General;  Laterality: Left;   CARDIAC CATHETERIZATION     normal coroary arteries per patient   CARDIOVASCULAR STRESS TEST     10/12/2013   CESAREAN SECTION     X 3   CHOLECYSTECTOMY     COLONOSCOPY WITH PROPOFOL N/A 03/28/2015   Procedure: COLONOSCOPY WITH PROPOFOL;  Surgeon: Juanita Craver, MD;  Location: [facility];  Service: Endoscopy;  Laterality: N/A;   HERNIA REPAIR  02/08/11   ventral hernia   JOINT REPLACEMENT     bilateral   KNEE ARTHROSCOPY  05/2010   bilateral   LEFT AND RIGHT HEART CATHETERIZATION WITH CORONARY ANGIOGRAM N/A 11/08/2013   Procedure: LEFT AND RIGHT HEART CATHETERIZATION WITH CORONARY ANGIOGRAM;  Surgeon: [name], MD;  Location: [facility] CATH LAB;  Service: Cardiovascular;  Laterality: N/A;   MENISCUS REPAIR  2009   MOUTH SURGERY     teeth implants   PORT-A-CATH REMOVAL N/A 01/24/2017   Procedure: REMOVAL PORT-A-CATH;  Surgeon: Fanny Skates, MD;  Location: [facility];  Service: General;  Laterality: N/A;   PORTACATH PLACEMENT Right 07/16/2016   Procedure: INSERTION PORT-A-CATH RIGHT INTERNAL JUGULAR WITH ULTRASOUND;  Surgeon: Fanny Skates, MD;  Location: [facility];  Service: General;  Laterality: Right;   REPAIR DURAL / CSF LEAK  2021   performed at [facility] in [place]  2011   left   TOTAL KNEE ARTHROPLASTY Right 05/10/2014   Procedure: RIGHT TOTAL KNEE ARTHROPLASTY;  Surgeon: Mauri Pole, MD;  Location: [facility];  Service: Orthopedics;  Laterality: Right;   TUBAL LIGATION     WISDOM TOOTH EXTRACTION  2000   Social History   Socioeconomic History   Marital status: Divorced    Spouse name: Not on file   Number of children: 3   Years of education: Not on file   Highest education level: Not on file  Occupational  History   Occupation: retired - [employer]  Tobacco Use   Smoking status: Never   Smokeless tobacco: Never  Vaping Use  Vaping Use: Never used  Substance and Sexual Activity   Alcohol use: Not Currently    Comment: rarely   Drug use: No   Sexual activity: Never  Other Topics Concern   Not on file  Social History Narrative   Not on file   Social Determinants of Health   Financial Resource Strain: Low Risk    Difficulty of Paying Living Expenses: Not hard at all  Food Insecurity: No Food Insecurity   Worried About Charity fundraiser in the Last Year: Never true   [place] in the Last Year: Never true  Transportation Needs: No Transportation Needs   Lack of Transportation (Medical): No   Lack of Transportation (Non-Medical): No  Physical Activity: Insufficiently Active   Days of Exercise per Week: 3 days   Minutes of Exercise per Session: 10 min  Stress: No Stress Concern Present   Feeling of Stress : Not at all  Social Connections: Moderately Isolated   Frequency of Communication with Friends and Family: More than three times a week   Frequency of Social Gatherings with Friends and Family: More than three times a week   Attends Religious Services: More than 4 times per year   Active Member of [employer] or Organizations: No   Attends Archivist Meetings: Never   Marital Status: Divorced  Human resources officer Violence: Not At Risk   Fear of Current or Ex-Partner: No   Emotionally Abused: No   Physically Abused: No   Sexually Abused: No   Current Outpatient Medications on File Prior to Visit  Medication Sig Dispense Refill   acetaminophen (TYLENOL) 500 MG tablet Take 1,000 mg by mouth 2 (two) times daily as needed for moderate pain or headache.     albuterol (VENTOLIN HFA) 108 (90 Base) MCG/ACT inhaler Inhale 2 puffs every 4-6 hours as needed - rescue. 18 g 12   anastrozole (ARIMIDEX) 1 MG tablet Take 1 tablet (1 mg total) by mouth daily. 90 tablet 3   blood  glucose meter kit and supplies Dispense based on patient and insurance preference. Use up to four times daily as directed. (FOR ICD-10 E10.9, E11.9). 1 each 0   busPIRone (BUSPAR) 7.5 MG tablet Take 1 tablet (7.5 mg total) by mouth 3 (three) times daily. 90 tablet 3   carvedilol (COREG) 12.5 MG tablet Take 1 tablet 2 times daily with a meal. 180 tablet 1   diclofenac (VOLTAREN) 75 MG EC tablet TAKE 1 TABLET 2 TIMES A DAY 180 tablet 0   diltiazem (CARDIZEM CD) 120 MG 24 hr capsule Take 1 capsule (120 mg total) by mouth daily. 90 capsule 1   Docusate Calcium (STOOL SOFTENER PO) Take by mouth daily.     escitalopram (LEXAPRO) 20 MG tablet TAKE 1 TABLET EVERY MORNING AND 1/2 EVERY EVENING 135 tablet 1   esomeprazole (NEXIUM) 40 MG capsule Take 1 capsule (40 mg total) by mouth daily. 90 capsule 3   ezetimibe (ZETIA) 10 MG tablet Take 1 tablet (10 mg total) by mouth daily. 90 tablet 3   fenofibrate (TRICOR) 145 MG tablet Take 1 tablet (145 mg total) by mouth daily. 90 tablet 0   fluticasone (FLONASE) 50 MCG/ACT nasal spray Place 2 sprays into both nostrils daily. 16 g 6   fluticasone furoate-vilanterol (BREO ELLIPTA) 100-25 MCG/INH AEPB Inhale 1 puff then rinse mouth, once daily 60 each 12   furosemide (LASIX) 20 MG tablet Take 1 tablet (20 mg total) by mouth daily. [place]  tablet 1   glimepiride (AMARYL) 1 MG tablet TAKE ONE TABLET ONCE DAILY WITH BREAKFAST 30 tablet 3   glucose blood (ACCU-CHEK AVIVA) test strip Twice daily (please use brand that patient insurance covers) 100 each 5   Hypromellose (ARTIFICIAL TEARS OP) Place 1 drop into both eyes daily as needed (dry eyes).      levothyroxine (SYNTHROID) 200 MCG tablet Take 1 tablet (200 mcg total) by mouth daily. 90 tablet 1   levothyroxine (SYNTHROID) 50 MCG tablet TAKE ONE TABLET ONCE DAILY 30 tablet 0   loratadine (CLARITIN) 10 MG tablet Take 1 tablet (10 mg total) by mouth daily. 30 tablet 11   losartan (COZAAR) 100 MG tablet Take 1 tablet (100 mg  total) by mouth daily. 90 tablet 1   meclizine (ANTIVERT) 25 MG tablet Take 25 mg by mouth every 6 (six) hours as needed.     metFORMIN (GLUCOPHAGE) 500 MG tablet TAKE 2 TABLETS 2 TIMES A DAY WITH MEALS 360 tablet 1   montelukast (SINGULAIR) 10 MG tablet Take 1 tablet (10 mg total) by mouth at bedtime. 90 tablet 1   rosuvastatin (CRESTOR) 40 MG tablet Take 1 tablet (40 mg total) by mouth daily. 90 tablet 1   Semaglutide,0.25 or 0.5MG/DOS, (OZEMPIC, 0.25 OR 0.5 MG/DOSE,) 2 MG/1.5ML SOPN Inject 0.5 mg into the skin once a week. 4.5 mL 5   TRUEPLUS PEN NEEDLES 31G X 6 MM MISC USE WITH INSULIN UP TO 4 TIMES A DAY AS NEEDED 300 each 1   Vitamin D, Ergocalciferol, (DRISDOL) 1.25 MG (50000 UNIT) CAPS capsule Take 1 capsule (50,000 Units total) by mouth every 7 (seven) days. 4 capsule 4   No current facility-administered medications on file prior to visit.   Allergies  Allergen Reactions   No Known Allergies    Family History  Problem Relation Age of Onset   Thyroid disease Mother    Hyperlipidemia Mother    Depression Mother    Anxiety disorder Mother    Heart attack Father 83   Hypertension Father    Arthritis Father        RA   Coronary artery disease Father    High Cholesterol Father    Coronary artery disease Brother    Heart disease Brother    Cancer Maternal Aunt        colon   Cancer Maternal Grandmother        colon   Heart attack Maternal Grandfather    Alcohol abuse Paternal Grandfather     PE: BP (!) 148/100 (BP Location: Right Arm, Patient Position: Sitting, Cuff Size: Normal)    Pulse 76    Ht _0  (1.651 m)    Wt (!) 315 lb 9.6 oz (143.2 kg)    SpO2 94%    BMI 52.52 kg/m  Wt Readings from Last 3 Encounters:  09/03/21 (!) 315 lb 9.6 oz (143.2 kg)  08/13/21 (!) 321 lb (145.6 kg)  07/19/21 (!) 321 lb 12.8 oz (146 kg)   Constitutional: obese, in NAD Eyes: PERRLA, EOMI, no exophthalmos ENT: moist mucous membranes, no thyromegaly, no cervical  lymphadenopathy Cardiovascular: RRR, No MRG,  + B mild periankle edema Respiratory: CTA B Musculoskeletal: no deformities, strength intact in all 4 Skin: moist, warm, no rashes Neurological: no tremor with outstretched hands, DTR normal in all 4  ASSESSMENT: 1. DM2, non-insulin-dependent, uncontrolled, with complications - CAD - dCHF  2.  Postsurgical hypothyroidism  3.  Microscopic papillary thyroid cancer  PLAN:  1.  Patient with longstanding, uncontrolled, type 2 diabetes, on oral antidiabetic regimen with metformin and sulfonylurea, with an elevated HbA1c on this regimen, at 8.6% at last visit.  We switched from a daily to a weekly GLP-1 receptor agonist at that time, Ozempic.  She was able to increase the dose to 0.5 mg weekly.  She tolerates it well. We also moved glimepiride before breakfast, as she was taking this after the meal.  At last visit, I also suggested healthier dietary changes -At today's visit, her sugars are higher in the morning, all above target and they are slightly better later in the day, especially after Ozempic we will stop in her system.  However, her blood sugars are usually above goal, with only few values within the target range, before dinner.  Since she tolerates Ozempic well, I did suggest to increase the dose to 1 mg weekly.  She does have occasional diarrhea and I advised her to switch to metformin ER and, to improve her morning sugars more, I advised her to take the entire 2000 mg of metformin at dinnertime.  We also discussed about possibly using glimepiride before a larger meal later in the day, especially if eating out. - I suggested to:  Patient Instructions  Please change: - Metformin ER 2000 mg at dinnertime  Continue: - Glimepiride 1 mg before breakfast (and you can use this before a larger meal later in the day, also)  Increase: - Ozempic 1 mg weekly in a.m.   Continue Levothyroxine 250 mcg daily. Try to chew the tablet.  Take the thyroid  hormone every day, with water, at least 30 minutes before breakfast, separated by at least 4 hours from: - acid reflux medications - calcium - iron - multivitamins  Please return in 2 months with your sugar log.   - advised to check sugars at different times of the day - 1x a day, rotating check times - advised for yearly eye exams >> she is UTD - return to clinic in 2 months  2.  Postsurgical hypothyroidism - latest thyroid labs reviewed with pt. >> at the upper limit of normal: Lab Results  Component Value Date   TSH 5.18 08/31/2021  - she continues on LT4 250 mcg daily - pt. continues to feel fatigued - we discussed about taking the thyroid hormone every day, with water, >30 minutes before breakfast, separated by >4 hours from acid reflux medications, calcium, iron, multivitamins. Pt. was not taking it correctly at last visit, taking Nexium too close to the thyroid hormone.  I advised her to move Nexium at least 4 hours later.  She is not taking it in the afternoon. -At this visit, I advised her to try to chew the levothyroxine tablets for better absorption. -We will repeat her TFTs at next visit  3.  Microscopic papillary thyroid cancer - she had 1 focus of 1 mm papillary thyroid cancer per review [facility] from [facility]  -This was incidentally discovered on pathology after removal of compressive goiter  -At last visit I discussed with patient and her daughter that she is most likely cured and no intervention is needed -We will continue to monitor her clinically  Philemon Kingdom, MD PhD [facility] Endocrinology

## 2021-09-03 NOTE — Patient Instructions (Addendum)
Please change: ?- Metformin ER 2000 mg at dinnertime ? ?Continue: ?- Glimepiride 1 mg before breakfast (and you can use this before a larger meal later in the day, also) ? ?Increase: ?- Ozempic 1 mg weekly in a.m.  ? ?Continue Levothyroxine 250 mcg daily. Try to chew the tablet. ? ?Take the thyroid hormone every day, with water, at least 30 minutes before breakfast, separated by at least 4 hours from: ?- acid reflux medications ?- calcium ?- iron ?- multivitamins ? ?Please return in 2 months with your sugar log.  ?

## 2021-09-06 ENCOUNTER — Other Ambulatory Visit: Payer: Self-pay | Admitting: Family Medicine

## 2021-09-07 ENCOUNTER — Other Ambulatory Visit: Payer: Self-pay | Admitting: Family Medicine

## 2021-09-07 ENCOUNTER — Telehealth: Payer: Self-pay | Admitting: *Deleted

## 2021-09-07 NOTE — Chronic Care Management (AMB) (Signed)
?  Chronic Care Management  ? ?Outreach Note ? ?09/07/2021 ?Name: [name] MRN: [id number] DOB: Dec 31, 1950 ? ?Zyann Mabry is a 71 y.o. year old female who is a primary care patient of [name], MD. I reached out to [name] by phone today in response to a referral sent by Ms. Tory Emerald Carol's primary care provider. ? ?An unsuccessful telephone outreach was attempted today. The patient was referred to the case management team for assistance with care management and care coordination.  ? ?Follow Up Plan: A HIPAA compliant phone message was left for the patient providing contact information and requesting a return call.  ? ?Areyanna Figeroa, CCMA ?Care Guide, Embedded Care Coordination ?[place]  Care Management  ?Direct Dial: [phone] ? ? ?

## 2021-09-12 ENCOUNTER — Ambulatory Visit
Admission: RE | Admit: 2021-09-12 | Discharge: 2021-09-12 | Disposition: A | Discharge status: Home / Self Care | Payer: Medicare Other | Source: Ambulatory Visit | Attending: Family Medicine | Admitting: Family Medicine

## 2021-09-12 ENCOUNTER — Ambulatory Visit: Admission: RE | Admit: 2021-09-12 | Payer: Medicare Other | Source: Ambulatory Visit

## 2021-09-12 DIAGNOSIS — R922 Inconclusive mammogram: Secondary | ICD-10-CM | POA: Diagnosis not present

## 2021-09-12 DIAGNOSIS — N644 Mastodynia: Secondary | ICD-10-CM | POA: Diagnosis not present

## 2021-09-12 DIAGNOSIS — Z853 Personal history of malignant neoplasm of breast: Secondary | ICD-10-CM

## 2021-09-12 NOTE — Chronic Care Management (AMB) (Signed)
?  Chronic Care Management  ? ?Outreach Note ? ?09/12/2021 ?Name: [name] MRN: [id number] DOB: 11/27/1950 ? ?[name] is a 71 y.o. year old female who is a primary care patient of [name], MD. I reached out to [name] by phone today in response to a referral sent by Ms. [name]'s primary care provider. ? ?A second unsuccessful telephone outreach was attempted today. The patient was referred to the case management team for assistance with care management and care coordination.  ? ?Follow Up Plan: A HIPAA compliant phone message was left for the patient providing contact information and requesting a return call.  ? ?Joannah Gitlin, CCMA ?Care Guide, Embedded Care Coordination ?[place]  Care Management  ?Direct Dial: [phone] ? ? ?

## 2021-09-19 NOTE — Chronic Care Management (AMB) (Signed)
?  Care Management  ? ?Note ? ?09/19/2021 ?Name: [name] MRN: [id number] DOB: March 24, 1951 ? ?[name] is a 71 y.o. year old female who is a primary care patient of [name], MD. I reached out to [name] by phone today offer care coordination services.  ? ?[name] was given information about care management services today including:  ?Care management services include personalized support from designated clinical staff supervised by her physician, including individualized plan of care and coordination with other care providers ?24/7 contact phone numbers for assistance for urgent and routine care needs. ?The patient may stop care management services at any time by phone call to the office staff. ? ?Patient agreed to services and verbal consent obtained.  ? ?Follow up plan: ?Telephone appointment with care management team member scheduled for: 09/21/2021 ? ?Jaedin Trumbo, CCMA ?Care Guide, Embedded Care Coordination ?[facility]  Care Management  ?Direct Dial: [phone] ? ? ?

## 2021-09-21 ENCOUNTER — Ambulatory Visit (INDEPENDENT_AMBULATORY_CARE_PROVIDER_SITE_OTHER): Payer: Medicare Other | Admitting: Pharmacist

## 2021-09-21 DIAGNOSIS — J45909 Unspecified asthma, uncomplicated: Secondary | ICD-10-CM

## 2021-09-21 DIAGNOSIS — Z7984 Long term (current) use of oral hypoglycemic drugs: Secondary | ICD-10-CM

## 2021-09-21 DIAGNOSIS — Z7985 Long-term (current) use of injectable non-insulin antidiabetic drugs: Secondary | ICD-10-CM | POA: Diagnosis not present

## 2021-09-21 DIAGNOSIS — I110 Hypertensive heart disease with heart failure: Secondary | ICD-10-CM

## 2021-09-21 DIAGNOSIS — I2510 Atherosclerotic heart disease of native coronary artery without angina pectoris: Secondary | ICD-10-CM

## 2021-09-21 DIAGNOSIS — E785 Hyperlipidemia, unspecified: Secondary | ICD-10-CM | POA: Diagnosis not present

## 2021-09-21 DIAGNOSIS — E1159 Type 2 diabetes mellitus with other circulatory complications: Secondary | ICD-10-CM | POA: Diagnosis not present

## 2021-09-21 DIAGNOSIS — I5032 Chronic diastolic (congestive) heart failure: Secondary | ICD-10-CM

## 2021-09-21 DIAGNOSIS — I509 Heart failure, unspecified: Secondary | ICD-10-CM | POA: Diagnosis not present

## 2021-09-21 DIAGNOSIS — E119 Type 2 diabetes mellitus without complications: Secondary | ICD-10-CM

## 2021-09-21 DIAGNOSIS — E782 Mixed hyperlipidemia: Secondary | ICD-10-CM

## 2021-09-21 DIAGNOSIS — I10 Essential (primary) hypertension: Secondary | ICD-10-CM

## 2021-09-21 DIAGNOSIS — E039 Hypothyroidism, unspecified: Secondary | ICD-10-CM

## 2021-09-21 MED ORDER — LEVOTHYROXINE SODIUM 50 MCG PO TABS: 50.0000 ug | ORAL_TABLET | Freq: Every day | ORAL | 1 refills | Status: DC

## 2021-09-23 NOTE — Chronic Care Management (AMB) (Signed)
? ? ?Chronic Care Management ?Pharmacy Note ? ?09/25/2021 ?Name:  [name] MRN:  [id number] DOB:  1951/06/11 ? ?Summary: ?Patient states that cost of Ozempic and Memory Dance is expensive when she reaches Medicare Coverage gap. Based on last Ozempic cost of $66.95 patient is likely in Medicare coverage gap. Screened for LIS. Likely that patient will qualify for LIS / Extra help though Medicare (then cost of generic medications would be $0 to $4.95 and brand $10.95 with no increase in cost during coverage gap). Patient did not have financial information needed to apply for LIS today. Will follow up next week and apply for LIS.  ?If patient is denied LIS, then will apply for patient assistance program from manufacture for [place]. Might need to change from [facility] to Symbicort if applying for patient assistance program due to [facility] patient assistance program requirement of spending $600 out of pocket before can apply for Breo.  ?Also recommended patient start checking blood pressure at home. She plans to purchase blood pressure cuff with [facility] over-the-counter benefits. ?Hyperlipidemia - HDLC and triglycerides not at goal. Discussed benefits of weight loss, limiting intake of food high in carbohydrates and exercise in lowering TG and increasing HDLC. Recommend increase physical activity as able with goal of at least 150 minutes per week.  ?  ?Plan: patient to gather financial informaiton for follow up next week to applyor LIS ? ?Subjective: ?[name] is an 71 y.o. year old female who is a primary patient of [name], MD.  The CCM team was consulted for assistance with disease management and care coordination needs.   ? ?Engaged with patient by telephone for initial visit in response to provider referral for pharmacy case management and/or care coordination services.  ? ?Consent to Services:  ?The patient was given the following information about Chronic Care Management services today, agreed  to services, and gave verbal consent: 1. CCM service includes personalized support from designated clinical staff supervised by the primary care provider, including individualized plan of care and coordination with other care providers 2. 24/7 contact phone numbers for assistance for urgent and routine care needs. 3. Service will only be billed when office clinical staff spend 20 minutes or more in a month to coordinate care. 4. Only one practitioner may furnish and bill the service in a calendar month. 5.The patient may stop CCM services at any time (effective at the end of the month) by phone call to the office staff. 6. The patient will be responsible for cost sharing (co-pay) of up to 20% of the service fee (after annual deductible is met). Patient agreed to services and consent obtained. ? ?Patient Care Team: ?[name], MD as PCP - General (Family Medicine) ?Sueanne Margarita, MD as PCP - Cardiology (Cardiology) ?Fanny Skates, MD as Consulting Physician (General Surgery) ?Nicholas Lose, MD as Consulting Physician (Hematology and Oncology) ?Gery Pray, MD as Consulting Physician (Radiation Oncology) ?Gardenia Phlegm, NP as Nurse Practitioner (Hematology and Oncology) ?Wine, Argie Ramming, RN as [facility] ?Cherre Robins, RPH-CPP (Pharmacist) ? ?Recent office visits: ?07/16/2021 - Fam Med (Dr Charlett Blake) Seen for f/u chronic conditions and pain in left underarm. Labs checked. Referred to urogynogology for urinary incontinence. Labs showed Klebsiella UTI - prescribed cefdinir 368m bid for 5 days. Also recommended probiotic. Also low vitamin D - started 50,000IU weekly for 12 weeks and recommended to increase OTC vitamin D by 1000 IU daily.  ? ?Recent consult visits: ?09/03/2021 -  Endo (Dr Cruzita Lederer) F/U diabetes. Increased Ozempic to 67m weekly and changed meformin to ER - advised to take 20059mwiht evening meal. Continue glimepiride 46m76mefore breakfast. Continue  Levothyroxine 250 mcg daily. Try to chew the tablet. F/U 2 months.  ?[facility]20[facility] sin[facility]al [facility]. Continue nasal hygeive with isotonic nasal irrigations twice a day, continue CPAP. Resumed diclofenac and ASA for arthritis. F/U 6 months.  ? ?Hospital visits: ?None in previous 6 months ? ?Objective: ? ?Lab Results  ?Component Value Date  ? CREATININE 0.77 07/16/2021  ? CREATININE 0.85 03/20/2021  ? CREATININE 0.99 12/06/2020  ? ? ?Lab Results  ?Component Value Date  ? HGBA1C 8.6 (H) 07/16/2021  ? ?Last diabetic Eye exam:  ?Lab Results  ?Component Value Date/Time  ? HMDIABEYEEXA No Retinopathy 10/17/2020 12:00 AM  ?  ?Last diabetic Foot exam: No results found for: HMDIABFOOTEX  ? ?   ?Component Value Date/Time  ? CHOL 113 07/16/2021 1213  ? CHOL 123 07/14/2019 0739  ? TRIG 212.0 (H) 07/16/2021 1213  ? HDL 32.70 (L) 07/16/2021 1213  ? HDL 40 07/14/2019 0739  ? CHOLHDL 3 07/16/2021 1213  ? VLDL 42.4 (H) 07/16/2021 1213  ? [place] 06/02/2020 1427  ? LDLDIRECT 56.0 07/16/2021 1213  ? ? ? ?  Latest Ref Rng & Units 07/16/2021  ? 12:13 PM 03/20/2021  ? 10:55 AM 12/06/2020  ? 11:04 AM  ?Hepatic Function  ?Total Protein 6.0 - 8.3 g/dL 7.6   7.3   7.5    ?Albumin 3.5 - 5.2 g/dL 4.2   4.0   4.3    ?AST 0 - 37 U/L 50   58   44    ?ALT 0 - 35 U/L 32   46   49    ?Alk Phosphatase 39 - 117 U/L 77   78   70    ?Total Bilirubin 0.2 - 1.2 mg/dL 0.5   0.4   0.6    ? ? ?Lab Results  ?Component Value Date/Time  ? TSH 5.18 08/31/2021 10:27 AM  ? TSH 8.97 (H) 07/16/2021 12:13 PM  ? FREET4 1.33 03/26/2018 10:24 AM  ? ? ? ?  Latest Ref Rng & Units 07/16/2021  ? 12:13 PM 12/06/2020  ? 11:04 AM 09/05/2020  ?  8:46 AM  ?CBC  ?WBC 4.0 - 10.5 K/uL 7.2   8.6   9.4    ?Hemoglobin 12.0 - 15.0 g/dL 10.6   11.2   11.1    ?Hematocrit 36.0 - 46.0 % 34.9   36.6   35.4    ?Platelets 150.0 - 400.0 K/uL 265.0   298.0   288.0    ? ? ?Lab Results  ?Component Value Date/Time  ? VD25OH 13.17 (L) 07/16/2021  12:13 PM  ? VD25OH 17.38 (L) 12/06/2020 11:04 AM  ? ? ?Clinical ASCVD: Yes  ?The ASCVD Risk score (Arnett DK, et al., 2019) failed to calculate for the following reasons: ?  The valid total cholesterol range is 130 to 320 mg/dL   ? ?Other: (CHADS2VASc if Afib, PHQ9 if depression, MMRC or CAT for COPD, ACT, DEXA) ? ?Social History  ? ?Tobacco Use  ?Smoking Status Never  ?Smokeless Tobacco Never  ? ?BP Readings from Last 3 Encounters:  ?09/03/21 (!) 148/100  ?07/19/21 (!) 144/90  ?07/16/21 130/88  ? ?Pulse Readings from Last 3 Encounters:  ?09/03/21 76  ?07/19/21 86  ?07/16/21 89  ? ?Wt Readings from Last 3  Encounters:  ?09/03/21 (!) 315 lb 9.6 oz (143.2 kg)  ?08/13/21 (!) 321 lb (145.6 kg)  ?07/19/21 (!) 321 lb 12.8 oz (146 kg)  ? ? ?Assessment: Review of patient past medical history, allergies, medications, health status, including review of consultants reports, laboratory and other test data, was performed as part of comprehensive evaluation and provision of chronic care management services.  ? ?SDOH:  (Social Determinants of Health) assessments and interventions performed:  ?SDOH Interventions   ? ?Flowsheet Row Most Recent Value  ?SDOH Interventions   ?Physical Activity Interventions Other (Comments)  ? ?  ? ? ?[place] ? ?No Known Allergies ? ?Medications Reviewed Today   ? ? Reviewed by Cherre Robins, RPH-CPP (Pharmacist) on 09/24/21 at [place] List Status: <None>  ? ?Medication Order Taking? Sig Documenting Provider Last Dose Status Informant  ?acetaminophen (TYLENOL) 500 MG tablet [id number] Yes Take 1,000 mg by mouth 2 (two) times daily as needed for moderate pain or headache. [provider] Taking Active Self  ?albuterol (VENTOLIN HFA) 108 (90 Base) MCG/ACT inhaler [id number] Yes Inhale 2 puffs every 4-6 hours as needed - rescue. Baird Lyons D, MD Taking Active   ?anastrozole (ARIMIDEX) 1 MG tablet [id number] Yes Take 1 tablet (1 mg total) by mouth daily. Nicholas Lose, MD Taking Active    ?blood glucose meter kit and supplies [id number] Yes Dispense based on patient and insurance preference. Use up to four times daily as directed. (FOR ICD-10 E10.9, E11.9). [name], MD Taking Active   ?busP

## 2021-09-24 ENCOUNTER — Ambulatory Visit (INDEPENDENT_AMBULATORY_CARE_PROVIDER_SITE_OTHER): Payer: Medicare Other | Admitting: Pharmacist

## 2021-09-24 DIAGNOSIS — I2510 Atherosclerotic heart disease of native coronary artery without angina pectoris: Secondary | ICD-10-CM

## 2021-09-24 DIAGNOSIS — E119 Type 2 diabetes mellitus without complications: Secondary | ICD-10-CM

## 2021-09-24 DIAGNOSIS — E782 Mixed hyperlipidemia: Secondary | ICD-10-CM

## 2021-09-25 NOTE — Patient Instructions (Addendum)
[name] ?It was a pleasure speaking with you  ?Below is a summary of your health goals and care plan ? ?Patient Goals/Self-Care Activities ?take medications as prescribed,  ?check glucose daily, document, and provide at future appointments,  ?Blood glucose goals: ?Fasting blood glucose goal (before meals) = 80 to 130 ?Blood glucose goal after a meal = less than 180  ?check blood pressure daily, document, and provide at future appointments,  ?collaborate with provider on medication access solutions. Complete patient assistance program for Ozempic and Symbicort ?Target a minimum of 150 minutes of moderate intensity exercise weekly ?Focus on medication adherence by filling medications appropriately  ?Try buspirone 3 times a day especially prior to medical visits. This might help to keep blood pressure from increasing during visits.  ?Report any questions or concerns to PharmD and/or provider(s) ? ? ? ?I have scheduled a follow up phone visit for a check in with you on Tuesday, April 25th at 11am ? ?If you have any questions or concerns, please feel free to contact me either at the phone number below or with a MyChart message.  ? ?Keep up the good work! ? ?Cherre Robins, PharmD ?Clinical Pharmacist ?[facility] ?[place] ?[phone] (direct line)  ?[phone] (main office number) ? ? ?Chronic Care Management Care Plan ? ?Diabetes / obesity: ?Not at goal but improved. LDL goal < 7.5% ?Lab Results  ?Component Value Date  ? HGBA1C 8.6 (H) 07/16/2021  ? ?Weight has decreased 6lbs since changed to Ozempic.  ?Current treatment: ?Ozempic '1mg'$  - injection '1mg'$  subcutaneously once weekly ?Metfomrin XR '500mg'$  - take 4 tablets = '2000mg'$  daily with evening meal.  ?Glimepiride '1mg'$  daily prior to breakfast ?Interventions:  ?Return patient assistance program application once you complete patient portion ?Discussed A1c goals ?Reviewed home blood glucose readings and reviewed goals  ?Fasting blood glucose  goal (before meals) = 80 to 130 ?Blood glucose goal after a meal = less than 180  ?Continue current medication regimen for diabetes.  ? ? ?Hypertension / Heart Failure: ?Blood pressure uncontrolled; blood pressure goal < 140/90 ?Heart Failure controlled; Goal: minimize symptoms and exacerbations ? ?BP Readings from Last 3 Encounters:  ?09/03/21 (!) 148/100  ?07/19/21 (!) 144/90  ?07/16/21 130/88  ? ?Current treatment: ?Losartan '100mg'$  daily  ?Carvedilol 12.'5mg'$  twice a day ?Furosemide '20mg'$  daily  ?Diltiazem CD '120mg'$  daily  ?Interventions:  ?Recommended using [place] over-the-counter benenfit to purchase blood pressure cuff.  ?Recommended check blood pressure once a day, record and provide at future appointments ?Discussed limiting intake of sodium to '2300mg'$  or less per day.  ?Discussed signs and symptoms of CHF exacerbation - weight gain, SOB, abdominal fullness, swelling in legs or abdomen, Fatigue and weakness, changes in ability to perform usual activities, persistent cough or wheezing with white or pink blood-tinged mucus, nausea and lack of appetite ?Recommend check weight daily - report weight gain of more than 3 lbs in 24 hours or 5 lbs in 1 week. ? ?Hyperlipidemia / CAD:  ?LDL at goal of < 70; triglycerides above goal of < 150 and HDLC low - goal > 40 ?Lab Results  ?Component Value Date  ? CHOL 113 07/16/2021  ? HDL 32.70 (L) 07/16/2021  ? [place] 67 06/02/2020  ? LDLDIRECT 56.0 07/16/2021  ? TRIG 212.0 (H) 07/16/2021  ? CHOLHDL 3 07/16/2021  ? ?Current treatment:  ?Rosuvastatin '40mg'$  daily  ?Fenofibrate '145mg'$  daily  ?Ezetimibe '10mg'$  daily  ?Interventions:  ?Discussed adherence ?Recommend physical activity daily to lower  triglycerides and increase good cholesterol (HDLC). Goal is to work up to at least 150 minutes per week.  ? ?Anxiety:  ?Controlled but still gets anxious at medical office visits which causes blood pressure to increase ?Current treatment:  ?Buspirone 7.'5mg'$  - take 1 tablet 3 times a  day (patient reports she is not taking regularly) ?Escitalopram '20mg'$  - take 1 tablet each morning and 0.5 tablet each evening ?Interventions: ?Try buspirone 3 times a day especially prior to medical visits. This might help to keep blood pressure from increasing during visits.  ?Continue to take escitalopram at current dose  ? ?Medication management ?Pharmacist Clinical Goal(s): ?Over the next 90 days, patient will work with PharmD and providers to maintain optimal medication adherence ?Current pharmacy: [facility] ?Interventions ?Comprehensive medication review performed. ?Reviewed refill history and assessed adherence ?Continue current medication management strategy ?Assisting with cost solutions for Ozempic and Breo / Symbicort ? ? ?Follow Up Plan: Telephone follow up appointment with care management team member scheduled for:  2 to 4 weeks.  ? ?Patient verbalizes understanding of instructions and care plan provided today and agrees to view in [place]. Active MyChart status confirmed with patient.    ?

## 2021-09-25 NOTE — Patient Instructions (Addendum)
[name],  ? ?It was a pleasure speaking with you  ?Below is a summary of your health goals and care plan ? ?Patient Goals/Self-Care Activities ?take medications as prescribed,  ?check glucose daily, document, and provide at future appointments,  ?Blood glucose goals: ?Fasting blood glucose goal (before meals) = 80 to 130 ?Blood glucose goal after a meal = less than 180  ?check blood pressure daily, document, and provide at future appointments,  ?collaborate with provider on medication access solutions. Gather financial paperwork and we will apply for Medicare Extra help next week. ?Target a minimum of 150 minutes of moderate intensity exercise weekly ?Focus on medication adherence by filling medications appropriately  ?Try buspirone 3 times a day especially prior to medical visits. This might help to keep blood pressure from increasing during visits.  ?Report any questions or concerns to PharmD and/or provider(s) ? ? ? ?If you have any questions or concerns, please feel free to contact me either at the phone number below or with a MyChart message.  ? ?Keep up the good work! ? ?Cherre Robins, PharmD ?Clinical Pharmacist ?[facility] ?[place] ?[phone] (direct line)  ?[phone] (main office number) ? ?Chronic Care Management Care Plan ? ?Diabetes / obesity: ?Not at goal but improved. LDL goal < 7.5% ?Lab Results  ?Component Value Date  ? HGBA1C 8.6 (H) 07/16/2021  ? ?Weight has decreased 6lbs since changed to Ozempic.  ?Current treatment: ?Ozempic '1mg'$  - injection '1mg'$  subcutaneously once weekly ?Metfomrin XR '500mg'$  - take 4 tablets = '2000mg'$  daily with evening meal.  ?Glimepiride '1mg'$  daily prior to breakfast ?Interventions:  ?Gather financial information - we will check back in 1 week to apply for Medicare Extra Help ?Provided patient #1 sample of Ozempic. Lot #[id number] / Exp: 10/22/2023 ?Discussed A1c goals ?Reviewed home blood glucose readings and reviewed goals  ?Fasting blood  glucose goal (before meals) = 80 to 130 ?Blood glucose goal after a meal = less than 180  ?Continue current medication regimen for diabetes.  ? ? ?Hypertension / Heart Failure: ?Blood pressure uncontrolled; blood pressure goal < 140/90 ?Heart Failure controlled; Goal: minimize symptoms and exacerbations ? ?BP Readings from Last 3 Encounters:  ?09/03/21 (!) 148/100  ?07/19/21 (!) 144/90  ?07/16/21 130/88  ? ?Current treatment: ?Losartan '100mg'$  daily  ?Carvedilol 12.'5mg'$  twice a day ?Furosemide '20mg'$  daily  ?Diltiazem CD '120mg'$  daily  ?Interventions:  ?Recommended using [place] over-the-counter benenfit to purchase blood pressure cuff.  ?Recommended check blood pressure once a day, record and provide at future appointments ?Discussed limiting intake of sodium to '2300mg'$  or less per day.  ?Discussed signs and symptoms of CHF exacerbation - weight gain, SOB, abdominal fullness, swelling in legs or abdomen, Fatigue and weakness, changes in ability to perform usual activities, persistent cough or wheezing with white or pink blood-tinged mucus, nausea and lack of appetite ?Recommend check weight daily - report weight gain of more than 3 lbs in 24 hours or 5 lbs in 1 week. ? ?Hyperlipidemia / CAD:  ?LDL at goal of < 70; triglycerides above goal of < 150 and HDLC low - goal > 40 ?Lab Results  ?Component Value Date  ? CHOL 113 07/16/2021  ? HDL 32.70 (L) 07/16/2021  ? [place] 67 06/02/2020  ? LDLDIRECT 56.0 07/16/2021  ? TRIG 212.0 (H) 07/16/2021  ? CHOLHDL 3 07/16/2021  ? ?Current treatment:  ?Rosuvastatin '40mg'$  daily  ?Fenofibrate '145mg'$  daily  ?Ezetimibe '10mg'$  daily  ?Interventions:  ?Discussed adherence ?Recommend physical activity  daily to lower triglycerides and increase good cholesterol (HDLC). Goal is to work up to at least 150 minutes per week.  ? ?Anxiety:  ?Controlled but still gets anxious at medical office visits which causes blood pressure to increase ?Current treatment:  ?Buspirone 7.'5mg'$  - take 1 tablet 3  times a day (patient reports she is not taking regularly) ?Escitalopram '20mg'$  - take 1 tablet each morning and 0.5 tablet each evening ?Interventions: ?Try buspirone 3 times a day especially prior to medical visits. This might help to keep blood pressure from increasing during visits.  ?Continue to take escitalopram at current dose  ? ?Medication management ?Pharmacist Clinical Goal(s): ?Over the next 90 days, patient will work with PharmD and providers to maintain optimal medication adherence ?Current pharmacy: [facility] ?Interventions ?Comprehensive medication review performed. ?Reviewed refill history and assessed adherence ?Continue current medication management strategy ?Updated prescription for levothyroxine 49mg for 90 days supply ? ? ?Follow Up Plan: Telephone follow up appointment with care management team member scheduled for:  1 week   ? ?Patient verbalizes understanding of instructions and care plan provided today and agrees to view in [facility] Active MyChart status confirmed with patient.    ?

## 2021-09-25 NOTE — Chronic Care Management (AMB) (Signed)
? ? ?Chronic Care Management ?Pharmacy Note ? ?09/25/2021 ?Name:  [name] MRN:  [id number] DOB:  1951/06/24 ? ?Summary: ?Patient states that cost of Ozempic and Memory Dance is expensive when she reaches Medicare Coverage gap. Based on last Ozempic cost of $66.95 patient is likely in Medicare coverage gap. Screened for LIS but with social security income + annuities patient was over income limit. Started process for applying for patient assistance program for Ozempic and Symbicort (since [facility] patient assistance program requires $600 out of pocket spend before you can apply / qualify)  ?Hyperlipidemia - HDLC and triglycerides not at goal. Discussed benefits of weight loss, limiting intake of food high in carbohydrates and exercise in lowering TG and increasing HDLC. Recommend increase physical activity as able with goal of at least 150 minutes per week.  ?  ?Plan: 2 to 4 weeks to follow up on PAP ? ?Subjective: ?[name] is an 71 y.o. year old female who is a primary patient of [name], MD.  The CCM team was consulted for assistance with disease management and care coordination needs.   ? ?Engaged with patient by telephone for follow up visit in response to provider referral for pharmacy case management and/or care coordination services.  ? ?Consent to Services:  ?The patient was given information about Chronic Care Management services, agreed to services, and gave verbal consent prior to initiation of services.  Please see initial visit note for detailed documentation.  ? ?Patient Care Team: ?[name], MD as PCP - General (Family Medicine) ?Sueanne Margarita, MD as PCP - Cardiology (Cardiology) ?Fanny Skates, MD as Consulting Physician (General Surgery) ?Nicholas Lose, MD as Consulting Physician (Hematology and Oncology) ?Gery Pray, MD as Consulting Physician (Radiation Oncology) ?Gardenia Phlegm, NP as Nurse Practitioner (Hematology and Oncology) ?Wine, Argie Ramming, RN as [facility]  Orthoptist ?Cherre Robins, RPH-CPP (Pharmacist) ? ?Recent office visits: ?07/16/2021 - Fam Med (Dr Charlett Blake) Seen for f/u chronic conditions and pain in left underarm. Labs checked. Referred to urogynogology for urinary incontinence. Labs showed Klebsiella UTI - prescribed cefdinir 333m bid for 5 days. Also recommended probiotic. Also low vitamin D - started 50,000IU weekly for 12 weeks and recommended to increase OTC vitamin D by 1000 IU daily.  ? ?Recent consult visits: ?09/03/2021 - Endo (Dr GCruzita Lederer F/U diabetes. Increased Ozempic to 191mweekly and changed meformin to ER - advised to take 200058miht evening meal. Continue glimepiride 1mg51mfore breakfast. Continue Levothyroxine 250 mcg daily. Try to chew the tablet. F/U 2 months.  ?08/28/2021 [facility] for chronic sinonasal symptoms. Continue nasal hygeive with isotonic nasal irrigations twice a day, continue CPAP. Resumed diclofenac and ASA for arthritis. F/U 6 months.  ? ?Hospital visits: ?None in previous 6 months ? ?Objective: ? ?Lab Results  ?Component Value Date  ? CREATININE 0.77 07/16/2021  ? CREATININE 0.85 03/20/2021  ? CREATININE 0.99 12/06/2020  ? ? ?Lab Results  ?Component Value Date  ? HGBA1C 8.6 (H) 07/16/2021  ? ?Last diabetic Eye exam:  ?Lab Results  ?Component Value Date/Time  ? HMDIABEYEEXA No Retinopathy 10/17/2020 12:00 AM  ?  ?Last diabetic Foot exam: No results found for: HMDIABFOOTEX  ? ?   ?Component Value Date/Time  ? CHOL 113 07/16/2021 1213  ? CHOL 123 07/14/2019 0739  ? TRIG 212.0 (H) 07/16/2021 1213  ? HDL 32.70 (L) 07/16/2021 1213  ? HDL 40 07/14/2019 0739  ? CHOLHDL 3 07/16/2021 1213  ?  VLDL 42.4 (H) 07/16/2021 1213  ? [place] 67 06/02/2020 1427  ? LDLDIRECT 56.0 07/16/2021 1213  ? ? ? ?  Latest Ref Rng & Units 07/16/2021  ? 12:13 PM 03/20/2021  ? 10:55 AM 12/06/2020  ? 11:04 AM  ?Hepatic Function  ?Total Protein 6.0 - 8.3 g/dL 7.6   7.3   7.5    ?Albumin 3.5 - 5.2 g/dL 4.2    4.0   4.3    ?AST 0 - 37 U/L 50   58   44    ?ALT 0 - 35 U/L 32   46   49    ?Alk Phosphatase 39 - 117 U/L 77   78   70    ?Total Bilirubin 0.2 - 1.2 mg/dL 0.5   0.4   0.6    ? ? ?Lab Results  ?Component Value Date/Time  ? TSH 5.18 08/31/2021 10:27 AM  ? TSH 8.97 (H) 07/16/2021 12:13 PM  ? FREET4 1.33 03/26/2018 10:24 AM  ? ? ? ?  Latest Ref Rng & Units 07/16/2021  ? 12:13 PM 12/06/2020  ? 11:04 AM 09/05/2020  ?  8:46 AM  ?CBC  ?WBC 4.0 - 10.5 K/uL 7.2   8.6   9.4    ?Hemoglobin 12.0 - 15.0 g/dL 10.6   11.2   11.1    ?Hematocrit 36.0 - 46.0 % 34.9   36.6   35.4    ?Platelets 150.0 - 400.0 K/uL 265.0   298.0   288.0    ? ? ?Lab Results  ?Component Value Date/Time  ? VD25OH 13.17 (L) 07/16/2021 12:13 PM  ? VD25OH 17.38 (L) 12/06/2020 11:04 AM  ? ? ?Clinical ASCVD: Yes  ?The ASCVD Risk score (Arnett DK, et al., 2019) failed to calculate for the following reasons: ?  The valid total cholesterol range is 130 to 320 mg/dL   ? ?Other: (CHADS2VASc if Afib, PHQ9 if depression, MMRC or CAT for COPD, ACT, DEXA) ? ?Social History  ? ?Tobacco Use  ?Smoking Status Never  ?Smokeless Tobacco Never  ? ?BP Readings from Last 3 Encounters:  ?09/03/21 (!) 148/100  ?07/19/21 (!) 144/90  ?07/16/21 130/88  ? ?Pulse Readings from Last 3 Encounters:  ?09/03/21 76  ?07/19/21 86  ?07/16/21 89  ? ?Wt Readings from Last 3 Encounters:  ?09/03/21 (!) 315 lb 9.6 oz (143.2 kg)  ?08/13/21 (!) 321 lb (145.6 kg)  ?07/19/21 (!) 321 lb 12.8 oz (146 kg)  ? ? ?Assessment: Review of patient past medical history, allergies, medications, health status, including review of consultants reports, laboratory and other test data, was performed as part of comprehensive evaluation and provision of chronic care management services.  ? ?SDOH:  (Social Determinants of Health) assessments and interventions performed:  ? ? ? ?CCM Care Plan ? ?No Known Allergies ? ?Medications Reviewed Today   ? ? Reviewed by Cherre Robins, RPH-CPP (Pharmacist) on 09/24/21 at [place] List  Status: <None>  ? ?Medication Order Taking? Sig Documenting Provider Last Dose Status Informant  ?acetaminophen (TYLENOL) 500 MG tablet [id number] Yes Take 1,000 mg by mouth 2 (two) times daily as needed for moderate pain or headache. [provider] Taking Active Self  ?albuterol (VENTOLIN HFA) 108 (90 Base) MCG/ACT inhaler [id number] Yes Inhale 2 puffs every 4-6 hours as needed - rescue. Baird Lyons D, MD Taking Active   ?anastrozole (ARIMIDEX) 1 MG tablet [id number] Yes Take 1 tablet (1 mg total) by mouth daily. Nicholas Lose, MD Taking Active   ?  blood glucose meter kit and supplies [id number] Yes Dispense based on patient and insurance preference. Use up to four times daily as directed. (FOR ICD-10 E10.9, E11.9). [name], MD Taking Active   ?busPIRone (BUSPAR) 7.5 MG tablet [id number] Yes Take 1 tablet (7.5 mg total) by mouth 3 (three) times daily.  ?Patient taking differently: Take 7.5 mg by mouth 3 (three) times daily. As needed for anxiety  ? [name], MD Taking Active   ?carvedilol (COREG) 12.5 MG tablet [id number] Yes Take 1 tablet 2 times daily with a meal. [name], MD Taking Active   ?diclofenac (VOLTAREN) 75 MG EC tablet [id number] Yes TAKE 1 TABLET 2 TIMES A DAY [name], MD Taking Active   ?diltiazem (CARDIZEM CD) 120 MG 24 hr capsule [id number] Yes Take 1 capsule (120 mg total) by mouth daily. [name], MD Taking Active   ?Docusate Calcium (STOOL SOFTENER PO) [id number] Yes Take by mouth daily as needed. [provider] Taking Active   ?escitalopram (LEXAPRO) 20 MG tablet [id number] Yes TAKE 1 TABLET EVERY MORNING AND 1/2 EVERY EVENING [name], MD Taking Active   ?esomeprazole (NEXIUM) 40 MG capsule [id number] Yes Take 1 capsule (40 mg total) by mouth daily. [name], MD Taking Active   ?ezetimibe (ZETIA) 10 MG tablet [id number] Yes Take 1 tablet (10 mg total) by mouth daily. [name], MD Taking Active   ?fenofibrate (TRICOR) 145 MG  tablet [id number] Yes Take 1 tablet (145 mg total) by mouth daily. [name], MD Taking Active   ?fluticasone Asencion Islam) 50 MCG/ACT nasal spray [id number] Yes Place 2 sprays into both nostrils daily

## 2021-10-02 ENCOUNTER — Other Ambulatory Visit: Payer: Self-pay | Admitting: Family Medicine

## 2021-10-02 NOTE — Telephone Encounter (Signed)
Last 2 BP readings: 130/88, 151/82. Okay to fill? ?

## 2021-10-15 ENCOUNTER — Ambulatory Visit: Payer: Medicare Other | Admitting: Cardiology

## 2021-10-15 ENCOUNTER — Encounter: Payer: Self-pay | Admitting: Cardiology

## 2021-10-15 VITALS — BP 162/90 | HR 78 | Ht 66.0 in | Wt 314.6 lb

## 2021-10-15 DIAGNOSIS — I2510 Atherosclerotic heart disease of native coronary artery without angina pectoris: Secondary | ICD-10-CM

## 2021-10-15 DIAGNOSIS — I5032 Chronic diastolic (congestive) heart failure: Secondary | ICD-10-CM

## 2021-10-15 DIAGNOSIS — E7800 Pure hypercholesterolemia, unspecified: Secondary | ICD-10-CM | POA: Diagnosis not present

## 2021-10-15 DIAGNOSIS — I77810 Thoracic aortic ectasia: Secondary | ICD-10-CM | POA: Diagnosis not present

## 2021-10-15 DIAGNOSIS — I10 Essential (primary) hypertension: Secondary | ICD-10-CM

## 2021-10-15 DIAGNOSIS — I471 Supraventricular tachycardia: Secondary | ICD-10-CM

## 2021-10-15 NOTE — Patient Instructions (Signed)
Medication Instructions:  ?Your physician recommends that you continue on your current medications as directed. Please refer to the Current Medication list given to you today. ? ?*If you need a refill on your cardiac medications before your next appointment, please call your pharmacy* ? ? ?Lab Work: ?TODAY: FLP and ALT ?If you have labs (blood work) drawn today and your tests are completely normal, you will receive your results only by: ?MyChart Message (if you have MyChart) OR ?A paper copy in the mail ?If you have any lab test that is abnormal or we need to change your treatment, we will call you to review the results. ? ? ?Testing/Procedures: ?Your physician has requested that you have an echocardiogram in March 2024. Echocardiography is a painless test that uses sound waves to create images of your heart. It provides your doctor with information about the size and shape of your heart and how well your heart?s chambers and valves are working. This procedure takes approximately one hour. There are no restrictions for this procedure. ? ?Follow-Up: ?At [facility], you and your health needs are our priority.  As part of our continuing mission to provide you with exceptional heart care, we have created designated Provider Care Teams.  These Care Teams include your primary Cardiologist (physician) and Advanced Practice Providers (APPs -  Physician Assistants and Nurse Practitioners) who all work together to provide you with the care you need, when you need it. ? ?Your next appointment:   ?1 year(s) ? ?The format for your next appointment:   ?In Person ? ?Provider:   ?Fransico Him, MD   ? ? ?Important Information About Sugar ? ? ? ? ?  ?

## 2021-10-15 NOTE — Progress Notes (Signed)
? ?Date:  10/15/2021  ? ?ID:  Clotile Whittington, DOB 1950/08/10, MRN [id number] ? ?PCP:  [name], MD  ?Cardiologist:  Fransico Him, MD  ?Electrophysiologist:  None  ? ?Chief Complaint:  CHF, CAD, HTN, HLD ? ?History of Present Illness:   ? ?Kendel Pesnell is a 71 y.o. female  with a hx of HTN, chronic diastolic CHF, nonsustained atrial tachycardia, Cath with nonobstructive ASCAD with 20% mid LAD, 20% prox left circ and 20% ostial RCA with elevated LVEDP and dyslipidemia.  She has just finished all her chemo and XRT for breast CA. ? ?She is here today for followup and is doing well.  She denies any chest pain or pressure, SOB, DOE, PND, orthopnea, LE edema, dizziness, palpitations or syncope. She is compliant with her meds and is tolerating meds with no SE.    ? ?Prior CV studies:   ?The following studies were reviewed today: ? ?none ? ?Past Medical History:  ?Diagnosis Date  ? Anemia 04/05/2012  ? Anxiety   ? Anxiety state 09/11/2007  ? Qualifier: Diagnosis of  By: Scherrie Gerlach    ? Asthma   ? Atrial tachycardia, paroxysmal ([place])   ? Back pain 10/02/2014  ? Breast cancer ([place]) 02/17/2017  ? CAD (coronary artery disease), native coronary artery   ? remote Cath with nonobstructive ASCAD with 20% mid LAD, 20% prox left circ and 20% ostial RCA  ? Chicken pox as a child  ? Chronic diastolic CHF (congestive heart failure), NYHA class 1 ([place])   ? Complication of anesthesia   ? pt states feels different in her body after anesthesia when waking up and also experiences a smell of burnt plastic for approx a wk   ? Constipation   ? Depression   ? Diabetes ([place])   ? Dilated aortic root ([place])   ? 23m by echo 08/2020  ? Dysuria 02/17/2017  ? Elevated LFTs 04/05/2012  ? GERD (gastroesophageal reflux disease)   ? Goiter   ? Heart murmur   ? hx of one at birth   ? History of kidney stones   ? History of radiation therapy 10/31/16-12/17/16  ? left breast 45 Gy in 25 fractions, left breast boost 16 Gy in 8 fractions  ? Hx of  colonic polyps   ? Hyperlipidemia   ? Hypertension   ? Insomnia 10/08/2016  ? Joint pain   ? Malignant neoplasm of upper-outer quadrant of left [place] breast ([place] 05/14/2016  ? not with patient  ? Measles as a child  ? Mumps as a child  ? OA (osteoarthritis) of knee 04/05/2012  ? Obesity 07/11/2014  ? PONV (postoperative nausea and vomiting)   ? Preventative health care 09/05/2013  ? PVC's (premature ventricular contractions) 05/02/2016  ? Shortness of breath dyspnea   ? walking distances / climbing stairs  ? Sleep apnea 04/05/2012  ? Tinea corporis 02/23/2013  ? Type 2 diabetes mellitus with hyperglycemia ([place] 12/31/2013  ? Vasomotor rhinitis 04/05/2012  ? Ventral hernia   ? Wheezing   ? ?Past Surgical History:  ?Procedure Laterality Date  ? BREAST LUMPECTOMY Left 2017  ? BREAST LUMPECTOMY WITH RADIOACTIVE SEED AND SENTINEL LYMPH NODE BIOPSY Left 06/21/2016  ? Procedure: LEFT BREAST LUMPECTOMY WITH RADIOACTIVE SEED AND SENTINEL LYMPH NODE BIOPSY, INJECT BLUE DYE LEFT BREAST;  Surgeon: HFanny Skates MD;  [facility]  Service: General;  Laterality: Left;  ? CARDIAC CATHETERIZATION    ? normal coroary arteries per patient  ?  CARDIOVASCULAR STRESS TEST    ? 10/12/2013  ? CESAREAN SECTION    ? X 3  ? CHOLECYSTECTOMY    ? COLONOSCOPY WITH PROPOFOL N/A 03/28/2015  ? Procedure: COLONOSCOPY WITH PROPOFOL;  Surgeon: Juanita Craver, MD;  Location: [facility];  Service: Endoscopy;  Laterality: N/A;  ? HERNIA REPAIR  02/08/11  ? ventral hernia  ? JOINT REPLACEMENT    ? bilateral  ? KNEE ARTHROSCOPY  05/2010  ? bilateral  ? LEFT AND RIGHT HEART CATHETERIZATION WITH CORONARY ANGIOGRAM N/A 11/08/2013  ? Procedure: LEFT AND RIGHT HEART CATHETERIZATION WITH CORONARY ANGIOGRAM;  Surgeon: [name], MD;  [facility] C[facility] LAB;  Service: Cardiovascular;  Laterality: N/A;  ? MENISCUS REPAIR  2009  ? MOUTH SURGERY    ? teeth implants  ? PORT-A-CATH REMOVAL N/A 01/24/2017  ? Procedure: REMOVAL PORT-A-CATH;  Surgeon: Fanny Skates, MD;  Location: [place];  Service: General;  Laterality: N/A;  ? PORTACATH PLACEMENT Right 07/16/2016  ? Procedure: INSERTION PORT-A-CATH RIGHT INTERNAL JUGULAR WITH ULTRASOUND;  Surgeon: Fanny Skates, MD;  Location: [place];  Service: General;  Laterality: Right;  ? REPAIR DURAL / CSF LEAK  2021  ? performed at [facility] in [place]  ? TOTAL KNEE ARTHROPLASTY  2011  ? left  ? TOTAL KNEE ARTHROPLASTY Right 05/10/2014  ? Procedure: RIGHT TOTAL KNEE ARTHROPLASTY;  Surgeon: Mauri Pole, MD;  Location: [facility];  Service: Orthopedics;  Laterality: Right;  ? TUBAL LIGATION    ? [place] EXTRACTION  2000  ?  ? ?Current Meds  ?Medication Sig  ? acetaminophen (TYLENOL) 500 MG tablet Take 1,000 mg by mouth 2 (two) times daily as needed for moderate pain or headache.  ? albuterol (VENTOLIN HFA) 108 (90 Base) MCG/ACT inhaler Inhale 2 puffs every 4-6 hours as needed - rescue.  ? anastrozole (ARIMIDEX) 1 MG tablet Take 1 tablet (1 mg total) by mouth daily.  ? blood glucose meter kit and supplies Dispense based on patient and insurance preference. Use up to four times daily as directed. (FOR ICD-10 E10.9, E11.9).  ? busPIRone (BUSPAR) 7.5 MG tablet Take 1 tablet (7.5 mg total) by mouth 3 (three) times daily. (Patient taking differently: Take 7.5 mg by mouth 3 (three) times daily. As needed for anxiety)  ? carvedilol (COREG) 12.5 MG tablet Take 1 tablet 2 times daily with a meal.  ? diclofenac (VOLTAREN) 75 MG EC tablet TAKE 1 TABLET 2 TIMES A DAY  ? diltiazem (CARDIZEM CD) 120 MG 24 hr capsule Take 1 capsule (120 mg total) by mouth daily.  ? Docusate Calcium (STOOL SOFTENER PO) Take by mouth daily as needed.  ? escitalopram (LEXAPRO) 20 MG tablet TAKE 1 TABLET EVERY MORNING AND 1/2 EVERY EVENING  ? esomeprazole (NEXIUM) 40 MG capsule Take 1 capsule (40 mg total) by mouth daily.  ? ezetimibe (ZETIA) 10 MG tablet Take 1 tablet (10 mg total) by mouth daily.  ? fenofibrate (TRICOR) 145 MG tablet Take 1 tablet  (145 mg total) by mouth daily.  ? fluticasone (FLONASE) 50 MCG/ACT nasal spray Place 2 sprays into both nostrils daily.  ? fluticasone furoate-vilanterol (BREO ELLIPTA) 100-25 MCG/INH AEPB Inhale 1 puff then rinse mouth, once daily  ? furosemide (LASIX) 20 MG tablet Take 1 tablet (20 mg total) by mouth daily.  ? glimepiride (AMARYL) 1 MG tablet TAKE ONE TABLET ONCE DAILY WITH BREAKFAST (Patient taking differently: Take 1 mg by mouth daily before breakfast.)  ? glucose blood (ACCU-CHEK  AVIVA) test strip Twice daily (please use brand that patient insurance covers)  ? Hypromellose (ARTIFICIAL TEARS OP) Place 1 drop into both eyes daily as needed (dry eyes).   ? levothyroxine (SYNTHROID) 200 MCG tablet Take 1 tablet (200 mcg total) by mouth daily.  ? levothyroxine (SYNTHROID) 50 MCG tablet Take 1 tablet (50 mcg total) by mouth daily.  ? loratadine (CLARITIN) 10 MG tablet Take 1 tablet (10 mg total) by mouth daily.  ? losartan (COZAAR) 100 MG tablet Take 1 tablet (100 mg total) by mouth daily.  ? meclizine (ANTIVERT) 25 MG tablet Take 25 mg by mouth every 6 (six) hours as needed.  ? metFORMIN (GLUCOPHAGE-XR) 500 MG 24 hr tablet Take 4 tablets (2,000 mg total) by mouth daily with supper.  ? montelukast (SINGULAIR) 10 MG tablet Take 1 tablet (10 mg total) by mouth at bedtime.  ? rosuvastatin (CRESTOR) 40 MG tablet Take 1 tablet (40 mg total) by mouth daily.  ? Semaglutide, 1 MG/DOSE, (OZEMPIC, 1 MG/DOSE,) 4 MG/3ML SOPN Inject 1 mg into the skin once a week.  ? TRUEPLUS PEN NEEDLES 31G X 6 MM MISC USE WITH INSULIN UP TO 4 TIMES A DAY AS NEEDED  ? Vitamin D, Ergocalciferol, (DRISDOL) 1.25 MG (50000 UNIT) CAPS capsule Take 1 capsule (50,000 Units total) by mouth every 7 (seven) days.  ?  ? ?Allergies:   Patient has no known allergies.  ? ?Social History  ? ?Tobacco Use  ? Smoking status: Never  ? Smokeless tobacco: Never  ?Vaping Use  ? Vaping Use: Never used  ?Substance Use Topics  ? Alcohol use: Not Currently  ?  Comment:  rarely  ? Drug use: No  ?  ? ?Family Hx: ?The patient's family history includes Alcohol abuse in her paternal grandfather; Anxiety disorder in her mother; Arthritis in her father; Cancer in her maternal aunt a

## 2021-10-15 NOTE — Addendum Note (Signed)
Addended by: Antonieta Iba on: 10/15/2021 08:16 AM ? ? Modules accepted: Orders ? ?

## 2021-10-16 ENCOUNTER — Telehealth: Payer: Medicare Other

## 2021-10-16 ENCOUNTER — Other Ambulatory Visit: Payer: Self-pay | Admitting: Family Medicine

## 2021-10-16 ENCOUNTER — Telehealth: Payer: Self-pay

## 2021-10-16 DIAGNOSIS — E7800 Pure hypercholesterolemia, unspecified: Secondary | ICD-10-CM

## 2021-10-16 LAB — LIPID PANEL
Chol/HDL Ratio: 3.6 ratio (ref 0.0–4.4)
Cholesterol, Total: 112 mg/dL (ref 100–199)
HDL: 31 mg/dL — ABNORMAL LOW (ref >39)
LDL Chol Calc (NIH): 52 mg/dL (ref 0–99)
Triglycerides: 169 mg/dL — ABNORMAL HIGH (ref 0–149)
VLDL Cholesterol Cal: 29 mg/dL (ref 5–40)

## 2021-10-16 LAB — ALT: ALT: 35 IU/L — ABNORMAL HIGH (ref 0–32)

## 2021-10-16 MED ORDER — ICOSAPENT ETHYL 1 G PO CAPS: 2.0000 g | ORAL_CAPSULE | Freq: Two times a day (BID) | ORAL | 3 refills | Status: DC

## 2021-10-16 NOTE — Telephone Encounter (Signed)
The patient has been notified of the result and verbalized understanding.  All questions (if any) were answered. ?Antonieta Iba, RN 10/16/2021 2:23 PM  ?Rx has been sent in. Repeat labs have been scheduled.  ?

## 2021-10-16 NOTE — Telephone Encounter (Signed)
-----   Message from Sueanne Margarita, MD sent at 10/16/2021  8:57 AM EDT ----- ?LDL is at goal but triglycerides still too high.  Please add Vascepa 2 g twice daily and recheck FLP and ALT in 3 months ?

## 2021-10-21 DIAGNOSIS — I2510 Atherosclerotic heart disease of native coronary artery without angina pectoris: Secondary | ICD-10-CM | POA: Diagnosis not present

## 2021-10-21 DIAGNOSIS — E119 Type 2 diabetes mellitus without complications: Secondary | ICD-10-CM

## 2021-10-21 DIAGNOSIS — E782 Mixed hyperlipidemia: Secondary | ICD-10-CM

## 2021-10-23 ENCOUNTER — Ambulatory Visit (INDEPENDENT_AMBULATORY_CARE_PROVIDER_SITE_OTHER): Payer: Medicare Other | Admitting: Pharmacist

## 2021-10-23 DIAGNOSIS — E119 Type 2 diabetes mellitus without complications: Secondary | ICD-10-CM

## 2021-10-23 DIAGNOSIS — I2510 Atherosclerotic heart disease of native coronary artery without angina pectoris: Secondary | ICD-10-CM

## 2021-10-23 DIAGNOSIS — E782 Mixed hyperlipidemia: Secondary | ICD-10-CM

## 2021-10-23 NOTE — Chronic Care Management (AMB) (Signed)
? ? ?Chronic Care Management ?Pharmacy Note ? ?10/23/2021 ?Name:  [name] MRN:  [id number] DOB:  1950-12-31 ? ?Summary: ?Patient she has received applications for Ozempic and Symbicort (will replace Breo) yet, but her daughter picks up there mail and might have them. She will ask daughter and if she does not have the applications, will call me back so I can resend. She is in Medicare Coverage gap, last Ozempic cost was $250.06;  ?Patient reports she did not start generic Vascepa due to cost of >$100 (last triglycerides were 169 - down form 212).    ?Hyperlipidemia - HDLC and triglycerides not at goal. Discussed benefits of weight loss, limiting intake of food high in carbohydrates and exercise in lowering TG and increasing HDLC. Recommend increase physical activity as able with goal of at least 150 minutes per week.  ?  ?Plan: 2 weeks will see PCP (pick up sample for Ozempic left for patient and return medication assistance program applications) ? ?Subjective: ?[name] is an 71 y.o. year old female who is a primary patient of [name], MD.  The CCM team was consulted for assistance with disease management and care coordination needs.   ? ?Engaged with patient by telephone for follow up visit in response to provider referral for pharmacy case management and/or care coordination services.  ? ?Consent to Services:  ?The patient was given information about Chronic Care Management services, agreed to services, and gave verbal consent prior to initiation of services.  Please see initial visit note for detailed documentation.  ? ?Patient Care Team: ?[name], MD as PCP - General (Family Medicine) ?Sueanne Margarita, MD as PCP - Cardiology (Cardiology) ?Fanny Skates, MD as Consulting Physician (General Surgery) ?Nicholas Lose, MD as Consulting Physician (Hematology and Oncology) ?Gery Pray, MD as Consulting Physician (Radiation Oncology) ?Gardenia Phlegm, NP as Nurse  Practitioner (Hematology and Oncology) ?Wine, Argie Ramming, RN as [facility] ?Cherre Robins, RPH-CPP (Pharmacist) ? ?Recent office visits: ?07/16/2021 - Fam Med (Dr Charlett Blake) Seen for f/u chronic conditions and pain in left underarm. Labs checked. Referred to urogynogology for urinary incontinence. Labs showed Klebsiella UTI - prescribed cefdinir 392m bid for 5 days. Also recommended probiotic. Also low vitamin D - started 50,000IU weekly for 12 weeks and recommended to increase OTC vitamin D by 1000 IU daily.  ? ?Recent consult visits: ?10/15/2021 - Cardio (Dr TRadford Pax F/U CAD, CHF, HTN and HDL. Repeat ECHO in March 2024. Added Lovaza 2 grams twice a day due to elelvated Tg and elevated ALT ?09/03/2021 - Endo (Dr GCruzita Lederer F/U diabetes. Increased Ozempic to 162mweekly and changed meformin to ER - advised to take 200044miht evening meal. Continue glimepiride 1mg72mfore breakfast. Continue Levothyroxine 250 mcg daily. Try to chew the tablet. F/U 2 months.  ?08/28/2021 - [facility]ol[facility]ology at [facility] for chronic sinonasal symptoms. Continue nasal hygeive with isotonic nasal irrigations twice a day, continue CPAP. Resumed diclofenac and ASA for arthritis. F/U 6 months.  ? ?Hospital visits: ?None in previous 6 months ? ?Objective: ? ?Lab Results  ?Component Value Date  ? CREATININE 0.77 07/16/2021  ? CREATININE 0.85 03/20/2021  ? CREATININE 0.99 12/06/2020  ? ? ?Lab Results  ?Component Value Date  ? HGBA1C 8.6 (H) 07/16/2021  ? ?Last diabetic Eye exam:  ?Lab Results  ?Component Value Date/Time  ? HMDIABEYEEXA No Retinopathy 10/17/2020 12:00 AM  ?  ?Last diabetic Foot exam: No results found for: HMDIABFOOTEX  ? ?   ?  Component Value Date/Time  ? CHOL 112 10/15/2021 0822  ? TRIG 169 (H) 10/15/2021 3295  ? HDL 31 (L) 10/15/2021 1884  ? CHOLHDL 3.6 10/15/2021 0822  ? CHOLHDL 3 07/16/2021 1213  ? VLDL 42.4 (H) 07/16/2021 1213  ? [place] 52 10/15/2021 0822  ? [place] 67 06/02/2020 1427  ?  LDLDIRECT 56.0 07/16/2021 1213  ? ? ? ?  Latest Ref Rng & Units 10/15/2021  ?  8:22 AM 07/16/2021  ? 12:13 PM 03/20/2021  ? 10:55 AM  ?Hepatic Function  ?Total Protein 6.0 - 8.3 g/dL  7.6   7.3    ?Albumin 3.5 - 5.2 g/dL  4.2   4.0    ?AST 0 - 37 U/L  50   58    ?ALT 0 - 32 IU/L 35   32   46    ?Alk Phosphatase 39 - 117 U/L  77   78    ?Total Bilirubin 0.2 - 1.2 mg/dL  0.5   0.4    ? ? ?Lab Results  ?Component Value Date/Time  ? TSH 5.18 08/31/2021 10:27 AM  ? TSH 8.97 (H) 07/16/2021 12:13 PM  ? FREET4 1.33 03/26/2018 10:24 AM  ? ? ? ?  Latest Ref Rng & Units 07/16/2021  ? 12:13 PM 12/06/2020  ? 11:04 AM 09/05/2020  ?  8:46 AM  ?CBC  ?WBC 4.0 - 10.5 K/uL 7.2   8.6   9.4    ?Hemoglobin 12.0 - 15.0 g/dL 10.6   11.2   11.1    ?Hematocrit 36.0 - 46.0 % 34.9   36.6   35.4    ?Platelets 150.0 - 400.0 K/uL 265.0   298.0   288.0    ? ? ?Lab Results  ?Component Value Date/Time  ? VD25OH 13.17 (L) 07/16/2021 12:13 PM  ? VD25OH 17.38 (L) 12/06/2020 11:04 AM  ? ? ?Clinical ASCVD: Yes  ?The ASCVD Risk score (Arnett DK, et al., 2019) failed to calculate for the following reasons: ?  The valid total cholesterol range is 130 to 320 mg/dL   ? ?Other: (CHADS2VASc if Afib, PHQ9 if depression, MMRC or CAT for COPD, ACT, DEXA) ? ?Social History  ? ?Tobacco Use  ?Smoking Status Never  ?Smokeless Tobacco Never  ? ?BP Readings from Last 3 Encounters:  ?10/15/21 (!) 162/90  ?09/03/21 (!) 148/100  ?07/19/21 (!) 144/90  ? ?Pulse Readings from Last 3 Encounters:  ?10/15/21 78  ?09/03/21 76  ?07/19/21 86  ? ?Wt Readings from Last 3 Encounters:  ?10/15/21 (!) 314 lb 9.6 oz (142.7 kg)  ?09/03/21 (!) 315 lb 9.6 oz (143.2 kg)  ?08/13/21 (!) 321 lb (145.6 kg)  ? ? ?Assessment: Review of patient past medical history, allergies, medications, health status, including review of consultants reports, laboratory and other test data, was performed as part of comprehensive evaluation and provision of chronic care management services.  ? ?SDOH:  (Social  Determinants of Health) assessments and interventions performed:  ? ? ? ?CCM Care Plan ? ?No Known Allergies ? ?Medications Reviewed Today   ? ? Reviewed by Cherre Robins, RPH-CPP (Pharmacist) on 10/23/21 at 1539  Med List Status: <None>  ? ?Medication Order Taking? Sig Documenting Provider Last Dose Status Informant  ?acetaminophen (TYLENOL) 500 MG tablet [id number] Yes Take 1,000 mg by mouth 2 (two) times daily as needed for moderate pain or headache. [provider] Taking Active Self  ?albuterol (VENTOLIN HFA) 108 (90 Base) MCG/ACT inhaler [id number] Yes Inhale 2 puffs every 4-6 hours  as needed - rescue. Baird Lyons D, MD Taking Active   ?anastrozole (ARIMIDEX) 1 MG tablet [id number] Yes Take 1 tablet (1 mg total) by mouth daily. Nicholas Lose, MD Taking Active   ?aspirin EC 81 MG tablet [id number] Yes Take 81 mg by mouth daily. Swallow whole. [provider] Taking Active   ?blood glucose meter kit and supplies [id number] Yes Dispense based on patient and insurance preference. Use up to four times daily as directed. (FOR ICD-10 E10.9, E11.9). [name], MD Taking Active   ?busPIRone (BUSPAR) 7.5 MG tablet [id number] Yes Take 1 tablet (7.5 mg total) by mouth 3 (three) times daily.  ?Patient taking differently: Take 7.5 mg by mouth 3 (three) times daily. As needed for anxiety  ? [name], MD Taking Active   ?carvedilol (COREG) 12.5 MG tablet [id number] Yes Take 1 tablet 2 times daily with a meal. [name], MD Taking Active   ?diclofenac (VOLTAREN) 75 MG EC tablet [id number] Yes TAKE 1 TABLET 2 TIMES A DAY [name], MD Taking Active   ?diltiazem (CARDIZEM CD) 120 MG 24 hr capsule [id number] Yes Take 1 capsule (120 mg total) by mouth daily. [name], MD Taking Active   ?Docusate Calcium (STOOL SOFTENER PO) [id number] Yes Take by mouth daily as needed. [provider] Taking Active   ?escitalopram (LEXAPRO) 20 MG tablet [id number] Yes TAKE 1 TABLET EVERY MORNING  AND 1/2 EVERY EVENING [name], MD Taking Active   ?esomeprazole (NEXIUM) 40 MG capsule [id number] Yes Take 1 capsule (40 mg total) by mouth daily. [name], MD Taking Active   ?ezetimibe (ZETIA) 10 MG tablet 3

## 2021-10-23 NOTE — Patient Instructions (Signed)
[name] ?It was a pleasure speaking with you  ?Below is a summary of your health goals and care plan ? ?Patient Goals/Self-Care Activities ?take medications as prescribed,  ?check glucose daily, document, and provide at future appointments,  ?Blood glucose goals: ?Fasting blood glucose goal (before meals) = 80 to 130 ?Blood glucose goal after a meal = less than 180  ?check blood pressure daily, document, and provide at future appointments,  ?collaborate with provider on medication access solutions. Complete patient assistance program for Ozempic and Symbicort ?Target a minimum of 150 minutes of moderate intensity exercise weekly ?Focus on medication adherence by filling medications appropriately  ?Try buspirone 3 times a day especially prior to medical visits. This might help to keep blood pressure from increasing during visits.  ?Report any questions or concerns to PharmD and/or provider(s) ? ? ? ?If you have any questions or concerns, please feel free to contact me either at the phone number below or with a MyChart message.  ? ?Keep up the good work! ? ?[name], PharmD ?Clinical Pharmacist ?[facility] ?[place] ?[phone] (direct line)  ?[phone] (main office number) ? ? ? ?Diabetes / obesity: ?Not at goal but improved. LDL goal < 7.5% ?Lab Results  ?Component Value Date  ? HGBA1C 8.6 (H) 07/16/2021  ? ?Weight has decreased 6lbs since changed to Ozempic.  ?Current treatment: ?Ozempic '1mg'$  - injection '1mg'$  subcutaneously once weekly ?Metfomrin XR '500mg'$  - take 4 tablets = '2000mg'$  daily with evening meal.  ?Glimepiride '1mg'$  daily prior to breakfast ?Interventions:  ?Return patient assistance program application once you complete patient portion ?Discussed A1c goals ?Reviewed home blood glucose readings and reviewed goals  ?Fasting blood glucose goal (before meals) = 80 to 130 ?Blood glucose goal after a meal = less than 180  ?Continue current medication regimen for diabetes.   ? ? ?Hypertension / Heart Failure: ?Blood pressure uncontrolled; blood pressure goal < 140/90 ?Heart Failure controlled; Goal: minimize symptoms and exacerbations ? ?BP Readings from Last 3 Encounters:  ?10/15/21 (!) 162/90  ?09/03/21 (!) 148/100  ?07/19/21 (!) 144/90  ? ?Current treatment: ?Losartan '100mg'$  daily  ?Carvedilol 12.'5mg'$  twice a day ?Furosemide '20mg'$  daily  ?Diltiazem CD '120mg'$  daily  ?Interventions:  ?Recommended using [place] over-the-counter benenfit to purchase blood pressure cuff.  ?Recommended check blood pressure once a day, record and provide at future appointments ?Discussed limiting intake of sodium to '2300mg'$  or less per day.  ?Discussed signs and symptoms of CHF exacerbation - weight gain, SOB, abdominal fullness, swelling in legs or abdomen, Fatigue and weakness, changes in ability to perform usual activities, persistent cough or wheezing with white or pink blood-tinged mucus, nausea and lack of appetite ?Recommend check weight daily - report weight gain of more than 3 lbs in 24 hours or 5 lbs in 1 week. ? ?Hyperlipidemia / CAD:  ?LDL at goal of < 70; triglycerides above goal of < 150 and HDLC low - goal > 40 ?Lab Results  ?Component Value Date  ? CHOL 112 10/15/2021  ? HDL 31 (L) 10/15/2021  ? [place] 52 10/15/2021  ? LDLDIRECT 56.0 07/16/2021  ? TRIG 169 (H) 10/15/2021  ? CHOLHDL 3.6 10/15/2021  ? ?Current treatment:  ?Rosuvastatin '40mg'$  daily  ?Fenofibrate '145mg'$  daily  ?Ezetimibe '10mg'$  daily  ?Icosapent Ethyl (Vascepa)  1 gram - take 2 capsules twice a day ?Interventions:  ?Discussed adherence ?Recommend physical activity daily to lower triglycerides and increase good cholesterol (HDLC). Goal is to work up to  at least 150 minutes per week.  ? ?Anxiety:  ?Controlled but still gets anxious at medical office visits which causes blood pressure to increase ?Current treatment:  ?Buspirone 7.'5mg'$  - take 1 tablet 3 times a day (patient reports she is not taking regularly) ?Escitalopram '20mg'$   - take 1 tablet each morning and 0.5 tablet each evening ?Interventions: ?Try buspirone 3 times a day especially prior to medical visits. This might help to keep blood pressure from increasing during visits.  ?Continue to take escitalopram at current dose  ? ?Medication management ?Pharmacist Clinical Goal(s): ?Over the next 90 days, patient will work with PharmD and providers to maintain optimal medication adherence ?Current pharmacy: [facility] ?Interventions ?Comprehensive medication review performed. ?Reviewed refill history and assessed adherence ?Continue current medication management strategy ?Assisting with cost solutions for Ozempic and Breo / Symbicort ? ? ? ?Patient verbalizes understanding of instructions and care plan provided today and agrees to view in [place]. Active MyChart status confirmed with patient.    ?

## 2021-10-24 DIAGNOSIS — E119 Type 2 diabetes mellitus without complications: Secondary | ICD-10-CM | POA: Diagnosis not present

## 2021-10-24 LAB — HM DIABETES EYE EXAM

## 2021-11-01 ENCOUNTER — Other Ambulatory Visit: Payer: Self-pay | Admitting: *Deleted

## 2021-11-01 NOTE — Patient Outreach (Signed)
[facility]) Care Management ? ?11/01/2021 ? ?[name] ?[birth year] ?[phone] ? ?Unsuccessful outreach attempt made to patient. RN Health Coach left HIPAA compliant voicemail message along with her contact information. ? ?Plan: ?RN Health Coach will call patient within the month of June. ? ?Emelia Loron RN, BSN ?[facility] Care Management  ?RN Health Coach ?[phone] ?[email or web address] ? ? ?

## 2021-11-05 ENCOUNTER — Ambulatory Visit: Payer: Medicare Other | Admitting: Internal Medicine

## 2021-11-05 ENCOUNTER — Encounter: Payer: Self-pay | Admitting: Internal Medicine

## 2021-11-05 VITALS — BP 130/92 | HR 81 | Ht 66.0 in | Wt 312.2 lb

## 2021-11-05 DIAGNOSIS — E1159 Type 2 diabetes mellitus with other circulatory complications: Secondary | ICD-10-CM

## 2021-11-05 DIAGNOSIS — E1165 Type 2 diabetes mellitus with hyperglycemia: Secondary | ICD-10-CM | POA: Diagnosis not present

## 2021-11-05 DIAGNOSIS — E890 Postprocedural hypothyroidism: Secondary | ICD-10-CM

## 2021-11-05 DIAGNOSIS — Z85850 Personal history of malignant neoplasm of thyroid: Secondary | ICD-10-CM

## 2021-11-05 LAB — POCT GLYCOSYLATED HEMOGLOBIN (HGB A1C): Hemoglobin A1C: 9.1 % — AB (ref 4.0–5.6)

## 2021-11-05 MED ORDER — TRESIBA FLEXTOUCH 200 UNIT/ML ~~LOC~~ SOPN: 20.0000 [IU] | PEN_INJECTOR | Freq: Every day | SUBCUTANEOUS | 3 refills | Status: DC

## 2021-11-05 MED ORDER — INSULIN PEN NEEDLE 32G X 4 MM MISC: 3 refills | Status: DC

## 2021-11-05 NOTE — Progress Notes (Signed)
[name], female   DOB: 1951-03-14, 71 y.o.   MRN: [id number] ? ?This visit occurred during the SARS-CoV-2 public health emergency.  Safety protocols were in place, including screening questions prior to the visit, additional usage of staff PPE, and extensive cleaning of exam room while observing appropriate contact time as indicated for disinfecting solutions.  ? ?HPI: ?[name] is a 71 y.o.-year-old female, initially referred by her PCP, Dr. Charlett [name], returning for follow-up for DM2, dx in 2019 after her breast cancer treatment, non-insulin-dependent, uncontrolled, with complications (CAD, dCHF), h/o papillary thyroid cancer and postsurgical hypothyroidism.  Last visit 2 months ago. ? ?Interim history: ?No increased urination, blurry vision, nausea, chest pain.   ?She  continues to have leg swelling. ? ?DM2: ? ?Reviewed HbA1c: ?Lab Results  ?Component Value Date  ? HGBA1C 8.6 (H) 07/16/2021  ? HGBA1C 8.7 (H) 03/20/2021  ? HGBA1C 9.7 (H) 12/06/2020  ? HGBA1C 8.3 (H) 09/05/2020  ? HGBA1C 9.5 (H) 06/02/2020  ? HGBA1C 7.0 (H) 09/06/2019  ? HGBA1C 6.3 (H) 07/02/2018  ? HGBA1C 9.5 (H) 03/26/2018  ? HGBA1C 8.7 (H) 12/22/2017  ? HGBA1C 7.5 (H) 09/22/2017  ? ?Pt is on a regimen of: ?- Metformin 1000 mg 2x a day, with meals >> 2000 mg at night ?- Glimepiride 1 mg with breakfast >> moved before breakfast ?- Victoza 1.2 mg at bedtime- as she had abdominal pain with the 1.8 mg dose >> changed to Ozempic 0.25 >> 0.5 >> 1 mg weekly ?She was previously on [facility]em[facility].  He was tolerating it well but she had to stop it when she stopped going to the weight management clinic during the coronavirus pandemic. ? ?Pt checks her sugars 1-2x a day: ?- am: 118-122 >> 187-233 >> 171-235, 251 ?- 2h after b'fast: n/c ?- before lunch: n/c  ?- 2h after lunch: n/c>> 120-255 >> 120-180 ?- before dinner: n/c >> 125-166 >> 134-164 ?- 2h after dinner: n/c >> 171 >> 125-225 ?- bedtime:  n/c >> 164-185 >> 129, 214 ?- nighttime: n/c ?Lowest sugar was >100 >> 120 >> 120; ? hypoglycemia awareness. ?Highest sugar was 200 - in the hospital >> 278 >> 251 ? ?Glucometer: Accu-Chek ? ?Pt's meals are: ?- Breakfast: eggs, sometimes sandwich with sliced [place], yoghurt ?- Lunch: sandwich or taco with [place] ?- Dinner: green bean + onion soup sauce + potato + grilled chicken ?- Snacks: yasso bars, cucumber ?Diet green tea/soda. ? ?- no CKD, last BUN/creatinine:  ?Lab Results  ?Component Value Date  ? BUN 18 07/16/2021  ? BUN 20 03/20/2021  ? CREATININE 0.77 07/16/2021  ? CREATININE 0.85 03/20/2021  ?She is is on losartan 100 mg daily. ? ?-+ HL; last set of lipids: ?Lab Results  ?Component Value Date  ? CHOL 112 10/15/2021  ? HDL 31 (L) 10/15/2021  ? [place] 52 10/15/2021  ? LDLDIRECT 56.0 07/16/2021  ? TRIG 169 (H) 10/15/2021  ? CHOLHDL 3.6 10/15/2021  ?She is on Crestor 40 mg daily, Zetia 10 mg daily, Tricor 145 mg daily. ? ?- last eye exam was in Summer 2022. No DR reportedly.+ cataracts.  ? ?- no numbness and tingling in her feet. ? ?Pt has FH of DM in [place]. ? ?She also has a history of postsurgical hypothyroidism: ? ?She is on levothyroxine 250 mcg daily: ?- in am ?- fasting ?- at least 30-60 min from b'fast ?- no calcium ?- no iron ?- no multivitamins ?- +  PPIs (Nexium) now >4h after Levothyroxine ?- not on Biotin ? ?Reviewed previous TFTs: ?Lab Results  ?Component Value Date  ? TSH 5.18 08/31/2021  ? TSH 8.97 (H) 07/16/2021  ? TSH 7.93 (H) 06/22/2021  ? TSH 21.15 (H) 05/07/2021  ? TSH 21.89 (H) 03/20/2021  ? TSH 29.57 (H) 12/06/2020  ? TSH 12.31 (A) 08/04/2020  ? TSH 1.84 09/06/2019  ? TSH 2.130 03/26/2018  ? TSH 1.87 12/22/2017  ? TSH 0.81 09/22/2017  ? TSH 1.31 05/22/2017  ? TSH 1.84 02/17/2017  ? TSH 0.95 05/02/2016  ? TSH 1.22 10/16/2015  ? TSH 0.89 04/17/2015  ? TSH 1.20 10/13/2014  ? TSH 1.34 07/11/2014  ? TSH 0.557 12/31/2013  ? TSH 0.997 09/02/2013  ? ?Lab Results  ?Component Value Date  ? FREET4  1.33 03/26/2018  ? ?No FH of thyroid cancer. No h/o radiation tx to head or neck. ?No herbal supplements. No Biotin use. No recent steroids use.  ? ?She also has a history of a goiter >> s/p thyroidectomy 06/2020 >> papillary thyroid cancer ? ?I reviewed the report of the neck CT (06/22/2020): ?Thyroid: Markedly diffusely enlarged and heterogeneous with multiple ?poorly defined nodules. This extends into the superior aspect of the ?superior mediastinum. At the thoracic inlet, this is causing ?moderate narrowing of the trachea with a transverse diameter of 9 ?mm. This measured 6 mm on the chest CTA. ? ?A thyroid U/S (04/29/2016) showed no individual nodules: ?Parenchymal Echotexture: Markedly heterogenous ?Estimated total number of nodules >/= 1 cm: 0 ?Isthmus: 1.3 cm; Heterogeneous parenchyma, enlarged. ?Right lobe: 11.1 cm x 5.2 cm x 4.6 cm; Heterogeneous lobulated parenchyma with no focal nodule. ?Left lobe: 9.0 cm x 3.6 cm x 6.0 cm; Heterogeneous lobulated parenchyma with no focal nodule. ?  ?IMPRESSION: ?Enlarged multilobulated thyroid compatible with multinodular goiter ?and medical thyroid disease. No focal nodule identified. ? ?Total thyroidectomy (07/24/2020): ?A: Substernal thyroid, total substernal thyroidectomy ?- Multinodular goiter, clinically substernal. ?- Incidental microscopic papillary thyroid carcinoma, 1 mm in greatest dimension, margins uninvolved. ?- See synoptic template below. ?- One lymph node from soft tissue adjacent to thyroid gland with no tumor seen (0/1). ?  ?B: Thyroid, pyramidal lobe tissue, excision ?- Benign thyroid tissue with adenomatous nodule, no tumor seen.  ? ?She also has a history of HTN, GERD, osteoarthritis, anxiety/depression, vitamin D deficiency, breast cancer, urinary incontinence, frequency, urgency, transaminitis, OSA -on CPAP. ? ? No personal h/o pancreatitis or FH of MTC. ? ?ROS: + See HPI, also: ?ENT: + hypoacusis ?Cardiovascular: + SOB, + leg  swelling ?Gastrointestinal: + acid reflux ?Musculoskeletal: + muscle, no joint aches ? ? ?Past Medical History:  ?Diagnosis Date  ? Anemia 04/05/2012  ? Anxiety   ? Anxiety state 09/11/2007  ? Qualifier: Diagnosis of  By: Scherrie Gerlach    ? Asthma   ? Atrial tachycardia, paroxysmal ([place])   ? Back pain 10/02/2014  ? Breast cancer ([place]) 02/17/2017  ? CAD (coronary artery disease), native coronary artery   ? remote Cath with nonobstructive ASCAD with 20% mid LAD, 20% prox left circ and 20% ostial RCA  ? Chicken pox as a child  ? Chronic diastolic CHF (congestive heart failure), NYHA class 1 ([place])   ? Complication of anesthesia   ? pt states feels different in her body after anesthesia when waking up and also experiences a smell of burnt plastic for approx a wk   ? Constipation   ? Depression   ? Diabetes ([place])   ?  Dilated aortic root ([place])   ? 40m by echo 08/2020  ? Dysuria 02/17/2017  ? Elevated LFTs 04/05/2012  ? GERD (gastroesophageal reflux disease)   ? Goiter   ? Heart murmur   ? hx of one at birth   ? History of kidney stones   ? History of radiation therapy 10/31/16-12/17/16  ? left breast 45 Gy in 25 fractions, left breast boost 16 Gy in 8 fractions  ? Hx of colonic polyps   ? Hyperlipidemia   ? Hypertension   ? Insomnia 10/08/2016  ? Joint pain   ? Malignant neoplasm of upper-outer quadrant of left female breast ([place] 05/14/2016  ? not with patient  ? Measles as a child  ? Mumps as a child  ? OA (osteoarthritis) of knee 04/05/2012  ? Obesity 07/11/2014  ? PONV (postoperative nausea and vomiting)   ? Preventative health care 09/05/2013  ? PVC's (premature ventricular contractions) 05/02/2016  ? Shortness of breath dyspnea   ? walking distances / climbing stairs  ? Sleep apnea 04/05/2012  ? Tinea corporis 02/23/2013  ? Type 2 diabetes mellitus with hyperglycemia ([place] 12/31/2013  ? Vasomotor rhinitis 04/05/2012  ? Ventral hernia   ? Wheezing   ? ?Past Surgical History:  ?Procedure Laterality Date  ? BREAST LUMPECTOMY Left  2017  ? BREAST LUMPECTOMY WITH RADIOACTIVE SEED AND SENTINEL LYMPH NODE BIOPSY Left 06/21/2016  ? Procedure: LEFT BREAST LUMPECTOMY WITH RADIOACTIVE SEED AND SENTINEL LYMPH NODE BIOPSY, INJECT BLUE DYE LEFT BREAST;  Surge

## 2021-11-05 NOTE — Patient Instructions (Addendum)
Please continue: ?- Metformin ER 2000 mg at dinnertime ?- Glimepiride 1 mg before breakfast (and you can use this before a larger meal later in the day, also) ?- Ozempic 1 mg weekly in a.m.  ? ?Please start: ?- Tresiba U200 14 units daily and increase by 2-4 units every 4 days until sugars in am are <140 ? ?Continue Levothyroxine 250 mcg daily. ? ?Take the thyroid hormone every day, with water, at least 30 minutes before breakfast, separated by at least 4 hours from: ?- acid reflux medications ?- calcium ?- iron ?- multivitamins ? ?Please return in 3 months with your sugar log. ?

## 2021-11-06 ENCOUNTER — Other Ambulatory Visit: Payer: Self-pay | Admitting: Family Medicine

## 2021-11-08 ENCOUNTER — Ambulatory Visit: Payer: Medicare Other | Admitting: Family Medicine

## 2021-11-09 ENCOUNTER — Encounter: Payer: Self-pay | Admitting: Family

## 2021-11-09 ENCOUNTER — Ambulatory Visit: Payer: Medicare Other | Admitting: Family

## 2021-11-09 DIAGNOSIS — I10 Essential (primary) hypertension: Secondary | ICD-10-CM

## 2021-11-09 DIAGNOSIS — J309 Allergic rhinitis, unspecified: Secondary | ICD-10-CM | POA: Diagnosis not present

## 2021-11-09 DIAGNOSIS — E1159 Type 2 diabetes mellitus with other circulatory complications: Secondary | ICD-10-CM | POA: Diagnosis not present

## 2021-11-09 DIAGNOSIS — F32A Depression, unspecified: Secondary | ICD-10-CM | POA: Diagnosis not present

## 2021-11-09 DIAGNOSIS — F419 Anxiety disorder, unspecified: Secondary | ICD-10-CM | POA: Diagnosis not present

## 2021-11-09 DIAGNOSIS — E1165 Type 2 diabetes mellitus with hyperglycemia: Secondary | ICD-10-CM | POA: Diagnosis not present

## 2021-11-09 MED ORDER — GLIMEPIRIDE 1 MG PO TABS: 1.0000 mg | ORAL_TABLET | Freq: Every day | ORAL | 1 refills | Status: DC

## 2021-11-09 MED ORDER — ESCITALOPRAM OXALATE 20 MG PO TABS: ORAL_TABLET | ORAL | 1 refills | Status: DC

## 2021-11-09 MED ORDER — CARVEDILOL 25 MG PO TABS: 25.0000 mg | ORAL_TABLET | Freq: Two times a day (BID) | ORAL | 3 refills | Status: DC

## 2021-11-09 MED ORDER — DICLOFENAC SODIUM 75 MG PO TBEC: 75.0000 mg | DELAYED_RELEASE_TABLET | Freq: Two times a day (BID) | ORAL | 0 refills | Status: DC

## 2021-11-09 MED ORDER — BUSPIRONE HCL 7.5 MG PO TABS
7.5000 mg | ORAL_TABLET | Freq: Three times a day (TID) | ORAL | 5 refills | Status: DC
Start: 2021-11-09 — End: 2022-10-07

## 2021-11-09 MED ORDER — ROSUVASTATIN CALCIUM 40 MG PO TABS: 40.0000 mg | ORAL_TABLET | Freq: Every day | ORAL | 1 refills | Status: DC

## 2021-11-09 MED ORDER — BUSPIRONE HCL 7.5 MG PO TABS: 7.5000 mg | ORAL_TABLET | Freq: Three times a day (TID) | ORAL | 5 refills | Status: DC

## 2021-11-09 MED ORDER — LOSARTAN POTASSIUM 100 MG PO TABS: 100.0000 mg | ORAL_TABLET | Freq: Every day | ORAL | 1 refills | Status: DC

## 2021-11-09 MED ORDER — DILTIAZEM HCL ER COATED BEADS 120 MG PO CP24: 120.0000 mg | ORAL_CAPSULE | Freq: Every day | ORAL | 1 refills | Status: DC

## 2021-11-09 MED ORDER — LEVOTHYROXINE SODIUM 200 MCG PO TABS
200.0000 ug | ORAL_TABLET | Freq: Every day | ORAL | 1 refills | Status: DC
Start: 2021-11-09 — End: 2022-07-10

## 2021-11-09 NOTE — Assessment & Plan Note (Signed)
Stable on lexapro.  Continue same . 

## 2021-11-09 NOTE — Assessment & Plan Note (Signed)
Notes recent worsening symptoms. Continue claritin, singulair and flonase.

## 2021-11-09 NOTE — Assessment & Plan Note (Signed)
BP is uncontrolled. Will increase carvedilol form 12.5 mg bid to '25mg'$  bid.

## 2021-11-09 NOTE — Assessment & Plan Note (Signed)
Reports improvement in her sugars since she was placed on tresiba. Management per endo.

## 2021-11-09 NOTE — Progress Notes (Signed)
Subjective:     [name], female    DOB: 1951-06-22, 71 y.o.   MRN: [id number]  Chief Complaint  Patient presents with   Follow-up    HPI Patient is in today for follow up.  HTN- maintained on carvedilol, losartan, diltiazem.  BP Readings from Last 3 Encounters:  11/09/21 (!) 159/98  11/05/21 (!) 130/92  10/15/21 (!) 162/90   DM2- Weight is down a few pounds. 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  Wt Readings from Last 3 Encounters:  11/09/21 (!) 308 lb 9.6 oz (140 kg)  11/05/21 (!) 312 lb 3.2 oz (141.6 kg)  10/15/21 (!) 314 lb 9.6 oz (142.7 kg)    Lab Results  Component Value Date   HGBA1C 9.1 (A) 11/05/2021   HGBA1C 8.6 (H) 07/16/2021   HGBA1C 8.7 (H) 03/20/2021   Lab Results  Component Value Date   LDLCALC 52 10/15/2021   CREATININE 0.77 07/16/2021     Health Maintenance Due  Topic Date Due   Zoster Vaccines- Shingrix (2 of 2) 06/16/2019   FOOT EXAM  06/02/2021   OPHTHALMOLOGY EXAM  10/17/2021    Past Medical History:  Diagnosis Date   Anemia 04/05/2012   Anxiety    Anxiety state 09/11/2007   Qualifier: Diagnosis of  By: Sherren Mocha RN, Dorian Pod     Asthma    Atrial tachycardia, paroxysmal ([place])    Back pain 10/02/2014   Breast cancer ([place]) 02/17/2017   CAD (coronary artery disease), native coronary artery    remote Cath with nonobstructive ASCAD with 20% mid LAD, 20% prox left circ and 20% ostial RCA   Chicken pox as a child   Chronic diastolic CHF (congestive heart failure), NYHA class 1 ([place])    Complication of anesthesia    pt states feels different in her body after anesthesia when waking up and also experiences a smell of burnt plastic for approx a wk    Constipation    Depression    Diabetes ([place])    Dilated aortic root ([place])    63m by echo 08/2020   Dysuria 02/17/2017   Elevated LFTs 04/05/2012   GERD (gastroesophageal reflux disease)    Goiter     Heart murmur    hx of one at birth    History of kidney stones    History of radiation therapy 10/31/16-12/17/16   left breast 45 Gy in 25 fractions, left breast boost 16 Gy in 8 fractions   Hx of colonic polyps    Hyperlipidemia    Hypertension    Insomnia 10/08/2016   Joint pain    Malignant neoplasm of upper-outer quadrant of left female breast ([place] 05/14/2016   not with patient   Measles as a child   Mumps as a child   OA (osteoarthritis) of knee 04/05/2012   Obesity 07/11/2014   PONV (postoperative nausea and vomiting)    Preventative health care 09/05/2013   PVC's (premature ventricular contractions) 05/02/2016   Shortness of breath dyspnea    walking distances / climbing stairs   Sleep apnea 04/05/2012   Tinea corporis 02/23/2013   Type 2 diabetes mellitus with hyperglycemia ([place] 12/31/2013   Vasomotor rhinitis 04/05/2012   Ventral hernia    Wheezing     Past Surgical History:  Procedure Laterality Date   BREAST LUMPECTOMY Left 2017   BREAST LUMPECTOMY WITH RADIOACTIVE SEED AND SENTINEL LYMPH NODE BIOPSY Left 06/21/2016  Procedure: LEFT BREAST LUMPECTOMY WITH RADIOACTIVE SEED AND SENTINEL LYMPH NODE BIOPSY, INJECT BLUE DYE LEFT BREAST;  Surgeon: Fanny Skates, MD;  Location: [facility];  Service: General;  Laterality: Left;   CARDIAC CATHETERIZATION     normal coroary arteries per patient   CARDIOVASCULAR STRESS TEST     10/12/2013   CESAREAN SECTION     X 3   CHOLECYSTECTOMY     COLONOSCOPY WITH PROPOFOL N/A 03/28/2015   Procedure: COLONOSCOPY WITH PROPOFOL;  Surgeon: Juanita Craver, MD;  Location: [facility];  Service: Endoscopy;  Laterality: N/A;   HERNIA REPAIR  02/08/11   ventral hernia   JOINT REPLACEMENT     bilateral   KNEE ARTHROSCOPY  05/2010   bilateral   LEFT AND RIGHT HEART CATHETERIZATION WITH CORONARY ANGIOGRAM N/A 11/08/2013   Procedure: LEFT AND RIGHT HEART CATHETERIZATION WITH CORONARY ANGIOGRAM;  Surgeon: [name], MD;  Location: [facility] CATH LAB;   Service: Cardiovascular;  Laterality: N/A;   MENISCUS REPAIR  2009   MOUTH SURGERY     teeth implants   PORT-A-CATH REMOVAL N/A 01/24/2017   Procedure: REMOVAL PORT-A-CATH;  Surgeon: Fanny Skates, MD;  Location: [facility];  Service: General;  Laterality: N/A;   PORTACATH PLACEMENT Right 07/16/2016   Procedure: INSERTION PORT-A-CATH RIGHT INTERNAL JUGULAR WITH ULTRASOUND;  Surgeon: Fanny Skates, MD;  Location: [facility];  Service: General;  Laterality: Right;   REPAIR DURAL / CSF LEAK  2021   performed at [facility] in [place]  2011   left   TOTAL KNEE ARTHROPLASTY Right 05/10/2014   Procedure: RIGHT TOTAL KNEE ARTHROPLASTY;  Surgeon: Mauri Pole, MD;  Location: [facility];  Service: Orthopedics;  Laterality: Right;   TUBAL LIGATION     WISDOM TOOTH EXTRACTION  2000    Family History  Problem Relation Age of Onset   Thyroid disease Mother    Hyperlipidemia Mother    Depression Mother    Anxiety disorder Mother    Heart attack Father 108   Hypertension Father    Arthritis Father        RA   Coronary artery disease Father    High Cholesterol Father    Coronary artery disease Brother    Heart disease Brother    Cancer Maternal Aunt        colon   Cancer Maternal Grandmother        colon   Heart attack Maternal Grandfather    Alcohol abuse Paternal Grandfather     Social History   Socioeconomic History   Marital status: Divorced    Spouse name: Not on file   Number of children: 3   Years of education: Not on file   Highest education level: Not on file  Occupational History   Occupation: retired - [employer]  Tobacco Use   Smoking status: Never   Smokeless tobacco: Never  Vaping Use   Vaping Use: Never used  Substance and Sexual Activity   Alcohol use: Not Currently    Comment: rarely   Drug use: No   Sexual activity: Never  Other Topics Concern   Not on file  Social History Narrative   Not on file   Social Determinants of  Health   Financial Resource Strain: Low Risk    Difficulty of Paying Living Expenses: Not hard at all  Food Insecurity: No Food Insecurity   Worried About [place] in the Last Year: Never true  Ran Out of Food in the Last Year: Never true  Transportation Needs: No Transportation Needs   Lack of Transportation (Medical): No   Lack of Transportation (Non-Medical): No  Physical Activity: Insufficiently Active   Days of Exercise per Week: 4 days   Minutes of Exercise per Session: 10 min  Stress: No Stress Concern Present   Feeling of Stress : Not at all  Social Connections: Moderately Isolated   Frequency of Communication with Friends and Family: More than three times a week   Frequency of Social Gatherings with Friends and Family: More than three times a week   Attends Religious Services: More than 4 times per year   Active Member of [employer] or Organizations: No   Attends Archivist Meetings: Never   Marital Status: Divorced  Human resources officer Violence: Not At Risk   Fear of Current or Ex-Partner: No   Emotionally Abused: No   Physically Abused: No   Sexually Abused: No    Outpatient Medications Prior to Visit  Medication Sig Dispense Refill   acetaminophen (TYLENOL) 500 MG tablet Take 1,000 mg by mouth 2 (two) times daily as needed for moderate pain or headache.     albuterol (VENTOLIN HFA) 108 (90 Base) MCG/ACT inhaler Inhale 2 puffs every 4-6 hours as needed - rescue. 18 g 12   anastrozole (ARIMIDEX) 1 MG tablet Take 1 tablet (1 mg total) by mouth daily. 90 tablet 3   aspirin EC 81 MG tablet Take 81 mg by mouth daily. Swallow whole.     blood glucose meter kit and supplies Dispense based on patient and insurance preference. Use up to four times daily as directed. (FOR ICD-10 E10.9, E11.9). 1 each 0   Docusate Calcium (STOOL SOFTENER PO) Take by mouth daily as needed.     esomeprazole (NEXIUM) 40 MG capsule Take 1 capsule (40 mg total) by mouth daily. 90 capsule  3   ezetimibe (ZETIA) 10 MG tablet Take 1 tablet (10 mg total) by mouth daily. 90 tablet 3   fenofibrate (TRICOR) 145 MG tablet TAKE ONE TABLET ONCE DAILY 90 tablet 1   fluticasone (FLONASE) 50 MCG/ACT nasal spray Place 2 sprays into both nostrils daily. 16 g 6   fluticasone furoate-vilanterol (BREO ELLIPTA) 100-25 MCG/INH AEPB Inhale 1 puff then rinse mouth, once daily 60 each 12   furosemide (LASIX) 20 MG tablet Take 1 tablet (20 mg total) by mouth daily. 90 tablet 1   glucose blood (ACCU-CHEK AVIVA) test strip Twice daily (please use brand that patient insurance covers) 100 each 5   Hypromellose (ARTIFICIAL TEARS OP) Place 1 drop into both eyes daily as needed (dry eyes).      icosapent Ethyl (VASCEPA) 1 g capsule Take 2 capsules (2 g total) by mouth 2 (two) times daily. 360 capsule 3   insulin degludec (TRESIBA FLEXTOUCH) 200 UNIT/ML FlexTouch Pen Inject 20 Units into the skin daily. 9 mL 3   Insulin Pen Needle 32G X 4 MM MISC Use 1x a day 100 each 3   levothyroxine (SYNTHROID) 50 MCG tablet Take 1 tablet (50 mcg total) by mouth daily. 90 tablet 1   loratadine (CLARITIN) 10 MG tablet Take 1 tablet (10 mg total) by mouth daily. 30 tablet 11   meclizine (ANTIVERT) 25 MG tablet Take 25 mg by mouth every 6 (six) hours as needed.     metFORMIN (GLUCOPHAGE-XR) 500 MG 24 hr tablet Take 4 tablets (2,000 mg total) by mouth daily with supper. 360 tablet  3   montelukast (SINGULAIR) 10 MG tablet TAKE ONE TABLET AT BEDTIME 90 tablet 1   Semaglutide, 1 MG/DOSE, (OZEMPIC, 1 MG/DOSE,) 4 MG/3ML SOPN Inject 1 mg into the skin once a week. 9 mL 3   Vitamin D, Ergocalciferol, (DRISDOL) 1.25 MG (50000 UNIT) CAPS capsule Take 1 capsule (50,000 Units total) by mouth every 7 (seven) days. 4 capsule 4   busPIRone (BUSPAR) 7.5 MG tablet Take 1 tablet (7.5 mg total) by mouth 3 (three) times daily. (Patient taking differently: Take 7.5 mg by mouth 3 (three) times daily. As needed for anxiety) 90 tablet 3   carvedilol  (COREG) 12.5 MG tablet Take 1 tablet 2 times daily with a meal. 180 tablet 1   diclofenac (VOLTAREN) 75 MG EC tablet TAKE 1 TABLET 2 TIMES A DAY 180 tablet 0   diltiazem (CARDIZEM CD) 120 MG 24 hr capsule Take 1 capsule (120 mg total) by mouth daily. 90 capsule 1   escitalopram (LEXAPRO) 20 MG tablet TAKE 1 TABLET EVERY MORNING AND 1/2 EVERY EVENING 135 tablet 1   glimepiride (AMARYL) 1 MG tablet TAKE ONE TABLET ONCE DAILY WITH BREAKFAST (Patient taking differently: Take 1 mg by mouth daily before breakfast.) 30 tablet 3   levothyroxine (SYNTHROID) 200 MCG tablet Take 1 tablet (200 mcg total) by mouth daily. 90 tablet 1   losartan (COZAAR) 100 MG tablet Take 1 tablet (100 mg total) by mouth daily. 90 tablet 1   rosuvastatin (CRESTOR) 40 MG tablet Take 1 tablet (40 mg total) by mouth daily. 90 tablet 1   No facility-administered medications prior to visit.    No Known Allergies  ROS See HPI    Objective:    Physical Exam Constitutional:      General: She is not in acute distress.    Appearance: Normal appearance. She is well-developed.  HENT:     Head: Normocephalic and atraumatic.     Right Ear: External ear normal.     Left Ear: External ear normal.  Eyes:     General: No scleral icterus. Neck:     Thyroid: No thyromegaly.  Cardiovascular:     Rate and Rhythm: Normal rate and regular rhythm.     Heart sounds: Normal heart sounds. No murmur heard. Pulmonary:     Effort: Pulmonary effort is normal. No respiratory distress.     Breath sounds: Normal breath sounds. No wheezing.  Musculoskeletal:     Cervical back: Neck supple.  Skin:    General: Skin is warm and dry.  Neurological:     Mental Status: She is alert and oriented to person, place, and time.  Psychiatric:        Mood and Affect: Mood normal.        Behavior: Behavior normal.        Thought Content: Thought content normal.        Judgment: Judgment normal.    BP (!) 159/98 (BP Location: Left Arm, Patient  Position: Sitting, Cuff Size: Large)   Pulse 84   Resp 20   Ht _0  (1.676 m)   Wt (!) 308 lb 9.6 oz (140 kg)   SpO2 98%   BMI 49.81 kg/m  Wt Readings from Last 3 Encounters:  11/09/21 (!) 308 lb 9.6 oz (140 kg)  11/05/21 (!) 312 lb 3.2 oz (141.6 kg)  10/15/21 (!) 314 lb 9.6 oz (142.7 kg)       Assessment & Plan:   Problem List Items Addressed This Visit  Unprioritized   Poorly controlled type 2 diabetes mellitus with circulatory disorder ([place])    Reports improvement in her sugars since she was placed on tresiba. Management per endo.        Relevant Medications   glimepiride (AMARYL) 1 MG tablet   losartan (COZAAR) 100 MG tablet   rosuvastatin (CRESTOR) 40 MG tablet   Essential hypertension    BP is uncontrolled. Will increase carvedilol form 12.5 mg bid to 70m bid.        Relevant Medications   losartan (COZAAR) 100 MG tablet   diltiazem (CARDIZEM CD) 120 MG 24 hr capsule   rosuvastatin (CRESTOR) 40 MG tablet   carvedilol (COREG) 25 MG tablet   Anxiety and depression    Stable on lexapro. Continue same.        Relevant Medications   escitalopram (LEXAPRO) 20 MG tablet   busPIRone (BUSPAR) 7.5 MG tablet   Allergic rhinitis    Notes recent worsening symptoms. Continue claritin, singulair and flonase.         I have discontinued Jakiah A. Skluzacek's carvedilol. I have also changed her diclofenac and glimepiride. Additionally, I am having her start on carvedilol. Lastly, I am having her maintain her Hypromellose (ARTIFICIAL TEARS OP), acetaminophen, loratadine, fluticasone, meclizine, Docusate Calcium (STOOL SOFTENER PO), Breo Ellipta, albuterol, anastrozole, furosemide, ezetimibe, esomeprazole, Vitamin D (Ergocalciferol), glucose blood, blood glucose meter kit and supplies, metFORMIN, Ozempic (1 MG/DOSE), levothyroxine, aspirin EC, fenofibrate, icosapent Ethyl, Tresiba FlexTouch, Insulin Pen Needle, montelukast, escitalopram, losartan, diltiazem,  levothyroxine, rosuvastatin, and busPIRone.  Meds ordered this encounter  Medications   diclofenac (VOLTAREN) 75 MG EC tablet    Sig: Take 1 tablet (75 mg total) by mouth 2 (two) times daily.    Dispense:  180 tablet    Refill:  0    Order Specific Question:   Supervising Provider    Answer:   BPenni HomansA [[id number]]   escitalopram (LEXAPRO) 20 MG tablet    Sig: TAKE 1 TABLET EVERY MORNING AND 1/2 EVERY EVENING    Dispense:  135 tablet    Refill:  1    Order Specific Question:   Supervising Provider    Answer:   BPenni HomansA [[id number]]   glimepiride (AMARYL) 1 MG tablet    Sig: Take 1 tablet (1 mg total) by mouth daily before breakfast.    Dispense:  90 tablet    Refill:  1    Order Specific Question:   Supervising Provider    Answer:   BPenni HomansA [[id number]]   losartan (COZAAR) 100 MG tablet    Sig: Take 1 tablet (100 mg total) by mouth daily.    Dispense:  90 tablet    Refill:  1    Order Specific Question:   Supervising Provider    Answer:   BPenni HomansA [[id number]]   diltiazem (CARDIZEM CD) 120 MG 24 hr capsule    Sig: Take 1 capsule (120 mg total) by mouth daily.    Dispense:  90 capsule    Refill:  1    Order Specific Question:   Supervising Provider    Answer:   BPenni HomansA [[id number]]   levothyroxine (SYNTHROID) 200 MCG tablet    Sig: Take 1 tablet (200 mcg total) by mouth daily.    Dispense:  90 tablet    Refill:  1    PATIENT WANTS A 90 DAY SUPPLY, THANK YOU    Order Specific Question:   Supervising Provider  Answer:   Penni Homans A [[id number]]   rosuvastatin (CRESTOR) 40 MG tablet    Sig: Take 1 tablet (40 mg total) by mouth daily.    Dispense:  90 tablet    Refill:  1    Order Specific Question:   Supervising Provider    Answer:   Penni Homans A [[id number]]   DISCONTD: busPIRone (BUSPAR) 7.5 MG tablet    Sig: Take 1 tablet (7.5 mg total) by mouth 3 (three) times daily. As needed for anxiety    Dispense:  90 tablet    Refill:  5    Order Specific Question:    Supervising Provider    Answer:   Penni Homans A [[id number]]   carvedilol (COREG) 25 MG tablet    Sig: Take 1 tablet (25 mg total) by mouth 2 (two) times daily with a meal.    Dispense:  60 tablet    Refill:  3    Order Specific Question:   Supervising Provider    Answer:   Penni Homans A [[id number]]   busPIRone (BUSPAR) 7.5 MG tablet    Sig: Take 1 tablet (7.5 mg total) by mouth 3 (three) times daily. As needed for anxiety    Dispense:  90 tablet    Refill:  5    Order Specific Question:   Supervising Provider    Answer:   Penni Homans A [[id number]]

## 2021-11-09 NOTE — Patient Instructions (Signed)
Please increase carvedilol to '25mg'$  twice daily for blood pressure.

## 2021-11-12 ENCOUNTER — Telehealth: Payer: Self-pay | Admitting: Family Medicine

## 2021-11-12 NOTE — Telephone Encounter (Signed)
[name] stated she dropped off paperwork for [name] to fill out and was wanting to make sure that has been received. Please advise.

## 2021-11-13 ENCOUNTER — Ambulatory Visit: Payer: Medicare Other | Admitting: Pharmacist

## 2021-11-13 DIAGNOSIS — I2510 Atherosclerotic heart disease of native coronary artery without angina pectoris: Secondary | ICD-10-CM

## 2021-11-13 DIAGNOSIS — E1159 Type 2 diabetes mellitus with other circulatory complications: Secondary | ICD-10-CM

## 2021-11-13 DIAGNOSIS — I10 Essential (primary) hypertension: Secondary | ICD-10-CM

## 2021-11-13 DIAGNOSIS — E782 Mixed hyperlipidemia: Secondary | ICD-10-CM

## 2021-11-13 NOTE — Chronic Care Management (AMB) (Signed)
Chronic Care Management Pharmacy Note  11/13/2021 Name:  [name] MRN:  [id number] DOB:  02-14-1951  Summary: Patient completed applications for Ozempic, [name] and Symbicort (will replace Breo) and returned to office 11/09/2021. Reviewed applications and financial information provided. Completed provider portion of application. She is in Medicare Coverage gap, last Ozempic cost was $250.06 and last [name] was $109 (3 months supply). [name] was started 11/05/2021 due to blood glucose still in the 200's. She is taking Tresiba 14 units daily and blood glucose has improved. Was 167 this morning. She is planning to increase by 2 units with next dose to 16 units daily.  Hyperlipidemia - HDLC and triglycerides not at goal. Patient reports she did not start generic Vascepa due to cost of >$100 (last triglycerides were 169 - down from 212). Should continue to see Tg improve with improved blood glucose.  Discussed benefits of weight loss, limiting intake of food high in carbohydrates and exercise in lowering TG and increasing HDLC. Recommend increase physical activity as able with goal of at least 150 minutes per week.    Plan: 2 to 3 weeks to check patient assistance program and home blood glucose readings.   Subjective: [name] is an 71 y.o. year old female who is a primary patient of [name], MD.  The CCM team was consulted for assistance with disease management and care coordination needs.    Engaged with patient by telephone for follow up visit in response to provider referral for pharmacy case management and/or care coordination services.   Consent to Services:  The patient was given information about Chronic Care Management services, agreed to services, and gave verbal consent prior to initiation of services.  Please see initial visit note for detailed documentation.   Patient Care Team: [name], MD as PCP - General (Family Medicine) Sueanne Margarita,  MD as PCP - Cardiology (Cardiology) Fanny Skates, MD as Consulting Physician (General Surgery) Nicholas Lose, MD as Consulting Physician (Hematology and Oncology) Gery Pray, MD as Consulting Physician (Radiation Oncology) [name], NP as Nurse Practitioner (Hematology and Oncology) Michiel Cowboy, RN as [place], [place], [place] (Pharmacist)  Recent office visits: 11/09/2021 - Int Med [facility]) Recheck HTN and diabetes. No medication changes made 07/16/2021 - Fam Med (Dr Charlett Blake) Seen for f/u chronic conditions and pain in left underarm. Labs checked. Referred to urogynogology for urinary incontinence. Labs showed Klebsiella UTI - prescribed cefdinir 300mg  bid for 5 days. Also recommended probiotic. Also low vitamin D - started 50,000IU weekly for 12 weeks and recommended to increase OTC vitamin D by 1000 IU daily.   Recent consult visits: 11/05/2021 - Endo (Dr Cruzita Lederer) F/U DM. A1c was elelvated at 9.1%; FBG in morning still > 200. Continued metformin ER 2000 mg at dinnertime; Glimepiride 1 mg before breakfast (and you can use this before a larger meal later in the day, also) and Ozempic 1 mg weekly in a.m.  Started Tresiba U200 14 units daily and increase by 2-4 units every 4 days until sugars in am are <140 10/15/2021 - Cardio (Dr [name]) F/U CAD, CHF, HTN and HDL. Repeat ECHO in March 2024. Added Lovaza 2 grams twice a day due to elelvated Tg and elevated ALT 09/03/2021 - Endo (Dr Cruzita Lederer) F/U diabetes. Increased Ozempic to 1mg  weekly and changed meformin to ER - advised to take 2000mg  wiht evening meal. Continue glimepiride 1mg  before breakfast. Continue Levothyroxine 250 mcg daily. Try  to chew the tablet. F/U 2 months.  08/28/2021 - Otolaryngology at [facility] (Dr Bradd Burner) Evaluation for chronic sinonasal symptoms. Continue nasal hygeive with isotonic nasal irrigations twice a day, continue CPAP. Resumed diclofenac and ASA for arthritis. F/U 6  months.   Hospital visits: None in previous 6 months  Objective:  Lab Results  Component Value Date   CREATININE 0.77 07/16/2021   CREATININE 0.85 03/20/2021   CREATININE 0.99 12/06/2020    Lab Results  Component Value Date   HGBA1C 9.1 (A) 11/05/2021   Last diabetic Eye exam:  Lab Results  Component Value Date/Time   HMDIABEYEEXA No Retinopathy 10/17/2020 12:00 AM    Last diabetic Foot exam: No results found for: HMDIABFOOTEX      Component Value Date/Time   CHOL 112 10/15/2021 0822   TRIG 169 (H) 10/15/2021 0822   HDL 31 (L) 10/15/2021 0822   CHOLHDL 3.6 10/15/2021 0822   CHOLHDL 3 07/16/2021 1213   VLDL 42.4 (H) 07/16/2021 1213   LDLCALC 52 10/15/2021 0822   LDLCALC 67 06/02/2020 1427   LDLDIRECT 56.0 07/16/2021 1213       Latest Ref Rng & Units 10/15/2021    8:22 AM 07/16/2021   12:13 PM 03/20/2021   10:55 AM  Hepatic Function  Total Protein 6.0 - 8.3 g/dL  7.6   7.3    Albumin 3.5 - 5.2 g/dL  4.2   4.0    AST 0 - 37 U/L  50   58    ALT 0 - 32 IU/L 35   32   46    Alk Phosphatase 39 - 117 U/L  77   78    Total Bilirubin 0.2 - 1.2 mg/dL  0.5   0.4      Lab Results  Component Value Date/Time   TSH 5.18 08/31/2021 10:27 AM   TSH 8.97 (H) 07/16/2021 12:13 PM   FREET4 1.33 03/26/2018 10:24 AM       Latest Ref Rng & Units 07/16/2021   12:13 PM 12/06/2020   11:04 AM 09/05/2020    8:46 AM  CBC  WBC 4.0 - 10.5 K/uL 7.2   8.6   9.4    Hemoglobin 12.0 - 15.0 g/dL 10.6   11.2   11.1    Hematocrit 36.0 - 46.0 % 34.9   36.6   35.4    Platelets 150.0 - 400.0 K/uL 265.0   298.0   288.0      Lab Results  Component Value Date/Time   VD25OH 13.17 (L) 07/16/2021 12:13 PM   VD25OH 17.38 (L) 12/06/2020 11:04 AM    Clinical ASCVD: Yes  The ASCVD Risk score (Arnett DK, et al., 2019) failed to calculate for the following reasons:   The valid total cholesterol range is 130 to 320 mg/dL    Other: (CHADS2VASc if Afib, PHQ9 if depression, MMRC or CAT for COPD, ACT,  DEXA)  Social History   Tobacco Use  Smoking Status Never  Smokeless Tobacco Never   BP Readings from Last 3 Encounters:  11/09/21 (!) 159/98  11/05/21 (!) 130/92  10/15/21 (!) 162/90   Pulse Readings from Last 3 Encounters:  11/09/21 84  11/05/21 81  10/15/21 78   Wt Readings from Last 3 Encounters:  11/09/21 (!) 308 lb 9.6 oz (140 kg)  11/05/21 (!) 312 lb 3.2 oz (141.6 kg)  10/15/21 (!) 314 lb 9.6 oz (142.7 kg)    Assessment: Review of patient past medical history, allergies, medications, health status,  including review of consultants reports, laboratory and other test data, was performed as part of comprehensive evaluation and provision of chronic care management services.   SDOH:  (Social Determinants of Health) assessments and interventions performed:     CCM Care Plan  No Known Allergies  Medications Reviewed Today     Reviewed by Cherre Robins, RPH-CPP (Pharmacist) on 11/13/21 at 1349  Med List Status: <None>   Medication Order Taking? Sig Documenting Provider Last Dose Status Informant  acetaminophen (TYLENOL) 500 MG tablet [id number] Yes Take 1,000 mg by mouth 2 (two) times daily as needed for moderate pain or headache. [provider] Taking Active Self  albuterol (VENTOLIN HFA) 108 (90 Base) MCG/ACT inhaler [id number] Yes Inhale 2 puffs every 4-6 hours as needed - rescue. Deneise Lever, MD Taking Active   anastrozole (ARIMIDEX) 1 MG tablet [id number] Yes Take 1 tablet (1 mg total) by mouth daily. Nicholas Lose, MD Taking Active   aspirin EC 81 MG tablet [id number] Yes Take 81 mg by mouth daily. Swallow whole. [provider] Taking Active   blood glucose meter kit and supplies [id number] Yes Dispense based on patient and insurance preference. Use up to four times daily as directed. (FOR ICD-10 E10.9, E11.9). [name], MD Taking Active   busPIRone (BUSPAR) 7.5 MG tablet [id number] Yes Take 1 tablet (7.5 mg total) by mouth 3 (three) times  daily. As needed for anxiety Debbrah Alar, NP Taking Active   carvedilol (COREG) 25 MG tablet [id number] Yes Take 1 tablet (25 mg total) by mouth 2 (two) times daily with a meal. Debbrah Alar, NP Taking Active   diclofenac (VOLTAREN) 75 MG EC tablet [id number] Yes Take 1 tablet (75 mg total) by mouth 2 (two) times daily. Debbrah Alar, NP Taking Active   diltiazem (CARDIZEM CD) 120 MG 24 hr capsule [id number] Yes Take 1 capsule (120 mg total) by mouth daily. Debbrah Alar, NP Taking Active   Docusate Calcium (STOOL SOFTENER PO) [id number] Yes Take by mouth daily as needed. [provider] Taking Active   escitalopram (LEXAPRO) 20 MG tablet [id number] Yes TAKE 1 TABLET EVERY MORNING AND 1/2 EVERY EVENING Debbrah Alar, NP Taking Active   esomeprazole (NEXIUM) 40 MG capsule [id number] Yes Take 1 capsule (40 mg total) by mouth daily. [name], MD Taking Active   ezetimibe (ZETIA) 10 MG tablet [id number] Yes Take 1 tablet (10 mg total) by mouth daily. [name], MD Taking Active   fenofibrate (TRICOR) 145 MG tablet [id number] Yes TAKE ONE TABLET ONCE DAILY [name], MD Taking Active   fluticasone (FLONASE) 50 MCG/ACT nasal spray [id number] Yes Place 2 sprays into both nostrils daily. [name], MD Taking Active   fluticasone furoate-vilanterol (BREO ELLIPTA) 100-25 MCG/INH AEPB [id number] Yes Inhale 1 puff then rinse mouth, once daily Young, Clinton D, MD Taking Active   furosemide (LASIX) 20 MG tablet [id number] Yes Take 1 tablet (20 mg total) by mouth daily. [name], MD Taking Active   glimepiride (AMARYL) 1 MG tablet [id number] Yes Take 1 tablet (1 mg total) by mouth daily before breakfast. Debbrah Alar, NP Taking Active   glucose blood (ACCU-CHEK AVIVA) test strip [id number] Yes Twice daily (please use brand that patient insurance covers) [name], MD Taking Active   Hypromellose (ARTIFICIAL TEARS OP) [id number] Yes Place 1 drop  into both eyes daily as needed (dry eyes).  [provider] Taking Active Self  icosapent Ethyl (VASCEPA) 1 g capsule [id number]  Yes Take 2 capsules (2 g total) by mouth 2 (two) times daily. Sueanne Margarita, MD Taking Active   insulin degludec (TRESIBA FLEXTOUCH) 200 UNIT/ML FlexTouch Pen [id number] Yes Inject 20 Units into the skin daily.  Patient taking differently: Inject 14 Units into the skin daily.   Philemon Kingdom, MD Taking Active   Insulin Pen Needle 32G X 4 MM MISC [id number] Yes Use 1x a day Philemon Kingdom, MD Taking Active   levothyroxine (SYNTHROID) 200 MCG tablet [id number] Yes Take 1 tablet (200 mcg total) by mouth daily. Debbrah Alar, NP Taking Active   levothyroxine (SYNTHROID) 50 MCG tablet [id number] Yes Take 1 tablet (50 mcg total) by mouth daily. Colon Branch, MD Taking Active   loratadine (CLARITIN) 10 MG tablet [id number] Yes Take 1 tablet (10 mg total) by mouth daily. [name], MD Taking Active   losartan (COZAAR) 100 MG tablet [id number] Yes Take 1 tablet (100 mg total) by mouth daily. Debbrah Alar, NP Taking Active   meclizine (ANTIVERT) 25 MG tablet [id number] Yes Take 25 mg by mouth every 6 (six) hours as needed. [provider] Taking Active   metFORMIN (GLUCOPHAGE-XR) 500 MG 24 hr tablet [id number] Yes Take 4 tablets (2,000 mg total) by mouth daily with supper. Philemon Kingdom, MD Taking Active   montelukast (SINGULAIR) 10 MG tablet [id number] Yes TAKE ONE TABLET AT BEDTIME [name], MD Taking Active   rosuvastatin (CRESTOR) 40 MG tablet [id number] Yes Take 1 tablet (40 mg total) by mouth daily. Debbrah Alar, NP Taking Active   Semaglutide, 1 MG/DOSE, (OZEMPIC, 1 MG/DOSE,) 4 MG/3ML SOPN [id number] Yes Inject 1 mg into the skin once a week. Philemon Kingdom, MD Taking Active   Vitamin D, Ergocalciferol, (DRISDOL) 1.25 MG (50000 UNIT) CAPS capsule [id number] Yes Take 1 capsule (50,000 Units total) by mouth every 7 (seven)  days. [name], MD Taking Active             Patient Active Problem List   Diagnosis Date Noted   Allergic rhinitis 11/09/2021   Hx of papillary thyroid carcinoma 07/19/2021   Poorly controlled type 2 diabetes mellitus with circulatory disorder ([place]) 07/19/2021   Atypical chest pain 07/16/2021   Urinary incontinence 07/16/2021   Hypothyroidism 03/20/2021   Nausea 12/06/2020   Muscle cramps 12/06/2020   H/O thyroidectomy 08/30/2020   CSF leak from nose 01/13/2020   Uncontrolled type 2 diabetes mellitus with hyperglycemia ([place]) 04/14/2018   Vitamin D deficiency 03/30/2018   Dental infection 03/29/2018   Dysphagia 12/28/2017   Cough 12/28/2017   Peripheral neuropathy 09/22/2017   Lipoma 09/22/2017   Lymphedema 05/22/2017   Asthmatic bronchitis, mild intermittent, uncomplicated 96/75/9163   Breast cancer ([place]) 02/17/2017   Dysuria 02/17/2017   Insomnia 10/08/2016   Dilated aortic root ([place])    Port catheter in place 08/01/2016   Malignant neoplasm of upper-outer quadrant of left female breast ([place]) 05/14/2016   PVC's (premature ventricular contractions) 05/02/2016   Neck mass 04/21/2016   LVH (left ventricular hypertrophy) due to hypertensive disease, with heart failure ([place]) 04/18/2016   Tonsillar hypertrophy 07/07/2015   Back pain 10/02/2014   Obesity 07/11/2014   S/P right TKA 05/10/2014   S/P knee replacement 05/10/2014   Atrial tachycardia, paroxysmal ([place]) 02/22/2014   Type 2 diabetes mellitus with hyperglycemia ([facility]) 12/31/2013   Chronic diastolic CHF (congestive heart failure) ([place]) 11/22/2013   CAD (coronary artery disease), native coronary artery 11/22/2013   Dyspnea on exertion 11/03/2013   Undiagnosed  cardiac murmurs 09/05/2013   Preventative health care 09/05/2013   Musculoskeletal pain 11/10/2012   Vasomotor rhinitis 04/05/2012   Obstructive sleep apnea 04/05/2012   Anemia 04/05/2012   Elevated LFTs 04/05/2012   OA (osteoarthritis) of knee  04/05/2012   Hernia 10/13/2010   Hyperlipidemia 09/11/2007   Anxiety and depression 09/11/2007   Essential hypertension 09/11/2007   GERD 09/11/2007   RENAL CALCULUS 09/11/2007   RENAL CYST 09/11/2007   COLONIC POLYPS, HX OF 09/11/2007    Immunization History  Administered Date(s) Administered   Fluad Quad(high Dose 65+) 04/21/2019, 03/20/2021   Influenza Split 03/24/2012, 03/24/2013   Influenza, High Dose Seasonal PF 04/18/2016, 04/21/2017, 03/24/2018, 04/27/2020   Influenza-Unspecified 03/21/2014, 03/15/2015, 03/18/2015   Moderna Covid-19 Vaccine Bivalent Booster 3yrs & up 05/07/2021   Moderna SARS-COV2 Booster Vaccination 12/06/2020   Moderna Sars-Covid-2 Vaccination 07/29/2019, 08/27/2019, 04/19/2020   Pneumococcal Conjugate-13 04/18/2016   Pneumococcal Polysaccharide-23 05/22/2017   Tdap 10/02/2021   Zoster Recombinat (Shingrix) 04/21/2019   Zoster, Live 04/19/2011    Conditions to be addressed/monitored: CHF, CAD, HTN, HLD, DMII, Anxiety, Depression, Hypothyroidism, and history of breast cancer, history of papillary thyroid cancer with post surgical hypothyroidism; obesity  Care Plan : General Pharmacy (Adult)  Updates made by Cherre Robins, RPH-CPP since 11/13/2021 12:00 AM     Problem: Chronic Conditions: type 2 DM; HTN; CHF; CAD; allergies; h/o breast cancer; HDL; post surgical hypothyroidism   Priority: High  Onset Date: 09/17/2021  Note:   Current Barriers:  Unable to independently afford treatment regimen Unable to achieve control of hypertension and type 2 DM   Pharmacist Clinical Goal(s):  Over the next 90 days, patient will verbalize ability to afford treatment regimen achieve control of type 2 DM and HTN as evidenced by meeting goals listed below adhere to prescribed medication regimen as evidenced by refill history  through collaboration with PharmD and provider.   Interventions: 1:1 collaboration with [name], MD regarding development and  update of comprehensive plan of care as evidenced by provider attestation and co-signature Inter-disciplinary care team collaboration (see longitudinal plan of care) Comprehensive medication review performed; medication list updated in electronic medical record  Diabetes: Not at goal but improving; A1c goal < 7.5% Monitored by Dr Cruzita Lederer Current treatment: Metformin ER $RemoveBefo'500mg'IGQjqBwYqsN$  - take 4 tablets = $RemoveB'2000mg'cwQnniCr$  with evening meal Glipizide $RemoveBefore'1mg'RSxqoueIXidnw$  - take 1 talblet prior to breakfast Ozempic - inject $Remove'1mg'pSCftuv$  subcutaneously once a week.   Current glucose readings: fasting glucose: 100 to 140's; post prandial glucose: 120 to 200 Denies  hypoglycemic/hyperglycemic symptoms Current exercise: 10 minutes 3 or 4 days per week - chair and arm exercises or walks Initial weight prior to Ozempic = 321lbs  Interventions:  Educated on blood glucose goals Counseled on proper administration of medications Assessed patient finances. Plan to apply for LIS / Medicare Extra Help; if denied then will apply for patient assistance program for Ozempic.    Hypertension:  Uncontrolled/controlled current treatment:     Current home readings:   Denies/reports hypotensive/hypertensive symptoms Interventions:    Hyperlipidemia:  Uncontrolled/controlled; current treatment: ;   Medications previously tried:     Current dietary patterns:   Interventions:   Asthma:  Uncontrolled/controlled; current treatment: ;  ACT score:  Interventions:   Low Vitamin D  Medication management Pharmacist Clinical Goal(s): Over the next 90 days, patient will work with PharmD and providers to maintain optimal medication adherence Current pharmacy: [facility] Comprehensive medication review performed. Reviewed refill history and assessed adherence Continue  current medication management strategy Patient self care activities - Over the next 90 days, patient will: Focus on medication adherence by filling medications  appropriately  Take medications as prescribed Report any questions or concerns to PharmD and/or provider(s)  Patient Goals/Self-Care Activities Over the next 90 days, patient will:  take medications as prescribed, check glucose daily ar varying times of day, document, and provide at future appointments, and collaborate with provider on medication access solutions  Follow Up Plan: Telephone follow up appointment with care management team member scheduled for:  09/24/2021 - to apply for LIS        Goal: Provide education, support and care coordination for medication therapy and chronic conditions   This Visit's Progress: On track  Priority: High  Note:   Current Barriers:  Unable to independently afford treatment regimen Unable to achieve control of type 2 DM   Unable to maintain control of HTN  Pharmacist Clinical Goal(s):  Over the next 90 days, patient will verbalize ability to afford treatment regimen achieve control of type 2 DM as evidenced by A1c < 7.5% maintain control of hypertension and HDL as evidenced by blood pressure < 140/90 and LDL < 70  through collaboration with PharmD and provider.   Interventions: 1:1 collaboration with [name], MD regarding development and update of comprehensive plan of care as evidenced by provider attestation and co-signature Inter-disciplinary care team collaboration (see longitudinal plan of care) Comprehensive medication review performed; medication list updated in electronic medical record  Diabetes / obesity: Not at goal;. LDL goal < 7.5% Seeing Dr Cruzita Lederer Weight has decreased 10lbs since changed to [place].  Current treatment: Ozempic 1mg  - injection 1mg  subcutaneously once weekly Metfomrin XR 500mg  - take 4 tablets = 2000mg  daily with evening meal.  Glimepiride 1mg  daily prior to breakfast Tresiba - inject 14 units daily; may increase by 2 units every 2 to 4 days.  Previous medications: regular metformin - caused nausea and  loose stools (improved with change to XR), Victoza - abdominal pain Interventions:  Assessed for LIS / Extra help. Did not qualify. Application for patient assistance program for [place] and Ozempic returned and completed. Will forward to PCP for signature. Discussed A1c goals Reviewed home blood glucose readings and reviewed goals  Fasting blood glucose goal (before meals) = 80 to 130 Blood glucose goal after a meal = less than 180  Continue current medication regimen for diabetes.    Hypertension / CHF: Blood pressure Uncontrolled; blood pressure goal < 140/90 CHF controlled; Goal: minimize CHF symptoms and exacerbations  BP Readings from Last 3 Encounters:  11/09/21 (!) 159/98  11/05/21 (!) 130/92  10/15/21 (!) 162/90  Current treatment: Losartan 100mg  daily  Carvedilol 12.5mg  twice a day Furosemide 20mg  daily  Diltiazem CD 120mg  daily  Current home readings: not checking at home Patient states she gets nervous prior to appointments. Suggest she could take buspirone prior to appointment to see if they helped with anxiousness. Denies hypotensive/hypertensive symptoms Not checking weight at home. Office weight has decreased recently (due to initiation of Ozempic) Interventions:  Recommended using [place] over-the-counter benenfit to purchase blood pressure cuff.  Recommended check blood pressure once a day, record and provide at future appointments Discussed limiting intake of sodium to 2300mg  or less per day.  Discussed signs and symptoms of CHF exacerbation - weight gain, SOB, abdominal fullness, swelling in legs or abdomen, Fatigue and weakness, changes in ability to perform usual activities, persistent cough or wheezing with white  or pink blood-tinged mucus, nausea and lack of appetite Recommend check weight daily - report weight gain of more than 3 lbs in 24 hours or 5 lbs in 1 week.  Hyperlipidemia / CAD:  LDL at goal of < 70; triglycerides above goal of < 150 and  HDLC low - goal > 40 Lab Results  Component Value Date   CHOL 112 10/15/2021   HDL 31 (L) 10/15/2021   LDLCALC 52 10/15/2021   LDLDIRECT 56.0 07/16/2021   TRIG 169 (H) 10/15/2021   CHOLHDL 3.6 10/15/2021  Current treatment:  Rosuvastatin $RemoveBefor'40mg'ISXFApDnYTId$  daily  Fenofibrate $RemoveBefo'145mg'ZVBpnMXygQL$  daily  Ezetimibe $RemoveBe'10mg'NgdpVluOM$  daily  Icosapent Ethyl 1 gram - take 2 capsules twice a day (patient not taking due to cost) Interventions:  Discussed adherence Recommend physical activity daily to lower triglycerides and increase good cholesterol (HDLC). Goal is to work up to at least 150 minutes per week.  Weight loss and dietary changes of limiting high carbohydrate foods can also improve triglycerides and hyperlipidemia.  Anxiety:  Controlled but still gets anxious at medical office visits which causes blood pressure to increase Current treatment:  Buspirone 7.$RemoveBefo'5mg'ZyiAcYVphVX$  - take 1 tablet 3 times a day as needed  Escitalopram $RemoveBefor'20mg'DuEKBMzHvMFY$  - take 1 tablet each morning and 0.5 tablet each evening Interventions: Encouraged patient to try buspirone 3 times a day especially prior to medical visits. This might help to keep blood pressure from increasing during visits.  Continue to take escitalopram at current dose   Medication management Pharmacist Clinical Goal(s): Over the next 90 days, patient will work with PharmD and providers to maintain optimal medication adherence Current pharmacy: [facility] Comprehensive medication review performed. Reviewed refill history and assessed adherence Continue current medication management strategy Financial assessment performed . Patient is not taking Breo due to cost. Assessed for LIS / Extra help. Did not qualify. Breo patient assistance program requires $600 out of pocket spend. Will try for Symbicort patient assistance program instead. Completed patient assistance program application for Symbicort. Will forward to PCP for review and signature and then fax to program.  Patient  Goals/Self-Care Activities Over the next 90 days, patient will:  take medications as prescribed,  Check glucose daily, document, and provide at future appointments,  Check blood pressure daily, document, and provide at future appointments,  Collaborate with provider on medication access solutions - we are faxing completed applications for medication assistance program for Tresiba, Ozempic and Symbicort (will replace Breo) [place] a minimum of 150 minutes of moderate intensity exercise weekly Focus on medication adherence by filling medications appropriately  Take medications as prescribed Report any questions or concerns to PharmD and/or provider(s)  Follow Up Plan: Telephone follow up appointment with care management team member scheduled for:  2 to 3 weeks to check medication assistance program            Medication Assistance:  Applying for patient assistance program for Ozempic, Tresiba and Breo / symbicort.   Patient's preferred pharmacy is:  [facility], [place] - [name] [place] [address] Phone: [phone] Fax: [place], [place] [address] Phone: [phone] Fax: [phone]  Uses pill box? No -   Pt endorses 96% compliance  Follow Up:  Patient agrees to Care Plan and Follow-up.  Plan: Telephone follow up appointment with care management team member scheduled for:  2 to 3 weeks  Cherre Robins, PharmD Clinical Pharmacist [facility]

## 2021-11-13 NOTE — Telephone Encounter (Signed)
Pt called- wanting to speak with Tammy- informed that she is in with visits- she is asking for call back at your convenience. She may be reached at [phone].

## 2021-11-13 NOTE — Telephone Encounter (Signed)
See phone visit notes from 82/50/5397 - discussed applications with patient and completed. Forwarding to PCP for review and signature.

## 2021-11-13 NOTE — Patient Instructions (Signed)
Ms. Lizama It was a pleasure speaking with you  Below is a summary of your health goals and care plan  Patient Goals/Self-Care Activities  take medications as prescribed,  Check glucose daily, document, and provide at future appointments,  Check blood pressure daily, document, and provide at future appointments,  Collaborate with provider on medication access solutions - we are faxing completed applications for medication assistance program for Tresiba, Ozempic and Symbicort (will replace Breo) [employer] a minimum of 150 minutes of moderate intensity exercise weekly Focus on medication adherence by filling medications appropriately  Take medications as prescribed Report any questions or concerns to PharmD and/or provider(s)   If you have any questions or concerns, please feel free to contact me either at the phone number below or with a MyChart message.   Keep up the good work!  Cherre Robins, PharmD Clinical Pharmacist [place] [phone] (direct line)  [phone] (main office number)   Patient verbalizes understanding of instructions and care plan provided today and agrees to view in [place]. Active MyChart status and patient understanding of how to access instructions and care plan via MyChart confirmed with patient.

## 2021-11-14 ENCOUNTER — Encounter: Payer: Self-pay | Admitting: Internal Medicine

## 2021-11-20 ENCOUNTER — Other Ambulatory Visit: Payer: Self-pay | Admitting: Family Medicine

## 2021-11-20 ENCOUNTER — Telehealth: Payer: Medicare Other

## 2021-11-21 DIAGNOSIS — J45909 Unspecified asthma, uncomplicated: Secondary | ICD-10-CM | POA: Diagnosis not present

## 2021-11-21 DIAGNOSIS — I509 Heart failure, unspecified: Secondary | ICD-10-CM

## 2021-11-21 DIAGNOSIS — E785 Hyperlipidemia, unspecified: Secondary | ICD-10-CM | POA: Diagnosis not present

## 2021-11-21 DIAGNOSIS — I2510 Atherosclerotic heart disease of native coronary artery without angina pectoris: Secondary | ICD-10-CM | POA: Diagnosis not present

## 2021-11-21 DIAGNOSIS — I110 Hypertensive heart disease with heart failure: Secondary | ICD-10-CM | POA: Diagnosis not present

## 2021-11-21 DIAGNOSIS — F419 Anxiety disorder, unspecified: Secondary | ICD-10-CM

## 2021-11-21 DIAGNOSIS — Z7984 Long term (current) use of oral hypoglycemic drugs: Secondary | ICD-10-CM

## 2021-11-27 ENCOUNTER — Telehealth: Payer: Medicare Other

## 2021-11-30 ENCOUNTER — Ambulatory Visit (INDEPENDENT_AMBULATORY_CARE_PROVIDER_SITE_OTHER): Payer: Medicare Other | Admitting: Pharmacist

## 2021-11-30 DIAGNOSIS — I2510 Atherosclerotic heart disease of native coronary artery without angina pectoris: Secondary | ICD-10-CM

## 2021-11-30 DIAGNOSIS — G4733 Obstructive sleep apnea (adult) (pediatric): Secondary | ICD-10-CM | POA: Diagnosis not present

## 2021-11-30 DIAGNOSIS — E782 Mixed hyperlipidemia: Secondary | ICD-10-CM

## 2021-11-30 DIAGNOSIS — I10 Essential (primary) hypertension: Secondary | ICD-10-CM

## 2021-11-30 DIAGNOSIS — E1165 Type 2 diabetes mellitus with hyperglycemia: Secondary | ICD-10-CM

## 2021-11-30 NOTE — Chronic Care Management (AMB) (Signed)
Chronic Care Management Pharmacy Note  11/30/2021 Name:  [name] MRN:  [id number] DOB:  [birth year]  Summary: She is in Medicare Coverage gap, last Ozempic cost was $250.06 and last [name] was $109 (3 months supply). [name] was started 11/05/2021 due to blood glucose still in the 200's. She is taking [place] 16 units daily and blood glucose has improved.  Coordinated with medication assistance program for Joni Reining and Symbicort (will replace Breo). Patient has been approved 11/28/2021 to get Symbicort thru 06/23/2022 and was shipped 11/28/2021 to patient's home (Tracking # [id number] - mail innovations). Also spoke with representative with [employer]. Has to request re-evaluation of her application for [place] and Ozempic. Was approved when rep reviewed. Approved thru 05/23/2022 - our office should receive shipment in 10 to 14 business day.    Hyperlipidemia - HDLC and triglycerides not at goal. Patient reports she did not start generic Vascepa due to cost of >$100 (last triglycerides were 169 - down from 212). Should continue to see Tg improve with improved blood glucose.  Discussed benefits of weight loss, limiting intake of food high in carbohydrates and exercise in lowering TG and increasing HDLC. Recommend increase physical activity as able with goal of at least 150 minutes per week.    Plan: 2 to 3 weeks to see PCP and 3 months Clinical Pharmacist Practitioner   Subjective: [name] is an 71 y.o. year old female who is a primary patient of [name], MD.  The CCM team was consulted for assistance with disease management and care coordination needs.    Phone visit with patient and coordination with medication assistance program   for follow up visit in response to provider referral for pharmacy case management and/or care coordination services.   Consent to Services:  The patient was given information about Chronic Care Management  services, agreed to services, and gave verbal consent prior to initiation of services.  Please see initial visit note for detailed documentation.   Patient Care Team: [name], MD as PCP - General (Family Medicine) Sueanne Margarita, MD as PCP - Cardiology (Cardiology) Fanny Skates, MD as Consulting Physician (General Surgery) Nicholas Lose, MD as Consulting Physician (Hematology and Oncology) Gery Pray, MD as Consulting Physician (Radiation Oncology) [name], NP as Nurse Practitioner (Hematology and Oncology) Michiel Cowboy, RN as [place], [place], [place] (Pharmacist)  Recent office visits: 11/09/2021 - Int Med [facility]) Recheck HTN and diabetes. No medication changes made 07/16/2021 - Fam Med (Dr Charlett Blake) Seen for f/u chronic conditions and pain in left underarm. Labs checked. Referred to urogynogology for urinary incontinence. Labs showed Klebsiella UTI - prescribed cefdinir 344m bid for 5 days. Also recommended probiotic. Also low vitamin D - started 50,000IU weekly for 12 weeks and recommended to increase OTC vitamin D by 1000 IU daily.   Recent consult visits: 11/05/2021 - Endo (Dr GCruzita Lederer F/U DM. A1c was elelvated at 9.1%; FBG in morning still > 200. Continued metformin ER 2000 mg at dinnertime; Glimepiride 1 mg before breakfast (and you can use this before a larger meal later in the day, also) and Ozempic 1 mg weekly in a.m.  Started Tresiba U200 14 units daily and increase by 2-4 units every 4 days until sugars in am are <140 10/15/2021 - Cardio (Dr TRadford Pax F/U CAD, CHF, HTN and HDL. Repeat ECHO in March 2024. Added Lovaza 2 grams twice a day due to elelvated Tg and elevated  ALT 09/03/2021 - Endo (Dr Cruzita Lederer) F/U diabetes. Increased Ozempic to 52m weekly and changed meformin to ER - advised to take 20027mwiht evening meal. Continue glimepiride 47m32mefore breakfast. Continue Levothyroxine 250 mcg daily. Try to chew the  tablet. F/U 2 months.  [facility]20[facility] sinonasal symptoms. Continue nasal hygeive with isotonic nasal irrigations twice a day, continue CPAP. Resumed diclofenac and ASA for arthritis. F/U 6 months.   Hospital visits: None in previous 6 months  Objective:  Lab Results  Component Value Date   CREATININE 0.77 07/16/2021   CREATININE 0.85 03/20/2021   CREATININE 0.99 12/06/2020    Lab Results  Component Value Date   HGBA1C 9.1 (A) 11/05/2021   Last diabetic Eye exam:  Lab Results  Component Value Date/Time   HMDIABEYEEXA No Retinopathy 10/24/2021 12:00 AM    Last diabetic Foot exam: No results found for: "HMDIABFOOTEX"      Component Value Date/Time   CHOL 112 10/15/2021 0822   TRIG 169 (H) 10/15/2021 0822   HDL 31 (L) 10/15/2021 0822   CHOLHDL 3.6 10/15/2021 0822   CHOLHDL 3 07/16/2021 1213   VLDL 42.4 (H) 07/16/2021 1213   LDLCALC 52 10/15/2021 0822   LDLCALC 67 06/02/2020 1427   LDLDIRECT 56.0 07/16/2021 1213       Latest Ref Rng & Units 10/15/2021    8:22 AM 07/16/2021   12:13 PM 03/20/2021   10:55 AM  Hepatic Function  Total Protein 6.0 - 8.3 g/dL  7.6  7.3   Albumin 3.5 - 5.2 g/dL  4.2  4.0   AST 0 - 37 U/L  50  58   ALT 0 - 32 IU/L 35  32  46   Alk Phosphatase 39 - 117 U/L  77  78   Total Bilirubin 0.2 - 1.2 mg/dL  0.5  0.4     Lab Results  Component Value Date/Time   TSH 5.18 08/31/2021 10:27 AM   TSH 8.97 (H) 07/16/2021 12:13 PM   FREET4 1.33 03/26/2018 10:24 AM       Latest Ref Rng & Units 07/16/2021   12:13 PM 12/06/2020   11:04 AM 09/05/2020    8:46 AM  CBC  WBC 4.0 - 10.5 K/uL 7.2  8.6  9.4   Hemoglobin 12.0 - 15.0 g/dL 10.6  11.2  11.1   Hematocrit 36.0 - 46.0 % 34.9  36.6  35.4   Platelets 150.0 - 400.0 K/uL 265.0  298.0  288.0     Lab Results  Component Value Date/Time   VD25OH 13.17 (L) 07/16/2021 12:13 PM   VD25OH 17.38 (L) 12/06/2020 11:04 AM    Clinical ASCVD: Yes  The ASCVD  Risk score (Arnett DK, et al., 2019) failed to calculate for the following reasons:   The valid total cholesterol range is 130 to 320 mg/dL    Other: (CHADS2VASc if Afib, PHQ9 if depression, MMRC or CAT for COPD, ACT, DEXA)  Social History   Tobacco Use  Smoking Status Never  Smokeless Tobacco Never   BP Readings from Last 3 Encounters:  11/09/21 (!) 159/98  11/05/21 (!) 130/92  10/15/21 (!) 162/90   Pulse Readings from Last 3 Encounters:  11/09/21 84  11/05/21 81  10/15/21 78   Wt Readings from Last 3 Encounters:  11/09/21 (!) 308 lb 9.6 oz (140 kg)  11/05/21 (!) 312 lb 3.2 oz (141.6 kg)  10/15/21 (!) 314 lb 9.6 oz (142.7 kg)  Assessment: Review of patient past medical history, allergies, medications, health status, including review of consultants reports, laboratory and other test data, was performed as part of comprehensive evaluation and provision of chronic care management services.   SDOH:  (Social Determinants of Health) assessments and interventions performed:     CCM Care Plan  No Known Allergies  Medications Reviewed Today     Reviewed by Cherre Robins, RPH-CPP (Pharmacist) on 11/30/21 at 40  Med List Status: <None>   Medication Order Taking? Sig Documenting Provider Last Dose Status Informant  acetaminophen (TYLENOL) 500 MG tablet [id number] Yes Take 1,000 mg by mouth 2 (two) times daily as needed for moderate pain or headache. [provider] Taking Active Self  albuterol (VENTOLIN HFA) 108 (90 Base) MCG/ACT inhaler [id number] Yes Inhale 2 puffs every 4-6 hours as needed - rescue. Deneise Lever, MD Taking Active   anastrozole (ARIMIDEX) 1 MG tablet [id number] Yes Take 1 tablet (1 mg total) by mouth daily. Nicholas Lose, MD Taking Active   aspirin EC 81 MG tablet [id number] Yes Take 81 mg by mouth daily. Swallow whole. [provider] Taking Active   blood glucose meter kit and supplies [id number] Yes Dispense based on patient and insurance  preference. Use up to four times daily as directed. (FOR ICD-10 E10.9, E11.9). [name], MD Taking Active   budesonide-formoterol [facility]) 160-4.5 MCG/ACT inhaler [id number] Yes Inhale 2 puffs into the lungs 2 (two) times daily. [provider]  Active            Med Note Antony Contras, [place] B   Fri Nov 30, 2021  4:14 PM) Approved to receive from [facility] and me program thru 06/23/2022  busPIRone (BUSPAR) 7.5 MG tablet [id number] Yes Take 1 tablet (7.5 mg total) by mouth 3 (three) times daily. As needed for anxiety Debbrah Alar, NP Taking Active   carvedilol (COREG) 25 MG tablet [id number] Yes Take 1 tablet (25 mg total) by mouth 2 (two) times daily with a meal. Debbrah Alar, NP Taking Active   diclofenac (VOLTAREN) 75 MG EC tablet [id number] Yes Take 1 tablet (75 mg total) by mouth 2 (two) times daily. Debbrah Alar, NP Taking Active   diltiazem (CARDIZEM CD) 120 MG 24 hr capsule [id number] Yes Take 1 capsule (120 mg total) by mouth daily. Debbrah Alar, NP Taking Active   Docusate Calcium (STOOL SOFTENER PO) [id number] Yes Take by mouth daily as needed. [provider] Taking Active   escitalopram (LEXAPRO) 20 MG tablet [id number] Yes TAKE 1 TABLET EVERY MORNING AND 1/2 EVERY EVENING Debbrah Alar, NP Taking Active   esomeprazole (NEXIUM) 40 MG capsule [id number] Yes Take 1 capsule (40 mg total) by mouth daily. [name], MD Taking Active   ezetimibe (ZETIA) 10 MG tablet [id number] Yes Take 1 tablet (10 mg total) by mouth daily. [name], MD Taking Active   fenofibrate (TRICOR) 145 MG tablet [id number] Yes TAKE ONE TABLET ONCE DAILY [name], MD Taking Active   fluticasone (FLONASE) 50 MCG/ACT nasal spray [id number] Yes Place 2 sprays into both nostrils daily. [name], MD Taking Active   furosemide (LASIX) 20 MG tablet [id number] Yes TAKE ONE TABLET ONCE DAILY [name], MD Taking Active   glimepiride (AMARYL) 1 MG tablet  [id number] Yes Take 1 tablet (1 mg total) by mouth daily before breakfast. Debbrah Alar, NP Taking Active   glucose blood (ACCU-CHEK AVIVA) test strip [id number] Yes Twice daily (please use brand that patient insurance covers)  [name], MD Taking Active   Hypromellose (ARTIFICIAL TEARS OP) [id number] Yes Place 1 drop into both eyes daily as needed (dry eyes).  [provider] Taking Active Self  icosapent Ethyl (VASCEPA) 1 g capsule [id number] Yes Take 2 capsules (2 g total) by mouth 2 (two) times daily. Sueanne Margarita, MD Taking Active   insulin degludec (TRESIBA FLEXTOUCH) 200 UNIT/ML FlexTouch Pen [id number] Yes Inject 20 Units into the skin daily.  Patient taking differently: Inject 14 Units into the skin daily.   Philemon Kingdom, MD Taking Active            Med Note Antony Contras, [place] B   Fri Nov 30, 2021  4:15 PM) Approved to received from [employer] 11/30/21 thru 05/23/2022  Insulin Pen Needle 32G X 4 MM MISC [id number] Yes Use 1x a day Philemon Kingdom, MD Taking Active   levothyroxine (SYNTHROID) 200 MCG tablet [id number] Yes Take 1 tablet (200 mcg total) by mouth daily. Debbrah Alar, NP Taking Active   levothyroxine (SYNTHROID) 50 MCG tablet [id number] Yes Take 1 tablet (50 mcg total) by mouth daily. Colon Branch, MD Taking Active   loratadine (CLARITIN) 10 MG tablet [id number] Yes Take 1 tablet (10 mg total) by mouth daily. [name], MD Taking Active   losartan (COZAAR) 100 MG tablet [id number] Yes Take 1 tablet (100 mg total) by mouth daily. Debbrah Alar, NP Taking Active   meclizine (ANTIVERT) 25 MG tablet [id number] Yes Take 25 mg by mouth every 6 (six) hours as needed. [provider] Taking Active   metFORMIN (GLUCOPHAGE-XR) 500 MG 24 hr tablet [id number] Yes Take 4 tablets (2,000 mg total) by mouth daily with supper. Philemon Kingdom, MD Taking Active   montelukast (SINGULAIR) 10 MG tablet [id number] Yes TAKE ONE TABLET AT BEDTIME [name], MD Taking Active   rosuvastatin (CRESTOR) 40 MG tablet [id number] Yes Take 1 tablet (40 mg total) by mouth daily. Debbrah Alar, NP Taking Active   Semaglutide, 1 MG/DOSE, (OZEMPIC, 1 MG/DOSE,) 4 MG/3ML SOPN [id number] Yes Inject 1 mg into the skin once a week. Philemon Kingdom, MD Taking Active            Med Note [facility], [place] B   Fri Nov 30, 2021  4:15 PM) Approved to received from [employer] 11/30/21 thru 05/23/2022  Vitamin D, Ergocalciferol, (DRISDOL) 1.25 MG (50000 UNIT) CAPS capsule [id number] Yes Take 1 capsule (50,000 Units total) by mouth every 7 (seven) days. [name], MD Taking Active             Patient Active Problem List   Diagnosis Date Noted   Allergic rhinitis 11/09/2021   Hx of papillary thyroid carcinoma 07/19/2021   Poorly controlled type 2 diabetes mellitus with circulatory disorder ([place]) 07/19/2021   Atypical chest pain 07/16/2021   Urinary incontinence 07/16/2021   Hypothyroidism 03/20/2021   Nausea 12/06/2020   Muscle cramps 12/06/2020   H/O thyroidectomy 08/30/2020   CSF leak from nose 01/13/2020   Uncontrolled type 2 diabetes mellitus with hyperglycemia ([place]) 04/14/2018   Vitamin D deficiency 03/30/2018   Dental infection 03/29/2018   Dysphagia 12/28/2017   Cough 12/28/2017   Peripheral neuropathy 09/22/2017   Lipoma 09/22/2017   Lymphedema 05/22/2017   Asthmatic bronchitis, mild intermittent, uncomplicated 45/80/9983   Breast cancer ([place]) 02/17/2017   Dysuria 02/17/2017   Insomnia 10/08/2016   Dilated aortic root ([place])    Port catheter in place 08/01/2016   Malignant neoplasm of upper-outer  quadrant of left female breast ([place]) 05/14/2016   PVC's (premature ventricular contractions) 05/02/2016   Neck mass 04/21/2016   LVH (left ventricular hypertrophy) due to hypertensive disease, with heart failure ([place]) 04/18/2016   Tonsillar hypertrophy 07/07/2015   Back pain 10/02/2014   Obesity 07/11/2014   S/P right TKA 05/10/2014   S/P  knee replacement 05/10/2014   Atrial tachycardia, paroxysmal ([place]) 02/22/2014   Type 2 diabetes mellitus with hyperglycemia ([facility]) 12/31/2013   Chronic diastolic CHF (congestive heart failure) ([place]) 11/22/2013   CAD (coronary artery disease), native coronary artery 11/22/2013   Dyspnea on exertion 11/03/2013   Undiagnosed cardiac murmurs 09/05/2013   Preventative health care 09/05/2013   Musculoskeletal pain 11/10/2012   Vasomotor rhinitis 04/05/2012   Obstructive sleep apnea 04/05/2012   Anemia 04/05/2012   Elevated LFTs 04/05/2012   OA (osteoarthritis) of knee 04/05/2012   Hernia 10/13/2010   Hyperlipidemia 09/11/2007   Anxiety and depression 09/11/2007   Essential hypertension 09/11/2007   GERD 09/11/2007   RENAL CALCULUS 09/11/2007   RENAL CYST 09/11/2007   COLONIC POLYPS, HX OF 09/11/2007    Immunization History  Administered Date(s) Administered   Fluad Quad(high Dose 65+) 04/21/2019, 03/20/2021   Influenza Split 03/24/2012, 03/24/2013   Influenza, High Dose Seasonal PF 04/18/2016, 04/21/2017, 03/24/2018, 04/27/2020   Influenza-Unspecified 03/21/2014, 03/15/2015, 03/18/2015   Moderna Covid-19 Vaccine Bivalent Booster 70yr & up 05/07/2021   Moderna SARS-COV2 Booster Vaccination 12/06/2020   Moderna Sars-Covid-2 Vaccination 07/29/2019, 08/27/2019, 04/19/2020   Pneumococcal Conjugate-13 04/18/2016   Pneumococcal Polysaccharide-23 05/22/2017   Tdap 10/02/2021   Zoster Recombinat (Shingrix) 04/21/2019   Zoster, Live 04/19/2011    Conditions to be addressed/monitored: CHF, CAD, HTN, HLD, DMII, Anxiety, Depression, Hypothyroidism, and history of breast cancer, history of papillary thyroid cancer with post surgical hypothyroidism; obesity  Care Plan : General Pharmacy (Adult)  Updates made by ECherre Robins RPH-CPP since 11/30/2021 12:00 AM     Problem: Chronic Conditions: type 2 DM; HTN; CHF; CAD; allergies; h/o breast cancer; HDL; post surgical hypothyroidism   Priority:  High  Onset Date: 09/17/2021  Note:   Current Barriers:  Unable to independently afford treatment regimen Unable to achieve control of hypertension and type 2 DM   Pharmacist Clinical Goal(s):  Over the next 90 days, patient will verbalize ability to afford treatment regimen achieve control of type 2 DM and HTN as evidenced by meeting goals listed below adhere to prescribed medication regimen as evidenced by refill history  through collaboration with PharmD and provider.   Interventions: 1:1 collaboration with BMosie Lukes MD regarding development and update of comprehensive plan of care as evidenced by provider attestation and co-signature Inter-disciplinary care team collaboration (see longitudinal plan of care) Comprehensive medication review performed; medication list updated in electronic medical record  Diabetes: Not at goal but improving; A1c goal < 7.5% Monitored by Dr GCruzita LedererCurrent treatment: Metformin ER 5033m- take 4 tablets = 200081mith evening meal Glipizide 1mg35mtake 1 talblet prior to breakfast Ozempic - inject 1mg 88mcutaneously once a week.   Current glucose readings: fasting glucose: 100 to 140's; post prandial glucose: 120 to 200 Denies  hypoglycemic/hyperglycemic symptoms Current exercise: 10 minutes 3 or 4 days per week - chair and arm exercises or walks Initial weight prior to Ozempic = 321lbs  Interventions:  Educated on blood glucose goals Counseled on proper administration of medications Assessed patient finances. Plan to apply for LIS / Medicare Extra Help; if denied then will apply for patient  assistance program for Ozempic.    Hypertension:  Uncontrolled/controlled current treatment:     Current home readings:   Denies/reports hypotensive/hypertensive symptoms Interventions:    Hyperlipidemia:  Uncontrolled/controlled; current treatment: ;   Medications previously tried:     Current dietary patterns:   Interventions:    Asthma:  Uncontrolled/controlled; current treatment: ;  ACT score:  Interventions:   Low Vitamin D  Medication management Pharmacist Clinical Goal(s): Over the next 90 days, patient will work with PharmD and providers to maintain optimal medication adherence Current pharmacy: [facility] Interventions Comprehensive medication review performed. Reviewed refill history and assessed adherence Continue current medication management strategy Patient self care activities - Over the next 90 days, patient will: Focus on medication adherence by filling medications appropriately  Take medications as prescribed Report any questions or concerns to PharmD and/or provider(s)  Patient Goals/Self-Care Activities Over the next 90 days, patient will:  take medications as prescribed, check glucose daily ar varying times of day, document, and provide at future appointments, and collaborate with provider on medication access solutions  Follow Up Plan: Telephone follow up appointment with care management team member scheduled for:  09/24/2021 - to apply for LIS        Goal: Provide education, support and care coordination for medication therapy and chronic conditions   Recent Progress: On track  Priority: High  Note:   Current Barriers:  Unable to independently afford treatment regimen Unable to achieve control of type 2 DM   Unable to maintain control of HTN  Pharmacist Clinical Goal(s):  Over the next 90 days, patient will verbalize ability to afford treatment regimen achieve control of type 2 DM as evidenced by A1c < 7.5% maintain control of hypertension and HDL as evidenced by blood pressure < 140/90 and LDL < 70  through collaboration with PharmD and provider.   Interventions: 1:1 collaboration with [name], MD regarding development and update of comprehensive plan of care as evidenced by provider attestation and co-signature Inter-disciplinary care team collaboration (see  longitudinal plan of care) Comprehensive medication review performed; medication list updated in electronic medical record  Diabetes / obesity: Not at goal;. LDL goal < 7.5% Seeing Dr Cruzita Lederer Weight has decreased 10lbs since changed to [place].  Current treatment: Ozempic 71m - injection 166msubcutaneously once weekly Metfomrin XR 50059m take 4 tablets = 2000m78mily with evening meal.  Glimepiride 1mg 72mly prior to breakfast Tresiba - inject 14 units daily; may increase by 2 units every 2 to 4 days.  Previous medications: regular metformin - caused nausea and loose stools (improved with change to XR), Victoza - abdominal pain Interventions:  Screened for LIS / Extra help. Did not qualify (addressed at previous visit)  Coordinated with [employer] assistance program - requested reevaluation of application faxed [phone] today 11/30/2021 thru 05/23/2022 for [place]. Expect first delivery in 10 to 14 business days.  Discussed A1c goals Reviewed home blood glucose readings and reviewed goals  Fasting blood glucose goal (before meals) = 80 to 130 Blood glucose goal after a meal = less than 180  Continue current medication regimen for diabetes.    Hypertension / CHF: Blood pressure Uncontrolled; blood pressure goal < 140/90 CHF controlled; Goal: minimize CHF symptoms and exacerbations  BP Readings from Last 3 Encounters:  11/09/21 (!) 159/98  11/05/21 (!) 130/92  10/15/21 (!) 162/90  Current treatment: Losartan 100mg 16my  Carvedilol 12.5mg tw75m a day Furosemide 20mg da73m Diltiazem CD  159m daily  Current home readings: not checking at home Patient states she gets nervous prior to appointments. Suggest she could take buspirone prior to appointment to see if they helped with anxiousness. Denies hypotensive/hypertensive symptoms Not checking weight at home. Office weight has decreased recently (due to initiation of Ozempic) Interventions:   Recommended using [place] [place] to purchase blood pressure cuff.  Recommended check blood pressure once a day, record and provide at future appointments Discussed limiting intake of sodium to 23080mor less per day.  Discussed signs and symptoms of CHF exacerbation - weight gain, SOB, abdominal fullness, swelling in legs or abdomen, Fatigue and weakness, changes in ability to perform usual activities, persistent cough or wheezing with white or pink blood-tinged mucus, nausea and lack of appetite Recommend check weight daily - report weight gain of more than 3 lbs in 24 hours or 5 lbs in 1 week.  Hyperlipidemia / CAD:  LDL at goal of < 70; triglycerides above goal of < 150 and HDLC low - goal > 40 Lab Results  Component Value Date   CHOL 112 10/15/2021   HDL 31 (L) 10/15/2021   LDLCALC 52 10/15/2021   LDLDIRECT 56.0 07/16/2021   TRIG 169 (H) 10/15/2021   CHOLHDL 3.6 10/15/2021  Current treatment:  Rosuvastatin 4026maily  Fenofibrate 145m65mily  Ezetimibe 10mg22mly  Icosapent Ethyl 1 gram - take 2 capsules twice a day (patient not taking due to cost) Interventions:  Discussed adherence Recommend physical activity daily to lower triglycerides and increase good cholesterol (HDLC). Goal is to work up to at least 150 minutes per week.  Weight loss and dietary changes of limiting high carbohydrate foods can also improve triglycerides and hyperlipidemia.  Anxiety:  Controlled but still gets anxious at medical office visits which causes blood pressure to increase Current treatment:  Buspirone 7.5mg -61mke 1 tablet 3 times a day as needed  Escitalopram 20mg -80me 1 tablet each morning and 0.5 tablet each evening Interventions: Encouraged patient to try buspirone 3 times a day especially prior to medical visits. This might help to keep blood pressure from increasing during visits.  Continue to take escitalopram at current dose   Medication  management Pharmacist Clinical Goal(s): Over the next 90 days, patient will work with PharmD and providers to maintain optimal medication adherence [facility] Comprehensive medication review performed. Reviewed refill history and assessed adherence Continue current medication management strategy Financial assessment performed . Patient is not taking Breo due to cost. Assessed for LIS / Extra help. Did not qualify. Breo patient assistance program requires $600 out of pocket spend. Applied for Symbicort patient assistance program instead. Approved 11/28/2021 thru 06/23/2022 and first shipment was sent 11/28/2021. Patient aware and discussed differenced between Breo [facility] inhalers.   Patient Goals/Self-Care Activities Over the next 90 days, patient will:  take medications as prescribed,  Check glucose daily, document, and provide at future appointments,  Check blood pressure daily, document, and provide at future appointments,  Collaborate with provider on medication access solutions - you have been approved to received Symbicort (replaces Breo), TresibaTyler Aasempic from medication assistance program. Symbicort will be shipped to you home and [place]iba[place]pic will be shipped to our office.  Target a minimum of 150 minutes of moderate intensity exercise weekly Focus on medication adherence by filling medications appropriately  Take medications as prescribed Report any questions or concerns to PharmD and/or provider(s)  Follow Up Plan: Telephone  follow up appointment with care management team member scheduled for:   3 months; 2 to 3 weeks follow up with PCP            Medication Assistance:  [place] and Ozempic obtained through [employer]  medication assistance program.  Enrollment ends 05/23/2022;  Symbicort obtained through [place] [facility] [place]  medication assistance program.  Enrollment ends 06/23/2022  Patient's preferred pharmacy is:  [facility], [place] - [name] [place] [address] Phone: [phone] Fax: [place], [place] [address] [place] [address] Phone: [phone] Fax: [phone]  Uses pill box? No -   Pt endorses 96% compliance  Follow Up:  Patient agrees to Care Plan and Follow-up.  Plan: Telephone follow up appointment with care management team member scheduled for:  2 to 3 months  Cherre Robins, PharmD Clinical Pharmacist [facility]

## 2021-11-30 NOTE — Patient Instructions (Signed)
[name] It was a pleasure speaking with you  Below is a summary of your health goals and care plan  Patient Goals/Self-Care Activities take medications as prescribed,  Check glucose daily, document, and provide at future appointments,  Check blood pressure daily, document, and provide at future appointments,  Collaborate with provider on medication access solutions. You were approved for the medication assistance program for Tresiba, Ozempic and Symbicort (will replace Breo).  Symbicort will be delivered to your home and [name] and Ozempic will be delivered to our office.  Target a minimum of 150 minutes of moderate intensity exercise weekly Focus on medication adherence by filling medications appropriately  Take medications as prescribed Report any questions or concerns to PharmD and/or provider(s)    Follow Up Plan: Telephone follow up appointment with care management team member scheduled for:  3 months; 2 to 3 weeks PCP     If you have any questions or concerns, please feel free to contact me either at the phone number below or with a MyChart message.   Keep up the good work!  Cherre Robins, PharmD Clinical Pharmacist [facility] [place] [facility] [phone] (direct line)  [phone] (main office number)   Patient verbalizes understanding of instructions and care plan provided today and agrees to view in [place]. Active MyChart status and patient understanding of how to access instructions and care plan via MyChart confirmed with patient.

## 2021-12-11 ENCOUNTER — Ambulatory Visit (INDEPENDENT_AMBULATORY_CARE_PROVIDER_SITE_OTHER): Payer: Medicare Other | Admitting: Family

## 2021-12-11 DIAGNOSIS — I10 Essential (primary) hypertension: Secondary | ICD-10-CM

## 2021-12-11 MED ORDER — VALSARTAN 320 MG PO TABS: 320.0000 mg | ORAL_TABLET | Freq: Every day | ORAL | 3 refills | Status: DC

## 2021-12-11 NOTE — Patient Instructions (Signed)
Stop Losartan, Start valsartan '360mg'$  once daily.

## 2021-12-11 NOTE — Assessment & Plan Note (Addendum)
BP Readings from Last 3 Encounters:  12/11/21 (!) 154/70  11/09/21 (!) 159/98  11/05/21 (!) 130/92   Uncontrolled despite increase in carvedilol. Will d/c losartan and begin valsartan '360mg'$  once daily. Continue diltiazem.

## 2021-12-11 NOTE — Progress Notes (Signed)
Subjective:   By signing my name below, I, Carylon Perches, attest that this documentation has been prepared under the direction and in the presence of Debbrah Alar NP, 12/11/2021   [name], female    DOB: 1951-03-20, 71 y.o.   MRN: [id number]  Chief Complaint  Patient presents with   Hypertension    Here for follow up    HPI Patient is in today for an office visit  Blood Pressure: As of today's visit, her blood pressure is not improving. She has mild white coat syndrome. She is currently taking 25 Mg of Carvedilol twice a day and 100 Mg of Losartan. BP Readings from Last 3 Encounters:  12/11/21 (!) 154/70  11/09/21 (!) 159/98  11/05/21 (!) 130/92   Pulse Readings from Last 3 Encounters:  12/11/21 67  11/09/21 84  11/05/21 81   Metformin XR: She was prescribed 500 Mg of Metformin XR and noticed that she wasn't prescribed the extended release at the pharmacy. She reports that she is scheduled to see Dr. Cruzita Lederer on 04/12/2022.  Health Maintenance Due  Topic Date Due   Zoster Vaccines- Shingrix (2 of 2) 06/16/2019   FOOT EXAM  06/02/2021    Past Medical History:  Diagnosis Date   Anemia 04/05/2012   Anxiety    Anxiety state 09/11/2007   Qualifier: Diagnosis of  By: Sherren Mocha RN, Dorian Pod     Asthma    Atrial tachycardia, paroxysmal ([place])    Back pain 10/02/2014   Breast cancer ([place]) 02/17/2017   CAD (coronary artery disease), native coronary artery    remote Cath with nonobstructive ASCAD with 20% mid LAD, 20% prox left circ and 20% ostial RCA   Chicken pox as a child   Chronic diastolic CHF (congestive heart failure), NYHA class 1 ([place])    Complication of anesthesia    pt states feels different in her body after anesthesia when waking up and also experiences a smell of burnt plastic for approx a wk    Constipation    Depression    Diabetes ([place])    Dilated aortic root ([place])    47m by echo 08/2020   Dysuria 02/17/2017   Elevated LFTs 04/05/2012    GERD (gastroesophageal reflux disease)    Goiter    Heart murmur    hx of one at birth    History of kidney stones    History of radiation therapy 10/31/16-12/17/16   left breast 45 Gy in 25 fractions, left breast boost 16 Gy in 8 fractions   Hx of colonic polyps    Hyperlipidemia    Hypertension    Insomnia 10/08/2016   Joint pain    Malignant neoplasm of upper-outer quadrant of left female breast ([place] 05/14/2016   not with patient   Measles as a child   Mumps as a child   OA (osteoarthritis) of knee 04/05/2012   Obesity 07/11/2014   PONV (postoperative nausea and vomiting)    Preventative health care 09/05/2013   PVC's (premature ventricular contractions) 05/02/2016   Shortness of breath dyspnea    walking distances / climbing stairs   Sleep apnea 04/05/2012   Tinea corporis 02/23/2013   Type 2 diabetes mellitus with hyperglycemia ([place] 12/31/2013   Vasomotor rhinitis 04/05/2012   Ventral hernia    Wheezing     Past Surgical History:  Procedure Laterality Date   BREAST LUMPECTOMY Left 2017   BREAST LUMPECTOMY WITH RADIOACTIVE SEED AND SENTINEL LYMPH NODE BIOPSY Left  06/21/2016   Procedure: LEFT BREAST LUMPECTOMY WITH RADIOACTIVE SEED AND SENTINEL LYMPH NODE BIOPSY, INJECT BLUE DYE LEFT BREAST;  Surgeon: Fanny Skates, MD;  Location: [facility];  Service: General;  Laterality: Left;   CARDIAC CATHETERIZATION     normal coroary arteries per patient   CARDIOVASCULAR STRESS TEST     10/12/2013   CESAREAN SECTION     X 3   CHOLECYSTECTOMY     COLONOSCOPY WITH PROPOFOL N/A 03/28/2015   Procedure: COLONOSCOPY WITH PROPOFOL;  Surgeon: Juanita Craver, MD;  Location: [facility];  Service: Endoscopy;  Laterality: N/A;   HERNIA REPAIR  02/08/11   ventral hernia   JOINT REPLACEMENT     bilateral   KNEE ARTHROSCOPY  05/2010   bilateral   LEFT AND RIGHT HEART CATHETERIZATION WITH CORONARY ANGIOGRAM N/A 11/08/2013   Procedure: LEFT AND RIGHT HEART CATHETERIZATION WITH CORONARY ANGIOGRAM;   Surgeon: [name], MD;  [facility] C[facility];  Service: Cardiovascular;  Laterality: N/A;   MENISCUS REPAIR  2009   MOUTH SURGERY     teeth implants   PORT-A-CATH REMOVAL N/A 01/24/2017   Procedure: REMOVAL PORT-A-CATH;  Surgeon: Fanny Skates, MD;  Location: [facility];  Service: General;  Laterality: N/A;   PORTACATH PLACEMENT Right 07/16/2016   Procedure: INSERTION PORT-A-CATH RIGHT INTERNAL JUGULAR WITH ULTRASOUND;  Surgeon: Fanny Skates, MD;  Location: [facility];  Service: General;  Laterality: Right;   REPAIR DURAL / CSF LEAK  2021   performed at [facility] in [place]  2011   left   TOTAL KNEE ARTHROPLASTY Right 05/10/2014   Procedure: RIGHT TOTAL KNEE ARTHROPLASTY;  Surgeon: Mauri Pole, MD;  Location: [facility];  Service: Orthopedics;  Laterality: Right;   TUBAL LIGATION     WISDOM TOOTH EXTRACTION  2000    Family History  Problem Relation Age of Onset   Thyroid disease Mother    Hyperlipidemia Mother    Depression Mother    Anxiety disorder Mother    Heart attack Father 51   Hypertension Father    Arthritis Father        RA   Coronary artery disease Father    High Cholesterol Father    Coronary artery disease Brother    Heart disease Brother    Cancer Maternal Aunt        colon   Cancer Maternal Grandmother        colon   Heart attack Maternal Grandfather    Alcohol abuse Paternal Grandfather     Social History   Socioeconomic History   Marital status: Divorced    Spouse name: Not on file   Number of children: 3   Years of education: Not on file   Highest education level: Not on file  Occupational History   Occupation: retired - [employer]  Tobacco Use   Smoking status: Never   Smokeless tobacco: Never  Vaping Use   Vaping Use: Never used  Substance and Sexual Activity   Alcohol use: Not Currently    Comment: rarely   Drug use: No   Sexual activity: Never  Other Topics Concern   Not on file  Social  History Narrative   Not on file   Social Determinants of Health   Financial Resource Strain: Low Risk  (08/13/2021)   Overall Financial Resource Strain (CARDIA)    Difficulty of Paying Living Expenses: Not hard at all  Food Insecurity: No Food Insecurity (08/10/2021)  Hunger Vital Sign    Worried About Running Out of Food in the Last Year: Never true    Ran Out of Food in the Last Year: Never true  Transportation Needs: No Transportation Needs (08/10/2021)   PRAPARE - Hydrologist (Medical): No    Lack of Transportation (Non-Medical): No  Physical Activity: Insufficiently Active (09/21/2021)   Exercise Vital Sign    Days of Exercise per Week: 4 days    Minutes of Exercise per Session: 10 min  Stress: No Stress Concern Present (08/13/2021)   [place]    Feeling of Stress : Not at all  Social Connections: Moderately Isolated (08/13/2021)   Social Connection and Isolation Panel [NHANES]    Frequency of Communication with Friends and Family: More than three times a week    Frequency of Social Gatherings with Friends and Family: More than three times a week    Attends Religious Services: More than 4 times per year    Active Member of [employer] or Organizations: No    Attends Archivist Meetings: Never    Marital Status: Divorced  Human resources officer Violence: Not At Risk (08/13/2021)   Humiliation, Afraid, Rape, and Kick questionnaire    Fear of Current or Ex-Partner: No    Emotionally Abused: No    Physically Abused: No    Sexually Abused: No    Outpatient Medications Prior to Visit  Medication Sig Dispense Refill   acetaminophen (TYLENOL) 500 MG tablet Take 1,000 mg by mouth 2 (two) times daily as needed for moderate pain or headache.     albuterol (VENTOLIN HFA) 108 (90 Base) MCG/ACT inhaler Inhale 2 puffs every 4-6 hours as needed - rescue. 18 g 12   anastrozole (ARIMIDEX) 1 MG  tablet Take 1 tablet (1 mg total) by mouth daily. 90 tablet 3   aspirin EC 81 MG tablet Take 81 mg by mouth daily. Swallow whole.     blood glucose meter kit and supplies Dispense based on patient and insurance preference. Use up to four times daily as directed. (FOR ICD-10 E10.9, E11.9). 1 each 0   budesonide-formoterol (SYMBICORT) 160-4.5 MCG/ACT inhaler Inhale 2 puffs into the lungs 2 (two) times daily.     busPIRone (BUSPAR) 7.5 MG tablet Take 1 tablet (7.5 mg total) by mouth 3 (three) times daily. As needed for anxiety 90 tablet 5   carvedilol (COREG) 25 MG tablet Take 1 tablet (25 mg total) by mouth 2 (two) times daily with a meal. 60 tablet 3   diclofenac (VOLTAREN) 75 MG EC tablet Take 1 tablet (75 mg total) by mouth 2 (two) times daily. 180 tablet 0   diltiazem (CARDIZEM CD) 120 MG 24 hr capsule Take 1 capsule (120 mg total) by mouth daily. 90 capsule 1   Docusate Calcium (STOOL SOFTENER PO) Take by mouth daily as needed.     escitalopram (LEXAPRO) 20 MG tablet TAKE 1 TABLET EVERY MORNING AND 1/2 EVERY EVENING 135 tablet 1   esomeprazole (NEXIUM) 40 MG capsule Take 1 capsule (40 mg total) by mouth daily. 90 capsule 3   ezetimibe (ZETIA) 10 MG tablet Take 1 tablet (10 mg total) by mouth daily. 90 tablet 3   fenofibrate (TRICOR) 145 MG tablet TAKE ONE TABLET ONCE DAILY 90 tablet 1   fluticasone (FLONASE) 50 MCG/ACT nasal spray Place 2 sprays into both nostrils daily. 16 g 6   furosemide (LASIX) 20 MG tablet  TAKE ONE TABLET ONCE DAILY 90 tablet 0   glimepiride (AMARYL) 1 MG tablet Take 1 tablet (1 mg total) by mouth daily before breakfast. 90 tablet 1   glucose blood (ACCU-CHEK AVIVA) test strip Twice daily (please use brand that patient insurance covers) 100 each 5   Hypromellose (ARTIFICIAL TEARS OP) Place 1 drop into both eyes daily as needed (dry eyes).      icosapent Ethyl (VASCEPA) 1 g capsule Take 2 capsules (2 g total) by mouth 2 (two) times daily. 360 capsule 3   insulin degludec  (TRESIBA FLEXTOUCH) 200 UNIT/ML FlexTouch Pen Inject 20 Units into the skin daily. (Patient taking differently: Inject 14 Units into the skin daily.) 9 mL 3   Insulin Pen Needle 32G X 4 MM MISC Use 1x a day 100 each 3   levothyroxine (SYNTHROID) 200 MCG tablet Take 1 tablet (200 mcg total) by mouth daily. 90 tablet 1   levothyroxine (SYNTHROID) 50 MCG tablet Take 1 tablet (50 mcg total) by mouth daily. 90 tablet 1   loratadine (CLARITIN) 10 MG tablet Take 1 tablet (10 mg total) by mouth daily. 30 tablet 11   meclizine (ANTIVERT) 25 MG tablet Take 25 mg by mouth every 6 (six) hours as needed.     metFORMIN (GLUCOPHAGE-XR) 500 MG 24 hr tablet Take 4 tablets (2,000 mg total) by mouth daily with supper. 360 tablet 3   montelukast (SINGULAIR) 10 MG tablet TAKE ONE TABLET AT BEDTIME 90 tablet 1   rosuvastatin (CRESTOR) 40 MG tablet Take 1 tablet (40 mg total) by mouth daily. 90 tablet 1   Semaglutide, 1 MG/DOSE, (OZEMPIC, 1 MG/DOSE,) 4 MG/3ML SOPN Inject 1 mg into the skin once a week. 9 mL 3   Vitamin D, Ergocalciferol, (DRISDOL) 1.25 MG (50000 UNIT) CAPS capsule Take 1 capsule (50,000 Units total) by mouth every 7 (seven) days. 4 capsule 4   losartan (COZAAR) 100 MG tablet Take 1 tablet (100 mg total) by mouth daily. 90 tablet 1   No facility-administered medications prior to visit.    No Known Allergies  ROS See HPI    Objective:    Physical Exam Constitutional:      General: She is not in acute distress.    Appearance: Normal appearance. She is not ill-appearing.  HENT:     Head: Normocephalic and atraumatic.     Right Ear: External ear normal.     Left Ear: External ear normal.  Eyes:     Extraocular Movements: Extraocular movements intact.     Pupils: Pupils are equal, round, and reactive to light.  Cardiovascular:     Rate and Rhythm: Normal rate and regular rhythm.     Heart sounds: Normal heart sounds. No murmur heard.    No gallop.  Pulmonary:     Effort: Pulmonary effort  is normal. No respiratory distress.     Breath sounds: Normal breath sounds. No wheezing or rales.  Skin:    General: Skin is warm and dry.  Neurological:     Mental Status: She is alert and oriented to person, place, and time.  Psychiatric:        Mood and Affect: Mood normal.        Behavior: Behavior normal.        Judgment: Judgment normal.     BP (!) 154/70 (BP Location: Right Arm, Patient Position: Sitting, Cuff Size: Large)   Pulse 67   Temp 98.6 F (37 C) (Oral)   Resp 16  Wt 300 lb (136.1 kg)   SpO2 98%   BMI 48.42 kg/m  Wt Readings from Last 3 Encounters:  12/11/21 300 lb (136.1 kg)  11/09/21 (!) 308 lb 9.6 oz (140 kg)  11/05/21 (!) 312 lb 3.2 oz (141.6 kg)       Assessment & Plan:   Problem List Items Addressed This Visit       Unprioritized   Essential hypertension    BP Readings from Last 3 Encounters:  12/11/21 (!) 154/70  11/09/21 (!) 159/98  11/05/21 (!) 130/92  Uncontrolled despite increase in carvedilol. Will d/c losartan and begin valsartan 381m once daily. Continue diltiazem.       Relevant Medications   valsartan (DIOVAN) 320 MG tablet     Meds ordered this encounter  Medications   valsartan (DIOVAN) 320 MG tablet    Sig: Take 1 tablet (320 mg total) by mouth daily.    Dispense:  30 tablet    Refill:  3    Order Specific Question:   Supervising Provider    Answer:   BPenni HomansA [[id number]]    I, MNance Pear NP, personally preformed the services described in this documentation.  All medical record entries made by the scribe were at my direction and in my presence.  I have reviewed the chart and discharge instructions (if applicable) and agree that the record reflects my personal performance and is accurate and complete. 12/11/2021   I,Amber Collins,acting as a scribe for MNance Pear NP.,have documented all relevant documentation on the behalf of MNance Pear NP,as directed by  MNance Pear NP while  in the presence of MNance Pear NP.  MNance Pear NP

## 2021-12-13 ENCOUNTER — Telehealth: Payer: Self-pay

## 2021-12-13 NOTE — Telephone Encounter (Signed)
Medications arrived from pt assistance. Pin needles, ozempic and [place]. Pt has been contacted and will come by to pick up medications. Medications in two paper bags with labels, in refrigerator.

## 2021-12-14 ENCOUNTER — Other Ambulatory Visit: Payer: Self-pay | Admitting: *Deleted

## 2021-12-14 ENCOUNTER — Other Ambulatory Visit: Payer: Self-pay | Admitting: Family

## 2021-12-17 ENCOUNTER — Other Ambulatory Visit: Payer: Self-pay | Admitting: *Deleted

## 2021-12-20 ENCOUNTER — Other Ambulatory Visit: Payer: Self-pay

## 2021-12-20 MED ORDER — VALSARTAN 320 MG PO TABS: 320.0000 mg | ORAL_TABLET | Freq: Every day | ORAL | 0 refills | Status: DC

## 2021-12-21 ENCOUNTER — Telehealth: Payer: Medicare Other

## 2021-12-21 DIAGNOSIS — E1159 Type 2 diabetes mellitus with other circulatory complications: Secondary | ICD-10-CM | POA: Diagnosis not present

## 2021-12-21 DIAGNOSIS — I10 Essential (primary) hypertension: Secondary | ICD-10-CM | POA: Diagnosis not present

## 2021-12-21 DIAGNOSIS — J45909 Unspecified asthma, uncomplicated: Secondary | ICD-10-CM

## 2021-12-21 DIAGNOSIS — Z7984 Long term (current) use of oral hypoglycemic drugs: Secondary | ICD-10-CM | POA: Diagnosis not present

## 2021-12-21 DIAGNOSIS — E785 Hyperlipidemia, unspecified: Secondary | ICD-10-CM

## 2021-12-26 ENCOUNTER — Telehealth: Payer: Self-pay | Admitting: Pharmacist

## 2021-12-26 NOTE — Telephone Encounter (Signed)
Called to follow up with patient regarding patient assistance program for Ozempic and [name] (received in early June 2023) Also wanted to review home blood glucose readings.  Unable to reach patient. LM on VM.

## 2022-01-01 DIAGNOSIS — M799 Soft tissue disorder, unspecified: Secondary | ICD-10-CM | POA: Diagnosis not present

## 2022-01-01 DIAGNOSIS — G9601 Cranial cerebrospinal fluid leak, spontaneous: Secondary | ICD-10-CM | POA: Diagnosis not present

## 2022-01-01 DIAGNOSIS — G9600 Cerebrospinal fluid leak, unspecified: Secondary | ICD-10-CM | POA: Diagnosis not present

## 2022-01-01 DIAGNOSIS — J324 Chronic pansinusitis: Secondary | ICD-10-CM | POA: Diagnosis not present

## 2022-01-08 ENCOUNTER — Other Ambulatory Visit: Payer: Self-pay | Admitting: *Deleted

## 2022-01-08 NOTE — Patient Outreach (Signed)
[facility]) Care Management  01/08/2022  [name] 1951-06-24 [id number]  Nurse spoke with patient to inform them that their PCP has a [place] that will follow up with them in the future. Patient verbalized understanding.    Plan: RN Health Coach will close case.

## 2022-01-14 DIAGNOSIS — E079 Disorder of thyroid, unspecified: Secondary | ICD-10-CM | POA: Diagnosis not present

## 2022-01-14 DIAGNOSIS — Z794 Long term (current) use of insulin: Secondary | ICD-10-CM | POA: Diagnosis not present

## 2022-01-14 DIAGNOSIS — Z7983 Long term (current) use of bisphosphonates: Secondary | ICD-10-CM | POA: Diagnosis not present

## 2022-01-14 DIAGNOSIS — Z7982 Long term (current) use of aspirin: Secondary | ICD-10-CM | POA: Diagnosis not present

## 2022-01-14 DIAGNOSIS — Z7951 Long term (current) use of inhaled steroids: Secondary | ICD-10-CM | POA: Diagnosis not present

## 2022-01-14 DIAGNOSIS — I10 Essential (primary) hypertension: Secondary | ICD-10-CM | POA: Diagnosis not present

## 2022-01-14 DIAGNOSIS — Z79899 Other long term (current) drug therapy: Secondary | ICD-10-CM | POA: Diagnosis not present

## 2022-01-14 DIAGNOSIS — E119 Type 2 diabetes mellitus without complications: Secondary | ICD-10-CM | POA: Diagnosis not present

## 2022-01-14 DIAGNOSIS — G9600 Cerebrospinal fluid leak, unspecified: Secondary | ICD-10-CM | POA: Diagnosis not present

## 2022-01-14 DIAGNOSIS — S0990XA Unspecified injury of head, initial encounter: Secondary | ICD-10-CM | POA: Diagnosis not present

## 2022-01-14 DIAGNOSIS — Q018 Encephalocele of other sites: Secondary | ICD-10-CM | POA: Diagnosis not present

## 2022-01-14 DIAGNOSIS — Z7985 Long-term (current) use of injectable non-insulin antidiabetic drugs: Secondary | ICD-10-CM | POA: Diagnosis not present

## 2022-01-15 ENCOUNTER — Other Ambulatory Visit: Payer: Medicare Other

## 2022-01-15 DIAGNOSIS — E7800 Pure hypercholesterolemia, unspecified: Secondary | ICD-10-CM

## 2022-01-15 LAB — LIPID PANEL
Chol/HDL Ratio: 3.9 ratio (ref 0.0–4.4)
Cholesterol, Total: 120 mg/dL (ref 100–199)
HDL: 31 mg/dL — ABNORMAL LOW (ref >39)
LDL Chol Calc (NIH): 52 mg/dL (ref 0–99)
Triglycerides: 227 mg/dL — ABNORMAL HIGH (ref 0–149)
VLDL Cholesterol Cal: 37 mg/dL (ref 5–40)

## 2022-01-15 LAB — ALT: ALT: 39 IU/L — ABNORMAL HIGH (ref 0–32)

## 2022-01-22 ENCOUNTER — Encounter: Payer: Medicare Other | Admitting: Family Medicine

## 2022-01-22 ENCOUNTER — Ambulatory Visit (INDEPENDENT_AMBULATORY_CARE_PROVIDER_SITE_OTHER): Payer: Medicare Other | Admitting: Family

## 2022-01-22 ENCOUNTER — Encounter: Payer: Self-pay | Admitting: Family

## 2022-01-22 ENCOUNTER — Telehealth: Payer: Self-pay | Admitting: Family

## 2022-01-22 VITALS — BP 126/74 | HR 65 | Temp 98.2°F | Resp 16 | Ht 66.0 in | Wt 293.0 lb

## 2022-01-22 DIAGNOSIS — E1159 Type 2 diabetes mellitus with other circulatory complications: Secondary | ICD-10-CM

## 2022-01-22 DIAGNOSIS — I10 Essential (primary) hypertension: Secondary | ICD-10-CM

## 2022-01-22 DIAGNOSIS — Z0000 Encounter for general adult medical examination without abnormal findings: Secondary | ICD-10-CM

## 2022-01-22 DIAGNOSIS — E1165 Type 2 diabetes mellitus with hyperglycemia: Secondary | ICD-10-CM | POA: Diagnosis not present

## 2022-01-22 DIAGNOSIS — G4733 Obstructive sleep apnea (adult) (pediatric): Secondary | ICD-10-CM | POA: Diagnosis not present

## 2022-01-22 LAB — BASIC METABOLIC PANEL
BUN: 12 mg/dL (ref 6–23)
CO2: 28 mEq/L (ref 19–32)
Calcium: 9.5 mg/dL (ref 8.4–10.5)
Chloride: 102 mEq/L (ref 96–112)
Creatinine, Ser: 0.82 mg/dL (ref 0.40–1.20)
GFR: 72.09 mL/min (ref >60.00)
Glucose, Bld: 146 mg/dL — ABNORMAL HIGH (ref 70–99)
Potassium: 4.7 mEq/L (ref 3.5–5.1)
Sodium: 139 mEq/L (ref 135–145)

## 2022-01-22 NOTE — Assessment & Plan Note (Signed)
BP Readings from Last 3 Encounters:  01/22/22 126/74  12/11/21 (!) 154/70  11/09/21 (!) 159/98   At goal, continue diovan, diltiazem and carvedilol.

## 2022-01-22 NOTE — Assessment & Plan Note (Addendum)
Wt Readings from Last 3 Encounters:  01/22/22 293 lb (132.9 kg)  12/11/21 300 lb (136.1 kg)  11/09/21 (!) 308 lb 9.6 oz (140 kg)   Discussed diet/exercise/weight loss. She is deferring colonoscopy until she has recovered from her upcoming surgery for CSF leak. mammo up to date. Will request date of second shingrix from her pharmacy. Recommended covid booster/flu shot this fall.

## 2022-01-22 NOTE — Progress Notes (Signed)
Subjective:   By signing my name below, I, [name], attest that this documentation has been prepared under the direction and in the presence of Karie Chimera, NP 01/22/2022   [name], female    DOB: Oct 26, 1950, 71 y.o.   MRN: [id number]  Chief Complaint  Patient presents with   Annual Exam    HPI Patient is in today for a comprehensive physical exam  CSF: She reports that she has an upcoming CSF Surgery.  Knot on Left Shin: She complains of a knot on her lower left shin that was previously a bruise.  A1C: She is currently seeing an endocrinologist. She is scheduled for an appointment with her in 02/2022. Yesterday morning her blood sugar was 115 mg/dL.  Lab Results  Component Value Date   HGBA1C 9.1 (A) 11/05/2021   Blood Pressure: As of today's visit, her blood pressure is normal. She is currently taking 25 Mg of Carvedilol, 120 Mg of Diltiazem, and 320 Mg of Diovan. BP Readings from Last 3 Encounters:  01/22/22 126/74  12/11/21 (!) 154/70  11/09/21 (!) 159/98   Pulse Readings from Last 3 Encounters:  01/22/22 65  12/11/21 67  11/09/21 84   Cholesterol: Her triglyceride levels are elevating.  Lab Results  Component Value Date   CHOL 120 01/15/2022   HDL 31 (L) 01/15/2022   LDLCALC 52 01/15/2022   LDLDIRECT 56.0 07/16/2021   TRIG 227 (H) 01/15/2022   CHOLHDL 3.9 01/15/2022   She denies having any fever, new muscle pain, joint pain , new moles, congestion, sinus pain, sore throat, palpations, cough, SOB ,wheezing,n/v/d constipation, blood in stool, dysuria, frequency, hematuria, depression, anxiety, headaches at this time  Social history: She reports the removal of her complete thyroid last year. She previously had a history of goiters which was causing her troubles with breathing.  She denies of any changes to her family medical history. She reports that her mother has passed away.  Colonoscopy: Last completed on 03/28/2015. She  wants to wait until her surgery is over before scheduling an appointment for an updated colonoscopy.  Dexa: Last completed on 04/29/2016  Mammogram: Last completed on 09/12/2021 Immunizations: She is UTD on pneumonia, Covid-19, and tetanus vaccines. She states that she has received two Shingrix vaccines.  Diet: She is maintaining a healthy diet.  Exercise: She is currently walking as exercise.  Dental: She is UTD on dental exams.  Vision: She is UTD on vision exams. She reports that she is having cataract issues.    Health Maintenance Due  Topic Date Due   Zoster Vaccines- Shingrix (2 of 2) 06/16/2019   FOOT EXAM  06/02/2021   INFLUENZA VACCINE  01/22/2022    Past Medical History:  Diagnosis Date   Anemia 04/05/2012   Anxiety    Anxiety state 09/11/2007   Qualifier: Diagnosis of  By: Scherrie Gerlach     Asthma    Atrial tachycardia, paroxysmal ([place])    Back pain 10/02/2014   Breast cancer ([place]) 02/17/2017   CAD (coronary artery disease), native coronary artery    remote Cath with nonobstructive ASCAD with 20% mid LAD, 20% prox left circ and 20% ostial RCA   Chicken pox as a child   Chronic diastolic CHF (congestive heart failure), NYHA class 1 ([place])    Complication of anesthesia    pt states feels different in her body after anesthesia when waking up and also experiences a smell of burnt plastic for approx  a wk    Constipation    Depression    Diabetes ([place])    Dilated aortic root ([place])    60m by echo 08/2020   Dysuria 02/17/2017   Elevated LFTs 04/05/2012   GERD (gastroesophageal reflux disease)    Goiter    Heart murmur    hx of one at birth    History of kidney stones    History of radiation therapy 10/31/16-12/17/16   left breast 45 Gy in 25 fractions, left breast boost 16 Gy in 8 fractions   Hx of colonic polyps    Hyperlipidemia    Hypertension    Insomnia 10/08/2016   Joint pain    Malignant ne[place] of upper-outer quadrant of left [place] breast [place] 05/14/2016    not with patient   Measles as a child   Mumps as a child   OA (osteoarthritis) of knee 04/05/2012   Obesity 07/11/2014   PONV (postoperative nausea and vomiting)    Preventative health care 09/05/2013   PVC's (premature ventricular contractions) 05/02/2016   Shortness of breath dyspnea    walking distances / climbing stairs   Sleep apnea 04/05/2012   Tinea corporis 02/23/2013   Type 2 diabetes mellitus with hyperglycemia ([place] 12/31/2013   Vasomotor rhinitis 04/05/2012   Ventral hernia    Wheezing     Past Surgical History:  Procedure Laterality Date   BREAST LUMPECTOMY Left 2017   BREAST LUMPECTOMY WITH RADIOACTIVE SEED AND SENTINEL LYMPH NODE BIOPSY Left 06/21/2016   Procedure: LEFT BREAST LUMPECTOMY WITH RADIOACTIVE SEED AND SENTINEL LYMPH NODE BIOPSY, INJECT BLUE DYE LEFT BREAST;  Surgeon: HFanny Skates MD;  [facility]  Service: General;  Laterality: Left;   CARDIAC CATHETERIZATION     normal coroary arteries per patient   CARDIOVASCULAR STRESS TEST     10/12/2013   CESAREAN SECTION     X 3   CHOLECYSTECTOMY     COLONOSCOPY WITH PROPOFOL N/A 03/28/2015   Procedure: COLONOSCOPY WITH PROPOFOL;  Surgeon: JJuanita Craver MD;  Location: [facility];  Service: Endoscopy;  Laterality: N/A;   HERNIA REPAIR  02/08/2011   ventral hernia   JOINT REPLACEMENT     bilateral   KNEE ARTHROSCOPY  05/2010   bilateral   LEFT AND RIGHT HEART CATHETERIZATION WITH CORONARY ANGIOGRAM N/A 11/08/2013   Procedure: LEFT AND RIGHT HEART CATHETERIZATION WITH CORONARY ANGIOGRAM;  Surgeon: CBurnell Blanks MD;  [facility];  Service: Cardiovascular;  Laterality: N/A;   MENISCUS REPAIR  2009   MOUTH SURGERY     teeth implants   PORT-A-CATH REMOVAL N/A 01/24/2017   Procedure: REMOVAL PORT-A-CATH;  Surgeon: IFanny Skates MD;  [facility]  Service: General;  Laterality: N/A;   PORTACATH PLACEMENT Right 07/16/2016   Procedure: INSERTION PORT-A-CATH RIGHT INTERNAL JUGULAR WITH  ULTRASOUND;  Surgeon: HFanny Skates MD;  [facility]  Service: General;  Laterality: Right;   REPAIR DURAL / CSF LEAK  2021   [facility]   left   TOTAL KNEE ARTHROPLASTY Right 05/10/2014   Procedure: RIGHT TOTAL KNEE ARTHROPLASTY;  Surgeon: MMauri Pole MD;  Location: [facility];  Service: Orthopedics;  Laterality: Right;   TOTAL THYROIDECTOMY Bilateral 2022   TUBAL LIGATION     WISDOM TOOTH EXTRACTION  2000    Family History  Problem Relation Age of Onset   Thyroid disease Mother  hypothyroid   Hyperlipidemia Mother    Depression Mother    Anxiety disorder Mother    Heart attack Father 1   Hypertension Father    Arthritis Father        RA   Coronary artery disease Father    High Cholesterol Father    Coronary artery disease Brother    Heart disease Brother    Cancer Maternal Grandmother        colon   Heart attack Maternal Grandfather    Alcohol abuse Paternal Grandfather    Cancer Maternal Aunt        colon    Social History   Socioeconomic History   Marital status: Divorced    Spouse name: Not on file   Number of children: 3   Years of education: Not on file   Highest education level: Not on file  Occupational History   Occupation: retired - [employer]  Tobacco Use   Smoking status: Never   Smokeless tobacco: Never  Vaping Use   Vaping Use: Never used  Substance and Sexual Activity   Alcohol use: Not Currently    Comment: rarely   Drug use: No   Sexual activity: Never  Other Topics Concern   Not on file  Social History Narrative   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)   Social Determinants of Health   Financial Resource Strain: Low Risk  (08/13/2021)   Overall Financial Resource Strain (CARDIA)    Difficulty of Paying Living Expenses: Not hard at all  Food Insecurity: No Food Insecurity (08/10/2021)   Hunger Vital Sign    Worried About Running Out of  Food in the Last Year: Never true    Ran Out of Food in the Last Year: Never true  Transportation Needs: No Transportation Needs (08/10/2021)   PRAPARE - Hydrologist (Medical): No    Lack of Transportation (Non-Medical): No  Physical Activity: Insufficiently Active (09/21/2021)   Exercise Vital Sign    Days of Exercise per Week: 4 days    Minutes of Exercise per Session: 10 min  Stress: No Stress Concern Present (08/13/2021)   [place]    Feeling of Stress : Not at all  Social Connections: Moderately Isolated (08/13/2021)   Social Connection and Isolation Panel [NHANES]    Frequency of Communication with Friends and Family: More than three times a week    Frequency of Social Gatherings with Friends and Family: More than three times a week    Attends Religious Services: More than 4 times per year    Active Member of [employer] or Organizations: No    Attends Archivist Meetings: Never    Marital Status: Divorced  Human resources officer Violence: Not At Risk (08/13/2021)   Humiliation, Afraid, Rape, and Kick questionnaire    Fear of Current or Ex-Partner: No    Emotionally Abused: No    Physically Abused: No    Sexually Abused: No    Outpatient Medications Prior to Visit  Medication Sig Dispense Refill   acetaminophen (TYLENOL) 500 MG tablet Take 1,000 mg by mouth 2 (two) times daily as needed for moderate pain or headache.     albuterol (VENTOLIN HFA) 108 (90 Base) MCG/ACT inhaler Inhale 2 puffs every 4-6 hours as needed - rescue. 18 g 12   anastrozole (ARIMIDEX) 1 MG tablet Take 1 tablet (1 mg total) by mouth daily. 90 tablet  3   aspirin EC 81 MG tablet Take 81 mg by mouth daily. Swallow whole.     blood glucose meter kit and supplies Dispense based on patient and insurance preference. Use up to four times daily as directed. (FOR ICD-10 E10.9, E11.9). 1 each 0   budesonide-formoterol  (SYMBICORT) 160-4.5 MCG/ACT inhaler Inhale 2 puffs into the lungs 2 (two) times daily.     busPIRone (BUSPAR) 7.5 MG tablet Take 1 tablet (7.5 mg total) by mouth 3 (three) times daily. As needed for anxiety 90 tablet 5   carvedilol (COREG) 25 MG tablet Take 1 tablet (25 mg total) by mouth 2 (two) times daily with a meal. 60 tablet 3   diclofenac (VOLTAREN) 75 MG EC tablet Take 1 tablet (75 mg total) by mouth 2 (two) times daily. 180 tablet 0   diltiazem (CARDIZEM CD) 120 MG 24 hr capsule Take 1 capsule (120 mg total) by mouth daily. 90 capsule 1   Docusate Calcium (STOOL SOFTENER PO) Take by mouth daily as needed.     escitalopram (LEXAPRO) 20 MG tablet TAKE 1 TABLET EVERY MORNING AND 1/2 EVERY EVENING 135 tablet 1   esomeprazole (NEXIUM) 40 MG capsule Take 1 capsule (40 mg total) by mouth daily. 90 capsule 3   ezetimibe (ZETIA) 10 MG tablet Take 1 tablet (10 mg total) by mouth daily. 90 tablet 3   fenofibrate (TRICOR) 145 MG tablet TAKE ONE TABLET ONCE DAILY 90 tablet 1   fluticasone (FLONASE) 50 MCG/ACT nasal spray Place 2 sprays into both nostrils daily. 16 g 6   furosemide (LASIX) 20 MG tablet TAKE ONE TABLET ONCE DAILY 90 tablet 0   glimepiride (AMARYL) 1 MG tablet Take 1 tablet (1 mg total) by mouth daily before breakfast. 90 tablet 1   glucose blood (ACCU-CHEK AVIVA) test strip Twice daily (please use brand that patient insurance covers) 100 each 5   Hypromellose (ARTIFICIAL TEARS OP) Place 1 drop into both eyes daily as needed (dry eyes).      icosapent Ethyl (VASCEPA) 1 g capsule Take 2 capsules (2 g total) by mouth 2 (two) times daily. 360 capsule 3   insulin degludec (TRESIBA FLEXTOUCH) 200 UNIT/ML FlexTouch Pen Inject 20 Units into the skin daily. (Patient taking differently: Inject 14 Units into the skin daily.) 9 mL 3   Insulin Pen Needle 32G X 4 MM MISC Use 1x a day 100 each 3   levothyroxine (SYNTHROID) 200 MCG tablet Take 1 tablet (200 mcg total) by mouth daily. 90 tablet 1    levothyroxine (SYNTHROID) 50 MCG tablet Take 1 tablet (50 mcg total) by mouth daily. 90 tablet 1   loratadine (CLARITIN) 10 MG tablet Take 1 tablet (10 mg total) by mouth daily. 30 tablet 11   meclizine (ANTIVERT) 25 MG tablet Take 25 mg by mouth every 6 (six) hours as needed.     metFORMIN (GLUCOPHAGE-XR) 500 MG 24 hr tablet Take 4 tablets (2,000 mg total) by mouth daily with supper. 360 tablet 3   montelukast (SINGULAIR) 10 MG tablet TAKE ONE TABLET AT BEDTIME 90 tablet 1   rosuvastatin (CRESTOR) 40 MG tablet Take 1 tablet (40 mg total) by mouth daily. 90 tablet 1   Semaglutide, 1 MG/DOSE, (OZEMPIC, 1 MG/DOSE,) 4 MG/3ML SOPN Inject 1 mg into the skin once a week. 9 mL 3   valsartan (DIOVAN) 320 MG tablet Take 1 tablet (320 mg total) by mouth daily. 90 tablet 0   Vitamin D, Ergocalciferol, (DRISDOL) 1.25 MG (  50000 UNIT) CAPS capsule Take 1 capsule (50,000 Units total) by mouth every 7 (seven) days. 4 capsule 4   No facility-administered medications prior to visit.    No Known Allergies  Review of Systems  Constitutional:  Negative for fever.  HENT:  Negative for congestion, sinus pain and sore throat.   Respiratory:  Negative for cough, shortness of breath and wheezing.   Cardiovascular:  Negative for palpitations.  Gastrointestinal:  Negative for blood in stool, constipation, diarrhea, nausea and vomiting.  Genitourinary:  Negative for dysuria, frequency and hematuria.  Musculoskeletal:  Negative for joint pain and myalgias.       (+) Knot in Left Shin  Skin:        (-) [place]  Neurological:  Negative for headaches.  Psychiatric/Behavioral:  Negative for depression. The patient is not nervous/anxious.        Objective:    Physical Exam Constitutional:      General: She is not in acute distress.    Appearance: Normal appearance. She is not ill-appearing.  HENT:     Head: Normocephalic and atraumatic.     Right Ear: Tympanic membrane, ear canal and external ear normal.      Left Ear: Tympanic membrane, ear canal and external ear normal.     Mouth/Throat:     Pharynx: No oropharyngeal exudate.  Eyes:     Extraocular Movements: Extraocular movements intact.     Pupils: Pupils are equal, round, and reactive to light.  Neck:     Thyroid: No thyromegaly (surgically absent).  Cardiovascular:     Rate and Rhythm: Normal rate and regular rhythm.     Heart sounds: Normal heart sounds. No murmur heard.    No gallop.  Pulmonary:     Effort: Pulmonary effort is normal. No respiratory distress.     Breath sounds: Normal breath sounds. No wheezing or rales.  Abdominal:     General: Bowel sounds are normal. There is no distension.     Palpations: Abdomen is soft.     Tenderness: There is no abdominal tenderness. There is no guarding.  Musculoskeletal:     Comments: 5/5 strength in both upper and lower extremities    Lymphadenopathy:     Cervical: No cervical adenopathy.  Skin:    General: Skin is warm and dry.  Neurological:     Mental Status: She is alert and oriented to person, place, and time.     Deep Tendon Reflexes:     Reflex Scores:      Patellar reflexes are 2+ on the right side and 2+ on the left side. Psychiatric:        Mood and Affect: Mood normal.        Behavior: Behavior normal.        Judgment: Judgment normal.     BP 126/74 (BP Location: Right Arm, Patient Position: Sitting, Cuff Size: Large)   Pulse 65   Temp 98.2 F (36.8 C) (Oral)   Resp 16   Ht '5\' 6"'  (1.676 m)   Wt 293 lb (132.9 kg)   SpO2 98%   BMI 47.29 kg/m  Wt Readings from Last 3 Encounters:  01/22/22 293 lb (132.9 kg)  12/11/21 300 lb (136.1 kg)  11/09/21 (!) 308 lb 9.6 oz (140 kg)   Diabetic Foot Exam - Simple   No data filed        Assessment & Plan:   Problem List Items Addressed This Visit  Unprioritized   Preventative health care - Primary    Wt Readings from Last 3 Encounters:  01/22/22 293 lb (132.9 kg)  12/11/21 300 lb (136.1 kg)  11/09/21 (!)  308 lb 9.6 oz (140 kg)  Discussed diet/exercise/weight loss. She is deferring colonoscopy until she has recovered from her upcoming surgery for CSF leak. mammo up to date. Will request date of second shingrix from her pharmacy. Recommended covid booster/flu shot this fall.       Poorly controlled type 2 diabetes mellitus with circulatory disorder [facility])    Lab Results  Component Value Date   HGBA1C 9.1 (A) 11/05/2021   HGBA1C 8.6 (H) 07/16/2021   HGBA1C 8.7 (H) 03/20/2021   Lab Results  Component Value Date   LDLCALC 52 01/15/2022   CREATININE 0.77 07/16/2021  Has a follow up with Endo in September. Improved sugars at home.      Relevant Orders   Basic metabolic panel   Obstructive sleep apnea    Continues home cpap.      Essential hypertension    BP Readings from Last 3 Encounters:  01/22/22 126/74  12/11/21 (!) 154/70  11/09/21 (!) 159/98  At goal, continue diovan, diltiazem and carvedilol.       No orders of the defined types were placed in this encounter.   I, Nance Pear, NP, personally preformed the services described in this documentation.  All medical record entries made by the scribe were at my direction and in my presence.  I have reviewed the chart and discharge instructions (if applicable) and agree that the record reflects my personal performance and is accurate and complete. 01/22/2022   I,Amber Collins,acting as a scribe for Nance Pear, NP.,have documented all relevant documentation on the behalf of Nance Pear, NP,as directed by  Nance Pear, NP while in the presence of Nance Pear, NP.    Nance Pear, NP

## 2022-01-22 NOTE — Assessment & Plan Note (Signed)
Lab Results  Component Value Date   HGBA1C 9.1 (A) 11/05/2021   HGBA1C 8.6 (H) 07/16/2021   HGBA1C 8.7 (H) 03/20/2021   Lab Results  Component Value Date   LDLCALC 52 01/15/2022   CREATININE 0.77 07/16/2021   Has a follow up with Endo in September. Improved sugars at home.

## 2022-01-22 NOTE — Assessment & Plan Note (Signed)
Continues home cpap.

## 2022-01-22 NOTE — Telephone Encounter (Signed)
Please call [place] to request date of Shingrix #2

## 2022-01-23 NOTE — Telephone Encounter (Signed)
Per pharmacist shingles #2 was done 06/28/2019. Information updated

## 2022-01-25 ENCOUNTER — Telehealth: Payer: Self-pay | Admitting: Pharmacist

## 2022-01-25 NOTE — Telephone Encounter (Signed)
Called patient to discuss recent home blood glucose readings. She reports that she had A1c checked at home visit by [facility] nurse and was 7.0%. She is tolerating Ozempic well.  Received [place] and Me message that updated Rx for Symbicort needed. Paperwork completed and forward to PCP for review and signature.

## 2022-01-25 NOTE — Telephone Encounter (Signed)
-----   Message from Cherre Robins, [place] sent at 12/26/2021  9:33 AM EDT ----- Regarding: diabetes Check home blood glucose readings.

## 2022-02-06 ENCOUNTER — Telehealth: Payer: Self-pay

## 2022-02-06 NOTE — Telephone Encounter (Signed)
That is a normal blood sugar. Is she concerned that this is low?  If so, we can reassure her that this is fine.

## 2022-02-06 NOTE — Telephone Encounter (Signed)
Pt called to advise she was experiencing an upset stomach and after checking her blood sugar it was 77. Pt ate something but wanted to know how she should proceed.

## 2022-02-07 NOTE — Telephone Encounter (Signed)
Pt contacted and advised bs was not worrisome and as long as she was not experiencing any fatigue or weakness. Pt confirmed she understood.

## 2022-02-14 ENCOUNTER — Ambulatory Visit: Payer: Medicare Other | Admitting: Internal Medicine

## 2022-02-14 ENCOUNTER — Encounter: Payer: Self-pay | Admitting: Internal Medicine

## 2022-02-14 VITALS — BP 132/88 | HR 79 | Ht 66.0 in | Wt 287.2 lb

## 2022-02-14 DIAGNOSIS — E1165 Type 2 diabetes mellitus with hyperglycemia: Secondary | ICD-10-CM | POA: Diagnosis not present

## 2022-02-14 DIAGNOSIS — E1159 Type 2 diabetes mellitus with other circulatory complications: Secondary | ICD-10-CM | POA: Diagnosis not present

## 2022-02-14 DIAGNOSIS — Z85850 Personal history of malignant neoplasm of thyroid: Secondary | ICD-10-CM

## 2022-02-14 DIAGNOSIS — E890 Postprocedural hypothyroidism: Secondary | ICD-10-CM | POA: Diagnosis not present

## 2022-02-14 LAB — T4, FREE: Free T4: 1.39 ng/dL (ref 0.60–1.60)

## 2022-02-14 LAB — TSH: TSH: 1.96 u[IU]/mL (ref 0.35–5.50)

## 2022-02-14 LAB — POCT GLYCOSYLATED HEMOGLOBIN (HGB A1C): Hemoglobin A1C: 6.3 % — AB (ref 4.0–5.6)

## 2022-02-14 MED ORDER — TRESIBA FLEXTOUCH 200 UNIT/ML ~~LOC~~ SOPN: 12.0000 [IU] | PEN_INJECTOR | Freq: Every day | SUBCUTANEOUS | 3 refills | Status: DC

## 2022-02-14 NOTE — Patient Instructions (Addendum)
Please switch: - Metformin ER 2000 mg at dinnertime  Stop: - Glimepiride   Continue: - Ozempic 1 mg weekly in a.m.   Decrease: - Tresiba U200 12 units daily  Continue Levothyroxine 250 mcg daily.  Take the thyroid hormone every day, with water, at least 30 minutes before breakfast, separated by at least 4 hours from: - acid reflux medications - calcium - iron - multivitamins  Please return in 3-4 months with your sugar log.

## 2022-02-14 NOTE — Progress Notes (Signed)
[name], female   DOB: [birth year], 71 y.o.   MRN: [id number]  HPI: [name] is a 71 y.o.-year-old female, initially referred by her PCP, Dr. Charlett Blake, returning for follow-up for DM2, dx in 2019 after her breast cancer treatment, insulin-dependent, uncontrolled, with complications (CAD, dCHF), h/o papillary thyroid cancer and postsurgical hypothyroidism.  Last visit 3 months ago.  Interim history: No increased urination, blurry vision, nausea, chest pain.   She  continues to have leg swelling. She had leg weakness  - fell 2x in last mo. No fractures.  She lost weight since last visit-22 pounds per our scale after starting a plant-based diet 2 mo ago.  DM2:  Reviewed HbA1c: Lab Results  Component Value Date   HGBA1C 9.1 (A) 11/05/2021   HGBA1C 8.6 (H) 07/16/2021   HGBA1C 8.7 (H) 03/20/2021   HGBA1C 9.7 (H) 12/06/2020   HGBA1C 8.3 (H) 09/05/2020   HGBA1C 9.5 (H) 06/02/2020   HGBA1C 7.0 (H) 09/06/2019   HGBA1C 6.3 (H) 07/02/2018   HGBA1C 9.5 (H) 03/26/2018   HGBA1C 8.7 (H) 12/22/2017   Pt is on a regimen of: - Metformin 1000 mg 2x a day, with meals >> 2000 mg at night >> 1000 mg 2x a day - Glimepiride 1 mg with breakfast >> before breakfast -  Ozempic 0.25 >> 0.5 >> 1 mg weekly - Tresiba U200 14 >> 16 units daily - started 10/2021 She was previously on Ozempic  [facility].  He was tolerating it well but she had to stop it when she stopped going to the weight management clinic during the coronavirus pandemic.  Pt checks her sugars 1-2x a day: - am: 118-122 >> 187-233 >> 171-235, 251 >> 110-117 - 2h after b'fast: n/c - before lunch: n/c  - 2h after lunch: n/c>> 120-255 >> 120-180 >> 70s - before dinner: n 125-166 >> 134-164 >> 100-120 - 2h after dinner: n/c >> 171 >> 125-225 >> n/c - bedtime: n/c >> 164-185 >> 129, 214 >> n/c - nighttime: n/c Lowest sugar was 120 >> 70; + hypoglycemia awareness in the 70s. Highest sugar  was 278 >> 251 >> 130.  Glucometer: Accu-Chek  Pt's meals are: - Breakfast: eggs, sometimes sandwich with sliced [place], yoghurt - Lunch: sandwich or taco with [place] - Dinner: green bean + onion soup sauce + potato + grilled chicken - Snacks: yasso bars, cucumber Diet green tea/soda.  - no CKD, last BUN/creatinine:  Lab Results  Component Value Date   BUN 12 01/22/2022   BUN 18 07/16/2021   CREATININE 0.82 01/22/2022   CREATININE 0.77 07/16/2021  She is is on losartan 100 mg daily.  -+ HL; last set of lipids: Lab Results  Component Value Date   CHOL 120 01/15/2022   HDL 31 (L) 01/15/2022   LDLCALC 52 01/15/2022   LDLDIRECT 56.0 07/16/2021   TRIG 227 (H) 01/15/2022   CHOLHDL 3.9 01/15/2022  She is on Crestor 40 mg daily, Zetia 10 mg daily, Tricor 145 mg daily. She is not taking Vascepa.  - last eye exam was in 10/2021. No DR. she has a history of cataracts.  - no numbness and tingling in her feet.  Pt has FH of DM in [place].  She also has a history of postsurgical hypothyroidism:  She is on levothyroxine 250 mcg daily: - in am - fasting - at least 30-60 min from b'fast - no calcium - no iron - no multivitamins - +  PPIs (Nexium) now >4h after Levothyroxine - not on Biotin  Reviewed previous TFTs: Lab Results  Component Value Date   TSH 5.18 08/31/2021   TSH 8.97 (H) 07/16/2021   TSH 7.93 (H) 06/22/2021   TSH 21.15 (H) 05/07/2021   TSH 21.89 (H) 03/20/2021   TSH 29.57 (H) 12/06/2020   TSH 12.31 (A) 08/04/2020   TSH 1.84 09/06/2019   TSH 2.130 03/26/2018   TSH 1.87 12/22/2017   TSH 0.81 09/22/2017   TSH 1.31 05/22/2017   TSH 1.84 02/17/2017   TSH 0.95 05/02/2016   TSH 1.22 10/16/2015   TSH 0.89 04/17/2015   TSH 1.20 10/13/2014   TSH 1.34 07/11/2014   TSH 0.557 12/31/2013   TSH 0.997 09/02/2013   Lab Results  Component Value Date   FREET4 1.33 03/26/2018   No FH of thyroid cancer. No h/o radiation tx to head or neck. No herbal supplements. No  Biotin use. No recent steroids use.   She also has a history of a goiter >> s/p thyroidectomy 06/2020 >> papillary thyroid cancer  I reviewed the report of the neck CT (06/22/2020): Thyroid: Markedly diffusely enlarged and heterogeneous with multiple poorly defined nodules. This extends into the superior aspect of the superior mediastinum. At the thoracic inlet, this is causing moderate narrowing of the trachea with a transverse diameter of 9 mm. This measured 6 mm on the chest CTA.  A thyroid U/S (04/29/2016) showed no individual nodules: Parenchymal Echotexture: Markedly heterogenous Estimated total number of nodules >/= 1 cm: 0 Isthmus: 1.3 cm; Heterogeneous parenchyma, enlarged. Right lobe: 11.1 cm x 5.2 cm x 4.6 cm; Heterogeneous lobulated parenchyma with no focal nodule. Left lobe: 9.0 cm x 3.6 cm x 6.0 cm; Heterogeneous lobulated parenchyma with no focal nodule.   IMPRESSION: Enlarged multilobulated thyroid compatible with multinodular goiter and medical thyroid disease. No focal nodule identified.  Total thyroidectomy (07/24/2020): A: Substernal thyroid, total substernal thyroidectomy - Multinodular goiter, clinically substernal. - Incidental microscopic papillary thyroid carcinoma, 1 mm in greatest dimension, margins uninvolved. - See synoptic template below. - One lymph node from soft tissue adjacent to thyroid gland with no tumor seen (0/1).   B: Thyroid, pyramidal lobe tissue, excision - Benign thyroid tissue with adenomatous nodule, no tumor seen.   She also has a history of HTN, GERD, osteoarthritis, anxiety/depression, vitamin D deficiency, breast cancer, urinary incontinence, frequency, urgency, transaminitis, OSA -on CPAP.   No personal h/o pancreatitis or FH of MTC.  ROS: + See HPI, also: ENT: + hypoacusis Cardiovascular: no SOB, + leg swelling  Past Medical History:  Diagnosis Date   Anemia 04/05/2012   Anxiety    Anxiety state 09/11/2007   Qualifier:  Diagnosis of  By: Scherrie Gerlach     Asthma    Atrial tachycardia, paroxysmal ([place])    Back pain 10/02/2014   Breast cancer ([place]) 02/17/2017   CAD (coronary artery disease), native coronary artery    remote Cath with nonobstructive ASCAD with 20% mid LAD, 20% prox left circ and 20% ostial RCA   Chicken pox as a child   Chronic diastolic CHF (congestive heart failure), NYHA class 1 ([place])    Complication of anesthesia    pt states feels different in her body after anesthesia when waking up and also experiences a smell of burnt plastic for approx a wk    Constipation    Depression    Diabetes ([place] [place])    Dilated aortic root ([facility])    63m by echo  08/2020   Dysuria 02/17/2017   Elevated LFTs 04/05/2012   GERD (gastroesophageal reflux disease)    Goiter    Heart murmur    hx of one at birth    History of kidney stones    History of radiation therapy 10/31/16-12/17/16   left breast 45 Gy in 25 fractions, left breast boost 16 Gy in 8 fractions   Hx of colonic polyps    Hyperlipidemia    Hypertension    Insomnia 10/08/2016   Joint pain    Malignant neoplasm of upper-outer quadrant of left female breast ([place]) 05/14/2016   not with patient   Measles as a child   Mumps as a child   OA (osteoarthritis) of knee 04/05/2012   Obesity 07/11/2014   PONV (postoperative nausea and vomiting)    Preventative health care 09/05/2013   PVC's (premature ventricular contractions) 05/02/2016   Shortness of breath dyspnea    walking distances / climbing stairs   Sleep apnea 04/05/2012   Tinea corporis 02/23/2013   Type 2 diabetes mellitus with hyperglycemia ([place]) 12/31/2013   Vasomotor rhinitis 04/05/2012   Ventral hernia    Wheezing    Past Surgical History:  Procedure Laterality Date   BREAST LUMPECTOMY Left 2017   BREAST LUMPECTOMY WITH RADIOACTIVE SEED AND SENTINEL LYMPH NODE BIOPSY Left 06/21/2016   Procedure: LEFT BREAST LUMPECTOMY WITH RADIOACTIVE SEED AND SENTINEL LYMPH NODE BIOPSY, INJECT BLUE DYE  LEFT BREAST;  Surgeon: Fanny Skates, MD;  Location: [facility];  Service: General;  Laterality: Left;   CARDIAC CATHETERIZATION     normal coroary arteries per patient   CARDIOVASCULAR STRESS TEST     10/12/2013   CESAREAN SECTION     X 3   CHOLECYSTECTOMY     COLONOSCOPY WITH PROPOFOL N/A 03/28/2015   Procedure: COLONOSCOPY WITH PROPOFOL;  Surgeon: Juanita Craver, MD;  Location: [facility];  Service: Endoscopy;  Laterality: N/A;   HERNIA REPAIR  02/08/2011   ventral hernia   JOINT REPLACEMENT     bilateral   KNEE ARTHROSCOPY  05/2010   bilateral   LEFT AND RIGHT HEART CATHETERIZATION WITH CORONARY ANGIOGRAM N/A 11/08/2013   Procedure: LEFT AND RIGHT HEART CATHETERIZATION WITH CORONARY ANGIOGRAM;  Surgeon: [name], MD;  Location: [facility] CATH LAB;  Service: Cardiovascular;  Laterality: N/A;   MENISCUS REPAIR  2009   MOUTH SURGERY     teeth implants   PORT-A-CATH REMOVAL N/A 01/24/2017   Procedure: REMOVAL PORT-A-CATH;  Surgeon: Fanny Skates, MD;  Location: [facility] [address];  Service: General;  Laterality: N/A;   PORTACATH PLACEMENT Right 07/16/2016   Procedure: INSERTION PORT-A-CATH RIGHT INTERNAL JUGULAR WITH ULTRASOUND;  Surgeon: Fanny Skates, MD;  Location: [facility];  Service: General;  Laterality: Right;   REPAIR DURAL / CSF LEAK  2021   performed at [facility] in [place]  2011   left   TOTAL KNEE ARTHROPLASTY Right 05/10/2014   Procedure: RIGHT TOTAL KNEE ARTHROPLASTY;  Surgeon: Mauri Pole, MD;  Location: [facility];  Service: Orthopedics;  Laterality: Right;   TOTAL THYROIDECTOMY Bilateral 2022   TUBAL LIGATION     WISDOM TOOTH EXTRACTION  2000   Social History   Socioeconomic History   Marital status: Divorced    Spouse name: Not on file   Number of children: 3   Years of education: Not on file   Highest education level: Not on file  Occupational History   Occupation: retired - [employer]  Tobacco Use   Smoking status: Never    Smokeless tobacco: Never  Vaping Use   Vaping Use: Never used  Substance and Sexual Activity   Alcohol use: Not Currently    Comment: rarely   Drug use: No   Sexual activity: Never  Other Topics Concern   Not on file  Social History Narrative   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)   Social Determinants of Health   Financial Resource Strain: [place]  (08/13/2021)   Overall Financial Resource Strain (CARDIA)    Difficulty of Paying Living Expenses: Not hard at all  Food Insecurity: No Food Insecurity (08/10/2021)   Hunger Vital Sign    Worried About Running Out of Food in the Last Year: Never true    Ran Out of Food in the Last Year: Never true  Transportation Needs: No Transportation Needs (08/10/2021)   PRAPARE - Hydrologist (Medical): No    Lack of Transportation (Non-Medical): No  Physical Activity: Insufficiently Active (09/21/2021)   Exercise Vital Sign    Days of Exercise per Week: 4 days    Minutes of Exercise per Session: 10 min  Stress: No Stress Concern Present (08/13/2021)   [place]    Feeling of Stress : Not at all  Social Connections: Moderately Isolated (08/13/2021)   Social Connection and Isolation Panel [NHANES]    Frequency of Communication with Friends and Family: More than three times a week    Frequency of Social Gatherings with Friends and Family: More than three times a week    Attends Religious Services: More than 4 times per year    Active Member of [employer] or Organizations: No    Attends Archivist Meetings: Never    Marital Status: Divorced  Human resources officer Violence: Not At Risk (08/13/2021)   Humiliation, Afraid, Rape, and Kick questionnaire    Fear of Current or Ex-Partner: No    Emotionally Abused: No    Physically Abused: No    Sexually Abused: No   Current Outpatient Medications on File Prior to Visit  Medication Sig  Dispense Refill   acetaminophen (TYLENOL) 500 MG tablet Take 1,000 mg by mouth 2 (two) times daily as needed for moderate pain or headache.     albuterol (VENTOLIN HFA) 108 (90 Base) MCG/ACT inhaler Inhale 2 puffs every 4-6 hours as needed - rescue. 18 g 12   anastrozole (ARIMIDEX) 1 MG tablet Take 1 tablet (1 mg total) by mouth daily. 90 tablet 3   aspirin EC 81 MG tablet Take 81 mg by mouth daily. Swallow whole.     blood glucose meter kit and supplies Dispense based on patient and insurance preference. Use up to four times daily as directed. (FOR ICD-10 E10.9, E11.9). 1 each 0   budesonide-formoterol (SYMBICORT) 160-4.5 MCG/ACT inhaler Inhale 2 puffs into the lungs 2 (two) times daily.     busPIRone (BUSPAR) 7.5 MG tablet Take 1 tablet (7.5 mg total) by mouth 3 (three) times daily. As needed for anxiety 90 tablet 5   carvedilol (COREG) 25 MG tablet Take 1 tablet (25 mg total) by mouth 2 (two) times daily with a meal. 60 tablet 3   diclofenac (VOLTAREN) 75 MG EC tablet Take 1 tablet (75 mg total) by mouth 2 (two) times daily. 180 tablet 0   diltiazem (CARDIZEM CD) 120 MG 24 hr capsule Take 1 capsule (120 mg total) by  mouth daily. 90 capsule 1   Docusate Calcium (STOOL SOFTENER PO) Take by mouth daily as needed.     escitalopram (LEXAPRO) 20 MG tablet TAKE 1 TABLET EVERY MORNING AND 1/2 EVERY EVENING 135 tablet 1   esomeprazole (NEXIUM) 40 MG capsule Take 1 capsule (40 mg total) by mouth daily. 90 capsule 3   ezetimibe (ZETIA) 10 MG tablet Take 1 tablet (10 mg total) by mouth daily. 90 tablet 3   fenofibrate (TRICOR) 145 MG tablet TAKE ONE TABLET ONCE DAILY 90 tablet 1   fluticasone (FLONASE) 50 MCG/ACT nasal spray Place 2 sprays into both nostrils daily. 16 g 6   furosemide (LASIX) 20 MG tablet TAKE ONE TABLET ONCE DAILY 90 tablet 0   glimepiride (AMARYL) 1 MG tablet Take 1 tablet (1 mg total) by mouth daily before breakfast. 90 tablet 1   glucose blood (ACCU-CHEK AVIVA) test strip Twice daily  (please use brand that patient insurance covers) 100 each 5   Hypromellose (ARTIFICIAL TEARS OP) Place 1 drop into both eyes daily as needed (dry eyes).      icosapent Ethyl (VASCEPA) 1 g capsule Take 2 capsules (2 g total) by mouth 2 (two) times daily. 360 capsule 3   insulin degludec (TRESIBA FLEXTOUCH) 200 UNIT/ML FlexTouch Pen Inject 20 Units into the skin daily. (Patient taking differently: Inject 14 Units into the skin daily.) 9 mL 3   Insulin Pen Needle 32G X 4 MM MISC Use 1x a day 100 each 3   levothyroxine (SYNTHROID) 200 MCG tablet Take 1 tablet (200 mcg total) by mouth daily. 90 tablet 1   levothyroxine (SYNTHROID) 50 MCG tablet Take 1 tablet (50 mcg total) by mouth daily. 90 tablet 1   loratadine (CLARITIN) 10 MG tablet Take 1 tablet (10 mg total) by mouth daily. 30 tablet 11   meclizine (ANTIVERT) 25 MG tablet Take 25 mg by mouth every 6 (six) hours as needed.     metFORMIN (GLUCOPHAGE-XR) 500 MG 24 hr tablet Take 4 tablets (2,000 mg total) by mouth daily with supper. 360 tablet 3   montelukast (SINGULAIR) 10 MG tablet TAKE ONE TABLET AT BEDTIME 90 tablet 1   rosuvastatin (CRESTOR) 40 MG tablet Take 1 tablet (40 mg total) by mouth daily. 90 tablet 1   Semaglutide, 1 MG/DOSE, (OZEMPIC, 1 MG/DOSE,) 4 MG/3ML SOPN Inject 1 mg into the skin once a week. 9 mL 3   valsartan (DIOVAN) 320 MG tablet Take 1 tablet (320 mg total) by mouth daily. 90 tablet 0   Vitamin D, Ergocalciferol, (DRISDOL) 1.25 MG (50000 UNIT) CAPS capsule Take 1 capsule (50,000 Units total) by mouth every 7 (seven) days. 4 capsule 4   No current facility-administered medications on file prior to visit.   No Known Allergies  Family History  Problem Relation Age of Onset   Thyroid disease Mother        hypothyroid   Hyperlipidemia Mother    Depression Mother    Anxiety disorder Mother    Heart attack Father 33   Hypertension Father    Arthritis Father        RA   Coronary artery disease Father    High  Cholesterol Father    Coronary artery disease Brother    Heart disease Brother    Cancer Maternal Grandmother        colon   Heart attack Maternal Grandfather    Alcohol abuse Paternal Grandfather    Cancer Maternal Aunt  colon   PE: BP 132/88 (BP Location: Right Arm, Patient Position: Sitting, Cuff Size: Normal)   Pulse 79   Ht _0  (1.676 m)   Wt 287 lb 3.2 oz (130.3 kg)   SpO2 98%   BMI 46.36 kg/m  Wt Readings from Last 3 Encounters:  02/14/22 287 lb 3.2 oz (130.3 kg)  01/22/22 293 lb (132.9 kg)  12/11/21 300 lb (136.1 kg)   Constitutional: overweight, in NAD Eyes: no exophthalmos ENT: moist mucous membranes, no masses palpated in neck, no cervical lymphadenopathy Cardiovascular: RRR, No MRG, + B periankle edema Respiratory: CTA B Musculoskeletal: no deformities Skin: moist, warm, no rashes Neurological: no tremor with outstretched hands Diabetic Foot Exam - Simple   Simple Foot Form Diabetic Foot exam was performed with the following findings: Yes 02/14/2022 11:16 AM  Visual Inspection No deformities, no ulcerations, no other skin breakdown bilaterally: Yes See comments: Yes Sensation Testing Intact to touch and monofilament testing bilaterally: Yes Pulse Check Posterior Tibialis and Dorsalis pulse intact bilaterally: Yes Comments + dry, flaky skin + B edema     ASSESSMENT: 1. DM2, non-insulin-dependent, uncontrolled, with complications - CAD - dCHF  2.  Postsurgical hypothyroidism  3.  Microscopic papillary thyroid cancer  PLAN:  1. Patient with longstanding, uncontrolled, type 2 diabetes, on oral antidiabetic regimen with metformin and sulfonylurea, also weekly GLP-1 receptor agonist, to which I recommended to add basal insulin at last visit.  At that time, sugars are high in the morning, mostly in the 200s and slightly better later in the day.  At that time she was preparing to start patient assistance with [employer] for [place] so I recommended  to add [place].  We continued the rest of the regimen.  HbA1c was higher, at 9.1%. -At today's visit, after starting on a plant-based diet 2 months ago, her sugars are excellent, at goal in the morning and later in the day, but he has blood sugars in the 70s after lunch.  She does feel these as low blood sugars.  At this visit, we discussed about de-escalating her regimen: We will first stop the glimepiride in the morning, which is possibly causing blood sugars midday.  I also advised her to reduce the Tresiba dose and will move the entire metformin dose at dinnertime to help with morning sugars. Patient Instructions  Please switch: - Metformin ER 2000 mg at dinnertime  Stop: - Glimepiride   Continue: - Ozempic 1 mg weekly in a.m.   Decrease: - Tresiba U200 12 units daily  Continue Levothyroxine 250 mcg daily.  Take the thyroid hormone every day, with water, at least 30 minutes before breakfast, separated by at least 4 hours from: - acid reflux medications - calcium - iron - multivitamins  Please return in 3-4 months with your sugar log.  - we checked her HbA1c: 6.3% (MUCH lower) - advised to check sugars at different times of the day - 1-2x a day, rotating check times - advised for yearly eye exams >> she is UTD - return to clinic in 3-4 months  2.  Postsurgical hypothyroidism - latest thyroid labs reviewed with pt. >> normal: Lab Results  Component Value Date   TSH 5.18 08/31/2021  - she continues on LT4 250 mcg daily -I previously recommended to chew the tablet for better absorption - pt feels good on this dose. - we discussed about taking the thyroid hormone every day, with water, >30 minutes before breakfast, separated by >4 hours from acid  reflux medications, calcium, iron, multivitamins. Pt. is taking it correctly.  She is taking Nexium in the afternoon, in the past she was taking this in the morning. - will check thyroid tests today: TSH and fT4 - If labs are abnormal,  she will need to return for repeat TFTs in 1.5 months  3.  Microscopic papillary thyroid cancer -She had 1 focus of 1 mm papillary thyroid cancer per review of records [facility] -This was incidentally discovered on pathology after removal of compressive goiter -I discussed with patient and her daughter in the past that she was most likely cured and no intervention is needed -We will continue to monitor her clinically  Component     Latest Ref Rng 02/14/2022  TSH     0.35 - 5.50 uIU/mL 1.96   T4,Free(Direct)     0.60 - 1.60 ng/dL 1.39   Normal TFTs.  Philemon Kingdom, MD PhD [facility] Endocrinology

## 2022-02-16 ENCOUNTER — Other Ambulatory Visit: Payer: Self-pay | Admitting: Family

## 2022-02-19 ENCOUNTER — Encounter (HOSPITAL_BASED_OUTPATIENT_CLINIC_OR_DEPARTMENT_OTHER): Payer: Self-pay | Admitting: Cardiology

## 2022-02-19 DIAGNOSIS — K219 Gastro-esophageal reflux disease without esophagitis: Secondary | ICD-10-CM | POA: Diagnosis not present

## 2022-02-19 DIAGNOSIS — I10 Essential (primary) hypertension: Secondary | ICD-10-CM | POA: Diagnosis not present

## 2022-02-19 DIAGNOSIS — E1165 Type 2 diabetes mellitus with hyperglycemia: Secondary | ICD-10-CM | POA: Diagnosis not present

## 2022-02-19 DIAGNOSIS — Z01818 Encounter for other preprocedural examination: Secondary | ICD-10-CM | POA: Diagnosis not present

## 2022-02-19 DIAGNOSIS — E785 Hyperlipidemia, unspecified: Secondary | ICD-10-CM | POA: Diagnosis not present

## 2022-02-19 DIAGNOSIS — E039 Hypothyroidism, unspecified: Secondary | ICD-10-CM | POA: Diagnosis not present

## 2022-02-19 DIAGNOSIS — J4520 Mild intermittent asthma, uncomplicated: Secondary | ICD-10-CM | POA: Diagnosis not present

## 2022-02-26 ENCOUNTER — Ambulatory Visit: Payer: Self-pay

## 2022-02-26 NOTE — Patient Instructions (Addendum)
Visit Information  Thank you for taking time to visit with me today. Please don't hesitate to contact me if I can be of assistance to you.   Following are the goals we discussed today:   Goals Addressed             This Visit's Progress    Pre CFS surgery       Care Coordination Interventions: Advised patient to contact provider with questions as needed Confirmed patient has help at home once discharged Encouraged to follow up with provider appointments as scheduled Confirmed patient has transportation to provider appointments Encouraged patient to contact [employer](per patient health insurance) to discuss meal assistance benefit post hospitalization.         Our next appointment is by telephone on 03/21/22 at 1:15 pm  Please call the care guide team at [phone] if you need to cancel or reschedule your appointment.   If you are experiencing a Mental Health or [place] or need someone to talk to, please call the Suicide and Crisis Lifeline: 988  Patient verbalizes understanding of instructions and care plan provided today and agrees to view in [place]. Active MyChart status and patient understanding of how to access instructions and care plan via MyChart confirmed with patient.     Thea Silversmith, RN, MSN, BSN, CCM Care Coordinator [phone]

## 2022-02-26 NOTE — Patient Outreach (Signed)
  Care Coordination   Initial Visit Note   02/26/2022 Name: [name] MRN: [id number] DOB: 1951/03/24  [name] is a 71 y.o. year old female who sees [name] Lukes, MD for primary care. I spoke with  [name] by phone today.  What matters to the patients health and wellness today?  Reports preparing for CSF surgery on 03/07/22. Patient reports has had this in the past, therefore is familiar with the surgery. However, acknowledges it is still a major surgery.   Goals Addressed             This Visit's Progress    Pre CFS surgery       Care Coordination Interventions: Advised patient to contact provider with questions as needed Confirmed patient has help at home once discharged Encouraged to follow up with provider appointments as scheduled Confirmed patient has transportation to provider appointments Encouraged patient to contact [employer](per patient health insurance) to discuss meal assistance benefit post hospitalization.         SDOH assessments and interventions completed:  Yes  SDOH Interventions Today    Flowsheet Row Most Recent Value  SDOH Interventions   Food Insecurity Interventions Intervention Not Indicated  Transportation Interventions Intervention Not Indicated  Utilities Interventions Intervention Not Indicated        Care Coordination Interventions Activated:  Yes  Care Coordination Interventions:  Yes, provided   Follow up plan: Follow up call scheduled for 03/21/22    Encounter Outcome:  Pt. Visit Completed   Thea Silversmith, RN, MSN, BSN, [place] Coordinator [phone]

## 2022-02-28 ENCOUNTER — Other Ambulatory Visit: Payer: Self-pay | Admitting: Family Medicine

## 2022-03-07 DIAGNOSIS — J4520 Mild intermittent asthma, uncomplicated: Secondary | ICD-10-CM | POA: Diagnosis not present

## 2022-03-07 DIAGNOSIS — M179 Osteoarthritis of knee, unspecified: Secondary | ICD-10-CM | POA: Diagnosis not present

## 2022-03-07 DIAGNOSIS — G9601 Cranial cerebrospinal fluid leak, spontaneous: Secondary | ICD-10-CM | POA: Diagnosis not present

## 2022-03-07 DIAGNOSIS — I5022 Chronic systolic (congestive) heart failure: Secondary | ICD-10-CM | POA: Diagnosis not present

## 2022-03-07 DIAGNOSIS — E039 Hypothyroidism, unspecified: Secondary | ICD-10-CM | POA: Diagnosis not present

## 2022-03-07 DIAGNOSIS — F32A Depression, unspecified: Secondary | ICD-10-CM | POA: Diagnosis not present

## 2022-03-07 DIAGNOSIS — Z85850 Personal history of malignant neoplasm of thyroid: Secondary | ICD-10-CM | POA: Diagnosis not present

## 2022-03-07 DIAGNOSIS — Q018 Encephalocele of other sites: Secondary | ICD-10-CM | POA: Diagnosis not present

## 2022-03-07 DIAGNOSIS — K219 Gastro-esophageal reflux disease without esophagitis: Secondary | ICD-10-CM | POA: Diagnosis not present

## 2022-03-07 DIAGNOSIS — E785 Hyperlipidemia, unspecified: Secondary | ICD-10-CM | POA: Diagnosis not present

## 2022-03-07 DIAGNOSIS — I890 Lymphedema, not elsewhere classified: Secondary | ICD-10-CM | POA: Diagnosis not present

## 2022-03-07 DIAGNOSIS — E1142 Type 2 diabetes mellitus with diabetic polyneuropathy: Secondary | ICD-10-CM | POA: Diagnosis not present

## 2022-03-07 DIAGNOSIS — G4733 Obstructive sleep apnea (adult) (pediatric): Secondary | ICD-10-CM | POA: Diagnosis not present

## 2022-03-07 DIAGNOSIS — I110 Hypertensive heart disease with heart failure: Secondary | ICD-10-CM | POA: Diagnosis not present

## 2022-03-07 DIAGNOSIS — G9600 Cerebrospinal fluid leak, unspecified: Secondary | ICD-10-CM | POA: Diagnosis not present

## 2022-03-07 DIAGNOSIS — I2510 Atherosclerotic heart disease of native coronary artery without angina pectoris: Secondary | ICD-10-CM | POA: Diagnosis not present

## 2022-03-11 ENCOUNTER — Telehealth: Payer: Self-pay | Admitting: Internal Medicine

## 2022-03-11 NOTE — Telephone Encounter (Signed)
Terry from [place] called stating patient was seen at [facility] hospital over the weekend and they discharged her with o2 at night, once [employer] went to sign paperwork, saw that she was not qualified for oxygen.   Please advise, call patient to determine next steps at [phone].

## 2022-03-13 DIAGNOSIS — J310 Chronic rhinitis: Secondary | ICD-10-CM | POA: Diagnosis not present

## 2022-03-13 NOTE — Telephone Encounter (Signed)
ATC patient but got no answer. We need to get patient in for an appointment with Dr Annamaria Boots to get her qualified for oxygen.

## 2022-03-14 ENCOUNTER — Ambulatory Visit: Payer: Medicare Other | Admitting: Pharmacist

## 2022-03-14 DIAGNOSIS — I2510 Atherosclerotic heart disease of native coronary artery without angina pectoris: Secondary | ICD-10-CM

## 2022-03-14 DIAGNOSIS — E1165 Type 2 diabetes mellitus with hyperglycemia: Secondary | ICD-10-CM

## 2022-03-14 DIAGNOSIS — E1159 Type 2 diabetes mellitus with other circulatory complications: Secondary | ICD-10-CM

## 2022-03-14 DIAGNOSIS — E782 Mixed hyperlipidemia: Secondary | ICD-10-CM

## 2022-03-14 DIAGNOSIS — E119 Type 2 diabetes mellitus without complications: Secondary | ICD-10-CM

## 2022-03-14 DIAGNOSIS — I10 Essential (primary) hypertension: Secondary | ICD-10-CM

## 2022-03-15 NOTE — Patient Instructions (Signed)
[name] It was a pleasure speaking with you  Below is a summary of your health goals and care plan  Patient Goals/Self-Care Activities take medications as prescribed,  Check glucose daily, document, and provide at future appointments,  Check blood pressure daily, document, and provide at future appointments,  Collaborate with provider on medication access solutions.  You were approved for the medication assistance program for Tresiba, Ozempic and Symbicort (will replace Breo).   I have requested refills for Tresiba and Ozempic Target a minimum of 150 minutes of moderate intensity exercise weekly Focus on medication adherence by filling medications appropriately  Take medications as prescribed Report any questions or concerns to PharmD and/or provider(s)    Follow Up Plan: Telephone follow up appointment with care management team member scheduled for:  3 months; 2 to 3 weeks PCP     If you have any questions or concerns, please feel free to contact me either at the phone number below or with a MyChart message.   Keep up the good work!  Cherre Robins, PharmD Clinical Pharmacist [place] [phone] (direct line)  [phone] (main office number)   Patient verbalizes understanding of instructions and care plan provided today and agrees to view in [place]. Active MyChart status and patient understanding of how to access instructions and care plan via MyChart confirmed with patient.

## 2022-03-15 NOTE — Chronic Care Management (AMB) (Signed)
Pharmacy Note  03/15/2022 Name:  [name] MRN:  [id number] DOB:  [birth year]  Summary: Patient just had surgery - EEA for skull base and encephalocele repair at [facility] on 03/07/2022. Patient has follow up with surgeon 03/13/2022. She reports she is doing well so far.  Diabetes: A1c is now at goal since she restarted Ozempic. She has also lost about 27 lbs since she restarted Ozempic. At last visit Dr Cruzita Lederer stopped glimepiride. Patient is getting Ozempic and Tresiba thru 05/23/2022 from [employer] medication assistance program. Coordinated with medication assistance program to get Ozempic and [name] - patient has 1 box of each Asthma: Still using Symbicort (replace Breo). Approved 11/28/2021 to 06/23/2022 to get Symbicort thru [place] medication assistance program. Patient has  follow up with pulmonology office 04/2022   Follow up planned in November 2023 to restart medication assistance program applications.   Subjective: [name] is an 71 y.o. year old female who is a primary patient of [name], MD.  The CCM team was consulted for assistance with disease management and care coordination needs.    Phone visit with patient and coordination with medication assistance program   for follow up visit in response to provider referral for pharmacy case management and/or care coordination services.   Consent to Services:  The patient was given information about Chronic Care Management services, agreed to services, and gave verbal consent prior to initiation of services.  Please see initial visit note for detailed documentation.   Patient Care Team: [name], MD as PCP - General (Family Medicine) Sueanne Margarita, MD as PCP - Cardiology (Cardiology) Fanny Skates, MD as Consulting Physician (General Surgery) Nicholas Lose, MD as Consulting Physician (Hematology and Oncology) Gery Pray, MD as Consulting Physician (Radiation Oncology) Delice Bison  Charlestine Massed, NP as Nurse Practitioner (Hematology and Oncology) Cherre Robins, [place] (Pharmacist) Luretha Rued, RN as [place] Management  Recent office visits: 01/22/2022 - Int Med (O'[place], NP) CPE. BP at goal - 126/74 after changed to valsartan. Weight was 293 lbs. No med changes noted.  12/11/2021 - Int Med [place]) f/U HTN. BP in office was 154/70. changed losartan to valsartan 336m daily. Weight was 300lbs (down about  11/09/2021 - Int Med ([place]da [place] Recheck HTN and diabetes. No medication changes made  Recent consult visits: 02/14/2022 - Endo (Dr GCruzita Lederer Discontinued glimepiride. Lowered dose of Tresibe to 12 units daily. Changed metformin ER to 20066mat dinnertime (just moved entier dose to pm)  01/14/2022 - Neurosurgery (Dr DaShanon BrowCSF leak recurrence. 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  01/01/2022 - Neurosurgery (Dr EbBradd Burnerevaluation of chronic sinonasal symptoms. She reports that in the interim she has had significant drainage on the right. She also endorses PND. She thinks that her drainage began after fall 6 weeks ago. She reports that her face and teeth hurt. She has been rinsing with a saline solution twice a day.   11/05/2021 - Endo (Dr GhCruzita LedererF/U DM. A1c was elelvated at 9.1%; FBG in morning still > 200. Continued metformin ER 2000 mg at dinnertime; Glimepiride 1 mg before breakfast (and you can use this before a larger meal later in the day, also) and Ozempic 1 mg weekly in a.m.  Started Tresiba U200 14 units daily and increase by 2-4 units every 4 days until sugars in am are <140 10/15/2021 - Cardio (Dr TuRadford PaxF/U CAD, CHF, HTN  and HDL. Repeat ECHO in March 2024. Added Lovaza 2 grams twice a day due to elelvated Tg and elevated ALT 09/03/2021 - Endo (Dr Cruzita Lederer) F/U diabetes. Increased Ozempic to 669m weekly and changed meformin to ER - advised to take 200100mwiht evening  meal. Continue glimepiride 69m74mefore breakfast. Continue Levothyroxine 250 mcg daily. Try to chew the tablet. F/U 2 months.    Hospital visits: 03/07/2022 to [facility]   Objective:  Lab Results  Component Value Date   CREATININE 0.82 01/22/2022   CREATININE 0.77 07/16/2021   CREATININE 0.85 03/20/2021    Lab Results  Component Value Date   HGBA1C 6.3 (A) 02/14/2022   Last diabetic Eye exam:  Lab Results  Component Value Date/Time   HMDIABEYEEXA No Retinopathy 10/24/2021 12:00 AM    Last diabetic Foot exam: No results found for: "HMDIABFOOTEX"      Component Value Date/Time   CHOL 120 01/15/2022 0753   TRIG 227 (H) 01/15/2022 0753   HDL 31 (L) 01/15/2022 0753   CHOLHDL 3.9 01/15/2022 0753   CHOLHDL 3 07/16/2021 1213   VLDL 42.4 (H) 07/16/2021 1213   LDLCALC 52 01/15/2022 0753   LDLCALC 67 06/02/2020 1427   LDLDIRECT 56.0 07/16/2021 1213       Latest Ref Rng & Units 01/15/2022    7:53 AM 10/15/2021    8:22 AM 07/16/2021   12:13 PM  Hepatic Function  Total Protein 6.0 - 8.3 g/dL   7.6   Albumin 3.5 - 5.2 g/dL   4.2   AST 0 - 37 U/L   50   ALT 0 - 32 IU/L 39  35  32   Alk Phosphatase 39 - 117 U/L   77   Total Bilirubin 0.2 - 1.2 mg/dL   0.5     Lab Results  Component Value Date/Time   TSH 1.96 02/14/2022 11:27 AM   TSH 5.18 08/31/2021 10:27 AM   FREET4 1.39 02/14/2022 11:27 AM   FREET4 1.33 03/26/2018 10:24 AM       Latest Ref Rng & Units 07/16/2021   12:13 PM 12/06/2020   11:04 AM 09/05/2020    8:46 AM  CBC  WBC 4.0 - 10.5 K/uL 7.2  8.6  9.4   Hemoglobin 12.0 - 15.0 g/dL 10.6  11.2  11.1   Hematocrit 36.0 - 46.0 % 34.9  36.6  35.4   Platelets 150.0 - 400.0 K/uL 265.0  298.0  288.0     Lab Results  Component Value Date/Time   VD25OH 13.17 (L) 07/16/2021 12:13 PM   VD25OH 17.38 (L) 12/06/2020 11:04 AM    Clinical ASCVD: Yes  The ASCVD Risk score (Arnett DK, et al., 2019) failed to  calculate for the following reasons:   The valid total cholesterol range is 130 to 320 mg/dL    Other: (CHADS2VASc if Afib, PHQ9 if depression, MMRC or CAT for COPD, ACT, DEXA)  Social History   Tobacco Use  Smoking Status Never  Smokeless Tobacco Never   BP Readings from Last 3 Encounters:  02/14/22 132/88  01/22/22 126/74  12/11/21 (!) 154/70   Pulse Readings from Last 3 Encounters:  02/14/22 79  01/22/22 65  12/11/21 67   Wt Readings from Last 3 Encounters:  02/14/22 287 lb 3.2 oz (130.3 kg)  01/22/22 293 lb (132.9 kg)  12/11/21 300 lb (136.1 kg)    Assessment: Review of patient past medical history, allergies, medications, health status, including  review of consultants reports, laboratory and other test data, was performed as part of comprehensive evaluation and provision of chronic care management services.   SDOH:  (Social Determinants of Health) assessments and interventions performed:  SDOH Interventions    Flowsheet Row Care Coordination from 02/26/2022 in [place] Management from 09/21/2021 in [place] at [place] Visit from 07/16/2018 in [place] Office Visit from 05/07/2018 in [place] Office Visit from 04/22/2018 in [place] Office Visit from 04/08/2018 in [place]  SDOH Interventions        Food Insecurity Interventions Intervention Not Indicated -- -- -- -- --  Transportation Interventions Intervention Not Indicated -- -- -- -- --  Utilities Interventions Intervention Not Indicated -- -- -- -- --  Depression Interventions/Treatment  -- -- Currently on Treatment Currently on Treatment Currently on Treatment Counseling  Physical Activity Interventions -- Other (Comments) -- -- -- --        [place]  No Known Allergies  Medications Reviewed Today     Reviewed by Cherre Robins,  RPH-CPP (Pharmacist) on 03/14/22 at 72  Med List Status: <None>   Medication Order Taking? Sig Documenting Provider Last Dose Status Informant  acetaminophen (TYLENOL) 500 MG tablet [id number] Yes Take 1,000 mg by mouth 2 (two) times daily as needed for moderate pain or headache. [provider] Taking Active Self  albuterol (VENTOLIN HFA) 108 (90 Base) MCG/ACT inhaler [id number] Yes Inhale 2 puffs every 4-6 hours as needed - rescue. Deneise Lever, MD Taking Active   anastrozole (ARIMIDEX) 1 MG tablet [id number] Yes Take 1 tablet (1 mg total) by mouth daily. Nicholas Lose, MD Taking Active   aspirin EC 81 MG tablet [id number] Yes Take 81 mg by mouth daily. Swallow whole. [provider] Taking Active   blood glucose meter kit and supplies [id number] Yes Dispense based on patient and insurance preference. Use up to four times daily as directed. (FOR ICD-10 E10.9, E11.9). [name], MD Taking Active   budesonide-formoterol [facility]) 160-4.5 MCG/ACT inhaler [id number] Yes Inhale 2 puffs into the lungs 2 (two) times daily. [provider] Taking Active            Med Note Antony Contras, [place] B   Fri Nov 30, 2021  4:14 PM) Approved to receive from [facility] and me program thru 06/23/2022  busPIRone (BUSPAR) 7.5 MG tablet [id number] Yes Take 1 tablet (7.5 mg total) by mouth 3 (three) times daily. As needed for anxiety Debbrah Alar, NP Taking Active   carvedilol (COREG) 25 MG tablet [id number] Yes Take 1 tablet (25 mg total) by mouth 2 (two) times daily with a meal. Debbrah Alar, NP Taking Active   diclofenac (VOLTAREN) 75 MG EC tablet [id number] Yes Take 1 tablet (75 mg total) by mouth 2 (two) times daily. Debbrah Alar, NP Taking Active   diltiazem (CARDIZEM CD) 120 MG 24 hr capsule [id number] Yes Take 1 capsule (120 mg total) by mouth daily. Debbrah Alar, NP Taking Active   Docusate Calcium (STOOL SOFTENER PO) [id number] Yes Take by mouth daily as needed.  [provider] Taking Active   escitalopram (LEXAPRO) 20 MG tablet [id number] Yes TAKE 1 TABLET EVERY MORNING AND 1/2 EVERY EVENING Debbrah Alar, NP Taking Active   esomeprazole (NEXIUM) 40 MG capsule [id number] Yes Take 1 capsule (40 mg total) by mouth daily. [name], MD Taking Active  ezetimibe (ZETIA) 10 MG tablet [id number] Yes Take 1 tablet (10 mg total) by mouth daily. [name], MD Taking Active   fenofibrate (TRICOR) 145 MG tablet [id number] Yes TAKE ONE TABLET ONCE DAILY [name], MD Taking Active   fluticasone (FLONASE) 50 MCG/ACT nasal spray [id number] Yes Place 2 sprays into both nostrils daily. [name], MD Taking Active   furosemide (LASIX) 20 MG tablet [id number] Yes TAKE ONE TABLET ONCE DAILY [name], MD Taking Active   glucose blood (ACCU-CHEK AVIVA) test strip [id number] Yes Twice daily (please use brand that patient insurance covers) [name], MD Taking Active   Hypromellose (ARTIFICIAL TEARS OP) [id number] Yes Place 1 drop into both eyes daily as needed (dry eyes).  [provider] Taking Active Self  icosapent Ethyl (VASCEPA) 1 g capsule [id number] Yes Take 2 capsules (2 g total) by mouth 2 (two) times daily. Sueanne Margarita, MD Taking Active   insulin degludec (TRESIBA FLEXTOUCH) 200 UNIT/ML FlexTouch Pen [id number] Yes Inject 12-20 Units into the skin daily. Philemon Kingdom, MD Taking Active   Insulin Pen Needle 32G X 4 MM MISC [id number] Yes Use 1x a day Philemon Kingdom, MD Taking Active   levothyroxine (SYNTHROID) 200 MCG tablet [id number] Yes Take 1 tablet (200 mcg total) by mouth daily. Debbrah Alar, NP Taking Active   levothyroxine (SYNTHROID) 50 MCG tablet [id number] Yes Take 1 tablet (50 mcg total) by mouth daily. Colon Branch, MD Taking Active   loratadine (CLARITIN) 10 MG tablet [id number] Yes Take 1 tablet (10 mg total) by mouth daily. [name], MD Taking Active   losartan (COZAAR) 100 MG tablet  [id number] Yes TAKE ONE TABLET BY MOUTH DAILY Debbrah Alar, NP Taking Active   meclizine (ANTIVERT) 25 MG tablet [id number] Yes Take 25 mg by mouth every 6 (six) hours as needed. [provider] Taking Active   metFORMIN (GLUCOPHAGE-XR) 500 MG 24 hr tablet [id number] Yes Take 4 tablets (2,000 mg total) by mouth daily with supper. Philemon Kingdom, MD Taking Active   montelukast (SINGULAIR) 10 MG tablet [id number] Yes TAKE ONE TABLET AT BEDTIME [name], MD Taking Active   rosuvastatin (CRESTOR) 40 MG tablet [id number] Yes Take 1 tablet (40 mg total) by mouth daily. Debbrah Alar, NP Taking Active   Semaglutide, 1 MG/DOSE, (OZEMPIC, 1 MG/DOSE,) 4 MG/3ML SOPN [id number] Yes Inject 1 mg into the skin once a week. Philemon Kingdom, MD Taking Active            Med Note [facility], [place] B   Fri Nov 30, 2021  4:15 PM) Approved to received from [employer] 11/30/21 thru 05/23/2022  Vitamin D, Ergocalciferol, (DRISDOL) 1.25 MG (50000 UNIT) CAPS capsule [id number] Yes Take 1 capsule (50,000 Units total) by mouth every 7 (seven) days. [name], MD Taking Active             Patient Active Problem List   Diagnosis Date Noted   Allergic rhinitis 11/09/2021   Hx of papillary thyroid carcinoma 07/19/2021   Poorly controlled type 2 diabetes mellitus with circulatory disorder ([place]) 07/19/2021   Atypical chest pain 07/16/2021   Urinary incontinence 07/16/2021   Hypothyroidism 03/20/2021   Nausea 12/06/2020   Muscle cramps 12/06/2020   H/O thyroidectomy 08/30/2020   CSF leak from nose 01/13/2020   Uncontrolled type 2 diabetes mellitus with hyperglycemia ([place]) 04/14/2018   Vitamin D deficiency 03/30/2018   Dental infection 03/29/2018   Dysphagia 12/28/2017   Cough 12/28/2017  Peripheral neuropathy 09/22/2017   Lipoma 09/22/2017   Lymphedema 05/22/2017   Asthmatic bronchitis, mild intermittent, uncomplicated 38/03/1750   Breast cancer ([place]) 02/17/2017   Dysuria 02/17/2017    Insomnia 10/08/2016   Dilated aortic root ([place])    Port catheter in place 08/01/2016   Malignant neoplasm of upper-outer quadrant of left female breast ([place]) 05/14/2016   PVC's (premature ventricular contractions) 05/02/2016   Neck mass 04/21/2016   LVH (left ventricular hypertrophy) due to hypertensive disease, with heart failure ([place]) 04/18/2016   Tonsillar hypertrophy 07/07/2015   Back pain 10/02/2014   Obesity 07/11/2014   S/P right TKA 05/10/2014   S/P knee replacement 05/10/2014   Atrial tachycardia, paroxysmal ([place]) 02/22/2014   Type 2 diabetes mellitus with hyperglycemia ([facility]) 12/31/2013   Chronic diastolic CHF (congestive heart failure) ([place]) 11/22/2013   CAD (coronary artery disease), native coronary artery 11/22/2013   Dyspnea on exertion 11/03/2013   Undiagnosed cardiac murmurs 09/05/2013   Preventative health care 09/05/2013   Musculoskeletal pain 11/10/2012   Vasomotor rhinitis 04/05/2012   Obstructive sleep apnea 04/05/2012   Anemia 04/05/2012   Elevated LFTs 04/05/2012   OA (osteoarthritis) of knee 04/05/2012   Hernia 10/13/2010   Hyperlipidemia 09/11/2007   Anxiety and depression 09/11/2007   Essential hypertension 09/11/2007   GERD 09/11/2007   RENAL CALCULUS 09/11/2007   RENAL CYST 09/11/2007   COLONIC POLYPS, HX OF 09/11/2007    Immunization History  Administered Date(s) Administered   Fluad Quad(high Dose 65+) 04/21/2019, 03/20/2021   Influenza Split 03/24/2012, 03/24/2013   Influenza, High Dose Seasonal PF 04/18/2016, 04/21/2017, 03/24/2018, 04/27/2020   Influenza-Unspecified 03/21/2014, 03/15/2015, 03/18/2015   Moderna Covid-19 Vaccine Bivalent Booster 47yr & up 05/07/2021   Moderna SARS-COV2 Booster Vaccination 12/06/2020   Moderna Sars-Covid-2 Vaccination 07/29/2019, 08/27/2019, 04/19/2020   Pneumococcal Conjugate-13 04/18/2016   Pneumococcal Polysaccharide-23 05/22/2017   Tdap 10/02/2021   Zoster Recombinat (Shingrix) 04/21/2019, 06/28/2019    Zoster, Live 04/19/2011    Conditions to be addressed/monitored: CHF, CAD, HTN, HLD, DMII, Anxiety, Depression, Hypothyroidism, and history of breast cancer, history of papillary thyroid cancer with post surgical hypothyroidism; obesity  Care Plan : General Pharmacy (Adult)  Updates made by ECherre Robins RPH-CPP since 03/15/2022 12:00 AM     Problem: Chronic Conditions: type 2 DM; HTN; CHF; CAD; allergies; h/o breast cancer; HDL; post surgical hypothyroidism   Priority: High  Onset Date: 09/17/2021     Goal: Provide education, support and care coordination for medication therapy and chronic conditions   Recent Progress: On track  Priority: High  Note:   Current Barriers:  Unable to independently afford treatment regimen Unable to achieve control of type 2 DM   Unable to maintain control of HTN  Pharmacist Clinical Goal(s):  Over the next 90 days, patient will verbalize ability to afford treatment regimen achieve control of type 2 DM as evidenced by A1c < 7.5% maintain control of hypertension and HDL as evidenced by blood pressure < 140/90 and LDL < 70  through collaboration with PharmD and provider.   Interventions: 1:1 collaboration with BMosie Lukes MD regarding development and update of comprehensive plan of care as evidenced by provider attestation and co-signature Inter-disciplinary care team collaboration (see longitudinal plan of care) Comprehensive medication review performed; medication list updated in electronic medical record  Diabetes / obesity: Not at goal;. LDL goal < 7.5% Seeing Dr GCruzita LedererWeight has decreased 10lbs since [place]  Current treatment: Ozempic 18m- injection 21m56mubcutaneously once weekly Metfomrin  XR 516m - take 4 tablets = 20039mdaily with evening meal.  TrTyler Aas inject 14 units daily; may increase by 2 units every 2 to 4 days.  Previous medications: regular metformin - caused nausea and loose stools (improved with change  to XR), Victoza - abdominal pain Interventions:  Screened for LIS / Extra help. Did not qualify (addressed at previous visit)  Coordinated with [employer] assistance program - requested reevaluation of application faxed [phone] today 11/30/2021 thru 05/23/2022 for [place] Ozempic. Expect first delivery in 10 to 14 business days.  Discussed A1c goals Reviewed home blood glucose readings and reviewed goals  Fasting blood glucose goal (before meals) = 80 to 130 Blood glucose goal after a meal = less than 180  Continue current medication regimen for diabetes.    Hypertension / CHF: Blood pressure Uncontrolled; blood pressure goal < 140/90 CHF controlled; Goal: minimize CHF symptoms and exacerbations  BP Readings from Last 3 Encounters:  02/14/22 132/88  01/22/22 126/74  12/11/21 (!) 154/70  Current treatment: Losartan 10088maily  Carvedilol 12.5mg62mice a day Furosemide 20mg10mly  Diltiazem CD 120mg 53my  Current home readings: not checking at home Patient states she gets nervous prior to appointments. Suggest she could take buspirone prior to appointment to see if they helped with anxiousness. Denies hypotensive/hypertensive symptoms Not checking weight at home. Office weight has decreased recently (due to initiation of Ozempic) Interventions:  Recommended using [employer]-counter benenfit to purchase blood pressure cuff.  Recommended check blood pressure once a day, record and provide at future appointments Discussed limiting intake of sodium to 2300mg o39mss per day.  Discussed signs and symptoms of CHF exacerbation - weight gain, SOB, abdominal fullness, swelling in legs or abdomen, Fatigue and weakness, changes in ability to perform usual activities, persistent cough or wheezing with white or pink blood-tinged mucus, nausea and lack of appetite Recommend check weight daily - report weight gain of more than 3 lbs in 24 hours or 5 lbs in  1 week.  Hyperlipidemia / CAD:  LDL at goal of < 70; triglycerides above goal of < 150 and HDLC low - goal > 40 Lab Results  Component Value Date   CHOL 120 01/15/2022   HDL 31 (L) 01/15/2022   LDLCALC 52 01/15/2022   LDLDIRECT 56.0 07/16/2021   TRIG 227 (H) 01/15/2022   CHOLHDL 3.9 01/15/2022  Current treatment:  Rosuvastatin 40mg da49m Fenofibrate 145mg dai27mEzetimibe 10mg dail69mcosapent Ethyl 1 gram - take 2 capsules twice a day (patient not taking due to cost) Interventions:  Discussed adherence Recommend physical activity daily to lower triglycerides and increase good cholesterol (HDLC). Goal is to work up to at least 150 minutes per week.  Weight loss and dietary changes of limiting high carbohydrate foods can also improve triglycerides and hyperlipidemia.  Anxiety:  Controlled but still gets anxious at medical office visits which causes blood pressure to increase Current treatment:  Buspirone 7.5mg - take12mtablet 3 times a day as needed  Escitalopram 20mg - take87mablet each morning and 0.5 tablet each evening Interventions: Encouraged patient to try buspirone 3 times a day especially prior to medical visits. This might help to keep blood pressure from increasing during visits.  Continue to take escitalopram at current dose   Medication management Pharmacist Clinical Goal(s): Over the next 90 days, patient will work with PharmD and providers to maintain optimal medication adherence [facility]  Pharmacy Interventions Comprehensive medication review performed. Reviewed refill history and assessed adherence Continue current medication management strategy Financial assessment performed . Patient is not taking Breo due to cost. Assessed for LIS / Extra help. Did not qualify. Breo patient assistance program requires $600 out of pocket spend. Applied for Symbicort patient assistance program instead. Approved 11/28/2021 thru 06/23/2022 and first shipment was  sent 11/28/2021. Patient aware and discussed differenced between [facility] and Symbicort inhalers.   Patient Goals/Self-Care Activities Over the next 90 days, patient will:  take medications as prescribed,  Check glucose daily, document, and provide at future appointments,  Check blood pressure daily, document, and provide at future appointments,  Collaborate with provider on medication access solutions - you have been approved to received Symbicort (replaces Breo), [name] and Ozempic from medication assistance program. Symbicort will be shipped to you home and [place] and Ozempic will be shipped to our office.  Target a minimum of 150 minutes of moderate intensity exercise weekly Focus on medication adherence by filling medications appropriately  Take medications as prescribed Report any questions or concerns to PharmD and/or provider(s)  Follow Up Plan: Telephone follow up appointment with care management team member scheduled for:   November 2023          Medication Assistance:  [name] and Ozempic obtained through [employer]  medication assistance program.  Enrollment ends 05/23/2022;  Symbicort obtained through [place] [employer] [place]  medication assistance program.  Enrollment ends 06/23/2022  Patient's preferred pharmacy is:  [facility], [place] - [name] [place] [address] Phone: [phone] Fax: [place], [place] [address] Phone: [phone] Fax: [phone]  Uses pill box? No -   Pt endorses 96% compliance  Follow Up:  Patient agrees to Care Plan and Follow-up.  Plan: Telephone follow up appointment with care management team member scheduled for:  November 2023  Cherre Robins, PharmD Clinical Pharmacist [facility]

## 2022-03-19 NOTE — Telephone Encounter (Signed)
Called pt Lvm let her know we  have her Ozempic ready for pick up.

## 2022-03-21 ENCOUNTER — Other Ambulatory Visit: Payer: Self-pay | Admitting: Internal Medicine

## 2022-03-21 ENCOUNTER — Ambulatory Visit: Payer: Self-pay

## 2022-03-21 DIAGNOSIS — E039 Hypothyroidism, unspecified: Secondary | ICD-10-CM

## 2022-03-21 NOTE — Patient Outreach (Signed)
  Care Coordination   Follow Up Visit Note   03/21/2022 Name: [name] MRN: [id number] DOB: 1951-01-18  [name] is a 71 y.o. year old female who sees [name] Lukes, MD for primary care. I spoke with  [name] by phone today.  What matters to the patients health and wellness today?  [name] reports that this is not a good time. She states she has had her surgery, but is resting at this time. She request a call back to reschedule telephone visit.     Goals Addressed             This Visit's Progress    Pre CFS surgery       Care Coordination Interventions: Care Guide referral for rescheduling of telephone visit       SDOH assessments and interventions completed:  No  Care Coordination Interventions Activated:  Yes  Care Coordination Interventions:  Yes, provided   Follow up plan: Referral made to care guide to schedule visit at another time    Encounter Outcome:  Pt. Request to Call Back   [name] Silversmith, RN, MSN, BSN, CCM Care Coordinator [phone]

## 2022-03-25 ENCOUNTER — Ambulatory Visit: Payer: Self-pay

## 2022-03-25 NOTE — Patient Instructions (Signed)
Visit Information  Thank you for taking time to visit with me today. Please don't hesitate to contact me if I can be of assistance to you.   Following are the goals we discussed today:   Goals Addressed             This Visit's Progress    continue to improve post repair of CSF leak from nose       Care Coordination Interventions: Reviewed upcoming appointment Encouraged patient to contact provider with questions/concerns as needed    Our next appointment is by telephone on 04/24/22 at 11:00 am  Please call the care guide team at [phone] if you need to cancel or reschedule your appointment.   If you are experiencing a Mental Health or [place] or need someone to talk to, please call the Suicide and Crisis Lifeline: 988  Patient verbalizes understanding of instructions and care plan provided today and agrees to view in [place]. Active MyChart status and patient understanding of how to access instructions and care plan via MyChart confirmed with patient.     Thea Silversmith, RN, MSN, BSN, [place] Coordinator [phone]

## 2022-03-25 NOTE — Patient Outreach (Signed)
  Care Coordination   Follow Up Visit Note   03/25/2022 Name: [name] MRN: [id number] DOB: 10-02-1950  [name] is a 71 y.o. year old female who sees [name], MD for primary care. I spoke with  [name] by phone today.  What matters to the patients health and wellness today?  Post repair of CSF leak from nose on 03/07/22 at [facility] Ms. Downs reports it is slow, but states she is improving. She reports at last follow up appointment packing was removed and she states that improved her comfort level some. She has a follow up with Otolaryngologist on 04/03/22 and will see neurosurgery on 04/22/22.    Goals Addressed             This Visit's Progress    continue to improve post repair of CSF leak of the nose       Care Coordination Interventions: Reviewed upcoming appointment Encouraged patient to contact provider with questions/concerns as needed    SDOH assessments and interventions completed:  No  Care Coordination Interventions Activated:  Yes  Care Coordination Interventions:  Yes, provided   Follow up plan: Follow up call scheduled for 04/24/22 11:00 am    Encounter Outcome:  Pt. Visit Completed   Thea Silversmith, RN, MSN, BSN, [place] Coordinator [phone]

## 2022-04-03 DIAGNOSIS — J310 Chronic rhinitis: Secondary | ICD-10-CM | POA: Diagnosis not present

## 2022-04-04 ENCOUNTER — Other Ambulatory Visit: Payer: Self-pay | Admitting: Family Medicine

## 2022-04-11 ENCOUNTER — Other Ambulatory Visit: Payer: Self-pay | Admitting: Family

## 2022-04-11 ENCOUNTER — Other Ambulatory Visit: Payer: Self-pay | Admitting: Family Medicine

## 2022-04-12 ENCOUNTER — Inpatient Hospital Stay: Payer: Medicare Other | Admitting: Hematology and Oncology

## 2022-04-14 ENCOUNTER — Encounter: Payer: Self-pay | Admitting: Internal Medicine

## 2022-04-14 DIAGNOSIS — R195 Other fecal abnormalities: Secondary | ICD-10-CM | POA: Diagnosis not present

## 2022-04-14 DIAGNOSIS — G9600 Cerebrospinal fluid leak, unspecified: Secondary | ICD-10-CM | POA: Diagnosis not present

## 2022-04-14 DIAGNOSIS — K5790 Diverticulosis of intestine, part unspecified, without perforation or abscess without bleeding: Secondary | ICD-10-CM | POA: Diagnosis not present

## 2022-04-14 DIAGNOSIS — M2550 Pain in unspecified joint: Secondary | ICD-10-CM | POA: Diagnosis not present

## 2022-04-14 DIAGNOSIS — R531 Weakness: Secondary | ICD-10-CM | POA: Diagnosis not present

## 2022-04-14 DIAGNOSIS — I959 Hypotension, unspecified: Secondary | ICD-10-CM | POA: Diagnosis not present

## 2022-04-14 DIAGNOSIS — R55 Syncope and collapse: Secondary | ICD-10-CM | POA: Diagnosis not present

## 2022-04-14 DIAGNOSIS — I5032 Chronic diastolic (congestive) heart failure: Secondary | ICD-10-CM | POA: Diagnosis not present

## 2022-04-14 DIAGNOSIS — K921 Melena: Secondary | ICD-10-CM | POA: Diagnosis not present

## 2022-04-14 DIAGNOSIS — R109 Unspecified abdominal pain: Secondary | ICD-10-CM | POA: Diagnosis not present

## 2022-04-14 DIAGNOSIS — I110 Hypertensive heart disease with heart failure: Secondary | ICD-10-CM | POA: Diagnosis not present

## 2022-04-14 DIAGNOSIS — N281 Cyst of kidney, acquired: Secondary | ICD-10-CM | POA: Diagnosis not present

## 2022-04-14 DIAGNOSIS — K219 Gastro-esophageal reflux disease without esophagitis: Secondary | ICD-10-CM | POA: Diagnosis not present

## 2022-04-14 DIAGNOSIS — K922 Gastrointestinal hemorrhage, unspecified: Secondary | ICD-10-CM | POA: Diagnosis not present

## 2022-04-14 DIAGNOSIS — Z85850 Personal history of malignant neoplasm of thyroid: Secondary | ICD-10-CM | POA: Diagnosis not present

## 2022-04-14 DIAGNOSIS — J45909 Unspecified asthma, uncomplicated: Secondary | ICD-10-CM | POA: Diagnosis not present

## 2022-04-14 DIAGNOSIS — I4719 Other supraventricular tachycardia: Secondary | ICD-10-CM | POA: Diagnosis not present

## 2022-04-14 DIAGNOSIS — E785 Hyperlipidemia, unspecified: Secondary | ICD-10-CM | POA: Diagnosis not present

## 2022-04-14 DIAGNOSIS — Z800 Family history of malignant neoplasm of digestive organs: Secondary | ICD-10-CM | POA: Diagnosis not present

## 2022-04-14 DIAGNOSIS — K625 Hemorrhage of anus and rectum: Secondary | ICD-10-CM | POA: Diagnosis not present

## 2022-04-14 DIAGNOSIS — G4733 Obstructive sleep apnea (adult) (pediatric): Secondary | ICD-10-CM | POA: Diagnosis not present

## 2022-04-14 DIAGNOSIS — M170 Bilateral primary osteoarthritis of knee: Secondary | ICD-10-CM | POA: Diagnosis not present

## 2022-04-14 DIAGNOSIS — F32A Depression, unspecified: Secondary | ICD-10-CM | POA: Diagnosis not present

## 2022-04-14 DIAGNOSIS — E1142 Type 2 diabetes mellitus with diabetic polyneuropathy: Secondary | ICD-10-CM | POA: Diagnosis not present

## 2022-04-14 DIAGNOSIS — K648 Other hemorrhoids: Secondary | ICD-10-CM | POA: Diagnosis not present

## 2022-04-14 DIAGNOSIS — E890 Postprocedural hypothyroidism: Secondary | ICD-10-CM | POA: Diagnosis not present

## 2022-04-14 DIAGNOSIS — D500 Iron deficiency anemia secondary to blood loss (chronic): Secondary | ICD-10-CM | POA: Diagnosis not present

## 2022-04-14 DIAGNOSIS — I951 Orthostatic hypotension: Secondary | ICD-10-CM | POA: Diagnosis not present

## 2022-04-14 DIAGNOSIS — K5730 Diverticulosis of large intestine without perforation or abscess without bleeding: Secondary | ICD-10-CM | POA: Diagnosis not present

## 2022-04-14 DIAGNOSIS — K2281 Esophageal polyp: Secondary | ICD-10-CM | POA: Diagnosis not present

## 2022-04-14 DIAGNOSIS — I878 Other specified disorders of veins: Secondary | ICD-10-CM | POA: Diagnosis not present

## 2022-04-15 DIAGNOSIS — Z8679 Personal history of other diseases of the circulatory system: Secondary | ICD-10-CM | POA: Insufficient documentation

## 2022-04-15 DIAGNOSIS — I951 Orthostatic hypotension: Secondary | ICD-10-CM | POA: Insufficient documentation

## 2022-04-15 DIAGNOSIS — K5790 Diverticulosis of intestine, part unspecified, without perforation or abscess without bleeding: Secondary | ICD-10-CM | POA: Diagnosis not present

## 2022-04-15 DIAGNOSIS — K922 Gastrointestinal hemorrhage, unspecified: Secondary | ICD-10-CM | POA: Diagnosis not present

## 2022-04-15 DIAGNOSIS — R55 Syncope and collapse: Secondary | ICD-10-CM | POA: Insufficient documentation

## 2022-04-15 DIAGNOSIS — J302 Other seasonal allergic rhinitis: Secondary | ICD-10-CM | POA: Insufficient documentation

## 2022-04-15 DIAGNOSIS — Z853 Personal history of malignant neoplasm of breast: Secondary | ICD-10-CM | POA: Insufficient documentation

## 2022-04-15 DIAGNOSIS — M1990 Unspecified osteoarthritis, unspecified site: Secondary | ICD-10-CM | POA: Insufficient documentation

## 2022-04-15 DIAGNOSIS — I959 Hypotension, unspecified: Secondary | ICD-10-CM | POA: Diagnosis not present

## 2022-04-16 DIAGNOSIS — R55 Syncope and collapse: Secondary | ICD-10-CM | POA: Diagnosis not present

## 2022-04-16 DIAGNOSIS — K5730 Diverticulosis of large intestine without perforation or abscess without bleeding: Secondary | ICD-10-CM | POA: Diagnosis not present

## 2022-04-16 DIAGNOSIS — K648 Other hemorrhoids: Secondary | ICD-10-CM | POA: Diagnosis not present

## 2022-04-16 DIAGNOSIS — K2971 Gastritis, unspecified, with bleeding: Secondary | ICD-10-CM | POA: Diagnosis not present

## 2022-04-16 DIAGNOSIS — I10 Essential (primary) hypertension: Secondary | ICD-10-CM | POA: Diagnosis not present

## 2022-04-16 DIAGNOSIS — K922 Gastrointestinal hemorrhage, unspecified: Secondary | ICD-10-CM | POA: Diagnosis not present

## 2022-04-16 DIAGNOSIS — K921 Melena: Secondary | ICD-10-CM | POA: Diagnosis not present

## 2022-04-16 DIAGNOSIS — K317 Polyp of stomach and duodenum: Secondary | ICD-10-CM | POA: Diagnosis not present

## 2022-04-16 DIAGNOSIS — K5790 Diverticulosis of intestine, part unspecified, without perforation or abscess without bleeding: Secondary | ICD-10-CM | POA: Diagnosis not present

## 2022-04-16 DIAGNOSIS — K259 Gastric ulcer, unspecified as acute or chronic, without hemorrhage or perforation: Secondary | ICD-10-CM | POA: Diagnosis not present

## 2022-04-16 DIAGNOSIS — D131 Benign neoplasm of stomach: Secondary | ICD-10-CM | POA: Diagnosis not present

## 2022-04-17 DIAGNOSIS — R55 Syncope and collapse: Secondary | ICD-10-CM | POA: Diagnosis not present

## 2022-04-17 DIAGNOSIS — K5790 Diverticulosis of intestine, part unspecified, without perforation or abscess without bleeding: Secondary | ICD-10-CM | POA: Diagnosis not present

## 2022-04-17 DIAGNOSIS — K625 Hemorrhage of anus and rectum: Secondary | ICD-10-CM | POA: Diagnosis not present

## 2022-04-17 DIAGNOSIS — K5730 Diverticulosis of large intestine without perforation or abscess without bleeding: Secondary | ICD-10-CM | POA: Diagnosis not present

## 2022-04-17 DIAGNOSIS — K922 Gastrointestinal hemorrhage, unspecified: Secondary | ICD-10-CM | POA: Diagnosis not present

## 2022-04-17 DIAGNOSIS — D649 Anemia, unspecified: Secondary | ICD-10-CM | POA: Diagnosis not present

## 2022-04-17 DIAGNOSIS — K648 Other hemorrhoids: Secondary | ICD-10-CM | POA: Diagnosis not present

## 2022-04-18 ENCOUNTER — Telehealth: Payer: Self-pay

## 2022-04-18 DIAGNOSIS — D649 Anemia, unspecified: Secondary | ICD-10-CM | POA: Diagnosis not present

## 2022-04-18 DIAGNOSIS — K922 Gastrointestinal hemorrhage, unspecified: Secondary | ICD-10-CM | POA: Diagnosis not present

## 2022-04-18 DIAGNOSIS — R55 Syncope and collapse: Secondary | ICD-10-CM | POA: Diagnosis not present

## 2022-04-18 DIAGNOSIS — K5790 Diverticulosis of intestine, part unspecified, without perforation or abscess without bleeding: Secondary | ICD-10-CM | POA: Diagnosis not present

## 2022-04-18 DIAGNOSIS — K921 Melena: Secondary | ICD-10-CM | POA: Diagnosis not present

## 2022-04-18 DIAGNOSIS — K5730 Diverticulosis of large intestine without perforation or abscess without bleeding: Secondary | ICD-10-CM | POA: Diagnosis not present

## 2022-04-18 NOTE — Telephone Encounter (Addendum)
2nd faxed PCP PAGE(S) for PAP sent on 06/13/22, and 06/27/2022   RECEIVED PT PORTION. FAXED PCP page(s) to office. Please complete and sign and fax [phone] ATTN Lendell Gallick L  Selinda Orion CPhT Rx Patient [place] renewal application to patient home.   Sandre Kitty Rx Patient Advocate

## 2022-04-22 ENCOUNTER — Telehealth: Payer: Self-pay | Admitting: Family Medicine

## 2022-04-22 ENCOUNTER — Other Ambulatory Visit: Payer: Self-pay

## 2022-04-22 NOTE — Telephone Encounter (Signed)
Patient states she was admitted up in [facility] due to low hemoglobin. She set up a HFU with [name] on  11/06 but would like to get her hemoglobin checked before then. Please advise.

## 2022-04-23 ENCOUNTER — Other Ambulatory Visit: Payer: Self-pay

## 2022-04-23 DIAGNOSIS — D649 Anemia, unspecified: Secondary | ICD-10-CM

## 2022-04-23 NOTE — Telephone Encounter (Signed)
Called pt appt was made for labs

## 2022-04-24 ENCOUNTER — Ambulatory Visit: Payer: Self-pay

## 2022-04-24 ENCOUNTER — Encounter (HOSPITAL_BASED_OUTPATIENT_CLINIC_OR_DEPARTMENT_OTHER): Payer: Self-pay | Admitting: Emergency Medicine

## 2022-04-24 ENCOUNTER — Telehealth: Payer: Self-pay

## 2022-04-24 ENCOUNTER — Other Ambulatory Visit (INDEPENDENT_AMBULATORY_CARE_PROVIDER_SITE_OTHER): Payer: Medicare Other

## 2022-04-24 ENCOUNTER — Other Ambulatory Visit: Payer: Self-pay

## 2022-04-24 ENCOUNTER — Emergency Department (HOSPITAL_BASED_OUTPATIENT_CLINIC_OR_DEPARTMENT_OTHER)
Admission: EM | Admit: 2022-04-24 | Discharge: 2022-04-24 | Disposition: A | Discharge status: Home / Self Care | Payer: Medicare Other | Attending: Emergency Medicine | Admitting: Emergency Medicine

## 2022-04-24 DIAGNOSIS — R5383 Other fatigue: Secondary | ICD-10-CM | POA: Insufficient documentation

## 2022-04-24 DIAGNOSIS — I110 Hypertensive heart disease with heart failure: Secondary | ICD-10-CM | POA: Insufficient documentation

## 2022-04-24 DIAGNOSIS — Z794 Long term (current) use of insulin: Secondary | ICD-10-CM | POA: Diagnosis not present

## 2022-04-24 DIAGNOSIS — Z96653 Presence of artificial knee joint, bilateral: Secondary | ICD-10-CM | POA: Diagnosis not present

## 2022-04-24 DIAGNOSIS — Z9012 Acquired absence of left breast and nipple: Secondary | ICD-10-CM | POA: Diagnosis not present

## 2022-04-24 DIAGNOSIS — Z7984 Long term (current) use of oral hypoglycemic drugs: Secondary | ICD-10-CM | POA: Diagnosis not present

## 2022-04-24 DIAGNOSIS — Z853 Personal history of malignant neoplasm of breast: Secondary | ICD-10-CM | POA: Insufficient documentation

## 2022-04-24 DIAGNOSIS — D649 Anemia, unspecified: Secondary | ICD-10-CM | POA: Insufficient documentation

## 2022-04-24 DIAGNOSIS — I5032 Chronic diastolic (congestive) heart failure: Secondary | ICD-10-CM | POA: Diagnosis not present

## 2022-04-24 DIAGNOSIS — I2510 Atherosclerotic heart disease of native coronary artery without angina pectoris: Secondary | ICD-10-CM | POA: Insufficient documentation

## 2022-04-24 DIAGNOSIS — Z79899 Other long term (current) drug therapy: Secondary | ICD-10-CM | POA: Insufficient documentation

## 2022-04-24 DIAGNOSIS — R42 Dizziness and giddiness: Secondary | ICD-10-CM | POA: Diagnosis not present

## 2022-04-24 DIAGNOSIS — Z7982 Long term (current) use of aspirin: Secondary | ICD-10-CM | POA: Diagnosis not present

## 2022-04-24 DIAGNOSIS — R799 Abnormal finding of blood chemistry, unspecified: Secondary | ICD-10-CM | POA: Diagnosis present

## 2022-04-24 DIAGNOSIS — J45909 Unspecified asthma, uncomplicated: Secondary | ICD-10-CM | POA: Diagnosis not present

## 2022-04-24 LAB — CBC WITH DIFFERENTIAL/PLATELET
Abs Immature Granulocytes: 0.02 10*3/uL (ref 0.00–0.07)
Basophils Absolute: 0 10*3/uL (ref 0.0–0.1)
Basophils Absolute: 0 10*3/uL (ref 0.0–0.1)
Basophils Relative: 0.3 % (ref 0.0–3.0)
Basophils Relative: 0 %
Eosinophils Absolute: 0.1 10*3/uL (ref 0.0–0.7)
Eosinophils Absolute: 0.1 10*3/uL (ref 0.0–0.5)
Eosinophils Relative: 1.1 % (ref 0.0–5.0)
Eosinophils Relative: 1 %
HCT: 25.3 % — ABNORMAL LOW (ref 36.0–46.0)
HCT: 25.3 % — ABNORMAL LOW (ref 36.0–46.0)
Hemoglobin: 7.9 g/dL — CL (ref 12.0–15.0)
Hemoglobin: 7.7 g/dL — ABNORMAL LOW (ref 12.0–15.0)
Immature Granulocytes: 0 %
Lymphocytes Relative: 20.8 % (ref 12.0–46.0)
Lymphocytes Relative: 25 %
Lymphs Abs: 1.3 10*3/uL (ref 0.7–4.0)
Lymphs Abs: 1.9 10*3/uL (ref 0.7–4.0)
MCH: 26.5 pg (ref 26.0–34.0)
MCHC: 31.1 g/dL (ref 30.0–36.0)
MCHC: 30.4 g/dL (ref 30.0–36.0)
MCV: 84.5 fl (ref 78.0–100.0)
MCV: 86.9 fL (ref 80.0–100.0)
Monocytes Absolute: 0.4 10*3/uL (ref 0.1–1.0)
Monocytes Absolute: 0.5 10*3/uL (ref 0.1–1.0)
Monocytes Relative: 6.4 % (ref 3.0–12.0)
Monocytes Relative: 6 %
Neutro Abs: 4.5 10*3/uL (ref 1.4–7.7)
Neutro Abs: 5.4 10*3/uL (ref 1.7–7.7)
Neutrophils Relative %: 71.4 % (ref 43.0–77.0)
Neutrophils Relative %: 68 %
Platelets: 352 10*3/uL (ref 150.0–400.0)
Platelets: 351 10*3/uL (ref 150–400)
RBC: 2.99 Mil/uL — ABNORMAL LOW (ref 3.87–5.11)
RBC: 2.91 MIL/uL — ABNORMAL LOW (ref 3.87–5.11)
RDW: 15.6 % — ABNORMAL HIGH (ref 11.5–15.5)
RDW: 14.7 % (ref 11.5–15.5)
WBC: 6.3 10*3/uL (ref 4.0–10.5)
WBC: 7.8 10*3/uL (ref 4.0–10.5)
nRBC: 0 % (ref 0.0–0.2)

## 2022-04-24 LAB — COMPREHENSIVE METABOLIC PANEL
ALT: 15 U/L (ref 0–44)
AST: 24 U/L (ref 15–41)
Albumin: 3.5 g/dL (ref 3.5–5.0)
Alkaline Phosphatase: 58 U/L (ref 38–126)
Anion gap: 9 (ref 5–15)
BUN: 14 mg/dL (ref 8–23)
CO2: 27 mmol/L (ref 22–32)
Calcium: 9.2 mg/dL (ref 8.9–10.3)
Chloride: 102 mmol/L (ref 98–111)
Creatinine, Ser: 0.65 mg/dL (ref 0.44–1.00)
GFR, Estimated: >60 mL/min (ref >60)
Glucose, Bld: 110 mg/dL — ABNORMAL HIGH (ref 70–99)
Potassium: 4.1 mmol/L (ref 3.5–5.1)
Sodium: 138 mmol/L (ref 135–145)
Total Bilirubin: 0.3 mg/dL (ref 0.3–1.2)
Total Protein: 6.6 g/dL (ref 6.5–8.1)

## 2022-04-24 LAB — TROPONIN I (HIGH SENSITIVITY)
Troponin I (High Sensitivity): 10 ng/L (ref <18)
Troponin I (High Sensitivity): 11 ng/L (ref <18)

## 2022-04-24 LAB — RETICULOCYTES
Immature Retic Fract: 23.7 % — ABNORMAL HIGH (ref 2.3–15.9)
RBC.: 2.89 MIL/uL — ABNORMAL LOW (ref 3.87–5.11)
Retic Count, Absolute: 65 10*3/uL (ref 19.0–186.0)
Retic Ct Pct: 2.3 % (ref 0.4–3.1)

## 2022-04-24 LAB — FERRITIN: Ferritin: 3 ng/mL — ABNORMAL LOW (ref 11–307)

## 2022-04-24 LAB — IRON AND TIBC
Iron: 17 ug/dL — ABNORMAL LOW (ref 28–170)
Saturation Ratios: 4 % — ABNORMAL LOW (ref 10.4–31.8)
TIBC: 448 ug/dL (ref 250–450)
UIBC: 431 ug/dL

## 2022-04-24 LAB — PROTIME-INR
INR: 1 (ref 0.8–1.2)
Prothrombin Time: 12.8 seconds (ref 11.4–15.2)

## 2022-04-24 LAB — VITAMIN B12: Vitamin B-12: 219 pg/mL (ref 180–914)

## 2022-04-24 LAB — OCCULT BLOOD X 1 CARD TO LAB, STOOL: Fecal Occult Bld: NEGATIVE

## 2022-04-24 LAB — LACTATE DEHYDROGENASE: LDH: 125 U/L (ref 98–192)

## 2022-04-24 LAB — FOLATE: Folate: 13.5 ng/mL (ref >5.9)

## 2022-04-24 MED ORDER — PANTOPRAZOLE SODIUM 40 MG IV SOLR
40.0000 mg | Freq: Once | INTRAVENOUS | Status: AC
  Administered 2022-04-24: 40 mg via INTRAVENOUS
  Filled 2022-04-24: qty 10

## 2022-04-24 MED ORDER — SODIUM CHLORIDE 0.9 % IV BOLUS
1000.0000 mL | Freq: Once | INTRAVENOUS | Status: AC
  Administered 2022-04-24: 1000 mL via INTRAVENOUS

## 2022-04-24 NOTE — Telephone Encounter (Signed)
Pretty stable from 10/26 of 8.4. If she isn't having symptomatic anemia (light headed, etc), then we can wait on f/u appt. Ty.

## 2022-04-24 NOTE — ED Provider Notes (Signed)
[facility] EMERGENCY DEPT Provider Note   CSN: [id number] Arrival date & time: 04/24/22  1545     History  Chief Complaint  Patient presents with   Abnormal Lab    [name] is a 71 y.o. female.  Patient as above with significant medical history as below, including anemia, anxiety, PAT, diastolic HF, prior GIB who presents to the ED with complaint of low blood count. Pt was recently hospitalized in [place] 2/2 presumed GIB. Pt reports she had upper endoscopy and c-scopy at that time which was negative for acute bleeding source. Did not receive blood transfusion. ([facility]). Bleed presumed 2/2 diverticulosis. Pt to ED today after she received outpatient blood work that showed a reduced hgb. She was also light headed but did not have LOC. She has been fatigued since she was discharged from hospital in [place]. No thinners. She is on PPI with good compliance. Has not noticed any melena or brbpr, no hematemesis reported. No CP dib skin changes   Per OSH hgb 8 > 7.2     Past Medical History:  Diagnosis Date   Anemia 04/05/2012   Anxiety    Anxiety state 09/11/2007   Qualifier: Diagnosis of  By: Sherren Mocha RN, Dorian Pod     Asthma    Atrial tachycardia, paroxysmal    Back pain 10/02/2014   Breast cancer ([place]) 02/17/2017   CAD (coronary artery disease), native coronary artery    remote Cath with nonobstructive ASCAD with 20% mid LAD, 20% prox left circ and 20% ostial RCA   Chicken pox as a child   Chronic diastolic CHF (congestive heart failure), NYHA class 1 ([place])    Complication of anesthesia    pt states feels different in her body after anesthesia when waking up and also experiences a smell of burnt plastic for approx a wk    Constipation    Depression    Diabetes ([place])    Dilated aortic root ([place])    46m by echo 08/2020   Dysuria 02/17/2017   Elevated LFTs 04/05/2012   GERD (gastroesophageal reflux disease)    Goiter    Heart murmur    hx of one at  birth    History of kidney stones    History of radiation therapy 10/31/16-12/17/16   left breast 45 Gy in 25 fractions, left breast boost 16 Gy in 8 fractions   Hx of colonic polyps    Hyperlipidemia    Hypertension    Insomnia 10/08/2016   Joint pain    Malignant neoplasm of upper-outer quadrant of left female breast ([place] 05/14/2016   not with patient   Measles as a child   Mumps as a child   OA (osteoarthritis) of knee 04/05/2012   Obesity 07/11/2014   PONV (postoperative nausea and vomiting)    Preventative health care 09/05/2013   PVC's (premature ventricular contractions) 05/02/2016   Shortness of breath dyspnea    walking distances / climbing stairs   Sleep apnea 04/05/2012   Tinea corporis 02/23/2013   Type 2 diabetes mellitus with hyperglycemia ([place] 12/31/2013   Vasomotor rhinitis 04/05/2012   Ventral hernia    Wheezing     Past Surgical History:  Procedure Laterality Date   BREAST LUMPECTOMY Left 2017   BREAST LUMPECTOMY WITH RADIOACTIVE SEED AND SENTINEL LYMPH NODE BIOPSY Left 06/21/2016   Procedure: LEFT BREAST LUMPECTOMY WITH RADIOACTIVE SEED AND SENTINEL LYMPH NODE BIOPSY, INJECT BLUE DYE LEFT BREAST;  Surgeon: HFanny Skates MD;  [facility];  Service: General;  Laterality: Left;   CARDIAC CATHETERIZATION     normal coroary arteries per patient   CARDIOVASCULAR STRESS TEST     10/12/2013   CESAREAN SECTION     X 3   CHOLECYSTECTOMY     COLONOSCOPY WITH PROPOFOL N/A 03/28/2015   Procedure: COLONOSCOPY WITH PROPOFOL;  Surgeon: Juanita Craver, MD;  Location: [facility] ENDOSCOPY;  Service: Endoscopy;  Laterality: N/A;   HERNIA REPAIR  02/08/2011   ventral hernia   JOINT REPLACEMENT     bilateral   KNEE ARTHROSCOPY  05/2010   bilateral   LEFT AND RIGHT HEART CATHETERIZATION WITH CORONARY ANGIOGRAM N/A 11/08/2013   Procedure: LEFT AND RIGHT HEART CATHETERIZATION WITH CORONARY ANGIOGRAM;  Surgeon: [name], MD;  Location[facility] C[facility] LAB;  Service:  Cardiovascular;  Laterality: N/A;   MENISCUS REPAIR  2009   MOUTH SURGERY     teeth implants   PORT-A-CATH REMOVAL N/A 01/24/2017   Procedure: REMOVAL PORT-A-CATH;  Surgeon: Fanny Skates, MD;  Location: [facility];  Service: General;  Laterality: N/A;   PORTACATH PLACEMENT Right 07/16/2016   Procedure: INSERTION PORT-A-CATH RIGHT INTERNAL JUGULAR WITH ULTRASOUND;  Surgeon: Fanny Skates, MD;  Location: [facility];  Service: General;  Laterality: Right;   REPAIR DURAL / CSF LEAK  2021   performed at [facility] in [place]  2011   left   TOTAL KNEE ARTHROPLASTY Right 05/10/2014   Procedure: RIGHT TOTAL KNEE ARTHROPLASTY;  Surgeon: Mauri Pole, MD;  Location: [facility] ORS;  Service: Orthopedics;  Laterality: Right;   TOTAL THYROIDECTOMY Bilateral 2022   TUBAL LIGATION     WISDOM TOOTH EXTRACTION  2000     The history is provided by the patient and a relative. No language interpreter was used.  Abnormal Lab      Home Medications Prior to Admission medications   Medication Sig Start Date End Date Taking? Authorizing Provider  acetaminophen (TYLENOL) 500 MG tablet Take 1,000 mg by mouth 2 (two) times daily as needed for moderate pain or headache.    [provider]  albuterol (VENTOLIN HFA) 108 (90 Base) MCG/ACT inhaler Inhale 2 puffs every 4-6 hours as needed - rescue. 06/19/20   Deneise Lever, MD  anastrozole (ARIMIDEX) 1 MG tablet Take 1 tablet (1 mg total) by mouth daily. 04/12/21   Nicholas Lose, MD  aspirin EC 81 MG tablet Take 81 mg by mouth daily. Swallow whole.    [provider]  blood glucose meter kit and supplies Dispense based on patient and insurance preference. Use up to four times daily as directed. (FOR ICD-10 E10.9, E11.9). 08/08/21   [name], MD  budesonide-formoterol [facility]) 160-4.5 MCG/ACT inhaler Inhale 2 puffs into the lungs 2 (two) times daily.    [provider]  busPIRone (BUSPAR) 7.5 MG tablet  Take 1 tablet (7.5 mg total) by mouth 3 (three) times daily. As needed for anxiety 11/09/21   Debbrah Alar, NP  carvedilol (COREG) 25 MG tablet Take 1 tablet (25 mg total) by mouth 2 (two) times daily with a meal. 11/09/21   Debbrah Alar, NP  diclofenac (VOLTAREN) 75 MG EC tablet TAKE ONE TABLET TWICE DAILY 04/11/22   [name], MD  diltiazem (CARDIZEM CD) 120 MG 24 hr capsule Take 1 capsule (120 mg total) by mouth daily. 11/09/21   Debbrah Alar, NP  Docusate Calcium (STOOL SOFTENER PO) Take by mouth daily as needed.  [provider]  escitalopram (LEXAPRO) 20 MG tablet TAKE 1 TABLET EVERY MORNING AND 1/2 EVERY EVENING 11/09/21   Debbrah Alar, NP  esomeprazole (NEXIUM) 40 MG capsule Take 1 capsule (40 mg total) by mouth daily. 07/16/21   [name], MD  ezetimibe (ZETIA) 10 MG tablet Take 1 tablet (10 mg total) by mouth daily. 07/16/21   [name], MD  fenofibrate (TRICOR) 145 MG tablet TAKE ONE TABLET ONCE DAILY 04/11/22   [name], MD  fluticasone [facility]) 50 MCG/ACT nasal spray Place 2 sprays into both nostrils daily. 12/22/17   [name], MD  furosemide (LASIX) 20 MG tablet TAKE ONE TABLET ONCE DAILY 02/28/22   [name], MD  glucose blood (ACCU-CHEK AVIVA) test strip Twice daily (please use brand that patient insurance covers) 08/07/21   [name], MD  Hypromellose (ARTIFICIAL TEARS OP) Place 1 drop into both eyes daily as needed (dry eyes).     [provider]  icosapent Ethyl (VASCEPA) 1 g capsule Take 2 capsules (2 g total) by mouth 2 (two) times daily. 10/16/21   Sueanne Margarita, MD  insulin degludec (TRESIBA FLEXTOUCH) 200 UNIT/ML FlexTouch Pen Inject 12-20 Units into the skin daily. 02/14/22   Philemon Kingdom, MD  Insulin Pen Needle 32G X 4 MM MISC Use 1x a day 11/05/21   Philemon Kingdom, MD  levothyroxine (SYNTHROID) 200 MCG tablet Take 1 tablet (200 mcg total) by mouth daily. 11/09/21   Debbrah Alar,  NP  levothyroxine (SYNTHROID) 50 MCG tablet Take 1 tablet (50 mcg total) by mouth daily before breakfast. 03/21/22   [name], MD  loratadine (CLARITIN) 10 MG tablet Take 1 tablet (10 mg total) by mouth daily. 12/22/17   [name], MD  losartan (COZAAR) 100 MG tablet TAKE ONE TABLET BY MOUTH DAILY 02/17/22   Debbrah Alar, NP  meclizine (ANTIVERT) 25 MG tablet Take 25 mg by mouth every 6 (six) hours as needed. 12/28/19   [provider]  metFORMIN (GLUCOPHAGE-XR) 500 MG 24 hr tablet Take 4 tablets (2,000 mg total) by mouth daily with supper. 09/03/21   Philemon Kingdom, MD  montelukast (SINGULAIR) 10 MG tablet TAKE ONE TABLET AT BEDTIME 04/04/22   [name], MD  rosuvastatin (CRESTOR) 40 MG tablet Take 1 tablet (40 mg total) by mouth daily. 11/09/21   Debbrah Alar, NP  Semaglutide, 1 MG/DOSE, (OZEMPIC, 1 MG/DOSE,) 4 MG/3ML SOPN Inject 1 mg into the skin once a week. 09/03/21   Philemon Kingdom, MD  Vitamin D, Ergocalciferol, (DRISDOL) 1.25 MG (50000 UNIT) CAPS capsule Take 1 capsule (50,000 Units total) by mouth every 7 (seven) days. 07/18/21   [name], MD      Allergies    Patient has no known allergies.    Review of Systems   Review of Systems  Constitutional:  Positive for fatigue. Negative for activity change and fever.  HENT:  Negative for facial swelling and trouble swallowing.   Eyes:  Negative for discharge and redness.  Respiratory:  Negative for cough and shortness of breath.   Cardiovascular:  Negative for chest pain and palpitations.  Gastrointestinal:  Negative for abdominal pain and nausea.  Genitourinary:  Negative for dysuria and flank pain.  Musculoskeletal:  Negative for back pain and gait problem.  Skin:  Negative for pallor and rash.  Neurological:  Positive for light-headedness. Negative for syncope and headaches.    Physical Exam Updated Vital Signs BP 134/76  Pulse 69   Temp 98.5 F (36.9 C) (Oral)   Resp (!) 24   Ht  _0  (1.676 m)   Wt 122.5 kg   SpO2 94%   BMI 43.58 kg/m  Physical Exam Vitals and nursing note reviewed. Exam conducted with a chaperone present.  Constitutional:      General: She is not in acute distress.    Appearance: Normal appearance.  HENT:     Head: Normocephalic and atraumatic.     Right Ear: External ear normal.     Left Ear: External ear normal.     Nose: Nose normal.     Mouth/Throat:     Mouth: Mucous membranes are moist.  Eyes:     General: No scleral icterus.       Right eye: No discharge.        Left eye: No discharge.  Cardiovascular:     Rate and Rhythm: Normal rate and regular rhythm.     Pulses: Normal pulses.     Heart sounds: Normal heart sounds.  Pulmonary:     Effort: Pulmonary effort is normal. No respiratory distress.     Breath sounds: Normal breath sounds.  Abdominal:     General: Abdomen is flat.     Tenderness: There is no abdominal tenderness.  Genitourinary:    Comments: No frank bleeding, no melena, no obv external hemorrhoid or fissure. FOBT - Musculoskeletal:        General: Normal range of motion.     Cervical back: Normal range of motion.     Right lower leg: No edema.     Left lower leg: No edema.  Skin:    General: Skin is warm and dry.     Capillary Refill: Capillary refill takes less than 2 seconds.  Neurological:     Mental Status: She is alert.  Psychiatric:        Mood and Affect: Mood normal.        Behavior: Behavior normal.     ED Results / Procedures / Treatments   Labs (all labs ordered are listed, but only abnormal results are displayed) Labs Reviewed  COMPREHENSIVE METABOLIC PANEL - Abnormal; Notable for the following components:      Result Value   Glucose, Bld 110 (*)    All other components within normal limits  CBC WITH DIFFERENTIAL/PLATELET - Abnormal; Notable for the following components:   RBC 2.91 (*)    Hemoglobin 7.7 (*)    HCT 25.3 (*)    All other components within normal limits   RETICULOCYTES - Abnormal; Notable for the following components:   RBC. 2.89 (*)    Immature Retic Fract 23.7 (*)    All other components within normal limits  IRON AND TIBC - Abnormal; Notable for the following components:   Iron 17 (*)    Saturation Ratios 4 (*)    All other components within normal limits  FERRITIN - Abnormal; Notable for the following components:   Ferritin 3 (*)    All other components within normal limits  PROTIME-INR  FOLATE  LACTATE DEHYDROGENASE  VITAMIN B12  OCCULT BLOOD X 1 CARD TO LAB, STOOL  TROPONIN I (HIGH SENSITIVITY)  TROPONIN I (HIGH SENSITIVITY)    EKG EKG Interpretation  Date/Time:  Wednesday April 24 2022 16:18:38 EDT Ventricular Rate:  69 PR Interval:  211 QRS Duration: 121 QT Interval:  450 QTC Calculation: 483 R Axis:   -72 Text Interpretation: Sinus rhythm Nonspecific IVCD with  LAD Probable anteroseptal infarct, old no stemi similar to prior Confirmed by Wynona Dove ([id number]) on 04/24/2022 7:09:02 PM  Radiology No results found.  Procedures Procedures    Medications Ordered in ED Medications  sodium chloride 0.9 % bolus 1,000 mL (0 mLs Intravenous Stopped 04/24/22 1943)  pantoprazole (PROTONIX) injection 40 mg (40 mg Intravenous Given 04/24/22 1916)    ED Course/ Medical Decision Making/ A&P                           Medical Decision Making Amount and/or Complexity of Data Reviewed Labs: ordered.  Risk Prescription drug management.   This patient presents to the ED with chief complaint(s) of fatigue, abn lab with pertinent past medical history of anemia, gib which further complicates the presenting complaint. The complaint involves an extensive differential diagnosis and also carries with it a high risk of complications and morbidity.    The differential diagnosis includes but not limited to acs, dehydration, blood loss, electrolyte derangement, metabolic, infectious, etc. Serious etiologies were considered.   The  initial plan is to screening labs ivf hemoccult reassess   Additional history obtained: Additional history obtained from family Records reviewed Care Everywhere/External Records and prior labs  Independent labs interpretation:  The following labs were independently interpreted:  Hgb 7.7, was 7.9 earlier; as low as 7.2 during prior admission.  Anemia panel sent Trop neg x2 Fobt neg Cmp stable  Independent visualization of imaging: na  Cardiac monitoring was reviewed and interpreted by myself which shows NSR, EKG w/o stemi  Treatment and Reassessment: IVF >> greatly improved, ambulatory w/ steady gait, light headed sensation resolved  Consultation: - Consulted or discussed management/test interpretation w/ external professional: na  Consideration for admission or further workup: Admission was considered    Pt with fatigue, appears to be more so a chronic anemia from her prior GIB, does not require PRBC today.  Unlikely 2/2 acs, infectious process. Favor light headed sensation likely a/w mild dehydration. Symptoms have improved after IVF. FOBT neg. Advised she f/u with pcp in next few days for rpt CBC and to also f/u with GI. Pt agreeable. Return precautions were discussed at length  The patient improved significantly and was discharged in stable condition. Detailed discussions were had with the patient regarding current findings, and need for close f/u with PCP or on call doctor. The patient has been instructed to return immediately if the symptoms worsen in any way for re-evaluation. Patient verbalized understanding and is in agreement with current care plan. All questions answered prior to discharge.    Social Determinants of health: Social History   Tobacco Use   Smoking status: Never   Smokeless tobacco: Never  Vaping Use   Vaping Use: Never used  Substance Use Topics   Alcohol use: Not Currently    Comment: rarely   Drug use: No            Final Clinical  Impression(s) / ED Diagnoses Final diagnoses:  Anemia, unspecified type    Rx / DC Orders ED Discharge Orders     None         Jeanell Sparrow, DO 04/26/22 1308

## 2022-04-24 NOTE — Telephone Encounter (Signed)
CRITICAL VALUE STICKER  CRITICAL VALUE: Hemoglobin 7.9   RECEIVER (on-site recipient of call): Manuela Schwartz, RMA  DATE & TIME NOTIFIED: 04/24/2022 at 12:58 pm   MESSENGER (representative from lab): Delorise Jackson   MD NOTIFIED: Riki Sheer, DO; Penni Homans, MD   TIME OF NOTIFICATION:1:03 pm  RESPONSE:

## 2022-04-24 NOTE — ED Notes (Signed)
Pt ambulated indept to the bathroom, meds started, IV adjusted and dressing changed for reenforcement, pt denies any needs at this time.

## 2022-04-24 NOTE — Telephone Encounter (Signed)
Called pt was advised she need to go to ER  Per DOD Dr.Wendling to seen.

## 2022-04-24 NOTE — ED Notes (Signed)
Pt ambulated to the bathroom with her cane and steady gait.

## 2022-04-24 NOTE — Discharge Instructions (Addendum)
Please follow-up with your primary care doctor on Monday for repeat hemoglobin check Please follow-up with gastroenterology in the next 2 weeks  It was a pleasure caring for you today in the emergency department.  Please return to the emergency department for any worsening or worrisome symptoms. Especially if you feel as though you are going to pass out, feel  very light headed, have difficulty breathing, notice any bleeding in your stool.

## 2022-04-24 NOTE — ED Notes (Signed)
RN provided AVS using Teachback Method. Patient verbalizes understanding of Discharge Instructions. Opportunity for Questioning and Answers were provided by RN. Patient Discharged from ED ambulatory to home with cane with family.

## 2022-04-24 NOTE — Telephone Encounter (Signed)
Called pt to check to see how she was feeling.  Pt stated she was feeling really tried and weak. Spoke with DOD  Dr.Wendling if pt feeling worse from when last seen to go ER to be evaluate.

## 2022-04-24 NOTE — ED Triage Notes (Signed)
Recent GI bleed, d/c from hospital last week. Recheck hbg 7.9. patient reports hbg 8.4 at d/c Reports feeling light headed and fatigued when getting up

## 2022-04-24 NOTE — Patient Outreach (Signed)
  Care Coordination   Follow Up Visit Note   04/24/2022 Name: Yusra Ravert MRN: [id number] DOB: 11-23-1950  Avanelle Pixley is a 71 y.o. year old female who sees [name], MD for primary care. I spoke with  [name] by phone today.  What matters to the patients health and wellness today?  Ms. Gurevich reports she was visiting her daughter in [place] when she had episode of GI bleeding she reports she was hospitalized 04/02/21-10/26 follow up lab work completed today. She denies any further bleeding, but reports she continues to be weak. Follow up visit with PCP scheduled for 04/29/22. Other scheduled appointments: 04/30/22 clinical pharmacist; 05/06/22 neurosurgery; 05/07/22 otolaryngologist; 05/10/22 pulmonary. She reports continues to improve post repair of CSF leak.   Goals Addressed             This Visit's Progress    continue to improve post repair of CSF leak from nose       Care Coordination Interventions: Reviewed upcoming appointments and encouraged to continue to attend as scheduled Encouraged patient to contact provider with questions/concerns as needed     Health management       Care Coordination Interventions: hospitalization 10/22-10/26 for lower GI Bleed Discussed recent hospitalization Reviewed upcoming/scheduled appointments Encouraged to seek medication attention if dizziness, syncope, more bleeding episodes, worsening weakness/condition       SDOH assessments and interventions completed:  Yes  SDOH Interventions Today    Flowsheet Row Most Recent Value  SDOH Interventions   Transportation Interventions Intervention Not Indicated     Care Coordination Interventions Activated:  Yes  Care Coordination Interventions:  Yes, provided   Follow up plan: Follow up call scheduled for 05/24/22    Encounter Outcome:  Pt. Visit Completed   Thea Silversmith, RN, MSN, BSN, [place] Coordinator [phone]

## 2022-04-25 ENCOUNTER — Other Ambulatory Visit: Payer: Self-pay

## 2022-04-25 LAB — IRON,TIBC AND FERRITIN PANEL
%SAT: 7 % (calc) — ABNORMAL LOW (ref 16–45)
Ferritin: 4 ng/mL — ABNORMAL LOW (ref 16–288)
Iron: 33 ug/dL — ABNORMAL LOW (ref 45–160)
TIBC: 455 mcg/dL (calc) — ABNORMAL HIGH (ref 250–450)

## 2022-04-26 ENCOUNTER — Telehealth: Payer: Self-pay

## 2022-04-26 ENCOUNTER — Encounter: Payer: Self-pay | Admitting: Family Medicine

## 2022-04-26 ENCOUNTER — Other Ambulatory Visit: Payer: Self-pay

## 2022-04-26 MED ORDER — HEMOCYTE-F 324-1 MG PO TABS: ORAL_TABLET | ORAL | 3 refills | Status: DC

## 2022-04-26 NOTE — Telephone Encounter (Signed)
Called pt was advised if worsen over weekend with any blood in stool  Are any dark stool to go back to  ER. Pt stated understand. Meds sent.

## 2022-04-29 ENCOUNTER — Ambulatory Visit (INDEPENDENT_AMBULATORY_CARE_PROVIDER_SITE_OTHER): Payer: Medicare Other | Admitting: Family

## 2022-04-29 ENCOUNTER — Telehealth: Payer: Self-pay | Admitting: *Deleted

## 2022-04-29 VITALS — BP 137/74 | HR 77 | Temp 99.1°F | Resp 16 | Wt 277.0 lb

## 2022-04-29 DIAGNOSIS — G9601 Cranial cerebrospinal fluid leak, spontaneous: Secondary | ICD-10-CM | POA: Diagnosis not present

## 2022-04-29 DIAGNOSIS — D509 Iron deficiency anemia, unspecified: Secondary | ICD-10-CM

## 2022-04-29 DIAGNOSIS — K922 Gastrointestinal hemorrhage, unspecified: Secondary | ICD-10-CM

## 2022-04-29 DIAGNOSIS — I2510 Atherosclerotic heart disease of native coronary artery without angina pectoris: Secondary | ICD-10-CM

## 2022-04-29 LAB — CBC WITH DIFFERENTIAL/PLATELET
Basophils Absolute: 0 10*3/uL (ref 0.0–0.1)
Basophils Relative: 0.6 % (ref 0.0–3.0)
Eosinophils Absolute: 0 10*3/uL (ref 0.0–0.7)
Eosinophils Relative: 0.7 % (ref 0.0–5.0)
HCT: 25.1 % — ABNORMAL LOW (ref 36.0–46.0)
Hemoglobin: 7.8 g/dL — CL (ref 12.0–15.0)
Lymphocytes Relative: 21.2 % (ref 12.0–46.0)
Lymphs Abs: 1.3 10*3/uL (ref 0.7–4.0)
MCHC: 31.1 g/dL (ref 30.0–36.0)
MCV: 82.3 fl (ref 78.0–100.0)
Monocytes Absolute: 0.4 10*3/uL (ref 0.1–1.0)
Monocytes Relative: 6.9 % (ref 3.0–12.0)
Neutro Abs: 4.2 10*3/uL (ref 1.4–7.7)
Neutrophils Relative %: 70.6 % (ref 43.0–77.0)
Platelets: 305 10*3/uL (ref 150.0–400.0)
RBC: 3.05 Mil/uL — ABNORMAL LOW (ref 3.87–5.11)
RDW: 16.4 % — ABNORMAL HIGH (ref 11.5–15.5)
WBC: 5.9 10*3/uL (ref 4.0–10.5)

## 2022-04-29 LAB — BASIC METABOLIC PANEL
BUN: 11 mg/dL (ref 6–23)
CO2: 28 mEq/L (ref 19–32)
Calcium: 9.5 mg/dL (ref 8.4–10.5)
Chloride: 103 mEq/L (ref 96–112)
Creatinine, Ser: 0.54 mg/dL (ref 0.40–1.20)
GFR: 92.62 mL/min (ref >60.00)
Glucose, Bld: 137 mg/dL — ABNORMAL HIGH (ref 70–99)
Potassium: 4.7 mEq/L (ref 3.5–5.1)
Sodium: 138 mEq/L (ref 135–145)

## 2022-04-29 NOTE — Progress Notes (Addendum)
Subjective:   By signing my name below, I, Shehryar Baig, attest that this documentation has been prepared under the direction and in the presence of Debbrah Alar, NP. 04/29/2022    [name], female    DOB: Jan 25, 1951, 71 y.o.   MRN: [id number]  Chief Complaint  Patient presents with   [facility] follow up    HPI Patient is in today for a ER follow up visit.   Initially she presented to the ED the night of the 10/23 while in [place] due to BRBPR with clots. She was admitted and underwent a colonoscopy and endoscopy. Note was made of a colon polyp, otherwise they found no new issues. Lowest hgb during that admission was 7.2. She was not transfused. Thought was that her bleeding episode was due to a diverticular bleed. She was advised to discontinue voltaren and aspirin 13m. She denies having any shortness of breath. Denies any further BRBPR.    She continued feeling fatigued. As a result, she presented to the ED locally on 04/24/2022. She was noted to have a negative IFOB at that time and a hemoglobin of 7.7.   She reports generalized weakness.    Health Maintenance Due  Topic Date Due   Diabetic kidney evaluation - Urine ACR  Never done   COVID-19 Vaccine (5 - Moderna risk series) 07/02/2021   INFLUENZA VACCINE  01/22/2022   COLONOSCOPY (Pts [phone] coverage will need to be confirmed)  03/27/2022    Past Medical History:  Diagnosis Date   Anemia 04/05/2012   Anxiety    Anxiety state 09/11/2007   Qualifier: Diagnosis of  By: ToSherren MochaN, [facility]   Asthma    Atrial tachycardia, paroxysmal    Back pain 10/02/2014   Breast cancer ([facility]8/27/2018   CAD (coronary artery disease), native coronary artery    remote Cath with nonobstructive ASCAD with 20% mid LAD, 20% prox left circ and 20% ostial RCA   Chicken pox as a child   Chronic diastolic CHF (congestive heart failure), NYHA class 1 ([place]   Complication of anesthesia    pt  states feels different in her body after anesthesia when waking up and also experiences a smell of burnt plastic for approx a wk    Constipation    Depression    Diabetes ([facility]   Dilated aortic root ([place]   423my echo 08/2020   Dysuria 02/17/2017   Elevated LFTs 04/05/2012   GERD (gastroesophageal reflux disease)    Goiter    Heart murmur    hx of one at birth    History of kidney stones    History of radiation therapy 10/31/16-12/17/16   left breast 45 Gy in 25 fractions, left breast boost 16 Gy in 8 fractions   Hx of colonic polyps    Hyperlipidemia    Hypertension    Insomnia 10/08/2016   Joint pain    Malignant neoplasm of upper-outer quadrant of left female breast ([facility]CC[place]1/21/2017   not with patient   Measles as a child   Mumps as a child   OA (osteoarthritis) of knee 04/05/2012   Obesity 07/11/2014   PONV (postoperative nausea and vomiting)    Preventative health care 09/05/2013   PVC's (premature ventricular contractions) 05/02/2016   Shortness of breath dyspnea    walking distances / climbing stairs   Sleep apnea 04/05/2012   Tinea corporis 02/23/2013   Type 2  diabetes mellitus with hyperglycemia ([place]) 12/31/2013   Vasomotor rhinitis 04/05/2012   Ventral hernia    Wheezing     Past Surgical History:  Procedure Laterality Date   BREAST LUMPECTOMY Left 2017   BREAST LUMPECTOMY WITH RADIOACTIVE SEED AND SENTINEL LYMPH NODE BIOPSY Left 06/21/2016   Procedure: LEFT BREAST LUMPECTOMY WITH RADIOACTIVE SEED AND SENTINEL LYMPH NODE BIOPSY, INJECT BLUE DYE LEFT BREAST;  Surgeon: Fanny Skates, MD;  Location: [place];  Service: General;  Laterality: Left;   CARDIAC CATHETERIZATION     normal coroary arteries per patient   CARDIOVASCULAR STRESS TEST     10/12/2013   CESAREAN SECTION     X 3   CHOLECYSTECTOMY     COLONOSCOPY WITH PROPOFOL N/A 03/28/2015   Procedure: COLONOSCOPY WITH PROPOFOL;  Surgeon: Juanita Craver, MD;  Location: [facility];  Service: Endoscopy;  Laterality:  N/A;   HERNIA REPAIR  02/08/2011   ventral hernia   JOINT REPLACEMENT     bilateral   KNEE ARTHROSCOPY  05/2010   bilateral   LEFT AND RIGHT HEART CATHETERIZATION WITH CORONARY ANGIOGRAM N/A 11/08/2013   Procedure: LEFT AND RIGHT HEART CATHETERIZATION WITH CORONARY ANGIOGRAM;  Surgeon: [name], MD;  Location: [facility] CATH LAB;  Service: Cardiovascular;  Laterality: N/A;   MENISCUS REPAIR  2009   MOUTH SURGERY     teeth implants   PORT-A-CATH REMOVAL N/A 01/24/2017   Procedure: REMOVAL PORT-A-CATH;  Surgeon: Fanny Skates, MD;  Location: [place];  Service: General;  Laterality: N/A;   PORTACATH PLACEMENT Right 07/16/2016   Procedure: INSERTION PORT-A-CATH RIGHT INTERNAL JUGULAR WITH ULTRASOUND;  Surgeon: Fanny Skates, MD;  Location: [place];  Service: General;  Laterality: Right;   REPAIR DURAL / CSF LEAK  2021   performed at [facility] in [place]  2011   left   TOTAL KNEE ARTHROPLASTY Right 05/10/2014   Procedure: RIGHT TOTAL KNEE ARTHROPLASTY;  Surgeon: Mauri Pole, MD;  Location: [facility];  Service: Orthopedics;  Laterality: Right;   TOTAL THYROIDECTOMY Bilateral 2022   TUBAL LIGATION     WISDOM TOOTH EXTRACTION  2000    Family History  Problem Relation Age of Onset   Thyroid disease Mother        hypothyroid   Hyperlipidemia Mother    Depression Mother    Anxiety disorder Mother    Heart attack Father 22   Hypertension Father    Arthritis Father        RA   Coronary artery disease Father    High Cholesterol Father    Coronary artery disease Brother    Heart disease Brother    Cancer Maternal Grandmother        colon   Heart attack Maternal Grandfather    Alcohol abuse Paternal Grandfather    Cancer Maternal Aunt        colon    Social History   Socioeconomic History   Marital status: Divorced    Spouse name: Not on file   Number of children: 3   Years of education: Not on file   Highest education level: Not on  file  Occupational History   Occupation: retired - [employer]  Tobacco Use   Smoking status: Never   Smokeless tobacco: Never  Vaping Use   Vaping Use: Never used  Substance and Sexual Activity   Alcohol use: Not Currently    Comment: rarely   Drug use: No   Sexual activity:  Never  Other Topics Concern   Not on file  Social History Narrative   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  (08/13/2021)   Overall Financial Resource Strain (CARDIA)    Difficulty of Paying Living Expenses: Not hard at all  Food Insecurity: No Food Insecurity (02/26/2022)   Hunger Vital Sign    Worried About Running Out of Food in the Last Year: Never true    Ran Out of Food in the Last Year: Never true  Transportation Needs: No Transportation Needs (04/24/2022)   PRAPARE - Hydrologist (Medical): No    Lack of Transportation (Non-Medical): No  Physical Activity: Insufficiently Active (09/21/2021)   Exercise Vital Sign    Days of Exercise per Week: 4 days    Minutes of Exercise per Session: 10 min  Stress: No Stress Concern Present (08/13/2021)   [place]    Feeling of Stress : Not at all  Social Connections: Moderately Isolated (08/13/2021)   Social Connection and Isolation Panel [NHANES]    Frequency of Communication with Friends and Family: More than three times a week    Frequency of Social Gatherings with Friends and Family: More than three times a week    Attends Religious Services: More than 4 times per year    Active Member of [employer] or Organizations: No    Attends Archivist Meetings: Never    Marital Status: Divorced  Human resources officer Violence: Not At Risk (08/13/2021)   Humiliation, Afraid, Rape, and Kick questionnaire    Fear of Current or Ex-Partner: No    Emotionally Abused: No    Physically Abused:  No    Sexually Abused: No    Outpatient Medications Prior to Visit  Medication Sig Dispense Refill   acetaminophen (TYLENOL) 500 MG tablet Take 1,000 mg by mouth 2 (two) times daily as needed for moderate pain or headache.     albuterol (VENTOLIN HFA) 108 (90 Base) MCG/ACT inhaler Inhale 2 puffs every 4-6 hours as needed - rescue. 18 g 12   anastrozole (ARIMIDEX) 1 MG tablet Take 1 tablet (1 mg total) by mouth daily. 90 tablet 3   aspirin EC 81 MG tablet Take 81 mg by mouth daily. Swallow whole.     blood glucose meter kit and supplies Dispense based on patient and insurance preference. Use up to four times daily as directed. (FOR ICD-10 E10.9, E11.9). 1 each 0   budesonide-formoterol (SYMBICORT) 160-4.5 MCG/ACT inhaler Inhale 2 puffs into the lungs 2 (two) times daily.     busPIRone (BUSPAR) 7.5 MG tablet Take 1 tablet (7.5 mg total) by mouth 3 (three) times daily. As needed for anxiety 90 tablet 5   carvedilol (COREG) 25 MG tablet Take 1 tablet (25 mg total) by mouth 2 (two) times daily with a meal. 60 tablet 3   diclofenac (VOLTAREN) 75 MG EC tablet TAKE ONE TABLET TWICE DAILY 180 tablet 0   diltiazem (CARDIZEM CD) 120 MG 24 hr capsule Take 1 capsule (120 mg total) by mouth daily. 90 capsule 1   Docusate Calcium (STOOL SOFTENER PO) Take by mouth daily as needed.     escitalopram (LEXAPRO) 20 MG tablet TAKE 1 TABLET EVERY MORNING AND 1/2 EVERY EVENING 135 tablet 1   esomeprazole (NEXIUM) 40 MG capsule Take 1 capsule (40 mg total) by mouth daily. [place]  capsule 3   ezetimibe (ZETIA) 10 MG tablet Take 1 tablet (10 mg total) by mouth daily. 90 tablet 3   fenofibrate (TRICOR) 145 MG tablet TAKE ONE TABLET ONCE DAILY 90 tablet 1   Ferrous Fumarate-Folic Acid (HEMOCYTE-F) 324-1 MG TABS 1 tablet po daily 30 tablet 3   fluticasone (FLONASE) 50 MCG/ACT nasal spray Place 2 sprays into both nostrils daily. 16 g 6   furosemide (LASIX) 20 MG tablet TAKE ONE TABLET ONCE DAILY 90 tablet 0   glucose blood  (ACCU-CHEK AVIVA) test strip Twice daily (please use brand that patient insurance covers) 100 each 5   Hypromellose (ARTIFICIAL TEARS OP) Place 1 drop into both eyes daily as needed (dry eyes).      icosapent Ethyl (VASCEPA) 1 g capsule Take 2 capsules (2 g total) by mouth 2 (two) times daily. 360 capsule 3   insulin degludec (TRESIBA FLEXTOUCH) 200 UNIT/ML FlexTouch Pen Inject 12-20 Units into the skin daily. 9 mL 3   Insulin Pen Needle 32G X 4 MM MISC Use 1x a day 100 each 3   levothyroxine (SYNTHROID) 200 MCG tablet Take 1 tablet (200 mcg total) by mouth daily. 90 tablet 1   levothyroxine (SYNTHROID) 50 MCG tablet Take 1 tablet (50 mcg total) by mouth daily before breakfast. 90 tablet 0   loratadine (CLARITIN) 10 MG tablet Take 1 tablet (10 mg total) by mouth daily. 30 tablet 11   losartan (COZAAR) 100 MG tablet TAKE ONE TABLET BY MOUTH DAILY 90 tablet 0   meclizine (ANTIVERT) 25 MG tablet Take 25 mg by mouth every 6 (six) hours as needed.     metFORMIN (GLUCOPHAGE-XR) 500 MG 24 hr tablet Take 4 tablets (2,000 mg total) by mouth daily with supper. 360 tablet 3   montelukast (SINGULAIR) 10 MG tablet TAKE ONE TABLET AT BEDTIME 90 tablet 1   rosuvastatin (CRESTOR) 40 MG tablet Take 1 tablet (40 mg total) by mouth daily. 90 tablet 1   Semaglutide, 1 MG/DOSE, (OZEMPIC, 1 MG/DOSE,) 4 MG/3ML SOPN Inject 1 mg into the skin once a week. 9 mL 3   Vitamin D, Ergocalciferol, (DRISDOL) 1.25 MG (50000 UNIT) CAPS capsule Take 1 capsule (50,000 Units total) by mouth every 7 (seven) days. 4 capsule 4   No facility-administered medications prior to visit.    No Known Allergies  Review of Systems  Constitutional:  Positive for malaise/fatigue.  Respiratory:  Negative for shortness of breath.        Objective:    Physical Exam Constitutional:      General: She is not in acute distress.    Appearance: Normal appearance. She is not ill-appearing.  HENT:     Head: Normocephalic and atraumatic.      Right Ear: External ear normal.     Left Ear: External ear normal.  Eyes:     Extraocular Movements: Extraocular movements intact.     Pupils: Pupils are equal, round, and reactive to light.  Cardiovascular:     Rate and Rhythm: Normal rate and regular rhythm.     Heart sounds: Normal heart sounds. No murmur heard.    No gallop.  Pulmonary:     Effort: Pulmonary effort is normal. No respiratory distress.     Breath sounds: Normal breath sounds. No wheezing or rales.  Skin:    General: Skin is warm and dry.  Neurological:     Mental Status: She is alert and oriented to person, place, and time.  Psychiatric:  Judgment: Judgment normal.     BP 137/74 (BP Location: Right Arm, Patient Position: Sitting, Cuff Size: Large)   Pulse 77   Temp 99.1 F (37.3 C) (Oral)   Resp 16   Wt 277 lb (125.6 kg)   SpO2 99%   BMI 44.71 kg/m  Wt Readings from Last 3 Encounters:  04/29/22 277 lb (125.6 kg)  04/24/22 270 lb (122.5 kg)  02/14/22 287 lb 3.2 oz (130.3 kg)       Assessment & Plan:   Problem List Items Addressed This Visit       Unprioritized   Lower GI bleed    She is advised to return to the ED if she develops recurrent BRBPR or black tar like stools.  She is advised to follow up with her local GI, Dr. Collene Mares.        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 Clinically stable and has follow up with surgeon.       CAD (coronary artery disease), native coronary artery    Advised pt OK to resume aspirin 28m once daily for CV risk reduction.  Remain off of voltaren.       Anemia - Primary    Ferritin was 3 on 11/1. Will refer to hematology for evaluation/possible IV iron infusion. Repeat CBC today.       Relevant Orders   Ambulatory referral to Hematology / Oncology   Basic metabolic panel   CBC with Differential/Platelet   40 minutes spent on today's visit. Time was spent counseling patient, forming medical  plan and reviewing local and outside ([facility] medical records .  No orders of the defined types were placed in this encounter.   I, MNance Pear NP, personally preformed the services described in this documentation.  All medical record entries made by the scribe were at my direction and in my presence.  I have reviewed the chart and discharge instructions (if applicable) and agree that the record reflects my personal performance and is accurate and complete. 04/29/2022   I,Shehryar Baig,acting as a scribe for MNance Pear NP.,have documented all relevant documentation on the behalf of MNance Pear NP,as directed by  MNance Pear NP while in the presence of MNance Pear NP.   MNance Pear NP

## 2022-04-29 NOTE — Telephone Encounter (Signed)
Hgb 7.8 still high.  Labs are in.

## 2022-04-29 NOTE — Telephone Encounter (Signed)
Called pt to check in on her about her Critical labs A1C Pt states she feels fine and was advise if start to feel worsen to go ER  If has any blood in stool. Pt states she has appt with oncology next week.

## 2022-04-29 NOTE — Assessment & Plan Note (Addendum)
Ferritin was 3 on 11/1. Will refer to hematology for evaluation/possible IV iron infusion. Repeat CBC today.

## 2022-04-29 NOTE — Assessment & Plan Note (Signed)
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 Clinically stable and has follow up with surgeon.

## 2022-04-29 NOTE — Telephone Encounter (Signed)
I received this critical lab after I arrived home for the day.  Hg is stable over the last week and since her hospital admit [place] per notes.  I gave her a call - she has a hematology appt but not for about 10 days Patient states she is stable but feeling very tired. I advised her I will reach out to hematology and see if there is any possibility bring her in more quickly so she can start treatment for her iron deficiency anemia.  Suspect they will want to give her an iron infusion  If any return of bleeding or otherwise feeling worse in the meantime she will contact us

## 2022-04-29 NOTE — Assessment & Plan Note (Signed)
Advised pt OK to resume aspirin '81mg'$  once daily for CV risk reduction.  Remain off of voltaren.

## 2022-04-29 NOTE — Assessment & Plan Note (Signed)
She is advised to return to the ED if she develops recurrent BRBPR or black tar like stools.  She is advised to follow up with her local GI, Dr. Collene Mares.

## 2022-04-30 ENCOUNTER — Other Ambulatory Visit: Payer: Self-pay

## 2022-04-30 ENCOUNTER — Other Ambulatory Visit: Payer: Self-pay | Admitting: *Deleted

## 2022-04-30 ENCOUNTER — Other Ambulatory Visit: Payer: Self-pay | Admitting: Family

## 2022-04-30 ENCOUNTER — Inpatient Hospital Stay: Payer: Medicare Other | Attending: Family

## 2022-04-30 ENCOUNTER — Other Ambulatory Visit: Payer: Self-pay | Admitting: Hematology and Oncology

## 2022-04-30 ENCOUNTER — Encounter: Payer: Self-pay | Admitting: Family

## 2022-04-30 ENCOUNTER — Inpatient Hospital Stay: Payer: Medicare Other

## 2022-04-30 ENCOUNTER — Ambulatory Visit (INDEPENDENT_AMBULATORY_CARE_PROVIDER_SITE_OTHER): Payer: Medicare Other | Admitting: Pharmacist

## 2022-04-30 ENCOUNTER — Inpatient Hospital Stay (HOSPITAL_BASED_OUTPATIENT_CLINIC_OR_DEPARTMENT_OTHER): Payer: Medicare Other | Admitting: Family

## 2022-04-30 VITALS — BP 134/80 | HR 71 | Temp 98.7°F | Resp 19 | Ht 66.0 in | Wt 279.8 lb

## 2022-04-30 DIAGNOSIS — Z79899 Other long term (current) drug therapy: Secondary | ICD-10-CM | POA: Insufficient documentation

## 2022-04-30 DIAGNOSIS — Z170 Estrogen receptor positive status [ER+]: Secondary | ICD-10-CM

## 2022-04-30 DIAGNOSIS — I2510 Atherosclerotic heart disease of native coronary artery without angina pectoris: Secondary | ICD-10-CM | POA: Insufficient documentation

## 2022-04-30 DIAGNOSIS — K922 Gastrointestinal hemorrhage, unspecified: Secondary | ICD-10-CM

## 2022-04-30 DIAGNOSIS — E119 Type 2 diabetes mellitus without complications: Secondary | ICD-10-CM | POA: Diagnosis not present

## 2022-04-30 DIAGNOSIS — K219 Gastro-esophageal reflux disease without esophagitis: Secondary | ICD-10-CM | POA: Diagnosis not present

## 2022-04-30 DIAGNOSIS — Z87442 Personal history of urinary calculi: Secondary | ICD-10-CM | POA: Diagnosis not present

## 2022-04-30 DIAGNOSIS — R55 Syncope and collapse: Secondary | ICD-10-CM | POA: Diagnosis not present

## 2022-04-30 DIAGNOSIS — D509 Iron deficiency anemia, unspecified: Secondary | ICD-10-CM | POA: Insufficient documentation

## 2022-04-30 DIAGNOSIS — D649 Anemia, unspecified: Secondary | ICD-10-CM | POA: Diagnosis not present

## 2022-04-30 DIAGNOSIS — K921 Melena: Secondary | ICD-10-CM | POA: Insufficient documentation

## 2022-04-30 DIAGNOSIS — E785 Hyperlipidemia, unspecified: Secondary | ICD-10-CM | POA: Insufficient documentation

## 2022-04-30 DIAGNOSIS — I509 Heart failure, unspecified: Secondary | ICD-10-CM | POA: Insufficient documentation

## 2022-04-30 DIAGNOSIS — Z7982 Long term (current) use of aspirin: Secondary | ICD-10-CM | POA: Diagnosis not present

## 2022-04-30 DIAGNOSIS — Z794 Long term (current) use of insulin: Secondary | ICD-10-CM | POA: Diagnosis not present

## 2022-04-30 DIAGNOSIS — F419 Anxiety disorder, unspecified: Secondary | ICD-10-CM

## 2022-04-30 DIAGNOSIS — Z79811 Long term (current) use of aromatase inhibitors: Secondary | ICD-10-CM | POA: Insufficient documentation

## 2022-04-30 DIAGNOSIS — Z7951 Long term (current) use of inhaled steroids: Secondary | ICD-10-CM | POA: Diagnosis not present

## 2022-04-30 DIAGNOSIS — R6883 Chills (without fever): Secondary | ICD-10-CM | POA: Diagnosis not present

## 2022-04-30 DIAGNOSIS — C50412 Malignant neoplasm of upper-outer quadrant of left female breast: Secondary | ICD-10-CM

## 2022-04-30 DIAGNOSIS — R5383 Other fatigue: Secondary | ICD-10-CM | POA: Insufficient documentation

## 2022-04-30 DIAGNOSIS — R531 Weakness: Secondary | ICD-10-CM | POA: Insufficient documentation

## 2022-04-30 DIAGNOSIS — Z853 Personal history of malignant neoplasm of breast: Secondary | ICD-10-CM | POA: Diagnosis not present

## 2022-04-30 DIAGNOSIS — I110 Hypertensive heart disease with heart failure: Secondary | ICD-10-CM | POA: Diagnosis not present

## 2022-04-30 DIAGNOSIS — Z7984 Long term (current) use of oral hypoglycemic drugs: Secondary | ICD-10-CM | POA: Insufficient documentation

## 2022-04-30 DIAGNOSIS — Z79890 Hormone replacement therapy (postmenopausal): Secondary | ICD-10-CM | POA: Insufficient documentation

## 2022-04-30 DIAGNOSIS — R42 Dizziness and giddiness: Secondary | ICD-10-CM | POA: Insufficient documentation

## 2022-04-30 DIAGNOSIS — R002 Palpitations: Secondary | ICD-10-CM | POA: Diagnosis not present

## 2022-04-30 DIAGNOSIS — F32A Depression, unspecified: Secondary | ICD-10-CM

## 2022-04-30 DIAGNOSIS — D500 Iron deficiency anemia secondary to blood loss (chronic): Secondary | ICD-10-CM

## 2022-04-30 DIAGNOSIS — M1990 Unspecified osteoarthritis, unspecified site: Secondary | ICD-10-CM | POA: Diagnosis not present

## 2022-04-30 LAB — CBC WITH DIFFERENTIAL (CANCER CENTER ONLY)
Abs Immature Granulocytes: 0.02 10*3/uL (ref 0.00–0.07)
Basophils Absolute: 0 10*3/uL (ref 0.0–0.1)
Basophils Relative: 0 %
Eosinophils Absolute: 0.1 10*3/uL (ref 0.0–0.5)
Eosinophils Relative: 1 %
HCT: 26.3 % — ABNORMAL LOW (ref 36.0–46.0)
Hemoglobin: 7.6 g/dL — ABNORMAL LOW (ref 12.0–15.0)
Immature Granulocytes: 0 %
Lymphocytes Relative: 27 %
Lymphs Abs: 1.9 10*3/uL (ref 0.7–4.0)
MCH: 24.9 pg — ABNORMAL LOW (ref 26.0–34.0)
MCHC: 28.9 g/dL — ABNORMAL LOW (ref 30.0–36.0)
MCV: 86.2 fL (ref 80.0–100.0)
Monocytes Absolute: 0.5 10*3/uL (ref 0.1–1.0)
Monocytes Relative: 7 %
Neutro Abs: 4.5 10*3/uL (ref 1.7–7.7)
Neutrophils Relative %: 65 %
Platelet Count: 283 10*3/uL (ref 150–400)
RBC: 3.05 MIL/uL — ABNORMAL LOW (ref 3.87–5.11)
RDW: 15.3 % (ref 11.5–15.5)
WBC Count: 6.9 10*3/uL (ref 4.0–10.5)
nRBC: 0 % (ref 0.0–0.2)

## 2022-04-30 LAB — SAMPLE TO BLOOD BANK

## 2022-04-30 LAB — RETICULOCYTES
Immature Retic Fract: 11.1 % (ref 2.3–15.9)
RBC.: 3.03 MIL/uL — ABNORMAL LOW (ref 3.87–5.11)
Retic Count, Absolute: 72.3 10*3/uL (ref 19.0–186.0)
Retic Ct Pct: 2.4 % (ref 0.4–3.1)

## 2022-04-30 LAB — PREPARE RBC (CROSSMATCH)

## 2022-04-30 LAB — LACTATE DEHYDROGENASE: LDH: 131 U/L (ref 98–192)

## 2022-04-30 LAB — ABO/RH: ABO/RH(D): A POS

## 2022-04-30 MED ORDER — CARVEDILOL 25 MG PO TABS: 25.0000 mg | ORAL_TABLET | Freq: Two times a day (BID) | ORAL | 1 refills | Status: DC

## 2022-04-30 NOTE — Progress Notes (Signed)
Pharmacy Note  04/30/2022 Name: [name] MRN: [id number] DOB: [birth year]  Subjective: [name] is a 71 y.o. year old female who is a primary care patient of [name], MD. Clinical Pharmacist Practitioner referral was placed to assist with medication management.    Engaged with patient by telephone for follow up visit today.  Recent hospitalization in [place] while visiting her daughter. Patient presented to hospital for bright red blood per rectum. Noted to have anemia. Endoscopy and colonoscopy completed - noted 2 polyps. Felt drop in HGB possibly due to diverticular bleed. HGB noted to be low yesterday.  She was seen for initial visit with hematology today. Will receive treatment of 2 units of blood and IV iron on Thursday 05/02/2022.  Patient has stopped diclofenac. She also has been holding Ozempic though I don't suspect Ozempic was reason for drop in HGB. Recent BMP showed blood glucose os 127 and 110.  Other meds for DM is [place] - 6 units daily per patient and metformin ER 539m 4 tablets daily.  Anxiety - taking escitalopram daily. Has buspirone on her med list but has not filled since 11/2021. She states that while she was in hospital they gave her buspirone 3 times  a day and she felt more calm.  Medication Management - reviewed med list anf refill history. Patient needs the following meds refilled - anastrazole, carvedilol (requested 90 days), buspirone and metformin.   SDOH (Social Determinants of Health) assessments and interventions performed:  SDOH Interventions    FArkomaCoordination from 04/24/2022 in TIndependenceCoordination from 02/26/2022 in TGrapevilleManagement from 09/21/2021 in LShadow Lakeat MGrand RapidsHTraverseVisit from 07/16/2018 in CLaGr[place]Office Visit from 05/07/2018 in CDiamond [place]uffOffice Visit from 04/22/2018 in [place] SDOH Interventions        Food Insecurity Interventions -- Intervention Not Indicated -- -- -- --  Transportation Interventions Intervention Not Indicated Intervention Not Indicated -- -- -- --  Utilities Interventions -- Intervention Not Indicated -- -- -- --  Depression Interventions/Treatment  -- -- -- Currently on Treatment Currently on Treatment Currently on Treatment  Physical Activity Interventions -- -- Other (Comments) -- -- --        Objective: Review of patient status, including review of consultants reports, laboratory and other test data, was performed as part of comprehensive evaluation and provision of chronic care management services.   Lab Results  Component Value Date   CREATININE 0.54 04/29/2022   CREATININE 0.65 04/24/2022   CREATININE 0.82 01/22/2022    Lab Results  Component Value Date   HGBA1C 6.3 (A) 02/14/2022       Component Value Date/Time   CHOL 120 01/15/2022 0753   TRIG 227 (H) 01/15/2022 0753   HDL 31 (L) 01/15/2022 0753   CHOLHDL 3.9 01/15/2022 0753   CHOLHDL 3 07/16/2021 1213   VLDL 42.4 (H) 07/16/2021 1213   LDLCALC 52 01/15/2022 0753   LDLCALC 67 06/02/2020 1427   LDLDIRECT 56.0 07/16/2021 1213     Clinical ASCVD: Yes  The ASCVD Risk score (Arnett DK, et al., 2019) failed to calculate for the following reasons:   The valid total cholesterol range is 130 to 320 mg/dL    BP Readings from Last 3 Encounters:  04/30/22 134/80  04/29/22 137/74  04/24/22 134/76  No Known Allergies  Medications Reviewed Today     Reviewed by Cherre Robins, RPH-CPP (Pharmacist) on 04/30/22 at 71  Med List Status: <None>   Medication Order Taking? Sig Documenting Provider Last Dose Status Informant  acetaminophen (TYLENOL) 500 MG tablet [id number] Yes Take 1,000 mg by mouth 2 (two) times daily as needed for moderate pain or headache. [provider] Taking  Active Self  albuterol (VENTOLIN HFA) 108 (90 Base) MCG/ACT inhaler [id number] Yes Inhale 2 puffs every 4-6 hours as needed - rescue. Deneise Lever, MD Taking Active   anastrozole (ARIMIDEX) 1 MG tablet [id number] Yes Take 1 tablet (1 mg total) by mouth daily. Nicholas Lose, MD Taking Active   aspirin EC 81 MG tablet [id number] Yes Take 81 mg by mouth daily. Swallow whole. [provider] Taking Active   blood glucose meter kit and supplies [id number] No Dispense based on patient and insurance preference. Use up to four times daily as directed. (FOR ICD-10 E10.9, E11.9).  Patient not taking: Reported on 04/30/2022   [name], MD Not Taking Active   budesonide-formoterol [facility]) 160-4.5 MCG/ACT inhaler [id number] No Inhale 2 puffs into the lungs 2 (two) times daily.  Patient not taking: Reported on 04/30/2022   [provider] Not Taking Active            Med Note Antony Contras, [place] B   Fri Nov 30, 2021  4:14 PM) Approved to receive from [facility] and me program thru 06/23/2022  busPIRone (BUSPAR) 7.5 MG tablet [id number] Yes Take 1 tablet (7.5 mg total) by mouth 3 (three) times daily. As needed for anxiety Debbrah Alar, NP Taking Active   carvedilol (COREG) 25 MG tablet [id number] Yes Take 1 tablet (25 mg total) by mouth 2 (two) times daily with a meal. Debbrah Alar, NP Taking Active   diltiazem (CARDIZEM CD) 120 MG 24 hr capsule [id number] Yes Take 1 capsule (120 mg total) by mouth daily. Debbrah Alar, NP Taking Active   Docusate Calcium (STOOL SOFTENER PO) [id number] Yes Take by mouth daily as needed. [provider] Taking Active   escitalopram (LEXAPRO) 20 MG tablet [id number] Yes TAKE 1 TABLET EVERY MORNING AND 1/2 EVERY EVENING Debbrah Alar, NP Taking Active   esomeprazole (NEXIUM) 40 MG capsule [id number] Yes Take 1 capsule (40 mg total) by mouth daily. [name], MD Taking Active   ezetimibe (ZETIA) 10 MG tablet [id number] Yes Take 1 tablet  (10 mg total) by mouth daily. [name], MD Taking Active   fenofibrate (TRICOR) 145 MG tablet [id number] Yes TAKE ONE TABLET ONCE DAILY [name], MD Taking Active   Ferrous Fumarate-Folic Acid (HEMOCYTE-F) 324-1 MG TABS [id number]  1 tablet po daily [name], MD  Active   fluticasone (FLONASE) 50 MCG/ACT nasal spray [id number] Yes Place 2 sprays into both nostrils daily. [name], MD Taking Active   furosemide (LASIX) 20 MG tablet [id number] Yes TAKE ONE TABLET ONCE DAILY [name], MD Taking Active   glucose blood (ACCU-CHEK AVIVA) test strip [id number] No Twice daily (please use brand that patient insurance covers)  Patient not taking: Reported on 04/30/2022   [name], MD Not Taking Active   Hypromellose (ARTIFICIAL TEARS OP) [id number] Yes Place 1 drop into both eyes daily as needed (dry eyes).  [provider] Taking Active Self  insulin degludec (TRESIBA FLEXTOUCH) 200 UNIT/ML FlexTouch Pen [id number] Yes Inject 12-20 Units into the skin daily.  Patient taking differently: Inject 6  Units into the skin daily.   Philemon Kingdom, MD Taking Active   Insulin Pen Needle 32G X 4 MM MISC [id number] Yes Use 1x a day Philemon Kingdom, MD Taking Active   levothyroxine (SYNTHROID) 200 MCG tablet [id number] Yes Take 1 tablet (200 mcg total) by mouth daily. Debbrah Alar, NP Taking Active            Med Note Tyler Pita, STACEY I   Tue Apr 30, 2022  1:25 PM) Dose is 250 mcg. Take with 50 mcg pill.  levothyroxine (SYNTHROID) 50 MCG tablet [id number] Yes Take 1 tablet (50 mcg total) by mouth daily before breakfast. [name], MD Taking Active            Med Note Tyler Pita, STACEY I   Tue Apr 30, 2022  1:25 PM) Dose is 250 mcg. Take with 200 mcg pill.  loratadine (CLARITIN) 10 MG tablet [id number] Yes Take 1 tablet (10 mg total) by mouth daily. [name], MD Taking Active   losartan (COZAAR) 100 MG tablet [id number] Yes TAKE ONE TABLET BY MOUTH DAILY Debbrah Alar, NP Taking Active   meclizine (ANTIVERT) 25 MG tablet [id number] Yes Take 25 mg by mouth every 6 (six) hours as needed. [provider] Taking Active   metFORMIN (GLUCOPHAGE-XR) 500 MG 24 hr tablet [id number] Yes Take 4 tablets (2,000 mg total) by mouth daily with supper. Philemon Kingdom, MD Taking Active   montelukast (SINGULAIR) 10 MG tablet [id number] Yes TAKE ONE TABLET AT BEDTIME [name], MD Taking Active   rosuvastatin (CRESTOR) 40 MG tablet [id number] Yes Take 1 tablet (40 mg total) by mouth daily. Debbrah Alar, NP Taking Active   Semaglutide, 1 MG/DOSE, (OZEMPIC, 1 MG/DOSE,) 4 MG/3ML SOPN [id number] No Inject 1 mg into the skin once a week.  Patient not taking: Reported on 04/30/2022   Philemon Kingdom, MD Not Taking Active            Med Note Antony Contras, [place] B   Fri Nov 30, 2021  4:15 PM) Approved to received from [employer] 11/30/21 thru 05/23/2022  Vitamin D, Ergocalciferol, (DRISDOL) 1.25 MG (50000 UNIT) CAPS capsule [id number] Yes Take 1 capsule (50,000 Units total) by mouth every 7 (seven) days. [name], MD Taking Active             Patient Active Problem List   Diagnosis Date Noted   IDA (iron deficiency anemia) 04/30/2022   Lower GI bleed 04/29/2022   Allergic rhinitis 11/09/2021   Hx of papillary thyroid carcinoma 07/19/2021   Poorly controlled type 2 diabetes mellitus with circulatory disorder ([place]) 07/19/2021   Atypical chest pain 07/16/2021   Urinary incontinence 07/16/2021   Hypothyroidism 03/20/2021   Nausea 12/06/2020   Muscle cramps 12/06/2020   H/O thyroidectomy 08/30/2020   CSF leak from nose 01/13/2020   Uncontrolled type 2 diabetes mellitus with hyperglycemia ([place]) 04/14/2018   Vitamin D deficiency 03/30/2018   Dental infection 03/29/2018   Dysphagia 12/28/2017   Cough 12/28/2017   Peripheral neuropathy 09/22/2017   Lipoma 09/22/2017   Lymphedema 05/22/2017   Asthmatic bronchitis, mild intermittent, uncomplicated  81/15/7262   Breast cancer ([place]) 02/17/2017   Dysuria 02/17/2017   Insomnia 10/08/2016   Dilated aortic root ([place])    Port catheter in place 08/01/2016   Malignant neoplasm of upper-outer quadrant of left female breast ([place]) 05/14/2016   PVC's (premature ventricular contractions) 05/02/2016   Neck mass 04/21/2016   LVH (left ventricular hypertrophy) due to hypertensive  disease, with heart failure ([place]) 04/18/2016   Tonsillar hypertrophy 07/07/2015   Back pain 10/02/2014   Obesity 07/11/2014   S/P right TKA 05/10/2014   S/P knee replacement 05/10/2014   Atrial tachycardia, paroxysmal 02/22/2014   Type 2 diabetes mellitus with hyperglycemia ([place]) 12/31/2013   Chronic diastolic CHF (congestive heart failure) ([place]) 11/22/2013   CAD (coronary artery disease), native coronary artery 11/22/2013   Dyspnea on exertion 11/03/2013   Undiagnosed cardiac murmurs 09/05/2013   Preventative health care 09/05/2013   Musculoskeletal pain 11/10/2012   Vasomotor rhinitis 04/05/2012   Obstructive sleep apnea 04/05/2012   Anemia 04/05/2012   Elevated LFTs 04/05/2012   OA (osteoarthritis) of knee 04/05/2012   Hernia 10/13/2010   Hyperlipidemia 09/11/2007   Anxiety and depression 09/11/2007   Essential hypertension 09/11/2007   GERD 09/11/2007   RENAL CALCULUS 09/11/2007   RENAL CYST 09/11/2007   COLONIC POLYPS, HX OF 09/11/2007     Medication Assistance:   [name] and Ozempic obtained through [employer] medication assistance program.  Enrollment ends 05/2022; Patient has received mailing of [employer] patient assistance program application but has not started filling out yet. She has been getting Symbicort from [place] and Me Program - patient states she has 4 inhalers and she is not using every day. Will not reapply for [place] and Me program since they will not have Symbicort available in 2024. Plan to switch patient to using generic Symbicort.    Assessment / Plan: Type 2 DM - controlled.   Coordinated refill for metformin.  Continue current dose of Tresiba - 6 units daily.  Encouraged patient to start checking blood glucose daily.  Ok to [employer] for 1 more week until anemia treatment started.   Anxiety - improved when she was taking bupirone daily. .  Continue escitalopram daily.  Restart buspirone 7.53m 3 times a day - verified with pharmacy she does have refills  Medication Management - reviewed med list anf refill history.  Meds ordered this encounter  Medications   carvedilol (COREG) 25 MG tablet    Sig: Take 1 tablet (25 mg total) by mouth 2 (two) times daily with a meal.    Dispense:  180 tablet    Refill:  1     Follow Up:  Telephone follow up appointment with care management team member scheduled for:  1 to 2 months   TCherre Robins PharmD Clinical Pharmacist [phone]

## 2022-04-30 NOTE — Progress Notes (Signed)
Hematology/Oncology Consultation   Name: [name]      MRN: [id number]    Location: Room/bed info not found  Date: 04/30/2022 Time:1:35 PM   REFERRING PHYSICIAN: Debbrah Alar, NP  REASON FOR CONSULT: Iron deficiency anemia    DIAGNOSIS: Iron deficiency anemia secondary to recent GI bleed  HISTORY OF PRESENT ILLNESS:  [name] is a very pleasant 71 yo caucasian female with recent anemia noted while out of town in [place] requiring hospitalization. She had had bright blood in her stool at the time. She had a colonoscopy and endoscopy during admission and had 2 polyps removed. It was felt anemia may be due to diverticular bleed. No obvious source of blood loss was noted at that time.  She has not noted any new blood loss since discharge. She has a follow-up appointment with her gastroenterologist Dr. Verdia Kuba.  No abnormal bruising, no petechiae.  She states that when she had a cycle it was quite heavy with lots of clots. She is postmenopausal now.  Her daughters also have history of heavy cycles and anemia.  She restarted her baby aspirin yesterday.  She takes Nexium daily for GERD.  She is quite pale today. Hgb is only 7.6, MCV 86, platelets 283 and WBC count is 6.9.  She is symptomatic with fatigue, weakness, lightheadedness, near syncope, ringing in her ears, legs feeling heavy, chills, ice cravings and occasional palpitations.  She has history of left breast cancer, grade 3, ER positive, PR negative, HER 2 negative. She is followed by Dr. Jennette Kettle and Dirk Dress. She was treated with lumpectomy and then Taxol x 12: Herceptin and Perjeta every 3 weeks (07/25/2016 - 01/09/2017). Adjuvant radiation 10/31/2016 - 11/28/2016. She is currently on Arimidex (started 12/2016).  Mammogram in March was negative.  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 Cristine Polio at [facility]. She states that this went well. She still has some packing that she will have removed from the right nare at follow-up 05/06/2022.  She has type II diabetes and is on Tresiba, Metformin and Ozempic.  She had thyroidectomy in January 2022 for thyroid cancer and is on Synthroid 250 mcg PO daily. No other treatment was needed at that time.  No fever, n/v, cough, rash, SOB, chest pain, abdominal pain or changes in bladder habits.   No swelling, tenderness, numbness or tingling in her extremities.  No falls reported. She ambulates with a walker for added support.  No smoking, ETOH or recreational drug use.  Appetite and hydration are good. She has been on a vegan diet since May 2023. Weight is stable at 279 lbs.   ROS: All other 10 point review of systems is negative.   PAST MEDICAL HISTORY:   Past Medical History:  Diagnosis Date   Anemia 04/05/2012   Anxiety    Anxiety state 09/11/2007   Qualifier: Diagnosis of  By: Sherren Mocha RN, Dorian Pod     Asthma    Atrial tachycardia, paroxysmal    Back pain 10/02/2014   Breast cancer ([place]) 02/17/2017   CAD (coronary artery disease), native coronary artery    remote Cath with nonobstructive ASCAD with 20% mid LAD, 20% prox left circ and 20% ostial RCA   Chicken pox as a child   Chronic diastolic CHF (congestive heart failure), NYHA class 1 (HCC)    Complication of anesthesia  pt states feels different in her body after anesthesia when waking up and also experiences a smell of burnt plastic for approx a wk    Constipation    Depression    Diabetes ([place])    Dilated aortic root ([place])    29m by echo 08/2020   Dysuria 02/17/2017   Elevated LFTs 04/05/2012   GERD (gastroesophageal reflux disease)    Goiter    Heart murmur    hx of one at birth    History of kidney stones    History of radiation therapy 10/31/16-12/17/16   left breast 45 Gy in 25 fractions, left breast boost 16 Gy in 8 fractions   Hx of colonic polyps    Hyperlipidemia     Hypertension    Insomnia 10/08/2016   Joint pain    Malignant ne[place] of upper-outer quadrant of left [place] breast [place] 05/14/2016   not with patient   Measles as a child   Mumps as a child   OA (osteoarthritis) of knee 04/05/2012   Obesity 07/11/2014   PONV (postoperative nausea and vomiting)    Preventative health care 09/05/2013   PVC's (premature ventricular contractions) 05/02/2016   Shortness of breath dyspnea    walking distances / climbing stairs   Sleep apnea 04/05/2012   Tinea corporis 02/23/2013   Type 2 diabetes mellitus with hyperglycemia ([place] 12/31/2013   Vasomotor rhinitis 04/05/2012   Ventral hernia    Wheezing     ALLERGIES: No Known Allergies    MEDICATIONS:  Current Outpatient Medications on File Prior to Visit  Medication Sig Dispense Refill   acetaminophen (TYLENOL) 500 MG tablet Take 1,000 mg by mouth 2 (two) times daily as needed for moderate pain or headache.     albuterol (VENTOLIN HFA) 108 (90 Base) MCG/ACT inhaler Inhale 2 puffs every 4-6 hours as needed - rescue. 18 g 12   anastrozole (ARIMIDEX) 1 MG tablet Take 1 tablet (1 mg total) by mouth daily. 90 tablet 3   aspirin EC 81 MG tablet Take 81 mg by mouth daily. Swallow whole.     blood glucose meter kit and supplies Dispense based on patient and insurance preference. Use up to four times daily as directed. (FOR ICD-10 E10.9, E11.9). 1 each 0   budesonide-formoterol (SYMBICORT) 160-4.5 MCG/ACT inhaler Inhale 2 puffs into the lungs 2 (two) times daily.     busPIRone (BUSPAR) 7.5 MG tablet Take 1 tablet (7.5 mg total) by mouth 3 (three) times daily. As needed for anxiety 90 tablet 5   carvedilol (COREG) 25 MG tablet Take 1 tablet (25 mg total) by mouth 2 (two) times daily with a meal. 60 tablet 3   diclofenac (VOLTAREN) 75 MG EC tablet TAKE ONE TABLET TWICE DAILY 180 tablet 0   diltiazem (CARDIZEM CD) 120 MG 24 hr capsule Take 1 capsule (120 mg total) by mouth daily. 90 capsule 1   Docusate Calcium (STOOL  SOFTENER PO) Take by mouth daily as needed.     escitalopram (LEXAPRO) 20 MG tablet TAKE 1 TABLET EVERY MORNING AND 1/2 EVERY EVENING 135 tablet 1   esomeprazole (NEXIUM) 40 MG capsule Take 1 capsule (40 mg total) by mouth daily. 90 capsule 3   ezetimibe (ZETIA) 10 MG tablet Take 1 tablet (10 mg total) by mouth daily. 90 tablet 3   fenofibrate (TRICOR) 145 MG tablet TAKE ONE TABLET ONCE DAILY 90 tablet 1   Ferrous Fumarate-Folic Acid (HEMOCYTE-F) 324-1 MG TABS 1 tablet po daily 30 tablet  3   fluticasone (FLONASE) 50 MCG/ACT nasal spray Place 2 sprays into both nostrils daily. 16 g 6   furosemide (LASIX) 20 MG tablet TAKE ONE TABLET ONCE DAILY 90 tablet 0   glucose blood (ACCU-CHEK AVIVA) test strip Twice daily (please use brand that patient insurance covers) 100 each 5   Hypromellose (ARTIFICIAL TEARS OP) Place 1 drop into both eyes daily as needed (dry eyes).      insulin degludec (TRESIBA FLEXTOUCH) 200 UNIT/ML FlexTouch Pen Inject 12-20 Units into the skin daily. 9 mL 3   Insulin Pen Needle 32G X 4 MM MISC Use 1x a day 100 each 3   levothyroxine (SYNTHROID) 200 MCG tablet Take 1 tablet (200 mcg total) by mouth daily. 90 tablet 1   levothyroxine (SYNTHROID) 50 MCG tablet Take 1 tablet (50 mcg total) by mouth daily before breakfast. 90 tablet 0   loratadine (CLARITIN) 10 MG tablet Take 1 tablet (10 mg total) by mouth daily. 30 tablet 11   losartan (COZAAR) 100 MG tablet TAKE ONE TABLET BY MOUTH DAILY 90 tablet 0   meclizine (ANTIVERT) 25 MG tablet Take 25 mg by mouth every 6 (six) hours as needed.     metFORMIN (GLUCOPHAGE-XR) 500 MG 24 hr tablet Take 4 tablets (2,000 mg total) by mouth daily with supper. 360 tablet 3   montelukast (SINGULAIR) 10 MG tablet TAKE ONE TABLET AT BEDTIME 90 tablet 1   rosuvastatin (CRESTOR) 40 MG tablet Take 1 tablet (40 mg total) by mouth daily. 90 tablet 1   Vitamin D, Ergocalciferol, (DRISDOL) 1.25 MG (50000 UNIT) CAPS capsule Take 1 capsule (50,000 Units total)  by mouth every 7 (seven) days. 4 capsule 4   Semaglutide, 1 MG/DOSE, (OZEMPIC, 1 MG/DOSE,) 4 MG/3ML SOPN Inject 1 mg into the skin once a week. (Patient not taking: Reported on 04/30/2022) 9 mL 3   No current facility-administered medications on file prior to visit.     PAST SURGICAL HISTORY Past Surgical History:  Procedure Laterality Date   BREAST LUMPECTOMY Left 2017   BREAST LUMPECTOMY WITH RADIOACTIVE SEED AND SENTINEL LYMPH NODE BIOPSY Left 06/21/2016   Procedure: LEFT BREAST LUMPECTOMY WITH RADIOACTIVE SEED AND SENTINEL LYMPH NODE BIOPSY, INJECT BLUE DYE LEFT BREAST;  Surgeon: Fanny Skates, MD;  Location: [facility];  Service: General;  Laterality: Left;   CARDIAC CATHETERIZATION     normal coroary arteries per patient   CARDIOVASCULAR STRESS TEST     10/12/2013   CESAREAN SECTION     X 3   CHOLECYSTECTOMY     COLONOSCOPY WITH PROPOFOL N/A 03/28/2015   Procedure: COLONOSCOPY WITH PROPOFOL;  Surgeon: Juanita Craver, MD;  Location: [facility];  Service: Endoscopy;  Laterality: N/A;   HERNIA REPAIR  02/08/2011   ventral hernia   JOINT REPLACEMENT     bilateral   KNEE ARTHROSCOPY  05/2010   bilateral   LEFT AND RIGHT HEART CATHETERIZATION WITH CORONARY ANGIOGRAM N/A 11/08/2013   Procedure: LEFT AND RIGHT HEART CATHETERIZATION WITH CORONARY ANGIOGRAM;  Surgeon: [name], MD;  Location: [facility] C[facility] LAB;  Service: Cardiovascular;  Laterality: N/A;   MENISCUS REPAIR  2009   MOUTH SURGERY     teeth implants   PORT-A-CATH REMOVAL N/A 01/24/2017   Procedure: REMOVAL PORT-A-CATH;  Surgeon: Fanny Skates, MD;  Location: [place];  Service: General;  Laterality: N/A;   PORTACATH PLACEMENT Right 07/16/2016   Procedure: INSERTION PORT-A-CATH RIGHT INTERNAL JUGULAR WITH ULTRASOUND;  Surgeon: Fanny Skates, MD;  Location: [place];  Service: General;  Laterality: Right;   REPAIR DURAL / CSF LEAK  2021   performed at [facility] in [place]  2011   left    TOTAL KNEE ARTHROPLASTY Right 05/10/2014   Procedure: RIGHT TOTAL KNEE ARTHROPLASTY;  Surgeon: Mauri Pole, MD;  Location: [facility];  Service: Orthopedics;  Laterality: Right;   TOTAL THYROIDECTOMY Bilateral 2022   TUBAL LIGATION     WISDOM TOOTH EXTRACTION  2000    FAMILY HISTORY: Family History  Problem Relation Age of Onset   Thyroid disease Mother        hypothyroid   Hyperlipidemia Mother    Depression Mother    Anxiety disorder Mother    Heart attack Father 53   Hypertension Father    Arthritis Father        RA   Coronary artery disease Father    High Cholesterol Father    Coronary artery disease Brother    Heart disease Brother    Cancer Maternal Grandmother        colon   Heart attack Maternal Grandfather    Alcohol abuse Paternal Grandfather    Cancer Maternal Aunt        colon    SOCIAL HISTORY:  reports that she has never smoked. She has never used smokeless tobacco. She reports that she does not currently use alcohol. She reports that she does not use drugs.  PERFORMANCE STATUS: The patient's performance status is 1 - Symptomatic but completely ambulatory  PHYSICAL EXAM: Most Recent Vital Signs: Blood pressure 134/80, pulse 71, temperature 98.7 F (37.1 C), temperature source Oral, resp. rate 19, height 5' 6" (1.676 m), weight 279 lb 12.8 oz (126.9 kg), SpO2 99 %. BP 134/80 (BP Location: Right Arm, Patient Position: Sitting)   Pulse 71   Temp 98.7 F (37.1 C) (Oral)   Resp 19   Ht 5' 6" (1.676 m)   Wt 279 lb 12.8 oz (126.9 kg)   SpO2 99%   BMI 45.16 kg/m   General Appearance:    Alert, cooperative, no distress, appears stated age  Head:    Normocephalic, without obvious abnormality, atraumatic  Eyes:    PERRL, conjunctiva/corneas clear, EOM's intact, fundi    benign, both eyes        Throat:   Lips, mucosa, and tongue normal; teeth and gums normal  Neck:   Supple, symmetrical, trachea midline, no adenopathy;    thyroid:  no  enlargement/tenderness/nodules; no carotid   bruit or JVD  Back:     Symmetric, no curvature, ROM normal, no CVA tenderness  Lungs:     Clear to auscultation bilaterally, respirations unlabored  Chest Wall:    No tenderness or deformity   Heart:    Regular rate and rhythm, S1 and S2 normal, no murmur, rub   or gallop     Abdomen:     Soft, non-tender, bowel sounds active all four quadrants,    no masses, no organomegaly        Extremities:   Extremities normal, atraumatic, no cyanosis or edema  Pulses:   2+ and symmetric all extremities  Skin:   Skin color, texture, turgor normal, no rashes or lesions  Lymph nodes:   Cervical, supraclavicular, and axillary nodes normal  Neurologic:   CNII-XII intact, normal strength, sensation and reflexes    throughout    LABORATORY DATA:  Results for orders placed or performed in visit on 04/30/22 (from the  past 48 hour(s))  CBC with Differential (Cancer Center Only)     Status: Abnormal   Collection Time: 04/30/22  1:06 PM  Result Value Ref Range   WBC Count 6.9 4.0 - 10.5 K/uL   RBC 3.05 (L) 3.87 - 5.11 MIL/uL   Hemoglobin 7.6 (L) 12.0 - 15.0 g/dL   HCT 26.3 (L) 36.0 - 46.0 %   MCV 86.2 80.0 - 100.0 fL   MCH 24.9 (L) 26.0 - 34.0 pg   MCHC 28.9 (L) 30.0 - 36.0 g/dL   RDW 15.3 11.5 - 15.5 %   Platelet Count 283 150 - 400 K/uL   nRBC 0.0 0.0 - 0.2 %   Neutrophils Relative % 65 %   Neutro Abs 4.5 1.7 - 7.7 K/uL   Lymphocytes Relative 27 %   Lymphs Abs 1.9 0.7 - 4.0 K/uL   Monocytes Relative 7 %   Monocytes Absolute 0.5 0.1 - 1.0 K/uL   Eosinophils Relative 1 %   Eosinophils Absolute 0.1 0.0 - 0.5 K/uL   Basophils Relative 0 %   Basophils Absolute 0.0 0.0 - 0.1 K/uL   Immature Granulocytes 0 %   Abs Immature Granulocytes 0.02 0.00 - 0.07 K/uL    Comment: Performed at [facility] Lab at [facility], [address] [place], [place] [address]      RADIOGRAPHY: No results found.     PATHOLOGY:  None  ASSESSMENT/PLAN: Ms. Blakeley is a very pleasant 71 yo caucasian female with recent anemia secondary to GI blood loss.  We will have her com back this week on Thursday for 2 units of blood and IV iron.  She will come back in again next week for her second dose of iron.  Epo level pending. She may also benefit from an ESA.  Follow-up in 2 weeks.   All questions were answered. The patient knows to call the clinic with any problems, questions or concerns. We can certainly see the patient much sooner if necessary.   Lottie Dawson, NP

## 2022-05-01 LAB — ERYTHROPOIETIN: Erythropoietin: 325.3 m[IU]/mL — ABNORMAL HIGH (ref 2.6–18.5)

## 2022-05-02 ENCOUNTER — Telehealth: Payer: Self-pay | Admitting: Pharmacist

## 2022-05-02 ENCOUNTER — Inpatient Hospital Stay: Payer: Medicare Other

## 2022-05-02 VITALS — BP 155/75 | HR 65 | Temp 98.3°F | Resp 18

## 2022-05-02 DIAGNOSIS — D500 Iron deficiency anemia secondary to blood loss (chronic): Secondary | ICD-10-CM

## 2022-05-02 DIAGNOSIS — Z87442 Personal history of urinary calculi: Secondary | ICD-10-CM | POA: Diagnosis not present

## 2022-05-02 DIAGNOSIS — R002 Palpitations: Secondary | ICD-10-CM | POA: Diagnosis not present

## 2022-05-02 DIAGNOSIS — C50412 Malignant neoplasm of upper-outer quadrant of left female breast: Secondary | ICD-10-CM | POA: Diagnosis not present

## 2022-05-02 DIAGNOSIS — R6883 Chills (without fever): Secondary | ICD-10-CM | POA: Diagnosis not present

## 2022-05-02 DIAGNOSIS — Z7982 Long term (current) use of aspirin: Secondary | ICD-10-CM | POA: Diagnosis not present

## 2022-05-02 DIAGNOSIS — D649 Anemia, unspecified: Secondary | ICD-10-CM

## 2022-05-02 DIAGNOSIS — Z794 Long term (current) use of insulin: Secondary | ICD-10-CM | POA: Diagnosis not present

## 2022-05-02 DIAGNOSIS — Z7951 Long term (current) use of inhaled steroids: Secondary | ICD-10-CM | POA: Diagnosis not present

## 2022-05-02 DIAGNOSIS — Z170 Estrogen receptor positive status [ER+]: Secondary | ICD-10-CM | POA: Diagnosis not present

## 2022-05-02 DIAGNOSIS — R531 Weakness: Secondary | ICD-10-CM | POA: Diagnosis not present

## 2022-05-02 DIAGNOSIS — D509 Iron deficiency anemia, unspecified: Secondary | ICD-10-CM | POA: Diagnosis not present

## 2022-05-02 DIAGNOSIS — I2510 Atherosclerotic heart disease of native coronary artery without angina pectoris: Secondary | ICD-10-CM | POA: Diagnosis not present

## 2022-05-02 DIAGNOSIS — Z79811 Long term (current) use of aromatase inhibitors: Secondary | ICD-10-CM | POA: Diagnosis not present

## 2022-05-02 DIAGNOSIS — E119 Type 2 diabetes mellitus without complications: Secondary | ICD-10-CM | POA: Diagnosis not present

## 2022-05-02 DIAGNOSIS — Z79899 Other long term (current) drug therapy: Secondary | ICD-10-CM | POA: Diagnosis not present

## 2022-05-02 DIAGNOSIS — E785 Hyperlipidemia, unspecified: Secondary | ICD-10-CM | POA: Diagnosis not present

## 2022-05-02 DIAGNOSIS — K921 Melena: Secondary | ICD-10-CM | POA: Diagnosis not present

## 2022-05-02 DIAGNOSIS — I110 Hypertensive heart disease with heart failure: Secondary | ICD-10-CM | POA: Diagnosis not present

## 2022-05-02 DIAGNOSIS — R5383 Other fatigue: Secondary | ICD-10-CM | POA: Diagnosis not present

## 2022-05-02 DIAGNOSIS — R42 Dizziness and giddiness: Secondary | ICD-10-CM | POA: Diagnosis not present

## 2022-05-02 DIAGNOSIS — K922 Gastrointestinal hemorrhage, unspecified: Secondary | ICD-10-CM

## 2022-05-02 DIAGNOSIS — I509 Heart failure, unspecified: Secondary | ICD-10-CM | POA: Diagnosis not present

## 2022-05-02 DIAGNOSIS — Z95828 Presence of other vascular implants and grafts: Secondary | ICD-10-CM

## 2022-05-02 DIAGNOSIS — K219 Gastro-esophageal reflux disease without esophagitis: Secondary | ICD-10-CM | POA: Diagnosis not present

## 2022-05-02 DIAGNOSIS — R55 Syncope and collapse: Secondary | ICD-10-CM | POA: Diagnosis not present

## 2022-05-02 DIAGNOSIS — M1990 Unspecified osteoarthritis, unspecified site: Secondary | ICD-10-CM | POA: Diagnosis not present

## 2022-05-02 DIAGNOSIS — Z853 Personal history of malignant neoplasm of breast: Secondary | ICD-10-CM | POA: Diagnosis not present

## 2022-05-02 MED ORDER — SODIUM CHLORIDE 0.9 % IV SOLN
510.0000 mg | Freq: Once | INTRAVENOUS | Status: AC
  Administered 2022-05-02: 510 mg via INTRAVENOUS
  Filled 2022-05-02: qty 17

## 2022-05-02 MED ORDER — ACETAMINOPHEN 325 MG PO TABS
650.0000 mg | ORAL_TABLET | Freq: Once | ORAL | Status: AC
  Administered 2022-05-02: 650 mg via ORAL
  Filled 2022-05-02: qty 2

## 2022-05-02 MED ORDER — SODIUM CHLORIDE 0.9 % IV SOLN: Freq: Once | INTRAVENOUS | Status: AC

## 2022-05-02 MED ORDER — SODIUM CHLORIDE 0.9% IV SOLUTION
250.0000 mL | Freq: Once | INTRAVENOUS | Status: AC
  Administered 2022-05-02: 250 mL via INTRAVENOUS

## 2022-05-02 MED ORDER — FUROSEMIDE 10 MG/ML IJ SOLN
20.0000 mg | Freq: Once | INTRAMUSCULAR | Status: AC
  Administered 2022-05-02: 20 mg via INTRAVENOUS
  Filled 2022-05-02: qty 4

## 2022-05-02 MED ORDER — DIPHENHYDRAMINE HCL 25 MG PO CAPS
25.0000 mg | ORAL_CAPSULE | Freq: Once | ORAL | Status: AC
  Administered 2022-05-02: 25 mg via ORAL
  Filled 2022-05-02: qty 1

## 2022-05-02 NOTE — Progress Notes (Signed)
Patient IV became occluded at 1055/ Blood transfusion was stopped and another IV was initiated at 1111. Patient has poor peripheral venous access and at the time of the IV insertion she became unresponsive for a minute. She stated "I blacked out". No other symptoms. Dr. Marin Olp was notified, VS taken at 11:12 were WNL.Transfusion restarted without further incidents.

## 2022-05-02 NOTE — Telephone Encounter (Signed)
Received delivery of Ozempic and Tresiba form [employer] patient assistance program. LM On patient's voicemail that medication had arrived and she would pick up at her convenience during our office hours.

## 2022-05-02 NOTE — Patient Instructions (Signed)
Blood Transfusion, Adult, Care After After a blood transfusion, it is common to have: Bruising and soreness at the IV site. A headache. Follow these instructions at home: Your doctor may give you more instructions. If you have problems, contact your doctor. Insertion site care     Follow instructions from your doctor about how to take care of your insertion site. This is where an IV tube was put into your vein. Make sure you: Wash your hands with soap and water for at least 20 seconds before and after you change your bandage. If you cannot use soap and water, use hand sanitizer. Change your bandage as told by your doctor. Check your insertion site every day for signs of infection. Check for: Redness, swelling, or pain. Bleeding from the site. Warmth. Pus or a bad smell. General instructions Take over-the-counter and prescription medicines only as told by your doctor. Rest as told by your doctor. Go back to your normal activities as told by your doctor. Keep all follow-up visits. You may need to have tests at certain times to check your blood. Contact a doctor if: You have itching or red, swollen areas of skin (hives). You have a fever or chills. You have pain in the head, back, or chest. You feel worried or nervous (anxious). You feel weak after doing your normal activities. You have any of these problems at the insertion site: Redness, swelling, warmth, or pain. Bleeding that does not stop with pressure. Pus or a bad smell. If you received your blood transfusion in an outpatient setting, you will be told whom to contact to report any reactions. Get help right away if: You have signs of a serious reaction. This may be coming from an allergy or the body's defense system (immune system). Signs include: Trouble breathing or shortness of breath. Swelling of the face or feeling warm (flushed). A widespread rash. Dark pee (urine) or blood in the pee. Fast heartbeat. These symptoms  may be an emergency. Get help right away. Call 911. Do not wait to see if the symptoms will go away. Do not drive yourself to the hospital. Summary Bruising and soreness at the IV site are common. Check your insertion site every day for signs of infection. Rest as told by your doctor. Go back to your normal activities as told by your doctor. Get help right away if you have signs of a serious reaction. This information is not intended to replace advice given to you by your health care provider. Make sure you discuss any questions you have with your health care provider. Document Revised: 09/07/2021 Document Reviewed: 09/07/2021 Elsevier Patient Education  [place].  Iron Sucrose Injection What is this medication? IRON SUCROSE (EYE ern SOO krose) treats low levels of iron (iron deficiency anemia) in people with kidney disease. Iron is a mineral that plays an important role in making red blood cells, which carry oxygen from your lungs to the rest of your body. This medicine may be used for other purposes; ask your health care provider or pharmacist if you have questions. COMMON BRAND NAME(S): Venofer What should I tell my care team before I take this medication? They need to know if you have any of these conditions: Anemia not caused by low iron levels Heart disease High levels of iron in the blood Kidney disease Liver disease An unusual or allergic reaction to iron, other medications, foods, dyes, or preservatives Pregnant or trying to get pregnant Breastfeeding How should I use this medication?  This medication is for infusion into a vein. It is given in a hospital or clinic setting. Talk to your care team about the use of this medication in children. While this medication may be prescribed for children as young as 2 years for selected conditions, precautions do apply. Overdosage: If you think you have taken too much of this medicine contact a poison control center or emergency  room at once. NOTE: This medicine is only for you. Do not share this medicine with others. What if I miss a dose? Keep appointments for follow-up doses. It is important not to miss your dose. Call your care team if you are unable to keep an appointment. What may interact with this medication? Do not take this medication with any of the following: Deferoxamine Dimercaprol Other iron products This medication may also interact with the following: Chloramphenicol Deferasirox This list may not describe all possible interactions. Give your health care provider a list of all the medicines, herbs, non-prescription drugs, or dietary supplements you use. Also tell them if you smoke, drink alcohol, or use illegal drugs. Some items may interact with your medicine. What should I watch for while using this medication? Visit your care team regularly. Tell your care team if your symptoms do not start to get better or if they get worse. You may need blood work done while you are taking this medication. You may need to follow a special diet. Talk to your care team. Foods that contain iron include: whole grains/cereals, dried fruits, beans, or peas, leafy green vegetables, and organ meats (liver, kidney). What side effects may I notice from receiving this medication? Side effects that you should report to your care team as soon as possible: Allergic reactions--skin rash, itching, hives, swelling of the face, lips, tongue, or throat Low blood pressure--dizziness, feeling faint or lightheaded, blurry vision Shortness of breath Side effects that usually do not require medical attention (report to your care team if they continue or are bothersome): Flushing Headache Joint pain Muscle pain Nausea Pain, redness, or irritation at injection site This list may not describe all possible side effects. Call your doctor for medical advice about side effects. You may report side effects to FDA at 1-800-FDA-1088. Where  should I keep my medication? This medication is given in a hospital or clinic and will not be stored at home. NOTE: This sheet is a summary. It may not cover all possible information. If you have questions about this medicine, talk to your doctor, pharmacist, or health care provider.  2023 Elsevier/Gold Standard (2020-09-21 00:00:00)

## 2022-05-03 LAB — BPAM RBC
Blood Product Expiration Date: 202311262359
Blood Product Expiration Date: 202311272359
ISSUE DATE / TIME: 202311090803
ISSUE DATE / TIME: 202311090803
Unit Type and Rh: 6200
Unit Type and Rh: 6200

## 2022-05-03 LAB — TYPE AND SCREEN
ABO/RH(D): A POS
Antibody Screen: NEGATIVE
Unit division: 0
Unit division: 0

## 2022-05-06 ENCOUNTER — Telehealth: Payer: Self-pay | Admitting: Hematology and Oncology

## 2022-05-06 ENCOUNTER — Telehealth: Payer: Self-pay

## 2022-05-06 NOTE — Telephone Encounter (Signed)
     Patient  visit on 11/1  at [place] you been able to follow up with your primary care physician? YES  The patient was or was not able to obtain any needed medicine or equipment. YES  Are there diet recommendations that you are having difficulty following? NA  Patient expresses understanding of discharge instructions and education provided has no other needs at this time. [place], [facility], Care Management  [phone] [address], [place], [place] [address] Phone: [phone] Email: [email or web address]

## 2022-05-06 NOTE — Telephone Encounter (Signed)
Scheduled appointment per 11/8 staff message. Patient is aware.

## 2022-05-07 DIAGNOSIS — J328 Other chronic sinusitis: Secondary | ICD-10-CM | POA: Diagnosis not present

## 2022-05-09 ENCOUNTER — Inpatient Hospital Stay: Payer: Medicare Other

## 2022-05-09 VITALS — BP 139/78 | HR 57 | Temp 97.6°F | Resp 18

## 2022-05-09 DIAGNOSIS — R42 Dizziness and giddiness: Secondary | ICD-10-CM | POA: Diagnosis not present

## 2022-05-09 DIAGNOSIS — R5383 Other fatigue: Secondary | ICD-10-CM | POA: Diagnosis not present

## 2022-05-09 DIAGNOSIS — R002 Palpitations: Secondary | ICD-10-CM | POA: Diagnosis not present

## 2022-05-09 DIAGNOSIS — I110 Hypertensive heart disease with heart failure: Secondary | ICD-10-CM | POA: Diagnosis not present

## 2022-05-09 DIAGNOSIS — Z79811 Long term (current) use of aromatase inhibitors: Secondary | ICD-10-CM | POA: Diagnosis not present

## 2022-05-09 DIAGNOSIS — Z170 Estrogen receptor positive status [ER+]: Secondary | ICD-10-CM | POA: Diagnosis not present

## 2022-05-09 DIAGNOSIS — R6883 Chills (without fever): Secondary | ICD-10-CM | POA: Diagnosis not present

## 2022-05-09 DIAGNOSIS — I2510 Atherosclerotic heart disease of native coronary artery without angina pectoris: Secondary | ICD-10-CM | POA: Diagnosis not present

## 2022-05-09 DIAGNOSIS — R55 Syncope and collapse: Secondary | ICD-10-CM | POA: Diagnosis not present

## 2022-05-09 DIAGNOSIS — I509 Heart failure, unspecified: Secondary | ICD-10-CM | POA: Diagnosis not present

## 2022-05-09 DIAGNOSIS — Z7951 Long term (current) use of inhaled steroids: Secondary | ICD-10-CM | POA: Diagnosis not present

## 2022-05-09 DIAGNOSIS — Z794 Long term (current) use of insulin: Secondary | ICD-10-CM | POA: Diagnosis not present

## 2022-05-09 DIAGNOSIS — E119 Type 2 diabetes mellitus without complications: Secondary | ICD-10-CM | POA: Diagnosis not present

## 2022-05-09 DIAGNOSIS — D509 Iron deficiency anemia, unspecified: Secondary | ICD-10-CM | POA: Diagnosis not present

## 2022-05-09 DIAGNOSIS — C50412 Malignant neoplasm of upper-outer quadrant of left female breast: Secondary | ICD-10-CM | POA: Diagnosis not present

## 2022-05-09 DIAGNOSIS — K921 Melena: Secondary | ICD-10-CM | POA: Diagnosis not present

## 2022-05-09 DIAGNOSIS — M1990 Unspecified osteoarthritis, unspecified site: Secondary | ICD-10-CM | POA: Diagnosis not present

## 2022-05-09 DIAGNOSIS — D500 Iron deficiency anemia secondary to blood loss (chronic): Secondary | ICD-10-CM

## 2022-05-09 DIAGNOSIS — Z7982 Long term (current) use of aspirin: Secondary | ICD-10-CM | POA: Diagnosis not present

## 2022-05-09 DIAGNOSIS — Z95828 Presence of other vascular implants and grafts: Secondary | ICD-10-CM

## 2022-05-09 DIAGNOSIS — E785 Hyperlipidemia, unspecified: Secondary | ICD-10-CM | POA: Diagnosis not present

## 2022-05-09 DIAGNOSIS — Z87442 Personal history of urinary calculi: Secondary | ICD-10-CM | POA: Diagnosis not present

## 2022-05-09 DIAGNOSIS — K219 Gastro-esophageal reflux disease without esophagitis: Secondary | ICD-10-CM | POA: Diagnosis not present

## 2022-05-09 DIAGNOSIS — Z79899 Other long term (current) drug therapy: Secondary | ICD-10-CM | POA: Diagnosis not present

## 2022-05-09 DIAGNOSIS — R531 Weakness: Secondary | ICD-10-CM | POA: Diagnosis not present

## 2022-05-09 DIAGNOSIS — Z853 Personal history of malignant neoplasm of breast: Secondary | ICD-10-CM | POA: Diagnosis not present

## 2022-05-09 MED ORDER — SODIUM CHLORIDE 0.9 % IV SOLN: Freq: Once | INTRAVENOUS | Status: AC

## 2022-05-09 MED ORDER — SODIUM CHLORIDE 0.9 % IV SOLN
510.0000 mg | Freq: Once | INTRAVENOUS | Status: AC
  Administered 2022-05-09: 510 mg via INTRAVENOUS
  Filled 2022-05-09: qty 17

## 2022-05-09 NOTE — Progress Notes (Signed)
Patient refused to wait 30 minutes post infusion. Released stable and ASX. 

## 2022-05-09 NOTE — Patient Instructions (Signed)

## 2022-05-10 ENCOUNTER — Ambulatory Visit: Payer: Medicare Other | Admitting: Internal Medicine

## 2022-05-10 ENCOUNTER — Other Ambulatory Visit: Payer: Medicare Other

## 2022-05-10 ENCOUNTER — Inpatient Hospital Stay: Payer: Medicare Other | Admitting: Family

## 2022-05-17 NOTE — Progress Notes (Signed)
Patient Care Team: Nicholas Lose, MD as PCP - General (Hematology and Oncology) Sueanne Margarita, MD as PCP - Cardiology (Cardiology) Fanny Skates, MD as Consulting Physician (General Surgery) Nicholas Lose, MD as Consulting Physician (Hematology and Oncology) Gery Pray, MD as Consulting Physician (Radiation Oncology) Delice Bison, Charlestine Massed, NP as Nurse Practitioner (Hematology and Oncology) Cherre Robins, [place] (Pharmacist) Luretha Rued, RN as [facility] Management  DIAGNOSIS:  Encounter Diagnosis  Name Primary?   Malignant neoplasm of upper-outer quadrant of left breast in female, estrogen receptor positive ([place]) Yes    SUMMARY OF ONCOLOGIC HISTORY: Oncology History  Malignant neoplasm of upper-outer quadrant of left female breast ([place])  05/13/2016 Initial Diagnosis   Left breast biopsy 3:00: IDC, grade 3, ER 30%, PR 0%, Ki-67 40%, HER-2 positive ratio 2.71, screening detected left breast mass 9 x 7 x 6 mm, axilla negative, T1 BN 0 stage IA clinical stage   06/21/2016 Surgery   Left lumpectomy: IDC grade 3, 1.1 cm, 1/5 LN Positive with ECE, Margins Neg, LVI Present; Er 30%, PR 0%, Ki 67 40%, Her 2 Positive Ratio 2.71; T1CN1 (Stage 2B)   07/25/2016 - 01/09/2017 Chemotherapy   Taxol weekly 12; Herceptin and Perjeta every 3 weeks     10/31/2016 - 11/28/2016 Radiation Therapy   Adjuvant radiation therapy   12/2016 -  Anti-estrogen oral therapy   Anastrozole daily     CHIEF COMPLIANT: Follow-up left cancer on anastrozole  INTERVAL HISTORY: [name] is a 71 y.o with a history of left breast cancer treated with lumpectomy, adjuvant radiation, and who is currently on anastrozole therapy. She presents to the clinic for a follow-up. She reports that she was in the hospital for CSF leak.  This has happened in 2021 as well as in September 2023.  This had to be repaired at [facility].  She went to [place] and an experienced heavy GI bleed.   She was admitted to the hospital with hypotension and was given blood transfusion as well as iron infusions.  She is going to see Dr. Collene Mares.  She had an upper endoscopy and colonoscopy which apparently did not show the cause of bleeding.  She was given 2 units of PRBC recently.     ALLERGIES:  is allergic to oxycodone.  MEDICATIONS:  Current Outpatient Medications  Medication Sig Dispense Refill   cephALEXin (KEFLEX) 500 MG capsule Take 500 mg by mouth 2 (two) times daily.     oxyCODONE (OXY IR/ROXICODONE) 5 MG immediate release tablet Take by mouth.     acetaminophen (TYLENOL) 500 MG tablet Take 1,000 mg by mouth 2 (two) times daily as needed for moderate pain or headache.     albuterol (VENTOLIN HFA) 108 (90 Base) MCG/ACT inhaler Inhale 2 puffs every 4-6 hours as needed - rescue. 18 g 12   anastrozole (ARIMIDEX) 1 MG tablet Take 1 tablet (1 mg total) by mouth daily. 90 tablet 3   aspirin EC 81 MG tablet Take 81 mg by mouth daily. Swallow whole.     blood glucose meter kit and supplies Dispense based on patient and insurance preference. Use up to four times daily as directed. (FOR ICD-10 E10.9, E11.9). (Patient not taking: Reported on 04/30/2022) 1 each 0   budesonide-formoterol (SYMBICORT) 160-4.5 MCG/ACT inhaler Inhale 2 puffs into the lungs 2 (two) times daily. (Patient not taking: Reported on 04/30/2022)     busPIRone (BUSPAR) 7.5 MG tablet Take 1 tablet (7.5 mg total) by mouth  3 (three) times daily. As needed for anxiety 90 tablet 5   carvedilol (COREG) 25 MG tablet Take 1 tablet (25 mg total) by mouth 2 (two) times daily with a meal. 180 tablet 1   diltiazem (CARDIZEM CD) 120 MG 24 hr capsule Take 1 capsule (120 mg total) by mouth daily. 90 capsule 1   Docusate Calcium (STOOL SOFTENER PO) Take by mouth daily as needed.     escitalopram (LEXAPRO) 20 MG tablet TAKE 1 TABLET EVERY MORNING AND 1/2 EVERY EVENING 135 tablet 1   esomeprazole (NEXIUM) 40 MG capsule Take 1 capsule (40 mg total) by  mouth daily. 90 capsule 3   ezetimibe (ZETIA) 10 MG tablet Take 1 tablet (10 mg total) by mouth daily. 90 tablet 3   fenofibrate (TRICOR) 145 MG tablet TAKE ONE TABLET ONCE DAILY 90 tablet 1   Ferrous Fumarate-Folic Acid (HEMOCYTE-F) 324-1 MG TABS 1 tablet po daily 30 tablet 3   fluticasone (FLONASE) 50 MCG/ACT nasal spray Place 2 sprays into both nostrils daily. 16 g 6   furosemide (LASIX) 20 MG tablet TAKE ONE TABLET ONCE DAILY 90 tablet 0   glucose blood (ACCU-CHEK AVIVA) test strip Twice daily (please use brand that patient insurance covers) (Patient not taking: Reported on 04/30/2022) 100 each 5   Hypromellose (ARTIFICIAL TEARS OP) Place 1 drop into both eyes daily as needed (dry eyes).      insulin degludec (TRESIBA FLEXTOUCH) 200 UNIT/ML FlexTouch Pen Inject 12-20 Units into the skin daily. (Patient taking differently: Inject 6 Units into the skin daily.) 9 mL 3   Insulin Pen Needle 32G X 4 MM MISC Use 1x a day 100 each 3   levothyroxine (SYNTHROID) 200 MCG tablet Take 1 tablet (200 mcg total) by mouth daily. 90 tablet 1   levothyroxine (SYNTHROID) 50 MCG tablet Take 1 tablet (50 mcg total) by mouth daily before breakfast. 90 tablet 0   loratadine (CLARITIN) 10 MG tablet Take 1 tablet (10 mg total) by mouth daily. 30 tablet 11   losartan (COZAAR) 100 MG tablet TAKE ONE TABLET BY MOUTH DAILY 90 tablet 0   meclizine (ANTIVERT) 25 MG tablet Take 25 mg by mouth every 6 (six) hours as needed.     metFORMIN (GLUCOPHAGE-XR) 500 MG 24 hr tablet Take 4 tablets (2,000 mg total) by mouth daily with supper. 360 tablet 3   montelukast (SINGULAIR) 10 MG tablet TAKE ONE TABLET AT BEDTIME 90 tablet 1   rosuvastatin (CRESTOR) 40 MG tablet Take 1 tablet (40 mg total) by mouth daily. 90 tablet 1   Semaglutide, 1 MG/DOSE, (OZEMPIC, 1 MG/DOSE,) 4 MG/3ML SOPN Inject 1 mg into the skin once a week. (Patient not taking: Reported on 04/30/2022) 9 mL 3   Vitamin D, Ergocalciferol, (DRISDOL) 1.25 MG (50000 UNIT) CAPS  capsule Take 1 capsule (50,000 Units total) by mouth every 7 (seven) days. 4 capsule 4   No current facility-administered medications for this visit.    PHYSICAL EXAMINATION: ECOG PERFORMANCE STATUS: 1 - Symptomatic but completely ambulatory  Vitals:   05/21/22 0932  BP: 139/79  Pulse: 65  Resp: 18  Temp: 97.8 F (36.6 C)  SpO2: 97%   Filed Weights   05/21/22 0932  Weight: 279 lb (126.6 kg)     LABORATORY DATA:  I have reviewed the data as listed    Latest Ref Rng & Units 04/29/2022   10:16 AM 04/24/2022    3:56 PM 01/22/2022    9:31 AM  CMP  Glucose 70 - 99 mg/dL 137  110  146   BUN 6 - 23 mg/dL _0 Creatinine 0.40 - 1.20 mg/dL 0.54  0.65  0.82   Sodium 135 - 145 mEq/L 138  138  139   Potassium 3.5 - 5.1 mEq/L 4.7 specimen hemolyzed  C 4.1  4.7   Chloride 96 - 112 mEq/L 103  102  102   CO2 19 - 32 mEq/L _1 Calcium 8.4 - 10.5 mg/dL 9.5  9.2  9.5   Total Protein 6.5 - 8.1 g/dL  6.6    Total Bilirubin 0.3 - 1.2 mg/dL  0.3    Alkaline Phos 38 - 126 U/L  58    AST 15 - 41 U/L  24    ALT 0 - 44 U/L  15      C Corrected result    Lab Results  Component Value Date   WBC 6.9 04/30/2022   HGB 7.6 (L) 04/30/2022   HCT 26.3 (L) 04/30/2022   MCV 86.2 04/30/2022   PLT 283 04/30/2022   NEUTROABS 4.5 04/30/2022    ASSESSMENT & PLAN:  Malignant neoplasm of upper-outer quadrant of left female breast ([place]) 06/21/16: Left lumpectomy: IDC grade 3, 1.1 cm, 1/5 LN Positive with ECE, Margins Neg, LVI Present; Er 30%, PR 0%, Ki 67 40%, Her 2 Positive Ratio 2.71; T1CN1 (Stage 2B)   Patient has multiple comorbidities including history of LVH with congestive heart failure: She has an echocardiogram coming up next month   Treatment Plan: 1. Adjuvant Taxol with Herceptin And Perjeta  (Last echocardiogram showed an ejection fraction of 55-60%-- repeat on 4/3/2018EF 60-65%) completed 01/09/2017 2. Followed by adjuvant radiation 3. Followed by adjuvant antiestrogen  therapy with anastrozole 1 mg daily 5 years (bone density 04/29/2016 T score -1.2) ----------------------------------------------------------------------------------------------------------------------------------------------------- Current Treatment: Anastrozole 1 mg daily started 04/29/2016 Anastrozole toxicities: Denies any hot flashes or myalgias   Surveillance:  Mammogram 09/12/2021: Benign, breast density category B Breast exam: 05/21/2022: Benign     hospitalization for CSF leak at [facility] June 2021 and September 2023.  She now follows at [facility]. Thyroidectomy for goiter; detected thyroid cancer.  Currently on Synthroid 225 mg  Severe anemia: Due to heavy GI bleeding: We will obtain CBC iron studies and ferritin.  She is going to see Dr. Catalina Antigua this afternoon. Return to clinic in 1 year for follow-up    Orders Placed This Encounter  Procedures   CBC with Differential ([facility] Only)    Standing Status:   Future    Standing Expiration Date:   05/22/2023   Ferritin    Standing Status:   Future    Standing Expiration Date:   05/21/2023   Iron and Iron Binding Capacity ([facility] only)    Standing Status:   Future    Standing Expiration Date:   05/22/2023   Vitamin B12    Standing Status:   Future    Standing Expiration Date:   05/22/2023   Folate    Standing Status:   Future    Standing Expiration Date:   05/22/2023   Sample to Blood Bank    Standing Status:   Future    Standing Expiration Date:   05/22/2023   The patient has a good understanding of the overall plan. she agrees with it. she will call with any problems that may develop before the next visit here. Total time spent: 30 mins including  face to face time and time spent for planning, charting and co-ordination of care   Harriette Ohara, MD 05/21/22    I Gardiner Coins am scribing for Dr. Lindi Adie  I have reviewed the above documentation for accuracy and completeness, and I agree with the  above.

## 2022-05-21 ENCOUNTER — Inpatient Hospital Stay: Payer: Medicare Other

## 2022-05-21 ENCOUNTER — Inpatient Hospital Stay (HOSPITAL_BASED_OUTPATIENT_CLINIC_OR_DEPARTMENT_OTHER): Payer: Medicare Other | Admitting: Hematology and Oncology

## 2022-05-21 VITALS — BP 139/79 | HR 65 | Temp 97.8°F | Resp 18 | Ht 66.0 in | Wt 279.0 lb

## 2022-05-21 DIAGNOSIS — Z7951 Long term (current) use of inhaled steroids: Secondary | ICD-10-CM | POA: Diagnosis not present

## 2022-05-21 DIAGNOSIS — E785 Hyperlipidemia, unspecified: Secondary | ICD-10-CM | POA: Diagnosis not present

## 2022-05-21 DIAGNOSIS — Z86010 Personal history of colonic polyps: Secondary | ICD-10-CM | POA: Diagnosis not present

## 2022-05-21 DIAGNOSIS — Z87442 Personal history of urinary calculi: Secondary | ICD-10-CM | POA: Diagnosis not present

## 2022-05-21 DIAGNOSIS — D509 Iron deficiency anemia, unspecified: Secondary | ICD-10-CM | POA: Diagnosis not present

## 2022-05-21 DIAGNOSIS — Z170 Estrogen receptor positive status [ER+]: Secondary | ICD-10-CM

## 2022-05-21 DIAGNOSIS — R5383 Other fatigue: Secondary | ICD-10-CM | POA: Diagnosis not present

## 2022-05-21 DIAGNOSIS — I110 Hypertensive heart disease with heart failure: Secondary | ICD-10-CM | POA: Diagnosis not present

## 2022-05-21 DIAGNOSIS — I509 Heart failure, unspecified: Secondary | ICD-10-CM | POA: Diagnosis not present

## 2022-05-21 DIAGNOSIS — R531 Weakness: Secondary | ICD-10-CM | POA: Diagnosis not present

## 2022-05-21 DIAGNOSIS — R6883 Chills (without fever): Secondary | ICD-10-CM | POA: Diagnosis not present

## 2022-05-21 DIAGNOSIS — K5730 Diverticulosis of large intestine without perforation or abscess without bleeding: Secondary | ICD-10-CM | POA: Diagnosis not present

## 2022-05-21 DIAGNOSIS — C50412 Malignant neoplasm of upper-outer quadrant of left female breast: Secondary | ICD-10-CM | POA: Diagnosis not present

## 2022-05-21 DIAGNOSIS — Z7982 Long term (current) use of aspirin: Secondary | ICD-10-CM | POA: Diagnosis not present

## 2022-05-21 DIAGNOSIS — Z79811 Long term (current) use of aromatase inhibitors: Secondary | ICD-10-CM | POA: Diagnosis not present

## 2022-05-21 DIAGNOSIS — K921 Melena: Secondary | ICD-10-CM | POA: Diagnosis not present

## 2022-05-21 DIAGNOSIS — K219 Gastro-esophageal reflux disease without esophagitis: Secondary | ICD-10-CM | POA: Diagnosis not present

## 2022-05-21 DIAGNOSIS — E119 Type 2 diabetes mellitus without complications: Secondary | ICD-10-CM | POA: Diagnosis not present

## 2022-05-21 DIAGNOSIS — Z853 Personal history of malignant neoplasm of breast: Secondary | ICD-10-CM | POA: Diagnosis not present

## 2022-05-21 DIAGNOSIS — R55 Syncope and collapse: Secondary | ICD-10-CM | POA: Diagnosis not present

## 2022-05-21 DIAGNOSIS — M1990 Unspecified osteoarthritis, unspecified site: Secondary | ICD-10-CM | POA: Diagnosis not present

## 2022-05-21 DIAGNOSIS — Z800 Family history of malignant neoplasm of digestive organs: Secondary | ICD-10-CM | POA: Diagnosis not present

## 2022-05-21 DIAGNOSIS — Z794 Long term (current) use of insulin: Secondary | ICD-10-CM | POA: Diagnosis not present

## 2022-05-21 DIAGNOSIS — I2510 Atherosclerotic heart disease of native coronary artery without angina pectoris: Secondary | ICD-10-CM | POA: Diagnosis not present

## 2022-05-21 DIAGNOSIS — R42 Dizziness and giddiness: Secondary | ICD-10-CM | POA: Diagnosis not present

## 2022-05-21 DIAGNOSIS — R002 Palpitations: Secondary | ICD-10-CM | POA: Diagnosis not present

## 2022-05-21 DIAGNOSIS — Z79899 Other long term (current) drug therapy: Secondary | ICD-10-CM | POA: Diagnosis not present

## 2022-05-21 LAB — FOLATE: Folate: 9.4 ng/mL (ref >5.9)

## 2022-05-21 LAB — CBC WITH DIFFERENTIAL (CANCER CENTER ONLY)
Abs Immature Granulocytes: 0.02 10*3/uL (ref 0.00–0.07)
Basophils Absolute: 0 10*3/uL (ref 0.0–0.1)
Basophils Relative: 0 %
Eosinophils Absolute: 0.1 10*3/uL (ref 0.0–0.5)
Eosinophils Relative: 1 %
HCT: 38.9 % (ref 36.0–46.0)
Hemoglobin: 12.1 g/dL (ref 12.0–15.0)
Immature Granulocytes: 0 %
Lymphocytes Relative: 20 %
Lymphs Abs: 1.4 10*3/uL (ref 0.7–4.0)
MCH: 29.1 pg (ref 26.0–34.0)
MCHC: 31.1 g/dL (ref 30.0–36.0)
MCV: 93.5 fL (ref 80.0–100.0)
Monocytes Absolute: 0.4 10*3/uL (ref 0.1–1.0)
Monocytes Relative: 6 %
Neutro Abs: 5.2 10*3/uL (ref 1.7–7.7)
Neutrophils Relative %: 73 %
Platelet Count: 304 10*3/uL (ref 150–400)
RBC: 4.16 MIL/uL (ref 3.87–5.11)
RDW: 19.8 % — ABNORMAL HIGH (ref 11.5–15.5)
WBC Count: 7.1 10*3/uL (ref 4.0–10.5)
nRBC: 0 % (ref 0.0–0.2)

## 2022-05-21 LAB — IRON AND IRON BINDING CAPACITY (CC-WL,HP ONLY)
Iron: 87 ug/dL (ref 28–170)
Saturation Ratios: 20 % (ref 10.4–31.8)
TIBC: 428 ug/dL (ref 250–450)
UIBC: 341 ug/dL (ref 148–442)

## 2022-05-21 LAB — SAMPLE TO BLOOD BANK

## 2022-05-21 LAB — VITAMIN B12: Vitamin B-12: 162 pg/mL — ABNORMAL LOW (ref 180–914)

## 2022-05-21 LAB — FERRITIN: Ferritin: 93 ng/mL (ref 11–307)

## 2022-05-21 MED ORDER — ANASTROZOLE 1 MG PO TABS: 1.0000 mg | ORAL_TABLET | Freq: Every day | ORAL | 3 refills | Status: DC

## 2022-05-21 NOTE — Assessment & Plan Note (Signed)
06/21/16: Left lumpectomy: IDC grade 3, 1.1 cm, 1/5 LN Positive with ECE, Margins Neg, LVI Present; Er 30%, PR 0%, Ki 67 40%, Her 2 Positive Ratio 2.71; T1CN1 (Stage 2B)   Patient has multiple comorbidities including history of LVH with congestive heart failure: She has an echocardiogram coming up next month   Treatment Plan: 1. Adjuvant Taxol with Herceptin And Perjeta  (Last echocardiogram showed an ejection fraction of 55-60%-- repeat on 4/3/2018EF 60-65%) completed 01/09/2017 2. Followed by adjuvant radiation 3. Followed by adjuvant antiestrogen therapy with anastrozole 1 mg daily 5 years (bone density 04/29/2016 T score -1.2) ----------------------------------------------------------------------------------------------------------------------------------------------------- Current Treatment: Anastrozole 1 mg daily started 04/29/2016 Anastrozole toxicities: Denies any hot flashes or myalgias   Surveillance:  Mammogram 09/12/2021: Benign, breast density category B Breast exam: 05/21/2022: Benign   Severe shortness of breath to minimal exertion hospitalization for CSF leak at [facility] June 2021.  She now follows at [facility]. Thyroidectomy for goiter; detected thyroid cancer.  Currently on Synthroid 225 mg  Return to clinic in 1 year for follow-up

## 2022-05-24 ENCOUNTER — Other Ambulatory Visit: Payer: Self-pay | Admitting: Family Medicine

## 2022-05-24 ENCOUNTER — Ambulatory Visit: Payer: Self-pay

## 2022-05-24 ENCOUNTER — Other Ambulatory Visit: Payer: Self-pay | Admitting: Family

## 2022-05-24 NOTE — Patient Instructions (Signed)
Visit Information  Thank you for taking time to visit with me today. Please don't hesitate to contact me if I can be of assistance to you.   Following are the goals we discussed today:   Goals Addressed             This Visit's Progress    COMPLETED: continue to improve post repair of CSF leak from nose       Care Coordination Interventions: Discussed with patient perception of her progress post surgery RN encouraged patient to continue to attend otolonaryngology visits as scheduled Encouraged patient to contact provider with questions/concerns as needed     COMPLETED: Health management       Care Coordination Interventions: hospitalization 10/22-10/26 for lower GI Bleed Reviewed upcoming/scheduled appointments and encouraged to attend as scheduled Encouraged to contact provider with health questions or concerns Confirmed patient has transportation to appointments Provided care coordinator's contact number and encouraged to call if care coordination needs in the future.       If you are experiencing a Mental Health or [place] or need someone to talk to, please call the Suicide and Crisis Lifeline: 988  Patient verbalizes understanding of instructions and care plan provided today and agrees to view in [place]. Active MyChart status and patient understanding of how to access instructions and care plan via MyChart confirmed with patient.     Thea Silversmith, RN, MSN, BSN, [place] Coordinator [phone]

## 2022-05-24 NOTE — Patient Outreach (Signed)
  Care Coordination   Follow Up Visit Note   05/24/2022 Name: [name] MRN: [id number] DOB: [birth year]  [name] is a 71 y.o. year old female who sees Nicholas Lose, MD for primary care. I spoke with  [name] by phone today.  What matters to the patients health and wellness today?  [name] reports she is feeling better and stronger since receiving blood and iron transfusions. She reports her family came in to see her during the Thanksgiving Holiday and enjoyed that. She reports that she has someone staying with her that assist her with transportation to appointments and also church members take her to appointments. She continues to be followed by otol[facility]ology at [facility]. Per review of chart she has been released from neurosurgery. She denies any questions or concerns at this time. [name] is agreeable to case closure  Goals Addressed             This Visit's Progress    COMPLETED: continue to improve post repair of CSF leak from nose       Care Coordination Interventions: Discussed with patient perception of her progress post surgery RN encouraged patient to continue to attend otolonaryngology visits as scheduled Encouraged patient to contact provider with questions/concerns as needed     COMPLETED: Health management       Care Coordination Interventions: hospitalization 10/22-10/26 for lower GI Bleed Reviewed upcoming/scheduled appointments and encouraged to attend as scheduled Encouraged to contact provider with health questions or concerns Confirmed patient has transportation to appointments Provided care coordinator's contact number and encouraged to call if care coordination needs in the future.       SDOH assessments and interventions completed:  Yes reassessed need for transportation. Patient denies.   Care Coordination Interventions:  Yes, provided   Follow up plan: No further intervention required.   Encounter Outcome:  Pt. Visit  Completed   [name] Silversmith, RN, MSN, BSN, [place] Coordinator [phone]

## 2022-05-27 ENCOUNTER — Inpatient Hospital Stay: Payer: Medicare Other | Admitting: Family

## 2022-05-27 ENCOUNTER — Inpatient Hospital Stay: Payer: Medicare Other

## 2022-05-29 ENCOUNTER — Encounter: Payer: Medicare Other | Admitting: Pharmacist

## 2022-05-29 ENCOUNTER — Telehealth: Payer: Self-pay | Admitting: Pharmacist

## 2022-05-29 NOTE — Telephone Encounter (Signed)
  Clinical Pharmacist Practitioner  Follow Up Note   05/29/2022 Name: Alberta Lenhard MRN: [id number] DOB: 1950-09-18   Referred by: Nicholas Lose, MD Reason for referral : No chief complaint on file.  An unsuccessful telephone outreach was attempted today to follow up with Mrs. Takach regarding medication management and well as follow up on medication assistance program application for [id number] that was mailed to her 03/2022.   Follow Up Plan: The care management team will reach out to the patient again over the next 30 days.   Cherre Robins, PharmD Clinical Pharmacist [facility] [place]

## 2022-06-02 ENCOUNTER — Encounter: Payer: Self-pay | Admitting: Family Medicine

## 2022-06-03 ENCOUNTER — Other Ambulatory Visit: Payer: Self-pay | Admitting: Family Medicine

## 2022-06-03 DIAGNOSIS — R29898 Other symptoms and signs involving the musculoskeletal system: Secondary | ICD-10-CM

## 2022-06-04 NOTE — Progress Notes (Signed)
This encounter was created in error - please disregard.

## 2022-06-05 ENCOUNTER — Other Ambulatory Visit: Payer: Self-pay | Admitting: Family Medicine

## 2022-06-05 ENCOUNTER — Ambulatory Visit (INDEPENDENT_AMBULATORY_CARE_PROVIDER_SITE_OTHER): Payer: Medicare Other | Admitting: Pharmacist

## 2022-06-05 ENCOUNTER — Other Ambulatory Visit: Payer: Self-pay | Admitting: Family

## 2022-06-05 DIAGNOSIS — F419 Anxiety disorder, unspecified: Secondary | ICD-10-CM

## 2022-06-05 DIAGNOSIS — F32A Depression, unspecified: Secondary | ICD-10-CM

## 2022-06-05 DIAGNOSIS — E119 Type 2 diabetes mellitus without complications: Secondary | ICD-10-CM

## 2022-06-05 DIAGNOSIS — E039 Hypothyroidism, unspecified: Secondary | ICD-10-CM

## 2022-06-05 DIAGNOSIS — D649 Anemia, unspecified: Secondary | ICD-10-CM

## 2022-06-05 NOTE — Progress Notes (Signed)
Pharmacy Note  06/05/2022 Name: [name] MRN: [id number] DOB: Feb 05, 1951  Subjective: [name] is a 71 y.o. year old female who is a primary care patient of [name], MD. Clinical Pharmacist Practitioner referral was placed to assist with medication management.    Engaged with patient by telephone for follow up visit today.  Type 2 DM: Taking Ozempic 41m weekly, Tresiba 12 units daily and metformin ER 5028m4 tablets daily. Has not filled out patient assistance program application from [employer] Patient has about 3 months of Ozempic and [place] and [place] hand. Anxiety: She reports that buspirone is helping a lot to control anxiety. Also taking escitalopram 2069maily.  Mild Intermittent asthma:   Medication Management - reviewed med list and refill history.  Patient has not been taking ferrous fumarate since she had iron infusion 05/02/2022. She was not sure if she was suppose to restart. Last ferritin, iron and Sat ratios was improved and in normal range.    Recent Office Visits:  05/21/2022 - GI (Dr ManCollene Mareseen for iron def anemia.  05/21/2022 - Hem / Onc (Dr GudSoyla Dryerurveillance of breast cancer. Taking anastrozole 1mg27mily since 04/29/2016. [facility] with [facility] at least BID and continue nasal steroid sprays, 2 sprays in each nostril, BID. She can resume CPAP at this time.  Objective: Review of patient status, including review of consultants reports, laboratory and other test data, was performed as part of comprehensive evaluation and provision of chronic care management services.   Lab Results  Component Value Date   CREATININE 0.54 04/29/2022   CREATININE 0.65 04/24/2022   CREATININE 0.82 01/22/2022    Lab Results  Component Value Date   HGBA1C 6.3 (A) 02/14/2022       Component Value Date/Time   CHOL 120 01/15/2022 0753   TRIG 227 (H) 01/15/2022 0753   HDL 31 (L) 01/15/2022 0753   CHOLHDL 3.9  01/15/2022 0753   CHOLHDL 3 07/16/2021 1213   VLDL 42.4 (H) 07/16/2021 1213   LDLCALC 52 01/15/2022 0753   LDLCALC 67 06/02/2020 1427   LDLDIRECT 56.0 07/16/2021 1213     Clinical ASCVD: Yes  The ASCVD Risk score (Arnett DK, et al., 2019) failed to calculate for the following reasons:   The valid total cholesterol range is 130 to 320 mg/dL    BP Readings from Last 3 Encounters:  05/21/22 139/79  05/09/22 139/78  05/02/22 (!) 155/75     Allergies  Allergen Reactions   Oxycodone Rash    Does not feel good. Makes her sleepy.    Medications Reviewed Today     Reviewed by EckaCherre RobinsH-CPP (Pharmacist) on 04/30/22 at [id number] List Status: <None>   Medication Order Taking? Sig Documenting Provider Last Dose Status Informant  acetaminophen (TYLENOL) 500 MG tablet [id number] Take 1,000 mg by mouth 2 (two) times daily as needed for moderate pain or headache. [provider] Taking Active Self  albuterol (VENTOLIN HFA) 108 (90 Base) MCG/ACT inhaler [id number] Inhale 2 puffs every 4-6 hours as needed - rescue. YounDeneise Lever Taking Active   anastrozole (ARIMIDEX) 1 MG tablet [id number] Take 1 tablet (1 mg total) by mouth daily. GudeNicholas Lose Taking Active   aspirin EC 81 MG tablet [id number] Take 81 mg by mouth daily. Swallow whole. [provider] Taking Active   blood glucose meter kit and supplies [id number] based  on patient and insurance preference. Use up to four times daily as directed. (FOR ICD-10 E10.9, E11.9).  Patient not taking: Reported on 04/30/2022   [name], MD Not Taking Active   budesonide-formoterol [facility]) 160-4.5 MCG/ACT inhaler [id number] No Inhale 2 puffs into the lungs 2 (two) times daily.  Patient not taking: Reported on 04/30/2022   [provider] Not Taking Active            Med Note Antony Contras, [place] B   Fri Nov 30, 2021  4:14 PM) Approved to receive from [facility] and me program thru 06/23/2022   busPIRone (BUSPAR) 7.5 MG tablet [id number] Yes Take 1 tablet (7.5 mg total) by mouth 3 (three) times daily. As needed for anxiety Debbrah Alar, NP Taking Active   carvedilol (COREG) 25 MG tablet [id number] Yes Take 1 tablet (25 mg total) by mouth 2 (two) times daily with a meal. Debbrah Alar, NP Taking Active   diltiazem (CARDIZEM CD) 120 MG 24 hr capsule [id number] Yes Take 1 capsule (120 mg total) by mouth daily. Debbrah Alar, NP Taking Active   Docusate Calcium (STOOL SOFTENER PO) [id number] Yes Take by mouth daily as needed. [provider] Taking Active   escitalopram (LEXAPRO) 20 MG tablet [id number] Yes TAKE 1 TABLET EVERY MORNING AND 1/2 EVERY EVENING Debbrah Alar, NP Taking Active   esomeprazole (NEXIUM) 40 MG capsule [id number] Yes Take 1 capsule (40 mg total) by mouth daily. [name], MD Taking Active   ezetimibe (ZETIA) 10 MG tablet [id number] Yes Take 1 tablet (10 mg total) by mouth daily. [name], MD Taking Active   fenofibrate (TRICOR) 145 MG tablet [id number] Yes TAKE ONE TABLET ONCE DAILY [name], MD Taking Active   Ferrous Fumarate-Folic Acid (HEMOCYTE-F) 324-1 MG TABS [id number]  1 tablet po daily [name], MD  Active   fluticasone (FLONASE) 50 MCG/ACT nasal spray [id number] Yes Place 2 sprays into both nostrils daily. [name], MD Taking Active   furosemide (LASIX) 20 MG tablet [id number] Yes TAKE ONE TABLET ONCE DAILY [name], MD Taking Active   glucose blood (ACCU-CHEK AVIVA) test strip [id number] No Twice daily (please use brand that patient insurance covers)  Patient not taking: Reported on 04/30/2022   [name], MD Not Taking Active   Hypromellose (ARTIFICIAL TEARS OP) [id number] Yes Place 1 drop into both eyes daily as needed (dry eyes).  [provider] Taking Active Self  insulin degludec (TRESIBA FLEXTOUCH) 200 UNIT/ML FlexTouch Pen [id number] Yes Inject 12-20 Units into the skin daily.   Patient taking differently: Inject 6 Units into the skin daily.   Philemon Kingdom, MD Taking Active   Insulin Pen Needle 32G X 4 MM MISC [id number] Yes Use 1x a day Philemon Kingdom, MD Taking Active   levothyroxine (SYNTHROID) 200 MCG tablet [id number] Yes Take 1 tablet (200 mcg total) by mouth daily. Debbrah Alar, NP Taking Active            Med Note Tyler Pita, STACE[place] I   Tue Apr 30, 2022  1:25 PM) Dose is 250 mcg. Take with 50 mcg pill.  levothyroxine (SYNTHROID) 50 MCG tablet [id number] Yes Take 1 tablet (50 mcg total) by mouth daily before breakfast. [name], MD Taking Active            Med Note Tyler Pita, STACEY I   Tue Apr 30, 2022  1:25 PM) Dose is 250 mcg. Take with 200 mcg pill.  loratadine (CLARITIN)  10 MG tablet [id number] Yes Take 1 tablet (10 mg total) by mouth daily. [name], MD Taking Active   losartan (COZAAR) 100 MG tablet [id number] Yes TAKE ONE TABLET BY MOUTH DAILY Debbrah Alar, NP Taking Active   meclizine (ANTIVERT) 25 MG tablet [id number] Yes Take 25 mg by mouth every 6 (six) hours as needed. [provider] Taking Active   metFORMIN (GLUCOPHAGE-XR) 500 MG 24 hr tablet [id number] Yes Take 4 tablets (2,000 mg total) by mouth daily with supper. Philemon Kingdom, MD Taking Active   montelukast (SINGULAIR) 10 MG tablet [id number] Yes TAKE ONE TABLET AT BEDTIME [name], MD Taking Active   rosuvastatin (CRESTOR) 40 MG tablet [id number] Yes Take 1 tablet (40 mg total) by mouth daily. Debbrah Alar, NP Taking Active   Semaglutide, 1 MG/DOSE, (OZEMPIC, 1 MG/DOSE,) 4 MG/3ML SOPN [id number] No Inject 1 mg into the skin once a week.  Patient not taking: Reported on 04/30/2022   Philemon Kingdom, MD Not Taking Active            Med Note Antony Contras, [place] B   Fri Nov 30, 2021  4:15 PM) Approved to received from [employer] 11/30/21 thru 05/23/2022  Vitamin D, Ergocalciferol, (DRISDOL) 1.25 MG (50000 UNIT) CAPS capsule [id number] Yes Take 1 capsule  (50,000 Units total) by mouth every 7 (seven) days. [name], MD Taking Active             Patient Active Problem List   Diagnosis Date Noted   IDA (iron deficiency anemia) 04/30/2022   Lower GI bleed 04/29/2022   Allergic rhinitis 11/09/2021   Hx of papillary thyroid carcinoma 07/19/2021   Poorly controlled type 2 diabetes mellitus with circulatory disorder ([place]) 07/19/2021   Atypical chest pain 07/16/2021   Urinary incontinence 07/16/2021   Hypothyroidism 03/20/2021   Nausea 12/06/2020   Muscle cramps 12/06/2020   H/O thyroidectomy 08/30/2020   CSF leak from nose 01/13/2020   Uncontrolled type 2 diabetes mellitus with hyperglycemia ([place]) 04/14/2018   Vitamin D deficiency 03/30/2018   Dental infection 03/29/2018   Dysphagia 12/28/2017   Cough 12/28/2017   Peripheral neuropathy 09/22/2017   Lipoma 09/22/2017   Lymphedema 05/22/2017   Asthmatic bronchitis, mild intermittent, uncomplicated 67/61/9509   Breast cancer ([place]) 02/17/2017   Dysuria 02/17/2017   Insomnia 10/08/2016   Dilated aortic root ([place])    Port catheter in place 08/01/2016   Malignant neoplasm of upper-outer quadrant of left female breast ([place]) 05/14/2016   PVC's (premature ventricular contractions) 05/02/2016   Neck mass 04/21/2016   LVH (left ventricular hypertrophy) due to hypertensive disease, with heart failure ([place]) 04/18/2016   Tonsillar hypertrophy 07/07/2015   Back pain 10/02/2014   Obesity 07/11/2014   S/P right TKA 05/10/2014   S/P knee replacement 05/10/2014   Atrial tachycardia, paroxysmal 02/22/2014   Type 2 diabetes mellitus with hyperglycemia ([facility]) 12/31/2013   Chronic diastolic CHF (congestive heart failure) ([place]) 11/22/2013   CAD (coronary artery disease), native coronary artery 11/22/2013   Dyspnea on exertion 11/03/2013   Undiagnosed cardiac murmurs 09/05/2013   Preventative health care 09/05/2013   Musculoskeletal pain 11/10/2012   Vasomotor rhinitis 04/05/2012    Obstructive sleep apnea 04/05/2012   Anemia 04/05/2012   Elevated LFTs 04/05/2012   OA (osteoarthritis) of knee 04/05/2012   Hernia 10/13/2010   Hyperlipidemia 09/11/2007   Anxiety and depression 09/11/2007   Essential hypertension 09/11/2007   GERD 09/11/2007   RENAL CALCULUS 09/11/2007   RENAL CYST 09/11/2007  COLONIC POLYPS, HX OF 09/11/2007     Medication Assistance:   [name] and Ozempic obtained through [employer] medication assistance program.  Enrollment ends 05/2022; Patient has received mailing of [employer] patient assistance program application but has not completed yet. She also previously received.  Symbicort from [facility] and Me Program - patient states she has 4 inhalers and she is not using every day. Will not reapply for [place] and Me program since they will not have Symbicort available in 2024. Plan to switch patient to using generic Symbicort.    Assessment / Plan: Type 2 DM - controlled.  Continue Tresiba 12 units daily, Ozempic 44m weekly and metformin ER 5029m4 tabs daily.  Patient to complete patient assistance program application and return to office ASAP.   Anxiety - improved  Continue escitalopram daily.  Continue buspirone 7.2m21m times a day  Medication Management - reviewed med list and refill history.  Will consult with hematology office to verify if patient needs to restart ferrous fumerate or not.    Follow Up:  Telephone follow up appointment with care management team member scheduled for:  1 to 2 months   TamCherre RobinsharmD Clinical Pharmacist [facility] [phone]

## 2022-06-06 NOTE — Progress Notes (Signed)
Addendum:   Received note back from hematology regarding ferrous fumerate.  Celso Amy, NP  Cherre Robins, RPH-CPP She can as long as it doesn't bother her stomach in any way. If it causes constipation or GERD she can stop it and we will replace IV only. Let me know if you have any other questions or concerns! I see her back in January to recheck her counts. [place] Abts with patient and she will restart ferrous fumerate once daily. Endorses she has medication at home.  She understands to hold ferrous fumerate if she has an constipation or GERD symptoms. Patient will see hematology for follow up at the end January 2024.

## 2022-06-07 ENCOUNTER — Telehealth: Payer: Self-pay | Admitting: Internal Medicine

## 2022-06-11 ENCOUNTER — Other Ambulatory Visit: Payer: Self-pay

## 2022-06-11 ENCOUNTER — Ambulatory Visit (INDEPENDENT_AMBULATORY_CARE_PROVIDER_SITE_OTHER): Payer: Medicare Other | Admitting: Family

## 2022-06-11 ENCOUNTER — Telehealth: Payer: Self-pay

## 2022-06-11 ENCOUNTER — Encounter: Payer: Self-pay | Admitting: Family

## 2022-06-11 ENCOUNTER — Ambulatory Visit: Payer: Medicare Other | Attending: Family Medicine

## 2022-06-11 ENCOUNTER — Ambulatory Visit (HOSPITAL_BASED_OUTPATIENT_CLINIC_OR_DEPARTMENT_OTHER)
Admission: RE | Admit: 2022-06-11 | Discharge: 2022-06-11 | Disposition: A | Discharge status: Home / Self Care | Payer: Medicare Other | Source: Ambulatory Visit | Attending: Family Medicine | Admitting: Family Medicine

## 2022-06-11 VITALS — BP 143/74 | HR 72 | Temp 98.4°F | Resp 16 | Ht 66.0 in | Wt 277.0 lb

## 2022-06-11 DIAGNOSIS — J309 Allergic rhinitis, unspecified: Secondary | ICD-10-CM | POA: Diagnosis not present

## 2022-06-11 DIAGNOSIS — R29898 Other symptoms and signs involving the musculoskeletal system: Secondary | ICD-10-CM | POA: Diagnosis not present

## 2022-06-11 DIAGNOSIS — E559 Vitamin D deficiency, unspecified: Secondary | ICD-10-CM | POA: Diagnosis not present

## 2022-06-11 DIAGNOSIS — J45909 Unspecified asthma, uncomplicated: Secondary | ICD-10-CM | POA: Diagnosis not present

## 2022-06-11 DIAGNOSIS — M549 Dorsalgia, unspecified: Secondary | ICD-10-CM | POA: Diagnosis not present

## 2022-06-11 DIAGNOSIS — M6281 Muscle weakness (generalized): Secondary | ICD-10-CM | POA: Insufficient documentation

## 2022-06-11 DIAGNOSIS — Z85850 Personal history of malignant neoplasm of thyroid: Secondary | ICD-10-CM | POA: Diagnosis not present

## 2022-06-11 DIAGNOSIS — M545 Low back pain, unspecified: Secondary | ICD-10-CM | POA: Diagnosis not present

## 2022-06-11 DIAGNOSIS — R2681 Unsteadiness on feet: Secondary | ICD-10-CM | POA: Diagnosis not present

## 2022-06-11 DIAGNOSIS — E1165 Type 2 diabetes mellitus with hyperglycemia: Secondary | ICD-10-CM

## 2022-06-11 DIAGNOSIS — G4733 Obstructive sleep apnea (adult) (pediatric): Secondary | ICD-10-CM | POA: Diagnosis not present

## 2022-06-11 DIAGNOSIS — E890 Postprocedural hypothyroidism: Secondary | ICD-10-CM

## 2022-06-11 DIAGNOSIS — D500 Iron deficiency anemia secondary to blood loss (chronic): Secondary | ICD-10-CM

## 2022-06-11 DIAGNOSIS — I5032 Chronic diastolic (congestive) heart failure: Secondary | ICD-10-CM | POA: Diagnosis not present

## 2022-06-11 NOTE — Assessment & Plan Note (Signed)
Trying to work on using cpap more regularly but machine is planning follow up with her sleep specialist.

## 2022-06-11 NOTE — Assessment & Plan Note (Signed)
She is followed by Endocrinology.

## 2022-06-11 NOTE — Assessment & Plan Note (Signed)
Lab Results  Component Value Date   HGBA1C 6.3 (A) 02/14/2022   HGBA1C 9.1 (A) 11/05/2021   HGBA1C 8.6 (H) 07/16/2021   Lab Results  Component Value Date   LDLCALC 52 01/15/2022   CREATININE 0.54 04/29/2022   At goal- management per endo.

## 2022-06-11 NOTE — Telephone Encounter (Signed)
PT states she missed call from [name] related to this encounter. Pls call back. States her current CPAP is "yellin" and she can not sleep. TY. [phone]

## 2022-06-11 NOTE — Assessment & Plan Note (Signed)
Wt Readings from Last 3 Encounters:  06/11/22 277 lb (125.6 kg)  05/21/22 279 lb (126.6 kg)  04/30/22 279 lb 12.8 oz (126.9 kg)

## 2022-06-11 NOTE — Therapy (Signed)
OUTPATIENT PHYSICAL THERAPY LOWER EXTREMITY EVALUATION   Patient Name: [name] MRN: [id number] DOB:Sep 23, 1950, 71 y.o., female Today's Date: 06/11/2022  END OF SESSION:  PT End of Session - 06/11/22 1028     Visit Number 1    Number of Visits 12    Date for PT Re-Evaluation 08/16/22    PT Start Time 1031    PT Stop Time 1059    PT Time Calculation (min) 28 min    Activity Tolerance Patient tolerated treatment well    Behavior During Therapy [facility] for tasks assessed/performed             Past Medical History:  Diagnosis Date   Anemia 04/05/2012   Anxiety    Anxiety state 09/11/2007   Qualifier: Diagnosis of  By: Sherren Mocha RN, Dorian Pod     Asthma    Atrial tachycardia, paroxysmal    Back pain 10/02/2014   Breast cancer ([place]) 02/17/2017   CAD (coronary artery disease), native coronary artery    remote Cath with nonobstructive ASCAD with 20% mid LAD, 20% prox left circ and 20% ostial RCA   Chicken pox as a child   Chronic diastolic CHF (congestive heart failure), NYHA class 1 ([place])    Complication of anesthesia    pt states feels different in her body after anesthesia when waking up and also experiences a smell of burnt plastic for approx a wk    Constipation    Depression    Diabetes ([place])    Dilated aortic root ([place])    1m by echo 08/2020   Dysuria 02/17/2017   Elevated LFTs 04/05/2012   GERD (gastroesophageal reflux disease)    Goiter    Heart murmur    hx of one at birth    History of kidney stones    History of radiation therapy 10/31/16-12/17/16   left breast 45 Gy in 25 fractions, left breast boost 16 Gy in 8 fractions   Hx of colonic polyps    Hyperlipidemia    Hypertension    Insomnia 10/08/2016   Joint pain    Malignant neoplasm of upper-outer quadrant of left female breast ([place] 05/14/2016   not with patient   Measles as a child   Mumps as a child   OA (osteoarthritis) of knee 04/05/2012   Obesity 07/11/2014   PONV (postoperative nausea and  vomiting)    Preventative health care 09/05/2013   PVC's (premature ventricular contractions) 05/02/2016   Shortness of breath dyspnea    walking distances / climbing stairs   Sleep apnea 04/05/2012   Tinea corporis 02/23/2013   Type 2 diabetes mellitus with hyperglycemia ([place] 12/31/2013   Vasomotor rhinitis 04/05/2012   Ventral hernia    Wheezing    Past Surgical History:  Procedure Laterality Date   BREAST LUMPECTOMY Left 2017   BREAST LUMPECTOMY WITH RADIOACTIVE SEED AND SENTINEL LYMPH NODE BIOPSY Left 06/21/2016   Procedure: LEFT BREAST LUMPECTOMY WITH RADIOACTIVE SEED AND SENTINEL LYMPH NODE BIOPSY, INJECT BLUE DYE LEFT BREAST;  Surgeon: HFanny Skates MD;  Location: [facility] OR;  Service: General;  Laterality: Left;   CARDIAC CATHETERIZATION     normal coroary arteries per patient   CARDIOVASCULAR STRESS TEST     10/12/2013   CESAREAN SECTION     X 3   CHOLECYSTECTOMY     COLONOSCOPY WITH PROPOFOL N/A 03/28/2015   Procedure: COLONOSCOPY WITH PROPOFOL;  Surgeon: JJuanita Craver MD;  Location: [facility] ENDOSCOPY;  Service: Endoscopy;  Laterality: N/A;  HERNIA REPAIR  02/08/2011   ventral hernia   JOINT REPLACEMENT     bilateral   KNEE ARTHROSCOPY  05/2010   bilateral   LEFT AND RIGHT HEART CATHETERIZATION WITH CORONARY ANGIOGRAM N/A 11/08/2013   Procedure: LEFT AND RIGHT HEART CATHETERIZATION WITH CORONARY ANGIOGRAM;  Surgeon: [name], MD;  Location[facility] CATH LAB;  Service: Cardiovascular;  Laterality: N/A;   MENISCUS REPAIR  2009   MOUTH SURGERY     teeth implants   PORT-A-CATH REMOVAL N/A 01/24/2017   Procedure: REMOVAL PORT-A-CATH;  Surgeon: Fanny Skates, MD;  Location: [facility];  Service: General;  Laterality: N/A;   PORTACATH PLACEMENT Right 07/16/2016   Procedure: INSERTION PORT-A-CATH RIGHT INTERNAL JUGULAR WITH ULTRASOUND;  Surgeon: Fanny Skates, MD;  Location: [facility];  Service: General;  Laterality: Right;   REPAIR DURAL / CSF LEAK  2021   performed at [facility] in [place]  2011   left   TOTAL KNEE ARTHROPLASTY Right 05/10/2014   Procedure: RIGHT TOTAL KNEE ARTHROPLASTY;  Surgeon: Mauri Pole, MD;  Location: [facility] ORS;  Service: Orthopedics;  Laterality: Right;   TOTAL THYROIDECTOMY Bilateral 2022   TUBAL LIGATION     WISDOM TOOTH EXTRACTION  2000   Patient Active Problem List   Diagnosis Date Noted   Asthma 06/11/2022   IDA (iron deficiency anemia) 04/30/2022   Lower GI bleed 04/29/2022   Allergic rhinitis 11/09/2021   Hx of papillary thyroid carcinoma 07/19/2021   Poorly controlled type 2 diabetes mellitus with circulatory disorder ([place]) 07/19/2021   Atypical chest pain 07/16/2021   Urinary incontinence 07/16/2021   Hypothyroidism 03/20/2021   Nausea 12/06/2020   Muscle cramps 12/06/2020   H/O thyroidectomy 08/30/2020   CSF leak from nose 01/13/2020   Uncontrolled type 2 diabetes mellitus with hyperglycemia ([place]) 04/14/2018   Vitamin D deficiency 03/30/2018   Dental infection 03/29/2018   Dysphagia 12/28/2017   Cough 12/28/2017   Peripheral neuropathy 09/22/2017   Lipoma 09/22/2017   Lymphedema 05/22/2017   Asthmatic bronchitis, mild intermittent, uncomplicated 83/38/2505   Breast cancer ([place]) 02/17/2017   Dysuria 02/17/2017   Insomnia 10/08/2016   Dilated aortic root ([place])    Port catheter in place 08/01/2016   Malignant neoplasm of upper-outer quadrant of left female breast ([place]) 05/14/2016   PVC's (premature ventricular contractions) 05/02/2016   Neck mass 04/21/2016   LVH (left ventricular hypertrophy) due to hypertensive disease, with heart failure ([place]) 04/18/2016   Tonsillar hypertrophy 07/07/2015   Back pain 10/02/2014   Obesity 07/11/2014   S/P right TKA 05/10/2014   S/P knee replacement 05/10/2014   Atrial tachycardia, paroxysmal 02/22/2014   Type 2 diabetes mellitus with hyperglycemia ([place]) 12/31/2013   Chronic diastolic CHF (congestive heart failure) ([place]) 11/22/2013    CAD (coronary artery disease), native coronary artery 11/22/2013   Dyspnea on exertion 11/03/2013   Undiagnosed cardiac murmurs 09/05/2013   Preventative health care 09/05/2013   Musculoskeletal pain 11/10/2012   Vasomotor rhinitis 04/05/2012   Obstructive sleep apnea 04/05/2012   Anemia 04/05/2012   Elevated LFTs 04/05/2012   OA (osteoarthritis) of knee 04/05/2012   Hernia 10/13/2010   Hyperlipidemia 09/11/2007   Anxiety and depression 09/11/2007   Essential hypertension 09/11/2007   GERD 09/11/2007   RENAL CALCULUS 09/11/2007   RENAL CYST 09/11/2007   COLONIC POLYPS, HX OF 09/11/2007   REFERRING PROVIDER: [name], MD   REFERRING DIAG: Weakness of both lower extremities   THERAPY DIAG:  Muscle weakness (generalized)  Unsteadiness on feet  Rationale for Evaluation and Treatment: Rehabilitation  ONSET DATE: October 2023  SUBJECTIVE:   SUBJECTIVE STATEMENT: Patient reports that she went to the hospital due to a GI bleed in October 2023. She had to be admitted to the hospital, and she had to be given 2 pints of blood and iron. She notes that she wants to be able to get up and down easier and be safer.   PERTINENT HISTORY: HTN, CHF, CAD, DM, history of breast cancer, OA, anxiety, depression, lymphedema, and history of bilateral TKA's  PAIN:  Are you having pain? Yes: NPRS scale: 5/10 Pain location: left posterior hip  Pain description: sharp Aggravating factors: rolling over in bed, transfers Relieving factors: heat  PRECAUTIONS: Fall  WEIGHT BEARING RESTRICTIONS: No  FALLS:  Has patient fallen in last 6 months? Yes. Number of falls 2; she reports that she fell at her brothers in their yard and the second she was carrying groceries inside   LIVING ENVIRONMENT: Lives with: lives alone and she has a caregiver that comes once per week Lives in: House/apartment Stairs: No Has following equipment at home: None  OCCUPATION: retired  PLOF:  Independent  PATIENT GOALS: improved strength, safety, ease with transfers, and feel confident walking  NEXT MD VISIT: 06/11/22  OBJECTIVE:   COGNITION: Overall cognitive status: Within functional limits for tasks assessed     SENSATION: Patient reports rare tingling in her right shin  POSTURE: rounded shoulders, forward head, and flexed trunk   LOWER EXTREMITY ROM: WFL for activities assessed  LOWER EXTREMITY MMT:  MMT Right eval Left eval  Hip flexion 3+/5 4-/5  Hip extension    Hip abduction    Hip adduction    Hip internal rotation    Hip external rotation    Knee flexion 4/5 4/5  Knee extension 4-/5 4-/5  Ankle dorsiflexion 4-/5 4-/5  Ankle plantarflexion    Ankle inversion    Ankle eversion     (Blank rows = not tested)  FUNCTIONAL TESTS:  5 times sit to stand: 16.47 seconds w/o UE support Timed up and go (TUG): 11.29 seconds Tandem balance: firm surface   LLE leading 5 seconds  RLE leading: 12 seconds  GAIT: Assistive device utilized: None Level of assistance: Complete Independence Comments: decreased gait speed and stride length    TODAY'S TREATMENT:                                                                                                                              DATE:     PATIENT EDUCATION:  Education details: POC, prognosis, strengthening, and goals for therapy  Person educated: Patient Education method: Explanation Education comprehension: verbalized understanding  HOME EXERCISE PROGRAM:   ASSESSMENT:  CLINICAL IMPRESSION: Patient is a 71 y.o. female who was seen today for physical therapy evaluation and treatment for lower extremity weakness and unsteadiness on her feet.  She is at  an elevated fall risk as evidenced by her lower extremity weakness, 5 times sit to stand time, and her history of falling.  Recommend that she continue with skilled physical therapy to address her impairments to maximize her functional mobility and  safety.  OBJECTIVE IMPAIRMENTS: Abnormal gait, decreased activity tolerance, decreased balance, decreased mobility, difficulty walking, decreased strength, postural dysfunction, and pain.   ACTIVITY LIMITATIONS: carrying, lifting, standing, squatting, stairs, transfers, and locomotion level  PARTICIPATION LIMITATIONS: meal prep, cleaning, shopping, and community activity  PERSONAL FACTORS: Time since onset of injury/illness/exacerbation and 3+ comorbidities: HTN, CHF, CAD, DM, history of breast cancer, OA, anxiety, depression, lymphedema, and history of bilateral TKA's   are also affecting patient's functional outcome.   REHAB POTENTIAL: Good  CLINICAL DECISION MAKING: Evolving/moderate complexity  EVALUATION COMPLEXITY: Moderate   GOALS: Goals reviewed with patient? Yes  SHORT TERM GOALS: Target date: 07/02/22 Patient will be independent with her initial HEP. Baseline: Goal status: INITIAL  2.  Patient will improve her tandem stance to at least 15 seconds bilaterally for improved lower extremity stability. Baseline:  Goal status: INITIAL  3.  Patient will be able to navigate at least 4 steps with a reciprocal pattern for improved household mobility. Baseline:  Goal status: INITIAL  LONG TERM GOALS: Target date: 07/23/22  Patient will be independent with her advanced HEP. Baseline:  Goal status: INITIAL  2.  Patient will be able to walk for at least 15 minutes without being limited by fatigue. Baseline: Can only walk about 5  minutes currently. Goal status: INITIAL  3.  Patient will be able to navigate at least 8 steps with a reciprocal pattern for improved household mobility. Baseline: be able to navigate at least 8 step; currently using a step to pattern Goal status: INITIAL  4.  Patient will improve her 5 times sit to stand time to 12 seconds or less for improved lower extremity power. Baseline:  Goal status: INITIAL   PLAN:  PT FREQUENCY: 1-2x/week  PT  DURATION: 6 weeks  PLANNED INTERVENTIONS: Therapeutic exercises, Therapeutic activity, Neuromuscular re-education, Balance training, Gait training, Patient/Family education, Self Care, Stair training, and Re-evaluation  PLAN FOR NEXT SESSION: NuStep, lower extremity strengthening, and balance interventions   Darlin Coco, PT 06/11/2022, 6:19 PM

## 2022-06-11 NOTE — Assessment & Plan Note (Signed)
Lab Results  Component Value Date   WBC 7.1 05/21/2022   HGB 12.1 05/21/2022   HCT 38.9 05/21/2022   MCV 93.5 05/21/2022   PLT 304 05/21/2022   Blood count has returned to normal with IV iron infusions being managed by hematology. She has not seen any further [facility].

## 2022-06-11 NOTE — Progress Notes (Signed)
Subjective:   By signing my name below, I, Carylon Perches, attest that this documentation has been prepared under the direction and in the presence of Karie Chimera, NP 06/11/2022    Patient ID: [name], female    DOB: 1950-09-25, 71 y.o.   MRN: [id number]  Chief Complaint  Patient presents with   Anemia    Here for follow up   GI Bleeding    Was evaluated by GI    HPI Patient is in today for an office visit  Iron: Her iron levels are improving. She states that overall she feels better but her legs are slightly weak. She has been evaluated to start PT for her symptoms.   Breathing: She states that symptoms are controlled. She is currently taking 160-4.5 mcg/act of Symbicort and rarely uses her Albuterol.   Allergies: She states allergies are controlled. She takes her 10 mg of Singulair year around.   Diclofenac: She is inquiring if she can restart her diclofenac medication. She has tried Tylenol which does not alleviate her symptoms.   Mood: She reports that her mood is okay. She is currently taking 20 mg of Lexapro. She states that during her time in the ED, taking Buspar helped her symptoms significantly.  Thyroid: Her thyroid levels are normal. She is currently taking 200 mcg of Synthroid. She regularly follows up with Dr.Gherghe.  Lab Results  Component Value Date   TSH 1.96 02/14/2022   T3TOTAL 137 03/26/2018   Hardened Abdomen: She reports of a hardened abdomen. She underwent an ultrasound in her abdomen in her RU[facility] in 03/23/2021 and reports of no significant concerns.   Diet: She states that she is on a vegan diet but has been eating more quantities. She notes of indigestion when consuming beans and therefore has to take Pepcid. She has been eating vegetarian chili and vegetable soups. She states that she has been taking her 1 mg/1 mL of Ozempic two times.  Wt Readings from Last 3 Encounters:  06/11/22 277 lb (125.6 kg)  05/21/22 279 lb (126.6 kg)   04/30/22 279 lb 12.8 oz (126.9 kg)   Vitamin D: She states that she is inconsistent with taking her 1.25 mg of Drisdol.   Cpap: She is not consistently wearing her Cpap. She states that there is something wrong with her machine. She has contacted Dr.Young's office and reports that she has an appointment scheduled for 06/21/2022.  Health Maintenance Due  Topic Date Due   Diabetic kidney evaluation - Urine ACR  Never done   INFLUENZA VACCINE  01/22/2022   COVID-19 Vaccine (5 - 2023-24 season) 02/22/2022   COLONOSCOPY (Pts 45-65yr Insurance coverage will need to be confirmed)  03/27/2022    Past Medical History:  Diagnosis Date   Anemia 04/05/2012   Anxiety    Anxiety state 09/11/2007   Qualifier: Diagnosis of  By: TScherrie Gerlach    Asthma    Atrial tachycardia, paroxysmal    Back pain 10/02/2014   Breast cancer ([facility] 02/17/2017   CAD (coronary artery disease), native coronary artery    remote Cath with nonobstructive ASCAD with 20% mid LAD, 20% prox left circ and 20% ostial RCA   Chicken pox as a child   Chronic diastolic CHF (congestive heart failure), NYHA class 1 ([facility]    Complication of anesthesia    pt states feels different in her body after anesthesia when waking up and also experiences a smell of burnt plastic for approx  a wk    Constipation    Depression    Diabetes ([place])    Dilated aortic root ([place])    33m by echo 08/2020   Dysuria 02/17/2017   Elevated LFTs 04/05/2012   GERD (gastroesophageal reflux disease)    Goiter    Heart murmur    hx of one at birth    History of kidney stones    History of radiation therapy 10/31/16-12/17/16   left breast 45 Gy in 25 fractions, left breast boost 16 Gy in 8 fractions   Hx of colonic polyps    Hyperlipidemia    Hypertension    Insomnia 10/08/2016   Joint pain    Malignant neoplasm of upper-outer quadrant of left female breast ([facility] 05/14/2016   not with patient   Measles as a child   Mumps as a child   OA  (osteoarthritis) of knee 04/05/2012   Obesity 07/11/2014   PONV (postoperative nausea and vomiting)    Preventative health care 09/05/2013   PVC's (premature ventricular contractions) 05/02/2016   Shortness of breath dyspnea    walking distances / climbing stairs   Sleep apnea 04/05/2012   Tinea corporis 02/23/2013   Type 2 diabetes mellitus with hyperglycemia ([facility] 12/31/2013   Vasomotor rhinitis 04/05/2012   Ventral hernia    Wheezing     Past Surgical History:  Procedure Laterality Date   BREAST LUMPECTOMY Left 2017   BREAST LUMPECTOMY WITH RADIOACTIVE SEED AND SENTINEL LYMPH NODE BIOPSY Left 06/21/2016   Procedure: LEFT BREAST LUMPECTOMY WITH RADIOACTIVE SEED AND SENTINEL LYMPH NODE BIOPSY, INJECT BLUE DYE LEFT BREAST;  Surgeon: HFanny Skates MD;  [facility]  Service: General;  Laterality: Left;   CARDIAC CATHETERIZATION     normal coroary arteries per patient   CARDIOVASCULAR STRESS TEST     10/12/2013   CESAREAN SECTION     X 3   CHOLECYSTECTOMY     COLONOSCOPY WITH PROPOFOL N/A 03/28/2015   Procedure: COLONOSCOPY WITH PROPOFOL;  Surgeon: JJuanita Craver MD;  Location: [facility];  Service: Endoscopy;  Laterality: N/A;   HERNIA REPAIR  02/08/2011   ventral hernia   JOINT REPLACEMENT     bilateral   KNEE ARTHROSCOPY  05/2010   bilateral   LEFT AND RIGHT HEART CATHETERIZATION WITH CORONARY ANGIOGRAM N/A 11/08/2013   Procedure: LEFT AND RIGHT HEART CATHETERIZATION WITH CORONARY ANGIOGRAM;  Surgeon: CBurnell Blanks MD;  [facility];  Service: Cardiovascular;  Laterality: N/A;   MENISCUS REPAIR  2009   MOUTH SURGERY     teeth implants   PORT-A-CATH REMOVAL N/A 01/24/2017   Procedure: REMOVAL PORT-A-CATH;  Surgeon: IFanny Skates MD;  [facility]  Service: General;  Laterality: N/A;   PORTACATH PLACEMENT Right 07/16/2016   Procedure: INSERTION PORT-A-CATH RIGHT INTERNAL JUGULAR WITH ULTRASOUND;  Surgeon: HFanny Skates MD;  [facility]   Service: General;  Laterality: Right;   REPAIR DURAL / CSF LEAK  2021   [facility]   left   TOTAL KNEE ARTHROPLASTY Right 05/10/2014   Procedure: RIGHT TOTAL KNEE ARTHROPLASTY;  Surgeon: MMauri Pole MD;  Location: [facility];  Service: Orthopedics;  Laterality: Right;   TOTAL THYROIDECTOMY Bilateral 2022   TUBAL LIGATION     WISDOM TOOTH EXTRACTION  2000    Family History  Problem Relation Age of Onset   Thyroid disease Mother  hypothyroid   Hyperlipidemia Mother    Depression Mother    Anxiety disorder Mother    Heart attack Father 57   Hypertension Father    Arthritis Father        RA   Coronary artery disease Father    High Cholesterol Father    Coronary artery disease Brother    Heart disease Brother    Cancer Maternal Grandmother        colon   Heart attack Maternal Grandfather    Alcohol abuse Paternal Grandfather    Cancer Maternal Aunt        colon    Social History   Socioeconomic History   Marital status: Divorced    Spouse name: Not on file   Number of children: 3   Years of education: Not on file   Highest education level: Not on file  Occupational History   Occupation: retired - [employer]  Tobacco Use   Smoking status: Never   Smokeless tobacco: Never  Vaping Use   Vaping Use: Never used  Substance and Sexual Activity   Alcohol use: Not Currently    Comment: rarely   Drug use: No   Sexual activity: Never  Other Topics Concern   Not on file  Social History Narrative   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)   Social Determinants of Health   Financial Resource Strain: Low Risk  (08/13/2021)   Overall Financial Resource Strain (CARDIA)    Difficulty of Paying Living Expenses: Not hard at all  Food Insecurity: No Food Insecurity (02/26/2022)   Hunger Vital Sign    Worried About Running Out of Food in the Last Year: Never true    Ran Out of Food in the  Last Year: Never true  Transportation Needs: No Transportation Needs (04/24/2022)   PRAPARE - Hydrologist (Medical): No    Lack of Transportation (Non-Medical): No  Physical Activity: Insufficiently Active (09/21/2021)   Exercise Vital Sign    Days of Exercise per Week: 4 days    Minutes of Exercise per Session: 10 min  Stress: No Stress Concern Present (08/13/2021)   [place]    Feeling of Stress : Not at all  Social Connections: Moderately Isolated (08/13/2021)   Social Connection and Isolation Panel [NHANES]    Frequency of Communication with Friends and Family: More than three times a week    Frequency of Social Gatherings with Friends and Family: More than three times a week    Attends Religious Services: More than 4 times per year    Active Member of [employer] or Organizations: No    Attends Archivist Meetings: Never    Marital Status: Divorced  Human resources officer Violence: Not At Risk (08/13/2021)   Humiliation, Afraid, Rape, and Kick questionnaire    Fear of Current or Ex-Partner: No    Emotionally Abused: No    Physically Abused: No    Sexually Abused: No    Outpatient Medications Prior to Visit  Medication Sig Dispense Refill   acetaminophen (TYLENOL) 500 MG tablet Take 1,000 mg by mouth 2 (two) times daily as needed for moderate pain or headache.     albuterol (VENTOLIN HFA) 108 (90 Base) MCG/ACT inhaler Inhale 2 puffs every 4-6 hours as needed - rescue. 18 g 12   anastrozole (ARIMIDEX) 1 MG tablet Take 1 tablet (1 mg total) by mouth daily. 90 tablet  3   aspirin EC 81 MG tablet Take 81 mg by mouth daily. Swallow whole.     blood glucose meter kit and supplies Dispense based on patient and insurance preference. Use up to four times daily as directed. (FOR ICD-10 E10.9, E11.9). 1 each 0   budesonide-formoterol (SYMBICORT) 160-4.5 MCG/ACT inhaler Inhale 2 puffs into the lungs 2  (two) times daily.     busPIRone (BUSPAR) 7.5 MG tablet Take 1 tablet (7.5 mg total) by mouth 3 (three) times daily. As needed for anxiety 90 tablet 5   carvedilol (COREG) 25 MG tablet Take 1 tablet (25 mg total) by mouth 2 (two) times daily with a meal. 180 tablet 1   diltiazem (CARDIZEM CD) 120 MG 24 hr capsule TAKE ONE CAPSULE BY MOUTH DAILY 90 capsule 1   Docusate Calcium (STOOL SOFTENER PO) Take by mouth daily as needed.     escitalopram (LEXAPRO) 20 MG tablet TAKE ONE TABLET BY MOUTH EVERY MORNING AND ONE-HALF TABLET EVERY EVENING 135 tablet 1   esomeprazole (NEXIUM) 40 MG capsule Take 1 capsule (40 mg total) by mouth daily. 90 capsule 3   ezetimibe (ZETIA) 10 MG tablet Take 1 tablet (10 mg total) by mouth daily. 90 tablet 3   fenofibrate (TRICOR) 145 MG tablet TAKE ONE TABLET ONCE DAILY 90 tablet 1   Ferrous Fumarate-Folic Acid (HEMOCYTE-F) 324-1 MG TABS 1 tablet po daily 30 tablet 3   fluticasone (FLONASE) 50 MCG/ACT nasal spray Place 2 sprays into both nostrils daily. 16 g 6   furosemide (LASIX) 20 MG tablet TAKE ONE TABLET ONCE DAILY 90 tablet 0   glucose blood (ACCU-CHEK AVIVA) test strip Twice daily (please use brand that patient insurance covers) 100 each 5   Hypromellose (ARTIFICIAL TEARS OP) Place 1 drop into both eyes daily as needed (dry eyes).      insulin degludec (TRESIBA FLEXTOUCH) 200 UNIT/ML FlexTouch Pen Inject 12-20 Units into the skin daily. (Patient taking differently: Inject 12 Units into the skin daily.) 9 mL 3   Insulin Pen Needle 32G X 4 MM MISC Use 1x a day 100 each 3   levothyroxine (SYNTHROID) 200 MCG tablet Take 1 tablet (200 mcg total) by mouth daily. 90 tablet 1   levothyroxine (SYNTHROID) 50 MCG tablet TAKE ONE TABLET ONCE DAILY 90 tablet 0   loratadine (CLARITIN) 10 MG tablet Take 1 tablet (10 mg total) by mouth daily. 30 tablet 11   losartan (COZAAR) 100 MG tablet TAKE ONE TABLET DAILY 90 tablet 0   metFORMIN (GLUCOPHAGE-XR) 500 MG 24 hr tablet Take 4  tablets (2,000 mg total) by mouth daily with supper. 360 tablet 3   montelukast (SINGULAIR) 10 MG tablet TAKE ONE TABLET AT BEDTIME 90 tablet 1   rosuvastatin (CRESTOR) 40 MG tablet TAKE ONE TABLET DAILY 90 tablet 1   Semaglutide, 1 MG/DOSE, (OZEMPIC, 1 MG/DOSE,) 4 MG/3ML SOPN Inject 1 mg into the skin once a week. 9 mL 3   Vitamin D, Ergocalciferol, (DRISDOL) 1.25 MG (50000 UNIT) CAPS capsule Take 1 capsule (50,000 Units total) by mouth every 7 (seven) days. 4 capsule 4   No facility-administered medications prior to visit.    Allergies  Allergen Reactions   Oxycodone Rash    Does not feel good. Makes her sleepy.    ROS     Objective:    Physical Exam Constitutional:      General: She is not in acute distress.    Appearance: Normal appearance. She is not ill-appearing.  HENT:     Head: Normocephalic and atraumatic.     Right Ear: External ear normal.     Left Ear: External ear normal.  Eyes:     Extraocular Movements: Extraocular movements intact.     Pupils: Pupils are equal, round, and reactive to light.  Cardiovascular:     Rate and Rhythm: Normal rate and regular rhythm.     Heart sounds: Normal heart sounds. No murmur heard.    No gallop.  Pulmonary:     Effort: Pulmonary effort is normal. No respiratory distress.     Breath sounds: Normal breath sounds. No wheezing or rales.  Skin:    General: Skin is warm and dry.  Neurological:     Mental Status: She is alert and oriented to person, place, and time.  Psychiatric:        Mood and Affect: Mood normal.        Behavior: Behavior normal.        Judgment: Judgment normal.     BP (!) 143/74 (BP Location: Right Arm, Patient Position: Sitting, Cuff Size: Large)   Pulse 72   Temp 98.4 F (36.9 C) (Oral)   Resp 16   Ht _0  (1.676 m)   Wt 277 lb (125.6 kg)   BMI 44.71 kg/m  Wt Readings from Last 3 Encounters:  06/11/22 277 lb (125.6 kg)  05/21/22 279 lb (126.6 kg)  04/30/22 279 lb 12.8 oz (126.9 kg)        Assessment & Plan:   Problem List Items Addressed This Visit       Unprioritized   Vitamin D deficiency   Relevant Orders   Vitamin D (25 hydroxy)   Type 2 diabetes mellitus with hyperglycemia ([place])    Lab Results  Component Value Date   HGBA1C 6.3 (A) 02/14/2022   HGBA1C 9.1 (A) 11/05/2021   HGBA1C 8.6 (H) 07/16/2021   Lab Results  Component Value Date   LDLCALC 52 01/15/2022   CREATININE 0.54 04/29/2022  At goal- management per endo.       Obstructive sleep apnea    Trying to work on using cpap more regularly but machine is planning follow up with her sleep specialist.       IDA (iron deficiency anemia)    Lab Results  Component Value Date   WBC 7.1 05/21/2022   HGB 12.1 05/21/2022   HCT 38.9 05/21/2022   MCV 93.5 05/21/2022   PLT 304 05/21/2022  Blood count has returned to normal with IV iron infusions being managed by hematology. She has not seen any further BRBPR.       Hypothyroidism    Lab Results  Component Value Date   TSH 1.96 02/14/2022  Stable on synthroid- followed by endo.      Hx of papillary thyroid carcinoma    She is followed by Endocrinology.       Chronic diastolic CHF (congestive heart failure) ([facility])    Wt Readings from Last 3 Encounters:  06/11/22 277 lb (125.6 kg)  05/21/22 279 lb (126.6 kg)  04/30/22 279 lb 12.8 oz (126.9 kg)        Back pain    Will start PT.       Asthma    Stable on symbicort.       Allergic rhinitis - Primary    Stable on singulair.       No orders of the defined types were placed in this encounter.   I, Baljit Liebert  Rowe Pavy, NP, personally preformed the services described in this documentation.  All medical record entries made by the scribe were at my direction and in my presence.  I have reviewed the chart and discharge instructions (if applicable) and agree that the record reflects my personal performance and is accurate and complete. 06/11/2022   I,Amber Collins,acting as a scribe for  Nance Pear, NP.,have documented all relevant documentation on the behalf of Nance Pear, NP,as directed by  Nance Pear, NP while in the presence of Nance Pear, NP.    Nance Pear, NP

## 2022-06-11 NOTE — Telephone Encounter (Signed)
Called the pt and there was no answer- LMTCB    

## 2022-06-11 NOTE — Telephone Encounter (Signed)
Spoke with pt and she states her CPAP machine is making noise and she needs a new machine. I informed her she will need an office visit in order to receive a new CPAP. And she has an ov with Dr Annamaria Boots 06/21/2022. I informed pt I'll call [employer] for her to have them troubleshoot her machine with her. Pt verbalized understanding. Nothing further needed.

## 2022-06-11 NOTE — Telephone Encounter (Signed)
Reached out to [employer] and a rep informed me someone from the [facility] office will be giving [name] a call to troubleshoot her CPAP machine with her. Nothing further needed.

## 2022-06-11 NOTE — Assessment & Plan Note (Signed)
Stable on singulair.  

## 2022-06-11 NOTE — Assessment & Plan Note (Addendum)
Stable on symbicort.

## 2022-06-11 NOTE — Assessment & Plan Note (Addendum)
Lab Results  Component Value Date   TSH 1.96 02/14/2022   Stable on synthroid- followed by endo.

## 2022-06-11 NOTE — Assessment & Plan Note (Signed)
Will start PT.

## 2022-06-12 DIAGNOSIS — J329 Chronic sinusitis, unspecified: Secondary | ICD-10-CM | POA: Diagnosis not present

## 2022-06-14 ENCOUNTER — Encounter: Payer: Self-pay | Admitting: Physical Therapy

## 2022-06-14 ENCOUNTER — Ambulatory Visit: Payer: Medicare Other | Admitting: Physical Therapy

## 2022-06-14 DIAGNOSIS — G4733 Obstructive sleep apnea (adult) (pediatric): Secondary | ICD-10-CM | POA: Diagnosis not present

## 2022-06-14 DIAGNOSIS — M6281 Muscle weakness (generalized): Secondary | ICD-10-CM

## 2022-06-14 DIAGNOSIS — R2681 Unsteadiness on feet: Secondary | ICD-10-CM | POA: Diagnosis not present

## 2022-06-14 DIAGNOSIS — R29898 Other symptoms and signs involving the musculoskeletal system: Secondary | ICD-10-CM | POA: Diagnosis not present

## 2022-06-14 NOTE — Therapy (Signed)
OUTPATIENT PHYSICAL THERAPY LOWER EXTREMITY EVALUATION   Patient Name: [name] MRN: [id number] DOB:04-06-1951, 71 y.o., female Today's Date: 06/14/2022  END OF SESSION:  PT End of Session - 06/14/22 0957     Visit Number 2    Number of Visits 12    Date for PT Re-Evaluation 08/16/22    PT Start Time 0946    PT Stop Time 7253    PT Time Calculation (min) 46 min    Activity Tolerance Patient tolerated treatment well    Behavior During Therapy [facility] for tasks assessed/performed            Past Medical History:  Diagnosis Date   Anemia 04/05/2012   Anxiety    Anxiety state 09/11/2007   Qualifier: Diagnosis of  By: Sherren Mocha RN, Dorian Pod     Asthma    Atrial tachycardia, paroxysmal    Back pain 10/02/2014   Breast cancer ([place]) 02/17/2017   CAD (coronary artery disease), native coronary artery    remote Cath with nonobstructive ASCAD with 20% mid LAD, 20% prox left circ and 20% ostial RCA   Chicken pox as a child   Chronic diastolic CHF (congestive heart failure), NYHA class 1 ([place])    Complication of anesthesia    pt states feels different in her body after anesthesia when waking up and also experiences a smell of burnt plastic for approx a wk    Constipation    Depression    Diabetes ([place])    Dilated aortic root ([place])    49m by echo 08/2020   Dysuria 02/17/2017   Elevated LFTs 04/05/2012   GERD (gastroesophageal reflux disease)    Goiter    Heart murmur    hx of one at birth    History of kidney stones    History of radiation therapy 10/31/16-12/17/16   left breast 45 Gy in 25 fractions, left breast boost 16 Gy in 8 fractions   Hx of colonic polyps    Hyperlipidemia    Hypertension    Insomnia 10/08/2016   Joint pain    Malignant neoplasm of upper-outer quadrant of left female breast ([place] 05/14/2016   not with patient   Measles as a child   Mumps as a child   OA (osteoarthritis) of knee 04/05/2012   Obesity 07/11/2014   PONV (postoperative nausea and  vomiting)    Preventative health care 09/05/2013   PVC's (premature ventricular contractions) 05/02/2016   Shortness of breath dyspnea    walking distances / climbing stairs   Sleep apnea 04/05/2012   Tinea corporis 02/23/2013   Type 2 diabetes mellitus with hyperglycemia ([place] 12/31/2013   Vasomotor rhinitis 04/05/2012   Ventral hernia    Wheezing    Past Surgical History:  Procedure Laterality Date   BREAST LUMPECTOMY Left 2017   BREAST LUMPECTOMY WITH RADIOACTIVE SEED AND SENTINEL LYMPH NODE BIOPSY Left 06/21/2016   Procedure: LEFT BREAST LUMPECTOMY WITH RADIOACTIVE SEED AND SENTINEL LYMPH NODE BIOPSY, INJECT BLUE DYE LEFT BREAST;  Surgeon: HFanny Skates MD;  [facility];  Service: General;  Laterality: Left;   CARDIAC CATHETERIZATION     normal coroary arteries per patient   CARDIOVASCULAR STRESS TEST     10/12/2013   CESAREAN SECTION     X 3   CHOLECYSTECTOMY     COLONOSCOPY WITH PROPOFOL N/A 03/28/2015   Procedure: COLONOSCOPY WITH PROPOFOL;  Surgeon: JJuanita Craver MD;  [facility];  Service: Endoscopy;  Laterality: N/A;   HERNIA  REPAIR  02/08/2011   ventral hernia   JOINT REPLACEMENT     bilateral   KNEE ARTHROSCOPY  05/2010   bilateral   LEFT AND RIGHT HEART CATHETERIZATION WITH CORONARY ANGIOGRAM N/A 11/08/2013   Procedure: LEFT AND RIGHT HEART CATHETERIZATION WITH CORONARY ANGIOGRAM;  Surgeon: [name], MD;  Location: [facility] CATH LAB;  Service: Cardiovascular;  Laterality: N/A;   MENISCUS REPAIR  2009   MOUTH SURGERY     teeth implants   PORT-A-CATH REMOVAL N/A 01/24/2017   Procedure: REMOVAL PORT-A-CATH;  Surgeon: Fanny Skates, MD;  Location: [place];  Service: General;  Laterality: N/A;   PORTACATH PLACEMENT Right 07/16/2016   Procedure: INSERTION PORT-A-CATH RIGHT INTERNAL JUGULAR WITH ULTRASOUND;  Surgeon: Fanny Skates, MD;  Location: [place];  Service: General;  Laterality: Right;   REPAIR DURAL / CSF LEAK  2021   performed at [facility] in [place]  2011   left   TOTAL KNEE ARTHROPLASTY Right 05/10/2014   Procedure: RIGHT TOTAL KNEE ARTHROPLASTY;  Surgeon: Mauri Pole, MD;  [facility];  Service: Orthopedics;  Laterality: Right;   TOTAL THYROIDECTOMY Bilateral 2022   TUBAL LIGATION     WISDOM TOOTH EXTRACTION  2000   Patient Active Problem List   Diagnosis Date Noted   Asthma 06/11/2022   IDA (iron deficiency anemia) 04/30/2022   Lower GI bleed 04/29/2022   Allergic rhinitis 11/09/2021   Hx of papillary thyroid carcinoma 07/19/2021   Poorly controlled type 2 diabetes mellitus with circulatory disorder ([place]) 07/19/2021   Atypical chest pain 07/16/2021   Urinary incontinence 07/16/2021   Hypothyroidism 03/20/2021   Nausea 12/06/2020   Muscle cramps 12/06/2020   H/O thyroidectomy 08/30/2020   CSF leak from nose 01/13/2020   Uncontrolled type 2 diabetes mellitus with hyperglycemia ([place]) 04/14/2018   Vitamin D deficiency 03/30/2018   Dental infection 03/29/2018   Dysphagia 12/28/2017   Cough 12/28/2017   Peripheral neuropathy 09/22/2017   Lipoma 09/22/2017   Lymphedema 05/22/2017   Asthmatic bronchitis, mild intermittent, uncomplicated 03/00/9233   Breast cancer ([place]) 02/17/2017   Dysuria 02/17/2017   Insomnia 10/08/2016   Dilated aortic root ([place])    Port catheter in place 08/01/2016   Malignant neoplasm of upper-outer quadrant of left female breast ([place]) 05/14/2016   PVC's (premature ventricular contractions) 05/02/2016   Neck mass 04/21/2016   LVH (left ventricular hypertrophy) due to hypertensive disease, with heart failure ([place]) 04/18/2016   Tonsillar hypertrophy 07/07/2015   Back pain 10/02/2014   Obesity 07/11/2014   S/P right TKA 05/10/2014   S/P knee replacement 05/10/2014   Atrial tachycardia, paroxysmal 02/22/2014   Type 2 diabetes mellitus with hyperglycemia ([place]) 12/31/2013   Chronic diastolic CHF (congestive heart failure) ([place]) 11/22/2013    CAD (coronary artery disease), native coronary artery 11/22/2013   Dyspnea on exertion 11/03/2013   Undiagnosed cardiac murmurs 09/05/2013   Preventative health care 09/05/2013   Musculoskeletal pain 11/10/2012   Vasomotor rhinitis 04/05/2012   Obstructive sleep apnea 04/05/2012   Anemia 04/05/2012   Elevated LFTs 04/05/2012   OA (osteoarthritis) of knee 04/05/2012   Hernia 10/13/2010   Hyperlipidemia 09/11/2007   Anxiety and depression 09/11/2007   Essential hypertension 09/11/2007   GERD 09/11/2007   RENAL CALCULUS 09/11/2007   RENAL CYST 09/11/2007   COLONIC POLYPS, HX OF 09/11/2007   REFERRING PROVIDER: [name], MD   REFERRING DIAG: Weakness of both lower extremities   THERAPY DIAG:  Muscle weakness (generalized)  Unsteadiness on feet  Rationale for Evaluation and Treatment: Rehabilitation  ONSET DATE: October 2023  SUBJECTIVE:   SUBJECTIVE STATEMENT:  Reports she's trying to get a HA and the bottom of her feet at hurting some.  PERTINENT HISTORY: HTN, CHF, CAD, DM, history of breast cancer, OA, anxiety, depression, lymphedema, and history of bilateral TKA's   PAIN:  Are you having pain? Yes: NPRS scale: 5/10 Pain location: left posterior hip  Pain description: sharp Aggravating factors: rolling over in bed, transfers Relieving factors: heat  PRECAUTIONS: Fall  NEXT MD VISIT: 06/11/22  OBJECTIVE:   LOWER EXTREMITY MMT:  MMT Right eval Left eval  Hip flexion 3+/5 4-/5  Hip extension    Hip abduction    Hip adduction    Hip internal rotation    Hip external rotation    Knee flexion 4/5 4/5  Knee extension 4-/5 4-/5  Ankle dorsiflexion 4-/5 4-/5  Ankle plantarflexion    Ankle inversion    Ankle eversion     (Blank rows = not tested)  TODAY'S TREATMENT:                                                                                                                              DATE:                                      EXERCISE  LOG  Exercise Repetitions and Resistance Comments  Nustep L3 x17 min   LAQ 3# BLE x20 reps   HS curl Red theraband x20 reps   Clam Red theraband x20 reps   Hip flexion  Standing x20 reps   Heel/toe raise Standing x30 reps    Blank cell = exercise not performed today   PATIENT EDUCATION:  Education details: POC, prognosis, strengthening, and goals for therapy  Person educated: Patient Education method: Explanation Education comprehension: verbalized understanding  HOME EXERCISE PROGRAM:  ASSESSMENT:  CLINICAL IMPRESSION: Patient presented in clinic with reports of mild HA beginning as well as plantar surface of feet discomfort. Patient states that she is normally not as active this early in the day. No complaints during today's session. Patient reported soreness following end of session and educated to monitor her tolerance until next visit.  OBJECTIVE IMPAIRMENTS: Abnormal gait, decreased activity tolerance, decreased balance, decreased mobility, difficulty walking, decreased strength, postural dysfunction, and pain.   ACTIVITY LIMITATIONS: carrying, lifting, standing, squatting, stairs, transfers, and locomotion level  PARTICIPATION LIMITATIONS: meal prep, cleaning, shopping, and community activity  PERSONAL FACTORS: Time since onset of injury/illness/exacerbation and 3+ comorbidities: HTN, CHF, CAD, DM, history of breast cancer, OA, anxiety, depression, lymphedema, and history of bilateral TKA's   are also affecting patient's functional outcome.   REHAB POTENTIAL: Good  CLINICAL DECISION MAKING: Evolving/moderate complexity  EVALUATION COMPLEXITY: Moderate  GOALS: Goals reviewed with patient? Yes  SHORT  TERM GOALS: Target date: 07/02/22 Patient will be independent with her initial HEP. Baseline: Goal status: INITIAL  2.  Patient will improve her tandem stance to at least 15 seconds bilaterally for improved lower extremity stability. Baseline:  Goal status:  INITIAL  3.  Patient will be able to navigate at least 4 steps with a reciprocal pattern for improved household mobility. Baseline:  Goal status: INITIAL  LONG TERM GOALS: Target date: 07/23/22  Patient will be independent with her advanced HEP. Baseline:  Goal status: INITIAL  2.  Patient will be able to walk for at least 15 minutes without being limited by fatigue. Baseline: Can only walk about 5  minutes currently. Goal status: INITIAL  3.  Patient will be able to navigate at least 8 steps with a reciprocal pattern for improved household mobility. Baseline: be able to navigate at least 8 step; currently using a step to pattern Goal status: INITIAL  4.  Patient will improve her 5 times sit to stand time to 12 seconds or less for improved lower extremity power. Baseline:  Goal status: INITIAL  PLAN:  PT FREQUENCY: 1-2x/week  PT DURATION: 6 weeks  PLANNED INTERVENTIONS: Therapeutic exercises, Therapeutic activity, Neuromuscular re-education, Balance training, Gait training, Patient/Family education, Self Care, Stair training, and Re-evaluation  PLAN FOR NEXT SESSION: NuStep, lower extremity strengthening, and balance interventions   Standley Brooking, PTA 06/14/2022, 10:40 AM

## 2022-06-18 ENCOUNTER — Encounter: Payer: Self-pay | Admitting: *Deleted

## 2022-06-18 ENCOUNTER — Ambulatory Visit: Payer: Medicare Other | Admitting: *Deleted

## 2022-06-18 DIAGNOSIS — R2681 Unsteadiness on feet: Secondary | ICD-10-CM

## 2022-06-18 DIAGNOSIS — M6281 Muscle weakness (generalized): Secondary | ICD-10-CM | POA: Diagnosis not present

## 2022-06-18 DIAGNOSIS — R29898 Other symptoms and signs involving the musculoskeletal system: Secondary | ICD-10-CM | POA: Diagnosis not present

## 2022-06-18 NOTE — Therapy (Signed)
OUTPATIENT PHYSICAL THERAPY LOWER EXTREMITY EVALUATION   Patient Name: [name] MRN: [id number] DOB:June 22, 1951, 71 y.o., female Today's Date: 06/18/2022  END OF SESSION:  PT End of Session - 06/18/22 0949     Visit Number 3    Number of Visits 12    Date for PT Re-Evaluation 08/16/22    PT Start Time 0945    PT Stop Time 1031    PT Time Calculation (min) 46 min            Past Medical History:  Diagnosis Date   Anemia 04/05/2012   Anxiety    Anxiety state 09/11/2007   Qualifier: Diagnosis of  By: Sherren Mocha RN, Dorian Pod     Asthma    Atrial tachycardia, paroxysmal    Back pain 10/02/2014   Breast cancer ([place]) 02/17/2017   CAD (coronary artery disease), native coronary artery    remote Cath with nonobstructive ASCAD with 20% mid LAD, 20% prox left circ and 20% ostial RCA   Chicken pox as a child   Chronic diastolic CHF (congestive heart failure), NYHA class 1 ([place])    Complication of anesthesia    pt states feels different in her body after anesthesia when waking up and also experiences a smell of burnt plastic for approx a wk    Constipation    Depression    Diabetes ([place])    Dilated aortic root ([place])    49m by echo 08/2020   Dysuria 02/17/2017   Elevated LFTs 04/05/2012   GERD (gastroesophageal reflux disease)    Goiter    Heart murmur    hx of one at birth    History of kidney stones    History of radiation therapy 10/31/16-12/17/16   left breast 45 Gy in 25 fractions, left breast boost 16 Gy in 8 fractions   Hx of colonic polyps    Hyperlipidemia    Hypertension    Insomnia 10/08/2016   Joint pain    Malignant neoplasm of upper-outer quadrant of left female breast ([place] 05/14/2016   not with patient   Measles as a child   Mumps as a child   OA (osteoarthritis) of knee 04/05/2012   Obesity 07/11/2014   PONV (postoperative nausea and vomiting)    Preventative health care 09/05/2013   PVC's (premature ventricular contractions) 05/02/2016   Shortness of  breath dyspnea    walking distances / climbing stairs   Sleep apnea 04/05/2012   Tinea corporis 02/23/2013   Type 2 diabetes mellitus with hyperglycemia ([place] 12/31/2013   Vasomotor rhinitis 04/05/2012   Ventral hernia    Wheezing    Past Surgical History:  Procedure Laterality Date   BREAST LUMPECTOMY Left 2017   BREAST LUMPECTOMY WITH RADIOACTIVE SEED AND SENTINEL LYMPH NODE BIOPSY Left 06/21/2016   Procedure: LEFT BREAST LUMPECTOMY WITH RADIOACTIVE SEED AND SENTINEL LYMPH NODE BIOPSY, INJECT BLUE DYE LEFT BREAST;  Surgeon: HFanny Skates MD;  [facility];  Service: General;  Laterality: Left;   CARDIAC CATHETERIZATION     normal coroary arteries per patient   CARDIOVASCULAR STRESS TEST     10/12/2013   CESAREAN SECTION     X 3   CHOLECYSTECTOMY     COLONOSCOPY WITH PROPOFOL N/A 03/28/2015   Procedure: COLONOSCOPY WITH PROPOFOL;  Surgeon: JJuanita Craver MD;  [facility];  Service: Endoscopy;  Laterality: N/A;   HERNIA REPAIR  02/08/2011   ventral hernia   JOINT REPLACEMENT     bilateral   KNEE  ARTHROSCOPY  05/2010   bilateral   LEFT AND RIGHT HEART CATHETERIZATION WITH CORONARY ANGIOGRAM N/A 11/08/2013   Procedure: LEFT AND RIGHT HEART CATHETERIZATION WITH CORONARY ANGIOGRAM;  Surgeon: [name], MD;  Location: [facility] CATH LAB;  Service: Cardiovascular;  Laterality: N/A;   MENISCUS REPAIR  2009   MOUTH SURGERY     teeth implants   PORT-A-CATH REMOVAL N/A 01/24/2017   Procedure: REMOVAL PORT-A-CATH;  Surgeon: Fanny Skates, MD;  Location: [place];  Service: General;  Laterality: N/A;   PORTACATH PLACEMENT Right 07/16/2016   Procedure: INSERTION PORT-A-CATH RIGHT INTERNAL JUGULAR WITH ULTRASOUND;  Surgeon: Fanny Skates, MD;  Location: [place];  Service: General;  Laterality: Right;   REPAIR DURAL / CSF LEAK  2021   performed at [facility] in [place]  2011   left   TOTAL KNEE ARTHROPLASTY Right 05/10/2014   Procedure:  RIGHT TOTAL KNEE ARTHROPLASTY;  Surgeon: Mauri Pole, MD;  [facility];  Service: Orthopedics;  Laterality: Right;   TOTAL THYROIDECTOMY Bilateral 2022   TUBAL LIGATION     WISDOM TOOTH EXTRACTION  2000   Patient Active Problem List   Diagnosis Date Noted   Asthma 06/11/2022   IDA (iron deficiency anemia) 04/30/2022   Lower GI bleed 04/29/2022   Allergic rhinitis 11/09/2021   Hx of papillary thyroid carcinoma 07/19/2021   Poorly controlled type 2 diabetes mellitus with circulatory disorder ([place]) 07/19/2021   Atypical chest pain 07/16/2021   Urinary incontinence 07/16/2021   Hypothyroidism 03/20/2021   Nausea 12/06/2020   Muscle cramps 12/06/2020   H/O thyroidectomy 08/30/2020   CSF leak from nose 01/13/2020   Uncontrolled type 2 diabetes mellitus with hyperglycemia ([place]) 04/14/2018   Vitamin D deficiency 03/30/2018   Dental infection 03/29/2018   Dysphagia 12/28/2017   Cough 12/28/2017   Peripheral neuropathy 09/22/2017   Lipoma 09/22/2017   Lymphedema 05/22/2017   Asthmatic bronchitis, mild intermittent, uncomplicated 84/69/6295   Breast cancer ([place]) 02/17/2017   Dysuria 02/17/2017   Insomnia 10/08/2016   Dilated aortic root ([place])    Port catheter in place 08/01/2016   Malignant neoplasm of upper-outer quadrant of left female breast ([place]) 05/14/2016   PVC's (premature ventricular contractions) 05/02/2016   Neck mass 04/21/2016   LVH (left ventricular hypertrophy) due to hypertensive disease, with heart failure ([place]) 04/18/2016   Tonsillar hypertrophy 07/07/2015   Back pain 10/02/2014   Obesity 07/11/2014   S/P right TKA 05/10/2014   S/P knee replacement 05/10/2014   Atrial tachycardia, paroxysmal 02/22/2014   Type 2 diabetes mellitus with hyperglycemia ([place]) 12/31/2013   Chronic diastolic CHF (congestive heart failure) ([place]) 11/22/2013   CAD (coronary artery disease), native coronary artery 11/22/2013   Dyspnea on exertion 11/03/2013   Undiagnosed cardiac  murmurs 09/05/2013   Preventative health care 09/05/2013   Musculoskeletal pain 11/10/2012   Vasomotor rhinitis 04/05/2012   Obstructive sleep apnea 04/05/2012   Anemia 04/05/2012   Elevated LFTs 04/05/2012   OA (osteoarthritis) of knee 04/05/2012   Hernia 10/13/2010   Hyperlipidemia 09/11/2007   Anxiety and depression 09/11/2007   Essential hypertension 09/11/2007   GERD 09/11/2007   RENAL CALCULUS 09/11/2007   RENAL CYST 09/11/2007   COLONIC POLYPS, HX OF 09/11/2007   REFERRING PROVIDER: [name], MD   REFERRING DIAG: Weakness of both lower extremities   THERAPY DIAG:  Muscle weakness (generalized)  Unsteadiness on feet  Rationale for Evaluation and Treatment: Rehabilitation  ONSET DATE: October 2023  SUBJECTIVE:   SUBJECTIVE STATEMENT:   Did okay after last Rx. RT knee hurts today. Sciatica LT side.  PERTINENT HISTORY: HTN, CHF, CAD, DM, history of breast cancer, OA, anxiety, depression, lymphedema, and history of bilateral TKA's   PAIN:  Are you having pain? Yes: NPRS scale: 5/10 Pain location: left posterior hip  Pain description: sharp Aggravating factors: rolling over in bed, transfers Relieving factors: heat  PRECAUTIONS: Fall  NEXT MD VISIT: 06/11/22  OBJECTIVE:   LOWER EXTREMITY MMT:  MMT Right eval Left eval  Hip flexion 3+/5 4-/5  Hip extension    Hip abduction    Hip adduction    Hip internal rotation    Hip external rotation    Knee flexion 4/5 4/5  Knee extension 4-/5 4-/5  Ankle dorsiflexion 4-/5 4-/5  Ankle plantarflexion    Ankle inversion    Ankle eversion     (Blank rows = not tested)  TODAY'S TREATMENT:                                                                                                                              DATE:                                      EXERCISE LOG               06-18-22  Exercise Repetitions and Resistance Comments  Nustep L4 x16 min   LAQ 3#  BLE x30 reps   HS curl Green   theraband x20 reps   Clam Green theraband x30 reps   Hip flexion  Standing 3x10 reps   Heel/toe raise Standing 3 x10 reps   Rocker Board X 3 mins   One step holds X 2 with each foot forward    Blank cell = exercise not performed today   PATIENT EDUCATION:  Education details: POC, prognosis, strengthening, and goals for therapy  Person educated: Patient Education method: Explanation Education comprehension: verbalized understanding  HOME EXERCISE PROGRAM:  ASSESSMENT:  CLINICAL IMPRESSION: Patient arrived today doing fairly well with less pain in LT knee and LT hip. She was able to progress with reps and sets for current exs, as well as performing new activities. No increase in pain end of session.  OBJECTIVE IMPAIRMENTS: Abnormal gait, decreased activity tolerance, decreased balance, decreased mobility, difficulty walking, decreased strength, postural dysfunction, and pain.   ACTIVITY LIMITATIONS: carrying, lifting, standing, squatting, stairs, transfers, and locomotion level  PARTICIPATION LIMITATIONS: meal prep, cleaning, shopping, and community activity  PERSONAL FACTORS: Time since onset of injury/illness/exacerbation and 3+ comorbidities: HTN, CHF, CAD, DM, history of breast cancer, OA, anxiety, depression, lymphedema, and history of bilateral TKA's   are also affecting patient's functional outcome.   REHAB POTENTIAL: Good  CLINICAL DECISION MAKING: Evolving/moderate complexity  EVALUATION COMPLEXITY: Moderate  GOALS: Goals reviewed with patient? Yes  SHORT  TERM GOALS: Target date: 07/02/22 Patient will be independent with her initial HEP. Baseline: Goal status: INITIAL  2.  Patient will improve her tandem stance to at least 15 seconds bilaterally for improved lower extremity stability. Baseline:  Goal status: INITIAL  3.  Patient will be able to navigate at least 4 steps with a reciprocal pattern for improved household mobility. Baseline:  Goal status:  INITIAL  LONG TERM GOALS: Target date: 07/23/22  Patient will be independent with her advanced HEP. Baseline:  Goal status: INITIAL  2.  Patient will be able to walk for at least 15 minutes without being limited by fatigue. Baseline: Can only walk about 5  minutes currently. Goal status: INITIAL  3.  Patient will be able to navigate at least 8 steps with a reciprocal pattern for improved household mobility. Baseline: be able to navigate at least 8 step; currently using a step to pattern Goal status: INITIAL  4.  Patient will improve her 5 times sit to stand time to 12 seconds or less for improved lower extremity power. Baseline:  Goal status: INITIAL  PLAN:  PT FREQUENCY: 1-2x/week  PT DURATION: 6 weeks  PLANNED INTERVENTIONS: Therapeutic exercises, Therapeutic activity, Neuromuscular re-education, Balance training, Gait training, Patient/Family education, Self Care, Stair training, and Re-evaluation  PLAN FOR NEXT SESSION: NuStep, lower extremity strengthening, and balance interventions   Marin Wisner,CHRIS, PTA 06/18/2022, 10:36 AM

## 2022-06-19 NOTE — Progress Notes (Signed)
HPI female never smoker followed for OSA, lung restriction, dyspnea on exertion, asthmatic bronchitis, complicated by chronic sinusitis, anxiety and depression, paroxysmal atrial tachycardia, CAD, breast cancer left, morbid obesity Unattended Home Sleep Test 11/05/13-AHI 68.1/hour, desaturation to 62%, body weight 320 pounds Echocardiogram 12/04/16- Nl EF Office Spirometry 02/06/17-moderate restriction of exhaled volume. FVC 2.02/61%, FEV1 1.75/69%, ratio 0.86, FEF 25-75% 2.45/113% PFT- 03/12/17-moderate obstructive airways disease with significant response to bronchodilator, normal diffusion. FVC 2.25/69%, FEV1 1.88/76%, ratio 0.84, TLC 80%, DLCO 86% --------------------------------------------------------------------------------------------.  05/10/21- 71 year old female never smoker followed for OSA, lung restriction, dyspnea on exertion, asthmatic bronchitis, complicated by chronic sinusitis, anxiety and depression, paroxysmal atrial tachycardia, CAD, breast cancer left, morbid obesity, Goiter, CSF leak treated at [facility] with ENT f/u at [facility],  CPAP auto 5-15/Adapt -Albuterol hfa, Breo 100, singulair Download- 100%, AHI 2.2/ hr Body weight today- 322 lbs Covid vax-5 Moderna Flu vax- had ACT score 16 -----Patient did PFT today, states that her breathing is good some days and sometimes its not. Varies with weather. No wheeze or cough and no acute event. Not routinely feeling any need for inhalers.  Has avoided COVID infection. PFT 05/10/21- Mild Restriction  06/21/22- 71 year old female never smoker followed for OSA, lung restriction, dyspnea on exertion, asthmatic bronchitis, complicated by chronic sinusitis, anxiety and depression, paroxysmal atrial tachycardia, CAD, breast cancer left, morbid obesity, Goiter, CSF leak treated at [facility] with ENT f/u at [facility],  CPAP auto 5-15/Adapt -Albuterol hfa, Breo 100, singulair Download compliance- 47%, AHI/ 2.4/ hr Body weight today- 278  lbs Covid vax-5 Moderna Flu vax- today senior Had surgery for recurrent CSF leak and then hosp for unexplained GIB in [place]. Was off CPAP.Marland Kitchen Since she restarted CPAP machine has been making loud rubbing noise. She asks about updating sleep study to reassess needs, but admits she has benefited from CPAP. Estimates 40 lb loss.  . ROS-see HPI   + = Positive Constitutional:    +weight loss, night sweats, fevers, chills, fatigue, lassitude. HEENT:   + headaches, difficulty swallowing, tooth/dental problems, sore throat,       sneezing, itching, ear ache, + nasal congestion, post nasal drip, snoring CV:    chest pain, orthopnea, PND, swelling in lower extremities, anasarca,                                                  dizziness, palpitations Resp:   + shortness of breath with exertion or at rest.                productive cough,   non-productive cough, coughing up of blood.              change in color of mucus.   wheezing.   Skin:    rash or lesions. GI:  No-   heartburn, indigestion, abdominal pain, nausea, vomiting, diarrhea,                 change in bowel habits, loss of appetite GU: dysuria, change in color of urine, no urgency or frequency.   flank pain. MS:   joint pain, stiffness, decreased range of motion, back pain. Neuro-     nothing unusual Psych:  change in mood or affect.  depression or anxiety.   memory loss.  OBJ- Physical Exam General- Alert, Oriented, Affect-appropriate, Distress- none acute, + morbidly  obese Skin- rash-none, lesions- none, excoriation- none Lymphadenopathy- none Head- atraumatic,             Eyes- Gross vision intact, PERRLA, conjunctivae and secretions clear            Ears- Hearing, canals-normal            Nose-  turbinate edema, no-Septal dev, mucus, polyps, erosion, perforation,            Throat- Mallampati III , mucosa clear , drainage- none, tonsils- atrophic,  stridor Neck- flexible , trachea midline , thyroid R mass identified as" goiter"  , carotid no bruit Chest - symmetrical excursion , unlabored           Heart/CV- RRR , no murmur , no gallop  , no rub, nl s1 s2                           - JVD- none , edema- none, stasis changes- none, varices- none           Lung- clear,  wheeze- none, cough- none , dullness-none, rub- none           Chest wall-  Abd-  Br/ Gen/ Rectal- Not done, not indicated Extrem- cyanosis- none, clubbing, none, atrophy- none, strength- nl Neuro- grossly intact to observation

## 2022-06-20 ENCOUNTER — Encounter: Payer: Self-pay | Admitting: Internal Medicine

## 2022-06-20 ENCOUNTER — Ambulatory Visit: Payer: Medicare Other | Admitting: Internal Medicine

## 2022-06-20 VITALS — BP 132/92 | HR 78 | Ht 66.0 in | Wt 278.6 lb

## 2022-06-20 DIAGNOSIS — E1165 Type 2 diabetes mellitus with hyperglycemia: Secondary | ICD-10-CM

## 2022-06-20 DIAGNOSIS — E890 Postprocedural hypothyroidism: Secondary | ICD-10-CM | POA: Diagnosis not present

## 2022-06-20 DIAGNOSIS — E1159 Type 2 diabetes mellitus with other circulatory complications: Secondary | ICD-10-CM

## 2022-06-20 DIAGNOSIS — Z85850 Personal history of malignant neoplasm of thyroid: Secondary | ICD-10-CM

## 2022-06-20 LAB — POCT GLYCOSYLATED HEMOGLOBIN (HGB A1C): Hemoglobin A1C: 5.8 % — AB (ref 4.0–5.6)

## 2022-06-20 NOTE — Progress Notes (Signed)
[name], female   DOB: 25-Sep-1950, 71 y.o.   MRN: [id number]  HPI: [name] is a 71 y.o.-year-old female, initially referred by her PCP, Dr. Charlett Blake, returning for follow-up for DM2, dx in 2019 after her breast cancer treatment, insulin-dependent, uncontrolled, with complications (CAD, dCHF), h/o papillary thyroid cancer and postsurgical hypothyroidism.  Last visit 4 months ago.  Interim history: She  continues to have leg swelling. Before our last visit, she started a plant-based diet and she lost 22 pounds in 2 months. She relaxed her diet a little >> gained a little weight back, but overall lost 10 more lbs since last OV. Since last OV, she had CSF repair sx. She had a GIB while visiting her daughter in [place]. The source of bleeding was not found. 1 mo ago, she got a blood transfusion here in [place]. She is going to PT.  DM2:  Reviewed HbA1c: Lab Results  Component Value Date   HGBA1C 6.3 (A) 02/14/2022   HGBA1C 9.1 (A) 11/05/2021   HGBA1C 8.6 (H) 07/16/2021   HGBA1C 8.7 (H) 03/20/2021   HGBA1C 9.7 (H) 12/06/2020   HGBA1C 8.3 (H) 09/05/2020   HGBA1C 9.5 (H) 06/02/2020   HGBA1C 7.0 (H) 09/06/2019   HGBA1C 6.3 (H) 07/02/2018   HGBA1C 9.5 (H) 03/26/2018   At last visit she was on: - Metformin 1000 mg 2x a day, with meals >> 2000 mg at night >> 1000 mg 2x a day - Glimepiride 1 mg with breakfast >> before breakfast - Ozempic 0.25 >> 0.5 >> 1 mg weekly - Tresiba U200 14 >> 16 units daily - started 10/2021 She was previously on [facility]em[facility]  He was tolerating it well but she had to stop it when she stopped going to the weight management clinic during the coronavirus pandemic.  We changed to: - Metformin ER 2000 mg at dinnertime - Ozempic 1 mg weekly in a.m. (was off most of the time since last OV - with her med issues) - PAP - Tresiba U200 12 units daily  Pt checks her sugars 1-2x a day: - am: 187-233 >> 171-235,  251 >> 110-117 >> 113-131, 160 - 2h after b'fast: n/c >> 130-148, 196 - before lunch: n/c  >> 151, 164 - 2h after lunch: n/c>> 120-255 >> 120-180 >> 70s >> 141-169 - before dinner: n 125-166 >> 134-164 >> 100-120 >> 77 - 2h after dinner: n/c >> 171 >> 125-225 >> n/c >> 163 - bedtime: n/c >> 164-185 >> 129, 214 >> n/c >> 109-187 - nighttime: n/c Lowest sugar was 120 >> 70 >> 77; + hypoglycemia awareness in the 70s. Highest sugar was 278 >> 251 >> 130 >> 217  Glucometer: Accu-Chek  Pt's meals are: - Breakfast: eggs, sometimes sandwich with sliced [place], yoghurt - Lunch: sandwich or taco with [place] - Dinner: green bean + onion soup sauce + potato + grilled chicken - Snacks: yasso bars, cucumber Diet green tea/soda.  - no CKD, last BUN/creatinine:  Lab Results  Component Value Date   BUN 11 04/29/2022   BUN 14 04/24/2022   CREATININE 0.54 04/29/2022   CREATININE 0.65 04/24/2022  She is is on losartan 100 mg daily.  -+ HL; last set of lipids: Lab Results  Component Value Date   CHOL 120 01/15/2022   HDL 31 (L) 01/15/2022   LDLCALC 52 01/15/2022   LDLDIRECT 56.0 07/16/2021   TRIG 227 (H) 01/15/2022   CHOLHDL 3.9  01/15/2022  She is on Crestor 40 mg daily, Zetia 10 mg daily, Tricor 145 mg daily. She is not taking Vascepa.  - last eye exam was in 10/2021. No DR. she has a history of cataracts.  - no numbness and tingling in her feet.  Last foot exam 02/14/2022.  Pt has FH of DM in [place].  She also has a history of postsurgical hypothyroidism:  She is on levothyroxine 250 mcg daily: - in am - fasting - at least 30-60 min from b'fast - no calcium - no iron - no multivitamins - + PPIs (Nexium) now >4h after Levothyroxine - not on Biotin  Reviewed previous TFTs: Lab Results  Component Value Date   TSH 1.96 02/14/2022   TSH 5.18 08/31/2021   TSH 8.97 (H) 07/16/2021   TSH 7.93 (H) 06/22/2021   TSH 21.15 (H) 05/07/2021   TSH 21.89 (H) 03/20/2021   TSH 29.57 (H)  12/06/2020   TSH 12.31 (A) 08/04/2020   TSH 1.84 09/06/2019   TSH 2.130 03/26/2018   TSH 1.87 12/22/2017   TSH 0.81 09/22/2017   TSH 1.31 05/22/2017   TSH 1.84 02/17/2017   TSH 0.95 05/02/2016   TSH 1.22 10/16/2015   TSH 0.89 04/17/2015   TSH 1.20 10/13/2014   TSH 1.34 07/11/2014   TSH 0.557 12/31/2013   Lab Results  Component Value Date   FREET4 1.39 02/14/2022   FREET4 1.33 03/26/2018   No FH of thyroid cancer. No h/o radiation tx to head or neck. No herbal supplements. No Biotin use. No recent steroids use.   She also has a history of a goiter >> s/p thyroidectomy 06/2020 >> papillary thyroid cancer  I reviewed the report of the neck CT (06/22/2020): Thyroid: Markedly diffusely enlarged and heterogeneous with multiple poorly defined nodules. This extends into the superior aspect of the superior mediastinum. At the thoracic inlet, this is causing moderate narrowing of the trachea with a transverse diameter of 9 mm. This measured 6 mm on the chest CTA.  A thyroid U/S (04/29/2016) showed no individual nodules: Parenchymal Echotexture: Markedly heterogenous Estimated total number of nodules >/= 1 cm: 0 Isthmus: 1.3 cm; Heterogeneous parenchyma, enlarged. Right lobe: 11.1 cm x 5.2 cm x 4.6 cm; Heterogeneous lobulated parenchyma with no focal nodule. Left lobe: 9.0 cm x 3.6 cm x 6.0 cm; Heterogeneous lobulated parenchyma with no focal nodule.   IMPRESSION: Enlarged multilobulated thyroid compatible with multinodular goiter and medical thyroid disease. No focal nodule identified.  Total thyroidectomy (07/24/2020): A: Substernal thyroid, total substernal thyroidectomy - Multinodular goiter, clinically substernal. - Incidental microscopic papillary thyroid carcinoma, 1 mm in greatest dimension, margins uninvolved. - See synoptic template below. - One lymph node from soft tissue adjacent to thyroid gland with no tumor seen (0/1).   B: Thyroid, pyramidal lobe tissue,  excision - Benign thyroid tissue with adenomatous nodule, no tumor seen.   She also has a history of HTN, GERD, osteoarthritis, anxiety/depression, vitamin D deficiency, breast cancer, urinary incontinence, frequency, urgency, transaminitis, OSA -on CPAP.   No personal h/o pancreatitis or FH of MTC.  ROS: + See HPI, also: ENT: + hypoacusis  Past Medical History:  Diagnosis Date   Anemia 04/05/2012   Anxiety    Anxiety state 09/11/2007   Qualifier: Diagnosis of  By: Sherren Mocha RN, Dorian Pod     Asthma    Atrial tachycardia, paroxysmal    Back pain 10/02/2014   Breast cancer ([place]) 02/17/2017   CAD (coronary artery disease), native coronary artery  remote Cath with nonobstructive ASCAD with 20% mid LAD, 20% prox left circ and 20% ostial RCA   Chicken pox as a child   Chronic diastolic CHF (congestive heart failure), NYHA class 1 (HCC)    Complication of anesthesia    pt states feels different in her body after anesthesia when waking up and also experiences a smell of burnt plastic for approx a wk    Constipation    Depression    Diabetes ([place])    Dilated aortic root ([place])    62m by echo 08/2020   Dysuria 02/17/2017   Elevated LFTs 04/05/2012   GERD (gastroesophageal reflux disease)    Goiter    Heart murmur    hx of one at birth    History of kidney stones    History of radiation therapy 10/31/16-12/17/16   left breast 45 Gy in 25 fractions, left breast boost 16 Gy in 8 fractions   Hx of colonic polyps    Hyperlipidemia    Hypertension    Insomnia 10/08/2016   Joint pain    Malignant neoplasm of upper-outer quadrant of left female breast ([facility] 05/14/2016   not with patient   Measles as a child   Mumps as a child   OA (osteoarthritis) of knee 04/05/2012   Obesity 07/11/2014   PONV (postoperative nausea and vomiting)    Preventative health care 09/05/2013   PVC's (premature ventricular contractions) 05/02/2016   Shortness of breath dyspnea    walking distances / climbing stairs    Sleep apnea 04/05/2012   Tinea corporis 02/23/2013   Type 2 diabetes mellitus with hyperglycemia ([facility] 12/31/2013   Vasomotor rhinitis 04/05/2012   Ventral hernia    Wheezing    Past Surgical History:  Procedure Laterality Date   BREAST LUMPECTOMY Left 2017   BREAST LUMPECTOMY WITH RADIOACTIVE SEED AND SENTINEL LYMPH NODE BIOPSY Left 06/21/2016   Procedure: LEFT BREAST LUMPECTOMY WITH RADIOACTIVE SEED AND SENTINEL LYMPH NODE BIOPSY, INJECT BLUE DYE LEFT BREAST;  Surgeon: HFanny Skates MD;  [facility]  Service: General;  Laterality: Left;   CARDIAC CATHETERIZATION     normal coroary arteries per patient   CARDIOVASCULAR STRESS TEST     10/12/2013   CESAREAN SECTION     X 3   CHOLECYSTECTOMY     COLONOSCOPY WITH PROPOFOL N/A 03/28/2015   Procedure: COLONOSCOPY WITH PROPOFOL;  Surgeon: JJuanita Craver MD;  [facility];  Service: Endoscopy;  Laterality: N/A;   HERNIA REPAIR  02/08/2011   ventral hernia   JOINT REPLACEMENT     bilateral   KNEE ARTHROSCOPY  05/2010   bilateral   LEFT AND RIGHT HEART CATHETERIZATION WITH CORONARY ANGIOGRAM N/A 11/08/2013   Procedure: LEFT AND RIGHT HEART CATHETERIZATION WITH CORONARY ANGIOGRAM;  Surgeon: CBurnell Blanks MD;  [facility];  Service: Cardiovascular;  Laterality: N/A;   MENISCUS REPAIR  2009   MOUTH SURGERY     teeth implants   PORT-A-CATH REMOVAL N/A 01/24/2017   Procedure: REMOVAL PORT-A-CATH;  Surgeon: IFanny Skates MD;  [place]  Service: General;  Laterality: N/A;   PORTACATH PLACEMENT Right 07/16/2016   Procedure: INSERTION PORT-A-CATH RIGHT INTERNAL JUGULAR WITH ULTRASOUND;  Surgeon: HFanny Skates MD;  [facility]  Service: General;  Laterality: Right;   REPAIR DURAL / CSF LEAK  2021   [facility]  [facility]   TOTAL KNEE ARTHROPLASTY Right 05/10/2014  Procedure: RIGHT TOTAL KNEE ARTHROPLASTY;  Surgeon: Mauri Pole,  MD;  [facility];  Service: Orthopedics;  Laterality: Right;   TOTAL THYROIDECTOMY Bilateral 2022   TUBAL LIGATION     WISDOM TOOTH EXTRACTION  2000   Social History   Socioeconomic History   Marital status: Divorced    Spouse name: Not on file   Number of children: 3   Years of education: Not on file   Highest education level: Not on file  Occupational History   Occupation: retired - [employer]  Tobacco Use   Smoking status: Never   Smokeless tobacco: Never  Vaping Use   Vaping Use: Never used  Substance and Sexual Activity   Alcohol use: Not Currently    Comment: rarely   Drug use: No   Sexual activity: Never  Other Topics Concern   Not on file  Social History Narrative   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)   Social Determinants of [place] Strain: [place]  (08/13/2021)   Overall Financial Resource Strain (CARDIA)    Difficulty of Paying Living Expenses: Not hard at all  Food Insecurity: No [place] (02/26/2022)   Hunger Vital Sign    Worried About Running Out of Food in the Last Year: Never true    [place] in the Last Year: Never true  Transportation Needs: No Transportation Needs (04/24/2022)   PRAPARE - Hydrologist (Medical): No    Lack of Transportation (Non-Medical): No  Physical Activity: Insufficiently Active (09/21/2021)   Exercise Vital Sign    Days of Exercise per Week: 4 days    Minutes of Exercise per Session: 10 min  Stress: No Stress Concern Present (08/13/2021)   [place]    Feeling of Stress : Not at all  Social Connections: Moderately Isolated (08/13/2021)   Social Connection and Isolation Panel [NHANES]    Frequency of Communication with Friends and Family: More than three times a week    Frequency of Social Gatherings with Friends and Family: More than three times a week    Attends Religious  Services: More than 4 times per year    Active Member of [employer] or Organizations: No    Attends Archivist Meetings: Never    Marital Status: Divorced  Human resources officer Violence: Not At Risk (08/13/2021)   Humiliation, Afraid, Rape, and Kick questionnaire    Fear of Current or Ex-Partner: No    Emotionally Abused: No    Physically Abused: No    Sexually Abused: No   Current Outpatient Medications on File Prior to Visit  Medication Sig Dispense Refill   acetaminophen (TYLENOL) 500 MG tablet Take 1,000 mg by mouth 2 (two) times daily as needed for moderate pain or headache.     albuterol (VENTOLIN HFA) 108 (90 Base) MCG/ACT inhaler Inhale 2 puffs every 4-6 hours as needed - rescue. 18 g 12   anastrozole (ARIMIDEX) 1 MG tablet Take 1 tablet (1 mg total) by mouth daily. 90 tablet 3   aspirin EC 81 MG tablet Take 81 mg by mouth daily. Swallow whole.     blood glucose meter kit and supplies Dispense based on patient and insurance preference. Use up to four times daily as directed. (FOR ICD-10 E10.9, E11.9). 1 each 0   budesonide-formoterol (SYMBICORT) 160-4.5 MCG/ACT inhaler Inhale 2 puffs into the lungs 2 (two) times  daily.     busPIRone (BUSPAR) 7.5 MG tablet Take 1 tablet (7.5 mg total) by mouth 3 (three) times daily. As needed for anxiety 90 tablet 5   carvedilol (COREG) 25 MG tablet Take 1 tablet (25 mg total) by mouth 2 (two) times daily with a meal. 180 tablet 1   diltiazem (CARDIZEM CD) 120 MG 24 hr capsule TAKE ONE CAPSULE BY MOUTH DAILY 90 capsule 1   Docusate Calcium (STOOL SOFTENER PO) Take by mouth daily as needed.     escitalopram (LEXAPRO) 20 MG tablet TAKE ONE TABLET BY MOUTH EVERY MORNING AND ONE-HALF TABLET EVERY EVENING 135 tablet 1   esomeprazole (NEXIUM) 40 MG capsule Take 1 capsule (40 mg total) by mouth daily. 90 capsule 3   ezetimibe (ZETIA) 10 MG tablet Take 1 tablet (10 mg total) by mouth daily. 90 tablet 3   fenofibrate (TRICOR) 145 MG tablet TAKE ONE TABLET  ONCE DAILY 90 tablet 1   Ferrous Fumarate-Folic Acid (HEMOCYTE-F) 324-1 MG TABS 1 tablet po daily 30 tablet 3   fluticasone (FLONASE) 50 MCG/ACT nasal spray Place 2 sprays into both nostrils daily. 16 g 6   furosemide (LASIX) 20 MG tablet TAKE ONE TABLET ONCE DAILY 90 tablet 0   glucose blood (ACCU-CHEK AVIVA) test strip Twice daily (please use brand that patient insurance covers) 100 each 5   Hypromellose (ARTIFICIAL TEARS OP) Place 1 drop into both eyes daily as needed (dry eyes).      insulin degludec (TRESIBA FLEXTOUCH) 200 UNIT/ML FlexTouch Pen Inject 12-20 Units into the skin daily. (Patient taking differently: Inject 12 Units into the skin daily.) 9 mL 3   Insulin Pen Needle 32G X 4 MM MISC Use 1x a day 100 each 3   levothyroxine (SYNTHROID) 200 MCG tablet Take 1 tablet (200 mcg total) by mouth daily. 90 tablet 1   levothyroxine (SYNTHROID) 50 MCG tablet TAKE ONE TABLET ONCE DAILY 90 tablet 0   loratadine (CLARITIN) 10 MG tablet Take 1 tablet (10 mg total) by mouth daily. 30 tablet 11   losartan (COZAAR) 100 MG tablet TAKE ONE TABLET DAILY 90 tablet 0   metFORMIN (GLUCOPHAGE-XR) 500 MG 24 hr tablet Take 4 tablets (2,000 mg total) by mouth daily with supper. 360 tablet 3   montelukast (SINGULAIR) 10 MG tablet TAKE ONE TABLET AT BEDTIME 90 tablet 1   rosuvastatin (CRESTOR) 40 MG tablet TAKE ONE TABLET DAILY 90 tablet 1   Semaglutide, 1 MG/DOSE, (OZEMPIC, 1 MG/DOSE,) 4 MG/3ML SOPN Inject 1 mg into the skin once a week. 9 mL 3   Vitamin D, Ergocalciferol, (DRISDOL) 1.25 MG (50000 UNIT) CAPS capsule Take 1 capsule (50,000 Units total) by mouth every 7 (seven) days. 4 capsule 4   No current facility-administered medications on file prior to visit.   Allergies  Allergen Reactions   Oxycodone Rash    Does not feel good. Makes her sleepy.    Family History  Problem Relation Age of Onset   Thyroid disease Mother        hypothyroid   Hyperlipidemia Mother    Depression Mother    Anxiety  disorder Mother    Heart attack Father 5   Hypertension Father    Arthritis Father        RA   Coronary artery disease Father    High Cholesterol Father    Coronary artery disease Brother    Heart disease Brother    Cancer Maternal Grandmother  colon   Heart attack Maternal Grandfather    Alcohol abuse Paternal Grandfather    Cancer Maternal Aunt        colon   PE: BP (!) 132/92 (BP Location: Right Arm, Patient Position: Sitting, Cuff Size: Normal)   Pulse 78   Ht _0  (1.676 m)   Wt 278 lb 9.6 oz (126.4 kg)   SpO2 97%   BMI 44.97 kg/m  Wt Readings from Last 3 Encounters:  06/20/22 278 lb 9.6 oz (126.4 kg)  06/11/22 277 lb (125.6 kg)  05/21/22 279 lb (126.6 kg)   Constitutional: overweight, in NAD Eyes: no exophthalmos ENT: no masses palpated in neck, no cervical lymphadenopathy Cardiovascular: RRR, No MRG, + B periankle edema Respiratory: CTA B Musculoskeletal: no deformities Skin: no rashes Neurological: no tremor with outstretched hands  ASSESSMENT: 1. DM2, insulin-dependent, uncontrolled, with complications - CAD - dCHF  2.  Postsurgical hypothyroidism  3.  Microscopic papillary thyroid cancer  PLAN:  1. Patient with longstanding, uncontrolled, type 2 diabetes, on oral antidiabetic regimen with metformin and also weekly GLP-1 receptor agonist and long-acting insulin, with improved control at last visit.  At that time, HbA1c was 6.3%, much improved, from 9.1%.  2 months prior to our last visit, she started a plant-based diet.  I advised her to reduce the dose of Tresiba, stop the sulfonylurea, move the entire metformin at dinnertime and continue the Ozempic. -At today's visit, sugars appear to be mostly at goal, but she has more slight hyperglycemic exceptions.  They appear to have been improved in the last 2 weeks.  We discussed that the high blood sugars are likely due to the fact that she was off Ozempic for the majority of the time since last visit due  to her medical problems.  She is now back on it.  She tolerates it well.  For now, I did not suggest a change in regimen. -I advised her to: Patient Instructions  Please continue: - Metformin ER 2000 mg at dinnertime - Ozempic 1 mg weekly in a.m.  - [name] U200 12 units daily  Continue Levothyroxine 250 mcg daily.  Take the thyroid hormone every day, with water, at least 30 minutes before breakfast, separated by at least 4 hours from: - acid reflux medications - calcium - iron - multivitamins  Please return in 4 months with your sugar log.  - we checked her HbA1c: 5.8% (lower than expected from her log, possibly due to history of transfusion) - advised to check sugars at different times of the day - 1x a day, rotating check times - advised for yearly eye exams >> she is UTD - return to clinic in 4 months  2.  Postsurgical hypothyroidism - latest thyroid labs reviewed with pt. >> normal: Lab Results  Component Value Date   TSH 1.96 02/14/2022  - she continues on LT4 250 mcg daily -I did recommend to chew the tablet for better absorption - pt feels good on this dose. - we discussed about taking the thyroid hormone every day, with water, >30 minutes before breakfast, separated by >4 hours from acid reflux medications, calcium, iron, multivitamins. Pt. is taking it correctly.  3.  Microscopic papillary thyroid cancer -She had 1 focus of 1 mm papillary thyroid cancer per review [facility] -This was incidentally discovered on pathology after removal of compressive goiter -I discussed with patient and her daughter in the past that she was most likely cured and no intervention is  needed -Will continue to monitor her clinically  Philemon Kingdom, MD PhD [facility] Endocrinology

## 2022-06-20 NOTE — Patient Instructions (Signed)
Please continue: - Metformin ER 2000 mg at dinnertime - Ozempic 1 mg weekly in a.m.  - [name] U200 12 units daily  Continue Levothyroxine 250 mcg daily.  Take the thyroid hormone every day, with water, at least 30 minutes before breakfast, separated by at least 4 hours from: - acid reflux medications - calcium - iron - multivitamins  Please return in 4 months with your sugar log.

## 2022-06-21 ENCOUNTER — Ambulatory Visit: Payer: Medicare Other | Admitting: Internal Medicine

## 2022-06-21 ENCOUNTER — Encounter: Payer: Self-pay | Admitting: Internal Medicine

## 2022-06-21 VITALS — BP 126/86 | HR 74 | Ht 66.0 in | Wt 278.0 lb

## 2022-06-21 DIAGNOSIS — G4733 Obstructive sleep apnea (adult) (pediatric): Secondary | ICD-10-CM | POA: Diagnosis not present

## 2022-06-21 DIAGNOSIS — Z23 Encounter for immunization: Secondary | ICD-10-CM

## 2022-06-21 NOTE — Assessment & Plan Note (Signed)
Still significantly overweight.  Might benefit from [facility]- discuss with PCP

## 2022-06-21 NOTE — Assessment & Plan Note (Signed)
Recognize benefit from CPAP but asks status update after weight loss. Machine making abnormal noise Plan- Repair or replace old, malfunctioning CPAP. Update home sleep test

## 2022-06-21 NOTE — Patient Instructions (Addendum)
Order- DME [employer]   Please service or replace old CPAP machine making loud noise Auto 5-15, mask of choice, humidifier, supplies, AirView/ card   Order- schedule home sleep test  (off CPAP)   dx OSA  Order- Flu vax senior  You can call us for results of sleep test about 2 weeks after it is done.

## 2022-06-25 ENCOUNTER — Ambulatory Visit: Payer: Medicare Other | Attending: Family Medicine | Admitting: *Deleted

## 2022-06-25 ENCOUNTER — Encounter: Payer: Self-pay | Admitting: *Deleted

## 2022-06-25 DIAGNOSIS — M6281 Muscle weakness (generalized): Secondary | ICD-10-CM | POA: Insufficient documentation

## 2022-06-25 DIAGNOSIS — R2681 Unsteadiness on feet: Secondary | ICD-10-CM | POA: Insufficient documentation

## 2022-06-25 NOTE — Therapy (Signed)
OUTPATIENT PHYSICAL THERAPY LOWER EXTREMITY EVALUATION   Patient Name: [name] MRN: [id number] DOB:04-17-1951, 72 y.o., female Today's Date: 06/25/2022  END OF SESSION:  PT End of Session - 06/25/22 0951     Visit Number 4    Number of Visits 12    Date for PT Re-Evaluation 08/16/22    PT Start Time 0945    PT Stop Time 1033    PT Time Calculation (min) 48 min            Past Medical History:  Diagnosis Date   Anemia 04/05/2012   Anxiety    Anxiety state 09/11/2007   Qualifier: Diagnosis of  By: Sherren Mocha RN, Dorian Pod     Asthma    Atrial tachycardia, paroxysmal    Back pain 10/02/2014   Breast cancer ([place]) 02/17/2017   CAD (coronary artery disease), native coronary artery    remote Cath with nonobstructive ASCAD with 20% mid LAD, 20% prox left circ and 20% ostial RCA   Chicken pox as a child   Chronic diastolic CHF (congestive heart failure), NYHA class 1 ([place])    Complication of anesthesia    pt states feels different in her body after anesthesia when waking up and also experiences a smell of burnt plastic for approx a wk    Constipation    Depression    Diabetes ([place])    Dilated aortic root ([place])    70m by echo 08/2020   Dysuria 02/17/2017   Elevated LFTs 04/05/2012   GERD (gastroesophageal reflux disease)    Goiter    Heart murmur    hx of one at birth    History of kidney stones    History of radiation therapy 10/31/16-12/17/16   left breast 45 Gy in 25 fractions, left breast boost 16 Gy in 8 fractions   Hx of colonic polyps    Hyperlipidemia    Hypertension    Insomnia 10/08/2016   Joint pain    [place]align[place]outer [place] 05/14/[place]   not with patient   Measles as a child   Mumps as a child   OA (osteoarthritis) of knee 04/05/2012   Obesity 07/11/2014   PONV (postoperative nausea and vomiting)    Preventative health care 09/05/2013   PVC's (premature ventricular contractions) 05/02/2016   Shortness of breath  dyspnea    walking distances / climbing stairs   Sleep apnea 04/05/2012   Tinea corporis 02/23/2013   Type 2 diabetes mellitus with hyperglycemia ([place] 12/31/2013   Vasomotor rhinitis 04/05/2012   Ventral hernia    Wheezing    Past Surgical History:  Procedure Laterality Date   BREAST LUMPECTOMY Left 2017   BREAST LUMPECTOMY WITH RADIOACTIVE SEED AND SENTINEL LYMPH NODE BIOPSY Left 06/21/2016   Procedure: LEFT BREAST LUMPECTOMY WITH RADIOACTIVE SEED AND SENTINEL LYMPH NODE BIOPSY, INJECT BLUE DYE LEFT BREAST;  Surgeon: HFanny Skates MD;  [facility];  Service: General;  Laterality: Left;   CARDIAC CATHETERIZATION     normal coroary arteries per patient   CARDIOVASCULAR STRESS TEST     10/12/2013   CESAREAN SECTION     X 3   CHOLECYSTECTOMY     COLONOSCOPY WITH PROPOFOL N/A 03/28/2015   Procedure: COLONOSCOPY WITH PROPOFOL;  Surgeon: JJuanita Craver MD;  [facility];  Service: Endoscopy;  Laterality: N/A;   HERNIA REPAIR  02/08/2011   ventral hernia   JOINT REPLACEMENT     bilateral   KNEE  ARTHROSCOPY  05/2010   bilateral   LEFT AND RIGHT HEART CATHETERIZATION WITH CORONARY ANGIOGRAM N/A 11/08/2013   Procedure: LEFT AND RIGHT HEART CATHETERIZATION WITH CORONARY ANGIOGRAM;  Surgeon: Christopher D McAlhany, MD;  [facility]: [facility] C[facility];  Service: Cardiovascular;  Laterality: N/A;   MENISCUS REPAIR  2009   MOUTH SURGERY     teeth implants   PORT-A-CATH REMOVAL N/A 01/24/2017   Procedure: REMOVAL PORT-A-CATH;  Surgeon: Ingram, Haywood, MD;  [facility];  Service: General;  Laterality: N/A;   PORTACATH PLACEMENT Right 07/16/2016   Procedure: INSERTION PORT-A-CATH RIGHT INTERNAL JUGULAR WITH ULTRASOUND;  Surgeon: Haywood Ingram, MD;  [facility];  Service: General;  Laterality: Right;   REPAIR DURAL / CSF LEAK  2021   performed at [facility] in [place]   TOTAL KNEE ARTHROPLASTY  2011   left   TOTAL KNEE ARTHROPLASTY Right 05/10/2014   Procedure: RIGHT  TOTAL KNEE ARTHROPLASTY;  Surgeon: Matthew D Olin, MD;  [facility];  Service: Orthopedics;  Laterality: Right;   TOTAL THYROIDECTOMY Bilateral 2022   TUBAL LIGATION     WISDOM TOOTH EXTRACTION  2000   Patient Active Problem List   Diagnosis Date Noted   Asthma 06/11/2022   IDA (iron deficiency anemia) 04/30/2022   Lower GI bleed 04/29/2022   Allergic rhinitis 11/09/2021   Hx of papillary thyroid carcinoma 07/19/2021   Poorly controlled type 2 diabetes mellitus with circulatory disorder (HCC) 07/19/2021   Atypical chest pain 07/16/2021   Urinary incontinence 07/16/2021   Hypothyroidism 03/20/2021   Nausea 12/06/2020   Muscle cramps 12/06/2020   H/O thyroidectomy 08/30/2020   CSF leak from nose 01/13/2020   Uncontrolled type 2 diabetes mellitus with hyperglycemia (HCC) 04/14/2018   Vitamin D deficiency 03/30/2018   Dental infection 03/29/2018   Dysphagia 12/28/2017   Cough 12/28/2017   Peripheral neuropathy 09/22/2017   Lipoma 09/22/2017   Lymphedema 05/22/2017   Asthmatic bronchitis, mild intermittent, uncomplicated 04/22/2017   Breast cancer (HCC) 02/17/2017   Dysuria 02/17/2017   Insomnia 10/08/2016   Dilated aortic root (HCC)    Port catheter in place 08/01/2016   Malignant neoplasm of upper-outer quadrant of left female breast (HCC) 05/14/2016   PVC's (premature ventricular contractions) 05/02/2016   Neck mass 04/21/2016   LVH (left ventricular hypertrophy) due to hypertensive disease, with heart failure (HCC) 04/18/2016   Tonsillar hypertrophy 07/07/2015   Back pain 10/02/2014   Obesity 07/11/2014   S/P right TKA 05/10/2014   S/P knee replacement 05/10/2014   Atrial tachycardia, paroxysmal 02/22/2014   Type 2 diabetes mellitus with hyperglycemia (HCC) 12/31/2013   Chronic diastolic CHF (congestive heart failure) (HCC) 11/22/2013   CAD (coronary artery disease), native coronary artery 11/22/2013   Dyspnea on exertion 11/03/2013   Undiagnosed cardiac murmurs  09/05/2013   Preventative health care 09/05/2013   Musculoskeletal pain 11/10/2012   Vasomotor rhinitis 04/05/2012   Obstructive sleep apnea 04/05/2012   Anemia 04/05/2012   Elevated LFTs 04/05/2012   OA (osteoarthritis) of knee 04/05/2012   Hernia 10/13/2010   Hyperlipidemia 09/11/2007   Anxiety and depression 09/11/2007   Essential hypertension 09/11/2007   GERD 09/11/2007   RENAL CALCULUS 09/11/2007   RENAL CYST 09/11/2007   COLONIC POLYPS, HX OF 09/11/2007   REFERRING PROVIDER: Blyth, Stacey A, MD   REFERRING DIAG: Weakness of both lower extremities   THERAPY DIAG:  Muscle weakness (generalized)  Unsteadiness on feet  Rationale for Evaluation and Treatment: Rehabilitation  ONSET DATE: October 2023    SUBJECTIVE:   SUBJECTIVE STATEMENT:   Did okay after last Rx. RT knee was hurting when I came in, but better after I left  PERTINENT HISTORY: HTN, CHF, CAD, DM, history of breast cancer, OA, anxiety, depression, lymphedema, and history of bilateral TKA's   PAIN:  Are you having pain? Yes: NPRS scale: 3/10 Pain location: left posterior hip  Pain description: sharp Aggravating factors: rolling over in bed, transfers Relieving factors: heat  PRECAUTIONS: Fall  NEXT MD VISIT: 06/11/22  OBJECTIVE:   LOWER EXTREMITY MMT:  MMT Right eval Left eval  Hip flexion 3+/5 4-/5  Hip extension    Hip abduction    Hip adduction    Hip internal rotation    Hip external rotation    Knee flexion 4/5 4/5  Knee extension 4-/5 4-/5  Ankle dorsiflexion 4-/5 4-/5  Ankle plantarflexion    Ankle inversion    Ankle eversion     (Blank rows = not tested)  TODAY'S TREATMENT:                                                                                                                              DATE:                                      EXERCISE LOG               06-25-22  Exercise Repetitions and Resistance Comments  Nustep L4 x 20 min   LAQ 3#  BLE x30 reps   HS curl  Green  theraband 2x15 reps Bil.   Clam Green theraband x30 reps   Ball squeeze X 10 hold 10 secs   Hip flexion  Standing 3x10 reps   Heel/toe raise Standing 3 x10 reps   Rocker Board X 3 mins   One step holds balance X 4 with each foot forward hold 30 secs    Blank cell = exercise not performed today   PATIENT EDUCATION:  Education details: POC, prognosis, strengthening, and goals for therapy  Person educated: Patient Education method: Explanation Education comprehension: verbalized understanding  HOME EXERCISE PROGRAM:  ASSESSMENT:  CLINICAL IMPRESSION: Patient arrived today doing fairly well and reports doing well after last Rx. Pt was able to continue with LE strengthening and balance progressions today and did well. Decreased UE assist with balance exercises today.    OBJECTIVE IMPAIRMENTS: Abnormal gait, decreased activity tolerance, decreased balance, decreased mobility, difficulty walking, decreased strength, postural dysfunction, and pain.   ACTIVITY LIMITATIONS: carrying, lifting, standing, squatting, stairs, transfers, and locomotion level  PARTICIPATION LIMITATIONS: meal prep, cleaning, shopping, and community activity  PERSONAL FACTORS: Time since onset of injury/illness/exacerbation and 3+ comorbidities: HTN, CHF, CAD, DM, history of breast cancer, OA, anxiety, depression, lymphedema, and history of bilateral TKA's   are also affecting patient's functional outcome.   REHAB POTENTIAL: Good    CLINICAL DECISION MAKING: Evolving/moderate complexity  EVALUATION COMPLEXITY: Moderate  GOALS: Goals reviewed with patient? Yes  SHORT TERM GOALS: Target date: 07/02/22 Patient will be independent with her initial HEP. Baseline: Goal status: Met  2.  Patient will improve her tandem stance to at least 15 seconds bilaterally for improved lower extremity stability. Baseline:  Goal status: Partially met  3.  Patient will be able to navigate at least 4 steps with a  reciprocal pattern for improved household mobility. Baseline:  Goal status: Partially met  LONG TERM GOALS: Target date: 07/23/22  Patient will be independent with her advanced HEP. Baseline:  Goal status: INITIAL  2.  Patient will be able to walk for at least 15 minutes without being limited by fatigue. Baseline: Can only walk about 5  minutes currently. Goal status: INITIAL  3.  Patient will be able to navigate at least 8 steps with a reciprocal pattern for improved household mobility. Baseline: be able to navigate at least 8 step; currently using a step to pattern Goal status: INITIAL  4.  Patient will improve her 5 times sit to stand time to 12 seconds or less for improved lower extremity power. Baseline:  Goal status: INITIAL  PLAN:  PT FREQUENCY: 1-2x/week  PT DURATION: 6 weeks  PLANNED INTERVENTIONS: Therapeutic exercises, Therapeutic activity, Neuromuscular re-education, Balance training, Gait training, Patient/Family education, Self Care, Stair training, and Re-evaluation  PLAN FOR NEXT SESSION: NuStep, lower extremity strengthening, and balance interventions   RAMSEUR,CHRIS, PTA 06/25/2022, 10:49 AM  

## 2022-06-26 ENCOUNTER — Other Ambulatory Visit: Payer: Self-pay | Admitting: Family Medicine

## 2022-06-26 DIAGNOSIS — Z1231 Encounter for screening mammogram for malignant neoplasm of breast: Secondary | ICD-10-CM

## 2022-06-27 ENCOUNTER — Ambulatory Visit: Payer: Medicare Other

## 2022-06-27 DIAGNOSIS — M6281 Muscle weakness (generalized): Secondary | ICD-10-CM

## 2022-06-27 DIAGNOSIS — R2681 Unsteadiness on feet: Secondary | ICD-10-CM | POA: Diagnosis not present

## 2022-06-27 NOTE — Therapy (Signed)
OUTPATIENT PHYSICAL THERAPY LOWER EXTREMITY EVALUATION   Patient Name: [name] MRN: [id number] DOB:1951-05-05, 72 y.o., female Today's Date: 06/27/2022  END OF SESSION:  PT End of Session - 06/27/22 0947     Visit Number 5    Number of Visits 12    Date for PT Re-Evaluation 08/16/22    PT Start Time 0945    PT Stop Time 1030    PT Time Calculation (min) 45 min    Activity Tolerance Patient tolerated treatment well    Behavior During Therapy [facility] for tasks assessed/performed             Past Medical History:  Diagnosis Date   Anemia 04/05/2012   Anxiety    Anxiety state 09/11/2007   Qualifier: Diagnosis of  By: Sherren Mocha RN, Dorian Pod     Asthma    Atrial tachycardia, paroxysmal    Back pain 10/02/2014   Breast cancer ([place]) 02/17/2017   CAD (coronary artery disease), native coronary artery    remote Cath with nonobstructive ASCAD with 20% mid LAD, 20% prox left circ and 20% ostial RCA   Chicken pox as a child   Chronic diastolic CHF (congestive heart failure), NYHA class 1 ([place])    Complication of anesthesia    pt states feels different in her body after anesthesia when waking up and also experiences a smell of burnt plastic for approx a wk    Constipation    Depression    Diabetes ([place])    Dilated aortic root ([place])    7m by echo 08/2020   Dysuria 02/17/2017   Elevated LFTs 04/05/2012   GERD (gastroesophageal reflux disease)    Goiter    Heart murmur    hx of one at birth    History of kidney stones    History of radiation therapy 10/31/16-12/17/16   left breast 45 Gy in 25 fractions, left breast boost 16 Gy in 8 fractions   Hx of colonic polyps    Hyperlipidemia    Hypertension    Insomnia 10/08/2016   Joint pain    [place] 05/14[place]   not with patient   Measles as a child   Mumps as a child   OA (osteoarthritis) of knee 04/05/2012   Obesity 07/11/2014   PONV (postoperative nausea and  vomiting)    Preventative health care 09/05/2013   PVC's (premature ventricular contractions) 05/02/2016   Shortness of breath dyspnea    walking distances / climbing stairs   Sleep apnea 04/05/2012   Tinea corporis 02/23/2013   Type 2 diabetes mellitus with hyperglycemia ([place] 12/31/2013   Vasomotor rhinitis 04/05/2012   Ventral hernia    Wheezing    Past Surgical History:  Procedure Laterality Date   BREAST LUMPECTOMY Left 2017   BREAST LUMPECTOMY WITH RADIOACTIVE SEED AND SENTINEL LYMPH NODE BIOPSY Left 06/21/2016   Procedure: LEFT BREAST LUMPECTOMY WITH RADIOACTIVE SEED AND SENTINEL LYMPH NODE BIOPSY, INJECT BLUE DYE LEFT BREAST;  Surgeon: HFanny Skates MD;  [facility];  Service: General;  Laterality: Left;   CARDIAC CATHETERIZATION     normal coroary arteries per patient   CARDIOVASCULAR STRESS TEST     10/12/2013   CESAREAN SECTION     X 3   CHOLECYSTECTOMY     COLONOSCOPY WITH PROPOFOL N/A 03/28/2015   Procedure: COLONOSCOPY WITH PROPOFOL;  Surgeon: JJuanita Craver MD;  [facility];  Service: Endoscopy;  Laterality: N/A;  HERNIA REPAIR  02/08/2011   ventral hernia   JOINT REPLACEMENT     bilateral   KNEE ARTHROSCOPY  05/2010   bilateral   LEFT AND RIGHT HEART CATHETERIZATION WITH CORONARY ANGIOGRAM N/A 11/08/2013   Procedure: LEFT AND RIGHT HEART CATHETERIZATION WITH CORONARY ANGIOGRAM;  Surgeon: [name], MD;  [facility] C[facility] LAB;  Service: Cardiovascular;  Laterality: N/A;   MENISCUS REPAIR  2009   MOUTH SURGERY     teeth implants   PORT-A-CATH REMOVAL N/A 01/24/2017   Procedure: REMOVAL PORT-A-CATH;  Surgeon: Fanny Skates, MD;  Location: [facility];  Service: General;  Laterality: N/A;   PORTACATH PLACEMENT Right 07/16/2016   Procedure: INSERTION PORT-A-CATH RIGHT INTERNAL JUGULAR WITH ULTRASOUND;  Surgeon: Fanny Skates, MD;  Location: [facility];  Service: General;  Laterality: Right;   REPAIR DURAL / CSF LEAK  2021   performed at [facility] in [place]  2011   left   TOTAL KNEE ARTHROPLASTY Right 05/10/2014   Procedure: RIGHT TOTAL KNEE ARTHROPLASTY;  Surgeon: Mauri Pole, MD;  [facility];  Service: Orthopedics;  Laterality: Right;   TOTAL THYROIDECTOMY Bilateral 2022   TUBAL LIGATION     WISDOM TOOTH EXTRACTION  2000   Patient Active Problem List   Diagnosis Date Noted   Asthma 06/11/2022   IDA (iron deficiency anemia) 04/30/2022   Lower GI bleed 04/29/2022   Allergic rhinitis 11/09/2021   Hx of papillary thyroid carcinoma 07/19/2021   Poorly controlled type 2 diabetes mellitus with circulatory disorder ([place]) 07/19/2021   Atypical chest pain 07/16/2021   Urinary incontinence 07/16/2021   Hypothyroidism 03/20/2021   Nausea 12/06/2020   Muscle cramps 12/06/2020   H/O thyroidectomy 08/30/2020   CSF leak from nose 01/13/2020   Uncontrolled type 2 diabetes mellitus with hyperglycemia ([place]) 04/14/2018   Vitamin D deficiency 03/30/2018   Dental infection 03/29/2018   Dysphagia 12/28/2017   Cough 12/28/2017   Peripheral neuropathy 09/22/2017   Lipoma 09/22/2017   Lymphedema 05/22/2017   Asthmatic bronchitis, mild intermittent, uncomplicated 83/38/2505   Breast cancer ([place]) 02/17/2017   Dysuria 02/17/2017   Insomnia 10/08/2016   Dilated aortic root ([place])    Port catheter in place 08/01/2016   Malignant neoplasm of upper-outer quadrant of left female breast ([place]) 05/14/2016   PVC's (premature ventricular contractions) 05/02/2016   Neck mass 04/21/2016   LVH (left ventricular hypertrophy) due to hypertensive disease, with heart failure ([place]) 04/18/2016   Tonsillar hypertrophy 07/07/2015   Back pain 10/02/2014   Obesity 07/11/2014   S/P right TKA 05/10/2014   S/P knee replacement 05/10/2014   Atrial tachycardia, paroxysmal 02/22/2014   Type 2 diabetes mellitus with hyperglycemia ([place]) 12/31/2013   Chronic diastolic CHF (congestive heart failure) ([place]) 11/22/2013    CAD (coronary artery disease), native coronary artery 11/22/2013   Dyspnea on exertion 11/03/2013   Undiagnosed cardiac murmurs 09/05/2013   Preventative health care 09/05/2013   Musculoskeletal pain 11/10/2012   Vasomotor rhinitis 04/05/2012   Obstructive sleep apnea 04/05/2012   Anemia 04/05/2012   Elevated LFTs 04/05/2012   OA (osteoarthritis) of knee 04/05/2012   Hernia 10/13/2010   Hyperlipidemia 09/11/2007   Anxiety and depression 09/11/2007   Essential hypertension 09/11/2007   GERD 09/11/2007   RENAL CALCULUS 09/11/2007   RENAL CYST 09/11/2007   COLONIC POLYPS, HX OF 09/11/2007   REFERRING PROVIDER: [name], MD   REFERRING DIAG: Weakness of both lower extremities   THERAPY DIAG:  Muscle weakness (generalized)  Unsteadiness on feet  Rationale for Evaluation and Treatment: Rehabilitation  ONSET DATE: October 2023  SUBJECTIVE:   SUBJECTIVE STATEMENT:  Patient reports that she feels good today.   PERTINENT HISTORY: HTN, CHF, CAD, DM, history of breast cancer, OA, anxiety, depression, lymphedema, and history of bilateral TKA's   PAIN:  Are you having pain? Yes: NPRS scale: 0/10 Pain location: left posterior hip  Pain description: sharp Aggravating factors: rolling over in bed, transfers Relieving factors: heat  PRECAUTIONS: Fall  NEXT MD VISIT: 06/11/22  OBJECTIVE:   LOWER EXTREMITY MMT:  MMT Right eval Left eval  Hip flexion 3+/5 4-/5  Hip extension    Hip abduction    Hip adduction    Hip internal rotation    Hip external rotation    Knee flexion 4/5 4/5  Knee extension 4-/5 4-/5  Ankle dorsiflexion 4-/5 4-/5  Ankle plantarflexion    Ankle inversion    Ankle eversion     (Blank rows = not tested)  TODAY'S TREATMENT:                                                                                                                              DATE:                                     1/4 EXERCISE LOG  Exercise Repetitions and  Resistance Comments  Nustep  L4 x 17 minutes   Marching on foam  2 minutes   Tandem balance  4 x 30 seconds on firm surface   Step up  6" step x 3 minutes   LAQ 4# x 3 minutes   Standing hamstring curls  4# x 20 reps each   Seated hip ADD isometric  15 reps w/ 5 second hold     Blank cell = exercise not performed today                                     EXERCISE LOG               06-25-22  Exercise Repetitions and Resistance Comments  Nustep L4 x 20 min   LAQ 3#  BLE x30 reps   HS curl Green  theraband 2x15 reps Bil.   Clam Green theraband x30 reps   Ball squeeze X 10 hold 10 secs   Hip flexion  Standing 3x10 reps   Heel/toe raise Standing 3 x10 reps   Rocker Board X 3 mins   One step holds balance X 4 with each foot forward hold 30 secs    Blank cell = exercise not performed today   PATIENT EDUCATION:  Education details: POC, prognosis, strengthening, and goals for therapy  Person educated: Patient Education method: Explanation Education comprehension: verbalized  understanding  HOME EXERCISE PROGRAM:  ASSESSMENT:  CLINICAL IMPRESSION: Patient was progressed with step ups and other familiar interventions with moderate difficulty and fatigue. She required minimal cueing with step ups to alternate lower extremities to simulate a reciprocal pattern. She reported a mild medial knee discomfort with the NuStep. However, this did not limit her ability to complete any of today's interventions. She reported feeling fatigued upon the conclusion of treatment. She continues to require skilled physical therapy to address her remaining impairments to maximize her functional mobility.    OBJECTIVE IMPAIRMENTS: Abnormal gait, decreased activity tolerance, decreased balance, decreased mobility, difficulty walking, decreased strength, postural dysfunction, and pain.   ACTIVITY LIMITATIONS: carrying, lifting, standing, squatting, stairs, transfers, and locomotion level  PARTICIPATION  LIMITATIONS: meal prep, cleaning, shopping, and community activity  PERSONAL FACTORS: Time since onset of injury/illness/exacerbation and 3+ comorbidities: HTN, CHF, CAD, DM, history of breast cancer, OA, anxiety, depression, lymphedema, and history of bilateral TKA's   are also affecting patient's functional outcome.   REHAB POTENTIAL: Good  CLINICAL DECISION MAKING: Evolving/moderate complexity  EVALUATION COMPLEXITY: Moderate  GOALS: Goals reviewed with patient? Yes  SHORT TERM GOALS: Target date: 07/02/22 Patient will be independent with her initial HEP. Baseline: Goal status: Met  2.  Patient will improve her tandem stance to at least 15 seconds bilaterally for improved lower extremity stability. Baseline:  Goal status: Partially met  3.  Patient will be able to navigate at least 4 steps with a reciprocal pattern for improved household mobility. Baseline:  Goal status: Partially met  LONG TERM GOALS: Target date: 07/23/22  Patient will be independent with her advanced HEP. Baseline:  Goal status: INITIAL  2.  Patient will be able to walk for at least 15 minutes without being limited by fatigue. Baseline: Can only walk about 5  minutes currently. Goal status: INITIAL  3.  Patient will be able to navigate at least 8 steps with a reciprocal pattern for improved household mobility. Baseline: be able to navigate at least 8 step; currently using a step to pattern Goal status: INITIAL  4.  Patient will improve her 5 times sit to stand time to 12 seconds or less for improved lower extremity power. Baseline:  Goal status: INITIAL  PLAN:  PT FREQUENCY: 1-2x/week  PT DURATION: 6 weeks  PLANNED INTERVENTIONS: Therapeutic exercises, Therapeutic activity, Neuromuscular re-education, Balance training, Gait training, Patient/Family education, Self Care, Stair training, and Re-evaluation  PLAN FOR NEXT SESSION: NuStep, lower extremity strengthening, and balance  interventions   Darlin Coco, PT 06/27/2022, 12:33 PM

## 2022-07-02 ENCOUNTER — Ambulatory Visit: Payer: Medicare Other

## 2022-07-02 DIAGNOSIS — M6281 Muscle weakness (generalized): Secondary | ICD-10-CM

## 2022-07-02 DIAGNOSIS — R2681 Unsteadiness on feet: Secondary | ICD-10-CM | POA: Diagnosis not present

## 2022-07-02 NOTE — Therapy (Signed)
OUTPATIENT PHYSICAL THERAPY LOWER EXTREMITY TREATMENT   Patient Name: [name] MRN: [id number] DOB:[birth year], 72 y.o., female Today's Date: 07/02/2022  END OF SESSION:  PT End of Session - 07/02/22 0953     Visit Number 6    Number of Visits 12    Date for PT Re-Evaluation 08/16/22    PT Start Time 0945    PT Stop Time 1027    PT Time Calculation (min) 42 min    Activity Tolerance Patient tolerated treatment well    Behavior During Therapy [facility] for tasks assessed/performed             Past Medical History:  Diagnosis Date   Anemia 04/05/2012   Anxiety    Anxiety state 09/11/2007   Qualifier: Diagnosis of  By: Sherren Mocha RN, Dorian Pod     Asthma    Atrial tachycardia, paroxysmal    Back pain 10/02/2014   Breast cancer ([place]) 02/17/2017   CAD (coronary artery disease), native coronary artery    remote Cath with nonobstructive ASCAD with 20% mid LAD, 20% prox left circ and 20% ostial RCA   Chicken pox as a child   Chronic diastolic CHF (congestive heart failure), NYHA class 1 ([place])    Complication of anesthesia    pt states feels different in her body after anesthesia when waking up and also experiences a smell of burnt plastic for approx a wk    Constipation    Depression    Diabetes ([place])    Dilated aortic root ([place])    4m by echo 08/2020   Dysuria 02/17/2017   Elevated LFTs 04/05/2012   GERD (gastroesophageal reflux disease)    Goiter    Heart murmur    hx of one at birth    History of kidney stones    History of radiation therapy 10/31/16-12/17/16   left breast 45 Gy in 25 fractions, left breast boost 16 Gy in 8 fractions   Hx of colonic polyps    Hyperlipidemia    Hypertension    Insomnia 10/08/2016   Joint pain    Malignant neoplasm of upper-outer quadrant of left female breast ([place] 05/14/2016   not with patient   Measles as a child   Mumps as a child   OA (osteoarthritis) of knee 04/05/2012   Obesity 07/11/2014   PONV (postoperative nausea and vomiting)     Preventative health care 09/05/2013   PVC's (premature ventricular contractions) 05/02/2016   Shortness of breath dyspnea    walking distances / climbing stairs   Sleep apnea 04/05/2012   Tinea corporis 02/23/2013   Type 2 diabetes mellitus with hyperglycemia ([place] 12/31/2013   Vasomotor rhinitis 04/05/2012   Ventral hernia    Wheezing    Past Surgical History:  Procedure Laterality Date   BREAST LUMPECTOMY Left 2017   BREAST LUMPECTOMY WITH RADIOACTIVE SEED AND SENTINEL LYMPH NODE BIOPSY Left 06/21/2016   Procedure: LEFT BREAST LUMPECTOMY WITH RADIOACTIVE SEED AND SENTINEL LYMPH NODE BIOPSY, INJECT BLUE DYE LEFT BREAST;  Surgeon: HFanny Skates MD;  Location: [facility];  Service: General;  Laterality: Left;   CARDIAC CATHETERIZATION     normal coroary arteries per patient   CARDIOVASCULAR STRESS TEST     10/12/2013   CESAREAN SECTION     X 3   CHOLECYSTECTOMY     COLONOSCOPY WITH PROPOFOL N/A 03/28/2015   Procedure: COLONOSCOPY WITH PROPOFOL;  Surgeon: JJuanita Craver MD;  [facility];  Service: Endoscopy;  Laterality: N/A;  HERNIA REPAIR  02/08/2011   ventral hernia   JOINT REPLACEMENT     bilateral   KNEE ARTHROSCOPY  05/2010   bilateral   LEFT AND RIGHT HEART CATHETERIZATION WITH CORONARY ANGIOGRAM N/A 11/08/2013   Procedure: LEFT AND RIGHT HEART CATHETERIZATION WITH CORONARY ANGIOGRAM;  Surgeon: [name], MD;  Location: [facility] CATH LAB;  Service: Cardiovascular;  Laterality: N/A;   MENISCUS REPAIR  2009   MOUTH SURGERY     teeth implants   PORT-A-CATH REMOVAL N/A 01/24/2017   Procedure: REMOVAL PORT-A-CATH;  Surgeon: Fanny Skates, MD;  Location: [place];  Service: General;  Laterality: N/A;   PORTACATH PLACEMENT Right 07/16/2016   Procedure: INSERTION PORT-A-CATH RIGHT INTERNAL JUGULAR WITH ULTRASOUND;  Surgeon: Fanny Skates, MD;  Location: [place];  Service: General;  Laterality: Right;   REPAIR DURAL / CSF LEAK  2021   performed at [facility] in [place]  2011   left   TOTAL KNEE ARTHROPLASTY Right 05/10/2014   Procedure: RIGHT TOTAL KNEE ARTHROPLASTY;  Surgeon: Mauri Pole, MD;  [facility];  Service: Orthopedics;  Laterality: Right;   TOTAL THYROIDECTOMY Bilateral 2022   TUBAL LIGATION     WISDOM TOOTH EXTRACTION  2000   Patient Active Problem List   Diagnosis Date Noted   Asthma 06/11/2022   IDA (iron deficiency anemia) 04/30/2022   Lower GI bleed 04/29/2022   Allergic rhinitis 11/09/2021   Hx of papillary thyroid carcinoma 07/19/2021   Poorly controlled type 2 diabetes mellitus with circulatory disorder ([place]) 07/19/2021   Atypical chest pain 07/16/2021   Urinary incontinence 07/16/2021   Hypothyroidism 03/20/2021   Nausea 12/06/2020   Muscle cramps 12/06/2020   H/O thyroidectomy 08/30/2020   CSF leak from nose 01/13/2020   Uncontrolled type 2 diabetes mellitus with hyperglycemia ([place]) 04/14/2018   Vitamin D deficiency 03/30/2018   Dental infection 03/29/2018   Dysphagia 12/28/2017   Cough 12/28/2017   Peripheral neuropathy 09/22/2017   Lipoma 09/22/2017   Lymphedema 05/22/2017   Asthmatic bronchitis, mild intermittent, uncomplicated 25/10/3974   Breast cancer ([place]) 02/17/2017   Dysuria 02/17/2017   Insomnia 10/08/2016   Dilated aortic root ([place])    Port catheter in place 08/01/2016   Malignant neoplasm of upper-outer quadrant of left female breast ([place]) 05/14/2016   PVC's (premature ventricular contractions) 05/02/2016   Neck mass 04/21/2016   LVH (left ventricular hypertrophy) due to hypertensive disease, with heart failure ([place]) 04/18/2016   Tonsillar hypertrophy 07/07/2015   Back pain 10/02/2014   Obesity 07/11/2014   S/P right TKA 05/10/2014   S/P knee replacement 05/10/2014   Atrial tachycardia, paroxysmal 02/22/2014   Type 2 diabetes mellitus with hyperglycemia ([place]) 12/31/2013   Chronic diastolic CHF (congestive heart failure) ([place]) 11/22/2013   CAD  (coronary artery disease), native coronary artery 11/22/2013   Dyspnea on exertion 11/03/2013   Undiagnosed cardiac murmurs 09/05/2013   Preventative health care 09/05/2013   Musculoskeletal pain 11/10/2012   Vasomotor rhinitis 04/05/2012   Obstructive sleep apnea 04/05/2012   Anemia 04/05/2012   Elevated LFTs 04/05/2012   OA (osteoarthritis) of knee 04/05/2012   Hernia 10/13/2010   Hyperlipidemia 09/11/2007   Anxiety and depression 09/11/2007   Essential hypertension 09/11/2007   GERD 09/11/2007   RENAL CALCULUS 09/11/2007   RENAL CYST 09/11/2007   COLONIC POLYPS, HX OF 09/11/2007   REFERRING PROVIDER: [name], MD   REFERRING DIAG: Weakness of both lower extremities   THERAPY DIAG:  Muscle weakness (generalized)  Unsteadiness on feet  Rationale for Evaluation and Treatment: Rehabilitation  ONSET DATE: October 2023  SUBJECTIVE:   SUBJECTIVE STATEMENT:  Patient reports that she is hurting all over today. She feels that it due to the weather.   PERTINENT HISTORY: HTN, CHF, CAD, DM, history of breast cancer, OA, anxiety, depression, lymphedema, and history of bilateral TKA's   PAIN:  Are you having pain? Yes: NPRS scale: 5/10 Pain location: left posterior hip  Pain description: sharp Aggravating factors: rolling over in bed, transfers Relieving factors: heat  PRECAUTIONS: Fall  NEXT MD VISIT: 06/11/22  OBJECTIVE:   LOWER EXTREMITY MMT:  MMT Right eval Left eval  Hip flexion 3+/5 4-/5  Hip extension    Hip abduction    Hip adduction    Hip internal rotation    Hip external rotation    Knee flexion 4/5 4/5  Knee extension 4-/5 4-/5  Ankle dorsiflexion 4-/5 4-/5  Ankle plantarflexion    Ankle inversion    Ankle eversion     (Blank rows = not tested)  TODAY'S TREATMENT:                                                                                                                              DATE:                                     1/9  EXERCISE LOG  Exercise Repetitions and Resistance Comments  Nustep  L4 x 20 minutes   Seated hip ADD isometric 3 minutes w/ 5 second hold    Rocker board  4 minutes    Standing HS stretch 6 x 15 seconds each    Standing HS curl  Limited by left hip pain   LAQ 4# x 3 minutes    Blank cell = exercise not performed today                                    1/4 EXERCISE LOG  Exercise Repetitions and Resistance Comments  Nustep  L4 x 17 minutes   Marching on foam  2 minutes   Tandem balance  4 x 30 seconds on firm surface   Step up  6" step x 3 minutes   LAQ 4# x 3 minutes   Standing hamstring curls  4# x 20 reps each   Seated hip ADD isometric  15 reps w/ 5 second hold     Blank cell = exercise not performed today                                     EXERCISE LOG  06-25-22  Exercise Repetitions and Resistance Comments  Nustep L4 x 20 min   LAQ 3#  BLE x30 reps   HS curl Green  theraband 2x15 reps Bil.   Clam Green theraband x30 reps   Ball squeeze X 10 hold 10 secs   Hip flexion  Standing 3x10 reps   Heel/toe raise Standing 3 x10 reps   Rocker Board X 3 mins   One step holds balance X 4 with each foot forward hold 30 secs    Blank cell = exercise not performed today   PATIENT EDUCATION:  Education details: POC, prognosis, strengthening, and goals for therapy  Person educated: Patient Education method: Explanation Education comprehension: verbalized understanding  HOME EXERCISE PROGRAM:  ASSESSMENT:  CLINICAL IMPRESSION: Patient presented to treatment with increased global pain which she attributed to the bad weather. Treatment focused on familiar interventions for improved lower extremity strength with moderate difficulty. She required minimal cueing with today's interventions for proper exercise performance. She reported feeling better upon the conclusion of treatment. She continues to require skilled physical therapy to address her remaining impairments to  maximize her safety and functional mobility.   OBJECTIVE IMPAIRMENTS: Abnormal gait, decreased activity tolerance, decreased balance, decreased mobility, difficulty walking, decreased strength, postural dysfunction, and pain.   ACTIVITY LIMITATIONS: carrying, lifting, standing, squatting, stairs, transfers, and locomotion level  PARTICIPATION LIMITATIONS: meal prep, cleaning, shopping, and community activity  PERSONAL FACTORS: Time since onset of injury/illness/exacerbation and 3+ comorbidities: HTN, CHF, CAD, DM, history of breast cancer, OA, anxiety, depression, lymphedema, and history of bilateral TKA's   are also affecting patient's functional outcome.   REHAB POTENTIAL: Good  CLINICAL DECISION MAKING: Evolving/moderate complexity  EVALUATION COMPLEXITY: Moderate  GOALS: Goals reviewed with patient? Yes  SHORT TERM GOALS: Target date: 07/02/22 Patient will be independent with her initial HEP. Baseline: Goal status: Met  2.  Patient will improve her tandem stance to at least 15 seconds bilaterally for improved lower extremity stability. Baseline:  Goal status: Partially met  3.  Patient will be able to navigate at least 4 steps with a reciprocal pattern for improved household mobility. Baseline:  Goal status: Partially met  LONG TERM GOALS: Target date: 07/23/22  Patient will be independent with her advanced HEP. Baseline:  Goal status: INITIAL  2.  Patient will be able to walk for at least 15 minutes without being limited by fatigue. Baseline: Can only walk about 5  minutes currently. Goal status: INITIAL  3.  Patient will be able to navigate at least 8 steps with a reciprocal pattern for improved household mobility. Baseline: be able to navigate at least 8 step; currently using a step to pattern Goal status: INITIAL  4.  Patient will improve her 5 times sit to stand time to 12 seconds or less for improved lower extremity power. Baseline:  Goal status:  INITIAL  PLAN:  PT FREQUENCY: 1-2x/week  PT DURATION: 6 weeks  PLANNED INTERVENTIONS: Therapeutic exercises, Therapeutic activity, Neuromuscular re-education, Balance training, Gait training, Patient/Family education, Self Care, Stair training, and Re-evaluation  PLAN FOR NEXT SESSION: NuStep, lower extremity strengthening, and balance interventions   Darlin Coco, PT 07/02/2022, 10:52 AM

## 2022-07-03 NOTE — Telephone Encounter (Signed)
Hello I have sent several fax for provider to review and sign and I have not receive them back I  am going to re fax the provider portion and Put Attention to you if that is ok so we can make sure the provider get it. Can you fax back to me as soon as he has review and sign it [phone] ATTENTION TO [facility]

## 2022-07-03 NOTE — Telephone Encounter (Signed)
Will fax paperwork back when  We receives them

## 2022-07-04 ENCOUNTER — Encounter: Payer: Self-pay | Admitting: Physical Therapy

## 2022-07-04 ENCOUNTER — Ambulatory Visit: Payer: Medicare Other | Admitting: Physical Therapy

## 2022-07-04 DIAGNOSIS — R2681 Unsteadiness on feet: Secondary | ICD-10-CM

## 2022-07-04 DIAGNOSIS — M6281 Muscle weakness (generalized): Secondary | ICD-10-CM

## 2022-07-04 NOTE — Therapy (Signed)
OUTPATIENT PHYSICAL THERAPY LOWER EXTREMITY TREATMENT   Patient Name: [name] MRN: [id number] DOB:1950-08-21, 72 y.o., female Today's Date: 07/04/2022  END OF SESSION:  PT End of Session - 07/04/22 1352     Visit Number 7    Number of Visits 12    Date for PT Re-Evaluation 08/16/22    PT Start Time 1351    PT Stop Time 1426    PT Time Calculation (min) 35 min    Activity Tolerance Patient tolerated treatment well    Behavior During Therapy [facility] for tasks assessed/performed            Past Medical History:  Diagnosis Date   Anemia 04/05/2012   Anxiety    Anxiety state 09/11/2007   Qualifier: Diagnosis of  By: Sherren Mocha RN, Dorian Pod     Asthma    Atrial tachycardia, paroxysmal    Back pain 10/02/2014   Breast cancer ([place]) 02/17/2017   CAD (coronary artery disease), native coronary artery    remote Cath with nonobstructive ASCAD with 20% mid LAD, 20% prox left circ and 20% ostial RCA   Chicken pox as a child   Chronic diastolic CHF (congestive heart failure), NYHA class 1 ([place])    Complication of anesthesia    pt states feels different in her body after anesthesia when waking up and also experiences a smell of burnt plastic for approx a wk    Constipation    Depression    Diabetes ([place])    Dilated aortic root ([place])    29m by echo 08/2020   Dysuria 02/17/2017   Elevated LFTs 04/05/2012   GERD (gastroesophageal reflux disease)    Goiter    Heart murmur    hx of one at birth    History of kidney stones    History of radiation therapy 10/31/16-12/17/16   left breast 45 Gy in 25 fractions, left breast boost 16 Gy in 8 fractions   Hx of colonic polyps    Hyperlipidemia    Hypertension    Insomnia 10/08/2016   Joint pain    Malignant neoplasm of upper-outer quadrant of left female breast ([place] 05/14/2016   not with patient   Measles as a child   Mumps as a child   OA (osteoarthritis) of knee 04/05/2012   Obesity 07/11/2014   PONV (postoperative nausea and vomiting)     Preventative health care 09/05/2013   PVC's (premature ventricular contractions) 05/02/2016   Shortness of breath dyspnea    walking distances / climbing stairs   Sleep apnea 04/05/2012   Tinea corporis 02/23/2013   Type 2 diabetes mellitus with hyperglycemia ([place] 12/31/2013   Vasomotor rhinitis 04/05/2012   Ventral hernia    Wheezing    Past Surgical History:  Procedure Laterality Date   BREAST LUMPECTOMY Left 2017   BREAST LUMPECTOMY WITH RADIOACTIVE SEED AND SENTINEL LYMPH NODE BIOPSY Left 06/21/2016   Procedure: LEFT BREAST LUMPECTOMY WITH RADIOACTIVE SEED AND SENTINEL LYMPH NODE BIOPSY, INJECT BLUE DYE LEFT BREAST;  Surgeon: HFanny Skates MD;  [facility];  Service: General;  Laterality: Left;   CARDIAC CATHETERIZATION     normal coroary arteries per patient   CARDIOVASCULAR STRESS TEST     10/12/2013   CESAREAN SECTION     X 3   CHOLECYSTECTOMY     COLONOSCOPY WITH PROPOFOL N/A 03/28/2015   Procedure: COLONOSCOPY WITH PROPOFOL;  Surgeon: JJuanita Craver MD;  [facility];  Service: Endoscopy;  Laterality: N/A;   HERNIA  REPAIR  02/08/2011   ventral hernia   JOINT REPLACEMENT     bilateral   KNEE ARTHROSCOPY  05/2010   bilateral   LEFT AND RIGHT HEART CATHETERIZATION WITH CORONARY ANGIOGRAM N/A 11/08/2013   Procedure: LEFT AND RIGHT HEART CATHETERIZATION WITH CORONARY ANGIOGRAM;  Surgeon: [name], MD;  Location: [facility] CATH LAB;  Service: Cardiovascular;  Laterality: N/A;   MENISCUS REPAIR  2009   MOUTH SURGERY     teeth implants   PORT-A-CATH REMOVAL N/A 01/24/2017   Procedure: REMOVAL PORT-A-CATH;  Surgeon: Fanny Skates, MD;  Location: [facility];  Service: General;  Laterality: N/A;   PORTACATH PLACEMENT Right 07/16/2016   Procedure: INSERTION PORT-A-CATH RIGHT INTERNAL JUGULAR WITH ULTRASOUND;  Surgeon: Fanny Skates, MD;  Location: [place];  Service: General;  Laterality: Right;   REPAIR DURAL / CSF LEAK  2021   performed at [facility] in [place]  2011   left   TOTAL KNEE ARTHROPLASTY Right 05/10/2014   Procedure: RIGHT TOTAL KNEE ARTHROPLASTY;  Surgeon: Mauri Pole, MD;  [facility];  Service: Orthopedics;  Laterality: Right;   TOTAL THYROIDECTOMY Bilateral 2022   TUBAL LIGATION     WISDOM TOOTH EXTRACTION  2000   Patient Active Problem List   Diagnosis Date Noted   Asthma 06/11/2022   IDA (iron deficiency anemia) 04/30/2022   Lower GI bleed 04/29/2022   Allergic rhinitis 11/09/2021   Hx of papillary thyroid carcinoma 07/19/2021   Poorly controlled type 2 diabetes mellitus with circulatory disorder ([place]) 07/19/2021   Atypical chest pain 07/16/2021   Urinary incontinence 07/16/2021   Hypothyroidism 03/20/2021   Nausea 12/06/2020   Muscle cramps 12/06/2020   H/O thyroidectomy 08/30/2020   CSF leak from nose 01/13/2020   Uncontrolled type 2 diabetes mellitus with hyperglycemia ([place]) 04/14/2018   Vitamin D deficiency 03/30/2018   Dental infection 03/29/2018   Dysphagia 12/28/2017   Cough 12/28/2017   Peripheral neuropathy 09/22/2017   Lipoma 09/22/2017   Lymphedema 05/22/2017   Asthmatic bronchitis, mild intermittent, uncomplicated 54/27/0623   Breast cancer ([place]) 02/17/2017   Dysuria 02/17/2017   Insomnia 10/08/2016   Dilated aortic root ([place])    Port catheter in place 08/01/2016   Malignant neoplasm of upper-outer quadrant of left female breast ([place]) 05/14/2016   PVC's (premature ventricular contractions) 05/02/2016   Neck mass 04/21/2016   LVH (left ventricular hypertrophy) due to hypertensive disease, with heart failure ([place]) 04/18/2016   Tonsillar hypertrophy 07/07/2015   Back pain 10/02/2014   Obesity 07/11/2014   S/P right TKA 05/10/2014   S/P knee replacement 05/10/2014   Atrial tachycardia, paroxysmal 02/22/2014   Type 2 diabetes mellitus with hyperglycemia ([place]) 12/31/2013   Chronic diastolic CHF (congestive heart failure) ([place]) 11/22/2013   CAD  (coronary artery disease), native coronary artery 11/22/2013   Dyspnea on exertion 11/03/2013   Undiagnosed cardiac murmurs 09/05/2013   Preventative health care 09/05/2013   Musculoskeletal pain 11/10/2012   Vasomotor rhinitis 04/05/2012   Obstructive sleep apnea 04/05/2012   Anemia 04/05/2012   Elevated LFTs 04/05/2012   OA (osteoarthritis) of knee 04/05/2012   Hernia 10/13/2010   Hyperlipidemia 09/11/2007   Anxiety and depression 09/11/2007   Essential hypertension 09/11/2007   GERD 09/11/2007   RENAL CALCULUS 09/11/2007   RENAL CYST 09/11/2007   COLONIC POLYPS, HX OF 09/11/2007   REFERRING PROVIDER: [name], MD   REFERRING DIAG: Weakness of both lower extremities   THERAPY DIAG:  Muscle weakness (generalized)  Unsteadiness on feet  Rationale for Evaluation and Treatment: Rehabilitation  ONSET DATE: October 2023  SUBJECTIVE:   SUBJECTIVE STATEMENT:  Fatigued as she went to the grocery store and shopped for her groceries instead of pick up. Patient also fatigued once she unloaded her groceries at home as well.  PERTINENT HISTORY: HTN, CHF, CAD, DM, history of breast cancer, OA, anxiety, depression, lymphedema, and history of bilateral TKA's   PAIN:  Are you having pain? Yes: NPRS scale: no score provided/10 Pain location: left posterior hip  Pain description: sharp Aggravating factors: rolling over in bed, transfers Relieving factors: heat  PRECAUTIONS: Fall  NEXT MD VISIT: 06/11/22  OBJECTIVE:   LOWER EXTREMITY MMT:  MMT Right eval Left eval  Hip flexion 3+/5 4-/5  Hip extension    Hip abduction    Hip adduction    Hip internal rotation    Hip external rotation    Knee flexion 4/5 4/5  Knee extension 4-/5 4-/5  Ankle dorsiflexion 4-/5 4-/5  Ankle plantarflexion    Ankle inversion    Ankle eversion     (Blank rows = not tested)  TODAY'S TREATMENT:                                                                                                                               DATE:                                    1/11 EXERCISE LOG  Exercise Repetitions and Resistance Comments  Nustep  L4 x 25 minutes   Heel/toe raises X20 reps   Hip flexion X20 reps   Hip abduction 2x10 reps   Forward step ups  6" step x20 reps   LAQ 4# x20 reps   HS curl Red x20 reps   Clam  X30 reps   Sit to stands X10 reps    Blank cell = exercise not performed today   PATIENT EDUCATION:  Education details: POC, prognosis, strengthening, and goals for therapy  Person educated: Patient Education method: Explanation Education comprehension: verbalized understanding  HOME EXERCISE PROGRAM:  ASSESSMENT:  CLINICAL IMPRESSION: Patient presented in clinic with only reports of generalized fatigue after going into the grocery store for the first time. Patient able to tolerate progression of exercises to more standing exercises with continued reports of generalized fatigue. Able to complete sit <> stands well with minimal UE support.  OBJECTIVE IMPAIRMENTS: Abnormal gait, decreased activity tolerance, decreased balance, decreased mobility, difficulty walking, decreased strength, postural dysfunction, and pain.   ACTIVITY LIMITATIONS: carrying, lifting, standing, squatting, stairs, transfers, and locomotion level  PARTICIPATION LIMITATIONS: meal prep, cleaning, shopping, and community activity  PERSONAL FACTORS: Time since onset of injury/illness/exacerbation and 3+ comorbidities: HTN, CHF, CAD, DM, history of breast cancer, OA, anxiety, depression, lymphedema, and history of bilateral TKA's   are also  affecting patient's functional outcome.   REHAB POTENTIAL: Good  CLINICAL DECISION MAKING: Evolving/moderate complexity  EVALUATION COMPLEXITY: Moderate  GOALS: Goals reviewed with patient? Yes  SHORT TERM GOALS: Target date: 07/02/22 Patient will be independent with her initial HEP. Baseline: Goal status: Met  2.  Patient will improve her  tandem stance to at least 15 seconds bilaterally for improved lower extremity stability. Baseline:  Goal status: Partially met  3.  Patient will be able to navigate at least 4 steps with a reciprocal pattern for improved household mobility. Baseline:  Goal status: Partially met  LONG TERM GOALS: Target date: 07/23/22  Patient will be independent with her advanced HEP. Baseline:  Goal status: INITIAL  2.  Patient will be able to walk for at least 15 minutes without being limited by fatigue. Baseline: Can only walk about 5  minutes currently. Goal status: INITIAL  3.  Patient will be able to navigate at least 8 steps with a reciprocal pattern for improved household mobility. Baseline: be able to navigate at least 8 step; currently using a step to pattern Goal status: INITIAL  4.  Patient will improve her 5 times sit to stand time to 12 seconds or less for improved lower extremity power. Baseline:  Goal status: INITIAL  PLAN:  PT FREQUENCY: 1-2x/week  PT DURATION: 6 weeks  PLANNED INTERVENTIONS: Therapeutic exercises, Therapeutic activity, Neuromuscular re-education, Balance training, Gait training, Patient/Family education, Self Care, Stair training, and Re-evaluation  PLAN FOR NEXT SESSION: NuStep, lower extremity strengthening, and balance interventions  Standley Brooking, PTA 07/04/2022, 5:22 PM

## 2022-07-06 ENCOUNTER — Other Ambulatory Visit: Payer: Self-pay | Admitting: Family Medicine

## 2022-07-08 ENCOUNTER — Telehealth: Payer: Self-pay | Admitting: Family Medicine

## 2022-07-08 ENCOUNTER — Other Ambulatory Visit: Payer: Self-pay

## 2022-07-08 DIAGNOSIS — E119 Type 2 diabetes mellitus without complications: Secondary | ICD-10-CM

## 2022-07-08 MED ORDER — GLUCOSE BLOOD VI STRP: ORAL_STRIP | 5 refills | Status: DC

## 2022-07-08 NOTE — Telephone Encounter (Signed)
Prescription Request  07/08/2022  Is this a "Controlled Substance" medicine? No  LOV: Visit date not found  What is the name of the medication or equipment?   Lancets for Accu-Chek Aviva  Have you contacted your pharmacy to request a refill? No   Which pharmacy would you like this sent to?   [facility], [place] [address] Phone: [phone] Fax: [phone]    Patient notified that their request is being sent to the clinical staff for review and that they should receive a response within 2 business days.   Please advise at Mobile [phone] (mobile)

## 2022-07-08 NOTE — Telephone Encounter (Signed)
Medication sent

## 2022-07-09 ENCOUNTER — Ambulatory Visit: Payer: Medicare Other | Admitting: Physical Therapy

## 2022-07-09 DIAGNOSIS — M6281 Muscle weakness (generalized): Secondary | ICD-10-CM | POA: Diagnosis not present

## 2022-07-09 DIAGNOSIS — R2681 Unsteadiness on feet: Secondary | ICD-10-CM | POA: Diagnosis not present

## 2022-07-09 NOTE — Telephone Encounter (Signed)
Submitted application for OZEMPIC  and TRESIBA  to [place] for patient assistance.   Phone: [facility] Patient Advocate

## 2022-07-09 NOTE — Therapy (Signed)
OUTPATIENT PHYSICAL THERAPY LOWER EXTREMITY TREATMENT   Patient Name: [name] MRN: [id number] DOB:28-Jul-1950, 72 y.o., female Today's Date: 07/09/2022  END OF SESSION:  PT End of Session - 07/09/22 0912     Visit Number 8    Number of Visits 12    Date for PT Re-Evaluation 08/16/22    PT Start Time 0900    PT Stop Time 5009    PT Time Calculation (min) 41 min    Activity Tolerance Patient tolerated treatment well    Behavior During Therapy [facility] for tasks assessed/performed            Past Medical History:  Diagnosis Date   Anemia 04/05/2012   Anxiety    Anxiety state 09/11/2007   Qualifier: Diagnosis of  By: Sherren Mocha RN, Dorian Pod     Asthma    Atrial tachycardia, paroxysmal    Back pain 10/02/2014   Breast cancer ([place]) 02/17/2017   CAD (coronary artery disease), native coronary artery    remote Cath with nonobstructive ASCAD with 20% mid LAD, 20% prox left circ and 20% ostial RCA   Chicken pox as a child   Chronic diastolic CHF (congestive heart failure), NYHA class 1 ([place])    Complication of anesthesia    pt states feels different in her body after anesthesia when waking up and also experiences a smell of burnt plastic for approx a wk    Constipation    Depression    Diabetes ([place])    Dilated aortic root ([place])    35m by echo 08/2020   Dysuria 02/17/2017   Elevated LFTs 04/05/2012   GERD (gastroesophageal reflux disease)    Goiter    Heart murmur    hx of one at birth    History of kidney stones    History of radiation therapy 10/31/16-12/17/16   left breast 45 Gy in 25 fractions, left breast boost 16 Gy in 8 fractions   Hx of colonic polyps    Hyperlipidemia    Hypertension    Insomnia 10/08/2016   Joint pain    Malignant neoplasm of upper-outer quadrant of left female breast ([place] 05/14/2016   not with patient   Measles as a child   Mumps as a child   OA (osteoarthritis) of knee 04/05/2012   Obesity 07/11/2014   PONV (postoperative nausea and vomiting)     Preventative health care 09/05/2013   PVC's (premature ventricular contractions) 05/02/2016   Shortness of breath dyspnea    walking distances / climbing stairs   Sleep apnea 04/05/2012   Tinea corporis 02/23/2013   Type 2 diabetes mellitus with hyperglycemia ([place] 12/31/2013   Vasomotor rhinitis 04/05/2012   Ventral hernia    Wheezing    Past Surgical History:  Procedure Laterality Date   BREAST LUMPECTOMY Left 2017   BREAST LUMPECTOMY WITH RADIOACTIVE SEED AND SENTINEL LYMPH NODE BIOPSY Left 06/21/2016   Procedure: LEFT BREAST LUMPECTOMY WITH RADIOACTIVE SEED AND SENTINEL LYMPH NODE BIOPSY, INJECT BLUE DYE LEFT BREAST;  Surgeon: HFanny Skates MD;  Location: [facility] OR;  Service: General;  Laterality: Left;   CARDIAC CATHETERIZATION     normal coroary arteries per patient   CARDIOVASCULAR STRESS TEST     10/12/2013   CESAREAN SECTION     X 3   CHOLECYSTECTOMY     COLONOSCOPY WITH PROPOFOL N/A 03/28/2015   Procedure: COLONOSCOPY WITH PROPOFOL;  Surgeon: JJuanita Craver MD;  Location: [facility] ENDOSCOPY;  Service: Endoscopy;  Laterality: N/A;   HERNIA  REPAIR  02/08/2011   ventral hernia   JOINT REPLACEMENT     bilateral   KNEE ARTHROSCOPY  05/2010   bilateral   LEFT AND RIGHT HEART CATHETERIZATION WITH CORONARY ANGIOGRAM N/A 11/08/2013   Procedure: LEFT AND RIGHT HEART CATHETERIZATION WITH CORONARY ANGIOGRAM;  Surgeon: [name], MD;  Location: [facility] CATH LAB;  Service: Cardiovascular;  Laterality: N/A;   MENISCUS REPAIR  2009   MOUTH SURGERY     teeth implants   PORT-A-CATH REMOVAL N/A 01/24/2017   Procedure: REMOVAL PORT-A-CATH;  Surgeon: Fanny Skates, MD;  Location: [place];  Service: General;  Laterality: N/A;   PORTACATH PLACEMENT Right 07/16/2016   Procedure: INSERTION PORT-A-CATH RIGHT INTERNAL JUGULAR WITH ULTRASOUND;  Surgeon: Fanny Skates, MD;  Location: [place];  Service: General;  Laterality: Right;   REPAIR DURAL / CSF LEAK  2021   performed at [facility] in [place]  2011   left   TOTAL KNEE ARTHROPLASTY Right 05/10/2014   Procedure: RIGHT TOTAL KNEE ARTHROPLASTY;  Surgeon: Mauri Pole, MD;  Location: [facility] ORS;  Service: Orthopedics;  Laterality: Right;   TOTAL THYROIDECTOMY Bilateral 2022   TUBAL LIGATION     WISDOM TOOTH EXTRACTION  2000   Patient Active Problem List   Diagnosis Date Noted   Asthma 06/11/2022   IDA (iron deficiency anemia) 04/30/2022   Lower GI bleed 04/29/2022   Allergic rhinitis 11/09/2021   Hx of papillary thyroid carcinoma 07/19/2021   Poorly controlled type 2 diabetes mellitus with circulatory disorder ([place]) 07/19/2021   Atypical chest pain 07/16/2021   Urinary incontinence 07/16/2021   Hypothyroidism 03/20/2021   Nausea 12/06/2020   Muscle cramps 12/06/2020   H/O thyroidectomy 08/30/2020   CSF leak from nose 01/13/2020   Uncontrolled type 2 diabetes mellitus with hyperglycemia ([place]) 04/14/2018   Vitamin D deficiency 03/30/2018   Dental infection 03/29/2018   Dysphagia 12/28/2017   Cough 12/28/2017   Peripheral neuropathy 09/22/2017   Lipoma 09/22/2017   Lymphedema 05/22/2017   Asthmatic bronchitis, mild intermittent, uncomplicated 30/86/5784   Breast cancer ([place]) 02/17/2017   Dysuria 02/17/2017   Insomnia 10/08/2016   Dilated aortic root ([place])    Port catheter in place 08/01/2016   Malignant neoplasm of upper-outer quadrant of left female breast ([place]) 05/14/2016   PVC's (premature ventricular contractions) 05/02/2016   Neck mass 04/21/2016   LVH (left ventricular hypertrophy) due to hypertensive disease, with heart failure ([place]) 04/18/2016   Tonsillar hypertrophy 07/07/2015   Back pain 10/02/2014   Obesity 07/11/2014   S/P right TKA 05/10/2014   S/P knee replacement 05/10/2014   Atrial tachycardia, paroxysmal 02/22/2014   Type 2 diabetes mellitus with hyperglycemia ([place]) 12/31/2013   Chronic diastolic CHF (congestive heart failure) ([place]) 11/22/2013   CAD  (coronary artery disease), native coronary artery 11/22/2013   Dyspnea on exertion 11/03/2013   Undiagnosed cardiac murmurs 09/05/2013   Preventative health care 09/05/2013   Musculoskeletal pain 11/10/2012   Vasomotor rhinitis 04/05/2012   Obstructive sleep apnea 04/05/2012   Anemia 04/05/2012   Elevated LFTs 04/05/2012   OA (osteoarthritis) of knee 04/05/2012   Hernia 10/13/2010   Hyperlipidemia 09/11/2007   Anxiety and depression 09/11/2007   Essential hypertension 09/11/2007   GERD 09/11/2007   RENAL CALCULUS 09/11/2007   RENAL CYST 09/11/2007   COLONIC POLYPS, HX OF 09/11/2007   REFERRING PROVIDER: [name], MD   REFERRING DIAG: Weakness of both lower extremities   THERAPY DIAG:  Muscle weakness (generalized)  Unsteadiness on feet  Rationale for Evaluation and Treatment: Rehabilitation  ONSET DATE: October 2023  SUBJECTIVE:   SUBJECTIVE STATEMENT:  No new complaints.  PERTINENT HISTORY: HTN, CHF, CAD, DM, history of breast cancer, OA, anxiety, depression, lymphedema, and history of bilateral TKA's   PAIN:  Are you having pain? Yes: NPRS scale: no score provided/10 Pain location: left posterior hip  Pain description: sharp Aggravating factors: rolling over in bed, transfers Relieving factors: heat  PRECAUTIONS: Fall  NEXT MD VISIT: 06/11/22  OBJECTIVE:   LOWER EXTREMITY MMT:  MMT Right eval Left eval  Hip flexion 3+/5 4-/5  Hip extension    Hip abduction    Hip adduction    Hip internal rotation    Hip external rotation    Knee flexion 4/5 4/5  Knee extension 4-/5 4-/5  Ankle dorsiflexion 4-/5 4-/5  Ankle plantarflexion    Ankle inversion    Ankle eversion     (Blank rows = not tested)  TODAY'S TREATMENT:                                                                                                                              DATE:                                    07/09/12 EXERCISE LOG  Exercise Repetitions and Resistance  Comments  Nustep  L4 x 18 minutes   Rockerboard Forward/Back 3 minutes   Rockerboard side to side 3 minutes   Knee ext 10# x 3 minutes   Ham curls 30# x 3 minutes   Blue XTS walking Forward 2 minutes and backward 2 minutes.   Inverted BOSU 4 minutes            Blank cell = exercise not performed today   PATIENT EDUCATION:  Education details: POC, prognosis, strengthening, and goals for therapy  Person educated: Patient Education method: Explanation Education comprehension: verbalized understanding  HOME EXERCISE PROGRAM:  ASSESSMENT:  CLINICAL IMPRESSION: Excellent job today with the addition of balance activities and weight machines.  Rockerboard and inverted BOSU performed in parallel bars with hand usage as needed.    OBJECTIVE IMPAIRMENTS: Abnormal gait, decreased activity tolerance, decreased balance, decreased mobility, difficulty walking, decreased strength, postural dysfunction, and pain.   ACTIVITY LIMITATIONS: carrying, lifting, standing, squatting, stairs, transfers, and locomotion level  PARTICIPATION LIMITATIONS: meal prep, cleaning, shopping, and community activity  PERSONAL FACTORS: Time since onset of injury/illness/exacerbation and 3+ comorbidities: HTN, CHF, CAD, DM, history of breast cancer, OA, anxiety, depression, lymphedema, and history of bilateral TKA's   are also affecting patient's functional outcome.   REHAB POTENTIAL: Good  CLINICAL DECISION MAKING: Evolving/moderate complexity  EVALUATION COMPLEXITY: Moderate  GOALS: Goals reviewed with patient? Yes  SHORT TERM GOALS: Target date: 07/02/22 Patient will be independent with her initial HEP. Baseline: Goal status:  Met  2.  Patient will improve her tandem stance to at least 15 seconds bilaterally for improved lower extremity stability. Baseline:  Goal status: Partially met  3.  Patient will be able to navigate at least 4 steps with a reciprocal pattern for improved household  mobility. Baseline:  Goal status: Partially met  LONG TERM GOALS: Target date: 07/23/22  Patient will be independent with her advanced HEP. Baseline:  Goal status: INITIAL  2.  Patient will be able to walk for at least 15 minutes without being limited by fatigue. Baseline: Can only walk about 5  minutes currently. Goal status: INITIAL  3.  Patient will be able to navigate at least 8 steps with a reciprocal pattern for improved household mobility. Baseline: be able to navigate at least 8 step; currently using a step to pattern Goal status: INITIAL  4.  Patient will improve her 5 times sit to stand time to 12 seconds or less for improved lower extremity power. Baseline:  Goal status: INITIAL  PLAN:  PT FREQUENCY: 1-2x/week  PT DURATION: 6 weeks  PLANNED INTERVENTIONS: Therapeutic exercises, Therapeutic activity, Neuromuscular re-education, Balance training, Gait training, Patient/Family education, Self Care, Stair training, and Re-evaluation  PLAN FOR NEXT SESSION: NuStep, lower extremity strengthening, and balance interventions  Richey Doolittle, [place], PT 07/09/2022, 10:15 AM

## 2022-07-10 ENCOUNTER — Ambulatory Visit: Payer: Medicare Other | Admitting: Pharmacist

## 2022-07-10 DIAGNOSIS — E1165 Type 2 diabetes mellitus with hyperglycemia: Secondary | ICD-10-CM

## 2022-07-10 DIAGNOSIS — F32A Depression, unspecified: Secondary | ICD-10-CM

## 2022-07-10 DIAGNOSIS — E782 Mixed hyperlipidemia: Secondary | ICD-10-CM

## 2022-07-10 DIAGNOSIS — I10 Essential (primary) hypertension: Secondary | ICD-10-CM

## 2022-07-10 MED ORDER — LEVOTHYROXINE SODIUM 200 MCG PO TABS: 200.0000 ug | ORAL_TABLET | Freq: Every day | ORAL | 1 refills | Status: DC

## 2022-07-10 MED ORDER — ACCU-CHEK SOFTCLIX LANCETS MISC: 5 refills | Status: DC

## 2022-07-10 NOTE — Progress Notes (Signed)
Pharmacy Note  07/10/2022 Name: [name] MRN: [id number] DOB: 1950-08-26  Subjective: [name] is a 72 y.o. year old female who is a primary care patient of [name], MD. Clinical Pharmacist Practitioner referral was placed to assist with diabetes / medication management.    Engaged with patient by telephone for follow up visit today.  Type 2 DM: Patient has restarted Ozempic '1mg'$  weekly. She had stopped for about 3 to 4 weeks due to surgery / GI bleed. She is also taking [place] 12 units daily and metformin ER '500mg'$  4 tablets daily.We have applied for medication assistance program for [name] and Ozempic for 2024 but have not received notice of approval or denial from [employer] yet. Patient endorses that she has at least 2 months of Ozempic an [place] on hand.  Patient has not been checking blood glucose reacently due to being out of lancets. She needs Rx sent to pharmacy. Anxiety: She reports that buspirone is helping a lot to control anxiety. She is taking usually on twice daily. Also taking escitalopram '20mg'$  daily.   Medication Management - reviewed med list and refill history.  Patient has not been taking ferrous fumarate. She did retry it after our last visit in December at the request of hematology clinic but she stopped after about 2 weeks because it was causing bloating and constipation. She had iron infusion 05/02/2022. Last ferritin, iron and Sat ratios was improved and in normal range.  Hypertension:  Checking blood pressure at home about 3 or 4 times per week.  SBP usually 130 to 142 and DBP 70 to 84.  Taking diltiazem '120mg'$  daily, furosemide '20mg'$  daily, losartan '100mg'$  daily and carvedilol '25mg'$  twice a day.  Hyperlipidemia: Taking rosuvastatin '40mg'$  daily with ezetimibe '10mg'$  daily. Last LDL was 52 but triglycerides were above goal at 227 and HDLC was low at 31. Patient has been following a plant based diet and has lost a significant amount of weight  since her last lipid panel in July 2023.   Recent Office Visits:  05/21/2022 - GI (Dr Collene Mares) Seen for iron def anemia.  05/21/2022 - Hem / Onc (Dr Soyla Dryer) surveillance of breast cancer. Taking anastrozole '1mg'$  daily since 04/29/2016. 05/07/2022 - otolaryngology at [facility]. continue to rinse with saline at least BID and continue nasal steroid sprays, 2 sprays in each nostril, BID. She can resume CPAP at this time.  Objective: Review of patient status, including review of consultants reports, laboratory and other test data, was performed as part of comprehensive evaluation and provision of chronic care management services.   Lab Results  Component Value Date   CREATININE 0.54 04/29/2022   CREATININE 0.65 04/24/2022   CREATININE 0.82 01/22/2022    Lab Results  Component Value Date   HGBA1C 5.8 (A) 06/20/2022       Component Value Date/Time   CHOL 120 01/15/2022 0753   TRIG 227 (H) 01/15/2022 0753   HDL 31 (L) 01/15/2022 0753   CHOLHDL 3.9 01/15/2022 0753   CHOLHDL 3 07/16/2021 1213   VLDL 42.4 (H) 07/16/2021 1213   LDLCALC 52 01/15/2022 0753   LDLCALC 67 06/02/2020 1427   LDLDIRECT 56.0 07/16/2021 1213     Clinical ASCVD: Yes  The ASCVD Risk score (Arnett DK, et al., 2019) failed to calculate for the following reasons:   The valid total cholesterol range is 130 to 320 mg/dL    BP Readings from Last 3 Encounters:  06/21/22 126/86  06/20/22 (!) 132/92  06/11/22 (!) 143/74     Allergies  Allergen Reactions   Oxycodone Other (See Comments)    Does not feel good. Makes her sleepy.    Medications Reviewed Today     Reviewed by Cherre Robins, RPH-CPP (Pharmacist) on 07/10/22 at 39  Med List Status: <None>   Medication Order Taking? Sig Documenting Provider Last Dose Status Informant  acetaminophen (TYLENOL) 500 MG tablet [id number] Yes Take 1,000 mg by mouth 2 (two) times daily as needed for moderate pain or headache. [provider] Taking Active Self  albuterol  (VENTOLIN HFA) 108 (90 Base) MCG/ACT inhaler [id number] Yes Inhale 2 puffs every 4-6 hours as needed - rescue. Deneise Lever, MD Taking Active   anastrozole (ARIMIDEX) 1 MG tablet [id number] Yes Take 1 tablet (1 mg total) by mouth daily. Nicholas Lose, MD Taking Active   aspirin EC 81 MG tablet [id number] Yes Take 81 mg by mouth daily. Swallow whole. [provider] Taking Active   blood glucose meter kit and supplies [id number] Yes Dispense based on patient and insurance preference. Use up to four times daily as directed. (FOR ICD-10 E10.9, E11.9). [name], MD Taking Active   budesonide-formoterol [facility]) 160-4.5 MCG/ACT inhaler [id number] Yes Inhale 2 puffs into the lungs 2 (two) times daily. As needed [provider] Taking Active            Med Note Antony Contras, [place] B   Fri Nov 30, 2021  4:14 PM) Approved to receive from [facility] and me program thru 06/23/2022  busPIRone (BUSPAR) 7.5 MG tablet [id number] Yes Take 1 tablet (7.5 mg total) by mouth 3 (three) times daily. As needed for anxiety Debbrah Alar, NP Taking Active   carvedilol (COREG) 25 MG tablet [id number] Yes Take 1 tablet (25 mg total) by mouth 2 (two) times daily with a meal. [name], MD Taking Active   diltiazem (CARDIZEM CD) 120 MG 24 hr capsule [id number] Yes TAKE ONE CAPSULE BY MOUTH DAILY Debbrah Alar, NP Taking Active   escitalopram (LEXAPRO) 20 MG tablet [id number] Yes TAKE ONE TABLET BY MOUTH EVERY MORNING AND ONE-HALF TABLET EVERY Rocky Morel, NP Taking Active   esomeprazole (NEXIUM) 40 MG capsule [id number] Yes Take 1 capsule (40 mg total) by mouth daily. [name], MD Taking Active   ezetimibe (ZETIA) 10 MG tablet [id number] Yes Take 1 tablet (10 mg total) by mouth daily. [name], MD Taking Active   fenofibrate (TRICOR) 145 MG tablet [id number] Yes TAKE ONE TABLET ONCE DAILY [name], MD Taking Active   Ferrous Fumarate-Folic Acid (HEMOCYTE-F) 324-1 MG TABS  [id number] No 1 tablet po daily  Patient not taking: Reported on 07/10/2022   [name], MD Not Taking Active            Med Note Antony Contras, [place] B   Wed Jul 10, 2022  2:13 PM) Stopped because caused bloating and constipation  fluticasone (FLONASE) 50 MCG/ACT nasal spray [id number] Yes Place 2 sprays into both nostrils daily. [name], MD Taking Active   furosemide (LASIX) 20 MG tablet [id number] Yes TAKE ONE TABLET ONCE DAILY [name], MD Taking Active   glucose blood (ACCU-CHEK AVIVA) test strip [id number] Yes Twice daily (please use brand that patient insurance covers) [name], MD Taking Active   insulin degludec (TRESIBA FLEXTOUCH) 200 UNIT/ML FlexTouch Pen [id number] Yes Inject 12-20 Units into the skin daily.  Patient taking differently: Inject 12 Units into the skin daily.  Philemon Kingdom, MD Taking Active   Insulin Pen Needle 32G X 4 MM MISC [id number] Yes Use 1x a day Philemon Kingdom, MD Taking Active   levothyroxine (SYNTHROID) 200 MCG tablet [id number] Yes Take 1 tablet (200 mcg total) by mouth daily. Debbrah Alar, NP Taking Active            Med Note Tyler Pita, [place] I   Tue Apr 30, 2022  1:25 PM) Dose is 250 mcg. Take with 50 mcg pill.  levothyroxine (SYNTHROID) 50 MCG tablet [id number] Yes TAKE ONE TABLET ONCE DAILY [name], MD Taking Active   loratadine (CLARITIN) 10 MG tablet [id number] Yes Take 1 tablet (10 mg total) by mouth daily. [name], MD Taking Active   losartan (COZAAR) 100 MG tablet [id number] Yes TAKE ONE TABLET DAILY Debbrah Alar, NP Taking Active   metFORMIN (GLUCOPHAGE-XR) 500 MG 24 hr tablet [id number] Yes Take 4 tablets (2,000 mg total) by mouth daily with supper. Philemon Kingdom, MD Taking Active   montelukast (SINGULAIR) 10 MG tablet [id number] Yes TAKE ONE TABLET AT BEDTIME [name], MD Taking Active   rosuvastatin (CRESTOR) 40 MG tablet [id number] Yes TAKE ONE TABLET DAILY [name], MD Taking Active    Semaglutide, 1 MG/DOSE, (OZEMPIC, 1 MG/DOSE,) 4 MG/3ML SOPN [id number] Yes Inject 1 mg into the skin once a week. Philemon Kingdom, MD Taking Active            Med Note [facility], [place] B   Fri Nov 30, 2021  4:15 PM) Approved to received from [employer] 11/30/21 thru 05/23/2022  Vitamin D, Ergocalciferol, (DRISDOL) 1.25 MG (50000 UNIT) CAPS capsule [id number] Yes Take 1 capsule (50,000 Units total) by mouth every 7 (seven) days. [name], MD Taking Active             Patient Active Problem List   Diagnosis Date Noted   Asthma 06/11/2022   IDA (iron deficiency anemia) 04/30/2022   Lower GI bleed 04/29/2022   Allergic rhinitis 11/09/2021   Hx of papillary thyroid carcinoma 07/19/2021   Poorly controlled type 2 diabetes mellitus with circulatory disorder ([place]) 07/19/2021   Atypical chest pain 07/16/2021   Urinary incontinence 07/16/2021   Hypothyroidism 03/20/2021   Nausea 12/06/2020   Muscle cramps 12/06/2020   H/O thyroidectomy 08/30/2020   CSF leak from nose 01/13/2020   Uncontrolled type 2 diabetes mellitus with hyperglycemia ([place]) 04/14/2018   Vitamin D deficiency 03/30/2018   Dental infection 03/29/2018   Dysphagia 12/28/2017   Cough 12/28/2017   Peripheral neuropathy 09/22/2017   Lipoma 09/22/2017   Lymphedema 05/22/2017   Asthmatic bronchitis, mild intermittent, uncomplicated 14/48/1856   Breast cancer ([place]) 02/17/2017   Dysuria 02/17/2017   Insomnia 10/08/2016   Dilated aortic root ([place])    Port catheter in place 08/01/2016   Malignant neoplasm of upper-outer quadrant of left female breast ([place]) 05/14/2016   PVC's (premature ventricular contractions) 05/02/2016   Neck mass 04/21/2016   LVH (left ventricular hypertrophy) due to hypertensive disease, with heart failure ([place]) 04/18/2016   Tonsillar hypertrophy 07/07/2015   Back pain 10/02/2014   Obesity 07/11/2014   S/P right TKA 05/10/2014   S/P knee replacement 05/10/2014   Atrial tachycardia, paroxysmal  02/22/2014   Type 2 diabetes mellitus with hyperglycemia ([facility]) 12/31/2013   Chronic diastolic CHF (congestive heart failure) ([place]) 11/22/2013   CAD (coronary artery disease), native coronary artery 11/22/2013   Dyspnea on exertion 11/03/2013   Undiagnosed cardiac murmurs 09/05/2013   Preventative  health care 09/05/2013   Musculoskeletal pain 11/10/2012   Vasomotor rhinitis 04/05/2012   Obstructive sleep apnea 04/05/2012   Anemia 04/05/2012   Elevated LFTs 04/05/2012   OA (osteoarthritis) of knee 04/05/2012   Hernia 10/13/2010   Hyperlipidemia 09/11/2007   Anxiety and depression 09/11/2007   Essential hypertension 09/11/2007   GERD 09/11/2007   RENAL CALCULUS 09/11/2007   RENAL CYST 09/11/2007   COLONIC POLYPS, HX OF 09/11/2007     Medication Assistance:   [name] and Ozempic obtained through [employer] medication assistance program.  Enrollment ends 05/2022; Patient has received mailing of [employer] patient assistance program application but has not completed yet. She also previously received.  Symbicort from [facility] and Me Program - patient states she has 4 inhalers and she is not using every day. Will not reapply for [place] and Me program since they will not have Symbicort available in 2024. Plan to switch patient to using generic Symbicort.    Assessment / Plan: Type 2 DM - controlled.  Continue Tresiba 12 units daily, Ozempic '1mg'$  weekly and metformin ER '500mg'$  4 tabs daily.  Sent in updated Rx for lancets. Patient to restart checking blood glucose up to 2 times a day Reviewed blood glucose goals  Anxiety - improved  Continue escitalopram daily.  Continue buspirone 7.'5mg'$  2 to 3 times a day  Hypertension:  Continue diltiazem '120mg'$  daily, furosemide '20mg'$  daily, losartan '100mg'$  daily and carvedilol '25mg'$  twice a day.  Continue to check blood pressure at home 3 to 4 days per week.  Reviewed blood pressure goal < 130/80 due to type 2 DM   Hyperlipidemia:  Continue rosuvastatin '40mg'$   daily with ezetimibe '10mg'$  daily.  Continue plant based diet  Due to recheck lipids at appt with PCP in Feb 2024 (I suspect Tg will be improved with diet changes)  Medication Management - reviewed med list and refill history.  Ok to remain office ferrous fumarate due to GI intolerance.  Updated Rx for levothyroxine 282mg    Follow Up:  Telephone follow up appointment with care management team member scheduled for:  1 to 2 months   TCherre Robins PharmD Clinical Pharmacist [phone]

## 2022-07-10 NOTE — Patient Instructions (Signed)
[name] It was a pleasure speaking with you today.  Below is a summary of your health goals and summary of our recent visit.   Diabetes - A1c goal < 7.0% Continue Tresiba 12 units daily, Ozempic '1mg'$  weekly and metformin ER '500mg'$  4 tabs daily.  Sent in updated prescription for lancets. Restart checking blood glucose up to 2 times a day Reviewed blood glucose goals Fasting blood glucose goal (before meals) = 80 to 130 Blood glucose goal after a meal = less than 180    Anxiety  Continue escitalopram daily.  Continue buspirone 7.'5mg'$  2 to 3 times a day  High Blood Pressure  Continue diltiazem '120mg'$  daily, furosemide '20mg'$  daily, losartan '100mg'$  daily and carvedilol '25mg'$  twice a day.  Continue to check blood pressure at home 3 to 4 days per week.  Reviewed blood pressure goal < 130/80 due to diabetes  Elevated cholesterol  Continue rosuvastatin '40mg'$  daily with ezetimibe '10mg'$  daily.  Continue plant based diet  Due to recheck lipids n Feb 2024   Medication Management - reviewed med list and refill history.  Ok to remain office ferrous fumarate due to bloating and constipation. Follow up with Carola Rhine, NP in hematology clinic as planned.    Updated prescription for levothyroxine 224mg was sent to your pharmacy.   As always if you have any questions or concerns especially regarding medications, please feel free to contact me either at the phone number below or with a MyChart message.   Keep up the good work!  TCherre Robins PharmD Clinical Pharmacist [facility] [phone](direct line)  [phone](main office number)   Patient verbalizes understanding of instructions and care plan provided today and agrees to view in [facility] MyChart status and patient understanding of how to access instructions and care plan via MyChart confirmed with patient.

## 2022-07-11 ENCOUNTER — Ambulatory Visit: Payer: Medicare Other | Admitting: *Deleted

## 2022-07-11 ENCOUNTER — Encounter: Payer: Self-pay | Admitting: *Deleted

## 2022-07-11 DIAGNOSIS — R2681 Unsteadiness on feet: Secondary | ICD-10-CM

## 2022-07-11 DIAGNOSIS — M6281 Muscle weakness (generalized): Secondary | ICD-10-CM | POA: Diagnosis not present

## 2022-07-11 NOTE — Therapy (Signed)
OUTPATIENT PHYSICAL THERAPY LOWER EXTREMITY TREATMENT   Patient Name: [name] MRN: [id number] DOB:[birth year], 72 y.o., female Today's Date: 07/11/2022  END OF SESSION:  PT End of Session - 07/11/22 0949     Visit Number 9    Number of Visits 12    Date for PT Re-Evaluation 08/16/22    PT Start Time 0945    PT Stop Time 3614    PT Time Calculation (min) 47 min            Past Medical History:  Diagnosis Date   Anemia 04/05/2012   Anxiety    Anxiety state 09/11/2007   Qualifier: Diagnosis of  By: Sherren Mocha RN, Dorian Pod     Asthma    Atrial tachycardia, paroxysmal    Back pain 10/02/2014   Breast cancer ([place]) 02/17/2017   CAD (coronary artery disease), native coronary artery    remote Cath with nonobstructive ASCAD with 20% mid LAD, 20% prox left circ and 20% ostial RCA   Chicken pox as a child   Chronic diastolic CHF (congestive heart failure), NYHA class 1 ([place])    Complication of anesthesia    pt states feels different in her body after anesthesia when waking up and also experiences a smell of burnt plastic for approx a wk    Constipation    Depression    Diabetes ([place])    Dilated aortic root ([place])    58m by echo 08/2020   Dysuria 02/17/2017   Elevated LFTs 04/05/2012   GERD (gastroesophageal reflux disease)    Goiter    Heart murmur    hx of one at birth    History of kidney stones    History of radiation therapy 10/31/16-12/17/16   left breast 45 Gy in 25 fractions, left breast boost 16 Gy in 8 fractions   Hx of colonic polyps    Hyperlipidemia    Hypertension    Insomnia 10/08/2016   Joint pain    Malignant neoplasm of upper-outer quadrant of left female breast ([facility] 05/14/2016   not with patient   Measles as a child   Mumps as a child   OA (osteoarthritis) of knee 04/05/2012   Obesity 07/11/2014   PONV (postoperative nausea and vomiting)    Preventative health care 09/05/2013   PVC's (premature ventricular contractions) 05/02/2016   Shortness of breath  dyspnea    walking distances / climbing stairs   Sleep apnea 04/05/2012   Tinea corporis 02/23/2013   Type 2 diabetes mellitus with hyperglycemia ([place] 12/31/2013   Vasomotor rhinitis 04/05/2012   Ventral hernia    Wheezing    Past Surgical History:  Procedure Laterality Date   BREAST LUMPECTOMY Left 2017   BREAST LUMPECTOMY WITH RADIOACTIVE SEED AND SENTINEL LYMPH NODE BIOPSY Left 06/21/2016   Procedure: LEFT BREAST LUMPECTOMY WITH RADIOACTIVE SEED AND SENTINEL LYMPH NODE BIOPSY, INJECT BLUE DYE LEFT BREAST;  Surgeon: HFanny Skates MD;  [facility];  Service: General;  Laterality: Left;   CARDIAC CATHETERIZATION     normal coroary arteries per patient   CARDIOVASCULAR STRESS TEST     10/12/2013   CESAREAN SECTION     X 3   CHOLECYSTECTOMY     COLONOSCOPY WITH PROPOFOL N/A 03/28/2015   Procedure: COLONOSCOPY WITH PROPOFOL;  Surgeon: JJuanita Craver MD;  [facility]: [facility];  Service: Endoscopy;  Laterality: N/A;   HERNIA REPAIR  02/08/2011   ventral hernia   JOINT REPLACEMENT     bilateral   KNEE  ARTHROSCOPY  05/2010   bilateral   LEFT AND RIGHT HEART CATHETERIZATION WITH CORONARY ANGIOGRAM N/A 11/08/2013   Procedure: LEFT AND RIGHT HEART CATHETERIZATION WITH CORONARY ANGIOGRAM;  Surgeon: Dhani Imel D McAlhany, MD;  [facility]: [facility];  Service: Cardiovascular;  Laterality: N/A;   MENISCUS REPAIR  2009   MOUTH SURGERY     teeth implants   PORT-A-CATH REMOVAL N/A 01/24/2017   Procedure: REMOVAL PORT-A-CATH;  Surgeon: Ingram, Haywood, MD;  [facility]: [facility];  Service: General;  Laterality: N/A;   PORTACATH PLACEMENT Right 07/16/2016   Procedure: INSERTION PORT-A-CATH RIGHT INTERNAL JUGULAR WITH ULTRASOUND;  Surgeon: Haywood Ingram, MD;  [facility];  Service: General;  Laterality: Right;   REPAIR DURAL / CSF LEAK  2021   performed at [facility] in [place]   TOTAL KNEE ARTHROPLASTY  2011   left   TOTAL KNEE ARTHROPLASTY Right 05/10/2014   Procedure: RIGHT  TOTAL KNEE ARTHROPLASTY;  Surgeon: Matthew D Olin, MD;  [facility];  Service: Orthopedics;  Laterality: Right;   TOTAL THYROIDECTOMY Bilateral 2022   TUBAL LIGATION     WISDOM TOOTH EXTRACTION  2000   Patient Active Problem List   Diagnosis Date Noted   Asthma 06/11/2022   IDA (iron deficiency anemia) 04/30/2022   Lower GI bleed 04/29/2022   Allergic rhinitis 11/09/2021   Hx of papillary thyroid carcinoma 07/19/2021   Poorly controlled type 2 diabetes mellitus with circulatory disorder (HCC) 07/19/2021   Atypical chest pain 07/16/2021   Urinary incontinence 07/16/2021   Hypothyroidism 03/20/2021   Nausea 12/06/2020   Muscle cramps 12/06/2020   H/O thyroidectomy 08/30/2020   CSF leak from nose 01/13/2020   Uncontrolled type 2 diabetes mellitus with hyperglycemia (HCC) 04/14/2018   Vitamin D deficiency 03/30/2018   Dental infection 03/29/2018   Dysphagia 12/28/2017   Cough 12/28/2017   Peripheral neuropathy 09/22/2017   Lipoma 09/22/2017   Lymphedema 05/22/2017   Asthmatic bronchitis, mild intermittent, uncomplicated 04/22/2017   Breast cancer (HCC) 02/17/2017   Dysuria 02/17/2017   Insomnia 10/08/2016   Dilated aortic root (HCC)    Port catheter in place 08/01/2016   Malignant neoplasm of upper-outer quadrant of left female breast (HCC) 05/14/2016   PVC's (premature ventricular contractions) 05/02/2016   Neck mass 04/21/2016   LVH (left ventricular hypertrophy) due to hypertensive disease, with heart failure (HCC) 04/18/2016   Tonsillar hypertrophy 07/07/2015   Back pain 10/02/2014   Obesity 07/11/2014   S/P right TKA 05/10/2014   S/P knee replacement 05/10/2014   Atrial tachycardia, paroxysmal 02/22/2014   Type 2 diabetes mellitus with hyperglycemia (HCC) 12/31/2013   Chronic diastolic CHF (congestive heart failure) (HCC) 11/22/2013   CAD (coronary artery disease), native coronary artery 11/22/2013   Dyspnea on exertion 11/03/2013   Undiagnosed cardiac murmurs  09/05/2013   Preventative health care 09/05/2013   Musculoskeletal pain 11/10/2012   Vasomotor rhinitis 04/05/2012   Obstructive sleep apnea 04/05/2012   Anemia 04/05/2012   Elevated LFTs 04/05/2012   OA (osteoarthritis) of knee 04/05/2012   Hernia 10/13/2010   Hyperlipidemia 09/11/2007   Anxiety and depression 09/11/2007   Essential hypertension 09/11/2007   GERD 09/11/2007   RENAL CALCULUS 09/11/2007   RENAL CYST 09/11/2007   COLONIC POLYPS, HX OF 09/11/2007   REFERRING PROVIDER: Blyth, Stacey A, MD   REFERRING DIAG: Weakness of both lower extremities   THERAPY DIAG:  Muscle weakness (generalized)  Unsteadiness on feet  Rationale for Evaluation and Treatment: Rehabilitation  ONSET DATE: October 2023    SUBJECTIVE:   SUBJECTIVE STATEMENT:  No new complaints. Feeling better overall. Balance is getting better.  PERTINENT HISTORY: HTN, CHF, CAD, DM, history of breast cancer, OA, anxiety, depression, lymphedema, and history of bilateral TKA's   PAIN:  Are you having pain? Yes: NPRS scale: no score provided/10 Pain location: left posterior hip  Pain description: sharp Aggravating factors: rolling over in bed, transfers Relieving factors: heat  PRECAUTIONS: Fall  NEXT MD VISIT: 06/11/22  OBJECTIVE:   LOWER EXTREMITY MMT:  MMT Right eval Left eval  Hip flexion 3+/5 4-/5  Hip extension    Hip abduction    Hip adduction    Hip internal rotation    Hip external rotation    Knee flexion 4/5 4/5  Knee extension 4-/5 4-/5  Ankle dorsiflexion 4-/5 4-/5  Ankle plantarflexion    Ankle inversion    Ankle eversion     (Blank rows = not tested)  TODAY'S TREATMENT:                                                                                                                              DATE:                                    07/11/12 EXERCISE LOG  Exercise Repetitions and Resistance Comments  Nustep  L4 x 18 minutes   Rockerboard Forward/Back 5 minutes    Rockerboard side to side    Tandem stance X 3  each LE forward x 1 min   Knee ext 10# x 3 minutes   Ham curls 30# x 3 minutes   Blue XTS walking Forward x10 and backward x10   Inverted BOSU    Sit to stand         Blank cell = exercise not performed today   PATIENT EDUCATION:  Education details: POC, prognosis, strengthening, and goals for therapy  Person educated: Patient Education method: Explanation Education comprehension: verbalized understanding  HOME EXERCISE PROGRAM:  ASSESSMENT:  CLINICAL IMPRESSION: Pt arrived today doing fairly well and reports notable improvement in balance. Rx focused on LE strengthening as well as balance activities. She continues to progress towards goals, but unable to meet STG for stairs due to weakness.      OBJECTIVE IMPAIRMENTS: Abnormal gait, decreased activity tolerance, decreased balance, decreased mobility, difficulty walking, decreased strength, postural dysfunction, and pain.   ACTIVITY LIMITATIONS: carrying, lifting, standing, squatting, stairs, transfers, and locomotion level  PARTICIPATION LIMITATIONS: meal prep, cleaning, shopping, and community activity  PERSONAL FACTORS: Time since onset of injury/illness/exacerbation and 3+ comorbidities: HTN, CHF, CAD, DM, history of breast cancer, OA, anxiety, depression, lymphedema, and history of bilateral TKA's   are also affecting patient's functional outcome.   REHAB POTENTIAL: Good  CLINICAL DECISION MAKING: Evolving/moderate complexity  EVALUATION COMPLEXITY: Moderate  GOALS: Goals reviewed with patient? Yes  SHORT TERM GOALS: Target date:  07/02/22 Patient will be independent with her initial HEP. Baseline: Goal status: Met  2.  Patient will improve her tandem stance to at least 15 seconds bilaterally for improved lower extremity stability. Baseline:  Goal status: MET  3.  Patient will be able to navigate at least 4 steps with a reciprocal pattern for improved household  mobility. Baseline:  Goal status: Partially met  LONG TERM GOALS: Target date: 07/23/22  Patient will be independent with her advanced HEP. Baseline:  Goal status: Partially met  2.  Patient will be able to walk for at least 15 minutes without being limited by fatigue. Baseline: Can only walk about 5  minutes currently. Goal status: Partially met  3.  Patient will be able to navigate at least 8 steps with a reciprocal pattern for improved household mobility. Baseline: be able to navigate at least 8 step; currently using a step to pattern Goal status: On- going  4.  Patient will improve her 5 times sit to stand time to 12 seconds or less for improved lower extremity power. Baseline:  Goal status: On-going  PLAN:  PT FREQUENCY: 1-2x/week  PT DURATION: 6 weeks  PLANNED INTERVENTIONS: Therapeutic exercises, Therapeutic activity, Neuromuscular re-education, Balance training, Gait training, Patient/Family education, Self Care, Stair training, and Re-evaluation  PLAN FOR NEXT SESSION: NuStep, lower extremity strengthening, and balance interventions  Tinleigh Whitmire,CHRIS, PTA 07/11/2022, 10:41 AM

## 2022-07-16 ENCOUNTER — Encounter: Payer: Self-pay | Admitting: *Deleted

## 2022-07-16 ENCOUNTER — Ambulatory Visit: Payer: Medicare Other | Admitting: *Deleted

## 2022-07-16 DIAGNOSIS — R2681 Unsteadiness on feet: Secondary | ICD-10-CM

## 2022-07-16 DIAGNOSIS — M6281 Muscle weakness (generalized): Secondary | ICD-10-CM | POA: Diagnosis not present

## 2022-07-16 NOTE — Therapy (Signed)
OUTPATIENT PHYSICAL THERAPY LOWER EXTREMITY TREATMENT   Patient Name: [name] MRN: [id number] DOB:[birth year], 72 y.o., female Today's Date: 07/16/2022  END OF SESSION:  PT End of Session - 07/16/22 1008     Visit Number 10    Number of Visits 12    Date for PT Re-Evaluation 08/16/22    PT Start Time 0945    PT Stop Time 8786    PT Time Calculation (min) 50 min            Past Medical History:  Diagnosis Date   Anemia 04/05/2012   Anxiety    Anxiety state 09/11/2007   Qualifier: Diagnosis of  By: Sherren Mocha RN, Dorian Pod     Asthma    Atrial tachycardia, paroxysmal    Back pain 10/02/2014   Breast cancer ([place]) 02/17/2017   CAD (coronary artery disease), native coronary artery    remote Cath with nonobstructive ASCAD with 20% mid LAD, 20% prox left circ and 20% ostial RCA   Chicken pox as a child   Chronic diastolic CHF (congestive heart failure), NYHA class 1 ([place])    Complication of anesthesia    pt states feels different in her body after anesthesia when waking up and also experiences a smell of burnt plastic for approx a wk    Constipation    Depression    Diabetes ([place])    Dilated aortic root ([place])    14m by echo 08/2020   Dysuria 02/17/2017   Elevated LFTs 04/05/2012   GERD (gastroesophageal reflux disease)    Goiter    Heart murmur    hx of one at birth    History of kidney stones    History of radiation therapy 10/31/16-12/17/16   left breast 45 Gy in 25 fractions, left breast boost 16 Gy in 8 fractions   Hx of colonic polyps    Hyperlipidemia    Hypertension    Insomnia 10/08/2016   Joint pain    Malignant neoplasm of upper-outer quadrant of left female breast ([facility] 05/14/2016   not with patient   Measles as a child   Mumps as a child   OA (osteoarthritis) of knee 04/05/2012   Obesity 07/11/2014   PONV (postoperative nausea and vomiting)    Preventative health care 09/05/2013   PVC's (premature ventricular contractions) 05/02/2016   Shortness of  breath dyspnea    walking distances / climbing stairs   Sleep apnea 04/05/2012   Tinea corporis 02/23/2013   Type 2 diabetes mellitus with hyperglycemia ([place] 12/31/2013   Vasomotor rhinitis 04/05/2012   Ventral hernia    Wheezing    Past Surgical History:  Procedure Laterality Date   BREAST LUMPECTOMY Left 2017   BREAST LUMPECTOMY WITH RADIOACTIVE SEED AND SENTINEL LYMPH NODE BIOPSY Left 06/21/2016   Procedure: LEFT BREAST LUMPECTOMY WITH RADIOACTIVE SEED AND SENTINEL LYMPH NODE BIOPSY, INJECT BLUE DYE LEFT BREAST;  Surgeon: HFanny Skates MD;  [facility];  Service: General;  Laterality: Left;   CARDIAC CATHETERIZATION     normal coroary arteries per patient   CARDIOVASCULAR STRESS TEST     10/12/2013   CESAREAN SECTION     X 3   CHOLECYSTECTOMY     COLONOSCOPY WITH PROPOFOL N/A 03/28/2015   Procedure: COLONOSCOPY WITH PROPOFOL;  Surgeon: JJuanita Craver MD;  [facility];  Service: Endoscopy;  Laterality: N/A;   HERNIA REPAIR  02/08/2011   ventral hernia   JOINT REPLACEMENT     bilateral   KNEE  ARTHROSCOPY  05/2010   bilateral   LEFT AND RIGHT HEART CATHETERIZATION WITH CORONARY ANGIOGRAM N/A 11/08/2013   Procedure: LEFT AND RIGHT HEART CATHETERIZATION WITH CORONARY ANGIOGRAM;  Surgeon: [name], MD;  Location: [facility] C[facility] LAB;  Service: Cardiovascular;  Laterality: N/A;   MENISCUS REPAIR  2009   MOUTH SURGERY     teeth implants   PORT-A-CATH REMOVAL N/A 01/24/2017   Procedure: REMOVAL PORT-A-CATH;  Surgeon: Fanny Skates, MD;  Location: [place];  Service: General;  Laterality: N/A;   PORTACATH PLACEMENT Right 07/16/2016   Procedure: INSERTION PORT-A-CATH RIGHT INTERNAL JUGULAR WITH ULTRASOUND;  Surgeon: Fanny Skates, MD;  Location: [place];  Service: General;  Laterality: Right;   REPAIR DURAL / CSF LEAK  2021   performed at [facility] in [place]  2011   left   TOTAL KNEE ARTHROPLASTY Right 05/10/2014   Procedure:  RIGHT TOTAL KNEE ARTHROPLASTY;  Surgeon: Mauri Pole, MD;  [facility];  Service: Orthopedics;  Laterality: Right;   TOTAL THYROIDECTOMY Bilateral 2022   TUBAL LIGATION     WISDOM TOOTH EXTRACTION  2000   Patient Active Problem List   Diagnosis Date Noted   Asthma 06/11/2022   IDA (iron deficiency anemia) 04/30/2022   Lower GI bleed 04/29/2022   Allergic rhinitis 11/09/2021   Hx of papillary thyroid carcinoma 07/19/2021   Poorly controlled type 2 diabetes mellitus with circulatory disorder ([place]) 07/19/2021   Atypical chest pain 07/16/2021   Urinary incontinence 07/16/2021   Hypothyroidism 03/20/2021   Nausea 12/06/2020   Muscle cramps 12/06/2020   H/O thyroidectomy 08/30/2020   CSF leak from nose 01/13/2020   Uncontrolled type 2 diabetes mellitus with hyperglycemia ([place]) 04/14/2018   Vitamin D deficiency 03/30/2018   Dental infection 03/29/2018   Dysphagia 12/28/2017   Cough 12/28/2017   Peripheral neuropathy 09/22/2017   Lipoma 09/22/2017   Lymphedema 05/22/2017   Asthmatic bronchitis, mild intermittent, uncomplicated 26/33/3545   Breast cancer ([place]) 02/17/2017   Dysuria 02/17/2017   Insomnia 10/08/2016   Dilated aortic root ([place])    Port catheter in place 08/01/2016   Malignant neoplasm of upper-outer quadrant of left female breast ([place]) 05/14/2016   PVC's (premature ventricular contractions) 05/02/2016   Neck mass 04/21/2016   LVH (left ventricular hypertrophy) due to hypertensive disease, with heart failure ([place]) 04/18/2016   Tonsillar hypertrophy 07/07/2015   Back pain 10/02/2014   Obesity 07/11/2014   S/P right TKA 05/10/2014   S/P knee replacement 05/10/2014   Atrial tachycardia, paroxysmal 02/22/2014   Type 2 diabetes mellitus with hyperglycemia ([place]) 12/31/2013   Chronic diastolic CHF (congestive heart failure) ([place]) 11/22/2013   CAD (coronary artery disease), native coronary artery 11/22/2013   Dyspnea on exertion 11/03/2013   Undiagnosed cardiac  murmurs 09/05/2013   Preventative health care 09/05/2013   Musculoskeletal pain 11/10/2012   Vasomotor rhinitis 04/05/2012   Obstructive sleep apnea 04/05/2012   Anemia 04/05/2012   Elevated LFTs 04/05/2012   OA (osteoarthritis) of knee 04/05/2012   Hernia 10/13/2010   Hyperlipidemia 09/11/2007   Anxiety and depression 09/11/2007   Essential hypertension 09/11/2007   GERD 09/11/2007   RENAL CALCULUS 09/11/2007   RENAL CYST 09/11/2007   COLONIC POLYPS, HX OF 09/11/2007   REFERRING PROVIDER: [name], MD   REFERRING DIAG: Weakness of both lower extremities   THERAPY DIAG:  Muscle weakness (generalized)  Unsteadiness on feet  Rationale for Evaluation and Treatment: Rehabilitation  ONSET DATE: October 2023  SUBJECTIVE:   SUBJECTIVE STATEMENT:  No new complaints. Feeling better overall. Balance is getting better.  PERTINENT HISTORY: HTN, CHF, CAD, DM, history of breast cancer, OA, anxiety, depression, lymphedema, and history of bilateral TKA's   PAIN:  Are you having pain? Yes: NPRS scale: no score provided/10 Pain location: left posterior hip  Pain description: sharp Aggravating factors: rolling over in bed, transfers Relieving factors: heat  PRECAUTIONS: Fall  NEXT MD VISIT: 06/11/22  OBJECTIVE:   LOWER EXTREMITY MMT:  MMT Right eval Left eval  Hip flexion 3+/5 4-/5  Hip extension    Hip abduction    Hip adduction    Hip internal rotation    Hip external rotation    Knee flexion 4/5 4/5  Knee extension 4-/5 4-/5  Ankle dorsiflexion 4-/5 4-/5  Ankle plantarflexion    Ankle inversion    Ankle eversion     (Blank rows = not tested)  TODAY'S TREATMENT:                                                                                                                              DATE:                                    07/16/22 EXERCISE LOG  Exercise Repetitions and Resistance Comments  Nustep  L4 x 18 minutes   Rockerboard Forward/Back balance 5  minutes   Rockerboard side to side    Tandem stance X 5  each LE forward    Knee ext 10# x 3 minutes   Ham curls 30# x 3 minutes   Blue XTS walking Forward x10 and backward x10   Inverted BOSU    Sit to stand         Blank cell = exercise not performed today   PATIENT EDUCATION:  Education details: POC, prognosis, strengthening, and goals for therapy  Person educated: Patient Education method: Explanation Education comprehension: verbalized understanding  HOME EXERCISE PROGRAM:  ASSESSMENT:  CLINICAL IMPRESSION: Pt arrived today doing fairly well and reports notable improvement in balance with side to side. Rx focused on LE strengthening as well as balance activities again. Pt did great again without complaints.  Pt has 2 visits left and then DC.     OBJECTIVE IMPAIRMENTS: Abnormal gait, decreased activity tolerance, decreased balance, decreased mobility, difficulty walking, decreased strength, postural dysfunction, and pain.   ACTIVITY LIMITATIONS: carrying, lifting, standing, squatting, stairs, transfers, and locomotion level  PARTICIPATION LIMITATIONS: meal prep, cleaning, shopping, and community activity  PERSONAL FACTORS: Time since onset of injury/illness/exacerbation and 3+ comorbidities: HTN, CHF, CAD, DM, history of breast cancer, OA, anxiety, depression, lymphedema, and history of bilateral TKA's   are also affecting patient's functional outcome.   REHAB POTENTIAL: Good  CLINICAL DECISION MAKING: Evolving/moderate complexity  EVALUATION COMPLEXITY: Moderate  GOALS: Goals reviewed with patient? Yes  SHORT TERM GOALS:  Target date: 07/02/22 Patient will be independent with her initial HEP. Baseline: Goal status: Met  2.  Patient will improve her tandem stance to at least 15 seconds bilaterally for improved lower extremity stability. Baseline:  Goal status: MET  3.  Patient will be able to navigate at least 4 steps with a reciprocal pattern for improved  household mobility. Baseline:  Goal status: Partially met  LONG TERM GOALS: Target date: 07/23/22  Patient will be independent with her advanced HEP. Baseline:  Goal status: Partially met  2.  Patient will be able to walk for at least 15 minutes without being limited by fatigue. Baseline: Can only walk about 5  minutes currently. Goal status: Partially met  3.  Patient will be able to navigate at least 8 steps with a reciprocal pattern for improved household mobility. Baseline: be able to navigate at least 8 step; currently using a step to pattern Goal status: On- going  4.  Patient will improve her 5 times sit to stand time to 12 seconds or less for improved lower extremity power. Baseline:  Goal status: On-going  PLAN:  PT FREQUENCY: 1-2x/week  PT DURATION: 6 weeks  PLANNED INTERVENTIONS: Therapeutic exercises, Therapeutic activity, Neuromuscular re-education, Balance training, Gait training, Patient/Family education, Self Care, Stair training, and Re-evaluation  PLAN FOR NEXT SESSION: NuStep, lower extremity strengthening, and balance interventions  Vanesha Athens,CHRIS, PTA 07/16/2022, 6:10 PM

## 2022-07-17 ENCOUNTER — Ambulatory Visit: Payer: Medicare Other

## 2022-07-17 DIAGNOSIS — G4733 Obstructive sleep apnea (adult) (pediatric): Secondary | ICD-10-CM

## 2022-07-18 ENCOUNTER — Encounter: Payer: Medicare Other | Admitting: *Deleted

## 2022-07-19 ENCOUNTER — Encounter: Payer: Self-pay | Admitting: Physical Therapy

## 2022-07-19 ENCOUNTER — Ambulatory Visit: Payer: Medicare Other | Admitting: Physical Therapy

## 2022-07-19 DIAGNOSIS — M6281 Muscle weakness (generalized): Secondary | ICD-10-CM

## 2022-07-19 DIAGNOSIS — R2681 Unsteadiness on feet: Secondary | ICD-10-CM

## 2022-07-19 DIAGNOSIS — G4733 Obstructive sleep apnea (adult) (pediatric): Secondary | ICD-10-CM

## 2022-07-19 NOTE — Therapy (Signed)
OUTPATIENT PHYSICAL THERAPY LOWER EXTREMITY TREATMENT   Patient Name: [name] MRN: [id number] DOB:1950/11/25, 72 y.o., female Today's Date: 07/19/2022  END OF SESSION:  PT End of Session - 07/19/22 0831     Visit Number 11    Number of Visits 12    Date for PT Re-Evaluation 08/16/22    PT Start Time 0826    PT Stop Time 0901   late arrival   PT Time Calculation (min) 35 min    Activity Tolerance Patient tolerated treatment well    Behavior During Therapy [facility] for tasks assessed/performed            Past Medical History:  Diagnosis Date   Anemia 04/05/2012   Anxiety    Anxiety state 09/11/2007   Qualifier: Diagnosis of  By: Sherren Mocha RN, Dorian Pod     Asthma    Atrial tachycardia, paroxysmal    Back pain 10/02/2014   Breast cancer ([place]) 02/17/2017   CAD (coronary artery disease), native coronary artery    remote Cath with nonobstructive ASCAD with 20% mid LAD, 20% prox left circ and 20% ostial RCA   Chicken pox as a child   Chronic diastolic CHF (congestive heart failure), NYHA class 1 ([place])    Complication of anesthesia    pt states feels different in her body after anesthesia when waking up and also experiences a smell of burnt plastic for approx a wk    Constipation    Depression    Diabetes ([place])    Dilated aortic root ([place])    62m by echo 08/2020   Dysuria 02/17/2017   Elevated LFTs 04/05/2012   GERD (gastroesophageal reflux disease)    Goiter    Heart murmur    hx of one at birth    History of kidney stones    History of radiation therapy 10/31/16-12/17/16   left breast 45 Gy in 25 fractions, left breast boost 16 Gy in 8 fractions   Hx of colonic polyps    Hyperlipidemia    Hypertension    Insomnia 10/08/2016   Joint pain    Malignant neoplasm of upper-outer quadrant of left female breast ([place] 05/14/2016   not with patient   Measles as a child   Mumps as a child   OA (osteoarthritis) of knee 04/05/2012   Obesity 07/11/2014   PONV (postoperative nausea  and vomiting)    Preventative health care 09/05/2013   PVC's (premature ventricular contractions) 05/02/2016   Shortness of breath dyspnea    walking distances / climbing stairs   Sleep apnea 04/05/2012   Tinea corporis 02/23/2013   Type 2 diabetes mellitus with hyperglycemia ([place] 12/31/2013   Vasomotor rhinitis 04/05/2012   Ventral hernia    Wheezing    Past Surgical History:  Procedure Laterality Date   BREAST LUMPECTOMY Left 2017   BREAST LUMPECTOMY WITH RADIOACTIVE SEED AND SENTINEL LYMPH NODE BIOPSY Left 06/21/2016   Procedure: LEFT BREAST LUMPECTOMY WITH RADIOACTIVE SEED AND SENTINEL LYMPH NODE BIOPSY, INJECT BLUE DYE LEFT BREAST;  Surgeon: HFanny Skates MD;  Location: [facility] OR;  Service: General;  Laterality: Left;   CARDIAC CATHETERIZATION     normal coroary arteries per patient   CARDIOVASCULAR STRESS TEST     10/12/2013   CESAREAN SECTION     X 3   CHOLECYSTECTOMY     COLONOSCOPY WITH PROPOFOL N/A 03/28/2015   Procedure: COLONOSCOPY WITH PROPOFOL;  Surgeon: JJuanita Craver MD;  Location: [facility] ENDOSCOPY;  Service: Endoscopy;  Laterality: N/A;  HERNIA REPAIR  02/08/2011   ventral hernia   JOINT REPLACEMENT     bilateral   KNEE ARTHROSCOPY  05/2010   bilateral   LEFT AND RIGHT HEART CATHETERIZATION WITH CORONARY ANGIOGRAM N/A 11/08/2013   Procedure: LEFT AND RIGHT HEART CATHETERIZATION WITH CORONARY ANGIOGRAM;  Surgeon: [name], MD;  Location[facility] CATH LAB;  Service: Cardiovascular;  Laterality: N/A;   MENISCUS REPAIR  2009   MOUTH SURGERY     teeth implants   PORT-A-CATH REMOVAL N/A 01/24/2017   Procedure: REMOVAL PORT-A-CATH;  Surgeon: Fanny Skates, MD;  Location: [facility];  Service: General;  Laterality: N/A;   PORTACATH PLACEMENT Right 07/16/2016   Procedure: INSERTION PORT-A-CATH RIGHT INTERNAL JUGULAR WITH ULTRASOUND;  Surgeon: Fanny Skates, MD;  Location: [facility];  Service: General;  Laterality: Right;   REPAIR DURAL / CSF LEAK  2021   performed at [facility] in [place]  2011   left   TOTAL KNEE ARTHROPLASTY Right 05/10/2014   Procedure: RIGHT TOTAL KNEE ARTHROPLASTY;  Surgeon: Mauri Pole, MD;  Location: [facility] ORS;  Service: Orthopedics;  Laterality: Right;   TOTAL THYROIDECTOMY Bilateral 2022   TUBAL LIGATION     WISDOM TOOTH EXTRACTION  2000   Patient Active Problem List   Diagnosis Date Noted   Asthma 06/11/2022   IDA (iron deficiency anemia) 04/30/2022   Lower GI bleed 04/29/2022   Allergic rhinitis 11/09/2021   Hx of papillary thyroid carcinoma 07/19/2021   Poorly controlled type 2 diabetes mellitus with circulatory disorder ([place]) 07/19/2021   Atypical chest pain 07/16/2021   Urinary incontinence 07/16/2021   Hypothyroidism 03/20/2021   Nausea 12/06/2020   Muscle cramps 12/06/2020   H/O thyroidectomy 08/30/2020   CSF leak from nose 01/13/2020   Uncontrolled type 2 diabetes mellitus with hyperglycemia ([place]) 04/14/2018   Vitamin D deficiency 03/30/2018   Dental infection 03/29/2018   Dysphagia 12/28/2017   Cough 12/28/2017   Peripheral neuropathy 09/22/2017   Lipoma 09/22/2017   Lymphedema 05/22/2017   Asthmatic bronchitis, mild intermittent, uncomplicated 83/38/2505   Breast cancer ([place]) 02/17/2017   Dysuria 02/17/2017   Insomnia 10/08/2016   Dilated aortic root ([place])    Port catheter in place 08/01/2016   Malignant neoplasm of upper-outer quadrant of left female breast ([place]) 05/14/2016   PVC's (premature ventricular contractions) 05/02/2016   Neck mass 04/21/2016   LVH (left ventricular hypertrophy) due to hypertensive disease, with heart failure ([place]) 04/18/2016   Tonsillar hypertrophy 07/07/2015   Back pain 10/02/2014   Obesity 07/11/2014   S/P right TKA 05/10/2014   S/P knee replacement 05/10/2014   Atrial tachycardia, paroxysmal 02/22/2014   Type 2 diabetes mellitus with hyperglycemia ([place]) 12/31/2013   Chronic diastolic CHF (congestive heart failure) ([place]) 11/22/2013    CAD (coronary artery disease), native coronary artery 11/22/2013   Dyspnea on exertion 11/03/2013   Undiagnosed cardiac murmurs 09/05/2013   Preventative health care 09/05/2013   Musculoskeletal pain 11/10/2012   Vasomotor rhinitis 04/05/2012   Obstructive sleep apnea 04/05/2012   Anemia 04/05/2012   Elevated LFTs 04/05/2012   OA (osteoarthritis) of knee 04/05/2012   Hernia 10/13/2010   Hyperlipidemia 09/11/2007   Anxiety and depression 09/11/2007   Essential hypertension 09/11/2007   GERD 09/11/2007   RENAL CALCULUS 09/11/2007   RENAL CYST 09/11/2007   COLONIC POLYPS, HX OF 09/11/2007   REFERRING PROVIDER: [name], MD   REFERRING DIAG: Weakness of both lower extremities   THERAPY DIAG:  Muscle weakness (generalized)  Unsteadiness on feet  Rationale for Evaluation and Treatment: Rehabilitation  ONSET DATE: October 2023  SUBJECTIVE:   SUBJECTIVE STATEMENT:  No new complaints. Running behind today.  PERTINENT HISTORY: HTN, CHF, CAD, DM, history of breast cancer, OA, anxiety, depression, lymphedema, and history of bilateral TKA's   PAIN:  Are you having pain? No.  PRECAUTIONS: Fall  NEXT MD VISIT: 06/11/22  OBJECTIVE:   LOWER EXTREMITY MMT:  MMT Right eval Left eval  Hip flexion 3+/5 4-/5  Hip extension    Hip abduction    Hip adduction    Hip internal rotation    Hip external rotation    Knee flexion 4/5 4/5  Knee extension 4-/5 4-/5  Ankle dorsiflexion 4-/5 4-/5  Ankle plantarflexion    Ankle inversion    Ankle eversion     (Blank rows = not tested)  TODAY'S TREATMENT:                                                                                                                              DATE:                                    07/19/22 EXERCISE LOG  Exercise Repetitions and Resistance Comments  Nustep  L4 x 15 minutes   Semitandem on airex X2 min   Knee ext 10# x30 reps   Ham curls 30# x30 reps   NBOS on airex X3 min   Toe taps  to 8" step Alternating x15 reps   Siestepping // bars X5 reps        Blank cell = exercise not performed today   PATIENT EDUCATION:  Education details: POC, prognosis, strengthening, and goals for therapy  Person educated: Patient Education method: Explanation Education comprehension: verbalized understanding  HOME EXERCISE PROGRAM:  ASSESSMENT:  CLINICAL IMPRESSION: Patient arrived late to clinic today but able to tolerate treatment well. Patient did fairly well with therex today but struggled with modified SLS balance activities. Patient states that she still struggles with reciprical stair gait and getting into her car as she has to go more SLS for those activities. Intermittent UE support required for balance activities. Patient also admitted to some fear with SLS actvities due to history of falls.  OBJECTIVE IMPAIRMENTS: Abnormal gait, decreased activity tolerance, decreased balance, decreased mobility, difficulty walking, decreased strength, postural dysfunction, and pain.   ACTIVITY LIMITATIONS: carrying, lifting, standing, squatting, stairs, transfers, and locomotion level  PARTICIPATION LIMITATIONS: meal prep, cleaning, shopping, and community activity  PERSONAL FACTORS: Time since onset of injury/illness/exacerbation and 3+ comorbidities: HTN, CHF, CAD, DM, history of breast cancer, OA, anxiety, depression, lymphedema, and history of bilateral TKA's   are also affecting patient's functional outcome.   REHAB POTENTIAL: Good  CLINICAL DECISION MAKING: Evolving/moderate complexity  EVALUATION COMPLEXITY: Moderate  GOALS: Goals reviewed with patient? Yes  SHORT  TERM GOALS: Target date: 07/02/22 Patient will be independent with her initial HEP. Baseline: Goal status: Met  2.  Patient will improve her tandem stance to at least 15 seconds bilaterally for improved lower extremity stability. Baseline:  Goal status: MET  3.  Patient will be able to navigate at least 4 steps  with a reciprocal pattern for improved household mobility. Baseline:  Goal status: Partially met  LONG TERM GOALS: Target date: 07/23/22  Patient will be independent with her advanced HEP. Baseline:  Goal status: Partially met  2.  Patient will be able to walk for at least 15 minutes without being limited by fatigue. Baseline: Can only walk about 5  minutes currently. Goal status: Partially met  3.  Patient will be able to navigate at least 8 steps with a reciprocal pattern for improved household mobility. Baseline: be able to navigate at least 8 step; currently using a step to pattern Goal status: On- going  4.  Patient will improve her 5 times sit to stand time to 12 seconds or less for improved lower extremity power. Baseline:  Goal status: On-going  PLAN:  PT FREQUENCY: 1-2x/week  PT DURATION: 6 weeks  PLANNED INTERVENTIONS: Therapeutic exercises, Therapeutic activity, Neuromuscular re-education, Balance training, Gait training, Patient/Family education, Self Care, Stair training, and Re-evaluation  PLAN FOR NEXT SESSION: DC but focus on SLS actvities.  Standley Brooking, PTA 07/19/2022, 10:01 AM

## 2022-07-22 ENCOUNTER — Inpatient Hospital Stay: Payer: Medicare Other | Attending: Family

## 2022-07-22 ENCOUNTER — Encounter: Payer: Self-pay | Admitting: Family

## 2022-07-22 ENCOUNTER — Other Ambulatory Visit: Payer: Self-pay

## 2022-07-22 ENCOUNTER — Inpatient Hospital Stay (HOSPITAL_BASED_OUTPATIENT_CLINIC_OR_DEPARTMENT_OTHER): Payer: Medicare Other | Admitting: Family

## 2022-07-22 VITALS — BP 147/86 | HR 68 | Temp 98.9°F | Resp 19

## 2022-07-22 DIAGNOSIS — D500 Iron deficiency anemia secondary to blood loss (chronic): Secondary | ICD-10-CM | POA: Diagnosis not present

## 2022-07-22 DIAGNOSIS — K922 Gastrointestinal hemorrhage, unspecified: Secondary | ICD-10-CM

## 2022-07-22 DIAGNOSIS — C50912 Malignant neoplasm of unspecified site of left female breast: Secondary | ICD-10-CM | POA: Insufficient documentation

## 2022-07-22 DIAGNOSIS — Z170 Estrogen receptor positive status [ER+]: Secondary | ICD-10-CM | POA: Diagnosis not present

## 2022-07-22 DIAGNOSIS — C50412 Malignant neoplasm of upper-outer quadrant of left female breast: Secondary | ICD-10-CM

## 2022-07-22 DIAGNOSIS — D649 Anemia, unspecified: Secondary | ICD-10-CM

## 2022-07-22 DIAGNOSIS — Z79811 Long term (current) use of aromatase inhibitors: Secondary | ICD-10-CM | POA: Insufficient documentation

## 2022-07-22 LAB — CBC WITH DIFFERENTIAL (CANCER CENTER ONLY)
Abs Immature Granulocytes: 0.01 10*3/uL (ref 0.00–0.07)
Basophils Absolute: 0 10*3/uL (ref 0.0–0.1)
Basophils Relative: 0 %
Eosinophils Absolute: 0 10*3/uL (ref 0.0–0.5)
Eosinophils Relative: 0 %
HCT: 43.5 % (ref 36.0–46.0)
Hemoglobin: 13.5 g/dL (ref 12.0–15.0)
Immature Granulocytes: 0 %
Lymphocytes Relative: 23 %
Lymphs Abs: 1.5 10*3/uL (ref 0.7–4.0)
MCH: 28.2 pg (ref 26.0–34.0)
MCHC: 31 g/dL (ref 30.0–36.0)
MCV: 90.8 fL (ref 80.0–100.0)
Monocytes Absolute: 0.3 10*3/uL (ref 0.1–1.0)
Monocytes Relative: 5 %
Neutro Abs: 4.7 10*3/uL (ref 1.7–7.7)
Neutrophils Relative %: 72 %
Platelet Count: 248 10*3/uL (ref 150–400)
RBC: 4.79 MIL/uL (ref 3.87–5.11)
RDW: 14.2 % (ref 11.5–15.5)
WBC Count: 6.5 10*3/uL (ref 4.0–10.5)
nRBC: 0 % (ref 0.0–0.2)

## 2022-07-22 LAB — RETICULOCYTES
Immature Retic Fract: 16.6 % — ABNORMAL HIGH (ref 2.3–15.9)
RBC.: 4.71 MIL/uL (ref 3.87–5.11)
Retic Count, Absolute: 82 10*3/uL (ref 19.0–186.0)
Retic Ct Pct: 1.7 % (ref 0.4–3.1)

## 2022-07-22 LAB — IRON AND IRON BINDING CAPACITY (CC-WL,HP ONLY)
Iron: 82 ug/dL (ref 28–170)
Saturation Ratios: 15 % (ref 10.4–31.8)
TIBC: 542 ug/dL — ABNORMAL HIGH (ref 250–450)
UIBC: 460 ug/dL — ABNORMAL HIGH (ref 148–442)

## 2022-07-22 LAB — FERRITIN: Ferritin: 11 ng/mL (ref 11–307)

## 2022-07-22 NOTE — Progress Notes (Signed)
Hematology and Oncology Follow Up Visit  [name] [id number] 06-15-1951 72 y.o. 07/22/2022   Principle Diagnosis:  Left breast IDC, grade 3, ER +, PR - and HER2 +, stage Ia (Diagnosed 05/13/2016) - [name] Iron deficiency anemia   Past Therapy: Lumpectomy (06/21/2016) Taxol weekly x 12, Herceptin and Perjeta every 3 weeks (07/25/2016 - 01/09/2017) Adjuvant radiation therapy (10/31/2016 - 11/28/2016)  Current Therapy:   Arimidex 1 mg PO daily - started 12/2016 - present - Managed by Dr. Lindi Adie    Interim History:  [name] is here today for follow-up. She is doing well and feeling much better since receiving IV iron.  She has not noted any obvious blood loss. No abnormal bruising, no petechiae.  No fatigue at this time.  No fever, chills, n/v, cough, rash, dizziness, SOB, chest pain, palpitations, abdominal pain or changes in bowel or bladder habits.  She is scheduled for her annual mammogram in March 2024. Last years was negative.  No swelling, tenderness, numbness or tingling in her extremities.  No falls or syncope.  Appetite and hydration are good. Weight is stable at 275 lbs.   ECOG Performance Status: 1 - Symptomatic but completely ambulatory  Medications:  Allergies as of 07/22/2022       Reactions   Oxycodone Other (See Comments)   Does not feel good. Makes her sleepy.        Medication List        Accurate as of July 22, 2022 11:54 AM. If you have any questions, ask your nurse or doctor.          Accu-Chek Softclix Lancets lancets Use to check blood glucose up to twice a day (Dx: Type 2 DM with long term insulin use E11.57, Z79.4)   acetaminophen 500 MG tablet Commonly known as: TYLENOL Take 1,000 mg by mouth 2 (two) times daily as needed for moderate pain or headache.   albuterol 108 (90 Base) MCG/ACT inhaler Commonly known as: Ventolin HFA Inhale 2 puffs every 4-6 hours as needed - rescue.   anastrozole 1 MG tablet Commonly known as:  ARIMIDEX Take 1 tablet (1 mg total) by mouth daily.   aspirin EC 81 MG tablet Take 81 mg by mouth daily. Swallow whole.   blood glucose meter kit and supplies Dispense based on patient and insurance preference. Use up to four times daily as directed. (FOR ICD-10 E10.9, E11.9).   budesonide-formoterol 160-4.5 MCG/ACT inhaler Commonly known as: SYMBICORT Inhale 2 puffs into the lungs 2 (two) times daily. As needed   busPIRone 7.5 MG tablet Commonly known as: BUSPAR Take 1 tablet (7.5 mg total) by mouth 3 (three) times daily. As needed for anxiety   carvedilol 25 MG tablet Commonly known as: COREG Take 1 tablet (25 mg total) by mouth 2 (two) times daily with a meal.   diltiazem 120 MG 24 hr capsule Commonly known as: CARDIZEM CD TAKE ONE CAPSULE BY MOUTH DAILY   escitalopram 20 MG tablet Commonly known as: LEXAPRO TAKE ONE TABLET BY MOUTH EVERY MORNING AND ONE-HALF TABLET EVERY EVENING   esomeprazole 40 MG capsule Commonly known as: NEXIUM Take 1 capsule (40 mg total) by mouth daily.   ezetimibe 10 MG tablet Commonly known as: ZETIA Take 1 tablet (10 mg total) by mouth daily.   fenofibrate 145 MG tablet Commonly known as: TRICOR TAKE ONE TABLET ONCE DAILY   fluticasone 50 MCG/ACT nasal spray Commonly known as: FLONASE Place 2 sprays into both nostrils daily.  furosemide 20 MG tablet Commonly known as: LASIX TAKE ONE TABLET ONCE DAILY   glucose blood test strip Commonly known as: Accu-Chek Aviva Twice daily (please use brand that patient insurance covers)   Hemocyte-F 324-1 MG Tabs Generic drug: Ferrous Fumarate-Folic Acid 1 tablet po daily   Insulin Pen Needle 32G X 4 MM Misc Use 1x a day   levothyroxine 50 MCG tablet Commonly known as: SYNTHROID TAKE ONE TABLET ONCE DAILY   levothyroxine 200 MCG tablet Commonly known as: SYNTHROID Take 1 tablet (200 mcg total) by mouth daily. Along with 61mg tablet for a total daily dose of 2539m.   loratadine 10  MG tablet Commonly known as: CLARITIN Take 1 tablet (10 mg total) by mouth daily.   losartan 100 MG tablet Commonly known as: COZAAR TAKE ONE TABLET DAILY   metFORMIN 500 MG 24 hr tablet Commonly known as: GLUCOPHAGE-XR Take 4 tablets (2,000 mg total) by mouth daily with supper.   montelukast 10 MG tablet Commonly known as: SINGULAIR TAKE ONE TABLET AT BEDTIME   Ozempic (1 MG/DOSE) 4 MG/3ML Sopn Generic drug: Semaglutide (1 MG/DOSE) Inject 1 mg into the skin once a week.   rosuvastatin 40 MG tablet Commonly known as: CRESTOR TAKE ONE TABLET DAILY   Tresiba FlexTouch 200 UNIT/ML FlexTouch Pen Generic drug: insulin degludec Inject 12-20 Units into the skin daily. What changed: how much to take   Vitamin D (Ergocalciferol) 1.25 MG (50000 UNIT) Caps capsule Commonly known as: DRISDOL Take 1 capsule (50,000 Units total) by mouth every 7 (seven) days.        Allergies:  Allergies  Allergen Reactions   Oxycodone Other (See Comments)    Does not feel good. Makes her sleepy.    Past Medical History, Surgical history, Social history, and Family History were reviewed and updated.  Review of Systems: All other 10 point review of systems is negative.   Physical Exam:  oral temperature is 98.9 F (37.2 C). Her blood pressure is 147/86 (abnormal) and her pulse is 68. Her respiration is 19 and oxygen saturation is 97%.   Wt Readings from Last 3 Encounters:  06/21/22 278 lb (126.1 kg)  06/20/22 278 lb 9.6 oz (126.4 kg)  06/11/22 277 lb (125.6 kg)    Ocular: Sclerae unicteric, pupils equal, round and reactive to light Ear-nose-throat: Oropharynx clear, dentition fair Lymphatic: No cervical or supraclavicular adenopathy Lungs no rales or rhonchi, good excursion bilaterally Heart regular rate and rhythm, no murmur appreciated Abd soft, nontender, positive bowel sounds MSK no focal spinal tenderness, no joint edema Neuro: non-focal, well-oriented, appropriate  affect Breasts: Deferred   Lab Results  Component Value Date   WBC 6.5 07/22/2022   HGB 13.5 07/22/2022   HCT 43.5 07/22/2022   MCV 90.8 07/22/2022   PLT 248 07/22/2022   Lab Results  Component Value Date   FERRITIN 93 05/21/2022   IRON 87 05/21/2022   TIBC 428 05/21/2022   UIBC 341 05/21/2022   IRONPCTSAT 20 05/21/2022   Lab Results  Component Value Date   RETICCTPCT 1.7 07/22/2022   RBC 4.71 07/22/2022   No results found for: "KPAFRELGTCHN", "LAMBDASER", "KAPLAMBRATIO" No results found for: "IGGSERUM", "IGA", "IGMSERUM" No results found for: "TOTALPROTELP", "ALBUMINELP", "A1GS", "A2GS", "BETS", "BETA2SER", "GAMS", "MSPIKE", "SPEI"   Chemistry      Component Value Date/Time   NA 138 04/29/2022 1016   NA 141 07/14/2019 0739   NA 145 04/11/2017 0942   K 4.7 specimen hemolyzed 04/29/2022 1016  K 4.4 04/11/2017 0942   CL 103 04/29/2022 1016   CO2 28 04/29/2022 1016   CO2 26 04/11/2017 0942   BUN 11 04/29/2022 1016   BUN 18 07/14/2019 0739   BUN 15.1 04/11/2017 0942   CREATININE 0.54 04/29/2022 1016   CREATININE 0.74 06/02/2020 1427   CREATININE 0.8 04/11/2017 0942      Component Value Date/Time   CALCIUM 9.5 04/29/2022 1016   CALCIUM 10.0 04/11/2017 0942   ALKPHOS 58 04/24/2022 1556   ALKPHOS 93 04/11/2017 0942   AST 24 04/24/2022 1556   AST 33 10/06/2017 1010   AST 41 (H) 04/11/2017 0942   ALT 15 04/24/2022 1556   ALT 30 10/06/2017 1010   ALT 40 04/11/2017 0942   BILITOT 0.3 04/24/2022 1556   BILITOT 0.4 07/14/2019 0739   BILITOT 0.5 10/06/2017 1010   BILITOT 0.48 04/11/2017 0942       Impression and Plan: Ms. Antwine is a very pleasant 72 yo caucasian female with recent anemia secondary to GI blood loss.  Follow-up in 3 months.   Lottie Dawson, NP 1/29/202411:54 AM

## 2022-07-23 ENCOUNTER — Ambulatory Visit: Payer: Medicare Other

## 2022-07-23 DIAGNOSIS — R2681 Unsteadiness on feet: Secondary | ICD-10-CM | POA: Diagnosis not present

## 2022-07-23 DIAGNOSIS — M6281 Muscle weakness (generalized): Secondary | ICD-10-CM | POA: Diagnosis not present

## 2022-07-23 NOTE — Therapy (Signed)
OUTPATIENT PHYSICAL THERAPY LOWER EXTREMITY TREATMENT   Patient Name: [name] MRN: [id number] DOB:September 19, 1950, 72 y.o., female Today's Date: 07/23/2022  END OF SESSION:  PT End of Session - 07/23/22 0907     Visit Number 12    Number of Visits 12    Date for PT Re-Evaluation 08/16/22    PT Start Time 0905    PT Stop Time 0945    PT Time Calculation (min) 40 min    Activity Tolerance Patient tolerated treatment well    Behavior During Therapy [facility] for tasks assessed/performed            Past Medical History:  Diagnosis Date   Anemia 04/05/2012   Anxiety    Anxiety state 09/11/2007   Qualifier: Diagnosis of  By: Sherren Mocha RN, Dorian Pod     Asthma    Atrial tachycardia, paroxysmal    Back pain 10/02/2014   Breast cancer ([place]) 02/17/2017   CAD (coronary artery disease), native coronary artery    remote Cath with nonobstructive ASCAD with 20% mid LAD, 20% prox left circ and 20% ostial RCA   Chicken pox as a child   Chronic diastolic CHF (congestive heart failure), NYHA class 1 ([place])    Complication of anesthesia    pt states feels different in her body after anesthesia when waking up and also experiences a smell of burnt plastic for approx a wk    Constipation    Depression    Diabetes ([place])    Dilated aortic root ([place])    57m by echo 08/2020   Dysuria 02/17/2017   Elevated LFTs 04/05/2012   GERD (gastroesophageal reflux disease)    Goiter    Heart murmur    hx of one at birth    History of kidney stones    History of radiation therapy 10/31/16-12/17/16   left breast 45 Gy in 25 fractions, left breast boost 16 Gy in 8 fractions   Hx of colonic polyps    Hyperlipidemia    Hypertension    Insomnia 10/08/2016   Joint pain    [place]align[place] 05/14[place]   not with patient   Measles as a child   Mumps as a child   OA (osteoarthritis) of knee 04/05/2012   Obesity 07/11/2014   PONV (postoperative nausea and vomiting)     Preventative health care 09/05/2013   PVC's (premature ventricular contractions) 05/02/2016   Shortness of breath dyspnea    walking distances / climbing stairs   Sleep apnea 04/05/2012   Tinea corporis 02/23/2013   Type 2 diabetes mellitus with hyperglycemia ([place] 12/31/2013   Vasomotor rhinitis 04/05/2012   Ventral hernia    Wheezing    Past Surgical History:  Procedure Laterality Date   BREAST LUMPECTOMY Left 2017   BREAST LUMPECTOMY WITH RADIOACTIVE SEED AND SENTINEL LYMPH NODE BIOPSY Left 06/21/2016   Procedure: LEFT BREAST LUMPECTOMY WITH RADIOACTIVE SEED AND SENTINEL LYMPH NODE BIOPSY, INJECT BLUE DYE LEFT BREAST;  Surgeon: HFanny Skates MD;  Location: [facility] OR;  Service: General;  Laterality: Left;   CARDIAC CATHETERIZATION     normal coroary arteries per patient   CARDIOVASCULAR STRESS TEST     10/12/2013   CESAREAN SECTION     X 3   CHOLECYSTECTOMY     COLONOSCOPY WITH PROPOFOL N/A 03/28/2015   Procedure: COLONOSCOPY WITH PROPOFOL;  Surgeon: JJuanita Craver MD;  Location: [facility] ENDOSCOPY;  Service: Endoscopy;  Laterality: N/A;   HERNIA  REPAIR  02/08/2011   ventral hernia   JOINT REPLACEMENT     bilateral   KNEE ARTHROSCOPY  05/2010   bilateral   LEFT AND RIGHT HEART CATHETERIZATION WITH CORONARY ANGIOGRAM N/A 11/08/2013   Procedure: LEFT AND RIGHT HEART CATHETERIZATION WITH CORONARY ANGIOGRAM;  Surgeon: [name], MD;  Location: [facility] CATH LAB;  Service: Cardiovascular;  Laterality: N/A;   MENISCUS REPAIR  2009   MOUTH SURGERY     teeth implants   PORT-A-CATH REMOVAL N/A 01/24/2017   Procedure: REMOVAL PORT-A-CATH;  Surgeon: Fanny Skates, MD;  Location: [place];  Service: General;  Laterality: N/A;   PORTACATH PLACEMENT Right 07/16/2016   Procedure: INSERTION PORT-A-CATH RIGHT INTERNAL JUGULAR WITH ULTRASOUND;  Surgeon: Fanny Skates, MD;  Location: [place];  Service: General;  Laterality: Right;   REPAIR DURAL / CSF LEAK  2021   performed at [facility] in [place]  2011   left   TOTAL KNEE ARTHROPLASTY Right 05/10/2014   Procedure: RIGHT TOTAL KNEE ARTHROPLASTY;  Surgeon: Mauri Pole, MD;  Location: [facility] ORS;  Service: Orthopedics;  Laterality: Right;   TOTAL THYROIDECTOMY Bilateral 2022   TUBAL LIGATION     WISDOM TOOTH EXTRACTION  2000   Patient Active Problem List   Diagnosis Date Noted   Asthma 06/11/2022   IDA (iron deficiency anemia) 04/30/2022   Lower GI bleed 04/29/2022   Allergic rhinitis 11/09/2021   Hx of papillary thyroid carcinoma 07/19/2021   Poorly controlled type 2 diabetes mellitus with circulatory disorder ([place]) 07/19/2021   Atypical chest pain 07/16/2021   Urinary incontinence 07/16/2021   Hypothyroidism 03/20/2021   Nausea 12/06/2020   Muscle cramps 12/06/2020   H/O thyroidectomy 08/30/2020   CSF leak from nose 01/13/2020   Uncontrolled type 2 diabetes mellitus with hyperglycemia ([place]) 04/14/2018   Vitamin D deficiency 03/30/2018   Dental infection 03/29/2018   Dysphagia 12/28/2017   Cough 12/28/2017   Peripheral neuropathy 09/22/2017   Lipoma 09/22/2017   Lymphedema 05/22/2017   Asthmatic bronchitis, mild intermittent, uncomplicated 98/33/8250   Breast cancer ([place]) 02/17/2017   Dysuria 02/17/2017   Insomnia 10/08/2016   Dilated aortic root ([place])    Port catheter in place 08/01/2016   Malignant neoplasm of upper-outer quadrant of left female breast ([place]) 05/14/2016   PVC's (premature ventricular contractions) 05/02/2016   Neck mass 04/21/2016   LVH (left ventricular hypertrophy) due to hypertensive disease, with heart failure ([place]) 04/18/2016   Tonsillar hypertrophy 07/07/2015   Back pain 10/02/2014   Obesity 07/11/2014   S/P right TKA 05/10/2014   S/P knee replacement 05/10/2014   Atrial tachycardia, paroxysmal 02/22/2014   Type 2 diabetes mellitus with hyperglycemia ([place]) 12/31/2013   Chronic diastolic CHF (congestive heart failure) ([place]) 11/22/2013   CAD  (coronary artery disease), native coronary artery 11/22/2013   Dyspnea on exertion 11/03/2013   Undiagnosed cardiac murmurs 09/05/2013   Preventative health care 09/05/2013   Musculoskeletal pain 11/10/2012   Vasomotor rhinitis 04/05/2012   Obstructive sleep apnea 04/05/2012   Anemia 04/05/2012   Elevated LFTs 04/05/2012   OA (osteoarthritis) of knee 04/05/2012   Hernia 10/13/2010   Hyperlipidemia 09/11/2007   Anxiety and depression 09/11/2007   Essential hypertension 09/11/2007   GERD 09/11/2007   RENAL CALCULUS 09/11/2007   RENAL CYST 09/11/2007   COLONIC POLYPS, HX OF 09/11/2007   REFERRING PROVIDER: [name], MD   REFERRING DIAG: Weakness of both lower extremities   THERAPY DIAG:  Muscle weakness (generalized)  Unsteadiness on feet  Rationale for Evaluation and Treatment: Rehabilitation  ONSET DATE: October 2023  SUBJECTIVE:   SUBJECTIVE STATEMENT:  Patient reports that she is stiff this morning. She also feels tired and has been for the past few days. She notes that she feels more stable and confident while she is walking.   PERTINENT HISTORY: HTN, CHF, CAD, DM, history of breast cancer, OA, anxiety, depression, lymphedema, and history of bilateral TKA's   PAIN:  Are you having pain? No.  PRECAUTIONS: Fall  NEXT MD VISIT: 06/11/22  OBJECTIVE:   LOWER EXTREMITY MMT:  MMT Right eval Left eval  Hip flexion 3+/5 4-/5  Hip extension    Hip abduction    Hip adduction    Hip internal rotation    Hip external rotation    Knee flexion 4/5 4/5  Knee extension 4-/5 4-/5  Ankle dorsiflexion 4-/5 4-/5  Ankle plantarflexion    Ankle inversion    Ankle eversion     (Blank rows = not tested)  TODAY'S TREATMENT:                                                                                                                              DATE:                                     1/30 EXERCISE LOG  Exercise Repetitions and Resistance Comments  Nustep   L4 x 16 minutes   Cybex knee extension 10# x 3 minutes   Cybex knee flexion  30# x 3 minutes   Toe taps on 8" step  2 minutes  Fingertip support  Tandem balance on floor  4 x 20 seconds   Marching on floor  3 min w/ 5 second hold     Blank cell = exercise not performed today                                    07/19/22 EXERCISE LOG  Exercise Repetitions and Resistance Comments  Nustep  L4 x 15 minutes   Semitandem on airex X2 min   Knee ext 10# x30 reps   Ham curls 30# x30 reps   NBOS on airex X3 min   Toe taps to 8" step Alternating x15 reps   Siestepping // bars X5 reps        Blank cell = exercise not performed today   PATIENT EDUCATION:  Education details: POC, prognosis, strengthening, and goals for therapy  Person educated: Patient Education method: Explanation Education comprehension: verbalized understanding  HOME EXERCISE PROGRAM:  ASSESSMENT:  CLINICAL IMPRESSION: Patient was able to partially meet her goals for skilled physical therapy as evidenced by her subjective reports, objective measures, functional mobility, and progress toward her goals. She was able  to slightly improve her five time sit to stand time, but she was unable to meet her long term goal. Her HEP was reviewed and updated. She reported feeling comfortable with these interventions. She reported feeling good upon the conclusion of today's interventions. She felt comfortable being discharged at this time.   PHYSICAL THERAPY DISCHARGE SUMMARY  Visits from Start of Care: 12  Current functional level related to goals / functional outcomes: Patient was able to partially meet her goals for therapy.    Remaining deficits: Lower extremity power    Education / Equipment: HEP    Patient agrees to discharge. Patient goals were partially met. Patient is being discharged due to being pleased with the current functional level.   OBJECTIVE IMPAIRMENTS: Abnormal gait, decreased activity tolerance, decreased  balance, decreased mobility, difficulty walking, decreased strength, postural dysfunction, and pain.   ACTIVITY LIMITATIONS: carrying, lifting, standing, squatting, stairs, transfers, and locomotion level  PARTICIPATION LIMITATIONS: meal prep, cleaning, shopping, and community activity  PERSONAL FACTORS: Time since onset of injury/illness/exacerbation and 3+ comorbidities: HTN, CHF, CAD, DM, history of breast cancer, OA, anxiety, depression, lymphedema, and history of bilateral TKA's   are also affecting patient's functional outcome.   REHAB POTENTIAL: Good  CLINICAL DECISION MAKING: Evolving/moderate complexity  EVALUATION COMPLEXITY: Moderate  GOALS: Goals reviewed with patient? Yes  SHORT TERM GOALS: Target date: 07/02/22 Patient will be independent with her initial HEP. Baseline: Goal status: Met  2.  Patient will improve her tandem stance to at least 15 seconds bilaterally for improved lower extremity stability. Baseline:  Goal status: MET  3.  Patient will be able to navigate at least 4 steps with a reciprocal pattern for improved household mobility. Baseline:  Goal status: Partially met  LONG TERM GOALS: Target date: 07/23/22  Patient will be independent with her advanced HEP. Baseline: "I don't do them as much as I should." Goal status: Partially met  2.  Patient will be able to walk for at least 15 minutes without being limited by fatigue. Baseline: about 5-10 minutes Goal status: Partially met  3.  Patient will be able to navigate at least 8 steps with a reciprocal pattern for improved household mobility. Baseline: be able to navigate at least 8 step; currently using a step to pattern Goal status: partially met  4.  Patient will improve her 5 times sit to stand time to 12 seconds or less for improved lower extremity power. Baseline: 14.03 seconds Goal status: Not Met  PLAN:  PT FREQUENCY: 1-2x/week  PT DURATION: 6 weeks  PLANNED INTERVENTIONS: Therapeutic  exercises, Therapeutic activity, Neuromuscular re-education, Balance training, Gait training, Patient/Family education, Self Care, Stair training, and Re-evaluation  PLAN FOR NEXT SESSION: DC but focus on SLS actvities.  Darlin Coco, PT 07/23/2022, 12:55 PM

## 2022-07-25 ENCOUNTER — Ambulatory Visit: Payer: Medicare Other | Admitting: Family Medicine

## 2022-08-13 DIAGNOSIS — J328 Other chronic sinusitis: Secondary | ICD-10-CM | POA: Diagnosis not present

## 2022-08-13 DIAGNOSIS — G9600 Cerebrospinal fluid leak, unspecified: Secondary | ICD-10-CM | POA: Diagnosis not present

## 2022-08-14 ENCOUNTER — Ambulatory Visit (INDEPENDENT_AMBULATORY_CARE_PROVIDER_SITE_OTHER): Payer: Medicare Other | Admitting: *Deleted

## 2022-08-14 DIAGNOSIS — Z780 Asymptomatic menopausal state: Secondary | ICD-10-CM

## 2022-08-14 DIAGNOSIS — Z0000 Encounter for general adult medical examination without abnormal findings: Secondary | ICD-10-CM | POA: Diagnosis not present

## 2022-08-14 NOTE — Patient Instructions (Signed)
[name] , Thank you for taking time to come for your Medicare Wellness Visit. I appreciate your ongoing commitment to your health goals. Please review the following plan we discussed and let me know if I can assist you in the future.    This is a list of the screening recommended for you and due dates:  Health Maintenance  Topic Date Due   Yearly kidney health urinalysis for diabetes  Never done   COVID-19 Vaccine (5 - 2023-24 season) 02/22/2022   Colon Cancer Screening  03/27/2022   Eye exam for diabetics  10/25/2022   Hemoglobin A1C  12/20/2022   Complete foot exam   02/15/2023   Yearly kidney function blood test for diabetes  04/30/2023   Medicare Annual Wellness Visit  08/15/2023   Mammogram  09/13/2023   DTaP/Tdap/Td vaccine (2 - Td or Tdap) 10/03/2031   Pneumonia Vaccine  Completed   Flu Shot  Completed   DEXA scan (bone density measurement)  Completed   Hepatitis C Screening: USPSTF Recommendation to screen - Ages 59-79 yo.  Completed   Zoster (Shingles) Vaccine  Completed   HPV Vaccine  Aged Out     Next appointment: Follow up in one year for your annual wellness visit.   Preventive Care 52 Years and Older, Female Preventive care refers to lifestyle choices and visits with your health care provider that can promote health and wellness. What does preventive care include? A yearly physical exam. This is also called an annual well check. Dental exams once or twice a year. Routine eye exams. Ask your health care provider how often you should have your eyes checked. Personal lifestyle choices, including: Daily care of your teeth and gums. Regular physical activity. Eating a healthy diet. Avoiding tobacco and drug use. Limiting alcohol use. Practicing safe sex. Taking low-dose aspirin every day. Taking vitamin and mineral supplements as recommended by your health care provider. What happens during an annual well check? The services and screenings done by your health  care provider during your annual well check will depend on your age, overall health, lifestyle risk factors, and family history of disease. Counseling  Your health care provider may ask you questions about your: Alcohol use. Tobacco use. Drug use. Emotional well-being. Home and relationship well-being. Sexual activity. Eating habits. History of falls. Memory and ability to understand (cognition). Work and work Statistician. Reproductive health. Screening  You may have the following tests or measurements: Height, weight, and BMI. Blood pressure. Lipid and cholesterol levels. These may be checked every 5 years, or more frequently if you are over 40 years old. Skin check. Lung cancer screening. You may have this screening every year starting at age 1 if you have a 30-pack-year history of smoking and currently smoke or have quit within the past 15 years. Fecal occult blood test (FOBT) of the stool. You may have this test every year starting at age 89. Flexible sigmoidoscopy or colonoscopy. You may have a sigmoidoscopy every 5 years or a colonoscopy every 10 years starting at age 52. Hepatitis C blood test. Hepatitis B blood test. Sexually transmitted disease (STD) testing. Diabetes screening. This is done by checking your blood sugar (glucose) after you have not eaten for a while (fasting). You may have this done every 1-3 years. Bone density scan. This is done to screen for osteoporosis. You may have this done starting at age 82. Mammogram. This may be done every 1-2 years. Talk to your health care provider about how often  you should have regular mammograms. Talk with your health care provider about your test results, treatment options, and if necessary, the need for more tests. Vaccines  Your health care provider may recommend certain vaccines, such as: Influenza vaccine. This is recommended every year. Tetanus, diphtheria, and acellular pertussis (Tdap, Td) vaccine. You may need a Td  booster every 10 years. Zoster vaccine. You may need this after age 73. Pneumococcal 13-valent conjugate (PCV13) vaccine. One dose is recommended after age 22. Pneumococcal polysaccharide (PPSV23) vaccine. One dose is recommended after age 13. Talk to your health care provider about which screenings and vaccines you need and how often you need them. This information is not intended to replace advice given to you by your health care provider. Make sure you discuss any questions you have with your health care provider. Document Released: 07/07/2015 Document Revised: 02/28/2016 Document Reviewed: 04/11/2015 Elsevier Interactive Patient Education  2017 [place] Prevention in the Home Falls can cause injuries. They can happen to people of all ages. There are many things you can do to make your home safe and to help prevent falls. What can I do on the outside of my home? Regularly fix the edges of walkways and driveways and fix any cracks. Remove anything that might make you trip as you walk through a door, such as a raised step or threshold. Trim any bushes or trees on the path to your home. Use bright outdoor lighting. Clear any walking paths of anything that might make someone trip, such as rocks or tools. Regularly check to see if handrails are loose or broken. Make sure that both sides of any steps have handrails. Any raised decks and porches should have guardrails on the edges. Have any leaves, snow, or ice cleared regularly. Use sand or salt on walking paths during winter. Clean up any spills in your garage right away. This includes oil or grease spills. What can I do in the bathroom? Use night lights. Install grab bars by the toilet and in the tub and shower. Do not use towel bars as grab bars. Use non-skid mats or decals in the tub or shower. If you need to sit down in the shower, use a plastic, non-slip stool. Keep the floor dry. Clean up any water that spills on the floor  as soon as it happens. Remove soap buildup in the tub or shower regularly. Attach bath mats securely with double-sided non-slip rug tape. Do not have throw rugs and other things on the floor that can make you trip. What can I do in the bedroom? Use night lights. Make sure that you have a light by your bed that is easy to reach. Do not use any sheets or blankets that are too big for your bed. They should not hang down onto the floor. Have a firm chair that has side arms. You can use this for support while you get dressed. Do not have throw rugs and other things on the floor that can make you trip. What can I do in the kitchen? Clean up any spills right away. Avoid walking on wet floors. Keep items that you use a lot in easy-to-reach places. If you need to reach something above you, use a strong step stool that has a grab bar. Keep electrical cords out of the way. Do not use floor polish or wax that makes floors slippery. If you must use wax, use non-skid floor wax. Do not have throw rugs and other things on  the floor that can make you trip. What can I do with my stairs? Do not leave any items on the stairs. Make sure that there are handrails on both sides of the stairs and use them. Fix handrails that are broken or loose. Make sure that handrails are as long as the stairways. Check any carpeting to make sure that it is firmly attached to the stairs. Fix any carpet that is loose or worn. Avoid having throw rugs at the top or bottom of the stairs. If you do have throw rugs, attach them to the floor with carpet tape. Make sure that you have a light switch at the top of the stairs and the bottom of the stairs. If you do not have them, ask someone to add them for you. What else can I do to help prevent falls? Wear shoes that: Do not have high heels. Have rubber bottoms. Are comfortable and fit you well. Are closed at the toe. Do not wear sandals. If you use a stepladder: Make sure that it is  fully opened. Do not climb a closed stepladder. Make sure that both sides of the stepladder are locked into place. Ask someone to hold it for you, if possible. Clearly mark and make sure that you can see: Any grab bars or handrails. First and last steps. Where the edge of each step is. Use tools that help you move around (mobility aids) if they are needed. These include: Canes. Walkers. Scooters. Crutches. Turn on the lights when you go into a dark area. Replace any light bulbs as soon as they burn out. Set up your furniture so you have a clear path. Avoid moving your furniture around. If any of your floors are uneven, fix them. If there are any pets around you, be aware of where they are. Review your medicines with your doctor. Some medicines can make you feel dizzy. This can increase your chance of falling. Ask your doctor what other things that you can do to help prevent falls. This information is not intended to replace advice given to you by your health care provider. Make sure you discuss any questions you have with your health care provider. Document Released: 04/06/2009 Document Revised: 11/16/2015 Document Reviewed: 07/15/2014 Elsevier Interactive Patient Education  2017 [employer].

## 2022-08-14 NOTE — Progress Notes (Signed)
Subjective:   [name] is a 72 y.o. female who presents for Medicare Annual (Subsequent) preventive examination.  I connected with  [name] on 08/14/22 by a audio enabled telemedicine application and verified that I am speaking with the correct person using two identifiers.  Patient Location: Home  Provider Location: Office/Clinic  I discussed the limitations of evaluation and management by telemedicine. The patient expressed understanding and agreed to proceed.   Review of Systems    Defer to PCP Cardiac Risk Factors include: advanced age (>66mn, >>75women);dyslipidemia;diabetes mellitus;obesity (BMI >30kg/m2);hypertension     Objective:    There were no vitals filed for this visit. There is no height or weight on file to calculate BMI.     08/14/2022    3:01 PM 07/22/2022   10:28 AM 06/11/2022   12:39 PM 05/21/2022    9:35 AM 04/30/2022    1:21 PM 04/24/2022    3:56 PM 08/13/2021    9:44 AM  Advanced Directives  Does Patient Have a Medical Advance Directive? Yes Yes Yes Yes Yes No Yes  Type of Advance Directive Living will [place] will  [place] will  [place] will  Does patient want to make changes to medical advance directive?    No - Patient declined No - Patient declined    Copy of [place] Chart?    No - copy requested No - copy requested  No - copy requested  Would patient like information on creating a medical advance directive?    No - Patient declined No - Patient declined No - Patient declined     Current Medications (verified) Outpatient Encounter Medications as of 08/14/2022  Medication Sig   Accu-Chek Softclix Lancets lancets Use to check blood glucose up to twice a day (Dx: Type 2 DM with long term insulin use E11.57, Z79.4)   acetaminophen (TYLENOL) 500 MG tablet Take 1,000 mg by mouth 2 (two)  times daily as needed for moderate pain or headache.   albuterol (VENTOLIN HFA) 108 (90 Base) MCG/ACT inhaler Inhale 2 puffs every 4-6 hours as needed - rescue.   anastrozole (ARIMIDEX) 1 MG tablet Take 1 tablet (1 mg total) by mouth daily.   aspirin EC 81 MG tablet Take 81 mg by mouth daily. Swallow whole.   blood glucose meter kit and supplies Dispense based on patient and insurance preference. Use up to four times daily as directed. (FOR ICD-10 E10.9, E11.9).   budesonide-formoterol (SYMBICORT) 160-4.5 MCG/ACT inhaler Inhale 2 puffs into the lungs 2 (two) times daily. As needed   busPIRone (BUSPAR) 7.5 MG tablet Take 1 tablet (7.5 mg total) by mouth 3 (three) times daily. As needed for anxiety   carvedilol (COREG) 25 MG tablet Take 1 tablet (25 mg total) by mouth 2 (two) times daily with a meal.   diltiazem (CARDIZEM CD) 120 MG 24 hr capsule TAKE ONE CAPSULE BY MOUTH DAILY   escitalopram (LEXAPRO) 20 MG tablet TAKE ONE TABLET BY MOUTH EVERY MORNING AND ONE-HALF TABLET EVERY EVENING   esomeprazole (NEXIUM) 40 MG capsule Take 1 capsule (40 mg total) by mouth daily.   ezetimibe (ZETIA) 10 MG tablet Take 1 tablet (10 mg total) by mouth daily.   fenofibrate (TRICOR) 145 MG tablet TAKE ONE TABLET ONCE DAILY   Ferrous Fumarate-Folic Acid (HEMOCYTE-F) 324-1 MG TABS 1 tablet po daily   fluticasone (FLONASE) 50 MCG/ACT nasal  spray Place 2 sprays into both nostrils daily.   furosemide (LASIX) 20 MG tablet TAKE ONE TABLET ONCE DAILY   glucose blood (ACCU-CHEK AVIVA) test strip Twice daily (please use brand that patient insurance covers)   insulin degludec (TRESIBA FLEXTOUCH) 200 UNIT/ML FlexTouch Pen Inject 12-20 Units into the skin daily. (Patient taking differently: Inject 12 Units into the skin daily.)   Insulin Pen Needle 32G X 4 MM MISC Use 1x a day   levothyroxine (SYNTHROID) 200 MCG tablet Take 1 tablet (200 mcg total) by mouth daily. Along with 66mg tablet for a total daily dose of 2564m.    levothyroxine (SYNTHROID) 50 MCG tablet TAKE ONE TABLET ONCE DAILY   loratadine (CLARITIN) 10 MG tablet Take 1 tablet (10 mg total) by mouth daily.   losartan (COZAAR) 100 MG tablet TAKE ONE TABLET DAILY   metFORMIN (GLUCOPHAGE-XR) 500 MG 24 hr tablet Take 4 tablets (2,000 mg total) by mouth daily with supper.   montelukast (SINGULAIR) 10 MG tablet TAKE ONE TABLET AT BEDTIME   rosuvastatin (CRESTOR) 40 MG tablet TAKE ONE TABLET DAILY   Semaglutide, 1 MG/DOSE, (OZEMPIC, 1 MG/DOSE,) 4 MG/3ML SOPN Inject 1 mg into the skin once a week.   Vitamin D, Ergocalciferol, (DRISDOL) 1.25 MG (50000 UNIT) CAPS capsule Take 1 capsule (50,000 Units total) by mouth every 7 (seven) days.   No facility-administered encounter medications on file as of 08/14/2022.    Allergies (verified) Oxycodone   History: Past Medical History:  Diagnosis Date   Anemia 04/05/2012   Anxiety    Anxiety state 09/11/2007   Qualifier: Diagnosis of  By: ToSherren MochaN, [facility]   Asthma    Atrial tachycardia, paroxysmal    Back pain 10/02/2014   Breast cancer ([place]8/27/2018   CAD (coronary artery disease), native coronary artery    remote Cath with nonobstructive ASCAD with 20% mid LAD, 20% prox left circ and 20% ostial RCA   Chicken pox as a child   Chronic diastolic CHF (congestive heart failure), NYHA class 1 ([facility]   Complication of anesthesia    pt states feels different in her body after anesthesia when waking up and also experiences a smell of burnt plastic for approx a wk    Constipation    Depression    Diabetes ([place]   Dilated aortic root ([place]   4272my echo 08/2020   Dysuria 02/17/2017   Elevated LFTs 04/05/2012   GERD (gastroesophageal reflux disease)    Goiter    Heart murmur    hx of one at birth    History of kidney stones    History of radiation therapy 10/31/16-12/17/16   left breast 45 Gy in 25 fractions, left breast boost 16 Gy in 8 fractions   Hx of colonic polyps    Hyperlipidemia    Hypertension     Insomnia 10/08/2016   Joint pain    Malignant neoplasm of upper-outer quadrant of left female breast (HCCRock[place]1/21/2017   not with patient   Measles as a child   Mumps as a child   OA (osteoarthritis) of knee 04/05/2012   Obesity 07/11/2014   PONV (postoperative nausea and vomiting)    Preventative health care 09/05/2013   PVC's (premature ventricular contractions) 05/02/2016   Shortness of breath dyspnea    walking distances / climbing stairs   Sleep apnea 04/05/2012   Tinea corporis 02/23/2013   Type 2 diabetes mellitus with hyperglycemia ([place]/03/2014   Vasomotor  rhinitis 04/05/2012   Ventral hernia    Wheezing    Past Surgical History:  Procedure Laterality Date   BREAST LUMPECTOMY Left 2017   BREAST LUMPECTOMY WITH RADIOACTIVE SEED AND SENTINEL LYMPH NODE BIOPSY Left 06/21/2016   Procedure: LEFT BREAST LUMPECTOMY WITH RADIOACTIVE SEED AND SENTINEL LYMPH NODE BIOPSY, INJECT BLUE DYE LEFT BREAST;  Surgeon: Fanny Skates, MD;  Location: [place];  Service: General;  Laterality: Left;   CARDIAC CATHETERIZATION     normal coroary arteries per patient   CARDIOVASCULAR STRESS TEST     10/12/2013   CESAREAN SECTION     X 3   CHOLECYSTECTOMY     COLONOSCOPY WITH PROPOFOL N/A 03/28/2015   Procedure: COLONOSCOPY WITH PROPOFOL;  Surgeon: Juanita Craver, MD;  Location: [facility];  Service: Endoscopy;  Laterality: N/A;   HERNIA REPAIR  02/08/2011   ventral hernia   JOINT REPLACEMENT     bilateral   KNEE ARTHROSCOPY  05/2010   bilateral   LEFT AND RIGHT HEART CATHETERIZATION WITH CORONARY ANGIOGRAM N/A 11/08/2013   Procedure: LEFT AND RIGHT HEART CATHETERIZATION WITH CORONARY ANGIOGRAM;  Surgeon: [name], MD;  Location[facility] CATH LAB;  Service: Cardiovascular;  Laterality: N/A;   MENISCUS REPAIR  2009   MOUTH SURGERY     teeth implants   PORT-A-CATH REMOVAL N/A 01/24/2017   Procedure: REMOVAL PORT-A-CATH;  Surgeon: Fanny Skates, MD;  Location: [place];  Service: General;   Laterality: N/A;   PORTACATH PLACEMENT Right 07/16/2016   Procedure: INSERTION PORT-A-CATH RIGHT INTERNAL JUGULAR WITH ULTRASOUND;  Surgeon: Fanny Skates, MD;  Location: [place];  Service: General;  Laterality: Right;   REPAIR DURAL / CSF LEAK  2021   performed at [facility] in [place]  2011   left   TOTAL KNEE ARTHROPLASTY Right 05/10/2014   Procedure: RIGHT TOTAL KNEE ARTHROPLASTY;  Surgeon: Mauri Pole, MD;  Location: [facility];  Service: Orthopedics;  Laterality: Right;   TOTAL THYROIDECTOMY Bilateral 2022   TUBAL LIGATION     WISDOM TOOTH EXTRACTION  2000   Family History  Problem Relation Age of Onset   Thyroid disease Mother        hypothyroid   Hyperlipidemia Mother    Depression Mother    Anxiety disorder Mother    Heart attack Father 30   Hypertension Father    Arthritis Father        RA   Coronary artery disease Father    High Cholesterol Father    Coronary artery disease Brother    Heart disease Brother    Cancer Maternal Grandmother        colon   Heart attack Maternal Grandfather    Alcohol abuse Paternal Grandfather    Cancer Maternal Aunt        colon   Social History   Socioeconomic History   Marital status: Divorced    Spouse name: Not on file   Number of children: 3   Years of education: Not on file   Highest education level: Not on file  Occupational History   Occupation: retired -   Tobacco Use   Smoking status: Never   Smokeless tobacco: Never  Vaping Use   Vaping Use: Never used  Substance and Sexual Activity   Alcohol use: Not Currently    Comment: rarely   Drug use: No   Sexual activity: Never  Other Topics Concern   Not on file  Social History  Narrative   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)   [place] Strain: [place]  (08/14/2022)   Overall Financial Resource Strain (CARDIA)    Difficulty of Paying Living Expenses: Not  very hard  Food Insecurity: No Food Insecurity (08/14/2022)   Hunger Vital Sign    Worried About Running Out of Food in the Last Year: Never true    Ran Out of Food in the Last Year: Never true  Transportation Needs: No Transportation Needs (08/14/2022)   PRAPARE - Hydrologist (Medical): No    Lack of Transportation (Non-Medical): No  Physical Activity: Insufficiently Active (08/14/2022)   Exercise Vital Sign    Days of Exercise per Week: 1 day    Minutes of Exercise per Session: 20 min  Stress: No Stress Concern Present (08/14/2022)   [place]    Feeling of Stress : Only a little  Social Connections: Unknown (08/14/2022)   Social Connection and Isolation Panel [NHANES]    Frequency of Communication with Friends and Family: More than three times a week    Frequency of Social Gatherings with Friends and Family: Once a week    Attends Religious Services: Not on Advertising copywriter or Organizations: Yes    Attends Music therapist: More than 4 times per year    Marital Status: Divorced    Tobacco Counseling Counseling given: Not Answered   Clinical Intake:  Pre-visit preparation completed: Yes  Pain : No/denies pain  Nutritional Risks: None Diabetes: Yes CBG done?: No Did pt. bring in CBG monitor from home?: No  How often do you need to have someone help you when you read instructions, pamphlets, or other written materials from your doctor or pharmacy?: 3 - Sometimes   Activities of Daily Living    08/14/2022    9:05 AM  In your present state of health, do you have any difficulty performing the following activities:  Hearing? 1  Vision? 1  Difficulty concentrating or making decisions? 0  Walking or climbing stairs? 1  Dressing or bathing? 0  Doing errands, shopping? 0  Preparing Food and eating ? N  Using the Toilet? N  In the past six months, have  you accidently leaked urine? Y  Do you have problems with loss of bowel control? N  Managing your Medications? N  Managing your Finances? N  Housekeeping or managing your Housekeeping? N    Patient Care Team: [name], MD as PCP - General (Family Medicine) Sueanne Margarita, MD as PCP - Cardiology (Cardiology) Fanny Skates, MD as Consulting Physician (General Surgery) Nicholas Lose, MD as Consulting Physician (Hematology and Oncology) Gery Pray, MD as Consulting Physician (Radiation Oncology) [name], NP as Nurse Practitioner (Hematology and Oncology) Cherre Robins, [place] (Pharmacist)  Indicate any recent Medical Services you may have received from other than [facility] providers in the past year (date may be approximate).     Assessment:   This is a routine wellness examination for [name].  Hearing/Vision screen No results found.  Dietary issues and exercise activities discussed: Current Exercise Habits: The patient does not participate in regular exercise at present, Exercise limited by: None identified   Goals Addressed   None    Depression Screen    08/14/2022    3:04 PM 04/29/2022    9:43 AM 01/22/2022  9:07 AM 08/13/2021    9:46 AM 08/10/2021    2:49 PM 03/20/2021   10:31 AM 08/28/2020    3:55 PM  PHQ 2/9 Scores  PHQ - 2 Score 0 0 2 0 0 0 1  PHQ- 9 Score   2        Fall Risk    08/14/2022    9:05 AM 01/22/2022    9:07 AM 08/13/2021    9:46 AM 08/10/2021   11:21 AM 03/20/2021   10:31 AM  Fall Risk   Falls in the past year? 1 1 1 1 1  $ Number falls in past yr: 1 0 0 1 1  Injury with Fall? 0 1 0 0 0  Risk for fall due to : History of fall(s)   History of fall(s);Impaired balance/gait;Impaired mobility   Follow up Falls evaluation completed  Falls prevention discussed Falls prevention discussed;Education provided;Falls evaluation completed     FALL RISK PREVENTION PERTAINING TO THE HOME:  Any stairs in or around the home? No  Home free of  loose throw rugs in walkways, pet beds, electrical cords, etc? Yes  Adequate lighting in your home to reduce risk of falls? Yes   ASSISTIVE DEVICES UTILIZED TO PREVENT FALLS:  Life alert? No  Use of a cane, walker or w/c? No  Grab bars in the bathroom? Yes  Shower chair or bench in shower? Yes  Elevated toilet seat or a handicapped toilet? Yes   TIMED UP AND GO:  Was the test performed?  No, audio visit .    Cognitive Function:    04/22/2017    9:24 AM  MMSE - Mini Mental State Exam  Orientation to time 5  Orientation to Place 5  Registration 3  Attention/ Calculation 5  Recall 2  Language- name 2 objects 2  Language- repeat 1  Language- follow 3 step command 3  Language- read & follow direction 1  Write a sentence 1  Copy design 1  Total score 29        08/14/2022    3:09 PM  6CIT Screen  What Year? 0 points  What month? 0 points  What time? 0 points  Count back from 20 0 points  Months in reverse 0 points  Repeat phrase 0 points  Total Score 0 points    Immunizations Immunization History  Administered Date(s) Administered   Fluad Quad(high Dose 65+) 04/21/2019, 03/20/2021   Influenza Split 03/24/2012, 03/24/2013   Influenza, High Dose Seasonal PF 04/18/2016, 04/21/2017, 03/24/2018, 04/27/2020   Influenza,inj,Quad PF,6+ Mos 06/21/2022   Influenza-Unspecified 03/21/2014, 03/15/2015, 03/18/2015   Moderna Covid-19 Vaccine Bivalent Booster 24yr & up 05/07/2021   Moderna SARS-COV2 Booster Vaccination 12/06/2020   Moderna Sars-Covid-2 Vaccination 07/29/2019, 08/27/2019, 04/19/2020   Pneumococcal Conjugate-13 04/18/2016   Pneumococcal Polysaccharide-23 05/22/2017   Tdap 10/02/2021   Zoster Recombinat (Shingrix) 04/21/2019, 06/28/2019   Zoster, Live 04/19/2011    TDAP status: Up to date  Flu Vaccine status: Up to date  Pneumococcal vaccine status: Up to date  Covid-19 vaccine status: Information provided on how to obtain vaccines.   Qualifies for  Shingles Vaccine? Yes   Zostavax completed Yes   Shingrix Completed?: Yes  Screening Tests Health Maintenance  Topic Date Due   Diabetic kidney evaluation - Urine ACR  Never done   COVID-19 Vaccine (5 - 2023-24 season) 02/22/2022   COLONOSCOPY (Pts 45-464yrInsurance coverage will need to be confirmed)  03/27/2022   Medicare Annual Wellness (AWV)  08/13/2022  OPHTHALMOLOGY EXAM  10/25/2022   HEMOGLOBIN A1C  12/20/2022   FOOT EXAM  02/15/2023   Diabetic kidney evaluation - eGFR measurement  04/30/2023   MAMMOGRAM  09/13/2023   DTaP/Tdap/Td (2 - Td or Tdap) 10/03/2031   Pneumonia Vaccine 76+ Years old  Completed   INFLUENZA VACCINE  Completed   DEXA SCAN  Completed   Hepatitis C Screening  Completed   Zoster Vaccines- Shingrix  Completed   HPV VACCINES  Aged Out    Health Maintenance  Health Maintenance Due  Topic Date Due   Diabetic kidney evaluation - Urine ACR  Never done   COVID-19 Vaccine (5 - 2023-24 season) 02/22/2022   COLONOSCOPY (Pts 45-64yr Insurance coverage will need to be confirmed)  03/27/2022   Medicare Annual Wellness (AWV)  08/13/2022    Colorectal cancer screening: Type of screening: Colonoscopy. Completed 03/28/15. Repeat every 7 years  Mammogram status: Completed 09/12/21. Repeat every year  Bone Density status: Ordered 08/14/22. Pt provided with contact info and advised to call to schedule appt.  Lung Cancer Screening: (Low Dose CT Chest recommended if Age 53109-80years, 30 pack-year currently smoking OR have quit w/in 15years.) does not qualify.   Additional Screening:  Hepatitis C Screening: does qualify; Completed 09/02/12  Vision Screening: Recommended annual ophthalmology exams for early detection of glaucoma and other disorders of the eye. Is the patient up to date with their annual eye exam?  Yes  Who is the provider or what is the name of the office in which the patient attends annual eye exams? Dr. Heather OSyrian Arab RepublicIf pt is not established with a  provider, would they like to be referred to a provider to establish care? No .   Dental Screening: Recommended annual dental exams for proper oral hygiene  Community Resource Referral / Chronic Care Management: CRR required this visit?  No   CCM required this visit?  No      Plan:     I have personally reviewed and noted the following in the patient's chart:   Medical and social history Use of alcohol, tobacco or illicit drugs  Current medications and supplements including opioid prescriptions. Patient is not currently taking opioid prescriptions. Functional ability and status Nutritional status Physical activity Advanced directives List of other physicians Hospitalizations, surgeries, and ER visits in previous 12 months Vitals Screenings to include cognitive, depression, and falls Referrals and appointments  In addition, I have reviewed and discussed with patient certain preventive protocols, quality metrics, and best practice recommendations. A written personalized care plan for preventive services as well as general preventive health recommendations were provided to patient.   Due to this being a telephonic visit, the after visit summary with patients personalized plan was offered to patient via mail or my-chart. Patient would like to access on my-chart.  BBeatris Ship [place]  08/14/2022   Nurse Notes: None

## 2022-08-16 ENCOUNTER — Other Ambulatory Visit: Payer: Self-pay | Admitting: Family

## 2022-08-16 ENCOUNTER — Other Ambulatory Visit: Payer: Self-pay | Admitting: Family Medicine

## 2022-08-16 DIAGNOSIS — E559 Vitamin D deficiency, unspecified: Secondary | ICD-10-CM

## 2022-08-20 ENCOUNTER — Telehealth: Payer: Self-pay | Admitting: Internal Medicine

## 2022-08-20 NOTE — Telephone Encounter (Signed)
Dr. Annamaria Boots please advise on patients sleep study results

## 2022-08-20 NOTE — Telephone Encounter (Signed)
Home Sleep Test showed severe obstructive apnea, off CPAP, averaging 42 apneas/ hour with low oxygen score. She should continue using CPAP and keep schedule office follow-up on April 29. We will need download.

## 2022-08-20 NOTE — Telephone Encounter (Signed)
Went over patients cpap machine. She verbalized understanding. Nothing further needed.

## 2022-08-20 NOTE — Telephone Encounter (Signed)
Patient is calling to get the results of her sleep test.  Please call to discuss at [phone]

## 2022-08-21 ENCOUNTER — Ambulatory Visit (INDEPENDENT_AMBULATORY_CARE_PROVIDER_SITE_OTHER): Payer: Medicare Other | Admitting: Pharmacist

## 2022-08-21 DIAGNOSIS — Z794 Long term (current) use of insulin: Secondary | ICD-10-CM

## 2022-08-21 DIAGNOSIS — I10 Essential (primary) hypertension: Secondary | ICD-10-CM

## 2022-08-21 DIAGNOSIS — I2510 Atherosclerotic heart disease of native coronary artery without angina pectoris: Secondary | ICD-10-CM

## 2022-08-21 DIAGNOSIS — E1165 Type 2 diabetes mellitus with hyperglycemia: Secondary | ICD-10-CM

## 2022-08-21 MED ORDER — FREESTYLE LIBRE 3 SENSOR MISC: 5 refills | Status: DC

## 2022-08-21 NOTE — Progress Notes (Signed)
Pharmacy Note  08/21/2022 Name: [name] MRN: [id number] DOB: January 12, 1951  Subjective: [name] is a 72 y.o. year old female who is a primary care patient of [name], MD. Clinical Pharmacist Practitioner referral was placed to assist with diabetes / medication management.    Engaged with patient by telephone for follow up visit today.  Type 2 DM:  Current therapy: Ozempic '1mg'$  weekly, Tresiba 12 units daily and metformin ER '500mg'$  4 tablets daily. We have applied for medication assistance program for [place] and Ozempic for 2024. Patient states she received a letter that they needed additional information Patient is checking blood glucose occasionally but not daily. Reports blood glucose usually 100 to 140's; She is interested in Continuous Glucose Monitor.   Anxiety:  Current therapy - buspirone twice daily and escitalopram '20mg'$  daily Has not taken buspirone in a few week because anxiety has better but she has recently been worried about her [place] not being accepted by her doctor at [facility].  Medication Management - reviewed med list and refill history.  Patient has not been taking ferrous fumarate. She did retry it in December 2023 at the request of hematology clinic but she stopped after about 2 weeks because it was causing bloating and constipation. She had iron infusion 05/02/2022. Last ferritin, iron and Sat ratios was improved and in normal range.   Hypertension:  Checking blood pressure at home about 3 or 4 times per week.  SBP 128 to 140 and DBP 70 to 85 Taking diltiazem '120mg'$  daily, furosemide '20mg'$  daily, losartan '100mg'$  daily and carvedilol '25mg'$  twice a day.   Hyperlipidemia:  Current therpay: rosuvastatin '40mg'$  daily with ezetimibe '10mg'$  daily.  Last LDL was 52 but triglycerides were above goal at 227 and HDLC was low at 31. Patient has been following a plant based diet and has lost a significant amount of weight since her last lipid  panel in July 2023.   Objective: Review of patient status, including review of consultants reports, laboratory and other test data, was performed as part of comprehensive evaluation and provision of chronic care management services.   Lab Results  Component Value Date   CREATININE 0.54 04/29/2022   CREATININE 0.65 04/24/2022   CREATININE 0.82 01/22/2022    Lab Results  Component Value Date   HGBA1C 5.8 (A) 06/20/2022       Component Value Date/Time   CHOL 120 01/15/2022 0753   TRIG 227 (H) 01/15/2022 0753   HDL 31 (L) 01/15/2022 0753   CHOLHDL 3.9 01/15/2022 0753   CHOLHDL 3 07/16/2021 1213   VLDL 42.4 (H) 07/16/2021 1213   LDLCALC 52 01/15/2022 0753   LDLCALC 67 06/02/2020 1427   LDLDIRECT 56.0 07/16/2021 1213     Clinical ASCVD: Yes  The ASCVD Risk score (Arnett DK, et al., 2019) failed to calculate for the following reasons:   The valid total cholesterol range is 130 to 320 mg/dL    BP Readings from Last 3 Encounters:  07/22/22 (!) 147/86  06/21/22 126/86  06/20/22 (!) 132/92     Allergies  Allergen Reactions   Oxycodone Other (See Comments)    Does not feel good. Makes her sleepy.    Medications Reviewed Today     Reviewed by Cherre Robins, RPH-CPP (Pharmacist) on 08/21/22 at 32  Med List Status: <None>   Medication Order Taking? Sig Documenting Provider Last Dose Status Informant  Accu-Chek Softclix Lancets lancets [id number] Yes Use to check blood  glucose up to twice a day (Dx: Type 2 DM with long term insulin use E11.57, Z79.4) [name], MD Taking Active   acetaminophen (TYLENOL) 500 MG tablet [id number] Yes Take 1,000 mg by mouth 2 (two) times daily as needed for moderate pain or headache. [provider] Taking Active Self  albuterol (VENTOLIN HFA) 108 (90 Base) MCG/ACT inhaler [id number] Yes Inhale 2 puffs every 4-6 hours as needed - rescue. Deneise Lever, MD Taking Active   anastrozole (ARIMIDEX) 1 MG tablet [id number] Yes Take 1  tablet (1 mg total) by mouth daily. Nicholas Lose, MD Taking Active   aspirin EC 81 MG tablet [id number] Yes Take 81 mg by mouth daily. Swallow whole. [provider] Taking Active   blood glucose meter kit and supplies [id number] Yes Dispense based on patient and insurance preference. Use up to four times daily as directed. (FOR ICD-10 E10.9, E11.9). [name], MD Taking Active   budesonide-formoterol [facility]) 160-4.5 MCG/ACT inhaler [id number]  Inhale 2 puffs into the lungs 2 (two) times daily. As needed [provider]  Active            Med Note Antony Contras, [place] B   Fri Nov 30, 2021  4:14 PM) Approved to receive from [facility] and me program thru 06/23/2022  busPIRone (BUSPAR) 7.5 MG tablet [id number] Yes Take 1 tablet (7.5 mg total) by mouth 3 (three) times daily. As needed for anxiety Debbrah Alar, NP Taking Active   carvedilol (COREG) 25 MG tablet [id number] Yes Take 1 tablet (25 mg total) by mouth 2 (two) times daily with a meal. [name], MD Taking Active   diltiazem (CARDIZEM CD) 120 MG 24 hr capsule [id number] Yes TAKE ONE CAPSULE BY MOUTH DAILY Debbrah Alar, NP Taking Active   escitalopram (LEXAPRO) 20 MG tablet [id number] Yes TAKE ONE TABLET BY MOUTH EVERY MORNING AND ONE-HALF TABLET EVERY Rocky Morel, NP Taking Active   esomeprazole (NEXIUM) 40 MG capsule [id number] Yes TAKE ONE CAPSULE ONCE DAILY [name], MD Taking Active   ezetimibe (ZETIA) 10 MG tablet [id number] Yes TAKE ONE TABLET ONCE DAILY [name], MD Taking Active   fenofibrate (TRICOR) 145 MG tablet [id number] Yes TAKE ONE TABLET ONCE DAILY [name], MD Taking Active   Ferrous Fumarate-Folic Acid (HEMOCYTE-F) 324-1 MG TABS [id number] No 1 tablet po daily  Patient not taking: Reported on 08/21/2022   [name], MD Not Taking Active            Med Note Antony Contras, [place] B   Wed Jul 10, 2022  2:13 PM) Stopped because caused bloating and constipation  fluticasone  (FLONASE) 50 MCG/ACT nasal spray [id number] Yes Place 2 sprays into both nostrils daily. [name], MD Taking Active   furosemide (LASIX) 20 MG tablet [id number] Yes TAKE ONE TABLET ONCE DAILY [name], MD Taking Active   glucose blood (ACCU-CHEK AVIVA) test strip [id number] Yes Twice daily (please use brand that patient insurance covers) [name], MD Taking Active   insulin degludec (TRESIBA FLEXTOUCH) 200 UNIT/ML FlexTouch Pen [id number] Yes Inject 12-20 Units into the skin daily.  Patient taking differently: Inject 12 Units into the skin daily.   Philemon Kingdom, MD Taking Active   Insulin Pen Needle 32G X 4 MM MISC [id number] Yes Use 1x a day Philemon Kingdom, MD Taking Active   levothyroxine (SYNTHROID) 200 MCG tablet [id number] Yes Take 1 tablet (200 mcg total) by mouth daily. Along with 30mg  tablet for a total daily dose of 265mg. BMosie Lukes MD Taking Active   levothyroxine (SYNTHROID) 50 MCG tablet [id number] TAKE ONE TABLET ONCE DAILY BMosie Lukes MD Taking Active   loratadine (CLARITIN) 10 MG tablet [id number] Take 1 tablet (10 mg total) by mouth daily. BMosie Lukes MD Taking Active   losartan (COZAAR) 100 MG tablet [id number] TAKE ONE TABLET DAILY ODebbrah Alar NP Taking Active   metFORMIN (GLUCOPHAGE-XR) 500 MG 24 hr tablet [id number] Take 4 tablets (2,000 mg total) by mouth daily with supper. GPhilemon Kingdom MD Taking Active   montelukast (SINGULAIR) 10 MG tablet [id number] TAKE ONE TABLET AT BEDTIME BMosie Lukes MD Taking Active   rosuvastatin (CRESTOR) 40 MG tablet [id number] TAKE ONE TABLET DAILY BMosie Lukes MD Taking Active   Semaglutide, 1 MG/DOSE, (OZEMPIC, 1 MG/DOSE,) 4 MG/3ML SOPN [id number] Inject 1 mg into the skin once a week. GPhilemon Kingdom MD Taking Active            Med Note ([facility] [place]   Fri Nov 30, 2021  4:15 PM) Approved to received from [employer]6/9/23 thru 05/23/2022  Vitamin D,  Ergocalciferol, (DRISDOL) 1.25 MG (50000 UNIT) CAPS capsule [id number] Take 1 capsule (50,000 Units total) by mouth every 7 (seven) days. BMosie Lukes MD Taking Active             Patient Active Problem List   Diagnosis Date Noted   Asthma 06/11/2022   IDA (iron deficiency anemia) 04/30/2022   Lower GI bleed 04/29/2022   Allergic rhinitis 11/09/2021   Hx of papillary thyroid carcinoma 07/19/2021   Poorly controlled type 2 diabetes mellitus with circulatory disorder ([facility] 07/19/2021   Atypical chest pain 07/16/2021   Urinary incontinence 07/16/2021   Hypothyroidism 03/20/2021   Nausea 12/06/2020   Muscle cramps 12/06/2020   H/O thyroidectomy 08/30/2020   CSF leak from nose 01/13/2020   Uncontrolled type 2 diabetes mellitus with hyperglycemia ([place] 04/14/2018   Vitamin D deficiency 03/30/2018   Dental infection 03/29/2018   Dysphagia 12/28/2017   Cough 12/28/2017   Peripheral neuropathy 09/22/2017   Lipoma 09/22/2017   Lymphedema 05/22/2017   Asthmatic bronchitis, mild intermittent, uncomplicated [id number]  Breast cancer ([facility] 02/17/2017   Dysuria 02/17/2017   Insomnia 10/08/2016   Dilated aortic root ([facility]    Port catheter in place 08/01/2016   Malignant neoplasm of upper-outer quadrant of left female breast ([facility] 05/14/2016   PVC's (premature ventricular contractions) 05/02/2016   Neck mass 04/21/2016   LVH (left ventricular hypertrophy) due to hypertensive disease, with heart failure ([place] 04/18/2016   Tonsillar hypertrophy 07/07/2015   Back pain 10/02/2014   Obesity 07/11/2014   S/P right TKA 05/10/2014   S/P knee replacement 05/10/2014   Atrial tachycardia, paroxysmal 02/22/2014   Type 2 diabetes mellitus with hyperglycemia (HCC) 12/31/2013   Chronic diastolic CHF (congestive heart failure) (HSp[place] 11/22/2013   CAD (coronary artery disease), native coronary artery 11/22/2013   Dyspnea on exertion 11/03/2013   Undiagnosed cardiac murmurs 09/05/2013    Preventative health care 09/05/2013   Musculoskeletal pain 11/10/2012   Vasomotor rhinitis 04/05/2012   Obstructive sleep apnea 04/05/2012   Anemia 04/05/2012   Elevated LFTs 04/05/2012   OA (osteoarthritis) of knee 04/05/2012   Hernia 10/13/2010   Hyperlipidemia 09/11/2007   Anxiety and depression 09/11/2007   Essential hypertension 09/11/2007   GERD 09/11/2007   RENAL CALCULUS 09/11/2007   RENAL CYST 09/11/2007  COLONIC POLYPS, HX OF 09/11/2007     Medication Assistance:  Application for Ozmepic and Tresiba thru [employer]   medication assistance program. in process.  Anticipated assistance start date 09/23/2022.  See plan of care for additional detail.   Assessment / Plan: Type 2 DM - controlled.  Continue Tresiba 12 units daily, Ozempic '1mg'$  weekly and metformin ER '500mg'$  4 tabs daily.  Sent in Rx for [employer] 3 Continuous Glucose Monitor to see if covered by insurance.  It was covered. Patient to come tomorrow for DEXA and will provide Continuous Glucose Monitor education.  Reviewed blood glucose goals  Anxiety - increased recently  Continue escitalopram daily.  Restart buspirone 7.'5mg'$  2 to 3 times a day  Hypertension: at goal Continue diltiazem '120mg'$  daily, furosemide '20mg'$  daily, losartan '100mg'$  daily and carvedilol '25mg'$  twice a day.  Continue to check blood pressure at home 3 to 4 days per week.  Reviewed blood pressure goal < 130/80 due to type 2 DM  Contacted [employer] to discussed needed information for patient assistance program application - they need [id number] or social security benefits information. ALso need updated copy of patients insurance card. Patient aware and will bring in needed information tomorrow when she is in the office for Continuous Glucose Monitor education.  Hyperlipidemia:  Continue rosuvastatin '40mg'$  daily with ezetimibe '10mg'$  daily.  Continue plant based diet  Due to recheck lipids at next appt with PCP   Medication Management -  reviewed med list and refill history.  Discussed adherence   Follow Up:  Telephone follow up appointment with care management team member scheduled for:  1 to 2 months   Cherre Robins, PharmD Clinical Pharmacist [place] [phone]

## 2022-08-22 ENCOUNTER — Telehealth: Payer: Self-pay | Admitting: Family Medicine

## 2022-08-22 ENCOUNTER — Ambulatory Visit (HOSPITAL_BASED_OUTPATIENT_CLINIC_OR_DEPARTMENT_OTHER)
Admission: RE | Admit: 2022-08-22 | Discharge: 2022-08-22 | Disposition: A | Discharge status: Home / Self Care | Payer: Medicare Other | Source: Ambulatory Visit | Attending: Family Medicine | Admitting: Family Medicine

## 2022-08-22 DIAGNOSIS — Z780 Asymptomatic menopausal state: Secondary | ICD-10-CM | POA: Diagnosis not present

## 2022-08-22 DIAGNOSIS — M810 Age-related osteoporosis without current pathological fracture: Secondary | ICD-10-CM | POA: Diagnosis not present

## 2022-08-22 NOTE — Telephone Encounter (Signed)
Pt dropped off document for pharmacist to have for her documents for meds. Pt has it in white envelope. Document put at front office tray under pharmacist name.

## 2022-08-23 NOTE — Telephone Encounter (Signed)
Reviewed income information dropped off (was 1099 form from 2023).  Refaxed application, along with income verification and copy of patient's 2024 insurance card to [employer]. Will also forward to Rx Assistance team so that they can follow up on application in 2 to 3 weeks.

## 2022-08-26 ENCOUNTER — Encounter: Payer: Self-pay | Admitting: Family Medicine

## 2022-08-26 ENCOUNTER — Other Ambulatory Visit: Payer: Self-pay

## 2022-08-26 ENCOUNTER — Ambulatory Visit (INDEPENDENT_AMBULATORY_CARE_PROVIDER_SITE_OTHER): Payer: Medicare Other | Admitting: Family Medicine

## 2022-08-26 VITALS — BP 138/87 | HR 71 | Temp 98.7°F | Resp 16 | Wt 276.0 lb

## 2022-08-26 DIAGNOSIS — I10 Essential (primary) hypertension: Secondary | ICD-10-CM

## 2022-08-26 MED ORDER — ALENDRONATE SODIUM 70 MG PO TABS: 70.0000 mg | ORAL_TABLET | ORAL | 4 refills | Status: DC

## 2022-08-26 NOTE — Progress Notes (Signed)
   Acute Office Visit  Subjective:     [name], female    DOB: 10-01-1950, 72 y.o.   MRN: [id number]  Chief Complaint  Patient presents with   Hypertension    Patient reports blood pressure has been up at home up to 175/106      Patient is in today for blood pressure concerns.   Reports BP was high at ENT appointment 2 weeks ago, but felt like it was related to anxiety around a procedure they were doing. States she started monitoring at home a few times and became alarmed with high readings (yesterday 175/106). States she has not had any symptoms other than brief mild headache which resolved with tylenol. Patient denies any chest pain, palpitations, dyspnea, wheezing, edema, recurrent headaches, vision changes.  She is wondering if her blood pressure machine is accurate.       ROS All review of systems negative except what is listed in the HPI      Objective:    BP 138/87 (BP Location: Right Arm, Patient Position: Sitting, Cuff Size: Large)   Pulse 71   Temp 98.7 F (37.1 C) (Oral)   Resp 16   Wt 276 lb (125.2 kg)   SpO2 98%   BMI 44.55 kg/m    Physical Exam Vitals reviewed.  Constitutional:      Appearance: Normal appearance. She is obese.  Cardiovascular:     Rate and Rhythm: Normal rate and regular rhythm.     Pulses: Normal pulses.     Heart sounds: Normal heart sounds.  Pulmonary:     Effort: Pulmonary effort is normal.     Breath sounds: Normal breath sounds.  Skin:    General: Skin is warm and dry.  Neurological:     Mental Status: She is alert and oriented to person, place, and time.  Psychiatric:        Mood and Affect: Mood normal.        Behavior: Behavior normal.        Thought Content: Thought content normal.        Judgment: Judgment normal.     No results found for any visits on 08/26/22.      Assessment & Plan:   Problem List Items Addressed This Visit       Cardiovascular and Mediastinum   Essential  hypertension - Primary  Blood pressure is stable today. No concerns. Discussed appropriate cuff size and technique for monitoring home BP. No changes today.   Helped patient apply her Freestyle Libre 3 sensor today.   No orders of the defined types were placed in this encounter.   Return if symptoms worsen or fail to improve.  Terrilyn Saver, NP

## 2022-08-30 NOTE — Telephone Encounter (Signed)
Received notification from [place] regarding approval for [place] . Patient assistance approved from 08/26/2022 to 06/24/2023  Phone: [place] [facility] Patient Advocate [phone]

## 2022-09-05 ENCOUNTER — Telehealth: Payer: Self-pay

## 2022-09-05 NOTE — Telephone Encounter (Signed)
Pt advised patient assistance delivered and ready for pick up.  Ozempic 4 boxes [place] 1 box

## 2022-09-10 ENCOUNTER — Other Ambulatory Visit: Payer: Medicare Other | Admitting: Pharmacist

## 2022-09-10 DIAGNOSIS — E1165 Type 2 diabetes mellitus with hyperglycemia: Secondary | ICD-10-CM

## 2022-09-10 NOTE — Progress Notes (Signed)
Pharmacy Note  09/10/2022 Name: [name] MRN: [id number] DOB: 1950/10/18  Subjective: [name] is a 72 y.o. year old female who is a primary care patient of [name], MD. Clinical Pharmacist Practitioner referral was placed to assist with diabetes / medication management.    Engaged with patient face to face for follow up visit today.  Type 2 DM:  Current therapy: Ozempic 1mg  weekly, Tresiba 12 units daily and metformin ER 500mg  4 tablets daily. Has been approved for medication assistance program for [place] and Ozempic for 2024. She has delivery to pick up at endocrinology office. She thought was still to be delivered her but medication assistance program for 2024 was filled out with endo office information and address.   Patient started Continuous Glucose Monitor about 14 days ago with Freestyle Libre 3. She reports she likes Continuous Glucose Monitor much better. She is due to change her sensor today and would like assistance.   Continuous Glucose Monitor report for last 14 days below:   Shows some highs after high CHO meals in the evening. But average blood glucose is good and patient was in recommended blood glucose range about 84% of time.        Anxiety:  Current therapy - buspirone twice daily and escitalopram 20mg  daily Has not taken buspirone in a few week because anxiety has improved.   Medication Management - reviewed med list and refill history.     Objective: Review of patient status, including review of consultants reports, laboratory and other test data, was performed as part of comprehensive evaluation and provision of chronic care management services.   Lab Results  Component Value Date   CREATININE 0.54 04/29/2022   CREATININE 0.65 04/24/2022   CREATININE 0.82 01/22/2022    Lab Results  Component Value Date   HGBA1C 5.8 (A) 06/20/2022       Component Value Date/Time   CHOL 120 01/15/2022 0753   TRIG 227 (H)  01/15/2022 0753   HDL 31 (L) 01/15/2022 0753   CHOLHDL 3.9 01/15/2022 0753   CHOLHDL 3 07/16/2021 1213   VLDL 42.4 (H) 07/16/2021 1213   LDLCALC 52 01/15/2022 0753   LDLCALC 67 06/02/2020 1427   LDLDIRECT 56.0 07/16/2021 1213     Clinical ASCVD: Yes  The ASCVD Risk score (Arnett DK, et al., 2019) failed to calculate for the following reasons:   The valid total cholesterol range is 130 to 320 mg/dL    BP Readings from Last 3 Encounters:  08/26/22 138/87  07/22/22 (!) 147/86  06/21/22 126/86     Allergies  Allergen Reactions   Oxycodone Other (See Comments)    Does not feel good. Makes her sleepy.    Medications Reviewed Today     Reviewed by Terrilyn Saver, NP (Nurse Practitioner) on 08/26/22 at 1701  Med List Status: <None>   Medication Order Taking? Sig Documenting Provider Last Dose Status Informant  Accu-Chek Softclix Lancets lancets [id number] No Use to check blood glucose up to twice a day (Dx: Type 2 DM with long term insulin use E11.57, Z79.4) [name], MD Taking Active   acetaminophen (TYLENOL) 500 MG tablet [id number] No Take 1,000 mg by mouth 2 (two) times daily as needed for moderate pain or headache. [provider] Taking Active Self  albuterol (VENTOLIN HFA) 108 (90 Base) MCG/ACT inhaler [id number] No Inhale 2 puffs every 4-6 hours as needed - rescue. Deneise Lever, MD Taking Active   alendronate (  FOSAMAX) 70 MG tablet [id number]  Take 1 tablet (70 mg total) by mouth once a week. Take with a full glass of water on an empty stomach. [name], MD  Active   anastrozole (ARIMIDEX) 1 MG tablet [id number] No Take 1 tablet (1 mg total) by mouth daily. Nicholas Lose, MD Taking Active   aspirin EC 81 MG tablet [id number] No Take 81 mg by mouth daily. Swallow whole. [provider] Taking Active   blood glucose meter kit and supplies [id number] No Dispense based on patient and insurance preference. Use up to four times daily as directed. (FOR  ICD-10 E10.9, E11.9). [name], MD Taking Active   budesonide-formoterol [facility]) 160-4.5 MCG/ACT inhaler [id number] No Inhale 2 puffs into the lungs 2 (two) times daily. As needed [provider] Taking Active            Med Note Antony Contras, [place] B   Fri Nov 30, 2021  4:14 PM) Approved to receive from [facility] and me program thru 06/23/2022  busPIRone (BUSPAR) 7.5 MG tablet [id number] No Take 1 tablet (7.5 mg total) by mouth 3 (three) times daily. As needed for anxiety Debbrah Alar, NP Taking Active   carvedilol (COREG) 25 MG tablet [id number] No Take 1 tablet (25 mg total) by mouth 2 (two) times daily with a meal. [name], MD Taking Active   Continuous Blood Gluc Sensor (FREESTYLE LIBRE 3 SENSOR) [place] [id number]  Place 1 sensor on the skin every 14 days. Use to check glucose continuously. [name], MD  Active   diltiazem (CARDIZEM CD) 120 MG 24 hr capsule [id number] No TAKE ONE CAPSULE BY MOUTH DAILY Debbrah Alar, NP Taking Active   escitalopram (LEXAPRO) 20 MG tablet [id number] No TAKE ONE TABLET BY MOUTH EVERY MORNING AND ONE-HALF TABLET EVERY Beatrice Lecher, Lenna Sciara, NP Taking Active   esomeprazole (NEXIUM) 40 MG capsule [id number] No TAKE ONE CAPSULE ONCE DAILY [name], MD Taking Active   ezetimibe (ZETIA) 10 MG tablet [id number] No TAKE ONE TABLET ONCE DAILY [name], MD Taking Active   fenofibrate (TRICOR) 145 MG tablet [id number] No TAKE ONE TABLET ONCE DAILY [name], MD Taking Active   Ferrous Fumarate-Folic Acid (HEMOCYTE-F) 324-1 MG TABS [id number] No 1 tablet po daily  Patient not taking: Reported on 08/21/2022   [name], MD Not Taking Active            Med Note Antony Contras, [place] B   Wed Jul 10, 2022  2:13 PM) Stopped because caused bloating and constipation  fluticasone (FLONASE) 50 MCG/ACT nasal spray [id number] No Place 2 sprays into both nostrils daily. [name], MD Taking Active   furosemide (LASIX) 20 MG tablet  [id number] No TAKE ONE TABLET ONCE DAILY [name], MD Taking Active   glucose blood (ACCU-CHEK AVIVA) test strip [id number] No Twice daily (please use brand that patient insurance covers) [name], MD Taking Active   insulin degludec (TRESIBA FLEXTOUCH) 200 UNIT/ML FlexTouch Pen [id number] No Inject 12-20 Units into the skin daily.  Patient taking differently: Inject 12 Units into the skin daily.   Philemon Kingdom, MD Taking Active   Insulin Pen Needle 32G X 4 MM MISC [id number] No Use 1x a day Philemon Kingdom, MD Taking Active   levothyroxine (SYNTHROID) 200 MCG tablet [id number] No Take 1 tablet (200 mcg total) by mouth daily. Along with 18mcg tablet for a total daily dose of 219mcg. [name], MD Taking Active  levothyroxine (SYNTHROID) 50 MCG tablet [id number] No TAKE ONE TABLET ONCE DAILY [name], MD Taking Active   loratadine (CLARITIN) 10 MG tablet [id number] No Take 1 tablet (10 mg total) by mouth daily. [name], MD Taking Active   losartan (COZAAR) 100 MG tablet [id number] No TAKE ONE TABLET DAILY Debbrah Alar, NP Taking Active   metFORMIN (GLUCOPHAGE-XR) 500 MG 24 hr tablet [id number] No Take 4 tablets (2,000 mg total) by mouth daily with supper. Philemon Kingdom, MD Taking Active   montelukast (SINGULAIR) 10 MG tablet [id number] No TAKE ONE TABLET AT BEDTIME [name], MD Taking Active   rosuvastatin (CRESTOR) 40 MG tablet [id number] No TAKE ONE TABLET DAILY [name], MD Taking Active   Semaglutide, 1 MG/DOSE, (OZEMPIC, 1 MG/DOSE,) 4 MG/3ML SOPN [id number] No Inject 1 mg into the skin once a week. Philemon Kingdom, MD Taking Active            Med Note Antony Contras, [place] B   Fri Nov 30, 2021  4:15 PM) Approved to received from [employer] 11/30/21 thru 05/23/2022  Vitamin D, Ergocalciferol, (DRISDOL) 1.25 MG (50000 UNIT) CAPS capsule [id number] No Take 1 capsule (50,000 Units total) by mouth every 7 (seven) days. [name], MD Taking Active              Patient Active Problem List   Diagnosis Date Noted   Asthma 06/11/2022   IDA (iron deficiency anemia) 04/30/2022   Lower GI bleed 04/29/2022   Allergic rhinitis 11/09/2021   Hx of papillary thyroid carcinoma 07/19/2021   Poorly controlled type 2 diabetes mellitus with circulatory disorder ([place]) 07/19/2021   Atypical chest pain 07/16/2021   Urinary incontinence 07/16/2021   Hypothyroidism 03/20/2021   Nausea 12/06/2020   Muscle cramps 12/06/2020   H/O thyroidectomy 08/30/2020   CSF leak from nose 01/13/2020   Uncontrolled type 2 diabetes mellitus with hyperglycemia ([place]) 04/14/2018   Vitamin D deficiency 03/30/2018   Dental infection 03/29/2018   Dysphagia 12/28/2017   Cough 12/28/2017   Peripheral neuropathy 09/22/2017   Lipoma 09/22/2017   Lymphedema 05/22/2017   Asthmatic bronchitis, mild intermittent, uncomplicated [id number]   Breast cancer ([place]) 02/17/2017   Dysuria 02/17/2017   Insomnia 10/08/2016   Dilated aortic root ([place])    Port catheter in place 08/01/2016   Malignant neoplasm of upper-outer quadrant of left female breast ([place]) 05/14/2016   PVC's (premature ventricular contractions) 05/02/2016   Neck mass 04/21/2016   LVH (left ventricular hypertrophy) due to hypertensive disease, with heart failure ([place]) 04/18/2016   Tonsillar hypertrophy 07/07/2015   Back pain 10/02/2014   Obesity 07/11/2014   S/P right TKA 05/10/2014   S/P knee replacement 05/10/2014   Atrial tachycardia, paroxysmal 02/22/2014   Type 2 diabetes mellitus with hyperglycemia ([place]) 12/31/2013   Chronic diastolic CHF (congestive heart failure) ([place]) 11/22/2013   CAD (coronary artery disease), native coronary artery 11/22/2013   Dyspnea on exertion 11/03/2013   Undiagnosed cardiac murmurs 09/05/2013   Preventative health care 09/05/2013   Musculoskeletal pain 11/10/2012   Vasomotor rhinitis 04/05/2012   Obstructive sleep apnea 04/05/2012   Anemia 04/05/2012   Elevated LFTs  04/05/2012   OA (osteoarthritis) of knee 04/05/2012   Hernia 10/13/2010   Hyperlipidemia 09/11/2007   Anxiety and depression 09/11/2007   Essential hypertension 09/11/2007   GERD 09/11/2007   RENAL CALCULUS 09/11/2007   RENAL CYST 09/11/2007   COLONIC POLYPS, HX OF 09/11/2007     Medication Assistance:   Ozempic  and [place] obtained through [employer]  medication assistance program.  Enrollment ends 06/24/2023   Assessment / Plan: Type 2 DM - controlled.  Continue Tresiba 12 units daily, Ozempic 1mg  weekly and metformin ER 500mg  4 tabs daily.  Continue to use Continuous Glucose Monitor - assisted patient with replacing sensor. Also connected with patient on [place] view.   Reviewed blood glucose report and goals. Discussed diet changes to help with post prandial highs.   Anxiety - improving Continue escitalopram daily.  Continue buspirone 7.5mg  2 to 3 times a day  Medication Management - reviewed med list and refill history.  Discussed adherence   Follow Up:  Telephone follow up appointment with care management team member scheduled for:  1 to 2 months   Cherre Robins, PharmD Clinical Pharmacist [employer] [place] [phone]

## 2022-09-10 NOTE — Telephone Encounter (Signed)
Patient picked up Ozempic and [place] patient assistance

## 2022-09-12 ENCOUNTER — Ambulatory Visit (HOSPITAL_COMMUNITY): Payer: Medicare Other | Attending: Cardiology

## 2022-09-12 DIAGNOSIS — E7800 Pure hypercholesterolemia, unspecified: Secondary | ICD-10-CM | POA: Insufficient documentation

## 2022-09-12 DIAGNOSIS — I4719 Other supraventricular tachycardia: Secondary | ICD-10-CM

## 2022-09-12 DIAGNOSIS — I5032 Chronic diastolic (congestive) heart failure: Secondary | ICD-10-CM | POA: Insufficient documentation

## 2022-09-12 DIAGNOSIS — I2510 Atherosclerotic heart disease of native coronary artery without angina pectoris: Secondary | ICD-10-CM | POA: Insufficient documentation

## 2022-09-12 DIAGNOSIS — I10 Essential (primary) hypertension: Secondary | ICD-10-CM | POA: Insufficient documentation

## 2022-09-12 DIAGNOSIS — I77810 Thoracic aortic ectasia: Secondary | ICD-10-CM | POA: Diagnosis not present

## 2022-09-12 LAB — ECHOCARDIOGRAM COMPLETE
Area-P 1/2: 4.96 cm2
P 1/2 time: 130 msec
S' Lateral: 3.6 cm

## 2022-09-13 ENCOUNTER — Telehealth: Payer: Self-pay

## 2022-09-13 DIAGNOSIS — I77810 Thoracic aortic ectasia: Secondary | ICD-10-CM

## 2022-09-13 NOTE — Telephone Encounter (Signed)
Spoke with patient to give ECHO results and recommendations by Dr [name].  "Normal heart muscle function and moderately thickened heart muscle due to HTN and increased stiffness of heart muscle called diastolic dysfunction, mildly dilated ascending aorta which has increased from 42 to 62mm in a year.  Please get a gated chest CTA for further evaluation or aorta."  Order placed for gated CTA of aorta.   Patient verbalized understanding and had no questions.

## 2022-09-14 ENCOUNTER — Other Ambulatory Visit: Payer: Self-pay | Admitting: Family Medicine

## 2022-09-14 DIAGNOSIS — E039 Hypothyroidism, unspecified: Secondary | ICD-10-CM

## 2022-09-16 ENCOUNTER — Ambulatory Visit
Admission: RE | Admit: 2022-09-16 | Discharge: 2022-09-16 | Disposition: A | Discharge status: Home / Self Care | Payer: Medicare Other | Source: Ambulatory Visit | Attending: Family Medicine | Admitting: Family Medicine

## 2022-09-16 DIAGNOSIS — Z1231 Encounter for screening mammogram for malignant neoplasm of breast: Secondary | ICD-10-CM | POA: Diagnosis not present

## 2022-10-07 ENCOUNTER — Other Ambulatory Visit: Payer: Self-pay | Admitting: Family

## 2022-10-07 ENCOUNTER — Telehealth: Payer: Self-pay | Admitting: Pharmacist

## 2022-10-07 ENCOUNTER — Other Ambulatory Visit: Payer: Self-pay | Admitting: Family Medicine

## 2022-10-07 DIAGNOSIS — G4733 Obstructive sleep apnea (adult) (pediatric): Secondary | ICD-10-CM | POA: Diagnosis not present

## 2022-10-07 NOTE — Telephone Encounter (Signed)
Prior authorization needed for [place] 3 sensors - received call from pharmacy regardign prior authorization.   Sent prior authorization with Cover My meds  Nitya Toomey (Key: [id number]) [id number] FreeStyle Libre 3 Sensor Created: April 15th, 2024 [id number]

## 2022-10-09 ENCOUNTER — Ambulatory Visit (HOSPITAL_COMMUNITY)
Admission: RE | Admit: 2022-10-09 | Discharge: 2022-10-09 | Disposition: A | Discharge status: Home / Self Care | Payer: Medicare Other | Source: Ambulatory Visit | Attending: Cardiology | Admitting: Cardiology

## 2022-10-09 ENCOUNTER — Telehealth: Payer: Self-pay

## 2022-10-09 DIAGNOSIS — I77810 Thoracic aortic ectasia: Secondary | ICD-10-CM | POA: Insufficient documentation

## 2022-10-09 DIAGNOSIS — I7121 Aneurysm of the ascending aorta, without rupture: Secondary | ICD-10-CM | POA: Diagnosis not present

## 2022-10-09 LAB — POCT I-STAT CREATININE: Creatinine, Ser: 0.6 mg/dL (ref 0.44–1.00)

## 2022-10-09 MED ORDER — IOHEXOL 350 MG/ML SOLN
75.0000 mL | Freq: Once | INTRAVENOUS | Status: AC | PRN
  Administered 2022-10-09: 75 mL via INTRAVENOUS

## 2022-10-09 NOTE — Telephone Encounter (Signed)
Reviewed Cr labs with patient, who verbalizes understanding of normal values.

## 2022-10-09 NOTE — Telephone Encounter (Signed)
-----   Message from Quintella Reichert, MD sent at 10/09/2022  2:46 PM EDT ----- Please let patient know that labs were normal.  Continue current medical therapy.

## 2022-10-10 ENCOUNTER — Encounter: Payer: Self-pay | Admitting: Cardiology

## 2022-10-16 ENCOUNTER — Telehealth: Payer: Self-pay

## 2022-10-16 DIAGNOSIS — I77819 Aortic ectasia, unspecified site: Secondary | ICD-10-CM

## 2022-10-16 NOTE — Telephone Encounter (Signed)
-----   Message from Asencion Gowda, LPN sent at 1/61/0960  1:50 PM EDT ----- Patient needs Repeat chest CTA in 1 year for follow-up.  Thanks ----- Message ----- From: Quintella Reichert, MD Sent: 10/10/2022   1:34 PM EDT To: [name], MD; Cv Div Ch St Triage  Stable dilated ascending aorta at 43 mm and aortic arch at 40 mm   Coronary artery calcifications are noted.  Repeat chest CTA in 1 year for follow-up

## 2022-10-16 NOTE — Telephone Encounter (Signed)
Called patient, no answer. Left detailed voice mail per DPR explaining that dilated ascending aorta is stable and patient just needs a repeat CTA in one year. Advised of coronary artery calcifications. Advised patient to call office if she had any questions or concerns.

## 2022-10-19 NOTE — Progress Notes (Unsigned)
HPI female never smoker followed for OSA, lung restriction, dyspnea on exertion, asthmatic bronchitis, complicated by chronic sinusitis, anxiety and depression, paroxysmal atrial tachycardia, CAD, breast cancer left, morbid obesity Unattended Home Sleep Test 11/05/13-AHI 68.1/hour, desaturation to 62%, body weight 320 pounds Echocardiogram 12/04/16- Nl EF Office Spirometry 02/06/17-moderate restriction of exhaled volume. FVC 2.02/61%, FEV1 1.75/69%, ratio 0.86, FEF 25-75% 2.45/113% PFT- 03/12/17-moderate obstructive airways disease with significant response to bronchodilator, normal diffusion. FVC 2.25/69%, FEV1 1.88/76%, ratio 0.84, TLC 80%, DLCO 86% PFT 05/10/21- Mild Restriction --------------------------------------------------------------------------------------------.  06/21/22- 72 year old female never smoker followed for OSA, lung restriction, dyspnea on exertion, asthmatic bronchitis, complicated by chronic sinusitis, anxiety and depression, paroxysmal atrial tachycardia, CAD, breast cancer left, morbid obesity, Goiter, CSF leak [facility] at [facility] with E[facility] f/u at [facility],  CPAP auto 5-15/Adapt -Albuterol hfa, Breo 100, singulair Download compliance- 47%, AHI/ 2.4/ hr Body weight today- 278 lbs Covid vax-5 Moderna Flu vax- today senior Had surgery for recurrent CSF leak and then hosp for unexplained GIB in [place]. Was off CPAP.Marland Kitchen Since she restarted CPAP machine has been making loud rubbing noise. She asks about updating sleep study to reassess needs, but admits she has benefited from CPAP. Estimates 40 lb loss  10/21/22- 72 year old female never smoker followed for OSA, lung restriction, dyspnea on exertion, asthmatic bronchitis, complicated by chronic sinusitis, anxiety and depression, paroxysmal atrial tachycardia, CAD, breast cancer left, morbid obesity, Goiter, CSF leak treated at [facility] with ENT f/u at [facility],  HST 07/17/22- AHI 42.6/ hr, desaturation to 78% with average 89%, body  weight 278 lbs    (we advised her to continue CPAP) CPAP auto 5-15/Adapt -Albuterol hfa, Breo 100, singulair Download compliance-  Body weight today- 281 lbs We do not have a download on her CPAP at this time.  She has had 2 CSF leaks repaired and says the surgeons "did not like" the fact that she was using CPAP.  Given her body habitus, Earnest Bailey is not an option.  I suggested we see what could be accomplished with a fitted oral appliance. Her asthmatic bronchitis has been doing very well.  She has not been needing to use her inhalers at all but has them available. CTachest aorta 09/23/22- Non-Vascular: 1. Postsurgical changes after thyroidectomy without complicating features. 2. Unchanged lingular cicatricial atelectasis. Marland Kitchen ROS-see HPI   + = Positive Constitutional:    +weight loss, night sweats, fevers, chills, fatigue, lassitude. HEENT:   + headaches, difficulty swallowing, tooth/dental problems, sore throat,       sneezing, itching, ear ache, + nasal congestion, post nasal drip, snoring CV:    chest pain, orthopnea, PND, swelling in lower extremities, anasarca,                                                  dizziness, palpitations Resp:   + shortness of breath with exertion or at rest.                productive cough,   non-productive cough, coughing up of blood.              change in color of mucus.   wheezing.   Skin:    rash or lesions. GI:  No-   heartburn, indigestion, abdominal pain, nausea, vomiting, diarrhea,  change in bowel habits, loss of appetite GU: dysuria, change in color of urine, no urgency or frequency.   flank pain. MS:   joint pain, stiffness, decreased range of motion, back pain. Neuro-     nothing unusual Psych:  change in mood or affect.  depression or anxiety.   memory loss.  OBJ- Physical Exam General- Alert, Oriented, Affect-appropriate, Distress- none acute, + morbidly obese Skin- rash-none, lesions- none, excoriation-  none Lymphadenopathy- none Head- atraumatic,             Eyes- Gross vision intact, PERRLA, conjunctivae and secretions clear            Ears- Hearing, canals-normal            Nose-  turbinate edema, no-Septal dev, mucus, polyps, erosion, perforation,            Throat- Mallampati III , mucosa clear , drainage- none, tonsils- atrophic,  stridor Neck- flexible , trachea midline , thyroid R mass identified as" goiter" , carotid no bruit Chest - symmetrical excursion , unlabored           Heart/CV- RRR , no murmur , no gallop  , no rub, nl s1 s2                           - JVD- none , edema- none, stasis changes- none, varices- none           Lung- clear,  wheeze- none, cough- none , dullness-none, rub- none           Chest wall-  Abd-  Br/ Gen/ Rectal- Not done, not indicated Extrem- cyanosis- none, clubbing, none, atrophy- none, strength- nl Neuro- grossly intact to observation

## 2022-10-21 ENCOUNTER — Encounter: Payer: Self-pay | Admitting: Internal Medicine

## 2022-10-21 ENCOUNTER — Ambulatory Visit: Payer: Medicare Other | Admitting: Internal Medicine

## 2022-10-21 VITALS — BP 134/88 | HR 71 | Ht 66.0 in | Wt 281.4 lb

## 2022-10-21 DIAGNOSIS — G4733 Obstructive sleep apnea (adult) (pediatric): Secondary | ICD-10-CM | POA: Diagnosis not present

## 2022-10-21 DIAGNOSIS — J4520 Mild intermittent asthma, uncomplicated: Secondary | ICD-10-CM

## 2022-10-21 NOTE — Assessment & Plan Note (Signed)
Mild intermittent uncomplicated.  Not currently requiring her to use her inhalers.

## 2022-10-21 NOTE — Patient Instructions (Signed)
Order- referral to Dr Mark Katz, Orthodontist  consider oral appliance for OSA 

## 2022-10-21 NOTE — Assessment & Plan Note (Signed)
If the potential complication from CPAP is recurrent CSF leak requiring surgery, then we should let her look at oral appliance as an alternative even if control is not as good.  She is not a candidate for Inspire.

## 2022-10-21 NOTE — Assessment & Plan Note (Signed)
Ongoing efforts with diet/ exercise

## 2022-10-22 ENCOUNTER — Inpatient Hospital Stay: Payer: Medicare Other

## 2022-10-22 ENCOUNTER — Inpatient Hospital Stay: Payer: Medicare Other | Admitting: Family

## 2022-10-23 NOTE — Assessment & Plan Note (Signed)
Following with oncology 

## 2022-10-23 NOTE — Assessment & Plan Note (Signed)
On Levothyroxine, continue to monitor 

## 2022-10-23 NOTE — Assessment & Plan Note (Signed)
Well controlled, no changes to meds. Encouraged heart healthy diet such as the DASH diet and exercise as tolerated.  °

## 2022-10-23 NOTE — Assessment & Plan Note (Signed)
Tolerating statin, encouraged heart healthy diet, avoid trans fats, minimize simple carbs and saturated fats. Increase exercise as tolerated 

## 2022-10-24 ENCOUNTER — Encounter: Payer: Self-pay | Admitting: Internal Medicine

## 2022-10-24 ENCOUNTER — Ambulatory Visit: Payer: Medicare Other | Admitting: Internal Medicine

## 2022-10-24 ENCOUNTER — Ambulatory Visit (INDEPENDENT_AMBULATORY_CARE_PROVIDER_SITE_OTHER): Payer: Medicare Other | Admitting: Family Medicine

## 2022-10-24 ENCOUNTER — Telehealth: Payer: Self-pay

## 2022-10-24 VITALS — BP 128/86 | HR 71 | Temp 98.1°F | Resp 16 | Ht 66.0 in | Wt 281.0 lb

## 2022-10-24 VITALS — BP 124/86 | HR 84 | Ht 66.0 in | Wt 279.6 lb

## 2022-10-24 DIAGNOSIS — R252 Cramp and spasm: Secondary | ICD-10-CM | POA: Diagnosis not present

## 2022-10-24 DIAGNOSIS — D649 Anemia, unspecified: Secondary | ICD-10-CM | POA: Diagnosis not present

## 2022-10-24 DIAGNOSIS — M810 Age-related osteoporosis without current pathological fracture: Secondary | ICD-10-CM

## 2022-10-24 DIAGNOSIS — Z7985 Long-term (current) use of injectable non-insulin antidiabetic drugs: Secondary | ICD-10-CM | POA: Diagnosis not present

## 2022-10-24 DIAGNOSIS — E7800 Pure hypercholesterolemia, unspecified: Secondary | ICD-10-CM

## 2022-10-24 DIAGNOSIS — E1165 Type 2 diabetes mellitus with hyperglycemia: Secondary | ICD-10-CM | POA: Diagnosis not present

## 2022-10-24 DIAGNOSIS — Z85850 Personal history of malignant neoplasm of thyroid: Secondary | ICD-10-CM

## 2022-10-24 DIAGNOSIS — E890 Postprocedural hypothyroidism: Secondary | ICD-10-CM

## 2022-10-24 DIAGNOSIS — Z7984 Long term (current) use of oral hypoglycemic drugs: Secondary | ICD-10-CM | POA: Diagnosis not present

## 2022-10-24 DIAGNOSIS — C50919 Malignant neoplasm of unspecified site of unspecified female breast: Secondary | ICD-10-CM

## 2022-10-24 DIAGNOSIS — E559 Vitamin D deficiency, unspecified: Secondary | ICD-10-CM | POA: Diagnosis not present

## 2022-10-24 DIAGNOSIS — I10 Essential (primary) hypertension: Secondary | ICD-10-CM | POA: Diagnosis not present

## 2022-10-24 DIAGNOSIS — Z794 Long term (current) use of insulin: Secondary | ICD-10-CM

## 2022-10-24 DIAGNOSIS — E1159 Type 2 diabetes mellitus with other circulatory complications: Secondary | ICD-10-CM | POA: Diagnosis not present

## 2022-10-24 LAB — POCT GLYCOSYLATED HEMOGLOBIN (HGB A1C): Hemoglobin A1C: 6.2 % — AB (ref 4.0–5.6)

## 2022-10-24 MED ORDER — DENOSUMAB 60 MG/ML ~~LOC~~ SOSY: 60.0000 mg | PREFILLED_SYRINGE | Freq: Once | SUBCUTANEOUS | 0 refills | Status: AC

## 2022-10-24 NOTE — Progress Notes (Signed)
Subjective:   By signing my name below, I, [name], attest that this documentation has been prepared under the direction and in the presence of [name] Canary, MD., 10/24/2022.   [name], female    DOB: 1950-12-27, 72 y.o.   MRN: [id number]  Chief Complaint  Patient presents with   Follow-up    Follow up   HPI Patient is in today for an office visit.  Hypertension Patient's hypertension is well-controlled and she continues taking Carvedilol 25 mg twice daily and Losartan 100 mg once daily. Her blood pressure and pulse are normal today.  BP Readings from Last 3 Encounters:  10/24/22 128/86  10/24/22 124/86  10/21/22 134/88   Pulse Readings from Last 3 Encounters:  10/24/22 71  10/24/22 84  10/21/22 71   Osteoporosis Patient's last DEXA scan was completed on 08/22/2022. The BMD measured at Femur Neck Left is 0.643 g/cm2 with a T-score of -2.8. This patient is considered osteoporotic according to World Health Organization [facility]) criteria. She did not tolerate Fosamax and is interested in alternate treatment to manage osteoporosis.  Weight Loss Patient complains of stubborn fat in her upper abdomen. She remains obese with a body mass index of 45.35 kg/m. She reports that she is now on a vegan diet which has reduced inflammation. She reports that she had a hernia removed several years ago, but it has returned. However, she denies associated pain or discomfort. Additionally, she continues to follow with Dr. Elvera Lennox, MD. She currently takes Metformin 500 mg four times daily and injects Ozempic 1 mg/mL once weekly which have both been tolerable. Wt Readings from Last 3 Encounters:  10/24/22 281 lb (127.5 kg)  10/24/22 279 lb 9.6 oz (126.8 kg)  10/21/22 281 lb 6.4 oz (127.6 kg)   Lab Results  Component Value Date   HGBA1C 6.2 (A) 10/24/2022   Past Medical History:  Diagnosis Date   Acquired dilation of ascending aorta and aortic root (HCC)     Ascending arctic 43 mm and aortic root 40 mm by chest CTA 09/2022   Anemia 04/05/2012   Anxiety    Anxiety state 09/11/2007   Qualifier: Diagnosis of  By: Tawanna Cooler RN, Alvino Chapel     Asthma    Atrial tachycardia, paroxysmal    Back pain 10/02/2014   Breast cancer (HCC) 02/17/2017   CAD (coronary artery disease), native coronary artery    remote Cath with nonobstructive ASCAD with 20% mid LAD, 20% prox left circ and 20% ostial RCA   Chicken pox as a child   Chronic diastolic CHF (congestive heart failure), NYHA class 1 (HCC)    Complication of anesthesia    pt states feels different in her body after anesthesia when waking up and also experiences a smell of burnt plastic for approx a wk    Constipation    Depression    Diabetes (HCC)    Dilated aortic root (HCC)    42mm by echo 08/2020   Dysuria 02/17/2017   Elevated LFTs 04/05/2012   GERD (gastroesophageal reflux disease)    Goiter    Heart murmur    hx of one at birth    History of kidney stones    History of radiation therapy 10/31/16-12/17/16   left breast 45 Gy in 25 fractions, left breast boost 16 Gy in 8 fractions   Hx of colonic polyps    Hyperlipidemia    Hypertension    Insomnia 10/08/2016   Joint pain  Malignant neoplasm of upper-outer quadrant of left female breast (HCC) 05/14/2016   not with patient   Measles as a child   Mumps as a child   OA (osteoarthritis) of knee 04/05/2012   Obesity 07/11/2014   PONV (postoperative nausea and vomiting)    Preventative health care 09/05/2013   PVC's (premature ventricular contractions) 05/02/2016   Shortness of breath dyspnea    walking distances / climbing stairs   Sleep apnea 04/05/2012   Tinea corporis 02/23/2013   Type 2 diabetes mellitus with hyperglycemia (HCC) 12/31/2013   Vasomotor rhinitis 04/05/2012   Ventral hernia    Wheezing     Past Surgical History:  Procedure Laterality Date   BREAST LUMPECTOMY Left 2017   BREAST LUMPECTOMY WITH RADIOACTIVE SEED AND  SENTINEL LYMPH NODE BIOPSY Left 06/21/2016   Procedure: LEFT BREAST LUMPECTOMY WITH RADIOACTIVE SEED AND SENTINEL LYMPH NODE BIOPSY, INJECT BLUE DYE LEFT BREAST;  Surgeon: Claud Kelp, MD;  Location: [facility] OR;  Service: General;  Laterality: Left;   CARDIAC CATHETERIZATION     normal coroary arteries per patient   CARDIOVASCULAR STRESS TEST     10/12/2013   CESAREAN SECTION     X 3   CHOLECYSTECTOMY     COLONOSCOPY WITH PROPOFOL N/A 03/28/2015   Procedure: COLONOSCOPY WITH PROPOFOL;  Surgeon: Charna Elizabeth, MD;  Location: [facility] ENDOSCOPY;  Service: Endoscopy;  Laterality: N/A;   HERNIA REPAIR  02/08/2011   ventral hernia   JOINT REPLACEMENT     bilateral   KNEE ARTHROSCOPY  05/2010   bilateral   LEFT AND RIGHT HEART CATHETERIZATION WITH CORONARY ANGIOGRAM N/A 11/08/2013   Procedure: LEFT AND RIGHT HEART CATHETERIZATION WITH CORONARY ANGIOGRAM;  Surgeon: Kathleene Hazel, MD;  Location[facility] CATH LAB;  Service: Cardiovascular;  Laterality: N/A;   MENISCUS REPAIR  2009   MOUTH SURGERY     teeth implants   PORT-A-CATH REMOVAL N/A 01/24/2017   Procedure: REMOVAL PORT-A-CATH;  Surgeon: Claud Kelp, MD;  Location: [facility] OR;  Service: General;  Laterality: N/A;   PORTACATH PLACEMENT Right 07/16/2016   Procedure: INSERTION PORT-A-CATH RIGHT INTERNAL JUGULAR WITH ULTRASOUND;  Surgeon: Claud Kelp, MD;  Location: [facility] OR;  Service: General;  Laterality: Right;   REPAIR DURAL / CSF LEAK  2021   performed at [facility] [place]   TOTAL KNEE ARTHROPLASTY  2011   left   TOTAL KNEE ARTHROPLASTY Right 05/10/2014   Procedure: RIGHT TOTAL KNEE ARTHROPLASTY;  Surgeon: Shelda Pal, MD;  Location: [facility] ORS;  Service: Orthopedics;  Laterality: Right;   TOTAL THYROIDECTOMY Bilateral 2022   TUBAL LIGATION     WISDOM TOOTH EXTRACTION  2000    Family History  Problem Relation Age of Onset   Thyroid disease Mother        hypothyroid   Hyperlipidemia Mother    Depression Mother     Anxiety disorder Mother    Heart attack Father 66   Hypertension Father    Arthritis Father        RA   Coronary artery disease Father    High Cholesterol Father    Coronary artery disease Brother    Heart disease Brother    Cancer Maternal Grandmother        colon   Heart attack Maternal Grandfather    Alcohol abuse Paternal Grandfather    Cancer Maternal Aunt        colon    Social History   Socioeconomic History   Marital status:  Divorced    Spouse name: Not on file   Number of children: 3   Years of education: Not on file   Highest education level: 12th grade  Occupational History   Occupation: retired - [employer]  Tobacco Use   Smoking status: Never   Smokeless tobacco: Never  Vaping Use   Vaping Use: Never used  Substance and Sexual Activity   Alcohol use: Not Currently    Comment: rarely   Drug use: No   Sexual activity: Never  Other Topics Concern   Not on file  Social History Narrative   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)   Social Determinants of Health   Financial Resource Strain: Low Risk  (10/23/2022)   Overall Financial Resource Strain (CARDIA)    Difficulty of Paying Living Expenses: Not hard at all  Food Insecurity: No Food Insecurity (10/23/2022)   Hunger Vital Sign    Worried About Running Out of Food in the Last Year: Never true    Ran Out of Food in the Last Year: Never true  Transportation Needs: No Transportation Needs (10/23/2022)   PRAPARE - Administrator, Civil Service (Medical): No    Lack of Transportation (Non-Medical): No  Physical Activity: Insufficiently Active (10/23/2022)   Exercise Vital Sign    Days of Exercise per Week: 2 days    Minutes of Exercise per Session: 20 min  Stress: No Stress Concern Present (10/23/2022)   [employer] of Occupational Health - Occupational Stress Questionnaire    Feeling of Stress : Only a little  Social Connections: Moderately Integrated (10/23/2022)   Social  Connection and Isolation Panel [NHANES]    Frequency of Communication with Friends and Family: More than three times a week    Frequency of Social Gatherings with Friends and Family: Once a week    Attends Religious Services: More than 4 times per year    Active Member of [employer] or Organizations: Yes    Attends Engineer, structural: More than 4 times per year    Marital Status: Divorced  Intimate Partner Violence: Not At Risk (08/14/2022)   Humiliation, Afraid, Rape, and Kick questionnaire    Fear of Current or Ex-Partner: No    Emotionally Abused: No    Physically Abused: No    Sexually Abused: No    Outpatient Medications Prior to Visit  Medication Sig Dispense Refill   Accu-Chek Softclix Lancets lancets Use to check blood glucose up to twice a day (Dx: Type 2 DM with long term insulin use E11.57, Z79.4) 100 each 5   acetaminophen (TYLENOL) 500 MG tablet Take 1,000 mg by mouth 2 (two) times daily as needed for moderate pain or headache.     albuterol (VENTOLIN HFA) 108 (90 Base) MCG/ACT inhaler Inhale 2 puffs every 4-6 hours as needed - rescue. 18 g 12   anastrozole (ARIMIDEX) 1 MG tablet Take 1 tablet (1 mg total) by mouth daily. 90 tablet 3   aspirin EC 81 MG tablet Take 81 mg by mouth daily. Swallow whole.     blood glucose meter kit and supplies Dispense based on patient and insurance preference. Use up to four times daily as directed. (FOR ICD-10 E10.9, E11.9). 1 each 0   budesonide-formoterol (SYMBICORT) 160-4.5 MCG/ACT inhaler Inhale 2 puffs into the lungs 2 (two) times daily. As needed     busPIRone (BUSPAR) 7.5 MG tablet TAKE ONE TABLET BY MOUTH THREE TIMES DAILY AS NEEDED FOR ANXIETY  90 tablet 5   carvedilol (COREG) 25 MG tablet Take 1 tablet (25 mg total) by mouth 2 (two) times daily with a meal. 180 tablet 1   Continuous Blood Gluc Sensor (FREESTYLE LIBRE 3 SENSOR) MISC Place 1 sensor on the skin every 14 days. Use to check glucose continuously. 2 each 5   diltiazem  (CARDIZEM CD) 120 MG 24 hr capsule TAKE ONE CAPSULE BY MOUTH DAILY 90 capsule 1   escitalopram (LEXAPRO) 20 MG tablet TAKE ONE TABLET BY MOUTH EVERY MORNING AND ONE-HALF TABLET EVERY EVENING 135 tablet 1   esomeprazole (NEXIUM) 40 MG capsule TAKE ONE CAPSULE ONCE DAILY 90 capsule 3   ezetimibe (ZETIA) 10 MG tablet TAKE ONE TABLET ONCE DAILY 90 tablet 3   fenofibrate (TRICOR) 145 MG tablet Take 1 tablet (145 mg total) by mouth daily. 90 tablet 1   Ferrous Fumarate-Folic Acid (HEMOCYTE-F) 324-1 MG TABS 1 tablet po daily 30 tablet 3   fluticasone (FLONASE) 50 MCG/ACT nasal spray Place 2 sprays into both nostrils daily. 16 g 6   furosemide (LASIX) 20 MG tablet TAKE ONE TABLET ONCE DAILY 90 tablet 0   glucose blood (ACCU-CHEK AVIVA) test strip Twice daily (please use brand that patient insurance covers) 100 each 5   insulin degludec (TRESIBA FLEXTOUCH) 200 UNIT/ML FlexTouch Pen Inject 12-20 Units into the skin daily. (Patient taking differently: Inject 12 Units into the skin daily.) 9 mL 3   Insulin Pen Needle 32G X 4 MM MISC Use 1x a day 100 each 3   levothyroxine (SYNTHROID) 200 MCG tablet Take 1 tablet (200 mcg total) by mouth daily. Along with tablet for a total daily dose of . 100 tablet 1   levothyroxine (SYNTHROID) 50 MCG tablet TAKE ONE TABLET ONCE DAILY 90 tablet 0   loratadine (CLARITIN) 10 MG tablet Take 1 tablet (10 mg total) by mouth daily. 30 tablet 11   losartan (COZAAR) 100 MG tablet TAKE ONE TABLET DAILY 90 tablet 0   metFORMIN (GLUCOPHAGE-XR) 500 MG 24 hr tablet Take 4 tablets (2,000 mg total) by mouth daily with supper. 360 tablet 3   montelukast (SINGULAIR) 10 MG tablet Take 1 tablet (10 mg total) by mouth at bedtime. 90 tablet 1   rosuvastatin (CRESTOR) 40 MG tablet TAKE ONE TABLET DAILY 90 tablet 1   Semaglutide, 1 MG/DOSE, (OZEMPIC, 1 MG/DOSE,) 4 MG/3ML SOPN Inject 1 mg into the skin once a week. 9 mL 3   Vitamin D, Ergocalciferol, (DRISDOL) 1.25 MG (50000 UNIT) CAPS  capsule Take 1 capsule (50,000 Units total) by mouth every 7 (seven) days. 4 capsule 4   No facility-administered medications prior to visit.    Allergies  Allergen Reactions   Oxycodone Other (See Comments)    Does not feel good. Makes her sleepy.    Review of Systems  Constitutional:  Negative for chills and fever.  Respiratory:  Negative for shortness of breath.   Cardiovascular:  Negative for chest pain and palpitations.  Gastrointestinal:  Negative for abdominal pain, blood in stool, constipation, diarrhea, nausea and vomiting.  Genitourinary:  Negative for dysuria, frequency, hematuria and urgency.  Skin:           Neurological:  Negative for headaches.       Objective:    Physical Exam Constitutional:      General: She is not in acute distress.    Appearance: Normal appearance. She is not ill-appearing.  HENT:     Head: Normocephalic and atraumatic.  Right Ear: External ear normal.     Left Ear: External ear normal.     Nose: Nose normal.     Mouth/Throat:     Mouth: Mucous membranes are moist.     Pharynx: Oropharynx is clear.  Eyes:     General:        Right eye: No discharge.        Left eye: No discharge.     Extraocular Movements: Extraocular movements intact.     Conjunctiva/sclera: Conjunctivae normal.     Pupils: Pupils are equal, round, and reactive to light.  Cardiovascular:     Rate and Rhythm: Normal rate and regular rhythm.     Pulses: Normal pulses.     Heart sounds: Normal heart sounds. No murmur heard.    No gallop.  Pulmonary:     Effort: Pulmonary effort is normal. No respiratory distress.     Breath sounds: Normal breath sounds. No wheezing or rales.  Abdominal:     General: Bowel sounds are normal.     Palpations: Abdomen is soft.     Tenderness: There is no abdominal tenderness. There is no guarding.  Musculoskeletal:        General: Normal range of motion.     Cervical back: Normal range of motion.     Right lower leg: No  edema.     Left lower leg: No edema.  Skin:    General: Skin is warm and dry.  Neurological:     Mental Status: She is alert and oriented to person, place, and time.  Psychiatric:        Mood and Affect: Mood normal.        Behavior: Behavior normal.        Judgment: Judgment normal.     BP 128/86 (BP Location: Right Arm, Patient Position: Sitting, Cuff Size: Normal)   Pulse 71   Temp 98.1 F (36.7 C) (Oral)   Resp 16   Ht 5\' 6"  (1.676 m)   Wt 281 lb (127.5 kg)   SpO2 97%   BMI 45.35 kg/m  Wt Readings from Last 3 Encounters:  10/24/22 281 lb (127.5 kg)  10/24/22 279 lb 9.6 oz (126.8 kg)  10/21/22 281 lb 6.4 oz (127.6 kg)    Diabetic Foot Exam - Simple   No data filed    Lab Results  Component Value Date   WBC 7.9 10/24/2022   HGB 13.4 10/24/2022   HCT 41.0 10/24/2022   PLT 287.0 10/24/2022   GLUCOSE 112 (H) 10/24/2022   CHOL 126 10/24/2022   TRIG 220.0 (H) 10/24/2022   HDL 40.40 10/24/2022   LDLDIRECT 66.0 10/24/2022   LDLCALC 52 01/15/2022   ALT 42 (H) 10/24/2022   AST 55 (H) 10/24/2022   NA 141 10/24/2022   K 4.6 10/24/2022   CL 101 10/24/2022   CREATININE 0.75 10/24/2022   BUN 12 10/24/2022   CO2 28 10/24/2022   TSH 0.61 10/24/2022   INR 1.0 04/24/2022   HGBA1C 6.2 (A) 10/24/2022    Lab Results  Component Value Date   TSH 0.61 10/24/2022   Lab Results  Component Value Date   WBC 7.9 10/24/2022   HGB 13.4 10/24/2022   HCT 41.0 10/24/2022   MCV 89.8 10/24/2022   PLT 287.0 10/24/2022   Lab Results  Component Value Date   NA 141 10/24/2022   K 4.6 10/24/2022   CHLORIDE 107 04/11/2017   CO2 28 10/24/2022   GLUCOSE 112 (  H) 10/24/2022   BUN 12 10/24/2022   CREATININE 0.75 10/24/2022   BILITOT 0.5 10/24/2022   ALKPHOS 78 10/24/2022   AST 55 (H) 10/24/2022   ALT 42 (H) 10/24/2022   PROT 7.4 10/24/2022   ALBUMIN 4.1 10/24/2022   CALCIUM 10.2 10/24/2022   ANIONGAP 9 04/24/2022   EGFR >60 04/11/2017   GFR 79.82 10/24/2022   Lab Results   Component Value Date   CHOL 126 10/24/2022   Lab Results  Component Value Date   HDL 40.40 10/24/2022   Lab Results  Component Value Date   LDLCALC 52 01/15/2022   Lab Results  Component Value Date   TRIG 220.0 (H) 10/24/2022   Lab Results  Component Value Date   CHOLHDL 3 10/24/2022   Lab Results  Component Value Date   HGBA1C 6.2 (A) 10/24/2022      Assessment & Plan:  Immunizations: Encouraged patient to consider annual COVID-19 and Influenza vaccinations as well as RSV vaccination.  Labs: Routine blood work ordered today.  Hypertension: Hypertension is well-controlled with Carvedilol 25 mg and Losartan 100 mg and continues to be monitored.  Obesity: Patient continues taking Ozempic 1 mg/mL once weekly which has been tolerable.  Osteoporosis: Patient did not tolerate Fosamax. Prescribed Prolia 60 mg/mL.  Problem List Items Addressed This Visit     Muscle cramps   Relevant Orders   Magnesium (Completed)   Vitamin D deficiency   Relevant Orders   VITAMIN D 25 Hydroxy (Vit-D Deficiency, Fractures) (Completed)   Anemia   Relevant Orders   CBC with Differential/Platelet (Completed)   Iron, TIBC and Ferritin Panel (Completed)   Breast cancer (HCC) - Primary    Following with oncology      Essential hypertension    Well controlled, no changes to meds. Encouraged heart healthy diet such as the DASH diet and exercise as tolerated.       Relevant Orders   CBC with Differential/Platelet (Completed)   Comprehensive metabolic panel (Completed)   TSH (Completed)   Hyperlipidemia    Tolerating statin, encouraged heart healthy diet, avoid trans fats, minimize simple carbs and saturated fats. Increase exercise as tolerated      Relevant Orders   Lipid panel (Completed)   Hypothyroidism    On Levothyroxine, continue to monitor      Osteoporosis    Encouraged to get adequate exercise, calcium and vitamin d intake. Did not tolerate Fosamax, will prescribe Prolia  every 6 months      Meds ordered this encounter  Medications   denosumab (PROLIA) 60 MG/ML SOSY injection    Sig: Inject 60 mg into the skin once for 1 dose.    Dispense:  1 mL    Refill:  0   I, Danise Edge, MD, personally preformed the services described in this documentation.  All medical record entries made by the scribe were at my direction and in my presence.  I have reviewed the chart and discharge instructions (if applicable) and agree that the record reflects my personal performance and is accurate and complete. 10/24/2022  I,Mohammed Iqbal,acting as a scribe for Danise Edge, MD.,have documented all relevant documentation on the behalf of Danise Edge, MD,as directed by  Danise Edge, MD while in the presence of Danise Edge, MD.  Danise Edge, MD

## 2022-10-24 NOTE — Assessment & Plan Note (Addendum)
Encouraged to get adequate exercise, calcium and vitamin d intake. Did not tolerate Fosamax, will prescribe Prolia every 6 months

## 2022-10-24 NOTE — Progress Notes (Signed)
[name], female   DOB: [birth year], 72 y.o.   MRN: [id number]  HPI: [name] is a 72 y.o.-year-old female, initially referred by her PCP, Dr. Abner Greenspan, returning for follow-up for DM2, dx in 2019 after her breast cancer treatment, insulin-dependent, uncontrolled, with complications (CAD, dCHF), h/o papillary thyroid cancer and postsurgical hypothyroidism.  Last visit 4 months ago.  Interim history: She has improved leg swelling. Also, blurry vision -appointment with ophthalmology coming up. In the past, on a plant-based diet and she lost 22 pounds in 2 months. She gained few lbs since then. She had a GIB while visiting her daughter in [place] before last visit. The source of bleeding was not found.  She got  2 blood transfusions then. No blood transfusion since last OV. She obtained a Libre CGM since then. She likes it.  DM2:  Reviewed HbA1c: Lab Results  Component Value Date   HGBA1C 5.8 (A) 06/20/2022   HGBA1C 6.3 (A) 02/14/2022   HGBA1C 9.1 (A) 11/05/2021   HGBA1C 8.6 (H) 07/16/2021   HGBA1C 8.7 (H) 03/20/2021   HGBA1C 9.7 (H) 12/06/2020   HGBA1C 8.3 (H) 09/05/2020   HGBA1C 9.5 (H) 06/02/2020   HGBA1C 7.0 (H) 09/06/2019   HGBA1C 6.3 (H) 07/02/2018   At last visit she was on: - Metformin 1000 mg 2x a day, with meals >> 2000 mg at night >> 1000 mg 2x a day - Glimepiride 1 mg with breakfast >> before breakfast - Ozempic 0.25 >> 0.5 >> 1 mg weekly - Tresiba U200 14 >> 16 units daily - started 10/2021 She was previously on [facility]em[facility].  He was tolerating it well but she had to stop it when she stopped going to the weight management clinic during the coronavirus pandemic.  We changed to: - Metformin ER 2000 mg at dinnertime - Ozempic 1 mg weekly in a.m. (was off most of the time since last OV - with her med issues) - PAP - Tresiba U200 12 units daily  Pt checks her sugars >4x a day:  Prev.: - am: 187-233 >>  171-235, 251 >> 110-117 >> 113-131, 160 - 2h after b'fast: n/c >> 130-148, 196 - before lunch: n/c  >> 151, 164 - 2h after lunch: n/c>> 120-255 >> 120-180 >> 70s >> 141-169 - before dinner: n 125-166 >> 134-164 >> 100-120 >> 77 - 2h after dinner: n/c >> 171 >> 125-225 >> n/c >> 163 - bedtime: n/c >> 164-185 >> 129, 214 >> n/c >> 109-187 - nighttime: n/c Lowest sugar was 120 >> 70 >> 77 >> 68; + hypoglycemia awareness in the 70s. Highest sugar was 278 >> 251 >> 130 >> 217 >> 264.  Glucometer: Accu-Chek  Pt's meals are: - Breakfast: eggs, sometimes sandwich with sliced [place], yoghurt - Lunch: sandwich or taco with [place] - Dinner: green bean + onion soup sauce + potato + grilled chicken - Snacks: yasso bars, cucumber Diet green tea/soda.  - no CKD, last BUN/creatinine:  Lab Results  Component Value Date   BUN 11 04/29/2022   BUN 14 04/24/2022   CREATININE 0.60 10/09/2022   CREATININE 0.54 04/29/2022  She is is on losartan 100 mg daily.  -+ HL; last set of lipids: Lab Results  Component Value Date   CHOL 120 01/15/2022   HDL 31 (L) 01/15/2022   LDLCALC 52 01/15/2022   LDLDIRECT 56.0 07/16/2021   TRIG 227 (H) 01/15/2022   CHOLHDL 3.9 01/15/2022  She is on Crestor 40 mg daily, Zetia 10 mg daily, Tricor 145 mg daily. She is not taking Vascepa.  - last eye exam was in 10/2021. No DR. she has a history of cataracts. She has blurry vision in L eye - may need to have cataract sx.  - no numbness and tingling in her feet.  Last foot exam 02/14/2022.  Pt has FH of DM in [facility].  She also has a history of postsurgical hypothyroidism:  She is on levothyroxine 250 mcg daily: - in am - fasting - at least 30-60 min from b'fast - no calcium - no iron - no multivitamins - + PPIs (Nexium) now >4h after Levothyroxine - not on Biotin  Reviewed previous TFTs: Lab Results  Component Value Date   TSH 1.96 02/14/2022   TSH 5.18 08/31/2021   TSH 8.97 (H) 07/16/2021   TSH 7.93 (H)  06/22/2021   TSH 21.15 (H) 05/07/2021   TSH 21.89 (H) 03/20/2021   TSH 29.57 (H) 12/06/2020   TSH 12.31 (A) 08/04/2020   TSH 1.84 09/06/2019   TSH 2.130 03/26/2018   TSH 1.87 12/22/2017   TSH 0.81 09/22/2017   TSH 1.31 05/22/2017   TSH 1.84 02/17/2017   TSH 0.95 05/02/2016   TSH 1.22 10/16/2015   TSH 0.89 04/17/2015   TSH 1.20 10/13/2014   TSH 1.34 07/11/2014   TSH 0.557 12/31/2013   Lab Results  Component Value Date   FREET4 1.39 02/14/2022   FREET4 1.33 03/26/2018   No FH of thyroid cancer. No h/o radiation tx to head or neck. No herbal supplements. No Biotin use. No recent steroids use.   She also has a history of a goiter >> s/p thyroidectomy 06/2020 >> papillary thyroid cancer  I reviewed the report of the neck CT (06/22/2020): Thyroid: Markedly diffusely enlarged and heterogeneous with multiple poorly defined nodules. This extends into the superior aspect of the superior mediastinum. At the thoracic inlet, this is causing moderate narrowing of the trachea with a transverse diameter of 9 mm. This measured 6 mm on the chest CTA.  A thyroid U/S (04/29/2016) showed no individual nodules: Parenchymal Echotexture: Markedly heterogenous Estimated total number of nodules >/= 1 cm: 0 Isthmus: 1.3 cm; Heterogeneous parenchyma, enlarged. Right lobe: 11.1 cm x 5.2 cm x 4.6 cm; Heterogeneous lobulated parenchyma with no focal nodule. Left lobe: 9.0 cm x 3.6 cm x 6.0 cm; Heterogeneous lobulated parenchyma with no focal nodule.   IMPRESSION: Enlarged multilobulated thyroid compatible with multinodular goiter and medical thyroid disease. No focal nodule identified.  Total thyroidectomy (07/24/2020): A: Substernal thyroid, total substernal thyroidectomy - Multinodular goiter, clinically substernal. - Incidental microscopic papillary thyroid carcinoma, 1 mm in greatest dimension, margins uninvolved. - See synoptic template below. - One lymph node from soft tissue adjacent to  thyroid gland with no tumor seen (0/1).   B: Thyroid, pyramidal lobe tissue, excision - Benign thyroid tissue with adenomatous nodule, no tumor seen.   She also has a history of HTN, GERD, osteoarthritis, anxiety/depression, vitamin D deficiency, breast cancer, urinary incontinence, frequency, urgency, transaminitis, OSA -on CPAP. No personal h/o pancreatitis or FH of MTC. She had CSF repair sx.  ROS: + See HPI, also: ENT: + hypoacusis  Past Medical History:  Diagnosis Date   Acquired dilation of ascending aorta and aortic root (HCC)    Ascending arctic 43 mm and aortic root 40 mm by chest CTA 09/2022   Anemia 04/05/2012   Anxiety    Anxiety state 09/11/2007  Qualifier: Diagnosis of  By: Tawanna Cooler RN, Alvino Chapel     Asthma    Atrial tachycardia, paroxysmal    Back pain 10/02/2014   Breast cancer (HCC) 02/17/2017   CAD (coronary artery disease), native coronary artery    remote Cath with nonobstructive ASCAD with 20% mid LAD, 20% prox left circ and 20% ostial RCA   Chicken pox as a child   Chronic diastolic CHF (congestive heart failure), NYHA class 1 (HCC)    Complication of anesthesia    pt states feels different in her body after anesthesia when waking up and also experiences a smell of burnt plastic for approx a wk    Constipation    Depression    Diabetes (HCC)    Dilated aortic root (HCC)    42mm by echo 08/2020   Dysuria 02/17/2017   Elevated LFTs 04/05/2012   GERD (gastroesophageal reflux disease)    Goiter    Heart murmur    hx of one at birth    History of kidney stones    History of radiation therapy 10/31/16-12/17/16   left breast 45 Gy in 25 fractions, left breast boost 16 Gy in 8 fractions   Hx of colonic polyps    Hyperlipidemia    Hypertension    Insomnia 10/08/2016   Joint pain    Malignant neoplasm of upper-outer quadrant of left female breast (HCC) 05/14/2016   not with patient   Measles as a child   Mumps as a child   OA (osteoarthritis) of knee 04/05/2012    Obesity 07/11/2014   PONV (postoperative nausea and vomiting)    Preventative health care 09/05/2013   PVC's (premature ventricular contractions) 05/02/2016   Shortness of breath dyspnea    walking distances / climbing stairs   Sleep apnea 04/05/2012   Tinea corporis 02/23/2013   Type 2 diabetes mellitus with hyperglycemia (HCC) 12/31/2013   Vasomotor rhinitis 04/05/2012   Ventral hernia    Wheezing    Past Surgical History:  Procedure Laterality Date   BREAST LUMPECTOMY Left 2017   BREAST LUMPECTOMY WITH RADIOACTIVE SEED AND SENTINEL LYMPH NODE BIOPSY Left 06/21/2016   Procedure: LEFT BREAST LUMPECTOMY WITH RADIOACTIVE SEED AND SENTINEL LYMPH NODE BIOPSY, INJECT BLUE DYE LEFT BREAST;  Surgeon: Claud Kelp, MD;  Location: [facility] OR;  Service: General;  Laterality: Left;   CARDIAC CATHETERIZATION     normal coroary arteries per patient   CARDIOVASCULAR STRESS TEST     10/12/2013   CESAREAN SECTION     X 3   CHOLECYSTECTOMY     COLONOSCOPY WITH PROPOFOL N/A 03/28/2015   Procedure: COLONOSCOPY WITH PROPOFOL;  Surgeon: Charna Elizabeth, MD;  Location: [facility] ENDOSCOPY;  Service: Endoscopy;  Laterality: N/A;   HERNIA REPAIR  02/08/2011   ventral hernia   JOINT REPLACEMENT     bilateral   KNEE ARTHROSCOPY  05/2010   bilateral   LEFT AND RIGHT HEART CATHETERIZATION WITH CORONARY ANGIOGRAM N/A 11/08/2013   Procedure: LEFT AND RIGHT HEART CATHETERIZATION WITH CORONARY ANGIOGRAM;  Surgeon: Kathleene Hazel, MD;  Location: [facility] CATH LAB;  Service: Cardiovascular;  Laterality: N/A;   MENISCUS REPAIR  2009   MOUTH SURGERY     teeth implants   PORT-A-CATH REMOVAL N/A 01/24/2017   Procedure: REMOVAL PORT-A-CATH;  Surgeon: Claud Kelp, MD;  Location: [facility] OR;  Service: General;  Laterality: N/A;   PORTACATH PLACEMENT Right 07/16/2016   Procedure: INSERTION PORT-A-CATH RIGHT INTERNAL JUGULAR WITH ULTRASOUND;  Surgeon: Claud Kelp, MD;  Location:  [facility] OR;  Service: General;  Laterality: Right;    REPAIR DURAL / CSF LEAK  2021   performed at [facility] in [place]   TOTAL KNEE ARTHROPLASTY  2011   left   TOTAL KNEE ARTHROPLASTY Right 05/10/2014   Procedure: RIGHT TOTAL KNEE ARTHROPLASTY;  Surgeon: Shelda Pal, MD;  Location: [facility] ORS;  Service: Orthopedics;  Laterality: Right;   TOTAL THYROIDECTOMY Bilateral 2022   TUBAL LIGATION     WISDOM TOOTH EXTRACTION  2000   Social History   Socioeconomic History   Marital status: Divorced    Spouse name: Not on file   Number of children: 3   Years of education: Not on file   Highest education level: 12th grade  Occupational History   Occupation: retired - [place]  Tobacco Use   Smoking status: Never   Smokeless tobacco: Never  Vaping Use   Vaping Use: Never used  Substance and Sexual Activity   Alcohol use: Not Currently    Comment: rarely   Drug use: No   Sexual activity: Never  Other Topics Concern   Not on file  Social History Narrative   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)   Social Determinants of Health   Financial Resource Strain: Low Risk  (10/23/2022)   Overall Financial Resource Strain (CARDIA)    Difficulty of Paying Living Expenses: Not hard at all  Food Insecurity: No Food Insecurity (10/23/2022)   Hunger Vital Sign    Worried About Running Out of Food in the Last Year: Never true    Ran Out of Food in the Last Year: Never true  Transportation Needs: No Transportation Needs (10/23/2022)   PRAPARE - Administrator, Civil Service (Medical): No    Lack of Transportation (Non-Medical): No  Physical Activity: Insufficiently Active (10/23/2022)   Exercise Vital Sign    Days of Exercise per Week: 2 days    Minutes of Exercise per Session: 20 min  Stress: No Stress Concern Present (10/23/2022)   [employer] of Occupational Health - Occupational Stress Questionnaire    Feeling of Stress : Only a little  Social Connections: Moderately Integrated (10/23/2022)   Social  Connection and Isolation Panel [NHANES]    Frequency of Communication with Friends and Family: More than three times a week    Frequency of Social Gatherings with Friends and Family: Once a week    Attends Religious Services: More than 4 times per year    Active Member of [employer] or Organizations: Yes    Attends Engineer, structural: More than 4 times per year    Marital Status: Divorced  Intimate Partner Violence: Not At Risk (08/14/2022)   Humiliation, Afraid, Rape, and Kick questionnaire    Fear of Current or Ex-Partner: No    Emotionally Abused: No    Physically Abused: No    Sexually Abused: No   Current Outpatient Medications on File Prior to Visit  Medication Sig Dispense Refill   Accu-Chek Softclix Lancets lancets Use to check blood glucose up to twice a day (Dx: Type 2 DM with long term insulin use E11.57, Z79.4) 100 each 5   acetaminophen (TYLENOL) 500 MG tablet Take 1,000 mg by mouth 2 (two) times daily as needed for moderate pain or headache.     albuterol (VENTOLIN HFA) 108 (90 Base) MCG/ACT inhaler Inhale 2 puffs every 4-6 hours as needed - rescue. 18 g 12   anastrozole (ARIMIDEX) 1 MG  tablet Take 1 tablet (1 mg total) by mouth daily. 90 tablet 3   aspirin EC 81 MG tablet Take 81 mg by mouth daily. Swallow whole.     blood glucose meter kit and supplies Dispense based on patient and insurance preference. Use up to four times daily as directed. (FOR ICD-10 E10.9, E11.9). 1 each 0   budesonide-formoterol (SYMBICORT) 160-4.5 MCG/ACT inhaler Inhale 2 puffs into the lungs 2 (two) times daily. As needed     busPIRone (BUSPAR) 7.5 MG tablet TAKE ONE TABLET BY MOUTH THREE TIMES DAILY AS NEEDED FOR ANXIETY 90 tablet 5   carvedilol (COREG) 25 MG tablet Take 1 tablet (25 mg total) by mouth 2 (two) times daily with a meal. 180 tablet 1   Continuous Blood Gluc Sensor (FREESTYLE LIBRE 3 SENSOR) MISC Place 1 sensor on the skin every 14 days. Use to check glucose continuously. 2 each 5    diltiazem (CARDIZEM CD) 120 MG 24 hr capsule TAKE ONE CAPSULE BY MOUTH DAILY 90 capsule 1   escitalopram (LEXAPRO) 20 MG tablet TAKE ONE TABLET BY MOUTH EVERY MORNING AND ONE-HALF TABLET EVERY EVENING 135 tablet 1   esomeprazole (NEXIUM) 40 MG capsule TAKE ONE CAPSULE ONCE DAILY 90 capsule 3   ezetimibe (ZETIA) 10 MG tablet TAKE ONE TABLET ONCE DAILY 90 tablet 3   fenofibrate (TRICOR) 145 MG tablet Take 1 tablet (145 mg total) by mouth daily. 90 tablet 1   Ferrous Fumarate-Folic Acid (HEMOCYTE-F) 324-1 MG TABS 1 tablet po daily 30 tablet 3   fluticasone (FLONASE) 50 MCG/ACT nasal spray Place 2 sprays into both nostrils daily. 16 g 6   furosemide (LASIX) 20 MG tablet TAKE ONE TABLET ONCE DAILY 90 tablet 0   glucose blood (ACCU-CHEK AVIVA) test strip Twice daily (please use brand that patient insurance covers) 100 each 5   insulin degludec (TRESIBA FLEXTOUCH) 200 UNIT/ML FlexTouch Pen Inject 12-20 Units into the skin daily. (Patient taking differently: Inject 12 Units into the skin daily.) 9 mL 3   Insulin Pen Needle 32G X 4 MM MISC Use 1x a day 100 each 3   levothyroxine (SYNTHROID) 200 MCG tablet Take 1 tablet (200 mcg total) by mouth daily. Along with tablet for a total daily dose of . 100 tablet 1   levothyroxine (SYNTHROID) 50 MCG tablet TAKE ONE TABLET ONCE DAILY 90 tablet 0   loratadine (CLARITIN) 10 MG tablet Take 1 tablet (10 mg total) by mouth daily. 30 tablet 11   losartan (COZAAR) 100 MG tablet TAKE ONE TABLET DAILY 90 tablet 0   metFORMIN (GLUCOPHAGE-XR) 500 MG 24 hr tablet Take 4 tablets (2,000 mg total) by mouth daily with supper. 360 tablet 3   montelukast (SINGULAIR) 10 MG tablet Take 1 tablet (10 mg total) by mouth at bedtime. 90 tablet 1   rosuvastatin (CRESTOR) 40 MG tablet TAKE ONE TABLET DAILY 90 tablet 1   Semaglutide, 1 MG/DOSE, (OZEMPIC, 1 MG/DOSE,) 4 MG/3ML SOPN Inject 1 mg into the skin once a week. 9 mL 3   Vitamin D, Ergocalciferol, (DRISDOL) 1.25 MG  (50000 UNIT) CAPS capsule Take 1 capsule (50,000 Units total) by mouth every 7 (seven) days. 4 capsule 4   No current facility-administered medications on file prior to visit.   Allergies  Allergen Reactions   Oxycodone Other (See Comments)    Does not feel good. Makes her sleepy.    Family History  Problem Relation Age of Onset   Thyroid disease Mother  hypothyroid   Hyperlipidemia Mother    Depression Mother    Anxiety disorder Mother    Heart attack Father 7   Hypertension Father    Arthritis Father        RA   Coronary artery disease Father    High Cholesterol Father    Coronary artery disease Brother    Heart disease Brother    Cancer Maternal Grandmother        colon   Heart attack Maternal Grandfather    Alcohol abuse Paternal Grandfather    Cancer Maternal Aunt        colon   PE: BP 124/86 (BP Location: Left Arm, Patient Position: Sitting, Cuff Size: Normal)   Pulse 84   Ht 5\' 6"  (1.676 m)   Wt 279 lb 9.6 oz (126.8 kg)   SpO2 99%   BMI 45.13 kg/m  Wt Readings from Last 3 Encounters:  10/24/22 279 lb 9.6 oz (126.8 kg)  10/21/22 281 lb 6.4 oz (127.6 kg)  08/26/22 276 lb (125.2 kg)   Constitutional: overweight, in NAD Eyes: no exophthalmos ENT: no masses palpated in neck, no cervical lymphadenopathy Cardiovascular: RRR, No MRG, + B periankle edema Respiratory: CTA B Musculoskeletal: no deformities Skin: no rashes Neurological: no tremor with outstretched hands  ASSESSMENT: 1. DM2, insulin-dependent, uncontrolled, with complications - CAD - dCHF  2.  Postsurgical hypothyroidism  3.  Microscopic papillary thyroid cancer  PLAN:  1. Patient with longstanding, uncontrolled, type 2 diabetes, on oral antidiabetic regimen with metformin, weekly GLP-1 receptor agonist and long-acting insulin, with improving control.  At last visit, HbA1c was lower, at 5.8%, but this was lower than expected from her blood sugars, possibly due to the transfusion that  she received before the visit.  However, sugars appears to be mostly at goal, with occasional slight hyperglycemic exceptions, possibly due to being off Ozempic for a longer period of time due to medical problems.  She just restarted it before last visit and she was tolerating it well.  We did not change her regimen. CGM interpretation: -At today's visit, we reviewed her CGM downloads: It appears that 89% of values are in target range (goal >70%), while 11% are higher than 180 (goal <25%), and 0% are lower than 70 (goal <4%).  The calculated average blood sugar is 145.  The projected HbA1c for the next 3 months (GMI) is 6.8%. -Reviewing the CGM trends, sugars are fluctuating within the target range, with only occasional mild hyperglycemic exceptions.  Overall, they are slightly higher than expected from the HbA1c, and we discussed that if the sugars remain closer to the upper limit of the target range, she can increase the dose of the long-acting insulin.  Otherwise, we will continue the current regimen. -She continues on a plant-based diet but tells me that she really misses eggs.  She is contemplating having an egg every once in a while. -I advised her to: Patient Instructions  Please continue: - Metformin ER 2000 mg at dinnertime - Ozempic 1 mg weekly in a.m.  - Evaristo Bury U200 12 units daily (may increase to 14 units if sugars stay elevated)  Continue Levothyroxine 250 mcg daily.  Take the thyroid hormone every day, with water, at least 30 minutes before breakfast, separated by at least 4 hours from: - acid reflux medications - calcium - iron - multivitamins  Please return in 4 months.  - we checked her HbA1c: 6.2% (higher) - advised to check sugars at different times of the  day - 4x a day, rotating check times - advised for yearly eye exams >> she is UTD - return to clinic in 4 months  2.  Postsurgical hypothyroidism - latest thyroid labs reviewed with pt. >> normal: Lab Results   Component Value Date   TSH 1.96 02/14/2022  - she continues on LT4 250 mcg daily - I recommended to chew the tablet for better absorption - pt feels good on this dose. - we discussed about taking the thyroid hormone every day, with water, >30 minutes before breakfast, separated by >4 hours from acid reflux medications, calcium, iron, multivitamins. Pt. is taking it correctly. - will check thyroid tests at next visit  3.  Microscopic papillary thyroid cancer -She had 1 focus of 1 mm papillary thyroid cancer per review of records from [facility].  This was incidentally discovered on pathology after removal of compressive goiter -I discussed with patient and her daughter in the past that she was most likely cured  -No neck compression symptoms or masses felt on palpation of her neck today -Will continue to monitor her clinically  Carlus Pavlov, MD PhD [facility] Endocrinology

## 2022-10-24 NOTE — Patient Instructions (Addendum)
RSV, Respiratory Syncitial Virus vaccine, Arexvy vaccine at pharmacy  Vegan Diet A vegan diet excludes all foods that come from animals, including foods that are made from meat, fish, poultry, dairy, and eggs. A vegan diet may be followed for ethical reasons or health reasons, or both. What are tips for following this plan? While following this diet, it is important to find plant sources or supplements that contain the same nutrients you would find in the foods that come from animals. Talk with your health care provider or dietitian about whether you should be taking nutritional supplements. What do I need to know about the vegan diet? This diet includes all foods that come from plants. This diet excludes all foods that come from animal sources. A vegan diet can lack certain nutrients that are commonly found in animal products. These nutrients include: Protein. Vitamin B12. Vitamin D. Iron. Omega-3 fatty acids. Calcium. Zinc. Iodine. What foods can I eat?  Fruits Any. Vegetables Any. Grains Any. Include whole grains as they provide a good source of protein. Meats and other proteins Beans, such as black beans or kidney beans. Other legumes, such as lentils and split peas. Soy products. Nuts, such as almonds, [place] nuts, and pecans. Seeds, such as sunflower seeds. Tofu. Tempeh. Hummus. Dairy Any dairy product that is made with milk that comes from soy, almonds, rice, hemp, coconut, or any other type of nut. Fats and oils All vegetable-based oils, such as olive, canola, coconut, corn, safflower, peanut, and sesame oils. All vegan butters. Beverages Juice. Carbonated soft drinks. Tea. Sweets and desserts Any dessert that is labeled "vegan." Ice cream that is made with milk from soy, almond, rice, hemp, coconut, or other nuts, and does not have other animal ingredients (such as chocolate chips that are made from cow milk). Vitamin B12 Breakfast cereals and prepared products that have  vitamin B12 added to them (are fortified). Nutritional yeast. Vitamin D Orange juice fortified with vitamin D. Fortified mushrooms. Fortified cereals. Iron Dark leafy greens. Nuts. Nut butters. Pumpkin seeds. Beans. Grain products that have added iron, such as cereals. Tofu. Tempeh. Soybeans. Quinoa. Molasses. Plant-based iron is absorbed better when it is eaten with a food that has vitamin C. Omega-3 fatty acids Walnuts. Foods with added omega-3 fatty acids, such as juices. Flax seeds. Hemp seeds. Canola oil and soybean oil. Tofu. Calcium Dark leafy greens, such as kale, bok choy, Chinese cabbage, collard greens, and mustard greens. Broccoli. Okra. Soy products with added calcium. Calcium-fortified breakfast cereals. Calcium-fortified fruit juices. Figs. Zinc Wheat germ, cereals, and breads that have added zinc. Baked beans. Legumes, such as cashews, chickpeas, kidney beans, and green peas. Almonds and nut butters. Tofu and other soy products. Iodine Iodized table salt. Seaweed. Seasonings and condiments Nutritional yeast. Salt. Pepper. Fresh herbs. Soy sauce. Vegetable broth. The items listed above may not be a complete list of foods and beverages you can eat. Contact a dietitian for more options. What foods should I avoid? Fruits No fruits need to be avoided. All fruits are included in this diet. Vegetables No vegetables need to be avoided. All vegetables are included in this diet. Grains No grains need to be avoided. All grains are included in this diet. Meats and other proteins Any animal meats. Poultry. Eggs. Fish. Seafood. Dairy Milk, cheese, yogurt, or ice cream that is made from cow milk, goat milk, or sheep milk. Fats and oils Butter. Lard. Beverages Cow milk. Goat milk. Sheep milk. Drinks that are sweetened with honey. Sweets  and desserts Any desserts that are made with eggs or animal milk. Honey. Cheesecake. Seasonings and condiments Honey. Any condiments made with eggs  or animal milk. The items listed above may not be a complete list of foods and beverages to avoid. Contact a dietitian for more information. Summary A vegan diet excludes any foods or drinks that come from an animal. A vegan diet may be followed for ethical reasons or health reasons, or both. Following this diet can put you at risk of becoming deficient in specific vitamins and minerals. These include vitamin B12, iron, calcium, iodine, and zinc. Talk with your health care provider or dietitian to make sure you get these essential nutrients in your diet. This information is not intended to replace advice given to you by your health care provider. Make sure you discuss any questions you have with your health care provider. Document Revised: 04/05/2021 Document Reviewed: 04/05/2021 Elsevier Patient Education  2023 [employer].

## 2022-10-24 NOTE — Patient Instructions (Addendum)
Please continue: - Metformin ER 2000 mg at dinnertime - Ozempic 1 mg weekly in a.m.  - Evaristo Bury U200 12 units daily (may increase to 14 units if sugars stay elevated)  Continue Levothyroxine 250 mcg daily.  Take the thyroid hormone every day, with water, at least 30 minutes before breakfast, separated by at least 4 hours from: - acid reflux medications - calcium - iron - multivitamins  Please return in 4 months.

## 2022-10-25 ENCOUNTER — Other Ambulatory Visit: Payer: Self-pay

## 2022-10-25 LAB — CBC WITH DIFFERENTIAL/PLATELET
Basophils Absolute: 0.1 K/uL (ref 0.0–0.1)
Basophils Relative: 0.6 % (ref 0.0–3.0)
Eosinophils Absolute: 0 K/uL (ref 0.0–0.7)
Eosinophils Relative: 0.6 % (ref 0.0–5.0)
HCT: 41 % (ref 36.0–46.0)
Hemoglobin: 13.4 g/dL (ref 12.0–15.0)
Lymphocytes Relative: 23.3 % (ref 12.0–46.0)
Lymphs Abs: 1.8 K/uL (ref 0.7–4.0)
MCHC: 32.6 g/dL (ref 30.0–36.0)
MCV: 89.8 fl (ref 78.0–100.0)
Monocytes Absolute: 0.4 K/uL (ref 0.1–1.0)
Monocytes Relative: 5.2 % (ref 3.0–12.0)
Neutro Abs: 5.6 K/uL (ref 1.4–7.7)
Neutrophils Relative %: 70.3 % (ref 43.0–77.0)
Platelets: 287 K/uL (ref 150.0–400.0)
RBC: 4.56 Mil/uL (ref 3.87–5.11)
RDW: 16.4 % — ABNORMAL HIGH (ref 11.5–15.5)
WBC: 7.9 K/uL (ref 4.0–10.5)

## 2022-10-25 LAB — IRON,TIBC AND FERRITIN PANEL
%SAT: 13 % (calc) — ABNORMAL LOW (ref 16–45)
Ferritin: 12 ng/mL — ABNORMAL LOW (ref 16–288)
Iron: 66 ug/dL (ref 45–160)
TIBC: 495 mcg/dL (calc) — ABNORMAL HIGH (ref 250–450)

## 2022-10-25 LAB — COMPREHENSIVE METABOLIC PANEL
ALT: 42 U/L — ABNORMAL HIGH (ref 0–35)
AST: 55 U/L — ABNORMAL HIGH (ref 0–37)
Albumin: 4.1 g/dL (ref 3.5–5.2)
Alkaline Phosphatase: 78 U/L (ref 39–117)
BUN: 12 mg/dL (ref 6–23)
CO2: 28 mEq/L (ref 19–32)
Calcium: 10.2 mg/dL (ref 8.4–10.5)
Chloride: 101 mEq/L (ref 96–112)
Creatinine, Ser: 0.75 mg/dL (ref 0.40–1.20)
GFR: 79.82 mL/min (ref >60.00)
Glucose, Bld: 112 mg/dL — ABNORMAL HIGH (ref 70–99)
Potassium: 4.6 mEq/L (ref 3.5–5.1)
Sodium: 141 mEq/L (ref 135–145)
Total Bilirubin: 0.5 mg/dL (ref 0.2–1.2)
Total Protein: 7.4 g/dL (ref 6.0–8.3)

## 2022-10-25 LAB — LIPID PANEL
Cholesterol: 126 mg/dL (ref 0–200)
HDL: 40.4 mg/dL (ref >39.00)
NonHDL: 86.02
Total CHOL/HDL Ratio: 3
Triglycerides: 220 mg/dL — ABNORMAL HIGH (ref 0.0–149.0)
VLDL: 44 mg/dL — ABNORMAL HIGH (ref 0.0–40.0)

## 2022-10-25 LAB — MAGNESIUM: Magnesium: 1.9 mg/dL (ref 1.5–2.5)

## 2022-10-25 LAB — VITAMIN D 25 HYDROXY (VIT D DEFICIENCY, FRACTURES): VITD: <7 ng/mL — ABNORMAL LOW (ref 30.00–100.00)

## 2022-10-25 LAB — TSH: TSH: 0.61 u[IU]/mL (ref 0.35–5.50)

## 2022-10-25 LAB — LDL CHOLESTEROL, DIRECT: Direct LDL: 66 mg/dL

## 2022-10-25 MED ORDER — VITAMIN D (ERGOCALCIFEROL) 1.25 MG (50000 UNIT) PO CAPS: 50000.0000 [IU] | ORAL_CAPSULE | ORAL | 4 refills | Status: DC

## 2022-10-25 NOTE — Telephone Encounter (Signed)
Message was sent to [facility] and  [place]

## 2022-10-25 NOTE — Telephone Encounter (Signed)
Dr. Abner Greenspan would like to start Prolia.  She tried Fosamax but was unable to tolerate it-failed

## 2022-10-26 ENCOUNTER — Encounter: Payer: Self-pay | Admitting: Family Medicine

## 2022-10-28 DIAGNOSIS — E119 Type 2 diabetes mellitus without complications: Secondary | ICD-10-CM | POA: Diagnosis not present

## 2022-10-28 LAB — HM DIABETES EYE EXAM

## 2022-10-31 ENCOUNTER — Other Ambulatory Visit: Payer: Self-pay | Admitting: Family Medicine

## 2022-11-01 ENCOUNTER — Encounter: Payer: Self-pay | Admitting: Internal Medicine

## 2022-11-01 ENCOUNTER — Telehealth: Payer: Self-pay | Admitting: Family Medicine

## 2022-11-01 NOTE — Telephone Encounter (Signed)
Called pt regarding her insulin, pt stated she understand.

## 2022-11-01 NOTE — Telephone Encounter (Signed)
Patient called and has some questions regarding sensor she is wearing. Please call

## 2022-11-01 NOTE — Telephone Encounter (Signed)
Please Advised   Pt called stated been having low blood sugar at night 52,62,and 68. Only use 12 u but if her numbers are low she don't take any insulin that night.

## 2022-11-04 ENCOUNTER — Telehealth: Payer: Self-pay

## 2022-11-04 ENCOUNTER — Other Ambulatory Visit (HOSPITAL_COMMUNITY): Payer: Self-pay

## 2022-11-04 NOTE — Telephone Encounter (Signed)
Created new encounter for Prolia BIV. Will route encounter back once benefit verification is complete. 

## 2022-11-04 NOTE — Telephone Encounter (Signed)
Prolia VOB initiated via [email or web address] 

## 2022-11-04 NOTE — Telephone Encounter (Signed)
[employer] requires failure of both an oral and IV bisphosphate. Can fill through pharmacy without PA. Ran test claim, came back refill too soon. After 1st injection, we can run PA through for medical as continuation of therapy.

## 2022-11-05 ENCOUNTER — Other Ambulatory Visit (HOSPITAL_COMMUNITY): Payer: Self-pay

## 2022-11-05 ENCOUNTER — Other Ambulatory Visit: Payer: Medicare Other | Admitting: Pharmacist

## 2022-11-05 ENCOUNTER — Other Ambulatory Visit: Payer: Self-pay

## 2022-11-05 MED ORDER — FUROSEMIDE 20 MG PO TABS: 20.0000 mg | ORAL_TABLET | Freq: Every day | ORAL | 1 refills | Status: DC

## 2022-11-05 MED ORDER — ESCITALOPRAM OXALATE 20 MG PO TABS: ORAL_TABLET | ORAL | 1 refills | Status: DC

## 2022-11-05 MED ORDER — DENOSUMAB 60 MG/ML ~~LOC~~ SOSY
60.0000 mg | PREFILLED_SYRINGE | Freq: Once | SUBCUTANEOUS | 0 refills | Status: DC
  Filled 2022-11-05: qty 1, 180d supply, fill #0
  Filled 2022-11-05: qty 1, 1d supply, fill #0

## 2022-11-05 MED ORDER — LOSARTAN POTASSIUM 100 MG PO TABS: 100.0000 mg | ORAL_TABLET | Freq: Every day | ORAL | 1 refills | Status: DC

## 2022-11-05 MED ORDER — DILTIAZEM HCL ER COATED BEADS 120 MG PO CP24: 120.0000 mg | ORAL_CAPSULE | Freq: Every day | ORAL | 1 refills | Status: DC

## 2022-11-05 MED ORDER — METFORMIN HCL ER 500 MG PO TB24: 2000.0000 mg | ORAL_TABLET | Freq: Every day | ORAL | 1 refills | Status: DC

## 2022-11-05 NOTE — Progress Notes (Signed)
Pharmacy Note  11/05/2022 Name: [name] MRN: [id number] DOB: April 11, 1951  Subjective: [name] is a 72 y.o. year old female who is a primary care patient of [name], MD. Clinical Pharmacist Practitioner referral was placed to assist with diabetes / medication management.    Engaged with patient by telephone for follow up visit today.  Type 2 DM:  Current therapy: Ozempic 1mg  weekly, Tresiba 8 to 12 units daily and metformin ER 500mg  4 tablets daily. Has been approved for medication assistance program for [place] and Ozempic for 2024. (Delivered to endo office)   Patient is using Continuous Glucose Monitor Freestyle Libre 3. See report below for the last 14 days.  Patient called Dr Abner Greenspan 11/01/2022 and reproted having low blood glucose during night - 52, 62 and 68. Dr Abner Greenspan recommended she lower dose of Tresiba to 8 units daily. Patient has continued to have lows. She has been checking with finger stick and she is usually getting blood glucose about 20 points higher. She states lows started when she started her most recent sensor and she wonders if sensor if inaccurate.   Continuous Glucose Monitor report for last 14 days below: Marland Kitchen           Medication Management - reviewed med list and refill history.     Objective: Review of patient status, including review of consultants reports, laboratory and other test data, was performed as part of comprehensive evaluation and provision of chronic care management services.   Lab Results  Component Value Date   CREATININE 0.75 10/24/2022   CREATININE 0.60 10/09/2022   CREATININE 0.54 04/29/2022    Lab Results  Component Value Date   HGBA1C 6.2 (A) 10/24/2022       Component Value Date/Time   CHOL 126 10/24/2022 1451   CHOL 120 01/15/2022 0753   TRIG 220.0 (H) 10/24/2022 1451   HDL 40.40 10/24/2022 1451   HDL 31 (L) 01/15/2022 0753   CHOLHDL 3 10/24/2022 1451   VLDL 44.0 (H) 10/24/2022 1451    LDLCALC 52 01/15/2022 0753   LDLCALC 67 06/02/2020 1427   LDLDIRECT 66.0 10/24/2022 1451     Clinical ASCVD: Yes  The ASCVD Risk score (Arnett DK, et al., 2019) failed to calculate for the following reasons:   The valid total cholesterol range is 130 to 320 mg/dL    BP Readings from Last 3 Encounters:  10/24/22 128/86  10/24/22 124/86  10/21/22 134/88     Allergies  Allergen Reactions   Oxycodone Other (See Comments)    Does not feel good. Makes her sleepy.    Medications Reviewed Today     Reviewed by Kathi Ludwig, CMA (Certified Medical Assistant) on 10/24/22 at 1400  Med List Status: <None>   Medication Order Taking? Sig Documenting Provider Last Dose Status Informant  Accu-Chek Softclix Lancets lancets [id number]  Use to check blood glucose up to twice a day (Dx: Type 2 DM with long term insulin use E11.57, Z79.4) [name], MD  Active   acetaminophen (TYLENOL) 500 MG tablet [id number]  Take 1,000 mg by mouth 2 (two) times daily as needed for moderate pain or headache. [provider]  Active Self  albuterol (VENTOLIN HFA) 108 (90 Base) MCG/ACT inhaler [id number]  Inhale 2 puffs every 4-6 hours as needed - rescue. Jetty Duhamel D, MD  Active   anastrozole (ARIMIDEX) 1 MG tablet [id number]  Take 1 tablet (1 mg total) by mouth daily. Serena Croissant,  MD  Active   aspirin EC 81 MG tablet [id number]  Take 81 mg by mouth daily. Swallow whole. [provider]  Active   blood glucose meter kit and supplies [id number]  Dispense based on patient and insurance preference. Use up to four times daily as directed. (FOR ICD-10 E10.9, E11.9). [name], MD  Active   budesonide-formoterol [facility]) 160-4.5 MCG/ACT inhaler [id number]  Inhale 2 puffs into the lungs 2 (two) times daily. As needed [provider]  Active            Med Note Clydie Braun, Glenna Durand   Fri Nov 30, 2021  4:14 PM) Approved to receive from [facility] and me program thru 06/23/2022  busPIRone  (BUSPAR) 7.5 MG tablet [id number]  TAKE ONE TABLET BY MOUTH THREE TIMES DAILY AS NEEDED FOR ANXIETY [name], MD  Active   carvedilol (COREG) 25 MG tablet [id number]  Take 1 tablet (25 mg total) by mouth 2 (two) times daily with a meal. [name], MD  Active   Continuous Blood Gluc Sensor (FREESTYLE LIBRE 3 SENSOR) [place] [id number]  Place 1 sensor on the skin every 14 days. Use to check glucose continuously. [name], MD  Active   diltiazem [facility] CD) 120 MG 24 hr capsule [id number]  TAKE ONE CAPSULE BY MOUTH DAILY Sandford Craze, NP  Active   escitalopram (LEXAPRO) 20 MG tablet [id number]  TAKE ONE TABLET BY MOUTH EVERY MORNING AND ONE-HALF TABLET EVERY [place], Efraim Kaufmann, NP  Active   esomeprazole (NEXIUM) 40 MG capsule [id number]  TAKE ONE CAPSULE ONCE DAILY [name], MD  Active   ezetimibe (ZETIA) 10 MG tablet [id number]  TAKE ONE TABLET ONCE DAILY [name], MD  Active   fenofibrate (TRICOR) 145 MG tablet [id number]  Take 1 tablet (145 mg total) by mouth daily. [name], MD  Active   Ferrous Fumarate-Folic Acid (HEMOCYTE-F) 324-1 MG TABS [id number]  1 tablet po daily [name], MD  Active            Med Note Clydie Braun, [place] B   Wed Jul 10, 2022  2:13 PM) Stopped because caused bloating and constipation  fluticasone (FLONASE) 50 MCG/ACT nasal spray [id number]  Place 2 sprays into both nostrils daily. [name], MD  Active   furosemide (LASIX) 20 MG tablet [id number]  TAKE ONE TABLET ONCE DAILY [name], MD  Active   glucose blood (ACCU-CHEK AVIVA) test strip [id number]  Twice daily (please use brand that patient insurance covers) [name], MD  Active   insulin degludec (TRESIBA FLEXTOUCH) 200 UNIT/ML FlexTouch Pen [id number]  Inject 12-20 Units into the skin daily.  Patient taking differently: Inject 12 Units into the skin daily.   Carlus Pavlov, MD  Active   Insulin Pen Needle 32G X 4 MM MISC [id number]  Use 1x a day  Carlus Pavlov, MD  Active   levothyroxine (SYNTHROID) 200 MCG tablet [id number]  Take 1 tablet (200 mcg total) by mouth daily. Along with tablet for a total daily dose of . [name], MD  Active   levothyroxine (SYNTHROID) 50 MCG tablet [id number]  TAKE ONE TABLET ONCE DAILY [name], MD  Active   loratadine (CLARITIN) 10 MG tablet [id number]  Take 1 tablet (10 mg total) by mouth daily. [name], MD  Active   losartan (COZAAR) 100 MG tablet [id number]  TAKE ONE TABLET DAILY Sandford Craze, NP  Active  metFORMIN (GLUCOPHAGE-XR) 500 MG 24 hr tablet [id number]  Take 4 tablets (2,000 mg total) by mouth daily with supper. Carlus Pavlov, MD  Active   montelukast (SINGULAIR) 10 MG tablet [id number]  Take 1 tablet (10 mg total) by mouth at bedtime. [name], MD  Active   rosuvastatin (CRESTOR) 40 MG tablet [id number]  TAKE ONE TABLET DAILY [name], MD  Active   Semaglutide, 1 MG/DOSE, (OZEMPIC, 1 MG/DOSE,) 4 MG/3ML SOPN [id number]  Inject 1 mg into the skin once a week. Carlus Pavlov, MD  Active            Med Note [facility], [place] B   Fri Nov 30, 2021  4:15 PM) Approved to received from [employer] 11/30/21 thru 05/23/2022  Vitamin D, Ergocalciferol, (DRISDOL) 1.25 MG (50000 UNIT) CAPS capsule [id number]  Take 1 capsule (50,000 Units total) by mouth every 7 (seven) days. [name], MD  Active             Patient Active Problem List   Diagnosis Date Noted   Osteoporosis 10/24/2022   Asthma 06/11/2022   IDA (iron deficiency anemia) 04/30/2022   Lower GI bleed 04/29/2022   Allergic rhinitis 11/09/2021   Hx of papillary thyroid carcinoma 07/19/2021   Poorly controlled type 2 diabetes mellitus with circulatory disorder (HCC) 07/19/2021   Atypical chest pain 07/16/2021   Urinary incontinence 07/16/2021   Hypothyroidism 03/20/2021   Nausea 12/06/2020   Muscle cramps 12/06/2020   H/O thyroidectomy 08/30/2020   CSF leak from nose 01/13/2020    Uncontrolled type 2 diabetes mellitus with hyperglycemia (HCC) 04/14/2018   Vitamin D deficiency 03/30/2018   Dental infection 03/29/2018   Dysphagia 12/28/2017   Cough 12/28/2017   Peripheral neuropathy 09/22/2017   Lipoma 09/22/2017   Lymphedema 05/22/2017   Asthmatic bronchitis, mild intermittent, uncomplicated 04/22/2017   Breast cancer (HCC) 02/17/2017   Dysuria 02/17/2017   Insomnia 10/08/2016   Acquired dilation of ascending aorta and aortic root (HCC)    Port catheter in place 08/01/2016   Malignant neoplasm of upper-outer quadrant of left female breast (HCC) 05/14/2016   PVC's (premature ventricular contractions) 05/02/2016   Neck mass 04/21/2016   LVH (left ventricular hypertrophy) due to hypertensive disease, with heart failure (HCC) 04/18/2016   Tonsillar hypertrophy 07/07/2015   Back pain 10/02/2014   Obesity 07/11/2014   S/P right TKA 05/10/2014   S/P knee replacement 05/10/2014   Atrial tachycardia, paroxysmal 02/22/2014   Type 2 diabetes mellitus with hyperglycemia (HCC) 12/31/2013   Chronic diastolic CHF (congestive heart failure) (HCC) 11/22/2013   CAD (coronary artery disease), native coronary artery 11/22/2013   Dyspnea on exertion 11/03/2013   Undiagnosed cardiac murmurs 09/05/2013   Preventative health care 09/05/2013   Musculoskeletal pain 11/10/2012   Vasomotor rhinitis 04/05/2012   Obstructive sleep apnea 04/05/2012   Anemia 04/05/2012   Elevated LFTs 04/05/2012   OA (osteoarthritis) of knee 04/05/2012   Hernia 10/13/2010   Hyperlipidemia 09/11/2007   Anxiety and depression 09/11/2007   Essential hypertension 09/11/2007   GERD 09/11/2007   RENAL CALCULUS 09/11/2007   RENAL CYST 09/11/2007   COLONIC POLYPS, HX OF 09/11/2007     Medication Assistance:   Ozempic and [place] obtained through [employer]  medication assistance program.  Enrollment ends 06/24/2023   Assessment / Plan: Type 2 DM - A1c at goal but recently has experienced lows.   Continue lower dose of Tresiba 8 units daily Continue Ozempic 1mg  weekly and metformin ER 500mg   4 tabs daily.  Recommended she change sensor today. Call office is she continues to have blood glucose < 70.  Reviewed how to treat hypoglycemia and steps to prevent hypoglycemia.   Medication Management - reviewed med list and refill history.  Discussed adherence Called pharmacy to see which medications needed update Rxs. Updates maintenance meds to last until next appt with PCP 02/27/2023  Meds ordered this encounter  Medications   losartan (COZAAR) 100 MG tablet    Sig: Take 1 tablet (100 mg total) by mouth daily.    Dispense:  100 tablet    Refill:  1    Profile until patient requests.   metFORMIN (GLUCOPHAGE-XR) 500 MG 24 hr tablet    Sig: Take 4 tablets (2,000 mg total) by mouth daily with supper.    Dispense:  400 tablet    Refill:  1   furosemide (LASIX) 20 MG tablet    Sig: Take 1 tablet (20 mg total) by mouth daily.    Dispense:  100 tablet    Refill:  1   diltiazem (CARDIZEM CD) 120 MG 24 hr capsule    Sig: Take 1 capsule (120 mg total) by mouth daily.    Dispense:  100 capsule    Refill:  1   escitalopram (LEXAPRO) 20 MG tablet    Sig: TAKE ONE TABLET BY MOUTH EVERY MORNING AND ONE-HALF TABLET EVERY EVENING    Dispense:  150 tablet    Refill:  1      Follow Up:  Telephone follow up appointment with care management team member scheduled for:  1 month - will check Continuous Glucose Monitor report in 2 days to see if any lows with new sensor.    Henrene Pastor, PharmD Clinical Pharmacist [facility]  - [facility] [phone]

## 2022-11-05 NOTE — Telephone Encounter (Signed)
It was sent to the [facility] pharmacy which it was $300.  I had them cancel it and I sent it to [facility].  She has an appointment for 11/20/22.

## 2022-11-06 ENCOUNTER — Other Ambulatory Visit (HOSPITAL_COMMUNITY): Payer: Self-pay

## 2022-11-08 ENCOUNTER — Other Ambulatory Visit (HOSPITAL_COMMUNITY): Payer: Self-pay

## 2022-11-12 ENCOUNTER — Other Ambulatory Visit (HOSPITAL_COMMUNITY): Payer: Self-pay

## 2022-11-13 ENCOUNTER — Other Ambulatory Visit: Payer: Self-pay

## 2022-11-13 ENCOUNTER — Other Ambulatory Visit (HOSPITAL_COMMUNITY): Payer: Self-pay

## 2022-11-20 ENCOUNTER — Telehealth: Payer: Self-pay | Admitting: *Deleted

## 2022-11-20 ENCOUNTER — Telehealth: Payer: Self-pay | Admitting: Family Medicine

## 2022-11-20 ENCOUNTER — Ambulatory Visit (INDEPENDENT_AMBULATORY_CARE_PROVIDER_SITE_OTHER): Payer: Medicare Other

## 2022-11-20 DIAGNOSIS — M810 Age-related osteoporosis without current pathological fracture: Secondary | ICD-10-CM | POA: Diagnosis not present

## 2022-11-20 MED ORDER — DENOSUMAB 60 MG/ML ~~LOC~~ SOSY
60.0000 mg | PREFILLED_SYRINGE | Freq: Once | SUBCUTANEOUS | Status: AC
Start: 2022-11-20 — End: 2022-11-20
  Administered 2022-11-20: 60 mg via SUBCUTANEOUS

## 2022-11-20 NOTE — Telephone Encounter (Addendum)
Spoke with patient and she has tried several troubleshooting tricks recommended on the [place] site with no improvement.  Continuous Glucose Monitor was place when she was in the office today but when I look at Continuous Glucose Monitor report, sensor only worked for about 30 minutes and then no further readings reported.  Will provide patient with sample sensor - Freestyle Libre 3 - LOT: [id number] Exp 02/22/2023.   Patient provided with number to call to request replacement sensor from [employer].

## 2022-11-20 NOTE — Telephone Encounter (Signed)
Pt called stating that her active Glucometer sensor doesn't seem to be working and was wondering if she needs to remove it and replace it with another one

## 2022-11-20 NOTE — Progress Notes (Addendum)
Pt here Prolia. Shot given in left arm subcutaneously. Pt handled well and given information sheet.  Pt advised she will be called in 6 months to schedule next shot.  Pt also asked for help applying Libre. I asked Tammy for help and she helped patient.

## 2022-11-20 NOTE — Telephone Encounter (Signed)
-----   Message from Roxanne Gates, CMA sent at 11/20/2022  9:11 AM EDT ----- Pt received Prolia shot today

## 2022-12-02 ENCOUNTER — Other Ambulatory Visit (HOSPITAL_COMMUNITY): Payer: Self-pay

## 2022-12-20 ENCOUNTER — Other Ambulatory Visit: Payer: Self-pay | Admitting: Family Medicine

## 2022-12-20 DIAGNOSIS — E039 Hypothyroidism, unspecified: Secondary | ICD-10-CM

## 2023-01-15 DIAGNOSIS — G4733 Obstructive sleep apnea (adult) (pediatric): Secondary | ICD-10-CM | POA: Diagnosis not present

## 2023-02-04 DIAGNOSIS — H25043 Posterior subcapsular polar age-related cataract, bilateral: Secondary | ICD-10-CM | POA: Diagnosis not present

## 2023-02-04 DIAGNOSIS — H2513 Age-related nuclear cataract, bilateral: Secondary | ICD-10-CM | POA: Diagnosis not present

## 2023-02-04 DIAGNOSIS — H18413 Arcus senilis, bilateral: Secondary | ICD-10-CM | POA: Diagnosis not present

## 2023-02-04 DIAGNOSIS — H2511 Age-related nuclear cataract, right eye: Secondary | ICD-10-CM | POA: Diagnosis not present

## 2023-02-04 DIAGNOSIS — H25013 Cortical age-related cataract, bilateral: Secondary | ICD-10-CM | POA: Diagnosis not present

## 2023-02-06 ENCOUNTER — Encounter (INDEPENDENT_AMBULATORY_CARE_PROVIDER_SITE_OTHER): Payer: Self-pay

## 2023-02-11 DIAGNOSIS — J310 Chronic rhinitis: Secondary | ICD-10-CM | POA: Diagnosis not present

## 2023-02-15 ENCOUNTER — Other Ambulatory Visit: Payer: Self-pay | Admitting: Family Medicine

## 2023-02-15 ENCOUNTER — Other Ambulatory Visit: Payer: Self-pay | Admitting: Internal Medicine

## 2023-02-17 ENCOUNTER — Telehealth: Payer: Self-pay | Admitting: Family Medicine

## 2023-02-17 NOTE — Telephone Encounter (Addendum)
Patient is asking about shipment of Ozempic 1mg  from [employer] Patient assistance. I called automatic Novo Nordisk patient assistance program line.  Ozempic 1mg  was shipped to [facility] office  Tracking # [id number] - but I want unable to bring up this number.  I tried to submit a reorder but per [employer] it was too soon.  Requested to speak to representative - who confirmed that last shipment for Ozempic 1mg  and Tresiba were shipped to [facility] on 12/17/2022 for 120 day supply. I don't see documentation of that order in EMR.  Will reach out to endocrinology office to see if this delivery is at their office.

## 2023-02-17 NOTE — Telephone Encounter (Signed)
Left message on VM of Dr Charlean Sanfilippo medical assistant, asking if they have a shipment waiting for pick up for [name].  In the meantime, we have only a sample of the 0.25 /0.5mg  pen of Ozmepic. Patient can use this but would need to inject 0.5mg  twice.

## 2023-02-17 NOTE — Telephone Encounter (Signed)
Pt called & requested to speak to Tammy to discuss Ozempic medication. Please call & advise pt.

## 2023-02-18 ENCOUNTER — Telehealth: Payer: Self-pay | Admitting: Internal Medicine

## 2023-02-18 NOTE — Telephone Encounter (Signed)
Patient notified about sample and about message I left for her endo office. Will check back in 2 weeks to see if endo was able to find shipment. If not will try to provide sample until she is due to reorder around 03/09/2023.

## 2023-02-18 NOTE — Telephone Encounter (Signed)
Patient asking if she has received any Ozempic or Tresiba from Patient Assistance. She states she had requested it in June, not showing anything in chart. Please advise

## 2023-02-19 NOTE — Telephone Encounter (Signed)
I also called and spoke with the patient to let her know that her Ozempic is here but I did not see any [place]. She will be by to pick it up.

## 2023-02-19 NOTE — Telephone Encounter (Signed)
Patient picked up Patient Assistance Ozempic, [name] gave patient medication

## 2023-02-21 ENCOUNTER — Other Ambulatory Visit: Payer: Self-pay | Admitting: Family Medicine

## 2023-02-25 ENCOUNTER — Encounter: Payer: Medicare Other | Admitting: Family Medicine

## 2023-02-26 ENCOUNTER — Telehealth: Payer: Self-pay

## 2023-02-26 ENCOUNTER — Encounter: Payer: Self-pay | Admitting: Pharmacist

## 2023-02-26 ENCOUNTER — Ambulatory Visit: Payer: Medicare Other | Admitting: Internal Medicine

## 2023-02-26 ENCOUNTER — Other Ambulatory Visit: Payer: Self-pay | Admitting: Pharmacist

## 2023-02-26 MED ORDER — FREESTYLE LIBRE 3 PLUS SENSOR MISC: 1.0000 | 2 refills | Status: DC

## 2023-02-26 NOTE — Telephone Encounter (Signed)
Sent PA team message

## 2023-02-26 NOTE — Telephone Encounter (Signed)
Notified by patient's pharmacy that the [place] 3 sensors are on backorder. They are able to get Libre 3 Plus.  She should be able to convert to the [place] 3 Plus without any issues since it uses the same phone app.   The Libre 3 Plus will last 15 days instead of 14.  Patient called to notify that she will be getting the [facility] 3 Plus and sent Rx to her pharmacy.

## 2023-02-26 NOTE — Progress Notes (Signed)
Pharmacy Note  02/26/2023 Name: [name] MRN: [id number] DOB: 08-Jun-1951  Subjective: [name] is a 72 y.o. year old female who is a primary care patient of [name], MD. Clinical Pharmacist Practitioner referral was placed to assist with diabetes / medication management.    Engaged with patient by telephone for follow up visit today. Also spoke with her pharmacy to coordinate care.  Type 2 DM:  Current therapy: Ozempic 2mg  weekly, Tresiba 8 to 12 units daily and metformin ER 500mg  4 tablets daily. Has been approved for medication assistance program for [place] and Ozempic for 2024. (Delivered to endo office)   Patient is using Continuous Glucose Monitor Freestyle Libre 3. However he pharmacy has not been able to get [place] 3 due to being on backorder.      Objective: Review of patient status, including review of consultants reports, laboratory and other test data, was performed as part of comprehensive evaluation and provision of chronic care management services.   Lab Results  Component Value Date   CREATININE 0.75 10/24/2022   CREATININE 0.60 10/09/2022   CREATININE 0.54 04/29/2022    Lab Results  Component Value Date   HGBA1C 6.2 (A) 10/24/2022       Component Value Date/Time   CHOL 126 10/24/2022 1451   CHOL 120 01/15/2022 0753   TRIG 220.0 (H) 10/24/2022 1451   HDL 40.40 10/24/2022 1451   HDL 31 (L) 01/15/2022 0753   CHOLHDL 3 10/24/2022 1451   VLDL 44.0 (H) 10/24/2022 1451   LDLCALC 52 01/15/2022 0753   LDLCALC 67 06/02/2020 1427   LDLDIRECT 66.0 10/24/2022 1451     Clinical ASCVD: Yes  The ASCVD Risk score (Arnett DK, et al., 2019) failed to calculate for the following reasons:   The valid total cholesterol range is 130 to 320 mg/dL    BP Readings from Last 3 Encounters:  10/24/22 128/86  10/24/22 124/86  10/21/22 134/88     Allergies  Allergen Reactions   Oxycodone Other (See Comments)    Does not feel good. Makes  her sleepy.    Medications Reviewed Today   Medications were not reviewed in this encounter     Patient Active Problem List   Diagnosis Date Noted   Osteoporosis 10/24/2022   Asthma 06/11/2022   IDA (iron deficiency anemia) 04/30/2022   Lower GI bleed 04/29/2022   Allergic rhinitis 11/09/2021   Hx of papillary thyroid carcinoma 07/19/2021   Poorly controlled type 2 diabetes mellitus with circulatory disorder (HCC) 07/19/2021   Atypical chest pain 07/16/2021   Urinary incontinence 07/16/2021   Hypothyroidism 03/20/2021   Nausea 12/06/2020   Muscle cramps 12/06/2020   H/O thyroidectomy 08/30/2020   CSF leak from nose 01/13/2020   Uncontrolled type 2 diabetes mellitus with hyperglycemia (HCC) 04/14/2018   Vitamin D deficiency 03/30/2018   Dental infection 03/29/2018   Dysphagia 12/28/2017   Cough 12/28/2017   Peripheral neuropathy 09/22/2017   Lipoma 09/22/2017   Lymphedema 05/22/2017   Asthmatic bronchitis, mild intermittent, uncomplicated 04/22/2017   Breast cancer (HCC) 02/17/2017   Dysuria 02/17/2017   Insomnia 10/08/2016   Acquired dilation of ascending aorta and aortic root (HCC)    Port catheter in place 08/01/2016   Malignant neoplasm of upper-outer quadrant of left female breast (HCC) 05/14/2016   PVC's (premature ventricular contractions) 05/02/2016   Neck mass 04/21/2016   LVH (left ventricular hypertrophy) due to hypertensive disease, with heart failure (HCC) 04/18/2016   Tonsillar hypertrophy  07/07/2015   Back pain 10/02/2014   Obesity 07/11/2014   S/P right TKA 05/10/2014   S/P knee replacement 05/10/2014   Atrial tachycardia, paroxysmal 02/22/2014   Type 2 diabetes mellitus with hyperglycemia (HCC) 12/31/2013   Chronic diastolic CHF (congestive heart failure) (HCC) 11/22/2013   CAD (coronary artery disease), native coronary artery 11/22/2013   Dyspnea on exertion 11/03/2013   Undiagnosed cardiac murmurs 09/05/2013   Preventative health care 09/05/2013    Musculoskeletal pain 11/10/2012   Vasomotor rhinitis 04/05/2012   Obstructive sleep apnea 04/05/2012   Anemia 04/05/2012   Elevated LFTs 04/05/2012   OA (osteoarthritis) of knee 04/05/2012   Hernia 10/13/2010   Hyperlipidemia 09/11/2007   Anxiety and depression 09/11/2007   Essential hypertension 09/11/2007   GERD 09/11/2007   RENAL CALCULUS 09/11/2007   RENAL CYST 09/11/2007   COLONIC POLYPS, HX OF 09/11/2007     Medication Assistance:   Ozempic and [place] obtained through [employer]  medication assistance program.  Enrollment ends 06/24/2023 . Patient endorses that her endo office did have supply of Ozempic for her  and she was notified 02/18/2023  Assessment / Plan: Type 2 DM - A1c at goal  Continue lTresiba 8 units daily Continue Ozempic 2mg  weekly and metformin ER 500mg  4 tabs daily.  Changed Libre 3 to the new [place] 3 plus sensor. Patient educated about difference. She can continue to use the same app on her phone to monitor with the [facility] 3 Plus.  Need to updated prior authorization for [facility] 3 Plus - submitted to Cover My Meds Key# [id number]    Follow Up:  Telephone follow up appointment with care management team member scheduled for:  October 2024 to assess for 2025 medication assistance program and check Continuous Glucose Monitor .    Henrene Pastor, PharmD Clinical Pharmacist [facility]  - [facility] [phone]

## 2023-03-05 ENCOUNTER — Encounter: Payer: Self-pay | Admitting: Family Medicine

## 2023-03-05 ENCOUNTER — Ambulatory Visit (INDEPENDENT_AMBULATORY_CARE_PROVIDER_SITE_OTHER): Payer: Medicare Other | Admitting: Family Medicine

## 2023-03-05 ENCOUNTER — Other Ambulatory Visit (HOSPITAL_COMMUNITY): Payer: Self-pay

## 2023-03-05 VITALS — BP 157/84 | HR 70 | Ht 66.0 in | Wt 287.0 lb

## 2023-03-05 DIAGNOSIS — E1165 Type 2 diabetes mellitus with hyperglycemia: Secondary | ICD-10-CM | POA: Diagnosis not present

## 2023-03-05 DIAGNOSIS — E559 Vitamin D deficiency, unspecified: Secondary | ICD-10-CM

## 2023-03-05 DIAGNOSIS — I10 Essential (primary) hypertension: Secondary | ICD-10-CM | POA: Diagnosis not present

## 2023-03-05 DIAGNOSIS — Z23 Encounter for immunization: Secondary | ICD-10-CM

## 2023-03-05 DIAGNOSIS — D500 Iron deficiency anemia secondary to blood loss (chronic): Secondary | ICD-10-CM

## 2023-03-05 DIAGNOSIS — E7800 Pure hypercholesterolemia, unspecified: Secondary | ICD-10-CM | POA: Diagnosis not present

## 2023-03-05 DIAGNOSIS — Z794 Long term (current) use of insulin: Secondary | ICD-10-CM

## 2023-03-05 DIAGNOSIS — F32A Depression, unspecified: Secondary | ICD-10-CM

## 2023-03-05 DIAGNOSIS — Z0000 Encounter for general adult medical examination without abnormal findings: Secondary | ICD-10-CM | POA: Diagnosis not present

## 2023-03-05 DIAGNOSIS — F419 Anxiety disorder, unspecified: Secondary | ICD-10-CM

## 2023-03-05 DIAGNOSIS — E890 Postprocedural hypothyroidism: Secondary | ICD-10-CM | POA: Diagnosis not present

## 2023-03-05 LAB — COMPREHENSIVE METABOLIC PANEL
ALT: 42 U/L — ABNORMAL HIGH (ref 0–35)
AST: 59 U/L — ABNORMAL HIGH (ref 0–37)
Albumin: 4.1 g/dL (ref 3.5–5.2)
Alkaline Phosphatase: 71 U/L (ref 39–117)
BUN: 16 mg/dL (ref 6–23)
CO2: 28 meq/L (ref 19–32)
Calcium: 10.2 mg/dL (ref 8.4–10.5)
Chloride: 102 meq/L (ref 96–112)
Creatinine, Ser: 0.75 mg/dL (ref 0.40–1.20)
GFR: 79.62 mL/min (ref >60.00)
Glucose, Bld: 146 mg/dL — ABNORMAL HIGH (ref 70–99)
Potassium: 4.9 meq/L (ref 3.5–5.1)
Sodium: 140 meq/L (ref 135–145)
Total Bilirubin: 0.5 mg/dL (ref 0.2–1.2)
Total Protein: 7.2 g/dL (ref 6.0–8.3)

## 2023-03-05 LAB — CBC WITH DIFFERENTIAL/PLATELET
Basophils Absolute: 0 10*3/uL (ref 0.0–0.1)
Basophils Relative: 0.3 % (ref 0.0–3.0)
Eosinophils Absolute: 0 10*3/uL (ref 0.0–0.7)
Eosinophils Relative: 0.3 % (ref 0.0–5.0)
HCT: 41.3 % (ref 36.0–46.0)
Hemoglobin: 13 g/dL (ref 12.0–15.0)
Lymphocytes Relative: 19.7 % (ref 12.0–46.0)
Lymphs Abs: 1.1 10*3/uL (ref 0.7–4.0)
MCHC: 31.4 g/dL (ref 30.0–36.0)
MCV: 92 fl (ref 78.0–100.0)
Monocytes Absolute: 0.4 10*3/uL (ref 0.1–1.0)
Monocytes Relative: 6.2 % (ref 3.0–12.0)
Neutro Abs: 4.2 10*3/uL (ref 1.4–7.7)
Neutrophils Relative %: 73.5 % (ref 43.0–77.0)
Platelets: 223 10*3/uL (ref 150.0–400.0)
RBC: 4.49 Mil/uL (ref 3.87–5.11)
RDW: 17.1 % — ABNORMAL HIGH (ref 11.5–15.5)
WBC: 5.7 10*3/uL (ref 4.0–10.5)

## 2023-03-05 LAB — LIPID PANEL
Cholesterol: 106 mg/dL (ref 0–200)
HDL: 38.9 mg/dL — ABNORMAL LOW (ref >39.00)
LDL Cholesterol: 38 mg/dL (ref 0–99)
NonHDL: 66.76
Total CHOL/HDL Ratio: 3
Triglycerides: 145 mg/dL (ref 0.0–149.0)
VLDL: 29 mg/dL (ref 0.0–40.0)

## 2023-03-05 LAB — HEMOGLOBIN A1C: Hgb A1c MFr Bld: 7.4 % — ABNORMAL HIGH (ref 4.6–6.5)

## 2023-03-05 LAB — VITAMIN D 25 HYDROXY (VIT D DEFICIENCY, FRACTURES): VITD: 7.69 ng/mL — ABNORMAL LOW (ref 30.00–100.00)

## 2023-03-05 LAB — IBC + FERRITIN
Ferritin: 14.7 ng/mL (ref 10.0–291.0)
Iron: 65 ug/dL (ref 42–145)
Saturation Ratios: 11.4 % — ABNORMAL LOW (ref 20.0–50.0)
TIBC: 568.4 ug/dL — ABNORMAL HIGH (ref 250.0–450.0)
Transferrin: 406 mg/dL — ABNORMAL HIGH (ref 212.0–360.0)

## 2023-03-05 LAB — MICROALBUMIN / CREATININE URINE RATIO
Creatinine,U: 72.2 mg/dL
Microalb Creat Ratio: 25 mg/g (ref 0.0–30.0)
Microalb, Ur: 18.1 mg/dL — ABNORMAL HIGH (ref 0.0–1.9)

## 2023-03-05 LAB — TSH: TSH: 1.73 u[IU]/mL (ref 0.35–5.50)

## 2023-03-05 NOTE — Telephone Encounter (Signed)
Reviewed prior authorization in Cover My Meds Key: [id number]  FreeStyle Libre 3 Plus Sensor [employer] Medicare Part D Electronic Prior Authorization Form (2017 NCPDP) Original Claim Info 70,79 "CGMs covered at [facility] (DEXCOM, LIBRE 2LIBRE3, LIBRE 14,) [employer]; contactDME Provider"Consider 100 DS for same copay as 90 DSPlan ExclusionRefill Payable on or after 09/20/24DUR-Refill too Soon  Looks like it was denied for refill too soon and/or needs to be processed on Medicare Part B.  She has been getting the [place] 3 regular sensors at her retail pharmacy so this rejection is odd.  [facility]. They were able to get [place] 3 sensors in so they dispensed 2 boxes to her today.   Will plan for now to keep the [place] 3 sensors on her med list. Hopefully won't have any future issues with supply chain.

## 2023-03-05 NOTE — Telephone Encounter (Addendum)
Per test claim, Freestyle [employer] and Reader are covered, no PA submitted at this time.  Reader:   Sensors:

## 2023-03-05 NOTE — Assessment & Plan Note (Signed)
Not currently on supplementation  Labs today  

## 2023-03-05 NOTE — Assessment & Plan Note (Signed)
Lab Results  Component Value Date   HGBA1C 6.2 (A) 10/24/2022   Previously stable. Repeat labs today. Continue current regimen and lifestyle measures.

## 2023-03-05 NOTE — Assessment & Plan Note (Signed)
Medication management: rosuvastatin 40 mg daily  Lifestyle factors for lowering cholesterol include: Diet therapy - heart-healthy diet rich in fruits, veggies, fiber-rich whole grains, lean meats, chicken, fish (at least twice a week), fat-free or 1% dairy products; foods low in saturated/trans fats, cholesterol, sodium, and sugar. Mediterranean diet has shown to be very heart healthy. Regular exercise - recommend at least 30 minutes a day, 5 times per week Weight management  Repeat CMP and lipid panel today

## 2023-03-05 NOTE — Progress Notes (Signed)
Complete physical exam  Patient: [name]   DOB: 07-Dec-1950   72 y.o. Female  MRN: [id number]  Subjective:    Chief Complaint  Patient presents with   Annual Exam    Sharmon Azaline Mouch is a 72 y.o. female who presents today for a complete physical exam. She reports consuming a  Vegan  diet.  She has started silver sneakers twice a week  She generally feels well. She reports sleeping well. She does not have additional problems to discuss today.   Currently lives with: alone Acute concerns or interim problems since last visit: no  Vision concerns: no Dental concerns: no (04/02/23 and 04/16/23 schedule cataracts surgeries)   Patient denies ETOH use. Patient denies nicotine use. Patient denies illegal substance use.        Most recent fall risk assessment:    03/05/2023   10:53 AM  Fall Risk   Falls in the past year? 0  Number falls in past yr: 0  Injury with Fall? 0  Risk for fall due to : No Fall Risks  Follow up Falls evaluation completed     Most recent depression screenings:    03/05/2023   10:53 AM 10/24/2022    2:05 PM  PHQ 2/9 Scores  PHQ - 2 Score 2 0  PHQ- 9 Score 6 0            Patient Care Team: [name], MD as PCP - General (Family Medicine) Quintella Reichert, MD as PCP - Cardiology (Cardiology) Claud Kelp, MD as Consulting Physician (General Surgery) Serena Croissant, MD as Consulting Physician (Hematology and Oncology) Antony Blackbird, MD as Consulting Physician (Radiation Oncology) Loa Socks, NP as Nurse Practitioner (Hematology and Oncology) Henrene Pastor, RPH-CPP (Pharmacist)   Outpatient Medications Prior to Visit  Medication Sig   Accu-Chek Softclix Lancets lancets Use to check blood glucose up to twice a day (Dx: Type 2 DM with long term insulin use E11.57, Z79.4)   acetaminophen (TYLENOL) 500 MG tablet Take 1,000 mg by mouth 2 (two) times daily as needed for moderate pain or headache.   albuterol  (VENTOLIN HFA) 108 (90 Base) MCG/ACT inhaler Inhale 2 puffs every 4-6 hours as needed - rescue.   anastrozole (ARIMIDEX) 1 MG tablet Take 1 tablet (1 mg total) by mouth daily.   aspirin EC 81 MG tablet Take 81 mg by mouth daily. Swallow whole.   blood glucose meter kit and supplies Dispense based on patient and insurance preference. Use up to four times daily as directed. (FOR ICD-10 E10.9, E11.9).   budesonide-formoterol (SYMBICORT) 160-4.5 MCG/ACT inhaler Inhale 2 puffs into the lungs 2 (two) times daily. As needed   busPIRone (BUSPAR) 7.5 MG tablet TAKE ONE TABLET BY MOUTH THREE TIMES DAILY AS NEEDED FOR ANXIETY   carvedilol (COREG) 25 MG tablet TAKE ONE TABLET TWICE DAILY WITH MEAL(S)   Continuous Glucose Sensor (FREESTYLE LIBRE 3 PLUS SENSOR) MISC 1 each by Does not apply route See admin instructions. Change sensor every 15 days. Use to check blood glucose continuously.   diltiazem (CARDIZEM CD) 120 MG 24 hr capsule Take 1 capsule (120 mg total) by mouth daily.   escitalopram (LEXAPRO) 20 MG tablet TAKE ONE TABLET BY MOUTH EVERY MORNING AND ONE-HALF TABLET EVERY EVENING   esomeprazole (NEXIUM) 40 MG capsule TAKE ONE CAPSULE ONCE DAILY   ezetimibe (ZETIA) 10 MG tablet TAKE ONE TABLET ONCE DAILY   fenofibrate (TRICOR) 145 MG tablet Take 1 tablet (145 mg  total) by mouth daily.   Ferrous Fumarate-Folic Acid (HEMOCYTE-F) 324-1 MG TABS 1 tablet po daily   fluticasone (FLONASE) 50 MCG/ACT nasal spray Place 2 sprays into both nostrils daily.   furosemide (LASIX) 20 MG tablet Take 1 tablet (20 mg total) by mouth daily.   glucose blood (ACCU-CHEK AVIVA) test strip Twice daily (please use brand that patient insurance covers)   GNP ULTICARE PEN NEEDLES 32G X 4 MM MISC AS DIRECTED DAILY   insulin degludec (TRESIBA FLEXTOUCH) 200 UNIT/ML FlexTouch Pen Inject 12-20 Units into the skin daily. (Patient taking differently: Inject 8 Units into the skin daily.)   levothyroxine (SYNTHROID) 200 MCG tablet TAKE 1  TABLET DAILY ALONG WITH 50 MCG TABLET FOR 250 MCG TOTAL   levothyroxine (SYNTHROID) 50 MCG tablet Take 1 tablet (50 mcg total) by mouth daily before breakfast. Take with tablet daily to equal total   loratadine (CLARITIN) 10 MG tablet Take 1 tablet (10 mg total) by mouth daily.   losartan (COZAAR) 100 MG tablet Take 1 tablet (100 mg total) by mouth daily.   metFORMIN (GLUCOPHAGE-XR) 500 MG 24 hr tablet Take 4 tablets (2,000 mg total) by mouth daily with supper.   montelukast (SINGULAIR) 10 MG tablet Take 1 tablet (10 mg total) by mouth at bedtime.   rosuvastatin (CRESTOR) 40 MG tablet Take 1 tablet (40 mg total) by mouth daily.   Semaglutide, 1 MG/DOSE, (OZEMPIC, 1 MG/DOSE,) 4 MG/3ML SOPN Inject 1 mg into the skin once a week.   Vitamin D, Ergocalciferol, (DRISDOL) 1.25 MG (50000 UNIT) CAPS capsule Take 1 capsule (50,000 Units total) by mouth every 7 (seven) days.   No facility-administered medications prior to visit.    ROS All review of systems negative except what is listed in the HPI        Objective:     BP (!) 157/84   Pulse 70   Ht 5\' 6"  (1.676 m)   Wt 287 lb (130.2 kg)   SpO2 96%   BMI 46.32 kg/m    Physical Exam Vitals reviewed.  Constitutional:      General: She is not in acute distress.    Appearance: Normal appearance. She is obese. She is not ill-appearing.  HENT:     Head: Normocephalic and atraumatic.     Right Ear: Tympanic membrane normal.     Left Ear: Tympanic membrane normal.     Nose: Nose normal.     Mouth/Throat:     Mouth: Mucous membranes are moist.     Pharynx: Oropharynx is clear.  Eyes:     Extraocular Movements: Extraocular movements intact.     Conjunctiva/sclera: Conjunctivae normal.     Pupils: Pupils are equal, round, and reactive to light.  Neck:     Vascular: No carotid bruit.  Cardiovascular:     Rate and Rhythm: Normal rate and regular rhythm.     Pulses: Normal pulses.     Heart sounds: Normal heart sounds.   Pulmonary:     Effort: Pulmonary effort is normal.     Breath sounds: Normal breath sounds.  Abdominal:     General: Abdomen is flat. Bowel sounds are normal. There is no distension.     Palpations: Abdomen is soft. There is no mass.     Tenderness: There is no abdominal tenderness. There is no right CVA tenderness, left CVA tenderness, guarding or rebound.     Hernia: A hernia is present. Hernia is present in the ventral area.  Genitourinary:    Comments: Deferred exam Musculoskeletal:        General: Normal range of motion.     Cervical back: Normal range of motion and neck supple. No tenderness.     Right lower leg: No edema.     Left lower leg: No edema.  Lymphadenopathy:     Cervical: No cervical adenopathy.  Skin:    General: Skin is warm and dry.     Capillary Refill: Capillary refill takes less than 2 seconds.  Neurological:     General: No focal deficit present.     Mental Status: She is alert and oriented to person, place, and time. Mental status is at baseline.  Psychiatric:        Mood and Affect: Mood normal.        Behavior: Behavior normal.        Thought Content: Thought content normal.        Judgment: Judgment normal.      No results found for any visits on 03/05/23.     Assessment & Plan:    Routine Health Maintenance and Physical Exam Discussed health promotion and safety including diet and exercise recommendations, dental health, and injury prevention. Tobacco cessation if applicable. Seat belts, sunscreen, smoke detectors, etc.    Immunization History  Administered Date(s) Administered   Fluad Quad(high Dose 65+) 04/21/2019, 03/20/2021   Fluad Trivalent(High Dose 65+) 03/05/2023   Influenza Split 03/24/2012, 03/24/2013   Influenza, High Dose Seasonal PF 04/18/2016, 04/21/2017, 03/24/2018, 04/27/2020   Influenza,inj,Quad PF,6+ Mos 06/21/2022   Influenza-Unspecified 03/21/2014, 03/15/2015, 03/18/2015   Moderna Covid-19 Vaccine Bivalent Booster  33yrs & up 05/07/2021   Moderna SARS-COV2 Booster Vaccination 12/06/2020   Moderna Sars-Covid-2 Vaccination 07/29/2019, 08/27/2019, 04/19/2020   Pneumococcal Conjugate-13 04/18/2016   Pneumococcal Polysaccharide-23 05/22/2017   Tdap 10/02/2021   Zoster Recombinant(Shingrix) 04/21/2019, 06/28/2019   Zoster, Live 04/19/2011    Health Maintenance  Topic Date Due   Diabetic kidney evaluation - Urine ACR  Never done   FOOT EXAM  02/15/2023   COVID-19 Vaccine (5 - 2023-24 season) 03/03/2024 (Originally 02/23/2023)   HEMOGLOBIN A1C  04/26/2023   Medicare Annual Wellness (AWV)  08/15/2023   Diabetic kidney evaluation - eGFR measurement  10/24/2023   OPHTHALMOLOGY EXAM  10/28/2023   MAMMOGRAM  09/15/2024   Colonoscopy  04/16/2029   DTaP/Tdap/Td (2 - Td or Tdap) 10/03/2031   Pneumonia Vaccine 53+ Years old  Completed   INFLUENZA VACCINE  Completed   DEXA SCAN  Completed   Hepatitis C Screening  Completed   Zoster Vaccines- Shingrix  Completed   HPV VACCINES  Aged Out        Problem List Items Addressed This Visit       Active Problems   Hyperlipidemia    Medication management: rosuvastatin 40 mg daily  Lifestyle factors for lowering cholesterol include: Diet therapy - heart-healthy diet rich in fruits, veggies, fiber-rich whole grains, lean meats, chicken, fish (at least twice a week), fat-free or 1% dairy products; foods low in saturated/trans fats, cholesterol, sodium, and sugar. Mediterranean diet has shown to be very heart healthy. Regular exercise - recommend at least 30 minutes a day, 5 times per week Weight management  Repeat CMP and lipid panel today       Relevant Orders   Comprehensive metabolic panel   Lipid panel   Anxiety and depression    Stable. No SI/HI Continue current treatment       Essential hypertension  Blood pressure is not at goal for age and co-morbidities.   Recommendations: continue current treatment, nurse visit in 2 weeks to reassess;  monitor at home - BP goal <130/80 - monitor and log blood pressures at home - check around the same time each day in a relaxed setting - Limit salt to <2000 mg/day - Follow DASH eating plan (heart healthy diet) - limit alcohol to 2 standard drinks per day for men and 1 per day for women - avoid tobacco products - get at least 2 hours of regular aerobic exercise weekly Patient aware of signs/symptoms requiring further/urgent evaluation. Labs updated today.      Relevant Orders   CBC with Differential/Platelet   Comprehensive metabolic panel   Type 2 diabetes mellitus with hyperglycemia (HCC)    Lab Results  Component Value Date   HGBA1C 6.2 (A) 10/24/2022   Previously stable. Repeat labs today. Continue current regimen and lifestyle measures.       Relevant Orders   Hemoglobin A1c   Microalbumin / creatinine urine ratio   Vitamin D deficiency    Supplement and monitor       Relevant Orders   VITAMIN D 25 Hydroxy (Vit-D Deficiency, Fractures)   Hypothyroidism    Previously well controlled Continue Synthroid at current dose  Recheck TSH and adjust Synthroid as indicated       Relevant Orders   TSH   IDA (iron deficiency anemia)    Not currently on supplementation  Labs today       Relevant Orders   IBC + Ferritin   Other Visit Diagnoses     Annual physical exam    -  Primary   Relevant Orders   Hemoglobin A1c   Microalbumin / creatinine urine ratio   VITAMIN D 25 Hydroxy (Vit-D Deficiency, Fractures)   IBC + Ferritin   TSH   Encounter for immunization       Relevant Orders   Flu Vaccine Trivalent High Dose (Fluad) (Completed)      Return in about 2 weeks (around 03/19/2023) for BP check with nurse; routine PCP follow-up in 3 months .     Clayborne Dana, NP

## 2023-03-05 NOTE — Assessment & Plan Note (Signed)
Blood pressure is not at goal for age and co-morbidities.   Recommendations: continue current treatment, nurse visit in 2 weeks to reassess; monitor at home - BP goal <130/80 - monitor and log blood pressures at home - check around the same time each day in a relaxed setting - Limit salt to <2000 mg/day - Follow DASH eating plan (heart healthy diet) - limit alcohol to 2 standard drinks per day for men and 1 per day for women - avoid tobacco products - get at least 2 hours of regular aerobic exercise weekly Patient aware of signs/symptoms requiring further/urgent evaluation. Labs updated today.

## 2023-03-05 NOTE — Assessment & Plan Note (Signed)
Previously well controlled Continue Synthroid at current dose  Recheck TSH and adjust Synthroid as indicated   

## 2023-03-05 NOTE — Assessment & Plan Note (Signed)
Stable. No SI/HI Continue current treatment

## 2023-03-05 NOTE — Addendum Note (Signed)
Addended by: Henrene Pastor B on: 03/05/2023 04:40 PM   Modules accepted: Orders

## 2023-03-05 NOTE — Patient Instructions (Signed)
Blood pressure is not at goal for age and co-morbidities.   Recommendations: continue current treatment, nurse visit in 2 weeks to reassess; monitor at home - BP goal <130/80 - monitor and log blood pressures at home - check around the same time each day in a relaxed setting - Limit salt to <2000 mg/day - Follow DASH eating plan (heart healthy diet) - limit alcohol to 2 standard drinks per day for men and 1 per day for women - avoid tobacco products - get at least 2 hours of regular aerobic exercise weekly Patient aware of signs/symptoms requiring further/urgent evaluation. Labs updated today.

## 2023-03-05 NOTE — Telephone Encounter (Signed)
Message sent to PA team

## 2023-03-05 NOTE — Assessment & Plan Note (Signed)
Supplement and monitor 

## 2023-03-10 ENCOUNTER — Other Ambulatory Visit (HOSPITAL_COMMUNITY): Payer: Self-pay

## 2023-03-10 NOTE — Telephone Encounter (Signed)
When attempting PA, received following message from [facility], our team does not handle medical PA claims. Per test claim in [place], "Dexcom, Freestyle [place] 2, Freestyle [place] 3, and [place] 14  Day" are covered under pharmacy benefits, seems like to get anything else approved, it would have to be a medical PA.

## 2023-03-11 NOTE — Telephone Encounter (Signed)
Disregard previous request for assistance

## 2023-03-11 NOTE — Telephone Encounter (Signed)
Called pt regarding [place] sensors ,and pt stated she did get the [employer] 3  that she had been on so she is good now.

## 2023-03-15 ENCOUNTER — Encounter (HOSPITAL_COMMUNITY): Payer: Self-pay

## 2023-03-19 ENCOUNTER — Telehealth: Payer: Self-pay

## 2023-03-19 ENCOUNTER — Ambulatory Visit: Payer: Medicare Other

## 2023-03-19 NOTE — Telephone Encounter (Signed)
Patient Assistance  Medication:Tresiba  Dosage:u-200 Quantity:1 box   Medication:Ozempic  Dosage:1 mg Quantity:4 boxes  Date received:03/19/23  Arina Torry,RMA  Pt has been notified via detailed vm

## 2023-03-25 ENCOUNTER — Encounter: Payer: Self-pay | Admitting: Family Medicine

## 2023-03-28 ENCOUNTER — Other Ambulatory Visit: Payer: Self-pay | Admitting: Family Medicine

## 2023-04-02 DIAGNOSIS — H2511 Age-related nuclear cataract, right eye: Secondary | ICD-10-CM | POA: Diagnosis not present

## 2023-04-03 DIAGNOSIS — H25013 Cortical age-related cataract, bilateral: Secondary | ICD-10-CM | POA: Diagnosis not present

## 2023-04-03 DIAGNOSIS — H25043 Posterior subcapsular polar age-related cataract, bilateral: Secondary | ICD-10-CM | POA: Diagnosis not present

## 2023-04-10 ENCOUNTER — Other Ambulatory Visit: Payer: Self-pay | Admitting: Family Medicine

## 2023-04-14 ENCOUNTER — Ambulatory Visit: Payer: Medicare Other | Admitting: Pharmacist

## 2023-04-14 VITALS — BP 128/70 | HR 58

## 2023-04-14 DIAGNOSIS — Z7984 Long term (current) use of oral hypoglycemic drugs: Secondary | ICD-10-CM

## 2023-04-14 DIAGNOSIS — I10 Essential (primary) hypertension: Secondary | ICD-10-CM

## 2023-04-14 DIAGNOSIS — E1165 Type 2 diabetes mellitus with hyperglycemia: Secondary | ICD-10-CM | POA: Diagnosis not present

## 2023-04-14 DIAGNOSIS — Z794 Long term (current) use of insulin: Secondary | ICD-10-CM

## 2023-04-14 DIAGNOSIS — Z7985 Long-term (current) use of injectable non-insulin antidiabetic drugs: Secondary | ICD-10-CM

## 2023-04-14 NOTE — Progress Notes (Signed)
Pharmacy Note  04/14/2023 Name: [name] MRN: [id number] DOB: 02/11/1951  Subjective: [name] is a 72 y.o. year old female who is a primary care patient of [name], MD. Clinical Pharmacist Practitioner referral was placed to assist with diabetes / medication management.    Engaged with patient by telephone for follow up visit today. Also spoke with her pharmacy to coordinate care.  Type 2 DM:  Current therapy: Ozempic 1mg  weekly, Tresiba 8 to 12 units daily and metformin ER 500mg  4 tablets daily. Has been approved for medication assistance program for [place] and Ozempic for 2024. (Delivered to endo office)   She is currently holding Ozempic. She had cataract surgery and was told to hold 1 week before first cataract removal. She has continued to hold until after second cataract surgery which is scheduled for Wednesday, October 23rd.   Patient is using Continuous Glucose Monitor Freestyle Libre 3 but her next sensor will be for the [place] 3 plus.  However he pharmacy has not been able to get [place] 3 due to being on backorder.   Date of Download: 04/14/2023 % Time CGM is active: 80% Average Glucose: 178 mg/dL Glucose Management Indicator: 7.6  Glucose Variability: 22.6 (goal <36%) Time in Goal:  - Time in range 70-180: 58% - Time above range: 41% - Time below range: 1% Observed patterns: Blood glucose has increased recently - likely related to holding Ozempic over the last 2 to 3 weeks for surgery.      Hypertension:  Current blood pressure medication: Carvedilol 25mg  twice a day, diltiazem 120mg  daily, furosemide 20mg  daily, losartan 100mg  daily   Blood pressure today at home 128 / 70 HR - 58 bpm  Objective: Review of patient status, including review of consultants reports, laboratory and other test data, was performed as part of comprehensive evaluation and provision of chronic care management services.   Lab Results  Component Value Date    CREATININE 0.75 03/05/2023   CREATININE 0.75 10/24/2022   CREATININE 0.60 10/09/2022    Lab Results  Component Value Date   HGBA1C 7.4 (H) 03/05/2023       Component Value Date/Time   CHOL 106 03/05/2023 1114   CHOL 120 01/15/2022 0753   TRIG 145.0 03/05/2023 1114   HDL 38.90 (L) 03/05/2023 1114   HDL 31 (L) 01/15/2022 0753   CHOLHDL 3 03/05/2023 1114   VLDL 29.0 03/05/2023 1114   LDLCALC 38 03/05/2023 1114   LDLCALC 52 01/15/2022 0753   LDLCALC 67 06/02/2020 1427   LDLDIRECT 66.0 10/24/2022 1451     Clinical ASCVD: Yes  The ASCVD Risk score (Arnett DK, et al., 2019) failed to calculate for the following reasons:   The valid total cholesterol range is 130 to 320 mg/dL    BP Readings from Last 3 Encounters:  04/14/23 128/70  03/05/23 (!) 157/84  10/24/22 128/86     Allergies  Allergen Reactions   Oxycodone Other (See Comments)    Does not feel good. Makes her sleepy.    Medications Reviewed Today     Reviewed by Henrene Pastor, RPH-CPP (Pharmacist) on 04/14/23 at 1127  Med List Status: <None>   Medication Order Taking? Sig Documenting Provider Last Dose Status Informant  Accu-Chek Softclix Lancets lancets [id number] No Use to check blood glucose up to twice a day (Dx: Type 2 DM with long term insulin use E11.57, Z79.4)  Patient not taking: Reported on 04/14/2023   [name], MD Not  Taking Active   acetaminophen (TYLENOL) 500 MG tablet [id number] Yes Take 1,000 mg by mouth 2 (two) times daily as needed for moderate pain or headache. [provider] Taking Active Self  albuterol (VENTOLIN HFA) 108 (90 Base) MCG/ACT inhaler [id number] Yes Inhale 2 puffs every 4-6 hours as needed - rescue. Waymon Budge, MD Taking Active   anastrozole (ARIMIDEX) 1 MG tablet [id number] Yes Take 1 tablet (1 mg total) by mouth daily. Serena Croissant, MD Taking Active   aspirin EC 81 MG tablet [id number] Yes Take 81 mg by mouth daily. Swallow whole. [provider]  Taking Active   blood glucose meter kit and supplies [id number] No Dispense based on patient and insurance preference. Use up to four times daily as directed. (FOR ICD-10 E10.9, E11.9).  Patient not taking: Reported on 04/14/2023   [name], MD Not Taking Active   budesonide-formoterol [facility]) 160-4.5 MCG/ACT inhaler [id number] No Inhale 2 puffs into the lungs 2 (two) times daily. As needed  Patient not taking: Reported on 04/14/2023   [provider] Not Taking Active            Med Note Marius Ditch Apr 14, 2023 11:24 AM)    busPIRone (BUSPAR) 7.5 MG tablet [id number] Yes TAKE ONE TABLET BY MOUTH THREE TIMES DAILY AS NEEDED FOR ANXIETY [name], MD Taking Active   carvedilol (COREG) 25 MG tablet [id number] Yes Take 1 tablet (25 mg total) by mouth 2 (two) times daily with a meal. [name], MD Taking Active   cholecalciferol (VITAMIN D3) 25 MCG (1000 UNIT) tablet [id number] Yes Take 2,000 Units by mouth daily. [provider]  Active   Continuous Glucose Sensor (FREESTYLE LIBRE 3 SENSOR) [place] [id number] Yes 1 each by Does not apply route every 14 (fourteen) days. Use to check blood glucose continuously. Change sensor every 14 days. [provider] Taking Active            Med Note Clydie Braun, Inda Merlin Apr 14, 2023 11:25 AM) Plan to change to [facility] 3 plus  diltiazem (CARDIZEM CD) 120 MG 24 hr capsule [id number] Yes Take 1 capsule (120 mg total) by mouth daily. [name], MD Taking Active   escitalopram (LEXAPRO) 20 MG tablet [id number] Yes TAKE ONE TABLET BY MOUTH EVERY MORNING AND ONE-HALF TABLET EVERY Bonna Gains, MD Taking Active   esomeprazole (NEXIUM) 40 MG capsule [id number] Yes TAKE ONE CAPSULE ONCE DAILY [name], MD Taking Active   ezetimibe (ZETIA) 10 MG tablet [id number] Yes TAKE ONE TABLET ONCE DAILY [name], MD Taking Active   fenofibrate (TRICOR) 145 MG tablet [id number] Yes Take 1 tablet (145 mg  total) by mouth daily. [name], MD Taking Active   ferrous sulfate 325 (65 FE) MG EC tablet [id number] Yes Take 325 mg by mouth every other day. [provider]  Active   fluticasone (FLONASE) 50 MCG/ACT nasal spray [id number] Yes Place 2 sprays into both nostrils daily. [name], MD Taking Active   furosemide (LASIX) 20 MG tablet [id number] Yes Take 1 tablet (20 mg total) by mouth daily. [name], MD Taking Active   glucose blood (ACCU-CHEK AVIVA) test strip [id number] Yes Twice daily (please use brand that patient insurance covers) [name], MD Taking Active   GNP ULTICARE PEN NEEDLES 32G X 4 MM MISC [id number] Yes AS DIRECTED DAILY Carlus Pavlov, MD Taking Active  insulin degludec (TRESIBA FLEXTOUCH) 200 UNIT/ML FlexTouch Pen [id number] Yes Inject 12-20 Units into the skin daily.  Patient taking differently: Inject 8 Units into the skin daily.   Carlus Pavlov, MD Taking Active            Med Note Haze Justin   Tue Nov 05, 2022  8:58 AM) Approved thru [employer] thru 06/24/2023  levothyroxine (SYNTHROID) 200 MCG tablet [id number] Yes TAKE 1 TABLET DAILY ALONG WITH 50 MCG TABLET FOR 250 MCG TOTAL [name], MD Taking Active   levothyroxine (SYNTHROID) 50 MCG tablet [id number] Yes Take 1 tablet (50 mcg total) by mouth daily before breakfast. Take with tablet daily to equal total [name], MD Taking Active   loratadine (CLARITIN) 10 MG tablet [id number] Yes Take 1 tablet (10 mg total) by mouth daily. [name], MD Taking Active   losartan (COZAAR) 100 MG tablet [id number] Yes Take 1 tablet (100 mg total) by mouth daily. [name], MD Taking Active   metFORMIN (GLUCOPHAGE-XR) 500 MG 24 hr tablet [id number] Yes Take 4 tablets (2,000 mg total) by mouth daily with supper. [name], MD Taking Active   montelukast (SINGULAIR) 10 MG tablet [id number] Yes Take 1 tablet (10 mg total) by mouth at bedtime. [name], MD  Taking Active   rosuvastatin (CRESTOR) 40 MG tablet [id number] Yes Take 1 tablet (40 mg total) by mouth daily. [name], MD Taking Active   Semaglutide, 1 MG/DOSE, (OZEMPIC, 1 MG/DOSE,) 4 MG/3ML SOPN [id number] Yes Inject 1 mg into the skin once a week. Carlus Pavlov, MD Taking Active            Med Note Clydie Braun, [place] B   Tue Nov 05, 2022  8:59 AM) Approved to get from [employer] thru 06/24/2023  Vitamin D, Ergocalciferol, (DRISDOL) 1.25 MG (50000 UNIT) CAPS capsule [id number] Yes Take 1 capsule (50,000 Units total) by mouth every 7 (seven) days. [name], MD Taking Active             Patient Active Problem List   Diagnosis Date Noted   Osteoporosis 10/24/2022   Asthma 06/11/2022   IDA (iron deficiency anemia) 04/30/2022   Lower GI bleed 04/29/2022   Arthritis 04/15/2022   History of breast cancer 04/15/2022   History of PAT (paroxysmal atrial tachycardia) 04/15/2022   Orthostatic hypotension 04/15/2022   Pre-syncope 04/15/2022   Seasonal allergies 04/15/2022   Allergic rhinitis 11/09/2021   Hx of papillary thyroid carcinoma 07/19/2021   Poorly controlled type 2 diabetes mellitus with circulatory disorder (HCC) 07/19/2021   Atypical chest pain 07/16/2021   Urinary incontinence 07/16/2021   Hypothyroidism 03/20/2021   Nausea 12/06/2020   Muscle cramps 12/06/2020   H/O thyroidectomy 08/30/2020   CSF leak from nose 01/13/2020   Uncontrolled type 2 diabetes mellitus with hyperglycemia (HCC) 04/14/2018   Vitamin D deficiency 03/30/2018   Dental infection 03/29/2018   Dysphagia 12/28/2017   Cough 12/28/2017   Peripheral neuropathy 09/22/2017   Lipoma 09/22/2017   Lymphedema 05/22/2017   Asthmatic bronchitis, mild intermittent, uncomplicated 04/22/2017   Breast cancer (HCC) 02/17/2017   Dysuria 02/17/2017   Insomnia 10/08/2016   Acquired dilation of ascending aorta and aortic root (HCC)    Port catheter in place 08/01/2016   Malignant neoplasm of  upper-outer quadrant of left female breast (HCC) 05/14/2016   PVC's (premature ventricular contractions) 05/02/2016   Neck mass 04/21/2016   LVH (left ventricular hypertrophy) due  to hypertensive disease, with heart failure (HCC) 04/18/2016   Tonsillar hypertrophy 07/07/2015   Back pain 10/02/2014   Obesity 07/11/2014   S/P right TKA 05/10/2014   S/P knee replacement 05/10/2014   Atrial tachycardia, paroxysmal (HCC) 02/22/2014   Type 2 diabetes mellitus with hyperglycemia (HCC) 12/31/2013   Chronic diastolic CHF (congestive heart failure) (HCC) 11/22/2013   CAD (coronary artery disease), native coronary artery 11/22/2013   Dyspnea on exertion 11/03/2013   Undiagnosed cardiac murmurs 09/05/2013   Preventative health care 09/05/2013   Musculoskeletal pain 11/10/2012   Vasomotor rhinitis 04/05/2012   Obstructive sleep apnea 04/05/2012   Anemia 04/05/2012   Elevated LFTs 04/05/2012   OA (osteoarthritis) of knee 04/05/2012   Hernia 10/13/2010   Hyperlipidemia 09/11/2007   Anxiety and depression 09/11/2007   Essential hypertension 09/11/2007   GERD 09/11/2007   RENAL CALCULUS 09/11/2007   RENAL CYST 09/11/2007   History of colonic polyps 09/11/2007     Medication Assistance:   Ozempic and [place] obtained through [employer]  medication assistance program.  Enrollment ends 06/24/2023 . She would like to get thru PCP office for 2025 because it is closer drive for her.   Assessment / Plan: Type 2 DM - A1c at goal  Continue Tresiba 8 to 12 units daily Continue Ozempic 1mg  weekly and metformin ER 500mg  4 tabs daily.  Changed Libre 3 to the new [place] 3 plus sensor. Patient educated about difference. She can continue to use the same app on her phone to monitor with the [facility] 3 Plus.   Patient reminded to start over-the-counter iron supplement and vitamin D 2000u  daily per Hyman Hopes, NP due to low ferritin and serum Vitamin D. She is also to continue to take weekly Vitamin D 50,000  units daily.   Hypertension BP at home was improved tand at goal of < 130/80 today.   Continue current blood pressure medications.    Follow Up:  Telephone follow up appointment with care management team member scheduled for:  November 2024 to follow up blood glucose and 2025 medication assistance program.    Henrene Pastor, PharmD Clinical Pharmacist [facility]  - [facility] [phone]

## 2023-04-16 DIAGNOSIS — H2512 Age-related nuclear cataract, left eye: Secondary | ICD-10-CM | POA: Diagnosis not present

## 2023-04-18 ENCOUNTER — Other Ambulatory Visit: Payer: Self-pay

## 2023-04-18 ENCOUNTER — Encounter: Payer: Self-pay | Admitting: Family Medicine

## 2023-04-18 ENCOUNTER — Telehealth: Payer: Self-pay | Admitting: Family Medicine

## 2023-04-18 NOTE — Telephone Encounter (Signed)
Pt called to schedule a Prolia Injection as she had been called by [facility] about it. After reviewing chart, was unable to locate a VOB for Prolia. Please Advise.

## 2023-04-18 NOTE — Telephone Encounter (Signed)
Error

## 2023-04-19 NOTE — Progress Notes (Deleted)
HPI female never smoker followed for OSA, lung restriction, dyspnea on exertion, asthmatic bronchitis, complicated by chronic sinusitis, anxiety and depression, paroxysmal atrial tachycardia, CAD, breast cancer left, morbid obesity Unattended Home Sleep Test 11/05/13-AHI 68.1/hour, desaturation to 62%, body weight 320 pounds Echocardiogram 12/04/16- Nl EF Office Spirometry 02/06/17-moderate restriction of exhaled volume. FVC 2.02/61%, FEV1 1.75/69%, ratio 0.86, FEF 25-75% 2.45/113% PFT- 03/12/17-moderate obstructive airways disease with significant response to bronchodilator, normal diffusion. FVC 2.25/69%, FEV1 1.88/76%, ratio 0.84, TLC 80%, DLCO 86% PFT 05/10/21- Mild Restriction --------------------------------------------------------------------------------------------.   10/21/22- 72 year old female never smoker followed for OSA, lung restriction, dyspnea on exertion, asthmatic bronchitis, complicated by chronic sinusitis, anxiety and depression, paroxysmal atrial tachycardia, CAD, breast cancer left, morbid obesity, Goiter, CSF leak treated at [facility] with ENT f/u at [facility],  HST 07/17/22- AHI 42.6/ hr, desaturation to 78% with average 89%, body weight 278 lbs    (we advised her to continue CPAP) CPAP auto 5-15/Adapt -Albuterol hfa, Breo 100, singulair Download compliance-  Body weight today- 281 lbs We do not have a download on her CPAP at this time.  She has had 2 CSF leaks repaired and says the surgeons "did not like" the fact that she was using CPAP.  Given her body habitus, Earnest Bailey is not an option.  I suggested we see what could be accomplished with a fitted oral appliance. Her asthmatic bronchitis has been doing very well.  She has not been needing to use her inhalers at all but has them available. CTachest aorta 09/23/22- Non-Vascular: 1. Postsurgical changes after thyroidectomy without complicating features. 2. Unchanged lingular cicatricial atelectasis  04/22/23- 72 year old female  never smoker followed for OSA, lung restriction, dyspnea on exertion, asthmatic bronchitis, complicated by chronic sinusitis, anxiety and depression, paroxysmal atrial tachycardia, CAD, breast cancer left, morbid obesity, Goiter, CSF leak treated at [facility] with ENT f/u at [facility], Referred to Dr Myrtis Ser 10/21/22 for Oral Appliance for OSA, instead of CPAP due to CSF leak. Body weight today- . ROS-see HPI   + = Positive Constitutional:    +weight loss, night sweats, fevers, chills, fatigue, lassitude. HEENT:   + headaches, difficulty swallowing, tooth/dental problems, sore throat,       sneezing, itching, ear ache, + nasal congestion, post nasal drip, snoring CV:    chest pain, orthopnea, PND, swelling in lower extremities, anasarca,                                                  dizziness, palpitations Resp:   + shortness of breath with exertion or at rest.                productive cough,   non-productive cough, coughing up of blood.              change in color of mucus.   wheezing.   Skin:    rash or lesions. GI:  No-   heartburn, indigestion, abdominal pain, nausea, vomiting, diarrhea,                 change in bowel habits, loss of appetite GU: dysuria, change in color of urine, no urgency or frequency.   flank pain. MS:   joint pain, stiffness, decreased range of motion, back pain. Neuro-     nothing unusual Psych:  change in mood or affect.  depression or  anxiety.   memory loss.  OBJ- Physical Exam General- Alert, Oriented, Affect-appropriate, Distress- none acute, + morbidly obese Skin- rash-none, lesions- none, excoriation- none Lymphadenopathy- none Head- atraumatic,             Eyes- Gross vision intact, PERRLA, conjunctivae and secretions clear            Ears- Hearing, canals-normal            Nose-  turbinate edema, no-Septal dev, mucus, polyps, erosion, perforation,            Throat- Mallampati III , mucosa clear , drainage- none, tonsils- atrophic,  stridor Neck-  flexible , trachea midline , thyroid R mass identified as" goiter" , carotid no bruit Chest - symmetrical excursion , unlabored           Heart/CV- RRR , no murmur , no gallop  , no rub, nl s1 s2                           - JVD- none , edema- none, stasis changes- none, varices- none           Lung- clear,  wheeze- none, cough- none , dullness-none, rub- none           Chest wall-  Abd-  Br/ Gen/ Rectal- Not done, not indicated Extrem- cyanosis- none, clubbing, none, atrophy- none, strength- nl Neuro- grossly intact to observation

## 2023-04-21 ENCOUNTER — Other Ambulatory Visit: Payer: Self-pay

## 2023-04-21 DIAGNOSIS — M810 Age-related osteoporosis without current pathological fracture: Secondary | ICD-10-CM

## 2023-04-21 MED ORDER — DENOSUMAB 60 MG/ML ~~LOC~~ SOSY
60.0000 mg | PREFILLED_SYRINGE | Freq: Once | SUBCUTANEOUS | Status: DC
Start: 2023-05-05 — End: 2023-08-21

## 2023-04-21 NOTE — Telephone Encounter (Signed)
Las injection was the end of May- she will be due the end of November.  Referral placed for Prolia. Once PA team has her EOB we can schedule this.

## 2023-04-22 ENCOUNTER — Ambulatory Visit: Payer: Medicare Other | Admitting: Internal Medicine

## 2023-04-23 NOTE — Telephone Encounter (Signed)
Patient came in to office today and picked up 1 Box of [place] and 4 boxes of Ozempic.  Both were patient assistance.

## 2023-04-24 ENCOUNTER — Other Ambulatory Visit: Payer: Self-pay

## 2023-04-24 ENCOUNTER — Other Ambulatory Visit: Payer: Self-pay | Admitting: Family Medicine

## 2023-04-24 NOTE — Telephone Encounter (Signed)
Pharmacy Patient Advocate Encounter  Received notification from  [employer]  that Prior Authorization for Prolia has been APPROVED from 04/24/23 to 04/23/24 for 2 doses   PA #/Case ID/Reference #: [id number]

## 2023-04-24 NOTE — Progress Notes (Signed)
Specialty Pharmacy Refill Coordination Note  [name] is a 72 y.o. female contacted today regarding refills of specialty medication(s) Denosumab   Patient requested Courier to Provider Office   Delivery date: 05/12/23   Verified address: [facility], [address], [place], [address]   Medication will be filled on 05/09/23.

## 2023-04-29 ENCOUNTER — Other Ambulatory Visit (HOSPITAL_COMMUNITY): Payer: Self-pay

## 2023-04-30 ENCOUNTER — Other Ambulatory Visit: Payer: Self-pay | Admitting: Family Medicine

## 2023-04-30 ENCOUNTER — Other Ambulatory Visit: Payer: Self-pay

## 2023-05-01 ENCOUNTER — Other Ambulatory Visit (HOSPITAL_COMMUNITY): Payer: Self-pay

## 2023-05-01 MED ORDER — PROLIA 60 MG/ML ~~LOC~~ SOSY
60.0000 mg | PREFILLED_SYRINGE | Freq: Once | SUBCUTANEOUS | 0 refills | Status: AC
  Filled 2023-05-01: qty 1, 180d supply, fill #0

## 2023-05-01 NOTE — Telephone Encounter (Signed)
Estimated Delivery date: 05/12/23   Will schedule NV once received.  APPROVED from 04/24/23 to 04/23/24 for 2 doses PA #/Case ID/Reference #: [id number]

## 2023-05-05 ENCOUNTER — Telehealth: Payer: Self-pay

## 2023-05-05 ENCOUNTER — Other Ambulatory Visit (HOSPITAL_COMMUNITY): Payer: Self-pay

## 2023-05-05 NOTE — Telephone Encounter (Signed)
Pt ready for scheduling for Prolia on or after : 05/23/23  Out-of-pocket cost due at time of visit: $322  Primary: [employer] - Medicare Prolia co-insurance: 20% Admin fee co-insurance: $20  Secondary: N/A Prolia co-insurance:  Admin fee co-insurance:   Medical Benefit Details: Date Benefits were checked: 05/05/23 Deductible: no/ Coinsurance: 20%/ Admin Fee: $20  Prior Auth: [employer] PA# [id number] Expiration Date: 04/24/2023 to 04/23/2024  # of doses approved: 2  Pharmacy benefit: Copay $300 If patient wants fill through the pharmacy benefit please send prescription to: [facility], and include estimated need by date in rx notes. Pharmacy will ship medication directly to the office.  Patient not eligible for Prolia Copay Card. Copay Card can make patient's cost as little as $25. Link to apply: [email or web address]://[email or web address].[email or web address]  ** This summary of benefits is an estimation of the patient's out-of-pocket cost. Exact cost may very based on individual plan coverage.

## 2023-05-07 ENCOUNTER — Telehealth: Payer: Self-pay | Admitting: Family Medicine

## 2023-05-07 NOTE — Telephone Encounter (Signed)
Pt called and requested to have her Prolia shot. Please advise if and when patient can be scheduled.

## 2023-05-08 ENCOUNTER — Other Ambulatory Visit (HOSPITAL_COMMUNITY): Payer: Self-pay

## 2023-05-09 ENCOUNTER — Other Ambulatory Visit: Payer: Self-pay

## 2023-05-12 ENCOUNTER — Other Ambulatory Visit: Payer: Self-pay

## 2023-05-12 ENCOUNTER — Other Ambulatory Visit (HOSPITAL_COMMUNITY): Payer: Self-pay

## 2023-05-12 NOTE — Telephone Encounter (Signed)
Called patient. Appointment was made for 05/28/2023 at 10:15 am

## 2023-05-12 NOTE — Telephone Encounter (Signed)
Pt has been scheduled- Rx not received in office yet.

## 2023-05-12 NOTE — Telephone Encounter (Signed)
Pt called back by Janetta to let her know that her injection should arrive today. We will call to schedule injection for on/after 05/23/23.

## 2023-05-12 NOTE — Telephone Encounter (Signed)
Pt called back to follow up. Please advise.

## 2023-05-13 ENCOUNTER — Other Ambulatory Visit (HOSPITAL_COMMUNITY): Payer: Self-pay

## 2023-05-13 NOTE — Telephone Encounter (Signed)
Medication received from [facility] and placed in clinic fridge.

## 2023-05-13 NOTE — Telephone Encounter (Signed)
Pt was scheduled for 05/28/23.

## 2023-05-14 ENCOUNTER — Telehealth: Payer: Self-pay | Admitting: Pharmacist

## 2023-05-14 ENCOUNTER — Telehealth: Payer: Medicare Other

## 2023-05-14 NOTE — Telephone Encounter (Signed)
Attempt was made to contact patient by phone today for follow up by Clinical Pharmacist regarding diabetes and medication management. .  Unable to reach patient with home or mobile number. LM on VM with my contact number [phone].

## 2023-05-20 ENCOUNTER — Inpatient Hospital Stay: Payer: Medicare Other | Attending: Hematology and Oncology | Admitting: Hematology and Oncology

## 2023-05-20 VITALS — BP 167/89 | HR 89 | Temp 97.6°F | Resp 18 | Ht 66.0 in | Wt 294.6 lb

## 2023-05-20 DIAGNOSIS — C50412 Malignant neoplasm of upper-outer quadrant of left female breast: Secondary | ICD-10-CM | POA: Insufficient documentation

## 2023-05-20 DIAGNOSIS — Z79811 Long term (current) use of aromatase inhibitors: Secondary | ICD-10-CM | POA: Insufficient documentation

## 2023-05-20 DIAGNOSIS — Z923 Personal history of irradiation: Secondary | ICD-10-CM | POA: Diagnosis not present

## 2023-05-20 DIAGNOSIS — Z170 Estrogen receptor positive status [ER+]: Secondary | ICD-10-CM | POA: Diagnosis not present

## 2023-05-20 NOTE — Assessment & Plan Note (Addendum)
06/21/16: Left lumpectomy: IDC grade 3, 1.1 cm, 1/5 LN Positive with ECE, Margins Neg, LVI Present; Er 30%, PR 0%, Ki 67 40%, Her 2 Positive Ratio 2.71; T1CN1 (Stage 2B)   Patient has multiple comorbidities including history of LVH with congestive heart failure:    Treatment Plan: 1. Adjuvant Taxol with Herceptin And Perjeta  (Last echocardiogram showed an ejection fraction of 55-60%-- repeat on 4/3/2018EF 60-65%) completed 01/09/2017 2. Followed by adjuvant radiation 3. Followed by adjuvant antiestrogen therapy with anastrozole 1 mg daily 7 years (bone density 04/29/2016 T score -1.2) ----------------------------------------------------------------------------------------------------------------------------------------------------- Current Treatment: Anastrozole 1 mg daily started 07/2016-07/2023  Anastrozole toxicities: Denies any hot flashes or myalgias Since she will complete 7 years of antiestrogen therapy in February 2025 she will discontinue it at that time.  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  She now follows at [facility]. Thyroidectomy for goiter; detected thyroid cancer.  Currently on Synthroid 225 mg  Return to clinic on an as-needed basis

## 2023-05-20 NOTE — Progress Notes (Signed)
Patient Care Team: [name], MD as PCP - General (Family Medicine) Quintella Reichert, MD as PCP - Cardiology (Cardiology) Claud Kelp, MD as Consulting Physician (General Surgery) Serena Croissant, MD as Consulting Physician (Hematology and Oncology) Antony Blackbird, MD as Consulting Physician (Radiation Oncology) Axel Filler Larna Daughters, NP as Nurse Practitioner (Hematology and Oncology) Henrene Pastor, RPH-CPP (Pharmacist)  DIAGNOSIS:  Encounter Diagnosis  Name Primary?   Malignant neoplasm of upper-outer quadrant of left breast in female, estrogen receptor positive (HCC) Yes    SUMMARY OF ONCOLOGIC HISTORY: Oncology History  Malignant neoplasm of upper-outer quadrant of left female breast (HCC)  05/13/2016 Initial Diagnosis   Left breast biopsy 3:00: IDC, grade 3, ER 30%, PR 0%, Ki-67 40%, HER-2 positive ratio 2.71, screening detected left breast mass 9 x 7 x 6 mm, axilla negative, T1 BN 0 stage IA clinical stage   06/21/2016 Surgery   Left lumpectomy: IDC grade 3, 1.1 cm, 1/5 LN Positive with ECE, Margins Neg, LVI Present; Er 30%, PR 0%, Ki 67 40%, Her 2 Positive Ratio 2.71; T1CN1 (Stage 2B)   07/25/2016 - 01/09/2017 Chemotherapy   Taxol weekly 12; Herceptin and Perjeta every 3 weeks     10/31/2016 - 11/28/2016 Radiation Therapy   Adjuvant radiation therapy   12/2016 -  Anti-estrogen oral therapy   Anastrozole daily     CHIEF COMPLIANT: Follow-up on anastrozole therapy  HISTORY OF PRESENT ILLNESS:   History of Present Illness   The patient, with a history of breast cancer surgery in 2018, has been on hormone therapy for almost seven years. She reports tolerating the medication well with no side effects such as hot flashes or joint stiffness. She has been diligent with bone density checks and has started on Prolia for bone health, with the next shot due in December. The patient reports no abnormalities or concerns during the breast exam.         ALLERGIES:  is  allergic to oxycodone.  MEDICATIONS:  Current Outpatient Medications  Medication Sig Dispense Refill   Accu-Chek Softclix Lancets lancets Use to check blood glucose up to twice a day (Dx: Type 2 DM with long term insulin use E11.57, Z79.4) (Patient not taking: Reported on 04/14/2023) 100 each 5   acetaminophen (TYLENOL) 500 MG tablet Take 1,000 mg by mouth 2 (two) times daily as needed for moderate pain or headache.     albuterol (VENTOLIN HFA) 108 (90 Base) MCG/ACT inhaler Inhale 2 puffs every 4-6 hours as needed - rescue. 18 g 12   anastrozole (ARIMIDEX) 1 MG tablet Take 1 tablet (1 mg total) by mouth daily. 90 tablet 3   aspirin EC 81 MG tablet Take 81 mg by mouth daily. Swallow whole.     blood glucose meter kit and supplies Dispense based on patient and insurance preference. Use up to four times daily as directed. (FOR ICD-10 E10.9, E11.9). (Patient not taking: Reported on 04/14/2023) 1 each 0   budesonide-formoterol (SYMBICORT) 160-4.5 MCG/ACT inhaler Inhale 2 puffs into the lungs 2 (two) times daily. As needed (Patient not taking: Reported on 04/14/2023)     busPIRone (BUSPAR) 7.5 MG tablet TAKE ONE TABLET BY MOUTH THREE TIMES DAILY AS NEEDED FOR ANXIETY 90 tablet 5   carvedilol (COREG) 25 MG tablet Take 1 tablet (25 mg total) by mouth 2 (two) times daily with a meal. 180 tablet 1   cholecalciferol (VITAMIN D3) 25 MCG (1000 UNIT) tablet Take 2,000 Units by mouth daily.  Continuous Glucose Sensor (FREESTYLE LIBRE 3 SENSOR) MISC 1 each by Does not apply route every 14 (fourteen) days. Use to check blood glucose continuously. Change sensor every 14 days.     diltiazem (CARDIZEM CD) 120 MG 24 hr capsule Take 1 capsule (120 mg total) by mouth daily. 100 capsule 1   escitalopram (LEXAPRO) 20 MG tablet TAKE ONE TABLET BY MOUTH EVERY MORNING AND ONE-HALF TABLET EVERY EVENING 150 tablet 1   esomeprazole (NEXIUM) 40 MG capsule TAKE ONE CAPSULE ONCE DAILY 90 capsule 3   ezetimibe (ZETIA) 10 MG  tablet TAKE ONE TABLET ONCE DAILY 90 tablet 3   fenofibrate (TRICOR) 145 MG tablet Take 1 tablet (145 mg total) by mouth daily. 90 tablet 1   ferrous sulfate 325 (65 FE) MG EC tablet Take 325 mg by mouth every other day.     fluticasone (FLONASE) 50 MCG/ACT nasal spray Place 2 sprays into both nostrils daily. 16 g 6   furosemide (LASIX) 20 MG tablet Take 1 tablet (20 mg total) by mouth daily. 100 tablet 1   glucose blood (ACCU-CHEK AVIVA) test strip Twice daily (please use brand that patient insurance covers) 100 each 5   GNP ULTICARE PEN NEEDLES 32G X 4 MM MISC AS DIRECTED DAILY 100 each 3   insulin degludec (TRESIBA FLEXTOUCH) 200 UNIT/ML FlexTouch Pen Inject 12-20 Units into the skin daily. (Patient taking differently: Inject 8 Units into the skin daily.) 9 mL 3   levothyroxine (SYNTHROID) 200 MCG tablet TAKE 1 TABLET DAILY ALONG WITH 50 MCG TABLET FOR 250 MCG TOTAL 100 tablet 1   levothyroxine (SYNTHROID) 50 MCG tablet Take 1 tablet (50 mcg total) by mouth daily before breakfast. Take with tablet daily to equal total 90 tablet 1   loratadine (CLARITIN) 10 MG tablet Take 1 tablet (10 mg total) by mouth daily. 30 tablet 11   losartan (COZAAR) 100 MG tablet Take 1 tablet (100 mg total) by mouth daily. 100 tablet 1   metFORMIN (GLUCOPHAGE-XR) 500 MG 24 hr tablet Take 4 tablets (2,000 mg total) by mouth daily with supper. 400 tablet 1   montelukast (SINGULAIR) 10 MG tablet Take 1 tablet (10 mg total) by mouth at bedtime. 90 tablet 1   rosuvastatin (CRESTOR) 40 MG tablet Take 1 tablet (40 mg total) by mouth daily. 90 tablet 1   Semaglutide, 1 MG/DOSE, (OZEMPIC, 1 MG/DOSE,) 4 MG/3ML SOPN Inject 1 mg into the skin once a week. 9 mL 3   Vitamin D, Ergocalciferol, (DRISDOL) 1.25 MG (50000 UNIT) CAPS capsule Take 1 capsule (50,000 Units total) by mouth every 7 (seven) days. 4 capsule 4   Current Facility-Administered Medications  Medication Dose Route Frequency Provider Last Rate Last Admin    denosumab (PROLIA) injection 60 mg  60 mg Subcutaneous Once [name], MD        PHYSICAL EXAMINATION: ECOG PERFORMANCE STATUS: 1 - Symptomatic but completely ambulatory  Vitals:   05/20/23 0950  BP: (!) 167/89  Pulse: 89  Resp: 18  Temp: 97.6 F (36.4 C)  SpO2: 100%   Filed Weights   05/20/23 0950  Weight: 294 lb 9.6 oz (133.6 kg)    Physical Exam   BREAST: Examination reveals no abnormalities.      (exam performed in the presence of a chaperone)  LABORATORY DATA:  I have reviewed the data as listed    Latest Ref Rng & Units 03/05/2023   11:14 AM 10/24/2022    2:51 PM 10/09/2022  1:12 PM  CMP  Glucose 70 - 99 mg/dL 409  811    BUN 6 - 23 mg/dL 16  12    Creatinine 9.14 - 1.20 mg/dL 7.82  9.56  2.13   Sodium 135 - 145 mEq/L 140  141    Potassium 3.5 - 5.1 mEq/L 4.9 Hemolysis seen...  4.6    Chloride 96 - 112 mEq/L 102  101    CO2 19 - 32 mEq/L 28  28    Calcium 8.4 - 10.5 mg/dL 08.6  57.8    Total Protein 6.0 - 8.3 g/dL 7.2  7.4    Total Bilirubin 0.2 - 1.2 mg/dL 0.5  0.5    Alkaline Phos 39 - 117 U/L 71  78    AST 0 - 37 U/L 59  55    ALT 0 - 35 U/L 42  42      Lab Results  Component Value Date   WBC 5.7 03/05/2023   HGB 13.0 03/05/2023   HCT 41.3 03/05/2023   MCV 92.0 03/05/2023   PLT 223.0 03/05/2023   NEUTROABS 4.2 03/05/2023    ASSESSMENT & PLAN:  Malignant neoplasm of upper-outer quadrant of left female breast (HCC) 06/21/16: Left lumpectomy: IDC grade 3, 1.1 cm, 1/5 LN Positive with ECE, Margins Neg, LVI Present; Er 30%, PR 0%, Ki 67 40%, Her 2 Positive Ratio 2.71; T1CN1 (Stage 2B)   Patient has multiple comorbidities including history of LVH with congestive heart failure:    Treatment Plan: 1. Adjuvant Taxol with Herceptin And Perjeta  (Last echocardiogram showed an ejection fraction of 55-60%-- repeat on 4/3/2018EF 60-65%) completed 01/09/2017 2. Followed by adjuvant radiation 3. Followed by adjuvant antiestrogen therapy with  anastrozole 1 mg daily 7 years (bone density 04/29/2016 T score -1.2) ----------------------------------------------------------------------------------------------------------------------------------------------------- Current Treatment: Anastrozole 1 mg daily started 07/2016-07/2023  Anastrozole toxicities: Denies any hot flashes or myalgias Since she will complete 7 years of antiestrogen therapy in February 2025 she will discontinue it at that time.  Surveillance:  Mammogram 09/17/2022: Benign, breast density category B Breast exam: 05/20/2023: Benign CT angiogram 10/10/2022: Ascending thoracic artery aneurysm 4.3 cm     hospitalization for CSF leak at [facility] June 2021 and September 2023.  She now follows at [facility]. Thyroidectomy for goiter; detected thyroid cancer.  Currently on Synthroid 225 mg    Bone Health Patient is on Prolia for bone density maintenance, with the next dose due in December. -Continue Prolia as scheduled.  General Health Maintenance -Continue regular mammograms for breast cancer surveillance. -Physical breast exam conducted today with normal findings.      Return to clinic on an as-needed basis     No orders of the defined types were placed in this encounter.  The patient has a good understanding of the overall plan. she agrees with it. she will call with any problems that may develop before the next visit here. Total time spent: 30 mins including face to face time and time spent for planning, charting and co-ordination of care   Tamsen Meek, MD 05/20/23

## 2023-05-21 ENCOUNTER — Other Ambulatory Visit: Payer: Self-pay | Admitting: Family Medicine

## 2023-05-23 ENCOUNTER — Ambulatory Visit: Payer: Medicare Other | Admitting: Hematology and Oncology

## 2023-05-26 ENCOUNTER — Telehealth: Payer: Self-pay

## 2023-05-26 NOTE — Telephone Encounter (Signed)
Sent patient portion of application to med assist team to be printed/mailed out

## 2023-05-26 NOTE — Telephone Encounter (Signed)
Mailed patient Application portion in the mail to Patient.

## 2023-05-28 ENCOUNTER — Ambulatory Visit: Payer: Medicare Other

## 2023-05-28 ENCOUNTER — Telehealth: Payer: Self-pay

## 2023-05-28 DIAGNOSIS — M810 Age-related osteoporosis without current pathological fracture: Secondary | ICD-10-CM

## 2023-05-28 MED ORDER — DENOSUMAB 60 MG/ML ~~LOC~~ SOSY
60.0000 mg | PREFILLED_SYRINGE | Freq: Once | SUBCUTANEOUS | Status: AC
  Administered 2023-05-28: 60 mg via SUBCUTANEOUS

## 2023-05-28 NOTE — Telephone Encounter (Signed)
Prolia received today 05/28/23 Next injection due on/after 11/26/22  CAM not placed yet. Waiting to see what we need to do since injection was not documented on existing CAM. It was reordered. Once instructions are clear, then I will place a new CAM.

## 2023-05-28 NOTE — Progress Notes (Signed)
Pt here for Prolia injection. Injection given in right arm subcutaneous. Pt handled well.

## 2023-06-13 ENCOUNTER — Telehealth: Payer: Self-pay

## 2023-06-13 NOTE — Telephone Encounter (Signed)
PA approved.   Request Reference Number: [id number]. FREESTY LIBR KIT 3 SENSOR is approved through 06/23/2024. Your patient may now fill this prescription and it will be covered. Authorization Expiration Date: 06/23/2024

## 2023-06-13 NOTE — Telephone Encounter (Signed)
PA initiated via Covermymeds; KEY: [id number]. Awaiting determination.

## 2023-06-23 ENCOUNTER — Ambulatory Visit: Payer: Medicare Other | Admitting: Family Medicine

## 2023-07-01 ENCOUNTER — Other Ambulatory Visit: Payer: Self-pay | Admitting: Family Medicine

## 2023-07-01 ENCOUNTER — Telehealth: Payer: Medicare Other | Admitting: Physician Assistant

## 2023-07-01 ENCOUNTER — Other Ambulatory Visit: Payer: Self-pay | Admitting: Hematology and Oncology

## 2023-07-01 DIAGNOSIS — S90811A Abrasion, right foot, initial encounter: Secondary | ICD-10-CM

## 2023-07-01 DIAGNOSIS — E559 Vitamin D deficiency, unspecified: Secondary | ICD-10-CM

## 2023-07-01 DIAGNOSIS — E039 Hypothyroidism, unspecified: Secondary | ICD-10-CM

## 2023-07-01 MED ORDER — CEPHALEXIN 500 MG PO CAPS: 500.0000 mg | ORAL_CAPSULE | Freq: Four times a day (QID) | ORAL | 0 refills | Status: DC

## 2023-07-01 MED ORDER — LEVOTHYROXINE SODIUM 200 MCG PO TABS: 200.0000 ug | ORAL_TABLET | Freq: Every day | ORAL | 0 refills | Status: DC

## 2023-07-01 NOTE — Progress Notes (Signed)
 E-Visit for Simple Cut/Laceration  We are sorry that you have had an injury. Here is how we plan to help!  Based on what you shared with me it looks like you have a simple laceration that does not need to be repaired with stitches or tissue glue.   I have prescribed Cephalexin  500mg  Take 1 capsule four times daily for 5 days.   Rest the foot the best you can, elevate the foot (the higher the better, but comfortable to you), Apply a small compression wrap for swelling, can apply ice to the foot for swelling (15-20 minutes at a time, repeat a few times daily).   HOME CARE: Clean the cut or scrape - Wash it well with soap and water. * avoid using hydrogen peroxide which may cause tissue damage, or impede wound healing.  Stop the bleeding - If your cut or scrape is bleeding, press a clean cloth or bandage firmly on the area for 20 minutes. You can also help slow the bleeding by holding the cut above the level of your heart.   Put a thin layer of Bacitracin antibiotic ointment on the cut or scrape. (this can be purchased at any local pharmacy- ask your pharmacist if you need assistance)   Cover the cut or scrape with a bandage or gauze. Keep the bandage clean and dry. Change the bandage 1 to 2 times every day until your cut or scrape heals.   Watch for signs that your cut or scrape is infected (redness, drainage, pain, warmth, swelling or fever)  Over the next 48 hours your wound should start to improve with less pain, less swelling and less redness. If you should develop increasing pain, swelling, redness, fever, pus from the wound you should be seen immediately to make sure this is not becoming infected.   WOUND CARE: Please keep a layer of antibiotic ointment (bacitracin preferred) on this wound at least twice a day for the next seven days and keep a sterile dressing over top of it. You may gently clean the wound with warm soap and water between dressing changes.  We strongly recommend that  you have a medical provider reevaluate your wound within 2 to 3 days in person to make sure that it is healing appropriately.  Thank you for choosing an e-visit.  Your e-visit answers were reviewed by a board certified advanced clinical practitioner to complete your personal care plan. Depending upon the condition, your plan could have included both over the counter or prescription medications.  Please review your pharmacy choice. Make sure the pharmacy is open so you can pick up prescription now. If there is a problem, you may contact your provider through [employer] and have the prescription routed to another pharmacy.  Your safety is important to us . If you have drug allergies check your prescription carefully.   For the next 24 hours you can use MyChart to ask questions about today's visit, request a non-urgent call back, or ask for a work or school excuse. You will get an email in the next two days asking about your experience. I hope that your e-visit has been valuable and will speed your recovery.     I have spent 5 minutes in review of e-visit questionnaire, review and updating patient chart, medical decision making and response to patient.   [name], PA-C

## 2023-07-03 ENCOUNTER — Emergency Department (HOSPITAL_BASED_OUTPATIENT_CLINIC_OR_DEPARTMENT_OTHER)
Admission: EM | Admit: 2023-07-03 | Discharge: 2023-07-03 | Disposition: A | Discharge status: Home / Self Care | Payer: Medicare Other | Attending: Emergency Medicine | Admitting: Emergency Medicine

## 2023-07-03 ENCOUNTER — Other Ambulatory Visit: Payer: Self-pay

## 2023-07-03 ENCOUNTER — Emergency Department (HOSPITAL_BASED_OUTPATIENT_CLINIC_OR_DEPARTMENT_OTHER): Payer: Medicare Other

## 2023-07-03 DIAGNOSIS — S9031XA Contusion of right foot, initial encounter: Secondary | ICD-10-CM | POA: Diagnosis not present

## 2023-07-03 DIAGNOSIS — L03115 Cellulitis of right lower limb: Secondary | ICD-10-CM | POA: Diagnosis not present

## 2023-07-03 DIAGNOSIS — Z79899 Other long term (current) drug therapy: Secondary | ICD-10-CM | POA: Insufficient documentation

## 2023-07-03 DIAGNOSIS — Z7982 Long term (current) use of aspirin: Secondary | ICD-10-CM | POA: Insufficient documentation

## 2023-07-03 DIAGNOSIS — M7731 Calcaneal spur, right foot: Secondary | ICD-10-CM | POA: Diagnosis not present

## 2023-07-03 DIAGNOSIS — Z794 Long term (current) use of insulin: Secondary | ICD-10-CM | POA: Insufficient documentation

## 2023-07-03 DIAGNOSIS — I10 Essential (primary) hypertension: Secondary | ICD-10-CM | POA: Diagnosis not present

## 2023-07-03 DIAGNOSIS — E119 Type 2 diabetes mellitus without complications: Secondary | ICD-10-CM | POA: Insufficient documentation

## 2023-07-03 DIAGNOSIS — Z7984 Long term (current) use of oral hypoglycemic drugs: Secondary | ICD-10-CM | POA: Diagnosis not present

## 2023-07-03 DIAGNOSIS — M79671 Pain in right foot: Secondary | ICD-10-CM | POA: Diagnosis present

## 2023-07-03 MED ORDER — DOXYCYCLINE HYCLATE 100 MG PO TABS
100.0000 mg | ORAL_TABLET | Freq: Once | ORAL | Status: AC
  Administered 2023-07-03: 100 mg via ORAL
  Filled 2023-07-03: qty 1

## 2023-07-03 MED ORDER — DOXYCYCLINE HYCLATE 100 MG PO CAPS: 100.0000 mg | ORAL_CAPSULE | Freq: Two times a day (BID) | ORAL | 0 refills | Status: DC

## 2023-07-03 NOTE — ED Provider Notes (Signed)
 [facility] AT [facility] Provider Note   CSN: [id number] Arrival date & time: 07/03/23  2024     History  Chief Complaint  Patient presents with   Cellulitis    [name] is a 73 y.o. female.  73 year old female here today with right foot pain.  Patient says 1 week ago she tried to step over her dishwasher door, and scraped the top of her foot.  She says that she hit quite hard.  Over the ensuing days, she had some bruising over the top of the foot, small abrasion, and beginning 2 days ago began to have some redness.  She has a history of diabetes, called her PCP reportedly sent in antibiotics but the patient did not receive them.  No fever, no chills.        Home Medications Prior to Admission medications   Medication Sig Start Date End Date Taking? Authorizing Provider  doxycycline  (VIBRAMYCIN ) 100 MG capsule Take 1 capsule (100 mg total) by mouth 2 (two) times daily. 07/03/23  Yes Mannie Pac T, DO  Accu-Chek Softclix Lancets lancets Use to check blood glucose up to twice a day (Dx: Type 2 DM with long term insulin  use E11.57, Z79.4) Patient not taking: Reported on 04/14/2023 07/10/22   Domenica Harlene LABOR, MD  acetaminophen  (TYLENOL ) 500 MG tablet Take 1,000 mg by mouth 2 (two) times daily as needed for moderate pain or headache.    [provider]  albuterol  (VENTOLIN  HFA) 108 (90 Base) MCG/ACT inhaler Inhale 2 puffs every 4-6 hours as needed - rescue. 06/19/20   Neysa Rama D, MD  anastrozole  (ARIMIDEX ) 1 MG tablet TAKE ONE TABLET DAILY 07/02/23   Odean Potts, MD  aspirin  EC 81 MG tablet Take 81 mg by mouth daily. Swallow whole.    [provider]  blood glucose meter kit and supplies Dispense based on patient and insurance preference. Use up to four times daily as directed. (FOR ICD-10 E10.9, E11.9). Patient not taking: Reported on 04/14/2023 08/08/21   Domenica Harlene LABOR, MD  budesonide -formoterol  (SYMBICORT ) 160-4.5  MCG/ACT inhaler Inhale 2 puffs into the lungs 2 (two) times daily. As needed Patient not taking: Reported on 04/14/2023    [provider]  busPIRone  (BUSPAR ) 7.5 MG tablet TAKE ONE TABLET BY MOUTH THREE TIMES DAILY AS NEEDED FOR ANXIETY 10/07/22   Domenica Harlene LABOR, MD  carvedilol  (COREG ) 25 MG tablet Take 1 tablet (25 mg total) by mouth 2 (two) times daily with a meal. 04/10/23   Domenica Harlene LABOR, MD  cephALEXin  (KEFLEX ) 500 MG capsule Take 1 capsule (500 mg total) by mouth 4 (four) times daily. 07/01/23   Vivienne Delon HERO, PA-C  cholecalciferol (VITAMIN D3) 25 MCG (1000 UNIT) tablet Take 2,000 Units by mouth daily.    [provider]  Continuous Glucose Sensor (FREESTYLE LIBRE 3 SENSOR) MISC 1 each by Does not apply route every 14 (fourteen) days. Use to check blood glucose continuously. Change sensor every 14 days.    [provider]  diltiazem  (CARDIZEM  CD) 120 MG 24 hr capsule Take 1 capsule (120 mg total) by mouth daily. 05/21/23   Domenica Harlene LABOR, MD  escitalopram  (LEXAPRO ) 20 MG tablet Take 1 tablet (20 mg total) by mouth in the morning AND 0.5 tablets (10 mg total) every evening. 05/21/23   Domenica Harlene LABOR, MD  esomeprazole  (NEXIUM ) 40 MG capsule Take 1 capsule (40 mg total) by mouth daily. 07/01/23   Domenica Harlene LABOR, MD  ezetimibe  (ZETIA ) 10 MG tablet Take 1 tablet (10 mg total) by mouth daily. 07/01/23   Domenica Harlene LABOR, MD  fenofibrate  (TRICOR ) 145 MG tablet Take 1 tablet (145 mg total) by mouth daily. 04/10/23   Domenica Harlene LABOR, MD  ferrous sulfate  325 (65 FE) MG EC tablet Take 325 mg by mouth every other day.    [provider]  fluticasone  (FLONASE ) 50 MCG/ACT nasal spray Place 2 sprays into both nostrils daily. 12/22/17   Domenica Harlene LABOR, MD  furosemide  (LASIX ) 20 MG tablet Take 1 tablet (20 mg total) by mouth daily. 05/21/23   Domenica Harlene LABOR, MD  glucose blood (ACCU-CHEK AVIVA) test strip Twice daily (please use brand that patient insurance covers)  07/08/22   Domenica Harlene LABOR, MD  GNP ULTICARE PEN NEEDLES 32G X 4 MM MISC AS DIRECTED DAILY 02/17/23   Trixie File, MD  insulin  degludec (TRESIBA  FLEXTOUCH) 200 UNIT/ML FlexTouch Pen Inject 12-20 Units into the skin daily. Patient taking differently: Inject 8 Units into the skin daily. 02/14/22   Trixie File, MD  levothyroxine  (SYNTHROID ) 200 MCG tablet Take 1 tablet (200 mcg total) by mouth daily before breakfast. Take with a 50mcg tablet daily to equal 250mcg daily 07/01/23   Domenica Harlene LABOR, MD  levothyroxine  (SYNTHROID ) 50 MCG tablet Take 1 tablet (50 mcg total) by mouth daily before breakfast. Take with 200mcg tablet daily to equal 250mcg 07/01/23   Blyth, Stacey A, MD  loratadine  (CLARITIN ) 10 MG tablet Take 1 tablet (10 mg total) by mouth daily. 12/22/17   Domenica Harlene LABOR, MD  losartan  (COZAAR ) 100 MG tablet Take 1 tablet (100 mg total) by mouth daily. 05/21/23   Domenica Harlene LABOR, MD  metFORMIN  (GLUCOPHAGE -XR) 500 MG 24 hr tablet Take 4 tablets (2,000 mg total) by mouth daily with supper. 05/21/23   Domenica Harlene LABOR, MD  montelukast  (SINGULAIR ) 10 MG tablet Take 1 tablet (10 mg total) by mouth at bedtime. 03/28/23   Domenica Harlene LABOR, MD  rosuvastatin  (CRESTOR ) 40 MG tablet Take 1 tablet (40 mg total) by mouth daily. 07/01/23   Domenica Harlene LABOR, MD  Semaglutide , 1 MG/DOSE, (OZEMPIC , 1 MG/DOSE,) 4 MG/3ML SOPN Inject 1 mg into the skin once a week. 09/03/21   Trixie File, MD  Vitamin D , Ergocalciferol , (DRISDOL ) 1.25 MG (50000 UNIT) CAPS capsule Take 1 capsule (50,000 Units total) by mouth every 7 (seven) days. 10/25/22   Domenica Harlene LABOR, MD      Allergies    Oxycodone     Review of Systems   Review of Systems  Physical Exam Updated Vital Signs BP (!) 183/99 (BP Location: Right Arm)   Pulse 78   Temp 98.4 F (36.9 C)   Resp 20   Ht 5' 5 (1.651 m)   Wt 131.5 kg   SpO2 98%   BMI 48.26 kg/m  Physical Exam Vitals reviewed.  Constitutional:      Appearance: Normal appearance.   Cardiovascular:     Rate and Rhythm: Normal rate.  Pulmonary:     Effort: Pulmonary effort is normal.  Skin:    General: Skin is warm.     Comments: Small abrasion over the dorsum of the right foot.  There is mild surrounding erythema, no crepitus or blistering.  Patient does have some bruising in the dependent areas of the foot.  No streaking.  Neurological:     Mental Status: She is alert.     ED Results / Procedures / Treatments  Labs (all labs ordered are listed, but only abnormal results are displayed) Labs Reviewed - No data to display  EKG None  Radiology DG Foot Complete Right Result Date: 07/03/2023 CLINICAL DATA:  Fall, swelling and bruising along top of foot. EXAM: RIGHT FOOT COMPLETE - 3+ VIEW COMPARISON:  12/06/2007 FINDINGS: Soft tissue swelling along the dorsum of the foot. No acute bony abnormality. Specifically, no fracture, subluxation, or dislocation. Joint spaces maintained. Plantar calcaneal spur. IMPRESSION: No acute bony abnormality. Electronically Signed   By: Franky Crease M.D.   On: 07/03/2023 21:17    Procedures Procedures    Medications Ordered in ED Medications  doxycycline  (VIBRA -TABS) tablet 100 mg (100 mg Oral Given 07/03/23 2111)    ED Course/ Medical Decision Making/ A&P                                 Medical Decision Making 73 year old female here today with foot pain and redness.  Differential diagnoses include cellulitis, foot fracture, foot contusion, less likely necrotizing soft tissue infection.  Plan-patient with a clear injury to the area, will obtain plain films.  Does appear to be some early developing cellulitis.  Vital signs normal, patient without systemic signs of infection.  Lab does not meaningfully change management, this will not obtain.  Will likely plan to discharge patient on antibiotics.  Reassessment 9:30 PM-patient's plain films of the foot negative for fracture.  Counseled patient on discussing hypertension with her  PCP.  Will discharge patient.  Amount and/or Complexity of Data Reviewed Radiology: ordered.  Risk Prescription drug management.           Final Clinical Impression(s) / ED Diagnoses Final diagnoses:  Cellulitis of right lower extremity    Rx / DC Orders ED Discharge Orders          Ordered    doxycycline  (VIBRAMYCIN ) 100 MG capsule  2 times daily        07/03/23 2137              Mannie Pac T, DO 07/03/23 2138

## 2023-07-03 NOTE — Discharge Instructions (Addendum)
 Your foot x-ray did not show fracture.  You are being diagnosed with cellulitis.  You can take doxycycline  once in the morning once in the evening for the next 10 days.  Please follow-up with your primary care doctor within 3 to 5 days to have the foot looked at.  Return emergency department if you develop increasing pain, redness in your foot, swelling up your leg, fever or confusion.

## 2023-07-03 NOTE — ED Triage Notes (Signed)
 Pt POV from home reporting redness, bruising and swelling of abrasion on top of R foot after falling on Friday. Sent pictures to PCP, prescribed abx but states she gets meds delivered and it did not show up to house.

## 2023-07-07 ENCOUNTER — Telehealth: Payer: Self-pay

## 2023-07-07 NOTE — Transitions of Care (Post Inpatient/ED Visit) (Signed)
 07/07/2023  Name: [name] MRN: [id number] DOB: [birth year]  Today's TOC FU Call Status: Today's TOC FU Call Status:: Successful TOC FU Call Completed TOC FU Call Complete Date: 07/07/23 Patient's Name and Date of Birth confirmed.  Transition Care Management Follow-up Telephone Call Date of Discharge: 07/03/23 Discharge Facility: [facility]) Type of Discharge: Emergency Department Reason for ED Visit: Other: (Cellulitis of right lower extremity) How have you been since you were released from the hospital?: Better Any questions or concerns?: No  Items Reviewed: Did you receive and understand the discharge instructions provided?: Yes Medications obtained,verified, and reconciled?: Yes (Medications Reviewed) Any new allergies since your discharge?: No Dietary orders reviewed?: NA Do you have support at home?: Yes People in Home: child(ren), dependent  Medications Reviewed Today: Medications Reviewed Today     Reviewed by Lang Avelina PARAS, CMA (Certified Medical Assistant) on 07/07/23 at 1551  Med List Status: <None>   Medication Order Taking? Sig Documenting Provider Last Dose Status Informant  Accu-Chek Softclix Lancets lancets [id number] No Use to check blood glucose up to twice a day (Dx: Type 2 DM with long term insulin  use E11.57, Z79.4)  Patient not taking: Reported on 04/14/2023   Domenica Harlene LABOR, MD Not Taking Active   acetaminophen  (TYLENOL ) 500 MG tablet [id number] No Take 1,000 mg by mouth 2 (two) times daily as needed for moderate pain or headache. [provider] Taking Active Self  albuterol  (VENTOLIN  HFA) 108 (90 Base) MCG/ACT inhaler [id number] No Inhale 2 puffs every 4-6 hours as needed - rescue. Neysa Rama D, MD Taking Active   anastrozole  (ARIMIDEX ) 1 MG tablet [id number]  TAKE ONE TABLET DAILY Odean Potts, MD  Active   aspirin  EC 81 MG tablet [id number] No Take 81 mg by mouth daily. Swallow whole. [provider]  Taking Active   blood glucose meter kit and supplies [id number] No Dispense based on patient and insurance preference. Use up to four times daily as directed. (FOR ICD-10 E10.9, E11.9).  Patient not taking: Reported on 04/14/2023   Domenica Harlene LABOR, MD Not Taking Active   budesonide -formoterol  (SYMBICORT ) 160-4.5 MCG/ACT inhaler [id number] No Inhale 2 puffs into the lungs 2 (two) times daily. As needed  Patient not taking: Reported on 04/14/2023   [provider] Not Taking Active            Med Note JUSTINO MADELIN KATHEE Pablo Apr 14, 2023 11:24 AM)    busPIRone  (BUSPAR ) 7.5 MG tablet [id number] No TAKE ONE TABLET BY MOUTH THREE TIMES DAILY AS NEEDED FOR ANXIETY Domenica Harlene LABOR, MD Taking Active   carvedilol  (COREG ) 25 MG tablet [id number] No Take 1 tablet (25 mg total) by mouth 2 (two) times daily with a meal. Domenica Harlene LABOR, MD Taking Active   cephALEXin  (KEFLEX ) 500 MG capsule [id number]  Take 1 capsule (500 mg total) by mouth 4 (four) times daily. Vivienne Nest M, PA-C  Active   cholecalciferol (VITAMIN D3) 25 MCG (1000 UNIT) tablet [id number]  Take 2,000 Units by mouth daily. [provider]  Active   Continuous Glucose Sensor (FREESTYLE LIBRE 3 SENSOR) [place] [id number] No 1 each by Does not apply route every 14 (fourteen) days. Use to check blood glucose continuously. Change sensor every 14 days. [provider] Taking Active            Med Note JUSTINO MADELIN KATHEE Pablo Apr 14, 2023 11:25 AM) Plan to change to [facility] 3 plus  denosumab  (PROLIA )  injection 60 mg [id number]   Domenica Harlene LABOR, MD  Active   diltiazem  (CARDIZEM  CD) 120 MG 24 hr capsule [id number]  Take 1 capsule (120 mg total) by mouth daily. Domenica Harlene LABOR, MD  Active   doxycycline  (VIBRAMYCIN ) 100 MG capsule [id number]  Take 1 capsule (100 mg total) by mouth 2 (two) times daily. Mannie Fairy DASEN, DO  Active   escitalopram  (LEXAPRO ) 20 MG tablet [id number]  Take 1 tablet (20 mg total) by mouth in the morning AND 0.5  tablets (10 mg total) every evening. Domenica Harlene LABOR, MD  Active   esomeprazole  (NEXIUM ) 40 MG capsule [id number]  Take 1 capsule (40 mg total) by mouth daily. Domenica Harlene LABOR, MD  Active   ezetimibe  (ZETIA ) 10 MG tablet [id number]  Take 1 tablet (10 mg total) by mouth daily. Domenica Harlene LABOR, MD  Active   fenofibrate  (TRICOR ) 145 MG tablet [id number] No Take 1 tablet (145 mg total) by mouth daily. Domenica Harlene LABOR, MD Taking Active   ferrous sulfate  325 (65 FE) MG EC tablet [id number]  Take 325 mg by mouth every other day. [provider]  Active   fluticasone  (FLONASE ) 50 MCG/ACT nasal spray [id number] No Place 2 sprays into both nostrils daily. Domenica Harlene LABOR, MD Taking Active   furosemide  (LASIX ) 20 MG tablet [id number]  Take 1 tablet (20 mg total) by mouth daily. Domenica Harlene LABOR, MD  Active   glucose blood (ACCU-CHEK AVIVA) test strip [id number] No Twice daily (please use brand that patient insurance covers) Domenica Harlene LABOR, MD Taking Active   GNP ULTICARE PEN NEEDLES 32G X 4 MM MISC [id number] No AS DIRECTED DAILY Trixie File, MD Taking Active   insulin  degludec (TRESIBA  FLEXTOUCH) 200 UNIT/ML FlexTouch Pen [id number] No Inject 12-20 Units into the skin daily.  Patient taking differently: Inject 8 Units into the skin daily.   Trixie File, MD Taking Active            Med Note JUSTINO, MADELIN NOVAK   Tue Nov 05, 2022  8:58 AM) Approved thru [employer] thru 06/24/2023  levothyroxine  (SYNTHROID ) 200 MCG tablet [id number]  Take 1 tablet (200 mcg total) by mouth daily before breakfast. Take with a 50mcg tablet daily to equal 250mcg daily Domenica Harlene LABOR, MD  Active   levothyroxine  (SYNTHROID ) 50 MCG tablet [id number]  Take 1 tablet (50 mcg total) by mouth daily before breakfast. Take with 200mcg tablet daily to equal 250mcg Domenica Harlene LABOR, MD  Active   loratadine  (CLARITIN ) 10 MG tablet [id number] No Take 1 tablet (10 mg total) by mouth daily. Domenica Harlene LABOR, MD Taking Active   losartan   (COZAAR ) 100 MG tablet [id number]  Take 1 tablet (100 mg total) by mouth daily. Domenica Harlene LABOR, MD  Active   metFORMIN  (GLUCOPHAGE -XR) 500 MG 24 hr tablet [id number]  Take 4 tablets (2,000 mg total) by mouth daily with supper. Domenica Harlene LABOR, MD  Active   montelukast  (SINGULAIR ) 10 MG tablet [id number] No Take 1 tablet (10 mg total) by mouth at bedtime. Domenica Harlene LABOR, MD Taking Active   rosuvastatin  (CRESTOR ) 40 MG tablet [id number]  Take 1 tablet (40 mg total) by mouth daily. Domenica Harlene LABOR, MD  Active   Semaglutide , 1 MG/DOSE, (OZEMPIC , 1 MG/DOSE,) 4 MG/3ML SOPN [id number] No Inject 1 mg into the skin once a week. Trixie File, MD Taking Active            Med Note Elba, TAMMY B   Tue  Nov 05, 2022  8:59 AM) Approved to get from [employer] thru 06/24/2023  Vitamin D , Ergocalciferol , (DRISDOL ) 1.25 MG (50000 UNIT) CAPS capsule [id number] No Take 1 capsule (50,000 Units total) by mouth every 7 (seven) days. Domenica Harlene LABOR, MD Taking Active             Home Care and Equipment/Supplies: Were Home Health Services Ordered?: NA Any new equipment or medical supplies ordered?: NA  Functional Questionnaire: Do you need assistance with bathing/showering or dressing?: No Do you need assistance with meal preparation?: No Do you need assistance with eating?: No Do you have difficulty maintaining continence: No Do you need assistance with getting out of bed/getting out of a chair/moving?: No Do you have difficulty managing or taking your medications?: No  Follow up appointments reviewed: PCP Follow-up appointment confirmed?: Yes Date of PCP follow-up appointment?: 07/17/23 Follow-up Provider: Harlene Domenica, MD [facility] Follow-up appointment confirmed?: NA Do you need transportation to your follow-up appointment?: No Do you understand care options if your condition(s) worsen?: Yes-patient verbalized understanding    SIGNATURE Avelina Essex, CMA (AAMA)  [facility]  Program [phone]

## 2023-07-11 ENCOUNTER — Other Ambulatory Visit: Payer: Self-pay | Admitting: Family Medicine

## 2023-07-11 ENCOUNTER — Encounter: Payer: Self-pay | Admitting: Family Medicine

## 2023-07-11 NOTE — Telephone Encounter (Signed)
Copied from CRM [id number]. Topic: Clinical - Medical Advice >> Jul 11, 2023 11:14 AM [name] wrote: Reason for CRM: Patient called in to figure out what to do to get medication advised refill request has beent sent and is waiting on provider to sign order , patient understood but also asked for in the meantime what should she do regarding the medication and her concern, if she should go back to the ER or not / please call [phone]

## 2023-07-11 NOTE — Telephone Encounter (Signed)
Copied from CRM [id number]. Topic: Clinical - Medication Refill >> Jul 11, 2023  9:44 AM Florestine Avers wrote: Most Recent Primary Care Visit:  Provider: Roxanne Gates  Department: [facility]  Visit Type: NURSE VISIT  Date: 05/28/2023  Medication: doxycycline (VIBRAMYCIN) 100 MG capsule  Has the patient contacted their pharmacy? Yes (Agent: If no, request that the patient contact the pharmacy for the refill. If patient does not wish to contact the pharmacy document the reason why and proceed with request.) (Agent: If yes, when and what did the pharmacy advise?)  Is this the correct pharmacy for this prescription? Yes If no, delete pharmacy and type the correct one.  This is the patient's preferred pharmacy:  [facility] [place], [place] - [address] [place] [address] Phone: [phone] Fax: [phone]  [facility] [address] [place] [address] Phone: [phone] Fax: [phone]   Has the prescription been filled recently? Yes  Is the patient out of the medication? Yes  Has the patient been seen for an appointment in the last year OR does the patient have an upcoming appointment? Yes  Can we respond through MyChart? Yes  Agent: Please be advised that Rx refills may take up to 3 business days. We ask that you follow-up with your pharmacy.

## 2023-07-13 ENCOUNTER — Other Ambulatory Visit: Payer: Self-pay | Admitting: Family Medicine

## 2023-07-13 MED ORDER — DOXYCYCLINE HYCLATE 100 MG PO CAPS: 100.0000 mg | ORAL_CAPSULE | Freq: Two times a day (BID) | ORAL | 0 refills | Status: DC

## 2023-07-13 NOTE — Assessment & Plan Note (Signed)
Supplement and monitor 

## 2023-07-13 NOTE — Assessment & Plan Note (Signed)
Well controlled, no changes to meds. Encouraged heart healthy diet such as the DASH diet and exercise as tolerated.  °

## 2023-07-13 NOTE — Assessment & Plan Note (Signed)
Shingrix is the new shingles shot, 2 shots over 2-6 months, confirm coverage with insurance and document, then can return here for shots with nurse appt or at pharmacy  

## 2023-07-13 NOTE — Assessment & Plan Note (Signed)
No recent exacerbation or change in meds

## 2023-07-13 NOTE — Assessment & Plan Note (Signed)
Tolerating statin, encouraged heart healthy diet, avoid trans fats, minimize simple carbs and saturated fats. Increase exercise as tolerated 

## 2023-07-17 ENCOUNTER — Ambulatory Visit (INDEPENDENT_AMBULATORY_CARE_PROVIDER_SITE_OTHER): Payer: Medicare Other | Admitting: Family Medicine

## 2023-07-17 ENCOUNTER — Encounter: Payer: Self-pay | Admitting: Family Medicine

## 2023-07-17 VITALS — BP 134/88 | HR 74 | Temp 98.7°F | Resp 18 | Ht 65.0 in | Wt 294.4 lb

## 2023-07-17 DIAGNOSIS — E1165 Type 2 diabetes mellitus with hyperglycemia: Secondary | ICD-10-CM

## 2023-07-17 DIAGNOSIS — E559 Vitamin D deficiency, unspecified: Secondary | ICD-10-CM

## 2023-07-17 DIAGNOSIS — D508 Other iron deficiency anemias: Secondary | ICD-10-CM | POA: Diagnosis not present

## 2023-07-17 DIAGNOSIS — I110 Hypertensive heart disease with heart failure: Secondary | ICD-10-CM

## 2023-07-17 DIAGNOSIS — Z7985 Long-term (current) use of injectable non-insulin antidiabetic drugs: Secondary | ICD-10-CM

## 2023-07-17 DIAGNOSIS — E1159 Type 2 diabetes mellitus with other circulatory complications: Secondary | ICD-10-CM

## 2023-07-17 DIAGNOSIS — L03115 Cellulitis of right lower limb: Secondary | ICD-10-CM | POA: Diagnosis not present

## 2023-07-17 DIAGNOSIS — I10 Essential (primary) hypertension: Secondary | ICD-10-CM | POA: Diagnosis not present

## 2023-07-17 DIAGNOSIS — E7800 Pure hypercholesterolemia, unspecified: Secondary | ICD-10-CM | POA: Diagnosis not present

## 2023-07-17 LAB — CBC WITH DIFFERENTIAL/PLATELET
Basophils Absolute: 0 10*3/uL (ref 0.0–0.1)
Basophils Relative: 0.4 % (ref 0.0–3.0)
Eosinophils Absolute: 0 10*3/uL (ref 0.0–0.7)
Eosinophils Relative: 0.4 % (ref 0.0–5.0)
HCT: 41.2 % (ref 36.0–46.0)
Hemoglobin: 12.9 g/dL (ref 12.0–15.0)
Lymphocytes Relative: 20.8 % (ref 12.0–46.0)
Lymphs Abs: 1.4 10*3/uL (ref 0.7–4.0)
MCHC: 31.4 g/dL (ref 30.0–36.0)
MCV: 87.9 fL (ref 78.0–100.0)
Monocytes Absolute: 0.3 10*3/uL (ref 0.1–1.0)
Monocytes Relative: 5 % (ref 3.0–12.0)
Neutro Abs: 5 10*3/uL (ref 1.4–7.7)
Neutrophils Relative %: 73.4 % (ref 43.0–77.0)
Platelets: 235 10*3/uL (ref 150.0–400.0)
RBC: 4.69 Mil/uL (ref 3.87–5.11)
RDW: 16.6 % — ABNORMAL HIGH (ref 11.5–15.5)
WBC: 6.9 10*3/uL (ref 4.0–10.5)

## 2023-07-17 LAB — TSH: TSH: 2.18 u[IU]/mL (ref 0.35–5.50)

## 2023-07-17 LAB — VITAMIN D 25 HYDROXY (VIT D DEFICIENCY, FRACTURES): VITD: 7.73 ng/mL — ABNORMAL LOW (ref 30.00–100.00)

## 2023-07-17 LAB — LIPID PANEL
Cholesterol: 114 mg/dL (ref 0–200)
HDL: 40.1 mg/dL (ref >39.00)
LDL Cholesterol: 38 mg/dL (ref 0–99)
NonHDL: 73.62
Total CHOL/HDL Ratio: 3
Triglycerides: 179 mg/dL — ABNORMAL HIGH (ref 0.0–149.0)
VLDL: 35.8 mg/dL (ref 0.0–40.0)

## 2023-07-17 LAB — COMPREHENSIVE METABOLIC PANEL
ALT: 62 U/L — ABNORMAL HIGH (ref 0–35)
AST: 70 U/L — ABNORMAL HIGH (ref 0–37)
Albumin: 4.4 g/dL (ref 3.5–5.2)
Alkaline Phosphatase: 58 U/L (ref 39–117)
BUN: 13 mg/dL (ref 6–23)
CO2: 28 meq/L (ref 19–32)
Calcium: 9.4 mg/dL (ref 8.4–10.5)
Chloride: 103 meq/L (ref 96–112)
Creatinine, Ser: 0.62 mg/dL (ref 0.40–1.20)
GFR: 88.83 mL/min (ref >60.00)
Glucose, Bld: 124 mg/dL — ABNORMAL HIGH (ref 70–99)
Potassium: 4.7 meq/L (ref 3.5–5.1)
Sodium: 141 meq/L (ref 135–145)
Total Bilirubin: 0.5 mg/dL (ref 0.2–1.2)
Total Protein: 7.5 g/dL (ref 6.0–8.3)

## 2023-07-17 NOTE — Patient Instructions (Signed)
Bone Infection (Osteomyelitis) in Adults: What to Know  Osteomyelitis is a bone infection that is caused when bacteria or other germs get inside a bone. This can happen if you have an infection in another part of your body that spreads through your blood to the bone. It can also happen if you have a wound or a broken bone, also called a fracture, that breaks your skin and lets germs in that spread to your bone. Bone infections need to be treated quickly to: Prevent bone damage. Prevent the infection from spreading to other parts of your body. What are the causes? Bacteria. Most bone infections are caused by a type of bacteria called staphylococcus that is found on the skin. Other germs, such as a virus or a fungus. What increases the risk? Having surgery recently, especially bone or joint surgery. Getting injured, such as stepping on a nail or having a broken bone that goes through your skin. Having a long-term disease, such as: Diabetes. Rheumatoid arthritis. Kidney disease with dialysis. Having a condition that affects your body's defense system, also called the immune system, or taking medicines that weaken the immune system. Having an artificial joint. Having had a joint or bone fixed with plates or screws. Using IV drugs. Having a condition that reduces your blood flow. What are the signs or symptoms? Symptoms depend on the type and place of your infection. Common symptoms of bone infections include: Fever and chills. Skin redness and warmth. Swelling. Pain and stiffness. Fluid or pus draining near the infection. How is this diagnosed? This condition may be diagnosed based on: Your symptoms and medical history. A physical exam. Tests, such as: A small amount of tissue, fluid, or blood taken for testing. Pus or discharge swabbed from a wound for testing. This is to find out the type of germs that are causing the infection and what type of medicine will kill them. Blood  tests. Imaging tests, such s X-rays, MRI, CT scan, bone scan, or ultrasound. How is this treated? Treatment depends on the cause and type of infection. Antibiotics are usually the first treatment. You may need to have antibiotics at a hospital at first. You may need to continue IV antibiotics at home or take antibiotics by mouth for several weeks after that. Other treatments may include surgery: To remove dead or dying tissue from a bone. To take out an infected artificial joint. To take out infected plates or screws that were used to fix a broken bone. Follow these instructions at home: Medicines Take your medicines only as told. Take your antibiotics as told. Do not stop taking them even if you start to feel better. Follow instructions from your health care provider about how to take IV antibiotics at home. You may need to have a provider come to your home to give you IV antibiotics. General instructions Rest as told. Ask what things are safe for you to do at home. Ask when you can go back to work or school. Do not smoke, vape, or use nicotine or tobacco. Keep all follow-up visits. Your provider will need to check on your condition. How is this prevented? Wash your hands often to stop the spread of germs. Wash your hands for at least 20 seconds with soap and water. If you can't use soap and water, use hand sanitizer. Keep any open areas, cuts, or wounds clean. Put on a clean bandage after cleaning the area. Check wounds often for signs of infections. Signs of infection include redness,  swelling, warmth, pus, or a bad smell. Wear shoes to prevent injuries to your feet. Contact a health care provider if: You develop a fever or chills. You have redness, warmth, pain, or swelling that comes back after treatment. Get help right away if: You have fast breathing or you have trouble breathing. You have chest pain. You can't drink anything or you can't pee. The affected area swells, changes  color, or turns blue. You have numbness or very bad pain in the affected area. These symptoms may be an emergency. Call 911 right away. Do not wait to see if the symptoms will go away. Do not drive yourself to the hospital. This information is not intended to replace advice given to you by your health care provider. Make sure you discuss any questions you have with your health care provider. Document Revised: 11/01/2022 Document Reviewed: 11/01/2022 Elsevier Patient Education  2024 [employer].

## 2023-07-18 LAB — IRON,TIBC AND FERRITIN PANEL
%SAT: 15 % — ABNORMAL LOW (ref 16–45)
Ferritin: 13 ng/mL — ABNORMAL LOW (ref 16–288)
Iron: 75 ug/dL (ref 45–160)
TIBC: 494 ug/dL — ABNORMAL HIGH (ref 250–450)

## 2023-07-19 ENCOUNTER — Encounter: Payer: Self-pay | Admitting: Family Medicine

## 2023-07-19 NOTE — Progress Notes (Signed)
Subjective:    [name], female    DOB: 06-04-1951, 73 y.o.   MRN: [id number]  Chief Complaint  Patient presents with  . Follow-up    Hospital    HPI Discussed the use of AI scribe software for clinical note transcription with the patient, who gave verbal consent to proceed.  History of Present Illness   The patient, with a history of foot trauma, presents with a persistent skin infection on the foot that started three weeks ago after an injury. The patient reports that the condition initially presented with systemic symptoms such as fever, chills, body aches, and fatigue, which have since resolved. The skin condition, characterized by changes in color, swelling, and pain, has not improved significantly despite treatment with doxycycline. The pain and swelling are particularly pronounced towards the end of the day. The patient has been prescribed a different antibiotic, Padflex, which they have not yet started.        Past Medical History:  Diagnosis Date  . Acquired dilation of ascending aorta and aortic root (HCC)    Ascending arctic 43 mm and aortic root 40 mm by chest CTA 09/2022  . Anemia 04/05/2012  . Anxiety   . Anxiety state 09/11/2007   Qualifier: Diagnosis of  By: Everett Graff    . Asthma   . Atrial tachycardia, paroxysmal (HCC)   . Back pain 10/02/2014  . Breast cancer (HCC) 02/17/2017  . CAD (coronary artery disease), native coronary artery    remote Cath with nonobstructive ASCAD with 20% mid LAD, 20% prox left circ and 20% ostial RCA  . Chicken pox as a child  . Chronic diastolic CHF (congestive heart failure), NYHA class 1 (HCC)   . Complication of anesthesia    pt states feels different in her body after anesthesia when waking up and also experiences a smell of burnt plastic for approx a wk   . Constipation   . Depression   . Diabetes (HCC)   . Dilated aortic root (HCC)    42mm by echo 08/2020  . Dysuria 02/17/2017  . Elevated LFTs  04/05/2012  . GERD (gastroesophageal reflux disease)   . Goiter   . Heart murmur    hx of one at birth   . History of kidney stones   . History of radiation therapy 10/31/16-12/17/16   left breast 45 Gy in 25 fractions, left breast boost 16 Gy in 8 fractions  . Hx of colonic polyps   . Hyperlipidemia   . Hypertension   . Insomnia 10/08/2016  . Joint pain   . Malignant neoplasm of upper-outer quadrant of left female breast (HCC) 05/14/2016   not with patient  . Measles as a child  . Mumps as a child  . OA (osteoarthritis) of knee 04/05/2012  . Obesity 07/11/2014  . PONV (postoperative nausea and vomiting)   . Preventative health care 09/05/2013  . PVC's (premature ventricular contractions) 05/02/2016  . Shortness of breath dyspnea    walking distances / climbing stairs  . Sleep apnea 04/05/2012  . Tinea corporis 02/23/2013  . Type 2 diabetes mellitus with hyperglycemia (HCC) 12/31/2013  . Vasomotor rhinitis 04/05/2012  . Ventral hernia   . Wheezing     Past Surgical History:  Procedure Laterality Date  . BREAST LUMPECTOMY Left 2017  . BREAST LUMPECTOMY WITH RADIOACTIVE SEED AND SENTINEL LYMPH NODE BIOPSY Left 06/21/2016   Procedure: LEFT BREAST LUMPECTOMY WITH RADIOACTIVE SEED AND SENTINEL LYMPH NODE BIOPSY,  INJECT BLUE DYE LEFT BREAST;  Surgeon: Claud Kelp, MD;  [facility];  Service: General;  Laterality: Left;  . CARDIAC CATHETERIZATION     normal coroary arteries per patient  . CARDIOVASCULAR STRESS TEST     10/12/2013  . CESAREAN SECTION     X 3  . CHOLECYSTECTOMY    . COLONOSCOPY WITH PROPOFOL N/A 03/28/2015   Procedure: COLONOSCOPY WITH PROPOFOL;  Surgeon: Charna Elizabeth, MD;  Location: [facility] ENDOSCOPY;  Service: Endoscopy;  Laterality: N/A;  . HERNIA REPAIR  02/08/2011   ventral hernia  . JOINT REPLACEMENT     bilateral  . KNEE ARTHROSCOPY  05/2010   bilateral  . LEFT AND RIGHT HEART CATHETERIZATION WITH CORONARY ANGIOGRAM N/A 11/08/2013   Procedure: LEFT  AND RIGHT HEART CATHETERIZATION WITH CORONARY ANGIOGRAM;  Surgeon: Kathleene Hazel, MD;  Location: [facility] CATH LAB;  Service: Cardiovascular;  Laterality: N/A;  . MENISCUS REPAIR  2009  . MOUTH SURGERY     teeth implants  . PORT-A-CATH REMOVAL N/A 01/24/2017   Procedure: REMOVAL PORT-A-CATH;  Surgeon: Claud Kelp, MD;  Location: [facility] OR;  Service: General;  Laterality: N/A;  . PORTACATH PLACEMENT Right 07/16/2016   Procedure: INSERTION PORT-A-CATH RIGHT INTERNAL JUGULAR WITH ULTRASOUND;  Surgeon: Claud Kelp, MD;  Location: [facility] OR;  Service: General;  Laterality: Right;  . REPAIR DURAL / CSF LEAK  2021   performed at [facility] in [place]  . TOTAL KNEE ARTHROPLASTY  2011   left  . TOTAL KNEE ARTHROPLASTY Right 05/10/2014   Procedure: RIGHT TOTAL KNEE ARTHROPLASTY;  Surgeon: Shelda Pal, MD;  Location: [facility] ORS;  Service: Orthopedics;  Laterality: Right;  . TOTAL THYROIDECTOMY Bilateral 2022  . TUBAL LIGATION    . WISDOM TOOTH EXTRACTION  2000    Family History  Problem Relation Age of Onset  . Thyroid disease Mother        hypothyroid  . Hyperlipidemia Mother   . Depression Mother   . Anxiety disorder Mother   . Heart attack Father 73  . Hypertension Father   . Arthritis Father        RA  . Coronary artery disease Father   . High Cholesterol Father   . Coronary artery disease Brother   . Heart disease Brother   . Cancer Maternal Grandmother        colon  . Heart attack Maternal Grandfather   . Alcohol abuse Paternal Grandfather   . Cancer Maternal Aunt        colon    Social History   Socioeconomic History  . Marital status: Divorced    Spouse name: Not on file  . Number of children: 3  . Years of education: Not on file  . Highest education level: 12th grade  Occupational History  . Occupation: retired - [employer]  Tobacco Use  . Smoking status: Never  . Smokeless tobacco: Never  Vaping Use  . Vaping status: Never Used  Substance and  Sexual Activity  . Alcohol use: Not Currently    Comment: rarely  . Drug use: No  . Sexual activity: Never  Other Topics Concern  . 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 Difficulty of Paying Living Expenses: Not hard at all  Food Insecurity: No Food Insecurity (  10/23/2022)   Hunger Vital Sign   . Worried About Programme researcher, broadcasting/film/video in the Last Year: Never true   . Ran Out of Food in the Last Year: Never true  Transportation Needs: No Transportation Needs (10/23/2022)   PRAPARE - Transportation   . Lack of Transportation (Medical): No   . Lack of Transportation (Non-Medical): No  Physical Activity: Insufficiently Active (10/23/2022)   Exercise Vital Sign   . Days of Exercise per Week: 2 days   . Minutes of Exercise per Session: 20 min  Stress: No Stress Concern Present (10/23/2022)   [employer] of Occupational Health - Occupational Stress Questionnaire   . Feeling of Stress : Only a little  Social Connections: Moderately Integrated (10/23/2022)   Social Connection and Isolation Panel [NHANES]   . Frequency of Communication with Friends and Family: More than three times a week   . Frequency of Social Gatherings with Friends and Family: Once a week   . Attends Religious Services: More than 4 times per year   . Active Member of Clubs or Organizations: Yes   . Attends Banker Meetings: More than 4 times per year   . Marital Status: Divorced  Catering manager Violence: Not At Risk (08/14/2022)   Humiliation, Afraid, Rape, and Kick questionnaire   . Fear of Current or Ex-Partner: No   . Emotionally Abused: No   . Physically Abused: No   . Sexually Abused: No    Outpatient Medications Prior to Visit  Medication Sig Dispense Refill  . Accu-Chek Softclix Lancets lancets Use to check blood  glucose up to twice a day (Dx: Type 2 DM with long term insulin use E11.57, Z79.4) 100 each 5  . acetaminophen (TYLENOL) 500 MG tablet Take 1,000 mg by mouth 2 (two) times daily as needed for moderate pain or headache.    . albuterol (VENTOLIN HFA) 108 (90 Base) MCG/ACT inhaler Inhale 2 puffs every 4-6 hours as needed - rescue. 18 g 12  . anastrozole (ARIMIDEX) 1 MG tablet TAKE ONE TABLET DAILY 90 tablet 3  . aspirin EC 81 MG tablet Take 81 mg by mouth daily. Swallow whole.    . blood glucose meter kit and supplies Dispense based on patient and insurance preference. Use up to four times daily as directed. (FOR ICD-10 E10.9, E11.9). 1 each 0  . budesonide-formoterol (SYMBICORT) 160-4.5 MCG/ACT inhaler Inhale 2 puffs into the lungs 2 (two) times daily. As needed    . busPIRone (BUSPAR) 7.5 MG tablet TAKE ONE TABLET BY MOUTH THREE TIMES DAILY AS NEEDED FOR ANXIETY 90 tablet 5  . carvedilol (COREG) 25 MG tablet Take 1 tablet (25 mg total) by mouth 2 (two) times daily with a meal. 180 tablet 1  . cephALEXin (KEFLEX) 500 MG capsule Take 1 capsule (500 mg total) by mouth 4 (four) times daily. 20 capsule 0  . cholecalciferol (VITAMIN D3) 25 MCG (1000 UNIT) tablet Take 2,000 Units by mouth daily.    . Continuous Glucose Sensor (FREESTYLE LIBRE 3 SENSOR) MISC 1 each by Does not apply route every 14 (fourteen) days. Use to check blood glucose continuously. Change sensor every 14 days.    Marland Kitchen diltiazem (CARDIZEM CD) 120 MG 24 hr capsule Take 1 capsule (120 mg total) by mouth daily. 90 capsule 0  . doxycycline (VIBRAMYCIN) 100 MG capsule Take 1 capsule (100 mg total) by mouth 2 (two) times daily. 20 capsule 0  . escitalopram (LEXAPRO) 20 MG tablet Take 1 tablet (20  mg total) by mouth in the morning AND 0.5 tablets (10 mg total) every evening. 135 tablet 0  . esomeprazole (NEXIUM) 40 MG capsule Take 1 capsule (40 mg total) by mouth daily. 90 capsule 0  . ezetimibe (ZETIA) 10 MG tablet Take 1 tablet (10 mg total) by  mouth daily. 90 tablet 0  . fenofibrate (TRICOR) 145 MG tablet Take 1 tablet (145 mg total) by mouth daily. 90 tablet 1  . ferrous sulfate 325 (65 FE) MG EC tablet Take 325 mg by mouth every other day.    . fluticasone (FLONASE) 50 MCG/ACT nasal spray Place 2 sprays into both nostrils daily. 16 g 6  . furosemide (LASIX) 20 MG tablet Take 1 tablet (20 mg total) by mouth daily. 90 tablet 0  . glucose blood (ACCU-CHEK AVIVA) test strip Twice daily (please use brand that patient insurance covers) 100 each 5  . GNP ULTICARE PEN NEEDLES 32G X 4 MM MISC AS DIRECTED DAILY 100 each 3  . insulin degludec (TRESIBA FLEXTOUCH) 200 UNIT/ML FlexTouch Pen Inject 12-20 Units into the skin daily. (Patient taking differently: Inject 8 Units into the skin daily.) 9 mL 3  . levothyroxine (SYNTHROID) 200 MCG tablet Take 1 tablet (200 mcg total) by mouth daily before breakfast. Take with a tablet daily to equal daily 90 tablet 0  . levothyroxine (SYNTHROID) 50 MCG tablet Take 1 tablet (50 mcg total) by mouth daily before breakfast. Take with tablet daily to equal 90 tablet 0  . loratadine (CLARITIN) 10 MG tablet Take 1 tablet (10 mg total) by mouth daily. 30 tablet 11  . losartan (COZAAR) 100 MG tablet Take 1 tablet (100 mg total) by mouth daily. 90 tablet 0  . metFORMIN (GLUCOPHAGE-XR) 500 MG 24 hr tablet Take 4 tablets (2,000 mg total) by mouth daily with supper. 360 tablet 0  . montelukast (SINGULAIR) 10 MG tablet Take 1 tablet (10 mg total) by mouth at bedtime. 90 tablet 1  . rosuvastatin (CRESTOR) 40 MG tablet Take 1 tablet (40 mg total) by mouth daily. 90 tablet 0  . Semaglutide, 1 MG/DOSE, (OZEMPIC, 1 MG/DOSE,) 4 MG/3ML SOPN Inject 1 mg into the skin once a week. 9 mL 3  . Vitamin D, Ergocalciferol, (DRISDOL) 1.25 MG (50000 UNIT) CAPS capsule Take 1 capsule (50,000 Units total) by mouth every 7 (seven) days. 4 capsule 4   Facility-Administered Medications Prior to Visit  Medication Dose  Route Frequency Provider Last Rate Last Admin  . denosumab (PROLIA) injection 60 mg  60 mg Subcutaneous Once [name], MD        Allergies  Allergen Reactions  . Oxycodone Other (See Comments)    Does not feel good. Makes her sleepy.    Review of Systems  Constitutional:  Negative for fever and malaise/fatigue.  HENT:  Negative for congestion.   Eyes:  Negative for blurred vision.  Respiratory:  Negative for shortness of breath.   Cardiovascular:  Negative for chest pain, palpitations and leg swelling.  Gastrointestinal:  Negative for abdominal pain, blood in stool and nausea.  Genitourinary:  Negative for dysuria and frequency.  Musculoskeletal:  Positive for joint pain. Negative for falls.  Skin:  Positive for rash.  Neurological:  Negative for dizziness, loss of consciousness and headaches.  Endo/Heme/Allergies:  Negative for environmental allergies.  Psychiatric/Behavioral:  Negative for depression. The patient is not nervous/anxious.       Objective:    Physical Exam Constitutional:  General: She is not in acute distress.    Appearance: Normal appearance. She is well-developed. She is not toxic-appearing.  HENT:     Head: Normocephalic and atraumatic.     Right Ear: External ear normal.     Left Ear: External ear normal.     Nose: Nose normal.  Eyes:     General:        Right eye: No discharge.        Left eye: No discharge.     Conjunctiva/sclera: Conjunctivae normal.  Neck:     Thyroid: No thyromegaly.  Cardiovascular:     Rate and Rhythm: Normal rate and regular rhythm.     Heart sounds: Normal heart sounds. No murmur heard. Pulmonary:     Effort: Pulmonary effort is normal. No respiratory distress.     Breath sounds: Normal breath sounds.  Abdominal:     General: Bowel sounds are normal.     Palpations: Abdomen is soft.     Tenderness: There is no abdominal tenderness. There is no guarding.  Musculoskeletal:        General: Swelling present.  Normal range of motion.     Cervical back: Neck supple.     Comments: Right foot, swollen and erythematous anterior and caudal to later al malleolus  Lymphadenopathy:     Cervical: No cervical adenopathy.  Skin:    General: Skin is warm and dry.  Neurological:     Mental Status: She is alert and oriented to person, place, and time.  Psychiatric:        Mood and Affect: Mood normal.        Behavior: Behavior normal.        Thought Content: Thought content normal.        Judgment: Judgment normal.   BP 134/88 (BP Location: Right Arm, Patient Position: Sitting, Cuff Size: Large)   Pulse 74   Temp 98.7 F (37.1 C) (Oral)   Resp 18   Ht 5\' 5"  (1.651 m)   Wt 294 lb 6.4 oz (133.5 kg)   SpO2 96%   BMI 48.99 kg/m  Wt Readings from Last 3 Encounters:  07/17/23 294 lb 6.4 oz (133.5 kg)  07/03/23 290 lb (131.5 kg)  05/20/23 294 lb 9.6 oz (133.6 kg)    Diabetic Foot Exam - Simple   No data filed    Lab Results  Component Value Date   WBC 6.9 07/17/2023   HGB 12.9 07/17/2023   HCT 41.2 07/17/2023   PLT 235.0 07/17/2023   GLUCOSE 124 (H) 07/17/2023   CHOL 114 07/17/2023   TRIG 179.0 (H) 07/17/2023   HDL 40.10 07/17/2023   LDLDIRECT 66.0 10/24/2022   LDLCALC 38 07/17/2023   ALT 62 (H) 07/17/2023   AST 70 (H) 07/17/2023   NA 141 07/17/2023   K 4.7 07/17/2023   CL 103 07/17/2023   CREATININE 0.62 07/17/2023   BUN 13 07/17/2023   CO2 28 07/17/2023   TSH 2.18 07/17/2023   INR 1.0 04/24/2022   HGBA1C 7.4 (H) 03/05/2023   MICROALBUR 18.1 (H) 03/05/2023    Lab Results  Component Value Date   TSH 2.18 07/17/2023   Lab Results  Component Value Date   WBC 6.9 07/17/2023   HGB 12.9 07/17/2023   HCT 41.2 07/17/2023   MCV 87.9 07/17/2023   PLT 235.0 07/17/2023   Lab Results  Component Value Date   NA 141 07/17/2023   K 4.7 07/17/2023   CHLORIDE 107 04/11/2017  CO2 28 07/17/2023   GLUCOSE 124 (H) 07/17/2023   BUN 13 07/17/2023   CREATININE 0.62 07/17/2023    BILITOT 0.5 07/17/2023   ALKPHOS 58 07/17/2023   AST 70 (H) 07/17/2023   ALT 62 (H) 07/17/2023   PROT 7.5 07/17/2023   ALBUMIN 4.4 07/17/2023   CALCIUM 9.4 07/17/2023   ANIONGAP 9 04/24/2022   EGFR >60 04/11/2017   GFR 88.83 07/17/2023   Lab Results  Component Value Date   CHOL 114 07/17/2023   Lab Results  Component Value Date   HDL 40.10 07/17/2023   Lab Results  Component Value Date   LDLCALC 38 07/17/2023   Lab Results  Component Value Date   TRIG 179.0 (H) 07/17/2023   Lab Results  Component Value Date   CHOLHDL 3 07/17/2023   Lab Results  Component Value Date   HGBA1C 7.4 (H) 03/05/2023       Assessment & Plan:  Essential hypertension Assessment & Plan: Well controlled, no changes to meds. Encouraged heart healthy diet such as the DASH diet and exercise as tolerated.   Orders: -     CBC with Differential/Platelet -     Comprehensive metabolic panel -     TSH  Pure hypercholesterolemia Assessment & Plan: Tolerating statin, encouraged heart healthy diet, avoid trans fats, minimize simple carbs and saturated fats. Increase exercise as tolerated  Orders: -     Lipid panel  LVH (left ventricular hypertrophy) due to hypertensive disease, with heart failure (HCC) Assessment & Plan: No recent exacerbation or change in meds   Poorly controlled type 2 diabetes mellitus with circulatory disorder [facility]) Assessment & Plan: Shingrix is the new shingles shot, 2 shots over 2-6 months, confirm coverage with insurance and document, then can return here for shots with nurse appt or at pharmacy    Vitamin D deficiency Assessment & Plan: Supplement and monitor  Orders: -     VITAMIN D 25 Hydroxy (Vit-D Deficiency, Fractures)  Cellulitis of right lower extremity -     CT FOOT RIGHT W CONTRAST; Future  Other iron deficiency anemia -     Iron, TIBC and Ferritin Panel    Assessment and Plan    Cellulitis of the foot Persistent symptoms for 3 weeks despite  treatment with doxycycline. Noted color changes and worsening swelling and pain towards the end of the day. No systemic symptoms since initial presentation. -Start Cephalexin 500mg  four times a day. -Order CT scan of the foot to rule out osteomyelitis. -Advise patient to elevate foot above heart level frequently to improve circulation and reduce swelling. -If symptoms worsen or systemic symptoms develop, patient should seek immediate medical attention.  Routine Health Maintenance -Order routine blood work including CBC to monitor for signs of systemic infection. -Schedule follow-up virtual visit in 3-4 weeks to assess response to treatment. -Schedule routine in-person follow-up in 3 months.         Danise Edge, MD

## 2023-07-22 NOTE — Telephone Encounter (Signed)
Following on pt PAP Novo Nordisk [employer]) gave pt a call ,pt answer our call explain she has not been able to mail back the application ask pt if it's ok for it to be submitted on line pt agree,we  have submitted  pap on Ozempic plus fax Dr Nicoletta Ba also ask to submitted one on Tresiba.

## 2023-07-25 ENCOUNTER — Telehealth: Payer: Self-pay

## 2023-07-25 ENCOUNTER — Ambulatory Visit (HOSPITAL_BASED_OUTPATIENT_CLINIC_OR_DEPARTMENT_OTHER)
Admission: RE | Admit: 2023-07-25 | Discharge: 2023-07-25 | Disposition: A | Discharge status: Home / Self Care | Payer: Medicare Other | Source: Ambulatory Visit | Attending: Family Medicine | Admitting: Family Medicine

## 2023-07-25 DIAGNOSIS — L03115 Cellulitis of right lower limb: Secondary | ICD-10-CM | POA: Insufficient documentation

## 2023-07-25 DIAGNOSIS — M7731 Calcaneal spur, right foot: Secondary | ICD-10-CM | POA: Diagnosis not present

## 2023-07-25 DIAGNOSIS — M7989 Other specified soft tissue disorders: Secondary | ICD-10-CM | POA: Diagnosis not present

## 2023-07-25 MED ORDER — IOHEXOL 300 MG/ML  SOLN
100.0000 mL | Freq: Once | INTRAMUSCULAR | Status: AC | PRN
  Administered 2023-07-25: 100 mL via INTRAVENOUS

## 2023-07-25 MED ORDER — IOHEXOL 300 MG/ML  SOLN: 100.0000 mL | Freq: Once | INTRAMUSCULAR | Status: DC | PRN

## 2023-07-25 NOTE — Telephone Encounter (Signed)
Calling in regards to PAP Novo Nordisk (Ozempic and Tresiba),spoke with pt and gave consent to fill her application on line plus fax provider portion,we have received provider portion and have faxed it to [employer] will follow up in a few days.

## 2023-07-30 NOTE — Telephone Encounter (Signed)
 Informed pt she has been APPROVED on NOVO NORDISK  Tresiba  and Ozempic  for 2025,pt will expect a call from Dr office when they received from company.

## 2023-07-31 NOTE — Telephone Encounter (Signed)
 Spoke w/ radiology read room- have requested read on CT

## 2023-08-01 ENCOUNTER — Telehealth: Payer: Self-pay

## 2023-08-01 NOTE — Telephone Encounter (Signed)
 Per Novo Nordisk pt has been APPROVED for 2025 Ozempic  and Tresiba .

## 2023-08-01 NOTE — Telephone Encounter (Signed)
 Copied from CRM [id number]. Topic: Clinical - Lab/Test Results >> Aug 01, 2023  4:46 PM [name] F wrote: Reason for CRM: Patient request call back to receive her results from CT Scan done 07/25/23

## 2023-08-04 ENCOUNTER — Other Ambulatory Visit: Payer: Self-pay | Admitting: Family Medicine

## 2023-08-04 DIAGNOSIS — L0291 Cutaneous abscess, unspecified: Secondary | ICD-10-CM

## 2023-08-04 MED ORDER — CEFDINIR 300 MG PO CAPS: 300.0000 mg | ORAL_CAPSULE | Freq: Two times a day (BID) | ORAL | 0 refills | Status: AC

## 2023-08-05 ENCOUNTER — Other Ambulatory Visit: Payer: Self-pay | Admitting: Family Medicine

## 2023-08-05 DIAGNOSIS — L0291 Cutaneous abscess, unspecified: Secondary | ICD-10-CM

## 2023-08-12 DIAGNOSIS — J310 Chronic rhinitis: Secondary | ICD-10-CM | POA: Diagnosis not present

## 2023-08-19 ENCOUNTER — Ambulatory Visit (INDEPENDENT_AMBULATORY_CARE_PROVIDER_SITE_OTHER): Payer: Medicare Other

## 2023-08-19 VITALS — Ht 65.0 in | Wt 294.0 lb

## 2023-08-19 DIAGNOSIS — Z0000 Encounter for general adult medical examination without abnormal findings: Secondary | ICD-10-CM | POA: Diagnosis not present

## 2023-08-19 NOTE — Patient Instructions (Addendum)
 [name] , Thank you for taking time to come for your Medicare Wellness Visit. I appreciate your ongoing commitment to your health goals. Please review the following plan we discussed and let me know if I can assist you in the future.   Referrals/Orders/Follow-Ups/Clinician Recommendations: A referral has been placed for you to have a hearing test. If you have not heard from them within 7 business days, please call the office to schedule and appointment.   Suzanna Obey [address] [place] [address] Phone: [phone]   This is a list of the screening recommended for you and due dates:  Health Maintenance  Topic Date Due   Complete foot exam   02/15/2023   COVID-19 Vaccine (5 - 2024-25 season) 03/03/2024*   Hemoglobin A1C  09/02/2023   Eye exam for diabetics  10/28/2023   Yearly kidney health urinalysis for diabetes  03/04/2024   Yearly kidney function blood test for diabetes  07/16/2024   Medicare Annual Wellness Visit  08/18/2024   Mammogram  09/15/2024   Colon Cancer Screening  04/16/2029   DTaP/Tdap/Td vaccine (2 - Td or Tdap) 10/03/2031   Pneumonia Vaccine  Completed   Flu Shot  Completed   DEXA scan (bone density measurement)  Completed   Hepatitis C Screening  Completed   Zoster (Shingles) Vaccine  Completed   HPV Vaccine  Aged Out  *Topic was postponed. The date shown is not the original due date.    Advanced directives: (Copy Requested) Please bring a copy of your health care power of attorney and living will to the office to be added to your chart at your convenience.  Next Medicare Annual Wellness Visit scheduled for next year: Yes

## 2023-08-19 NOTE — Progress Notes (Signed)
 Subjective:   [name] is a 73 y.o. female who presents for Medicare Annual (Subsequent) preventive examination.  Visit Complete: Virtual I connected with  Wilfrid Lund on 08/19/23 by a video and audio enabled telemedicine application and verified that I am speaking with the correct person using two identifiers.  Patient Location: Home  Provider Location: Home Office  I discussed the limitations of evaluation and management by telemedicine. The patient expressed understanding and agreed to proceed.  Vital Signs: Because this visit was a virtual/telehealth visit, some criteria may be missing or patient reported. Any vitals not documented were not able to be obtained and vitals that have been documented are patient reported.  Patient Medicare AWV questionnaire was completed by the patient on 08/19/23; I have confirmed that all information answered by patient is correct and no changes since this date.  Cardiac Risk Factors include: advanced age (>60men, >20 women);diabetes mellitus;hypertension     Objective:    Today's Vitals   08/19/23 1353  Weight: 294 lb (133.4 kg)  Height: 5\' 5"  (1.651 m)   Body mass index is 48.92 kg/m.     08/19/2023    2:09 PM 07/03/2023    8:35 PM 08/14/2022    3:01 PM 07/22/2022   10:28 AM 06/11/2022   12:39 PM 05/21/2022    9:35 AM 04/30/2022    1:21 PM  Advanced Directives  Does Patient Have a Medical Advance Directive? Yes Yes Yes Yes Yes Yes Yes  Type of Estate agent of [place];Living will Living will;Healthcare Power of Attorney Living will Healthcare Power of [place];Living will  Healthcare Power of [place];Living will Healthcare Power of [place];Living will  Does patient want to make changes to medical advance directive?      No - Patient declined No - Patient declined  Copy of Healthcare Power of Attorney in Chart? No - copy requested     No - copy requested No - copy requested  Would patient like  information on creating a medical advance directive?      No - Patient declined No - Patient declined    Current Medications (verified) Outpatient Encounter Medications as of 08/19/2023  Medication Sig   Accu-Chek Softclix Lancets lancets Use to check blood glucose up to twice a day (Dx: Type 2 DM with long term insulin use E11.57, Z79.4)   acetaminophen (TYLENOL) 500 MG tablet Take 1,000 mg by mouth 2 (two) times daily as needed for moderate pain or headache.   albuterol (VENTOLIN HFA) 108 (90 Base) MCG/ACT inhaler Inhale 2 puffs every 4-6 hours as needed - rescue.   anastrozole (ARIMIDEX) 1 MG tablet TAKE ONE TABLET DAILY   aspirin EC 81 MG tablet Take 81 mg by mouth daily. Swallow whole.   blood glucose meter kit and supplies Dispense based on patient and insurance preference. Use up to four times daily as directed. (FOR ICD-10 E10.9, E11.9).   budesonide-formoterol (SYMBICORT) 160-4.5 MCG/ACT inhaler Inhale 2 puffs into the lungs 2 (two) times daily. As needed   busPIRone (BUSPAR) 7.5 MG tablet TAKE ONE TABLET BY MOUTH THREE TIMES DAILY AS NEEDED FOR ANXIETY   carvedilol (COREG) 25 MG tablet Take 1 tablet (25 mg total) by mouth 2 (two) times daily with a meal.   cholecalciferol (VITAMIN D3) 25 MCG (1000 UNIT) tablet Take 2,000 Units by mouth daily.   Continuous Glucose Sensor (FREESTYLE LIBRE 3 SENSOR) MISC 1 each by Does not apply route every 14 (fourteen) days. Use to check  blood glucose continuously. Change sensor every 14 days.   diltiazem (CARDIZEM CD) 120 MG 24 hr capsule Take 1 capsule (120 mg total) by mouth daily.   escitalopram (LEXAPRO) 20 MG tablet Take 1 tablet (20 mg total) by mouth in the morning AND 0.5 tablets (10 mg total) every evening.   esomeprazole (NEXIUM) 40 MG capsule Take 1 capsule (40 mg total) by mouth daily.   ezetimibe (ZETIA) 10 MG tablet Take 1 tablet (10 mg total) by mouth daily.   fenofibrate (TRICOR) 145 MG tablet Take 1 tablet (145 mg total) by mouth daily.    ferrous sulfate 325 (65 FE) MG EC tablet Take 325 mg by mouth every other day.   fluticasone (FLONASE) 50 MCG/ACT nasal spray Place 2 sprays into both nostrils daily.   furosemide (LASIX) 20 MG tablet Take 1 tablet (20 mg total) by mouth daily.   glucose blood (ACCU-CHEK AVIVA) test strip Twice daily (please use brand that patient insurance covers)   GNP ULTICARE PEN NEEDLES 32G X 4 MM MISC AS DIRECTED DAILY   insulin degludec (TRESIBA FLEXTOUCH) 200 UNIT/ML FlexTouch Pen Inject 12-20 Units into the skin daily. (Patient taking differently: Inject 8 Units into the skin daily.)   levothyroxine (SYNTHROID) 200 MCG tablet Take 1 tablet (200 mcg total) by mouth daily before breakfast. Take with a tablet daily to equal daily   levothyroxine (SYNTHROID) 50 MCG tablet Take 1 tablet (50 mcg total) by mouth daily before breakfast. Take with tablet daily to equal   loratadine (CLARITIN) 10 MG tablet Take 1 tablet (10 mg total) by mouth daily.   losartan (COZAAR) 100 MG tablet Take 1 tablet (100 mg total) by mouth daily.   metFORMIN (GLUCOPHAGE-XR) 500 MG 24 hr tablet Take 4 tablets (2,000 mg total) by mouth daily with supper.   montelukast (SINGULAIR) 10 MG tablet Take 1 tablet (10 mg total) by mouth at bedtime.   rosuvastatin (CRESTOR) 40 MG tablet Take 1 tablet (40 mg total) by mouth daily.   Semaglutide, 1 MG/DOSE, (OZEMPIC, 1 MG/DOSE,) 4 MG/3ML SOPN Inject 1 mg into the skin once a week.   Vitamin D, Ergocalciferol, (DRISDOL) 1.25 MG (50000 UNIT) CAPS capsule Take 1 capsule (50,000 Units total) by mouth every 7 (seven) days.   Facility-Administered Encounter Medications as of 08/19/2023  Medication   denosumab (PROLIA) injection 60 mg    Allergies (verified) Oxycodone   History: Past Medical History:  Diagnosis Date   Acquired dilation of ascending aorta and aortic root (HCC)    Ascending arctic 43 mm and aortic root 40 mm by chest CTA 09/2022   Anemia 04/05/2012    Anxiety    Anxiety state 09/11/2007   Qualifier: Diagnosis of  By: Everett Graff     Asthma    Atrial tachycardia, paroxysmal (HCC)    Back pain 10/02/2014   Breast cancer (HCC) 02/17/2017   CAD (coronary artery disease), native coronary artery    remote Cath with nonobstructive ASCAD with 20% mid LAD, 20% prox left circ and 20% ostial RCA   Chicken pox as a child   Chronic diastolic CHF (congestive heart failure), NYHA class 1 (HCC)    Complication of anesthesia    pt states feels different in her body after anesthesia when waking up and also experiences a smell of burnt plastic for approx a wk    Constipation    Depression    Diabetes (HCC)    Dilated aortic root (HCC)  42mm by echo 08/2020   Dysuria 02/17/2017   Elevated LFTs 04/05/2012   GERD (gastroesophageal reflux disease)    Goiter    Heart murmur    hx of one at birth    History of kidney stones    History of radiation therapy 10/31/16-12/17/16   left breast 45 Gy in 25 fractions, left breast boost 16 Gy in 8 fractions   Hx of colonic polyps    Hyperlipidemia    Hypertension    Insomnia 10/08/2016   Joint pain    Malignant neoplasm of upper-outer quadrant of left female breast (HCC) 05/14/2016   not with patient   Measles as a child   Mumps as a child   OA (osteoarthritis) of knee 04/05/2012   Obesity 07/11/2014   PONV (postoperative nausea and vomiting)    Preventative health care 09/05/2013   PVC's (premature ventricular contractions) 05/02/2016   Shortness of breath dyspnea    walking distances / climbing stairs   Sleep apnea 04/05/2012   Tinea corporis 02/23/2013   Type 2 diabetes mellitus with hyperglycemia (HCC) 12/31/2013   Vasomotor rhinitis 04/05/2012   Ventral hernia    Wheezing    Past Surgical History:  Procedure Laterality Date   BREAST LUMPECTOMY Left 2017   BREAST LUMPECTOMY WITH RADIOACTIVE SEED AND SENTINEL LYMPH NODE BIOPSY Left 06/21/2016   Procedure: LEFT BREAST LUMPECTOMY WITH  RADIOACTIVE SEED AND SENTINEL LYMPH NODE BIOPSY, INJECT BLUE DYE LEFT BREAST;  Surgeon: Claud Kelp, MD;  Location: [facility] OR;  Service: General;  Laterality: Left;   CARDIAC CATHETERIZATION     normal coroary arteries per patient   CARDIOVASCULAR STRESS TEST     10/12/2013   CESAREAN SECTION     X 3   CHOLECYSTECTOMY     COLONOSCOPY WITH PROPOFOL N/A 03/28/2015   Procedure: COLONOSCOPY WITH PROPOFOL;  Surgeon: Charna Elizabeth, MD;  Location: [facility] ENDOSCOPY;  Service: Endoscopy;  Laterality: N/A;   HERNIA REPAIR  02/08/2011   ventral hernia   JOINT REPLACEMENT     bilateral   KNEE ARTHROSCOPY  05/2010   bilateral   LEFT AND RIGHT HEART CATHETERIZATION WITH CORONARY ANGIOGRAM N/A 11/08/2013   Procedure: LEFT AND RIGHT HEART CATHETERIZATION WITH CORONARY ANGIOGRAM;  Surgeon: Kathleene Hazel, MD;  Location: [facility] CATH LAB;  Service: Cardiovascular;  Laterality: N/A;   MENISCUS REPAIR  2009   MOUTH SURGERY     teeth implants   PORT-A-CATH REMOVAL N/A 01/24/2017   Procedure: REMOVAL PORT-A-CATH;  Surgeon: Claud Kelp, MD;  Location: [facility] OR;  Service: General;  Laterality: N/A;   PORTACATH PLACEMENT Right 07/16/2016   Procedure: INSERTION PORT-A-CATH RIGHT INTERNAL JUGULAR WITH ULTRASOUND;  Surgeon: Claud Kelp, MD;  Location: [facility] OR;  Service: General;  Laterality: Right;   REPAIR DURAL / CSF LEAK  2021   performed at [facility] in [place]   TOTAL KNEE ARTHROPLASTY  2011   left   TOTAL KNEE ARTHROPLASTY Right 05/10/2014   Procedure: RIGHT TOTAL KNEE ARTHROPLASTY;  Surgeon: Shelda Pal, MD;  Location: [facility] ORS;  Service: Orthopedics;  Laterality: Right;   TOTAL THYROIDECTOMY Bilateral 2022   TUBAL LIGATION     WISDOM TOOTH EXTRACTION  2000   Family History  Problem Relation Age of Onset   Thyroid disease Mother        hypothyroid   Hyperlipidemia Mother    Depression Mother    Anxiety disorder Mother    Heart attack Father 64   Hypertension Father  Arthritis  Father        RA   Coronary artery disease Father    High Cholesterol Father    Coronary artery disease Brother    Heart disease Brother    Cancer Maternal Grandmother        colon   Heart attack Maternal Grandfather    Alcohol abuse Paternal Grandfather    Cancer Maternal Aunt        colon   Social History   Socioeconomic History   Marital status: Divorced    Spouse name: Not on file   Number of children: 3   Years of education: Not on file   Highest education level: Some college, no degree  Occupational History   Occupation: retired - [employer]  Tobacco Use   Smoking status: Never   Smokeless tobacco: Never  Vaping Use   Vaping status: Never Used  Substance and Sexual Activity   Alcohol use: Not Currently    Comment: rarely   Drug use: No   Sexual activity: Never  Other Topics Concern   Not on file  Social History Narrative   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)   Social Drivers of Health   Financial Resource Strain: Low Risk  (08/19/2023)   Overall Financial Resource Strain (CARDIA)    Difficulty of Paying Living Expenses: Not hard at all  Food Insecurity: No Food Insecurity (08/19/2023)   Hunger Vital Sign    Worried About Running Out of Food in the Last Year: Never true    Ran Out of Food in the Last Year: Never true  Transportation Needs: No Transportation Needs (08/19/2023)   PRAPARE - Administrator, Civil Service (Medical): No    Lack of Transportation (Non-Medical): No  Physical Activity: Insufficiently Active (08/19/2023)   Exercise Vital Sign    Days of Exercise per Week: 1 day    Minutes of Exercise per Session: 10 min  Stress: No Stress Concern Present (08/19/2023)   [employer] of Occupational Health - Occupational Stress Questionnaire    Feeling of Stress : Only a little  Social Connections: Moderately Integrated (08/19/2023)   Social Connection and Isolation Panel [NHANES]    Frequency of Communication with  Friends and Family: Three times a week    Frequency of Social Gatherings with Friends and Family: Once a week    Attends Religious Services: More than 4 times per year    Active Member of [employer] or Organizations: Yes    Attends Engineer, structural: More than 4 times per year    Marital Status: Divorced    Tobacco Counseling Counseling given: Not Answered   Clinical Intake:  Pre-visit preparation completed: Yes  Pain : No/denies pain     BMI - recorded: 48.92 Nutritional Status: BMI > 30  Obese Nutritional Risks: None Diabetes: Yes CBG done?: No Did pt. bring in CBG monitor from home?: No  How often do you need to have someone help you when you read instructions, pamphlets, or other written materials from your doctor or pharmacy?: 1 - Never  Interpreter Needed?: No  Information entered by :: Theresa Mulligan LPN   Activities of Daily Living    08/19/2023    2:03 PM 08/19/2023   11:25 AM  In your present state of health, do you have any difficulty performing the following activities:  Hearing? 1 1  Comment Referral made, pending appt   Vision? 0 0  Difficulty concentrating or making  decisions? 0 0  Walking or climbing stairs? 1 1  Comment Uses [employer] or bathing? 0 0  Doing errands, shopping? 0 0  Preparing Food and eating ? N N  Using the Toilet? N N  In the past six months, have you accidently leaked urine? N Y  Do you have problems with loss of bowel control? N N  Managing your Medications? N N  Managing your Finances? N N  Housekeeping or managing your Housekeeping? Malvin Johns  Comment Aide assist     Patient Care Team: [name], MD as PCP - General (Family Medicine) Quintella Reichert, MD as PCP - Cardiology (Cardiology) Claud Kelp, MD as Consulting Physician (General Surgery) Serena Croissant, MD as Consulting Physician (Hematology and Oncology) Antony Blackbird, MD as Consulting Physician (Radiation Oncology) Axel Filler Larna Daughters, NP as Nurse Practitioner (Hematology and Oncology) Henrene Pastor, RPH-CPP (Pharmacist)  Indicate any recent Medical Services you may have received from other than [facility] providers in the past year (date may be approximate).     Assessment:   This is a routine wellness examination for Talayla.  Hearing/Vision screen Hearing Screening - Comments:: A referral has been placed for you to have a hearing test. If you have not heard from them within 7 business days, please call the office to schedule and appointment.   Suzanna Obey [address] [place] [address] Phone: [phone]  Vision Screening - Comments:: Wears rx glasses - up to date with routine eye exams with  [facility]   Goals Addressed               This Visit's Progress     Increase physical activity (pt-stated)        Stay Active       Depression Screen    08/19/2023    2:16 PM 03/05/2023   10:53 AM 10/24/2022    2:05 PM 08/14/2022    3:04 PM 04/29/2022    9:43 AM 01/22/2022    9:07 AM 08/13/2021    9:46 AM  PHQ 2/9 Scores  PHQ - 2 Score 0 2 0 0 0 2 0  PHQ- 9 Score  6 0   2     Fall Risk    08/19/2023    2:08 PM 08/19/2023   11:25 AM 03/05/2023   10:53 AM 10/24/2022    2:05 PM 08/14/2022    9:05 AM  Fall Risk   Falls in the past year? 1 1 0 0 1  Number falls in past yr: 0 0 0 0 1  Injury with Fall? 0 1 0 0 0  Risk for fall due to : No Fall Risks  No Fall Risks  History of fall(s)  Follow up Falls prevention discussed;Falls evaluation completed  Falls evaluation completed Falls evaluation completed Falls evaluation completed    MEDICARE RISK AT HOME: Medicare Risk at Home Any stairs in or around the home?: No If so, are there any without handrails?: No Home free of loose throw rugs in walkways, pet beds, electrical cords, etc?: Yes Adequate lighting in your home to reduce risk of falls?: Yes Life alert?: No Use of a cane, walker or w/c?: Yes Grab bars in the bathroom?: Yes Shower  chair or bench in shower?: No Elevated toilet seat or a handicapped toilet?: Yes  TIMED UP AND GO:  Was the test performed?  No    Cognitive Function:    04/22/2017  9:24 AM  MMSE - Mini Mental State Exam  Orientation to time 5  Orientation to Place 5  Registration 3  Attention/ Calculation 5  Recall 2  Language- name 2 objects 2  Language- repeat 1  Language- follow 3 step command 3  Language- read & follow direction 1  Write a sentence 1  Copy design 1  Total score 29        08/19/2023    2:10 PM 08/14/2022    3:09 PM  6CIT Screen  What Year? 0 points 0 points  What month? 0 points 0 points  What time? 0 points 0 points  Count back from 20 0 points 0 points  Months in reverse 0 points 0 points  Repeat phrase 0 points 0 points  Total Score 0 points 0 points    Immunizations Immunization History  Administered Date(s) Administered   Fluad Quad(high Dose 65+) 04/21/2019, 03/20/2021   Fluad Trivalent(High Dose 65+) 03/05/2023   Influenza Split 03/24/2012, 03/24/2013   Influenza, High Dose Seasonal PF 04/18/2016, 04/21/2017, 03/24/2018, 04/27/2020   Influenza,inj,Quad PF,6+ Mos 06/21/2022   Influenza-Unspecified 03/21/2014, 03/15/2015, 03/18/2015   Moderna Covid-19 Vaccine Bivalent Booster 41yrs & up 05/07/2021   Moderna SARS-COV2 Booster Vaccination 12/06/2020   Moderna Sars-Covid-2 Vaccination 07/29/2019, 08/27/2019, 04/19/2020   Pneumococcal Conjugate-13 04/18/2016   Pneumococcal Polysaccharide-23 05/22/2017   Tdap 10/02/2021   Zoster Recombinant(Shingrix) 04/21/2019, 06/28/2019   Zoster, Live 04/19/2011    TDAP status: Up to date  Flu Vaccine status: Up to date  Pneumococcal vaccine status: Up to date  Covid-19 vaccine status: Declined, Education has been provided regarding the importance of this vaccine but patient still declined. Advised may receive this vaccine at local pharmacy or Health Dept.or vaccine clinic. Aware to provide a copy of the  vaccination record if obtained from local pharmacy or Health Dept. Verbalized acceptance and understanding.  Qualifies for Shingles Vaccine? Yes   Zostavax completed Yes   Shingrix Completed?: Yes  Screening Tests Health Maintenance  Topic Date Due   FOOT EXAM  02/15/2023   COVID-19 Vaccine (5 - 2024-25 season) 03/03/2024 (Originally 02/23/2023)   HEMOGLOBIN A1C  09/02/2023   OPHTHALMOLOGY EXAM  10/28/2023   Diabetic kidney evaluation - Urine ACR  03/04/2024   Diabetic kidney evaluation - eGFR measurement  07/16/2024   Medicare Annual Wellness (AWV)  08/18/2024   MAMMOGRAM  09/15/2024   Colonoscopy  04/16/2029   DTaP/Tdap/Td (2 - Td or Tdap) 10/03/2031   Pneumonia Vaccine 17+ Years old  Completed   INFLUENZA VACCINE  Completed   DEXA SCAN  Completed   Hepatitis C Screening  Completed   Zoster Vaccines- Shingrix  Completed   HPV VACCINES  Aged Out    Health Maintenance  Health Maintenance Due  Topic Date Due   FOOT EXAM  02/15/2023    Colorectal cancer screening: Type of screening: Colonoscopy. Completed 04/16/22. Repeat every 7 years  Mammogram status: Completed 09/16/22. Repeat every year  Bone Density status: Completed 08/22/22. Results reflect: Bone density results: OSTEOPENIA. Repeat every   years.    Additional Screening:  Hepatitis C Screening: does qualify; Completed 09/02/12  Vision Screening: Recommended annual ophthalmology exams for early detection of glaucoma and other disorders of the eye. Is the patient up to date with their annual eye exam?  Yes  Who is the provider or what is the name of the office in which the patient attends annual eye exams? [facility] If pt is not established with a provider,  would they like to be referred to a provider to establish care? No .   Dental Screening: Recommended annual dental exams for proper oral hygiene  Diabetic Foot Exam: Diabetic Foot Exam: Overdue, Pt has been advised about the importance in completing this  exam. Pt is scheduled for diabetic foot exam on Deferred.  Community Resource Referral / Chronic Care Management:  CRR required this visit?  No   CCM required this visit?  No     Plan:     I have personally reviewed and noted the following in the patient's chart:   Medical and social history Use of alcohol, tobacco or illicit drugs  Current medications and supplements including opioid prescriptions. Patient is not currently taking opioid prescriptions. Functional ability and status Nutritional status Physical activity Advanced directives List of other physicians Hospitalizations, surgeries, and ER visits in previous 12 months Vitals Screenings to include cognitive, depression, and falls Referrals and appointments  In addition, I have reviewed and discussed with patient certain preventive protocols, quality metrics, and best practice recommendations. A written personalized care plan for preventive services as well as general preventive health recommendations were provided to patient.     Tillie Rung, LPN   09/30/8117   After Visit Summary: (MyChart) Due to this being a telephonic visit, the after visit summary with patients personalized plan was offered to patient via MyChart   Nurse Notes: None

## 2023-08-21 ENCOUNTER — Other Ambulatory Visit: Payer: Self-pay | Admitting: *Deleted

## 2023-08-21 DIAGNOSIS — S9031XA Contusion of right foot, initial encounter: Secondary | ICD-10-CM | POA: Diagnosis not present

## 2023-08-21 DIAGNOSIS — M810 Age-related osteoporosis without current pathological fracture: Secondary | ICD-10-CM

## 2023-08-21 MED ORDER — DENOSUMAB 60 MG/ML ~~LOC~~ SOSY: 60.0000 mg | PREFILLED_SYRINGE | SUBCUTANEOUS | Status: DC

## 2023-08-28 ENCOUNTER — Other Ambulatory Visit: Payer: Self-pay | Admitting: Family Medicine

## 2023-09-04 LAB — HEMOGLOBIN A1C: Hemoglobin A1C: 7

## 2023-09-08 ENCOUNTER — Other Ambulatory Visit: Payer: Self-pay

## 2023-09-08 ENCOUNTER — Encounter (HOSPITAL_BASED_OUTPATIENT_CLINIC_OR_DEPARTMENT_OTHER): Payer: Self-pay | Admitting: Emergency Medicine

## 2023-09-08 ENCOUNTER — Emergency Department (HOSPITAL_BASED_OUTPATIENT_CLINIC_OR_DEPARTMENT_OTHER)

## 2023-09-08 ENCOUNTER — Emergency Department (HOSPITAL_BASED_OUTPATIENT_CLINIC_OR_DEPARTMENT_OTHER)
Admission: EM | Admit: 2023-09-08 | Discharge: 2023-09-09 | Disposition: A | Discharge status: Home / Self Care | Attending: Emergency Medicine | Admitting: Emergency Medicine

## 2023-09-08 DIAGNOSIS — Z853 Personal history of malignant neoplasm of breast: Secondary | ICD-10-CM | POA: Insufficient documentation

## 2023-09-08 DIAGNOSIS — Z79899 Other long term (current) drug therapy: Secondary | ICD-10-CM | POA: Insufficient documentation

## 2023-09-08 DIAGNOSIS — Z7951 Long term (current) use of inhaled steroids: Secondary | ICD-10-CM | POA: Insufficient documentation

## 2023-09-08 DIAGNOSIS — Z794 Long term (current) use of insulin: Secondary | ICD-10-CM | POA: Diagnosis not present

## 2023-09-08 DIAGNOSIS — I2510 Atherosclerotic heart disease of native coronary artery without angina pectoris: Secondary | ICD-10-CM | POA: Insufficient documentation

## 2023-09-08 DIAGNOSIS — Z7982 Long term (current) use of aspirin: Secondary | ICD-10-CM | POA: Insufficient documentation

## 2023-09-08 DIAGNOSIS — I110 Hypertensive heart disease with heart failure: Secondary | ICD-10-CM | POA: Diagnosis not present

## 2023-09-08 DIAGNOSIS — E119 Type 2 diabetes mellitus without complications: Secondary | ICD-10-CM | POA: Diagnosis not present

## 2023-09-08 DIAGNOSIS — I5032 Chronic diastolic (congestive) heart failure: Secondary | ICD-10-CM | POA: Diagnosis not present

## 2023-09-08 DIAGNOSIS — R7401 Elevation of levels of liver transaminase levels: Secondary | ICD-10-CM | POA: Diagnosis not present

## 2023-09-08 DIAGNOSIS — R519 Headache, unspecified: Secondary | ICD-10-CM | POA: Insufficient documentation

## 2023-09-08 DIAGNOSIS — Z7984 Long term (current) use of oral hypoglycemic drugs: Secondary | ICD-10-CM | POA: Insufficient documentation

## 2023-09-08 DIAGNOSIS — I6522 Occlusion and stenosis of left carotid artery: Secondary | ICD-10-CM | POA: Diagnosis not present

## 2023-09-08 DIAGNOSIS — J45909 Unspecified asthma, uncomplicated: Secondary | ICD-10-CM | POA: Diagnosis not present

## 2023-09-08 DIAGNOSIS — G9600 Cerebrospinal fluid leak, unspecified: Secondary | ICD-10-CM | POA: Diagnosis not present

## 2023-09-08 DIAGNOSIS — M542 Cervicalgia: Secondary | ICD-10-CM | POA: Diagnosis not present

## 2023-09-08 DIAGNOSIS — I7774 Dissection of vertebral artery: Secondary | ICD-10-CM | POA: Diagnosis not present

## 2023-09-08 LAB — COMPREHENSIVE METABOLIC PANEL
ALT: 40 U/L (ref 0–44)
AST: 42 U/L — ABNORMAL HIGH (ref 15–41)
Albumin: 4.5 g/dL (ref 3.5–5.0)
Alkaline Phosphatase: 57 U/L (ref 38–126)
Anion gap: 11 (ref 5–15)
BUN: 13 mg/dL (ref 8–23)
CO2: 27 mmol/L (ref 22–32)
Calcium: 9.6 mg/dL (ref 8.9–10.3)
Chloride: 98 mmol/L (ref 98–111)
Creatinine, Ser: 0.67 mg/dL (ref 0.44–1.00)
GFR, Estimated: >60 mL/min (ref >60)
Glucose, Bld: 154 mg/dL — ABNORMAL HIGH (ref 70–99)
Potassium: 4.1 mmol/L (ref 3.5–5.1)
Sodium: 136 mmol/L (ref 135–145)
Total Bilirubin: 0.7 mg/dL (ref 0.0–1.2)
Total Protein: 7.9 g/dL (ref 6.5–8.1)

## 2023-09-08 LAB — CBC WITH DIFFERENTIAL/PLATELET
Abs Immature Granulocytes: 0.03 10*3/uL (ref 0.00–0.07)
Basophils Absolute: 0 10*3/uL (ref 0.0–0.1)
Basophils Relative: 0 %
Eosinophils Absolute: 0 10*3/uL (ref 0.0–0.5)
Eosinophils Relative: 0 %
HCT: 44.9 % (ref 36.0–46.0)
Hemoglobin: 14.1 g/dL (ref 12.0–15.0)
Immature Granulocytes: 0 %
Lymphocytes Relative: 15 %
Lymphs Abs: 1.5 10*3/uL (ref 0.7–4.0)
MCH: 27.3 pg (ref 26.0–34.0)
MCHC: 31.4 g/dL (ref 30.0–36.0)
MCV: 87 fL (ref 80.0–100.0)
Monocytes Absolute: 0.5 10*3/uL (ref 0.1–1.0)
Monocytes Relative: 5 %
Neutro Abs: 7.9 10*3/uL — ABNORMAL HIGH (ref 1.7–7.7)
Neutrophils Relative %: 80 %
Platelets: 225 10*3/uL (ref 150–400)
RBC: 5.16 MIL/uL — ABNORMAL HIGH (ref 3.87–5.11)
RDW: 15.5 % (ref 11.5–15.5)
WBC: 10 10*3/uL (ref 4.0–10.5)
nRBC: 0 % (ref 0.0–0.2)

## 2023-09-08 MED ORDER — PROCHLORPERAZINE EDISYLATE 10 MG/2ML IJ SOLN
10.0000 mg | Freq: Once | INTRAMUSCULAR | Status: AC
  Administered 2023-09-08: 10 mg via INTRAVENOUS
  Filled 2023-09-08: qty 2

## 2023-09-08 MED ORDER — KETOROLAC TROMETHAMINE 15 MG/ML IJ SOLN
15.0000 mg | Freq: Once | INTRAMUSCULAR | Status: AC
  Administered 2023-09-08: 15 mg via INTRAVENOUS
  Filled 2023-09-08: qty 1

## 2023-09-08 MED ORDER — IOHEXOL 350 MG/ML SOLN
100.0000 mL | Freq: Once | INTRAVENOUS | Status: AC | PRN
  Administered 2023-09-08: 75 mL via INTRAVENOUS

## 2023-09-08 MED ORDER — LABETALOL HCL 5 MG/ML IV SOLN
10.0000 mg | Freq: Once | INTRAVENOUS | Status: AC
  Administered 2023-09-08: 10 mg via INTRAVENOUS
  Filled 2023-09-08: qty 4

## 2023-09-08 MED ORDER — DIPHENHYDRAMINE HCL 50 MG/ML IJ SOLN
25.0000 mg | Freq: Once | INTRAMUSCULAR | Status: AC
  Administered 2023-09-08: 25 mg via INTRAVENOUS
  Filled 2023-09-08: qty 1

## 2023-09-08 MED ORDER — ACETAMINOPHEN 500 MG PO TABS
1000.0000 mg | ORAL_TABLET | Freq: Once | ORAL | Status: AC
  Administered 2023-09-08: 1000 mg via ORAL
  Filled 2023-09-08: qty 2

## 2023-09-08 NOTE — ED Triage Notes (Signed)
"  Worse headache I've ever had"  Back of the head and into the neck Came on suddenly this AM  "Feels like when I had a CSF leak"

## 2023-09-08 NOTE — ED Notes (Signed)
-  Called [facility] at 933pm for a ENT consult per EDP.

## 2023-09-08 NOTE — Discharge Instructions (Addendum)
 You were evaluated in the emergency room for headache.  Your CT scan did not show any obvious findings. Your case was discussed with ENT [facility] who recommended you follow-up with them tomorrow.  Please call first thing in the morning to schedule appointment.

## 2023-09-08 NOTE — ED Provider Notes (Signed)
 [place] EMERGENCY DEPARTMENT AT [facility] Provider Note   CSN: [id number] Arrival date & time: 09/08/23  1642     History  Chief Complaint  Patient presents with   Headache    [name] is a 73 y.o. female who presents with sudden headache that is the " worst headache I have ever had" started this morning.  Patient has a history of CSF leak.  States this feels very similar.  She has had multiple surgeries for this in the past.  She denies any blurry vision, diplopia, difficulty ambulating, extremity weakness.  She does endorse neck pain that is worse with movement.  Denies any fevers.   Headache  Past Medical History:  Diagnosis Date   Acquired dilation of ascending aorta and aortic root (HCC)    Ascending arctic 43 mm and aortic root 40 mm by chest CTA 09/2022   Anemia 04/05/2012   Anxiety    Anxiety state 09/11/2007   Qualifier: Diagnosis of  By: Everett Graff     Asthma    Atrial tachycardia, paroxysmal (HCC)    Back pain 10/02/2014   Breast cancer (HCC) 02/17/2017   CAD (coronary artery disease), native coronary artery    remote Cath with nonobstructive ASCAD with 20% mid LAD, 20% prox left circ and 20% ostial RCA   Chicken pox as a child   Chronic diastolic CHF (congestive heart failure), NYHA class 1 (HCC)    Complication of anesthesia    pt states feels different in her body after anesthesia when waking up and also experiences a smell of burnt plastic for approx a wk    Constipation    Depression    Diabetes (HCC)    Dilated aortic root (HCC)    42mm by echo 08/2020   Dysuria 02/17/2017   Elevated LFTs 04/05/2012   GERD (gastroesophageal reflux disease)    Goiter    Heart murmur    hx of one at birth    History of kidney stones    History of radiation therapy 10/31/16-12/17/16   left breast 45 Gy in 25 fractions, left breast boost 16 Gy in 8 fractions   Hx of colonic polyps    Hyperlipidemia    Hypertension    Insomnia 10/08/2016    Joint pain    Malignant neoplasm of upper-outer quadrant of left female breast (HCC) 05/14/2016   not with patient   Measles as a child   Mumps as a child   OA (osteoarthritis) of knee 04/05/2012   Obesity 07/11/2014   PONV (postoperative nausea and vomiting)    Preventative health care 09/05/2013   PVC's (premature ventricular contractions) 05/02/2016   Shortness of breath dyspnea    walking distances / climbing stairs   Sleep apnea 04/05/2012   Tinea corporis 02/23/2013   Type 2 diabetes mellitus with hyperglycemia (HCC) 12/31/2013   Vasomotor rhinitis 04/05/2012   Ventral hernia    Wheezing        Home Medications Prior to Admission medications   Medication Sig Start Date End Date Taking? Authorizing Provider  Accu-Chek Softclix Lancets lancets Use to check blood glucose up to twice a day (Dx: Type 2 DM with long term insulin use E11.57, Z79.4) 07/10/22   [name], MD  acetaminophen (TYLENOL) 500 MG tablet Take 1,000 mg by mouth 2 (two) times daily as needed for moderate pain or headache.    [provider]  albuterol (VENTOLIN HFA) 108 (90 Base) MCG/ACT inhaler Inhale  2 puffs every 4-6 hours as needed - rescue. 06/19/20   Waymon Budge, MD  anastrozole (ARIMIDEX) 1 MG tablet TAKE ONE TABLET DAILY 07/02/23   Serena Croissant, MD  aspirin EC 81 MG tablet Take 81 mg by mouth daily. Swallow whole.    [provider]  blood glucose meter kit and supplies Dispense based on patient and insurance preference. Use up to four times daily as directed. (FOR ICD-10 E10.9, E11.9). 08/08/21   [name], MD  budesonide-formoterol [facility]) 160-4.5 MCG/ACT inhaler Inhale 2 puffs into the lungs 2 (two) times daily. As needed    [provider]  busPIRone (BUSPAR) 7.5 MG tablet TAKE ONE TABLET BY MOUTH THREE TIMES DAILY AS NEEDED FOR ANXIETY 10/07/22   [name], MD  carvedilol (COREG) 25 MG tablet Take 1 tablet (25 mg total) by mouth 2 (two) times daily  with a meal. 04/10/23   [name], MD  cholecalciferol (VITAMIN D3) 25 MCG (1000 UNIT) tablet Take 2,000 Units by mouth daily.    [provider]  Continuous Glucose Sensor (FREESTYLE LIBRE 3 SENSOR) MISC 1 each by Does not apply route every 14 (fourteen) days. Use to check blood glucose continuously. Change sensor every 14 days.    [provider]  diltiazem (CARDIZEM CD) 120 MG 24 hr capsule Take 1 capsule (120 mg total) by mouth daily. 08/28/23   [name], MD  escitalopram (LEXAPRO) 20 MG tablet TAKE ONE TABLET EVERY MORNING AND ONE-HALF TABLET EVERY EVENING 08/28/23   [name], MD  esomeprazole (NEXIUM) 40 MG capsule Take 1 capsule (40 mg total) by mouth daily. 07/01/23   [name], MD  ezetimibe (ZETIA) 10 MG tablet Take 1 tablet (10 mg total) by mouth daily. 07/01/23   [name], MD  fenofibrate (TRICOR) 145 MG tablet Take 1 tablet (145 mg total) by mouth daily. 04/10/23   [name], MD  ferrous sulfate 325 (65 FE) MG EC tablet Take 325 mg by mouth every other day.    [provider]  fluticasone (FLONASE) 50 MCG/ACT nasal spray Place 2 sprays into both nostrils daily. 12/22/17   [name], MD  furosemide (LASIX) 20 MG tablet Take 1 tablet (20 mg total) by mouth daily. 08/28/23   [name], MD  glucose blood (ACCU-CHEK AVIVA) test strip Twice daily (please use brand that patient insurance covers) 07/08/22   [name], MD  GNP ULTICARE PEN NEEDLES 32G X 4 MM MISC AS DIRECTED DAILY 02/17/23   Carlus Pavlov, MD  insulin degludec (TRESIBA FLEXTOUCH) 200 UNIT/ML FlexTouch Pen Inject 12-20 Units into the skin daily. Patient taking differently: Inject 8 Units into the skin daily. 02/14/22   Carlus Pavlov, MD  levothyroxine (SYNTHROID) 200 MCG tablet Take 1 tablet (200 mcg total) by mouth daily before breakfast. Take with a tablet daily to equal daily 07/01/23   [name], MD  levothyroxine (SYNTHROID) 50 MCG  tablet Take 1 tablet (50 mcg total) by mouth daily before breakfast. Take with tablet daily to equal 07/01/23   [name], MD  loratadine (CLARITIN) 10 MG tablet Take 1 tablet (10 mg total) by mouth daily. 12/22/17   [name], MD  losartan (COZAAR) 100 MG tablet Take 1 tablet (100 mg total) by mouth daily. 08/28/23   [name], MD  metFORMIN (GLUCOPHAGE-XR) 500 MG 24 hr tablet Take 4 tablets (2,000 mg total)  by mouth daily with supper. 05/21/23   [name], MD  montelukast (SINGULAIR) 10 MG tablet Take 1 tablet (10 mg total) by mouth at bedtime. 03/28/23   [name], MD  rosuvastatin (CRESTOR) 40 MG tablet Take 1 tablet (40 mg total) by mouth daily. 07/01/23   [name], MD  Semaglutide, 1 MG/DOSE, (OZEMPIC, 1 MG/DOSE,) 4 MG/3ML SOPN Inject 1 mg into the skin once a week. 09/03/21   Carlus Pavlov, MD  Vitamin D, Ergocalciferol, (DRISDOL) 1.25 MG (50000 UNIT) CAPS capsule Take 1 capsule (50,000 Units total) by mouth every 7 (seven) days. 10/25/22   [name], MD      Allergies    Oxycodone    Review of Systems   Review of Systems  Neurological:  Positive for headaches.    Physical Exam Updated Vital Signs BP (!) 154/114   Pulse 84   Temp 98.6 F (37 C) (Oral)   Resp (!) 24   SpO2 90%  Physical Exam Vitals and nursing note reviewed.  Constitutional:      General: She is not in acute distress.    Appearance: She is well-developed.  HENT:     Head: Normocephalic and atraumatic.  Eyes:     Conjunctiva/sclera: Conjunctivae normal.  Cardiovascular:     Rate and Rhythm: Normal rate and regular rhythm.     Heart sounds: No murmur heard. Pulmonary:     Effort: Pulmonary effort is normal. No respiratory distress.     Breath sounds: Normal breath sounds. No wheezing or rales.  Abdominal:     Palpations: Abdomen is soft.     Tenderness: There is no abdominal tenderness.  Musculoskeletal:        General: No swelling.     Cervical back:  Neck supple.  Skin:    General: Skin is warm and dry.     Capillary Refill: Capillary refill takes less than 2 seconds.  Neurological:     Mental Status: She is alert.     Comments: Patient is alert and oriented. There is no abnormal phonation. Symmetric smile without facial droop.  Moves all extremities spontaneously. 5/5 strength in upper and lower extremities. . No sensation deficit. There is no nystagmus. EOMI, PERRL. Coordination intact with finger to nose and normal ambulation.    Psychiatric:        Mood and Affect: Mood normal.     ED Results / Procedures / Treatments   Labs (all labs ordered are listed, but only abnormal results are displayed) Labs Reviewed  CBC WITH DIFFERENTIAL/PLATELET - Abnormal; Notable for the following components:      Result Value   RBC 5.16 (*)    Neutro Abs 7.9 (*)    All other components within normal limits  COMPREHENSIVE METABOLIC PANEL - Abnormal; Notable for the following components:   Glucose, Bld 154 (*)    AST 42 (*)    All other components within normal limits    EKG None  Radiology CT ANGIO HEAD NECK W WO CM Result Date: 09/08/2023 CLINICAL DATA:  Provided history: Vertebral artery dissection suspected. Additional history provided: severe headache, history of CSF leak. EXAM: CT ANGIOGRAPHY HEAD AND NECK WITH AND WITHOUT CONTRAST TECHNIQUE: Multidetector CT imaging of the head and neck was performed using the standard protocol during bolus administration of intravenous contrast. Multiplanar CT image reconstructions and MIPs were obtained to evaluate the vascular anatomy. Carotid stenosis measurements (when applicable) are obtained utilizing NASCET criteria, using the distal  internal carotid diameter as the denominator. RADIATION DOSE REDUCTION: This exam was performed according to the departmental dose-optimization program which includes automated exposure control, adjustment of the mA and/or kV according to patient size and/or use of  iterative reconstruction technique. CONTRAST:  75mL OMNIPAQUE IOHEXOL 350 MG/ML SOLN COMPARISON:  Neck CT 06/22/2020. FINDINGS: CT HEAD FINDINGS Brain: Mild generalized parenchymal atrophy. 9 mm fat density focus along the right MCA cistern (series 5, image 15) (series 7, image 50). There is no acute intracranial hemorrhage. No demarcated cortical infarct. No extra-axial fluid collection. No midline shift. Vascular: No hyperdense vessel.  Atherosclerotic calcifications. Skull: No calvarial fracture or aggressive osseous lesion. Hyperostosis frontalis interna. Sinuses/Orbits: No orbital mass or acute orbital finding. Postsurgical appearance of the paranasal sinuses. Mild-to-moderate mucosal thickening within the bilateral maxillary sinuses. Review of the MIP images confirms the above findings CTA NECK FINDINGS Aortic arch: Common origin of the innominate and left common carotid arteries. Atherosclerotic plaque within the visualized thoracic aorta. Streak/beam hardening artifact arising from a dense contrast bolus partially obscures the right subclavian artery. Within this limitation, there is no appreciable hemodynamically significant innominate or proximal subclavian artery stenosis. Right carotid system: CCA and ICA patent within the neck without stenosis. Minimal atherosclerotic plaque about the carotid bifurcation. No evidence of dissection. Tortuosity of the cervical ICA. Left carotid system: CCA and ICA patent within the neck without stenosis or significant atherosclerotic disease. No evidence of dissection. Partially retropharyngeal course of the cervical ICA. Vertebral arteries: Venous reflux of contrast limits evaluation of the V1 and proximal V2 right vertebral artery. Within this limitation, the right vertebral artery is patent within the neck without hemodynamically significant stenosis or evidence of dissection. The left vertebral artery is patent within the neck without hemodynamically significant  stenosis or evidence of dissection. The left vertebral artery is dominant. Skeleton: Spondylosis of the cervical and visualized upper thoracic levels. Congenital C2-C3 non segmentation. No acute fracture or aggressive osseous lesion. Other neck: Prior thyroidectomy. No neck mass or cervical lymphadenopathy. Upper chest: No consolidation within the imaged lung apices. Review of the MIP images confirms the above findings CTA HEAD FINDINGS Anterior circulation: The intracranial internal carotid arteries are patent. Nonstenotic calcified plaque within the paraclinoid left ICA. The M1 middle cerebral arteries are patent. Atherosclerotic irregularity of the M2 and more distal MCA vessels bilaterally. No M2 proximal branch occlusion or high-grade proximal stenosis is identified. The anterior cerebral arteries are patent. Hypoplastic left A1 segment. Posterior circulation: The intracranial vertebral arteries are patent. The basilar artery is patent. The posterior cerebral arteries are patent. The left PCA is fetal in origin. The right posterior communicating artery is diminutive or absent. Venous sinuses: Contrast timing limits evaluation for dural venous sinus thrombosis. Anatomic variants: As described. Review of the MIP images confirms the above findings IMPRESSION: Non-contrast head CT: 1.  No evidence of an acute intracranial abnormality. 2. 9 mm fat density focus along the right MCA cistern most consistent with a lipoma. 3. Mild generalized parenchymal atrophy. 4. Mild-to-moderate mucosal thickening within the bilateral maxillary sinuses. CTA neck: 1. Venous reflux of contrast limits evaluation of the right vertebral artery V1 and proximal V2 segments. Within this limitation, the common carotid, internal carotid and vertebral arteries are patent within the neck without hemodynamically significant stenosis or evidence of dissection. Minimal atherosclerotic plaque about the right carotid bifurcation. 2. Aortic  Atherosclerosis (ICD10-I70.0). 3. Cervical and thoracic spondylosis. CTA head: Intracranial atherosclerotic disease as described. No intracranial large vessel occlusion or proximal  high-grade arterial stenosis identified. Electronically Signed   By: Jackey Loge D.O.   On: 09/08/2023 20:30    Procedures Procedures    Medications Ordered in ED Medications  ketorolac (TORADOL) 15 MG/ML injection 15 mg (has no administration in time range)  iohexol (OMNIPAQUE) 350 MG/ML injection 100 mL (75 mLs Intravenous Contrast Given 09/08/23 1844)  prochlorperazine (COMPAZINE) injection 10 mg (10 mg Intravenous Given 09/08/23 1950)  diphenhydrAMINE (BENADRYL) injection 25 mg (25 mg Intravenous Given 09/08/23 1950)  acetaminophen (TYLENOL) tablet 1,000 mg (1,000 mg Oral Given 09/08/23 2114)  labetalol (NORMODYNE) injection 10 mg (10 mg Intravenous Given 09/08/23 2305)    ED Course/ Medical Decision Making/ A&P                                 Medical Decision Making Amount and/or Complexity of Data Reviewed Labs: ordered. Radiology: ordered.  Risk Prescription drug management.   This patient presents to the ED with chief complaint(s) of headache.  The complaint involves an extensive differential diagnosis and also carries with it a high risk of complications and morbidity.  Pertinent past medical history as listed in HPI  The differential diagnosis includes  Migraine/tension/cluster headache, meningitis, intracranial hemorrhage, CVA, TIA, vertebral dissection, URI, TBI,  CSF leak The initial plan is to   Additional history obtained: Additional history obtained from family Records reviewed Care Everywhere/External Records  Initial Assessment:   Patient presents with complaints of sudden headache.  Headache was sudden and maximal in onset.  It is described as worse of lifetime.  On exam she has no neurodeficits.  She has no meningismus.  She is able to ambulate.  No cough or congestion to suggest  URI.  No injury or trauma to suggest TBI.  Patient did undergo an endoscopy with [facility] otolaryngology on 08/12/2023 with no evidence of CSF leakage.  Headache is positional, appears to be worse with sitting up.  Denies any CSF leakage from the nose.  Independent ECG interpretation:  Sinus rythmn   Independent labs interpretation:  The following labs were independently interpreted:  CBC with no significant findings, CMP with mildly elevated AST  Independent visualization and interpretation of imaging: I independently visualized the following imaging with scope of interpretation limited to determining acute life threatening conditions related to emergency care: CT angio head and neck, which revealed no acute abnormality  Treatment and Reassessment: Patient given Compazine and Benadryl upon first assessment With persistent symptoms patient given Tylenol 1000 mg Patient given shot of Toradol before discharge  Using shared decision-making, patient feels that she can go home.  States her symptoms have improved.  She can ambulate.  Interested in something for pain before discharge.  Consultations obtained:   [facility] ENT Dr. Zoila Shutter unable to definitively schedule an appointment in the morning.  Recommended admission if she does not feel comfortable going home, otherwise discharge with plan to pursue outpatient follow-up.  Disposition:   Patient will be discharged home.  She will call [facility] ENT in the morning. The patient has been appropriately medically screened and/or stabilized in the ED. I have low suspicion for any other emergent medical condition which would require further screening, evaluation or treatment in the ED or require inpatient management. At time of discharge the patient is hemodynamically stable and in no acute distress. I have discussed work-up results and diagnosis with patient and answered all questions. Patient is agreeable with discharge plan. We  discussed strict return  precautions for returning to the emergency department and they verbalized understanding.     Social Determinants of Health:   none  This note was dictated with voice recognition software.  Despite best efforts at proofreading, errors may have occurred which can change the documentation meaning.          Final Clinical Impression(s) / ED Diagnoses Final diagnoses:  Bad headache    Rx / DC Orders ED Discharge Orders     None         Fabienne Bruns 09/08/23 2347    Alvira Monday, MD 09/09/23 [phone]

## 2023-09-08 NOTE — ED Provider Notes (Incomplete)
 Dr. Juleen China  Date of Surgery: 03/07/22  Procedure(s) Performed:  1. Extradural approach to the anterior cranial fossa (CPT I5198920) 2. 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 Pterygomaxillary fossa approach (CPT 31040-R) 4. Right nasal endoscopy with frontal sinusotomy, removal of diseased tissue (CPT 31276-R) 5. Stereotactic Computer assisted naviagtion, extradural (CPT Y7813011)   Operative Findings:  1) Evidence of prior surgery, with previous right nasoseptal flap in place 2) Posterosuperior nasal septal perforation  Component  Diagnosis  A. Sinus contents, trap, endoscopy - Sinus contents with chronic inflammation and mucosal fibrosis  B: Sinus contents, endoscopy - Sinus contents with chronic inflammation and mucosal fibrosis

## 2023-09-09 DIAGNOSIS — Z7982 Long term (current) use of aspirin: Secondary | ICD-10-CM | POA: Diagnosis not present

## 2023-09-09 DIAGNOSIS — K219 Gastro-esophageal reflux disease without esophagitis: Secondary | ICD-10-CM | POA: Diagnosis not present

## 2023-09-09 DIAGNOSIS — E079 Disorder of thyroid, unspecified: Secondary | ICD-10-CM | POA: Diagnosis not present

## 2023-09-09 DIAGNOSIS — G44209 Tension-type headache, unspecified, not intractable: Secondary | ICD-10-CM | POA: Diagnosis not present

## 2023-09-09 DIAGNOSIS — Z79811 Long term (current) use of aromatase inhibitors: Secondary | ICD-10-CM | POA: Diagnosis not present

## 2023-09-09 DIAGNOSIS — E119 Type 2 diabetes mellitus without complications: Secondary | ICD-10-CM | POA: Diagnosis not present

## 2023-09-09 DIAGNOSIS — J45909 Unspecified asthma, uncomplicated: Secondary | ICD-10-CM | POA: Diagnosis not present

## 2023-09-09 DIAGNOSIS — Z794 Long term (current) use of insulin: Secondary | ICD-10-CM | POA: Diagnosis not present

## 2023-09-09 DIAGNOSIS — Z853 Personal history of malignant neoplasm of breast: Secondary | ICD-10-CM | POA: Diagnosis not present

## 2023-09-09 DIAGNOSIS — I110 Hypertensive heart disease with heart failure: Secondary | ICD-10-CM | POA: Diagnosis not present

## 2023-09-09 DIAGNOSIS — Z79899 Other long term (current) drug therapy: Secondary | ICD-10-CM | POA: Diagnosis not present

## 2023-09-09 DIAGNOSIS — I509 Heart failure, unspecified: Secondary | ICD-10-CM | POA: Diagnosis not present

## 2023-09-09 DIAGNOSIS — Z7984 Long term (current) use of oral hypoglycemic drugs: Secondary | ICD-10-CM | POA: Diagnosis not present

## 2023-09-09 DIAGNOSIS — R918 Other nonspecific abnormal finding of lung field: Secondary | ICD-10-CM | POA: Diagnosis not present

## 2023-09-09 DIAGNOSIS — Z7985 Long-term (current) use of injectable non-insulin antidiabetic drugs: Secondary | ICD-10-CM | POA: Diagnosis not present

## 2023-09-09 DIAGNOSIS — Z7951 Long term (current) use of inhaled steroids: Secondary | ICD-10-CM | POA: Diagnosis not present

## 2023-09-10 ENCOUNTER — Telehealth: Payer: Self-pay

## 2023-09-10 NOTE — Transitions of Care (Post Inpatient/ED Visit) (Signed)
   09/10/2023  Name: [name] MRN: [id number] DOB: 1951/05/28  Today's TOC FU Call Status: Today's TOC FU Call Status:: Unsuccessful Call (1st Attempt) Unsuccessful Call (1st Attempt) Date: 09/10/23  Attempted to reach the patient regarding the most recent Inpatient/ED visit.  Follow Up Plan: Additional outreach attempts will be made to reach the patient to complete the Transitions of Care (Post Inpatient/ED visit) call.   Signature Karena Addison, LPN [facility] Nurse Health Advisor Direct Dial [phone]

## 2023-09-11 ENCOUNTER — Emergency Department (HOSPITAL_COMMUNITY)

## 2023-09-11 ENCOUNTER — Other Ambulatory Visit: Payer: Self-pay

## 2023-09-11 ENCOUNTER — Encounter (HOSPITAL_BASED_OUTPATIENT_CLINIC_OR_DEPARTMENT_OTHER): Payer: Self-pay

## 2023-09-11 ENCOUNTER — Emergency Department (EMERGENCY_DEPARTMENT_HOSPITAL)
Admission: EM | Admit: 2023-09-11 | Discharge: 2023-09-12 | Disposition: A | Discharge status: Home / Self Care | Source: Home / Self Care | Attending: Emergency Medicine | Admitting: Emergency Medicine

## 2023-09-11 ENCOUNTER — Ambulatory Visit: Payer: Self-pay

## 2023-09-11 ENCOUNTER — Emergency Department (HOSPITAL_BASED_OUTPATIENT_CLINIC_OR_DEPARTMENT_OTHER)
Admission: EM | Admit: 2023-09-11 | Discharge: 2023-09-11 | Disposition: A | Discharge status: Home / Self Care | Attending: Emergency Medicine | Admitting: Emergency Medicine

## 2023-09-11 DIAGNOSIS — Z7984 Long term (current) use of oral hypoglycemic drugs: Secondary | ICD-10-CM | POA: Diagnosis not present

## 2023-09-11 DIAGNOSIS — G4489 Other headache syndrome: Secondary | ICD-10-CM | POA: Diagnosis not present

## 2023-09-11 DIAGNOSIS — Z1152 Encounter for screening for COVID-19: Secondary | ICD-10-CM | POA: Insufficient documentation

## 2023-09-11 DIAGNOSIS — Z79899 Other long term (current) drug therapy: Secondary | ICD-10-CM | POA: Insufficient documentation

## 2023-09-11 DIAGNOSIS — E119 Type 2 diabetes mellitus without complications: Secondary | ICD-10-CM | POA: Diagnosis not present

## 2023-09-11 DIAGNOSIS — Z794 Long term (current) use of insulin: Secondary | ICD-10-CM | POA: Insufficient documentation

## 2023-09-11 DIAGNOSIS — I10 Essential (primary) hypertension: Secondary | ICD-10-CM | POA: Insufficient documentation

## 2023-09-11 DIAGNOSIS — I629 Nontraumatic intracranial hemorrhage, unspecified: Secondary | ICD-10-CM | POA: Diagnosis not present

## 2023-09-11 DIAGNOSIS — Z7982 Long term (current) use of aspirin: Secondary | ICD-10-CM | POA: Insufficient documentation

## 2023-09-11 DIAGNOSIS — Z743 Need for continuous supervision: Secondary | ICD-10-CM | POA: Diagnosis not present

## 2023-09-11 DIAGNOSIS — I110 Hypertensive heart disease with heart failure: Secondary | ICD-10-CM | POA: Diagnosis not present

## 2023-09-11 DIAGNOSIS — R791 Abnormal coagulation profile: Secondary | ICD-10-CM | POA: Insufficient documentation

## 2023-09-11 DIAGNOSIS — R531 Weakness: Secondary | ICD-10-CM | POA: Insufficient documentation

## 2023-09-11 DIAGNOSIS — G9600 Cerebrospinal fluid leak, unspecified: Secondary | ICD-10-CM

## 2023-09-11 DIAGNOSIS — R519 Headache, unspecified: Secondary | ICD-10-CM | POA: Insufficient documentation

## 2023-09-11 DIAGNOSIS — G936 Cerebral edema: Secondary | ICD-10-CM | POA: Diagnosis not present

## 2023-09-11 DIAGNOSIS — I5032 Chronic diastolic (congestive) heart failure: Secondary | ICD-10-CM | POA: Diagnosis not present

## 2023-09-11 DIAGNOSIS — R197 Diarrhea, unspecified: Secondary | ICD-10-CM | POA: Insufficient documentation

## 2023-09-11 DIAGNOSIS — R479 Unspecified speech disturbances: Secondary | ICD-10-CM | POA: Insufficient documentation

## 2023-09-11 DIAGNOSIS — R29810 Facial weakness: Secondary | ICD-10-CM | POA: Insufficient documentation

## 2023-09-11 DIAGNOSIS — R0689 Other abnormalities of breathing: Secondary | ICD-10-CM | POA: Diagnosis not present

## 2023-09-11 DIAGNOSIS — G9601 Cranial cerebrospinal fluid leak, spontaneous: Secondary | ICD-10-CM | POA: Diagnosis not present

## 2023-09-11 DIAGNOSIS — R29818 Other symptoms and signs involving the nervous system: Secondary | ICD-10-CM | POA: Diagnosis not present

## 2023-09-11 DIAGNOSIS — R4781 Slurred speech: Secondary | ICD-10-CM | POA: Diagnosis not present

## 2023-09-11 DIAGNOSIS — I639 Cerebral infarction, unspecified: Secondary | ICD-10-CM | POA: Diagnosis not present

## 2023-09-11 DIAGNOSIS — I6782 Cerebral ischemia: Secondary | ICD-10-CM | POA: Diagnosis not present

## 2023-09-11 DIAGNOSIS — R6889 Other general symptoms and signs: Secondary | ICD-10-CM | POA: Diagnosis not present

## 2023-09-11 LAB — COMPREHENSIVE METABOLIC PANEL
ALT: 30 U/L (ref 0–44)
ALT: 35 U/L (ref 0–44)
AST: 41 U/L (ref 15–41)
AST: 48 U/L — ABNORMAL HIGH (ref 15–41)
Albumin: 4.4 g/dL (ref 3.5–5.0)
Albumin: 3.6 g/dL (ref 3.5–5.0)
Alkaline Phosphatase: 66 U/L (ref 38–126)
Alkaline Phosphatase: 55 U/L (ref 38–126)
Anion gap: 11 (ref 5–15)
Anion gap: 11 (ref 5–15)
BUN: 16 mg/dL (ref 8–23)
BUN: 14 mg/dL (ref 8–23)
CO2: 27 mmol/L (ref 22–32)
CO2: 23 mmol/L (ref 22–32)
Calcium: 9.5 mg/dL (ref 8.9–10.3)
Calcium: 9.5 mg/dL (ref 8.9–10.3)
Chloride: 100 mmol/L (ref 98–111)
Chloride: 102 mmol/L (ref 98–111)
Creatinine, Ser: 0.73 mg/dL (ref 0.44–1.00)
Creatinine, Ser: 0.79 mg/dL (ref 0.44–1.00)
GFR, Estimated: >60 mL/min (ref >60)
GFR, Estimated: >60 mL/min (ref >60)
Glucose, Bld: 128 mg/dL — ABNORMAL HIGH (ref 70–99)
Glucose, Bld: 134 mg/dL — ABNORMAL HIGH (ref 70–99)
Potassium: 3.8 mmol/L (ref 3.5–5.1)
Potassium: 3.9 mmol/L (ref 3.5–5.1)
Sodium: 138 mmol/L (ref 135–145)
Sodium: 136 mmol/L (ref 135–145)
Total Bilirubin: 0.5 mg/dL (ref 0.0–1.2)
Total Bilirubin: 0.7 mg/dL (ref 0.0–1.2)
Total Protein: 7.6 g/dL (ref 6.5–8.1)
Total Protein: 7.6 g/dL (ref 6.5–8.1)

## 2023-09-11 LAB — I-STAT CHEM 8, ED
BUN: 16 mg/dL (ref 8–23)
Calcium, Ion: 1.13 mmol/L — ABNORMAL LOW (ref 1.15–1.40)
Chloride: 102 mmol/L (ref 98–111)
Creatinine, Ser: 0.7 mg/dL (ref 0.44–1.00)
Glucose, Bld: 131 mg/dL — ABNORMAL HIGH (ref 70–99)
HCT: 43 % (ref 36.0–46.0)
Hemoglobin: 14.6 g/dL (ref 12.0–15.0)
Potassium: 3.9 mmol/L (ref 3.5–5.1)
Sodium: 137 mmol/L (ref 135–145)
TCO2: 24 mmol/L (ref 22–32)

## 2023-09-11 LAB — RESP PANEL BY RT-PCR (RSV, FLU A&B, COVID)  RVPGX2
Influenza A by PCR: NEGATIVE
Influenza B by PCR: NEGATIVE
Resp Syncytial Virus by PCR: NEGATIVE
SARS Coronavirus 2 by RT PCR: NEGATIVE

## 2023-09-11 LAB — CBC
HCT: 42.1 % (ref 36.0–46.0)
Hemoglobin: 13 g/dL (ref 12.0–15.0)
MCH: 27.1 pg (ref 26.0–34.0)
MCHC: 30.9 g/dL (ref 30.0–36.0)
MCV: 87.7 fL (ref 80.0–100.0)
Platelets: 255 10*3/uL (ref 150–400)
RBC: 4.8 MIL/uL (ref 3.87–5.11)
RDW: 15.6 % — ABNORMAL HIGH (ref 11.5–15.5)
WBC: 9.4 10*3/uL (ref 4.0–10.5)
nRBC: 0 % (ref 0.0–0.2)

## 2023-09-11 LAB — DIFFERENTIAL
Abs Immature Granulocytes: 0.02 10*3/uL (ref 0.00–0.07)
Basophils Absolute: 0 10*3/uL (ref 0.0–0.1)
Basophils Relative: 0 %
Eosinophils Absolute: 0 10*3/uL (ref 0.0–0.5)
Eosinophils Relative: 0 %
Immature Granulocytes: 0 %
Lymphocytes Relative: 16 %
Lymphs Abs: 1.5 10*3/uL (ref 0.7–4.0)
Monocytes Absolute: 0.6 10*3/uL (ref 0.1–1.0)
Monocytes Relative: 7 %
Neutro Abs: 7.2 10*3/uL (ref 1.7–7.7)
Neutrophils Relative %: 77 %

## 2023-09-11 LAB — CBC WITH DIFFERENTIAL/PLATELET
Abs Immature Granulocytes: 0.02 10*3/uL (ref 0.00–0.07)
Basophils Absolute: 0 10*3/uL (ref 0.0–0.1)
Basophils Relative: 0 %
Eosinophils Absolute: 0 10*3/uL (ref 0.0–0.5)
Eosinophils Relative: 0 %
HCT: 40 % (ref 36.0–46.0)
Hemoglobin: 12.6 g/dL (ref 12.0–15.0)
Immature Granulocytes: 0 %
Lymphocytes Relative: 17 %
Lymphs Abs: 1.3 10*3/uL (ref 0.7–4.0)
MCH: 27.3 pg (ref 26.0–34.0)
MCHC: 31.5 g/dL (ref 30.0–36.0)
MCV: 86.8 fL (ref 80.0–100.0)
Monocytes Absolute: 0.5 10*3/uL (ref 0.1–1.0)
Monocytes Relative: 6 %
Neutro Abs: 6 10*3/uL (ref 1.7–7.7)
Neutrophils Relative %: 77 %
Platelets: 213 10*3/uL (ref 150–400)
RBC: 4.61 MIL/uL (ref 3.87–5.11)
RDW: 15.7 % — ABNORMAL HIGH (ref 11.5–15.5)
WBC: 7.8 10*3/uL (ref 4.0–10.5)
nRBC: 0 % (ref 0.0–0.2)

## 2023-09-11 LAB — PROTIME-INR
INR: 1.1 (ref 0.8–1.2)
Prothrombin Time: 14 s (ref 11.4–15.2)

## 2023-09-11 LAB — ETHANOL: Alcohol, Ethyl (B): <10 mg/dL (ref <10)

## 2023-09-11 LAB — URINALYSIS, ROUTINE W REFLEX MICROSCOPIC
Bacteria, UA: NONE SEEN
Bilirubin Urine: NEGATIVE
Glucose, UA: NEGATIVE mg/dL
Ketones, ur: NEGATIVE mg/dL
Nitrite: NEGATIVE
Protein, ur: NEGATIVE mg/dL
Specific Gravity, Urine: 1.012 (ref 1.005–1.030)
pH: 5 (ref 5.0–8.0)

## 2023-09-11 LAB — APTT: aPTT: 29 s (ref 24–36)

## 2023-09-11 LAB — MAGNESIUM: Magnesium: 1.9 mg/dL (ref 1.7–2.4)

## 2023-09-11 LAB — CBG MONITORING, ED: Glucose-Capillary: 137 mg/dL — ABNORMAL HIGH (ref 70–99)

## 2023-09-11 LAB — LIPASE, BLOOD: Lipase: 33 U/L (ref 11–51)

## 2023-09-11 MED ORDER — IOHEXOL 350 MG/ML SOLN
75.0000 mL | Freq: Once | INTRAVENOUS | Status: AC | PRN
  Administered 2023-09-11: 75 mL via INTRAVENOUS

## 2023-09-11 MED ORDER — KETOROLAC TROMETHAMINE 15 MG/ML IJ SOLN
15.0000 mg | Freq: Once | INTRAMUSCULAR | Status: AC
  Administered 2023-09-11: 15 mg via INTRAVENOUS
  Filled 2023-09-11: qty 1

## 2023-09-11 MED ORDER — GADOBUTROL 1 MMOL/ML IV SOLN
10.0000 mL | Freq: Once | INTRAVENOUS | Status: AC | PRN
  Administered 2023-09-11: 10 mL via INTRAVENOUS

## 2023-09-11 MED ORDER — ONDANSETRON HCL 4 MG/2ML IJ SOLN
4.0000 mg | Freq: Once | INTRAMUSCULAR | Status: AC
  Administered 2023-09-11: 4 mg via INTRAVENOUS
  Filled 2023-09-11: qty 2

## 2023-09-11 NOTE — ED Triage Notes (Signed)
 Patient arrives with complaints of ongoing diarrhea x6 days. Patient was sent here by her PCP to have a stool specimen evaluated. Patient also reports ongoing headaches as well that she was seen at [facility] for.

## 2023-09-11 NOTE — Discharge Instructions (Signed)
 Keep your appointment with your primary care doctor for next week.  If the respiratory panel is negative I would like for you to call your primary care doctor to get some stool cultures sent to you so you can provide a stool specimen to look for other causes.  Please return to the emergency department for any worsening symptoms.

## 2023-09-11 NOTE — ED Provider Notes (Incomplete)
 11:01 PM Assumed care from Dr. Andria Meuse, please see their note for full history, physical and decision making until this point. In brief this is a 73 y.o. year old female who presented to the ED tonight with No chief complaint on file.     Pending workup for admission.   MRI shows concerns for possible CSF leak and associated decompression edema/hemorrhage.  Discussed with the ENT resident at [facility] per patient request and stated they needed to speak to a attending for transfer.  I discussed with Dr.Attiah with the neurosurgery service who stated that since she had neurodeficit with the small amount of bleed that they would be happy to see her and come up with a disposition.  Recommended ER to ER transfer.  Dr. Laurell Josephs accepting physician in the ER.  Patient is vitally stable at time of discharge.  N.p.o. since arrival here approximate 6 hours ago.  No new neurodeficits but is still having left arm weakness.  Did have headache earlier but fentanyl provided which helped quite a bit.  Resting comfortably.  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  [employer] certification filled out. Did ask if patient needed antibiotics or steroids and Dr. Abbott Pao felt that no further interventions were recommended at this time at least until they could see her.    Labs, studies and imaging reviewed by myself and considered in medical decision making if ordered. Imaging interpreted by radiology.  Labs Reviewed  CBC - Abnormal; Notable for the following components:      Result Value   RDW 15.6 (*)    All other components within normal limits  COMPREHENSIVE METABOLIC PANEL - Abnormal; Notable for the following components:   Glucose, Bld 134 (*)    AST 48 (*)    All other components within normal limits  I-STAT CHEM 8, ED - Abnormal; Notable for the following components:   Glucose, Bld 131 (*)    Calcium, Ion 1.13 (*)    All other components within normal limits  CBG MONITORING, ED - Abnormal; Notable for the following  components:   Glucose-Capillary 137 (*)    All other components within normal limits  ETHANOL  PROTIME-INR  APTT  DIFFERENTIAL  RAPID URINE DRUG SCREEN, HOSP PERFORMED  URINALYSIS, ROUTINE W REFLEX MICROSCOPIC    CT ANGIO HEAD NECK W WO CM (CODE STROKE)  Final Result    CT HEAD CODE STROKE WO CONTRAST  Final Result    MR BRAIN W WO CONTRAST    (Results Pending)    No follow-ups on file.    Ashonti Leandro, Barbara Cower, MD 09/12/23 [id number]    Marily Memos, MD 09/12/23 [phone]

## 2023-09-11 NOTE — ED Triage Notes (Signed)
 BIB EMS for stroke like symptoms, LKN 1815, Left sided arm weakness and Left sided facial droop. Headache for the past 5 days, Denies blood thinners, takes daily ASA 81mg . CBG 146 with EMS.

## 2023-09-11 NOTE — Code Documentation (Signed)
 Stroke Response Nurse Documentation Code Documentation  [name] is a 73 y.o. female arriving to [facility]  via [facility] on 3/20 with past medical hx of HLD, Obesity, HTN, DM, breast cancer. On No antithrombotic. Code stroke was activated by EMS.   Patient from home where she was LKW at 1615 and now complaining of headache, left facial droop and left sided weakness.   Stroke team at the bedside on patient arrival. Labs drawn and patient cleared for CT by Dr. Andria Meuse. Patient to CT with team. NIHSS 3, see documentation for details and code stroke times. Patient with left facial droop, left arm weakness, and left neglect on exam. The following imaging was completed:  CT Head and CTA. Patient is not a candidate for IV Thrombolytic due to hemorrhage. Patient is not a candidate for IR due to No LVO. Process Delays Noted: IV access  Bedside handoff with ED RN Kipp Brood.    Rose Fillers  Rapid Response RN

## 2023-09-11 NOTE — ED Provider Notes (Signed)
 [place] EMERGENCY DEPARTMENT AT [facility] Provider Note   CSN: [id number] Arrival date & time: 09/11/23  1424     History Chief Complaint  Patient presents with   Diarrhea   Headache    [name] is a 73 y.o. female with history of type 2 diabetes, hypertension, hyperlipidemia, CSF leak with most recent repair at [facility] back in 2023 who presents to the emergency department with 6-day history of mucousy diarrhea.  Patient states she has been on 4 rounds of antibiotics in the last 6 months.  She states that she did have a very severe headache on Monday in which she was evaluated at [facility].  They evaluated her patch where her CSF leak was previously and everything was stable per caretaker and chart review.  Patient states she no longer has a severe headache.  Patient has had 3 episodes of diarrhea today.  She also states that her urine has been darker.  She denies any chest pain, shortness of breath, chills, abdominal pain, nausea, vomiting.  Does endorse low-grade fever.   Diarrhea Associated symptoms: headaches   Headache Associated symptoms: diarrhea        Home Medications Prior to Admission medications   Medication Sig Start Date End Date Taking? Authorizing Provider  Accu-Chek Softclix Lancets lancets Use to check blood glucose up to twice a day (Dx: Type 2 DM with long term insulin use E11.57, Z79.4) 07/10/22   [name], MD  acetaminophen (TYLENOL) 500 MG tablet Take 1,000 mg by mouth 2 (two) times daily as needed for moderate pain or headache.    [provider]  albuterol (VENTOLIN HFA) 108 (90 Base) MCG/ACT inhaler Inhale 2 puffs every 4-6 hours as needed - rescue. 06/19/20   Waymon Budge, MD  anastrozole (ARIMIDEX) 1 MG tablet TAKE ONE TABLET DAILY 07/02/23   Serena Croissant, MD  aspirin EC 81 MG tablet Take 81 mg by mouth daily. Swallow whole.    [provider]  blood glucose meter kit and supplies Dispense based on patient and  insurance preference. Use up to four times daily as directed. (FOR ICD-10 E10.9, E11.9). 08/08/21   [name], MD  budesonide-formoterol [facility]) 160-4.5 MCG/ACT inhaler Inhale 2 puffs into the lungs 2 (two) times daily. As needed    [provider]  busPIRone (BUSPAR) 7.5 MG tablet TAKE ONE TABLET BY MOUTH THREE TIMES DAILY AS NEEDED FOR ANXIETY 10/07/22   [name], MD  carvedilol (COREG) 25 MG tablet Take 1 tablet (25 mg total) by mouth 2 (two) times daily with a meal. 04/10/23   [name], MD  cholecalciferol (VITAMIN D3) 25 MCG (1000 UNIT) tablet Take 2,000 Units by mouth daily.    [provider]  Continuous Glucose Sensor (FREESTYLE LIBRE 3 SENSOR) MISC 1 each by Does not apply route every 14 (fourteen) days. Use to check blood glucose continuously. Change sensor every 14 days.    [provider]  diltiazem (CARDIZEM CD) 120 MG 24 hr capsule Take 1 capsule (120 mg total) by mouth daily. 08/28/23   [name], MD  escitalopram (LEXAPRO) 20 MG tablet TAKE ONE TABLET EVERY MORNING AND ONE-HALF TABLET EVERY EVENING 08/28/23   [name], MD  esomeprazole (NEXIUM) 40 MG capsule Take 1 capsule (40 mg total) by mouth daily. 07/01/23   [name], MD  ezetimibe (ZETIA) 10 MG tablet Take 1 tablet (10 mg total) by mouth daily. 07/01/23   Abner Greenspan,  Bryon Lions, MD  fenofibrate (TRICOR) 145 MG tablet Take 1 tablet (145 mg total) by mouth daily. 04/10/23   [name], MD  ferrous sulfate 325 (65 FE) MG EC tablet Take 325 mg by mouth every other day.    [provider]  fluticasone (FLONASE) 50 MCG/ACT nasal spray Place 2 sprays into both nostrils daily. 12/22/17   [name], MD  furosemide (LASIX) 20 MG tablet Take 1 tablet (20 mg total) by mouth daily. 08/28/23   [name], MD  glucose blood (ACCU-CHEK AVIVA) test strip Twice daily (please use brand that patient insurance covers) 07/08/22   [name], MD  GNP ULTICARE PEN NEEDLES 32G  X 4 MM MISC AS DIRECTED DAILY 02/17/23   Carlus Pavlov, MD  insulin degludec (TRESIBA FLEXTOUCH) 200 UNIT/ML FlexTouch Pen Inject 12-20 Units into the skin daily. Patient taking differently: Inject 8 Units into the skin daily. 02/14/22   Carlus Pavlov, MD  levothyroxine (SYNTHROID) 200 MCG tablet Take 1 tablet (200 mcg total) by mouth daily before breakfast. Take with a tablet daily to equal daily 07/01/23   [name], MD  levothyroxine (SYNTHROID) 50 MCG tablet Take 1 tablet (50 mcg total) by mouth daily before breakfast. Take with tablet daily to equal 07/01/23   [name], MD  loratadine (CLARITIN) 10 MG tablet Take 1 tablet (10 mg total) by mouth daily. 12/22/17   [name], MD  losartan (COZAAR) 100 MG tablet Take 1 tablet (100 mg total) by mouth daily. 08/28/23   [name], MD  metFORMIN (GLUCOPHAGE-XR) 500 MG 24 hr tablet Take 4 tablets (2,000 mg total) by mouth daily with supper. 05/21/23   [name], MD  montelukast (SINGULAIR) 10 MG tablet Take 1 tablet (10 mg total) by mouth at bedtime. 03/28/23   [name], MD  rosuvastatin (CRESTOR) 40 MG tablet Take 1 tablet (40 mg total) by mouth daily. 07/01/23   [name], MD  Semaglutide, 1 MG/DOSE, (OZEMPIC, 1 MG/DOSE,) 4 MG/3ML SOPN Inject 1 mg into the skin once a week. 09/03/21   Carlus Pavlov, MD  Vitamin D, Ergocalciferol, (DRISDOL) 1.25 MG (50000 UNIT) CAPS capsule Take 1 capsule (50,000 Units total) by mouth every 7 (seven) days. 10/25/22   [name], MD      Allergies    Oxycodone    Review of Systems   Review of Systems  Gastrointestinal:  Positive for diarrhea.  Neurological:  Positive for headaches.  All other systems reviewed and are negative.   Physical Exam Updated Vital Signs BP (!) 196/107   Pulse 69   Temp 99 F (37.2 C) (Oral)   Resp 16   Ht 5\' 5"  (1.651 m)   Wt 133.4 kg   SpO2 (!) 88%   BMI 48.94 kg/m  Physical Exam Vitals and nursing note  reviewed.  Constitutional:      General: She is not in acute distress.    Appearance: Normal appearance.  HENT:     Head: Normocephalic and atraumatic.  Eyes:     General:        Right eye: No discharge.        Left eye: No discharge.  Cardiovascular:     Comments: Regular rate and rhythm.  S1/S2 are distinct without any evidence of murmur, rubs, or gallops.  Radial pulses are 2+ bilaterally.  Dorsalis pedis pulses are 2+ bilaterally.  No evidence of pedal  edema. Pulmonary:     Comments: Clear to auscultation bilaterally.  Normal effort.  No respiratory distress.  No evidence of wheezes, rales, or rhonchi heard throughout. Abdominal:     General: Abdomen is flat. Bowel sounds are normal. There is no distension.     Tenderness: There is no abdominal tenderness. There is no guarding or rebound.  Musculoskeletal:        General: Normal range of motion.     Cervical back: Neck supple.  Skin:    General: Skin is warm and dry.     Findings: No rash.  Neurological:     General: No focal deficit present.     Mental Status: She is alert.  Psychiatric:        Mood and Affect: Mood normal.        Behavior: Behavior normal.     ED Results / Procedures / Treatments   Labs (all labs ordered are listed, but only abnormal results are displayed) Labs Reviewed  CBC WITH DIFFERENTIAL/PLATELET - Abnormal; Notable for the following components:      Result Value   RDW 15.7 (*)    All other components within normal limits  COMPREHENSIVE METABOLIC PANEL - Abnormal; Notable for the following components:   Glucose, Bld 128 (*)    All other components within normal limits  URINALYSIS, ROUTINE W REFLEX MICROSCOPIC - Abnormal; Notable for the following components:   Hgb urine dipstick MODERATE (*)    Leukocytes,Ua MODERATE (*)    All other components within normal limits  STOOL CULTURE  C DIFFICILE QUICK SCREEN W PCR REFLEX    RESP PANEL BY RT-PCR (RSV, FLU A&B, COVID)  [id number]  LIPASE, BLOOD   MAGNESIUM    EKG None  Radiology No results found.  Procedures Procedures    Medications Ordered in ED Medications  ketorolac (TORADOL) 15 MG/ML injection 15 mg (15 mg Intravenous Given 09/11/23 1622)  ondansetron (ZOFRAN) injection 4 mg (4 mg Intravenous Given 09/11/23 1624)    ED Course/ Medical Decision Making/ A&P Clinical Course as of 09/11/23 1719  Thu Sep 11, 2023  1714 Shared decision making was done with the patient.  She wishes to go home which I think is reasonable.  I will get a respiratory panel for her looking for other viral causes of her diarrhea.  She will follow-up with these results on MyChart.  If her results are negative, I advised her to contact her primary care doctor to get a stool specimen cup where she can provide a stool specimen and get it cultured for other potential viral pathogens and/or potential C. difficile.  Patient and daughter are agreeable with plan.  I went over all labs with her at the bedside.  All questions and concerns addressed. [CF]  1715 CBC with Differential(!) Negative. [CF]  1718 Comprehensive metabolic panel(!) Negative. [CF]  1718 Urinalysis, Routine w reflex microscopic -Urine, Clean Catch(!) Questionably infected.  Asymptomatic.  Will hold off on treatment. [CF]  1718 Lipase, blood Negative. [CF]  1718 Magnesium Negative. [CF]    Clinical Course User Index [CF] Teressa Lower, PA-C   {   Click here for ABCD2, HEART and other calculators  Medical Decision Making [name] is a 73 y.o. female patient who presents to the emergency department today for further evaluation of diarrhea.  Given her antibiotic usage over the last 6 months I am concerned for C. difficile infection.  Will obtain a stool culture and C. difficile panel if she  can produce a sample.  Patient is nontender in the abdomen do not feel imaging is warranted at this time.  All labs are reassuring.  Patient unable to produce a stool specimen here  right now.  As highlighted in the ED course I will have her follow-up with her primary care doctor.  She does have an appointment for next week.  She is safe for discharge at this time.  Amount and/or Complexity of Data Reviewed Labs: ordered. Decision-making details documented in ED Course.  Risk Prescription drug management.    Final Clinical Impression(s) / ED Diagnoses Final diagnoses:  Diarrhea, unspecified type    Rx / DC Orders ED Discharge Orders     None         Teressa Lower, [place] 09/11/23 1719    Terald Sleeper, MD 09/11/23 2016

## 2023-09-11 NOTE — Consult Note (Incomplete)
 NEUROLOGY CONSULT NOTE   Date of service: September 11, 2023 Patient Name: [name] MRN:  [id number] DOB:  May 21, 1951 Chief Complaint: "Headache, left-sided weakness" Requesting Provider: Arletha Pili, DO  History of Present Illness  [name] is a 73 y.o. female with hx of paroxysmal atrial tachycardia, coronary artery disease, chronic diastolic CHF, depression, anxiety, diabetes, hypertension, hyperlipidemia, left breast cancer in 2017, obesity, sleep apnea, CSF leak headaches-follows with [facility] and has patches for treatment of CSF leak, presenting for evaluation of strokelike symptoms via EMS. She reports that since Sunday, 09/07/2023, she has had a bad headache, which only got somewhat relieved when she went to the ER this morning and got a Toradol shot.  She also has had diarrhea now for a few days.  Sometime around this evening, her headache worsened and family noted some left-sided facial droop and left sided weakness.  She was brought in for emergent stroke evaluation. Stat head CT had concerning findings of a hemorrhagic contusion versus small area of hemorrhage around an ischemic infarct with edema which needs further imaging with MRI. Last known well was said to be 6:15 PM but she has been unwell now for multiple days hence the last known well is unclear at this time.   LKW: Unclear Modified rankin score: 2-Slight disability-UNABLE to perform all activities but does not need assistance IV Thrombolysis: Evidence of bleed in the right temporal lobe EVT: Same as above ICH Score:0 NIH stroke scale 3   ROS  Comprehensive ROS performed and pertinent positives documented in HPI    Past History   Past Medical History:  Diagnosis Date   Acquired dilation of ascending aorta and aortic root (HCC)    Ascending arctic 43 mm and aortic root 40 mm by chest CTA 09/2022   Anemia 04/05/2012   Anxiety    Anxiety state 09/11/2007   Qualifier: Diagnosis of  By: Everett Graff     Asthma    Atrial tachycardia, paroxysmal (HCC)    Back pain 10/02/2014   Breast cancer (HCC) 02/17/2017   CAD (coronary artery disease), native coronary artery    remote Cath with nonobstructive ASCAD with 20% mid LAD, 20% prox left circ and 20% ostial RCA   Chicken pox as a child   Chronic diastolic CHF (congestive heart failure), NYHA class 1 (HCC)    Complication of anesthesia    pt states feels different in her body after anesthesia when waking up and also experiences a smell of burnt plastic for approx a wk    Constipation    Depression    Diabetes (HCC)    Dilated aortic root (HCC)    42mm by echo 08/2020   Dysuria 02/17/2017   Elevated LFTs 04/05/2012   GERD (gastroesophageal reflux disease)    Goiter    Heart murmur    hx of one at birth    History of kidney stones    History of radiation therapy 10/31/16-12/17/16   left breast 45 Gy in 25 fractions, left breast boost 16 Gy in 8 fractions   Hx of colonic polyps    Hyperlipidemia    Hypertension    Insomnia 10/08/2016   Joint pain    Malignant neoplasm of upper-outer quadrant of left female breast (HCC) 05/14/2016   not with patient   Measles as a child   Mumps as a child   OA (osteoarthritis) of knee 04/05/2012   Obesity 07/11/2014   PONV (postoperative nausea and  vomiting)    Preventative health care 09/05/2013   PVC's (premature ventricular contractions) 05/02/2016   Shortness of breath dyspnea    walking distances / climbing stairs   Sleep apnea 04/05/2012   Tinea corporis 02/23/2013   Type 2 diabetes mellitus with hyperglycemia (HCC) 12/31/2013   Vasomotor rhinitis 04/05/2012   Ventral hernia    Wheezing     Past Surgical History:  Procedure Laterality Date   BREAST LUMPECTOMY Left 2017   BREAST LUMPECTOMY WITH RADIOACTIVE SEED AND SENTINEL LYMPH NODE BIOPSY Left 06/21/2016   Procedure: LEFT BREAST LUMPECTOMY WITH RADIOACTIVE SEED AND SENTINEL LYMPH NODE BIOPSY, INJECT BLUE DYE LEFT BREAST;   Surgeon: Claud Kelp, MD;  Location: [facility] OR;  Service: General;  Laterality: Left;   CARDIAC CATHETERIZATION     normal coroary arteries per patient   CARDIOVASCULAR STRESS TEST     10/12/2013   CESAREAN SECTION     X 3   CHOLECYSTECTOMY     COLONOSCOPY WITH PROPOFOL N/A 03/28/2015   Procedure: COLONOSCOPY WITH PROPOFOL;  Surgeon: Charna Elizabeth, MD;  Location: [facility] ENDOSCOPY;  Service: Endoscopy;  Laterality: N/A;   HERNIA REPAIR  02/08/2011   ventral hernia   JOINT REPLACEMENT     bilateral   KNEE ARTHROSCOPY  05/2010   bilateral   LEFT AND RIGHT HEART CATHETERIZATION WITH CORONARY ANGIOGRAM N/A 11/08/2013   Procedure: LEFT AND RIGHT HEART CATHETERIZATION WITH CORONARY ANGIOGRAM;  Surgeon: Kathleene Hazel, MD;  Location: [facility] CATH LAB;  Service: Cardiovascular;  Laterality: N/A;   MENISCUS REPAIR  2009   MOUTH SURGERY     teeth implants   PORT-A-CATH REMOVAL N/A 01/24/2017   Procedure: REMOVAL PORT-A-CATH;  Surgeon: Claud Kelp, MD;  Location: [facility] OR;  Service: General;  Laterality: N/A;   PORTACATH PLACEMENT Right 07/16/2016   Procedure: INSERTION PORT-A-CATH RIGHT INTERNAL JUGULAR WITH ULTRASOUND;  Surgeon: Claud Kelp, MD;  Location: [facility] OR;  Service: General;  Laterality: Right;   REPAIR DURAL / CSF LEAK  2021   performed at [facility] in [place]   TOTAL KNEE ARTHROPLASTY  2011   left   TOTAL KNEE ARTHROPLASTY Right 05/10/2014   Procedure: RIGHT TOTAL KNEE ARTHROPLASTY;  Surgeon: Shelda Pal, MD;  Location: [facility] ORS;  Service: Orthopedics;  Laterality: Right;   TOTAL THYROIDECTOMY Bilateral 2022   TUBAL LIGATION     WISDOM TOOTH EXTRACTION  2000    Family History: Family History  Problem Relation Age of Onset   Thyroid disease Mother        hypothyroid   Hyperlipidemia Mother    Depression Mother    Anxiety disorder Mother    Heart attack Father 62   Hypertension Father    Arthritis Father        RA   Coronary artery disease Father    High  Cholesterol Father    Coronary artery disease Brother    Heart disease Brother    Cancer Maternal Grandmother        colon   Heart attack Maternal Grandfather    Alcohol abuse Paternal Grandfather    Cancer Maternal Aunt        colon    Social History  reports that she has never smoked. She has never used smokeless tobacco. She reports that she does not currently use alcohol. She reports that she does not use drugs.  Allergies  Allergen Reactions   Oxycodone Other (See Comments)    Does not feel good. Makes her sleepy.  Medications   Current Facility-Administered Medications:    [START ON 11/27/2023] denosumab (PROLIA) injection 60 mg, 60 mg, Subcutaneous, Q6 months, [name], MD  Current Outpatient Medications:    Accu-Chek Softclix Lancets lancets, Use to check blood glucose up to twice a day (Dx: Type 2 DM with long term insulin use E11.57, Z79.4), Disp: 100 each, Rfl: 5   acetaminophen (TYLENOL) 500 MG tablet, Take 1,000 mg by mouth 2 (two) times daily as needed for moderate pain or headache., Disp: , Rfl:    albuterol (VENTOLIN HFA) 108 (90 Base) MCG/ACT inhaler, Inhale 2 puffs every 4-6 hours as needed - rescue., Disp: 18 g, Rfl: 12   anastrozole (ARIMIDEX) 1 MG tablet, TAKE ONE TABLET DAILY, Disp: 90 tablet, Rfl: 3   aspirin EC 81 MG tablet, Take 81 mg by mouth daily. Swallow whole., Disp: , Rfl:    blood glucose meter kit and supplies, Dispense based on patient and insurance preference. Use up to four times daily as directed. (FOR ICD-10 E10.9, E11.9)., Disp: 1 each, Rfl: 0   budesonide-formoterol (SYMBICORT) 160-4.5 MCG/ACT inhaler, Inhale 2 puffs into the lungs 2 (two) times daily. As needed, Disp: , Rfl:    busPIRone (BUSPAR) 7.5 MG tablet, TAKE ONE TABLET BY MOUTH THREE TIMES DAILY AS NEEDED FOR ANXIETY, Disp: 90 tablet, Rfl: 5   carvedilol (COREG) 25 MG tablet, Take 1 tablet (25 mg total) by mouth 2 (two) times daily with a meal., Disp: 180 tablet, Rfl: 1    cholecalciferol (VITAMIN D3) 25 MCG (1000 UNIT) tablet, Take 2,000 Units by mouth daily., Disp: , Rfl:    Continuous Glucose Sensor (FREESTYLE LIBRE 3 SENSOR) MISC, 1 each by Does not apply route every 14 (fourteen) days. Use to check blood glucose continuously. Change sensor every 14 days., Disp: , Rfl:    diltiazem (CARDIZEM CD) 120 MG 24 hr capsule, Take 1 capsule (120 mg total) by mouth daily., Disp: 90 capsule, Rfl: 0   escitalopram (LEXAPRO) 20 MG tablet, TAKE ONE TABLET EVERY MORNING AND ONE-HALF TABLET EVERY EVENING, Disp: 135 tablet, Rfl: 0   esomeprazole (NEXIUM) 40 MG capsule, Take 1 capsule (40 mg total) by mouth daily., Disp: 90 capsule, Rfl: 0   ezetimibe (ZETIA) 10 MG tablet, Take 1 tablet (10 mg total) by mouth daily., Disp: 90 tablet, Rfl: 0   fenofibrate (TRICOR) 145 MG tablet, Take 1 tablet (145 mg total) by mouth daily., Disp: 90 tablet, Rfl: 1   ferrous sulfate 325 (65 FE) MG EC tablet, Take 325 mg by mouth every other day., Disp: , Rfl:    fluticasone (FLONASE) 50 MCG/ACT nasal spray, Place 2 sprays into both nostrils daily., Disp: 16 g, Rfl: 6   furosemide (LASIX) 20 MG tablet, Take 1 tablet (20 mg total) by mouth daily., Disp: 90 tablet, Rfl: 0   glucose blood (ACCU-CHEK AVIVA) test strip, Twice daily (please use brand that patient insurance covers), Disp: 100 each, Rfl: 5   GNP ULTICARE PEN NEEDLES 32G X 4 MM MISC, AS DIRECTED DAILY, Disp: 100 each, Rfl: 3   insulin degludec (TRESIBA FLEXTOUCH) 200 UNIT/ML FlexTouch Pen, Inject 12-20 Units into the skin daily. (Patient taking differently: Inject 8 Units into the skin daily.), Disp: 9 mL, Rfl: 3   levothyroxine (SYNTHROID) 200 MCG tablet, Take 1 tablet (200 mcg total) by mouth daily before breakfast. Take with a tablet daily to equal daily, Disp: 90 tablet, Rfl: 0   levothyroxine (SYNTHROID) 50 MCG tablet, Take 1  tablet (50 mcg total) by mouth daily before breakfast. Take with tablet daily to equal ,  Disp: 90 tablet, Rfl: 0   loratadine (CLARITIN) 10 MG tablet, Take 1 tablet (10 mg total) by mouth daily., Disp: 30 tablet, Rfl: 11   losartan (COZAAR) 100 MG tablet, Take 1 tablet (100 mg total) by mouth daily., Disp: 90 tablet, Rfl: 0   metFORMIN (GLUCOPHAGE-XR) 500 MG 24 hr tablet, Take 4 tablets (2,000 mg total) by mouth daily with supper., Disp: 360 tablet, Rfl: 0   montelukast (SINGULAIR) 10 MG tablet, Take 1 tablet (10 mg total) by mouth at bedtime., Disp: 90 tablet, Rfl: 1   rosuvastatin (CRESTOR) 40 MG tablet, Take 1 tablet (40 mg total) by mouth daily., Disp: 90 tablet, Rfl: 0   Semaglutide, 1 MG/DOSE, (OZEMPIC, 1 MG/DOSE,) 4 MG/3ML SOPN, Inject 1 mg into the skin once a week., Disp: 9 mL, Rfl: 3   Vitamin D, Ergocalciferol, (DRISDOL) 1.25 MG (50000 UNIT) CAPS capsule, Take 1 capsule (50,000 Units total) by mouth every 7 (seven) days., Disp: 4 capsule, Rfl: 4  Vitals   Vitals:   Sep 20, 2023 2048  Weight: 133.4 kg    Body mass index is 48.94 kg/m.  Physical Exam  General: Awake alert in no distress HNT: Normocephalic atraumatic Lungs: Clear Cardiovascular: Regular at the Abdomen obese, nontender Neurological exam Awake alert oriented x 3 No dysarthria Aphasia Cranials: Pupils equal round react light, extraocular movements intact, visual fields full, face appears grossly symmetric but on smiling maybe there is a subtle left lower facial weakness.  Tongue and palate midline. Motor examination does not reveal any significant drift on bilateral upper and lower extremities. Sensation diminished on the left with somewhat of an extinction on the left side on double simultaneous stimulation. Coordination intact NIH stroke scale 1a Level of Conscious.: 0 1b LOC Questions: 0 1c LOC Commands: 0 2 Best Gaze: 0 3 Visual: 0 4 Facial Palsy: 1 5a Motor Arm - left: 0 5b Motor Arm - Right: 0 6a Motor Leg - Left: 0 6b Motor Leg - Right: 0 7 Limb Ataxia: 0 8 Sensory: 1 9 Best Language:  0 10 Dysarthria: 0 11 Extinct. and Inatten.: 1 TOTAL: 3   Labs/Imaging/Neurodiagnostic studies   CBC:  Recent Labs  Lab 2023-09-20 1504 09-20-23 2046  WBC 7.8 9.4  NEUTROABS 6.0 7.2  HGB 12.6 13.0  14.6  HCT 40.0 42.1  43.0  MCV 86.8 87.7  PLT 213 255   Basic Metabolic Panel:  Lab Results  Component Value Date   NA 137 09-20-2023   NA 136 09-20-2023   K 3.9 09/20/23   K 3.9 September 20, 2023   CO2 23 2023/09/20   GLUCOSE 131 (H) 09/20/2023   GLUCOSE 134 (H) 09-20-23   BUN 16 2023-09-20   BUN 14 2023/09/20   CREATININE 0.70 09-20-2023   CREATININE 0.79 20-Sep-2023   CALCIUM 9.5 09-20-2023   GFRNONAA >60 09-20-2023   GFRAA 89 07/14/2019   Lipid Panel:  Lab Results  Component Value Date   LDLCALC 38 07/17/2023   HgbA1c:  Lab Results  Component Value Date   HGBA1C 7.4 (H) 03/05/2023    Alcohol Level     Component Value Date/Time   ETH <10 2023/09/20 2046   INR  Lab Results  Component Value Date   INR 1.1 20-Sep-2023   APTT  Lab Results  Component Value Date   APTT 29 09-20-23    CT Head without contrast(Personally reviewed): In comparison with  the CT head from 09/08/2023, there is a new abnormal hypodensity in the anterior right temporal lobe-indeterminate but most concerning for an evolving right MCA distribution infarct.  There is superimposed hyperdensity consistent with associated hemorrhage.  Sequel of trauma with hemorrhagic contusion would be the primary differential consideration.  Aspects is a 9.  Underlying atrophy with chronic small vessel ischemic disease  CT angio Head and Neck with contrast(Personally reviewed): Negative for ELVO.  No vascular abnormality seen underlying the right temporal hemorrhage.  There is increased hyperdensity admixed with secretions within the posterior right nasopharynx with dehiscence of the underlying calvarium within this region-this raises possibility for CSF leak at this location-additionally this finding could  also serve as a risk factor for potential CNS infection and also differential consideration for process at the adjacent right temporal lobe.  No hemodynamically significant or correctable stenosis about the major arterial vasculature of the head and neck  MRI Brain(Personally reviewed with neuro rads): IMPRESSION: 1. 1.9 x 1.4 x 3.0 cm (estimated volume 4 mL) intraparenchymal hematoma centered at the anterior right temporal pole. Surrounding edema without significant regional mass effect or midline shift. 2. Hematoma extends through an adjacent calvarial defect at the right middle cranial fossa with extension into the residual right lateral recess of the sphenoid sinus. Findings concerning for CSF leak at this location, and suspected to be the causative etiology of the acute bleed. While there is no evidence for abscess or overt CNS infection by MRI, given the communication with the sinonasal cavity , strong consideration for possible empirical coverage for CNS infection and/or correlation with CSF analysis is suggested. 3. Trace susceptibility artifact with restricted diffusion within the occipital horns of the lateral ventricles, felt to be most consistent with small volume IVH, and likely related to redistribution. No significant enhancement to suggest acute ventriculitis. 4. 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 No acute blood products at this location on prior head CT. 5. Underlying mild chronic microvascular ischemic disease.    ASSESSMENT   [name] is a 73 y.o. female with a very complex past history including that of a CSF leak from her nose for which she follows with [facility] ENT, coming in for evaluation of headache that has been going on for at least 4 days and now has some left-sided weakness. CT head shows a new abnormal hypodensity in the anterior  right temporal lobe-indeterminant but concerning for infarct with a superimposed hyperdensity consistent with associated hemorrhage.  Given prior surgeries on her sinuses, further evaluation with CT angiography head and neck and MRI brain were performed-it seems that the right temporal lobe IPH extends through an adjacent calvarial defect in the right middle cranial fossa with extension into the residual right lateral recess of the sphenoid sinus-Constellation of findings are concerning for a CSF leak at this location and suspected to be the causative etiology of the acute bleed.  While there is no evidence of abscess or overt CNS infection by MRI-given the communication with sinonasal cavity-strong consideration should be for empiric antibiotic coverage although no evidence to suggest ventriculitis even though there is mild susceptibility artifact in the occipital horns consistent with small volume intraventricular hemorrhage likely related to redistribution of the blood.  Given her very complicated ENT history including that of CSF leak and prior surgeries, this hemorrhage is not a primary ICH and is rather related to the sinonasal process.  I would  recommend that she be followed up with her ENT team and she has also been seen by the neurosurgical team at [facility] in conjunction with ENT-which would be best for her since she is a known patient there and the clinical course that she has had is pretty complex and has been protracted.  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  Essentially the ICH is like a contusion but not traumatic but rather secondary to the CSF leak.  RECOMMENDATIONS  Since there is blood around the temporal lobe, I would definitely recommend blood pressure management to ensure that the SBP goal remains between 130-150. Further management of the CSF leak needs to be done by her ENT team and I would recommend reaching out to [facility] for  this. There is no abnormal enhancement on MRI, she is afebrile and has no leukocytosis-I do not strongly feel the need for empiric antibiotic coverage from the CNS infection perspective but I would defer this to her ENT team. Plan was discussed in detail with Dr. Clayborne Dana in the ER.  ______________________________________________________________________    Signed, Milon Dikes, MD [facility]   CRITICAL CARE ATTESTATION Performed by: Milon Dikes, MD Total critical care time: 90 minutes Critical care time was exclusive of separately billable procedures and treating other patients and/or supervising APPs/Residents/Students Critical care was necessary to treat or prevent imminent or life-threatening deterioration. This patient is critically ill and at significant risk for neurological worsening and/or death and care requires constant monitoring. Critical care was time spent personally by me on the following activities: development of treatment plan with patient and/or surrogate as well as nursing, discussions with consultants, evaluation of patient's response to treatment, examination of patient, obtaining history from patient or surrogate, ordering and performing treatments and interventions, ordering and review of laboratory studies, ordering and review of radiographic studies, pulse oximetry, re-evaluation of patient's condition, participation in multidisciplinary rounds and medical decision making of high complexity in the care of this patient.

## 2023-09-11 NOTE — ED Provider Notes (Signed)
 [place] EMERGENCY DEPARTMENT AT [facility] Provider Note   CSN: [id number] Arrival date & time: 09/11/23  2037     History  Chief Complaint  Patient presents with   Code Stroke    [name] is a 73 y.o. female.  73 year old female comes to the emergency room today after she had a period of difficulty with her speech, left-sided facial droop and left arm weakness.  Symptoms began at 1815 this evening.  Patient not on thinners.  Patient was activated as a code stroke in the field.        Home Medications Prior to Admission medications   Medication Sig Start Date End Date Taking? Authorizing Provider  Accu-Chek Softclix Lancets lancets Use to check blood glucose up to twice a day (Dx: Type 2 DM with long term insulin use E11.57, Z79.4) 07/10/22   [name], MD  acetaminophen (TYLENOL) 500 MG tablet Take 1,000 mg by mouth 2 (two) times daily as needed for moderate pain or headache.    [provider]  albuterol (VENTOLIN HFA) 108 (90 Base) MCG/ACT inhaler Inhale 2 puffs every 4-6 hours as needed - rescue. 06/19/20   Waymon Budge, MD  anastrozole (ARIMIDEX) 1 MG tablet TAKE ONE TABLET DAILY 07/02/23   Serena Croissant, MD  aspirin EC 81 MG tablet Take 81 mg by mouth daily. Swallow whole.    [provider]  blood glucose meter kit and supplies Dispense based on patient and insurance preference. Use up to four times daily as directed. (FOR ICD-10 E10.9, E11.9). 08/08/21   [name], MD  budesonide-formoterol [facility]) 160-4.5 MCG/ACT inhaler Inhale 2 puffs into the lungs 2 (two) times daily. As needed    [provider]  busPIRone (BUSPAR) 7.5 MG tablet TAKE ONE TABLET BY MOUTH THREE TIMES DAILY AS NEEDED FOR ANXIETY 10/07/22   [name], MD  carvedilol (COREG) 25 MG tablet Take 1 tablet (25 mg total) by mouth 2 (two) times daily with a meal. 04/10/23   [name], MD  cholecalciferol (VITAMIN D3) 25 MCG (1000 UNIT)  tablet Take 2,000 Units by mouth daily.    [provider]  Continuous Glucose Sensor (FREESTYLE LIBRE 3 SENSOR) MISC 1 each by Does not apply route every 14 (fourteen) days. Use to check blood glucose continuously. Change sensor every 14 days.    [provider]  diltiazem (CARDIZEM CD) 120 MG 24 hr capsule Take 1 capsule (120 mg total) by mouth daily. 08/28/23   [name], MD  escitalopram (LEXAPRO) 20 MG tablet TAKE ONE TABLET EVERY MORNING AND ONE-HALF TABLET EVERY EVENING 08/28/23   [name], MD  esomeprazole (NEXIUM) 40 MG capsule Take 1 capsule (40 mg total) by mouth daily. 07/01/23   [name], MD  ezetimibe (ZETIA) 10 MG tablet Take 1 tablet (10 mg total) by mouth daily. 07/01/23   [name], MD  fenofibrate (TRICOR) 145 MG tablet Take 1 tablet (145 mg total) by mouth daily. 04/10/23   [name], MD  ferrous sulfate 325 (65 FE) MG EC tablet Take 325 mg by mouth every other day.    [provider]  fluticasone (FLONASE) 50 MCG/ACT nasal spray Place 2 sprays into both nostrils daily. 12/22/17   [name], MD  furosemide (LASIX) 20 MG tablet Take 1 tablet (20 mg total) by mouth daily. 08/28/23   [name], MD  glucose blood (ACCU-CHEK AVIVA) test strip  Twice daily (please use brand that patient insurance covers) 07/08/22   [name], MD  GNP ULTICARE PEN NEEDLES 32G X 4 MM MISC AS DIRECTED DAILY 02/17/23   Carlus Pavlov, MD  insulin degludec (TRESIBA FLEXTOUCH) 200 UNIT/ML FlexTouch Pen Inject 12-20 Units into the skin daily. Patient taking differently: Inject 8 Units into the skin daily. 02/14/22   Carlus Pavlov, MD  levothyroxine (SYNTHROID) 200 MCG tablet Take 1 tablet (200 mcg total) by mouth daily before breakfast. Take with a tablet daily to equal daily 07/01/23   [name], MD  levothyroxine (SYNTHROID) 50 MCG tablet Take 1 tablet (50 mcg total) by mouth daily before breakfast. Take with tablet  daily to equal 07/01/23   [name], MD  loratadine (CLARITIN) 10 MG tablet Take 1 tablet (10 mg total) by mouth daily. 12/22/17   [name], MD  losartan (COZAAR) 100 MG tablet Take 1 tablet (100 mg total) by mouth daily. 08/28/23   [name], MD  metFORMIN (GLUCOPHAGE-XR) 500 MG 24 hr tablet Take 4 tablets (2,000 mg total) by mouth daily with supper. 05/21/23   [name], MD  montelukast (SINGULAIR) 10 MG tablet Take 1 tablet (10 mg total) by mouth at bedtime. 03/28/23   [name], MD  rosuvastatin (CRESTOR) 40 MG tablet Take 1 tablet (40 mg total) by mouth daily. 07/01/23   [name], MD  Semaglutide, 1 MG/DOSE, (OZEMPIC, 1 MG/DOSE,) 4 MG/3ML SOPN Inject 1 mg into the skin once a week. 09/03/21   Carlus Pavlov, MD  Vitamin D, Ergocalciferol, (DRISDOL) 1.25 MG (50000 UNIT) CAPS capsule Take 1 capsule (50,000 Units total) by mouth every 7 (seven) days. 10/25/22   [name], MD      Allergies    Oxycodone    Review of Systems   Review of Systems  Physical Exam Updated Vital Signs BP (!) 161/77   Pulse 66   Resp 20   Wt 133.4 kg   SpO2 95%   BMI 48.94 kg/m  Physical Exam Vitals reviewed.  Neurological:     Mental Status: She is alert.     Comments: Mild left-sided facial droop.  Cranial nerves otherwise intact.  Grip strength in left hand 3 out of 5 compared to 5 out of 5 on the right.  Patient with no lower extremity weakness or numbness.     ED Results / Procedures / Treatments   Labs (all labs ordered are listed, but only abnormal results are displayed) Labs Reviewed  CBC - Abnormal; Notable for the following components:      Result Value   RDW 15.6 (*)    All other components within normal limits  COMPREHENSIVE METABOLIC PANEL - Abnormal; Notable for the following components:   Glucose, Bld 134 (*)    AST 48 (*)    All other components within normal limits  I-STAT CHEM 8, ED - Abnormal; Notable for the following components:    Glucose, Bld 131 (*)    Calcium, Ion 1.13 (*)    All other components within normal limits  CBG MONITORING, ED - Abnormal; Notable for the following components:   Glucose-Capillary 137 (*)    All other components within normal limits  ETHANOL  PROTIME-INR  APTT  DIFFERENTIAL  RAPID URINE DRUG SCREEN, HOSP PERFORMED  URINALYSIS, ROUTINE W REFLEX MICROSCOPIC    EKG None  Radiology CT ANGIO HEAD NECK W WO CM (CODE STROKE) Result  Date: 09/11/2023 CLINICAL DATA:  Initial evaluation for acute neuro deficit, stroke suspected. EXAM: CT ANGIOGRAPHY HEAD AND NECK WITH AND WITHOUT CONTRAST TECHNIQUE: Multidetector CT imaging of the head and neck was performed using the standard protocol during bolus administration of intravenous contrast. Multiplanar CT image reconstructions and MIPs were obtained to evaluate the vascular anatomy. Carotid stenosis measurements (when applicable) are obtained utilizing NASCET criteria, using the distal internal carotid diameter as the denominator. RADIATION DOSE REDUCTION: This exam was performed according to the departmental dose-optimization program which includes automated exposure control, adjustment of the mA and/or kV according to patient size and/or use of iterative reconstruction technique. CONTRAST:  75mL OMNIPAQUE IOHEXOL 350 MG/ML SOLN COMPARISON:  Head CT from earlier the same day as well as recent CTA from 09/08/2023 FINDINGS: CTA NECK FINDINGS Aortic arch: Visualized aortic arch within normal limits for caliber. Mild aortic atherosclerosis. Bovine branching pattern noted. No stenosis about the origin the great vessels. Right carotid system: Right common and internal carotid arteries are patent without stenosis or dissection. Left carotid system: Left common and internal carotid arteries are patent without stenosis or dissection. Vertebral arteries: Both vertebral arteries are arthrosis favored arteries. No proximal subclavian artery stenosis. Left vertebral  artery dominant. Proximal right vertebral artery not well assessed due to adjacent venous contamination. Visualized vertebral arteries are patent without stenosis or dissection. Skeleton: No discrete or worrisome osseous lesions. Moderate spondylosis present at C5-6 and C6-7. Other neck: No other acute abnormality within the neck. Sequelae of prior thyroidectomy. Upper chest: No other acute finding. Review of the MIP images confirms the above findings CTA HEAD FINDINGS Anterior circulation: Both internal carotid arteries are patent to the termini without stenosis. Right A1 segment patent. Left A1 segment hypoplastic and/or absent. Normal anterior communicating artery complex. Both ACAs patent without significant stenosis. No M1 stenosis or occlusion. No proximal MCA branch occlusion. Distal MCA branches perfused and fairly symmetric. No vascular abnormality seen underlying the right temporal hemorrhage. Small lipoma again noted. Posterior circulation: Both V4 segments patent without stenosis. Left vertebral artery dominant. Both PICA patent at their origins. Basilar patent without stenosis. Superior cerebral arteries patent bilaterally. Right PCA supplied via the basilar. Fetal type origin of the left PCA. PCAs patent to their distal aspects without stenosis. Venous sinuses: Patent allowing for timing the contrast bolus. Anatomic variants: As above. No aneurysm or other vascular malformation. Mild surrounding reactive enhancement noted about the right temporal bleed. Otherwise, no other significant abnormal intracranial enhancement seen on delayed view. Following contrast administration, there is increased hyperdensity admixed with secretions within the posterior right nasopharynx (series 13, image 9). There is dehiscence of the calvarium within this region (series 4, images 33, 31 on prior noncontrast head CT. Finding raises the possibility for CSF leak at this location. Finding also could serve as a risk factor  for potential CNS infection, also a differential consideration for the process at the adjacent right temporal lobe. Review of the MIP images confirms the above findings IMPRESSION: 1. Negative CTA for large vessel occlusion or other emergent finding. No vascular abnormality seen underlying the right temporal hemorrhage. 2. Increased hyperdensity admixed with secretions within the posterior right nasopharynx, with dehiscence of the underlying calvarium within this region. Finding raises the possibility for CSF leak at this location. Additionally, this finding could also serve as a risk factor for potential CNS infection, also a differential consideration for the process at the adjacent right temporal lobe. 3. No hemodynamically significant or correctable stenosis about the  major arterial vasculature of the head and neck. 4.  Aortic Atherosclerosis (ICD10-I70.0). These results were communicated to Dr. Wilford Corner at 9:46 pm on 09/11/2023 by text page via the [facility] messaging system. Electronically Signed   By: Rise Mu M.D.   On: 09/11/2023 21:59   CT HEAD CODE STROKE WO CONTRAST Result Date: 09/11/2023 CLINICAL DATA:  Code stroke. EXAM: CT HEAD WITHOUT CONTRAST TECHNIQUE: Contiguous axial images were obtained from the base of the skull through the vertex without intravenous contrast. RADIATION DOSE REDUCTION: This exam was performed according to the departmental dose-optimization program which includes automated exposure control, adjustment of the mA and/or kV according to patient size and/or use of iterative reconstruction technique. COMPARISON:  Prior study from 09/08/2023 FINDINGS: Brain: Mild age-related cerebral atrophy with chronic small vessel ischemic disease. There is new abnormal hypodensity involving the anterior right temporal lobe (series 3, image 16). Small amount of adjacent hyperdensity present at this location, concerning for hemorrhage (series 3, image 16). Unclear whether this finding  reflects a hemorrhagic contusion or possibly hemorrhagic stroke. Superimposed small lipoma again noted. No other acute intracranial hemorrhage. No other acute large vessel territory infarct. No other mass lesion or midline shift. No hydrocephalus or extra-axial fluid collection. Vascular: No appreciable abnormal hyperdense vessel. Skull: Scalp soft tissues demonstrate no acute finding. Sinuses/Orbits: Globes orbital soft tissues demonstrate no acute finding. Sequelae of prior endoscopic sinonasal surgery noted with residual chronic mucosal thickening. No significant mastoid effusion. Other: None. ASPECTS (Alberta Stroke Program Early CT Score) - Ganglionic level infarction (caudate, lentiform nuclei, internal capsule, insula, M1-M3 cortex): 6 - Supraganglionic infarction (M4-M6 cortex): 3 Total score (0-10 with 10 being normal): 9 IMPRESSION: 1. New abnormal hypodensity involving the anterior right temporal lobe, indeterminate, but most concerning for an evolving right MCA distribution infarct. Superimposed hyperdensity consistent with associated hemorrhage. Sequelae of trauma with hemorrhagic contusion would be the primary differential consideration. 2. Aspects is 9. 3. Underlying atrophy with chronic small vessel ischemic disease. These results were communicated to Dr. Wilford Corner at 9:02 pm on 09/11/2023 by text page via the [facility] messaging system. Electronically Signed   By: Rise Mu M.D.   On: 09/11/2023 21:05    Procedures .Critical Care  Performed by: Arletha Pili, DO Authorized by: Arletha Pili, DO   Critical care provider statement:    Critical care time (minutes):  30   Critical care was necessary to treat or prevent imminent or life-threatening deterioration of the following conditions:  CNS failure or compromise   Critical care was time spent personally by me on the following activities:  Development of treatment plan with patient or surrogate, discussions with consultants,  evaluation of patient's response to treatment, examination of patient, ordering and review of laboratory studies, ordering and review of radiographic studies, ordering and performing treatments and interventions, pulse oximetry, re-evaluation of patient's condition and review of old charts     Medications Ordered in ED Medications  iohexol (OMNIPAQUE) 350 MG/ML injection 75 mL (75 mLs Intravenous Contrast Given 09/11/23 2129)  gadobutrol (GADAVIST) 1 MMOL/ML injection 10 mL (10 mLs Intravenous Contrast Given 09/11/23 2202)    ED Course/ Medical Decision Making/ A&P                                 Medical Decision Making 73 year old female here today with left-sided facial droop, left-sided weakness.  Plan-patient with strokelike symptoms, proceeded with code stroke activation.  CT imaging showed questionable mass, possible small hemorrhage on CT scan.  Images were discussed with Dr. Wilford Corner who did not believe patient was a lytic candidate, proceeded with MRI to further characterize symptoms.  Patient has had a headache over the last few weeks.  She has had some URI symptoms, diarrhea.  On my assessment, do not appreciate any meningismus on the patient.  She is afebrile, has a normal white count.  Currently getting MRI.  Reassessment 11 PM-signed out to Dr. Clayborne Dana pending MRI, admission.  Amount and/or Complexity of Data Reviewed Labs: ordered. Radiology: ordered.  Risk Prescription drug management.           Final Clinical Impression(s) / ED Diagnoses Final diagnoses:  Cerebrovascular accident (CVA), unspecified mechanism [facility])    Rx / DC Orders ED Discharge Orders     None         Arletha Pili, DO 09/11/23 2304

## 2023-09-11 NOTE — Telephone Encounter (Addendum)
 Chief Complaint: headache, diarrhea Symptoms: headache, diarrhea, dizziness, weakness, neck pain Frequency: headache since 3/17 Pertinent Negatives: Patient denies fever, vomiting, CP, SOB Disposition: [x] ED /[] Urgent Care (no appt availability in office) / [] Appointment(In office/virtual)/ []  [place] [facility]/ [] Home Care/ [x] Refused Recommended Disposition /[]  Mobile Bus/ []  Follow-up with PCP Additional Notes: Pt reports headache and diarrhea. Hx of two CSF leaks. Pt went to [facility] on 3/17 and [facility] on 3/18. CSF leak was ruled out. Pt developed diarrhea on Friday 3/14 but states that was not addressed during those ED visits since the main concern was her headache and doctors said her lab work looked "normal." Pt endorses some nausea and some abd pain when she is nauseous. Pt states her headache feels better now than it did on 3/17 when it started. Pt reports neck pain and states she can't touch her chin to her chest, but she also states that was the case at her ED visits this week. Denies fever. Pt reports 3 episodes of diarrhea today, says it is getting better but still present. Endorses episodes of incontinence. States the diarrhea is "phlegm-y" and orange. Pt states she recently took 4 different antibiotics for an injury to the top of her foot. Pt reports when her diarrhea was most severe, she was not drinking much. Pt trying to sip on Gatorade but endorses dry mouth, states her lips are peeling, and endorses dizziness, weakness, and gait instability. Endorses dark urine. States she uses a walker.   Due to pt's hx of CSF leak and frequent diarrhea with concerns of dehydration and poss C. Diff d/t antibiotics, RN advised pt needs to go to the ED. Pt refused, states she can't do that as she has been twice already and the thought of going back is exhausting. RN educated pt on why the ED is the safest and best place to address her symptoms, pt states she will think about it and have  one of her daughters bring her if she decides to go. RN advised pt if she has severe worsening of the headache, N/V, worsening weakness, CP, SOB, she needs to call 911. Pt verbalized understanding.   Copied from CRM [id number]. Topic: Clinical - Red Word Triage >> Sep 11, 2023 12:29 PM Fonda Kinder J wrote: Red Word that prompted transfer to Nurse Triage: Severe headache,diarrhea Reason for Disposition  Patient sounds very sick or weak to the triager  Answer Assessment - Initial Assessment Questions 1. DIARRHEA SEVERITY: "How bad is the diarrhea?" "How many more stools have you had in the past 24 hours than normal?"    - NO DIARRHEA (SCALE 0)   - MILD (SCALE 1-3): Few loose or mushy BMs; increase of 1-3 stools over normal daily number of stools; mild increase in ostomy output.   -  MODERATE (SCALE 4-7): Increase of 4-6 stools daily over normal; moderate increase in ostomy output.   -  SEVERE (SCALE 8-10; OR "WORST POSSIBLE"): Increase of 7 or more stools daily over normal; moderate increase in ostomy output; incontinence.     Diarrhea since Friday - 3 episodes so far today. "Not as bad as it was, before I  would have diarrhea when I urinated", states diarrhea is phlegm-y and orange. States she had diarrhea at [facility] and [facility] but doctors were more concerned with CSF leak. She states her labs looked good. 2. ONSET: "When did the diarrhea begin?"      Last Friday 3. BM CONSISTENCY: "How loose or watery is the diarrhea?"  Phlegm-y and orange 4. VOMITING: "Are you also vomiting?" If Yes, ask: "How many times in the past 24 hours?"      No 5. ABDOMEN PAIN: "Are you having any abdomen pain?" If Yes, ask: "What does it feel like?" (e.g., crampy, dull, intermittent, constant)      "Sore all over - laying in those emergency rooms kills ya" "if I lay flat it kills my back, if they raise you up your butt goes numb", no abd pain other than with nausea 6. ABDOMEN PAIN SEVERITY: If present, ask: "How bad is  the pain?"  (e.g., Scale 1-10; mild, moderate, or severe)   - MILD (1-3): doesn't interfere with normal activities, abdomen soft and not tender to touch    - MODERATE (4-7): interferes with normal activities or awakens from sleep, abdomen tender to touch    - SEVERE (8-10): excruciating pain, doubled over, unable to do any normal activities       No 7. ORAL INTAKE: If vomiting, "Have you been able to drink liquids?" "How much liquids have you had in the past 24 hours?"     No vomiting  8. HYDRATION: "Any signs of dehydration?" (e.g., dry mouth [not just dry lips], too weak to stand, dizziness, new weight loss) "When did you last urinate?"     "My mouth is so dry, I asked for ice" "my lips have peeled off" "been trying to sip on some Gatorade to keep my electrolytes", endorses dizziness and weakness with standing - "I live in a small apt and hold as I go, I use a can, doing pretty good", endorses unsteadiness 9. EXPOSURE: "Have you traveled to a foreign country recently?" "Have you been exposed to anyone with diarrhea?" "Could you have eaten any food that was spoiled?"     N/A 10. ANTIBIOTIC USE: "Are you taking antibiotics now or have you taken antibiotics in the past 2 months?"       "Yes, taking a lot of antibiotics" "I injured the top of my foot and took 4 rounds of antibiotics" "off of those for a couple weeks" "stool smelled worse with antibiotics" 11. OTHER SYMPTOMS: "Do you have any other symptoms?" (e.g., fever, blood in stool)       Headache -- "tension headache, the back of my neck really hurts and I can't bend my head to my chin (unable to touch chin to chest but states she has had that problem since Monday 3/17 - back of neck hurts worse), I thought it had to do with CSF leak and I have had that twice in 2021 and in 2023" - seen in the ED 3/18 at [facility] and 3/17 at [facility]. Denies fever. Looking for an appt with Dario Guardian who has treated her for CSF leaks. Saw him on 2/20 and everything  was OK. Pt states headache today is not as bad as it was 3/18 when she went to [facility] - said on that day she couldn't even hold her head up. Wants Phenergan - said she is "sometimes" nauseous. Hx of meningitis with the first CSF leak. Dark urine.  Protocols used: [facility]

## 2023-09-12 DIAGNOSIS — I619 Nontraumatic intracerebral hemorrhage, unspecified: Secondary | ICD-10-CM | POA: Diagnosis not present

## 2023-09-12 DIAGNOSIS — I5032 Chronic diastolic (congestive) heart failure: Secondary | ICD-10-CM | POA: Diagnosis not present

## 2023-09-12 DIAGNOSIS — I110 Hypertensive heart disease with heart failure: Secondary | ICD-10-CM | POA: Diagnosis not present

## 2023-09-12 DIAGNOSIS — E119 Type 2 diabetes mellitus without complications: Secondary | ICD-10-CM | POA: Diagnosis not present

## 2023-09-12 DIAGNOSIS — G935 Compression of brain: Secondary | ICD-10-CM | POA: Diagnosis not present

## 2023-09-12 DIAGNOSIS — R519 Headache, unspecified: Secondary | ICD-10-CM | POA: Diagnosis not present

## 2023-09-12 DIAGNOSIS — Z79811 Long term (current) use of aromatase inhibitors: Secondary | ICD-10-CM | POA: Diagnosis not present

## 2023-09-12 DIAGNOSIS — R197 Diarrhea, unspecified: Secondary | ICD-10-CM | POA: Diagnosis not present

## 2023-09-12 DIAGNOSIS — R791 Abnormal coagulation profile: Secondary | ICD-10-CM | POA: Diagnosis not present

## 2023-09-12 DIAGNOSIS — I10 Essential (primary) hypertension: Secondary | ICD-10-CM | POA: Diagnosis not present

## 2023-09-12 DIAGNOSIS — I611 Nontraumatic intracerebral hemorrhage in hemisphere, cortical: Secondary | ICD-10-CM | POA: Diagnosis not present

## 2023-09-12 DIAGNOSIS — Z7951 Long term (current) use of inhaled steroids: Secondary | ICD-10-CM | POA: Diagnosis not present

## 2023-09-12 DIAGNOSIS — R531 Weakness: Secondary | ICD-10-CM | POA: Diagnosis not present

## 2023-09-12 DIAGNOSIS — Z7985 Long-term (current) use of injectable non-insulin antidiabetic drugs: Secondary | ICD-10-CM | POA: Diagnosis not present

## 2023-09-12 DIAGNOSIS — I2510 Atherosclerotic heart disease of native coronary artery without angina pectoris: Secondary | ICD-10-CM | POA: Diagnosis not present

## 2023-09-12 DIAGNOSIS — G936 Cerebral edema: Secondary | ICD-10-CM | POA: Diagnosis not present

## 2023-09-12 DIAGNOSIS — Z79899 Other long term (current) drug therapy: Secondary | ICD-10-CM | POA: Diagnosis not present

## 2023-09-12 DIAGNOSIS — J4489 Other specified chronic obstructive pulmonary disease: Secondary | ICD-10-CM | POA: Diagnosis not present

## 2023-09-12 DIAGNOSIS — Z7984 Long term (current) use of oral hypoglycemic drugs: Secondary | ICD-10-CM | POA: Diagnosis not present

## 2023-09-12 DIAGNOSIS — R569 Unspecified convulsions: Secondary | ICD-10-CM | POA: Diagnosis not present

## 2023-09-12 DIAGNOSIS — J4520 Mild intermittent asthma, uncomplicated: Secondary | ICD-10-CM | POA: Diagnosis not present

## 2023-09-12 DIAGNOSIS — K219 Gastro-esophageal reflux disease without esophagitis: Secondary | ICD-10-CM | POA: Diagnosis not present

## 2023-09-12 DIAGNOSIS — Z7982 Long term (current) use of aspirin: Secondary | ICD-10-CM | POA: Diagnosis not present

## 2023-09-12 DIAGNOSIS — Z794 Long term (current) use of insulin: Secondary | ICD-10-CM | POA: Diagnosis not present

## 2023-09-12 DIAGNOSIS — R479 Unspecified speech disturbances: Secondary | ICD-10-CM | POA: Diagnosis present

## 2023-09-12 DIAGNOSIS — I610 Nontraumatic intracerebral hemorrhage in hemisphere, subcortical: Secondary | ICD-10-CM | POA: Diagnosis not present

## 2023-09-12 DIAGNOSIS — Z1152 Encounter for screening for COVID-19: Secondary | ICD-10-CM | POA: Diagnosis not present

## 2023-09-12 DIAGNOSIS — R29810 Facial weakness: Secondary | ICD-10-CM | POA: Diagnosis not present

## 2023-09-12 DIAGNOSIS — I69354 Hemiplegia and hemiparesis following cerebral infarction affecting left non-dominant side: Secondary | ICD-10-CM | POA: Diagnosis not present

## 2023-09-12 DIAGNOSIS — I69322 Dysarthria following cerebral infarction: Secondary | ICD-10-CM | POA: Diagnosis not present

## 2023-09-12 DIAGNOSIS — G9600 Cerebrospinal fluid leak, unspecified: Secondary | ICD-10-CM | POA: Diagnosis not present

## 2023-09-12 MED ORDER — FENTANYL CITRATE PF 50 MCG/ML IJ SOSY
100.0000 ug | PREFILLED_SYRINGE | Freq: Once | INTRAMUSCULAR | Status: AC
  Administered 2023-09-12: 100 ug via INTRAVENOUS
  Filled 2023-09-12: qty 2

## 2023-09-12 NOTE — ED Notes (Signed)
 [facility] CALLED FOR PT PICK IP

## 2023-09-12 NOTE — ED Notes (Signed)
 Called report for ED to ED transfer from [facility] to [facility] ED. Accepting physician at [facility] is MD Laurell Josephs. Report called to [facility]. @ [phone]

## 2023-09-12 NOTE — ED Notes (Signed)
 Swallow screen held to maintain NPO status, MD aware.

## 2023-09-12 NOTE — ED Notes (Signed)
 Report given to Paramedic kelly with transport crew.

## 2023-09-12 NOTE — ED Notes (Signed)
 Called [facility] at 12:25 AM for ENT consult per EDP

## 2023-09-14 NOTE — Assessment & Plan Note (Signed)
 Well controlled, no changes to meds. Encouraged heart healthy diet such as the DASH diet and exercise as tolerated.

## 2023-09-14 NOTE — Assessment & Plan Note (Signed)
 Tolerating statin, encouraged heart healthy diet, avoid trans fats, minimize simple carbs and saturated fats. Increase exercise as tolerated

## 2023-09-14 NOTE — Assessment & Plan Note (Signed)
 On Levothyroxine, continue to monitor

## 2023-09-14 NOTE — Assessment & Plan Note (Signed)
 Encouraged DASH or MIND diet, decrease po intake and increase exercise as tolerated. Needs 7-8 hours of sleep nightly. Avoid trans fats, eat small, frequent meals every 4-5 hours with lean proteins, complex carbs and healthy fats. Minimize simple carbs, high fat foods and processed foods

## 2023-09-14 NOTE — Assessment & Plan Note (Signed)
hgba1c unacceptable, minimize simple carbs. Increase exercise as tolerated. Continue current meds

## 2023-09-14 NOTE — Assessment & Plan Note (Signed)
 No recent exacerbation

## 2023-09-14 NOTE — Assessment & Plan Note (Signed)
 Supplement and monitor

## 2023-09-14 NOTE — Assessment & Plan Note (Signed)
 Rate controlled

## 2023-09-15 ENCOUNTER — Telehealth: Payer: Self-pay

## 2023-09-15 NOTE — Transitions of Care (Post Inpatient/ED Visit) (Unsigned)
   09/15/2023  Name: [name] MRN: [id number] DOB: [birth year]  Today's TOC FU Call Status: Today's TOC FU Call Status:: Unsuccessful Call (2nd Attempt) Unsuccessful Call (1st Attempt) Date: 09/10/23 Unsuccessful Call (2nd Attempt) Date: 09/15/23  Attempted to reach the patient regarding the most recent Inpatient/ED visit.  Follow Up Plan: Additional outreach attempts will be made to reach the patient to complete the Transitions of Care (Post Inpatient/ED visit) call.   Signature Karena Addison, LPN [facility] Nurse Health Advisor Direct Dial [phone]

## 2023-09-15 NOTE — Transitions of Care (Post Inpatient/ED Visit) (Signed)
   09/15/2023  Name: [name] MRN: [id number] DOB: [birth year]  Today's TOC FU Call Status: Today's TOC FU Call Status:: Unsuccessful Call (1st Attempt) Unsuccessful Call (1st Attempt) Date: 09/15/23  Attempted to reach the patient regarding the most recent Inpatient/ED visit.  Follow Up Plan: Additional outreach attempts will be made to reach the patient to complete the Transitions of Care (Post Inpatient/ED visit) call.   Hilbert Odor RN, CCM [place]  VBCI-Population Health RN Care Manager [phone]

## 2023-09-16 ENCOUNTER — Telehealth: Payer: Self-pay | Admitting: *Deleted

## 2023-09-16 NOTE — Transitions of Care (Post Inpatient/ED Visit) (Signed)
   09/16/2023  Name: [name] MRN: [id number] DOB: 1951-05-20  Today's TOC FU Call Status: Today's TOC FU Call Status:: Unsuccessful Call (3rd Attempt) Unsuccessful Call (1st Attempt) Date: 09/10/23 Unsuccessful Call (2nd Attempt) Date: 09/15/23 Unsuccessful Call (3rd Attempt) Date: 09/16/23  Attempted to reach the patient regarding the most recent Inpatient/ED visit.  Follow Up Plan: No further outreach attempts will be made at this time. We have been unable to contact the patient.  Signature Karena Addison, LPN [facility] Nurse Health Advisor Direct Dial [phone]

## 2023-09-16 NOTE — Transitions of Care (Post Inpatient/ED Visit) (Signed)
   09/16/2023  Name: [name] MRN: [id number] DOB: February 17, 1951  Today's TOC FU Call Status: Today's TOC FU Call Status:: Unsuccessful Call (2nd Attempt) Unsuccessful Call (2nd Attempt) Date: 09/16/23  Attempted to reach the patient regarding the most recent Inpatient visit; left HIPAA compliant voice message requesting call back  Follow Up Plan: Additional outreach attempts will be made to reach the patient to complete the Transitions of Care (Post Inpatient visit) call.   Pls call/ message for questions,  Caryl Pina, RN, BSN, CCRN Alumnus RN Care Manager  Transitions of Care  VBCI - [facility] [phone]: direct office

## 2023-09-17 ENCOUNTER — Telehealth: Payer: Self-pay | Admitting: *Deleted

## 2023-09-17 NOTE — Transitions of Care (Post Inpatient/ED Visit) (Signed)
   09/17/2023  Name: [name] MRN: [id number] DOB: 05-01-1951  Today's TOC FU Call Status: Today's TOC FU Call Status:: Unsuccessful Call (3rd Attempt) Unsuccessful Call (3rd Attempt) Date: 09/17/23  Attempted to reach the patient regarding the most recent Inpatient visit; left HIPAA compliant voice message requesting call back  Follow Up Plan: No further outreach attempts will be made at this time. We have been unable to contact the patient.  Pls call/ message for questions,  Caryl Pina, RN, BSN, CCRN Alumnus RN Care Manager  Transitions of Care  VBCI - [facility] [phone]: direct office

## 2023-09-18 ENCOUNTER — Telehealth (INDEPENDENT_AMBULATORY_CARE_PROVIDER_SITE_OTHER): Admitting: Family Medicine

## 2023-09-18 ENCOUNTER — Telehealth: Payer: Self-pay

## 2023-09-18 DIAGNOSIS — E7800 Pure hypercholesterolemia, unspecified: Secondary | ICD-10-CM | POA: Diagnosis not present

## 2023-09-18 DIAGNOSIS — E66812 Obesity, class 2: Secondary | ICD-10-CM | POA: Diagnosis not present

## 2023-09-18 DIAGNOSIS — I5032 Chronic diastolic (congestive) heart failure: Secondary | ICD-10-CM

## 2023-09-18 DIAGNOSIS — E890 Postprocedural hypothyroidism: Secondary | ICD-10-CM | POA: Diagnosis not present

## 2023-09-18 DIAGNOSIS — E559 Vitamin D deficiency, unspecified: Secondary | ICD-10-CM | POA: Diagnosis not present

## 2023-09-18 DIAGNOSIS — I4719 Other supraventricular tachycardia: Secondary | ICD-10-CM

## 2023-09-18 DIAGNOSIS — E1165 Type 2 diabetes mellitus with hyperglycemia: Secondary | ICD-10-CM

## 2023-09-18 DIAGNOSIS — I619 Nontraumatic intracerebral hemorrhage, unspecified: Secondary | ICD-10-CM

## 2023-09-18 DIAGNOSIS — I10 Essential (primary) hypertension: Secondary | ICD-10-CM

## 2023-09-18 MED ORDER — DILTIAZEM HCL ER COATED BEADS 180 MG PO CP24: 180.0000 mg | ORAL_CAPSULE | Freq: Every day | ORAL | 2 refills | Status: DC

## 2023-09-18 MED ORDER — BUTALBITAL-ACETAMINOPHEN 50-300 MG PO TABS: 1.0000 | ORAL_TABLET | Freq: Two times a day (BID) | ORAL | 1 refills | Status: DC | PRN

## 2023-09-18 NOTE — Telephone Encounter (Signed)
 Copied from CRM [id number]. Topic: Clinical - Prescription Issue >> Sep 18, 2023  1:44 PM Fonda Kinder J wrote: Reason for CRM:  [facility] pharmacy stated that Butalbital-Acetaminophen 50-300 MG TABS is not covered by the pts insurance and they wanted to know if her provider was interested in other options the pharmacist had available Callback # Josh [phone]

## 2023-09-20 NOTE — Progress Notes (Signed)
 Subjective:    [name], female    DOB: 1950-11-09, 73 y.o.   MRN: [id number]  No chief complaint on file.   HPI Discussed the use of AI scribe software for clinical note transcription with the patient, who gave verbal consent to proceed.  History of Present Illness [name] is a 73 year old female with a recent hemorrhagic stroke who presents for follow-up after hospitalization. She is currently staying with a caregiver after her daughter returned to [place].  She experienced a hemorrhagic stroke, which led to a seizure, and was initially taken to [facility] before being transferred to [facility]. Her daughter noticed facial drooping and called 911. During her hospital stay, an MRI revealed bleeding in the right temporal lobe, causing increased intracranial pressure, headache, confusion, and seizures. A small lipoma was also noted in the same region. She did not experience any falls or trauma prior to the event. Since discharge, she has persistent headaches but no residual weakness, confusion, or other neurological deficits.  She has a history of hypertension, managed with losartan. Her blood pressure was significantly elevated during the hospital admission, reaching 190/110 mmHg, but has since improved. At home, her blood pressure readings have been higher than usual, often in the 160s, and occasionally reaching 170s to 180s. Her heart rate has remained within normal limits, between 60 to 90 bpm.  She has diabetes, managed with semaglutide (Ozempic). The last A1c was checked in September, and a follow-up is needed to assess current control. She has not had a recent urine albumin creatinine ratio (UACR) test, which is used to monitor kidney function, especially in the context of diabetes. Previous tests showed no proteinuria, but a new test is needed to assess her current status. She has been on losartan, which helps protect kidney function, and semaglutide,  which may also offer renal protection.    Past Medical History:  Diagnosis Date   Acquired dilation of ascending aorta and aortic root (HCC)    Ascending arctic 43 mm and aortic root 40 mm by chest CTA 09/2022   Anemia 04/05/2012   Anxiety    Anxiety state 09/11/2007   Qualifier: Diagnosis of  By: Everett Graff     Asthma    Atrial tachycardia, paroxysmal (HCC)    Back pain 10/02/2014   Breast cancer (HCC) 02/17/2017   CAD (coronary artery disease), native coronary artery    remote Cath with nonobstructive ASCAD with 20% mid LAD, 20% prox left circ and 20% ostial RCA   Chicken pox as a child   Chronic diastolic CHF (congestive heart failure), NYHA class 1 (HCC)    Complication of anesthesia    pt states feels different in her body after anesthesia when waking up and also experiences a smell of burnt plastic for approx a wk    Constipation    Depression    Diabetes (HCC)    Dilated aortic root (HCC)    42mm by echo 08/2020   Dysuria 02/17/2017   Elevated LFTs 04/05/2012   GERD (gastroesophageal reflux disease)    Goiter    Heart murmur    hx of one at birth    History of kidney stones    History of radiation therapy 10/31/16-12/17/16   left breast 45 Gy in 25 fractions, left breast boost 16 Gy in 8 fractions   Hx of colonic polyps    Hyperlipidemia    Hypertension    Insomnia 10/08/2016  Joint pain    Malignant neoplasm of upper-outer quadrant of left female breast (HCC) 05/14/2016   not with patient   Measles as a child   Mumps as a child   OA (osteoarthritis) of knee 04/05/2012   Obesity 07/11/2014   PONV (postoperative nausea and vomiting)    Preventative health care 09/05/2013   PVC's (premature ventricular contractions) 05/02/2016   Shortness of breath dyspnea    walking distances / climbing stairs   Sleep apnea 04/05/2012   Tinea corporis 02/23/2013   Type 2 diabetes mellitus with hyperglycemia (HCC) 12/31/2013   Vasomotor rhinitis 04/05/2012   Ventral  hernia    Wheezing     Past Surgical History:  Procedure Laterality Date   BREAST LUMPECTOMY Left 2017   BREAST LUMPECTOMY WITH RADIOACTIVE SEED AND SENTINEL LYMPH NODE BIOPSY Left 06/21/2016   Procedure: LEFT BREAST LUMPECTOMY WITH RADIOACTIVE SEED AND SENTINEL LYMPH NODE BIOPSY, INJECT BLUE DYE LEFT BREAST;  Surgeon: Claud Kelp, MD;  Location: [facility] OR;  Service: General;  Laterality: Left;   CARDIAC CATHETERIZATION     normal coroary arteries per patient   CARDIOVASCULAR STRESS TEST     10/12/2013   CESAREAN SECTION     X 3   CHOLECYSTECTOMY     COLONOSCOPY WITH PROPOFOL N/A 03/28/2015   Procedure: COLONOSCOPY WITH PROPOFOL;  Surgeon: Charna Elizabeth, MD;  Location: [facility] ENDOSCOPY;  Service: Endoscopy;  Laterality: N/A;   HERNIA REPAIR  02/08/2011   ventral hernia   JOINT REPLACEMENT     bilateral   KNEE ARTHROSCOPY  05/2010   bilateral   LEFT AND RIGHT HEART CATHETERIZATION WITH CORONARY ANGIOGRAM N/A 11/08/2013   Procedure: LEFT AND RIGHT HEART CATHETERIZATION WITH CORONARY ANGIOGRAM;  Surgeon: Kathleene Hazel, MD;  Location: [facility] CATH LAB;  Service: Cardiovascular;  Laterality: N/A;   MENISCUS REPAIR  2009   MOUTH SURGERY     teeth implants   PORT-A-CATH REMOVAL N/A 01/24/2017   Procedure: REMOVAL PORT-A-CATH;  Surgeon: Claud Kelp, MD;  Location: [facility] OR;  Service: General;  Laterality: N/A;   PORTACATH PLACEMENT Right 07/16/2016   Procedure: INSERTION PORT-A-CATH RIGHT INTERNAL JUGULAR WITH ULTRASOUND;  Surgeon: Claud Kelp, MD;  Location: [facility] OR;  Service: General;  Laterality: Right;   REPAIR DURAL / CSF LEAK  2021   performed at [facility] in [place]   TOTAL KNEE ARTHROPLASTY  2011   left   TOTAL KNEE ARTHROPLASTY Right 05/10/2014   Procedure: RIGHT TOTAL KNEE ARTHROPLASTY;  Surgeon: Shelda Pal, MD;  Location: [facility] ORS;  Service: Orthopedics;  Laterality: Right;   TOTAL THYROIDECTOMY Bilateral 2022   TUBAL LIGATION     WISDOM TOOTH EXTRACTION  2000     Family History  Problem Relation Age of Onset   Thyroid disease Mother        hypothyroid   Hyperlipidemia Mother    Depression Mother    Anxiety disorder Mother    Heart attack Father 28   Hypertension Father    Arthritis Father        RA   Coronary artery disease Father    High Cholesterol Father    Coronary artery disease Brother    Heart disease Brother    Cancer Maternal Grandmother        colon   Heart attack Maternal Grandfather    Alcohol abuse Paternal Grandfather    Cancer Maternal Aunt        colon    Social History   Socioeconomic  History   Marital status: Divorced    Spouse name: Not on file   Number of children: 3   Years of education: Not on file   Highest education level: Some college, no degree  Occupational History   Occupation: retired - [place]  Tobacco Use   Smoking status: Never   Smokeless tobacco: Never  Vaping Use   Vaping status: Never Used  Substance and Sexual Activity   Alcohol use: Not Currently    Comment: rarely   Drug use: No   Sexual activity: Never  Other Topics Concern   Not on file  Social History Narrative   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)   Social Drivers of Health   Financial Resource Strain: Low Risk  (08/19/2023)   Overall Financial Resource Strain (CARDIA)    Difficulty of Paying Living Expenses: Not hard at all  Food Insecurity: No Food Insecurity (08/19/2023)   Hunger Vital Sign    Worried About Running Out of Food in the Last Year: Never true    Ran Out of Food in the Last Year: Never true  Transportation Needs: No Transportation Needs (08/19/2023)   PRAPARE - Administrator, Civil Service (Medical): No    Lack of Transportation (Non-Medical): No  Physical Activity: Insufficiently Active (08/19/2023)   Exercise Vital Sign    Days of Exercise per Week: 1 day    Minutes of Exercise per Session: 10 min  Stress: No Stress Concern Present (08/19/2023)   [employer]  of Occupational Health - Occupational Stress Questionnaire    Feeling of Stress : Only a little  Social Connections: Moderately Integrated (08/19/2023)   Social Connection and Isolation Panel [NHANES]    Frequency of Communication with Friends and Family: Three times a week    Frequency of Social Gatherings with Friends and Family: Once a week    Attends Religious Services: More than 4 times per year    Active Member of [employer] or Organizations: Yes    Attends Engineer, structural: More than 4 times per year    Marital Status: Divorced  Intimate Partner Violence: Not At Risk (08/19/2023)   Humiliation, Afraid, Rape, and Kick questionnaire    Fear of Current or Ex-Partner: No    Emotionally Abused: No    Physically Abused: No    Sexually Abused: No    Outpatient Medications Prior to Visit  Medication Sig Dispense Refill   Accu-Chek Softclix Lancets lancets Use to check blood glucose up to twice a day (Dx: Type 2 DM with long term insulin use E11.57, Z79.4) 100 each 5   acetaminophen (TYLENOL) 500 MG tablet Take 1,000 mg by mouth 2 (two) times daily as needed for moderate pain or headache.     albuterol (VENTOLIN HFA) 108 (90 Base) MCG/ACT inhaler Inhale 2 puffs every 4-6 hours as needed - rescue. 18 g 12   anastrozole (ARIMIDEX) 1 MG tablet TAKE ONE TABLET DAILY 90 tablet 3   aspirin EC 81 MG tablet Take 81 mg by mouth daily. Swallow whole.     blood glucose meter kit and supplies Dispense based on patient and insurance preference. Use up to four times daily as directed. (FOR ICD-10 E10.9, E11.9). 1 each 0   budesonide-formoterol (SYMBICORT) 160-4.5 MCG/ACT inhaler Inhale 2 puffs into the lungs 2 (two) times daily. As needed     busPIRone (BUSPAR) 7.5 MG tablet TAKE ONE TABLET BY MOUTH THREE TIMES DAILY AS NEEDED FOR ANXIETY  90 tablet 5   carvedilol (COREG) 25 MG tablet Take 1 tablet (25 mg total) by mouth 2 (two) times daily with a meal. 180 tablet 1   cholecalciferol (VITAMIN D3)  25 MCG (1000 UNIT) tablet Take 2,000 Units by mouth daily.     Continuous Glucose Sensor (FREESTYLE LIBRE 3 SENSOR) MISC 1 each by Does not apply route every 14 (fourteen) days. Use to check blood glucose continuously. Change sensor every 14 days.     escitalopram (LEXAPRO) 20 MG tablet TAKE ONE TABLET EVERY MORNING AND ONE-HALF TABLET EVERY EVENING 135 tablet 0   esomeprazole (NEXIUM) 40 MG capsule Take 1 capsule (40 mg total) by mouth daily. 90 capsule 0   ezetimibe (ZETIA) 10 MG tablet Take 1 tablet (10 mg total) by mouth daily. 90 tablet 0   fenofibrate (TRICOR) 145 MG tablet Take 1 tablet (145 mg total) by mouth daily. 90 tablet 1   ferrous sulfate 325 (65 FE) MG EC tablet Take 325 mg by mouth every other day.     fluticasone (FLONASE) 50 MCG/ACT nasal spray Place 2 sprays into both nostrils daily. 16 g 6   furosemide (LASIX) 20 MG tablet Take 1 tablet (20 mg total) by mouth daily. 90 tablet 0   glucose blood (ACCU-CHEK AVIVA) test strip Twice daily (please use brand that patient insurance covers) 100 each 5   GNP ULTICARE PEN NEEDLES 32G X 4 MM MISC AS DIRECTED DAILY 100 each 3   insulin degludec (TRESIBA FLEXTOUCH) 200 UNIT/ML FlexTouch Pen Inject 12-20 Units into the skin daily. (Patient taking differently: Inject 8 Units into the skin daily.) 9 mL 3   levothyroxine (SYNTHROID) 200 MCG tablet Take 1 tablet (200 mcg total) by mouth daily before breakfast. Take with a tablet daily to equal daily 90 tablet 0   levothyroxine (SYNTHROID) 50 MCG tablet Take 1 tablet (50 mcg total) by mouth daily before breakfast. Take with tablet daily to equal 90 tablet 0   loratadine (CLARITIN) 10 MG tablet Take 1 tablet (10 mg total) by mouth daily. 30 tablet 11   losartan (COZAAR) 100 MG tablet Take 1 tablet (100 mg total) by mouth daily. 90 tablet 0   metFORMIN (GLUCOPHAGE-XR) 500 MG 24 hr tablet Take 4 tablets (2,000 mg total) by mouth daily with supper. 360 tablet 0   montelukast  (SINGULAIR) 10 MG tablet Take 1 tablet (10 mg total) by mouth at bedtime. 90 tablet 1   rosuvastatin (CRESTOR) 40 MG tablet Take 1 tablet (40 mg total) by mouth daily. 90 tablet 0   Semaglutide, 1 MG/DOSE, (OZEMPIC, 1 MG/DOSE,) 4 MG/3ML SOPN Inject 1 mg into the skin once a week. 9 mL 3   Vitamin D, Ergocalciferol, (DRISDOL) 1.25 MG (50000 UNIT) CAPS capsule Take 1 capsule (50,000 Units total) by mouth every 7 (seven) days. 4 capsule 4   diltiazem (CARDIZEM CD) 120 MG 24 hr capsule Take 1 capsule (120 mg total) by mouth daily. 90 capsule 0   Facility-Administered Medications Prior to Visit  Medication Dose Route Frequency Provider Last Rate Last Admin   [START ON 11/27/2023] denosumab (PROLIA) injection 60 mg  60 mg Subcutaneous Q6 months [name], MD        Allergies  Allergen Reactions   Oxycodone Other (See Comments)    Does not feel good. Makes her sleepy.    Review of Systems  Constitutional:  Positive for malaise/fatigue. Negative for fever.  HENT:  Negative for congestion.  Eyes:  Negative for blurred vision.  Respiratory:  Negative for shortness of breath.   Cardiovascular:  Negative for chest pain, palpitations and leg swelling.  Gastrointestinal:  Negative for abdominal pain, blood in stool and nausea.  Genitourinary:  Negative for dysuria and frequency.  Musculoskeletal:  Negative for falls.  Skin:  Negative for rash.  Neurological:  Negative for dizziness, loss of consciousness and headaches.  Endo/Heme/Allergies:  Negative for environmental allergies.  Psychiatric/Behavioral:  Negative for depression. The patient is not nervous/anxious.        Objective:    Physical Exam Constitutional:      General: She is not in acute distress.    Appearance: Normal appearance. She is well-developed. She is not toxic-appearing.  HENT:     Head: Normocephalic and atraumatic.     Right Ear: External ear normal.     Left Ear: External ear normal.     Nose: Nose normal.   Eyes:     General:        Right eye: No discharge.        Left eye: No discharge.     Conjunctiva/sclera: Conjunctivae normal.  Neck:     Thyroid: No thyromegaly.  Cardiovascular:     Rate and Rhythm: Normal rate and regular rhythm.     Heart sounds: Normal heart sounds. No murmur heard. Pulmonary:     Effort: Pulmonary effort is normal. No respiratory distress.     Breath sounds: Normal breath sounds.  Abdominal:     General: Bowel sounds are normal.     Palpations: Abdomen is soft.     Tenderness: There is no abdominal tenderness. There is no guarding.  Musculoskeletal:        General: Normal range of motion.     Cervical back: Neck supple.  Lymphadenopathy:     Cervical: No cervical adenopathy.  Skin:    General: Skin is warm and dry.  Neurological:     Mental Status: She is alert and oriented to person, place, and time.  Psychiatric:        Mood and Affect: Mood normal.        Behavior: Behavior normal.        Thought Content: Thought content normal.        Judgment: Judgment normal.     There were no vitals taken for this visit. Wt Readings from Last 3 Encounters:  09/11/23 294 lb 1.5 oz (133.4 kg)  09/11/23 294 lb 1.5 oz (133.4 kg)  08/19/23 294 lb (133.4 kg)    Diabetic Foot Exam - Simple   No data filed    Lab Results  Component Value Date   WBC 9.4 09/11/2023   HGB 14.6 09/11/2023   HGB 13.0 09/11/2023   HCT 43.0 09/11/2023   HCT 42.1 09/11/2023   PLT 255 09/11/2023   GLUCOSE 131 (H) 09/11/2023   GLUCOSE 134 (H) 09/11/2023   CHOL 114 07/17/2023   TRIG 179.0 (H) 07/17/2023   HDL 40.10 07/17/2023   LDLDIRECT 66.0 10/24/2022   LDLCALC 38 07/17/2023   ALT 35 09/11/2023   AST 48 (H) 09/11/2023   NA 137 09/11/2023   NA 136 09/11/2023   K 3.9 09/11/2023   K 3.9 09/11/2023   CL 102 09/11/2023   CL 102 09/11/2023   CREATININE 0.70 09/11/2023   CREATININE 0.79 09/11/2023   BUN 16 09/11/2023   BUN 14 09/11/2023   CO2 23 09/11/2023   TSH 2.18  07/17/2023   INR  1.1 09/11/2023   HGBA1C 7.4 (H) 03/05/2023   MICROALBUR 18.1 (H) 03/05/2023    Lab Results  Component Value Date   TSH 2.18 07/17/2023   Lab Results  Component Value Date   WBC 9.4 09/11/2023   HGB 14.6 09/11/2023   HGB 13.0 09/11/2023   HCT 43.0 09/11/2023   HCT 42.1 09/11/2023   MCV 87.7 09/11/2023   PLT 255 09/11/2023   Lab Results  Component Value Date   NA 137 09/11/2023   NA 136 09/11/2023   K 3.9 09/11/2023   K 3.9 09/11/2023   CHLORIDE 107 04/11/2017   CO2 23 09/11/2023   GLUCOSE 131 (H) 09/11/2023   GLUCOSE 134 (H) 09/11/2023   BUN 16 09/11/2023   BUN 14 09/11/2023   CREATININE 0.70 09/11/2023   CREATININE 0.79 09/11/2023   BILITOT 0.7 09/11/2023   ALKPHOS 55 09/11/2023   AST 48 (H) 09/11/2023   ALT 35 09/11/2023   PROT 7.6 09/11/2023   ALBUMIN 3.6 09/11/2023   CALCIUM 9.5 09/11/2023   ANIONGAP 11 09/11/2023   EGFR >60 04/11/2017   GFR 88.83 07/17/2023   Lab Results  Component Value Date   CHOL 114 07/17/2023   Lab Results  Component Value Date   HDL 40.10 07/17/2023   Lab Results  Component Value Date   LDLCALC 38 07/17/2023   Lab Results  Component Value Date   TRIG 179.0 (H) 07/17/2023   Lab Results  Component Value Date   CHOLHDL 3 07/17/2023   Lab Results  Component Value Date   HGBA1C 7.4 (H) 03/05/2023       Assessment & Plan:  Atrial tachycardia, paroxysmal (HCC) Assessment & Plan: Rate controlled   Vitamin D deficiency Assessment & Plan: Supplement and monitor   Uncontrolled type 2 diabetes mellitus with hyperglycemia (HCC) Assessment & Plan: hgba1c unacceptable, minimize simple carbs. Increase exercise as tolerated. Continue current meds   Orders: -     Hemoglobin A1c; Future -     Microalbumin / creatinine urine ratio; Future  Class 2 severe obesity due to excess calories with serious comorbidity in adult, unspecified BMI (HCC) Assessment & Plan: Encouraged DASH or MIND diet, decrease po  intake and increase exercise as tolerated. Needs 7-8 hours of sleep nightly. Avoid trans fats, eat small, frequent meals every 4-5 hours with lean proteins, complex carbs and healthy fats. Minimize simple carbs, high fat foods and processed foods    Postoperative hypothyroidism Assessment & Plan: On Levothyroxine, continue to monitor   Pure hypercholesterolemia Assessment & Plan: Tolerating statin, encouraged heart healthy diet, avoid trans fats, minimize simple carbs and saturated fats. Increase exercise as tolerated  Orders: -     Lipid panel; Future  Essential hypertension Assessment & Plan: Well controlled, no changes to meds. Encouraged heart healthy diet such as the DASH diet and exercise as tolerated.   Orders: -     Comprehensive metabolic panel with GFR; Future -     CBC with Differential/Platelet; Future -     TSH; Future  Chronic diastolic CHF (congestive heart failure) (HCC) Assessment & Plan: No recent exacerbation   Hemorrhagic stroke [facility]) -     Ambulatory referral to Neurology  Other orders -     dilTIAZem HCl ER Coated Beads; Take 1 capsule (180 mg total) by mouth daily.  Dispense: 30 capsule; Refill: 2 -     Butalbital-Acetaminophen; Take 1 tablet by mouth 2 (two) times daily as needed (HA).  Dispense: 40 tablet; Refill: 1  Assessment and Plan Assessment & Plan Hemorrhagic Stroke Hemorrhagic stroke with right temporal lobe bleeding, increased intracranial pressure, headache, and seizure. MRI showed a small lipoma possibly contributing to bleeding. No residual weakness or confusion, persistent headache. - Refer to neurology for MRI review and lipoma assessment. - Increase diltiazem to 180 mg for blood pressure management. - Prescribe butalbital with acetaminophen for headache. - Avoid NSAIDs to prevent bleeding.  Hypertension Elevated blood pressure in the 170s to 180s, expected to improve post-stroke. Pain and stress may contribute. - Increase  diltiazem to 180 mg for better control. - Monitor blood pressure at home.  Urine Albumin-Creatinine Ratio (UACR) UACR indicates kidney dysfunction, influenced by multiple factors. Previous results affected by lab error. Losartan and semaglutide may offer renal protection. - Order new UACR test. - Continue losartan. - Consider semaglutide for renal protection.     Danise Edge, MD

## 2023-09-21 ENCOUNTER — Encounter: Payer: Self-pay | Admitting: Family Medicine

## 2023-09-22 DIAGNOSIS — G8194 Hemiplegia, unspecified affecting left nondominant side: Secondary | ICD-10-CM | POA: Diagnosis not present

## 2023-09-22 DIAGNOSIS — Z79811 Long term (current) use of aromatase inhibitors: Secondary | ICD-10-CM | POA: Diagnosis not present

## 2023-09-22 DIAGNOSIS — G9601 Cranial cerebrospinal fluid leak, spontaneous: Secondary | ICD-10-CM | POA: Diagnosis not present

## 2023-09-22 DIAGNOSIS — I10 Essential (primary) hypertension: Secondary | ICD-10-CM | POA: Diagnosis not present

## 2023-09-22 DIAGNOSIS — Z7984 Long term (current) use of oral hypoglycemic drugs: Secondary | ICD-10-CM | POA: Diagnosis not present

## 2023-09-22 DIAGNOSIS — I517 Cardiomegaly: Secondary | ICD-10-CM | POA: Diagnosis not present

## 2023-09-22 DIAGNOSIS — Z853 Personal history of malignant neoplasm of breast: Secondary | ICD-10-CM | POA: Diagnosis not present

## 2023-09-22 DIAGNOSIS — B957 Other staphylococcus as the cause of diseases classified elsewhere: Secondary | ICD-10-CM | POA: Diagnosis not present

## 2023-09-22 DIAGNOSIS — Z794 Long term (current) use of insulin: Secondary | ICD-10-CM | POA: Diagnosis not present

## 2023-09-22 DIAGNOSIS — G936 Cerebral edema: Secondary | ICD-10-CM | POA: Diagnosis not present

## 2023-09-22 DIAGNOSIS — I619 Nontraumatic intracerebral hemorrhage, unspecified: Secondary | ICD-10-CM | POA: Diagnosis not present

## 2023-09-22 DIAGNOSIS — E1151 Type 2 diabetes mellitus with diabetic peripheral angiopathy without gangrene: Secondary | ICD-10-CM | POA: Diagnosis not present

## 2023-09-22 DIAGNOSIS — I2510 Atherosclerotic heart disease of native coronary artery without angina pectoris: Secondary | ICD-10-CM | POA: Diagnosis not present

## 2023-09-22 DIAGNOSIS — Z792 Long term (current) use of antibiotics: Secondary | ICD-10-CM | POA: Diagnosis not present

## 2023-09-22 DIAGNOSIS — R160 Hepatomegaly, not elsewhere classified: Secondary | ICD-10-CM | POA: Diagnosis not present

## 2023-09-22 DIAGNOSIS — Z7985 Long-term (current) use of injectable non-insulin antidiabetic drugs: Secondary | ICD-10-CM | POA: Diagnosis not present

## 2023-09-22 DIAGNOSIS — I5032 Chronic diastolic (congestive) heart failure: Secondary | ICD-10-CM | POA: Diagnosis not present

## 2023-09-22 DIAGNOSIS — I4719 Other supraventricular tachycardia: Secondary | ICD-10-CM | POA: Diagnosis not present

## 2023-09-22 DIAGNOSIS — G9389 Other specified disorders of brain: Secondary | ICD-10-CM | POA: Diagnosis not present

## 2023-09-22 DIAGNOSIS — G319 Degenerative disease of nervous system, unspecified: Secondary | ICD-10-CM | POA: Diagnosis not present

## 2023-09-22 DIAGNOSIS — Z043 Encounter for examination and observation following other accident: Secondary | ICD-10-CM | POA: Diagnosis not present

## 2023-09-22 DIAGNOSIS — I519 Heart disease, unspecified: Secondary | ICD-10-CM | POA: Diagnosis not present

## 2023-09-22 DIAGNOSIS — E785 Hyperlipidemia, unspecified: Secondary | ICD-10-CM | POA: Diagnosis not present

## 2023-09-22 DIAGNOSIS — G060 Intracranial abscess and granuloma: Secondary | ICD-10-CM | POA: Diagnosis not present

## 2023-09-22 DIAGNOSIS — I110 Hypertensive heart disease with heart failure: Secondary | ICD-10-CM | POA: Diagnosis not present

## 2023-09-22 DIAGNOSIS — J3489 Other specified disorders of nose and nasal sinuses: Secondary | ICD-10-CM | POA: Diagnosis not present

## 2023-09-22 DIAGNOSIS — H9193 Unspecified hearing loss, bilateral: Secondary | ICD-10-CM | POA: Diagnosis not present

## 2023-09-22 DIAGNOSIS — I161 Hypertensive emergency: Secondary | ICD-10-CM | POA: Diagnosis not present

## 2023-09-22 DIAGNOSIS — R0902 Hypoxemia: Secondary | ICD-10-CM | POA: Diagnosis not present

## 2023-09-22 DIAGNOSIS — G9600 Cerebrospinal fluid leak, unspecified: Secondary | ICD-10-CM | POA: Diagnosis not present

## 2023-09-22 DIAGNOSIS — Z7982 Long term (current) use of aspirin: Secondary | ICD-10-CM | POA: Diagnosis not present

## 2023-09-22 DIAGNOSIS — R569 Unspecified convulsions: Secondary | ICD-10-CM | POA: Diagnosis not present

## 2023-09-22 DIAGNOSIS — G9608 Other cranial cerebrospinal fluid leak: Secondary | ICD-10-CM | POA: Diagnosis not present

## 2023-09-22 DIAGNOSIS — Q018 Encephalocele of other sites: Secondary | ICD-10-CM | POA: Diagnosis not present

## 2023-09-22 DIAGNOSIS — I77810 Thoracic aortic ectasia: Secondary | ICD-10-CM | POA: Diagnosis not present

## 2023-09-22 DIAGNOSIS — E890 Postprocedural hypothyroidism: Secondary | ICD-10-CM | POA: Diagnosis not present

## 2023-09-22 DIAGNOSIS — E119 Type 2 diabetes mellitus without complications: Secondary | ICD-10-CM | POA: Diagnosis not present

## 2023-09-22 DIAGNOSIS — B9561 Methicillin susceptible Staphylococcus aureus infection as the cause of diseases classified elsewhere: Secondary | ICD-10-CM | POA: Diagnosis not present

## 2023-09-22 DIAGNOSIS — I615 Nontraumatic intracerebral hemorrhage, intraventricular: Secondary | ICD-10-CM | POA: Diagnosis not present

## 2023-09-22 DIAGNOSIS — I272 Pulmonary hypertension, unspecified: Secondary | ICD-10-CM | POA: Diagnosis not present

## 2023-09-22 DIAGNOSIS — I614 Nontraumatic intracerebral hemorrhage in cerebellum: Secondary | ICD-10-CM | POA: Diagnosis not present

## 2023-09-22 DIAGNOSIS — Z452 Encounter for adjustment and management of vascular access device: Secondary | ICD-10-CM | POA: Diagnosis not present

## 2023-09-22 DIAGNOSIS — I7121 Aneurysm of the ascending aorta, without rupture: Secondary | ICD-10-CM | POA: Diagnosis not present

## 2023-09-23 DIAGNOSIS — I619 Nontraumatic intracerebral hemorrhage, unspecified: Secondary | ICD-10-CM | POA: Diagnosis not present

## 2023-09-23 DIAGNOSIS — Q018 Encephalocele of other sites: Secondary | ICD-10-CM | POA: Diagnosis not present

## 2023-09-23 DIAGNOSIS — G936 Cerebral edema: Secondary | ICD-10-CM | POA: Diagnosis not present

## 2023-09-23 DIAGNOSIS — G9389 Other specified disorders of brain: Secondary | ICD-10-CM | POA: Diagnosis not present

## 2023-09-23 DIAGNOSIS — G060 Intracranial abscess and granuloma: Secondary | ICD-10-CM | POA: Diagnosis not present

## 2023-09-23 NOTE — Telephone Encounter (Signed)
 Called and spoke with pharmacist at [facility]. He said the insurance may cover Fioricet, if the patient is OK with the caffeine in it.

## 2023-09-24 DIAGNOSIS — G936 Cerebral edema: Secondary | ICD-10-CM | POA: Diagnosis not present

## 2023-09-24 DIAGNOSIS — I272 Pulmonary hypertension, unspecified: Secondary | ICD-10-CM | POA: Diagnosis not present

## 2023-09-24 DIAGNOSIS — I7121 Aneurysm of the ascending aorta, without rupture: Secondary | ICD-10-CM | POA: Diagnosis not present

## 2023-09-24 DIAGNOSIS — G9389 Other specified disorders of brain: Secondary | ICD-10-CM | POA: Diagnosis not present

## 2023-09-24 DIAGNOSIS — I619 Nontraumatic intracerebral hemorrhage, unspecified: Secondary | ICD-10-CM | POA: Diagnosis not present

## 2023-09-24 DIAGNOSIS — G060 Intracranial abscess and granuloma: Secondary | ICD-10-CM | POA: Diagnosis not present

## 2023-09-24 DIAGNOSIS — I517 Cardiomegaly: Secondary | ICD-10-CM | POA: Diagnosis not present

## 2023-09-24 DIAGNOSIS — Z792 Long term (current) use of antibiotics: Secondary | ICD-10-CM | POA: Diagnosis not present

## 2023-09-24 DIAGNOSIS — B957 Other staphylococcus as the cause of diseases classified elsewhere: Secondary | ICD-10-CM | POA: Diagnosis not present

## 2023-09-24 DIAGNOSIS — J3489 Other specified disorders of nose and nasal sinuses: Secondary | ICD-10-CM | POA: Diagnosis not present

## 2023-09-24 DIAGNOSIS — R160 Hepatomegaly, not elsewhere classified: Secondary | ICD-10-CM | POA: Diagnosis not present

## 2023-09-24 DIAGNOSIS — R569 Unspecified convulsions: Secondary | ICD-10-CM | POA: Diagnosis not present

## 2023-09-25 ENCOUNTER — Other Ambulatory Visit (HOSPITAL_COMMUNITY): Payer: Self-pay

## 2023-09-25 ENCOUNTER — Telehealth: Payer: Self-pay

## 2023-09-25 DIAGNOSIS — G9608 Other cranial cerebrospinal fluid leak: Secondary | ICD-10-CM | POA: Diagnosis not present

## 2023-09-25 DIAGNOSIS — E119 Type 2 diabetes mellitus without complications: Secondary | ICD-10-CM | POA: Diagnosis not present

## 2023-09-25 DIAGNOSIS — G060 Intracranial abscess and granuloma: Secondary | ICD-10-CM | POA: Diagnosis not present

## 2023-09-25 DIAGNOSIS — G936 Cerebral edema: Secondary | ICD-10-CM | POA: Diagnosis not present

## 2023-09-25 DIAGNOSIS — I519 Heart disease, unspecified: Secondary | ICD-10-CM | POA: Diagnosis not present

## 2023-09-25 DIAGNOSIS — I77810 Thoracic aortic ectasia: Secondary | ICD-10-CM | POA: Diagnosis not present

## 2023-09-25 DIAGNOSIS — I517 Cardiomegaly: Secondary | ICD-10-CM | POA: Diagnosis not present

## 2023-09-25 DIAGNOSIS — B9561 Methicillin susceptible Staphylococcus aureus infection as the cause of diseases classified elsewhere: Secondary | ICD-10-CM | POA: Diagnosis not present

## 2023-09-25 DIAGNOSIS — I7121 Aneurysm of the ascending aorta, without rupture: Secondary | ICD-10-CM | POA: Diagnosis not present

## 2023-09-25 NOTE — Telephone Encounter (Signed)
 This drug is actually not covered under the patients plan.

## 2023-09-25 NOTE — Telephone Encounter (Signed)
 Patient Assistance  Medication:Tresiba  Dosage:U200 Quantity:2 boxes   Medication: Ozempic  Dosage:1 mg  Quantity: 4 boxes

## 2023-09-25 NOTE — Telephone Encounter (Signed)
 She can use a good rx card at [facility] for $58.79

## 2023-09-25 NOTE — Telephone Encounter (Signed)
 Pharmacy Patient Advocate Encounter   Received notification from CoverMyMeds that prior authorization for Butalbital-Acetaminophen 50-300MG  tablets is required/requested.   Insurance verification completed.   The patient is insured through [employer] .   Per test claim: PA required; PA submitted to above mentioned insurance via CoverMyMeds Key/confirmation #/EOC [id number] Status is pending

## 2023-09-26 ENCOUNTER — Encounter: Payer: Self-pay | Admitting: Emergency Medicine

## 2023-09-26 DIAGNOSIS — G9600 Cerebrospinal fluid leak, unspecified: Secondary | ICD-10-CM | POA: Diagnosis not present

## 2023-09-26 DIAGNOSIS — I7121 Aneurysm of the ascending aorta, without rupture: Secondary | ICD-10-CM | POA: Diagnosis not present

## 2023-09-26 DIAGNOSIS — E119 Type 2 diabetes mellitus without complications: Secondary | ICD-10-CM | POA: Diagnosis not present

## 2023-09-26 DIAGNOSIS — G936 Cerebral edema: Secondary | ICD-10-CM | POA: Diagnosis not present

## 2023-09-26 DIAGNOSIS — G060 Intracranial abscess and granuloma: Secondary | ICD-10-CM | POA: Diagnosis not present

## 2023-09-26 DIAGNOSIS — B9561 Methicillin susceptible Staphylococcus aureus infection as the cause of diseases classified elsewhere: Secondary | ICD-10-CM | POA: Diagnosis not present

## 2023-09-27 DIAGNOSIS — I7121 Aneurysm of the ascending aorta, without rupture: Secondary | ICD-10-CM | POA: Diagnosis not present

## 2023-09-27 DIAGNOSIS — G060 Intracranial abscess and granuloma: Secondary | ICD-10-CM | POA: Diagnosis not present

## 2023-09-27 DIAGNOSIS — G9608 Other cranial cerebrospinal fluid leak: Secondary | ICD-10-CM | POA: Diagnosis not present

## 2023-09-27 DIAGNOSIS — E119 Type 2 diabetes mellitus without complications: Secondary | ICD-10-CM | POA: Diagnosis not present

## 2023-09-27 DIAGNOSIS — B9561 Methicillin susceptible Staphylococcus aureus infection as the cause of diseases classified elsewhere: Secondary | ICD-10-CM | POA: Diagnosis not present

## 2023-09-28 DIAGNOSIS — B9561 Methicillin susceptible Staphylococcus aureus infection as the cause of diseases classified elsewhere: Secondary | ICD-10-CM | POA: Diagnosis not present

## 2023-09-28 DIAGNOSIS — G9608 Other cranial cerebrospinal fluid leak: Secondary | ICD-10-CM | POA: Diagnosis not present

## 2023-09-28 DIAGNOSIS — I7121 Aneurysm of the ascending aorta, without rupture: Secondary | ICD-10-CM | POA: Diagnosis not present

## 2023-09-28 DIAGNOSIS — E119 Type 2 diabetes mellitus without complications: Secondary | ICD-10-CM | POA: Diagnosis not present

## 2023-09-28 DIAGNOSIS — G060 Intracranial abscess and granuloma: Secondary | ICD-10-CM | POA: Diagnosis not present

## 2023-09-29 DIAGNOSIS — B9561 Methicillin susceptible Staphylococcus aureus infection as the cause of diseases classified elsewhere: Secondary | ICD-10-CM | POA: Diagnosis not present

## 2023-09-29 DIAGNOSIS — G060 Intracranial abscess and granuloma: Secondary | ICD-10-CM | POA: Diagnosis not present

## 2023-09-29 DIAGNOSIS — G9601 Cranial cerebrospinal fluid leak, spontaneous: Secondary | ICD-10-CM | POA: Diagnosis not present

## 2023-09-29 DIAGNOSIS — E119 Type 2 diabetes mellitus without complications: Secondary | ICD-10-CM | POA: Diagnosis not present

## 2023-09-29 DIAGNOSIS — G9608 Other cranial cerebrospinal fluid leak: Secondary | ICD-10-CM | POA: Diagnosis not present

## 2023-10-01 DIAGNOSIS — I10 Essential (primary) hypertension: Secondary | ICD-10-CM | POA: Diagnosis not present

## 2023-10-01 DIAGNOSIS — J4521 Mild intermittent asthma with (acute) exacerbation: Secondary | ICD-10-CM | POA: Diagnosis not present

## 2023-10-01 DIAGNOSIS — I5020 Unspecified systolic (congestive) heart failure: Secondary | ICD-10-CM | POA: Diagnosis not present

## 2023-10-01 DIAGNOSIS — I7121 Aneurysm of the ascending aorta, without rupture: Secondary | ICD-10-CM | POA: Diagnosis not present

## 2023-10-01 DIAGNOSIS — Z7982 Long term (current) use of aspirin: Secondary | ICD-10-CM | POA: Diagnosis not present

## 2023-10-01 DIAGNOSIS — R278 Other lack of coordination: Secondary | ICD-10-CM | POA: Diagnosis not present

## 2023-10-01 DIAGNOSIS — Z48811 Encounter for surgical aftercare following surgery on the nervous system: Secondary | ICD-10-CM | POA: Diagnosis not present

## 2023-10-01 DIAGNOSIS — E119 Type 2 diabetes mellitus without complications: Secondary | ICD-10-CM | POA: Diagnosis not present

## 2023-10-01 DIAGNOSIS — G9608 Other cranial cerebrospinal fluid leak: Secondary | ICD-10-CM | POA: Diagnosis not present

## 2023-10-01 DIAGNOSIS — B9561 Methicillin susceptible Staphylococcus aureus infection as the cause of diseases classified elsewhere: Secondary | ICD-10-CM | POA: Diagnosis not present

## 2023-10-01 DIAGNOSIS — R569 Unspecified convulsions: Secondary | ICD-10-CM | POA: Diagnosis not present

## 2023-10-01 DIAGNOSIS — R262 Difficulty in walking, not elsewhere classified: Secondary | ICD-10-CM | POA: Diagnosis not present

## 2023-10-01 DIAGNOSIS — I2510 Atherosclerotic heart disease of native coronary artery without angina pectoris: Secondary | ICD-10-CM | POA: Diagnosis not present

## 2023-10-01 DIAGNOSIS — G4489 Other headache syndrome: Secondary | ICD-10-CM | POA: Diagnosis not present

## 2023-10-01 DIAGNOSIS — G4733 Obstructive sleep apnea (adult) (pediatric): Secondary | ICD-10-CM | POA: Diagnosis not present

## 2023-10-01 DIAGNOSIS — E876 Hypokalemia: Secondary | ICD-10-CM | POA: Diagnosis not present

## 2023-10-01 DIAGNOSIS — R5381 Other malaise: Secondary | ICD-10-CM | POA: Diagnosis not present

## 2023-10-01 DIAGNOSIS — Z7984 Long term (current) use of oral hypoglycemic drugs: Secondary | ICD-10-CM | POA: Diagnosis not present

## 2023-10-01 DIAGNOSIS — G8194 Hemiplegia, unspecified affecting left nondominant side: Secondary | ICD-10-CM | POA: Diagnosis not present

## 2023-10-01 DIAGNOSIS — E890 Postprocedural hypothyroidism: Secondary | ICD-10-CM | POA: Diagnosis not present

## 2023-10-01 DIAGNOSIS — I619 Nontraumatic intracerebral hemorrhage, unspecified: Secondary | ICD-10-CM | POA: Diagnosis not present

## 2023-10-01 DIAGNOSIS — G936 Cerebral edema: Secondary | ICD-10-CM | POA: Diagnosis not present

## 2023-10-01 DIAGNOSIS — L02811 Cutaneous abscess of head [any part, except face]: Secondary | ICD-10-CM | POA: Diagnosis not present

## 2023-10-01 DIAGNOSIS — Z7985 Long-term (current) use of injectable non-insulin antidiabetic drugs: Secondary | ICD-10-CM | POA: Diagnosis not present

## 2023-10-01 DIAGNOSIS — I509 Heart failure, unspecified: Secondary | ICD-10-CM | POA: Diagnosis not present

## 2023-10-01 DIAGNOSIS — M6281 Muscle weakness (generalized): Secondary | ICD-10-CM | POA: Diagnosis not present

## 2023-10-01 DIAGNOSIS — Q018 Encephalocele of other sites: Secondary | ICD-10-CM | POA: Diagnosis not present

## 2023-10-01 DIAGNOSIS — I5032 Chronic diastolic (congestive) heart failure: Secondary | ICD-10-CM | POA: Diagnosis not present

## 2023-10-01 DIAGNOSIS — R197 Diarrhea, unspecified: Secondary | ICD-10-CM | POA: Diagnosis not present

## 2023-10-01 DIAGNOSIS — D509 Iron deficiency anemia, unspecified: Secondary | ICD-10-CM | POA: Diagnosis not present

## 2023-10-01 DIAGNOSIS — R279 Unspecified lack of coordination: Secondary | ICD-10-CM | POA: Diagnosis not present

## 2023-10-01 DIAGNOSIS — E785 Hyperlipidemia, unspecified: Secondary | ICD-10-CM | POA: Diagnosis not present

## 2023-10-01 DIAGNOSIS — Z794 Long term (current) use of insulin: Secondary | ICD-10-CM | POA: Diagnosis not present

## 2023-10-01 DIAGNOSIS — G060 Intracranial abscess and granuloma: Secondary | ICD-10-CM | POA: Diagnosis not present

## 2023-10-01 DIAGNOSIS — I110 Hypertensive heart disease with heart failure: Secondary | ICD-10-CM | POA: Diagnosis not present

## 2023-10-01 DIAGNOSIS — A4901 Methicillin susceptible Staphylococcus aureus infection, unspecified site: Secondary | ICD-10-CM | POA: Diagnosis not present

## 2023-10-01 DIAGNOSIS — G9601 Cranial cerebrospinal fluid leak, spontaneous: Secondary | ICD-10-CM | POA: Diagnosis not present

## 2023-10-02 DIAGNOSIS — I5020 Unspecified systolic (congestive) heart failure: Secondary | ICD-10-CM | POA: Diagnosis not present

## 2023-10-02 DIAGNOSIS — I7121 Aneurysm of the ascending aorta, without rupture: Secondary | ICD-10-CM | POA: Diagnosis not present

## 2023-10-02 DIAGNOSIS — G060 Intracranial abscess and granuloma: Secondary | ICD-10-CM | POA: Diagnosis not present

## 2023-10-02 DIAGNOSIS — I2510 Atherosclerotic heart disease of native coronary artery without angina pectoris: Secondary | ICD-10-CM | POA: Diagnosis not present

## 2023-10-02 DIAGNOSIS — I10 Essential (primary) hypertension: Secondary | ICD-10-CM | POA: Diagnosis not present

## 2023-10-02 DIAGNOSIS — E119 Type 2 diabetes mellitus without complications: Secondary | ICD-10-CM | POA: Diagnosis not present

## 2023-10-02 DIAGNOSIS — G936 Cerebral edema: Secondary | ICD-10-CM | POA: Diagnosis not present

## 2023-10-03 DIAGNOSIS — E119 Type 2 diabetes mellitus without complications: Secondary | ICD-10-CM | POA: Diagnosis not present

## 2023-10-03 DIAGNOSIS — I10 Essential (primary) hypertension: Secondary | ICD-10-CM | POA: Diagnosis not present

## 2023-10-03 DIAGNOSIS — G936 Cerebral edema: Secondary | ICD-10-CM | POA: Diagnosis not present

## 2023-10-03 DIAGNOSIS — I2510 Atherosclerotic heart disease of native coronary artery without angina pectoris: Secondary | ICD-10-CM | POA: Diagnosis not present

## 2023-10-03 DIAGNOSIS — I5020 Unspecified systolic (congestive) heart failure: Secondary | ICD-10-CM | POA: Diagnosis not present

## 2023-10-03 DIAGNOSIS — I7121 Aneurysm of the ascending aorta, without rupture: Secondary | ICD-10-CM | POA: Diagnosis not present

## 2023-10-03 DIAGNOSIS — G060 Intracranial abscess and granuloma: Secondary | ICD-10-CM | POA: Diagnosis not present

## 2023-10-04 DIAGNOSIS — I2510 Atherosclerotic heart disease of native coronary artery without angina pectoris: Secondary | ICD-10-CM | POA: Diagnosis not present

## 2023-10-04 DIAGNOSIS — I7121 Aneurysm of the ascending aorta, without rupture: Secondary | ICD-10-CM | POA: Diagnosis not present

## 2023-10-04 DIAGNOSIS — I5020 Unspecified systolic (congestive) heart failure: Secondary | ICD-10-CM | POA: Diagnosis not present

## 2023-10-04 DIAGNOSIS — G936 Cerebral edema: Secondary | ICD-10-CM | POA: Diagnosis not present

## 2023-10-04 DIAGNOSIS — I10 Essential (primary) hypertension: Secondary | ICD-10-CM | POA: Diagnosis not present

## 2023-10-04 DIAGNOSIS — E119 Type 2 diabetes mellitus without complications: Secondary | ICD-10-CM | POA: Diagnosis not present

## 2023-10-04 DIAGNOSIS — G060 Intracranial abscess and granuloma: Secondary | ICD-10-CM | POA: Diagnosis not present

## 2023-10-05 DIAGNOSIS — L02811 Cutaneous abscess of head [any part, except face]: Secondary | ICD-10-CM | POA: Diagnosis not present

## 2023-10-05 DIAGNOSIS — I10 Essential (primary) hypertension: Secondary | ICD-10-CM | POA: Diagnosis not present

## 2023-10-05 DIAGNOSIS — G936 Cerebral edema: Secondary | ICD-10-CM | POA: Diagnosis not present

## 2023-10-05 DIAGNOSIS — I5020 Unspecified systolic (congestive) heart failure: Secondary | ICD-10-CM | POA: Diagnosis not present

## 2023-10-05 DIAGNOSIS — E119 Type 2 diabetes mellitus without complications: Secondary | ICD-10-CM | POA: Diagnosis not present

## 2023-10-05 DIAGNOSIS — B9561 Methicillin susceptible Staphylococcus aureus infection as the cause of diseases classified elsewhere: Secondary | ICD-10-CM | POA: Diagnosis not present

## 2023-10-05 DIAGNOSIS — I7121 Aneurysm of the ascending aorta, without rupture: Secondary | ICD-10-CM | POA: Diagnosis not present

## 2023-10-05 DIAGNOSIS — I2510 Atherosclerotic heart disease of native coronary artery without angina pectoris: Secondary | ICD-10-CM | POA: Diagnosis not present

## 2023-10-06 DIAGNOSIS — G936 Cerebral edema: Secondary | ICD-10-CM | POA: Diagnosis not present

## 2023-10-06 DIAGNOSIS — I5020 Unspecified systolic (congestive) heart failure: Secondary | ICD-10-CM | POA: Diagnosis not present

## 2023-10-06 DIAGNOSIS — I2510 Atherosclerotic heart disease of native coronary artery without angina pectoris: Secondary | ICD-10-CM | POA: Diagnosis not present

## 2023-10-06 DIAGNOSIS — G060 Intracranial abscess and granuloma: Secondary | ICD-10-CM | POA: Diagnosis not present

## 2023-10-06 DIAGNOSIS — I7121 Aneurysm of the ascending aorta, without rupture: Secondary | ICD-10-CM | POA: Diagnosis not present

## 2023-10-06 DIAGNOSIS — E119 Type 2 diabetes mellitus without complications: Secondary | ICD-10-CM | POA: Diagnosis not present

## 2023-10-06 DIAGNOSIS — I10 Essential (primary) hypertension: Secondary | ICD-10-CM | POA: Diagnosis not present

## 2023-10-07 DIAGNOSIS — G936 Cerebral edema: Secondary | ICD-10-CM | POA: Diagnosis not present

## 2023-10-07 DIAGNOSIS — G060 Intracranial abscess and granuloma: Secondary | ICD-10-CM | POA: Diagnosis not present

## 2023-10-07 DIAGNOSIS — I2510 Atherosclerotic heart disease of native coronary artery without angina pectoris: Secondary | ICD-10-CM | POA: Diagnosis not present

## 2023-10-07 DIAGNOSIS — I7121 Aneurysm of the ascending aorta, without rupture: Secondary | ICD-10-CM | POA: Diagnosis not present

## 2023-10-07 DIAGNOSIS — I5020 Unspecified systolic (congestive) heart failure: Secondary | ICD-10-CM | POA: Diagnosis not present

## 2023-10-07 DIAGNOSIS — E119 Type 2 diabetes mellitus without complications: Secondary | ICD-10-CM | POA: Diagnosis not present

## 2023-10-07 DIAGNOSIS — I10 Essential (primary) hypertension: Secondary | ICD-10-CM | POA: Diagnosis not present

## 2023-10-08 ENCOUNTER — Other Ambulatory Visit: Payer: Self-pay | Admitting: Family Medicine

## 2023-10-08 DIAGNOSIS — I2510 Atherosclerotic heart disease of native coronary artery without angina pectoris: Secondary | ICD-10-CM | POA: Diagnosis not present

## 2023-10-08 DIAGNOSIS — E119 Type 2 diabetes mellitus without complications: Secondary | ICD-10-CM | POA: Diagnosis not present

## 2023-10-08 DIAGNOSIS — I10 Essential (primary) hypertension: Secondary | ICD-10-CM | POA: Diagnosis not present

## 2023-10-08 DIAGNOSIS — I5020 Unspecified systolic (congestive) heart failure: Secondary | ICD-10-CM | POA: Diagnosis not present

## 2023-10-08 DIAGNOSIS — G936 Cerebral edema: Secondary | ICD-10-CM | POA: Diagnosis not present

## 2023-10-08 DIAGNOSIS — I7121 Aneurysm of the ascending aorta, without rupture: Secondary | ICD-10-CM | POA: Diagnosis not present

## 2023-10-08 DIAGNOSIS — E039 Hypothyroidism, unspecified: Secondary | ICD-10-CM

## 2023-10-08 DIAGNOSIS — G060 Intracranial abscess and granuloma: Secondary | ICD-10-CM | POA: Diagnosis not present

## 2023-10-08 DIAGNOSIS — E559 Vitamin D deficiency, unspecified: Secondary | ICD-10-CM

## 2023-10-09 ENCOUNTER — Encounter: Payer: Self-pay | Admitting: Emergency Medicine

## 2023-10-09 DIAGNOSIS — I7121 Aneurysm of the ascending aorta, without rupture: Secondary | ICD-10-CM | POA: Diagnosis not present

## 2023-10-09 DIAGNOSIS — G060 Intracranial abscess and granuloma: Secondary | ICD-10-CM | POA: Diagnosis not present

## 2023-10-09 DIAGNOSIS — I5020 Unspecified systolic (congestive) heart failure: Secondary | ICD-10-CM | POA: Diagnosis not present

## 2023-10-09 DIAGNOSIS — I10 Essential (primary) hypertension: Secondary | ICD-10-CM | POA: Diagnosis not present

## 2023-10-09 DIAGNOSIS — G936 Cerebral edema: Secondary | ICD-10-CM | POA: Diagnosis not present

## 2023-10-09 DIAGNOSIS — I2510 Atherosclerotic heart disease of native coronary artery without angina pectoris: Secondary | ICD-10-CM | POA: Diagnosis not present

## 2023-10-09 DIAGNOSIS — E119 Type 2 diabetes mellitus without complications: Secondary | ICD-10-CM | POA: Diagnosis not present

## 2023-10-10 DIAGNOSIS — E119 Type 2 diabetes mellitus without complications: Secondary | ICD-10-CM | POA: Diagnosis not present

## 2023-10-10 DIAGNOSIS — I10 Essential (primary) hypertension: Secondary | ICD-10-CM | POA: Diagnosis not present

## 2023-10-10 DIAGNOSIS — I5020 Unspecified systolic (congestive) heart failure: Secondary | ICD-10-CM | POA: Diagnosis not present

## 2023-10-10 DIAGNOSIS — I7121 Aneurysm of the ascending aorta, without rupture: Secondary | ICD-10-CM | POA: Diagnosis not present

## 2023-10-10 DIAGNOSIS — I2510 Atherosclerotic heart disease of native coronary artery without angina pectoris: Secondary | ICD-10-CM | POA: Diagnosis not present

## 2023-10-10 DIAGNOSIS — G060 Intracranial abscess and granuloma: Secondary | ICD-10-CM | POA: Diagnosis not present

## 2023-10-10 DIAGNOSIS — G936 Cerebral edema: Secondary | ICD-10-CM | POA: Diagnosis not present

## 2023-10-13 DIAGNOSIS — I5020 Unspecified systolic (congestive) heart failure: Secondary | ICD-10-CM | POA: Diagnosis not present

## 2023-10-13 DIAGNOSIS — I7121 Aneurysm of the ascending aorta, without rupture: Secondary | ICD-10-CM | POA: Diagnosis not present

## 2023-10-13 DIAGNOSIS — G060 Intracranial abscess and granuloma: Secondary | ICD-10-CM | POA: Diagnosis not present

## 2023-10-13 DIAGNOSIS — I10 Essential (primary) hypertension: Secondary | ICD-10-CM | POA: Diagnosis not present

## 2023-10-13 DIAGNOSIS — E119 Type 2 diabetes mellitus without complications: Secondary | ICD-10-CM | POA: Diagnosis not present

## 2023-10-13 DIAGNOSIS — G936 Cerebral edema: Secondary | ICD-10-CM | POA: Diagnosis not present

## 2023-10-13 DIAGNOSIS — I2510 Atherosclerotic heart disease of native coronary artery without angina pectoris: Secondary | ICD-10-CM | POA: Diagnosis not present

## 2023-10-14 DIAGNOSIS — E119 Type 2 diabetes mellitus without complications: Secondary | ICD-10-CM | POA: Diagnosis not present

## 2023-10-14 DIAGNOSIS — I10 Essential (primary) hypertension: Secondary | ICD-10-CM | POA: Diagnosis not present

## 2023-10-14 DIAGNOSIS — I2510 Atherosclerotic heart disease of native coronary artery without angina pectoris: Secondary | ICD-10-CM | POA: Diagnosis not present

## 2023-10-14 DIAGNOSIS — I5020 Unspecified systolic (congestive) heart failure: Secondary | ICD-10-CM | POA: Diagnosis not present

## 2023-10-14 DIAGNOSIS — G936 Cerebral edema: Secondary | ICD-10-CM | POA: Diagnosis not present

## 2023-10-14 DIAGNOSIS — I7121 Aneurysm of the ascending aorta, without rupture: Secondary | ICD-10-CM | POA: Diagnosis not present

## 2023-10-14 DIAGNOSIS — G060 Intracranial abscess and granuloma: Secondary | ICD-10-CM | POA: Diagnosis not present

## 2023-10-16 DIAGNOSIS — G060 Intracranial abscess and granuloma: Secondary | ICD-10-CM | POA: Diagnosis not present

## 2023-10-16 DIAGNOSIS — B9561 Methicillin susceptible Staphylococcus aureus infection as the cause of diseases classified elsewhere: Secondary | ICD-10-CM | POA: Diagnosis not present

## 2023-10-16 DIAGNOSIS — E119 Type 2 diabetes mellitus without complications: Secondary | ICD-10-CM | POA: Diagnosis not present

## 2023-10-16 DIAGNOSIS — G9608 Other cranial cerebrospinal fluid leak: Secondary | ICD-10-CM | POA: Diagnosis not present

## 2023-10-16 DIAGNOSIS — R197 Diarrhea, unspecified: Secondary | ICD-10-CM | POA: Diagnosis not present

## 2023-10-17 DIAGNOSIS — G060 Intracranial abscess and granuloma: Secondary | ICD-10-CM | POA: Diagnosis not present

## 2023-10-17 DIAGNOSIS — I619 Nontraumatic intracerebral hemorrhage, unspecified: Secondary | ICD-10-CM | POA: Diagnosis not present

## 2023-10-17 DIAGNOSIS — L02811 Cutaneous abscess of head [any part, except face]: Secondary | ICD-10-CM | POA: Diagnosis not present

## 2023-10-17 DIAGNOSIS — J4521 Mild intermittent asthma with (acute) exacerbation: Secondary | ICD-10-CM | POA: Diagnosis not present

## 2023-10-17 DIAGNOSIS — I5020 Unspecified systolic (congestive) heart failure: Secondary | ICD-10-CM | POA: Diagnosis not present

## 2023-10-17 DIAGNOSIS — I509 Heart failure, unspecified: Secondary | ICD-10-CM | POA: Diagnosis not present

## 2023-10-17 DIAGNOSIS — R278 Other lack of coordination: Secondary | ICD-10-CM | POA: Diagnosis not present

## 2023-10-17 DIAGNOSIS — Z9889 Other specified postprocedural states: Secondary | ICD-10-CM | POA: Diagnosis not present

## 2023-10-17 DIAGNOSIS — E119 Type 2 diabetes mellitus without complications: Secondary | ICD-10-CM | POA: Diagnosis not present

## 2023-10-17 DIAGNOSIS — Z48811 Encounter for surgical aftercare following surgery on the nervous system: Secondary | ICD-10-CM | POA: Diagnosis not present

## 2023-10-17 DIAGNOSIS — Q019 Encephalocele, unspecified: Secondary | ICD-10-CM | POA: Diagnosis not present

## 2023-10-17 DIAGNOSIS — E039 Hypothyroidism, unspecified: Secondary | ICD-10-CM | POA: Diagnosis not present

## 2023-10-17 DIAGNOSIS — E785 Hyperlipidemia, unspecified: Secondary | ICD-10-CM | POA: Diagnosis not present

## 2023-10-17 DIAGNOSIS — A4901 Methicillin susceptible Staphylococcus aureus infection, unspecified site: Secondary | ICD-10-CM | POA: Diagnosis not present

## 2023-10-17 DIAGNOSIS — I7121 Aneurysm of the ascending aorta, without rupture: Secondary | ICD-10-CM | POA: Diagnosis not present

## 2023-10-17 DIAGNOSIS — M6281 Muscle weakness (generalized): Secondary | ICD-10-CM | POA: Diagnosis not present

## 2023-10-17 DIAGNOSIS — I110 Hypertensive heart disease with heart failure: Secondary | ICD-10-CM | POA: Diagnosis not present

## 2023-10-17 DIAGNOSIS — R5381 Other malaise: Secondary | ICD-10-CM | POA: Diagnosis not present

## 2023-10-17 DIAGNOSIS — B9561 Methicillin susceptible Staphylococcus aureus infection as the cause of diseases classified elsewhere: Secondary | ICD-10-CM | POA: Diagnosis not present

## 2023-10-17 DIAGNOSIS — R569 Unspecified convulsions: Secondary | ICD-10-CM | POA: Diagnosis not present

## 2023-10-17 DIAGNOSIS — R2689 Other abnormalities of gait and mobility: Secondary | ICD-10-CM | POA: Diagnosis not present

## 2023-10-17 DIAGNOSIS — I5032 Chronic diastolic (congestive) heart failure: Secondary | ICD-10-CM | POA: Diagnosis not present

## 2023-10-17 DIAGNOSIS — G9601 Cranial cerebrospinal fluid leak, spontaneous: Secondary | ICD-10-CM | POA: Diagnosis not present

## 2023-10-17 DIAGNOSIS — D509 Iron deficiency anemia, unspecified: Secondary | ICD-10-CM | POA: Diagnosis not present

## 2023-10-17 DIAGNOSIS — I10 Essential (primary) hypertension: Secondary | ICD-10-CM | POA: Diagnosis not present

## 2023-10-17 DIAGNOSIS — R262 Difficulty in walking, not elsewhere classified: Secondary | ICD-10-CM | POA: Diagnosis not present

## 2023-10-17 DIAGNOSIS — G936 Cerebral edema: Secondary | ICD-10-CM | POA: Diagnosis not present

## 2023-10-20 DIAGNOSIS — R569 Unspecified convulsions: Secondary | ICD-10-CM | POA: Diagnosis not present

## 2023-10-20 DIAGNOSIS — I10 Essential (primary) hypertension: Secondary | ICD-10-CM | POA: Diagnosis not present

## 2023-10-20 DIAGNOSIS — R5381 Other malaise: Secondary | ICD-10-CM | POA: Diagnosis not present

## 2023-10-20 DIAGNOSIS — I509 Heart failure, unspecified: Secondary | ICD-10-CM | POA: Diagnosis not present

## 2023-10-20 DIAGNOSIS — E785 Hyperlipidemia, unspecified: Secondary | ICD-10-CM | POA: Diagnosis not present

## 2023-10-20 DIAGNOSIS — Z9889 Other specified postprocedural states: Secondary | ICD-10-CM | POA: Diagnosis not present

## 2023-10-21 ENCOUNTER — Telehealth: Payer: Medicare Other | Admitting: Family Medicine

## 2023-10-21 DIAGNOSIS — R569 Unspecified convulsions: Secondary | ICD-10-CM | POA: Diagnosis not present

## 2023-10-21 DIAGNOSIS — E039 Hypothyroidism, unspecified: Secondary | ICD-10-CM | POA: Diagnosis not present

## 2023-10-21 DIAGNOSIS — R5381 Other malaise: Secondary | ICD-10-CM | POA: Diagnosis not present

## 2023-10-21 DIAGNOSIS — D509 Iron deficiency anemia, unspecified: Secondary | ICD-10-CM | POA: Diagnosis not present

## 2023-10-21 DIAGNOSIS — Z9889 Other specified postprocedural states: Secondary | ICD-10-CM | POA: Diagnosis not present

## 2023-10-21 DIAGNOSIS — G060 Intracranial abscess and granuloma: Secondary | ICD-10-CM | POA: Diagnosis not present

## 2023-10-21 DIAGNOSIS — Q019 Encephalocele, unspecified: Secondary | ICD-10-CM | POA: Diagnosis not present

## 2023-10-22 DIAGNOSIS — R5381 Other malaise: Secondary | ICD-10-CM | POA: Diagnosis not present

## 2023-10-22 DIAGNOSIS — Z9889 Other specified postprocedural states: Secondary | ICD-10-CM | POA: Diagnosis not present

## 2023-10-22 DIAGNOSIS — A4901 Methicillin susceptible Staphylococcus aureus infection, unspecified site: Secondary | ICD-10-CM | POA: Diagnosis not present

## 2023-10-22 DIAGNOSIS — R569 Unspecified convulsions: Secondary | ICD-10-CM | POA: Diagnosis not present

## 2023-10-22 DIAGNOSIS — E785 Hyperlipidemia, unspecified: Secondary | ICD-10-CM | POA: Diagnosis not present

## 2023-10-22 DIAGNOSIS — I509 Heart failure, unspecified: Secondary | ICD-10-CM | POA: Diagnosis not present

## 2023-10-24 DIAGNOSIS — R2689 Other abnormalities of gait and mobility: Secondary | ICD-10-CM | POA: Diagnosis not present

## 2023-10-24 DIAGNOSIS — M6281 Muscle weakness (generalized): Secondary | ICD-10-CM | POA: Diagnosis not present

## 2023-10-26 DIAGNOSIS — M6281 Muscle weakness (generalized): Secondary | ICD-10-CM | POA: Diagnosis not present

## 2023-10-26 DIAGNOSIS — R278 Other lack of coordination: Secondary | ICD-10-CM | POA: Diagnosis not present

## 2023-10-27 ENCOUNTER — Telehealth: Payer: Self-pay

## 2023-10-27 NOTE — Telephone Encounter (Signed)
 Prolia  VOB initiated via [email or web address]  Next Prolia  inj DUE: 11/27/23

## 2023-10-28 ENCOUNTER — Other Ambulatory Visit (HOSPITAL_COMMUNITY): Payer: Self-pay

## 2023-10-28 DIAGNOSIS — Z48811 Encounter for surgical aftercare following surgery on the nervous system: Secondary | ICD-10-CM | POA: Diagnosis not present

## 2023-10-28 DIAGNOSIS — B9561 Methicillin susceptible Staphylococcus aureus infection as the cause of diseases classified elsewhere: Secondary | ICD-10-CM | POA: Diagnosis not present

## 2023-10-28 DIAGNOSIS — R2689 Other abnormalities of gait and mobility: Secondary | ICD-10-CM | POA: Diagnosis not present

## 2023-10-28 DIAGNOSIS — G060 Intracranial abscess and granuloma: Secondary | ICD-10-CM | POA: Diagnosis not present

## 2023-10-28 DIAGNOSIS — M6281 Muscle weakness (generalized): Secondary | ICD-10-CM | POA: Diagnosis not present

## 2023-10-28 NOTE — Telephone Encounter (Signed)
 [name]

## 2023-10-28 NOTE — Telephone Encounter (Signed)
 Pt ready for scheduling for PROLIA  on or after : 11/27/23  Option# 1: Buy/Bill (Office supplied medication)  Out-of-pocket cost due at time of clinic visit: $352  Number of injection/visits approved: 2  Primary: UHC-MEDICARE Prolia  co-insurance: 20% Admin fee co-insurance: $20  Secondary: --- Prolia  co-insurance:  Admin fee co-insurance:   Medical Benefit Details: Date Benefits were checked: 10/27/23 Deductible: NO/ Coinsurance: 20%/ Admin Fee: $20  Prior Auth: APPROVED PA# [id number] Expiration Date: 04/24/2023 to 04/23/2024  # of doses approved: 2 ----------------------------------------------------------------------- Option# 2- Med Obtained from pharmacy:  Pharmacy benefit: Copay $300 (Paid to pharmacy) Admin Fee: $20 (Pay at clinic)  Prior Auth: N/A PA# Expiration Date:   # of doses approved:   If patient wants fill through the pharmacy benefit please send prescription to: [facility], and include estimated need by date in rx notes. Pharmacy will ship medication directly to the office.  Patient NOT eligible for Prolia  Copay Card. Copay Card can make patient's cost as little as $25. Link to apply: [email or web address]://[email or web address]  ** This summary of benefits is an estimation of the patient's out-of-pocket cost. Exact cost may very based on individual plan coverage.

## 2023-10-29 DIAGNOSIS — M6281 Muscle weakness (generalized): Secondary | ICD-10-CM | POA: Diagnosis not present

## 2023-10-29 DIAGNOSIS — R278 Other lack of coordination: Secondary | ICD-10-CM | POA: Diagnosis not present

## 2023-10-31 ENCOUNTER — Other Ambulatory Visit: Payer: Self-pay

## 2023-10-31 ENCOUNTER — Other Ambulatory Visit (HOSPITAL_COMMUNITY): Payer: Self-pay

## 2023-10-31 DIAGNOSIS — M6281 Muscle weakness (generalized): Secondary | ICD-10-CM | POA: Diagnosis not present

## 2023-10-31 DIAGNOSIS — R278 Other lack of coordination: Secondary | ICD-10-CM | POA: Diagnosis not present

## 2023-11-03 DIAGNOSIS — Z452 Encounter for adjustment and management of vascular access device: Secondary | ICD-10-CM | POA: Diagnosis not present

## 2023-11-03 DIAGNOSIS — G060 Intracranial abscess and granuloma: Secondary | ICD-10-CM | POA: Diagnosis not present

## 2023-11-03 DIAGNOSIS — A4901 Methicillin susceptible Staphylococcus aureus infection, unspecified site: Secondary | ICD-10-CM | POA: Diagnosis not present

## 2023-11-03 DIAGNOSIS — Z792 Long term (current) use of antibiotics: Secondary | ICD-10-CM | POA: Diagnosis not present

## 2023-11-04 ENCOUNTER — Other Ambulatory Visit (HOSPITAL_COMMUNITY): Payer: Self-pay

## 2023-11-04 ENCOUNTER — Other Ambulatory Visit: Payer: Self-pay | Admitting: Family Medicine

## 2023-11-04 ENCOUNTER — Other Ambulatory Visit: Payer: Self-pay

## 2023-11-04 DIAGNOSIS — M6281 Muscle weakness (generalized): Secondary | ICD-10-CM | POA: Diagnosis not present

## 2023-11-04 DIAGNOSIS — R278 Other lack of coordination: Secondary | ICD-10-CM | POA: Diagnosis not present

## 2023-11-04 DIAGNOSIS — R2689 Other abnormalities of gait and mobility: Secondary | ICD-10-CM | POA: Diagnosis not present

## 2023-11-05 ENCOUNTER — Other Ambulatory Visit (HOSPITAL_COMMUNITY): Payer: Self-pay

## 2023-11-05 DIAGNOSIS — M6281 Muscle weakness (generalized): Secondary | ICD-10-CM | POA: Diagnosis not present

## 2023-11-05 DIAGNOSIS — R2689 Other abnormalities of gait and mobility: Secondary | ICD-10-CM | POA: Diagnosis not present

## 2023-11-06 DIAGNOSIS — R9401 Abnormal electroencephalogram [EEG]: Secondary | ICD-10-CM | POA: Diagnosis not present

## 2023-11-06 DIAGNOSIS — R569 Unspecified convulsions: Secondary | ICD-10-CM | POA: Diagnosis not present

## 2023-11-07 DIAGNOSIS — Z48811 Encounter for surgical aftercare following surgery on the nervous system: Secondary | ICD-10-CM | POA: Diagnosis not present

## 2023-11-07 DIAGNOSIS — G060 Intracranial abscess and granuloma: Secondary | ICD-10-CM | POA: Diagnosis not present

## 2023-11-10 ENCOUNTER — Other Ambulatory Visit: Payer: Self-pay

## 2023-11-10 DIAGNOSIS — G060 Intracranial abscess and granuloma: Secondary | ICD-10-CM | POA: Diagnosis not present

## 2023-11-10 DIAGNOSIS — Z48811 Encounter for surgical aftercare following surgery on the nervous system: Secondary | ICD-10-CM | POA: Diagnosis not present

## 2023-11-11 ENCOUNTER — Telehealth: Payer: Self-pay | Admitting: *Deleted

## 2023-11-11 DIAGNOSIS — M6281 Muscle weakness (generalized): Secondary | ICD-10-CM | POA: Diagnosis not present

## 2023-11-11 DIAGNOSIS — R2689 Other abnormalities of gait and mobility: Secondary | ICD-10-CM | POA: Diagnosis not present

## 2023-11-11 NOTE — Telephone Encounter (Signed)
 Left message on machine to call back.  Would like to check status and see if she would like to get Prolia  injection.  She is due on or after 11/28/23.  Cost is cheaper through pharmacy at $300.  Pharmacy will send to us  and advise pt to look out call from pharmacy.

## 2023-11-12 ENCOUNTER — Other Ambulatory Visit: Payer: Self-pay | Admitting: Family Medicine

## 2023-11-12 ENCOUNTER — Encounter: Payer: Self-pay | Admitting: Family Medicine

## 2023-11-12 DIAGNOSIS — K219 Gastro-esophageal reflux disease without esophagitis: Secondary | ICD-10-CM

## 2023-11-12 DIAGNOSIS — R059 Cough, unspecified: Secondary | ICD-10-CM

## 2023-11-12 DIAGNOSIS — J300 Vasomotor rhinitis: Secondary | ICD-10-CM

## 2023-11-12 DIAGNOSIS — J351 Hypertrophy of tonsils: Secondary | ICD-10-CM

## 2023-11-12 DIAGNOSIS — Z48811 Encounter for surgical aftercare following surgery on the nervous system: Secondary | ICD-10-CM | POA: Diagnosis not present

## 2023-11-12 DIAGNOSIS — R278 Other lack of coordination: Secondary | ICD-10-CM | POA: Diagnosis not present

## 2023-11-12 DIAGNOSIS — G060 Intracranial abscess and granuloma: Secondary | ICD-10-CM

## 2023-11-12 DIAGNOSIS — M6281 Muscle weakness (generalized): Secondary | ICD-10-CM | POA: Diagnosis not present

## 2023-11-12 DIAGNOSIS — J309 Allergic rhinitis, unspecified: Secondary | ICD-10-CM

## 2023-11-12 DIAGNOSIS — G629 Polyneuropathy, unspecified: Secondary | ICD-10-CM

## 2023-11-12 DIAGNOSIS — J302 Other seasonal allergic rhinitis: Secondary | ICD-10-CM

## 2023-11-12 DIAGNOSIS — R2689 Other abnormalities of gait and mobility: Secondary | ICD-10-CM | POA: Diagnosis not present

## 2023-11-13 ENCOUNTER — Telehealth: Payer: Self-pay

## 2023-11-13 ENCOUNTER — Other Ambulatory Visit (HOSPITAL_COMMUNITY): Payer: Self-pay

## 2023-11-13 ENCOUNTER — Other Ambulatory Visit: Payer: Self-pay

## 2023-11-13 DIAGNOSIS — R278 Other lack of coordination: Secondary | ICD-10-CM | POA: Diagnosis not present

## 2023-11-13 DIAGNOSIS — M6281 Muscle weakness (generalized): Secondary | ICD-10-CM | POA: Diagnosis not present

## 2023-11-13 NOTE — Telephone Encounter (Signed)
 Copied from CRM [id number]. Topic: Clinical - Medication Question >> Nov 12, 2023  3:30 PM Earnestine Goes B wrote: Reason for CRM: pt called to speak with dr. Rodrick Clapper. Regarding medication . Please call pt back at [phone]  Called patient and no answer left vm to return call.

## 2023-11-17 DIAGNOSIS — Z48811 Encounter for surgical aftercare following surgery on the nervous system: Secondary | ICD-10-CM | POA: Diagnosis not present

## 2023-11-17 DIAGNOSIS — G060 Intracranial abscess and granuloma: Secondary | ICD-10-CM | POA: Diagnosis not present

## 2023-11-18 DIAGNOSIS — R2689 Other abnormalities of gait and mobility: Secondary | ICD-10-CM | POA: Diagnosis not present

## 2023-11-18 DIAGNOSIS — M6281 Muscle weakness (generalized): Secondary | ICD-10-CM | POA: Diagnosis not present

## 2023-11-18 NOTE — Telephone Encounter (Signed)
 Left message on machine to call back

## 2023-11-19 DIAGNOSIS — G060 Intracranial abscess and granuloma: Secondary | ICD-10-CM | POA: Diagnosis not present

## 2023-11-19 DIAGNOSIS — Z48811 Encounter for surgical aftercare following surgery on the nervous system: Secondary | ICD-10-CM | POA: Diagnosis not present

## 2023-11-19 DIAGNOSIS — M6281 Muscle weakness (generalized): Secondary | ICD-10-CM | POA: Diagnosis not present

## 2023-11-19 DIAGNOSIS — R2689 Other abnormalities of gait and mobility: Secondary | ICD-10-CM | POA: Diagnosis not present

## 2023-11-20 DIAGNOSIS — M6281 Muscle weakness (generalized): Secondary | ICD-10-CM | POA: Diagnosis not present

## 2023-11-20 DIAGNOSIS — R278 Other lack of coordination: Secondary | ICD-10-CM | POA: Diagnosis not present

## 2023-11-21 DIAGNOSIS — R278 Other lack of coordination: Secondary | ICD-10-CM | POA: Diagnosis not present

## 2023-11-21 DIAGNOSIS — M6281 Muscle weakness (generalized): Secondary | ICD-10-CM | POA: Diagnosis not present

## 2023-11-21 NOTE — Telephone Encounter (Signed)
 Left message on machine to call back

## 2023-11-25 ENCOUNTER — Telehealth: Admitting: Family Medicine

## 2023-11-25 DIAGNOSIS — R2689 Other abnormalities of gait and mobility: Secondary | ICD-10-CM | POA: Diagnosis not present

## 2023-11-25 DIAGNOSIS — R278 Other lack of coordination: Secondary | ICD-10-CM | POA: Diagnosis not present

## 2023-11-25 DIAGNOSIS — M6281 Muscle weakness (generalized): Secondary | ICD-10-CM | POA: Diagnosis not present

## 2023-11-25 NOTE — Telephone Encounter (Signed)
 Left message on machine to call back and unable to reach letter sent out.

## 2023-11-26 ENCOUNTER — Encounter (INDEPENDENT_AMBULATORY_CARE_PROVIDER_SITE_OTHER): Payer: Self-pay | Admitting: Otolaryngology

## 2023-11-27 DIAGNOSIS — M6281 Muscle weakness (generalized): Secondary | ICD-10-CM | POA: Diagnosis not present

## 2023-11-27 DIAGNOSIS — G060 Intracranial abscess and granuloma: Secondary | ICD-10-CM | POA: Diagnosis not present

## 2023-11-27 DIAGNOSIS — R2689 Other abnormalities of gait and mobility: Secondary | ICD-10-CM | POA: Diagnosis not present

## 2023-11-27 DIAGNOSIS — Z48811 Encounter for surgical aftercare following surgery on the nervous system: Secondary | ICD-10-CM | POA: Diagnosis not present

## 2023-11-28 DIAGNOSIS — R278 Other lack of coordination: Secondary | ICD-10-CM | POA: Diagnosis not present

## 2023-11-28 DIAGNOSIS — M6281 Muscle weakness (generalized): Secondary | ICD-10-CM | POA: Diagnosis not present

## 2023-12-01 DIAGNOSIS — M6281 Muscle weakness (generalized): Secondary | ICD-10-CM | POA: Diagnosis not present

## 2023-12-01 DIAGNOSIS — R2689 Other abnormalities of gait and mobility: Secondary | ICD-10-CM | POA: Diagnosis not present

## 2023-12-02 DIAGNOSIS — R278 Other lack of coordination: Secondary | ICD-10-CM | POA: Diagnosis not present

## 2023-12-02 DIAGNOSIS — G060 Intracranial abscess and granuloma: Secondary | ICD-10-CM | POA: Diagnosis not present

## 2023-12-02 DIAGNOSIS — M6281 Muscle weakness (generalized): Secondary | ICD-10-CM | POA: Diagnosis not present

## 2023-12-02 DIAGNOSIS — Z48811 Encounter for surgical aftercare following surgery on the nervous system: Secondary | ICD-10-CM | POA: Diagnosis not present

## 2023-12-03 DIAGNOSIS — M6281 Muscle weakness (generalized): Secondary | ICD-10-CM | POA: Diagnosis not present

## 2023-12-03 DIAGNOSIS — G060 Intracranial abscess and granuloma: Secondary | ICD-10-CM | POA: Diagnosis not present

## 2023-12-03 DIAGNOSIS — R2689 Other abnormalities of gait and mobility: Secondary | ICD-10-CM | POA: Diagnosis not present

## 2023-12-03 DIAGNOSIS — Z48811 Encounter for surgical aftercare following surgery on the nervous system: Secondary | ICD-10-CM | POA: Diagnosis not present

## 2023-12-04 DIAGNOSIS — R278 Other lack of coordination: Secondary | ICD-10-CM | POA: Diagnosis not present

## 2023-12-04 DIAGNOSIS — M6281 Muscle weakness (generalized): Secondary | ICD-10-CM | POA: Diagnosis not present

## 2023-12-08 DIAGNOSIS — M6281 Muscle weakness (generalized): Secondary | ICD-10-CM | POA: Diagnosis not present

## 2023-12-08 DIAGNOSIS — R2689 Other abnormalities of gait and mobility: Secondary | ICD-10-CM | POA: Diagnosis not present

## 2023-12-09 DIAGNOSIS — M6281 Muscle weakness (generalized): Secondary | ICD-10-CM | POA: Diagnosis not present

## 2023-12-09 DIAGNOSIS — R278 Other lack of coordination: Secondary | ICD-10-CM | POA: Diagnosis not present

## 2023-12-10 DIAGNOSIS — R2689 Other abnormalities of gait and mobility: Secondary | ICD-10-CM | POA: Diagnosis not present

## 2023-12-10 DIAGNOSIS — M6281 Muscle weakness (generalized): Secondary | ICD-10-CM | POA: Diagnosis not present

## 2023-12-11 DIAGNOSIS — R278 Other lack of coordination: Secondary | ICD-10-CM | POA: Diagnosis not present

## 2023-12-11 DIAGNOSIS — M6281 Muscle weakness (generalized): Secondary | ICD-10-CM | POA: Diagnosis not present

## 2023-12-12 ENCOUNTER — Other Ambulatory Visit (HOSPITAL_COMMUNITY): Payer: Self-pay

## 2023-12-15 DIAGNOSIS — R2689 Other abnormalities of gait and mobility: Secondary | ICD-10-CM | POA: Diagnosis not present

## 2023-12-15 DIAGNOSIS — M6281 Muscle weakness (generalized): Secondary | ICD-10-CM | POA: Diagnosis not present

## 2023-12-16 ENCOUNTER — Other Ambulatory Visit: Payer: Self-pay

## 2023-12-16 ENCOUNTER — Other Ambulatory Visit (HOSPITAL_COMMUNITY): Payer: Self-pay

## 2023-12-16 NOTE — Progress Notes (Addendum)
 Per 5/13 refill encounter Jerona, Edison BIRCH, CMA), "Patient was recently in hospital and has a lot going on. We will send this in when she is able to come out. I will be following up with her on Monday." Per 5/22 Telephone encounter, patient called [facility] to speak with Dr.Blyth, possibly about Prolia . Office left 3 voicemails to patient. Specialty Services Program has been paused, pending update from [facility].

## 2023-12-17 DIAGNOSIS — M6281 Muscle weakness (generalized): Secondary | ICD-10-CM | POA: Diagnosis not present

## 2023-12-17 DIAGNOSIS — R2689 Other abnormalities of gait and mobility: Secondary | ICD-10-CM | POA: Diagnosis not present

## 2023-12-19 ENCOUNTER — Telehealth: Payer: Self-pay

## 2023-12-19 NOTE — Telephone Encounter (Signed)
 I called and left a detailed message letting the patient know she has 2 bags of medication that is containing Tresiba  and Ozempic .   Total: 8 ozempic  1 mg, and 4 boxes of tresiba 

## 2023-12-25 ENCOUNTER — Encounter: Payer: Self-pay | Admitting: Family Medicine

## 2023-12-27 ENCOUNTER — Telehealth: Admitting: Nurse Practitioner

## 2023-12-27 DIAGNOSIS — R399 Unspecified symptoms and signs involving the genitourinary system: Secondary | ICD-10-CM | POA: Diagnosis not present

## 2023-12-27 MED ORDER — CEPHALEXIN 500 MG PO CAPS: 500.0000 mg | ORAL_CAPSULE | Freq: Two times a day (BID) | ORAL | 0 refills | Status: AC

## 2023-12-27 NOTE — Progress Notes (Signed)

## 2024-01-08 ENCOUNTER — Telehealth (INDEPENDENT_AMBULATORY_CARE_PROVIDER_SITE_OTHER): Admitting: Family Medicine

## 2024-01-08 ENCOUNTER — Other Ambulatory Visit: Payer: Self-pay

## 2024-01-08 ENCOUNTER — Other Ambulatory Visit: Payer: Self-pay | Admitting: Family Medicine

## 2024-01-08 DIAGNOSIS — E1165 Type 2 diabetes mellitus with hyperglycemia: Secondary | ICD-10-CM

## 2024-01-08 DIAGNOSIS — E559 Vitamin D deficiency, unspecified: Secondary | ICD-10-CM

## 2024-01-08 DIAGNOSIS — E7800 Pure hypercholesterolemia, unspecified: Secondary | ICD-10-CM

## 2024-01-08 DIAGNOSIS — G060 Intracranial abscess and granuloma: Secondary | ICD-10-CM

## 2024-01-08 DIAGNOSIS — R2681 Unsteadiness on feet: Secondary | ICD-10-CM

## 2024-01-08 DIAGNOSIS — I10 Essential (primary) hypertension: Secondary | ICD-10-CM

## 2024-01-08 DIAGNOSIS — E039 Hypothyroidism, unspecified: Secondary | ICD-10-CM

## 2024-01-08 DIAGNOSIS — E890 Postprocedural hypothyroidism: Secondary | ICD-10-CM

## 2024-01-08 MED ORDER — CETIRIZINE HCL 10 MG PO TABS: 10.0000 mg | ORAL_TABLET | Freq: Every day | ORAL | Status: DC

## 2024-01-08 MED ORDER — ESCITALOPRAM OXALATE 20 MG PO TABS: ORAL_TABLET | ORAL | Status: DC

## 2024-01-08 MED ORDER — ONDANSETRON 4 MG PO TBDP: 4.0000 mg | ORAL_TABLET | Freq: Two times a day (BID) | ORAL | Status: AC | PRN

## 2024-01-08 MED ORDER — INSULIN GLARGINE 100 UNIT/ML SOLOSTAR PEN: 10.0000 [IU] | PEN_INJECTOR | Freq: Every day | SUBCUTANEOUS | Status: AC

## 2024-01-08 MED ORDER — HYDRALAZINE HCL 10 MG PO TABS
10.0000 mg | ORAL_TABLET | Freq: Three times a day (TID) | ORAL | Status: DC
Start: 2024-01-08 — End: 2024-01-13

## 2024-01-08 MED ORDER — POTASSIUM CHLORIDE CRYS ER 20 MEQ PO TBCR: 20.0000 meq | EXTENDED_RELEASE_TABLET | Freq: Two times a day (BID) | ORAL | Status: DC

## 2024-01-08 MED ORDER — METFORMIN HCL ER 500 MG PO TB24: 1000.0000 mg | ORAL_TABLET | Freq: Two times a day (BID) | ORAL | Status: DC

## 2024-01-08 MED ORDER — SPIRONOLACTONE 25 MG PO TABS: 25.0000 mg | ORAL_TABLET | Freq: Every day | ORAL | Status: DC

## 2024-01-08 MED ORDER — BUSPIRONE HCL 7.5 MG PO TABS: 7.5000 mg | ORAL_TABLET | Freq: Two times a day (BID) | ORAL | Status: DC | PRN

## 2024-01-08 MED ORDER — LEVETIRACETAM 1000 MG PO TABS
1000.0000 mg | ORAL_TABLET | Freq: Two times a day (BID) | ORAL | Status: DC
Start: 2024-01-08 — End: 2024-02-02

## 2024-01-08 MED ORDER — MECLIZINE HCL 25 MG PO TABS: 25.0000 mg | ORAL_TABLET | Freq: Three times a day (TID) | ORAL | 1 refills | Status: AC | PRN

## 2024-01-11 ENCOUNTER — Encounter: Payer: Self-pay | Admitting: Family Medicine

## 2024-01-11 NOTE — Progress Notes (Signed)
 MyChart Video Visit    Virtual Visit via Video Note   This patient is at least at moderate risk for complications without adequate follow up. This format is felt to be most appropriate for this patient at this time. Physical exam was limited by quality of the video and audio technology used for the visit. [name] was able to get the patient set up on a video visit.  Patient location: home Patient, her daughter and provider in visit Provider location: Office  I discussed the limitations of evaluation and management by telemedicine and the availability of in person appointments. The patient expressed understanding and agreed to proceed.  Visit Date: 01/08/2024  Today's healthcare provider: Harlene Horton, MD     Subjective:    [name], female    DOB: May 11, 1951, 73 y.o.   MRN: [id number]  Chief Complaint  Patient presents with   Acute Visit    Patient presents today for medication review.    HPI Discussed the use of AI scribe software for clinical note transcription with the patient, who gave verbal consent to proceed.  History of Present Illness [name] Shevlin is a 73 year old female who presents for medication review and follow-up after brain surgery. She is accompanied by her daughter.  She underwent brain surgery for a brain abscess at [facility] and completed a six-week course of antibiotics post-surgery. She has not had a follow-up appointment since the surgery but is scheduled for a neurology consultation on July 25th to discuss EEG results. She was told by her care team that the EEG showed some reduction in brainwave activity in the left and right temporal lobes, but she is awaiting further discussion with neurology.  She has a history of a cerebrospinal fluid leak that was patched. She has not experienced any seizure activity since being on Keppra , which she is taking at 1000 mg twice daily until her next neurology appointment.  Her  medication regimen has changed multiple times across different facilities. Currently, she is taking cetirizine  10 mg daily, Lexapro  with a new dosing schedule of one tablet in the morning and two at night, hydralazine  10 mg three times a day, metformin  split into two doses, and Lantus  Solostar 4 units in the evening. She is also on spironolactone  25 mg daily, vitamin D  1000 IU daily, potassium chloride  20 mEq daily, and buspirone  7.5 mg twice daily as needed. She is not taking fenofibrate  or Fosamax  recently, and her use of Ozempic  was discontinued in March. She is currently taking levothyroxine  150 mcg and 200 mcg, rosuvastatin , montelukast , carvedilol , and ezetimibe . She is not taking aspirin  due to previous severe diarrhea.  She has experienced vertigo and had a fall last week, which she associates with motion sickness. She has a history of motion sickness and dehydration may be contributing to her symptoms. She has previously used meclizine  for vertigo.  She had a urinary tract infection recently, which was treated with medication, and experienced incontinence during this time.  Her past medical history includes cancer, for which she was taking anastrozole  until recently, and she is due for a mammogram. She has been exploring assisted living options with her family but prefers to stay at home. She is considering reducing her care hours but is cautious due to her recent health issues.    Past Medical History:  Diagnosis Date   Acquired dilation of ascending aorta and aortic root (HCC)    Ascending arctic 43 mm and aortic  root 40 mm by chest CTA 09/2022   Anemia 04/05/2012   Anxiety    Anxiety state 09/11/2007   Qualifier: Diagnosis of  By: Krystal RN, Leeroy     Asthma    Atrial tachycardia, paroxysmal (HCC)    Back pain 10/02/2014   Breast cancer (HCC) 02/17/2017   CAD (coronary artery disease), native coronary artery    remote Cath with nonobstructive ASCAD with 20% mid LAD, 20% prox left  circ and 20% ostial RCA   Chicken pox as a child   Chronic diastolic CHF (congestive heart failure), NYHA class 1 (HCC)    Complication of anesthesia    pt states feels different in her body after anesthesia when waking up and also experiences a smell of burnt plastic for approx a wk    Constipation    Depression    Diabetes (HCC)    Dilated aortic root (HCC)    42mm by echo 08/2020   Dysuria 02/17/2017   Elevated LFTs 04/05/2012   GERD (gastroesophageal reflux disease)    Goiter    Heart murmur    hx of one at birth    History of kidney stones    History of radiation therapy 10/31/16-12/17/16   left breast 45 Gy in 25 fractions, left breast boost 16 Gy in 8 fractions   Hx of colonic polyps    Hyperlipidemia    Hypertension    Insomnia 10/08/2016   Joint pain    Malignant neoplasm of upper-outer quadrant of left female breast (HCC) 05/14/2016   not with patient   Measles as a child   Mumps as a child   OA (osteoarthritis) of knee 04/05/2012   Obesity 07/11/2014   PONV (postoperative nausea and vomiting)    Preventative health care 09/05/2013   PVC's (premature ventricular contractions) 05/02/2016   Shortness of breath dyspnea    walking distances / climbing stairs   Sleep apnea 04/05/2012   Tinea corporis 02/23/2013   Type 2 diabetes mellitus with hyperglycemia (HCC) 12/31/2013   Vasomotor rhinitis 04/05/2012   Ventral hernia    Wheezing     Past Surgical History:  Procedure Laterality Date   BREAST LUMPECTOMY Left 2017   BREAST LUMPECTOMY WITH RADIOACTIVE SEED AND SENTINEL LYMPH NODE BIOPSY Left 06/21/2016   Procedure: LEFT BREAST LUMPECTOMY WITH RADIOACTIVE SEED AND SENTINEL LYMPH NODE BIOPSY, INJECT BLUE DYE LEFT BREAST;  Surgeon: Elon Pacini, MD;  Location: [facility] OR;  Service: General;  Laterality: Left;   CARDIAC CATHETERIZATION     normal coroary arteries per patient   CARDIOVASCULAR STRESS TEST     10/12/2013   CESAREAN SECTION     X 3   CHOLECYSTECTOMY      COLONOSCOPY WITH PROPOFOL  N/A 03/28/2015   Procedure: COLONOSCOPY WITH PROPOFOL ;  Surgeon: Renaye Sous, MD;  Location: [facility] ENDOSCOPY;  Service: Endoscopy;  Laterality: N/A;   HERNIA REPAIR  02/08/2011   ventral hernia   JOINT REPLACEMENT     bilateral   KNEE ARTHROSCOPY  05/2010   bilateral   LEFT AND RIGHT HEART CATHETERIZATION WITH CORONARY ANGIOGRAM N/A 11/08/2013   Procedure: LEFT AND RIGHT HEART CATHETERIZATION WITH CORONARY ANGIOGRAM;  Surgeon: Lonni JONETTA Cash, MD;  Location: [facility] CATH LAB;  Service: Cardiovascular;  Laterality: N/A;   MENISCUS REPAIR  2009   MOUTH SURGERY     teeth implants   PORT-A-CATH REMOVAL N/A 01/24/2017   Procedure: REMOVAL PORT-A-CATH;  Surgeon: Pacini Elon, MD;  Location: [facility] OR;  Service: General;  Laterality: N/A;   PORTACATH PLACEMENT Right 07/16/2016   Procedure: INSERTION PORT-A-CATH RIGHT INTERNAL JUGULAR WITH ULTRASOUND;  Surgeon: Elon Pacini, MD;  Location: [facility] OR;  Service: General;  Laterality: Right;   REPAIR DURAL / CSF LEAK  2021   performed at [facility] in [place]   TOTAL KNEE ARTHROPLASTY  2011   left   TOTAL KNEE ARTHROPLASTY Right 05/10/2014   Procedure: RIGHT TOTAL KNEE ARTHROPLASTY;  Surgeon: Donnice JONETTA Car, MD;  Location: [facility] ORS;  Service: Orthopedics;  Laterality: Right;   TOTAL THYROIDECTOMY Bilateral 2022   TUBAL LIGATION     WISDOM TOOTH EXTRACTION  2000    Family History  Problem Relation Age of Onset   Thyroid  disease Mother        hypothyroid   Hyperlipidemia Mother    Depression Mother    Anxiety disorder Mother    Heart attack Father 98   Hypertension Father    Arthritis Father        RA   Coronary artery disease Father    High Cholesterol Father    Coronary artery disease Brother    Heart disease Brother    Cancer Maternal Grandmother        colon   Heart attack Maternal Grandfather    Alcohol abuse Paternal Grandfather    Cancer Maternal Aunt        colon    Social History    Socioeconomic History   Marital status: Divorced    Spouse name: Not on file   Number of children: 3   Years of education: Not on file   Highest education level: 12th grade  Occupational History   Occupation: retired - [place]  Tobacco Use   Smoking status: Never   Smokeless tobacco: Never  Vaping Use   Vaping status: Never Used  Substance and Sexual Activity   Alcohol use: Not Currently    Comment: rarely   Drug use: No   Sexual activity: Never  Other Topics Concern   Not on file  Social History Narrative   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)   Social Drivers of Health   Financial Resource Strain: Low Risk  (01/07/2024)   Overall Financial Resource Strain (CARDIA)    Difficulty of Paying Living Expenses: Not hard at all  Food Insecurity: No Food Insecurity (01/07/2024)   Hunger Vital Sign    Worried About Running Out of Food in the Last Year: Never true    Ran Out of Food in the Last Year: Never true  Transportation Needs: No Transportation Needs (01/07/2024)   PRAPARE - Administrator, Civil Service (Medical): No    Lack of Transportation (Non-Medical): No  Physical Activity: Inactive (01/07/2024)   Exercise Vital Sign    Days of Exercise per Week: 0 days    Minutes of Exercise per Session: Not on file  Stress: Stress Concern Present (01/07/2024)   [employer] of Occupational Health - Occupational Stress Questionnaire    Feeling of Stress: To some extent  Social Connections: Moderately Integrated (01/07/2024)   Social Connection and Isolation Panel    Frequency of Communication with Friends and Family: More than three times a week    Frequency of Social Gatherings with Friends and Family: More than three times a week    Attends Religious Services: More than 4 times per year    Active Member of [employer] or Organizations: Yes    Attends Banker Meetings:  More than 4 times per year    Marital Status: Divorced  Intimate  Partner Violence: Not At Risk (08/19/2023)   Humiliation, Afraid, Rape, and Kick questionnaire    Fear of Current or Ex-Partner: No    Emotionally Abused: No    Physically Abused: No    Sexually Abused: No    Outpatient Medications Prior to Visit  Medication Sig Dispense Refill   isosorbide  dinitrate (ISORDIL ) 10 MG tablet Take 10 mg by mouth 3 (three) times daily.     Accu-Chek Softclix Lancets lancets Use to check blood glucose up to twice a day (Dx: Type 2 DM with long term insulin  use E11.57, Z79.4) 100 each 5   acetaminophen  (TYLENOL ) 500 MG tablet Take 1,000 mg by mouth 2 (two) times daily as needed for moderate pain or headache.     albuterol  (VENTOLIN  HFA) 108 (90 Base) MCG/ACT inhaler Inhale 2 puffs every 4-6 hours as needed - rescue. 18 g 12   blood glucose meter kit and supplies Dispense based on patient and insurance preference. Use up to four times daily as directed. (FOR ICD-10 E10.9, E11.9). 1 each 0   budesonide -formoterol  (SYMBICORT ) 160-4.5 MCG/ACT inhaler Inhale 2 puffs into the lungs 2 (two) times daily. As needed     cholecalciferol (VITAMIN D3) 25 MCG (1000 UNIT) tablet Take 2,000 Units by mouth daily.     Continuous Glucose Sensor (FREESTYLE LIBRE 3 SENSOR) MISC 1 each by Does not apply route every 14 (fourteen) days. Use to check blood glucose continuously. Change sensor every 14 days.     ferrous sulfate  325 (65 FE) MG EC tablet Take 325 mg by mouth every other day.     fluticasone  (FLONASE ) 50 MCG/ACT nasal spray Place 2 sprays into both nostrils daily. 16 g 6   furosemide  (LASIX ) 20 MG tablet Take 1 tablet (20 mg total) by mouth daily. 90 tablet 0   glucose blood (ACCU-CHEK AVIVA) test strip Twice daily (please use brand that patient insurance covers) 100 each 5   GNP ULTICARE PEN NEEDLES 32G X 4 MM MISC AS DIRECTED DAILY 100 each 3   levothyroxine  (SYNTHROID ) 200 MCG tablet Take 1 tablet (200 mcg total) by mouth daily before breakfast. Take with a 50mcg tablet daily  to equal 250mcg daily 90 tablet 0   losartan  (COZAAR ) 100 MG tablet Take 1 tablet (100 mg total) by mouth daily. 90 tablet 0   Semaglutide , 1 MG/DOSE, (OZEMPIC , 1 MG/DOSE,) 4 MG/3ML SOPN Inject 1 mg into the skin once a week. 9 mL 3   anastrozole  (ARIMIDEX ) 1 MG tablet TAKE ONE TABLET DAILY 90 tablet 3   aspirin  EC 81 MG tablet Take 81 mg by mouth daily. Swallow whole.     busPIRone  (BUSPAR ) 7.5 MG tablet TAKE ONE TABLET THREE TIMES DAILY AS NEEDED FOR ANXIETY 90 tablet 0   Butalbital -Acetaminophen  50-300 MG TABS Take 1 tablet by mouth 2 (two) times daily as needed (HA). 40 tablet 1   carvedilol  (COREG ) 25 MG tablet TAKE ONE TABLET TWICE DAILY WITH MEAL(S) 180 tablet 0   diltiazem  (CARDIZEM  CD) 180 MG 24 hr capsule Take 1 capsule (180 mg total) by mouth daily. 30 capsule 2   escitalopram  (LEXAPRO ) 20 MG tablet TAKE ONE TABLET EVERY MORNING AND ONE-HALF TABLET EVERY EVENING 135 tablet 0   esomeprazole  (NEXIUM ) 40 MG capsule TAKE ONE CAPSULE ONCE DAILY 90 capsule 0   ezetimibe  (ZETIA ) 10 MG tablet TAKE ONE TABLET ONCE DAILY 90 tablet 0  fenofibrate  (TRICOR ) 145 MG tablet TAKE ONE TABLET ONCE DAILY 90 tablet 0   insulin  degludec (TRESIBA  FLEXTOUCH) 200 UNIT/ML FlexTouch Pen Inject 12-20 Units into the skin daily. (Patient taking differently: Inject 8 Units into the skin daily.) 9 mL 3   levothyroxine  (SYNTHROID ) 50 MCG tablet TAKE ONE TABLET ONCE DAILY 90 tablet 0   loratadine  (CLARITIN ) 10 MG tablet Take 1 tablet (10 mg total) by mouth daily. 30 tablet 11   metFORMIN  (GLUCOPHAGE -XR) 500 MG 24 hr tablet TAKE 4 TABLETS DAILY WITH SUPPER 360 tablet 0   montelukast  (SINGULAIR ) 10 MG tablet TAKE ONE TABLET AT BEDTIME 90 tablet 0   rosuvastatin  (CRESTOR ) 40 MG tablet TAKE ONE TABLET DAILY 90 tablet 0   Vitamin D , Ergocalciferol , (DRISDOL ) 1.25 MG (50000 UNIT) CAPS capsule Take 1 capsule (50,000 Units total) by mouth every 7 (seven) days. 4 capsule 4   denosumab  (PROLIA ) injection 60 mg      No  facility-administered medications prior to visit.    Allergies  Allergen Reactions   Oxycodone  Other (See Comments)    Does not feel good. Makes her sleepy.    Review of Systems  Constitutional:  Positive for malaise/fatigue. Negative for fever.  HENT:  Negative for congestion.   Eyes:  Negative for blurred vision.  Respiratory:  Negative for shortness of breath.   Cardiovascular:  Negative for chest pain, palpitations and leg swelling.  Gastrointestinal:  Negative for abdominal pain, blood in stool and nausea.  Genitourinary:  Positive for frequency. Negative for dysuria.  Musculoskeletal:  Positive for falls.  Skin:  Negative for rash.  Neurological:  Positive for dizziness. Negative for loss of consciousness and headaches.  Endo/Heme/Allergies:  Negative for environmental allergies.  Psychiatric/Behavioral:  Negative for depression. The patient is not nervous/anxious.        Objective:    Physical Exam Constitutional:      General: She is not in acute distress.    Appearance: Normal appearance. She is not ill-appearing or toxic-appearing.  HENT:     Head: Normocephalic and atraumatic.     Right Ear: External ear normal.     Left Ear: External ear normal.     Nose: Nose normal.  Eyes:     General:        Right eye: No discharge.        Left eye: No discharge.  Pulmonary:     Effort: Pulmonary effort is normal.  Skin:    Findings: No rash.  Neurological:     Mental Status: She is alert and oriented to person, place, and time.  Psychiatric:        Behavior: Behavior normal.     There were no vitals taken for this visit. Wt Readings from Last 3 Encounters:  09/11/23 294 lb 1.5 oz (133.4 kg)  09/11/23 294 lb 1.5 oz (133.4 kg)  08/19/23 294 lb (133.4 kg)       Assessment & Plan:  Unsteady gait -     Ambulatory referral to Home Health  Brain abscess -     Ambulatory referral to Home Health  Other orders -     Cetirizine  HCl; Take 1 tablet (10 mg total)  by mouth daily. -     Escitalopram  Oxalate; 1 tab po qam and 2 tab po pm -     hydrALAZINE  HCl; Take 1 tablet (10 mg total) by mouth 3 (three) times daily. -     levETIRAcetam ; Take 1 tablet (1,000 mg total) by  mouth 2 (two) times daily. -     Potassium Chloride  Crys ER; Take 1 tablet (20 mEq total) by mouth 2 (two) times daily. -     Spironolactone ; Take 1 tablet (25 mg total) by mouth daily. -     Insulin  Glargine; Inject 10 Units into the skin daily. -     Ondansetron ; Take 1 tablet (4 mg total) by mouth 2 (two) times daily as needed for nausea or vomiting. -     Meclizine  HCl; Take 1 tablet (25 mg total) by mouth 3 (three) times daily as needed for dizziness.  Dispense: 30 tablet; Refill: 1     Assessment and Plan Assessment & Plan Brain abscess Post-surgical status with completed antibiotics. Awaiting neurology follow-up for EEG results indicating temporal lobe activity reduction. - Continue current medications until neurology follow-up on July 25. - Neurology video call on July 25 to discuss EEG results. - Neuropsychologist visit scheduled for September.  Vertigo Recent fall due to vertigo. Motion sickness and possible dehydration noted. Diuretics may contribute to dehydration. - Ensure adequate hydration with 5 ounces of water every hour. - Consider meclizine  for vertigo. - Arrange physical therapy for balance issues.  Urinary tract infection Recent UTI treated. Monitoring for recurrence due to recent health challenges.  Medication review Multiple medication changes with no duplication. Adjustments were made during hospitalization and rehab stay. Severe diarrhea with aspirin  noted, probiotics used. - Update medication list to reflect current regimen. - Continue cetirizine  10 mg daily. - Adjust Lexapro  to 1 tablet in the morning and 2 tablets at night. - Continue hydralazine  10 mg three times a day. - Continue Keppra  1000 mg twice a day until neurology follow-up. - Adjust  metformin  to 2 tablets twice a day. - Switch from omeprazole to esomeprazole . - Continue potassium chloride  20 mEq daily. - Continue spironolactone  25 mg daily. - Continue vitamin D  1000 IU daily. - Switch from Tresiba  to Lantus  Solostar 4 units daily. - Continue ondansetron  4 mg twice a day as needed. - Adjust buspirone  to 7.5 mg twice daily as needed. - Discontinue anastrozole . - Discontinue fluticasone . - Discontinue loratadine . - Continue fenofibrate  pending lab results. - Continue levothyroxine  50 mcg and 200 mcg daily. - Continue rosuvastatin  40 mg daily. - Continue montelukast  10 mg daily. - Continue carvedilol  25 mg twice daily. - Continue albuterol  and Symbicort  as needed. - Restart Ozempic  after lab work. - Continue diltiazem  180 mg daily. - Continue ezetimibe  10 mg daily. - Continue furosemide  20 mg daily. - Add isosorbide  10 mg, 1.5 tablets three times a day.  General Health Maintenance Delayed mammogram due to recent health issues. Regular cancer screening needed. - Schedule mammogram when feasible.  Goals of Care Discussion of assisted living due to health challenges and need for 24/7 care. Family exploring options for safety and quality of life. - Send chronic care management team for evaluation. - Send home health team for PT/OT evaluation. - Assist with paperwork for assisted living waiting list.  Follow-up Plan for follow-up visits and lab work to reassess medication needs and health status. - Arrange lab work in one month. - Schedule follow-up visit two to three months after lab work. - Coordinate with neurology and other specialists as needed.     I discussed the assessment and treatment plan with the patient. The patient was provided an opportunity to ask questions and all were answered. The patient agreed with the plan and demonstrated an understanding of the instructions.   The patient  was advised to call back or seek an in-person evaluation if the  symptoms worsen or if the condition fails to improve as anticipated.  Harlene Horton, MD [facility] at [facility] [phone] (phone) [phone] (fax)  [facility]

## 2024-01-12 ENCOUNTER — Other Ambulatory Visit: Payer: Self-pay | Admitting: Family Medicine

## 2024-01-12 DIAGNOSIS — Z79899 Other long term (current) drug therapy: Secondary | ICD-10-CM | POA: Diagnosis not present

## 2024-01-12 DIAGNOSIS — Z7985 Long-term (current) use of injectable non-insulin antidiabetic drugs: Secondary | ICD-10-CM | POA: Diagnosis not present

## 2024-01-12 DIAGNOSIS — G4733 Obstructive sleep apnea (adult) (pediatric): Secondary | ICD-10-CM | POA: Diagnosis not present

## 2024-01-12 DIAGNOSIS — Z48811 Encounter for surgical aftercare following surgery on the nervous system: Secondary | ICD-10-CM | POA: Diagnosis not present

## 2024-01-12 DIAGNOSIS — I509 Heart failure, unspecified: Secondary | ICD-10-CM | POA: Diagnosis not present

## 2024-01-12 DIAGNOSIS — Z794 Long term (current) use of insulin: Secondary | ICD-10-CM | POA: Diagnosis not present

## 2024-01-12 DIAGNOSIS — E890 Postprocedural hypothyroidism: Secondary | ICD-10-CM | POA: Diagnosis not present

## 2024-01-12 DIAGNOSIS — I110 Hypertensive heart disease with heart failure: Secondary | ICD-10-CM | POA: Diagnosis not present

## 2024-01-12 DIAGNOSIS — Z85850 Personal history of malignant neoplasm of thyroid: Secondary | ICD-10-CM | POA: Diagnosis not present

## 2024-01-12 DIAGNOSIS — E1151 Type 2 diabetes mellitus with diabetic peripheral angiopathy without gangrene: Secondary | ICD-10-CM | POA: Diagnosis not present

## 2024-01-12 DIAGNOSIS — J4520 Mild intermittent asthma, uncomplicated: Secondary | ICD-10-CM | POA: Diagnosis not present

## 2024-01-12 DIAGNOSIS — I2510 Atherosclerotic heart disease of native coronary artery without angina pectoris: Secondary | ICD-10-CM | POA: Diagnosis not present

## 2024-01-12 DIAGNOSIS — Z853 Personal history of malignant neoplasm of breast: Secondary | ICD-10-CM | POA: Diagnosis not present

## 2024-01-12 DIAGNOSIS — E785 Hyperlipidemia, unspecified: Secondary | ICD-10-CM | POA: Diagnosis not present

## 2024-01-12 DIAGNOSIS — I7121 Aneurysm of the ascending aorta, without rupture: Secondary | ICD-10-CM | POA: Diagnosis not present

## 2024-01-12 DIAGNOSIS — R569 Unspecified convulsions: Secondary | ICD-10-CM | POA: Diagnosis not present

## 2024-01-12 DIAGNOSIS — Z556 Problems related to health literacy: Secondary | ICD-10-CM | POA: Diagnosis not present

## 2024-01-12 DIAGNOSIS — Z7984 Long term (current) use of oral hypoglycemic drugs: Secondary | ICD-10-CM | POA: Diagnosis not present

## 2024-01-12 DIAGNOSIS — Z96653 Presence of artificial knee joint, bilateral: Secondary | ICD-10-CM | POA: Diagnosis not present

## 2024-01-12 DIAGNOSIS — H9190 Unspecified hearing loss, unspecified ear: Secondary | ICD-10-CM | POA: Diagnosis not present

## 2024-01-12 MED ORDER — BUDESONIDE-FORMOTEROL FUMARATE 160-4.5 MCG/ACT IN AERO: 2.0000 | INHALATION_SPRAY | Freq: Two times a day (BID) | RESPIRATORY_TRACT | 5 refills | Status: AC

## 2024-01-12 MED ORDER — ISOSORBIDE DINITRATE 10 MG PO TABS: 10.0000 mg | ORAL_TABLET | Freq: Three times a day (TID) | ORAL | 5 refills | Status: DC

## 2024-01-12 NOTE — Telephone Encounter (Signed)
 Copied from CRM [id number]. Topic: Clinical - Medication Refill >> Jan 12, 2024  1:12 PM Chiquita SQUIBB wrote: Medication:  isosorbide  dinitrate isosorbide  dinitrate (ISORDIL ) 10 MG tablet   hydrALAZINE  hydrALAZINE  (APRESOLINE ) 10 MG tablet   cholecalciferol cholecalciferol (VITAMIN D3) 25 MCG (1000 UNIT) tablet   spironolactone  spironolactone  (ALDACTONE ) 25 MG tablet   potassium chloride  SA potassium chloride  SA (KLOR-CON  M) 20 MEQ tablet   cetirizine  cetirizine  (ZYRTEC ) 10 MG tablet  Has the patient contacted their pharmacy? Yes- The pharmacy has no record of these medications  (Agent: If no, request that the patient contact the pharmacy for the refill. If patient does not wish to contact the pharmacy document the reason why and proceed with request.) (Agent: If yes, when and what did the pharmacy advise?)  This is the patient's preferred pharmacy:  [facility] [place], [place] - [address] [place] [address] Phone: [phone] Fax: [phone]    Is this the correct pharmacy for this prescription? Yes If no, delete pharmacy and type the correct one.   Has the prescription been filled recently? No  Is the patient out of the medication? No but only has enough to get through tomorrow   Has the patient been seen for an appointment in the last year OR does the patient have an upcoming appointment? Yes  Can we respond through MyChart? No  Agent: Please be advised that Rx refills may take up to 3 business days. We ask that you follow-up with your pharmacy.

## 2024-01-13 ENCOUNTER — Telehealth: Payer: Self-pay | Admitting: Pharmacist

## 2024-01-13 ENCOUNTER — Other Ambulatory Visit: Payer: Self-pay | Admitting: Pharmacist

## 2024-01-13 MED ORDER — POTASSIUM CHLORIDE CRYS ER 20 MEQ PO TBCR: 20.0000 meq | EXTENDED_RELEASE_TABLET | Freq: Two times a day (BID) | ORAL | 0 refills | Status: DC

## 2024-01-13 MED ORDER — SPIRONOLACTONE 25 MG PO TABS: 25.0000 mg | ORAL_TABLET | Freq: Every day | ORAL | 0 refills | Status: DC

## 2024-01-13 MED ORDER — CETIRIZINE HCL 10 MG PO TABS: 10.0000 mg | ORAL_TABLET | Freq: Every day | ORAL | 0 refills | Status: AC

## 2024-01-13 MED ORDER — VITAMIN D 25 MCG (1000 UNIT) PO TABS: 2000.0000 [IU] | ORAL_TABLET | Freq: Every day | ORAL | 0 refills | Status: AC

## 2024-01-13 MED ORDER — HYDRALAZINE HCL 10 MG PO TABS: 10.0000 mg | ORAL_TABLET | Freq: Three times a day (TID) | ORAL | 0 refills | Status: DC

## 2024-01-13 NOTE — Telephone Encounter (Signed)
 Received call from pharmacy regarding refill requests form yesterday - looks like only isosorbide  was sent. Seen Documentation notes.

## 2024-01-14 DIAGNOSIS — I7121 Aneurysm of the ascending aorta, without rupture: Secondary | ICD-10-CM | POA: Diagnosis not present

## 2024-01-14 DIAGNOSIS — R569 Unspecified convulsions: Secondary | ICD-10-CM | POA: Diagnosis not present

## 2024-01-14 DIAGNOSIS — I2510 Atherosclerotic heart disease of native coronary artery without angina pectoris: Secondary | ICD-10-CM | POA: Diagnosis not present

## 2024-01-14 DIAGNOSIS — E1151 Type 2 diabetes mellitus with diabetic peripheral angiopathy without gangrene: Secondary | ICD-10-CM | POA: Diagnosis not present

## 2024-01-14 DIAGNOSIS — J4520 Mild intermittent asthma, uncomplicated: Secondary | ICD-10-CM | POA: Diagnosis not present

## 2024-01-14 DIAGNOSIS — Z85850 Personal history of malignant neoplasm of thyroid: Secondary | ICD-10-CM | POA: Diagnosis not present

## 2024-01-14 DIAGNOSIS — Z794 Long term (current) use of insulin: Secondary | ICD-10-CM | POA: Diagnosis not present

## 2024-01-14 DIAGNOSIS — H9190 Unspecified hearing loss, unspecified ear: Secondary | ICD-10-CM | POA: Diagnosis not present

## 2024-01-14 DIAGNOSIS — I110 Hypertensive heart disease with heart failure: Secondary | ICD-10-CM | POA: Diagnosis not present

## 2024-01-14 DIAGNOSIS — Z853 Personal history of malignant neoplasm of breast: Secondary | ICD-10-CM | POA: Diagnosis not present

## 2024-01-14 DIAGNOSIS — E785 Hyperlipidemia, unspecified: Secondary | ICD-10-CM | POA: Diagnosis not present

## 2024-01-14 DIAGNOSIS — Z96653 Presence of artificial knee joint, bilateral: Secondary | ICD-10-CM | POA: Diagnosis not present

## 2024-01-14 DIAGNOSIS — Z48811 Encounter for surgical aftercare following surgery on the nervous system: Secondary | ICD-10-CM | POA: Diagnosis not present

## 2024-01-14 DIAGNOSIS — Z7984 Long term (current) use of oral hypoglycemic drugs: Secondary | ICD-10-CM | POA: Diagnosis not present

## 2024-01-14 DIAGNOSIS — I509 Heart failure, unspecified: Secondary | ICD-10-CM | POA: Diagnosis not present

## 2024-01-14 DIAGNOSIS — Z79899 Other long term (current) drug therapy: Secondary | ICD-10-CM | POA: Diagnosis not present

## 2024-01-14 DIAGNOSIS — Z556 Problems related to health literacy: Secondary | ICD-10-CM | POA: Diagnosis not present

## 2024-01-14 DIAGNOSIS — Z7985 Long-term (current) use of injectable non-insulin antidiabetic drugs: Secondary | ICD-10-CM | POA: Diagnosis not present

## 2024-01-14 DIAGNOSIS — G4733 Obstructive sleep apnea (adult) (pediatric): Secondary | ICD-10-CM | POA: Diagnosis not present

## 2024-01-14 DIAGNOSIS — E890 Postprocedural hypothyroidism: Secondary | ICD-10-CM | POA: Diagnosis not present

## 2024-01-15 NOTE — Progress Notes (Signed)
 01/13/2024 Name: [name] MRN: [id number] DOB: Jul 14, 1950  Chief Complaint  Patient presents with   Medication Management    [name] is a 73 y.o. year old female who presented for a telephone visit.   They were referred to the pharmacist by their PCP for assistance in managing diabetes, medication access, and complex medication management.    Subjective: Received a call from [name] regular pharmacy that they are in need of refills for the following medications - hydralazine  10mg  (patient has 1 dose left), spironolactone  25mg , potassium chloride  20 mEq, cetirizine  and vitamin D .  It looks like there was a request 7/21 but refills but only isosorbide  was sent in. Her pharmacy also mentions that they are not able to fill some of [name]'s medications because they have been filled within the last few days somewhere else.   Mrs. [name] has recently has brain surgery and due to abscess she had an extended post surgical stay in rehab fro IV antibiotic therapy. Per her daughter she was initall at [facility] in [place] but was later moved to [facility].  Her medications were being provided by [facility] while she was in rehab.   Medication Access/Adherence  Current Pharmacy:  [facility] [place], [place] - [address] [place] [address] Phone: [phone] Fax: [phone]  [id number] - [place], [place] - [address] [place] [address] Phone: [phone] Fax: [phone]   Patient reports affordability concerns with their medications: No  - She has received Ozempic  from [facility] in past but per recent office notes Ozempic  was stopped in March 2025 - I am wondering if it was held prior to surgery and has not been restarted yet.   Patient reports access/transportation concerns to their pharmacy: No  - family provides transportation and her pharmacy  delivers meds to her.  Patient reports adherence concerns with their medications:  No      Diabetes:  Current medications: Lantus  4 units at bedtime, metformin  XR 500mg  twice a day  Medications tried in the past: Ozempic  - stopped in March 2025 - suspect was held prior to surgery.    Hypertension / CHF  Current medications: hydralazine  10mg  3 times a day (every 8 hours), losartan  100mg  daily, spironolactone  25mg  daily, carvedilol  25mg  twice a day, diltiazem  180mg  daily and furosemide  20mg  daily   Medications previously tried: Hydralazine  100mg  3 times a day was on her medication list at discharge from the hospital but per refill records form [facility] she has been taking hydralazine  10mg  3 times a day since Oct 23, 2023.  Patient has a validated, automated, upper arm home BP cuff Current blood pressure readings readings: has not been checking blood pressure at home.     Objective:  BP Readings from Last 3 Encounters:  09/12/23 (!) 148/91  09/11/23 (!) 198/98  09/08/23 (!) 162/109     Lab Results  Component Value Date   HGBA1C 7 09/04/2023    Lab Results  Component Value Date   CREATININE 0.70 09/11/2023   CREATININE 0.79 09/11/2023   BUN 16 09/11/2023   BUN 14 09/11/2023   NA 137 09/11/2023   NA 136 09/11/2023   K 3.9 09/11/2023   K 3.9 09/11/2023   CL 102 09/11/2023   CL 102 09/11/2023   CO2 23 09/11/2023    Lab Results  Component Value Date   CHOL 114  07/17/2023   HDL 40.10 07/17/2023   LDLCALC 38 07/17/2023   LDLDIRECT 66.0 10/24/2022   TRIG 179.0 (H) 07/17/2023   CHOLHDL 3 07/17/2023    Medications Reviewed Today     Reviewed by Carla Milling, RPH-CPP (Pharmacist) on 01/13/24 at 1027  Med List Status: <None>   Medication Order Taking? Sig Documenting Provider Last Dose Status Informant  Accu-Chek Softclix Lancets lancets [id number]  Use to check blood glucose up to twice a day (Dx: Type 2 DM with long term insulin  use E11.57, Z79.4) Domenica Harlene LABOR, MD  Active   acetaminophen  (TYLENOL ) 500 MG tablet [id number] Yes Take 1,000 mg by mouth 2 (two) times daily as needed for moderate pain or headache. [provider]  Active Self  albuterol  (VENTOLIN  HFA) 108 (90 Base) MCG/ACT inhaler [id number]  Inhale 2 puffs every 4-6 hours as needed - rescue. Neysa Rama D, MD  Active   blood glucose meter kit and supplies [id number]  Dispense based on patient and insurance preference. Use up to four times daily as directed. (FOR ICD-10 E10.9, E11.9). Domenica Harlene LABOR, MD  Active   budesonide -formoterol  (SYMBICORT ) 160-4.5 MCG/ACT inhaler [id number] Yes Inhale 2 puffs into the lungs 2 (two) times daily. As needed Domenica Harlene LABOR, MD  Active   busPIRone  (BUSPAR ) 7.5 MG tablet [id number]  TAKE ONE TABLET THREE TIMES DAILY AS NEEDED FOR ANXIETY Domenica Harlene LABOR, MD  Active   carvedilol  (COREG ) 25 MG tablet [id number]  TAKE ONE TABLET TWICE DAILY WITH MEAL(S) Domenica Harlene LABOR, MD  Active   cetirizine  (ZYRTEC ) 10 MG tablet [id number]  Take 1 tablet (10 mg total) by mouth daily. Domenica Harlene LABOR, MD  Active   cholecalciferol (VITAMIN D3) 25 MCG (1000 UNIT) tablet [id number]  Take 2 tablets (2,000 Units total) by mouth daily. Domenica Harlene LABOR, MD  Active   Continuous Glucose Sensor (FREESTYLE LIBRE 3 SENSOR) [place] [id number]  1 each by Does not apply route every 14 (fourteen) days. Use to check blood glucose continuously. Change sensor every 14 days. [provider]  Active            Med Note JUSTINO, MILLING KATHEE Kitchens Apr 14, 2023 11:25 AM) Plan to change to [facility] 3 plus  diltiazem  (CARDIZEM  CD) 180 MG 24 hr capsule [id number]  TAKE ONE CAPSULE BY MOUTH DAILY Domenica Harlene LABOR, MD  Active   escitalopram  (LEXAPRO ) 20 MG tablet [id number]  1 tab po qam and 2 tab po pm Domenica Harlene LABOR, MD  Active   esomeprazole  (NEXIUM ) 40 MG capsule [id number]  TAKE ONE CAPSULE ONCE DAILY Domenica Harlene LABOR, MD  Active   ezetimibe  (ZETIA ) 10 MG tablet [id number]  TAKE ONE TABLET ONCE  DAILY Domenica Harlene LABOR, MD  Active   fenofibrate  (TRICOR ) 145 MG tablet [id number]  TAKE ONE TABLET ONCE DAILY  Patient not taking: Reported on 01/13/2024   Domenica Harlene LABOR, MD  Active   ferrous sulfate  325 (65 FE) MG EC tablet [id number]  Take 325 mg by mouth every other day. [provider]  Active   fluticasone  (FLONASE ) 50 MCG/ACT nasal spray [id number]  Place 2 sprays into both nostrils daily. Domenica Harlene LABOR, MD  Active   furosemide  (LASIX ) 20 MG tablet [id number]  Take 1 tablet (20 mg total) by mouth daily. Domenica Harlene LABOR, MD  Active   glucose blood (ACCU-CHEK AVIVA) test strip [id number]  Twice daily (please use brand that patient insurance covers) Domenica Harlene LABOR, MD  Active   GNP ULTICARE PEN NEEDLES 32G X 4 MM MISC [id number]  AS DIRECTED DAILY Trixie File, MD  Active   hydrALAZINE  (APRESOLINE ) 10 MG tablet [id number]  Take 1 tablet (10 mg total) by mouth 3 (three) times daily. Domenica Harlene LABOR, MD  Active   insulin  glargine (LANTUS ) 100 UNIT/ML Solostar Pen [id number]  Inject 10 Units into the skin daily. Domenica Harlene LABOR, MD  Active   isosorbide  dinitrate (ISORDIL ) 10 MG tablet [id number]  Take 1 tablet (10 mg total) by mouth 3 (three) times daily. Domenica Harlene LABOR, MD  Active   levETIRAcetam  (KEPPRA ) 1000 MG tablet [id number]  Take 1 tablet (1,000 mg total) by mouth 2 (two) times daily. Domenica Harlene LABOR, MD  Active   levothyroxine  (SYNTHROID ) 200 MCG tablet [id number]  Take 1 tablet (200 mcg total) by mouth daily before breakfast. Take with a 50mcg tablet daily to equal 250mcg daily Domenica Harlene LABOR, MD  Active   levothyroxine  (SYNTHROID ) 50 MCG tablet [id number]  TAKE ONE TABLET ONCE DAILY Domenica Harlene LABOR, MD  Active   losartan  (COZAAR ) 100 MG tablet [id number]  Take 1 tablet (100 mg total) by mouth daily. Domenica Harlene LABOR, MD  Active   meclizine  (ANTIVERT ) 25 MG tablet [id number]  Take 1 tablet (25 mg total) by mouth 3 (three) times daily as needed for dizziness. Domenica Harlene LABOR, MD  Active    metFORMIN  (GLUCOPHAGE -XR) 500 MG 24 hr tablet [id number]  TAKE 4 TABLETS DAILY WITH SUPPER Domenica Harlene LABOR, MD  Active   montelukast  (SINGULAIR ) 10 MG tablet [id number]  TAKE ONE TABLET AT BEDTIME Domenica Harlene LABOR, MD  Active   ondansetron  (ZOFRAN -ODT) 4 MG disintegrating tablet [id number]  Take 1 tablet (4 mg total) by mouth 2 (two) times daily as needed for nausea or vomiting. Domenica Harlene LABOR, MD  Active   potassium chloride  SA (KLOR-CON  M) 20 MEQ tablet [id number]  Take 1 tablet (20 mEq total) by mouth 2 (two) times daily. Domenica Harlene LABOR, MD  Active   rosuvastatin  (CRESTOR ) 40 MG tablet [id number]  TAKE ONE TABLET DAILY Domenica Harlene LABOR, MD  Active   Semaglutide , 1 MG/DOSE, (OZEMPIC , 1 MG/DOSE,) 4 MG/3ML SOPN [id number]  Inject 1 mg into the skin once a week.  Patient not taking: Reported on 01/13/2024   Trixie File, MD  Active            Med Note JUSTINO, [facility]   Tue Nov 05, 2022  8:59 AM) Approved to get from Novo Nordisk thru 06/24/2023  spironolactone  (ALDACTONE ) 25 MG tablet [id number]  Take 1 tablet (25 mg total) by mouth daily. Domenica Harlene LABOR, MD  Active               Assessment/Plan:   Hypertension:Currently uncontrolled per last blood pressure in office but this was from March 2025 - Patient's daughter will check blood pressure over the next week and record. - Recommend to continue hydralazine  10mg  3 times a day - if blood pressure remains above 140/90, consider increasing hydralazine .  - continue spironolactone , furosemide , diltiazem  and losartan    Medication Management: - Currently strategy sufficient to maintain appropriate adherence to prescribed medication regimen - Asked for discharge med list from [facility] - had to LM on VM.  - sent in requested medications for 1 month - no refills. - Called [facility] - they have filled meds (even thought patient was discharged 12/24/23 form [facility] - pharmacy was not notified by facility). Patient's daughter  will pick up medication [facility] the [facility].   Meds ordered this encounter  Medications   cetirizine  (ZYRTEC ) 10 MG tablet    Sig: Take 1 tablet (10 mg total) by mouth daily.    Dispense:  100 tablet    Refill:  0   cholecalciferol (VITAMIN D3) 25 MCG (1000 UNIT) tablet    Sig: Take 2 tablets (2,000 Units total) by mouth daily.    Dispense:  100 tablet    Refill:  0   hydrALAZINE  (APRESOLINE ) 10 MG tablet    Sig: Take 1 tablet (10 mg total) by mouth 3 (three) times daily.    Dispense:  90 tablet    Refill:  0   potassium chloride  SA (KLOR-CON  M) 20 MEQ tablet    Sig: Take 1 tablet (20 mEq total) by mouth 2 (two) times daily.    Dispense:  60 tablet    Refill:  0   spironolactone  (ALDACTONE ) 25 MG tablet    Sig: Take 1 tablet (25 mg total) by mouth daily.    Dispense:  30 tablet    Refill:  0     Follow Up Plan: 1 week  Madelin Ray, PharmD Clinical Pharmacist [facility]  Population Health [phone]

## 2024-01-16 DIAGNOSIS — Z8619 Personal history of other infectious and parasitic diseases: Secondary | ICD-10-CM | POA: Diagnosis not present

## 2024-01-16 DIAGNOSIS — Z09 Encounter for follow-up examination after completed treatment for conditions other than malignant neoplasm: Secondary | ICD-10-CM | POA: Diagnosis not present

## 2024-01-16 DIAGNOSIS — R569 Unspecified convulsions: Secondary | ICD-10-CM | POA: Diagnosis not present

## 2024-01-16 DIAGNOSIS — G9600 Cerebrospinal fluid leak, unspecified: Secondary | ICD-10-CM | POA: Diagnosis not present

## 2024-01-16 DIAGNOSIS — I10 Essential (primary) hypertension: Secondary | ICD-10-CM | POA: Diagnosis not present

## 2024-01-16 DIAGNOSIS — Z794 Long term (current) use of insulin: Secondary | ICD-10-CM | POA: Diagnosis not present

## 2024-01-16 DIAGNOSIS — E119 Type 2 diabetes mellitus without complications: Secondary | ICD-10-CM | POA: Diagnosis not present

## 2024-01-16 DIAGNOSIS — Z79899 Other long term (current) drug therapy: Secondary | ICD-10-CM | POA: Diagnosis not present

## 2024-01-16 DIAGNOSIS — Z885 Allergy status to narcotic agent status: Secondary | ICD-10-CM | POA: Diagnosis not present

## 2024-01-16 DIAGNOSIS — Z7985 Long-term (current) use of injectable non-insulin antidiabetic drugs: Secondary | ICD-10-CM | POA: Diagnosis not present

## 2024-01-16 DIAGNOSIS — Z7984 Long term (current) use of oral hypoglycemic drugs: Secondary | ICD-10-CM | POA: Diagnosis not present

## 2024-01-20 ENCOUNTER — Encounter: Payer: Self-pay | Admitting: Family Medicine

## 2024-01-20 DIAGNOSIS — Z85850 Personal history of malignant neoplasm of thyroid: Secondary | ICD-10-CM | POA: Diagnosis not present

## 2024-01-20 DIAGNOSIS — I7121 Aneurysm of the ascending aorta, without rupture: Secondary | ICD-10-CM | POA: Diagnosis not present

## 2024-01-20 DIAGNOSIS — Z79899 Other long term (current) drug therapy: Secondary | ICD-10-CM | POA: Diagnosis not present

## 2024-01-20 DIAGNOSIS — I509 Heart failure, unspecified: Secondary | ICD-10-CM | POA: Diagnosis not present

## 2024-01-20 DIAGNOSIS — J4520 Mild intermittent asthma, uncomplicated: Secondary | ICD-10-CM | POA: Diagnosis not present

## 2024-01-20 DIAGNOSIS — Z794 Long term (current) use of insulin: Secondary | ICD-10-CM | POA: Diagnosis not present

## 2024-01-20 DIAGNOSIS — Z48811 Encounter for surgical aftercare following surgery on the nervous system: Secondary | ICD-10-CM | POA: Diagnosis not present

## 2024-01-20 DIAGNOSIS — E1151 Type 2 diabetes mellitus with diabetic peripheral angiopathy without gangrene: Secondary | ICD-10-CM | POA: Diagnosis not present

## 2024-01-20 DIAGNOSIS — E785 Hyperlipidemia, unspecified: Secondary | ICD-10-CM | POA: Diagnosis not present

## 2024-01-20 DIAGNOSIS — Z96653 Presence of artificial knee joint, bilateral: Secondary | ICD-10-CM | POA: Diagnosis not present

## 2024-01-20 DIAGNOSIS — G4733 Obstructive sleep apnea (adult) (pediatric): Secondary | ICD-10-CM | POA: Diagnosis not present

## 2024-01-20 DIAGNOSIS — E890 Postprocedural hypothyroidism: Secondary | ICD-10-CM | POA: Diagnosis not present

## 2024-01-20 DIAGNOSIS — Z7984 Long term (current) use of oral hypoglycemic drugs: Secondary | ICD-10-CM | POA: Diagnosis not present

## 2024-01-20 DIAGNOSIS — Z556 Problems related to health literacy: Secondary | ICD-10-CM | POA: Diagnosis not present

## 2024-01-20 DIAGNOSIS — Z7985 Long-term (current) use of injectable non-insulin antidiabetic drugs: Secondary | ICD-10-CM | POA: Diagnosis not present

## 2024-01-20 DIAGNOSIS — R569 Unspecified convulsions: Secondary | ICD-10-CM | POA: Diagnosis not present

## 2024-01-20 DIAGNOSIS — I2510 Atherosclerotic heart disease of native coronary artery without angina pectoris: Secondary | ICD-10-CM | POA: Diagnosis not present

## 2024-01-20 DIAGNOSIS — I110 Hypertensive heart disease with heart failure: Secondary | ICD-10-CM | POA: Diagnosis not present

## 2024-01-20 DIAGNOSIS — H9190 Unspecified hearing loss, unspecified ear: Secondary | ICD-10-CM | POA: Diagnosis not present

## 2024-01-20 DIAGNOSIS — Z853 Personal history of malignant neoplasm of breast: Secondary | ICD-10-CM | POA: Diagnosis not present

## 2024-01-21 NOTE — Telephone Encounter (Signed)
 Spoke with daughter and she going to have the sitter to check bp and heart rate today,01/21/24 and send a Mychart message. She reports heart has stayed in 50's and yesterday, 01/20/24 at 3pm patient bp was 124/64. She wants to know if the new medication Isordil  would be causing the changes?

## 2024-01-22 ENCOUNTER — Encounter: Payer: Self-pay | Admitting: Pharmacist

## 2024-01-22 ENCOUNTER — Other Ambulatory Visit: Payer: Self-pay | Admitting: Pharmacist

## 2024-01-22 DIAGNOSIS — Z7985 Long-term (current) use of injectable non-insulin antidiabetic drugs: Secondary | ICD-10-CM | POA: Diagnosis not present

## 2024-01-22 DIAGNOSIS — Z48811 Encounter for surgical aftercare following surgery on the nervous system: Secondary | ICD-10-CM | POA: Diagnosis not present

## 2024-01-22 DIAGNOSIS — I2510 Atherosclerotic heart disease of native coronary artery without angina pectoris: Secondary | ICD-10-CM | POA: Diagnosis not present

## 2024-01-22 DIAGNOSIS — I509 Heart failure, unspecified: Secondary | ICD-10-CM | POA: Diagnosis not present

## 2024-01-22 DIAGNOSIS — J4520 Mild intermittent asthma, uncomplicated: Secondary | ICD-10-CM | POA: Diagnosis not present

## 2024-01-22 DIAGNOSIS — Z96653 Presence of artificial knee joint, bilateral: Secondary | ICD-10-CM | POA: Diagnosis not present

## 2024-01-22 DIAGNOSIS — I110 Hypertensive heart disease with heart failure: Secondary | ICD-10-CM | POA: Diagnosis not present

## 2024-01-22 DIAGNOSIS — E785 Hyperlipidemia, unspecified: Secondary | ICD-10-CM | POA: Diagnosis not present

## 2024-01-22 DIAGNOSIS — G4733 Obstructive sleep apnea (adult) (pediatric): Secondary | ICD-10-CM | POA: Diagnosis not present

## 2024-01-22 DIAGNOSIS — Z794 Long term (current) use of insulin: Secondary | ICD-10-CM | POA: Diagnosis not present

## 2024-01-22 DIAGNOSIS — Z556 Problems related to health literacy: Secondary | ICD-10-CM | POA: Diagnosis not present

## 2024-01-22 DIAGNOSIS — Z85850 Personal history of malignant neoplasm of thyroid: Secondary | ICD-10-CM | POA: Diagnosis not present

## 2024-01-22 DIAGNOSIS — Z853 Personal history of malignant neoplasm of breast: Secondary | ICD-10-CM | POA: Diagnosis not present

## 2024-01-22 DIAGNOSIS — I7121 Aneurysm of the ascending aorta, without rupture: Secondary | ICD-10-CM | POA: Diagnosis not present

## 2024-01-22 DIAGNOSIS — Z7984 Long term (current) use of oral hypoglycemic drugs: Secondary | ICD-10-CM | POA: Diagnosis not present

## 2024-01-22 DIAGNOSIS — Z79899 Other long term (current) drug therapy: Secondary | ICD-10-CM | POA: Diagnosis not present

## 2024-01-22 DIAGNOSIS — R569 Unspecified convulsions: Secondary | ICD-10-CM | POA: Diagnosis not present

## 2024-01-22 DIAGNOSIS — E890 Postprocedural hypothyroidism: Secondary | ICD-10-CM | POA: Diagnosis not present

## 2024-01-22 DIAGNOSIS — E1151 Type 2 diabetes mellitus with diabetic peripheral angiopathy without gangrene: Secondary | ICD-10-CM | POA: Diagnosis not present

## 2024-01-22 DIAGNOSIS — H9190 Unspecified hearing loss, unspecified ear: Secondary | ICD-10-CM | POA: Diagnosis not present

## 2024-01-22 MED ORDER — FREESTYLE LIBRE 3 PLUS SENSOR MISC: 2 refills | Status: DC

## 2024-01-22 NOTE — Progress Notes (Signed)
 01/13/2024 Name: [name] MRN: [id number] DOB: [birth year]  Chief Complaint  Patient presents with   Medication Management   Diabetes    [name] is a 73 y.o. year old female who presented for a telephone visit. Spoke with patient's daughter today.    They were referred to the pharmacist by their PCP for assistance in managing diabetes, medication access, and complex medication management.    Subjective: [name] has recently has brain surgery and due to abscess she had an extended post surgical stay in rehab fro IV antibiotic therapy. Per her daughter she was initally at [facility] in [place] but was later moved to [facility].   Her medications were being provided by [facility] while she was in rehab. There was some difficulty getting her home medications last week because the rehab facility's pharmacy had already filled her medications 7/17 but she was discharged from facility 7/9. Daughter was able to get medication from [facility] last week. She is using weekly pill containers for adherence.  Patient has a Comptroller with her or family that helps her remember to take her meds 3 times per day.   Medication Access/Adherence  Current Pharmacy:  [facility] [place], [place] - [address] [place] [address] Phone: [phone] Fax: [phone]  [id number] [place], [place] - [address] [place] [address] Phone: [phone] Fax: [phone]   Patient reports affordability concerns with their medications: No  - She has received Ozempic  from Novo Nordisk in past but per recent office notes Ozempic  was stopped in March 2025 - I am wondering if it was held prior to surgery and has not been restarted yet.   Patient reports access/transportation concerns to their pharmacy: No  - family provides transportation and her pharmacy delivers meds to her.  Patient  reports adherence concerns with their medications:  No      Diabetes:  Current medications: Lantus  4 units at bedtime, metformin  XR 500mg  twice a day  Medications tried in the past: Ozempic  - stopped in March 2025 - suspect was held prior to surgery.   Patient has not been checking blood glucose at home recently. In past she used Libre 3 Continuous Glucose Monitor system. Daughter would like to restart using the [place] sensors if possible.    Hypertension / CHF  Current medications: diltiazem  180mg  daily and furosemide  20mg  daily, hydralazine  10mg  3 times a day (every 8 hours), losartan  100mg  daily, spironolactone  25mg  daily, carvedilol  25mg  - dose was lowered to take 0.5 tablet = 12.5mg  twice a day per MyChart message 07/33/2025,   Medications previously tried: Hydralazine  100mg  3 times a day was on her medication list at discharge from the hospital but per refill records from [facility] she has been taking hydralazine  10mg  3 times a day since Oct 23, 2023.  Patient has a validated, automated, upper arm home BP cuff and home health has been checking blood pressure. Recent blood pressure readings:  01/20/2024 blood pressure was 95/58; waited a few minutes and was 120/58 At 3pm blood pressure was 124/64 and HR in the 50's  01/21/2024 - blood pressure was 134/58 and HR 57 - Dr Domenica recommended lowering carvedilol  dose to 0.5 tablet    Objective:  BP Readings from Last 3 Encounters:  09/12/23 (!) 148/91  09/11/23 (!) 198/98  09/08/23 (!) 162/109     Lab Results  Component Value  Date   HGBA1C 7 09/04/2023    Lab Results  Component Value Date   CREATININE 0.70 09/11/2023   CREATININE 0.79 09/11/2023   BUN 16 09/11/2023   BUN 14 09/11/2023   NA 137 09/11/2023   NA 136 09/11/2023   K 3.9 09/11/2023   K 3.9 09/11/2023   CL 102 09/11/2023   CL 102 09/11/2023   CO2 23 09/11/2023    Lab Results  Component Value Date   CHOL 114 07/17/2023   HDL 40.10 07/17/2023    LDLCALC 38 07/17/2023   LDLDIRECT 66.0 10/24/2022   TRIG 179.0 (H) 07/17/2023   CHOLHDL 3 07/17/2023    Medications Reviewed Today     Reviewed by Carla Milling, RPH-CPP (Pharmacist) on 01/22/24 at 0955  Med List Status: <None>   Medication Order Taking? Sig Documenting Provider Last Dose Status Informant  Accu-Chek Softclix Lancets lancets [id number] Yes Use to check blood glucose up to twice a day (Dx: Type 2 DM with long term insulin  use E11.57, Z79.4) Domenica Harlene LABOR, MD  Active   acetaminophen  (TYLENOL ) 500 MG tablet [id number] Yes Take 1,000 mg by mouth 2 (two) times daily as needed for moderate pain or headache. [provider]  Active Self  albuterol  (VENTOLIN  HFA) 108 (90 Base) MCG/ACT inhaler [id number] Yes Inhale 2 puffs every 4-6 hours as needed - rescue. Neysa Rama D, MD  Active   blood glucose meter kit and supplies [id number] Yes Dispense based on patient and insurance preference. Use up to four times daily as directed. (FOR ICD-10 E10.9, E11.9). Domenica Harlene LABOR, MD  Active   budesonide -formoterol  (SYMBICORT ) 160-4.5 MCG/ACT inhaler [id number] Yes Inhale 2 puffs into the lungs 2 (two) times daily. As needed Domenica Harlene LABOR, MD  Active   busPIRone  (BUSPAR ) 7.5 MG tablet [id number] Yes TAKE ONE TABLET THREE TIMES DAILY AS NEEDED FOR ANXIETY Domenica Harlene LABOR, MD  Active   carvedilol  (COREG ) 25 MG tablet [id number] Yes TAKE ONE TABLET TWICE DAILY WITH MEAL(S)  Patient taking differently: Take 12.5 mg by mouth 2 (two) times daily with a meal.   Domenica Harlene LABOR, MD  Active   cetirizine  (ZYRTEC ) 10 MG tablet [id number] Yes Take 1 tablet (10 mg total) by mouth daily. Domenica Harlene LABOR, MD  Active   cholecalciferol (VITAMIN D3) 25 MCG (1000 UNIT) tablet [id number] Yes Take 2 tablets (2,000 Units total) by mouth daily. Domenica Harlene LABOR, MD  Active   Continuous Glucose Sensor (FREESTYLE LIBRE 3 SENSOR) [place] [id number]  1 each by Does not apply route every 14 (fourteen) days. Use to check  blood glucose continuously. Change sensor every 14 days.  Patient not taking: Reported on 01/22/2024   [provider]  Active            Med Note JUSTINO, MILLING KATHEE Kitchens Apr 14, 2023 11:25 AM) Plan to change to [facility] 3 plus  diltiazem  (CARDIZEM  CD) 180 MG 24 hr capsule [id number] Yes TAKE ONE CAPSULE BY MOUTH DAILY Domenica Harlene LABOR, MD  Active   escitalopram  (LEXAPRO ) 20 MG tablet [id number] Yes 1 tab po qam and 2 tab po pm Domenica Harlene LABOR, MD  Active   esomeprazole  (NEXIUM ) 40 MG capsule [id number] Yes TAKE ONE CAPSULE ONCE DAILY Domenica Harlene LABOR, MD  Active   ezetimibe  (ZETIA ) 10 MG tablet [id number] Yes TAKE ONE TABLET ONCE DAILY Domenica Harlene LABOR, MD  Active   fenofibrate  (TRICOR ) 145 MG tablet [id number]  TAKE ONE TABLET ONCE DAILY  Patient not taking: Reported on 01/13/2024   Domenica Harlene LABOR, MD  Active   ferrous sulfate  325 (65 FE) MG EC tablet [id number] Yes Take 325 mg by mouth every other day. [provider]  Active   fluticasone  (FLONASE ) 50 MCG/ACT nasal spray [id number]  Place 2 sprays into both nostrils daily. Domenica Harlene LABOR, MD  Active   furosemide  (LASIX ) 20 MG tablet [id number] Yes Take 1 tablet (20 mg total) by mouth daily. Domenica Harlene LABOR, MD  Active   glucose blood (ACCU-CHEK AVIVA) test strip [id number]  Twice daily (please use brand that patient insurance covers) Domenica Harlene LABOR, MD  Active   GNP ULTICARE PEN NEEDLES 32G X 4 MM MISC [id number]  AS DIRECTED DAILY Trixie File, MD  Active   hydrALAZINE  (APRESOLINE ) 10 MG tablet [id number] Yes Take 1 tablet (10 mg total) by mouth 3 (three) times daily. Domenica Harlene LABOR, MD  Active   insulin  glargine (LANTUS ) 100 UNIT/ML Solostar Pen [id number] Yes Inject 10 Units into the skin daily. Domenica Harlene LABOR, MD  Active   isosorbide  dinitrate (ISORDIL ) 10 MG tablet [id number] Yes Take 1 tablet (10 mg total) by mouth 3 (three) times daily. Domenica Harlene LABOR, MD  Active   levETIRAcetam  (KEPPRA ) 1000 MG tablet [id number] Yes Take 1  tablet (1,000 mg total) by mouth 2 (two) times daily. Domenica Harlene LABOR, MD  Active   levothyroxine  (SYNTHROID ) 200 MCG tablet [id number]  Take 1 tablet (200 mcg total) by mouth daily before breakfast. Take with a 50mcg tablet daily to equal 250mcg daily Domenica Harlene LABOR, MD  Active   levothyroxine  (SYNTHROID ) 50 MCG tablet [id number] Yes TAKE ONE TABLET ONCE DAILY Domenica Harlene LABOR, MD  Active   losartan  (COZAAR ) 100 MG tablet [id number] Yes Take 1 tablet (100 mg total) by mouth daily. Domenica Harlene LABOR, MD  Active   meclizine  (ANTIVERT ) 25 MG tablet [id number]  Take 1 tablet (25 mg total) by mouth 3 (three) times daily as needed for dizziness. Domenica Harlene LABOR, MD  Active   metFORMIN  (GLUCOPHAGE -XR) 500 MG 24 hr tablet [id number] Yes TAKE 4 TABLETS DAILY WITH SUPPER Domenica Harlene LABOR, MD  Active   montelukast  (SINGULAIR ) 10 MG tablet [id number] Yes TAKE ONE TABLET AT BEDTIME Domenica Harlene LABOR, MD  Active   ondansetron  (ZOFRAN -ODT) 4 MG disintegrating tablet [id number]  Take 1 tablet (4 mg total) by mouth 2 (two) times daily as needed for nausea or vomiting. Domenica Harlene LABOR, MD  Active   potassium chloride  SA (KLOR-CON  M) 20 MEQ tablet [id number] Yes Take 1 tablet (20 mEq total) by mouth 2 (two) times daily. Domenica Harlene LABOR, MD  Active   rosuvastatin  (CRESTOR ) 40 MG tablet [id number] Yes TAKE ONE TABLET DAILY Domenica Harlene LABOR, MD  Active   Semaglutide , 1 MG/DOSE, (OZEMPIC , 1 MG/DOSE,) 4 MG/3ML SOPN [id number]  Inject 1 mg into the skin once a week.  Patient not taking: Reported on 01/22/2024   Trixie File, MD  Active            Med Note JUSTINO, [facility]   Tue Nov 05, 2022  8:59 AM) Approved to get from Novo Nordisk thru 06/24/2023  spironolactone  (ALDACTONE ) 25 MG tablet [id number] Yes Take 1 tablet (25 mg total) by mouth daily. Domenica Harlene LABOR, MD  Active               Assessment/Plan:   Hypertension:Currently uncontrolled per last blood pressure in office but this was from March 2025, most  recent home  blood pressure and heart rate has been low - Continue to check blood pressure and HR over the next week and record. - Recommend to continue hydralazine  10mg  3 times a day, spironolactone , furosemide , diltiazem  and losartan  - Continue lower dose of carvedilol  12.5mg  twice a day   Medication Management: - Currently strategy sufficient to maintain appropriate adherence to prescribed medication regimen - recommended restart using Continuous Glucose Monitor to check blood glucose. Sent in updated Rx. If needed will restart Ozempic  but since she has been off for about 3 months - would recommended starting 0.25mg  weekly and adjust up as tolerated/needed.   Meds ordered this encounter  Medications   Continuous Glucose Sensor (FREESTYLE LIBRE 3 PLUS SENSOR) MISC    Sig: Use to check blood glucose continuously. Change sensor every 15 days.    Dispense:  2 each    Refill:  2     Follow Up Plan: 1 week  Madelin Ray, PharmD Clinical Pharmacist [facility] Primary [facility]  Population Health [phone]

## 2024-01-23 DIAGNOSIS — H9190 Unspecified hearing loss, unspecified ear: Secondary | ICD-10-CM | POA: Diagnosis not present

## 2024-01-23 DIAGNOSIS — I110 Hypertensive heart disease with heart failure: Secondary | ICD-10-CM | POA: Diagnosis not present

## 2024-01-23 DIAGNOSIS — J4520 Mild intermittent asthma, uncomplicated: Secondary | ICD-10-CM | POA: Diagnosis not present

## 2024-01-23 DIAGNOSIS — Z79899 Other long term (current) drug therapy: Secondary | ICD-10-CM | POA: Diagnosis not present

## 2024-01-23 DIAGNOSIS — Z7985 Long-term (current) use of injectable non-insulin antidiabetic drugs: Secondary | ICD-10-CM | POA: Diagnosis not present

## 2024-01-23 DIAGNOSIS — Z794 Long term (current) use of insulin: Secondary | ICD-10-CM | POA: Diagnosis not present

## 2024-01-23 DIAGNOSIS — I509 Heart failure, unspecified: Secondary | ICD-10-CM | POA: Diagnosis not present

## 2024-01-23 DIAGNOSIS — Z556 Problems related to health literacy: Secondary | ICD-10-CM | POA: Diagnosis not present

## 2024-01-23 DIAGNOSIS — Z48811 Encounter for surgical aftercare following surgery on the nervous system: Secondary | ICD-10-CM | POA: Diagnosis not present

## 2024-01-23 DIAGNOSIS — Z96653 Presence of artificial knee joint, bilateral: Secondary | ICD-10-CM | POA: Diagnosis not present

## 2024-01-23 DIAGNOSIS — E785 Hyperlipidemia, unspecified: Secondary | ICD-10-CM | POA: Diagnosis not present

## 2024-01-23 DIAGNOSIS — E890 Postprocedural hypothyroidism: Secondary | ICD-10-CM | POA: Diagnosis not present

## 2024-01-23 DIAGNOSIS — Z853 Personal history of malignant neoplasm of breast: Secondary | ICD-10-CM | POA: Diagnosis not present

## 2024-01-23 DIAGNOSIS — Z7984 Long term (current) use of oral hypoglycemic drugs: Secondary | ICD-10-CM | POA: Diagnosis not present

## 2024-01-23 DIAGNOSIS — Z85850 Personal history of malignant neoplasm of thyroid: Secondary | ICD-10-CM | POA: Diagnosis not present

## 2024-01-23 DIAGNOSIS — E1151 Type 2 diabetes mellitus with diabetic peripheral angiopathy without gangrene: Secondary | ICD-10-CM | POA: Diagnosis not present

## 2024-01-23 DIAGNOSIS — R569 Unspecified convulsions: Secondary | ICD-10-CM | POA: Diagnosis not present

## 2024-01-23 DIAGNOSIS — I7121 Aneurysm of the ascending aorta, without rupture: Secondary | ICD-10-CM | POA: Diagnosis not present

## 2024-01-23 DIAGNOSIS — I2510 Atherosclerotic heart disease of native coronary artery without angina pectoris: Secondary | ICD-10-CM | POA: Diagnosis not present

## 2024-01-23 DIAGNOSIS — G4733 Obstructive sleep apnea (adult) (pediatric): Secondary | ICD-10-CM | POA: Diagnosis not present

## 2024-01-26 DIAGNOSIS — I2510 Atherosclerotic heart disease of native coronary artery without angina pectoris: Secondary | ICD-10-CM | POA: Diagnosis not present

## 2024-01-26 DIAGNOSIS — Z556 Problems related to health literacy: Secondary | ICD-10-CM | POA: Diagnosis not present

## 2024-01-26 DIAGNOSIS — E785 Hyperlipidemia, unspecified: Secondary | ICD-10-CM | POA: Diagnosis not present

## 2024-01-26 DIAGNOSIS — R569 Unspecified convulsions: Secondary | ICD-10-CM | POA: Diagnosis not present

## 2024-01-26 DIAGNOSIS — E1151 Type 2 diabetes mellitus with diabetic peripheral angiopathy without gangrene: Secondary | ICD-10-CM | POA: Diagnosis not present

## 2024-01-26 DIAGNOSIS — E890 Postprocedural hypothyroidism: Secondary | ICD-10-CM | POA: Diagnosis not present

## 2024-01-26 DIAGNOSIS — I7121 Aneurysm of the ascending aorta, without rupture: Secondary | ICD-10-CM | POA: Diagnosis not present

## 2024-01-26 DIAGNOSIS — I509 Heart failure, unspecified: Secondary | ICD-10-CM | POA: Diagnosis not present

## 2024-01-26 DIAGNOSIS — J4520 Mild intermittent asthma, uncomplicated: Secondary | ICD-10-CM | POA: Diagnosis not present

## 2024-01-26 DIAGNOSIS — Z794 Long term (current) use of insulin: Secondary | ICD-10-CM | POA: Diagnosis not present

## 2024-01-26 DIAGNOSIS — Z48811 Encounter for surgical aftercare following surgery on the nervous system: Secondary | ICD-10-CM | POA: Diagnosis not present

## 2024-01-26 DIAGNOSIS — Z7985 Long-term (current) use of injectable non-insulin antidiabetic drugs: Secondary | ICD-10-CM | POA: Diagnosis not present

## 2024-01-26 DIAGNOSIS — Z85850 Personal history of malignant neoplasm of thyroid: Secondary | ICD-10-CM | POA: Diagnosis not present

## 2024-01-26 DIAGNOSIS — Z853 Personal history of malignant neoplasm of breast: Secondary | ICD-10-CM | POA: Diagnosis not present

## 2024-01-26 DIAGNOSIS — I110 Hypertensive heart disease with heart failure: Secondary | ICD-10-CM | POA: Diagnosis not present

## 2024-01-26 DIAGNOSIS — H9190 Unspecified hearing loss, unspecified ear: Secondary | ICD-10-CM | POA: Diagnosis not present

## 2024-01-26 DIAGNOSIS — Z96653 Presence of artificial knee joint, bilateral: Secondary | ICD-10-CM | POA: Diagnosis not present

## 2024-01-26 DIAGNOSIS — Z79899 Other long term (current) drug therapy: Secondary | ICD-10-CM | POA: Diagnosis not present

## 2024-01-26 DIAGNOSIS — G4733 Obstructive sleep apnea (adult) (pediatric): Secondary | ICD-10-CM | POA: Diagnosis not present

## 2024-01-26 DIAGNOSIS — Z7984 Long term (current) use of oral hypoglycemic drugs: Secondary | ICD-10-CM | POA: Diagnosis not present

## 2024-01-29 ENCOUNTER — Other Ambulatory Visit: Payer: Self-pay | Admitting: Pharmacist

## 2024-01-29 DIAGNOSIS — J4520 Mild intermittent asthma, uncomplicated: Secondary | ICD-10-CM | POA: Diagnosis not present

## 2024-01-29 DIAGNOSIS — F32A Depression, unspecified: Secondary | ICD-10-CM

## 2024-01-29 DIAGNOSIS — Z48811 Encounter for surgical aftercare following surgery on the nervous system: Secondary | ICD-10-CM | POA: Diagnosis not present

## 2024-01-29 DIAGNOSIS — E890 Postprocedural hypothyroidism: Secondary | ICD-10-CM | POA: Diagnosis not present

## 2024-01-29 DIAGNOSIS — I7121 Aneurysm of the ascending aorta, without rupture: Secondary | ICD-10-CM | POA: Diagnosis not present

## 2024-01-29 DIAGNOSIS — I509 Heart failure, unspecified: Secondary | ICD-10-CM | POA: Diagnosis not present

## 2024-01-29 DIAGNOSIS — E785 Hyperlipidemia, unspecified: Secondary | ICD-10-CM | POA: Diagnosis not present

## 2024-01-29 DIAGNOSIS — F419 Anxiety disorder, unspecified: Secondary | ICD-10-CM

## 2024-01-29 DIAGNOSIS — I2510 Atherosclerotic heart disease of native coronary artery without angina pectoris: Secondary | ICD-10-CM | POA: Diagnosis not present

## 2024-01-29 DIAGNOSIS — I110 Hypertensive heart disease with heart failure: Secondary | ICD-10-CM | POA: Diagnosis not present

## 2024-01-29 DIAGNOSIS — E1151 Type 2 diabetes mellitus with diabetic peripheral angiopathy without gangrene: Secondary | ICD-10-CM | POA: Diagnosis not present

## 2024-01-29 DIAGNOSIS — R569 Unspecified convulsions: Secondary | ICD-10-CM | POA: Diagnosis not present

## 2024-01-29 NOTE — Progress Notes (Signed)
 01/13/2024 Name: [name] MRN: [id number] DOB: 13-Feb-1951  Chief Complaint  Patient presents with   Diabetes   Hypertension   Medication Management    [name] is a 73 y.o. year old female who presented for a telephone visit.  They were referred to the pharmacist by their PCP for assistance in managing diabetes, medication access, and complex medication management.    Subjective: [name] has recently has brain surgery and due to abscess she had an extended post surgical stay in rehab for IV antibiotic therapy. She was initally at [facility] in [place] but was later moved to [facility].    Her medications were being provided by [facility] while she was in rehab. There was some difficulty getting her home medications when she returned home because the rehab facility's pharmacy had already filled her medications 7/17 but she was discharged from facility 7/9. Daughter was able to get medication from [facility] and she has been taking her medications regularly. She is using weekly pill containers for adherence.  Patient has a Comptroller and  family that helps her remember to take her meds 3 times per day.   Medication Access/Adherence  Current Pharmacy:  [facility] [place], [place] - [address] [place] [address] Phone: [phone] Fax: [phone]  [id number] [place], [place] - [address] [place] [address] Phone: [phone] Fax: [phone]   Patient reports affordability concerns with their medications: No  - She has received Ozempic  from [facility] in the past but per recent office notes Ozempic  was stopped in March 2025 - I am wondering if it was held prior to surgery and has not been restarted yet.   Patient reports access/transportation concerns to their pharmacy: No  - family provides transportation and her pharmacy delivers meds to her.   Patient reports adherence concerns with their medications:  No      Diabetes:  Current medications: Lantus  at bedtime, metformin  XR 500mg  twice a day  Medications tried in the past: Ozempic  - stopped in March 2025 - suspect was held prior to surgery per GLP protocol  Patient recently restarted using Libre 3 plus Continuous Glucose Monitor.   CGM Documentation:  Days Worn: 8 (recommend 14 days) % Time CGM is active: 49% (goal >=70%) Average Glucose: 122 mg/dL Glucose Management Indicator: 6.2% Glucose Variability: 19.6% (goal <36%) Time in Range:  - Time above range >250: 0% (typical goal: <5%) - Time above range 181-250: 1% (typical goal <20%) - Time in range 70-180 99% (typical goal >=70%) - Time below range 54-69: 0% (typical goal <4%) - Time below range: 0 (typical goal <1%)         Hypertension / CHF  Current medications: diltiazem  180mg  daily and furosemide  20mg  daily, hydralazine  10mg  3 times a day (every 8 hours), losartan  100mg  daily, spironolactone  25mg  daily, carvedilol  25mg  - dose was lowered to take 0.5 tablet = 12.5mg  twice a day per MyChart message 07/33/2025,   Medications previously tried: Hydralazine  100mg  3 times a day was on her medication list at discharge from the hospital but per refill records from [facility] she has been taking hydralazine  10mg  3 times a day since Oct 23, 2023.  Patient has a validated, automated, upper arm home BP cuff and home health has been checking blood pressure. Recent blood pressure readings:  01/28/2024 blood pressure was 118/75. She did not have  other readings available but reports SBP has not been < 100 or DBP < 60 over the last week.   01/20/2024 blood pressure was 95/58; waited a few minutes and was 120/58 At 3pm blood pressure was 124/64 and HR in the 50's  01/21/2024 - blood pressure was 134/58 and HR 57 - Dr [name] recommended lowering carvedilol  dose to 0.5 tablet   Patient states she was told to have labs  checked 1 month after her last PCP visit. She was expecting her usual every 3 months labs like - A1c, lipids, CMP but the only order I see if for vitamin D  level.   Objective:  BP Readings from Last 3 Encounters:  01/21/24 124/64  09/12/23 (!) 148/91  09/11/23 (!) 198/98     Lab Results  Component Value Date   HGBA1C 7 09/04/2023    Lab Results  Component Value Date   CREATININE 0.70 09/11/2023   CREATININE 0.79 09/11/2023   BUN 16 09/11/2023   BUN 14 09/11/2023   NA 137 09/11/2023   NA 136 09/11/2023   K 3.9 09/11/2023   K 3.9 09/11/2023   CL 102 09/11/2023   CL 102 09/11/2023   CO2 23 09/11/2023    Lab Results  Component Value Date   CHOL 114 07/17/2023   HDL 40.10 07/17/2023   LDLCALC 38 07/17/2023   LDLDIRECT 66.0 10/24/2022   TRIG 179.0 (H) 07/17/2023   CHOLHDL 3 07/17/2023    Medications Reviewed Today     Reviewed by [name], RPH-CPP (Pharmacist) on 01/29/24 at [phone]  Med List Status: <None>   Medication Order Taking? Sig Documenting Provider Last Dose Status Informant  Accu-Chek Softclix Lancets lancets [id number] Yes Use to check blood glucose up to twice a day (Dx: Type 2 DM with long term insulin  use E11.57, Z79.4) [name] LABOR, MD  Active   acetaminophen  (TYLENOL ) 500 MG tablet [id number] Yes Take 1,000 mg by mouth 2 (two) times daily as needed for moderate pain or headache. [provider]  Active Self  albuterol  (VENTOLIN  HFA) 108 (90 Base) MCG/ACT inhaler [id number]  Inhale 2 puffs every 4-6 hours as needed - rescue. Neysa Rama D, MD  Active   blood glucose meter kit and supplies [id number]  Dispense based on patient and insurance preference. Use up to four times daily as directed. (FOR ICD-10 E10.9, E11.9).  Patient not taking: Reported on 01/29/2024   [name] LABOR, MD  Active   budesonide -formoterol  (SYMBICORT ) 160-4.5 MCG/ACT inhaler [id number] Yes Inhale 2 puffs into the lungs 2 (two) times daily. As needed [name] LABOR, MD   Active   busPIRone  (BUSPAR ) 7.5 MG tablet [id number]  TAKE ONE TABLET THREE TIMES DAILY AS NEEDED FOR ANXIETY [name] LABOR, MD  Active   carvedilol  (COREG ) 25 MG tablet [id number] Yes TAKE ONE TABLET TWICE DAILY WITH MEAL(S)  Patient taking differently: Take 12.5 mg by mouth 2 (two) times daily with a meal.   [name] LABOR, MD  Active   cetirizine  (ZYRTEC ) 10 MG tablet [id number] Yes Take 1 tablet (10 mg total) by mouth daily. [name] LABOR, MD  Active   cholecalciferol (VITAMIN D3) 25 MCG (1000 UNIT) tablet [id number] Yes Take 2 tablets (2,000 Units total) by mouth daily. [name] LABOR, MD  Active   Continuous Glucose Sensor (FREESTYLE LIBRE 3 PLUS SENSOR) [place] [id number] Yes Use to check blood glucose continuously. Change sensor every 15 days. [name] LABOR, MD  Active   diltiazem  (CARDIZEM  CD)  180 MG 24 hr capsule [id number] Yes TAKE ONE CAPSULE BY MOUTH DAILY [name] LABOR, MD  Active   escitalopram  (LEXAPRO ) 20 MG tablet [id number] Yes 1 tab po qam and 2 tab po pm [name] LABOR, MD  Active   esomeprazole  (NEXIUM ) 40 MG capsule [id number]  TAKE ONE CAPSULE ONCE DAILY [name] LABOR, MD  Active   ezetimibe  (ZETIA ) 10 MG tablet [id number] Yes TAKE ONE TABLET ONCE DAILY [name] LABOR, MD  Active   fenofibrate  (TRICOR ) 145 MG tablet [id number] Yes TAKE ONE TABLET ONCE DAILY [name] LABOR, MD  Active   ferrous sulfate  325 (65 FE) MG EC tablet [id number] Yes Take 325 mg by mouth every other day. [provider]  Active   fluticasone  (FLONASE ) 50 MCG/ACT nasal spray [id number]  Place 2 sprays into both nostrils daily. [name] LABOR, MD  Active   furosemide  (LASIX ) 20 MG tablet [id number] Yes Take 1 tablet (20 mg total) by mouth daily. [name] LABOR, MD  Active   glucose blood (ACCU-CHEK AVIVA) test strip [id number]  Twice daily (please use brand that patient insurance covers) [name] LABOR, MD  Active   GNP ULTICARE PEN NEEDLES 32G X 4 MM MISC [id number] Yes AS DIRECTED DAILY  Trixie File, MD  Active   hydrALAZINE  (APRESOLINE ) 10 MG tablet [id number] Yes Take 1 tablet (10 mg total) by mouth 3 (three) times daily. [name] LABOR, MD  Active   insulin  glargine (LANTUS ) 100 UNIT/ML Solostar Pen [id number] Yes Inject 10 Units into the skin daily. [name] LABOR, MD  Active   isosorbide  dinitrate (ISORDIL ) 10 MG tablet [id number] Yes Take 1 tablet (10 mg total) by mouth 3 (three) times daily. [name] LABOR, MD  Active   levETIRAcetam  (KEPPRA ) 1000 MG tablet [id number] Yes Take 1 tablet (1,000 mg total) by mouth 2 (two) times daily. [name] LABOR, MD  Active   levothyroxine  (SYNTHROID ) 200 MCG tablet [id number] Yes Take 1 tablet (200 mcg total) by mouth daily before breakfast. Take with a 50mcg tablet daily to equal 250mcg daily [name] LABOR, MD  Active   levothyroxine  (SYNTHROID ) 50 MCG tablet [id number] Yes TAKE ONE TABLET ONCE DAILY [name] LABOR, MD  Active   losartan  (COZAAR ) 100 MG tablet [id number] Yes Take 1 tablet (100 mg total) by mouth daily. [name] LABOR, MD  Active   meclizine  (ANTIVERT ) 25 MG tablet [id number] Yes Take 1 tablet (25 mg total) by mouth 3 (three) times daily as needed for dizziness. [name] LABOR, MD  Active   metFORMIN  (GLUCOPHAGE -XR) 500 MG 24 hr tablet [id number] Yes TAKE 4 TABLETS DAILY WITH SUPPER [name] LABOR, MD  Active   montelukast  (SINGULAIR ) 10 MG tablet [id number] Yes TAKE ONE TABLET AT BEDTIME [name] LABOR, MD  Active   ondansetron  (ZOFRAN -ODT) 4 MG disintegrating tablet [id number] Yes Take 1 tablet (4 mg total) by mouth 2 (two) times daily as needed for nausea or vomiting. [name] LABOR, MD  Active   potassium chloride  SA (KLOR-CON  M) 20 MEQ tablet [id number] Yes Take 1 tablet (20 mEq total) by mouth 2 (two) times daily. [name] LABOR, MD  Active   rosuvastatin  (CRESTOR ) 40 MG tablet [id number] Yes TAKE ONE TABLET DAILY [name] LABOR, MD  Active   Semaglutide , 1 MG/DOSE, (OZEMPIC , 1 MG/DOSE,) 4 MG/3ML SOPN [id number]   Inject 1 mg into the skin once a week.  Patient not taking: Reported on 01/29/2024   Trixie File, MD  Active            Med Note JUSTINO [name] B   Tue Nov 05, 2022  8:59 AM) Approved to get from Novo Nordisk thru 06/24/2023  spironolactone  (ALDACTONE ) 25 MG tablet [id number] Yes Take 1 tablet (25 mg total) by mouth daily. [name] LABOR, MD  Active               Assessment/Plan:    Diabetes: Currently at goals per Continuous Glucose Monitor report - Recommend continue Lantus  at current dose. If she decides to restart Ozempic , would recommend stopping Lantus  and start at lowest Ozempic  dose 0.25mg  weekly for 4 weeks.  - Continue metformin  twice a day - Continue to use Continuous Glucose Monitor - Libre 3 plus sensors to check blood glucose continuously  Hypertension:Currently controlled  - Continue to check blood pressure and HR over the next week and record. - Recommend to continue hydralazine  10mg  3 times a day, spironolactone , furosemide , diltiazem  and losartan  - Continue lower dose of carvedilol  12.5mg  twice a day  Medication Management: - Currently strategy sufficient to maintain appropriate adherence to prescribed medication regimen - recommended restart using Continuous Glucose Monitor to check blood glucose. Sent in updated Rx. If needed will restart Ozempic  but since she has been off for about 3 months - would recommended .   Will ask PCP about labs orders and appointment.  Follow Up Plan: 3 to 4 weeks  [name] Ray, PharmD Clinical Pharmacist [facility] Primary Care  Population Health [phone]

## 2024-02-02 ENCOUNTER — Other Ambulatory Visit: Payer: Self-pay | Admitting: Family Medicine

## 2024-02-02 ENCOUNTER — Telehealth: Payer: Self-pay

## 2024-02-02 DIAGNOSIS — Z1231 Encounter for screening mammogram for malignant neoplasm of breast: Secondary | ICD-10-CM

## 2024-02-02 MED ORDER — LEVETIRACETAM 1000 MG PO TABS: 1000.0000 mg | ORAL_TABLET | Freq: Two times a day (BID) | ORAL | 1 refills | Status: DC

## 2024-02-02 NOTE — Telephone Encounter (Signed)
 Copied from CRM [id number]. Topic: Clinical - Medication Question >> Feb 02, 2024 10:15 AM Frederich PARAS wrote: Reason for CRM: rhonda,  from [facility] is calling to adv pt wants to get filled for keppra  but she doesn't see that it was ever prescribed for the pt. Shona wants to know if that should be prescribed or not, and if so dr. Domenica would need to send a prescription for it along with dosage amounts because she has none of that information.Shona says  PT adv that she was on it while in longterm care >> Feb 02, 2024 10:22 AM Frederich PARAS wrote: [phone]  callback # for Shona at [facility] homecare

## 2024-02-03 ENCOUNTER — Telehealth: Payer: Self-pay

## 2024-02-03 NOTE — Telephone Encounter (Signed)
 Called patient and scheduled lab visit and orders are already in for the patient.   Copied from CRM [id number]. Topic: Clinical - Request for Lab/Test Order >> Feb 02, 2024  4:31 PM Harlene ORN wrote: Reason for CRM: Daughter called. Requesting to have a lab appointment for her mother. Please call the daughter when the orders are activated.

## 2024-02-04 ENCOUNTER — Other Ambulatory Visit: Payer: Self-pay | Admitting: Neurological Surgery

## 2024-02-04 DIAGNOSIS — G9600 Cerebrospinal fluid leak, unspecified: Secondary | ICD-10-CM

## 2024-02-06 DIAGNOSIS — Z556 Problems related to health literacy: Secondary | ICD-10-CM | POA: Diagnosis not present

## 2024-02-06 DIAGNOSIS — J4520 Mild intermittent asthma, uncomplicated: Secondary | ICD-10-CM | POA: Diagnosis not present

## 2024-02-06 DIAGNOSIS — E1151 Type 2 diabetes mellitus with diabetic peripheral angiopathy without gangrene: Secondary | ICD-10-CM | POA: Diagnosis not present

## 2024-02-06 DIAGNOSIS — Z85850 Personal history of malignant neoplasm of thyroid: Secondary | ICD-10-CM | POA: Diagnosis not present

## 2024-02-06 DIAGNOSIS — I509 Heart failure, unspecified: Secondary | ICD-10-CM | POA: Diagnosis not present

## 2024-02-06 DIAGNOSIS — E785 Hyperlipidemia, unspecified: Secondary | ICD-10-CM | POA: Diagnosis not present

## 2024-02-06 DIAGNOSIS — H9190 Unspecified hearing loss, unspecified ear: Secondary | ICD-10-CM | POA: Diagnosis not present

## 2024-02-06 DIAGNOSIS — Z853 Personal history of malignant neoplasm of breast: Secondary | ICD-10-CM | POA: Diagnosis not present

## 2024-02-06 DIAGNOSIS — R569 Unspecified convulsions: Secondary | ICD-10-CM | POA: Diagnosis not present

## 2024-02-06 DIAGNOSIS — Z7985 Long-term (current) use of injectable non-insulin antidiabetic drugs: Secondary | ICD-10-CM | POA: Diagnosis not present

## 2024-02-06 DIAGNOSIS — Z48811 Encounter for surgical aftercare following surgery on the nervous system: Secondary | ICD-10-CM | POA: Diagnosis not present

## 2024-02-06 DIAGNOSIS — Z96653 Presence of artificial knee joint, bilateral: Secondary | ICD-10-CM | POA: Diagnosis not present

## 2024-02-06 DIAGNOSIS — G4733 Obstructive sleep apnea (adult) (pediatric): Secondary | ICD-10-CM | POA: Diagnosis not present

## 2024-02-06 DIAGNOSIS — E890 Postprocedural hypothyroidism: Secondary | ICD-10-CM | POA: Diagnosis not present

## 2024-02-06 DIAGNOSIS — Z794 Long term (current) use of insulin: Secondary | ICD-10-CM | POA: Diagnosis not present

## 2024-02-06 DIAGNOSIS — Z7984 Long term (current) use of oral hypoglycemic drugs: Secondary | ICD-10-CM | POA: Diagnosis not present

## 2024-02-06 DIAGNOSIS — Z79899 Other long term (current) drug therapy: Secondary | ICD-10-CM | POA: Diagnosis not present

## 2024-02-06 DIAGNOSIS — I110 Hypertensive heart disease with heart failure: Secondary | ICD-10-CM | POA: Diagnosis not present

## 2024-02-06 DIAGNOSIS — I7121 Aneurysm of the ascending aorta, without rupture: Secondary | ICD-10-CM | POA: Diagnosis not present

## 2024-02-06 DIAGNOSIS — I2510 Atherosclerotic heart disease of native coronary artery without angina pectoris: Secondary | ICD-10-CM | POA: Diagnosis not present

## 2024-02-09 DIAGNOSIS — Z48811 Encounter for surgical aftercare following surgery on the nervous system: Secondary | ICD-10-CM | POA: Diagnosis not present

## 2024-02-09 DIAGNOSIS — I110 Hypertensive heart disease with heart failure: Secondary | ICD-10-CM | POA: Diagnosis not present

## 2024-02-09 DIAGNOSIS — R569 Unspecified convulsions: Secondary | ICD-10-CM | POA: Diagnosis not present

## 2024-02-09 DIAGNOSIS — E890 Postprocedural hypothyroidism: Secondary | ICD-10-CM | POA: Diagnosis not present

## 2024-02-09 DIAGNOSIS — G4733 Obstructive sleep apnea (adult) (pediatric): Secondary | ICD-10-CM | POA: Diagnosis not present

## 2024-02-09 DIAGNOSIS — I2510 Atherosclerotic heart disease of native coronary artery without angina pectoris: Secondary | ICD-10-CM | POA: Diagnosis not present

## 2024-02-09 DIAGNOSIS — J4520 Mild intermittent asthma, uncomplicated: Secondary | ICD-10-CM | POA: Diagnosis not present

## 2024-02-09 DIAGNOSIS — I509 Heart failure, unspecified: Secondary | ICD-10-CM | POA: Diagnosis not present

## 2024-02-09 DIAGNOSIS — H9190 Unspecified hearing loss, unspecified ear: Secondary | ICD-10-CM | POA: Diagnosis not present

## 2024-02-09 DIAGNOSIS — E1151 Type 2 diabetes mellitus with diabetic peripheral angiopathy without gangrene: Secondary | ICD-10-CM | POA: Diagnosis not present

## 2024-02-09 DIAGNOSIS — Z85850 Personal history of malignant neoplasm of thyroid: Secondary | ICD-10-CM | POA: Diagnosis not present

## 2024-02-09 DIAGNOSIS — Z79899 Other long term (current) drug therapy: Secondary | ICD-10-CM | POA: Diagnosis not present

## 2024-02-09 DIAGNOSIS — Z7985 Long-term (current) use of injectable non-insulin antidiabetic drugs: Secondary | ICD-10-CM | POA: Diagnosis not present

## 2024-02-09 DIAGNOSIS — I7121 Aneurysm of the ascending aorta, without rupture: Secondary | ICD-10-CM | POA: Diagnosis not present

## 2024-02-09 DIAGNOSIS — Z853 Personal history of malignant neoplasm of breast: Secondary | ICD-10-CM | POA: Diagnosis not present

## 2024-02-09 DIAGNOSIS — Z556 Problems related to health literacy: Secondary | ICD-10-CM | POA: Diagnosis not present

## 2024-02-09 DIAGNOSIS — E785 Hyperlipidemia, unspecified: Secondary | ICD-10-CM | POA: Diagnosis not present

## 2024-02-09 DIAGNOSIS — Z794 Long term (current) use of insulin: Secondary | ICD-10-CM | POA: Diagnosis not present

## 2024-02-09 DIAGNOSIS — Z7984 Long term (current) use of oral hypoglycemic drugs: Secondary | ICD-10-CM | POA: Diagnosis not present

## 2024-02-09 DIAGNOSIS — Z96653 Presence of artificial knee joint, bilateral: Secondary | ICD-10-CM | POA: Diagnosis not present

## 2024-02-11 ENCOUNTER — Other Ambulatory Visit (INDEPENDENT_AMBULATORY_CARE_PROVIDER_SITE_OTHER)

## 2024-02-11 ENCOUNTER — Ambulatory Visit: Payer: Self-pay | Admitting: Family Medicine

## 2024-02-11 ENCOUNTER — Other Ambulatory Visit

## 2024-02-11 DIAGNOSIS — E1165 Type 2 diabetes mellitus with hyperglycemia: Secondary | ICD-10-CM | POA: Diagnosis not present

## 2024-02-11 DIAGNOSIS — I10 Essential (primary) hypertension: Secondary | ICD-10-CM | POA: Diagnosis not present

## 2024-02-11 DIAGNOSIS — E039 Hypothyroidism, unspecified: Secondary | ICD-10-CM

## 2024-02-11 DIAGNOSIS — E559 Vitamin D deficiency, unspecified: Secondary | ICD-10-CM

## 2024-02-11 DIAGNOSIS — E7800 Pure hypercholesterolemia, unspecified: Secondary | ICD-10-CM | POA: Diagnosis not present

## 2024-02-11 LAB — COMPREHENSIVE METABOLIC PANEL WITH GFR
ALT: 20 U/L (ref 0–35)
AST: 18 U/L (ref 0–37)
Albumin: 3.9 g/dL (ref 3.5–5.2)
Alkaline Phosphatase: 74 U/L (ref 39–117)
BUN: 16 mg/dL (ref 6–23)
CO2: 29 meq/L (ref 19–32)
Calcium: 9.5 mg/dL (ref 8.4–10.5)
Chloride: 103 meq/L (ref 96–112)
Creatinine, Ser: 0.72 mg/dL (ref 0.40–1.20)
GFR: 83.06 mL/min (ref >60.00)
Glucose, Bld: 149 mg/dL — ABNORMAL HIGH (ref 70–99)
Potassium: 4.3 meq/L (ref 3.5–5.1)
Sodium: 142 meq/L (ref 135–145)
Total Bilirubin: 0.4 mg/dL (ref 0.2–1.2)
Total Protein: 6.5 g/dL (ref 6.0–8.3)

## 2024-02-11 LAB — CBC WITH DIFFERENTIAL/PLATELET
Basophils Absolute: 0 K/uL (ref 0.0–0.1)
Basophils Relative: 0.2 % (ref 0.0–3.0)
Eosinophils Absolute: 0 K/uL (ref 0.0–0.7)
Eosinophils Relative: 0.7 % (ref 0.0–5.0)
HCT: 35.7 % — ABNORMAL LOW (ref 36.0–46.0)
Hemoglobin: 11.2 g/dL — ABNORMAL LOW (ref 12.0–15.0)
Lymphocytes Relative: 25.4 % (ref 12.0–46.0)
Lymphs Abs: 1.3 K/uL (ref 0.7–4.0)
MCHC: 31.5 g/dL (ref 30.0–36.0)
MCV: 84.1 fl (ref 78.0–100.0)
Monocytes Absolute: 0.3 K/uL (ref 0.1–1.0)
Monocytes Relative: 6 % (ref 3.0–12.0)
Neutro Abs: 3.5 K/uL (ref 1.4–7.7)
Neutrophils Relative %: 67.7 % (ref 43.0–77.0)
Platelets: 195 K/uL (ref 150.0–400.0)
RBC: 4.25 Mil/uL (ref 3.87–5.11)
RDW: 16.2 % — ABNORMAL HIGH (ref 11.5–15.5)
WBC: 5.2 K/uL (ref 4.0–10.5)

## 2024-02-11 LAB — HEMOGLOBIN A1C: Hgb A1c MFr Bld: 7 % — ABNORMAL HIGH (ref 4.6–6.5)

## 2024-02-11 LAB — TSH: TSH: 0.03 u[IU]/mL — ABNORMAL LOW (ref 0.35–5.50)

## 2024-02-11 LAB — LIPID PANEL
Cholesterol: 103 mg/dL (ref 0–200)
HDL: 31.2 mg/dL — ABNORMAL LOW (ref >39.00)
LDL Cholesterol: 36 mg/dL (ref 0–99)
NonHDL: 71.32
Total CHOL/HDL Ratio: 3
Triglycerides: 179 mg/dL — ABNORMAL HIGH (ref 0.0–149.0)
VLDL: 35.8 mg/dL (ref 0.0–40.0)

## 2024-02-11 LAB — MICROALBUMIN / CREATININE URINE RATIO
Creatinine,U: 75.6 mg/dL
Microalb Creat Ratio: 24.3 mg/g (ref 0.0–30.0)
Microalb, Ur: 1.8 mg/dL (ref 0.0–1.9)

## 2024-02-11 LAB — VITAMIN D 25 HYDROXY (VIT D DEFICIENCY, FRACTURES): VITD: 25.5 ng/mL — ABNORMAL LOW (ref 30.00–100.00)

## 2024-02-16 ENCOUNTER — Ambulatory Visit

## 2024-02-20 ENCOUNTER — Telehealth: Payer: Self-pay | Admitting: Pharmacist

## 2024-02-20 ENCOUNTER — Other Ambulatory Visit: Payer: Self-pay | Admitting: Pharmacist

## 2024-02-20 NOTE — Telephone Encounter (Signed)
 Attempt was made to contact patient by phone today for follow up by Clinical Pharmacist regarding medication management.  Unable to reach patient. LM on VM with my contact number [phone] or [phone] (for today 8/29).

## 2024-02-24 ENCOUNTER — Ambulatory Visit

## 2024-02-24 ENCOUNTER — Ambulatory Visit
Admission: RE | Admit: 2024-02-24 | Discharge: 2024-02-24 | Disposition: A | Discharge status: Home / Self Care | Source: Ambulatory Visit | Attending: Family Medicine | Admitting: Family Medicine

## 2024-02-24 ENCOUNTER — Encounter: Payer: Self-pay | Admitting: Family

## 2024-02-24 DIAGNOSIS — Z1231 Encounter for screening mammogram for malignant neoplasm of breast: Secondary | ICD-10-CM | POA: Diagnosis not present

## 2024-02-24 HISTORY — DX: Personal history of antineoplastic chemotherapy: Z92.21

## 2024-02-24 HISTORY — DX: Personal history of irradiation: Z92.3

## 2024-03-02 ENCOUNTER — Telehealth: Payer: Self-pay

## 2024-03-02 ENCOUNTER — Other Ambulatory Visit: Payer: Self-pay | Admitting: Family Medicine

## 2024-03-02 NOTE — Telephone Encounter (Signed)
 Received a refill reorder form from [facility]  fill and fax to provider office to sign and date can be fax back to [facility] or to [phone].

## 2024-03-04 ENCOUNTER — Ambulatory Visit
Admission: RE | Admit: 2024-03-04 | Discharge: 2024-03-04 | Disposition: A | Discharge status: Home / Self Care | Source: Ambulatory Visit | Attending: Neurological Surgery | Admitting: Neurological Surgery

## 2024-03-04 DIAGNOSIS — G9600 Cerebrospinal fluid leak, unspecified: Secondary | ICD-10-CM

## 2024-03-04 DIAGNOSIS — G9389 Other specified disorders of brain: Secondary | ICD-10-CM | POA: Diagnosis not present

## 2024-03-04 MED ORDER — GADOPICLENOL 0.5 MMOL/ML IV SOLN
10.0000 mL | Freq: Once | INTRAVENOUS | Status: AC | PRN
  Administered 2024-03-04: 10 mL via INTRAVENOUS

## 2024-03-25 ENCOUNTER — Other Ambulatory Visit (INDEPENDENT_AMBULATORY_CARE_PROVIDER_SITE_OTHER)

## 2024-03-25 ENCOUNTER — Telehealth: Payer: Self-pay | Admitting: *Deleted

## 2024-03-25 ENCOUNTER — Ambulatory Visit (INDEPENDENT_AMBULATORY_CARE_PROVIDER_SITE_OTHER)

## 2024-03-25 ENCOUNTER — Ambulatory Visit: Payer: Self-pay | Admitting: Family Medicine

## 2024-03-25 DIAGNOSIS — E039 Hypothyroidism, unspecified: Secondary | ICD-10-CM

## 2024-03-25 DIAGNOSIS — Z23 Encounter for immunization: Secondary | ICD-10-CM | POA: Diagnosis not present

## 2024-03-25 DIAGNOSIS — M810 Age-related osteoporosis without current pathological fracture: Secondary | ICD-10-CM

## 2024-03-25 LAB — TSH: TSH: 0.03 u[IU]/mL — ABNORMAL LOW (ref 0.35–5.50)

## 2024-03-25 NOTE — Telephone Encounter (Signed)
 Spoke with pt daughter and they would like to rerun Prolia .  Will rerun.

## 2024-03-26 ENCOUNTER — Telehealth: Payer: Self-pay

## 2024-03-26 ENCOUNTER — Other Ambulatory Visit (HOSPITAL_COMMUNITY): Payer: Self-pay

## 2024-03-26 DIAGNOSIS — G9600 Cerebrospinal fluid leak, unspecified: Secondary | ICD-10-CM | POA: Diagnosis not present

## 2024-03-26 MED ORDER — DENOSUMAB 60 MG/ML ~~LOC~~ SOSY
60.0000 mg | PREFILLED_SYRINGE | SUBCUTANEOUS | Status: AC
  Administered 2024-04-08: 60 mg via SUBCUTANEOUS

## 2024-03-26 MED ORDER — LEVOTHYROXINE SODIUM 200 MCG PO TABS: 200.0000 ug | ORAL_TABLET | Freq: Every day | ORAL | 0 refills | Status: DC

## 2024-03-26 NOTE — Telephone Encounter (Signed)
 Prolia VOB initiated via [email or web address]  Next Prolia inj DUE: NOW

## 2024-03-26 NOTE — Telephone Encounter (Signed)
 SABRA

## 2024-03-26 NOTE — Telephone Encounter (Signed)
 Pt ready for scheduling for PROLIA  on or after : 03/26/24  Option# 1: Buy/Bill (Office supplied medication)  Out-of-pocket cost due at time of clinic visit: $0  Number of injection/visits approved: 2  Primary: UHC MEDICARE Prolia  co-insurance: 0% Admin fee co-insurance: 0%  Secondary: --- Prolia  co-insurance:  Admin fee co-insurance:   Medical Benefit Details: Date Benefits were checked: 03/26/24 Deductible: NO/ Coinsurance: 0%/ Admin Fee: 0%  Prior Auth: APPROVED PA# [id number] Expiration Date: 04/24/23-04/23/24  # of doses approved: 2 ----------------------------------------------------------------------- Option# 2- Med Obtained from pharmacy: Prolia  is no longer preferred for pharmacy benefit. Jubbonti is now preferred. PRICING IS FOR JUBBONTI  Pharmacy benefit: Copay $201.81 (Paid to pharmacy) Admin Fee: 0% (Pay at clinic)  Prior Auth: N/A PA# Expiration Date:   # of doses approved:   If patient wants fill through the pharmacy benefit please send prescription to: ---, and include estimated need by date in rx notes. Pharmacy will ship medication directly to the office.  Patient NOT eligible for Prolia  Copay Card. Copay Card can make patient's cost as little as $25. Link to apply: [email or web address]://www.amgensupportplus.[email or web address]/copay  ** This summary of benefits is an estimation of the patient's out-of-pocket cost. Exact cost may very based on individual plan coverage.

## 2024-03-27 ENCOUNTER — Other Ambulatory Visit: Payer: Self-pay | Admitting: Family Medicine

## 2024-03-31 ENCOUNTER — Other Ambulatory Visit: Payer: Self-pay | Admitting: Internal Medicine

## 2024-04-01 ENCOUNTER — Other Ambulatory Visit: Payer: Self-pay | Admitting: Family Medicine

## 2024-04-08 ENCOUNTER — Ambulatory Visit (INDEPENDENT_AMBULATORY_CARE_PROVIDER_SITE_OTHER): Admitting: *Deleted

## 2024-04-08 DIAGNOSIS — M810 Age-related osteoporosis without current pathological fracture: Secondary | ICD-10-CM

## 2024-04-08 MED ORDER — DENOSUMAB 60 MG/ML ~~LOC~~ SOSY: 60.0000 mg | PREFILLED_SYRINGE | SUBCUTANEOUS | Status: AC

## 2024-04-08 NOTE — Progress Notes (Signed)
 Patient here for Prolia  injection per physicians orders  Prolia  60 mg SQ , was administered left arm today. Patient tolerated injection.  Patient next injection due: 6 months, appt made:  No- will schedule in 5 months after benefits are ran again  Initial injection: no  Did Prolia  come from pharmacy (if yes please select patient supplied): no  Cam placed for next injection: yes

## 2024-04-24 ENCOUNTER — Other Ambulatory Visit: Payer: Self-pay | Admitting: Family Medicine

## 2024-05-15 ENCOUNTER — Encounter (HOSPITAL_BASED_OUTPATIENT_CLINIC_OR_DEPARTMENT_OTHER): Payer: Self-pay | Admitting: Pharmacist

## 2024-05-15 NOTE — Progress Notes (Signed)
 Pharmacy Quality Measure Review  This patient is appearing on a report for being at risk of failing the adherence measure for hypertension (ACEi/ARB) medications this calendar year.   Medication: losartan  Last fill date: 01/09/2024 for 28 day supply  Reviewed recent refill history in Dr Annemarie database. Actual last refill date was 04/26/2024 for 90 day supply. Patient has 1 refill remaining. Next appointment with PCP is 08/09/2024.    Insurance report was not up to date. No action needed at this time.    Madelin Ray, PharmD Clinical Pharmacist [facility]  Population Health [phone]

## 2024-05-18 ENCOUNTER — Other Ambulatory Visit: Payer: Self-pay | Admitting: Family Medicine

## 2024-05-26 ENCOUNTER — Other Ambulatory Visit: Payer: Self-pay | Admitting: Family Medicine

## 2024-05-27 ENCOUNTER — Encounter: Payer: Self-pay | Admitting: Family Medicine

## 2024-05-27 ENCOUNTER — Encounter: Payer: Self-pay | Admitting: Family

## 2024-05-31 ENCOUNTER — Encounter: Payer: Self-pay | Admitting: Family

## 2024-05-31 ENCOUNTER — Ambulatory Visit: Admitting: Family

## 2024-05-31 VITALS — BP 112/72 | HR 57 | Temp 97.2°F | Resp 16 | Ht 66.0 in | Wt 273.0 lb

## 2024-05-31 DIAGNOSIS — I5032 Chronic diastolic (congestive) heart failure: Secondary | ICD-10-CM

## 2024-05-31 DIAGNOSIS — J029 Acute pharyngitis, unspecified: Secondary | ICD-10-CM

## 2024-05-31 DIAGNOSIS — E1165 Type 2 diabetes mellitus with hyperglycemia: Secondary | ICD-10-CM

## 2024-05-31 DIAGNOSIS — J020 Streptococcal pharyngitis: Secondary | ICD-10-CM

## 2024-05-31 DIAGNOSIS — Z7409 Other reduced mobility: Secondary | ICD-10-CM

## 2024-05-31 DIAGNOSIS — Z7985 Long-term (current) use of injectable non-insulin antidiabetic drugs: Secondary | ICD-10-CM | POA: Diagnosis not present

## 2024-05-31 DIAGNOSIS — I10 Essential (primary) hypertension: Secondary | ICD-10-CM

## 2024-05-31 DIAGNOSIS — G060 Intracranial abscess and granuloma: Secondary | ICD-10-CM

## 2024-05-31 LAB — COMPREHENSIVE METABOLIC PANEL WITH GFR
ALT: 22 U/L (ref 0–35)
AST: 19 U/L (ref 0–37)
Albumin: 4.4 g/dL (ref 3.5–5.2)
Alkaline Phosphatase: 60 U/L (ref 39–117)
BUN: 21 mg/dL (ref 6–23)
CO2: 26 meq/L (ref 19–32)
Calcium: 9.3 mg/dL (ref 8.4–10.5)
Chloride: 101 meq/L (ref 96–112)
Creatinine, Ser: 1 mg/dL (ref 0.40–1.20)
GFR: 55.88 mL/min — ABNORMAL LOW (ref >60.00)
Glucose, Bld: 225 mg/dL — ABNORMAL HIGH (ref 70–99)
Potassium: 5.7 meq/L — ABNORMAL HIGH (ref 3.5–5.1)
Sodium: 138 meq/L (ref 135–145)
Total Bilirubin: 0.6 mg/dL (ref 0.2–1.2)
Total Protein: 7 g/dL (ref 6.0–8.3)

## 2024-05-31 LAB — LIPID PANEL
Cholesterol: 132 mg/dL (ref 0–200)
HDL: 38.7 mg/dL — ABNORMAL LOW (ref >39.00)
LDL Cholesterol: 38 mg/dL (ref 0–99)
NonHDL: 93.66
Total CHOL/HDL Ratio: 3
Triglycerides: 277 mg/dL — ABNORMAL HIGH (ref 0.0–149.0)
VLDL: 55.4 mg/dL — ABNORMAL HIGH (ref 0.0–40.0)

## 2024-05-31 LAB — TSH: TSH: 0.13 u[IU]/mL — ABNORMAL LOW (ref 0.35–5.50)

## 2024-05-31 LAB — CBC WITH DIFFERENTIAL/PLATELET
Basophils Absolute: 0 K/uL (ref 0.0–0.1)
Basophils Relative: 0.3 % (ref 0.0–3.0)
Eosinophils Absolute: 0 K/uL (ref 0.0–0.7)
Eosinophils Relative: 0.4 % (ref 0.0–5.0)
HCT: 37 % (ref 36.0–46.0)
Hemoglobin: 12.3 g/dL (ref 12.0–15.0)
Lymphocytes Relative: 23.6 % (ref 12.0–46.0)
Lymphs Abs: 1.7 K/uL (ref 0.7–4.0)
MCHC: 33.3 g/dL (ref 30.0–36.0)
MCV: 87.5 fl (ref 78.0–100.0)
Monocytes Absolute: 0.4 K/uL (ref 0.1–1.0)
Monocytes Relative: 5 % (ref 3.0–12.0)
Neutro Abs: 5.1 K/uL (ref 1.4–7.7)
Neutrophils Relative %: 70.7 % (ref 43.0–77.0)
Platelets: 203 K/uL (ref 150.0–400.0)
RBC: 4.23 Mil/uL (ref 3.87–5.11)
RDW: 15.2 % (ref 11.5–15.5)
WBC: 7.2 K/uL (ref 4.0–10.5)

## 2024-05-31 LAB — HEMOGLOBIN A1C
Hgb A1c MFr Bld: 7.2 % — ABNORMAL HIGH (ref <5.7)
Mean Plasma Glucose: 160 mg/dL
eAG (mmol/L): 8.9 mmol/L

## 2024-05-31 LAB — POCT RAPID STREP A (OFFICE): Rapid Strep A Screen: POSITIVE — AB

## 2024-05-31 MED ORDER — AMOXICILLIN 500 MG PO CAPS: 500.0000 mg | ORAL_CAPSULE | Freq: Three times a day (TID) | ORAL | 0 refills | Status: AC

## 2024-05-31 NOTE — Progress Notes (Signed)
 Established Patient Office Visit  Subjective   [name], female    DOB: 1950-09-27  Age: 73 y.o. MRN: [id number]  Chief Complaint  Patient presents with   Follow-up    Patient is here to discuss physical therapy ; Patient is wanting to obtain her drivers license again    Sore Throat    Patient is wanting to have a strep test as well. S/s of itchy throat and post nasal drainage    HPI 73 year old female presents today requesting physical therapy and wanting to get her driver's license again.  Patient had a history of seizures due to a brain abscess.  She had brain surgery on April 1 and has not had a seizure since that time.  She is now ready to drive independently.  However, her license expired may 2025.  She also reports that her daughter wants her to be able to get from the house to the car independently and feels like she needs physical therapy.  Patient reports that she has a caregiver and gets around well.  However, she is willing to undergo physical therapy to ensure that she can get to her car safely.  She is also requesting a strep test to be done today reports her throat is itchy, had a postnasal drip and soreness.  No fever or chills.  Patient saw neurology last month for virtual visit that went well.   Review of Systems  HENT:  Positive for congestion and sore throat.   Neurological:  Negative for seizures.  All other systems reviewed and are negative.  Past Medical History:  Diagnosis Date   Acquired dilation of ascending aorta and aortic root    Ascending arctic 43 mm and aortic root 40 mm by chest CTA 09/2022   Anemia 04/05/2012   Anxiety    Anxiety state 09/11/2007   Qualifier: Diagnosis of  By: Krystal RN, Leeroy     Asthma    Atrial tachycardia, paroxysmal    Back pain 10/02/2014   Breast cancer (HCC) 02/17/2017   CAD (coronary artery disease), native coronary artery    remote Cath with nonobstructive ASCAD with 20% mid LAD, 20% prox left  circ and 20% ostial RCA   Chicken pox as a child   Chronic diastolic CHF (congestive heart failure), NYHA class 1 (HCC)    Complication of anesthesia    pt states feels different in her body after anesthesia when waking up and also experiences a smell of burnt plastic for approx a wk    Constipation    Depression    Diabetes (HCC)    Dilated aortic root    42mm by echo 08/2020   Dysuria 02/17/2017   Elevated LFTs 04/05/2012   GERD (gastroesophageal reflux disease)    Goiter    Heart murmur    hx of one at birth    History of kidney stones    History of radiation therapy 10/31/16-12/17/16   left breast 45 Gy in 25 fractions, left breast boost 16 Gy in 8 fractions   Hx of colonic polyps    Hyperlipidemia    Hypertension    Insomnia 10/08/2016   Joint pain    Malignant neoplasm of upper-outer quadrant of left female breast (HCC) 05/14/2016   not with patient   Measles as a child   Mumps as a child   OA (osteoarthritis) of knee 04/05/2012   Obesity 07/11/2014   Personal history of chemotherapy    Personal history  of radiation therapy    PONV (postoperative nausea and vomiting)    Preventative health care 09/05/2013   PVC's (premature ventricular contractions) 05/02/2016   Shortness of breath dyspnea    walking distances / climbing stairs   Sleep apnea 04/05/2012   Tinea corporis 02/23/2013   Type 2 diabetes mellitus with hyperglycemia (HCC) 12/31/2013   Vasomotor rhinitis 04/05/2012   Ventral hernia    Wheezing     Social History   Socioeconomic History   Marital status: Divorced    Spouse name: Not on file   Number of children: 3   Years of education: Not on file   Highest education level: 12th grade  Occupational History   Occupation: retired - [employer]  Tobacco Use   Smoking status: Never   Smokeless tobacco: Never  Vaping Use   Vaping status: Never Used  Substance and Sexual Activity   Alcohol use: Not Currently    Comment: rarely   Drug use: No    Sexual activity: Never  Other Topics Concern   Not on file  Social History Narrative   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)   Social Drivers of Health   Financial Resource Strain: Low Risk  (01/07/2024)   Overall Financial Resource Strain (CARDIA)    Difficulty of Paying Living Expenses: Not hard at all  Food Insecurity: No Food Insecurity (01/07/2024)   Hunger Vital Sign    Worried About Running Out of Food in the Last Year: Never true    Ran Out of Food in the Last Year: Never true  Transportation Needs: No Transportation Needs (01/07/2024)   PRAPARE - Administrator, Civil Service (Medical): No    Lack of Transportation (Non-Medical): No  Physical Activity: Inactive (01/07/2024)   Exercise Vital Sign    Days of Exercise per Week: 0 days    Minutes of Exercise per Session: Not on file  Stress: Stress Concern Present (01/07/2024)   [employer] of Occupational Health - Occupational Stress Questionnaire    Feeling of Stress: To some extent  Social Connections: Moderately Integrated (01/07/2024)   Social Connection and Isolation Panel    Frequency of Communication with Friends and Family: More than three times a week    Frequency of Social Gatherings with Friends and Family: More than three times a week    Attends Religious Services: More than 4 times per year    Active Member of [employer] or Organizations: Yes    Attends Engineer, Structural: More than 4 times per year    Marital Status: Divorced  Intimate Partner Violence: Not At Risk (08/19/2023)   Humiliation, Afraid, Rape, and Kick questionnaire    Fear of Current or Ex-Partner: No    Emotionally Abused: No    Physically Abused: No    Sexually Abused: No    Past Surgical History:  Procedure Laterality Date   BREAST LUMPECTOMY Left 2017   BREAST LUMPECTOMY WITH RADIOACTIVE SEED AND SENTINEL LYMPH NODE BIOPSY Left 06/21/2016   Procedure: LEFT BREAST LUMPECTOMY WITH RADIOACTIVE SEED AND  SENTINEL LYMPH NODE BIOPSY, INJECT BLUE DYE LEFT BREAST;  Surgeon: Elon Pacini, MD;  Location: [facility] OR;  Service: General;  Laterality: Left;   CARDIAC CATHETERIZATION     normal coroary arteries per patient   CARDIOVASCULAR STRESS TEST     10/12/2013   CESAREAN SECTION     X 3   CHOLECYSTECTOMY     COLONOSCOPY WITH PROPOFOL  N/A 03/28/2015  Procedure: COLONOSCOPY WITH PROPOFOL ;  Surgeon: Renaye Sous, MD;  Location: [facility] ENDOSCOPY;  Service: Endoscopy;  Laterality: N/A;   HERNIA REPAIR  02/08/2011   ventral hernia   JOINT REPLACEMENT     bilateral   KNEE ARTHROSCOPY  05/2010   bilateral   LEFT AND RIGHT HEART CATHETERIZATION WITH CORONARY ANGIOGRAM N/A 11/08/2013   Procedure: LEFT AND RIGHT HEART CATHETERIZATION WITH CORONARY ANGIOGRAM;  Surgeon: Lonni JONETTA Cash, MD;  Location: [facility] CATH LAB;  Service: Cardiovascular;  Laterality: N/A;   MENISCUS REPAIR  2009   MOUTH SURGERY     teeth implants   PORT-A-CATH REMOVAL N/A 01/24/2017   Procedure: REMOVAL PORT-A-CATH;  Surgeon: Gail Favorite, MD;  Location: [facility] OR;  Service: General;  Laterality: N/A;   PORTACATH PLACEMENT Right 07/16/2016   Procedure: INSERTION PORT-A-CATH RIGHT INTERNAL JUGULAR WITH ULTRASOUND;  Surgeon: Favorite Gail, MD;  Location: [facility] OR;  Service: General;  Laterality: Right;   REPAIR DURAL / CSF LEAK  2021   performed at [facility] in [place]   TOTAL KNEE ARTHROPLASTY  2011   left   TOTAL KNEE ARTHROPLASTY Right 05/10/2014   Procedure: RIGHT TOTAL KNEE ARTHROPLASTY;  Surgeon: Donnice JONETTA Car, MD;  Location: [facility] ORS;  Service: Orthopedics;  Laterality: Right;   TOTAL THYROIDECTOMY Bilateral 2022   TUBAL LIGATION     WISDOM TOOTH EXTRACTION  2000    Family History  Problem Relation Age of Onset   Thyroid  disease Mother        hypothyroid   Hyperlipidemia Mother    Depression Mother    Anxiety disorder Mother    Heart attack Father 22   Hypertension Father    Arthritis Father        RA    Coronary artery disease Father    High Cholesterol Father    Colon cancer Maternal Aunt    Colon cancer Maternal Grandmother    Heart attack Maternal Grandfather    Alcohol abuse Paternal Grandfather    Coronary artery disease Brother    Heart disease Brother    Breast cancer Neg Hx    BRCA 1/2 Neg Hx     Allergies  Allergen Reactions   Oxycodone  Other (See Comments)    Does not feel good. Makes her sleepy.    Current Outpatient Medications on File Prior to Visit  Medication Sig Dispense Refill   Accu-Chek Softclix Lancets lancets Use to check blood glucose up to twice a day (Dx: Type 2 DM with long term insulin  use E11.57, Z79.4) 100 each 5   acetaminophen  (TYLENOL ) 500 MG tablet Take 1,000 mg by mouth 2 (two) times daily as needed for moderate pain or headache.     albuterol  (VENTOLIN  HFA) 108 (90 Base) MCG/ACT inhaler Inhale 2 puffs every 4-6 hours as needed - rescue. 18 g 12   blood glucose meter kit and supplies Dispense based on patient and insurance preference. Use up to four times daily as directed. (FOR ICD-10 E10.9, E11.9). 1 each 0   budesonide -formoterol  (SYMBICORT ) 160-4.5 MCG/ACT inhaler Inhale 2 puffs into the lungs 2 (two) times daily. As needed 1 each 5   busPIRone  (BUSPAR ) 7.5 MG tablet Take 1 tablet (7.5 mg total) by mouth 3 (three) times daily. 270 tablet 1   carvedilol  (COREG ) 25 MG tablet TAKE ONE TABLET TWICE DAILY WITH MEAL(S) (Patient taking differently: Take 12.5 mg by mouth 2 (two) times daily with a meal.) 180 tablet 1   cetirizine  (ZYRTEC ) 10 MG tablet Take  1 tablet (10 mg total) by mouth daily. 100 tablet 0   cholecalciferol (VITAMIN D3) 25 MCG (1000 UNIT) tablet Take 2 tablets (2,000 Units total) by mouth daily. 100 tablet 0   Continuous Glucose Sensor (FREESTYLE LIBRE 3 PLUS SENSOR) MISC CHANGE EVERY 15 DAYS TO CONTIUOUSLY CHECK BLOOD GLUCOSE 2 each 2   diltiazem  (CARDIZEM  CD) 180 MG 24 hr capsule TAKE ONE CAPSULE BY MOUTH DAILY 90 capsule 1    escitalopram  (LEXAPRO ) 20 MG tablet Take 1 tablet (20 mg total) by mouth in the morning AND 0.5 tablets (10 mg total) every evening. 135 tablet 1   esomeprazole  (NEXIUM ) 40 MG capsule TAKE ONE CAPSULE ONCE DAILY 90 capsule 1   ezetimibe  (ZETIA ) 10 MG tablet TAKE ONE TABLET ONCE DAILY 90 tablet 1   fenofibrate  (TRICOR ) 145 MG tablet TAKE ONE TABLET ONCE DAILY 90 tablet 1   ferrous sulfate  325 (65 FE) MG EC tablet Take 325 mg by mouth every other day.     fluticasone  (FLONASE ) 50 MCG/ACT nasal spray Place 2 sprays into both nostrils daily. 16 g 6   furosemide  (LASIX ) 20 MG tablet Take 1 tablet (20 mg total) by mouth daily. 90 tablet 1   glucose blood (ACCU-CHEK AVIVA) test strip Twice daily (please use brand that patient insurance covers) 100 each 5   hydrALAZINE  (APRESOLINE ) 10 MG tablet Take 1 tablet (10 mg total) by mouth 3 (three) times daily. 270 tablet 0   insulin  glargine (LANTUS ) 100 UNIT/ML Solostar Pen Inject 10 Units into the skin daily.     Insulin  Pen Needle (GNP ULTICARE PEN NEEDLES) 32G X 4 MM MISC Use w/ Lantus  100 each 3   isosorbide  dinitrate (ISORDIL ) 10 MG tablet Take 1 tablet (10 mg total) by mouth 3 (three) times daily. 90 tablet 5   levETIRAcetam  (KEPPRA ) 1000 MG tablet Take 1 tablet (1,000 mg total) by mouth 2 (two) times daily. 180 tablet 1   levothyroxine  (SYNTHROID ) 200 MCG tablet Take 1 tablet (200 mcg total) by mouth daily before breakfast. 90 tablet 0   losartan  (COZAAR ) 100 MG tablet Take 1 tablet (100 mg total) by mouth daily. 90 tablet 1   meclizine  (ANTIVERT ) 25 MG tablet Take 1 tablet (25 mg total) by mouth 3 (three) times daily as needed for dizziness. 30 tablet 1   metFORMIN  (GLUCOPHAGE -XR) 500 MG 24 hr tablet TAKE 4 TABLETS DAILY WITH SUPPER 360 tablet 1   montelukast  (SINGULAIR ) 10 MG tablet TAKE ONE TABLET AT BEDTIME 90 tablet 1   ondansetron  (ZOFRAN -ODT) 4 MG disintegrating tablet Take 1 tablet (4 mg total) by mouth 2 (two) times daily as needed for nausea or  vomiting.     potassium chloride  SA (KLOR-CON  M) 20 MEQ tablet Take 1 tablet (20 mEq total) by mouth 2 (two) times daily. 180 tablet 1   rosuvastatin  (CRESTOR ) 40 MG tablet TAKE ONE TABLET DAILY 90 tablet 1   Semaglutide , 1 MG/DOSE, (OZEMPIC , 1 MG/DOSE,) 4 MG/3ML SOPN Inject 1 mg into the skin once a week. 9 mL 3   spironolactone  (ALDACTONE ) 25 MG tablet TAKE ONE TABLET BY MOUTH DAILY 90 tablet 0   Current Facility-Administered Medications on File Prior to Visit  Medication Dose Route Frequency Provider Last Rate Last Admin   [START ON 10/05/2024] denosumab  (PROLIA ) injection 60 mg  60 mg Subcutaneous Q6 months Domenica Blackbird A, MD        BP 112/72 (BP Location: Right Arm, Patient Position: Sitting, Cuff Size: Large)   Pulse ROLLEN)  57   Temp (!) 97.2 F (36.2 C) (Oral)   Resp 16   Ht 5' 6 (1.676 m)   Wt 273 lb (123.8 kg)   SpO2 96%   BMI 44.06 kg/m chart    Objective:     BP 112/72 (BP Location: Right Arm, Patient Position: Sitting, Cuff Size: Large)   Pulse (!) 57   Temp (!) 97.2 F (36.2 C) (Oral)   Resp 16   Ht 5' 6 (1.676 m)   Wt 273 lb (123.8 kg)   SpO2 96%   BMI 44.06 kg/m    Physical Exam Vitals and nursing note reviewed.  Constitutional:      Appearance: She is well-developed. She is obese.  HENT:     Right Ear: Tympanic membrane and ear canal normal.     Left Ear: Tympanic membrane and ear canal normal.     Nose: No congestion.     Mouth/Throat:     Mouth: Mucous membranes are moist.     Pharynx: Posterior oropharyngeal erythema present. No pharyngeal swelling or oropharyngeal exudate.     Tonsils: 2+ on the right. 2+ on the left.  Cardiovascular:     Rate and Rhythm: Normal rate and regular rhythm.     Heart sounds: Normal heart sounds.  Pulmonary:     Effort: Pulmonary effort is normal.     Breath sounds: Normal breath sounds.  Abdominal:     Palpations: Abdomen is soft.  Musculoskeletal:     Cervical back: Normal range of motion and neck supple.   Skin:    General: Skin is warm and dry.  Neurological:     General: No focal deficit present.     Mental Status: She is alert and oriented to person, place, and time.  Psychiatric:        Mood and Affect: Mood normal.        Behavior: Behavior normal.      Results for orders placed or performed in visit on 05/31/24  POCT rapid strep A  Result Value Ref Range   Rapid Strep A Screen Positive (A) Negative      The ASCVD Risk score (Arnett DK, et al., 2019) failed to calculate for the following reasons:   Risk score cannot be calculated because patient has a medical history suggesting prior/existing ASCVD    Assessment & Plan:   Problem List Items Addressed This Visit     Uncontrolled type 2 diabetes mellitus with hyperglycemia (HCC) - Primary   Relevant Orders   Ambulatory referral to Home Health   Comp Met (CMET)   Lipid panel   Hemoglobin A1c   TSH   Essential hypertension   Relevant Orders   Comp Met (CMET)   CBC w/Diff   Chronic diastolic CHF (congestive heart failure) (HCC)   Relevant Orders   TSH   Other Visit Diagnoses       Sore throat       Relevant Orders   POCT rapid strep A (Completed)     Brain abscess       Relevant Orders   Ambulatory referral to Home Health   TSH     Strep pharyngitis       Relevant Orders   CBC w/Diff     Decreased mobility and endurance       Relevant Orders   Ambulatory referral to Home Health   CBC w/Diff   TSH       No follow-ups on file.  Order  for physical therapy placed for evaluation to see if she can safely get from her house to her car.  Labs obtained will notify patient pending results.  Will treat with amoxicillin  500 mg 3 times a day for strep throat.  Rest.  Drink plenty of fluids.  Call the office if symptoms worsen or persist.  Recheck in 4 to 6 months and sooner as needed.  Patient is a where the clearance to drive would have to come from her neurologist.  Kenney KATHEE Roys, FNP

## 2024-06-01 ENCOUNTER — Other Ambulatory Visit: Payer: Self-pay

## 2024-06-01 ENCOUNTER — Encounter: Payer: Self-pay | Admitting: Family Medicine

## 2024-06-01 ENCOUNTER — Ambulatory Visit: Payer: Self-pay | Admitting: Family

## 2024-06-01 ENCOUNTER — Telehealth: Payer: Self-pay

## 2024-06-01 DIAGNOSIS — E875 Hyperkalemia: Secondary | ICD-10-CM

## 2024-06-01 MED ORDER — SPIRONOLACTONE 25 MG PO TABS: 25.0000 mg | ORAL_TABLET | Freq: Every day | ORAL | 0 refills | Status: DC

## 2024-06-01 NOTE — Telephone Encounter (Signed)
 Rx sent.

## 2024-06-01 NOTE — Telephone Encounter (Signed)
 Copied from CRM #[id number]. Topic: Clinical - Prescription Issue >> Jun 01, 2024 10:09 AM Shanda MATSU wrote: Reason for CRM: Stew w/[facility] is calling in regards to denial for prescription refill for med, spironolactone  (ALDACTONE ) 25 MG tablet, is req a call back in regards to this.

## 2024-06-09 ENCOUNTER — Telehealth: Payer: Self-pay

## 2024-06-09 NOTE — Telephone Encounter (Unsigned)
 Copied from CRM #[id number]. Topic: Clinical - Home Health Verbal Orders >> Jun 08, 2024 12:27 PM Roselie BROCKS wrote: [facility]  Callback Number: [phone] [place] Service Requested: Physical Therapy Frequency:delayed care , has appnt upcoming  on Dec 19th 2025 Any new concerns about the patient? No

## 2024-06-09 NOTE — Telephone Encounter (Unsigned)
 Copied from CRM #[id number]. Topic: Clinical - Medical Advice >> Jun 09, 2024 11:23 AM [name] wrote: Reason for CRM: Patient called in stated she will be done with her antibiotics today, patient stated she still isnt feeling good, wants to know if another round needs to be called in or if she needs to come back in would like a callback

## 2024-06-10 ENCOUNTER — Other Ambulatory Visit: Payer: Self-pay | Admitting: Family

## 2024-06-10 MED ORDER — METHYLPREDNISOLONE 4 MG PO TBPK: ORAL_TABLET | ORAL | 0 refills | Status: AC

## 2024-06-12 ENCOUNTER — Other Ambulatory Visit: Payer: Self-pay | Admitting: Family Medicine

## 2024-06-12 DIAGNOSIS — E119 Type 2 diabetes mellitus without complications: Secondary | ICD-10-CM

## 2024-06-16 ENCOUNTER — Other Ambulatory Visit (HOSPITAL_BASED_OUTPATIENT_CLINIC_OR_DEPARTMENT_OTHER): Payer: Self-pay

## 2024-06-16 ENCOUNTER — Encounter: Payer: Self-pay | Admitting: Family

## 2024-06-16 ENCOUNTER — Ambulatory Visit: Admitting: Family

## 2024-06-16 VITALS — BP 136/78 | HR 60 | Temp 98.5°F | Resp 18 | Ht 66.0 in | Wt 272.4 lb

## 2024-06-16 DIAGNOSIS — J020 Streptococcal pharyngitis: Secondary | ICD-10-CM

## 2024-06-16 DIAGNOSIS — J019 Acute sinusitis, unspecified: Secondary | ICD-10-CM

## 2024-06-16 LAB — POCT RAPID STREP A (OFFICE): Rapid Strep A Screen: POSITIVE — AB

## 2024-06-16 MED ORDER — AMOXICILLIN-POT CLAVULANATE 875-125 MG PO TABS
1.0000 | ORAL_TABLET | Freq: Two times a day (BID) | ORAL | 0 refills | Status: AC
  Filled 2024-06-16: qty 20, 10d supply, fill #0

## 2024-06-16 NOTE — Patient Instructions (Signed)
" °  VISIT SUMMARY: Today, you were seen for a persistent sore throat and facial pain following treatment for strep throat. You were previously treated with amoxicillin  and prednisone , but your symptoms have not resolved. You do not have a fever or body aches, and you have received your flu shot this year.  YOUR PLAN: -ACUTE STREPTOCOCCAL PHARYNGITIS: You have a recurrent sore throat caused by a bacterial infection known as strep throat. Since the previous treatments with amoxicillin  and prednisone  were not effective, you have been prescribed Augmentin . Please replace your toothbrush after 24 hours of starting the new medication. If your symptoms do not improve, contact us  for a potential referral to an ear, nose, and throat specialist.  -ACUTE SINUSITIS: Your facial soreness and drainage suggest a sinus infection, which is an inflammation of the sinuses. Since the previous treatment with amoxicillin  was not effective, you have been prescribed Augmentin . Monitor your symptoms and contact us  if there is no improvement.  INSTRUCTIONS: Please start taking Augmentin  as prescribed and replace your toothbrush after 24 hours of starting the medication. If your symptoms do not improve, contact us  for a potential referral to an ear, nose, and throat specialist.      "

## 2024-06-16 NOTE — Progress Notes (Signed)
 "  Subjective:     [name], female    DOB: 06-14-1951, 73 y.o.   MRN: [id number]  Chief Complaint  Patient presents with   Sinus Problem    Sxs started x2 weeks, pt was seen by [facility]. Pt had strep. Pt was given antibiotic and prednisone . Sxs didn't know improve after anti    Sinus Problem    Discussed the use of AI scribe software for clinical note transcription with the patient, who gave verbal consent to proceed.  History of Present Illness [name] is a 73 year old female who presents with persistent sore throat and facial pain following treatment for strep throat.  Two weeks ago, she was diagnosed with strep throat and started on a 10-day course of amoxicillin .  She describes persistent symptoms of a sensation of a 'big clunk of stuff' in the back of her throat, drainage, and significant facial soreness, which alternates between both cheeks. No nasal discharge or fever, but ongoing throat pain is present.  She was subsequently prescribed prednisone , which she completed, but her symptoms have not resolved.  She has a history of brain surgery and is not frequently around children or other people, except when visiting the clinic.  She has received a flu shot this year and denies symptoms such as body aches or fever that are typically associated with the flu.      Health Maintenance Due  Topic Date Due   FOOT EXAM  02/15/2023   OPHTHALMOLOGY EXAM  10/28/2023   COVID-19 Vaccine (5 - 2025-26 season) 02/23/2024    Past Medical History:  Diagnosis Date   Acquired dilation of ascending aorta and aortic root    Ascending arctic 43 mm and aortic root 40 mm by chest CTA 09/2022   Anemia 04/05/2012   Anxiety    Anxiety state 09/11/2007   Qualifier: Diagnosis of  By: Krystal RN, Leeroy     Asthma    Atrial tachycardia, paroxysmal    Back pain 10/02/2014   Breast cancer (HCC) 02/17/2017   CAD (coronary artery disease), native coronary artery     remote Cath with nonobstructive ASCAD with 20% mid LAD, 20% prox left circ and 20% ostial RCA   Chicken pox as a child   Chronic diastolic CHF (congestive heart failure), NYHA class 1 (HCC)    Complication of anesthesia    pt states feels different in her body after anesthesia when waking up and also experiences a smell of burnt plastic for approx a wk    Constipation    Depression    Diabetes (HCC)    Dilated aortic root    42mm by echo 08/2020   Dysuria 02/17/2017   Elevated LFTs 04/05/2012   GERD (gastroesophageal reflux disease)    Goiter    Heart murmur    hx of one at birth    History of kidney stones    History of radiation therapy 10/31/16-12/17/16   left breast 45 Gy in 25 fractions, left breast boost 16 Gy in 8 fractions   Hx of colonic polyps    Hyperlipidemia    Hypertension    Insomnia 10/08/2016   Joint pain    Malignant neoplasm of upper-outer quadrant of left female breast (HCC) 05/14/2016   not with patient   Measles as a child   Mumps as a child   OA (osteoarthritis) of knee 04/05/2012   Obesity 07/11/2014   Personal history of chemotherapy    Personal  history of radiation therapy    PONV (postoperative nausea and vomiting)    Preventative health care 09/05/2013   PVC's (premature ventricular contractions) 05/02/2016   Shortness of breath dyspnea    walking distances / climbing stairs   Sleep apnea 04/05/2012   Tinea corporis 02/23/2013   Type 2 diabetes mellitus with hyperglycemia (HCC) 12/31/2013   Vasomotor rhinitis 04/05/2012   Ventral hernia    Wheezing     Past Surgical History:  Procedure Laterality Date   BREAST LUMPECTOMY Left 2017   BREAST LUMPECTOMY WITH RADIOACTIVE SEED AND SENTINEL LYMPH NODE BIOPSY Left 06/21/2016   Procedure: LEFT BREAST LUMPECTOMY WITH RADIOACTIVE SEED AND SENTINEL LYMPH NODE BIOPSY, INJECT BLUE DYE LEFT BREAST;  Surgeon: Elon Pacini, MD;  Location: [facility] OR;  Service: General;  Laterality: Left;   CARDIAC  CATHETERIZATION     normal coroary arteries per patient   CARDIOVASCULAR STRESS TEST     10/12/2013   CESAREAN SECTION     X 3   CHOLECYSTECTOMY     COLONOSCOPY WITH PROPOFOL  N/A 03/28/2015   Procedure: COLONOSCOPY WITH PROPOFOL ;  Surgeon: Renaye Sous, MD;  Location: [facility] END[facility]COPY;  Service: Endoscopy;  Laterality: N/A;   HERNIA REPAIR  02/08/2011   ventral hernia   JOINT REPLACEMENT     bilateral   KNEE ARTHROSCOPY  05/2010   bilateral   LEFT AND RIGHT HEART CATHETERIZATION WITH CORONARY ANGIOGRAM N/A 11/08/2013   Procedure: LEFT AND RIGHT HEART CATHETERIZATION WITH CORONARY ANGIOGRAM;  Surgeon: Lonni JONETTA Cash, MD;  Location: [facility] CATH LAB;  Service: Cardiovascular;  Laterality: N/A;   MENISCUS REPAIR  2009   MOUTH SURGERY     teeth implants   PORT-A-CATH REMOVAL N/A 01/24/2017   Procedure: REMOVAL PORT-A-CATH;  Surgeon: Pacini Elon, MD;  Location: [facility] OR;  Service: General;  Laterality: N/A;   PORTACATH PLACEMENT Right 07/16/2016   Procedure: INSERTION PORT-A-CATH RIGHT INTERNAL JUGULAR WITH ULTRASOUND;  Surgeon: Elon Pacini, MD;  Location: [facility] OR;  Service: General;  Laterality: Right;   REPAIR DURAL / CSF LEAK  2021   performed at [facility] in [place]   TOTAL KNEE ARTHROPLASTY  2011   left   TOTAL KNEE ARTHROPLASTY Right 05/10/2014   Procedure: RIGHT TOTAL KNEE ARTHROPLASTY;  Surgeon: Donnice JONETTA Car, MD;  Location: [facility] ORS;  Service: Orthopedics;  Laterality: Right;   TOTAL THYROIDECTOMY Bilateral 2022   TUBAL LIGATION     WISDOM TOOTH EXTRACTION  2000    Family History  Problem Relation Age of Onset   Thyroid  disease Mother        hypothyroid   Hyperlipidemia Mother    Depression Mother    Anxiety disorder Mother    Heart attack Father 58   Hypertension Father    Arthritis Father        RA   Coronary artery disease Father    High Cholesterol Father    Colon cancer Maternal Aunt    Colon cancer Maternal Grandmother    Heart attack Maternal  Grandfather    Alcohol abuse Paternal Grandfather    Coronary artery disease Brother    Heart disease Brother    Breast cancer Neg Hx    BRCA 1/2 Neg Hx     Social History   Socioeconomic History   Marital status: Divorced    Spouse name: Not on file   Number of children: 3   Years of education: Not on file   Highest education level: 12th grade  Occupational  History   Occupation: retired - [employer]  Tobacco Use   Smoking status: Never   Smokeless tobacco: Never  Vaping Use   Vaping status: Never Used  Substance and Sexual Activity   Alcohol use: Not Currently    Comment: rarely   Drug use: No   Sexual activity: Never  Other Topics Concern   Not on file  Social History Narrative   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)   Social Drivers of Health   Tobacco Use: Low Risk (06/16/2024)   Patient History    Smoking Tobacco Use: Never    Smokeless Tobacco Use: Never    Passive Exposure: Not on file  Financial Resource Strain: Low Risk (01/07/2024)   Overall Financial Resource Strain (CARDIA)    Difficulty of Paying Living Expenses: Not hard at all  Food Insecurity: No Food Insecurity (01/07/2024)   Epic    Worried About Programme Researcher, Broadcasting/film/video in the Last Year: Never true    Ran Out of Food in the Last Year: Never true  Transportation Needs: No Transportation Needs (01/07/2024)   Epic    Lack of Transportation (Medical): No    Lack of Transportation (Non-Medical): No  Physical Activity: Inactive (01/07/2024)   Exercise Vital Sign    Days of Exercise per Week: 0 days    Minutes of Exercise per Session: Not on file  Stress: Stress Concern Present (01/07/2024)   [employer] of Occupational Health - Occupational Stress Questionnaire    Feeling of Stress: To some extent  Social Connections: Moderately Integrated (01/07/2024)   Social Connection and Isolation Panel    Frequency of Communication with Friends and Family: More than three times a week     Frequency of Social Gatherings with Friends and Family: More than three times a week    Attends Religious Services: More than 4 times per year    Active Member of [employer] or Organizations: Yes    Attends Banker Meetings: More than 4 times per year    Marital Status: Divorced  Intimate Partner Violence: Not At Risk (08/19/2023)   Humiliation, Afraid, Rape, and Kick questionnaire    Fear of Current or Ex-Partner: No    Emotionally Abused: No    Physically Abused: No    Sexually Abused: No  Depression (PHQ2-9): Low Risk (05/31/2024)   Depression (PHQ2-9)    PHQ-2 Score: 0  Alcohol Screen: Low Risk (01/07/2024)   Alcohol Screen    Last Alcohol Screening Score (AUDIT): 0  Housing: Low Risk (01/07/2024)   Epic    Unable to Pay for Housing in the Last Year: No    Number of Times Moved in the Last Year: 0    Homeless in the Last Year: No  Utilities: Not At Risk (08/19/2023)   AHC Utilities    Threatened with loss of utilities: No  Health Literacy: Low Risk (10/17/2023)   Received from [facility] Literacy    How often do you need to have someone help you when you read instructions, pamphlets, or other written material from your doctor or pharmacy?: Never    Outpatient Medications Prior to Visit  Medication Sig Dispense Refill   Accu-Chek Softclix Lancets lancets CHECK BLOOD GLUCOSE UP TO 2 TIMES A DAY 100 each 12   acetaminophen  (TYLENOL ) 500 MG tablet Take 1,000 mg by mouth 2 (two) times daily as needed for moderate pain or headache.     albuterol  (VENTOLIN  HFA)  108 (90 Base) MCG/ACT inhaler Inhale 2 puffs every 4-6 hours as needed - rescue. 18 g 12   blood glucose meter kit and supplies Dispense based on patient and insurance preference. Use up to four times daily as directed. (FOR ICD-10 E10.9, E11.9). 1 each 0   budesonide -formoterol  (SYMBICORT ) 160-4.5 MCG/ACT inhaler Inhale 2 puffs into the lungs 2 (two) times daily. As needed 1 each 5   busPIRone  (BUSPAR ) 7.5  MG tablet Take 1 tablet (7.5 mg total) by mouth 3 (three) times daily. 270 tablet 1   carvedilol  (COREG ) 25 MG tablet TAKE ONE TABLET TWICE DAILY WITH MEAL(S) (Patient taking differently: Take 12.5 mg by mouth 2 (two) times daily with a meal.) 180 tablet 1   cetirizine  (ZYRTEC ) 10 MG tablet Take 1 tablet (10 mg total) by mouth daily. 100 tablet 0   cholecalciferol (VITAMIN D3) 25 MCG (1000 UNIT) tablet Take 2 tablets (2,000 Units total) by mouth daily. 100 tablet 0   Continuous Glucose Sensor (FREESTYLE LIBRE 3 PLUS SENSOR) MISC CHANGE EVERY 15 DAYS TO CONTIUOUSLY CHECK BLOOD GLUCOSE 2 each 2   diltiazem  (CARDIZEM  CD) 180 MG 24 hr capsule TAKE ONE CAPSULE BY MOUTH DAILY 90 capsule 1   escitalopram  (LEXAPRO ) 20 MG tablet Take 1 tablet (20 mg total) by mouth in the morning AND 0.5 tablets (10 mg total) every evening. 135 tablet 1   esomeprazole  (NEXIUM ) 40 MG capsule TAKE ONE CAPSULE ONCE DAILY 90 capsule 1   ezetimibe  (ZETIA ) 10 MG tablet TAKE ONE TABLET ONCE DAILY 90 tablet 1   fenofibrate  (TRICOR ) 145 MG tablet TAKE ONE TABLET ONCE DAILY 90 tablet 1   ferrous sulfate  325 (65 FE) MG EC tablet Take 325 mg by mouth every other day.     fluticasone  (FLONASE ) 50 MCG/ACT nasal spray Place 2 sprays into both nostrils daily. 16 g 6   furosemide  (LASIX ) 20 MG tablet Take 1 tablet (20 mg total) by mouth daily. 90 tablet 1   glucose blood (ACCU-CHEK GUIDE TEST) test strip CHECK BLOOD SUGAR TWICE DAILY AS DIRECTED 100 strip 12   hydrALAZINE  (APRESOLINE ) 10 MG tablet Take 1 tablet (10 mg total) by mouth 3 (three) times daily. 270 tablet 0   insulin  glargine (LANTUS ) 100 UNIT/ML Solostar Pen Inject 10 Units into the skin daily.     Insulin  Pen Needle (GNP ULTICARE PEN NEEDLES) 32G X 4 MM MISC Use w/ Lantus  100 each 3   isosorbide  dinitrate (ISORDIL ) 10 MG tablet Take 1 tablet (10 mg total) by mouth 3 (three) times daily. 90 tablet 5   levETIRAcetam  (KEPPRA ) 1000 MG tablet Take 1 tablet (1,000 mg total) by mouth  2 (two) times daily. 180 tablet 1   levothyroxine  (SYNTHROID ) 200 MCG tablet Take 1 tablet by mouth 6 days weekly (skip 1 day)     losartan  (COZAAR ) 100 MG tablet Take 1 tablet (100 mg total) by mouth daily. 90 tablet 1   meclizine  (ANTIVERT ) 25 MG tablet Take 1 tablet (25 mg total) by mouth 3 (three) times daily as needed for dizziness. 30 tablet 1   metFORMIN  (GLUCOPHAGE -XR) 500 MG 24 hr tablet TAKE 4 TABLETS DAILY WITH SUPPER 360 tablet 1   methylPREDNISolone  (MEDROL  DOSEPAK) 4 MG TBPK tablet As directed 21 tablet 0   montelukast  (SINGULAIR ) 10 MG tablet TAKE ONE TABLET AT BEDTIME 90 tablet 1   ondansetron  (ZOFRAN -ODT) 4 MG disintegrating tablet Take 1 tablet (4 mg total) by mouth 2 (two) times daily as needed for nausea  or vomiting.     potassium chloride  SA (KLOR-CON  M) 20 MEQ tablet Take 1 tablet (20 mEq total) by mouth 2 (two) times daily. 180 tablet 1   rosuvastatin  (CRESTOR ) 40 MG tablet TAKE ONE TABLET DAILY 90 tablet 1   Semaglutide , 1 MG/DOSE, (OZEMPIC , 1 MG/DOSE,) 4 MG/3ML SOPN Inject 1 mg into the skin once a week. 9 mL 3   spironolactone  (ALDACTONE ) 25 MG tablet Take 1 tablet (25 mg total) by mouth daily. 90 tablet 0   Facility-Administered Medications Prior to Visit  Medication Dose Route Frequency Provider Last Rate Last Admin   [START ON 10/05/2024] denosumab  (PROLIA ) injection 60 mg  60 mg Subcutaneous Q6 months Domenica Harlene LABOR, MD        Allergies[1]  ROS     Objective:    Physical Exam Constitutional:      General: She is not in acute distress.    Appearance: Normal appearance. She is well-developed.  HENT:     Head: Normocephalic and atraumatic.     Right Ear: External ear normal. There is impacted cerumen.     Left Ear: External ear normal. Tympanic membrane is not erythematous or retracted.     Nose:     Right Sinus: Maxillary sinus tenderness and frontal sinus tenderness present.     Left Sinus: Maxillary sinus tenderness and frontal sinus tenderness  present.     Mouth/Throat:     Mouth: Mucous membranes are moist.     Pharynx: Uvula midline. Posterior oropharyngeal erythema and uvula swelling (slight) present.     Tonsils: No tonsillar exudate. 2+ on the left.  Eyes:     General: No scleral icterus. Neck:     Thyroid : No thyromegaly.  Cardiovascular:     Rate and Rhythm: Normal rate and regular rhythm.     Heart sounds: Normal heart sounds. No murmur heard. Pulmonary:     Effort: Pulmonary effort is normal. No respiratory distress.     Breath sounds: Normal breath sounds. No wheezing.  Musculoskeletal:     Cervical back: Neck supple.  Skin:    General: Skin is warm and dry.  Neurological:     Mental Status: She is alert and oriented to person, place, and time.  Psychiatric:        Mood and Affect: Mood normal.        Behavior: Behavior normal.        Thought Content: Thought content normal.        Judgment: Judgment normal.      BP 136/78 (BP Location: Right Arm, Patient Position: Sitting, Cuff Size: Large)   Pulse 60   Temp 98.5 F (36.9 C) (Oral)   Resp 18   Ht 5' 6 (1.676 m)   Wt 272 lb 6.4 oz (123.6 kg)   SpO2 97%   BMI 43.97 kg/m  Wt Readings from Last 3 Encounters:  06/16/24 272 lb 6.4 oz (123.6 kg)  05/31/24 273 lb (123.8 kg)  09/11/23 294 lb 1.5 oz (133.4 kg)       Assessment & Plan:   Problem List Items Addressed This Visit   None Visit Diagnoses       Acute sinusitis, recurrence not specified, unspecified location    -  Primary   Relevant Medications   amoxicillin -clavulanate (AUGMENTIN ) 875-125 MG tablet     Strep pharyngitis       Relevant Medications   amoxicillin -clavulanate (AUGMENTIN ) 875-125 MG tablet   Other Relevant Orders   POCT rapid  strep A (Completed)     Acute streptococcal pharyngitis Recurrent sore throat with positive strep test. Previous amoxicillin  and prednisone  ineffective. No fever or body aches. - Prescribed Augmentin . - Advised toothbrush replacement after 24  hours of treatment. - Instructed to contact if no improvement for potential ENT referral.  Acute sinusitis Facial soreness and drainage suggest sinusitis. Previous amoxicillin  ineffective. - Prescribed Augmentin . - Advised to monitor symptoms and contact if no improvement.   Assessment & Plan     I am having Nikhita A. Elodie start on amoxicillin -clavulanate. I am also having her maintain her acetaminophen , fluticasone , albuterol , blood glucose meter kit and supplies, Ozempic  (1 MG/DOSE), ferrous sulfate , insulin  glargine, ondansetron , meclizine , diltiazem , metFORMIN , ezetimibe , rosuvastatin , fenofibrate , montelukast , esomeprazole , carvedilol , isosorbide  dinitrate, budesonide -formoterol , cetirizine , cholecalciferol, levETIRAcetam , hydrALAZINE , GNP UltiCare Pen Needles, escitalopram , busPIRone , furosemide , losartan , potassium chloride  SA, FreeStyle Libre 3 Plus Sensor, spironolactone , levothyroxine , methylPREDNISolone , Accu-Chek Guide Test, and Accu-Chek Softclix Lancets. We will continue to administer denosumab .  Meds ordered this encounter  Medications   amoxicillin -clavulanate (AUGMENTIN ) 875-125 MG tablet    Sig: Take 1 tablet by mouth 2 (two) times daily for 10 days.    Dispense:  20 tablet    Refill:  0    Supervising Provider:   DOMENICA BLACKBIRD A [[id number]]      [1]  Allergies Allergen Reactions   Oxycodone  Other (See Comments)    Does not feel good. Makes her sleepy.   "

## 2024-06-24 ENCOUNTER — Encounter

## 2024-06-25 ENCOUNTER — Telehealth: Admitting: Physician Assistant

## 2024-06-25 ENCOUNTER — Telehealth: Payer: Self-pay | Admitting: *Deleted

## 2024-06-25 DIAGNOSIS — U071 COVID-19: Secondary | ICD-10-CM

## 2024-06-25 MED ORDER — BENZONATATE 100 MG PO CAPS: 100.0000 mg | ORAL_CAPSULE | Freq: Three times a day (TID) | ORAL | 0 refills | Status: AC | PRN

## 2024-06-25 MED ORDER — NIRMATRELVIR/RITONAVIR (PAXLOVID) TABLET (RENAL DOSING): 2.0000 | ORAL_TABLET | Freq: Two times a day (BID) | ORAL | 0 refills | Status: AC

## 2024-06-25 NOTE — Telephone Encounter (Signed)
Pt seen virtually today

## 2024-06-25 NOTE — Patient Instructions (Addendum)
 " [name], thank you for joining [name] CHRISTELLA Dickinson, PA-C for today's virtual visit.  While this provider is not your primary care provider (PCP), if your PCP is located in our provider database this encounter information will be shared with them immediately following your visit.   A [facility] MyChart account gives you access to today's visit and all your visits, tests, and labs performed at [facility]  click here if you don't have a [facility] MyChart account or go to [email or web address]  Consent: (Patient) [name] provided verbal consent for this virtual visit at the beginning of the encounter.  Current Medications:  Current Outpatient Medications:    benzonatate (TESSALON) 100 MG capsule, Take 1 capsule (100 mg total) by mouth 3 (three) times daily as needed., Disp: 30 capsule, Rfl: 0   nirmatrelvir/ritonavir, renal dosing, (PAXLOVID) 10 x 150 MG & 10 x 100MG  TABS, Take 2 tablets by mouth 2 (two) times daily for 5 days. (Take nirmatrelvir 150 mg one tablet twice daily for 5 days and ritonavir 100 mg one tablet twice daily for 5 days) Patient GFR is 55.88, Disp: 20 tablet, Rfl: 0   Accu-Chek Softclix Lancets lancets, CHECK BLOOD GLUCOSE UP TO 2 TIMES A DAY, Disp: 100 each, Rfl: 12   acetaminophen  (TYLENOL ) 500 MG tablet, Take 1,000 mg by mouth 2 (two) times daily as needed for moderate pain or headache., Disp: , Rfl:    albuterol  (VENTOLIN  HFA) 108 (90 Base) MCG/ACT inhaler, Inhale 2 puffs every 4-6 hours as needed - rescue., Disp: 18 g, Rfl: 12   amoxicillin -clavulanate (AUGMENTIN ) 875-125 MG tablet, Take 1 tablet by mouth 2 (two) times daily for 10 days., Disp: 20 tablet, Rfl: 0   blood glucose meter kit and supplies, Dispense based on patient and insurance preference. Use up to four times daily as directed. (FOR ICD-10 E10.9, E11.9)., Disp: 1 each, Rfl: 0   budesonide -formoterol  (SYMBICORT ) 160-4.5 MCG/ACT inhaler, Inhale 2 puffs into the lungs 2  (two) times daily. As needed, Disp: 1 each, Rfl: 5   busPIRone  (BUSPAR ) 7.5 MG tablet, Take 1 tablet (7.5 mg total) by mouth 3 (three) times daily., Disp: 270 tablet, Rfl: 1   carvedilol  (COREG ) 25 MG tablet, TAKE ONE TABLET TWICE DAILY WITH MEAL(S) (Patient taking differently: Take 12.5 mg by mouth 2 (two) times daily with a meal.), Disp: 180 tablet, Rfl: 1   cetirizine  (ZYRTEC ) 10 MG tablet, Take 1 tablet (10 mg total) by mouth daily., Disp: 100 tablet, Rfl: 0   cholecalciferol (VITAMIN D3) 25 MCG (1000 UNIT) tablet, Take 2 tablets (2,000 Units total) by mouth daily., Disp: 100 tablet, Rfl: 0   Continuous Glucose Sensor (FREESTYLE LIBRE 3 PLUS SENSOR) MISC, CHANGE EVERY 15 DAYS TO CONTIUOUSLY CHECK BLOOD GLUCOSE, Disp: 2 each, Rfl: 2   diltiazem  (CARDIZEM  CD) 180 MG 24 hr capsule, TAKE ONE CAPSULE BY MOUTH DAILY, Disp: 90 capsule, Rfl: 1   escitalopram  (LEXAPRO ) 20 MG tablet, Take 1 tablet (20 mg total) by mouth in the morning AND 0.5 tablets (10 mg total) every evening., Disp: 135 tablet, Rfl: 1   esomeprazole  (NEXIUM ) 40 MG capsule, TAKE ONE CAPSULE ONCE DAILY, Disp: 90 capsule, Rfl: 1   ezetimibe  (ZETIA ) 10 MG tablet, TAKE ONE TABLET ONCE DAILY, Disp: 90 tablet, Rfl: 1   fenofibrate  (TRICOR ) 145 MG tablet, TAKE ONE TABLET ONCE DAILY, Disp: 90 tablet, Rfl: 1   ferrous sulfate  325 (65 FE) MG EC tablet, Take 325 mg by mouth every other  day., Disp: , Rfl:    fluticasone  (FLONASE ) 50 MCG/ACT nasal spray, Place 2 sprays into both nostrils daily., Disp: 16 g, Rfl: 6   furosemide  (LASIX ) 20 MG tablet, Take 1 tablet (20 mg total) by mouth daily., Disp: 90 tablet, Rfl: 1   glucose blood (ACCU-CHEK GUIDE TEST) test strip, CHECK BLOOD SUGAR TWICE DAILY AS DIRECTED, Disp: 100 strip, Rfl: 12   hydrALAZINE  (APRESOLINE ) 10 MG tablet, Take 1 tablet (10 mg total) by mouth 3 (three) times daily., Disp: 270 tablet, Rfl: 0   insulin  glargine (LANTUS ) 100 UNIT/ML Solostar Pen, Inject 10 Units into the skin daily.,  Disp: , Rfl:    Insulin  Pen Needle (GNP ULTICARE PEN NEEDLES) 32G X 4 MM MISC, Use w/ Lantus , Disp: 100 each, Rfl: 3   isosorbide  dinitrate (ISORDIL ) 10 MG tablet, Take 1 tablet (10 mg total) by mouth 3 (three) times daily., Disp: 90 tablet, Rfl: 5   levETIRAcetam  (KEPPRA ) 1000 MG tablet, Take 1 tablet (1,000 mg total) by mouth 2 (two) times daily., Disp: 180 tablet, Rfl: 1   levothyroxine  (SYNTHROID ) 200 MCG tablet, Take 1 tablet by mouth 6 days weekly (skip 1 day), Disp: , Rfl:    losartan  (COZAAR ) 100 MG tablet, Take 1 tablet (100 mg total) by mouth daily., Disp: 90 tablet, Rfl: 1   meclizine  (ANTIVERT ) 25 MG tablet, Take 1 tablet (25 mg total) by mouth 3 (three) times daily as needed for dizziness., Disp: 30 tablet, Rfl: 1   metFORMIN  (GLUCOPHAGE -XR) 500 MG 24 hr tablet, TAKE 4 TABLETS DAILY WITH SUPPER, Disp: 360 tablet, Rfl: 1   methylPREDNISolone  (MEDROL  DOSEPAK) 4 MG TBPK tablet, As directed, Disp: 21 tablet, Rfl: 0   montelukast  (SINGULAIR ) 10 MG tablet, TAKE ONE TABLET AT BEDTIME, Disp: 90 tablet, Rfl: 1   ondansetron  (ZOFRAN -ODT) 4 MG disintegrating tablet, Take 1 tablet (4 mg total) by mouth 2 (two) times daily as needed for nausea or vomiting., Disp: , Rfl:    potassium chloride  SA (KLOR-CON  M) 20 MEQ tablet, Take 1 tablet (20 mEq total) by mouth 2 (two) times daily., Disp: 180 tablet, Rfl: 1   rosuvastatin  (CRESTOR ) 40 MG tablet, TAKE ONE TABLET DAILY, Disp: 90 tablet, Rfl: 1   Semaglutide , 1 MG/DOSE, (OZEMPIC , 1 MG/DOSE,) 4 MG/3ML SOPN, Inject 1 mg into the skin once a week., Disp: 9 mL, Rfl: 3   spironolactone  (ALDACTONE ) 25 MG tablet, Take 1 tablet (25 mg total) by mouth daily., Disp: 90 tablet, Rfl: 0  Current Facility-Administered Medications:    [START ON 10/05/2024] denosumab  (PROLIA ) injection 60 mg, 60 mg, Subcutaneous, Q6 months, Domenica Harlene LABOR, MD   Medications ordered in this encounter:  Meds ordered this encounter  Medications   nirmatrelvir/ritonavir, renal dosing,  (PAXLOVID) 10 x 150 MG & 10 x 100MG  TABS    Sig: Take 2 tablets by mouth 2 (two) times daily for 5 days. (Take nirmatrelvir 150 mg one tablet twice daily for 5 days and ritonavir 100 mg one tablet twice daily for 5 days) Patient GFR is 55.88    Dispense:  20 tablet    Refill:  0    Supervising Provider:   BLAISE ALEENE KIDD [[id number]]   benzonatate (TESSALON) 100 MG capsule    Sig: Take 1 capsule (100 mg total) by mouth 3 (three) times daily as needed.    Dispense:  30 capsule    Refill:  0    Supervising Provider:   BLAISE ALEENE KIDD [id number]     *If  you need refills on other medications prior to your next appointment, please contact your pharmacy*  Follow-Up: Call back or seek an in-person evaluation if the symptoms worsen or if the condition fails to improve as anticipated.  [facility] [phone]  Care Instructions:  HOLD ROSUVASTATIN  (CRESTOR ) for 10 days  Can take to lessen severity (if able): Vit C 500mg  twice daily Quercertin 250-500mg  twice daily Zinc 75-100mg  daily Melatonin 3-6 mg at bedtime Vit D3 1000-2000 IU daily Aspirin  81 mg daily with food Optional: Famotidine  20mg  daily Also can add tylenol /ibuprofen as needed for fevers and body aches May add Mucinex or Mucinex DM as needed for cough/congestion    Isolation Instructions: You are to isolate at home until you have been fever free for at least 24 hours without a fever-reducing medication, and symptoms have been steadily improving for 24 hours. At that time,  you can end isolation but need to mask for an additional 5 days.   If you must be around other household members who do not have symptoms, you need to make sure that both you and the family members are masking consistently with a high-quality mask.  If you note any worsening of symptoms despite treatment, please seek an in-person evaluation ASAP. If you note any significant shortness of breath or any chest pain, please seek ER evaluation.  Please do not delay care!   COVID-19: What to Do if You Are Sick If you test positive and are an older adult or someone who is at high risk of getting very sick from COVID-19, treatment may be available. Contact a healthcare provider right away after a positive test to determine if you are eligible, even if your symptoms are mild right now. You can also visit a Test to Treat location and, if eligible, receive a prescription from a provider. Don't delay: Treatment must be started within the first few days to be effective. If you have a fever, cough, or other symptoms, you might have COVID-19. Most people have mild illness and are able to recover at home. If you are sick: Keep track of your symptoms. If you have an emergency warning sign (including trouble breathing), call 911. Steps to help prevent the spread of COVID-19 if you are sick If you are sick with COVID-19 or think you might have COVID-19, follow the steps below to care for yourself and to help protect other people in your home and community. Stay home except to get medical care Stay home. Most people with COVID-19 have mild illness and can recover at home without medical care. Do not leave your home, except to get medical care. Do not visit public areas and do not go to places where you are unable to wear a mask. Take care of yourself. Get rest and stay hydrated. Take over-the-counter medicines, such as acetaminophen , to help you feel better. Stay in touch with your doctor. Call before you get medical care. Be sure to get care if you have trouble breathing, or have any other emergency warning signs, or if you think it is an emergency. Avoid public transportation, ride-sharing, or taxis if possible. Get tested If you have symptoms of COVID-19, get tested. While waiting for test results, stay away from others, including staying apart from those living in your household. Get tested as soon as possible after your symptoms start. Treatments may  be available for people with COVID-19 who are at risk for becoming very sick. Don't delay: Treatment must be started early  to be effective--some treatments must begin within 5 days of your first symptoms. Contact your healthcare provider right away if your test result is positive to determine if you are eligible. Self-tests are one of several options for testing for the virus that causes COVID-19 and may be more convenient than laboratory-based tests and point-of-care tests. Ask your healthcare provider or your local health department if you need help interpreting your test results. You can visit your state, tribal, local, and territorial health department's website to look for the latest local information on testing sites. Separate yourself from other people As much as possible, stay in a specific room and away from other people and pets in your home. If possible, you should use a separate bathroom. If you need to be around other people or animals in or outside of the home, wear a well-fitting mask. Tell your close contacts that they may have been exposed to COVID-19. An infected person can spread COVID-19 starting 48 hours (or 2 days) before the person has any symptoms or tests positive. By letting your close contacts know they may have been exposed to COVID-19, you are helping to protect everyone. See COVID-19 and Animals if you have questions about pets. If you are diagnosed with COVID-19, someone from the health department may call you. Answer the call to slow the spread. Monitor your symptoms Symptoms of COVID-19 include fever, cough, or other symptoms. Follow care instructions from your healthcare provider and local health department. Your local health authorities may give instructions on checking your symptoms and reporting information. When to seek emergency medical attention Look for emergency warning signs* for COVID-19. If someone is showing any of these signs, seek emergency medical care  immediately: Trouble breathing Persistent pain or pressure in the chest New confusion Inability to wake or stay awake Pale, gray, or blue-colored skin, lips, or nail beds, depending on skin tone *This list is not all possible symptoms. Please call your medical provider for any other symptoms that are severe or concerning to you. Call 911 or call ahead to your local emergency facility: Notify the operator that you are seeking care for someone who has or may have COVID-19. Call ahead before visiting your doctor Call ahead. Many medical visits for routine care are being postponed or done by phone or telemedicine. If you have a medical appointment that cannot be postponed, call your doctor's office, and tell them you have or may have COVID-19. This will help the office protect themselves and other patients. If you are sick, wear a well-fitting mask You should wear a mask if you must be around other people or animals, including pets (even at home). Wear a mask with the best fit, protection, and comfort for you. You don't need to wear the mask if you are alone. If you can't put on a mask (because of trouble breathing, for example), cover your coughs and sneezes in some other way. Try to stay at least 6 feet away from other people. This will help protect the people around you. Masks should not be placed on young children under age 45 years, anyone who has trouble breathing, or anyone who is not able to remove the mask without help. Cover your coughs and sneezes Cover your mouth and nose with a tissue when you cough or sneeze. Throw away used tissues in a lined trash can. Immediately wash your hands with soap and water for at least 20 seconds. If soap and water are not available, clean your hands with  an alcohol-based hand sanitizer that contains at least 60% alcohol. Clean your hands often Wash your hands often with soap and water for at least 20 seconds. This is especially important after blowing your  nose, coughing, or sneezing; going to the bathroom; and before eating or preparing food. Use hand sanitizer if soap and water are not available. Use an alcohol-based hand sanitizer with at least 60% alcohol, covering all surfaces of your hands and rubbing them together until they feel dry. Soap and water are the best option, especially if hands are visibly dirty. Avoid touching your eyes, nose, and mouth with unwashed hands. Handwashing Tips Avoid sharing personal household items Do not share dishes, drinking glasses, cups, eating utensils, towels, or bedding with other people in your home. Wash these items thoroughly after using them with soap and water or put in the dishwasher. Clean surfaces in your home regularly Clean and disinfect high-touch surfaces (for example, doorknobs, tables, handles, light switches, and countertops) in your sick room and bathroom. In shared spaces, you should clean and disinfect surfaces and items after each use by the person who is ill. If you are sick and cannot clean, a caregiver or other person should only clean and disinfect the area around you (such as your bedroom and bathroom) on an as needed basis. Your caregiver/other person should wait as long as possible (at least several hours) and wear a mask before entering, cleaning, and disinfecting shared spaces that you use. Clean and disinfect areas that may have blood, stool, or body fluids on them. Use household cleaners and disinfectants. Clean visible dirty surfaces with household cleaners containing soap or detergent. Then, use a household disinfectant. Use a product from [employer] List N: Disinfectants for Coronavirus (COVID-19). Be sure to follow the instructions on the label to ensure safe and effective use of the product. Many products recommend keeping the surface wet with a disinfectant for a certain period of time (look at contact time on the product label). You may also need to wear personal protective  equipment, such as gloves, depending on the directions on the product label. Immediately after disinfecting, wash your hands with soap and water for 20 seconds. For completed guidance on cleaning and disinfecting your home, visit Complete Disinfection Guidance. Take steps to improve ventilation at home Improve ventilation (air flow) at home to help prevent from spreading COVID-19 to other people in your household. Clear out COVID-19 virus particles in the air by opening windows, using air filters, and turning on fans in your home. Use this interactive tool to learn how to improve air flow in your home. When you can be around others after being sick with COVID-19 Deciding when you can be around others is different for different situations. Find out when you can safely end home isolation. For any additional questions about your care, contact your healthcare provider or state or local health department. 09/12/2020 Content source: [facility] ([facility]), Division of Viral Diseases This information is not intended to replace advice given to you by your health care provider. Make sure you discuss any questions you have with your health care provider. Document Revised: 10/26/2020 Document Reviewed: 10/26/2020 Elsevier Patient Education  2022 [employer].     If you have been instructed to have an in-person evaluation today at a local Urgent Care facility, please use the link below. It will take you to a list of all of our available [facility], including address, phone number and  hours of operation. Please do not delay care.  [facility]  If you or a family member do not have a primary care provider, use the link below to schedule a visit and establish care. When you choose a [facility] primary care physician or advanced practice provider, you gain a long-term partner in health. Find a Primary Care Provider  Learn more about  [facility]'s in-office and virtual care options:  - Get Care Now  "

## 2024-06-25 NOTE — Telephone Encounter (Signed)
 Call Type Triage / Clinical Caller Name [name] Caller Phone Number [phone] Relationship To Patient Daughter Return Phone Number [phone] (Primary) Chief Complaint Headache Reason for Call Symptomatic / Request for Health Information Initial Comment Caller states her mom hasn't been feeling well and was tested positive for covid. Sx drainage, coughing, headache, and sneezing. Translation No Nurse Assessment Nurse: Juanito, RN, [place] Date/Time (Eastern Time): 06/24/2024 10:34:08 AM Confirm and document reason for call. If symptomatic, describe symptoms. ---Caller states her mother tested + Covid this am via home test. Caller states she has been ill x 1 month, on abx for strep throat. Coughing this am, sneezing. No difficulty breathing, eating/drinking well, no fever. No vomiting, some nausea, no headache today.

## 2024-06-25 NOTE — Progress Notes (Signed)
 " Virtual Visit Consent   [name], you are scheduled for a virtual visit with a [facility] provider today. Just as with appointments in the office, your consent must be obtained to participate. Your consent will be active for this visit and any virtual visit you may have with one of our providers in the next 365 days. If you have a MyChart account, a copy of this consent can be sent to you electronically.  As this is a virtual visit, video technology does not allow for your provider to perform a traditional examination. This may limit your provider's ability to fully assess your condition. If your provider identifies any concerns that need to be evaluated in person or the need to arrange testing (such as labs, EKG, etc.), we will make arrangements to do so. Although advances in technology are sophisticated, we cannot ensure that it will always work on either your end or our end. If the connection with a video visit is poor, the visit may have to be switched to a telephone visit. With either a video or telephone visit, we are not always able to ensure that we have a secure connection.  By engaging in this virtual visit, you consent to the provision of healthcare and authorize for your insurance to be billed (if applicable) for the services provided during this visit. Depending on your insurance coverage, you may receive a charge related to this service.  I need to obtain your verbal consent now. Are you willing to proceed with your visit today? Courtlynn Holloman has provided verbal consent on 06/25/2024 for a virtual visit (video or telephone). [name], PA-C  Date: 06/25/2024 8:25 AM   Virtual Visit via Video Note   I, [name], connected with  [name]  ([id number], 12/14/1950) on 06/25/2024 at  8:00 AM EST by a video-enabled telemedicine application and verified that I am speaking with the correct person using two identifiers.  Location: Patient: Virtual  Visit Location Patient: Home Provider: Virtual Visit Location Provider: Home Office   I discussed the limitations of evaluation and management by telemedicine and the availability of in person appointments. The patient expressed understanding and agreed to proceed.    History of Present Illness: [name] is a 74 y.o. who identifies as a female who was assigned female at birth, and is being seen today for Covid.  HPI: URI  This is a new problem. The current episode started yesterday (Tested positive for Covid on at home test). The problem has been gradually worsening. There has been no fever. Associated symptoms include congestion, coughing, diarrhea (some yesterday), ear pain (right), headaches, a plugged ear sensation (right), rhinorrhea, sinus pain, sneezing and a sore throat (has had recurrent strep treated x 2). Pertinent negatives include no chest pain, nausea, vomiting or wheezing. Treatments tried: augmentin , tylenol . The treatment provided no relief.     Problems:  Patient Active Problem List   Diagnosis Date Noted   Osteoporosis 10/24/2022   Asthma 06/11/2022   IDA (iron deficiency anemia) 04/30/2022   Lower GI bleed 04/29/2022   Arthritis 04/15/2022   History of breast cancer 04/15/2022   History of PAT (paroxysmal atrial tachycardia) 04/15/2022   Orthostatic hypotension 04/15/2022   Pre-syncope 04/15/2022   Seasonal allergies 04/15/2022   Allergic rhinitis 11/09/2021   Hx of papillary thyroid  carcinoma 07/19/2021   Atypical chest pain 07/16/2021   Urinary incontinence 07/16/2021   Hypothyroidism 03/20/2021   Nausea 12/06/2020   Muscle  cramps 12/06/2020   H/O thyroidectomy 08/30/2020   CSF leak from nose 01/13/2020   Uncontrolled type 2 diabetes mellitus with hyperglycemia (HCC) 04/14/2018   Vitamin D  deficiency 03/30/2018   Dental infection 03/29/2018   Dysphagia 12/28/2017   Cough 12/28/2017   Peripheral neuropathy 09/22/2017   Lipoma 09/22/2017    Lymphedema 05/22/2017   Asthmatic bronchitis, mild intermittent, uncomplicated 04/22/2017   Breast cancer (HCC) 02/17/2017   Dysuria 02/17/2017   Insomnia 10/08/2016   Acquired dilation of ascending aorta and aortic root    Port catheter in place 08/01/2016   Malignant neoplasm of upper-outer quadrant of left female breast (HCC) 05/14/2016   PVC's (premature ventricular contractions) 05/02/2016   Neck mass 04/21/2016   LVH (left ventricular hypertrophy) due to hypertensive disease, with heart failure (HCC) 04/18/2016   Tonsillar hypertrophy 07/07/2015   Back pain 10/02/2014   Obesity 07/11/2014   S/P right TKA 05/10/2014   S/P knee replacement 05/10/2014   Atrial tachycardia, paroxysmal 02/22/2014   Type 2 diabetes mellitus with hyperglycemia (HCC) 12/31/2013   Chronic diastolic CHF (congestive heart failure) (HCC) 11/22/2013   CAD (coronary artery disease), native coronary artery 11/22/2013   Dyspnea on exertion 11/03/2013   Undiagnosed cardiac murmurs 09/05/2013   Preventative health care 09/05/2013   Musculoskeletal pain 11/10/2012   Vasomotor rhinitis 04/05/2012   Obstructive sleep apnea 04/05/2012   Anemia 04/05/2012   Elevated LFTs 04/05/2012   OA (osteoarthritis) of knee 04/05/2012   Hernia 10/13/2010   Hyperlipidemia 09/11/2007   Anxiety and depression 09/11/2007   Essential hypertension 09/11/2007   GERD 09/11/2007   RENAL CALCULUS 09/11/2007   RENAL CYST 09/11/2007   History of colonic polyps 09/11/2007    Allergies: Allergies[1] Medications: Current Medications[2]  Observations/Objective: Patient is well-developed, well-nourished in no acute distress.  Resting comfortably at home.  Head is normocephalic, atraumatic.  No labored breathing.  Speech is clear and coherent with logical content.  Patient is alert and oriented at baseline.    Assessment and Plan: 1. COVID-19 (Primary) - nirmatrelvir/ritonavir, renal dosing, (PAXLOVID) 10 x 150 MG & 10 x 100MG   TABS; Take 2 tablets by mouth 2 (two) times daily for 5 days. (Take nirmatrelvir 150 mg one tablet twice daily for 5 days and ritonavir 100 mg one tablet twice daily for 5 days) Patient GFR is 55.88  Dispense: 20 tablet; Refill: 0 - benzonatate (TESSALON) 100 MG capsule; Take 1 capsule (100 mg total) by mouth 3 (three) times daily as needed.  Dispense: 30 capsule; Refill: 0  - Continue OTC symptomatic management of choice - Will send OTC vitamins and supplement information through AVS - Paxlovid renal dose prescribed (HOLD Rosuvastatin /Crestor ) - Tessalon perles added for cough - Patient enrolled in MyChart symptom monitoring - Push fluids - Rest as needed - Discussed return precautions and when to seek in-person evaluation, sent via AVS as well   Follow Up Instructions: I discussed the assessment and treatment plan with the patient. The patient was provided an opportunity to ask questions and all were answered. The patient agreed with the plan and demonstrated an understanding of the instructions.  A copy of instructions were sent to the patient via MyChart unless otherwise noted below.    The patient was advised to call back or seek an in-person evaluation if the symptoms worsen or if the condition fails to improve as anticipated.    [name], PA-C     [1]  Allergies Allergen Reactions   Oxycodone  Other (See Comments)  Does not feel good. Makes her sleepy.  [2]  Current Outpatient Medications:    benzonatate (TESSALON) 100 MG capsule, Take 1 capsule (100 mg total) by mouth 3 (three) times daily as needed., Disp: 30 capsule, Rfl: 0   nirmatrelvir/ritonavir, renal dosing, (PAXLOVID) 10 x 150 MG & 10 x 100MG  TABS, Take 2 tablets by mouth 2 (two) times daily for 5 days. (Take nirmatrelvir 150 mg one tablet twice daily for 5 days and ritonavir 100 mg one tablet twice daily for 5 days) Patient GFR is 55.88, Disp: 20 tablet, Rfl: 0   Accu-Chek Softclix Lancets lancets, CHECK  BLOOD GLUCOSE UP TO 2 TIMES A DAY, Disp: 100 each, Rfl: 12   acetaminophen  (TYLENOL ) 500 MG tablet, Take 1,000 mg by mouth 2 (two) times daily as needed for moderate pain or headache., Disp: , Rfl:    albuterol  (VENTOLIN  HFA) 108 (90 Base) MCG/ACT inhaler, Inhale 2 puffs every 4-6 hours as needed - rescue., Disp: 18 g, Rfl: 12   amoxicillin -clavulanate (AUGMENTIN ) 875-125 MG tablet, Take 1 tablet by mouth 2 (two) times daily for 10 days., Disp: 20 tablet, Rfl: 0   blood glucose meter kit and supplies, Dispense based on patient and insurance preference. Use up to four times daily as directed. (FOR ICD-10 E10.9, E11.9)., Disp: 1 each, Rfl: 0   budesonide -formoterol  (SYMBICORT ) 160-4.5 MCG/ACT inhaler, Inhale 2 puffs into the lungs 2 (two) times daily. As needed, Disp: 1 each, Rfl: 5   busPIRone  (BUSPAR ) 7.5 MG tablet, Take 1 tablet (7.5 mg total) by mouth 3 (three) times daily., Disp: 270 tablet, Rfl: 1   carvedilol  (COREG ) 25 MG tablet, TAKE ONE TABLET TWICE DAILY WITH MEAL(S) (Patient taking differently: Take 12.5 mg by mouth 2 (two) times daily with a meal.), Disp: 180 tablet, Rfl: 1   cetirizine  (ZYRTEC ) 10 MG tablet, Take 1 tablet (10 mg total) by mouth daily., Disp: 100 tablet, Rfl: 0   cholecalciferol (VITAMIN D3) 25 MCG (1000 UNIT) tablet, Take 2 tablets (2,000 Units total) by mouth daily., Disp: 100 tablet, Rfl: 0   Continuous Glucose Sensor (FREESTYLE LIBRE 3 PLUS SENSOR) MISC, CHANGE EVERY 15 DAYS TO CONTIUOUSLY CHECK BLOOD GLUCOSE, Disp: 2 each, Rfl: 2   diltiazem  (CARDIZEM  CD) 180 MG 24 hr capsule, TAKE ONE CAPSULE BY MOUTH DAILY, Disp: 90 capsule, Rfl: 1   escitalopram  (LEXAPRO ) 20 MG tablet, Take 1 tablet (20 mg total) by mouth in the morning AND 0.5 tablets (10 mg total) every evening., Disp: 135 tablet, Rfl: 1   esomeprazole  (NEXIUM ) 40 MG capsule, TAKE ONE CAPSULE ONCE DAILY, Disp: 90 capsule, Rfl: 1   ezetimibe  (ZETIA ) 10 MG tablet, TAKE ONE TABLET ONCE DAILY, Disp: 90 tablet, Rfl: 1    fenofibrate  (TRICOR ) 145 MG tablet, TAKE ONE TABLET ONCE DAILY, Disp: 90 tablet, Rfl: 1   ferrous sulfate  325 (65 FE) MG EC tablet, Take 325 mg by mouth every other day., Disp: , Rfl:    fluticasone  (FLONASE ) 50 MCG/ACT nasal spray, Place 2 sprays into both nostrils daily., Disp: 16 g, Rfl: 6   furosemide  (LASIX ) 20 MG tablet, Take 1 tablet (20 mg total) by mouth daily., Disp: 90 tablet, Rfl: 1   glucose blood (ACCU-CHEK GUIDE TEST) test strip, CHECK BLOOD SUGAR TWICE DAILY AS DIRECTED, Disp: 100 strip, Rfl: 12   hydrALAZINE  (APRESOLINE ) 10 MG tablet, Take 1 tablet (10 mg total) by mouth 3 (three) times daily., Disp: 270 tablet, Rfl: 0   insulin  glargine (LANTUS ) 100 UNIT/ML Solostar Pen,  Inject 10 Units into the skin daily., Disp: , Rfl:    Insulin  Pen Needle (GNP ULTICARE PEN NEEDLES) 32G X 4 MM MISC, Use w/ Lantus , Disp: 100 each, Rfl: 3   isosorbide  dinitrate (ISORDIL ) 10 MG tablet, Take 1 tablet (10 mg total) by mouth 3 (three) times daily., Disp: 90 tablet, Rfl: 5   levETIRAcetam  (KEPPRA ) 1000 MG tablet, Take 1 tablet (1,000 mg total) by mouth 2 (two) times daily., Disp: 180 tablet, Rfl: 1   levothyroxine  (SYNTHROID ) 200 MCG tablet, Take 1 tablet by mouth 6 days weekly (skip 1 day), Disp: , Rfl:    losartan  (COZAAR ) 100 MG tablet, Take 1 tablet (100 mg total) by mouth daily., Disp: 90 tablet, Rfl: 1   meclizine  (ANTIVERT ) 25 MG tablet, Take 1 tablet (25 mg total) by mouth 3 (three) times daily as needed for dizziness., Disp: 30 tablet, Rfl: 1   metFORMIN  (GLUCOPHAGE -XR) 500 MG 24 hr tablet, TAKE 4 TABLETS DAILY WITH SUPPER, Disp: 360 tablet, Rfl: 1   methylPREDNISolone  (MEDROL  DOSEPAK) 4 MG TBPK tablet, As directed, Disp: 21 tablet, Rfl: 0   montelukast  (SINGULAIR ) 10 MG tablet, TAKE ONE TABLET AT BEDTIME, Disp: 90 tablet, Rfl: 1   ondansetron  (ZOFRAN -ODT) 4 MG disintegrating tablet, Take 1 tablet (4 mg total) by mouth 2 (two) times daily as needed for nausea or vomiting., Disp: , Rfl:     potassium chloride  SA (KLOR-CON  M) 20 MEQ tablet, Take 1 tablet (20 mEq total) by mouth 2 (two) times daily., Disp: 180 tablet, Rfl: 1   rosuvastatin  (CRESTOR ) 40 MG tablet, TAKE ONE TABLET DAILY, Disp: 90 tablet, Rfl: 1   Semaglutide , 1 MG/DOSE, (OZEMPIC , 1 MG/DOSE,) 4 MG/3ML SOPN, Inject 1 mg into the skin once a week., Disp: 9 mL, Rfl: 3   spironolactone  (ALDACTONE ) 25 MG tablet, Take 1 tablet (25 mg total) by mouth daily., Disp: 90 tablet, Rfl: 0  Current Facility-Administered Medications:    [START ON 10/05/2024] denosumab  (PROLIA ) injection 60 mg, 60 mg, Subcutaneous, Q6 months, Domenica Harlene LABOR, MD  "

## 2024-07-13 ENCOUNTER — Encounter: Payer: Self-pay | Admitting: Family Medicine

## 2024-07-13 DIAGNOSIS — R55 Syncope and collapse: Secondary | ICD-10-CM

## 2024-07-14 ENCOUNTER — Other Ambulatory Visit: Payer: Self-pay | Admitting: Family Medicine

## 2024-07-20 NOTE — Progress Notes (Unsigned)
 [name], female   DOB: April 26, 1951, 74 y.o.   MRN: [id number]  HPI: [name] is a 74 y.o.-year-old female, initially referred by her PCP, Dr. Domenica, returning for follow-up for DM2, dx in 2019 after her breast cancer treatment, insulin -dependent, uncontrolled, with complications (CAD, dCHF), h/o papillary thyroid  cancer and postsurgical hypothyroidism.  Last visit 9 months ago.  Interim history: No increased urination, blurry vision, nausea, chest pain. In the past, she lost 22 pounds in 2 months on a plant-based die. She gained few lbs since then.   DM2:  Reviewed HbA1c: Lab Results  Component Value Date   HGBA1C 7.2 (H) 05/31/2024   HGBA1C 7.0 (H) 02/11/2024   HGBA1C 7 09/04/2023   HGBA1C 7.4 (H) 03/05/2023   HGBA1C 6.2 (A) 10/24/2022   HGBA1C 5.8 (A) 06/20/2022   HGBA1C 6.3 (A) 02/14/2022   HGBA1C 9.1 (A) 11/05/2021   HGBA1C 8.6 (H) 07/16/2021   HGBA1C 8.7 (H) 03/20/2021   She was previously on: - Metformin  1000 mg 2x a day, with meals >> 2000 mg at night >> 1000 mg 2x a day - Glimepiride  1 mg with breakfast >> before breakfast - Ozempic  0.25 >> 0.5 >> 1 mg weekly - Tresiba  U200 14 >> 16 units daily - started 10/2021 She was previously on Ozempic   [facility].  He was tolerating it well but she had to stop it when she stopped going to the weight management clinic during the coronavirus pandemic.  We changed to: - Metformin  ER 2000 mg at dinnertime - Ozempic  1 mg weekly - PAP - Tresiba  U200 12 >> 14 units daily  Pt checks her sugars >4x a day:  Previously:   Lowest sugar was 70 >> 77 >> 68; + hypoglycemia awareness in the 70s. Highest sugar was 278 >> ... 264.  Glucometer: Accu-Chek  Pt's meals are: - Breakfast: eggs, sometimes sandwich with sliced turkey, yoghurt - Lunch: sandwich or taco with turkey - Dinner: green bean + onion soup sauce + potato + grilled chicken - Snacks: yasso bars, cucumber Diet  green tea/soda.  - no CKD, last BUN/creatinine:  Lab Results  Component Value Date   BUN 21 05/31/2024   BUN 16 02/11/2024   CREATININE 1.00 05/31/2024   CREATININE 0.72 02/11/2024   Lab Results  Component Value Date   MICRALBCREAT 24.3 02/11/2024  She is is on losartan  100 mg daily.  -+ HL; last set of lipids: Lab Results  Component Value Date   CHOL 132 05/31/2024   HDL 38.70 (L) 05/31/2024   LDLCALC 38 05/31/2024   LDLDIRECT 66.0 10/24/2022   TRIG 277.0 (H) 05/31/2024   CHOLHDL 3 05/31/2024  She is on Crestor  40 mg daily, Zetia  10 mg daily, Tricor  145 mg daily. She is not taking Vascepa .  - last eye exam was in 2024. No DR reportedly. she has a history of cataracts. She has blurry vision in L eye - may need to have cataract sx.  - no numbness and tingling in her feet.  Last foot exam 02/14/2022.  Pt has FH of DM in [facility].  She also has a history of postsurgical hypothyroidism:  She is on levothyroxine  250 (200?) mcg daily: - in am - fasting - at least 30-60 min from b'fast - no calcium  - no iron - no multivitamins - + PPIs (Nexium ) now >4h after Levothyroxine  - not on Biotin  Reviewed previous TFTs: Lab Results  Component Value Date  TSH 0.13 (L) 05/31/2024   TSH 0.03 (L) 03/25/2024   TSH 0.03 (L) 02/11/2024   TSH 2.18 07/17/2023   TSH 1.73 03/05/2023   TSH 0.61 10/24/2022   TSH 1.96 02/14/2022   TSH 5.18 08/31/2021   TSH 8.97 (H) 07/16/2021   TSH 7.93 (H) 06/22/2021   TSH 21.15 (H) 05/07/2021   TSH 21.89 (H) 03/20/2021   TSH 29.57 (H) 12/06/2020   TSH 12.31 (A) 08/04/2020   TSH 1.84 09/06/2019   TSH 2.130 03/26/2018   TSH 1.87 12/22/2017   TSH 0.81 09/22/2017   TSH 1.31 05/22/2017   TSH 1.84 02/17/2017   Lab Results  Component Value Date   FREET4 1.39 02/14/2022   FREET4 1.33 03/26/2018   No FH of thyroid  cancer. No h/o radiation tx to head or neck. No herbal supplements. No Biotin use. No recent steroids use.   She also has a history of  a goiter >> s/p thyroidectomy 06/2020 >> papillary thyroid  cancer  I reviewed the report of the neck CT (06/22/2020): Thyroid : Markedly diffusely enlarged and heterogeneous with multiple poorly defined nodules. This extends into the superior aspect of the superior mediastinum. At the thoracic inlet, this is causing moderate narrowing of the trachea with a transverse diameter of 9 mm. This measured 6 mm on the chest CTA.  A thyroid  U/S (04/29/2016) showed no individual nodules: Parenchymal Echotexture: Markedly heterogenous Estimated total number of nodules >/= 1 cm: 0 Isthmus: 1.3 cm; Heterogeneous parenchyma, enlarged. Right lobe: 11.1 cm x 5.2 cm x 4.6 cm; Heterogeneous lobulated parenchyma with no focal nodule. Left lobe: 9.0 cm x 3.6 cm x 6.0 cm; Heterogeneous lobulated parenchyma with no focal nodule.   IMPRESSION: Enlarged multilobulated thyroid  compatible with multinodular goiter and medical thyroid  disease. No focal nodule identified.  Total thyroidectomy (07/24/2020): A: Substernal thyroid , total substernal thyroidectomy - Multinodular goiter, clinically substernal. - Incidental microscopic papillary thyroid  carcinoma, 1 mm in greatest dimension, margins uninvolved. - See synoptic template below. - One lymph node from soft tissue adjacent to thyroid  gland with no tumor seen (0/1).   B: Thyroid , pyramidal lobe tissue, excision - Benign thyroid  tissue with adenomatous nodule, no tumor seen.   She also has a history of HTN, GERD, osteoarthritis, anxiety/depression, vitamin D  deficiency, breast cancer, urinary incontinence, frequency, urgency, transaminitis, OSA -on CPAP. No personal h/o pancreatitis or FH of MTC. She had CSF repair sx.  ROS: + See HPI, also: ENT: + hypoacusis  Past Medical History:  Diagnosis Date   Acquired dilation of ascending aorta and aortic root    Ascending arctic 43 mm and aortic root 40 mm by chest CTA 09/2022   Anemia 04/05/2012   Anxiety     Anxiety state 09/11/2007   Qualifier: Diagnosis of  By: Krystal RN, Leeroy     Asthma    Atrial tachycardia, paroxysmal    Back pain 10/02/2014   Breast cancer (HCC) 02/17/2017   CAD (coronary artery disease), native coronary artery    remote Cath with nonobstructive ASCAD with 20% mid LAD, 20% prox left circ and 20% ostial RCA   Chicken pox as a child   Chronic diastolic CHF (congestive heart failure), NYHA class 1 (HCC)    Complication of anesthesia    pt states feels different in her body after anesthesia when waking up and also experiences a smell of burnt plastic for approx a wk    Constipation    Depression    Diabetes (HCC)    Dilated aortic  root    42mm by echo 08/2020   Dysuria 02/17/2017   Elevated LFTs 04/05/2012   GERD (gastroesophageal reflux disease)    Goiter    Heart murmur    hx of one at birth    History of kidney stones    History of radiation therapy 10/31/16-12/17/16   left breast 45 Gy in 25 fractions, left breast boost 16 Gy in 8 fractions   Hx of colonic polyps    Hyperlipidemia    Hypertension    Insomnia 10/08/2016   Joint pain    Malignant neoplasm of upper-outer quadrant of left female breast (HCC) 05/14/2016   not with patient   Measles as a child   Mumps as a child   OA (osteoarthritis) of knee 04/05/2012   Obesity 07/11/2014   Personal history of chemotherapy    Personal history of radiation therapy    PONV (postoperative nausea and vomiting)    Preventative health care 09/05/2013   PVC's (premature ventricular contractions) 05/02/2016   Shortness of breath dyspnea    walking distances / climbing stairs   Sleep apnea 04/05/2012   Tinea corporis 02/23/2013   Type 2 diabetes mellitus with hyperglycemia (HCC) 12/31/2013   Vasomotor rhinitis 04/05/2012   Ventral hernia    Wheezing    Past Surgical History:  Procedure Laterality Date   BREAST LUMPECTOMY Left 2017   BREAST LUMPECTOMY WITH RADIOACTIVE SEED AND SENTINEL LYMPH NODE BIOPSY Left  06/21/2016   Procedure: LEFT BREAST LUMPECTOMY WITH RADIOACTIVE SEED AND SENTINEL LYMPH NODE BIOPSY, INJECT BLUE DYE LEFT BREAST;  Surgeon: Elon Pacini, MD;  Location: [facility] OR;  Service: General;  Laterality: Left;   CARDIAC CATHETERIZATION     normal coroary arteries per patient   CARDIOVASCULAR STRESS TEST     10/12/2013   CESAREAN SECTION     X 3   CHOLECYSTECTOMY     COLONOSCOPY WITH PROPOFOL  N/A 03/28/2015   Procedure: COLONOSCOPY WITH PROPOFOL ;  Surgeon: Renaye Sous, MD;  Location: [facility] ENDOSCOPY;  Service: Endoscopy;  Laterality: N/A;   HERNIA REPAIR  02/08/2011   ventral hernia   JOINT REPLACEMENT     bilateral   KNEE ARTHROSCOPY  05/2010   bilateral   LEFT AND RIGHT HEART CATHETERIZATION WITH CORONARY ANGIOGRAM N/A 11/08/2013   Procedure: LEFT AND RIGHT HEART CATHETERIZATION WITH CORONARY ANGIOGRAM;  Surgeon: Lonni JONETTA Cash, MD;  Location: [facility] CATH LAB;  Service: Cardiovascular;  Laterality: N/A;   MENISCUS REPAIR  2009   MOUTH SURGERY     teeth implants   PORT-A-CATH REMOVAL N/A 01/24/2017   Procedure: REMOVAL PORT-A-CATH;  Surgeon: Pacini Elon, MD;  Location[facility] OR;  Service: General;  Laterality: N/A;   PORTACATH PLACEMENT Right 07/16/2016   Procedure: INSERTION PORT-A-CATH RIGHT INTERNAL JUGULAR WITH ULTRASOUND;  Surgeon: Elon Pacini, MD;  Location: [facility] OR;  Service: General;  Laterality: Right;   REPAIR DURAL / CSF LEAK  2021   performed at [facility] in [place]   TOTAL KNEE ARTHROPLASTY  2011   left   TOTAL KNEE ARTHROPLASTY Right 05/10/2014   Procedure: RIGHT TOTAL KNEE ARTHROPLASTY;  Surgeon: Donnice JONETTA Car, MD;  Location: [facility] ORS;  Service: Orthopedics;  Laterality: Right;   TOTAL THYROIDECTOMY Bilateral 2022   TUBAL LIGATION     WISDOM TOOTH EXTRACTION  2000   Social History   Socioeconomic History   Marital status: Divorced    Spouse name: Not on file   Number of children: 3   Years of  education: Not on file   Highest education  level: 12th grade  Occupational History   Occupation: retired - [place]  Tobacco Use   Smoking status: Never   Smokeless tobacco: Never  Vaping Use   Vaping status: Never Used  Substance and Sexual Activity   Alcohol use: Not Currently    Comment: rarely   Drug use: No   Sexual activity: Never  Other Topics Concern   Not on file  Social History Narrative   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)   Social Drivers of Health   Tobacco Use: Low Risk (06/25/2024)   Patient History    Smoking Tobacco Use: Never    Smokeless Tobacco Use: Never    Passive Exposure: Not on file  Financial Resource Strain: Low Risk (01/07/2024)   Overall Financial Resource Strain (CARDIA)    Difficulty of Paying Living Expenses: Not hard at all  Food Insecurity: No Food Insecurity (01/07/2024)   Epic    Worried About Programme Researcher, Broadcasting/film/video in the Last Year: Never true    Ran Out of Food in the Last Year: Never true  Transportation Needs: No Transportation Needs (01/07/2024)   Epic    Lack of Transportation (Medical): No    Lack of Transportation (Non-Medical): No  Physical Activity: Inactive (01/07/2024)   Exercise Vital Sign    Days of Exercise per Week: 0 days    Minutes of Exercise per Session: Not on file  Stress: Stress Concern Present (01/07/2024)   [employer] of Occupational Health - Occupational Stress Questionnaire    Feeling of Stress: To some extent  Social Connections: Moderately Integrated (01/07/2024)   Social Connection and Isolation Panel    Frequency of Communication with Friends and Family: More than three times a week    Frequency of Social Gatherings with Friends and Family: More than three times a week    Attends Religious Services: More than 4 times per year    Active Member of [employer] or Organizations: Yes    Attends Banker Meetings: More than 4 times per year    Marital Status: Divorced  Intimate Partner Violence: Not At Risk (08/19/2023)    Humiliation, Afraid, Rape, and Kick questionnaire    Fear of Current or Ex-Partner: No    Emotionally Abused: No    Physically Abused: No    Sexually Abused: No  Depression (PHQ2-9): Low Risk (05/31/2024)   Depression (PHQ2-9)    PHQ-2 Score: 0  Alcohol Screen: Low Risk (01/07/2024)   Alcohol Screen    Last Alcohol Screening Score (AUDIT): 0  Housing: Low Risk (01/07/2024)   Epic    Unable to Pay for Housing in the Last Year: No    Number of Times Moved in the Last Year: 0    Homeless in the Last Year: No  Utilities: Not At Risk (08/19/2023)   AHC Utilities    Threatened with loss of utilities: No  Health Literacy: Low Risk (10/17/2023)   Received from [facility] Literacy    How often do you need to have someone help you when you read instructions, pamphlets, or other written material from your doctor or pharmacy?: Never   Current Outpatient Medications on File Prior to Visit  Medication Sig Dispense Refill   Accu-Chek Softclix Lancets lancets CHECK BLOOD GLUCOSE UP TO 2 TIMES A DAY 100 each 12   acetaminophen  (TYLENOL ) 500 MG tablet Take 1,000 mg by mouth 2 (two)  times daily as needed for moderate pain or headache.     albuterol  (VENTOLIN  HFA) 108 (90 Base) MCG/ACT inhaler Inhale 2 puffs every 4-6 hours as needed - rescue. 18 g 12   benzonatate  (TESSALON ) 100 MG capsule Take 1 capsule (100 mg total) by mouth 3 (three) times daily as needed. 30 capsule 0   blood glucose meter kit and supplies Dispense based on patient and insurance preference. Use up to four times daily as directed. (FOR ICD-10 E10.9, E11.9). 1 each 0   budesonide -formoterol  (SYMBICORT ) 160-4.5 MCG/ACT inhaler Inhale 2 puffs into the lungs 2 (two) times daily. As needed 1 each 5   busPIRone  (BUSPAR ) 7.5 MG tablet Take 1 tablet (7.5 mg total) by mouth 3 (three) times daily. 270 tablet 1   carvedilol  (COREG ) 25 MG tablet TAKE ONE TABLET TWICE DAILY WITH MEAL(S) (Patient taking differently: Take 12.5 mg by  mouth 2 (two) times daily with a meal.) 180 tablet 1   cetirizine  (ZYRTEC ) 10 MG tablet Take 1 tablet (10 mg total) by mouth daily. 100 tablet 0   cholecalciferol (VITAMIN D3) 25 MCG (1000 UNIT) tablet Take 2 tablets (2,000 Units total) by mouth daily. 100 tablet 0   Continuous Glucose Sensor (FREESTYLE LIBRE 3 PLUS SENSOR) MISC CHANGE EVERY 15 DAYS TO CONTIUOUSLY CHECK BLOOD GLUCOSE 2 each 2   diltiazem  (CARDIZEM  CD) 180 MG 24 hr capsule TAKE ONE CAPSULE BY MOUTH DAILY 90 capsule 1   escitalopram  (LEXAPRO ) 20 MG tablet Take 1 tablet (20 mg total) by mouth in the morning AND 0.5 tablets (10 mg total) every evening. 135 tablet 1   esomeprazole  (NEXIUM ) 40 MG capsule TAKE ONE CAPSULE ONCE DAILY 90 capsule 1   ezetimibe  (ZETIA ) 10 MG tablet TAKE ONE TABLET ONCE DAILY 90 tablet 1   fenofibrate  (TRICOR ) 145 MG tablet TAKE ONE TABLET ONCE DAILY 90 tablet 1   ferrous sulfate  325 (65 FE) MG EC tablet Take 325 mg by mouth every other day.     fluticasone  (FLONASE ) 50 MCG/ACT nasal spray Place 2 sprays into both nostrils daily. 16 g 6   furosemide  (LASIX ) 20 MG tablet Take 1 tablet (20 mg total) by mouth daily. 90 tablet 1   glucose blood (ACCU-CHEK GUIDE TEST) test strip CHECK BLOOD SUGAR TWICE DAILY AS DIRECTED 100 strip 12   hydrALAZINE  (APRESOLINE ) 10 MG tablet Take 1 tablet (10 mg total) by mouth 3 (three) times daily. 270 tablet 0   insulin  glargine (LANTUS ) 100 UNIT/ML Solostar Pen Inject 10 Units into the skin daily.     Insulin  Pen Needle (GNP ULTICARE PEN NEEDLES) 32G X 4 MM MISC Use w/ Lantus  100 each 3   isosorbide  dinitrate (ISORDIL ) 10 MG tablet Take 1 tablet (10 mg total) by mouth 3 (three) times daily. 90 tablet 5   levETIRAcetam  (KEPPRA ) 1000 MG tablet Take 1 tablet (1,000 mg total) by mouth 2 (two) times daily. 180 tablet 1   levothyroxine  (SYNTHROID ) 200 MCG tablet Take 1 tablet by mouth 6 days weekly (skip 1 day)     losartan  (COZAAR ) 100 MG tablet Take 1 tablet (100 mg total) by mouth  daily. 90 tablet 1   meclizine  (ANTIVERT ) 25 MG tablet Take 1 tablet (25 mg total) by mouth 3 (three) times daily as needed for dizziness. 30 tablet 1   metFORMIN  (GLUCOPHAGE -XR) 500 MG 24 hr tablet TAKE 4 TABLETS DAILY WITH SUPPER 360 tablet 1   methylPREDNISolone  (MEDROL  DOSEPAK) 4 MG TBPK tablet As directed 21 tablet  0   montelukast  (SINGULAIR ) 10 MG tablet TAKE ONE TABLET AT BEDTIME 90 tablet 1   ondansetron  (ZOFRAN -ODT) 4 MG disintegrating tablet Take 1 tablet (4 mg total) by mouth 2 (two) times daily as needed for nausea or vomiting.     potassium chloride  SA (KLOR-CON  M) 20 MEQ tablet Take 1 tablet (20 mEq total) by mouth 2 (two) times daily. 180 tablet 1   rosuvastatin  (CRESTOR ) 40 MG tablet TAKE ONE TABLET DAILY 90 tablet 1   Semaglutide , 1 MG/DOSE, (OZEMPIC , 1 MG/DOSE,) 4 MG/3ML SOPN Inject 1 mg into the skin once a week. 9 mL 3   spironolactone  (ALDACTONE ) 25 MG tablet Take 1 tablet (25 mg total) by mouth daily. 90 tablet 0   Current Facility-Administered Medications on File Prior to Visit  Medication Dose Route Frequency Provider Last Rate Last Admin   [START ON 10/05/2024] denosumab  (PROLIA ) injection 60 mg  60 mg Subcutaneous Q6 months Domenica Harlene LABOR, MD       Allergies  Allergen Reactions   Oxycodone  Other (See Comments)    Does not feel good. Makes her sleepy.    Family History  Problem Relation Age of Onset   Thyroid  disease Mother        hypothyroid   Hyperlipidemia Mother    Depression Mother    Anxiety disorder Mother    Heart attack Father 65   Hypertension Father    Arthritis Father        RA   Coronary artery disease Father    High Cholesterol Father    Colon cancer Maternal Aunt    Colon cancer Maternal Grandmother    Heart attack Maternal Grandfather    Alcohol abuse Paternal Grandfather    Coronary artery disease Brother    Heart disease Brother    Breast cancer Neg Hx    BRCA 1/2 Neg Hx    PE: There were no vitals taken for this visit. Wt  Readings from Last 3 Encounters:  06/16/24 272 lb 6.4 oz (123.6 kg)  05/31/24 273 lb (123.8 kg)  09/11/23 294 lb 1.5 oz (133.4 kg)   Constitutional: overweight, in NAD Eyes: no exophthalmos ENT: no masses palpated in neck, no cervical lymphadenopathy Cardiovascular: RRR, No MRG, + B periankle edema Respiratory: CTA B Musculoskeletal: no deformities Skin: no rashes Neurological: no tremor with outstretched hands  ASSESSMENT: 1. DM2, insulin -dependent, uncontrolled, with complications - CAD - dCHF  2.  Postsurgical hypothyroidism  3.  Microscopic papillary thyroid  cancer  PLAN:  1. Patient with longstanding, uncontrolled, type 2 diabetes, returning after a longer absence of 9 months.  She is on metformin , weekly GLP-1 receptor agonist and daily long-acting insulin .  At last visit, on this regimen, sugars were fluctuating within the target range with only occasional mild hyperglycemic exceptions.  Overall, they were slightly higher than expected from the HbA1c and we discussed that she could increase the dose of the long-acting insulin  if this trend continues.  Otherwise we did not change the regimen.  She was still on a plant-based diet but she mentions that she was really missing eggs.  She was contemplating having an ache every once in a while.  I advised her that this was ok. - Latest HbA1c was slightly higher last month, at 7.2%. CGM interpretation: -At today's visit, we reviewed her CGM downloads: It appears that *** of values are in target range (goal >70%), while *** are higher than 180 (goal <25%), and *** are lower than 70 (goal <4%).  The calculated average blood sugar is ***.  The projected HbA1c for the next 3 months (GMI) is ***. -Reviewing the CGM trends, ***  -I advised her to: Patient Instructions  Please continue: - Metformin  ER 2000 mg at dinnertime - Ozempic  1 mg weekly in a.m.  - Tresiba  U200 12 units daily (may increase to 14 units if sugars stay  elevated)  Continue Levothyroxine  250 mcg daily.  Take the thyroid  hormone every day, with water, at least 30 minutes before breakfast, separated by at least 4 hours from: - acid reflux medications - calcium  - iron - multivitamins  Please return in 4 months.  - we checked her HbA1c: 7%  - advised to check sugars at different times of the day - 4x a day, rotating check times - advised for yearly eye exams >> she is UTD - return to clinic in 4 months  2.  Postsurgical hypothyroidism - latest thyroid  labs reviewed with pt. >> patient was recently suppressed, Lab Results  Component Value Date   TSH 0.13 (L) 05/31/2024  - she continues on LT4 250 mcg daily - chewed for better absorption - pt feels good on this dose. - we discussed about taking the thyroid  hormone every day, with water, >30 minutes before breakfast, separated by >4 hours from acid reflux medications, calcium , iron, multivitamins. Pt. is taking it correctly. - will check thyroid  tests today: TSH and fT4 - If labs are abnormal, she will need to return for repeat TFTs in 1.5 months  3.  Microscopic papillary thyroid  cancer - She had 1 focus of 1 mm papillary thyroid  cancer per review of records from [facility].  This was incidentally discovered on pathology after removal of compressive goiter - I discussed with the patient that she was most likely cured - Neck compression symptoms or masses felt on palpation of her neck today - Will continue to monitor her clinically, no imaging needed for now  Lela Fendt, MD PhD [facility] Endocrinology

## 2024-07-21 ENCOUNTER — Ambulatory Visit: Admitting: Internal Medicine

## 2024-07-21 ENCOUNTER — Other Ambulatory Visit: Payer: Self-pay

## 2024-07-22 ENCOUNTER — Telehealth: Payer: Self-pay

## 2024-07-22 ENCOUNTER — Other Ambulatory Visit: Payer: Self-pay | Admitting: Family Medicine

## 2024-07-22 NOTE — Telephone Encounter (Signed)
 Noted.

## 2024-07-22 NOTE — Telephone Encounter (Signed)
 I have called the number in the patients chart to come pick up her medication that is in the Fridge no later than tomorrow before 5 pm. (07/23/24)  Rolin KRAFT

## 2024-07-22 NOTE — Telephone Encounter (Signed)
 Patient's daughter called regarding her patient [name] has appointment on 07/27/24 and would like to pick up that day if possible.

## 2024-07-26 ENCOUNTER — Encounter: Payer: Self-pay | Admitting: Internal Medicine

## 2024-07-27 ENCOUNTER — Telehealth: Admitting: Internal Medicine

## 2024-07-27 ENCOUNTER — Encounter: Payer: Self-pay | Admitting: Internal Medicine

## 2024-07-27 DIAGNOSIS — Z85850 Personal history of malignant neoplasm of thyroid: Secondary | ICD-10-CM

## 2024-07-27 DIAGNOSIS — E890 Postprocedural hypothyroidism: Secondary | ICD-10-CM

## 2024-07-27 DIAGNOSIS — Z794 Long term (current) use of insulin: Secondary | ICD-10-CM

## 2024-07-27 DIAGNOSIS — E1165 Type 2 diabetes mellitus with hyperglycemia: Secondary | ICD-10-CM

## 2024-07-27 DIAGNOSIS — E1159 Type 2 diabetes mellitus with other circulatory complications: Secondary | ICD-10-CM

## 2024-07-27 MED ORDER — OZEMPIC (0.25 OR 0.5 MG/DOSE) 2 MG/1.5ML ~~LOC~~ SOPN: 0.5000 mg | PEN_INJECTOR | SUBCUTANEOUS | 3 refills | Status: AC

## 2024-07-27 NOTE — Telephone Encounter (Signed)
 Spoke with patient and appointment changed.

## 2024-07-27 NOTE — Patient Instructions (Addendum)
 Please continue: - Metformin  ER 1000 mg 2x day  Try to restart: - Ozempic  0.25 mg weekly in a.m. and increase to 0.5 mg weekly after 2-4 weeks.  On the Ozempic  1 mg pen: - 18 clicks  - 0.25 mg - 36 clicks - 0.5 mg - 54 clicks - 0.75 mg - 72 clicks - 1 mg  Increase: - Tresiba  U200 12-14 units daily  Continue Levothyroxine  250 mcg daily.  Take the thyroid  hormone every day, with water, at least 30 minutes before breakfast, separated by at least 4 hours from: - acid reflux medications - calcium  - iron - multivitamins  Please return in 1.5 months.

## 2024-08-09 ENCOUNTER — Ambulatory Visit: Admitting: Family Medicine

## 2024-08-24 ENCOUNTER — Ambulatory Visit: Payer: Medicare Other
